# Patient Record
Sex: Male | Born: 1955 | Race: White | Hispanic: No | Marital: Married | Smoking: Never smoker
Health system: Southern US, Community
[De-identification: ages and names within clinical notes are randomized; demographics above are authoritative.]

## PROBLEM LIST (undated history)

## (undated) DIAGNOSIS — D649 Anemia, unspecified: Secondary | ICD-10-CM

## (undated) DIAGNOSIS — I2510 Atherosclerotic heart disease of native coronary artery without angina pectoris: Secondary | ICD-10-CM

## (undated) DIAGNOSIS — E785 Hyperlipidemia, unspecified: Secondary | ICD-10-CM

## (undated) DIAGNOSIS — D510 Vitamin B12 deficiency anemia due to intrinsic factor deficiency: Secondary | ICD-10-CM

## (undated) DIAGNOSIS — R011 Cardiac murmur, unspecified: Secondary | ICD-10-CM

## (undated) DIAGNOSIS — E119 Type 2 diabetes mellitus without complications: Secondary | ICD-10-CM

## (undated) DIAGNOSIS — F988 Other specified behavioral and emotional disorders with onset usually occurring in childhood and adolescence: Secondary | ICD-10-CM

## (undated) DIAGNOSIS — M1990 Unspecified osteoarthritis, unspecified site: Secondary | ICD-10-CM

## (undated) DIAGNOSIS — J45990 Exercise induced bronchospasm: Secondary | ICD-10-CM

## (undated) DIAGNOSIS — E291 Testicular hypofunction: Secondary | ICD-10-CM

## (undated) DIAGNOSIS — R112 Nausea with vomiting, unspecified: Secondary | ICD-10-CM

## (undated) DIAGNOSIS — I10 Essential (primary) hypertension: Secondary | ICD-10-CM

## (undated) DIAGNOSIS — Z9889 Other specified postprocedural states: Secondary | ICD-10-CM

## (undated) DIAGNOSIS — M545 Low back pain, unspecified: Secondary | ICD-10-CM

## (undated) HISTORY — DX: Low back pain: M54.5

## (undated) HISTORY — DX: Other specified behavioral and emotional disorders with onset usually occurring in childhood and adolescence: F98.8

## (undated) HISTORY — DX: Anemia, unspecified: D64.9

## (undated) HISTORY — DX: Unspecified osteoarthritis, unspecified site: M19.90

## (undated) HISTORY — DX: Exercise induced bronchospasm: J45.990

## (undated) HISTORY — DX: Vitamin B12 deficiency anemia due to intrinsic factor deficiency: D51.0

## (undated) HISTORY — DX: Hyperlipidemia, unspecified: E78.5

## (undated) HISTORY — DX: Cardiac murmur, unspecified: R01.1

## (undated) HISTORY — DX: Low back pain, unspecified: M54.50

## (undated) HISTORY — PX: KNEE ARTHROSCOPY: SUR90

## (undated) HISTORY — DX: Testicular hypofunction: E29.1

---

## 2004-05-04 HISTORY — PX: ULNAR NERVE TRANSPOSITION: SHX2595

## 2005-05-04 HISTORY — PX: TENOTOMY ACHILLES TENDON: SUR1337

## 2013-10-10 ENCOUNTER — Emergency Department (HOSPITAL_COMMUNITY)
Admission: EM | Admit: 2013-10-10 | Discharge: 2013-10-10 | Disposition: A | Discharge status: Home / Self Care | Payer: 59 | Attending: Emergency Medicine | Admitting: Emergency Medicine

## 2013-10-10 ENCOUNTER — Encounter (HOSPITAL_COMMUNITY): Payer: Self-pay | Admitting: Emergency Medicine

## 2013-10-10 DIAGNOSIS — M545 Low back pain, unspecified: Secondary | ICD-10-CM

## 2013-10-10 DIAGNOSIS — M5430 Sciatica, unspecified side: Secondary | ICD-10-CM | POA: Insufficient documentation

## 2013-10-10 DIAGNOSIS — Z79899 Other long term (current) drug therapy: Secondary | ICD-10-CM | POA: Insufficient documentation

## 2013-10-10 DIAGNOSIS — E119 Type 2 diabetes mellitus without complications: Secondary | ICD-10-CM | POA: Insufficient documentation

## 2013-10-10 DIAGNOSIS — Z791 Long term (current) use of non-steroidal anti-inflammatories (NSAID): Secondary | ICD-10-CM | POA: Insufficient documentation

## 2013-10-10 HISTORY — DX: Type 2 diabetes mellitus without complications: E11.9

## 2013-10-10 MED ORDER — DEXAMETHASONE SODIUM PHOSPHATE 10 MG/ML IJ SOLN
10.0000 mg | Freq: Once | INTRAMUSCULAR | Status: AC
  Administered 2013-10-10: 10 mg via INTRAMUSCULAR
  Filled 2013-10-10: qty 1

## 2013-10-10 MED ORDER — DIAZEPAM 5 MG PO TABS: 5.0000 mg | ORAL_TABLET | Freq: Two times a day (BID) | ORAL | Status: DC

## 2013-10-10 MED ORDER — OXYCODONE-ACETAMINOPHEN 5-325 MG PO TABS: 1.0000 | ORAL_TABLET | Freq: Four times a day (QID) | ORAL | Status: DC | PRN

## 2013-10-10 MED ORDER — OXYCODONE-ACETAMINOPHEN 5-325 MG PO TABS
2.0000 | ORAL_TABLET | Freq: Once | ORAL | Status: AC
  Administered 2013-10-10: 2 via ORAL
  Filled 2013-10-10: qty 2

## 2013-10-10 MED ORDER — KETOROLAC TROMETHAMINE 60 MG/2ML IM SOLN
60.0000 mg | Freq: Once | INTRAMUSCULAR | Status: AC
  Administered 2013-10-10: 60 mg via INTRAMUSCULAR
  Filled 2013-10-10: qty 2

## 2013-10-10 MED ORDER — DIAZEPAM 5 MG/ML IJ SOLN
2.5000 mg | Freq: Once | INTRAMUSCULAR | Status: AC
  Administered 2013-10-10: 2.5 mg via INTRAMUSCULAR
  Filled 2013-10-10: qty 2

## 2013-10-10 MED ORDER — DIAZEPAM 5 MG/ML IJ SOLN: 2.5000 mg | Freq: Once | INTRAMUSCULAR | Status: DC

## 2013-10-10 MED ORDER — MELOXICAM 7.5 MG PO TABS: 15.0000 mg | ORAL_TABLET | Freq: Every day | ORAL | Status: DC

## 2013-10-10 NOTE — ED Notes (Signed)
Patient is alert and oriented x3.  He was given DC instructions and follow up visit instructions.  Patient gave verbal understanding.  He was DC ambulatory under his own power to home.  V/S stable.  He was not showing any signs of distress on DC 

## 2013-10-10 NOTE — Discharge Instructions (Signed)
Back Pain, Adult Low back pain is very common. About 1 in 5 people have back pain.The cause of low back pain is rarely dangerous. The pain often gets better over time.About half of people with a sudden onset of back pain feel better in just 2 weeks. About 8 in 10 people feel better by 6 weeks.  CAUSES Some common causes of back pain include:  Strain of the muscles or ligaments supporting the spine.  Wear and tear (degeneration) of the spinal discs.  Arthritis.  Direct injury to the back. DIAGNOSIS Most of the time, the direct cause of low back pain is not known.However, back pain can be treated effectively even when the exact cause of the pain is unknown.Answering your caregiver's questions about your overall health and symptoms is one of the most accurate ways to make sure the cause of your pain is not dangerous. If your caregiver needs more information, he or she may order lab work or imaging tests (X-rays or MRIs).However, even if imaging tests show changes in your back, this usually does not require surgery. HOME CARE INSTRUCTIONS For many people, back pain returns.Since low back pain is rarely dangerous, it is often a condition that people can learn to [facility] their own.   Remain active. It is stressful on the back to sit or stand in one place. Do not sit, drive, or stand in one place for more than 30 minutes at a time. Take short walks on level surfaces as soon as pain allows.Try to increase the length of time you walk each day.  Do not stay in bed.Resting more than 1 or 2 days can delay your recovery.  Do not avoid exercise or work.Your body is made to move.It is not dangerous to be active, even though your back may hurt.Your back will likely heal faster if you return to being active before your pain is gone.  Pay attention to your body when you bend and lift. Many people have less discomfortwhen lifting if they bend their knees, keep the load close to their bodies,and  avoid twisting. Often, the most comfortable positions are those that put less stress on your recovering back.  Find a comfortable position to sleep. Use a firm mattress and lie on your side with your knees slightly bent. If you lie on your back, put a pillow under your knees.  Only take over-the-counter or prescription medicines as directed by your caregiver. Over-the-counter medicines to reduce pain and inflammation are often the most helpful.Your caregiver may prescribe muscle relaxant drugs.These medicines help dull your pain so you can more quickly return to your normal activities and healthy exercise.  Put ice on the injured area.  Put ice in a plastic bag.  Place a towel between your skin and the bag.  Leave the ice on for 15-20 minutes, 03-04 times a day for the first 2 to 3 days. After that, ice and heat may be alternated to reduce pain and spasms.  Ask your caregiver about trying back exercises and gentle massage. This may be of some benefit.  Avoid feeling anxious or stressed.Stress increases muscle tension and can worsen back pain.It is important to recognize when you are anxious or stressed and learn ways to manage it.Exercise is a great option. SEEK MEDICAL CARE IF:  You have pain that is not relieved with rest or medicine.  You have pain that does not improve in 1 week.  You have new symptoms.  You are generally not feeling well. SEEK  IMMEDIATE MEDICAL CARE IF:   You have pain that radiates from your back into your legs.  You develop new bowel or bladder control problems.  You have unusual weakness or numbness in your arms or legs.  You develop nausea or vomiting.  You develop abdominal pain.  You feel faint. Document Released: 04/20/2005 Document Revised: 10/20/2011 Document Reviewed: 09/08/2010 [facility] Patient Information 2014 [place], [place]. Sciatica Sciatica is pain, weakness, numbness, or tingling along the path of the sciatic nerve. The nerve  starts in the lower back and runs down the back of each leg. The nerve controls the muscles in the lower leg and in the back of the knee, while also providing sensation to the back of the thigh, lower leg, and the sole of your foot. Sciatica is a symptom of another medical condition. For instance, nerve damage or certain conditions, such as a herniated disk or bone spur on the spine, pinch or put pressure on the sciatic nerve. This causes the pain, weakness, or other sensations normally associated with sciatica. Generally, sciatica only affects one side of the body. CAUSES   Herniated or slipped disc.  Degenerative disk disease.  A pain disorder involving the narrow muscle in the buttocks (piriformis syndrome).  Pelvic injury or fracture.  Pregnancy.  Tumor (rare). SYMPTOMS  Symptoms can vary from mild to very severe. The symptoms usually travel from the low back to the buttocks and down the back of the leg. Symptoms can include:  Mild tingling or dull aches in the lower back, leg, or hip.  Numbness in the back of the calf or sole of the foot.  Burning sensations in the lower back, leg, or hip.  Sharp pains in the lower back, leg, or hip.  Leg weakness.  Severe back pain inhibiting movement. These symptoms may get worse with coughing, sneezing, laughing, or prolonged sitting or standing. Also, being overweight may worsen symptoms. DIAGNOSIS  Your caregiver will perform a physical exam to look for common symptoms of sciatica. He or she may ask you to do certain movements or activities that would trigger sciatic nerve pain. Other tests may be performed to find the cause of the sciatica. These may include:  Blood tests.  X-rays.  Imaging tests, such as an MRI or CT scan. TREATMENT  Treatment is directed at the cause of the sciatic pain. Sometimes, treatment is not necessary and the pain and discomfort goes away on its own. If treatment is needed, your caregiver may  suggest:  Over-the-counter medicines to relieve pain.  Prescription medicines, such as anti-inflammatory medicine, muscle relaxants, or narcotics.  Applying heat or ice to the painful area.  Steroid injections to lessen pain, irritation, and inflammation around the nerve.  Reducing activity during periods of pain.  Exercising and stretching to strengthen your abdomen and improve flexibility of your spine. Your caregiver may suggest losing weight if the extra weight makes the back pain worse.  Physical therapy.  Surgery to eliminate what is pressing or pinching the nerve, such as a bone spur or part of a herniated disk. HOME CARE INSTRUCTIONS   Only take over-the-counter or prescription medicines for pain or discomfort as directed by your caregiver.  Apply ice to the affected area for 20 minutes, 3 4 times a day for the first 48 72 hours. Then try heat in the same way.  Exercise, stretch, or perform your usual activities if these do not aggravate your pain.  Attend physical therapy sessions as directed by your caregiver.  Keep all follow-up appointments as directed by your caregiver.  Do not wear high heels or shoes that do not provide proper support.  Check your mattress to see if it is too soft. A firm mattress may lessen your pain and discomfort. SEEK IMMEDIATE MEDICAL CARE IF:   You lose control of your bowel or bladder (incontinence).  You have increasing weakness in the lower back, pelvis, buttocks, or legs.  You have redness or swelling of your back.  You have a burning sensation when you urinate.  You have pain that gets worse when you lie down or awakens you at night.  Your pain is worse than you have experienced in the past.  Your pain is lasting longer than 4 weeks.  You are suddenly losing weight without reason. MAKE SURE YOU:  Understand these instructions.  Will watch your condition.  Will get help right away if you are not doing well or get  worse. Document Released: 04/14/2001 Document Revised: 10/20/2011 Document Reviewed: 08/30/2011 [facility] Patient Information 2014 [place].

## 2013-10-10 NOTE — ED Provider Notes (Signed)
CSN: [id number]     Arrival date & time 10/10/13  2019 History   First MD Initiated Contact with Patient 10/10/13 2044    This chart was scribed for non-physician practitioner, Antonietta Breach, PA, working with No att. providers found by Terressa Koyanagi, ED Scribe. This patient was seen in room WTR8/WTR8 and the patient's care was started at 9:04 PM.  Chief Complaint  Patient presents with  . Back Pain   The history is provided by the patient. No language interpreter was used.   HPI Comments: Reginald Rios is a 57 y.o. male, with a history of DM, knee arthroscopy, and tenotomy achilles tendon, who presents to the Emergency Department complaining of intermittent lower back pain onset 3 weeks ago, however, worsening today after playing tennis. Pt reports he has been experiencing back pain for the past few weeks and it worsened after playing tennis tonight. Specifically, pt reports that every time he served the ball he experienced shooting, burning pain across his lower back radiating to the left leg. At present, pt complains of lower back pain radiating not only to the left leg, but also to the left hip and tingling in the lower extremities bilaterally. Pt further states that he is unable to walk at present. Pt rates his pain a 8 out of 10.   Pt reports that he has been active for the past three weeks but has refrained from playing tennis until today. Pt reports taking flexeril, otc meds, icing the area (which he reports helps tremendously, stretching and receiving treatment from a chiropractor, without much relief.   Pt denies fevers, loss of bladder control, loss of bowel function, numbness in genital area.    Past Medical History  Diagnosis Date  . Diabetes mellitus without complication    Past Surgical History  Procedure Laterality Date  . Knee arthroscopy    . Tenotomy achilles tendon     No family history on file. History  Substance Use Topics  . Smoking status: Never Smoker   . Smokeless  tobacco: Not on file  . Alcohol Use: Yes     Comment: rare    Review of Systems  Constitutional: Negative for fever.  Gastrointestinal:       Denies loss of control over bowel movements  Genitourinary:       Denies loss of bladder control  Musculoskeletal: Positive for back pain.       Left leg pain; tingling in lower extremities bilaterally; left hip pain   Neurological: Negative for numbness.  All other systems reviewed and are negative.    Allergies  Statins  Home Medications   Prior to Admission medications   Medication Sig Start Date End Date Taking? Authorizing Provider  diazepam (VALIUM) 5 MG tablet Take 1 tablet (5 mg total) by mouth 2 (two) times daily. 10/10/13   Antonietta Breach, PA-C  meloxicam (MOBIC) 7.5 MG tablet Take 2 tablets (15 mg total) by mouth daily. 10/10/13   Antonietta Breach, PA-C  oxyCODONE-acetaminophen (PERCOCET/ROXICET) 5-325 MG per tablet Take 1-2 tablets by mouth every 6 (six) hours as needed for severe pain. May take 2 tablets PO q 6 hours for severe pain - Do not take with Tylenol as this tablet already contains tylenol 10/10/13   Antonietta Breach, PA-C   Triage Vitals: BP 136/82  Pulse 82  Temp(Src) 98.5 F (36.9 C) (Oral)  Resp 16  SpO2 97%  Physical Exam  Nursing note and vitals reviewed. Constitutional: He is oriented to person, place, and  time. He appears well-developed and well-nourished. No distress.  Nontoxic/nonseptic appearing  HENT:  Head: Normocephalic and atraumatic.  Eyes: Conjunctivae and EOM are normal. No scleral icterus.  Neck: Normal range of motion.  Cardiovascular: Normal rate, regular rhythm and intact distal pulses.   DP and PT pulses 2+ bilaterally  Pulmonary/Chest: Effort normal. No respiratory distress.  Musculoskeletal:  Decreased range of motion of the back secondary to pain. Patient has tenderness to palpation of the right lumbar paraspinal muscles at approximately L4-L5. Negative straight leg raising and crossed straight-leg  raise. No bony deformities or step-offs palpated.  Neurological: He is alert and oriented to person, place, and time. Coordination normal.  No gross sensory deficits appreciated. Patellar and Achilles reflexes intact bilaterally. Patient ambulates with normal gait.  Skin: Skin is warm and dry. No rash noted. He is not diaphoretic. No erythema. No pallor.  Psychiatric: He has a normal mood and affect. His behavior is normal.    ED Course  Procedures (including critical care time) DIAGNOSTIC STUDIES: Oxygen Saturation is 97% on RA, normal by my interpretation.    COORDINATION OF CARE: 9:14 PM-Discussed treatment plan which includes meds, reasons for why imaging is not necessary at this time, ortho referral with pt. Patient verbalizes understanding and agrees with treatment plan.   Labs Review Labs Reviewed - No data to display  Imaging Review No results found.   EKG Interpretation None      MDM   Final diagnoses:  Low back pain  Sciatica    Patient with back pain. Back pain worsening after playing tennis today. Patient neurovascularly intact. No gross sensory deficits appreciated. No loss of bowel or bladder control. No concern for cauda equina. Patient ambulatory with normal gait, though he states that ambulation causes some back discomfort. Patient with significant improvement over ED course with Decadron, Toradol, and Valium. He is stable for discharge with instructions for RICE protocol. Pain medicine indicated and discussed with patient. Will refer to orthopedics. Return precautions discussed and provided. Patient agreeable to plan with no unaddressed concerns.  I personally performed the services described in this documentation, which was scribed in my presence. The recorded information has been reviewed and is accurate.   Filed Vitals:   10/10/13 2032 10/10/13 2220  BP: 136/82 135/66  Pulse: 82 62  Temp: 98.5 F (36.9 C)   TempSrc: Oral   Resp: 16   SpO2: 97% 97%       Antonietta Breach, PA-C 10/10/13 2238

## 2013-10-10 NOTE — ED Notes (Signed)
Pt reports L back pain, was playing tennis today.  Pt reports pain radiates down to his L leg.

## 2013-10-13 ENCOUNTER — Other Ambulatory Visit (HOSPITAL_COMMUNITY): Payer: Self-pay | Admitting: Orthopaedic Surgery

## 2013-10-13 DIAGNOSIS — M545 Low back pain, unspecified: Secondary | ICD-10-CM

## 2013-10-15 NOTE — ED Provider Notes (Signed)
Medical screening examination/treatment/procedure(s) were performed by non-physician practitioner and as supervising physician I was immediately available for consultation/collaboration.   EKG Interpretation None       Virgel Manifold, MD 10/15/13 [phone]

## 2013-10-23 ENCOUNTER — Ambulatory Visit (HOSPITAL_COMMUNITY)
Admission: RE | Admit: 2013-10-23 | Discharge: 2013-10-23 | Disposition: A | Discharge status: Home / Self Care | Payer: 59 | Source: Ambulatory Visit | Attending: Orthopaedic Surgery | Admitting: Orthopaedic Surgery

## 2013-10-23 DIAGNOSIS — M545 Low back pain, unspecified: Secondary | ICD-10-CM

## 2013-10-23 DIAGNOSIS — M48062 Spinal stenosis, lumbar region with neurogenic claudication: Secondary | ICD-10-CM | POA: Insufficient documentation

## 2013-10-23 DIAGNOSIS — M5106 Intervertebral disc disorders with myelopathy, lumbar region: Secondary | ICD-10-CM | POA: Insufficient documentation

## 2013-12-20 ENCOUNTER — Ambulatory Visit: Payer: Self-pay | Admitting: Internal Medicine

## 2013-12-22 ENCOUNTER — Ambulatory Visit (INDEPENDENT_AMBULATORY_CARE_PROVIDER_SITE_OTHER): Payer: 59 | Admitting: Internal Medicine

## 2013-12-22 ENCOUNTER — Encounter: Payer: Self-pay | Admitting: Internal Medicine

## 2013-12-22 VITALS — BP 104/60 | HR 72 | Temp 98.5°F | Ht 69.0 in | Wt 189.0 lb

## 2013-12-22 DIAGNOSIS — E1165 Type 2 diabetes mellitus with hyperglycemia: Secondary | ICD-10-CM | POA: Insufficient documentation

## 2013-12-22 DIAGNOSIS — M19042 Primary osteoarthritis, left hand: Secondary | ICD-10-CM

## 2013-12-22 DIAGNOSIS — IMO0001 Reserved for inherently not codable concepts without codable children: Secondary | ICD-10-CM | POA: Diagnosis not present

## 2013-12-22 DIAGNOSIS — M19049 Primary osteoarthritis, unspecified hand: Secondary | ICD-10-CM | POA: Diagnosis not present

## 2013-12-22 DIAGNOSIS — E119 Type 2 diabetes mellitus without complications: Secondary | ICD-10-CM | POA: Insufficient documentation

## 2013-12-22 DIAGNOSIS — E291 Testicular hypofunction: Secondary | ICD-10-CM

## 2013-12-22 DIAGNOSIS — Z23 Encounter for immunization: Secondary | ICD-10-CM

## 2013-12-22 HISTORY — DX: Type 2 diabetes mellitus with hyperglycemia: E11.65

## 2013-12-22 HISTORY — DX: Primary osteoarthritis, left hand: M19.042

## 2013-12-22 MED ORDER — SITAGLIPTIN PHOS-METFORMIN HCL 50-1000 MG PO TABS: 1.0000 | ORAL_TABLET | Freq: Two times a day (BID) | ORAL | Status: DC

## 2013-12-22 NOTE — Assessment & Plan Note (Addendum)
Patient has been diabetic for 4 years. His last A1c reported at 7.6. We discussed pros and cons of various treatments for type 2 diabetes.  Monitor A1c. Check microalbumin to Cr ratio. His diabetic foot exam is unremarkable. Obtain copy of previous diabetic eye exam. Patient updated with flu vaccine and Pneumovax.  Patient has history of hypogonadism. Obtain iron studies to screen for hemachromatosis.  Obtain screening lipid panel.

## 2013-12-22 NOTE — Patient Instructions (Signed)
Please ask your eye doctor to fax copy of your last eye exam Taper off your testosterone gel as directed.

## 2013-12-22 NOTE — Assessment & Plan Note (Signed)
Patient diagnosed with hypogonadism one to 2 years ago. He is currently taking testosterone gel replacement. He discussed risks and benefits of testosterone replacement. Patient advised to slowly taper off his testosterone replacement.  Monitor free testosterone and PSA levels

## 2013-12-22 NOTE — Progress Notes (Addendum)
Subjective:    Patient ID: Reginald Rios, male    DOB: 03-20-1956, 58 y.o.   MRN: [id number]  HPI  58 year old white male to establish. Patient recently moved to [place] area from [place]. He has history of type 2 diabetes diagnosed 4 years ago. He is taking combination of Janumet 50/1000 mg twice daily, Starlix 120 mg 3 times a day and pioglitazone 15 mg once daily. He reports it has been approximately 3-4 months since his last A1c. Last A1c 7.6. He denies any history of microvascular complications of type 2 diabetes.  He denies any cardiac history. However he has strong family history of coronary artery disease. His father died at age 17 from myocardial infarction.  He denies history of hyperlipidemia.  Type 2 diabetes-patient reports he does a fair job of following low-carb diet. He does not drink sugary beverages. He does not always take Starlix on regular basis. He exercises on a regular basis and stays fairly active.  Patient has been treated for hypogonadism for the last 1 to 2 years.  He reports his testosterone level in the mid 200's.  He currently takes testosterone gel 50 mg once daily. Patient reports missing 2 days of testosterone gel and experiencing significant fatigue.  Review of Systems  Constitutional: Negative for activity change, appetite change and unexpected weight change.   Lost 17 lbs since diabetes diagnosis 4 yrs ago Eyes: Negative for visual disturbance.  His last eye exam 8 months ago (negative for diabetic retinopathy) Respiratory: Negative for cough, chest tightness and shortness of breath.   Cardiovascular: Negative for chest pain.  Genitourinary: Negative for difficulty urinating.  Neurological: Negative for headaches.  Gastrointestinal: Negative for abdominal pain, heartburn melena or hematochezia.  No previous colonoscopy Psych: Negative for depression or anxiety Endo:  No polyuria or polydypsia    Past Medical History  Diagnosis Date  .  Diabetes mellitus without complication   . Exercise-induced asthma   . Arthritis   . Osteoarthritis   . Hypogonadism in male   . Low back pain     History   Social History  . Marital Status: Married    Spouse Name: N/A    Number of Children: N/A  . Years of Education: N/A   Occupational History  . Not on file.   Social History Main Topics  . Smoking status: Never Smoker   . Smokeless tobacco: Not on file  . Alcohol Use: Yes     Comment: rare  . Drug Use: No  . Sexual Activity: Not on file   Other Topics Concern  . Not on file   Social History Narrative   Previously worked as Geophysicist/field seismologist   Currently working at [employer]   Married 7 years   2 children ages 20, 5   Wife is psychiatrist - Dr. Margurite Auerbach   Moved from [place] up in [place]    Past Surgical History  Procedure Laterality Date  . Knee arthroscopy    . Tenotomy achilles tendon      Family History  Problem Relation Age of Onset  . Lung cancer Mother 4  . Coronary artery disease Father 76  . Hypothyroidism Brother   . Colon cancer Neg Hx   . Prostate cancer Neg Hx     Allergies  Allergen Reactions  . Statins     No current outpatient prescriptions on file prior to visit.   No current facility-administered medications on file prior to visit.  BP 104/60  Pulse 72  Temp(Src) 98.5 F (36.9 C) (Oral)  Ht 5\' 9"  (1.753 m)  Wt 189 lb (85.73 kg)  BMI 27.90 kg/m2      Objective:   Physical Exam  Constitutional: He is oriented to person, place, and time. He appears well-developed and well-nourished. No distress.  HENT:  Head: Normocephalic and atraumatic.  Right Ear: External ear normal.  Left Ear: External ear normal.  Mouth/Throat: Oropharynx is clear and moist.  Eyes: Conjunctivae and EOM are normal. Pupils are equal, round, and reactive to light.  Neck: Neck supple.  No carotid bruit  Cardiovascular: Normal rate, regular rhythm, normal heart sounds and intact  distal pulses.   No murmur heard. Pulmonary/Chest: Effort normal and breath sounds normal. He has no wheezes.  Abdominal: Soft. Bowel sounds are normal. He exhibits no mass. There is no tenderness.  Musculoskeletal: Normal range of motion. He exhibits no edema.  Lymphadenopathy:    He has no cervical adenopathy.  Neurological: He is oriented to person, place, and time. No cranial nerve deficit.  Skin: Skin is warm and dry.  Psychiatric: He has a normal mood and affect. His behavior is normal.          Assessment & Plan:

## 2013-12-23 LAB — BASIC METABOLIC PANEL
BUN: 19 mg/dL (ref 6–23)
CO2: 26 mEq/L (ref 19–32)
Calcium: 9.2 mg/dL (ref 8.4–10.5)
Chloride: 105 mEq/L (ref 96–112)
Creatinine, Ser: 0.8 mg/dL (ref 0.4–1.5)
GFR: 103.1 mL/min (ref >60.00)
Glucose, Bld: 90 mg/dL (ref 70–99)
Potassium: 4.2 mEq/L (ref 3.5–5.1)
Sodium: 138 mEq/L (ref 135–145)

## 2013-12-23 LAB — CBC WITH DIFFERENTIAL/PLATELET
Basophils Absolute: 0.1 10*3/uL (ref 0.0–0.1)
Basophils Relative: 1.1 % (ref 0.0–3.0)
Eosinophils Absolute: 0.1 10*3/uL (ref 0.0–0.7)
Eosinophils Relative: 1 % (ref 0.0–5.0)
HCT: 37.6 % — ABNORMAL LOW (ref 39.0–52.0)
Hemoglobin: 12.6 g/dL — ABNORMAL LOW (ref 13.0–17.0)
Lymphocytes Relative: 35.9 % (ref 12.0–46.0)
Lymphs Abs: 2.4 10*3/uL (ref 0.7–4.0)
MCHC: 33.4 g/dL (ref 30.0–36.0)
MCV: 97.5 fl (ref 78.0–100.0)
Monocytes Absolute: 0.4 10*3/uL (ref 0.1–1.0)
Monocytes Relative: 6.2 % (ref 3.0–12.0)
Neutro Abs: 3.7 10*3/uL (ref 1.4–7.7)
Neutrophils Relative %: 55.8 % (ref 43.0–77.0)
Platelets: 234 10*3/uL (ref 150.0–400.0)
RBC: 3.86 Mil/uL — ABNORMAL LOW (ref 4.22–5.81)
RDW: 14.4 % (ref 11.5–15.5)
WBC: 6.7 10*3/uL (ref 4.0–10.5)

## 2013-12-23 LAB — IBC PANEL
Iron: 68 ug/dL (ref 42–165)
Saturation Ratios: 18 % — ABNORMAL LOW (ref 20.0–50.0)
Transferrin: 269.7 mg/dL (ref 212.0–360.0)

## 2013-12-23 LAB — MICROALBUMIN / CREATININE URINE RATIO
Creatinine,U: 206.8 mg/dL
Microalb Creat Ratio: 1.4 mg/g (ref 0.0–30.0)
Microalb, Ur: 2.9 mg/dL — ABNORMAL HIGH (ref 0.0–1.9)

## 2013-12-23 LAB — HEPATIC FUNCTION PANEL
ALT: 20 U/L (ref 0–53)
AST: 25 U/L (ref 0–37)
Albumin: 4.1 g/dL (ref 3.5–5.2)
Alkaline Phosphatase: 66 U/L (ref 39–117)
Bilirubin, Direct: 0.1 mg/dL (ref 0.0–0.3)
Total Bilirubin: 0.7 mg/dL (ref 0.2–1.2)
Total Protein: 7 g/dL (ref 6.0–8.3)

## 2013-12-23 LAB — LIPID PANEL
Cholesterol: 159 mg/dL (ref 0–200)
HDL: 51.8 mg/dL (ref >39.00)
LDL Cholesterol: 95 mg/dL (ref 0–99)
NonHDL: 107.2
Total CHOL/HDL Ratio: 3
Triglycerides: 62 mg/dL (ref 0.0–149.0)
VLDL: 12.4 mg/dL (ref 0.0–40.0)

## 2013-12-23 LAB — FERRITIN: Ferritin: 44.3 ng/mL (ref 22.0–322.0)

## 2013-12-23 LAB — PSA: PSA: 1.26 ng/mL (ref 0.10–4.00)

## 2013-12-23 LAB — TSH: TSH: 1.44 u[IU]/mL (ref 0.35–4.50)

## 2013-12-23 LAB — HEMOGLOBIN A1C: Hgb A1c MFr Bld: 7.1 % — ABNORMAL HIGH (ref 4.6–6.5)

## 2013-12-23 LAB — HIGH SENSITIVITY CRP: CRP, High Sensitivity: 2.42 mg/L (ref 0.000–5.000)

## 2013-12-24 LAB — TESTOSTERONE, FREE, TOTAL, SHBG
Sex Hormone Binding: 40 nmol/L (ref 13–71)
Testosterone, Free: 59.9 pg/mL (ref 47.0–244.0)
Testosterone-% Free: 1.8 % (ref 1.6–2.9)
Testosterone: 339 ng/dL (ref 300–890)

## 2014-01-19 ENCOUNTER — Encounter: Payer: Self-pay | Admitting: Internal Medicine

## 2014-01-19 ENCOUNTER — Ambulatory Visit (INDEPENDENT_AMBULATORY_CARE_PROVIDER_SITE_OTHER): Payer: 59 | Admitting: Internal Medicine

## 2014-01-19 VITALS — BP 120/70 | Temp 98.6°F | Wt 188.0 lb

## 2014-01-19 DIAGNOSIS — M19049 Primary osteoarthritis, unspecified hand: Secondary | ICD-10-CM

## 2014-01-19 DIAGNOSIS — D649 Anemia, unspecified: Secondary | ICD-10-CM

## 2014-01-19 DIAGNOSIS — M79609 Pain in unspecified limb: Secondary | ICD-10-CM

## 2014-01-19 DIAGNOSIS — E785 Hyperlipidemia, unspecified: Secondary | ICD-10-CM

## 2014-01-19 DIAGNOSIS — E291 Testicular hypofunction: Secondary | ICD-10-CM

## 2014-01-19 DIAGNOSIS — M79642 Pain in left hand: Secondary | ICD-10-CM

## 2014-01-19 DIAGNOSIS — F988 Other specified behavioral and emotional disorders with onset usually occurring in childhood and adolescence: Secondary | ICD-10-CM

## 2014-01-19 DIAGNOSIS — E1165 Type 2 diabetes mellitus with hyperglycemia: Secondary | ICD-10-CM

## 2014-01-19 DIAGNOSIS — M19042 Primary osteoarthritis, left hand: Secondary | ICD-10-CM

## 2014-01-19 DIAGNOSIS — IMO0001 Reserved for inherently not codable concepts without codable children: Secondary | ICD-10-CM

## 2014-01-19 HISTORY — DX: Other specified behavioral and emotional disorders with onset usually occurring in childhood and adolescence: F98.8

## 2014-01-19 HISTORY — DX: Anemia, unspecified: D64.9

## 2014-01-19 MED ORDER — PITAVASTATIN CALCIUM 2 MG PO TABS: 2.0000 mg | ORAL_TABLET | Freq: Every day | ORAL | Status: DC

## 2014-01-19 MED ORDER — METHYLPHENIDATE HCL 20 MG PO TABS: 20.0000 mg | ORAL_TABLET | Freq: Three times a day (TID) | ORAL | Status: DC

## 2014-01-19 MED ORDER — TESTOSTERONE 50 MG/5GM (1%) TD GEL: 5.0000 g | Freq: Every day | TRANSDERMAL | Status: DC

## 2014-01-19 NOTE — Assessment & Plan Note (Signed)
Patient's A1c is slightly above 7. We discussed optimizing dietary compliance versus adding another medication. Lab Results  Component Value Date   HGBA1C 7.1* 12/22/2013   Lab Results  Component Value Date   MICROALBUR 2.9* 12/22/2013   LDLCALC 95 12/22/2013   CREATININE 0.8 12/22/2013

## 2014-01-19 NOTE — Progress Notes (Signed)
Pre visit review using our clinic review tool, if applicable. No additional management support is needed unless otherwise documented below in the visit note. 

## 2014-01-19 NOTE — Assessment & Plan Note (Signed)
Patient complains of chronic left hand pain.  No improvement with NSAID use.  Refer to hand specialist for further evaluation.

## 2014-01-19 NOTE — Progress Notes (Signed)
Subjective:    Patient ID: Reginald Rios, male    DOB: 09/25/1955, 58 y.o.   MRN: [id number]  HPI  58 year old white male for followup regarding type 2 diabetes, mild normocytic anemia and attention deficit disorder.  Previous blood work showed mild normocytic anemia. His meds are index not suggestive of hemoglobinopathy. His iron saturation was slightly low. He has never had colon cancer screening.  Patient reports history of ADD. Medication (Ritalin) started by his previous primary care physician. Patient had difficulty with multitasking and focusing at work. Patient tolerating Ritalin without difficulty.  Hypogonadism-patient try to wean off testosterone as directed to experience significant fatigue within 2 days. Patient resumed taking previous dose of testosterone.  DM II - he exercises regularly but he is not restricting carbohydrate intake.  Review of Systems Negative for chest pain or shortness of breath  Past Medical History  Diagnosis Date  . Diabetes mellitus without complication   . Exercise-induced asthma   . Arthritis   . Osteoarthritis   . Hypogonadism in male   . Low back pain   . ADD (attention deficit disorder)     History   Social History  . Marital Status: Married    Spouse Name: N/A    Number of Children: N/A  . Years of Education: N/A   Occupational History  . Not on file.   Social History Main Topics  . Smoking status: Never Smoker   . Smokeless tobacco: Not on file  . Alcohol Use: Yes     Comment: rare  . Drug Use: No  . Sexual Activity: Not on file   Other Topics Concern  . Not on file   Social History Narrative   Previously worked as Geophysicist/field seismologist   Currently working at [employer]   Married 50 years   2 children ages 69, 8   Wife is psychiatrist - Dr. Margurite Auerbach   Moved from [place] up in [place]    Past Surgical History  Procedure Laterality Date  . Knee arthroscopy    . Tenotomy achilles tendon       Family History  Problem Relation Age of Onset  . Lung cancer Mother 52  . Coronary artery disease Father 52  . Hypothyroidism Brother   . Colon cancer Neg Hx   . Prostate cancer Neg Hx     Allergies  Allergen Reactions  . Statins     Current Outpatient Prescriptions on File Prior to Visit  Medication Sig Dispense Refill  . aspirin 81 MG tablet Take 81 mg by mouth daily.      . Diclofenac Potassium (ZIPSOR) 25 MG CAPS Take 1 capsule by mouth 3 (three) times daily.      . diclofenac sodium (VOLTAREN) 1 % GEL Apply 2 g topically 4 (four) times daily.      . montelukast (SINGULAIR) 10 MG tablet Take 1 tablet by mouth daily.      . nateglinide (STARLIX) 120 MG tablet Take 120 mg by mouth 3 (three) times daily with meals.      . sitaGLIPtin-metformin (JANUMET) 50-1000 MG per tablet Take 1 tablet by mouth 2 (two) times daily with a meal.  180 tablet  1   No current facility-administered medications on file prior to visit.    BP 120/70  Temp(Src) 98.6 F (37 C) (Oral)  Wt 188 lb (85.276 kg)       Objective:   Physical Exam  Constitutional: He is oriented to person, place,  and time. He appears well-developed and well-nourished. No distress.  HENT:  Head: Normocephalic and atraumatic.  Cardiovascular: Normal rate, regular rhythm and normal heart sounds.   No murmur heard. Pulmonary/Chest: Effort normal and breath sounds normal. He has no wheezes.  Neurological: He is alert and oriented to person, place, and time. No cranial nerve deficit.  Skin: Skin is warm and dry.  Psychiatric: He has a normal mood and affect. His behavior is normal.          Assessment & Plan:

## 2014-01-19 NOTE — Assessment & Plan Note (Signed)
He reports long hx of ADD.  Stable on generic ritalin.  BP is normal. BP: 120/70 mmHg  Continue same dose.

## 2014-01-19 NOTE — Patient Instructions (Signed)
Please complete the following lab tests before your next follow up appointment: BMET, A1c - 250.02 CBCD - 285.9 FLP, LFTs - 272.4

## 2014-01-19 NOTE — Assessment & Plan Note (Addendum)
Patient try but unable to taper off his testosterone replacement. His PSA is stable. His testosterone is within normal limits. Continue same dose of AndroGel for now.   Lab Results  Component Value Date   PSA 1.26 12/22/2013    Lab Results  Component Value Date   TESTOSTERONE 339 12/22/2013

## 2014-01-19 NOTE — Assessment & Plan Note (Signed)
Patient has history of poor tolerance to statins. He experiences musculoskeletal complaints. Trial of Livalo 2 mg.

## 2014-01-19 NOTE — Assessment & Plan Note (Addendum)
58 year old with mild normocytic anemia. He has low iron saturation. Refer to GI for further evaluation. Patient may need both colonoscopy and endoscopy.  Lab Results  Component Value Date   WBC 6.7 12/22/2013   HGB 12.6* 12/22/2013   HCT 37.6* 12/22/2013   MCV 97.5 12/22/2013   PLT 234.0 12/22/2013

## 2014-02-19 ENCOUNTER — Encounter: Payer: Self-pay | Admitting: Internal Medicine

## 2014-02-22 ENCOUNTER — Encounter: Payer: Self-pay | Admitting: Gastroenterology

## 2014-03-07 ENCOUNTER — Ambulatory Visit (AMBULATORY_SURGERY_CENTER): Payer: Self-pay | Admitting: *Deleted

## 2014-03-07 VITALS — Ht 70.0 in | Wt 193.8 lb

## 2014-03-07 DIAGNOSIS — Z1211 Encounter for screening for malignant neoplasm of colon: Secondary | ICD-10-CM

## 2014-03-07 MED ORDER — MOVIPREP 100 G PO SOLR: 1.0000 | Freq: Once | ORAL | Status: DC

## 2014-03-07 NOTE — Progress Notes (Signed)
No egg or soy allergy. ewm No home 02 use. No blood thinners. ewm No issues with past sedation. ewm No diet pills. ewm emmi video to pt's e mail. ewm

## 2014-03-21 ENCOUNTER — Ambulatory Visit (AMBULATORY_SURGERY_CENTER): Payer: 59 | Admitting: Gastroenterology

## 2014-03-21 ENCOUNTER — Encounter: Payer: Self-pay | Admitting: Gastroenterology

## 2014-03-21 VITALS — BP 139/78 | HR 54 | Temp 95.4°F | Resp 11 | Ht 70.0 in | Wt 193.0 lb

## 2014-03-21 DIAGNOSIS — Z1211 Encounter for screening for malignant neoplasm of colon: Secondary | ICD-10-CM

## 2014-03-21 MED ORDER — SODIUM CHLORIDE 0.9 % IV SOLN: 500.0000 mL | INTRAVENOUS | Status: DC

## 2014-03-21 NOTE — Op Note (Signed)
[facility]  [address] [place], [id number]   COLONOSCOPY PROCEDURE REPORT  PATIENT: Reginald, Rios  MR#: [id number] BIRTHDATE: 1955-12-18 , 58  yrs. old GENDER: male ENDOSCOPIST: Milus Banister, MD REFERRED CN:[id number] [name], DO PROCEDURE DATE:  03/21/2014 PROCEDURE:   Colonoscopy, screening First Screening Colonoscopy - Avg.  risk and is 50 yrs.  old or older Yes.  Prior Negative Screening - Now for repeat screening. N/A  History of Adenoma - Now for follow-up colonoscopy & has been > or = to 3 yrs.  N/A  Polyps Removed Today? No.  Recommend repeat exam, <10 yrs? No. ASA CLASS:   Class II INDICATIONS:average risk for colon cancer. MEDICATIONS: Monitored anesthesia care and Propofol 200 mg IV  DESCRIPTION OF PROCEDURE:   After the risks benefits and alternatives of the procedure were thoroughly explained, informed consent was obtained.  The digital rectal exam revealed no abnormalities of the rectum.   The LB [id number]  endoscope was introduced through the anus and advanced to the cecum, which was identified by both the appendix and ileocecal valve. No adverse events experienced.   The quality of the prep was excellent.  The instrument was then slowly withdrawn as the colon was fully examined.   COLON FINDINGS: A normal appearing cecum, ileocecal valve, and appendiceal orifice were identified.  The ascending, transverse, descending, sigmoid colon, and rectum appeared unremarkable. Retroflexed views revealed no abnormalities. The time to cecum=3 minutes 23 seconds.  Withdrawal time=6 minutes 35 seconds.  The scope was withdrawn and the procedure completed. COMPLICATIONS: There were no immediate complications.  ENDOSCOPIC IMPRESSION: Normal colonoscopy No polyps or cancers  RECOMMENDATIONS: You should continue to follow colorectal cancer screening guidelines for "routine risk" patients with a repeat colonoscopy in 10 years. There is no need for  FOBT (stool) testing for at least 5 years.  eSigned:  Milus Banister, MD 03/21/2014 10:43 AM

## 2014-03-21 NOTE — Progress Notes (Signed)
Report to PACU, RN, vss, BBS= Clear.  

## 2014-03-21 NOTE — Patient Instructions (Signed)
Discharge instructions given. Normal exam. Resume previous medications. YOU HAD AN ENDOSCOPIC PROCEDURE TODAY AT THE [facility]: Refer to the procedure report that was given to you for any specific questions about what was found during the examination.  If the procedure report does not answer your questions, please call your gastroenterologist to clarify.  If you requested that your care partner not be given the details of your procedure findings, then the procedure report has been included in a sealed envelope for you to review at your convenience later.  YOU SHOULD EXPECT: Some feelings of bloating in the abdomen. Passage of more gas than usual.  Walking can help get rid of the air that was put into your GI tract during the procedure and reduce the bloating. If you had a lower endoscopy (such as a colonoscopy or flexible sigmoidoscopy) you may notice spotting of blood in your stool or on the toilet paper. If you underwent a bowel prep for your procedure, then you may not have a normal bowel movement for a few days.  DIET: Your first meal following the procedure should be a light meal and then it is ok to progress to your normal diet.  A half-sandwich or bowl of soup is an example of a good first meal.  Heavy or fried foods are harder to digest and may make you feel nauseous or bloated.  Likewise meals heavy in dairy and vegetables can cause extra gas to form and this can also increase the bloating.  Drink plenty of fluids but you should avoid alcoholic beverages for 24 hours.  ACTIVITY: Your care partner should take you home directly after the procedure.  You should plan to take it easy, moving slowly for the rest of the day.  You can resume normal activity the day after the procedure however you should NOT DRIVE or use heavy machinery for 24 hours (because of the sedation medicines used during the test).    SYMPTOMS TO REPORT IMMEDIATELY: A gastroenterologist can be reached at any hour.   During normal business hours, 8:30 AM to 5:00 PM Monday through Friday, call [phone].  After hours and on weekends, please call the GI answering service at [phone] who will take a message and have the physician on call contact you.   Following lower endoscopy (colonoscopy or flexible sigmoidoscopy):  Excessive amounts of blood in the stool  Significant tenderness or worsening of abdominal pains  Swelling of the abdomen that is new, acute  Fever of 100F or higher  FOLLOW UP: If any biopsies were taken you will be contacted by phone or by letter within the next 1-3 weeks.  Call your gastroenterologist if you have not heard about the biopsies in 3 weeks.  Our staff will call the home number listed on your records the next business day following your procedure to check on you and address any questions or concerns that you may have at that time regarding the information given to you following your procedure. This is a courtesy call and so if there is no answer at the home number and we have not heard from you through the emergency physician on call, we will assume that you have returned to your regular daily activities without incident.  SIGNATURES/CONFIDENTIALITY: You and/or your care partner have signed paperwork which will be entered into your electronic medical record.  These signatures attest to the fact that that the information above on your After Visit Summary has been reviewed and is understood.    Full responsibility of the confidentiality of this discharge information lies with you and/or your care-partner.

## 2014-03-22 ENCOUNTER — Telehealth: Payer: Self-pay | Admitting: *Deleted

## 2014-03-22 NOTE — Telephone Encounter (Signed)
  Follow up Call-  Call back number 03/21/2014  Post procedure Call Back phone  # [phone] cell  Permission to leave phone message Yes     Patient questions:  Do you have a fever, pain , or abdominal swelling? No. Pain Score  0 *  Have you tolerated food without any problems? Yes.    Have you been able to return to your normal activities? Yes.    Do you have any questions about your discharge instructions: Diet   No. Medications  No. Follow up visit  No.  Do you have questions or concerns about your Care? No.  Actions: * If pain score is 4 or above: No action needed, pain <4. Patient stating he still has some bloating. Encouraged walking and warm fluids. Patient verbalized understanding and agreement.

## 2014-04-11 ENCOUNTER — Other Ambulatory Visit: Payer: Self-pay | Admitting: *Deleted

## 2014-04-11 MED ORDER — TESTOSTERONE 50 MG/5GM (1%) TD GEL: 5.0000 g | Freq: Every day | TRANSDERMAL | Status: DC

## 2014-04-17 ENCOUNTER — Ambulatory Visit: Payer: 59 | Admitting: Internal Medicine

## 2014-05-07 ENCOUNTER — Telehealth: Payer: Self-pay | Admitting: Internal Medicine

## 2014-05-07 DIAGNOSIS — M19042 Primary osteoarthritis, left hand: Secondary | ICD-10-CM

## 2014-05-07 NOTE — Telephone Encounter (Signed)
Pt had appt in dec. Cancelled by provider.  Pt forgot to resc.  Now pt will be out of meds: methylphenidate (RITALIN) 20 MG tablet testosterone (ANDROGEL) 50 MG/5GM (1%) GEL Pitavastatin Calcium (LIVALO) 2 MG TABS  Do I needs to sch w/ another provider, or can he get enough to get him through until 06/01/14  (Appt sche then)  [place] outpt pharm

## 2014-05-07 NOTE — Telephone Encounter (Signed)
[place] for referral to rheumatology.  Refer to Dr. Charlestine Night

## 2014-05-07 NOTE — Telephone Encounter (Signed)
Referral order placed.

## 2014-05-07 NOTE — Telephone Encounter (Signed)
Pt would like referral to a rheumatologist.  Pt has seen ortho doc and that did not help. Pt's left hand is swollen all the time, joints ache, pt can barely close hand right now.

## 2014-05-07 NOTE — Telephone Encounter (Signed)
Please advise if ok for refill or if pt needs to be seen by another provider

## 2014-05-07 NOTE — Telephone Encounter (Signed)
Ok to RF x 1

## 2014-05-07 NOTE — Telephone Encounter (Signed)
Please advise if ok for referral 

## 2014-05-08 MED ORDER — PITAVASTATIN CALCIUM 2 MG PO TABS: 2.0000 mg | ORAL_TABLET | Freq: Every day | ORAL | Status: DC

## 2014-05-08 MED ORDER — METHYLPHENIDATE HCL 20 MG PO TABS: 20.0000 mg | ORAL_TABLET | Freq: Three times a day (TID) | ORAL | Status: DC

## 2014-05-08 MED ORDER — TESTOSTERONE 50 MG/5GM (1%) TD GEL: 5.0000 g | Freq: Every day | TRANSDERMAL | Status: DC

## 2014-05-08 NOTE — Telephone Encounter (Signed)
Pt had initial visit in August.  At that time he said he was using 2 tubes daily.  We tried to taper but he could not handle lower dose.  Please confirm with patient.  If yes, he can resume using 2 tubes daily.

## 2014-05-08 NOTE — Telephone Encounter (Signed)
[name] to clarify directions of the androgel.  We have pt taking it 5g daily which is 1 tube daily and they said there is an old rx written by Dr Traci Sermon that states apply 1 tube to each arm daily.  So the patient is using 2 tubes daily.  Reginald Rios at [facility] said the directions were changed when Dr [name] took over refills.  Could you clarify if you want the pt to use 1 or 2 tubes daily?  Thanks

## 2014-05-08 NOTE — Telephone Encounter (Signed)
Dr Sherren Mocha signed the Ritalin rx, up front for pick up, others called in to [facility].  Pt aware

## 2014-05-09 NOTE — Telephone Encounter (Signed)
Left a message for return call.  

## 2014-05-10 NOTE — Telephone Encounter (Signed)
If pt can manage with 1 tube daily, then continue same dose.  If he would like to raise dose, then call in 2 tubes.  If he chooses higher dose, I recommend he return in 2 months for repeat testosterone level (obtain level within 2-4 hrs of using androgel) and CBC

## 2014-05-10 NOTE — Telephone Encounter (Signed)
Spoke with pt and pt states he has only taken 1 tube daily and going off of this has been difficult because in the afternoon pt does not have any energy and it is effecting his job and he does not want to do anything. Pt states he as already picked up the prescription from the pharmacy- the reason the pharmacy was contacting us was because they thought the patient needed double the amount.  Pls advise.  Pt is aware Dr.Yoo is out of the office until Monday and he will in [place] until Tuesday. Ok to leave a vm on cell phone.

## 2014-05-10 NOTE — Telephone Encounter (Signed)
Left a message for return call.  

## 2014-05-14 NOTE — Telephone Encounter (Signed)
Called and spoke with pt and pt states he does not know where the 2 tubes came from but he wants to stay on the 1 tube.  Verified pt's upcoming appt on 1.29.15 at 3:15 pm.

## 2014-05-15 ENCOUNTER — Telehealth: Payer: Self-pay | Admitting: Internal Medicine

## 2014-05-15 NOTE — Telephone Encounter (Signed)
Pharm made mistake. No note needed

## 2014-05-21 ENCOUNTER — Encounter: Payer: Self-pay | Admitting: Internal Medicine

## 2014-05-21 ENCOUNTER — Telehealth: Payer: Self-pay | Admitting: Internal Medicine

## 2014-05-21 MED ORDER — PITAVASTATIN CALCIUM 2 MG PO TABS: 2.0000 mg | ORAL_TABLET | Freq: Every day | ORAL | Status: DC

## 2014-05-21 NOTE — Telephone Encounter (Signed)
rx sent in electronically 

## 2014-05-21 NOTE — Telephone Encounter (Signed)
Pt needs refill on livalo 12m g #90 w/refills  call into [facility] out[facility] pharm

## 2014-05-30 ENCOUNTER — Telehealth: Payer: Self-pay | Admitting: Internal Medicine

## 2014-05-30 NOTE — Telephone Encounter (Signed)
PA and Appeal for Livalo was denied.   Patient's plan is requiring patient to try and fail Vytorin or Crestor.  In the Appeal submitted, I noted patient has an allergy to "statins" but the plan is not accepting that as enough clinical documentation for his inability to take Crestor or Vytorin. Patient is scheduled to see Dr. Raliegh Ip on Friday, 06/01/13 for a med check appointment.

## 2014-05-30 NOTE — Telephone Encounter (Signed)
I have submitted a Level 2 Appeal on the patient's behalf.

## 2014-05-30 NOTE — Telephone Encounter (Signed)
Can you ask the pt specifically which statins he has tried and the side effect he has experienced.

## 2014-05-31 NOTE — Telephone Encounter (Signed)
Left message for pt to call back  °

## 2014-06-01 ENCOUNTER — Ambulatory Visit: Payer: 59 | Admitting: Internal Medicine

## 2014-06-01 ENCOUNTER — Ambulatory Visit (INDEPENDENT_AMBULATORY_CARE_PROVIDER_SITE_OTHER): Payer: 59 | Admitting: Internal Medicine

## 2014-06-01 ENCOUNTER — Encounter: Payer: Self-pay | Admitting: Internal Medicine

## 2014-06-01 VITALS — BP 110/68 | HR 71 | Temp 98.3°F | Resp 20 | Ht 70.0 in | Wt 200.0 lb

## 2014-06-01 DIAGNOSIS — M19042 Primary osteoarthritis, left hand: Secondary | ICD-10-CM

## 2014-06-01 DIAGNOSIS — E119 Type 2 diabetes mellitus without complications: Secondary | ICD-10-CM

## 2014-06-01 DIAGNOSIS — E785 Hyperlipidemia, unspecified: Secondary | ICD-10-CM

## 2014-06-01 DIAGNOSIS — M1990 Unspecified osteoarthritis, unspecified site: Secondary | ICD-10-CM

## 2014-06-01 MED ORDER — MONTELUKAST SODIUM 10 MG PO TABS: 10.0000 mg | ORAL_TABLET | Freq: Every day | ORAL | Status: DC

## 2014-06-01 NOTE — Progress Notes (Signed)
Pre visit review using our clinic review tool, if applicable. No additional management support is needed unless otherwise documented below in the visit note. 

## 2014-06-01 NOTE — Patient Instructions (Signed)
Limit your sodium (Salt) intake    It is important that you exercise regularly, at least 20 minutes 3 to 4 times per week.  If you develop chest pain or shortness of breath seek  medical attention.   Please check your hemoglobin A1c every 3 months   

## 2014-06-01 NOTE — Progress Notes (Signed)
Subjective:    Patient ID: Reginald Rios, male    DOB: 1956/02/07, 59 y.o.   MRN: [id number]  HPI  59 year old patient who is seen today for follow-up of type 2 diabetes.  He has done well.  He remains active with tennis.  Last hemoglobin A1c 7.1.  No new concerns or complaints.  He did have an eye examination in 2015. He has ADD, which has been stable. He has a statin intolerance.  Presently on prednisone for an inflammatory arthritis and is being evaluated by rheumatology for possible RA  Past Medical History  Diagnosis Date  . Diabetes mellitus without complication   . Exercise-induced asthma   . Arthritis   . Osteoarthritis   . Hypogonadism in male   . Low back pain   . ADD (attention deficit disorder)   . Anemia   . Heart murmur   . Hyperlipidemia     family hx of high cholesterol    History   Social History  . Marital Status: Married    Spouse Name: N/A    Number of Children: N/A  . Years of Education: N/A   Occupational History  . Not on file.   Social History Main Topics  . Smoking status: Never Smoker   . Smokeless tobacco: Never Used  . Alcohol Use: 0.0 oz/week    0 Not specified per week     Comment: rare  . Drug Use: No  . Sexual Activity: Not on file   Other Topics Concern  . Not on file   Social History Narrative   Previously worked as Geophysicist/field seismologist   Currently working at [employer]   Married 55 years   2 children ages 38, 65   Wife is psychiatrist - Dr. Margurite Auerbach   Moved from [place] up in [place]    Past Surgical History  Procedure Laterality Date  . Knee arthroscopy    . Tenotomy achilles tendon      Family History  Problem Relation Age of Onset  . Lung cancer Mother 65  . Coronary artery disease Father 48  . Hypothyroidism Brother   . Colon cancer Neg Hx   . Prostate cancer Neg Hx     Allergies  Allergen Reactions  . Statins     Current Outpatient Prescriptions on File Prior to Visit  Medication Sig  Dispense Refill  . diclofenac sodium (VOLTAREN) 1 % GEL Apply 2 g topically 4 (four) times daily.    . methylphenidate (RITALIN) 20 MG tablet Take 1 tablet (20 mg total) by mouth 3 (three) times daily with meals. 90 tablet 0  . montelukast (SINGULAIR) 10 MG tablet Take 1 tablet by mouth daily.    . nateglinide (STARLIX) 120 MG tablet Take 120 mg by mouth daily.     . sitaGLIPtin-metformin (JANUMET) 50-1000 MG per tablet Take 1 tablet by mouth 2 (two) times daily with a meal. 180 tablet 1  . testosterone (ANDROGEL) 50 MG/5GM (1%) GEL Place 5 g onto the skin daily. 30 Tube 0   No current facility-administered medications on file prior to visit.    BP 110/68 mmHg  Pulse 71  Temp(Src) 98.3 F (36.8 C) (Oral)  Resp 20  Ht 5\' 10"  (1.778 m)  Wt 200 lb (90.719 kg)  BMI 28.70 kg/m2  SpO2 98%     Review of Systems  Constitutional: Negative for fever, chills, appetite change and fatigue.  HENT: Negative for congestion, dental problem, ear pain, hearing loss,  sore throat, tinnitus, trouble swallowing and voice change.   Eyes: Negative for pain, discharge and visual disturbance.  Respiratory: Negative for cough, chest tightness, wheezing and stridor.   Cardiovascular: Negative for chest pain, palpitations and leg swelling.  Gastrointestinal: Negative for nausea, vomiting, abdominal pain, diarrhea, constipation, blood in stool and abdominal distention.  Genitourinary: Negative for urgency, hematuria, flank pain, discharge, difficulty urinating and genital sores.  Musculoskeletal: Negative for myalgias, back pain, joint swelling, arthralgias, gait problem and neck stiffness.  Skin: Negative for rash.  Neurological: Negative for dizziness, syncope, speech difficulty, weakness, numbness and headaches.  Hematological: Negative for adenopathy. Does not bruise/bleed easily.  Psychiatric/Behavioral: Negative for behavioral problems and dysphoric mood. The patient is not nervous/anxious.          Objective:   Physical Exam  Constitutional: He is oriented to person, place, and time. He appears well-developed.  HENT:  Head: Normocephalic.  Right Ear: External ear normal.  Left Ear: External ear normal.  Eyes: Conjunctivae and EOM are normal.  Neck: Normal range of motion.  Cardiovascular: Normal rate, normal heart sounds and intact distal pulses.   Pulmonary/Chest: Breath sounds normal.  Abdominal: Bowel sounds are normal.  Musculoskeletal: Normal range of motion. He exhibits no edema or tenderness.  Neurological: He is alert and oriented to person, place, and time.  Psychiatric: He has a normal mood and affect. His behavior is normal.          Assessment & Plan:  Diabetes mellitus.  Will check a hemoglobin A1c Dyslipidemia.  ADD.  Stable  Recheck 3 months

## 2014-06-02 ENCOUNTER — Encounter: Payer: Self-pay | Admitting: Internal Medicine

## 2014-06-11 ENCOUNTER — Telehealth: Payer: Self-pay | Admitting: Internal Medicine

## 2014-06-11 NOTE — Telephone Encounter (Signed)
PA, Appeal, and Level 2 Appeal for Livalo was denied.  I called the plan and the representative states she will forward the denial to her supervisor for review.  I was advised that there isn't enough clinical documentation provide as to the adverse reaction to Vytorin and why Livalo is better for the patient.

## 2014-06-11 NOTE — Telephone Encounter (Signed)
Pt stated he has had muscle aches with vytorin.  He would be okay with trying something different if needed.  Please advise

## 2014-06-12 MED ORDER — PRAVASTATIN SODIUM 40 MG PO TABS: 40.0000 mg | ORAL_TABLET | Freq: Every day | ORAL | Status: DC

## 2014-06-12 NOTE — Telephone Encounter (Signed)
Have him try generic pravastatin 40mg  # 90 one po qd RF x 1

## 2014-06-12 NOTE — Telephone Encounter (Signed)
Called and spoke with pt and pt is aware of recommendations.  Rx sent to [facility].

## 2014-07-18 ENCOUNTER — Other Ambulatory Visit: Payer: Self-pay | Admitting: Internal Medicine

## 2014-07-31 ENCOUNTER — Telehealth: Payer: Self-pay | Admitting: Internal Medicine

## 2014-07-31 MED ORDER — TESTOSTERONE 50 MG/5GM (1%) TD GEL: 5.0000 g | Freq: Every day | TRANSDERMAL | Status: DC

## 2014-07-31 NOTE — Telephone Encounter (Signed)
Pt need a refill on Methylphenidate 20 mg tab sent to [facility] at [place]. Please call back and let him know it was sent.

## 2014-08-01 MED ORDER — METHYLPHENIDATE HCL 20 MG PO TABS: 20.0000 mg | ORAL_TABLET | Freq: Three times a day (TID) | ORAL | Status: DC

## 2014-08-01 NOTE — Telephone Encounter (Signed)
Ok to RF x 2

## 2014-08-01 NOTE — Telephone Encounter (Signed)
rx's up front for p/u, pt aware

## 2014-08-14 ENCOUNTER — Ambulatory Visit (INDEPENDENT_AMBULATORY_CARE_PROVIDER_SITE_OTHER): Payer: 59 | Admitting: Family Medicine

## 2014-08-14 ENCOUNTER — Encounter: Payer: Self-pay | Admitting: Family Medicine

## 2014-08-14 VITALS — BP 130/76 | Temp 98.4°F | Ht 70.0 in | Wt 193.0 lb

## 2014-08-14 DIAGNOSIS — H8110 Benign paroxysmal vertigo, unspecified ear: Secondary | ICD-10-CM | POA: Insufficient documentation

## 2014-08-14 HISTORY — DX: Benign paroxysmal vertigo, unspecified ear: H81.10

## 2014-08-14 MED ORDER — ONDANSETRON 4 MG PO TBDP: 4.0000 mg | ORAL_TABLET | Freq: Three times a day (TID) | ORAL | Status: DC | PRN

## 2014-08-14 MED ORDER — MECLIZINE HCL 25 MG PO TABS: 12.5000 mg | ORAL_TABLET | Freq: Three times a day (TID) | ORAL | Status: DC | PRN

## 2014-08-14 NOTE — Progress Notes (Signed)
Garret Reddish, MD Phone: [phone]  Subjective:   Reginald Rios is a 59 y.o. year old very pleasant male patient who presents with the following:  Vertigo -All day Sunday worked outside, Monday morning looked right when he woke up and felt some room spinning and stopped moving and symptoms improved. If moved head while walking, Felt like he was walking across a deck on a boat. Quick movements make it worse. No falls from this. Has not been ill with URI symptoms recently. Had some meclizine at home and seems to help. Episodes last seconds and no more than a minute. Stable in course. Nauseous when room spins  Similar symptoms 2-3 weeks ago lasting about a week then resolving.   Very similar symptoms in 2011 and had old meclizine.   ROS- No tinnitus, no hearing loss. No blurry vision or more than mild headache.   Past Medical History- Patient Active Problem List   Diagnosis Date Noted  . Hyperlipidemia 01/19/2014  . Anemia, unspecified 01/19/2014  . ADD (attention deficit disorder) 01/19/2014  . Diabetes mellitus without complication 58/01/9832  . Hypogonadism in male 12/22/2013  . Arthritis of hand, left 12/22/2013   Medications- reviewed and updated Current Outpatient Prescriptions  Medication Sig Dispense Refill  . Calcium Carbonate-Vitamin D (CALCIUM + D PO) Take 1 each by mouth 2 (two) times daily.    . diclofenac sodium (VOLTAREN) 1 % GEL Apply 2 g topically 4 (four) times daily.    . methylphenidate (RITALIN) 20 MG tablet Take 1 tablet (20 mg total) by mouth 3 (three) times daily with meals. 90 tablet 0  . montelukast (SINGULAIR) 10 MG tablet Take 1 tablet (10 mg total) by mouth daily. 90 tablet 3  . nateglinide (STARLIX) 120 MG tablet Take 120 mg by mouth daily.     . pravastatin (PRAVACHOL) 40 MG tablet Take 1 tablet (40 mg total) by mouth daily. 90 tablet 1  . sitaGLIPtin-metformin (JANUMET) 50-1000 MG per tablet Take 1 tablet by mouth 2 (two) times daily with a  meal. 180 tablet 1  . testosterone (ANDROGEL) 50 MG/5GM (1%) GEL Place 5 g onto the skin daily. 30 Tube 3  . predniSONE (DELTASONE) 10 MG tablet Take 10 mg by mouth as directed.     No current facility-administered medications for this visit.    Objective: BP 130/76 mmHg  Temp(Src) 98.4 F (36.9 C) (Oral)  Ht 5\' 10"  (1.778 m)  Wt 193 lb (87.544 kg)  BMI 27.69 kg/m2 Gen: NAD, resting comfortably in bed HEENT: NCAT. PERRLA.  CV: RRR no mrg  Lungs: CTAB  Abd: soft/nontender/nondistended/normal bowel sounds  MSK: moves all extremities, no edema  Skin: warm and dry, no rash  Neuro: CN II-XII intact, sensation and reflexes normal throughout, 5/5 muscle strength in bilateral upper and lower extremities. Normal finger to nose. Normal rapid alternating movements. Normal romberg. No pronator drift. Vertigo with laying down   Assessment/Plan:  BPPV (benign paroxysmal positional vertigo) Discussed this is most likely cause and treat symptomatically with meclizine for vertigo and zofran for nausea. Normal neuro exam. He requested I did not do dix-hallpike as was done previously with similar symptoms and he did not tolerate well. Given pretty typical symptoms I agreed may not contribute much so deferred. Handout for modified epley that he can perform at home. Discussed risk factors for CVA include DM and HLD but i thought less likely. Doubt meniere's with no tinnitus or hearing loss. Doubt vestibular neuritis with time course and no illness.  He has follow up with Dr. [name] later this month and can be reassessed but gave sooner return precautions.      Meds ordered this encounter  Medications  . meclizine (ANTIVERT) 25 MG tablet    Sig: Take 0.5-1 tablets (12.5-25 mg total) by mouth 3 (three) times daily as needed for dizziness.    Dispense:  40 tablet    Refill:  0  . ondansetron (ZOFRAN-ODT) 4 MG disintegrating tablet    Sig: Take 1 tablet (4 mg total) by mouth every 8 (eight) hours as needed  for nausea or vomiting.    Dispense:  20 tablet    Refill:  0

## 2014-08-14 NOTE — Patient Instructions (Signed)
Use print out for home exercises to help improve this Meclizine should help nausea zofran can help with nausea  If your symptoms persist beyond 10 days, happy to see you back. Also see [place] if you have new or worsening symptoms  Benign Positional Vertigo Vertigo means you feel like you or your surroundings are moving when they are not. Benign positional vertigo is the most common form of vertigo. Benign means that the cause of your condition is not serious. Benign positional vertigo is more common in older adults. CAUSES  Benign positional vertigo is the result of an upset in the labyrinth system. This is an area in the middle ear that helps control your balance. This may be caused by a viral infection, head injury, or repetitive motion. However, often no specific cause is found. SYMPTOMS  Symptoms of benign positional vertigo occur when you move your head or eyes in different directions. Some of the symptoms may include:  Loss of balance and falls.  Vomiting.  Blurred vision.  Dizziness.  Nausea.  Involuntary eye movements (nystagmus). DIAGNOSIS  Benign positional vertigo is usually diagnosed by physical exam. If the specific cause of your benign positional vertigo is unknown, your caregiver may perform imaging tests, such as magnetic resonance imaging (MRI) or computed tomography (CT). TREATMENT  Your caregiver may recommend movements or procedures to correct the benign positional vertigo. Medicines such as meclizine, benzodiazepines, and medicines for nausea may be used to treat your symptoms. In rare cases, if your symptoms are caused by certain conditions that affect the inner ear, you may need surgery. HOME CARE INSTRUCTIONS   Follow your caregiver's instructions.  Move slowly. Do not make sudden body or head movements.  Avoid driving.  Avoid operating heavy machinery.  Avoid performing any tasks that would be dangerous to you or others during a vertigo episode.  Drink  enough fluids to keep your urine clear or pale yellow. SEEK IMMEDIATE MEDICAL CARE IF:   You develop problems with walking, weakness, numbness, or using your arms, hands, or legs.  You have difficulty speaking.  You develop severe headaches.  Your nausea or vomiting continues or gets worse.  You develop visual changes.  Your family or friends notice any behavioral changes.  Your condition gets worse.  You have a fever.  You develop a stiff neck or sensitivity to light. MAKE SURE YOU:   Understand these instructions.  Will watch your condition.  Will get help right away if you are not doing well or get worse. Document Released: 01/26/2006 Document Revised: 07/13/2011 Document Reviewed: 01/08/2011 [facility] Patient Information 2015 [place], [place]. This information is not intended to replace advice given to you by your health care provider. Make sure you discuss any questions you have with your health care provider.

## 2014-08-14 NOTE — Progress Notes (Signed)
Pre visit review using our clinic review tool, if applicable. No additional management support is needed unless otherwise documented below in the visit note. 

## 2014-08-14 NOTE — Assessment & Plan Note (Addendum)
Discussed this is most likely cause and treat symptomatically with meclizine for vertigo and zofran for nausea. Normal neuro exam. He requested I did not do dix-hallpike as was done previously with similar symptoms and he did not tolerate well. Given pretty typical symptoms I agreed may not contribute much so deferred. Handout for modified epley that he can perform at home. Discussed risk factors for CVA include DM and HLD but i thought less likely. Doubt meniere's with no tinnitus or hearing loss. Doubt vestibular neuritis with time course and no illness. He has follow up with Dr. [name] later this month and can be reassessed but gave sooner return precautions.

## 2014-08-31 ENCOUNTER — Ambulatory Visit: Payer: 59 | Admitting: Family Medicine

## 2014-08-31 ENCOUNTER — Ambulatory Visit: Payer: 59 | Admitting: Internal Medicine

## 2014-09-03 ENCOUNTER — Ambulatory Visit (INDEPENDENT_AMBULATORY_CARE_PROVIDER_SITE_OTHER): Payer: 59 | Admitting: Adult Health

## 2014-09-03 ENCOUNTER — Encounter: Payer: Self-pay | Admitting: Adult Health

## 2014-09-03 ENCOUNTER — Ambulatory Visit: Payer: 59 | Admitting: Internal Medicine

## 2014-09-03 VITALS — BP 98/60 | Temp 98.5°F | Ht 70.0 in | Wt 190.2 lb

## 2014-09-03 DIAGNOSIS — G8929 Other chronic pain: Secondary | ICD-10-CM

## 2014-09-03 DIAGNOSIS — E119 Type 2 diabetes mellitus without complications: Secondary | ICD-10-CM | POA: Diagnosis not present

## 2014-09-03 DIAGNOSIS — M25559 Pain in unspecified hip: Secondary | ICD-10-CM

## 2014-09-03 DIAGNOSIS — M545 Low back pain: Secondary | ICD-10-CM

## 2014-09-03 DIAGNOSIS — G729 Myopathy, unspecified: Secondary | ICD-10-CM

## 2014-09-03 LAB — URINALYSIS, ROUTINE W REFLEX MICROSCOPIC
Bilirubin Urine: NEGATIVE
Hgb urine dipstick: NEGATIVE
Ketones, ur: NEGATIVE
Leukocytes, UA: NEGATIVE
Nitrite: NEGATIVE
RBC / HPF: NONE SEEN (ref 0–?)
Specific Gravity, Urine: >=1.03 — AB (ref 1.000–1.030)
Total Protein, Urine: NEGATIVE
Urine Glucose: NEGATIVE
Urobilinogen, UA: 0.2 (ref 0.0–1.0)
pH: 6 (ref 5.0–8.0)

## 2014-09-03 LAB — BASIC METABOLIC PANEL
BUN: 14 mg/dL (ref 6–23)
CO2: 27 mEq/L (ref 19–32)
Calcium: 9.3 mg/dL (ref 8.4–10.5)
Chloride: 107 mEq/L (ref 96–112)
Creatinine, Ser: 0.95 mg/dL (ref 0.40–1.50)
GFR: 85.58 mL/min (ref >60.00)
Glucose, Bld: 164 mg/dL — ABNORMAL HIGH (ref 70–99)
Potassium: 5 mEq/L (ref 3.5–5.1)
Sodium: 140 mEq/L (ref 135–145)

## 2014-09-03 LAB — HEMOGLOBIN A1C: Hgb A1c MFr Bld: 8.6 % — ABNORMAL HIGH (ref 4.6–6.5)

## 2014-09-03 NOTE — Progress Notes (Signed)
   Subjective:    Patient ID: Reginald Rios, male    DOB: [birth year], 59 y.o.   MRN: [id number]  HPI  Patient presents to the office today for three month follow up. He continues to complain of slight dizziness which has improved without meclazine. He also has complaints of muscle aches and he is not sure if this is due to arthritis or the statin therapy. Additionally he feels as though his diabetes is not well controlled - Blood sugars at home range in 170-200's. Feels as though his medications are not working.   Continues to take testosterone replacement.    Review of Systems  Musculoskeletal: Positive for myalgias, back pain, arthralgias, gait problem and neck pain. Negative for joint swelling and neck stiffness.  Neurological: Positive for dizziness.  All other systems reviewed and are negative.      Objective:   Physical Exam  Constitutional: He is oriented to person, place, and time. He appears well-developed and well-nourished. No distress.  Cardiovascular: Normal rate, regular rhythm and normal heart sounds.  Exam reveals no gallop and no friction rub.   No murmur heard. Pulmonary/Chest: Effort normal and breath sounds normal.  Abdominal: Soft. Bowel sounds are normal. He exhibits no distension. There is no tenderness.  Musculoskeletal: Normal range of motion. He exhibits tenderness.  In right and left hip with flexion  Neurological: He is alert and oriented to person, place, and time.  Skin: Skin is warm and dry.  Psychiatric: He has a normal mood and affect. His behavior is normal. Judgment and thought content normal.  Vitals reviewed.       Assessment & Plan:  1. Diabetes mellitus without complication - Basic metabolic panel - Hemoglobin A1c - Urinalysis, Routine w reflex microscopic - Will follow up with patient once labs are back - Follow up in three months or sooner if needed.   2. Myopathy - Take statin holiday. Advised to let us know if muscle aches do not  get better.   3. Hip pain, unspecified laterality - Ambulatory referral to Sports Medicine  4. Chronic lower back pain - Ambulatory referral to Sports Medicine

## 2014-09-03 NOTE — Patient Instructions (Signed)
Take a break from the prevastatin to see if your muscle aches subside. I will let you know what your labs show and I have sent a referall to sports medicine for you, they will contact you for an appointment. Follow up in three months to have your A1c rechecked.

## 2014-09-03 NOTE — Progress Notes (Signed)
Pre visit review using our clinic review tool, if applicable. No additional management support is needed unless otherwise documented below in the visit note. 

## 2014-09-04 ENCOUNTER — Encounter: Payer: Self-pay | Admitting: Adult Health

## 2014-09-05 ENCOUNTER — Other Ambulatory Visit: Payer: Self-pay | Admitting: Adult Health

## 2014-09-05 MED ORDER — CANAGLIFLOZIN 100 MG PO TABS
300.0000 mg | ORAL_TABLET | Freq: Every day | ORAL | Status: DC
Start: 2014-09-05 — End: 2014-09-07

## 2014-09-05 NOTE — Telephone Encounter (Signed)
Notified patient of his A1c. He endorses that his diet is not well controlled. I added Invokana 300mg  Daily to his regimen on the advice of his PCP. Reginald Rios is to work on his diet, bring his glucometer to his next appointment in 3-4 weeks.

## 2014-09-07 ENCOUNTER — Other Ambulatory Visit: Payer: Self-pay

## 2014-09-07 MED ORDER — CANAGLIFLOZIN 300 MG PO TABS: 300.0000 mg | ORAL_TABLET | Freq: Every day | ORAL | Status: DC

## 2014-09-11 ENCOUNTER — Ambulatory Visit: Payer: 59 | Admitting: Family Medicine

## 2014-09-26 ENCOUNTER — Ambulatory Visit: Payer: 59 | Admitting: Family Medicine

## 2014-10-12 ENCOUNTER — Ambulatory Visit (INDEPENDENT_AMBULATORY_CARE_PROVIDER_SITE_OTHER): Payer: 59 | Admitting: Family Medicine

## 2014-10-12 ENCOUNTER — Other Ambulatory Visit (INDEPENDENT_AMBULATORY_CARE_PROVIDER_SITE_OTHER): Payer: 59

## 2014-10-12 ENCOUNTER — Encounter: Payer: Self-pay | Admitting: Family Medicine

## 2014-10-12 ENCOUNTER — Ambulatory Visit: Payer: 59 | Admitting: Family Medicine

## 2014-10-12 VITALS — BP 130/70 | HR 87 | Ht 70.0 in | Wt 189.0 lb

## 2014-10-12 DIAGNOSIS — M7551 Bursitis of right shoulder: Secondary | ICD-10-CM | POA: Diagnosis not present

## 2014-10-12 DIAGNOSIS — M48061 Spinal stenosis, lumbar region without neurogenic claudication: Secondary | ICD-10-CM

## 2014-10-12 DIAGNOSIS — M4806 Spinal stenosis, lumbar region: Secondary | ICD-10-CM | POA: Diagnosis not present

## 2014-10-12 DIAGNOSIS — M25511 Pain in right shoulder: Secondary | ICD-10-CM

## 2014-10-12 DIAGNOSIS — M7550 Bursitis of unspecified shoulder: Secondary | ICD-10-CM

## 2014-10-12 HISTORY — DX: Spinal stenosis, lumbar region without neurogenic claudication: M48.061

## 2014-10-12 HISTORY — DX: Bursitis of unspecified shoulder: M75.50

## 2014-10-12 NOTE — Patient Instructions (Signed)
Good to see you.  Ice 20 minutes 2 times daily. Usually after activity and before bed. Exercises 3 times a week.  Sacroiliac Joint Mobilization and Rehab 1. Work on pretzel stretching, shoulder back and leg draped in front. 3-5 sets, 30 sec.. 2. hip abductor rotations. standing, hip flexion and rotation outward then inward. 3 sets, 15 reps. when can do comfortably, add ankle weights starting at 2 pounds.  3. cross over stretching - shoulder back to ground, same side leg crossover. 3-5 sets for 30 min..  4. rolling up and back knees to chest and rocking. 5. sacral tilt - 5 sets, hold for 5-10 seconds Exercises on wall.  Heel and butt touching.  Raise leg 6 inches and hold 2 seconds.  Down slow for count of 4 seconds.  1 set of 30 reps daily on both sides.  pennsaid pinkie amount topically 2 times daily as needed.  .Take tylenol 650 mg three times a day is the best evidence based medicine we have for arthritis.  Glucosamine sulfate 1500mg  twice a day is a supplement that has been shown to help moderate to severe arthritis. Vitamin D 2000 IU daily Fish oil 2 grams daily.  Tumeric 500mg  twice daily.  It's important that you continue to stay active. Controlling your weight is important.  Consider physical therapy to strengthen muscles around the joint that hurts to take pressure off of the joint itself. Shoe inserts with good arch support may be helpful.  [employer] orthotics at [employer] [facility] could help.  Water aerobics and cycling with low resistance are the best two types of exercise for arthritis. Come back and see me in 3 weeks.

## 2014-10-12 NOTE — Assessment & Plan Note (Signed)
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 Patient also has some sacroiliac joint dysfunction given some exercises. We discussed over-the-counter natural supplementations a could be beneficial as well. We discussed the patient does not respond to the home exercises, icing, as well as the over-the-counter medicines we discussed an epidurals may be necessary. We will discuss this at follow-up in 3 weeks.

## 2014-10-12 NOTE — Progress Notes (Signed)
Pre visit review using our clinic review tool, if applicable. No additional management support is needed unless otherwise documented below in the visit note. 

## 2014-10-12 NOTE — Progress Notes (Signed)
[facility] [address] [place], [place] [address] Phone: [phone] Subjective:    I'm seeing this patient by the request  of:  Drema Pry, DO   CC: right shoulder pain, back pain  CHE:NIDPOEUMPN Truxton Stupka is a 59 y.o. male coming in with complaint of right shoulderto pain and back pain. 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 Location more anterior and posterior. Seems daily now. 6/10 severity.   Back pain, worsening, no injury worse a year ago and had imaging. Been dealing with it but started to affect ADLs and teaching tennis, less movement, no radiation of pain but states legs feel tired with activity.  If he does any yardwork or activity out of the ordinary he pays for it the next days. Sometimes a burning cramping sensation followed by  Dull ache. Can keep him up at night.  Minimal responds to OTC meds. 7/10 severity      patient one year ago did have an MRI of his lumbar spine that shows L4-5: Endplate osteophytes and bulging of the disc. Bilateral facet and ligamentous hypertrophy left worse than right with stenosis of the lateral recesses and foramina left worse than right. This couldbe symptomatic.  L5-S1: Advanced bilateral facet arthropathy with 5 mm of anterolisthesis. Bulging of the disc. Stenosis of the lateral recesses and foramina that could be symptomatic  Past Medical History  Diagnosis Date  . Diabetes mellitus without complication   . Exercise-induced asthma   . Arthritis   . Osteoarthritis   . Hypogonadism in male   . Low back pain   . ADD (attention deficit disorder)   . Anemia   . Heart murmur   . Hyperlipidemia     family hx of high cholesterol   Past Surgical History  Procedure Laterality Date  . Knee arthroscopy     . Tenotomy achilles tendon     History  Substance Use Topics  . Smoking status: Never Smoker   . Smokeless tobacco: Never Used  . Alcohol Use: 0.0 oz/week    0 Standard drinks or equivalent per week     Comment: rare   Allergies  Allergen Reactions  . Statins     Past medical history, social, surgical and family history all reviewed in electronic medical record.   Review of Systems: No headache, visual changes, nausea, vomiting, diarrhea, constipation, dizziness, abdominal pain, skin rash, fevers, chills, night sweats, weight loss, swollen lymph nodes, body aches, joint swelling, muscle aches, chest pain, shortness of breath, mood changes.   Objective Blood pressure 130/70, pulse 87, height 5\' 10"  (1.778 m), weight 189 lb (85.73 kg), SpO2 98 %.  General: No apparent distress alert and oriented x3 mood and affect normal, dressed appropriately.  HEENT: Pupils equal, extraocular movements intact  Respiratory: Patient's speak in full sentences and does not appear short of breath  Cardiovascular: No lower extremity edema, non tender, no erythema  Skin: Warm dry intact with no signs of infection or rash on extremities or on axial skeleton.  Abdomen: Soft nontender  Neuro: Cranial nerves II through XII are intact, neurovascularly intact in all extremities with 2+ DTRs and 2+ pulses.  Lymph: No lymphadenopathy of posterior or anterior cervical chain or axillae bilaterally.  Gait normal with good balance and coordination.  MSK:  Non tender with full range of motion and good stability and symmetric strength and tone of elbows, wrist,  knee and ankles bilaterally.  Back Exam:  Inspection: Unremarkable  Motion: Flexion 25 deg, Extension 25 deg, Side Bending to 25 deg bilaterally,  Rotation to 45 deg bilaterally  SLR laying: Negative  XSLR laying: Negative  Palpable tenderness: severe tenderness over SI joint bilaterally.  FABER: Positive bilaterally with extremely tight hip girdle.    Sensory change: Gross sensation intact to all lumbar and sacral dermatomes.  Reflexes: 2+ at both patellar tendons, 2+ at achilles tendons, Babinski's downgoing.  Strength at foot  Plantar-flexion: 5/5 Dorsi-flexion: 5/5 Eversion: 5/5 Inversion: 5/5  Leg strength  Quad: 5/5 Hamstring: 5/5 Hip flexor: 5/5 Hip abductors: 4/5  Gait unremarkable.with stiffness in lower back.    Shoulder: Right Inspection reveals no abnormalities, atrophy or asymmetry. Palpation is normal with no tenderness over AC joint or bicipital groove. ROM is full in all planes passively. Rotator cuff strength normal throughout. signs of impingement with positive Neer and Hawkin's tests, but negative empty can sign. Speeds and Yergason's tests normal. No labral pathology noted with negative Obrien's, negative clunk and good stability. Normal scapular function observed. No painful arc and no drop arm sign. No apprehension sign  MSK US performed of: Right This study was ordered, performed, and interpreted by Charlann Boxer D.O.  Shoulder:   Supraspinatus:  Appears normal on long and transverse views, Bursal bulge seen with shoulder abduction on impingement view. Infraspinatus:  Appears normal on long and transverse views. Significant increase in Doppler flow Subscapularis:  Appears normal on long and transverse views. Positive bursa Teres Minor:  Appears normal on long and transverse views. AC joint:  Capsule undistended, no geyser sign. Glenohumeral Joint:  Appears normal without effusion. Glenoid Labrum:  Intact without visualized tears. Biceps Tendon:  Appears normal on long and transverse views, no fraying of tendon, tendon located in intertubercular groove, no subluxation with shoulder internal or external rotation.  Impression: Subacromial bursitis  Procedure: Real-time Ultrasound Guided Injection of right glenohumeral joint Device: GE Logiq E  Ultrasound guided injection is preferred based studies that show  increased duration, increased effect, greater accuracy, decreased procedural pain, increased response rate with ultrasound guided versus blind injection.  Verbal informed consent obtained.  Time-out conducted.  Noted no overlying erythema, induration, or other signs of local infection.  Skin prepped in a sterile fashion.  Local anesthesia: Topical Ethyl chloride.  With sterile technique and under real time ultrasound guidance:  Joint visualized.  23g 1  inch needle inserted posterior approach. Pictures taken for needle placement. Patient did have injection of 2 cc of 1% lidocaine, 2 cc of 0.5% Marcaine, and 1.0 cc of Kenalog 40 mg/dL. Completed without difficulty  Pain immediately resolved suggesting accurate placement of the medication.  Advised to call if fevers/chills, erythema, induration, drainage, or persistent bleeding.  Images permanently stored and available for review in the ultrasound unit.  Impression: Technically successful ultrasound guided injection.  Procedure note 76160; 15 minutes spent for Therapeutic exercises as stated in above notes.  This included exercises focusing on stretching, strengthening, with significant focus on eccentric aspects. Flexion and extension exercises, hip flexor stetching and hip abductor strengthening.   Proper technique shown and discussed handout in great detail with ATC.  All questions were discussed and answered.      Impression and Recommendations:     This case required medical decision making of moderate complexity.

## 2014-10-12 NOTE — Assessment & Plan Note (Signed)
Patient was given an injection today. We discussed icing regimen and home exercises. We discussed home exercises and patient was given a handout. Discussed what activities to potentially avoid. I do see some mild arthritis on ultrasound that this is very minimal. Patient does not make any significant improvement we may need to consider advanced imaging but no think that this will be a possibility.

## 2014-10-16 ENCOUNTER — Telehealth: Payer: Self-pay | Admitting: Internal Medicine

## 2014-10-16 NOTE — Telephone Encounter (Signed)
Pt called to say that he was on canagliflozin (INVOKANA) 300 MG TABS tablet and he broke out in a rash under his arm. Pt said he is no longer taking the med and the rash went away. His is asking if he should be trying something else because he went back to janumet.

## 2014-10-16 NOTE — Telephone Encounter (Signed)
Pls advise.  

## 2014-10-17 NOTE — Telephone Encounter (Signed)
Left a message for pt to return call 

## 2014-10-17 NOTE — Telephone Encounter (Signed)
How were his blood sugars while he was taking Invokana?   Is he still taking the Starlix? I can send a prescription in to the pharmacy for [facility]

## 2014-10-17 NOTE — Telephone Encounter (Signed)
Pt returned call and states he did not check his blood sugars while on the Invokana.Pt states the rash from the medication was bad and his wife advised that he stop it immediately.   Pt is still currently taking the Starlix. Pt states his blood sugars were 126 and 63 recently.  Pt states he is having a problem with taking the medication prescribed during the day.  Pt takes the Janumet every morning and evening but has difficulty with taking the medication during the middle of the day.  Pt has concerns about numbness and tingling in hands and sexual problems.  Advised pt that he come in to the office to be seen.  Pt is aware of appt on 6.17.16.

## 2014-10-19 ENCOUNTER — Encounter: Payer: Self-pay | Admitting: Adult Health

## 2014-10-19 ENCOUNTER — Ambulatory Visit (INDEPENDENT_AMBULATORY_CARE_PROVIDER_SITE_OTHER): Payer: 59 | Admitting: Adult Health

## 2014-10-19 VITALS — BP 120/80 | Temp 98.6°F | Ht 70.0 in | Wt 185.8 lb

## 2014-10-19 DIAGNOSIS — E1165 Type 2 diabetes mellitus with hyperglycemia: Secondary | ICD-10-CM

## 2014-10-19 DIAGNOSIS — IMO0002 Reserved for concepts with insufficient information to code with codable children: Secondary | ICD-10-CM

## 2014-10-19 NOTE — Progress Notes (Signed)
Subjective:    Patient ID: Reginald Rios, male    DOB: [birth year], 59 y.o.   MRN: [id number]  HPI  Reginald Rios presents today to the office for follow up regarding his diabetes. His last A1c was 8.6 on 09/03/2014. At that time Reginald Rios was started on Invokana. On 6/14 I received a call that Reginald Rios had a rash under his left arm and left flank. Only been taking the medication for a couple of days, when Reginald Rios stopped the medication the rash went away. Currently for his diabetes Reginald Rios is taking Janumet and Starlix but is unable to take the Starlix on a daily basis. Reginald Rios does not check his blood sugars at home on a constant basis the last one Reginald Rios took was on 10/18/2014 and that was 145. 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  Additionally today Reginald Rios endorses that when Reginald Rios has sex Reginald Rios can no longer feel his penis and that Reginald Rios is having numbness and tingling in his lower extremities.   Reginald Rios was recently bitten by a spider on his right leg and left upper arm Reginald Rios noticed infection, went to the nearest urgent care was started on antibiotics. The wound on his right leg is well-healed. The wound on his left forearm is not  well-healed due to him picking the scabs.   Lab Results  Component Value Date   HGBA1C 8.6* 09/03/2014  `  Review of Systems  Constitutional: Positive for fatigue. Negative for activity change, appetite change and unexpected weight change.  Respiratory: Negative.   Cardiovascular: Negative.   Gastrointestinal: Negative.   Endocrine: Negative.   Genitourinary: Negative.   Musculoskeletal: Positive for myalgias, back pain and arthralgias. Negative for neck pain and neck stiffness.  Skin: Negative.   Allergic/Immunologic: Negative.   Neurological: Positive for numbness (in penis and lower extremities). Negative for dizziness, weakness, light-headedness and headaches.  Psychiatric/Behavioral: Negative.     All other systems reviewed and are negative.  Past Medical History  Diagnosis Date  . Diabetes mellitus without complication   . Exercise-induced asthma   . Arthritis   . Osteoarthritis   . Hypogonadism in male   . Low back pain   . ADD (attention deficit disorder)   . Anemia   . Heart murmur   . Hyperlipidemia     family hx of high cholesterol    History   Social History  . Marital Status: Married    Spouse Name: N/A  . Number of Children: N/A  . Years of Education: N/A   Occupational History  . Not on file.   Social History Main Topics  . Smoking status: Never Smoker   . Smokeless tobacco: Never Used  . Alcohol Use: 0.0 oz/week    0 Standard drinks or equivalent per week     Comment: rare  . Drug Use: No  . Sexual Activity: Not on file   Other Topics Concern  . Not on file   Social History Narrative   Previously worked as Geophysicist/field seismologist   Currently working at [employer]   Married 65 years   2 children ages 30, 69   Wife is psychiatrist - Dr. Margurite Auerbach   Moved from [place] in [place]    Past Surgical History  Procedure Laterality Date  . Knee arthroscopy    . Tenotomy achilles tendon  Family History  Problem Relation Age of Onset  . Lung cancer Mother 21  . Coronary artery disease Father 54  . Hypothyroidism Brother   . Colon cancer Neg Hx   . Prostate cancer Neg Hx     Allergies  Allergen Reactions  . Statins     Current Outpatient Prescriptions on File Prior to Visit  Medication Sig Dispense Refill  . methylphenidate (RITALIN) 20 MG tablet Take 1 tablet (20 mg total) by mouth 3 (three) times daily with meals. 90 tablet 0  . montelukast (SINGULAIR) 10 MG tablet Take 1 tablet (10 mg total) by mouth daily. 90 tablet 3  . nateglinide (STARLIX) 120 MG tablet Take 120 mg by mouth daily.     . pravastatin (PRAVACHOL) 40 MG tablet Take 1 tablet (40 mg total) by mouth daily. 90 tablet 1  . sitaGLIPtin-metformin (JANUMET)  50-1000 MG per tablet Take 1 tablet by mouth 2 (two) times daily with a meal. 180 tablet 1  . testosterone (ANDROGEL) 50 MG/5GM (1%) GEL Place 5 g onto the skin daily. 30 Tube 3  . canagliflozin (INVOKANA) 300 MG TABS tablet Take 300 mg by mouth daily before breakfast. (Patient not taking: Reported on 10/19/2014) 30 tablet 3   No current facility-administered medications on file prior to visit.    BP 120/80 mmHg  Temp(Src) 98.6 F (37 C) (Oral)  Ht 5\' 10"  (1.778 m)  Wt 185 lb 12.8 oz (84.278 kg)  BMI 26.66 kg/m2       Objective:   Physical Exam  Constitutional: Reginald Rios is oriented to person, place, and time. Reginald Rios appears well-developed and well-nourished. No distress.  Cardiovascular: Normal rate, regular rhythm, normal heart sounds and intact distal pulses.  Exam reveals no gallop and no friction rub.   No murmur heard. Pulmonary/Chest: Effort normal and breath sounds normal. No respiratory distress. Reginald Rios has no wheezes. Reginald Rios has no rales. Reginald Rios exhibits no tenderness.  Genitourinary:  Deferred  Musculoskeletal: Normal range of motion. Reginald Rios exhibits no edema or tenderness.  Neurological: Reginald Rios is alert and oriented to person, place, and time. Reginald Rios has normal reflexes. Coordination normal.  Skin: Skin is warm and dry. No rash noted. Reginald Rios is not diaphoretic. No erythema. No pallor.  Well healed wound mark on right leg.   Healing, but not healed wound on left upper arm.   Psychiatric: Reginald Rios has a normal mood and affect. His behavior is normal. Thought content normal.  Nursing note and vitals reviewed.       Assessment & Plan:  1. Diabetes type 2, uncontrolled - There appears to be some issue of compliance with his diabetes medication and unwillingness to change diet. His inability to feel his penis during sex and the numbness and tingling in his lower extremities is likely due from his control diabetes. It was reinforced to Reginald Rios today that Reginald Rios needs to make like changes involving his medication regimen and  his diet. I have educated him on  complications related to uncontrolled diabetes. Reginald Rios is to be held accountable for his medicine regimen as well as his diet. -Stop eating fast food and stop eating out. Follow a diabetic diet. -Take medications for diabetes as prescribed. -Reginald Rios will follow-up in 2 months for a repeat A1c and BMP. Reginald Rios is to come to this test fasting -I will keep him on the current medication regimen until we receive that A1c. At that time I will consider adding a GLP-1 receptor agonist and/or referring to endocrinology. -Reginald Rios is to check  his blood sugars multiple times per day, and bring internal to his next appointment. -Reginald Rios is to inform me if his fasting blood sugars are above 150 for 2 consecutive days.

## 2014-10-19 NOTE — Progress Notes (Signed)
Pre visit review using our clinic review tool, if applicable. No additional management support is needed unless otherwise documented below in the visit note. 

## 2014-10-19 NOTE — Patient Instructions (Addendum)
You need to start monitoring your blood sugars more closely, period!  I need you to start taking your medications on a daily basis. Follow up with me in two months so we can recheck your A1c. You have two months to prove that you are working on controlling your blood sugar, you can do this! Stop eating out, pack healthy lunches for when you are traveling and watch your sugar and carb intake.   Let me know if your sugars are above 200 for 2 consecutive days.   Diabetes Mellitus and Food It is important for you to manage your blood sugar (glucose) level. Your blood glucose level can be greatly affected by what you eat. Eating healthier foods in the appropriate amounts throughout the day at about the same time each day will help you control your blood glucose level. It can also help slow or prevent worsening of your diabetes mellitus. Healthy eating may even help you improve the level of your blood pressure and reach or maintain a healthy weight.  HOW CAN FOOD AFFECT ME? Carbohydrates Carbohydrates affect your blood glucose level more than any other type of food. Your dietitian will help you determine how many carbohydrates to eat at each meal and teach you how to count carbohydrates. Counting carbohydrates is important to keep your blood glucose at a healthy level, especially if you are using insulin or taking certain medicines for diabetes mellitus. Alcohol Alcohol can cause sudden decreases in blood glucose (hypoglycemia), especially if you use insulin or take certain medicines for diabetes mellitus. Hypoglycemia can be a life-threatening condition. Symptoms of hypoglycemia (sleepiness, dizziness, and disorientation) are similar to symptoms of having too much alcohol.  If your health care provider has given you approval to drink alcohol, do so in moderation and use the following guidelines:  Women should not have more than one drink per day, and men should not have more than two drinks per day. One  drink is equal to:  12 oz of beer.  5 oz of wine.  1 oz of hard liquor.  Do not drink on an empty stomach.  Keep yourself hydrated. Have water, diet soda, or unsweetened iced tea.  Regular soda, juice, and other mixers might contain a lot of carbohydrates and should be counted. WHAT FOODS ARE NOT RECOMMENDED? As you make food choices, it is important to remember that all foods are not the same. Some foods have fewer nutrients per serving than other foods, even though they might have the same number of calories or carbohydrates. It is difficult to get your body what it needs when you eat foods with fewer nutrients. Examples of foods that you should avoid that are high in calories and carbohydrates but low in nutrients include:  Trans fats (most processed foods list trans fats on the Nutrition Facts label).  Regular soda.  Juice.  Candy.  Sweets, such as cake, pie, doughnuts, and cookies.  Fried foods. WHAT FOODS CAN I EAT? Have nutrient-rich foods, which will nourish your body and keep you healthy. The food you should eat also will depend on several factors, including:  The calories you need.  The medicines you take.  Your weight.  Your blood glucose level.  Your blood pressure level.  Your cholesterol level. You also should eat a variety of foods, including:  Protein, such as meat, poultry, fish, tofu, nuts, and seeds (lean animal proteins are best).  Fruits.  Vegetables.  Dairy products, such as milk, cheese, and yogurt (low fat is  best).  Breads, grains, pasta, cereal, rice, and beans.  Fats such as olive oil, trans fat-free margarine, canola oil, avocado, and olives. DOES EVERYONE WITH DIABETES MELLITUS HAVE THE SAME MEAL PLAN? Because every person with diabetes mellitus is different, there is not one meal plan that works for everyone. It is very important that you meet with a dietitian who will help you create a meal plan that is just right for  you. Document Released: 01/15/2005 Document Revised: 04/25/2013 Document Reviewed: 03/17/2013 [facility] Patient Information 2015 [place], [place]. This information is not intended to replace advice given to you by your health care provider. Make sure you discuss any questions you have with your health care provider.

## 2014-10-23 ENCOUNTER — Other Ambulatory Visit: Payer: Self-pay | Admitting: Internal Medicine

## 2014-10-23 MED ORDER — NATEGLINIDE 120 MG PO TABS: 120.0000 mg | ORAL_TABLET | Freq: Every day | ORAL | Status: DC

## 2014-10-23 NOTE — Telephone Encounter (Signed)
Patient is requesting a refill of nateglinide (STARLIX) 120 MG tablet [facility] [phone] Fax [phone]

## 2014-10-23 NOTE — Addendum Note (Signed)
Addended by: Townsend Roger D on: 10/23/2014 04:38 PM   Modules accepted: Orders

## 2014-11-02 ENCOUNTER — Ambulatory Visit: Payer: 59 | Admitting: Family Medicine

## 2014-12-05 ENCOUNTER — Other Ambulatory Visit: Payer: Self-pay | Admitting: Internal Medicine

## 2014-12-07 ENCOUNTER — Telehealth: Payer: Self-pay | Admitting: Internal Medicine

## 2014-12-07 NOTE — Telephone Encounter (Signed)
Patient called for a RX refill on Methylphenidate 20 mg. Please give pt a call when ready for pick up.

## 2014-12-07 NOTE — Telephone Encounter (Signed)
Ok to RF x 2

## 2014-12-10 MED ORDER — METHYLPHENIDATE HCL 20 MG PO TABS: 20.0000 mg | ORAL_TABLET | Freq: Three times a day (TID) | ORAL | Status: DC

## 2014-12-10 NOTE — Telephone Encounter (Signed)
Rx ready for pick up and patient is aware 

## 2014-12-12 ENCOUNTER — Other Ambulatory Visit (INDEPENDENT_AMBULATORY_CARE_PROVIDER_SITE_OTHER): Payer: 59

## 2014-12-12 DIAGNOSIS — E119 Type 2 diabetes mellitus without complications: Secondary | ICD-10-CM

## 2014-12-12 LAB — HEMOGLOBIN A1C: Hgb A1c MFr Bld: 7.2 % — ABNORMAL HIGH (ref 4.6–6.5)

## 2014-12-19 ENCOUNTER — Encounter: Payer: Self-pay | Admitting: Adult Health

## 2014-12-19 ENCOUNTER — Ambulatory Visit (INDEPENDENT_AMBULATORY_CARE_PROVIDER_SITE_OTHER): Payer: 59 | Admitting: Adult Health

## 2014-12-19 DIAGNOSIS — S0100XA Unspecified open wound of scalp, initial encounter: Secondary | ICD-10-CM | POA: Diagnosis not present

## 2014-12-19 DIAGNOSIS — R5383 Other fatigue: Secondary | ICD-10-CM

## 2014-12-19 DIAGNOSIS — E1165 Type 2 diabetes mellitus with hyperglycemia: Secondary | ICD-10-CM

## 2014-12-19 DIAGNOSIS — IMO0002 Reserved for concepts with insufficient information to code with codable children: Secondary | ICD-10-CM

## 2014-12-19 LAB — CBC WITH DIFFERENTIAL/PLATELET
Basophils Absolute: 0 10*3/uL (ref 0.0–0.1)
Basophils Relative: 0.4 % (ref 0.0–3.0)
Eosinophils Absolute: 0.1 10*3/uL (ref 0.0–0.7)
Eosinophils Relative: 1.8 % (ref 0.0–5.0)
HCT: 40.3 % (ref 39.0–52.0)
Hemoglobin: 13.4 g/dL (ref 13.0–17.0)
Lymphocytes Relative: 29.6 % (ref 12.0–46.0)
Lymphs Abs: 2.1 10*3/uL (ref 0.7–4.0)
MCHC: 33.3 g/dL (ref 30.0–36.0)
MCV: 96.9 fl (ref 78.0–100.0)
Monocytes Absolute: 0.6 10*3/uL (ref 0.1–1.0)
Monocytes Relative: 9.1 % (ref 3.0–12.0)
Neutro Abs: 4.2 10*3/uL (ref 1.4–7.7)
Neutrophils Relative %: 59.1 % (ref 43.0–77.0)
Platelets: 248 10*3/uL (ref 150.0–400.0)
RBC: 4.15 Mil/uL — ABNORMAL LOW (ref 4.22–5.81)
RDW: 13.8 % (ref 11.5–15.5)
WBC: 7.1 10*3/uL (ref 4.0–10.5)

## 2014-12-19 LAB — BASIC METABOLIC PANEL
BUN: 16 mg/dL (ref 6–23)
CO2: 28 mEq/L (ref 19–32)
Calcium: 9.5 mg/dL (ref 8.4–10.5)
Chloride: 105 mEq/L (ref 96–112)
Creatinine, Ser: 0.95 mg/dL (ref 0.40–1.50)
GFR: 85.5 mL/min (ref >60.00)
Glucose, Bld: 129 mg/dL — ABNORMAL HIGH (ref 70–99)
Potassium: 5 mEq/L (ref 3.5–5.1)
Sodium: 141 mEq/L (ref 135–145)

## 2014-12-19 LAB — TSH: TSH: 2.34 u[IU]/mL (ref 0.35–4.50)

## 2014-12-19 NOTE — Progress Notes (Signed)
Pre visit review using our clinic review tool, if applicable. No additional management support is needed unless otherwise documented below in the visit note. 

## 2014-12-19 NOTE — Progress Notes (Signed)
Subjective:    Patient ID: Reginald Rios, male    DOB: June 29, 1955, 59 y.o.   MRN: [id number]  HPI 59 year old male who presents to the office today for follow up regarding his diabetes. His most recent A1c is 7.2. This has dropped from 8.6. He is taking Janumet in the morning and night and starlix in the afternoon or evening. He has had two low blood sugars 64 & 70 and one high blood sugar greater than 250. He has noticed that he blood sugar drops in the afternoon after eating a salad and taking his Starlix. 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 He has changed his diet to " healthier fast food" which consists of salads and chicken. He continues to eat fast food daily. Has cut back on carbs, still on occasion has large dinners. He does feel like he is "worn out and more tired lately."  His other complaint today is that of " sores" on the top of my head. This has been an issue for 3-4 months. He does wear a hat and uses sunscreen when outside. Denies any discharge but states " they feels infected". Has put on neosporin but when they scab he picks the scab off.     Review of Systems  Constitutional: Negative.   Respiratory: Negative.   Cardiovascular: Negative.   Genitourinary: Negative.   Skin: Positive for wound.  Neurological: Negative.   Hematological: Negative.   All other systems reviewed and are negative.  Past Medical History  Diagnosis Date  . Diabetes mellitus without complication   . Exercise-induced asthma   . Arthritis   . Osteoarthritis   . Hypogonadism in male   . Low back pain   . ADD (attention deficit disorder)   . Anemia   . Heart murmur   . Hyperlipidemia     family hx of high cholesterol    Social History   Social History  . Marital Status: Married    Spouse Name: N/A  . Number of Children: N/A  . Years of Education: N/A   Occupational History  . Not on file.   Social History Main Topics  . Smoking status: Never Smoker   . Smokeless tobacco:  Never Used  . Alcohol Use: 0.0 oz/week    0 Standard drinks or equivalent per week     Comment: rare  . Drug Use: No  . Sexual Activity: Not on file   Other Topics Concern  . Not on file   Social History Narrative   Previously worked as Geophysicist/field seismologist   Currently working at [employer]   Married 27 years   2 children ages 61, 53   Wife is psychiatrist - Dr. Margurite Auerbach   Moved from [place] up in [place]    Past Surgical History  Procedure Laterality Date  . Knee arthroscopy    . Tenotomy achilles tendon      Family History  Problem Relation Age of Onset  . Lung cancer Mother 89  . Coronary artery disease Father 48  . Hypothyroidism Brother   . Colon cancer Neg Hx   . Prostate cancer Neg Hx     Allergies  Allergen Reactions  . Statins     Current Outpatient Prescriptions on File Prior to Visit  Medication Sig Dispense Refill  . canagliflozin (INVOKANA) 300 MG TABS tablet Take 300 mg by mouth daily before breakfast. 30 tablet 3  . JANUMET 50-1000 MG per tablet TAKE 1  TABLET BY MOUTH TWICE DAILY WITH A MEAL 180 tablet 1  . methylphenidate (RITALIN) 20 MG tablet Take 1 tablet (20 mg total) by mouth 3 (three) times daily. 90 tablet 0  . methylphenidate (RITALIN) 20 MG tablet Take 1 tablet (20 mg total) by mouth 3 (three) times daily. 90 tablet 0  . methylphenidate (RITALIN) 20 MG tablet Take 1 tablet (20 mg total) by mouth 3 (three) times daily. 90 tablet 0  . montelukast (SINGULAIR) 10 MG tablet Take 1 tablet (10 mg total) by mouth daily. 90 tablet 3  . nateglinide (STARLIX) 120 MG tablet Take 1 tablet (120 mg total) by mouth daily. 90 tablet 1  . pravastatin (PRAVACHOL) 40 MG tablet Take 1 tablet (40 mg total) by mouth daily. 90 tablet 1  . testosterone (ANDROGEL) 50 MG/5GM (1%) GEL Place 5 g onto the skin daily. 30 Tube 3   No current facility-administered medications on file prior to visit.    BP 122/76 mmHg  Temp(Src) 98.5 F (36.9 C) (Oral)  Ht  5\' 10"  (1.778 m)  Wt 187 lb 11.2 oz (85.14 kg)  BMI 26.93 kg/m2        Objective:   Physical Exam  Constitutional: He is oriented to person, place, and time. He appears well-developed and well-nourished. No distress.  HENT:  Head: Normocephalic and atraumatic.  Right Ear: External ear normal.  Left Ear: External ear normal.  Nose: Nose normal.  Mouth/Throat: Oropharynx is clear and moist. No oropharyngeal exudate.  Cardiovascular: Normal rate, regular rhythm, normal heart sounds and intact distal pulses.  Exam reveals no gallop and no friction rub.   No murmur heard. Pulmonary/Chest: Effort normal and breath sounds normal. No respiratory distress. He has no wheezes. He has no rales. He exhibits no tenderness.  Musculoskeletal: Normal range of motion. He exhibits no edema or tenderness.  Neurological: He is alert and oriented to person, place, and time.  Skin: Skin is warm and dry. No rash noted. He is not diaphoretic. No pallor.  Several small sores on top of head. They are not draining and appear scabbed over. They do not appear infected.   Psychiatric: He has a normal mood and affect. His behavior is normal. Judgment and thought content normal.  Nursing note and vitals reviewed.     Assessment & Plan:   1. Diabetes type 2, uncontrolled - Basic metabolic panel - Although improved he still has considerable work to do on his diet  - Advised again to stop eating at fast food restaurants - Limit portions at dinner.  - Start monitoring blood sugar at lunch.  - Continue with current medications - Follow up with Dr. [name] iin 3 months.   2. Open wound of scalp, initial encounter - Due to constant sun exposure there is some concern for malignancy.  - Likely that his sores may be from pealing skin that he picked away.  - CBC with Differential/Platelet - Ambulatory referral to Dermatology  3. Other fatigue - Unknown etiology. Likely due to constant exercise and being outside in hot  weather all day.  - Stay hydrated.  - TSH

## 2015-01-11 ENCOUNTER — Other Ambulatory Visit: Payer: Self-pay | Admitting: Internal Medicine

## 2015-01-15 ENCOUNTER — Telehealth: Payer: Self-pay | Admitting: Internal Medicine

## 2015-01-15 DIAGNOSIS — E291 Testicular hypofunction: Secondary | ICD-10-CM

## 2015-01-15 NOTE — Telephone Encounter (Signed)
Would you like labs first? Please advise

## 2015-01-15 NOTE — Telephone Encounter (Signed)
Pt needs a refill on testosterone send to [facility]

## 2015-01-16 NOTE — Telephone Encounter (Signed)
He needs total and free testosterone - 8AM and PSA but as long as he agrees to complete labs, you can send RF for testosterone - 3 month supply to pharmacy

## 2015-01-17 NOTE — Telephone Encounter (Signed)
Spoke with patient and he will call back to schedule a lab appointment.  Labs ordered. Refill called in.

## 2015-01-21 ENCOUNTER — Other Ambulatory Visit (INDEPENDENT_AMBULATORY_CARE_PROVIDER_SITE_OTHER): Payer: 59

## 2015-01-21 ENCOUNTER — Ambulatory Visit (INDEPENDENT_AMBULATORY_CARE_PROVIDER_SITE_OTHER): Payer: 59 | Admitting: Family Medicine

## 2015-01-21 ENCOUNTER — Encounter: Payer: Self-pay | Admitting: Family Medicine

## 2015-01-21 ENCOUNTER — Telehealth: Payer: Self-pay | Admitting: Family Medicine

## 2015-01-21 VITALS — BP 120/84 | HR 60 | Ht 70.0 in | Wt 192.0 lb

## 2015-01-21 DIAGNOSIS — M7551 Bursitis of right shoulder: Secondary | ICD-10-CM | POA: Diagnosis not present

## 2015-01-21 DIAGNOSIS — M25511 Pain in right shoulder: Secondary | ICD-10-CM

## 2015-01-21 DIAGNOSIS — S29012A Strain of muscle and tendon of back wall of thorax, initial encounter: Secondary | ICD-10-CM | POA: Diagnosis not present

## 2015-01-21 HISTORY — DX: Strain of muscle and tendon of back wall of thorax, initial encounter: S29.012A

## 2015-01-21 NOTE — Assessment & Plan Note (Signed)
Patient given a repeat injection today. No signs of cervical radiculopathy but this is within the differential. Negative Spurling's test today. Patient given injection the shoulder with very mild to moderate benefit in the long term. I do think that a latissimus dorsi and injuring could be possible. Not seen on ultrasound significantly except for some mild hypoechoic changes and increasing Doppler flow but no true tear appreciated. Patient given new exercises that I think will be beneficial for the scapula. Patient and will come back and see me again in 2-3 weeks to make sure he is healing appropriately.

## 2015-01-21 NOTE — Progress Notes (Signed)
[name] [place] [place], [place] [address] Phone: [phone] Subjective:    CC: right shoulder pain  back pain follow up  OYD:XAJOINOMVE Reginald Rios is a 59 y.o. male coming in with complaint of right shoulderto pain and back pain. Right shoulder-patient was seen previously and was diagnosed with more subacromial bursitis. 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 Patient was given an injection 3 months ago and was doing very well but states he did do a lot of exercises Saturday and unfortunately had worsening symptoms. States that it seems to be hurting him on the posterior aspect of the shoulder. States that it seems significant worse. Denies any neck pain that seems to be associated with it. Feels that his back is doing very well.     patient one year ago did have an MRI of his lumbar spine that shows   L5-S1: Advanced bilateral facet arthropathy with 5 mm of anterolisthesis. Bulging of the disc. Stenosis of the lateral recesses and foramina that could be symptomatic  Past Medical History  Diagnosis Date  . Diabetes mellitus without complication   . Exercise-induced asthma   . Arthritis   . Osteoarthritis   . Hypogonadism in male   . Low back pain   . ADD (attention deficit disorder)   . Anemia   . Heart murmur   . Hyperlipidemia     family hx of high cholesterol   Past Surgical History  Procedure Laterality Date  . Knee arthroscopy    . Tenotomy achilles tendon     Social History  Substance Use Topics  . Smoking status: Never Smoker   . Smokeless tobacco: Never Used  . Alcohol Use: 0.0 oz/week    0 Standard drinks or equivalent per week     Comment: rare   Allergies  Allergen Reactions  . Invokana [Canagliflozin]   . Statins     Past medical history, social, surgical and family history all reviewed in electronic medical record.   Review of Systems: No headache, visual changes, nausea, vomiting, diarrhea,  constipation, dizziness, abdominal pain, skin rash, fevers, chills, night sweats, weight loss, swollen lymph nodes, body aches, joint swelling, muscle aches, chest pain, shortness of breath, mood changes.   Objective Blood pressure 120/84, pulse 60, height 5\' 10"  (1.778 m), weight 192 lb (87.091 kg), SpO2 97 %.  General: No apparent distress alert and oriented x3 mood and affect normal, dressed appropriately.  HEENT: Pupils equal, extraocular movements intact  Respiratory: Patient's speak in full sentences and does not appear short of breath  Cardiovascular: No lower extremity edema, non tender, no erythema  Skin: Warm dry intact with no signs of infection or rash on extremities or on axial skeleton.  Abdomen: Soft nontender  Neuro: Cranial nerves II through XII are intact, neurovascularly intact in all extremities with 2+ DTRs and 2+ pulses.  Lymph: No lymphadenopathy of posterior or anterior cervical chain or axillae bilaterally.  Gait normal with good balance and coordination.  MSK:  Non tender with full range of motion and good stability and symmetric strength and tone of elbows, wrist,  knee and ankles bilaterally.  Neck: Inspection unremarkable. No palpable stepoffs. Negative Spurling's maneuver. Full neck range of motion Grip strength and sensation normal in bilateral hands Strength good C4 to T1 distribution No sensory change to C4 to T1 Negative Hoffman sign bilaterally Reflexes normal   Shoulder: Right Inspection reveals no abnormalities, atrophy or asymmetry. Pain seems to be  more on the area of the latissimus lateral to the shoulder mostly the posterior aspect of the axilla ROM is full in all planes passively. Rotator cuff strength normal throughout. signs of impingement with positive Neer and Hawkin's tests, but negative empty can sign. Speeds and Yergason's tests normal. No labral pathology noted with negative Obrien's, negative clunk and good stability. Normal  scapular function observed. No painful arc and no drop arm sign. No apprehension sign  MSK US performed of: Right This study was ordered, performed, and interpreted by Charlann Boxer D.O.  Shoulder:   Supraspinatus:  Appears normal on long and transverse views, significant increase in Doppler flow with minimal hypoechoic changes Infraspinatus:  Appears normal on long and transverse views. Significant increase in Doppler flow Subscapularis:  Appears normal on long and transverse views. Positive bursa Teres Minor:  Appears normal on long and transverse views. AC joint:  Capsule undistended, no geyser sign. Glenohumeral Joint:  Appears normal without effusion. Glenoid Labrum:  Intact without visualized tears. Biceps Tendon:  Appears normal on long and transverse views, no fraying of tendon, tendon located in intertubercular groove, no subluxation with shoulder internal or external rotation. Patient though does have significant hypoechoic changes of the insertion of the latissimus  Impression: Rotator cuff  Procedure: Real-time Ultrasound Guided Injection of right glenohumeral joint Device: GE Logiq E  Ultrasound guided injection is preferred based studies that show increased duration, increased effect, greater accuracy, decreased procedural pain, increased response rate with ultrasound guided versus blind injection.  Verbal informed consent obtained.  Time-out conducted.  Noted no overlying erythema, induration, or other signs of local infection.  Skin prepped in a sterile fashion.  Local anesthesia: Topical Ethyl chloride.  With sterile technique and under real time ultrasound guidance:  Joint visualized.  23g 1  inch needle inserted posterior approach. Pictures taken for needle placement. Patient did have injection of 2 cc of 1% lidocaine, 2 cc of 0.5% Marcaine, and 1.0 cc of Kenalog 40 mg/dL. Completed without difficulty  Pain immediately resolved suggesting accurate placement of the  medication.  Advised to call if fevers/chills, erythema, induration, drainage, or persistent bleeding.  Images permanently stored and available for review in the ultrasound unit.  Impression: Technically successful ultrasound guided injection.       Impression and Recommendations:     This case required medical decision making of moderate complexity.

## 2015-01-21 NOTE — Telephone Encounter (Signed)
Spoke to pt, scheduled him for 2pm today.

## 2015-01-21 NOTE — Telephone Encounter (Signed)
Patient is experiencing a flare up in the right shoulder. Last time, we gave him a cortisone shot. You are fully booked all week, but he did clear his schedule for today. He asks to be possibly fit in for injection. Please advise.

## 2015-01-21 NOTE — Progress Notes (Signed)
Pre visit review using our clinic review tool, if applicable. No additional management support is needed unless otherwise documented below in the visit note. 

## 2015-01-21 NOTE — Patient Instructions (Addendum)
Good to see you Start the exercises again 3 times a week Take the next 48 hours off from tennis if you can.  If painful don't do exercises.  Naprosyn 2 times daily for 3 days.  New exercises to alternate.  Increase activity slowly.  See me again in 3 weeks.  Scapular Winging  with Rehab  Scapular winging syndrome is also known as serratus anterior palsy or long thoracic nerve injury. The condition is an uncommon injury to the nervous system. The condition is caused by injury to the long thoracic nerve that runs through the neck and shoulder. Injury to the shoulder, such as a fall or repetitive stress on the shoulder causes the nerve to become stretched. Occasionally the injury is the result of an infection of the nerve. Damage to the long thoracic nerve results in weakness of the serratus anterior muscle. The serratus anterior muscle is responsible for controlling the shoulder blade (scapula). Weakness in this muscle results in a instability (winging) of the scapula. SYMPTOMS   Pain and weakness in the shoulder (usually the back of the shoulder) that is often diffuse or unable to localize.  Loss of or decrease in shoulder function.  Upper back pain while sitting, due to the scapula pressing on the back of the chair.  Visible deformity in the back of the shoulder. CAUSES  Scapular winging is caused by stretching of the long thoracic nerve. Common mechanisms of injury include:  Viral illness.  Repetitive and/or stressful use of the shoulder.  Falling onto the shoulder with the head and neck stretched away from the shoulder. RISK INCREASES WITH:  Contact sports (football, rugby, lacrosse, or soccer).  Activities involving overhead arm movement (baseball, volleyball, or racquet sports).  Poor strength and flexibility. PREVENTION  Warm up and stretch properly before activity.  Allow for adequate recovery between workouts.  Maintain physical fitness:  Strength, flexibility, and  endurance.  Cardiovascular fitness.  Learn and use proper technique. When possible, have a coach correct improper technique. PROGNOSIS  Scapular winging normally resolves spontaneously within 18 months. In rare circumstances surgery is recommended.  RELATED COMPLICATIONS   Permanent nerve damage, including pain, numbness, tingle, or weakness.  Shoulder weakness.  Recurrent shoulder pain.  Inability to compete in athletics. TREATMENT Treatment initially involves resting from any activities that aggravate your symptoms. The use of ice and medication may help reduce pain and inflammation. The use of strengthening and stretching exercises may help reduce pain with activity, specifically shoulder exercises that improve range of motion. These exercises may be performed at home or with referral to a therapist. If symptoms persist for greater than 6 months despite non-surgical (conservative) treatment, then surgery may be recommended. Surgery is only used for the most serious cases and the purpose is to regain function, not to allow an athlete to return to sports. MEDICATION   If pain medication is necessary, then nonsteroidal anti-inflammatory medications, such as aspirin and ibuprofen, or other minor pain relievers, such as acetaminophen, are often recommended.  Do not take pain medication for 7 days before surgery.  Prescription pain relievers may be given if deemed necessary by your caregiver. Use only as directed and only as much as you need. HEAT AND COLD  Cold treatment (icing) relieves pain and reduces inflammation. Cold treatment should be applied for 10 to 15 minutes every 2 to 3 hours for inflammation and pain and immediately after any activity that aggravates your symptoms. Use ice packs or massage the area with a piece  of ice (ice massage).  Heat treatment may be used prior to performing the stretching and strengthening activities prescribed by your caregiver, physical therapist, or  athletic trainer. Use a heat pack or soak the injury in warm water. SEEK MEDICAL CARE IF:  Treatment seems to offer no benefit, or the condition worsens.  Any medications produce adverse side effects. EXERCISES  RANGE OF MOTION (ROM) AND STRETCHING EXERCISES - Scapular Winging (Serratus Anterior Palsy, Long Thoracic Nerve Injury)  These exercises may help you when beginning to rehabilitate your injury. Your symptoms may resolve with or without further involvement from your physician, physical therapist or athletic trainer. While completing these exercises, remember:   Restoring tissue flexibility helps normal motion to return to the joints. This allows healthier, less painful movement and activity.  An effective stretch should be held for at least 30 seconds.  A stretch should never be painful. You should only feel a gentle lengthening or release in the stretched tissue. ROM - Pendulum  Bend at the waist so that your right / left arm falls away from your body. Support yourself with your opposite hand on a solid surface, such as a table or a countertop.  Your right / left arm should be perpendicular to the ground. If it is not perpendicular, you need to lean over farther. Relax the muscles in your right / left arm and shoulder as much as possible.  Gently sway your hips and trunk so they move your right / left arm without any use of your right / left shoulder muscles.  Progress your movements so that your right / left arm moves side to side, then forward and backward, and finally, both clockwise and counterclockwise.  Complete __________ repetitions in each direction. Many people use this exercise to relieve discomfort in their shoulder as well as to gain range of motion. Repeat __________ times. Complete this exercise __________ times per day. STRETCH - Flexion, Seated   Sit in a firm chair so that your right / left forearm can rest on a table or on a table or countertop. Your right /  left elbow should rest below the height of your shoulder so that your shoulder feels supported and not tense or uncomfortable.  Keeping your right / left shoulder relaxed, lean forward at your waist, allowing your right / left hand to slide forward. Bend forward until you feel a moderate stretch in your shoulder, but before you feel an increase in your pain.  Hold __________ seconds. Slowly return to your starting position. Repeat __________ times. Complete this exercise __________ times per day.  STRETCH - Flexion, Standing  Stand with good posture. With an underhand grip on your right / left and an overhand grip on the opposite hand, grasp a broomstick or cane so that your hands are a little more than shoulder-width apart.  Keeping your right / left elbow straight and shoulder muscles relaxed, push the stick with your opposite hand to raise your right / left arm in front of your body and then overhead. Raise your arm until you feel a stretch in your right / left shoulder, but before you have increased shoulder pain.  Avoid shrugging your right / left shoulder as your arm rises by keeping your shoulder blade tucked down and toward your mid-back spine. Hold __________ seconds.  Slowly return to the starting position. Repeat __________ times. Complete this exercise __________ times per day. STRETCH - Abduction, Supine  Stand with good posture. With an underhand grip on  your right / left and an overhand grip on the opposite hand, grasp a broomstick or cane so that your hands are a little more than shoulder-width apart.  Keeping your right / left elbow straight and shoulder muscles relaxed, push the stick with your opposite hand to raise your right / left arm out to the side of your body and then overhead. Raise your arm until you feel a stretch in your right / left shoulder, but before you have increased shoulder pain.  Avoid shrugging your right / left shoulder as your arm rises by keeping your  shoulder blade tucked down and toward your mid-back spine. Hold __________ seconds.  Slowly return to the starting position. Repeat __________ times. Complete this exercise __________ times per day. ROM - Flexion, Active-Assisted  Lie on your back. You may bend your knees for comfort.  Grasp a broomstick or cane so your hands are about shoulder-width apart. Your right / left hand should grip the end of the stick/cane so that your hand is positioned "thumbs-up," as if you were about to shake hands.  Using your healthy arm to lead, raise your right / left arm overhead until you feel a gentle stretch in your shoulder. Hold __________ seconds.  Use the stick/cane to assist in returning your right / left arm to its starting position. Repeat __________ times. Complete this exercise __________ times per day.  STRENGTHENING EXERCISES - Scapular Winging (Serratus Anterior Palsy, Long Thoracic Nerve Injury) These exercises may help you when beginning to rehabilitate your injury. They may resolve your symptoms with or without further involvement from your physician, physical therapist or athletic trainer. While completing these exercises, remember:   Muscles can gain both the endurance and the strength needed for everyday activities through controlled exercises.  Complete these exercises as instructed by your physician, physical therapist or athletic trainer. Progress with the resistance and repetition exercises only as your caregiver advises.  You may experience muscle soreness or fatigue, but the pain or discomfort you are trying to eliminate should never worsen during these exercises. If this pain does worsen, stop and make certain you are following the directions exactly. If the pain is still present after adjustments, discontinue the exercise until you can discuss the trouble with your clinician.  During your recovery, avoid activity or exercises which involve actions that place your injured hand or  elbow above your head or behind your back or head. These positions stress the tissues which are trying to heal. STRENGTH - Scapular Depression and Adduction   With good posture, sit on a firm chair. Supported your arms in front of you with pillows, arm rests or a table top. Have your elbows in line with the sides of your body.  Gently draw your shoulder blades down and toward your mid-back spine. Gradually increase the tension without tensing the muscles along the top of your shoulders and the back of your neck.  Hold for __________ seconds. Slowly release the tension and relax your muscles completely before completing the next repetition.  After you have practiced this exercise, remove the arm support and complete it in standing as well as sitting. Repeat __________ times. Complete this exercise __________ times per day.  STRENGTH - Scapular Protractors, Standing   Stand arms-length away from a wall. Place your hands on the wall, keeping your elbows straight.  Begin by dropping your shoulder blades down and toward your mid-back spine.  To strengthen your protractors, keep your shoulder blades down, but slide them  forward on your rib cage. It will feel as if you are lifting the back of your rib cage away from the wall. This is a subtle motion and can be challenging to complete. Ask your clinician for further instruction if you are not sure you are doing the exercise correctly.  Hold for __________ seconds. Slowly return to the starting position, resting the muscles completely before completing the next repetition. Repeat __________ times. Complete this exercise __________ times per day. STRENGTH - Scapular Protractors, Supine  Lie on your back on a firm surface. Extend your right / left arm straight into the air while holding a __________ weight in your hand.  Keeping your head and back in place, lift your shoulder off the floor.  Hold __________ seconds. Slowly return to the starting  position and allow your muscles to relax completely before completing the next repetition. Repeat __________ times. Complete this exercise __________ times per day. STRENGTH - Scapular Protractors, Quadruped  Get onto your hands and knees with your shoulders directly over your hands (or as close as you comfortably can be).  Keeping your elbows locked, lift the back of your rib cage up into your shoulder blades so your mid-back rounds-out. Keep your neck muscles relaxed.  Hold this position for __________ seconds. Slowly return to the starting position and allow your muscles to relax completely before completing the next repetition. Repeat __________ times. Complete this exercise __________ times per day.  STRENGTH - Scapular Depressors  Keeping your feet on the floor, lift your bottom from the seat and lock your elbows.  Keeping your elbows straight, allow gravity to pull your body weight down. Your shoulders will rise toward your ears.  Raise your body against gravity by drawing your shoulder blades down your back, shortening the distance between your shoulders and ears. Although your feet should always maintain contact with the floor, your feet should progressively support less body weight as you get stronger.  Hold __________ seconds. In a controlled and slow manner, lower your body weight to begin the next repetition. Repeat __________ times. Complete this exercise __________ times per day.  STRENGTH - Shoulder Extensors, Prone  Lie on your stomach on a firm surface so that your right / left arm overhangs the edge. Rest your forehead on your opposite forearm. With your thumb facing away from your body and your elbow straight, hold a __________ weight in your hand.  Squeeze your right / left shoulder blade to your mid-back spine and then slowly raise your arm behind you to the height of the bed.  Hold for __________ seconds. Slowly reverse the directions and return to the starting  position, controlling the weight as you lower your arm. Repeat __________ times. Complete this exercise __________ times per day.  STRENGTH - Horizontal Abductors Choose one of the two oppositions to complete this exercise. Prone: lying on stomach:  Lie on your stomach on a firm surface so that your right / left arm overhangs the edge. Rest your forehead on your opposite forearm. With your palm facing the floor and your elbow straight, hold a __________ weight in your hand.  Squeeze your right / left shoulder blade to your mid-back spine and then slowly raise your arm to the height of the bed.  Hold for __________ seconds. Slowly reverse the directions and return to the starting position, controlling the weight as you lower your arm. Repeat __________ times. Complete this exercise __________ times per day. Standing:  Secure a rubber exercise band/tubing  so that it is at the height of your shoulders when you are either standing or sitting on a firm arm-less chair.  Grasp an end of the band/tubing in each hand and have your palms face each other. Straighten your elbows and lift your hands straight in front of you at shoulder height. Step back away from the secured end of band/tubing until it becomes tense.  Squeeze your shoulder blades together. Keeping your elbows locked and your hands at shoulder-height, bring your hands out to your side.  Hold __________ seconds. Slowly ease the tension on the band/tubing as you reverse the directions and return to the starting position. Repeat __________ times. Complete this exercise __________ times per day. STRENGTH - Scapular Retractors  Secure a rubber exercise band/tubing so that it is at the height of your shoulders when you are either standing or sitting on a firm arm-less chair.  With a palm-down grip, grasp an end of the band/tubing in each hand. Straighten your elbows and lift your hands straight in front of you at shoulder height. Step back  away from the secured end of band/tubing until it becomes tense.  Squeezing your shoulder blades together, draw your elbows back as you bend them. Keep your upper arm lifted away from your body throughout the exercise.  Hold __________ seconds. Slowly ease the tension on the band/tubing as you reverse the directions and return to the starting position. Repeat __________ times. Complete this exercise __________ times per day. STRENGTH - Shoulder Extensors   Secure a rubber exercise band/tubing so that it is at the height of your shoulders when you are either standing or sitting on a firm arm-less chair.  With a thumbs-up grip, grasp an end of the band/tubing in each hand. Straighten your elbows and lift your hands straight in front of you at shoulder height. Step back away from the secured end of band/tubing until it becomes tense.  Squeezing your shoulder blades together, pull your hands down to the sides of your thighs. Do not allow your hands to go behind you.  Hold for __________ seconds. Slowly ease the tension on the band/tubing as you reverse the directions and return to the starting position. Repeat __________ times. Complete this exercise __________ times per day.  STRENGTH - Scapular Retractors and External Rotators  Secure a rubber exercise band/tubing so that it is at the height of your shoulders when you are either standing or sitting on a firm arm-less chair.  With a palm-down grip, grasp an end of the band/tubing in each hand. Bend your elbows 90 degrees and lift your elbows to shoulder height at your sides. Step back away from the secured end of band/tubing until it becomes tense.  Squeezing your shoulder blades together, rotate your shoulder so that your upper arm and elbow remain stationary, but your fists travel upward to head-height.  Hold __________ for seconds. Slowly ease the tension on the band/tubing as you reverse the directions and return to the starting  position. Repeat __________ times. Complete this exercise __________ times per day.  STRENGTH - Scapular Retractors and External Rotators, Rowing  Secure a rubber exercise band/tubing so that it is at the height of your shoulders when you are either standing or sitting on a firm arm-less chair.  With a palm-down grip, grasp an end of the band/tubing in each hand. Straighten your elbows and lift your hands straight in front of you at shoulder height. Step back away from the secured end of band/tubing until  it becomes tense.  Step 1: Squeeze your shoulder blades together. Bending your elbows, draw your hands to your chest as if you are rowing a boat. At the end of this motion, your hands and elbow should be at shoulder-height and your elbows should be out to your sides.  Step 2: Rotate your shoulder to raise your hands above your head. Your forearms should be vertical and your upper-arms should be horizontal.  Hold for __________ seconds. Slowly ease the tension on the band/tubing as you reverse the directions and return to the starting position. Repeat __________ times. Complete this exercise __________ times per day.  STRENGTH - Scapular Retractors and Elevators  Secure a rubber exercise band/tubing so that it is at the height of your shoulders when you are either standing or sitting on a firm arm-less chair.  With a thumbs-up grip, grasp an end of the band/tubing in each hand. Step back away from the secured end of band/tubing until it becomes tense.  Squeezing your shoulder blades together, straighten your elbows and lift your hands straight over your head.  Hold for __________ seconds. Slowly ease the tension on the band/tubing as you reverse the directions and return to the starting position. Repeat __________ times. Complete this exercise __________ times per day.  Document Released: 04/20/2005 Document Revised: 07/13/2011 Document Reviewed: 08/02/2008 [facility] Patient Information  2015 [place], [place]. This information is not intended to replace advice given to you by your health care provider. Make sure you discuss any questions you have with your health care provider.

## 2015-02-13 ENCOUNTER — Encounter: Payer: Self-pay | Admitting: Family Medicine

## 2015-02-13 ENCOUNTER — Ambulatory Visit (INDEPENDENT_AMBULATORY_CARE_PROVIDER_SITE_OTHER): Payer: 59 | Admitting: Family Medicine

## 2015-02-13 VITALS — BP 112/80 | HR 99 | Ht 70.0 in | Wt 187.0 lb

## 2015-02-13 DIAGNOSIS — Z794 Long term (current) use of insulin: Secondary | ICD-10-CM

## 2015-02-13 DIAGNOSIS — M7551 Bursitis of right shoulder: Secondary | ICD-10-CM

## 2015-02-13 DIAGNOSIS — E1165 Type 2 diabetes mellitus with hyperglycemia: Secondary | ICD-10-CM

## 2015-02-13 DIAGNOSIS — E1142 Type 2 diabetes mellitus with diabetic polyneuropathy: Secondary | ICD-10-CM

## 2015-02-13 DIAGNOSIS — IMO0002 Reserved for concepts with insufficient information to code with codable children: Secondary | ICD-10-CM

## 2015-02-13 DIAGNOSIS — M19042 Primary osteoarthritis, left hand: Secondary | ICD-10-CM

## 2015-02-13 DIAGNOSIS — M1990 Unspecified osteoarthritis, unspecified site: Secondary | ICD-10-CM | POA: Diagnosis not present

## 2015-02-13 MED ORDER — GABAPENTIN 100 MG PO CAPS: 200.0000 mg | ORAL_CAPSULE | Freq: Every day | ORAL | Status: DC

## 2015-02-13 NOTE — Assessment & Plan Note (Signed)
Encouraged patient to continue to monitor his blood glucose closely. We discussed possibly checking 2 times a day. May need to make some different adjustments. 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

## 2015-02-13 NOTE — Assessment & Plan Note (Signed)
Improving at this time and is doing very well. Continue to monitor closely. Patient will continue to do the exercises 2-3 times a week indefinitely.

## 2015-02-13 NOTE — Progress Notes (Signed)
Pre visit review using our clinic review tool, if applicable. No additional management support is needed unless otherwise documented below in the visit note. 

## 2015-02-13 NOTE — Assessment & Plan Note (Signed)
Vision does have arthritis but it seems that his distal IP joints are not involved. Questionable autoimmune workup may be necessary. Patient states that he has been seen by rheumatologist previously and we will get [place] lab testsIncluding autoimmune diseases and possibly workup with neurology in the future. Patient try to make the easy supplementations and the gabapentin and see if this will be beneficial.

## 2015-02-13 NOTE — Progress Notes (Signed)
[facility] [place],  [address] Phone: [phone] Subjective:    CC: right shoulder pain  back pain follow up  IRS:[id number] Reginald Rios is a 59 y.o. male coming in with complaint of right shoulderto pain  Patient was seen previously and was diagnosed with more of a subacromial bursitis as well as a rotator cuff tendinopathy with some mild intersubstance tearing. Patient was given an injection at last exam. Patient was to continue to do home exercises, icing protocol, and avoid certain activities. Patient states shoulder pain that he is having is significantly better at this time. States that he is plain tennis very well. Not having any significant pain at all.  Patient was complaining of multiple other arthralgias. States that the hands bilaterally seem to be giving him discomfort. Does have some numbness in the hands as well as somewhat of his lower extremities. Seems to be bilateral. Patient is a diabetic. Trying to keep his blood glucose under control. Does not notice any association with any medications.     patient one year ago did have an MRI of his lumbar spine that shows  patient does teach tennis.  L5-S1: Advanced bilateral facet arthropathy with 5 mm of anterolisthesis. Bulging of the disc. Stenosis of the lateral recesses and foramina that could be symptomatic  Past Medical History  Diagnosis Date  . Diabetes mellitus without complication ([place])   . Exercise-induced asthma   . Arthritis   . Osteoarthritis   . Hypogonadism in male   . Low back pain   . ADD (attention deficit disorder)   . Anemia   . Heart murmur   . Hyperlipidemia     family hx of high cholesterol   Past Surgical History  Procedure Laterality Date  . Knee arthroscopy    . Tenotomy achilles tendon     Social History  Substance Use Topics  . Smoking status: Never Smoker   . Smokeless tobacco: Never Used  . Alcohol Use: 0.0 oz/week    0  Standard drinks or equivalent per week     Comment: rare   Allergies  Allergen Reactions  . Invokana [Canagliflozin]   . Statins     Past medical history, social, surgical and family history all reviewed in electronic medical record.   Review of Systems: No headache, visual changes, nausea, vomiting, diarrhea, constipation, dizziness, abdominal pain, skin rash, fevers, chills, night sweats, weight loss, swollen lymph nodes, body aches, joint swelling, muscle aches, chest pain, shortness of breath, mood changes.   Objective Blood pressure 112/80, pulse 99, height 5\' 10"  (1.778 m), weight 187 lb (84.823 kg), SpO2 97 %.  General: No apparent distress alert and oriented x3 mood and affect normal, dressed appropriately.  HEENT: Pupils equal, extraocular movements intact  Respiratory: Patient's speak in full sentences and does not appear short of breath  Cardiovascular: No lower extremity edema, non tender, no erythema  Skin: Warm dry intact with no signs of infection or rash on extremities or on axial skeleton.  Abdomen: Soft nontender  Neuro: Cranial nerves II through XII are intact, neurovascularly intact in all extremities with 2+ DTRs and 2+ pulses.  Lymph: No lymphadenopathy of posterior or anterior cervical chain or axillae bilaterally.  Gait normal with good balance and coordination.  MSK:  Non tender with full range of motion and good stability and symmetric strength and tone of elbows, wrist,  knee and ankles bilaterally. Arthritic changes of multiple joints but  it seems to not be pain distal IP joints. Neck: Inspection unremarkable. No palpable stepoffs. Negative Spurling's maneuver. Full neck range of motion Grip strength and sensation normal in bilateral hands Strength good C4 to T1 distribution No sensory change to C4 to T1 Negative Hoffman sign bilaterally Reflexes normal Looking at patient's hands though he does have some atrophy of the thenar eminence  bilaterally   Shoulder: Right Inspection reveals no abnormalities, atrophy or asymmetry. Pain seems to be more on the area of the latissimus lateral to the shoulder mostly the posterior aspect of the axilla ROM is full in all planes passively. Rotator cuff strength normal throughout. Negative impingement signs Speeds and Yergason's tests normal. No labral pathology noted with negative Obrien's, negative clunk and good stability. Normal scapular function observed. No painful arc and no drop arm sign. No apprehension sign          Impression and Recommendations:     This case required medical decision making of moderate complexity.

## 2015-02-13 NOTE — Patient Instructions (Addendum)
Good to see you Maybe get those test results from [facility].  Turmeric 500mg  twice daily Gabapentin 100mg  at night for first week then 2 pills nightly there as well.  B12 1040mcg daily Vitamin D 2000 iU daily [facility] or [facility].  Try these changes and come back in 3 weeks.

## 2015-03-06 ENCOUNTER — Ambulatory Visit (INDEPENDENT_AMBULATORY_CARE_PROVIDER_SITE_OTHER): Payer: 59 | Admitting: Family Medicine

## 2015-03-06 ENCOUNTER — Encounter: Payer: Self-pay | Admitting: Family Medicine

## 2015-03-06 VITALS — BP 122/76 | HR 88 | Ht 70.0 in | Wt 187.0 lb

## 2015-03-06 DIAGNOSIS — M4806 Spinal stenosis, lumbar region: Secondary | ICD-10-CM

## 2015-03-06 DIAGNOSIS — M48061 Spinal stenosis, lumbar region without neurogenic claudication: Secondary | ICD-10-CM

## 2015-03-06 MED ORDER — GABAPENTIN 300 MG PO CAPS: 300.0000 mg | ORAL_CAPSULE | Freq: Every day | ORAL | Status: DC

## 2015-03-06 NOTE — Assessment & Plan Note (Signed)
Patient's symptoms do correspond more with a radiculopathy. I do think that an L5-S1 nerve root impingement is likely. Differential includes piriformis syndrome but less likely. Patient is having worsening symptoms and has not responded well to over-the-counter medications. We discussed the possibility of prednisone but secondary to patient's diabetes a do not think that this will be beneficial. Instead I think an epidural steroid injection would be better. Patient will be referred for this. Patient's MRI is a little over a year and a half full but I think it is going to still be consistent with the findings. Patient also was started on gabapentin. Encourage him to continue to do the range of motion exercises. Discussed possibly decreasing some of the tennis. Patient will come back and see me again in 2 weeks after the epidural.  Spent  25 minutes with patient face-to-face and had greater than 50% of counseling including as described above in assessment and plan.

## 2015-03-06 NOTE — Progress Notes (Signed)
[facility] [address], [place] [address] Phone: [phone] Subjective:    CC:  back pain follow up  MHD:[id number] Reginald Rios is a 59 y.o. male coming in with complaint of left neck pain. Patient states that the back pain seems to be worsening. An states that it seems to be going more on the lateral aspect on the left leg. Can go down the posterior aspect as well. Seems to be worse with activity. Better with rest. States that flexion seems to make it worse sometimes. Patient did have an MRI back in 2015 and below her the findings. Patient states that the regular exercises that he was doing previously does not seem to be working. Continues to play tennis but is finding it difficult to do such things as running. Denies any weakness but states that the numbness and radicular symptoms that he was having on the leg seems to be more chronic than it has been recently. Denies any fever, chills, any loss of weight.  Patient was complaining of multiple other arthralgias.     patient one year ago did have an MRI of his lumbar spine that shows L5-S1: Advanced bilateral facet arthropathy with 5 mm of anterolisthesis. Bulging of the disc. Stenosis of the lateral recesses and foramina that could be symptomatic  Past Medical History  Diagnosis Date  . Diabetes mellitus without complication ([place])   . Exercise-induced asthma   . Arthritis   . Osteoarthritis   . Hypogonadism in male   . Low back pain   . ADD (attention deficit disorder)   . Anemia   . Heart murmur   . Hyperlipidemia     family hx of high cholesterol   Past Surgical History  Procedure Laterality Date  . Knee arthroscopy    . Tenotomy achilles tendon     Social History  Substance Use Topics  . Smoking status: Never Smoker   . Smokeless tobacco: Never Used  . Alcohol Use: 0.0 oz/week    0 Standard drinks or equivalent per week     Comment: rare   Allergies  Allergen  Reactions  . Invokana [Canagliflozin]   . Statins     Past medical history, social, surgical and family history all reviewed in electronic medical record.   Review of Systems: No headache, visual changes, nausea, vomiting, diarrhea, constipation, dizziness, abdominal pain, skin rash, fevers, chills, night sweats, weight loss, swollen lymph nodes, body aches, joint swelling, muscle aches, chest pain, shortness of breath, mood changes.   Objective Blood pressure 122/76, pulse 88, height 5\' 10"  (1.778 m), weight 187 lb (84.823 kg), SpO2 97 %.  General: No apparent distress alert and oriented x3 mood and affect normal, dressed appropriately.  HEENT: Pupils equal, extraocular movements intact  Respiratory: Patient's speak in full sentences and does not appear short of breath  Cardiovascular: No lower extremity edema, non tender, no erythema  Skin: Warm dry intact with no signs of infection or rash on extremities or on axial skeleton.  Abdomen: Soft nontender  Neuro: Cranial nerves II through XII are intact, neurovascularly intact in all extremities with 2+ DTRs and 2+ pulses.  Lymph: No lymphadenopathy of posterior or anterior cervical chain or axillae bilaterally.  Gait normal with good balance and coordination.  MSK:  Non tender with full range of motion and good stability and symmetric strength and tone of elbows, wrist,  knee and ankles bilaterally. Arthritic changes of multiple joints but it seems  to not be pain distal IP joints. Neck: Inspection unremarkable. No palpable stepoffs. Negative Spurling's maneuver. Full neck range of motion Grip strength and sensation normal in bilateral hands Strength good C4 to T1 distribution No sensory change to C4 to T1 Negative Hoffman sign bilaterally Reflexes normal Looking at patient's hands though he does have some atrophy of the thenar eminence bilaterally   Back Exam:  Inspection: Unremarkable  Motion: Flexion 45 deg, Extension 45 deg,  Side Bending to 45 deg bilaterally,  Rotation to 45 deg bilaterally  SLR laying: Positive straight leg left side XSLR laying: Negative  Palpable tenderness: Tender to palpation in the paraspinal musculature of the lumbar spine as well as over the sacroiliac joint FABER: Positive left side Sensory change: Gross sensation intact to all lumbar and sacral dermatomes.  Reflexes: 2+ at both patellar tendons, 2+ at achilles tendons, Babinski's downgoing.  Strength at foot  Plantar-flexion: 5/5 Dorsi-flexion: 5/5 Eversion: 5/5 Inversion: 5/5  Leg strength  Quad: 5/5 Hamstring: 5/5 Hip flexor: 5/5 Hip abductors: 4/5  Gait unremarkable.          Impression and Recommendations:     This case required medical decision making of moderate complexity.

## 2015-03-06 NOTE — Progress Notes (Signed)
Pre visit review using our clinic review tool, if applicable. No additional management support is needed unless otherwise documented below in the visit note. 

## 2015-03-06 NOTE — Patient Instructions (Signed)
Good to see you Increase gabapentin 300mg  at night We will get an epidural in your back.  Tylenol 650mg  3 times a day Stay active See me again 2 after the epidural

## 2015-03-15 ENCOUNTER — Encounter: Payer: Self-pay | Admitting: Cardiology

## 2015-03-15 ENCOUNTER — Ambulatory Visit (INDEPENDENT_AMBULATORY_CARE_PROVIDER_SITE_OTHER): Payer: 59 | Admitting: Cardiology

## 2015-03-15 ENCOUNTER — Encounter: Payer: Self-pay | Admitting: *Deleted

## 2015-03-15 VITALS — BP 130/70 | HR 62 | Ht 70.0 in | Wt 187.0 lb

## 2015-03-15 DIAGNOSIS — R0609 Other forms of dyspnea: Secondary | ICD-10-CM

## 2015-03-15 DIAGNOSIS — E785 Hyperlipidemia, unspecified: Secondary | ICD-10-CM

## 2015-03-15 DIAGNOSIS — R0600 Dyspnea, unspecified: Secondary | ICD-10-CM

## 2015-03-15 MED ORDER — PRAVASTATIN SODIUM 10 MG PO TABS: 10.0000 mg | ORAL_TABLET | Freq: Every day | ORAL | Status: DC

## 2015-03-15 NOTE — Progress Notes (Signed)
Patient ID: Reginald Rios, male   DOB: [birth year], 59 y.o.   MRN: [id number]      Cardiology Office Note   Date:  03/15/2015   ID:  Reginald Rios, DOB [birth year], MRN [id number]  PCP:  Reginald Harada, MD  Cardiologist:  Dorothy Spark, MD   Chief complain: DOE, fatigue  History of Present Illness: Reginald Rios is a very pleasant 59 y.o. male , whose wife is a psychiatrist and comes in for new DOE and exertional fatigue. The patient is very active playing tennis 6x/week, hiking, still working. He has no prior cardiac history, both of his parents died of CVA in 42'.  He has h/o DM x 7 years.  He has noticed that lately he gets progressively more tired with exertion and on his most recent hike he was not able to keep up with his wife. He denies and CP, palpitations or syncope. No claudications.  He is followed by PCP< no lipid management in the past.    Past Medical History  Diagnosis Date  . Diabetes mellitus without complication ([place] [facility])   . Exercise-induced asthma   . Arthritis   . Osteoarthritis   . Hypogonadism in male   . Low back pain   . ADD (attention deficit disorder)   . Anemia   . Heart murmur   . Hyperlipidemia     family hx of high cholesterol    Past Surgical History  Procedure Laterality Date  . Knee arthroscopy    . Tenotomy achilles tendon       Current Outpatient Prescriptions  Medication Sig Dispense Refill  . gabapentin (NEURONTIN) 300 MG capsule Take 1 capsule (300 mg total) by mouth at bedtime. 30 capsule 1  . JANUMET 50-1000 MG per tablet TAKE 1 TABLET BY MOUTH TWICE DAILY WITH A MEAL 180 tablet 1  . methylphenidate (RITALIN) 20 MG tablet Take 1 tablet (20 mg total) by mouth 3 (three) times daily. (Patient taking differently: Take 20 mg by mouth daily. ) 90 tablet 0  . montelukast (SINGULAIR) 10 MG tablet Take 1 tablet (10 mg total) by mouth daily. 90 tablet 3  . nateglinide (STARLIX) 120 MG tablet Take 1 tablet (120 mg total) by mouth  daily. 90 tablet 1  . testosterone (ANDROGEL) 50 MG/5GM (1%) GEL APPLY 5 GRAMS (1 TUBE) TO EACH UPPER ARM AREA EVERY MORNING 150 g 3  . VENTOLIN HFA 108 (90 BASE) MCG/ACT inhaler Inhale 1 puff into the lungs every 6 (six) hours as needed.  1   No current facility-administered medications for this visit.    Allergies:   Invokana and Statins    Social History:  The patient  reports that he has never smoked. He has never used smokeless tobacco. He reports that he drinks alcohol. He reports that he does not use illicit drugs.   Family History:  The patient's family history includes Coronary artery disease (age of onset: 18) in his father; Hypothyroidism in his brother; Lung cancer (age of onset: 67) in his mother. There is no history of Colon cancer or Prostate cancer.   ROS:  Please see the history of present illness.   Otherwise, review of systems are positive for none.   All other systems are reviewed and negative.   PHYSICAL EXAM: VS:  BP 130/70 mmHg  Pulse 62  Ht 5\' 10"  (1.778 m)  Wt 187 lb (84.823 kg)  BMI 26.83 kg/m2 , BMI Body mass index is 26.83 kg/(m^2). GEN: Well nourished, well developed,  in no acute distress HEENT: normal Neck: no JVD, carotid bruits, or masses Cardiac: RRR; no murmurs, rubs, or gallops,no edema  Respiratory:  clear to auscultation bilaterally, normal work of breathing GI: soft, nontender, nondistended, + BS MS: no deformity or atrophy Skin: warm and dry, no rash Neuro:  Strength and sensation are intact Psych: euthymic mood, full affect  EKG: SR, LVH  Recent Labs: 12/19/2014: BUN 16; Creatinine, Ser 0.95; Hemoglobin 13.4; Platelets 248.0; Potassium 5.0; Sodium 141; TSH 2.34   Lipid Panel    Component Value Date/Time   CHOL 159 12/22/2013 1509   TRIG 62.0 12/22/2013 1509   HDL 51.80 12/22/2013 1509   CHOLHDL 3 12/22/2013 1509   VLDL 12.4 12/22/2013 1509   LDLCALC 95 12/22/2013 1509    Wt Readings from Last 3 Encounters:  03/15/15 187 lb  (84.823 kg)  03/06/15 187 lb (84.823 kg)  02/13/15 187 lb (84.823 kg)     Other studies Reviewed: Records from PCP   ASSESSMENT AND PLAN:  1. DOE, exertional fatigue - we will schedule an exercise nuclear stress test to evaluate for ischemia. He is advised to start taking daily aspirin 81 mg po daily.  2, Hyperlipidemia - last year LDL 95, goal for a diabetic < 70, also based on the most recent guidelines any diabetic patient > 45 years needs to be on moderate or high dose of highly potent statin.  He is intolerant to lipitor and crestor, we will try pravastatin 10 mg po daily.  [place]cl CMP at the next visit.  3. BP - well controlled  Follow up in 2 months.   Signed, Dorothy Spark, MD  03/15/2015 8:23 AM    [place], [place],   [address] Phone: [phone]; Fax: [phone]

## 2015-03-15 NOTE — Patient Instructions (Signed)
Medication Instructions:   Start pravastatin 10mg  daily.  Labwork: None today  Testing/Procedures: Your physician has requested that you have en exercise stress myoview. For further information please visit [email or web address]. Please follow instruction sheet, as given.    Follow-Up: Your physician recommends that you schedule a follow-up appointment in: 2 months with Dr Meda Coffee.    Any Other Special Instructions Will Be Listed Below (If Applicable).     If you need a refill on your cardiac medications before your next appointment, please call your pharmacy.

## 2015-03-25 ENCOUNTER — Telehealth (HOSPITAL_COMMUNITY): Payer: Self-pay | Admitting: *Deleted

## 2015-03-25 NOTE — Telephone Encounter (Signed)
Patient given detailed instructions per Myocardial Perfusion Study Information Sheet for the test on 03/27/15 at 0745. Patient notified to arrive 15 minutes early and that it is imperative to arrive on time for appointment to keep from having the test rescheduled.  If you need to cancel or reschedule your appointment, please call the office within 24 hours of your appointment. Failure to do so may result in a cancellation of your appointment, and a $50 no show fee. Patient verbalized understanding.Reginald Rios, [place]anae [place]

## 2015-03-26 ENCOUNTER — Telehealth: Payer: Self-pay | Admitting: Family Medicine

## 2015-03-26 DIAGNOSIS — M5416 Radiculopathy, lumbar region: Secondary | ICD-10-CM

## 2015-03-26 MED ORDER — GABAPENTIN 100 MG PO CAPS: 200.0000 mg | ORAL_CAPSULE | Freq: Every day | ORAL | Status: DC

## 2015-03-26 NOTE — Telephone Encounter (Signed)
Pt made aware

## 2015-03-26 NOTE — Telephone Encounter (Signed)
Sent in 100mg  tabs at night Could do 1-2 tabs nightly.

## 2015-03-26 NOTE — Telephone Encounter (Signed)
11.22.16 Pt is requesting a lower dosage of the gabapentin. Please call with any questions. MS

## 2015-03-27 ENCOUNTER — Ambulatory Visit (HOSPITAL_COMMUNITY): Payer: 59 | Attending: Cardiology

## 2015-03-27 DIAGNOSIS — R5383 Other fatigue: Secondary | ICD-10-CM | POA: Diagnosis not present

## 2015-03-27 DIAGNOSIS — Z8249 Family history of ischemic heart disease and other diseases of the circulatory system: Secondary | ICD-10-CM | POA: Insufficient documentation

## 2015-03-27 DIAGNOSIS — R0600 Dyspnea, unspecified: Secondary | ICD-10-CM

## 2015-03-27 DIAGNOSIS — E119 Type 2 diabetes mellitus without complications: Secondary | ICD-10-CM | POA: Diagnosis not present

## 2015-03-27 DIAGNOSIS — R0609 Other forms of dyspnea: Secondary | ICD-10-CM | POA: Diagnosis present

## 2015-03-27 LAB — MYOCARDIAL PERFUSION IMAGING
Estimated workload: 10.4 METS
Exercise duration (min): 9 min
Exercise duration (sec): 0 s
LV dias vol: 90 mL
LV sys vol: 43 mL
MPHR: 159 {beats}/min
Peak HR: 151 {beats}/min
Percent HR: 94 %
RATE: 0.27
Rest HR: 60 {beats}/min
SDS: 1
SRS: 1
SSS: 2
TID: 0.95

## 2015-03-27 MED ORDER — TECHNETIUM TC 99M SESTAMIBI GENERIC - CARDIOLITE
32.8000 | Freq: Once | INTRAVENOUS | Status: AC | PRN
  Administered 2015-03-27: 32.8 via INTRAVENOUS

## 2015-03-27 MED ORDER — TECHNETIUM TC 99M SESTAMIBI GENERIC - CARDIOLITE
11.0000 | Freq: Once | INTRAVENOUS | Status: AC | PRN
  Administered 2015-03-27: 11 via INTRAVENOUS

## 2015-04-02 ENCOUNTER — Telehealth: Payer: Self-pay | Admitting: *Deleted

## 2015-04-02 DIAGNOSIS — R931 Abnormal findings on diagnostic imaging of heart and coronary circulation: Secondary | ICD-10-CM

## 2015-04-02 HISTORY — DX: Abnormal findings on diagnostic imaging of heart and coronary circulation: R93.1

## 2015-04-02 NOTE — Telephone Encounter (Signed)
-----   Message from Dorothy Spark, MD sent at 04/01/2015 10:52 AM EST ----- Normal stress test, but possibly low LVEF, please schedule an echocardiogram.

## 2015-04-02 NOTE — Telephone Encounter (Signed)
Notified the pt that per Dr Meda Coffee his stress test was normal, but possibly low LVEF, and she recommends that he have an echo scheduled to assess his this.  Informed the pt that I will place the order in the system and send our [facility] schedulers a message to call him back to have this scheduled.  Pt request that the scheduler call him back on Monday 04/08/15, for he is currently on vacation at [facility] until Sunday.  Pt verbalized understanding and agrees with this plan.

## 2015-04-16 ENCOUNTER — Ambulatory Visit
Admission: RE | Admit: 2015-04-16 | Discharge: 2015-04-16 | Disposition: A | Discharge status: Home / Self Care | Payer: 59 | Source: Ambulatory Visit | Attending: Family Medicine | Admitting: Family Medicine

## 2015-04-16 DIAGNOSIS — M5416 Radiculopathy, lumbar region: Secondary | ICD-10-CM

## 2015-04-16 MED ORDER — IOHEXOL 180 MG/ML  SOLN
1.0000 mL | Freq: Once | INTRAMUSCULAR | Status: AC | PRN
  Administered 2015-04-16: 1 mL via EPIDURAL

## 2015-04-16 MED ORDER — METHYLPREDNISOLONE ACETATE 40 MG/ML INJ SUSP (RADIOLOG
120.0000 mg | Freq: Once | INTRAMUSCULAR | Status: AC
  Administered 2015-04-16: 120 mg via EPIDURAL

## 2015-04-16 NOTE — Discharge Instructions (Signed)

## 2015-04-22 ENCOUNTER — Other Ambulatory Visit (HOSPITAL_COMMUNITY): Payer: 59

## 2015-05-02 ENCOUNTER — Ambulatory Visit (HOSPITAL_COMMUNITY): Payer: 59 | Attending: Cardiovascular Disease

## 2015-05-02 ENCOUNTER — Other Ambulatory Visit: Payer: Self-pay

## 2015-05-02 ENCOUNTER — Telehealth: Payer: Self-pay

## 2015-05-02 DIAGNOSIS — I517 Cardiomegaly: Secondary | ICD-10-CM | POA: Diagnosis not present

## 2015-05-02 DIAGNOSIS — E785 Hyperlipidemia, unspecified: Secondary | ICD-10-CM | POA: Insufficient documentation

## 2015-05-02 DIAGNOSIS — R931 Abnormal findings on diagnostic imaging of heart and coronary circulation: Secondary | ICD-10-CM | POA: Diagnosis not present

## 2015-05-02 DIAGNOSIS — E119 Type 2 diabetes mellitus without complications: Secondary | ICD-10-CM | POA: Diagnosis not present

## 2015-05-02 MED ORDER — LOSARTAN POTASSIUM 25 MG PO TABS: 25.0000 mg | ORAL_TABLET | Freq: Every day | ORAL | Status: DC

## 2015-05-02 NOTE — Telephone Encounter (Signed)
-----   Message from Dorothy Spark, MD sent at 05/02/2015  4:01 PM EST ----- He does have mildly decreased LVEF, i would start him on low dose losartan 25 mg po daily and check BMP in 1 month.

## 2015-05-02 NOTE — Telephone Encounter (Signed)
Informed patient of results and verbal understanding expressed.  Instructed patient to START LOSARTAN 25 mg daily. BMP scheduled for 1/30. Patient agrees with treatment plan.

## 2015-05-10 MED FILL — METHYLPHENIDATE 20 MG TAB: 20 | 30 days supply | Qty: 90 | Fill #0

## 2015-05-15 ENCOUNTER — Encounter: Payer: Self-pay | Admitting: Cardiology

## 2015-05-15 ENCOUNTER — Ambulatory Visit (INDEPENDENT_AMBULATORY_CARE_PROVIDER_SITE_OTHER): Payer: 59 | Admitting: Cardiology

## 2015-05-15 VITALS — BP 120/80 | HR 70 | Ht 70.0 in | Wt 193.8 lb

## 2015-05-15 DIAGNOSIS — R0600 Dyspnea, unspecified: Secondary | ICD-10-CM | POA: Insufficient documentation

## 2015-05-15 DIAGNOSIS — R5383 Other fatigue: Secondary | ICD-10-CM

## 2015-05-15 DIAGNOSIS — I428 Other cardiomyopathies: Secondary | ICD-10-CM

## 2015-05-15 DIAGNOSIS — E785 Hyperlipidemia, unspecified: Secondary | ICD-10-CM | POA: Diagnosis not present

## 2015-05-15 DIAGNOSIS — F32A Depression, unspecified: Secondary | ICD-10-CM

## 2015-05-15 DIAGNOSIS — R931 Abnormal findings on diagnostic imaging of heart and coronary circulation: Secondary | ICD-10-CM

## 2015-05-15 DIAGNOSIS — I429 Cardiomyopathy, unspecified: Secondary | ICD-10-CM

## 2015-05-15 DIAGNOSIS — F329 Major depressive disorder, single episode, unspecified: Secondary | ICD-10-CM

## 2015-05-15 DIAGNOSIS — R0609 Other forms of dyspnea: Secondary | ICD-10-CM

## 2015-05-15 HISTORY — DX: Other cardiomyopathies: I42.8

## 2015-05-15 HISTORY — DX: Other forms of dyspnea: R06.09

## 2015-05-15 HISTORY — DX: Other fatigue: R53.83

## 2015-05-15 MED ORDER — TADALAFIL 10 MG PO TABS: 10.0000 mg | ORAL_TABLET | Freq: Every day | ORAL | Status: DC | PRN

## 2015-05-15 MED ORDER — LISINOPRIL 2.5 MG PO TABS: 2.5000 mg | ORAL_TABLET | Freq: Every day | ORAL | Status: DC

## 2015-05-15 MED FILL — CIALIS 10 MG TABLET: 10 | 30 days supply | Qty: 6 | Fill #0

## 2015-05-15 MED FILL — LISINOPRIL 2.5 MG TABLET: 2.5 | 90 days supply | Qty: 90 | Fill #0 | Status: TO

## 2015-05-15 NOTE — Patient Instructions (Signed)
Medication Instructions:   STOP TAKING PRAVASTATIN NOW  STOP TAKING LOSARTAN NOW  START TAKING LISINOPRIL 2.5 MG ONCE DAILY  DR NELSON HAS PRESCRIBED YOU CIALIS 10 MG TO TAKE ONLY AS NEEDED FOR ERECTILE DYSFUNCTION---PLEASE FOLLOW THE INSTRUCTIONS ON THE BOTTLE CAREFULLY    Testing/Procedures:  Your physician has requested that you have an echocardiogram. Echocardiography is a painless test that uses sound waves to create images of your heart. It provides your doctor with information about the size and shape of your heart and how well your heart's chambers and valves are working. This procedure takes approximately one hour. There are no restrictions for this procedure.  PER DR NELSON THIS SHOULD BE SCHEDULED PRIOR TO YOUR 2 MONTH FOLLOW-UP APPOINTMENT WITH HER    Follow-Up:  2 MONTHS WITH DR NELSON---PLEASE HAVE YOUR ECHO SCHEDULED PRIOR TO THIS APPOINTMENT     If you need a refill on your cardiac medications before your next appointment, please call your pharmacy.

## 2015-05-15 NOTE — Progress Notes (Signed)
Patient ID: Reginald Rios, male   DOB: 03-13-1956, 59 y.o.   MRN: [id number]      Cardiology Office Note   Date:  05/15/2015   ID:  Reginald Rios, DOB [birth year], MRN [id number]  PCP:  Anthoney Harada, MD  Cardiologist:  Dorothy Spark, MD   Chief complain: DOE, fatigue  History of Present Illness: Reginald Rios is a very pleasant 60 y.o. male , whose wife is a psychiatrist and comes in for new DOE and exertional fatigue. The patient is very active playing tennis 6x/week, hiking, still working. He has no prior cardiac history, both of his parents died of CVA in 28'.  He has h/o DM x 7 years.  He has noticed that lately he gets progressively more tired with exertion and on his most recent hike he was not able to keep up with his wife. He denies and CP, palpitations or syncope. No claudications.  He is followed by PCP, no lipid management in the past.   05/15/2015 - this is 2 months follow-up, the patient underwent stress testing that showed no ischemia but decreased ejection fraction that was confirmed on the echocardiogram with LVEF 45-50%. He was prescribed pravastatin and losartan. Today he states that he feels like he has even less energy and some days he just doesn't seem like doing anything. His wife believes he is being depressed. He doesn't remember being depressed before. He denies any chest pain or shortness of breath. He continues to play tennis but only 3 times a week not 7 times a week. He uses Ritalin multiple times a day for energy especially before playing tennis. He denies any orthopnea, paroxysmal nocturnal dyspnea or lower extremity edema.   Past Medical History  Diagnosis Date  . Diabetes mellitus without complication ([place])   . Exercise-induced asthma   . Arthritis   . Osteoarthritis   . Hypogonadism in male   . Low back pain   . ADD (attention deficit disorder)   . Anemia   . Heart murmur   . Hyperlipidemia     family hx of high cholesterol     Past Surgical History  Procedure Laterality Date  . Knee arthroscopy    . Tenotomy achilles tendon       Current Outpatient Prescriptions  Medication Sig Dispense Refill  . gabapentin (NEURONTIN) 100 MG capsule Take 100 mg by mouth at bedtime.  1  . JANUMET 50-1000 MG per tablet TAKE 1 TABLET BY MOUTH TWICE DAILY WITH A MEAL 180 tablet 1  . losartan (COZAAR) 25 MG tablet Take 1 tablet (25 mg total) by mouth daily. 30 tablet 11  . methylphenidate (RITALIN) 20 MG tablet Take 20 mg by mouth daily.    . montelukast (SINGULAIR) 10 MG tablet Take 1 tablet (10 mg total) by mouth daily. 90 tablet 3  . nateglinide (STARLIX) 120 MG tablet Take 1 tablet (120 mg total) by mouth daily. 90 tablet 1  . pravastatin (PRAVACHOL) 10 MG tablet Take 1 tablet (10 mg total) by mouth daily. 30 tablet 3  . testosterone (ANDROGEL) 50 MG/5GM (1%) GEL APPLY 5 GRAMS (1 TUBE) TO EACH UPPER ARM AREA EVERY MORNING 150 g 3  . VENTOLIN HFA 108 (90 BASE) MCG/ACT inhaler Inhale 1 puff into the lungs every 6 (six) hours as needed (cough).   1   No current facility-administered medications for this visit.    Allergies:   Invokana and Statins    Social History:  The patient  reports that  he has never smoked. He has never used smokeless tobacco. He reports that he drinks alcohol. He reports that he does not use illicit drugs.   Family History:  The patient's family history includes Coronary artery disease (age of onset: 27) in his father; Hypothyroidism in his brother; Lung cancer (age of onset: 39) in his mother. There is no history of Colon cancer or Prostate cancer.   ROS:  Please see the history of present illness.   Otherwise, review of systems are positive for none.   All other systems are reviewed and negative.   PHYSICAL EXAM: VS:  BP 120/80 mmHg  Pulse 70  Ht 5\' 10"  (1.778 m)  Wt 193 lb 12.8 oz (87.907 kg)  BMI 27.81 kg/m2 , BMI Body mass index is 27.81 kg/(m^2). GEN: Well nourished, well developed, in  no acute distress HEENT: normal Neck: no JVD, carotid bruits, or masses Cardiac: RRR; no murmurs, rubs, or gallops,no edema  Respiratory:  clear to auscultation bilaterally, normal work of breathing GI: soft, nontender, nondistended, + BS MS: no deformity or atrophy Skin: warm and dry, no rash Neuro:  Strength and sensation are intact Psych: euthymic mood, full affect  EKG: SR, LVH  Recent Labs: 12/19/2014: BUN 16; Creatinine, Ser 0.95; Hemoglobin 13.4; Platelets 248.0; Potassium 5.0; Sodium 141; TSH 2.34   Lipid Panel    Component Value Date/Time   CHOL 159 12/22/2013 1509   TRIG 62.0 12/22/2013 1509   HDL 51.80 12/22/2013 1509   CHOLHDL 3 12/22/2013 1509   VLDL 12.4 12/22/2013 1509   LDLCALC 95 12/22/2013 1509    Wt Readings from Last 3 Encounters:  05/15/15 193 lb 12.8 oz (87.907 kg)  03/27/15 187 lb (84.823 kg)  03/15/15 187 lb (84.823 kg)    Other studies Reviewed: Records from PCP  Nuclear stress test:  Nuclear stress EF: 52%.  There was no ST segment deviation noted during stress.  The left ventricular ejection fraction is mildly decreased (45-54%).  This is a low risk study.  No ischemia Thinning of the mid and basal inferior wall with normal motion consistent with diaphragmatic attenuation EF 52%    ASSESSMENT AND PLAN:  1. DOE, exertional fatigue - normal exercise nuclear stress test with no evidence of infarct or ischemia. However decreased in LVEF. This was confirmed on echocardiogram with diffuse hypokinesis with LVEF 45-50%.  2. Nonischemic cardiomyopathy - the patient was starting on losartan however feels that he is being depressed we'll discontinue and start lisinopril 2.5 mg daily as these might be a consequence of low blood blood pressure.  3. Depression - we'll discontinue pravastatin losartan and see if there is any difference.  4. Hyperlipidemia - last year LDL 95, goal for a diabetic < 70, also based on the most recent guidelines any  diabetic patient > 45 years needs to be on moderate or high dose of highly potent statin. As he was intolerant to lipitor and crestor he was started on pravastatin 10 mg po daily, however there is concern about possible depression as a side effect we will hold and see if that makes any difference..  [place] CMP at the next visit.  Follow up in 2 months.   Signed, Dorothy Spark, MD  05/15/2015 9:42 AM    [facility] [address], [place], [place]  [address] Phone: [phone]; Fax: [phone]

## 2015-05-17 MED FILL — JANUMET 50-1,000 MG TABLET: 50-1000 | 30 days supply | Qty: 60 | Fill #0

## 2015-05-20 DIAGNOSIS — N529 Male erectile dysfunction, unspecified: Secondary | ICD-10-CM | POA: Diagnosis not present

## 2015-05-20 DIAGNOSIS — R5383 Other fatigue: Secondary | ICD-10-CM | POA: Diagnosis not present

## 2015-05-20 DIAGNOSIS — E119 Type 2 diabetes mellitus without complications: Secondary | ICD-10-CM | POA: Diagnosis not present

## 2015-05-20 DIAGNOSIS — E291 Testicular hypofunction: Secondary | ICD-10-CM | POA: Diagnosis not present

## 2015-05-20 DIAGNOSIS — R0609 Other forms of dyspnea: Secondary | ICD-10-CM | POA: Diagnosis not present

## 2015-05-20 DIAGNOSIS — E663 Overweight: Secondary | ICD-10-CM | POA: Diagnosis not present

## 2015-05-21 DIAGNOSIS — M9902 Segmental and somatic dysfunction of thoracic region: Secondary | ICD-10-CM | POA: Diagnosis not present

## 2015-05-21 DIAGNOSIS — M9901 Segmental and somatic dysfunction of cervical region: Secondary | ICD-10-CM | POA: Diagnosis not present

## 2015-05-21 DIAGNOSIS — M542 Cervicalgia: Secondary | ICD-10-CM | POA: Diagnosis not present

## 2015-05-21 DIAGNOSIS — M4712 Other spondylosis with myelopathy, cervical region: Secondary | ICD-10-CM | POA: Diagnosis not present

## 2015-05-28 DIAGNOSIS — J069 Acute upper respiratory infection, unspecified: Secondary | ICD-10-CM | POA: Diagnosis not present

## 2015-05-31 MED FILL — TESTOSTERONE 50 MG/5 GRAM G: 50 MG/5GM | 30 days supply | Qty: 150 | Fill #0

## 2015-06-03 ENCOUNTER — Other Ambulatory Visit (INDEPENDENT_AMBULATORY_CARE_PROVIDER_SITE_OTHER): Payer: 59 | Admitting: *Deleted

## 2015-06-03 DIAGNOSIS — R9439 Abnormal result of other cardiovascular function study: Secondary | ICD-10-CM | POA: Diagnosis not present

## 2015-06-03 DIAGNOSIS — R931 Abnormal findings on diagnostic imaging of heart and coronary circulation: Secondary | ICD-10-CM

## 2015-06-03 LAB — BASIC METABOLIC PANEL
BUN: 13 mg/dL (ref 7–25)
CO2: 26 mmol/L (ref 20–31)
Calcium: 9.5 mg/dL (ref 8.6–10.3)
Chloride: 102 mmol/L (ref 98–110)
Creat: 0.69 mg/dL — ABNORMAL LOW (ref 0.70–1.25)
Glucose, Bld: 123 mg/dL — ABNORMAL HIGH (ref 65–99)
Potassium: 4.2 mmol/L (ref 3.5–5.3)
Sodium: 137 mmol/L (ref 135–146)

## 2015-06-03 NOTE — Addendum Note (Signed)
Addended by: Eulis Foster on: 06/03/2015 11:13 AM   Modules accepted: Orders

## 2015-06-21 MED FILL — JANUMET 50-1,000 MG TABLET: 50-1000 | 90 days supply | Qty: 180 | Fill #0 | Status: TO

## 2015-07-03 ENCOUNTER — Other Ambulatory Visit: Payer: Self-pay

## 2015-07-03 ENCOUNTER — Ambulatory Visit (HOSPITAL_COMMUNITY): Payer: 59 | Attending: Cardiology

## 2015-07-03 DIAGNOSIS — R931 Abnormal findings on diagnostic imaging of heart and coronary circulation: Secondary | ICD-10-CM | POA: Insufficient documentation

## 2015-07-03 DIAGNOSIS — E785 Hyperlipidemia, unspecified: Secondary | ICD-10-CM | POA: Insufficient documentation

## 2015-07-03 DIAGNOSIS — D649 Anemia, unspecified: Secondary | ICD-10-CM | POA: Diagnosis not present

## 2015-07-03 DIAGNOSIS — E119 Type 2 diabetes mellitus without complications: Secondary | ICD-10-CM | POA: Insufficient documentation

## 2015-07-03 DIAGNOSIS — I340 Nonrheumatic mitral (valve) insufficiency: Secondary | ICD-10-CM | POA: Insufficient documentation

## 2015-07-03 DIAGNOSIS — M1990 Unspecified osteoarthritis, unspecified site: Secondary | ICD-10-CM | POA: Diagnosis not present

## 2015-07-03 MED FILL — TESTOSTERONE 50 MG/5 GRAM G: 50 MG/5GM | 30 days supply | Qty: 150 | Fill #1

## 2015-07-07 ENCOUNTER — Emergency Department (HOSPITAL_COMMUNITY)
Admission: EM | Admit: 2015-07-07 | Discharge: 2015-07-07 | Disposition: A | Discharge status: Home / Self Care | Payer: 59 | Source: Home / Self Care | Attending: Family Medicine | Admitting: Family Medicine

## 2015-07-07 ENCOUNTER — Encounter (HOSPITAL_COMMUNITY): Payer: Self-pay | Admitting: Emergency Medicine

## 2015-07-07 DIAGNOSIS — R05 Cough: Secondary | ICD-10-CM | POA: Diagnosis not present

## 2015-07-07 DIAGNOSIS — R059 Cough, unspecified: Secondary | ICD-10-CM

## 2015-07-07 NOTE — ED Provider Notes (Signed)
CSN: [id number]     Arrival date & time 07/07/15  1847 History   First MD Initiated Contact with Patient 07/07/15 1952     Chief Complaint  Patient presents with  . 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 Pain score: Other symptoms include:dry non productive cough  Past Medical History  Diagnosis Date  . Diabetes mellitus without complication ([place])   . Exercise-induced asthma   . Arthritis   . Osteoarthritis   . Hypogonadism in male   . Low back pain   . ADD (attention deficit disorder)   . Anemia   . Heart murmur   . Hyperlipidemia     family hx of high cholesterol   Past Surgical History  Procedure Laterality Date  . Knee arthroscopy    . Tenotomy achilles tendon     Family History  Problem Relation Age of Onset  . Lung cancer Mother 43  . Coronary artery disease Father 38  . Hypothyroidism Brother   . Colon cancer Neg Hx   . Prostate cancer Neg Hx    Social History  Substance Use Topics  . Smoking status: Never Smoker   . Smokeless tobacco: Never Used  . Alcohol Use: 0.0 oz/week    0 Standard drinks or equivalent per week     Comment: rare    Review of Systems Cough  Allergies  Invokana and Statins  Home Medications   Prior to Admission medications   Medication Sig Start Date End Date Taking? Authorizing Provider  gabapentin (NEURONTIN) 100 MG capsule Take 100 mg by mouth at bedtime. 03/26/15  Yes Historical Provider, MD  JANUMET 50-1000 MG per tablet TAKE 1 TABLET BY MOUTH TWICE DAILY WITH A MEAL 10/24/14  Yes Doe-Hyun R Yoo, DO  lisinopril (PRINIVIL,ZESTRIL) 2.5 MG tablet Take 1 tablet (2.5 mg total) by mouth daily. 05/15/15  Yes Dorothy Spark, MD  methylphenidate (RITALIN) 20 MG tablet Take 20 mg by mouth daily.   Yes Historical Provider, MD  montelukast (SINGULAIR) 10 MG tablet Take 1 tablet (10 mg total) by mouth daily. 06/01/14  Yes Marletta Lor, MD  testosterone (ANDROGEL) 50 MG/5GM (1%) GEL APPLY 5 GRAMS (1 TUBE) TO EACH UPPER ARM AREA EVERY MORNING 01/16/15  Yes Doe-Hyun R Yoo, DO  VENTOLIN HFA 108 (90 BASE) MCG/ACT inhaler Inhale 1 puff into the lungs every 6 (six) hours as needed (cough).  02/26/15  Yes Historical Provider, MD  nateglinide (STARLIX) 120 MG tablet Take 1 tablet (120 mg total) by mouth daily. 10/23/14   Doe-Hyun R [name], DO  tadalafil (CIALIS) 10 MG tablet Take 1 tablet (10 mg total) by mouth daily as needed for erectile dysfunction. 05/15/15   Dorothy Spark, MD   Meds Ordered and Administered this Visit  Medications - No data to display  BP 117/73 mmHg  Pulse 83  Temp(Src) 98.3 F (36.8 C) (Oral)  Resp 18  SpO2 97% No data found.   Physical Exam NURSES NOTES AND VITAL SIGNS REVIEWED. CONSTITUTIONAL: Well developed, well nourished, no acute distress HEENT: normocephalic, atraumatic, right and left TM's are normal EYES: Conjunctiva normal NECK:normal ROM, supple, no adenopathy PULMONARY:No respiratory distress, normal effort, Lungs: CTAb/l, no wheezes, or increased work of breathing CARDIOVASCULAR: RRR,  no murmur ABDOMEN: soft, ND, NT, +'ve BS MUSCULOSKELETAL: Normal ROM of all extremities,  SKIN: warm and dry without rash PSYCHIATRIC: Mood and affect, behavior are normal  ED Course  Procedures (including critical care time)  Labs Review Labs Reviewed - No data to display  Imaging Review No results found.   Visual Acuity Review  Right Eye Distance:   Left Eye Distance:   Bilateral Distance:    Right Eye Near:   Left Eye Near:    Bilateral Near:         MDM   1. Cough    Patient is reassured that there is no indication for more advance testing at this time.   Patient is advised to continue home symptomatic treatment.  Patient is advised that if there are new or worsening symptoms or attend the emergency department, or contact primary care provider. Instructions of care provided discharged home in stable condition. Return to work/school note provided.  THIS NOTE WAS GENERATED USING A VOICE RECOGNITION SOFTWARE PROGRAM. ALL REASONABLE EFFORTS  WERE MADE TO PROOFREAD THIS DOCUMENT FOR ACCURACY.     Konrad Felix, [place] 07/07/15 2251

## 2015-07-07 NOTE — Discharge Instructions (Signed)

## 2015-07-07 NOTE — ED Notes (Signed)
The patient presented to the [facility] with a complaint of a cough that has been ongoing for 1 month...but has gotten progressively worse.

## 2015-07-08 DIAGNOSIS — R05 Cough: Secondary | ICD-10-CM | POA: Diagnosis not present

## 2015-07-08 DIAGNOSIS — J988 Other specified respiratory disorders: Secondary | ICD-10-CM | POA: Diagnosis not present

## 2015-07-08 MED FILL — CHERATUSSIN AC SYRUP: 100-10 | 10 days supply | Qty: 200 | Fill #0

## 2015-07-08 MED FILL — OSELTAMIVIR PHOS 75 MG CAP: 75 | 5 days supply | Qty: 10 | Fill #0

## 2015-07-08 MED FILL — VENTOLIN HFA 90 MCG INHALER: 108 (90 BAS | 16 days supply | Qty: 18 | Fill #0

## 2015-07-08 MED FILL — ADVAIR 250/50 DISKUS: 250-50 | 30 days supply | Qty: 60 | Fill #0

## 2015-07-12 ENCOUNTER — Ambulatory Visit: Payer: 59 | Admitting: Cardiology

## 2015-07-12 DIAGNOSIS — J069 Acute upper respiratory infection, unspecified: Secondary | ICD-10-CM | POA: Diagnosis not present

## 2015-07-12 DIAGNOSIS — F988 Other specified behavioral and emotional disorders with onset usually occurring in childhood and adolescence: Secondary | ICD-10-CM | POA: Diagnosis not present

## 2015-07-12 DIAGNOSIS — E663 Overweight: Secondary | ICD-10-CM | POA: Diagnosis not present

## 2015-07-12 DIAGNOSIS — J45909 Unspecified asthma, uncomplicated: Secondary | ICD-10-CM | POA: Diagnosis not present

## 2015-07-29 DIAGNOSIS — H524 Presbyopia: Secondary | ICD-10-CM | POA: Diagnosis not present

## 2015-07-29 DIAGNOSIS — E113299 Type 2 diabetes mellitus with mild nonproliferative diabetic retinopathy without macular edema, unspecified eye: Secondary | ICD-10-CM | POA: Diagnosis not present

## 2015-07-31 MED FILL — METHYLPHENIDATE 20 MG TAB: 20 | 30 days supply | Qty: 60 | Fill #0

## 2015-08-08 MED FILL — TESTOSTERONE 50 MG/5 GRAM G: 50 MG/5GM | 30 days supply | Qty: 150 | Fill #2 | Status: TO

## 2015-08-20 ENCOUNTER — Ambulatory Visit: Payer: 59 | Admitting: Cardiology

## 2015-08-26 MED FILL — LISINOPRIL 2.5 MG TABLET: 2.5 | 90 days supply | Qty: 90 | Fill #0

## 2015-08-28 ENCOUNTER — Telehealth: Payer: Self-pay

## 2015-08-28 ENCOUNTER — Ambulatory Visit (INDEPENDENT_AMBULATORY_CARE_PROVIDER_SITE_OTHER): Payer: 59 | Admitting: Cardiology

## 2015-08-28 ENCOUNTER — Encounter: Payer: Self-pay | Admitting: Cardiology

## 2015-08-28 VITALS — BP 126/62 | HR 63 | Ht 70.0 in | Wt 181.0 lb

## 2015-08-28 DIAGNOSIS — F329 Major depressive disorder, single episode, unspecified: Secondary | ICD-10-CM

## 2015-08-28 DIAGNOSIS — R0609 Other forms of dyspnea: Secondary | ICD-10-CM | POA: Diagnosis not present

## 2015-08-28 DIAGNOSIS — R0600 Dyspnea, unspecified: Secondary | ICD-10-CM

## 2015-08-28 DIAGNOSIS — F32A Depression, unspecified: Secondary | ICD-10-CM

## 2015-08-28 DIAGNOSIS — E785 Hyperlipidemia, unspecified: Secondary | ICD-10-CM

## 2015-08-28 DIAGNOSIS — I429 Cardiomyopathy, unspecified: Secondary | ICD-10-CM | POA: Diagnosis not present

## 2015-08-28 DIAGNOSIS — I428 Other cardiomyopathies: Secondary | ICD-10-CM

## 2015-08-28 MED ORDER — VALSARTAN 80 MG PO TABS: 80.0000 mg | ORAL_TABLET | Freq: Every day | ORAL | Status: DC

## 2015-08-28 NOTE — Telephone Encounter (Signed)
Called Dr.Wong's office @ [facility]. They will fax over copies of the pt's recent labs, attn: Dr.Nelson

## 2015-08-28 NOTE — Patient Instructions (Addendum)
Medication Instructions:  Your physician has recommended you make the following change in your medication:  1) STOP Lisinopril 2) START Valsartan 80mg  daily. An Rx has been sent to your pharmacy   Labwork: We will request copies of your recent  Labs from Dr.Wong's office  Testing/Procedures: None ordered  Follow-Up: Your physician wants you to follow-up in: 6 months with Dr.Nelson You will receive a reminder letter in the mail two months in advance. If you don't receive a letter, please call our office to schedule the follow-up appointment.   Any Other Special Instructions Will Be Listed Below (If Applicable).     If you need a refill on your cardiac medications before your next appointment, please call your pharmacy.

## 2015-08-28 NOTE — Addendum Note (Signed)
Addended by: Lamar Laundry on: 08/28/2015 08:58 AM   Modules accepted: Orders

## 2015-08-28 NOTE — Progress Notes (Signed)
Patient ID: Reginald Rios, male   DOB: June 09, 1955, 60 y.o.   MRN: [id number]      Cardiology Office Note   Date:  08/28/2015   ID:  Reginald Rios, DOB 1955-11-20, MRN [id number]  PCP:  Reginald Harada, MD  Cardiologist:  Reginald Spark, MD   Chief complain: DOE, fatigue  History of Present Illness: Reginald Rios is a very pleasant 60 y.o. male , whose wife is a psychiatrist and comes in for new DOE and exertional fatigue. The patient is very active playing tennis 6x/week, hiking, still working. He has no prior cardiac history, both of his parents died of CVA in 81'.  He has h/o DM x 7 years.  He has noticed that lately he gets progressively more tired with exertion and on his most recent hike he was not able to keep up with his wife. He denies and CP, palpitations or syncope. No claudications.  He is followed by PCP, no lipid management in the past.   05/15/2015 - this is 2 months follow-up, the patient underwent stress testing that showed no ischemia but decreased ejection fraction that was confirmed on the echocardiogram with LVEF 45-50%. He was prescribed pravastatin and losartan. Today he states that he feels like he has even less energy and some days he just doesn't seem like doing anything. His wife believes he is being depressed. He doesn't remember being depressed before. He denies any chest pain or shortness of breath. He continues to play tennis but only 3 times a week not 7 times a week. He uses Ritalin multiple times a day for energy especially before playing tennis. He denies any orthopnea, paroxysmal nocturnal dyspnea or lower extremity edema.  08/28/2015 - 3 months follow-up, patient denies any chest pain shortness of breath lower extremity edema orthopnea or proximal nocturnal dyspnea. However he continues feeling tired. 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 set.  We have repeated his echocardiogram in March 2017 and his TTE and LVEF has improved now low-normal estimated at 50-55%. He is complaining of chronic nagging cough that is nonproductive for the last 2 months. He has stopped taking pravastatin as he believed was making him depressed.  Past Medical History  Diagnosis Date  . Diabetes mellitus without complication ([place])   . Exercise-induced asthma   . Arthritis   . Osteoarthritis   . Hypogonadism in male   . Low back pain   . ADD (attention deficit disorder)   . Anemia   . Heart murmur   . Hyperlipidemia     family hx of high cholesterol    Past Surgical History  Procedure Laterality Date  . Knee arthroscopy    . Tenotomy achilles tendon       Current Outpatient Prescriptions  Medication Sig Dispense Refill  . gabapentin (NEURONTIN) 100 MG capsule Take 100 mg by mouth at bedtime.  1  . JANUMET 50-1000 MG per tablet TAKE 1 TABLET BY MOUTH TWICE DAILY WITH A MEAL 180 tablet 1  . lisinopril (PRINIVIL,ZESTRIL) 2.5 MG tablet Take 1 tablet (2.5 mg total) by mouth daily. 90 tablet 3  . methylphenidate (RITALIN) 20 MG tablet Take 20 mg by mouth daily.    . montelukast (SINGULAIR) 10 MG tablet Take 1 tablet (10 mg total) by mouth daily. 90 tablet 3  . nateglinide (STARLIX) 120 MG tablet Take 1 tablet (120 mg total)  by mouth daily. 90 tablet 1  . tadalafil (CIALIS) 10 MG tablet Take 1 tablet (10 mg total) by mouth daily as needed for erectile dysfunction. 10 tablet 3  . testosterone (ANDROGEL) 50 MG/5GM (1%) GEL APPLY 5 GRAMS (1 TUBE) TO EACH UPPER ARM AREA EVERY MORNING 150 g 3  . VENTOLIN HFA 108 (90 BASE) MCG/ACT inhaler Inhale 1 puff into the lungs every 6 (six) hours as needed (cough).   1   No current facility-administered medications for this visit.    Allergies:   Invokana and Statins    Social History:  The patient  reports that he has never smoked. He has never used smokeless tobacco. He reports that he drinks alcohol. He  reports that he does not use illicit drugs.   Family History:  The patient's family history includes Coronary artery disease (age of onset: 81) in his father; Hypothyroidism in his brother; Lung cancer (age of onset: 37) in his mother. There is no history of Colon cancer or Prostate cancer.   ROS:  Please see the history of present illness.   Otherwise, review of systems are positive for none.   All other systems are reviewed and negative.   PHYSICAL EXAM: VS:  There were no vitals taken for this visit. , BMI There is no weight on file to calculate BMI. GEN: Well nourished, well developed, in no acute distress HEENT: normal Neck: no JVD, carotid bruits, or masses Cardiac: RRR; no murmurs, rubs, or gallops,no edema  Respiratory:  clear to auscultation bilaterally, normal work of breathing GI: soft, nontender, nondistended, + BS MS: no deformity or atrophy Skin: warm and dry, no rash Neuro:  Strength and sensation are intact Psych: euthymic mood, full affect  EKG: SR, LVH  Recent Labs: 12/19/2014: Hemoglobin 13.4; Platelets 248.0; TSH 2.34 06/03/2015: BUN 13; Creat 0.69*; Potassium 4.2; Sodium 137   Lipid Panel    Component Value Date/Time   CHOL 159 12/22/2013 1509   TRIG 62.0 12/22/2013 1509   HDL 51.80 12/22/2013 1509   CHOLHDL 3 12/22/2013 1509   VLDL 12.4 12/22/2013 1509   LDLCALC 95 12/22/2013 1509    Wt Readings from Last 3 Encounters:  05/15/15 193 lb 12.8 oz (87.907 kg)  03/27/15 187 lb (84.823 kg)  03/15/15 187 lb (84.823 kg)    Other studies Reviewed: Records from PCP  Nuclear stress test:  Nuclear stress EF: 52%.  There was no ST segment deviation noted during stress.  The left ventricular ejection fraction is mildly decreased (45-54%).  This is a low risk study.  No ischemia Thinning of the mid and basal inferior wall with normal motion consistent with diaphragmatic attenuation EF 52%   TTE: 07/03/2015 - Left ventricle: The cavity size was normal.  Systolic function was  normal. The estimated ejection fraction was in the range of 50%  to 55%. Wall motion was normal; there were no regional wall  motion abnormalities. Doppler parameters are consistent with  abnormal left ventricular relaxation (grade 1 diastolic  dysfunction). There was no evidence of elevated ventricular  filling pressure by Doppler parameters. - Aortic valve: Trileaflet; normal thickness leaflets. There was no  regurgitation. - Mitral valve: Mildly thickened leaflets . There was trivial  regurgitation. - Right ventricle: The cavity size was normal. Wall thickness was  normal. Systolic function was normal. - Right atrium: The atrium was normal in size. - Tricuspid valve: There was no regurgitation. - Pulmonary arteries: Systolic pressure was within the normal  range. -  Inferior vena cava: The vessel was normal in size. - Pericardium, extracardiac: There was no pericardial effusion.  EKG done on 08/28/2015 shows normal sinus rhythm with ventricular rate of 63 bpm normal EKG that unchanged from prior.   ASSESSMENT AND PLAN:  1. DOE, exertional fatigue - normal exercise nuclear stress test with no evidence of infarct or ischemia. However decreased in LVEF. This was confirmed on echocardiogram with diffuse hypokinesis with LVEF 45-50%, this has improved to 50-55%.  2. Nonischemic cardiomyopathy - losartan was discontinued as patient believed that it caused depression, lisinopril is possibly behind his chronic cough, we'll discontinue and start valsartan 80 mg daily.  His LVEF has improved and is now low normal.  3. Hyperlipidemia - last year LDL 95, goal for a diabetic < 70, also based on the most recent guidelines any diabetic patient > 45 years needs to be on moderate or high dose of highly potent statin. As he was intolerant to lipitor and crestor he was started on pravastatin 10 mg po daily, however there is concern about possible depression as a side  effect we will hold and see if that makes any difference. Patient is willing to try statin again, we will obtain labs from his primary care physician Dr. Jacelyn Grip and if LDL more than 70 we will start pravastatin 10 mg again.  4. Depression -we have tried changing several medication with no significant improvement. Doubt that cardiac medicines are causing his depression.  Follow up in 6 months.   Signed, Reginald Spark, MD  08/28/2015 8:22 AM    [facility] [address], [place], [place]  [address] Phone: [phone]; Fax: [phone]

## 2015-08-29 ENCOUNTER — Telehealth: Payer: Self-pay

## 2015-08-29 DIAGNOSIS — E785 Hyperlipidemia, unspecified: Secondary | ICD-10-CM

## 2015-08-29 MED ORDER — PRAVASTATIN SODIUM 10 MG PO TABS: 10.0000 mg | ORAL_TABLET | Freq: Every evening | ORAL | Status: DC

## 2015-08-29 NOTE — Telephone Encounter (Signed)
Pt aware that Dr.Nelson has reviewed his lab results ordered by his pcp. Her recommendations are as follows: Based on the provided lab results from Dr. Jacelyn Grip I would start pravastatin 10 mg daily for LDL of 99 with goal less than 70.  Pt is agreeable, Rx sent to pt pharmacy Fasting lipid and lft appt scheduled for 5/15. Pt agreeable with plan and verbalized understanding.

## 2015-09-06 DIAGNOSIS — M25611 Stiffness of right shoulder, not elsewhere classified: Secondary | ICD-10-CM | POA: Diagnosis not present

## 2015-09-09 MED FILL — VALSARTAN 80 MG TABLET: 80 | 30 days supply | Qty: 30 | Fill #0 | Status: TO

## 2015-09-09 MED FILL — PRAVASTATIN NA 10 MG TAB: 10 | 90 days supply | Qty: 90 | Fill #0 | Status: TO

## 2015-09-10 ENCOUNTER — Ambulatory Visit (INDEPENDENT_AMBULATORY_CARE_PROVIDER_SITE_OTHER): Payer: 59 | Admitting: Family Medicine

## 2015-09-10 ENCOUNTER — Encounter: Payer: Self-pay | Admitting: Family Medicine

## 2015-09-10 ENCOUNTER — Other Ambulatory Visit: Payer: Self-pay

## 2015-09-10 VITALS — BP 120/70 | HR 90 | Ht 70.0 in | Wt 178.0 lb

## 2015-09-10 DIAGNOSIS — M75110 Incomplete rotator cuff tear or rupture of unspecified shoulder, not specified as traumatic: Secondary | ICD-10-CM | POA: Insufficient documentation

## 2015-09-10 DIAGNOSIS — M25511 Pain in right shoulder: Secondary | ICD-10-CM

## 2015-09-10 DIAGNOSIS — M75111 Incomplete rotator cuff tear or rupture of right shoulder, not specified as traumatic: Secondary | ICD-10-CM | POA: Diagnosis not present

## 2015-09-10 HISTORY — DX: Incomplete rotator cuff tear or rupture of unspecified shoulder, not specified as traumatic: M75.110

## 2015-09-10 MED ORDER — MELOXICAM 15 MG PO TABS: 15.0000 mg | ORAL_TABLET | Freq: Every day | ORAL | Status: DC

## 2015-09-10 MED FILL — MELOXICAM 15 MG TABLET: 15 | 30 days supply | Qty: 30 | Fill #0

## 2015-09-10 MED FILL — TESTOSTERONE 50 MG/5 GRAM G: 50 MG/5GM | 30 days supply | Qty: 150 | Fill #0

## 2015-09-10 NOTE — Patient Instructions (Addendum)
Good to see you  You do have a new small tear of the rotator cuff.  Ice 20 minutes 2 times daily. Usually after activity and before bed. Stay active but no overhead activity .  Meloxicam daily for 10 days then as needed Physical therapy will be calling you  See me again in 3 weeks.

## 2015-09-10 NOTE — Progress Notes (Signed)
[facility] [place], [place] [address] Phone: [phone] Subjective:    CC: right shoulder pain  back pain follow up  [id number] Reginald Rios is a 60 y.o. male coming in with complaint of right shoulderto pain and back pain. Right shoulder-patient was seen previously and was diagnosed with more subacromial bursitis. 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 Patient was given an injection greater than 8 months ago. Had been doing very well. Patient was encouraged to continue to do the home exercises. Patient states Recently he started having increasing pain with playing more tennis. Patient states while playing he doesn't have the pain in the next day he has severe pain. Patient states that for 24-48 hours he is unable to actually lift his arm. Patient states that he did get better after approximate one week but then we went to play again at the same consistent problem. Reaching behind his back or reaching out can cause a severe sharp 8 out of 10 pain. Would state that there is weakness compared to contralateral side. Does not remember one instance that seemed to be the injury.   Back seems to be doing well.      Past Medical History  Diagnosis Date  . Diabetes mellitus without complication ([place])   . Exercise-induced asthma   . Arthritis   . Osteoarthritis   . Hypogonadism in male   . Low back pain   . ADD (attention deficit disorder)   . Anemia   . Heart murmur   . Hyperlipidemia     family hx of high cholesterol   Past Surgical History  Procedure Laterality Date  . Knee arthroscopy    . Tenotomy achilles tendon     Social History  Substance Use Topics  . Smoking status: Never Smoker   . Smokeless tobacco: Never Used  . Alcohol Use: 0.0 oz/week    0 Standard drinks or equivalent per week     Comment: rare   Allergies  Allergen Reactions  . Invokana [Canagliflozin] Rash  . Statins Other (See Comments)    Muscle aches      Past medical history, social, surgical and family history all reviewed in electronic medical record.   Review of Systems: No headache, visual changes, nausea, vomiting, diarrhea, constipation, dizziness, abdominal pain, skin rash, fevers, chills, night sweats, weight loss, swollen lymph nodes, body aches, joint swelling, muscle aches, chest pain, shortness of breath, mood changes.   Objective Blood pressure 120/70, pulse 90, height 5\' 10"  (1.778 m), weight 178 lb (80.74 kg), SpO2 98 %.  General: No apparent distress alert and oriented x3 mood and affect normal, dressed appropriately.  HEENT: Pupils equal, extraocular movements intact  Respiratory: Patient's speak in full sentences and does not appear short of breath  Cardiovascular: No lower extremity edema, non tender, no erythema  Skin: Warm dry intact with no signs of infection or rash on extremities or on axial skeleton.  Abdomen: Soft nontender  Neuro: Cranial nerves II through XII are intact, neurovascularly intact in all extremities with 2+ DTRs and 2+ pulses.  Lymph: No lymphadenopathy of posterior or anterior cervical chain or axillae bilaterally.  Gait normal with good balance and coordination.  MSK:  Non tender with full range of motion and good stability and symmetric strength and tone of elbows, wrist,  knee and ankles bilaterally. Patient does have atrophy of the hand musculature on the right side Neck: Inspection unremarkable. No palpable stepoffs. Negative Spurling's  maneuver. Full neck range of motion Grip strength and sensation normal in bilateral hands Strength good C4 to T1 distribution No sensory change to C4 to T1 Negative Hoffman sign bilaterally Reflexes normal   Shoulder: Right Inspection reveals no abnormalities, atrophy or asymmetry. Pain seems to be more on the area of the latissimus lateral to the shoulder mostly the posterior aspect of the axilla ROM is full in all planes passively. 4-5 strength  compared to the contralateral side signs of impingement with positive Neer and Hawkin's tests, but negative empty can sign. Speeds and Yergason's tests normal. No labral pathology noted with negative Obrien's, negative clunk and good stability. Normal scapular function observed. No painful arc and no drop arm sign. No apprehension sign [place] lateral shoulder unremarkable  MSK US performed of: Right This study was ordered, performed, and interpreted by Charlann Boxer D.O.  Shoulder:   Supraspinatus: Appears normal on long and transverse views. Positive bursa  Infraspinatus:  Appears normal on long and transverse views. Significant increase in Doppler flow Subscapularis:  Patient does have an acute tear of approximately 30% of the articular side of the subscapularis with no significant retraction  Teres Minor:  Appears normal on long and transverse views. AC joint: Moderate to severe arthritis with positive geyser. Glenohumeral Joint:  Appears normal without effusion. Glenoid Labrum:  Degenerative tears noted Biceps Tendon:  Appears normal on long and transverse views, no fraying of tendon, tendon located in intertubercular groove, no subluxation with shoulder internal or external rotation.   Impression: Rotator cuff tear which is new  Procedure: Real-time Ultrasound Guided Injection of right glenohumeral joint Device: GE Logiq E  Ultrasound guided injection is preferred based studies that show increased duration, increased effect, greater accuracy, decreased procedural pain, increased response rate with ultrasound guided versus blind injection.  Verbal informed consent obtained.  Time-out conducted.  Noted no overlying erythema, induration, or other signs of local infection.  Skin prepped in a sterile fashion.  Local anesthesia: Topical Ethyl chloride.  With sterile technique and under real time ultrasound guidance:  Joint visualized.  23g 1  inch needle inserted posterior approach.  Pictures taken for needle placement. Patient did have injection of 2 cc of 1% lidocaine, 2 cc of 0.5% Marcaine, and 1.0 cc of Kenalog 40 mg/dL. Completed without difficulty  Pain immediately resolved suggesting accurate placement of the medication.  Advised to call if fevers/chills, erythema, induration, drainage, or persistent bleeding.  Images permanently stored and available for review in the ultrasound unit.  Impression: Technically successful ultrasound guided injection.       Impression and Recommendations:     This case required medical decision making of moderate complexity.

## 2015-09-10 NOTE — Assessment & Plan Note (Addendum)
Patient given injection today and tolerated the procedure well. We discussed icing regimen and home exercises. We discussed which activities to do in which ones to potentially avoid patient was sent to formal physical therapy.. Patient will make these changes and come back and see me again in 3-4 weeks. Short course of anti-inflammatories given. Not having any significant improvement or any worsening weakness advance imaging would be warranted.

## 2015-09-10 NOTE — Progress Notes (Signed)
Pre visit review using our clinic review tool, if applicable. No additional management support is needed unless otherwise documented below in the visit note. 

## 2015-09-12 ENCOUNTER — Ambulatory Visit: Payer: 59 | Admitting: Family Medicine

## 2015-09-16 ENCOUNTER — Other Ambulatory Visit (INDEPENDENT_AMBULATORY_CARE_PROVIDER_SITE_OTHER): Payer: 59 | Admitting: *Deleted

## 2015-09-16 DIAGNOSIS — E785 Hyperlipidemia, unspecified: Secondary | ICD-10-CM | POA: Diagnosis not present

## 2015-09-16 LAB — LIPID PANEL
Cholesterol: 126 mg/dL (ref 125–200)
HDL: 47 mg/dL (ref >=40)
LDL Cholesterol: 68 mg/dL (ref <130)
Total CHOL/HDL Ratio: 2.7 Ratio (ref <=5.0)
Triglycerides: 56 mg/dL (ref <150)
VLDL: 11 mg/dL (ref <30)

## 2015-09-16 LAB — HEPATIC FUNCTION PANEL
ALT: 14 U/L (ref 9–46)
AST: 13 U/L (ref 10–35)
Albumin: 4.1 g/dL (ref 3.6–5.1)
Alkaline Phosphatase: 57 U/L (ref 40–115)
Bilirubin, Direct: 0.1 mg/dL (ref <=0.2)
Indirect Bilirubin: 0.3 mg/dL (ref 0.2–1.2)
Total Bilirubin: 0.4 mg/dL (ref 0.2–1.2)
Total Protein: 6.5 g/dL (ref 6.1–8.1)

## 2015-09-23 ENCOUNTER — Encounter: Payer: Self-pay | Admitting: Physical Therapy

## 2015-09-23 ENCOUNTER — Ambulatory Visit: Payer: 59 | Attending: Family Medicine | Admitting: Physical Therapy

## 2015-09-23 DIAGNOSIS — M25511 Pain in right shoulder: Secondary | ICD-10-CM | POA: Insufficient documentation

## 2015-09-23 DIAGNOSIS — M25611 Stiffness of right shoulder, not elsewhere classified: Secondary | ICD-10-CM | POA: Insufficient documentation

## 2015-09-23 NOTE — Therapy (Signed)
Reginald Rios [address]  [place], [place], [address] Phone: [phone]   Fax:  [phone]  Physical Therapy Evaluation  Patient Details  Name: Reginald Rios MRN: [id number] Date of Birth: 1956/01/18 Referring Provider: Hulan Saas  Encounter Date: 09/23/2015      PT End of Session - 09/23/15 0851    Visit Number 1   Number of Visits 6   Date for PT Re-Evaluation 11/04/15   PT Start Time 0849   PT Stop Time 0935   PT Time Calculation (min) 46 min      Past Medical History  Diagnosis Date  . Diabetes mellitus without complication ([place])   . Exercise-induced asthma   . Arthritis   . Osteoarthritis   . Hypogonadism in male   . Low back pain   . ADD (attention deficit disorder)   . Anemia   . Heart murmur   . Hyperlipidemia     family hx of high cholesterol    Past Surgical History  Procedure Laterality Date  . Knee arthroscopy    . Tenotomy achilles tendon      There were no vitals filed for this visit.       Subjective Assessment - 09/23/15 0852    Subjective pt with one month history of Right Shoulder pain.  States noted onset of pain in AM day following playing tennis.  States woke up in intense pain and noted swelling to R shoulder. Went to MD and received injection on 09/10/15 (he had received similar injection with benefit approx 8 months prior). States injection helped but then played tennis again approx 10 days later and noted return of pain.  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   Patient Stated Goals reduce shoulder pain.   Currently in Pain? 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   Leisure enjoys playing tennis but hasn't been able lately; yardwork; plays drums   Observation/Other Assessments   Focus on Therapeutic Outcomes (FOTO)  50% limitation   ROM / Strength   AROM / PROM / Strength AROM;PROM;Strength   AROM   AROM Assessment Site Shoulder   Right/Left Shoulder Right   Right Shoulder Flexion 140 Degrees   Right Shoulder ABduction 125 Degrees   Right Shoulder Internal Rotation 45 Degrees  Reach to T9 (L to T5)   Right Shoulder External Rotation 85 Degrees  Reach to C6 (L to T4)   PROM   PROM Assessment Site Shoulder   Right/Left Shoulder Right   Right Shoulder Flexion 145 Degrees  160 on L   Right Shoulder Internal Rotation 68 Degrees  85 on L   Right Shoulder External Rotation 105 Degrees   Strength   Strength Assessment Site Shoulder   Right/Left Shoulder Right   Right Shoulder  Flexion 4+/5   Right Shoulder Extension 4+/5   Right Shoulder ABduction 4/5   Right Shoulder Internal Rotation 4/5   Right Shoulder External Rotation 3+/5  pain         TODAY'S TREATMENT TherEx - R Shoulder ER Red TB R Shoulder IR Red TB B Shoulder extension with scap retraction Red TB Standing mid row Red TB Doorway chest stretch Also instructed in kneeling lat stretch but did not perform today (pt familiar with this stretch)          PT Education - 09/23/15 0932    Education provided Yes   Education Details initial HEP   Person(s) Educated Patient   Methods Explanation;Demonstration;Handout   Comprehension Verbalized understanding;Returned demonstration             PT Long Term Goals - 09/23/15 1124    PT LONG TERM GOAL #1   Title Right Shoulder AROM equal to Left  by 11/04/15   Status New   PT LONG TERM GOAL #2   Title R Shoulder MMT 4+/5 all planes without c/o pain by 11/04/15   Status New   PT LONG TERM GOAL #3   Title pt able to perform overhead serve in tennis at modified intensity without limitation by shoulder pain by 11/04/15   Status New               Plan - 09/23/15 1113    Clinical Impression Statement [name] with c/o R Shoulder pain.  States has had pain on/off for several months. Received injection approx 8 months ago with good benefit but pain returned early May (around same time he took on new job as tennis pro). He received another injection on 09/10/15 and this too with good benefit but pain still present.  He states pain has been 3-4/10 lately and notes pain with overhead reaching activities especially.  Assessment reveals R shoulder weakness compared to L (pain only noted with ER MMT) along with LOM vs L with AROM and PROM.  Right Shoulder Flexion PROM limited to 145 without c/o pain, "just tightness".  Pulling sensation noted into R pectorals and posterolateral scapular mms (teres vs lats) and these muscles seem reason for restricted shoulder ROM.  PT POC will focus on soft tissue pliability while also working to improve stability of [place] through RC strengthening in order to decrease shoulder impingment with overhead activities.  Pt states he can only come to PT on Mondays, so POC will be 1x/wk for 6wks with emphasis on progressing HEP as tolerated.  He was instructed in initial HEP today and advised to avoid overhead activities as much as possible for now.   Rehab Potential Good   PT Frequency 1x / week   PT Duration 6 weeks   PT Treatment/Interventions Manual techniques;Therapeutic exercise;Moist Heat;Therapeutic activities;Science writer;Patient/family education;Ultrasound;Cryotherapy   PT Next Visit Plan Stretching to R pecs and lats; RC strengthening to tolerance progressing to [facility] as able;  Scapular retraction training.   Consulted and Agree with Plan of Care Patient      Patient will benefit from skilled therapeutic intervention in order to improve the following deficits and impairments:  Pain, Decreased strength, Decreased mobility, Decreased range of motion, Postural dysfunction, Impaired flexibility  Visit Diagnosis: Pain in right shoulder  Stiffness of right shoulder, not elsewhere classified     Problem List Patient Active Problem List   Diagnosis Date Noted  . Incomplete rotator cuff tear 09/10/2015  . Nonischemic cardiomyopathy ([place]) 05/15/2015  .  Fatigue due to depression 05/15/2015  . Dyspnea on exertion 05/15/2015  . Decreased cardiac ejection fraction 04/02/2015  . Strain of latissimus dorsi muscle 01/21/2015  . Shoulder bursitis 10/12/2014  . Stenosis of lateral recess of lumbar spine 10/12/2014  . BPPV (benign paroxysmal positional vertigo) 08/14/2014  . Hyperlipidemia 01/19/2014  . Anemia, unspecified 01/19/2014  . ADD (attention deficit disorder) 01/19/2014  . Diabetes type 2, uncontrolled ([place]) 12/22/2013  . Hypogonadism in male 12/22/2013  . Arthritis of hand, left 12/22/2013    [facility] PT, [facility] 09/23/2015, 11:27 AM  [facility] [address]  [place], [place], [address] Phone: [phone]   Fax:  [phone]  Name: Reginald Rios MRN: [id number] Date of Birth: 24-Oct-1955

## 2015-10-04 MED FILL — JANUMET 50-1,000 MG TABLET: 50-1000 | 90 days supply | Qty: 180 | Fill #0

## 2015-10-07 ENCOUNTER — Ambulatory Visit: Payer: 59

## 2015-10-07 ENCOUNTER — Ambulatory Visit (INDEPENDENT_AMBULATORY_CARE_PROVIDER_SITE_OTHER)
Admission: RE | Admit: 2015-10-07 | Discharge: 2015-10-07 | Disposition: A | Discharge status: Home / Self Care | Payer: 59 | Source: Ambulatory Visit | Attending: Family Medicine | Admitting: Family Medicine

## 2015-10-07 ENCOUNTER — Ambulatory Visit (INDEPENDENT_AMBULATORY_CARE_PROVIDER_SITE_OTHER): Payer: 59 | Admitting: Family Medicine

## 2015-10-07 ENCOUNTER — Encounter: Payer: Self-pay | Admitting: Family Medicine

## 2015-10-07 VITALS — BP 112/78 | HR 67 | Wt 178.0 lb

## 2015-10-07 DIAGNOSIS — M5010 Cervical disc disorder with radiculopathy, unspecified cervical region: Secondary | ICD-10-CM | POA: Diagnosis not present

## 2015-10-07 DIAGNOSIS — M47812 Spondylosis without myelopathy or radiculopathy, cervical region: Secondary | ICD-10-CM | POA: Diagnosis not present

## 2015-10-07 DIAGNOSIS — M75111 Incomplete rotator cuff tear or rupture of right shoulder, not specified as traumatic: Secondary | ICD-10-CM

## 2015-10-07 DIAGNOSIS — M542 Cervicalgia: Secondary | ICD-10-CM

## 2015-10-07 HISTORY — DX: Cervical disc disorder with radiculopathy, unspecified cervical region: M50.10

## 2015-10-07 MED ORDER — PREDNISONE 50 MG PO TABS: 50.0000 mg | ORAL_TABLET | Freq: Every day | ORAL | Status: DC

## 2015-10-07 MED ORDER — GABAPENTIN 100 MG PO CAPS: 100.0000 mg | ORAL_CAPSULE | Freq: Every day | ORAL | Status: DC

## 2015-10-07 MED FILL — predniSONE 50 MG TABS: 50 | 5 days supply | Qty: 5 | Fill #0

## 2015-10-07 MED FILL — METHYLPHENIDATE 20 MG TAB: 20 | 30 days supply | Qty: 60 | Fill #0

## 2015-10-07 MED FILL — GABAPENTIN 100 MG CAPSULE: 100 | 90 days supply | Qty: 90 | Fill #0 | Status: TO

## 2015-10-07 NOTE — Patient Instructions (Signed)
Good to see you  Ice is your friend Prednisone daily for 5 days Continue the exercises on your shoulder 2 times a week  For the neck try to keep head in neutral position  No lifting more than 50 pounds  Try to keep hands within peripheral vision as much as possible See me again in 2-3 weeks if neck is not perfect

## 2015-10-07 NOTE — Assessment & Plan Note (Signed)
Patient shoulder seems to be doing significantly better. Continue to do the home exercises 2-3 times a week. Patient has good strength. I believe that this will heal appropriately.

## 2015-10-07 NOTE — Progress Notes (Signed)
Pre visit review using our clinic review tool, if applicable. No additional management support is needed unless otherwise documented below in the visit note. 

## 2015-10-07 NOTE — Progress Notes (Signed)
[name] [place], [place] [address] Phone: [phone] Subjective:    CC: right shoulder pain  back pain follow up  [id number] Reginald Rios is a 60 y.o. male coming in with complaint of right shoulder pain and back pain. Right shoulder-found to have a rotator cuff tear. .Patient was given an injection. States that his shoulder has been completely pain-free for multiple weeks now. States that it has not given him as much pain. States that he is able to do all activities. Back seems to be doing well. Patient feels that his neck seems to be doing worse. States that he is having limited range of motion. Patient states that it feels this is radiating towards the scapula. Next a worse when he tries to do heavy lifting. Unable to turn his head . Has had this in the past but seems to be worsening. Feels that some of his shoulder pain is more secondary to the neck pain. 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 Diabetes mellitus without complication ([place])   . Exercise-induced asthma   . Arthritis   . Osteoarthritis   . Hypogonadism in male   . Low back pain   . ADD (attention deficit disorder)   . Anemia   . Heart murmur   . Hyperlipidemia     family hx of high cholesterol   Past Surgical History  Procedure Laterality Date  . Knee arthroscopy    . Tenotomy achilles tendon     Social History  Substance Use Topics  . Smoking status: Never Smoker   . Smokeless tobacco: Never Used  . Alcohol Use: 0.0 oz/week    0 Standard drinks or equivalent per week     Comment: rare   Allergies  Allergen Reactions  . Invokana [Canagliflozin] Rash  . Statins Other (See Comments)    Muscle aches     Past medical history, social, surgical and family history all reviewed in electronic medical record.   Review of Systems: No  headache, visual changes, nausea, vomiting, diarrhea, constipation, dizziness, abdominal pain, skin rash, fevers, chills, night sweats, weight loss, swollen lymph nodes, body aches, joint swelling, muscle aches, chest pain, shortness of breath, mood changes.   Objective Blood pressure 112/78, pulse 67, weight 178 lb (80.74 kg), SpO2 99 %.  General: No apparent distress alert and oriented x3 mood and affect normal, dressed appropriately.  HEENT: Pupils equal, extraocular movements intact  Respiratory: Patient's speak in full sentences and does not appear short of breath  Cardiovascular: No lower extremity edema, non tender, no erythema  Skin: Warm dry intact with no signs of infection or rash on extremities or on axial skeleton.  Abdomen: Soft nontender  Neuro: Cranial nerves II through XII are intact, neurovascularly intact in all extremities with 2+ DTRs and 2+ pulses.  Lymph: No lymphadenopathy of posterior or anterior cervical chain or axillae bilaterally.  Gait normal with good balance and coordination.  MSK:  Non tender with full range of motion and good stability and symmetric strength and tone of elbows, wrist,  knee and ankles bilaterally. Patient does have atrophy of the hand musculature on the right side Neck: Inspection unremarkable. No palpable stepoffs. Positive Spurling's maneuver. Decreased range of motion lacking the last 15 of extension as well as only 5 of side bending bilaterally.  Patient does have some radicular symptoms down the right side Grip strength and sensation normal in bilateral hands Strength good C4 to T1 distribution No sensory change to C4 to T1 Negative Hoffman sign bilaterally Reflexes normal Significant worsening from previous exam  Shoulder: Right Inspection reveals no abnormalities, atrophy or asymmetry. Pain seems to be more on the area of the latissimus lateral to the shoulder mostly the posterior aspect of the axilla ROM is full in all planes  passively. 4+/5 strength compared to the contralateral side signs of impingement with positive Neer and Hawkin's tests, but negative empty can sign. Speeds and Yergason's tests normal. No labral pathology noted with negative Obrien's, negative clunk and good stability. Normal scapular function observed. No painful arc and no drop arm sign. No apprehension sign Contralateral shoulder unremarkable improvement from previous exam        Impression and Recommendations:     This case required medical decision making of moderate complexity.

## 2015-10-07 NOTE — Assessment & Plan Note (Signed)
With patient making significant improvement after the injection of the shoulder the patient continuing to have the subjective pain I am concern for may be a cervical radiculopathy that be playing a role. X-rays ordered today to further evaluate the amount of arthritis that is in patient's neck. Patient states that he has had workup previously many years ago when he had bulging disks. Patient continues to have radicular symptoms advance imaging would be warranted especially if there is any numbness or weakness.

## 2015-10-14 ENCOUNTER — Ambulatory Visit: Payer: 59

## 2015-10-17 ENCOUNTER — Ambulatory Visit: Payer: 59 | Attending: Family Medicine | Admitting: Physical Therapy

## 2015-10-17 DIAGNOSIS — M25511 Pain in right shoulder: Secondary | ICD-10-CM | POA: Diagnosis not present

## 2015-10-17 DIAGNOSIS — M25611 Stiffness of right shoulder, not elsewhere classified: Secondary | ICD-10-CM | POA: Insufficient documentation

## 2015-10-17 MED FILL — VALSARTAN 80 MG TABLET: 80 | 30 days supply | Qty: 30 | Fill #0

## 2015-10-17 NOTE — Therapy (Addendum)
[facility] [address]  [place], [place], [address] Phone: [phone]   Fax:  [phone]  Physical Therapy Treatment  Patient Details  Name: Reginald Rios MRN: [id number] Date of Birth: 1956/04/06 Referring Provider: Hulan Saas  Encounter Date: 10/17/2015      PT End of Session - 10/17/15 1353    Visit Number 2   Number of Visits 6   Date for PT Re-Evaluation 11/04/15   PT Start Time 1302   PT Stop Time 1344   PT Time Calculation (min) 42 min   Activity Tolerance Patient tolerated treatment well   Behavior During Therapy [facility] for tasks assessed/performed      Past Medical History  Diagnosis Date  . Diabetes mellitus without complication ([place])   . Exercise-induced asthma   . Arthritis   . Osteoarthritis   . Hypogonadism in male   . Low back pain   . ADD (attention deficit disorder)   . Anemia   . Heart murmur   . Hyperlipidemia     family hx of high cholesterol    Past Surgical History  Procedure Laterality Date  . Knee arthroscopy    . Tenotomy achilles tendon      There were no vitals filed for this visit.      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  lifted 300# golf cart yesterday and felt a little pain with lifting.  today he's sore   Patient Stated Goals reduce shoulder pain.   Currently in Pain? Yes   Pain Score 5    Pain Location Shoulder   Pain Orientation Right   Pain Descriptors / Indicators Aching   Pain Onset More than a month ago   Pain Frequency Intermittent   Aggravating Factors  reaching overhead   Pain Relieving Factors meds, ice                         [facility] Adult PT Treatment/Exercise - 10/17/15 1306    Exercises   Exercises Shoulder   Shoulder Exercises: Supine   Protraction Right;20 reps;Weights   Protraction Weight (lbs) 5    Shoulder Exercises: Prone   Flexion Right;20 reps;Weights   Flexion Weight (lbs) 1   Extension Right;20 reps;Weights   Extension Weight (lbs) 2   External Rotation Right;20 reps;Weights   External Rotation Weight (lbs) 2   External Rotation Limitations in 90 degrees abduction   Horizontal ABduction 1 Right;20 reps;Weights   Horizontal ABduction 1 Weight (lbs) 2   Shoulder Exercises: Standing   External Rotation Right;20 reps;Theraband   Theraband Level (Shoulder External Rotation) Level 3 (Green)   Internal Rotation Right;20 reps;Theraband   Theraband Level (Shoulder Internal Rotation) Level 3 (Green)   Extension Right;20 reps;Theraband   Theraband Level (Shoulder Extension) Level 3 (Green)   Shoulder Exercises: ROM/Strengthening   UBE (Upper Arm Bike) L 2.5 x 6 min (3' fwd / 3' bwd)   Shoulder Exercises: Stretch   Corner Stretch 2 reps;30 seconds   Other Shoulder Stretches childs pose 3x30 sec at midline and to L                PT Education - 10/17/15 1352    Education provided Yes   Education Details scap stab exercises   Person(s) Educated Patient   Methods Explanation;Demonstration;Handout   Comprehension  Returned demonstration;Verbalized understanding             PT Long Term Goals - 10/17/15 1356    PT LONG TERM GOAL #1   Title Right Shoulder AROM equal to Left by 11/04/15   Status On-going   PT LONG TERM GOAL #2   Title R Shoulder MMT 4+/5 all planes without c/o pain by 11/04/15   Status On-going   PT LONG TERM GOAL #3   Title pt able to perform overhead serve in tennis at modified intensity without limitation by shoulder pain by 11/04/15   Status Achieved               Plan - 10/17/15 1353    Clinical Impression Statement Pt reports shoulder has been completely pain free since last session but had a flare up after lifting a 300# golf cart yesterday.  Educated on importance of getting assistance with heavy lifting and pt verbalized understanding.   At this time pt requesting to hold PT since shoulder is improved.  Pt c/o neck pain but also reports he is used to weekly massages and he hasn't been getting this since moving to the area.  Recommended pt try to set up massages he is used to then if pain in neck persists to follow up with MD (as recommended by MD) to further direct treatment.  Pt verbalized understanding.   PT Next Visit Plan hold x 30 days; d/c if no return   Consulted and Agree with Plan of Care Patient      Patient will benefit from skilled therapeutic intervention in order to improve the following deficits and impairments:     Visit Diagnosis: Stiffness of right shoulder, not elsewhere classified  Pain in right shoulder     Problem List Patient Active Problem List   Diagnosis Date Noted  . Cervical disc disorder with radiculopathy of cervical region 10/07/2015  . Incomplete rotator cuff tear 09/10/2015  . Nonischemic cardiomyopathy ([place]) 05/15/2015  . Fatigue due to depression 05/15/2015  . Dyspnea on exertion 05/15/2015  . Decreased cardiac ejection fraction 04/02/2015  . Strain of latissimus dorsi muscle 01/21/2015  . Shoulder bursitis 10/12/2014  . Stenosis of lateral recess of lumbar spine 10/12/2014  . BPPV (benign paroxysmal positional vertigo) 08/14/2014  . Hyperlipidemia 01/19/2014  . Anemia, unspecified 01/19/2014  . ADD (attention deficit disorder) 01/19/2014  . Diabetes type 2, uncontrolled ([place]) 12/22/2013  . Hypogonadism in male 12/22/2013  . Arthritis of hand, left 12/22/2013   Laureen Abrahams, PT, DPT 10/17/2015 1:57 PM  [facility] [address]  [place], [place], [address] Phone: [phone]   Fax:  [phone]  Name: Reginald Rios MRN: [id number] Date of Birth: [birth year]         PHYSICAL THERAPY DISCHARGE SUMMARY  Visits from Start of Care: 2  Current functional level related to goals / functional  outcomes: See above   Remaining deficits: Anticipate no difficulty as pt did not return, PT held x 30 days per his request as he was pain free   Education / Equipment: HEP  Plan: Patient agrees to discharge.  Patient goals were partially met. Patient is being discharged due to being pleased with the current functional level.  ?????    Laureen Abrahams, PT, DPT 11/26/15 12:42 PM  [facility] [place], [place] [address]  [phone] (office) [phone] (fax)

## 2015-10-17 NOTE — Patient Instructions (Signed)
Extension - Prone ([place])    Lie with right arm hanging off side of bed. Lift hand back and up. Repeat __20__ times per set. Do __1__ sets per session. Do __6-7__ sessions per week. Use __2__ lb weight.   Copyright  VHI. All rights reserved.    Abduction: Horizontal - Prone ([place])    Lie with right arm hanging down. Lift arm out to side, thumb up. Repeat _20___ times per set. Do __1__ sets per session. Do __6-7__ sessions per week. Use __2__ lb weight.   Copyright  VHI. All rights reserved. Scapular: Flexion (Prone)    Holding _1-2___ pound weights, raise right arm forward. Keep elbow straight. Repeat __20__ times per set. Do __1__ sets per session. Do _1___ sessions per day.  http://orth.exer.us/860   Copyright  VHI. All rights reserved.    External Rotation (Prone)    Lie with right upper arm straight out from body, elbow bent to 90, _2___ pound weight in hand. Rotate forearm up, keeping elbow bent. Return slowly. Repeat _20___ times per set. Do __1__ sets per session. Do __1__ sessions per day.  http://orth.exer.us/940   Copyright  VHI. All rights reserved.    Scapular: Protraction - 90 of Flexion    Holding _5___ pound weights, attempt to push right arm up toward ceiling, keeping elbows straight and back against floor. Repeat __20__ times per set. Do __1__ sets per session. Do _1___ sessions per day.  http://orth.exer.us/856   Copyright  VHI. All rights reserved.

## 2015-10-21 ENCOUNTER — Ambulatory Visit: Payer: 59

## 2015-10-22 MED FILL — TESTOSTERONE 50 MG/5 GRAM G: 50 MG/5GM | 30 days supply | Qty: 150 | Fill #1

## 2015-10-28 ENCOUNTER — Ambulatory Visit: Payer: 59

## 2015-11-16 DIAGNOSIS — L089 Local infection of the skin and subcutaneous tissue, unspecified: Secondary | ICD-10-CM | POA: Diagnosis not present

## 2015-11-16 DIAGNOSIS — S80812A Abrasion, left lower leg, initial encounter: Secondary | ICD-10-CM | POA: Diagnosis not present

## 2015-11-18 MED FILL — VALSARTAN 80 MG TABLET: 80 | 30 days supply | Qty: 30 | Fill #1

## 2015-11-25 DIAGNOSIS — L089 Local infection of the skin and subcutaneous tissue, unspecified: Secondary | ICD-10-CM | POA: Diagnosis not present

## 2015-11-25 DIAGNOSIS — S3093XA Unspecified superficial injury of penis, initial encounter: Secondary | ICD-10-CM | POA: Diagnosis not present

## 2015-12-05 MED FILL — TESTOSTERONE 50 MG/5 GRAM G: 50 MG/5GM | 30 days supply | Qty: 150 | Fill #0

## 2015-12-13 MED FILL — PRAVASTATIN NA 10 MG TAB: 10 | 60 days supply | Qty: 60 | Fill #0 | Status: TO

## 2015-12-13 MED FILL — VALSARTAN 80 MG TABLET: 80 | 30 days supply | Qty: 30 | Fill #2

## 2015-12-30 MED FILL — METHYLPHENIDATE 20 MG TAB: 20 | 30 days supply | Qty: 60 | Fill #0

## 2016-01-10 MED FILL — JANUMET 50-1,000 MG TABLET: 50-1000 | 90 days supply | Qty: 180 | Fill #1

## 2016-01-10 MED FILL — GABAPENTIN 100 MG CAPSULE: 100 | 90 days supply | Qty: 90 | Fill #0

## 2016-01-20 DIAGNOSIS — E291 Testicular hypofunction: Secondary | ICD-10-CM | POA: Diagnosis not present

## 2016-01-20 DIAGNOSIS — E7800 Pure hypercholesterolemia, unspecified: Secondary | ICD-10-CM | POA: Diagnosis not present

## 2016-01-20 DIAGNOSIS — E119 Type 2 diabetes mellitus without complications: Secondary | ICD-10-CM | POA: Diagnosis not present

## 2016-01-20 DIAGNOSIS — Z5181 Encounter for therapeutic drug level monitoring: Secondary | ICD-10-CM | POA: Diagnosis not present

## 2016-01-20 DIAGNOSIS — Z23 Encounter for immunization: Secondary | ICD-10-CM | POA: Diagnosis not present

## 2016-01-20 DIAGNOSIS — E663 Overweight: Secondary | ICD-10-CM | POA: Diagnosis not present

## 2016-01-20 DIAGNOSIS — Z6826 Body mass index (BMI) 26.0-26.9, adult: Secondary | ICD-10-CM | POA: Diagnosis not present

## 2016-01-20 DIAGNOSIS — R202 Paresthesia of skin: Secondary | ICD-10-CM | POA: Diagnosis not present

## 2016-01-20 DIAGNOSIS — N529 Male erectile dysfunction, unspecified: Secondary | ICD-10-CM | POA: Diagnosis not present

## 2016-01-20 MED FILL — VALSARTAN 80 MG TABLET: 80 | 90 days supply | Qty: 90 | Fill #0

## 2016-01-20 MED FILL — TESTOSTERONE 50 MG/5 GRAM G: 50 MG/5GM | 30 days supply | Qty: 150 | Fill #0

## 2016-02-01 ENCOUNTER — Telehealth: Payer: Self-pay | Admitting: Family Medicine

## 2016-02-01 DIAGNOSIS — M25522 Pain in left elbow: Secondary | ICD-10-CM | POA: Diagnosis not present

## 2016-02-01 NOTE — Telephone Encounter (Signed)
Patient Name: Reginald Rios  DOB: 1956-03-14    Initial Comment Caller states has an elbow injury. States icing arm right now and broke out in a sweat. States he played tennis yesterday and now his elbow is in a lot of pain. Taken Advil.    Nurse Assessment  Nurse: Mallie Mussel, RN, Alveta Heimlich Date/Time Eilene Ghazi Time): 02/01/2016 1:41:15 PM  Confirm and document reason for call. If symptomatic, describe symptoms. You must click the next button to save text entered. ---Caller states has an elbow injury. States icing arm right now and broke out in a sweat. States he played tennis yesterday and now his elbow is in a lot of pain. Taken Advil. At the club where he plays, a doctor who was playing also advised him that he thought he had fluid on his elbow. He rates his pain as 9 on 0-10 scale. Goes higher with movement. The Advil did not help with his pain.  Has the patient traveled out of the country within the last 30 days? ---No  Does the patient have any new or worsening symptoms? ---Yes  Will a triage be completed? ---Yes  Related visit to physician within the last 2 weeks? ---No  Does the PT have any chronic conditions? (i.e. diabetes, asthma, etc.) ---Yes  List chronic conditions. ---Type II Diabetes  Is this a behavioral health or substance abuse call? ---No     Guidelines    Guideline Title Affirmed Question Affirmed Notes  Elbow Injury [1] Numbness (i.e., loss of sensation) in fingers AND [2] present now    Final Disposition User   Go to ED Now Mallie Mussel, RN, Alveta Heimlich    Comments  Right index finger is numb.   Referrals  [facility] - ED   Disagree/Comply: Comply

## 2016-02-03 ENCOUNTER — Ambulatory Visit (INDEPENDENT_AMBULATORY_CARE_PROVIDER_SITE_OTHER)
Admission: RE | Admit: 2016-02-03 | Discharge: 2016-02-03 | Disposition: A | Discharge status: Home / Self Care | Payer: 59 | Source: Ambulatory Visit | Attending: Family Medicine | Admitting: Family Medicine

## 2016-02-03 ENCOUNTER — Encounter: Payer: Self-pay | Admitting: Family Medicine

## 2016-02-03 ENCOUNTER — Other Ambulatory Visit: Payer: Self-pay

## 2016-02-03 ENCOUNTER — Telehealth: Payer: Self-pay | Admitting: Pediatrics

## 2016-02-03 ENCOUNTER — Ambulatory Visit (INDEPENDENT_AMBULATORY_CARE_PROVIDER_SITE_OTHER): Payer: 59 | Admitting: Family Medicine

## 2016-02-03 VITALS — BP 112/72 | HR 79 | Wt 186.0 lb

## 2016-02-03 DIAGNOSIS — M25521 Pain in right elbow: Secondary | ICD-10-CM | POA: Diagnosis not present

## 2016-02-03 DIAGNOSIS — Q740 Other congenital malformations of upper limb(s), including shoulder girdle: Secondary | ICD-10-CM | POA: Insufficient documentation

## 2016-02-03 DIAGNOSIS — M25021 Hemarthrosis, right elbow: Secondary | ICD-10-CM

## 2016-02-03 DIAGNOSIS — M25421 Effusion, right elbow: Secondary | ICD-10-CM | POA: Diagnosis not present

## 2016-02-03 DIAGNOSIS — M5010 Cervical disc disorder with radiculopathy, unspecified cervical region: Secondary | ICD-10-CM | POA: Diagnosis not present

## 2016-02-03 HISTORY — DX: Hemarthrosis, right elbow: M25.021

## 2016-02-03 HISTORY — DX: Other congenital malformations of upper limb(s), including shoulder girdle: Q74.0

## 2016-02-03 NOTE — Assessment & Plan Note (Signed)
Wasting noted. We discussed with patient at great length. Concern for possible nerve impingement and patient will bring an EMG. We'll refer to neurology to rule out other disease processes such as ALS.

## 2016-02-03 NOTE — Progress Notes (Signed)
[facility] [place], [place] [address] Phone: [phone] Subjective:     CC: Right elbow swelling  [id number]  Reginald Rios is a 60 y.o. male coming in with complaint of right elbow swelling. Patient states that he was playing tennis on Saturday and did not have any significant pain. Woke up on Sunday with significant amount of swelling as well as decreased range of motion of the right elbow. Does not remember any true injury. States though that the pain is severe. Was seen by another physician and was given prednisone with some mild improvement. Continues to have a decreased range of motion. Continues to have pain overall. Patient can't fully flex or fully extruding his arm. States that there is some radiation down the arm to the hand.     Past Medical History:  Diagnosis Date  . ADD (attention deficit disorder)   . Anemia   . Arthritis   . Diabetes mellitus without complication ([place])   . Exercise-induced asthma   . Heart murmur   . Hyperlipidemia    family hx of high cholesterol  . Hypogonadism in male   . Low back pain   . Osteoarthritis    Past Surgical History:  Procedure Laterality Date  . KNEE ARTHROSCOPY    . TENOTOMY ACHILLES TENDON     Social History   Social History  . Marital status: Married    Spouse name: N/A  . Number of children: N/A  . Years of education: N/A   Social History Main Topics  . Smoking status: Never Smoker  . Smokeless tobacco: Never Used  . Alcohol use 0.0 oz/week     Comment: rare  . Drug use: No  . Sexual activity: Not Asked   Other Topics Concern  . 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 Margurite Auerbach   Moved from [place] up in [place]   Allergies  Allergen Reactions  . Invokana [Canagliflozin] Rash  . Statins Other (See  Comments)    Muscle aches    Family History  Problem Relation Age of Onset  . Lung cancer Mother 41  . Coronary artery disease Father 59  . Hypothyroidism Brother   . Colon cancer Neg Hx   . Prostate cancer Neg Hx     Past medical history, social, surgical and family history all reviewed in electronic medical record.  No pertanent information unless stated regarding to the chief complaint.   Review of Systems: No headache, visual changes, nausea, vomiting, diarrhea, constipation, dizziness, abdominal pain, skin rash, fevers, chills, night sweats, weight loss, swollen lymph nodes, body aches, joint swelling, muscle aches, chest pain, shortness of breath, mood changes.   Objective  Blood pressure 112/72, pulse 79, weight 186 lb (84.4 kg), SpO2 98 %.  General: No apparent distress alert and oriented x3 mood and affect normal, dressed appropriately.  HEENT: Pupils equal, extraocular movements intact  Respiratory: Patient's speak in full sentences and does not appear short of breath  Cardiovascular: No lower extremity edema, non tender, no erythema  Skin: Warm dry intact with no signs of infection or rash on extremities or on axial skeleton.  Abdomen: Soft nontender  Neuro: Cranial nerves II through XII are intact, neurovascularly intact in all extremities with 2+ DTRs and 2+ pulses.  Lymph: No lymphadenopathy of posterior or anterior cervical chain or axillae bilaterally.  Gait normal with good balance and coordination.  MSK:  Non tender with full range of motion and good stability and symmetric strength and tone of shoulders, wrist, hip, knee and ankles bilaterally. Thenar eminence wasting on the right hand  Neck shows still significant decrease in range of motion and positive Spurling's maneuver on the right side.  Elbow: Right Severe swelling noted. Decreased range of motion lacking the last 15 of flexion in the last 30 of extension. Seems stable overall. Diffuse tenderness to  palpation.Marland Kitchen Ulnar nerve does not sublux. Negative cubital tunnel Tinel's. Contralateral elbow unremarkable  Musculoskeletal ultrasound was performed and interpreted by Charlann Boxer D.O.   Elbow:  Mild chronic arthritic changes of the joint noted. Patient also has an effusion of the joint anterior and posterior. Mild partial tearing of the patellar tendon that appears chronic.  IMPRESSION:  Effusion of the joint with partial tear of the tricep tendon  Procedure: Real-time Ultrasound Guided Injection of right elbow Device: GE Logiq E  Ultrasound guided injection is preferred based studies that show increased duration, increased effect, greater accuracy, decreased procedural pain, increased response rate, and decreased cost with ultrasound guided versus blind injection.  Verbal informed consent obtained.  Time-out conducted.  Noted no overlying erythema, induration, or other signs of local infection.  Skin prepped in a sterile fashion.  Local anesthesia: Topical Ethyl chloride.  With sterile technique and under real time ultrasound guidance:  With a 21-gauge 2 inch needle patient was injected with a total of 1 mL of 0.5% Marcaine and aspirated 3 mL of*blood. Completed without difficulty  Pain immediately resolved suggesting accurate placement of the medication.  Advised to call if fevers/chills, erythema, induration, drainage, or persistent bleeding.  Images permanently stored and available for review in the ultrasound unit.  Impression: Technically successful ultrasound guided injection.   Impression and Recommendations:     This case required medical decision making of moderate complexity.      Note: This dictation was prepared with Dragon dictation along with smaller phrase technology. Any transcriptional errors that result from this process are unintentional.

## 2016-02-03 NOTE — Assessment & Plan Note (Signed)
Patient had attempted aspiration today. Attempted to get the fluid out and unfortunately patient did have some hemarthrosis. I'm concern for potential fracture that was not seen on ultrasound. X-rays are pending. Patient has had thenar eminence wasting for quite some time since seems to be worsening. We have tried to work up his neck showed severe osteo-arthritic changes. Patient states that he is getting an EMG that he has the information previously. Patient will come back and see me again in 2 weeks for further evaluation. If worsening symptoms advance imaging may be warranted.

## 2016-02-03 NOTE — Telephone Encounter (Signed)
[facility] - Client [facility] Patient Name: Reginald Rios DOB: Feb 03, 1956 Initial Comment Caller states he can't bend his right elbow. He can't bend his elbow to brush his teeth. Nurse Assessment Nurse: Markus Daft, RN, Sherre Poot Date/Time (Eastern Time): 02/03/2016 8:49:26 AM Confirm and document reason for call. If symptomatic, describe symptoms. You must click the next button to save text entered. ---Caller states Saturday AM, he started to have severe right elbow pain, and sweating a lot. Given prednisone from MD, member of tennis club, on Saturday and told he needed to see a hand doctor. And now he has swelling and mild elbow pain and limited range of motion. He can't bend his right elbow. Pain increases with turning his wrist and so starts wrist and radiates to elbow this AM. He can't bend his elbow to brush his teeth. 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 Has the patient traveled out of the country within the last 30 days? ---Not Applicable Does the patient have any new or worsening symptoms? ---Yes Will a triage be completed? ---Yes Related visit to physician within the last 2 weeks? ---Yes Does the PT have any chronic conditions? (i.e. diabetes, asthma, etc.) ---Yes List chronic conditions. ---Type 2 DM, ulnar nerve moved years ago in right arm and having atrophy and numbness to right hand/fingers Is this a behavioral health or substance abuse call? ---No Guidelines Guideline Title Affirmed Question Affirmed Notes Elbow Pain Weakness (i.e., loss of strength) in hand or fingers (Exception: not truly weak; hand feels weak because of pain) Final Disposition User See Physician within 4 Hours (or PCP triage) Markus Daft, RN, Sherre Poot Comments Pt really wanted to see Dr. Charlann Boxer instead of another doctor. RN will msg MD, but advised caller that he should be seen  within 4 hours. He refuses to go to [place] office, and nothing open at [place] office. Referrals REFERRED TO PCP OFFICE Disagree/Comply: Comply

## 2016-02-03 NOTE — Assessment & Plan Note (Signed)
Severe in nature. Possibly causing the nerve compression that is causing pain in her eminence wasting. Patient will be seen by neurology. We'll refer him today. Depending on how patient does we may need MRI of the neck if patient continues to have weakness.

## 2016-02-03 NOTE — Patient Instructions (Addendum)
Good to see you  I have no idea what you did.  Finish the prednisone.  We will send material to the lab.  We will get xray.  pennsaid pinkie amount topically 2 times daily as needed.  Ice 20 minutes 2 times daily. Usually after activity and before bed. Compression sleeve would be good.  See me again in 2 weeks.

## 2016-02-04 ENCOUNTER — Ambulatory Visit: Payer: 59 | Admitting: Family Medicine

## 2016-02-16 NOTE — Progress Notes (Signed)
[facility] [place], [place] [address] Phone: [phone] Subjective:     CC: Right elbow swelling f/u  [id number]  Reginald Rios is a 60 y.o. male coming in with complaint of right elbow swelling. Found to have a hemarthrosis. This happened after playing tennis. Patient was put in a brace and did have aspiration done. Patient was also found to have mild osteophytic changes. Patient was to take medications decrease activity. Patient states 80% improved at this time. Still some mild pain. 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  Patient was also having weakness as well as wasting of the thenar eminence and was sent for EMG and neurology evaluation. Patient is awaiting those at this moment. Patient states that he is having some increasing neck pain. Having more weakness in the hand than usual. Was found to have thenar eminence wasting. Does give past medical history significant someone wanting to do surgery on his neck greater than 10 years ago.     Past Medical History:  Diagnosis Date  . ADD (attention deficit disorder)   . Anemia   . Arthritis   . Diabetes mellitus without complication ([place])   . Exercise-induced asthma   . Heart murmur   . Hyperlipidemia    family hx of high cholesterol  . Hypogonadism in male   . Low back pain   . Osteoarthritis    Past Surgical History:  Procedure Laterality Date  . KNEE ARTHROSCOPY    . TENOTOMY ACHILLES TENDON     Social History   Social History  . Marital status: Married    Spouse name: N/A  . Number of children: N/A  . Years of education: N/A   Social History Main Topics  . Smoking status: Never Smoker  . Smokeless tobacco: Never Used  . Alcohol use 0.0 oz/week     Comment: rare  . Drug use: No  . Sexual activity: Not on file   Other Topics Concern  . Not on file   Social History Narrative   Previously worked as Geophysicist/field seismologist   Currently working at [employer]   Married 30 years   2 children ages 37, 20   Wife is psychiatrist - Dr. Margurite Auerbach   Moved from [place] up in [place]   Allergies  Allergen Reactions  . Invokana [Canagliflozin] Rash  . Statins Other (See Comments)    Muscle aches    Family History  Problem Relation Age of Onset  . Lung cancer Mother 59  . Coronary artery disease Father 107  . Hypothyroidism Brother   . Colon cancer Neg Hx   . Prostate cancer Neg Hx     Past medical history, social, surgical and family history all reviewed in electronic medical record.  No pertanent information unless stated regarding to the chief complaint.   Review of Systems: No headache, visual changes, nausea, vomiting, diarrhea, constipation, dizziness, abdominal pain, skin rash, fevers, chills, night sweats, weight loss, swollen lymph nodes, body aches, joint swelling, muscle aches, chest pain, shortness of breath, mood changes.   Objective  There were no vitals taken for this visit.  General: No apparent distress alert and oriented x3 mood and affect normal, dressed appropriately.  HEENT: Pupils equal, extraocular movements intact  Respiratory: Patient's speak in full sentences and does not appear short of breath  Cardiovascular: No lower extremity edema, non tender, no erythema  Skin: Warm dry intact with no signs of infection or  rash on extremities or on axial skeleton.  Abdomen: Soft nontender  Neuro: Cranial nerves II through XII are intact, neurovascularly intact in all extremities with 2+ DTRs and 2+ pulses.  Lymph: No lymphadenopathy of posterior or anterior cervical chain or axillae bilaterally.  Gait normal with good balance and coordination.  MSK:  Non tender with full range of motion and good stability and symmetric strength and tone of shoulders, wrist, hip, knee and ankles bilaterally. Thenar eminence wasting on the right hand  Neck shows still significant decrease in range of motion and positive Spurling's  maneuver on the right side. Patient does have some weakness in the C6-C7 distribution on the hand compared to the contralateral side.  Elbow: Right No swelling noted Full range of motion Seems stable overall. Mild tenderness over the tricep tendon Ulnar nerve does not sublux. Negative cubital tunnel Tinel's. Contralateral elbow unremarkable    Impression and Recommendations:     This case required medical decision making of moderate complexity.      Note: This dictation was prepared with Dragon dictation along with smaller phrase technology. Any transcriptional errors that result from this process are unintentional.

## 2016-02-17 ENCOUNTER — Encounter: Payer: Self-pay | Admitting: Family Medicine

## 2016-02-17 ENCOUNTER — Ambulatory Visit (INDEPENDENT_AMBULATORY_CARE_PROVIDER_SITE_OTHER): Payer: 59 | Admitting: Family Medicine

## 2016-02-17 VITALS — BP 108/68 | HR 81 | Wt 185.0 lb

## 2016-02-17 DIAGNOSIS — M25021 Hemarthrosis, right elbow: Secondary | ICD-10-CM | POA: Diagnosis not present

## 2016-02-17 DIAGNOSIS — M5010 Cervical disc disorder with radiculopathy, unspecified cervical region: Secondary | ICD-10-CM | POA: Diagnosis not present

## 2016-02-17 DIAGNOSIS — Q740 Other congenital malformations of upper limb(s), including shoulder girdle: Secondary | ICD-10-CM

## 2016-02-17 MED FILL — PRAVASTATIN NA 10 MG TAB: 10 | 30 days supply | Qty: 30 | Fill #0

## 2016-02-17 NOTE — Assessment & Plan Note (Signed)
Patient is having worsening symptoms at this time. I believe the patient is also having increasing weakness of the right upper extremity. I'm concerned the patient will unfortunate have more chronic problems. The thenar eminence wasting and weakness is concerning for a nerve root impingement and likely cervical. MRI of the neck is ordered today. X-rays show multiple levels of moderate osteophytic changes. Continue the gabapentin until further imaging will discuss further evaluation.

## 2016-02-17 NOTE — Assessment & Plan Note (Signed)
Resolved after aspiration. Patient likely still have some mild tendinitis of the tricep tendon. Should do well with conservative therapy. No significant weakness noted today and significant improvement from previous exam. Follow-up again in 3-4 weeks to make sure completely resolved. Worsening symptoms consider formal physical therapy

## 2016-02-17 NOTE — Patient Instructions (Addendum)
Good to see you  I will call neurology  We need MRI of the neck.   Keep trucking along and I would consider keep doing the compression.  See me again in 4 weeks

## 2016-02-20 ENCOUNTER — Other Ambulatory Visit: Payer: 59

## 2016-02-20 DIAGNOSIS — H8111 Benign paroxysmal vertigo, right ear: Secondary | ICD-10-CM | POA: Diagnosis not present

## 2016-02-20 DIAGNOSIS — M25521 Pain in right elbow: Secondary | ICD-10-CM

## 2016-02-24 MED FILL — TESTOSTERONE 50 MG/5 GRAM G: 50 MG/5GM | 30 days supply | Qty: 150 | Fill #1

## 2016-02-24 MED FILL — MECLIZINE 25 MG TABLET: 25 | 4 days supply | Qty: 12 | Fill #0

## 2016-02-28 ENCOUNTER — Telehealth: Payer: Self-pay

## 2016-02-28 ENCOUNTER — Ambulatory Visit
Admission: RE | Admit: 2016-02-28 | Discharge: 2016-02-28 | Disposition: A | Discharge status: Home / Self Care | Payer: 59 | Source: Ambulatory Visit | Attending: Family Medicine | Admitting: Family Medicine

## 2016-02-28 DIAGNOSIS — Q740 Other congenital malformations of upper limb(s), including shoulder girdle: Secondary | ICD-10-CM

## 2016-02-28 DIAGNOSIS — M4802 Spinal stenosis, cervical region: Secondary | ICD-10-CM | POA: Diagnosis not present

## 2016-02-28 DIAGNOSIS — M5010 Cervical disc disorder with radiculopathy, unspecified cervical region: Secondary | ICD-10-CM

## 2016-02-28 NOTE — Telephone Encounter (Signed)
[facility] has called to ask dr Tamala Julian to review results of mri cervical spine test that is showing in epic---for this patient--dr Tamala Julian has already left for the day---I am creating this note to go to dr Tamala Julian, and greg will be texting dr Tamala Julian to make sure he is aware that results are showing

## 2016-03-02 ENCOUNTER — Encounter: Payer: Self-pay | Admitting: Family Medicine

## 2016-03-02 ENCOUNTER — Telehealth: Payer: Self-pay | Admitting: *Deleted

## 2016-03-02 DIAGNOSIS — M5010 Cervical disc disorder with radiculopathy, unspecified cervical region: Secondary | ICD-10-CM

## 2016-03-02 NOTE — Telephone Encounter (Signed)
Referral entered  

## 2016-03-02 NOTE — Telephone Encounter (Signed)
-----   Message from Lyndal Pulley, DO sent at 03/01/2016  5:42 AM EDT ----- Please refer patient to neurosurgery for severe cervical spinal stenosis

## 2016-03-16 ENCOUNTER — Ambulatory Visit (INDEPENDENT_AMBULATORY_CARE_PROVIDER_SITE_OTHER): Payer: 59 | Admitting: Cardiology

## 2016-03-16 ENCOUNTER — Encounter: Payer: Self-pay | Admitting: Cardiology

## 2016-03-16 VITALS — BP 132/60 | HR 75 | Ht 70.0 in | Wt 188.0 lb

## 2016-03-16 DIAGNOSIS — Z8249 Family history of ischemic heart disease and other diseases of the circulatory system: Secondary | ICD-10-CM

## 2016-03-16 DIAGNOSIS — I10 Essential (primary) hypertension: Secondary | ICD-10-CM | POA: Diagnosis not present

## 2016-03-16 DIAGNOSIS — Z6826 Body mass index (BMI) 26.0-26.9, adult: Secondary | ICD-10-CM | POA: Diagnosis not present

## 2016-03-16 DIAGNOSIS — E785 Hyperlipidemia, unspecified: Secondary | ICD-10-CM | POA: Diagnosis not present

## 2016-03-16 DIAGNOSIS — E782 Mixed hyperlipidemia: Secondary | ICD-10-CM

## 2016-03-16 DIAGNOSIS — I428 Other cardiomyopathies: Secondary | ICD-10-CM | POA: Diagnosis not present

## 2016-03-16 DIAGNOSIS — G5621 Lesion of ulnar nerve, right upper limb: Secondary | ICD-10-CM | POA: Diagnosis not present

## 2016-03-16 DIAGNOSIS — M4712 Other spondylosis with myelopathy, cervical region: Secondary | ICD-10-CM | POA: Diagnosis not present

## 2016-03-16 MED ORDER — VALSARTAN 80 MG PO TABS: 80.0000 mg | ORAL_TABLET | Freq: Every day | ORAL | 11 refills | Status: DC

## 2016-03-16 MED ORDER — PRAVASTATIN SODIUM 10 MG PO TABS: 10.0000 mg | ORAL_TABLET | Freq: Every evening | ORAL | 11 refills | Status: DC

## 2016-03-16 MED ORDER — PRAVASTATIN SODIUM 10 MG PO TABS
10.0000 mg | ORAL_TABLET | Freq: Every evening | ORAL | 11 refills | Status: DC
Start: 2016-03-16 — End: 2016-03-16

## 2016-03-16 MED FILL — PRAVASTATIN NA 10 MG TAB: 10 | 90 days supply | Qty: 90 | Fill #0

## 2016-03-16 NOTE — Patient Instructions (Signed)

## 2016-03-16 NOTE — Progress Notes (Signed)
Patient ID: Reginald Rios, male   DOB: 01/24/1956, 60 y.o.   MRN: [id number]      Cardiology Office Note   Date:  03/16/2016   ID:  Reginald Rios, DOB [birth year], MRN [id number]  PCP:  Anthoney Harada, MD  Cardiologist:  Ena Dawley, MD   Chief complain: DOE, fatigue  History of Present Illness: [name] is a very pleasant 60 y.o. male , whose wife is a psychiatrist and comes in for new DOE and exertional fatigue. The patient is very active playing tennis 6x/week, hiking, still working. He has no prior cardiac history, both of his parents died of CVA in 22'.  He has h/o DM x 7 years.  He has noticed that lately he gets progressively more tired with exertion and on his most recent hike he was not able to keep up with his wife. He denies and CP, palpitations or syncope. No claudications.  He is followed by PCP, no lipid management in the past.   05/15/2015 - this is 2 months follow-up, the patient underwent stress testing that showed no ischemia but decreased ejection fraction that was confirmed on the echocardiogram with LVEF 45-50%. He was prescribed pravastatin and losartan. Today he states that he feels like he has even less energy and some days he just doesn't seem like doing anything. His wife believes he is being depressed. He doesn't remember being depressed before. He denies any chest pain or shortness of breath. He continues to play tennis but only 3 times a week not 7 times a week. He uses Ritalin multiple times a day for energy especially before playing tennis. He denies any orthopnea, paroxysmal nocturnal dyspnea or lower extremity edema.  08/28/2015 - 3 months follow-up, patient denies any chest pain shortness of breath lower extremity edema orthopnea or proximal nocturnal dyspnea. However he continues feeling tired. 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 set. We  have repeated his echocardiogram in March 2017 and his TTE and LVEF has improved now low-normal estimated at 50-55%. He is complaining of chronic nagging cough that is nonproductive for the last 2 months. He has stopped taking pravastatin as he believed was making him depressed.  03/16/2016 - 6 months follow-up, the patient continues to play tennis 3 times a week and has no chest pain or shortness of breath, he states that he feels more tired than several years ago but no sudden change rather he attributes it to aging. He has had a normal stress test in October 2016. He has been experiencing neck pain and right arm pain related to C5-C6 radical fatigue and is seeing neurosurgeon later today. Denies any palpitations, dizziness or syncope. He is tolerating pravastatin much better than atorvastatin.  Past Medical History:  Diagnosis Date  . ADD (attention deficit disorder)   . Anemia   . Arthritis   . Diabetes mellitus without complication ([place])   . Exercise-induced asthma   . Heart murmur   . Hyperlipidemia    family hx of high cholesterol  . Hypogonadism in male   . Low back pain   . Osteoarthritis     Past Surgical History:  Procedure Laterality Date  . KNEE ARTHROSCOPY    . TENOTOMY ACHILLES TENDON       Current Outpatient Prescriptions  Medication Sig Dispense Refill  . gabapentin (NEURONTIN) 100 MG capsule Take 1 capsule (100 mg total)  by mouth at bedtime. 90 capsule 3  . JANUMET 50-1000 MG per tablet TAKE 1 TABLET BY MOUTH TWICE DAILY WITH A MEAL 180 tablet 1  . meloxicam (MOBIC) 15 MG tablet Take 1 tablet (15 mg total) by mouth daily. 30 tablet 0  . methylphenidate (RITALIN) 20 MG tablet Take 20 mg by mouth daily.    . montelukast (SINGULAIR) 10 MG tablet Take 1 tablet (10 mg total) by mouth daily. 90 tablet 3  . nateglinide (STARLIX) 120 MG tablet Take 1 tablet (120 mg total) by mouth daily. 90 tablet 1  . pravastatin (PRAVACHOL) 10 MG tablet Take 1 tablet (10 mg total) by  mouth every evening. 30 tablet 6  . tadalafil (CIALIS) 10 MG tablet Take 1 tablet (10 mg total) by mouth daily as needed for erectile dysfunction. 10 tablet 3  . testosterone (ANDROGEL) 50 MG/5GM (1%) GEL APPLY 5 GRAMS (1 TUBE) TO EACH UPPER ARM AREA EVERY MORNING 150 g 3  . valsartan (DIOVAN) 80 MG tablet Take 1 tablet (80 mg total) by mouth daily. 30 tablet 11  . VENTOLIN HFA 108 (90 BASE) MCG/ACT inhaler Inhale 1 puff into the lungs every 6 (six) hours as needed (cough).   1   No current facility-administered medications for this visit.     Allergies:   Invokana [canagliflozin] and Statins    Social History:  The patient  reports that he has never smoked. He has never used smokeless tobacco. He reports that he drinks alcohol. He reports that he does not use drugs.   Family History:  The patient's family history includes Coronary artery disease (age of onset: 54) in his father; Hypothyroidism in his brother; Lung cancer (age of onset: 65) in his mother.   ROS:  Please see the history of present illness.   Otherwise, review of systems are positive for none.   All other systems are reviewed and negative.   PHYSICAL EXAM: VS:  BP 132/60   Pulse 75   Ht 5\' 10"  (1.778 m)   Wt 188 lb (85.3 kg)   BMI 26.98 kg/m  , BMI Body mass index is 26.98 kg/m. GEN: Well nourished, well developed, in no acute distress  HEENT: normal  Neck: no JVD, carotid bruits, or masses Cardiac: RRR; no murmurs, rubs, or gallops,no edema  Respiratory:  clear to auscultation bilaterally, normal work of breathing GI: soft, nontender, nondistended, + BS MS: no deformity or atrophy  Skin: warm and dry, no rash Neuro:  Strength and sensation are intact Psych: euthymic mood, full affect  EKG: SR, LVH  Recent Labs: 06/03/2015: BUN 13; Creat 0.69; Potassium 4.2; Sodium 137 09/16/2015: ALT 14   Lipid Panel    Component Value Date/Time   CHOL 126 09/16/2015 1038   TRIG 56 09/16/2015 1038   HDL 47 09/16/2015 1038    CHOLHDL 2.7 09/16/2015 1038   VLDL 11 09/16/2015 1038   LDLCALC 68 09/16/2015 1038    Wt Readings from Last 3 Encounters:  03/16/16 188 lb (85.3 kg)  02/17/16 185 lb (83.9 kg)  02/03/16 186 lb (84.4 kg)    Other studies Reviewed: Records from PCP  Nuclear stress test:  Nuclear stress EF: 52%.  There was no ST segment deviation noted during stress.  The left ventricular ejection fraction is mildly decreased (45-54%).  This is a low risk study.  No ischemia Thinning of the mid and basal inferior wall with normal motion consistent with diaphragmatic attenuation EF 52%   TTE: 07/03/2015 -  Left ventricle: The cavity size was normal. Systolic function was  normal. The estimated ejection fraction was in the range of 50%  to 55%. Wall motion was normal; there were no regional wall  motion abnormalities. Doppler parameters are consistent with  abnormal left ventricular relaxation (grade 1 diastolic  dysfunction). There was no evidence of elevated ventricular  filling pressure by Doppler parameters. - Aortic valve: Trileaflet; normal thickness leaflets. There was no  regurgitation. - Mitral valve: Mildly thickened leaflets . There was trivial  regurgitation. - Right ventricle: The cavity size was normal. Wall thickness was  normal. Systolic function was normal. - Right atrium: The atrium was normal in size. - Tricuspid valve: There was no regurgitation. - Pulmonary arteries: Systolic pressure was within the normal  range. - Inferior vena cava: The vessel was normal in size. - Pericardium, extracardiac: There was no pericardial effusion.  EKG done on 08/28/2015 shows normal sinus rhythm with ventricular rate of 63 bpm normal EKG that unchanged from prior.   ASSESSMENT AND PLAN:  1. DOE, exertional fatigue - normal exercise nuclear stress test with no evidence of infarct or ischemia. However decreased in LVEF. This was confirmed on echocardiogram with diffuse  hypokinesis with LVEF 45-50%, this has improved to 50-55%, he was intolerant to losartan and lisinopril currently tolerating valsartan. He is shortness of breath has improved.  2. Nonischemic cardiomyopathy - LVEF now low normal, NYHA class I.  3. Hyperlipidemia - he was intolerant to atorvastatin, he was switched to pravastatin 10 mg daily and lipids were at goal in May 2017.  4. Depression -we have tried changing several medication with no significant improvement. Doubt that cardiac medicines are causing his depression.  5. Preoperative evaluation for possible cervical spine surgery, there will be no contraindication for this patient to undergo his surgery, it would be also okay to discontinue aspirin in the perioperative period.  Follow up in 1 year.  Signed, Ena Dawley, MD  03/16/2016 9:53 AM    [address], [place], [place]  [address] Phone: [phone]; Fax: [phone]

## 2016-03-20 MED FILL — VALSARTAN 80 MG TABLET: 80 | 90 days supply | Qty: 90 | Fill #0

## 2016-03-23 DIAGNOSIS — G5621 Lesion of ulnar nerve, right upper limb: Secondary | ICD-10-CM | POA: Diagnosis not present

## 2016-03-23 DIAGNOSIS — G5630 Lesion of radial nerve, unspecified upper limb: Secondary | ICD-10-CM

## 2016-03-23 DIAGNOSIS — M9981 Other biomechanical lesions of cervical region: Secondary | ICD-10-CM | POA: Diagnosis not present

## 2016-03-23 DIAGNOSIS — G5632 Lesion of radial nerve, left upper limb: Secondary | ICD-10-CM | POA: Diagnosis not present

## 2016-03-23 HISTORY — DX: Lesion of radial nerve, unspecified upper limb: G56.30

## 2016-04-01 ENCOUNTER — Ambulatory Visit: Payer: 59 | Admitting: Neurology

## 2016-04-06 MED FILL — TESTOSTERONE 50 MG/5 GRAM G: 50 MG/5GM | 30 days supply | Qty: 150 | Fill #2

## 2016-04-13 MED FILL — JANUMET 50-1,000 MG TABLET: 50-1000 | 90 days supply | Qty: 180 | Fill #0

## 2016-04-13 MED FILL — GABAPENTIN 100 MG CAPSULE: 100 | 90 days supply | Qty: 90 | Fill #1

## 2016-04-20 DIAGNOSIS — E7800 Pure hypercholesterolemia, unspecified: Secondary | ICD-10-CM | POA: Diagnosis not present

## 2016-04-20 DIAGNOSIS — E119 Type 2 diabetes mellitus without complications: Secondary | ICD-10-CM | POA: Diagnosis not present

## 2016-04-20 DIAGNOSIS — M25521 Pain in right elbow: Secondary | ICD-10-CM | POA: Diagnosis not present

## 2016-04-20 DIAGNOSIS — R202 Paresthesia of skin: Secondary | ICD-10-CM | POA: Diagnosis not present

## 2016-04-20 DIAGNOSIS — M25511 Pain in right shoulder: Secondary | ICD-10-CM | POA: Diagnosis not present

## 2016-04-20 DIAGNOSIS — E291 Testicular hypofunction: Secondary | ICD-10-CM | POA: Diagnosis not present

## 2016-04-20 DIAGNOSIS — N529 Male erectile dysfunction, unspecified: Secondary | ICD-10-CM | POA: Diagnosis not present

## 2016-04-20 DIAGNOSIS — Z5181 Encounter for therapeutic drug level monitoring: Secondary | ICD-10-CM | POA: Diagnosis not present

## 2016-04-20 DIAGNOSIS — Z23 Encounter for immunization: Secondary | ICD-10-CM | POA: Diagnosis not present

## 2016-04-21 MED FILL — METHYLPHENIDATE 20 MG TAB: 20 | 30 days supply | Qty: 60 | Fill #0

## 2016-05-06 DIAGNOSIS — M4712 Other spondylosis with myelopathy, cervical region: Secondary | ICD-10-CM | POA: Diagnosis not present

## 2016-05-08 MED FILL — TESTOSTERONE 50 MG/5 GRAM G: 50 MG/5GM | 30 days supply | Qty: 150 | Fill #3

## 2016-06-12 MED FILL — TESTOSTERONE 50 MG/5 GRAM G: 50 MG/5GM | 30 days supply | Qty: 150 | Fill #4

## 2016-06-16 MED FILL — METHYLPHENIDATE 20 MG TAB: 20 | 30 days supply | Qty: 60 | Fill #0

## 2016-06-22 DIAGNOSIS — M4722 Other spondylosis with radiculopathy, cervical region: Secondary | ICD-10-CM | POA: Diagnosis not present

## 2016-06-22 DIAGNOSIS — M50121 Cervical disc disorder at C4-C5 level with radiculopathy: Secondary | ICD-10-CM | POA: Diagnosis not present

## 2016-06-22 DIAGNOSIS — M50222 Other cervical disc displacement at C5-C6 level: Secondary | ICD-10-CM | POA: Diagnosis not present

## 2016-06-22 DIAGNOSIS — M4712 Other spondylosis with myelopathy, cervical region: Secondary | ICD-10-CM | POA: Diagnosis not present

## 2016-06-22 DIAGNOSIS — M50321 Other cervical disc degeneration at C4-C5 level: Secondary | ICD-10-CM | POA: Diagnosis not present

## 2016-06-22 MED FILL — diazePAM 5 MG TABS: 5 | 15 days supply | Qty: 60 | Fill #0

## 2016-06-22 MED FILL — OXYCODONE W/APAP 5/325 TAB: 5-325 | 5 days supply | Qty: 60 | Fill #0

## 2016-07-07 MED FILL — PRAVASTATIN NA 10 MG TAB: 10 | 90 days supply | Qty: 90 | Fill #1

## 2016-07-09 DIAGNOSIS — M4712 Other spondylosis with myelopathy, cervical region: Secondary | ICD-10-CM | POA: Diagnosis not present

## 2016-07-09 DIAGNOSIS — M4317 Spondylolisthesis, lumbosacral region: Secondary | ICD-10-CM | POA: Diagnosis not present

## 2016-07-11 DIAGNOSIS — M9902 Segmental and somatic dysfunction of thoracic region: Secondary | ICD-10-CM | POA: Diagnosis not present

## 2016-07-11 DIAGNOSIS — M4712 Other spondylosis with myelopathy, cervical region: Secondary | ICD-10-CM | POA: Diagnosis not present

## 2016-07-11 DIAGNOSIS — M9901 Segmental and somatic dysfunction of cervical region: Secondary | ICD-10-CM | POA: Diagnosis not present

## 2016-07-11 DIAGNOSIS — M542 Cervicalgia: Secondary | ICD-10-CM | POA: Diagnosis not present

## 2016-07-14 DIAGNOSIS — M542 Cervicalgia: Secondary | ICD-10-CM | POA: Diagnosis not present

## 2016-07-14 DIAGNOSIS — M9902 Segmental and somatic dysfunction of thoracic region: Secondary | ICD-10-CM | POA: Diagnosis not present

## 2016-07-14 DIAGNOSIS — M9901 Segmental and somatic dysfunction of cervical region: Secondary | ICD-10-CM | POA: Diagnosis not present

## 2016-07-14 DIAGNOSIS — M4712 Other spondylosis with myelopathy, cervical region: Secondary | ICD-10-CM | POA: Diagnosis not present

## 2016-07-16 DIAGNOSIS — M9901 Segmental and somatic dysfunction of cervical region: Secondary | ICD-10-CM | POA: Diagnosis not present

## 2016-07-16 DIAGNOSIS — M4712 Other spondylosis with myelopathy, cervical region: Secondary | ICD-10-CM | POA: Diagnosis not present

## 2016-07-16 DIAGNOSIS — M542 Cervicalgia: Secondary | ICD-10-CM | POA: Diagnosis not present

## 2016-07-16 DIAGNOSIS — M9902 Segmental and somatic dysfunction of thoracic region: Secondary | ICD-10-CM | POA: Diagnosis not present

## 2016-07-20 DIAGNOSIS — M9901 Segmental and somatic dysfunction of cervical region: Secondary | ICD-10-CM | POA: Diagnosis not present

## 2016-07-20 DIAGNOSIS — M4712 Other spondylosis with myelopathy, cervical region: Secondary | ICD-10-CM | POA: Diagnosis not present

## 2016-07-20 DIAGNOSIS — M9902 Segmental and somatic dysfunction of thoracic region: Secondary | ICD-10-CM | POA: Diagnosis not present

## 2016-07-20 DIAGNOSIS — M542 Cervicalgia: Secondary | ICD-10-CM | POA: Diagnosis not present

## 2016-07-20 MED FILL — GABAPENTIN 100 MG CAP: 100 | 90 days supply | Qty: 90 | Fill #2

## 2016-07-30 MED FILL — TESTOSTERONE 50 MG/5 GRAM G: 50 MG/5GM | 30 days supply | Qty: 150 | Fill #0

## 2016-07-31 MED FILL — JANUMET 50-1,000 MG TABLET: 50-1000 | 30 days supply | Qty: 60 | Fill #1

## 2016-08-11 DIAGNOSIS — M9902 Segmental and somatic dysfunction of thoracic region: Secondary | ICD-10-CM | POA: Diagnosis not present

## 2016-08-11 DIAGNOSIS — M542 Cervicalgia: Secondary | ICD-10-CM | POA: Diagnosis not present

## 2016-08-11 DIAGNOSIS — M9901 Segmental and somatic dysfunction of cervical region: Secondary | ICD-10-CM | POA: Diagnosis not present

## 2016-08-11 DIAGNOSIS — M4712 Other spondylosis with myelopathy, cervical region: Secondary | ICD-10-CM | POA: Diagnosis not present

## 2016-08-12 DIAGNOSIS — M4712 Other spondylosis with myelopathy, cervical region: Secondary | ICD-10-CM | POA: Diagnosis not present

## 2016-08-12 DIAGNOSIS — M4317 Spondylolisthesis, lumbosacral region: Secondary | ICD-10-CM | POA: Diagnosis not present

## 2016-08-17 DIAGNOSIS — F988 Other specified behavioral and emotional disorders with onset usually occurring in childhood and adolescence: Secondary | ICD-10-CM | POA: Diagnosis not present

## 2016-08-17 DIAGNOSIS — E119 Type 2 diabetes mellitus without complications: Secondary | ICD-10-CM | POA: Diagnosis not present

## 2016-08-17 DIAGNOSIS — Z5181 Encounter for therapeutic drug level monitoring: Secondary | ICD-10-CM | POA: Diagnosis not present

## 2016-08-17 DIAGNOSIS — J45909 Unspecified asthma, uncomplicated: Secondary | ICD-10-CM | POA: Diagnosis not present

## 2016-08-17 DIAGNOSIS — E7800 Pure hypercholesterolemia, unspecified: Secondary | ICD-10-CM | POA: Diagnosis not present

## 2016-08-17 DIAGNOSIS — E291 Testicular hypofunction: Secondary | ICD-10-CM | POA: Diagnosis not present

## 2016-08-17 DIAGNOSIS — Z23 Encounter for immunization: Secondary | ICD-10-CM | POA: Diagnosis not present

## 2016-08-17 DIAGNOSIS — R202 Paresthesia of skin: Secondary | ICD-10-CM | POA: Diagnosis not present

## 2016-08-17 DIAGNOSIS — N529 Male erectile dysfunction, unspecified: Secondary | ICD-10-CM | POA: Diagnosis not present

## 2016-08-17 MED FILL — MONTELUKAST SOD 10 MG TAB: 10 | 90 days supply | Qty: 90 | Fill #0

## 2016-08-17 MED FILL — VALSARTAN 80 MG TABLET: 80 | 30 days supply | Qty: 30 | Fill #0

## 2016-08-18 MED FILL — PRAVASTATIN NA 20 MG TAB: 20 | 90 days supply | Qty: 90 | Fill #0

## 2016-08-20 DIAGNOSIS — M542 Cervicalgia: Secondary | ICD-10-CM | POA: Diagnosis not present

## 2016-08-20 DIAGNOSIS — M9901 Segmental and somatic dysfunction of cervical region: Secondary | ICD-10-CM | POA: Diagnosis not present

## 2016-08-20 DIAGNOSIS — M9902 Segmental and somatic dysfunction of thoracic region: Secondary | ICD-10-CM | POA: Diagnosis not present

## 2016-08-20 DIAGNOSIS — M4712 Other spondylosis with myelopathy, cervical region: Secondary | ICD-10-CM | POA: Diagnosis not present

## 2016-08-21 MED FILL — METHYLPHENIDATE 20 MG TAB: 20 | 60 days supply | Qty: 60 | Fill #0

## 2016-08-25 DIAGNOSIS — M542 Cervicalgia: Secondary | ICD-10-CM | POA: Diagnosis not present

## 2016-08-25 DIAGNOSIS — M9901 Segmental and somatic dysfunction of cervical region: Secondary | ICD-10-CM | POA: Diagnosis not present

## 2016-08-25 DIAGNOSIS — M4712 Other spondylosis with myelopathy, cervical region: Secondary | ICD-10-CM | POA: Diagnosis not present

## 2016-08-25 DIAGNOSIS — M9902 Segmental and somatic dysfunction of thoracic region: Secondary | ICD-10-CM | POA: Diagnosis not present

## 2016-08-27 DIAGNOSIS — M542 Cervicalgia: Secondary | ICD-10-CM | POA: Diagnosis not present

## 2016-08-27 DIAGNOSIS — M9901 Segmental and somatic dysfunction of cervical region: Secondary | ICD-10-CM | POA: Diagnosis not present

## 2016-08-27 DIAGNOSIS — M4712 Other spondylosis with myelopathy, cervical region: Secondary | ICD-10-CM | POA: Diagnosis not present

## 2016-08-27 DIAGNOSIS — M9902 Segmental and somatic dysfunction of thoracic region: Secondary | ICD-10-CM | POA: Diagnosis not present

## 2016-08-31 MED FILL — TESTOSTERONE 50 MG/5 GRAM G: 50 MG/5GM | 15 days supply | Qty: 150 | Fill #0

## 2016-08-31 MED FILL — JANUMET 50-1,000 MG TABLET: 50-1000 | 30 days supply | Qty: 60 | Fill #2

## 2016-09-08 DIAGNOSIS — M9901 Segmental and somatic dysfunction of cervical region: Secondary | ICD-10-CM | POA: Diagnosis not present

## 2016-09-08 DIAGNOSIS — M542 Cervicalgia: Secondary | ICD-10-CM | POA: Diagnosis not present

## 2016-09-08 DIAGNOSIS — M9902 Segmental and somatic dysfunction of thoracic region: Secondary | ICD-10-CM | POA: Diagnosis not present

## 2016-09-08 DIAGNOSIS — M4712 Other spondylosis with myelopathy, cervical region: Secondary | ICD-10-CM | POA: Diagnosis not present

## 2016-09-15 DIAGNOSIS — M4712 Other spondylosis with myelopathy, cervical region: Secondary | ICD-10-CM | POA: Diagnosis not present

## 2016-09-15 DIAGNOSIS — M9901 Segmental and somatic dysfunction of cervical region: Secondary | ICD-10-CM | POA: Diagnosis not present

## 2016-09-15 DIAGNOSIS — M542 Cervicalgia: Secondary | ICD-10-CM | POA: Diagnosis not present

## 2016-09-15 DIAGNOSIS — M9902 Segmental and somatic dysfunction of thoracic region: Secondary | ICD-10-CM | POA: Diagnosis not present

## 2016-09-16 DIAGNOSIS — M4317 Spondylolisthesis, lumbosacral region: Secondary | ICD-10-CM | POA: Diagnosis not present

## 2016-09-17 ENCOUNTER — Other Ambulatory Visit (HOSPITAL_BASED_OUTPATIENT_CLINIC_OR_DEPARTMENT_OTHER): Payer: Self-pay | Admitting: Neurological Surgery

## 2016-09-17 DIAGNOSIS — M4317 Spondylolisthesis, lumbosacral region: Secondary | ICD-10-CM

## 2016-09-18 MED FILL — TESTOSTERONE 50 MG/5 GRAM G: 50 MG/5GM | 15 days supply | Qty: 150 | Fill #1

## 2016-09-19 ENCOUNTER — Ambulatory Visit (HOSPITAL_BASED_OUTPATIENT_CLINIC_OR_DEPARTMENT_OTHER)
Admission: RE | Admit: 2016-09-19 | Discharge: 2016-09-19 | Disposition: A | Discharge status: Home / Self Care | Payer: 59 | Source: Ambulatory Visit | Attending: Neurological Surgery | Admitting: Neurological Surgery

## 2016-09-19 DIAGNOSIS — M48061 Spinal stenosis, lumbar region without neurogenic claudication: Secondary | ICD-10-CM | POA: Insufficient documentation

## 2016-09-19 DIAGNOSIS — M4807 Spinal stenosis, lumbosacral region: Secondary | ICD-10-CM | POA: Diagnosis not present

## 2016-09-19 DIAGNOSIS — M4317 Spondylolisthesis, lumbosacral region: Secondary | ICD-10-CM | POA: Insufficient documentation

## 2016-09-22 DIAGNOSIS — M9902 Segmental and somatic dysfunction of thoracic region: Secondary | ICD-10-CM | POA: Diagnosis not present

## 2016-09-22 DIAGNOSIS — M542 Cervicalgia: Secondary | ICD-10-CM | POA: Diagnosis not present

## 2016-09-22 DIAGNOSIS — M4712 Other spondylosis with myelopathy, cervical region: Secondary | ICD-10-CM | POA: Diagnosis not present

## 2016-09-22 DIAGNOSIS — M9901 Segmental and somatic dysfunction of cervical region: Secondary | ICD-10-CM | POA: Diagnosis not present

## 2016-09-23 MED FILL — VALSARTAN 80 MG TABLET: 80 | 30 days supply | Qty: 30 | Fill #1

## 2016-09-24 ENCOUNTER — Ambulatory Visit: Payer: 59 | Admitting: Physical Therapy

## 2016-09-24 DIAGNOSIS — M9902 Segmental and somatic dysfunction of thoracic region: Secondary | ICD-10-CM | POA: Diagnosis not present

## 2016-09-24 DIAGNOSIS — M9901 Segmental and somatic dysfunction of cervical region: Secondary | ICD-10-CM | POA: Diagnosis not present

## 2016-09-24 DIAGNOSIS — M542 Cervicalgia: Secondary | ICD-10-CM | POA: Diagnosis not present

## 2016-09-24 DIAGNOSIS — M4712 Other spondylosis with myelopathy, cervical region: Secondary | ICD-10-CM | POA: Diagnosis not present

## 2016-09-29 DIAGNOSIS — M4712 Other spondylosis with myelopathy, cervical region: Secondary | ICD-10-CM | POA: Diagnosis not present

## 2016-09-29 DIAGNOSIS — M9902 Segmental and somatic dysfunction of thoracic region: Secondary | ICD-10-CM | POA: Diagnosis not present

## 2016-09-29 DIAGNOSIS — M542 Cervicalgia: Secondary | ICD-10-CM | POA: Diagnosis not present

## 2016-09-29 DIAGNOSIS — M9901 Segmental and somatic dysfunction of cervical region: Secondary | ICD-10-CM | POA: Diagnosis not present

## 2016-10-01 ENCOUNTER — Ambulatory Visit: Payer: 59 | Attending: Neurological Surgery | Admitting: Physical Therapy

## 2016-10-01 DIAGNOSIS — M542 Cervicalgia: Secondary | ICD-10-CM | POA: Insufficient documentation

## 2016-10-01 DIAGNOSIS — M545 Low back pain: Secondary | ICD-10-CM | POA: Diagnosis not present

## 2016-10-01 DIAGNOSIS — R293 Abnormal posture: Secondary | ICD-10-CM | POA: Diagnosis not present

## 2016-10-01 NOTE — Patient Instructions (Signed)
Flexibility: Upper Trapezius Stretch    Gently grasp right side of head while reaching behind back with other hand. Tilt head away until a gentle stretch is felt. Hold __30__ seconds. Repeat __3__ times per set. Do __3-5__ sessions per day.   Levator Stretch    Grasp seat or sit on hand on side to be stretched. Turn head toward other side and look down. Use hand on head to gently stretch neck in that position. Hold __30__ seconds. Repeat on other side. Repeat __3__ times. Do __3-5__ sessions per day.   Scapular Retraction (Standing)    With arms at sides, pinch shoulder blades together. Repeat __15__ times per set. Do __2__ sets per session.    Axial Extension (Chin Tuck)    Pull chin in and lengthen back of neck. Hold _5-10___ seconds while counting out loud. Repeat _15___ times.    Lower lumbar rotation    Gentle - 10 times each side - hold 10 seconds   Hamstring Step 2    Left foot relaxed, knee straight, other leg bent, foot flat. Raise straight leg further upward to maximal range. Hold __30_ seconds. Relax leg completely down. Repeat __3_ times.

## 2016-10-01 NOTE — Therapy (Signed)
[facility] [address]  [place], [place], [address] Phone: [phone]   Fax:  [phone]  Physical Therapy Evaluation  Patient Details  Name: Reginald Rios MRN: [id number] Date of Birth: [birth year] Referring Provider: Dr. Kristeen Miss  Encounter Date: 10/01/2016      PT End of Session - 10/01/16 1802    Visit Number 1   Number of Visits 12   Date for PT Re-Evaluation 11/12/16   PT Start Time 0909   PT Stop Time 1008   PT Time Calculation (min) 59 min   Activity Tolerance Patient tolerated treatment well   Behavior During Therapy [facility] for tasks assessed/performed      Past Medical History:  Diagnosis Date  . ADD (attention deficit disorder)   . Anemia   . Arthritis   . Diabetes mellitus without complication ([place])   . Exercise-induced asthma   . Heart murmur   . Hyperlipidemia    family hx of high cholesterol  . Hypogonadism in male   . Low back pain   . Osteoarthritis     Past Surgical History:  Procedure Laterality Date  . KNEE ARTHROSCOPY    . TENOTOMY ACHILLES TENDON      There were no vitals filed for this visit.       Subjective Assessment - 10/01/16 0909    Subjective Patient reporting a history of neck stiffness - feels like whole body is stiff. Cervical fusion - was having atrophy and numbness of hands - does report difficulty with fine motor tasks of hands. Does report low back pain and pain radiating into legs. Recent MRI with patient reporting "disc bulging". Has been seeing chiro - 1x/week - feels like this has helped - has only been doing manipulations. Report difficulty pushing gas pedal, as well as prolonged positioning. Reports buckling of LEs x2 - 2 weeks ago.    Pertinent History Cervical fusion   How long can you sit comfortably? 20-30 minutes, reclined and with head rest   How long can you stand comfortably? constant positional changes   How long can you walk comfortably?  uncomfortable   Diagnostic tests MRI: see chart   Patient Stated Goals reduce pain, improve quality of life   Currently in Pain? Yes   Pain Score 2   0-10 low back at rest   Pain Location Neck   Pain Orientation Posterior   Pain Descriptors / Indicators Aching;Sore   Pain Type Chronic pain   Pain Radiating Towards Neck pain radiates into B upper trap; back pain radiates into B LE   Pain Onset More than a month ago   Pain Frequency Intermittent   Aggravating Factors  prolonged positioning, transfers, forward bending   Pain Relieving Factors hot shower            [facility] PT Assessment - 10/01/16 0905      Assessment   Medical Diagnosis Foraminal stenosis of cervical region   Referring Provider Dr. Kristeen Miss   Next MD Visit 10/17/16   Prior Therapy yes- not for this issue     Precautions   Precautions None     Restrictions   Weight Bearing Restrictions No     Balance Screen   Has the patient fallen in the past 6 months No   Has the patient had a decrease in activity level because of a fear of falling?  No   Is the patient reluctant to leave their home because  of a fear of falling?  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[employer], walking dogs     Cognition   Overall Cognitive Status Within Functional Limits for tasks assessed     Observation/Other Assessments   Focus on Therapeutic Outcomes (FOTO)  Cervical: 53 (47% limited, predicted 37% limited)     Sensation   Light Touch --  reduced sensation B hand 5th digits, R foot great toe     Coordination   Gross Motor Movements are Fluid and  Coordinated Yes     Functional Tests   Functional tests Squat     Squat   Comments anterior weight shift, forward flexed trunk, varus/valgus throughout     Posture/Postural Control   Posture/Postural Control Postural limitations   Postural Limitations Rounded Shoulders;Forward head     ROM / Strength   AROM / PROM / Strength AROM;Strength     AROM   AROM Assessment Site Cervical;Lumbar   Cervical Flexion 50   Cervical Extension 31   Cervical - Right Side Bend 15   Cervical - Left Side Bend 25   Cervical - Right Rotation 30   Cervical - Left Rotation 47   Lumbar Flexion fingertip to anterior ankle joint line   Lumbar Extension L sided pain - 75% limited - pain "shooting up and down"   Lumbar - Right Side Bend fingertip to joint line - no pain   Lumbar - Left Side Bend fingertip to mid thigh - pain in midline and center of back - pain shooting into low back   Lumbar - Right Rotation 50% limited - "tight hips"   Lumbar - Left Rotation 50% limited - B pain that immediately resolves with return to neutral     Strength   Overall Strength Comments B UE grossly 4+/5, B LE grossly 4/5 with heavy effort   Strength Assessment Site Hand   Right/Left hand Right;Left   Right Hand Grip (lbs) 50   Left Hand Grip (lbs) 30     Flexibility   Soft Tissue Assessment /Muscle Length yes   Hamstrings slight B tightness     Palpation   Spinal mobility tightness throughout lumbar region, cervical not assessed due to reported fusion (hardware placement)   Palpation comment tightness throughout paraspinals (cervical to lumbar), B UT, B LS, B suboccipital mm     Special Tests    Special Tests Lumbar   Lumbar Tests Straight Leg Raise     Straight Leg Raise   Findings Positive   Side  Left            Objective measurements completed on examination: See above findings.          [facility] Adult PT Treatment/Exercise - 10/01/16 0905      Exercises   Exercises Neck;Lumbar     Neck  Exercises: Seated   Neck Retraction 10 reps;5 secs   Other Seated Exercise scap retraction 10 x 5 seconds     Lumbar Exercises: Stretches   Passive Hamstring Stretch 3 reps;30 seconds   Passive Hamstring Stretch Limitations B; HEP  review   Single Knee to Chest Stretch 5 reps;10 seconds   Single Knee to Chest Stretch Limitations B; HEP review   Lower Trunk Rotation 5 reps;10 seconds   Lower Trunk Rotation Limitations B; HEP review     Neck Exercises: Stretches   Upper Trapezius Stretch 3 reps;30 seconds   Levator Stretch 3 reps;30 seconds                PT Education - 10/01/16 1801    Education provided Yes   Education Details exam findings, POC, HEP   Person(s) Educated Patient   Methods Explanation;Demonstration;Handout   Comprehension Verbalized understanding;Returned demonstration             PT Long Term Goals - 10/01/16 1807      PT LONG TERM GOAL #1   Title Patient to be independent with advanced HEP (11/12/16)   Status New     PT LONG TERM GOAL #2   Title Patient to improve cervical and lumbar AROM to WNL and symmetrical without pain limiting (11/12/17)   Status New     PT LONG TERM GOAL #3   Title 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 Patient with reduced AROM at both cervical and lumbar spine, as well as moderate muscle tightness noted throughout upper neck, shoulder, thoracic and lumbar spine possibly contributing to overall pain levels. Patient to benefit from skilled PT intervention to address pain,  posture, and functional mobility to improve quality of life and allow patient to return to work/leisure activities with greater functional mobility.    History and Personal Factors relevant to plan of care: cervical fusion of unknown level.    Clinical Presentation Stable   Clinical Decision Making Low   Rehab Potential Good   PT Frequency 2x / week   PT Duration 6 weeks   PT Treatment/Interventions ADLs/Self Care Home Management;Cryotherapy;Electrical Stimulation;Moist Heat;Traction;Ultrasound;Neuromuscular re-education;Balance training;Therapeutic exercise;Therapeutic activities;Functional mobility training;Patient/family education;Manual techniques;Passive range of motion;Vasopneumatic Device;Taping;Dry needling   PT Next Visit Plan heavy manual focus   Consulted and Agree with Plan of Care Patient      Patient will benefit from skilled therapeutic intervention in order to improve the following deficits and impairments:  Decreased activity tolerance, Decreased range of motion, Decreased mobility, Decreased strength, Difficulty walking, Increased muscle spasms, Impaired UE functional use, Pain  Visit Diagnosis: Cervicalgia - Plan: PT plan of care cert/re-cert  Bilateral low back pain, unspecified chronicity, with sciatica presence unspecified - Plan: PT plan of care cert/re-cert  Abnormal posture - Plan: PT plan of care cert/re-cert     Problem List Patient Active Problem List   Diagnosis Date Noted  . Hemarthrosis of right elbow 02/03/2016  . Small thenar eminence 02/03/2016  . Cervical disc disorder with radiculopathy of cervical region 10/07/2015  . Incomplete rotator cuff tear 09/10/2015  . Nonischemic cardiomyopathy ([place]) 05/15/2015  . Fatigue due to depression 05/15/2015  . Dyspnea on exertion 05/15/2015  .  Decreased cardiac ejection fraction 04/02/2015  . Strain of latissimus dorsi muscle 01/21/2015  . Shoulder bursitis 10/12/2014  . Stenosis of lateral recess of lumbar  spine 10/12/2014  . BPPV (benign paroxysmal positional vertigo) 08/14/2014  . Hyperlipidemia 01/19/2014  . Anemia, unspecified 01/19/2014  . ADD (attention deficit disorder) 01/19/2014  . Diabetes type 2, uncontrolled ([place]) 12/22/2013  . Hypogonadism in male 12/22/2013  . Arthritis of hand, left 12/22/2013     Lanney Gins, PT, DPT 10/01/16 6:11 PM   [facility] [address]  [place], [place], [address] Phone: [phone]   Fax:  [phone]  Name: Javaris Wigington MRN: [id number] Date of Birth: 28-Aug-1955

## 2016-10-02 MED FILL — JANUMET 50-1,000 MG TABLET: 50-1000 | 30 days supply | Qty: 60 | Fill #3

## 2016-10-05 ENCOUNTER — Ambulatory Visit (INDEPENDENT_AMBULATORY_CARE_PROVIDER_SITE_OTHER): Payer: 59 | Admitting: Orthopaedic Surgery

## 2016-10-05 ENCOUNTER — Encounter: Payer: Self-pay | Admitting: Internal Medicine

## 2016-10-05 ENCOUNTER — Encounter (INDEPENDENT_AMBULATORY_CARE_PROVIDER_SITE_OTHER): Payer: Self-pay | Admitting: Orthopaedic Surgery

## 2016-10-05 ENCOUNTER — Ambulatory Visit (INDEPENDENT_AMBULATORY_CARE_PROVIDER_SITE_OTHER): Payer: 59 | Admitting: Internal Medicine

## 2016-10-05 ENCOUNTER — Ambulatory Visit (INDEPENDENT_AMBULATORY_CARE_PROVIDER_SITE_OTHER): Payer: Self-pay | Admitting: Orthopaedic Surgery

## 2016-10-05 ENCOUNTER — Ambulatory Visit (HOSPITAL_BASED_OUTPATIENT_CLINIC_OR_DEPARTMENT_OTHER)
Admission: RE | Admit: 2016-10-05 | Discharge: 2016-10-05 | Disposition: A | Discharge status: Home / Self Care | Payer: 59 | Source: Ambulatory Visit | Attending: Internal Medicine | Admitting: Internal Medicine

## 2016-10-05 VITALS — BP 118/66 | HR 83 | Temp 97.3°F | Resp 14 | Ht 70.0 in | Wt 185.0 lb

## 2016-10-05 DIAGNOSIS — M9902 Segmental and somatic dysfunction of thoracic region: Secondary | ICD-10-CM | POA: Diagnosis not present

## 2016-10-05 DIAGNOSIS — M542 Cervicalgia: Secondary | ICD-10-CM | POA: Diagnosis not present

## 2016-10-05 DIAGNOSIS — M4712 Other spondylosis with myelopathy, cervical region: Secondary | ICD-10-CM | POA: Diagnosis not present

## 2016-10-05 DIAGNOSIS — M13171 Monoarthritis, not elsewhere classified, right ankle and foot: Secondary | ICD-10-CM

## 2016-10-05 DIAGNOSIS — M7989 Other specified soft tissue disorders: Secondary | ICD-10-CM | POA: Diagnosis not present

## 2016-10-05 DIAGNOSIS — M25571 Pain in right ankle and joints of right foot: Secondary | ICD-10-CM | POA: Insufficient documentation

## 2016-10-05 DIAGNOSIS — M9901 Segmental and somatic dysfunction of cervical region: Secondary | ICD-10-CM | POA: Diagnosis not present

## 2016-10-05 HISTORY — DX: Pain in right ankle and joints of right foot: M25.571

## 2016-10-05 LAB — CBC WITH DIFFERENTIAL/PLATELET
Basophils Absolute: 0 10*3/uL (ref 0.0–0.1)
Basophils Relative: 0.4 % (ref 0.0–3.0)
Eosinophils Absolute: 0.1 10*3/uL (ref 0.0–0.7)
Eosinophils Relative: 1.8 % (ref 0.0–5.0)
HCT: 38 % — ABNORMAL LOW (ref 39.0–52.0)
Hemoglobin: 12.7 g/dL — ABNORMAL LOW (ref 13.0–17.0)
Lymphocytes Relative: 27.4 % (ref 12.0–46.0)
Lymphs Abs: 1.7 10*3/uL (ref 0.7–4.0)
MCHC: 33.5 g/dL (ref 30.0–36.0)
MCV: 97 fl (ref 78.0–100.0)
Monocytes Absolute: 0.5 10*3/uL (ref 0.1–1.0)
Monocytes Relative: 8.5 % (ref 3.0–12.0)
Neutro Abs: 3.9 10*3/uL (ref 1.4–7.7)
Neutrophils Relative %: 61.9 % (ref 43.0–77.0)
Platelets: 247 10*3/uL (ref 150.0–400.0)
RBC: 3.91 Mil/uL — ABNORMAL LOW (ref 4.22–5.81)
RDW: 15.1 % (ref 11.5–15.5)
WBC: 6.4 10*3/uL (ref 4.0–10.5)

## 2016-10-05 LAB — COMPREHENSIVE METABOLIC PANEL
ALT: 16 U/L (ref 0–53)
AST: 14 U/L (ref 0–37)
Albumin: 4.3 g/dL (ref 3.5–5.2)
Alkaline Phosphatase: 67 U/L (ref 39–117)
BUN: 15 mg/dL (ref 6–23)
CO2: 27 mEq/L (ref 19–32)
Calcium: 9.5 mg/dL (ref 8.4–10.5)
Chloride: 105 mEq/L (ref 96–112)
Creatinine, Ser: 0.79 mg/dL (ref 0.40–1.50)
GFR: 105.16 mL/min (ref >60.00)
Glucose, Bld: 194 mg/dL — ABNORMAL HIGH (ref 70–99)
Potassium: 3.8 mEq/L (ref 3.5–5.1)
Sodium: 140 mEq/L (ref 135–145)
Total Bilirubin: 0.4 mg/dL (ref 0.2–1.2)
Total Protein: 7.3 g/dL (ref 6.0–8.3)

## 2016-10-05 LAB — URIC ACID: Uric Acid, Serum: 4 mg/dL (ref 4.0–7.8)

## 2016-10-05 LAB — SEDIMENTATION RATE: Sed Rate: 27 mm/hr — ABNORMAL HIGH (ref 0–20)

## 2016-10-05 MED ORDER — PREDNISONE 10 MG (21) PO TBPK: ORAL_TABLET | ORAL | 0 refills | Status: DC

## 2016-10-05 MED ORDER — MELOXICAM 7.5 MG PO TABS: 15.0000 mg | ORAL_TABLET | Freq: Every day | ORAL | 2 refills | Status: DC | PRN

## 2016-10-05 MED FILL — TESTOSTERONE 50 MG/5 GRAM G: 50 MG/5GM | 15 days supply | Qty: 150 | Fill #2

## 2016-10-05 MED FILL — predniSONE 10 MG TABS: 10 | 6 days supply | Qty: 21 | Fill #0

## 2016-10-05 MED FILL — MELOXICAM 7.5 MG TABLET: 7.5 | 15 days supply | Qty: 30 | Fill #0

## 2016-10-05 NOTE — Patient Instructions (Addendum)
GO TO THE LAB : Get the blood work     STOP BY THE FIRST FLOOR:  get the XR   Use the crutches consistently  ICE, rest, leg elevation.    IBUPROFEN (Advil or Motrin) 200 mg 2 tablets every 8 hours as needed for pain.  Always take it with food because may cause gastritis and ulcers.  If you notice nausea, stomach pain, change in the color of stools --->  Stop the medicine and let us know   Call if pain is severe, fever or chills

## 2016-10-05 NOTE — Progress Notes (Signed)
Subjective:    Patient ID: Reginald Rios, male    DOB: 06-10-1955, 61 y.o.   MRN: [id number]  DOS:  10/05/2016 Type of visit - description : acute Interval history: Patient developed pain and swelling at the inner aspect of the right ankle about 4 days ago. Symptoms get  worse as the day go by. He is limping however he persisted working, he is a Armed forces training and education officer. Has been using ice, Advil with no much help.   Review of Systems Denies any injury. No fever chills. Did report overuse in the last few weeks, he has been more active as a tennis pro. Has a history of Achilles tendo surgery n complicated by infection but that was several years ago. No actual calf pain or swelling.  Past Medical History:  Diagnosis Date  . ADD (attention deficit disorder)   . Anemia   . Arthritis   . Diabetes mellitus without complication ([place])   . Exercise-induced asthma   . Heart murmur   . Hyperlipidemia    family hx of high cholesterol  . Hypogonadism in male   . Low back pain   . Osteoarthritis     Past Surgical History:  Procedure Laterality Date  . KNEE ARTHROSCOPY    . TENOTOMY ACHILLES TENDON      Social History   Social History  . Marital status: Married    Spouse name: N/A  . Number of children: N/A  . Years of education: N/A   Occupational History  . Not on file.   Social History Main Topics  . Smoking status: Never Smoker  . Smokeless tobacco: Never Used  . Alcohol use 0.0 oz/week     Comment: rare  . Drug use: No  . Sexual activity: Not on file   Other Topics Concern  . Not on file   Social History Narrative   Previously worked as Geophysicist/field seismologist   Currently working at [employer]   Married 30 years   2 children ages 16, 41   Wife is psychiatrist - Dr. Margurite Auerbach   Moved from [place] in [place] as of 10/05/2016      Reactions   Invokana [canagliflozin] Rash   Statins Other (See Comments), Rash   Muscle aches  Muscle aches       Medication List       Accurate as of 10/05/16  7:22 PM. Always use your most recent med list.          gabapentin 100 MG capsule Commonly known as:  NEURONTIN Take 1 capsule (100 mg total) by mouth at bedtime.   JANUMET 50-1000 MG tablet Generic drug:  sitaGLIPtin-metformin TAKE 1 TABLET BY MOUTH TWICE DAILY WITH A MEAL   meloxicam 7.5 MG tablet Commonly known as:  MOBIC Take 2 tablets (15 mg total) by mouth daily as needed for pain.   methylphenidate 20 MG tablet Commonly known as:  RITALIN Take 20 mg by mouth daily.   montelukast 10 MG tablet Commonly known as:  SINGULAIR Take 1 tablet (10 mg total) by mouth daily.   nateglinide 120 MG tablet Commonly known as:  STARLIX Take 1 tablet (120 mg total) by mouth daily.   pravastatin 10 MG tablet Commonly known as:  PRAVACHOL Take 1 tablet (10 mg total) by mouth every evening.   predniSONE 10 MG (21) Tbpk tablet Commonly known as:  STERAPRED UNI-PAK 21 TAB Take as directed   tadalafil  10 MG tablet Commonly known as:  CIALIS Take 1 tablet (10 mg total) by mouth daily as needed for erectile dysfunction.   testosterone 50 MG/5GM (1%) Gel Commonly known as:  ANDROGEL APPLY 5 GRAMS (1 TUBE) TO EACH UPPER ARM AREA EVERY MORNING   valsartan 80 MG tablet Commonly known as:  DIOVAN Take 1 tablet (80 mg total) by mouth daily.   VENTOLIN HFA 108 (90 Base) MCG/ACT inhaler Generic drug:  albuterol Inhale 1 puff into the lungs every 6 (six) hours as needed (cough).          Objective:   Physical Exam BP 118/66 (BP Location: Left Arm, Patient Position: Sitting, Cuff Size: Normal)   Pulse 83   Temp 97.3 F (36.3 C) (Oral)   Resp 14   Ht 5\' 10"  (1.778 m)   Wt 185 lb (83.9 kg)   SpO2 98%   BMI 26.54 kg/m  General:   Well developed, well nourished . NAD.  HEENT:  Normocephalic . Face symmetric, atraumatic Lungs:  CTA B Normal respiratory effort, no intercostal retractions, no accessory muscle use. Heart: RRR,   no murmur.  No pretibial edema bilaterally  Skin: Not pale. Not jaundice MSK: Left foot and ankle normal Right foot and ankle: Ankle range of motion slightly decreased, + swelling, warmness and the inner malleolar area, minimal swelling at the rest of the ankle. Not really TTP on the area. R foot is actually normal. Neurologic:  alert & oriented X3.  Speech normal, gait : Limping due to pain. Crutches provided. Used them  properly. Psych--  Cognition and judgment appear intact.  Cooperative with normal attention span and concentration.  Behavior appropriate. No anxious or depressed appearing.      Assessment & Plan:   61 year old gentleman with history of diabetes, nonischemic cardiomyopathy, ADD, hyperlipidemia, DJD, hypogonadism on HRT, presents with Monarthritis: Pain, swelling and warmness of the right ankle, acute without injury but with overuse. DDX overuse w/ DJD flare up, gout, others such as infection. Plan: CMP, CBC, uric acid, sedimentation rate, x-ray, ortho referral for today.. Also rest, ice and judicious use of Advil, GI precautions discussed. See instructions.

## 2016-10-05 NOTE — Progress Notes (Signed)
Office Visit Note   Patient: Reginald Rios           Date of Birth: [birth year]           MRN: [id number] Visit Date: 10/05/2016              Requested by: Vernie Shanks, MD [place], [place] [address] PCP: Vernie Shanks, MD   Assessment & Plan: Visit Diagnoses:  1. Pain in right ankle and joints of right foot     Plan: Overall impression is right posterior tibial tendinitis. Recommend out of tennis for 2 weeks. Cam walker was given. Prednisone and meloxicam prescribed. Follow-up in 2 weeks for recheck. Uric acid was normal and white count was normal that was drawn his PCP earlier today.  Follow-Up Instructions: Return in about 2 weeks (around 10/19/2016).   Orders:  No orders of the defined types were placed in this encounter.  Meds ordered this encounter  Medications  . predniSONE (STERAPRED UNI-PAK 21 TAB) 10 MG (21) TBPK tablet    Sig: Take as directed    Dispense:  21 tablet    Refill:  0  . meloxicam (MOBIC) 7.5 MG tablet    Sig: Take 2 tablets (15 mg total) by mouth daily as needed for pain.    Dispense:  30 tablet    Refill:  2      Procedures: No procedures performed   Clinical Data: No additional findings.   Subjective: Chief Complaint  Patient presents with  . Right Ankle - Pain    Patient is a 61 year old, who comes in today for 3-4 day history of worsening right medial ankle pain. He was seen by his PCP per day. 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 He did play singles over the weekend. He denies any constitutional symptoms. Pain radiates up into the calf.    Review of Systems  Constitutional: Negative.   All other systems reviewed and are negative.    Objective: Vital Signs: There were no vitals taken for this visit.  Physical Exam  Constitutional: He is oriented to person, place, and time. He appears well-developed and well-nourished.  HENT:  Head: Normocephalic and atraumatic.  Eyes: Pupils are  equal, round, and reactive to light.  Neck: Neck supple.  Pulmonary/Chest: Effort normal.  Abdominal: Soft.  Musculoskeletal: Normal range of motion.  Neurological: He is alert and oriented to person, place, and time.  Skin: Skin is warm.  Psychiatric: He has a normal mood and affect. His behavior is normal. Judgment and thought content normal.  Nursing note and vitals reviewed.   Ortho Exam Right ankle exam shows warmth and swelling of the posterior medial aspect of the ankle. This is very tender to palpation. There is slight erythema. There is no soft tissue crepitus. He has a postsurgical scar. Specialty Comments:  No specialty comments available.  Imaging: Dg Ankle Complete Right  Result Date: 10/05/2016 CLINICAL DATA:  Right ankle pain and swelling . EXAM: RIGHT ANKLE - COMPLETE 3+ VIEW COMPARISON:  No recent prior. FINDINGS: Soft tissue swelling is noted. No evidence of acute fracture or dislocation. Corticated bony density noted adjacent to the lateral malleolus. This is consistent with an old fracture fragment. IMPRESSION: 1.  Soft tissue swelling noted of the medial malleolus. 2. Diffuse degenerative change. Corticated bony density noted adjacent to the lateral malleolus consistent with old fracture fragment. No acute fracture identified. Electronically Signed   By: Marcello Moores  Register   On: 10/05/2016 10:38     PMFS History: Patient Active Problem List   Diagnosis Date Noted  . Pain in right ankle and joints of right foot 10/05/2016  . Hemarthrosis of right elbow 02/03/2016  . Small thenar eminence 02/03/2016  . Cervical disc disorder with radiculopathy of cervical region 10/07/2015  . Incomplete rotator cuff tear 09/10/2015  . Nonischemic cardiomyopathy ([place]) 05/15/2015  . Fatigue due to depression 05/15/2015  . Dyspnea on exertion 05/15/2015  . Decreased cardiac ejection fraction 04/02/2015  . Strain of latissimus dorsi muscle 01/21/2015  . Shoulder bursitis 10/12/2014  .  Stenosis of lateral recess of lumbar spine 10/12/2014  . BPPV (benign paroxysmal positional vertigo) 08/14/2014  . Hyperlipidemia 01/19/2014  . Anemia, unspecified 01/19/2014  . ADD (attention deficit disorder) 01/19/2014  . Diabetes type 2, uncontrolled ([place]) 12/22/2013  . Hypogonadism in male 12/22/2013  . Arthritis of hand, left 12/22/2013   Past Medical History:  Diagnosis Date  . ADD (attention deficit disorder)   . Anemia   . Arthritis   . Diabetes mellitus without complication ([place])   . Exercise-induced asthma   . Heart murmur   . Hyperlipidemia    family hx of high cholesterol  . Hypogonadism in male   . Low back pain   . Osteoarthritis     Family History  Problem Relation Age of Onset  . Lung cancer Mother 48  . Coronary artery disease Father 104  . Hypothyroidism Brother   . Colon cancer Neg Hx   . Prostate cancer Neg Hx     Past Surgical History:  Procedure Laterality Date  . KNEE ARTHROSCOPY    . TENOTOMY ACHILLES TENDON     Social History   Occupational History  . Not on file.   Social History Main Topics  . Smoking status: Never Smoker  . Smokeless tobacco: Never Used  . Alcohol use 0.0 oz/week     Comment: rare  . Drug use: No  . Sexual activity: Not on file

## 2016-10-05 NOTE — Progress Notes (Signed)
Pre visit review using our clinic review tool, if applicable. No additional management support is needed unless otherwise documented below in the visit note. 

## 2016-10-08 ENCOUNTER — Ambulatory Visit: Payer: 59 | Attending: Neurological Surgery | Admitting: Physical Therapy

## 2016-10-08 DIAGNOSIS — M545 Low back pain: Secondary | ICD-10-CM | POA: Diagnosis not present

## 2016-10-08 DIAGNOSIS — R293 Abnormal posture: Secondary | ICD-10-CM

## 2016-10-08 DIAGNOSIS — M542 Cervicalgia: Secondary | ICD-10-CM

## 2016-10-08 NOTE — Therapy (Signed)
[facility] [address]  [place], [place], [address] Phone: [phone]   Fax:  [phone]  Physical Therapy Treatment  Patient Details  Name: Reginald Rios MRN: [id number] Date of Birth: December 19, 1955 Referring Provider: Dr. Kristeen Miss  Encounter Date: 10/08/2016      PT End of Session - 10/08/16 0802    Visit Number 2   Number of Visits 12   Date for PT Re-Evaluation 11/12/16   PT Start Time 0759   PT Stop Time 0847   PT Time Calculation (min) 48 min   Activity Tolerance Patient tolerated treatment well   Behavior During Therapy [facility] for tasks assessed/performed      Past Medical History:  Diagnosis Date  . ADD (attention deficit disorder)   . Anemia   . Arthritis   . Diabetes mellitus without complication ([place])   . Exercise-induced asthma   . Heart murmur   . Hyperlipidemia    family hx of high cholesterol  . Hypogonadism in male   . Low back pain   . Osteoarthritis     Past Surgical History:  Procedure Laterality Date  . KNEE ARTHROSCOPY    . TENOTOMY ACHILLES TENDON      There were no vitals filed for this visit.      Subjective Assessment - 10/08/16 0800    Subjective Felt well after first visit - went and played tennis - feels like he injured his ankle - wearing CAM boot   Pertinent History Cervical fusion   Patient Stated Goals reduce pain, improve quality of life   Currently in Pain? Yes   Pain Score 1    Pain Location Neck   Pain Orientation Posterior   Pain Descriptors / Indicators Tightness   Pain Type Chronic pain                         [facility] Adult PT Treatment/Exercise - 10/08/16 0001      Neck Exercises: Machines for Strengthening   UBE (Upper Arm Bike) L2 x 6 minutes (3/3)     Lumbar Exercises: Stretches   Passive Hamstring Stretch 3 reps;30 seconds   Passive Hamstring Stretch Limitations B; supine with strap     Lumbar Exercises: Standing    Other Standing Lumbar Exercises paloff press - single green tband x 15 reps each side     Lumbar Exercises: Supine   Ab Set 10 reps;5 seconds   AB Set Limitations heavy VC and TC   Isometric Hip Flexion 15 reps;5 seconds   Isometric Hip Flexion Limitations some hip "cramping"     Lumbar Exercises: Sidelying   Other Sidelying Lumbar Exercises open book 5 x 10 sec each side     Manual Therapy   Manual Therapy Soft tissue mobilization;Myofascial release   Manual therapy comments patient supine   Soft tissue mobilization deep STM to B UT, B paraspinals, B LS   Myofascial Release manual trigger point release to B UT                     PT Long Term Goals - 10/08/16 0802      PT LONG TERM GOAL #1   Title Patient to be independent with advanced HEP (11/12/16)   Status On-going     PT LONG TERM GOAL #2   Title Patient to improve cervical and lumbar AROM to WNL and symmetrical without pain limiting (11/12/17)  Status On-going     PT LONG TERM GOAL #3   Title Patient to perform work and leisure related activities (tennis) without increased pain at either cervical or lumbar spine (11/12/17)   Status On-going     PT LONG TERM GOAL #4   Title Patient to report ability to jog for short distances without pain limiting (11/12/17)   Status On-going               Plan - 10/08/16 0803    Clinical Impression Statement Patient doing well today - little to no pain, however has since injured ankle since last treatment, of which he has been seen by ortho and given CAM boot. Patient with continued tightness through upper neck/back musculature - with good response to STM and manual trigger point release. PT session today focusing on low back as patient states this is most troublesome today.    PT Treatment/Interventions ADLs/Self Care Home Management;Cryotherapy;Electrical Stimulation;Moist Heat;Traction;Ultrasound;Neuromuscular re-education;Balance training;Therapeutic  exercise;Therapeutic activities;Functional mobility training;Patient/family education;Manual techniques;Passive range of motion;Vasopneumatic Device;Taping;Dry needling   Consulted and Agree with Plan of Care Patient      Patient will benefit from skilled therapeutic intervention in order to improve the following deficits and impairments:  Decreased activity tolerance, Decreased range of motion, Decreased mobility, Decreased strength, Difficulty walking, Increased muscle spasms, Impaired UE functional use, Pain  Visit Diagnosis: Cervicalgia  Bilateral low back pain, unspecified chronicity, with sciatica presence unspecified  Abnormal posture     Problem List Patient Active Problem List   Diagnosis Date Noted  . Pain in right ankle and joints of right foot 10/05/2016  . Hemarthrosis of right elbow 02/03/2016  . Small thenar eminence 02/03/2016  . Cervical disc disorder with radiculopathy of cervical region 10/07/2015  . Incomplete rotator cuff tear 09/10/2015  . Nonischemic cardiomyopathy ([place]) 05/15/2015  . Fatigue due to depression 05/15/2015  . Dyspnea on exertion 05/15/2015  . Decreased cardiac ejection fraction 04/02/2015  . Strain of latissimus dorsi muscle 01/21/2015  . Shoulder bursitis 10/12/2014  . Stenosis of lateral recess of lumbar spine 10/12/2014  . BPPV (benign paroxysmal positional vertigo) 08/14/2014  . Hyperlipidemia 01/19/2014  . Anemia, unspecified 01/19/2014  . ADD (attention deficit disorder) 01/19/2014  . Diabetes type 2, uncontrolled ([place]) 12/22/2013  . Hypogonadism in male 12/22/2013  . Arthritis of hand, left 12/22/2013    Lanney Gins, PT, DPT 10/08/16 11:57 AM   [facility] [address]  [place], [place], [address] Phone: [phone]   Fax:  [phone]  Name: HELMER DULL MRN: [id number] Date of Birth: Sep 15, 1955

## 2016-10-08 NOTE — Patient Instructions (Signed)
Pelvic Tilt: Posterior - Legs Bent (Supine)   Tighten stomach and flatten back by rolling pelvis down. Hold __5-10__ seconds. Relax. Repeat __15__ times per set. Do __2__ sets per session.    Bilateral Isometric Hip Flexion   Tighten stomach and raise both knees to outstretched arms. Push gently, keeping arms straight, trunk rigid. Hold __5__ seconds. Repeat __15__ times per set. Do __2__ sets per session.    Open Book   Lying on side, with knees bent and arms out straight, Rotate and open 10 x 10 seconds each side   Paloff press   Knees slightly bent, engage core and keep trunk upright, Push band and bring back to chest x 15 each side

## 2016-10-13 DIAGNOSIS — M542 Cervicalgia: Secondary | ICD-10-CM | POA: Diagnosis not present

## 2016-10-13 DIAGNOSIS — M4712 Other spondylosis with myelopathy, cervical region: Secondary | ICD-10-CM | POA: Diagnosis not present

## 2016-10-13 DIAGNOSIS — M9901 Segmental and somatic dysfunction of cervical region: Secondary | ICD-10-CM | POA: Diagnosis not present

## 2016-10-13 DIAGNOSIS — M9902 Segmental and somatic dysfunction of thoracic region: Secondary | ICD-10-CM | POA: Diagnosis not present

## 2016-10-15 ENCOUNTER — Ambulatory Visit: Payer: 59 | Admitting: Physical Therapy

## 2016-10-15 DIAGNOSIS — M542 Cervicalgia: Secondary | ICD-10-CM | POA: Diagnosis not present

## 2016-10-15 DIAGNOSIS — M9981 Other biomechanical lesions of cervical region: Secondary | ICD-10-CM | POA: Diagnosis not present

## 2016-10-15 DIAGNOSIS — M545 Low back pain: Secondary | ICD-10-CM

## 2016-10-15 DIAGNOSIS — M4317 Spondylolisthesis, lumbosacral region: Secondary | ICD-10-CM | POA: Diagnosis not present

## 2016-10-15 DIAGNOSIS — R293 Abnormal posture: Secondary | ICD-10-CM

## 2016-10-15 NOTE — Patient Instructions (Addendum)
Trigger Point Dry Needling  . What is Trigger Point Dry Needling (DN)? o DN is a physical therapy technique used to treat muscle pain and dysfunction. Specifically, DN helps deactivate muscle trigger points (muscle knots).  o A thin filiform needle is used to penetrate the skin and stimulate the underlying trigger point. The goal is for a local twitch response (LTR) to occur and for the trigger point to relax. No medication of any kind is injected during the procedure.   . What Does Trigger Point Dry Needling Feel Like?  o The procedure feels different for each individual patient. Some patients report that they do not actually feel the needle enter the skin and overall the process is not painful. Very mild bleeding may occur. However, many patients feel a deep cramping in the muscle in which the needle was inserted. This is the local twitch response.   Marland Kitchen How Will I feel after the treatment? o Soreness is normal, and the onset of soreness may not occur for a few hours. Typically this soreness does not last longer than two days.  o Bruising is uncommon, however; ice can be used to decrease any possible bruising.  o In rare cases feeling tired or nauseous after the treatment is normal. In addition, your symptoms may get worse before they get better, this period will typically not last longer than 24 hours.   . What Can I do After My Treatment? o Increase your hydration by drinking more water for the next 24 hours. o You may place ice or heat on the areas treated that have become sore, however, do not use heat on inflamed or bruised areas. Heat often brings more relief post needling. o You can continue your regular activities, but vigorous activity is not recommended initially after the treatment for 24 hours. o DN is best combined with other physical therapy such as strengthening, stretching, and other therapies.     Resisted Horizontal Abduction: Bilateral    Lying on foam roller - pull band  across chest with scap squeeze  External Rotation (Eccentric), (Resistance Band)    lying on foam roller - elbows by side - rotate outward and squeeze scap

## 2016-10-15 NOTE — Therapy (Signed)
[facility] [address]  [place], [place], [address] Phone: [phone]   Fax:  [phone]  Physical Therapy Treatment  Patient Details  Name: Reginald Rios MRN: [id number] Date of Birth: 1956/04/01 Referring Provider: Dr. Kristeen Miss  Encounter Date: 10/15/2016      PT End of Session - 10/15/16 0753    Visit Number 3   Number of Visits 12   Date for PT Re-Evaluation 11/12/16   PT Start Time 0750   PT Stop Time 0835   PT Time Calculation (min) 45 min   Activity Tolerance Patient tolerated treatment well   Behavior During Therapy [facility] for tasks assessed/performed      Past Medical History:  Diagnosis Date  . ADD (attention deficit disorder)   . Anemia   . Arthritis   . Diabetes mellitus without complication ([place])   . Exercise-induced asthma   . Heart murmur   . Hyperlipidemia    family hx of high cholesterol  . Hypogonadism in male   . Low back pain   . Osteoarthritis     Past Surgical History:  Procedure Laterality Date  . KNEE ARTHROSCOPY    . TENOTOMY ACHILLES TENDON      There were no vitals filed for this visit.      Subjective Assessment - 10/15/16 0751    Subjective Feels well in regards to neck and back - only a slight twinge this morning. Ankle is really bothering him - sees Dr. Erlinda Hong next Wednesday   Pertinent History Cervical fusion   Patient Stated Goals reduce pain, improve quality of life   Currently in Pain? Yes   Pain Location Neck   Pain Orientation Right   Pain Descriptors / Indicators Tightness                         [facility] Adult PT Treatment/Exercise - 10/15/16 0001      Neck Exercises: Machines for Strengthening   UBE (Upper Arm Bike) L4 x 6 minutes (3/3)     Neck Exercises: Theraband   Horizontal ABduction 15 reps;Red   Horizontal ABduction Limitations hooklying on 1/2 foam roller   Other Theraband Exercises ER - 15 reps; red tband - hooklying  on 1/2 foam roller   Other Theraband Exercises flexion diagonals - red tband x 15 reps - hooklying on 1/2 foam roller     Lumbar Exercises: Supine   Isometric Hip Flexion 15 reps;5 seconds   Isometric Hip Flexion Limitations some hip "cramping" - use of ball to reduce cramps     Neck Exercises: Stretches   Other Neck Stretches pec stretch - hooklying on 1/2 foam roller                     PT Long Term Goals - 10/08/16 0802      PT LONG TERM GOAL #1   Title Patient to be independent with advanced HEP (11/12/16)   Status On-going     PT LONG TERM GOAL #2   Title Patient to improve cervical and lumbar AROM to WNL and symmetrical without pain limiting (11/12/17)   Status On-going     PT LONG TERM GOAL #3   Title Patient to perform work and leisure related activities (tennis) without increased pain at either cervical or lumbar spine (11/12/17)   Status On-going     PT LONG TERM GOAL #4   Title Patient to  report ability to jog for short distances without pain limiting (11/12/17)   Status On-going               Plan - 10/15/16 0753    Clinical Impression Statement Patient reporting minimal pain in neck and low back today - primary issues of R ankle pain - feels like when he wear CAM boot, pain is more pronounced in back, therefore trying to walk as little as possible with CAM. Discussion today on DN with patient willing to possibly begin this treatment at next session. Patient following up with MD on 6/19 regarding ankle - may be requesting PT orders from MD regarding ankle.    PT Treatment/Interventions ADLs/Self Care Home Management;Cryotherapy;Electrical Stimulation;Moist Heat;Traction;Ultrasound;Neuromuscular re-education;Balance training;Therapeutic exercise;Therapeutic activities;Functional mobility training;Patient/family education;Manual techniques;Passive range of motion;Vasopneumatic Device;Taping;Dry needling   Consulted and Agree with Plan of Care Patient       Patient will benefit from skilled therapeutic intervention in order to improve the following deficits and impairments:  Decreased activity tolerance, Decreased range of motion, Decreased mobility, Decreased strength, Difficulty walking, Increased muscle spasms, Impaired UE functional use, Pain  Visit Diagnosis: Cervicalgia  Bilateral low back pain, unspecified chronicity, with sciatica presence unspecified  Abnormal posture     Problem List Patient Active Problem List   Diagnosis Date Noted  . Pain in right ankle and joints of right foot 10/05/2016  . Hemarthrosis of right elbow 02/03/2016  . Small thenar eminence 02/03/2016  . Cervical disc disorder with radiculopathy of cervical region 10/07/2015  . Incomplete rotator cuff tear 09/10/2015  . Nonischemic cardiomyopathy ([place]) 05/15/2015  . Fatigue due to depression 05/15/2015  . Dyspnea on exertion 05/15/2015  . Decreased cardiac ejection fraction 04/02/2015  . Strain of latissimus dorsi muscle 01/21/2015  . Shoulder bursitis 10/12/2014  . Stenosis of lateral recess of lumbar spine 10/12/2014  . BPPV (benign paroxysmal positional vertigo) 08/14/2014  . Hyperlipidemia 01/19/2014  . Anemia, unspecified 01/19/2014  . ADD (attention deficit disorder) 01/19/2014  . Diabetes type 2, uncontrolled ([place]) 12/22/2013  . Hypogonadism in male 12/22/2013  . Arthritis of hand, left 12/22/2013     Lanney Gins, PT, DPT 10/15/16 8:44 AM   [facility] [address]  [place], [place], [address] Phone: [phone]   Fax:  [phone]  Name: BOHDAN MACHO MRN: [id number] Date of Birth: [birth year]

## 2016-10-19 ENCOUNTER — Ambulatory Visit (INDEPENDENT_AMBULATORY_CARE_PROVIDER_SITE_OTHER): Payer: 59 | Admitting: Orthopaedic Surgery

## 2016-10-19 MED FILL — TESTOSTERONE 50 MG/5GM (1%): 50 MG/5GM | 15 days supply | Qty: 150 | Fill #3

## 2016-10-20 ENCOUNTER — Ambulatory Visit (INDEPENDENT_AMBULATORY_CARE_PROVIDER_SITE_OTHER): Payer: 59 | Admitting: Orthopaedic Surgery

## 2016-10-21 ENCOUNTER — Ambulatory Visit: Payer: 59 | Admitting: Physical Therapy

## 2016-10-21 DIAGNOSIS — M542 Cervicalgia: Secondary | ICD-10-CM | POA: Diagnosis not present

## 2016-10-21 DIAGNOSIS — M545 Low back pain: Secondary | ICD-10-CM | POA: Diagnosis not present

## 2016-10-21 DIAGNOSIS — R293 Abnormal posture: Secondary | ICD-10-CM

## 2016-10-21 MED FILL — VALSARTAN 80 MG TABLET: 80 | 30 days supply | Qty: 30 | Fill #2

## 2016-10-21 NOTE — Patient Instructions (Signed)

## 2016-10-21 NOTE — Therapy (Addendum)
[facility] [address]  [place], [place], [address] Phone: [phone]   Fax:  [phone]  Physical Therapy Treatment  Patient Details  Name: Reginald Rios MRN: [id number] Date of Birth: Jan 05, 1956 Referring Provider: Dr. Kristeen Miss  Encounter Date: 10/21/2016      PT End of Session - 10/21/16 1314    Visit Number 4   Number of Visits 12   Date for PT Re-Evaluation 11/12/16   PT Start Time 1310   PT Stop Time 1356   PT Time Calculation (min) 46 min   Activity Tolerance Patient tolerated treatment well   Behavior During Therapy [facility] for tasks assessed/performed      Past Medical History:  Diagnosis Date  . ADD (attention deficit disorder)   . Anemia   . Arthritis   . Diabetes mellitus without complication ([place])   . Exercise-induced asthma   . Heart murmur   . Hyperlipidemia    family hx of high cholesterol  . Hypogonadism in male   . Low back pain   . Osteoarthritis     Past Surgical History:  Procedure Laterality Date  . KNEE ARTHROSCOPY    . TENOTOMY ACHILLES TENDON      There were no vitals filed for this visit.      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 Yes   Pain Score 3    Pain Location Back   Pain Orientation Right;Lower   Pain Descriptors / Indicators Aching;Sore   Pain Type Chronic pain                         [facility] Adult PT Treatment/Exercise - 10/21/16 1342      Neck Exercises: Machines for Strengthening   UBE (Upper Arm Bike) L4 x 5 min     Lumbar Exercises: Stretches   Passive Hamstring Stretch 3 reps;30 seconds   Passive Hamstring Stretch Limitations B; supine with strap     Lumbar Exercises: Standing   Other  Standing Lumbar Exercises paloff press - single green tband x 15 reps each side     Lumbar Exercises: Supine   Bridge 15 reps;5 seconds   Bridge Limitations green tband at knees   Isometric Hip Flexion 10 reps;5 seconds     Manual Therapy   Manual Therapy Soft tissue mobilization;Joint mobilization   Manual therapy comments patient prone   Joint Mobilization Grade II-III CPAs to L-spine   Soft tissue mobilization STM to B lumbar paraspinals          Trigger Point Dry Needling - 10/21/16 1340    Consent Given? Yes   Education Handout Provided Yes   Muscles Treated Lower Body --  R L2-3, L3-4, L4-5 multifidi, R iliocostalis lumborum                   PT Long Term Goals - 10/08/16 0802      PT LONG TERM GOAL #1   Title Patient to be independent with advanced HEP (11/12/16)   Status On-going     PT LONG TERM GOAL #2   Title Patient to improve cervical and lumbar AROM to WNL and symmetrical without  pain limiting (11/12/17)   Status On-going     PT LONG TERM GOAL #3   Title Patient to perform work and leisure related activities (tennis) without increased pain at either cervical or lumbar spine (11/12/17)   Status On-going     PT LONG TERM GOAL #4   Title Patient to report ability to jog for short distances without pain limiting (11/12/17)   Status On-going               Plan - 10/21/16 1556    Clinical Impression Statement Patient today with some increased back pain today - of which he 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 PT and patient discussing DN treatment with patient wanting to try thi avenue. Educational handout provided with patient understandable to process of treatment and willing to continue with treatment. DN to R lumbar multifidi and paraspinals with patient verbalizing improved symptoms. Core stabilization work continued today with good tolerance.    PT Treatment/Interventions ADLs/Self Care Home Management;Cryotherapy;Electrical  Stimulation;Moist Heat;Traction;Ultrasound;Neuromuscular re-education;Balance training;Therapeutic exercise;Therapeutic activities;Functional mobility training;Patient/family education;Manual techniques;Passive range of motion;Vasopneumatic Device;Taping;Dry needling   Consulted and Agree with Plan of Care Patient      Patient will benefit from skilled therapeutic intervention in order to improve the following deficits and impairments:  Decreased activity tolerance, Decreased range of motion, Decreased mobility, Decreased strength, Difficulty walking, Increased muscle spasms, Impaired UE functional use, Pain  Visit Diagnosis: Cervicalgia  Bilateral low back pain, unspecified chronicity, with sciatica presence unspecified  Abnormal posture     Problem List Patient Active Problem List   Diagnosis Date Noted  . Pain in right ankle and joints of right foot 10/05/2016  . Hemarthrosis of right elbow 02/03/2016  . Small thenar eminence 02/03/2016  . Cervical disc disorder with radiculopathy of cervical region 10/07/2015  . Incomplete rotator cuff tear 09/10/2015  . Nonischemic cardiomyopathy ([place]) 05/15/2015  . Fatigue due to depression 05/15/2015  . Dyspnea on exertion 05/15/2015  . Decreased cardiac ejection fraction 04/02/2015  . Strain of latissimus dorsi muscle 01/21/2015  . Shoulder bursitis 10/12/2014  . Stenosis of lateral recess of lumbar spine 10/12/2014  . BPPV (benign paroxysmal positional vertigo) 08/14/2014  . Hyperlipidemia 01/19/2014  . Anemia, unspecified 01/19/2014  . ADD (attention deficit disorder) 01/19/2014  . Diabetes type 2, uncontrolled ([place]) 12/22/2013  . Hypogonadism in male 12/22/2013  . Arthritis of hand, left 12/22/2013   Lanney Gins, PT, DPT 10/21/16 4:04 PM   PHYSICAL THERAPY DISCHARGE SUMMARY  Visits from Start of Care: 4  Current functional level related to goals / functional outcomes: See above; recent foot injury likely contributing  to pain symptoms at low back   Remaining deficits: See above   Education / Equipment: HEP  Plan: Patient agrees to discharge.  Patient goals were not met. Patient is being discharged due to not returning since the last visit.  ?????    Lanney Gins, PT, DPT 11/25/16 8:34 AM    [facility] [address]  [place], [place], [address] Phone: [phone]   Fax:  [phone]  Name: Reginald Rios MRN: [id number] Date of Birth: March 16, 1956

## 2016-10-22 ENCOUNTER — Ambulatory Visit: Payer: 59 | Admitting: Physical Therapy

## 2016-10-23 MED FILL — GABAPENTIN 100 MG CAPSULE: 100 | 90 days supply | Qty: 90 | Fill #0

## 2016-10-23 MED FILL — METHYLPHENIDATE 20 MG TAB: 20 | 30 days supply | Qty: 60 | Fill #0

## 2016-11-05 MED FILL — JANUMET 50-1,000 MG TABLET: 50-1000 | 30 days supply | Qty: 60 | Fill #4

## 2016-11-10 MED FILL — TESTOSTERONE 50 MG/5GM (1%): 50 MG/5GM | 15 days supply | Qty: 150 | Fill #0

## 2016-11-30 MED FILL — TESTOSTERONE 50 MG/5GM (1%): 50 MG/5GM | 15 days supply | Qty: 150 | Fill #1

## 2016-11-30 MED FILL — VALSARTAN 80 MG TABLET: 80 | 30 days supply | Qty: 30 | Fill #3

## 2016-12-07 MED FILL — JANUMET 50-1,000 MG TABLET: 50-1000 | 30 days supply | Qty: 60 | Fill #5

## 2016-12-14 MED FILL — PRAVASTATIN NA 20 MG TAB: 20 | 90 days supply | Qty: 90 | Fill #1

## 2016-12-16 MED FILL — TESTOSTERONE 50 MG/5GM (1%): 50 MG/5GM | 15 days supply | Qty: 150 | Fill #2

## 2016-12-29 MED FILL — METHYLPHENIDATE 20 MG TAB: 20 | 30 days supply | Qty: 60 | Fill #0

## 2016-12-31 MED FILL — TESTOSTERONE 50 MG/5GM (1%): 50 MG/5GM | 15 days supply | Qty: 150 | Fill #3

## 2016-12-31 MED FILL — VALSARTAN 80 MG TABLET: 80 | 30 days supply | Qty: 30 | Fill #4

## 2017-01-05 MED FILL — JANUMET 50-1,000 MG TABLET: 50-1000 | 30 days supply | Qty: 60 | Fill #6

## 2017-01-12 DIAGNOSIS — M4712 Other spondylosis with myelopathy, cervical region: Secondary | ICD-10-CM | POA: Diagnosis not present

## 2017-01-12 DIAGNOSIS — M9902 Segmental and somatic dysfunction of thoracic region: Secondary | ICD-10-CM | POA: Diagnosis not present

## 2017-01-12 DIAGNOSIS — M542 Cervicalgia: Secondary | ICD-10-CM | POA: Diagnosis not present

## 2017-01-12 DIAGNOSIS — M9901 Segmental and somatic dysfunction of cervical region: Secondary | ICD-10-CM | POA: Diagnosis not present

## 2017-01-20 MED FILL — TESTOSTERONE 50 MG/5GM (1%): 50 MG/5GM | 15 days supply | Qty: 150 | Fill #0

## 2017-01-25 MED FILL — GABAPENTIN 100 MG CAPSULE: 100 | 90 days supply | Qty: 90 | Fill #1

## 2017-01-26 DIAGNOSIS — H524 Presbyopia: Secondary | ICD-10-CM | POA: Diagnosis not present

## 2017-01-26 DIAGNOSIS — E113299 Type 2 diabetes mellitus with mild nonproliferative diabetic retinopathy without macular edema, unspecified eye: Secondary | ICD-10-CM | POA: Diagnosis not present

## 2017-01-27 DIAGNOSIS — M542 Cervicalgia: Secondary | ICD-10-CM | POA: Diagnosis not present

## 2017-01-27 DIAGNOSIS — M4712 Other spondylosis with myelopathy, cervical region: Secondary | ICD-10-CM | POA: Diagnosis not present

## 2017-01-27 DIAGNOSIS — M9901 Segmental and somatic dysfunction of cervical region: Secondary | ICD-10-CM | POA: Diagnosis not present

## 2017-01-27 DIAGNOSIS — M9902 Segmental and somatic dysfunction of thoracic region: Secondary | ICD-10-CM | POA: Diagnosis not present

## 2017-01-29 DIAGNOSIS — H35033 Hypertensive retinopathy, bilateral: Secondary | ICD-10-CM | POA: Diagnosis not present

## 2017-01-29 DIAGNOSIS — H25041 Posterior subcapsular polar age-related cataract, right eye: Secondary | ICD-10-CM | POA: Diagnosis not present

## 2017-01-29 DIAGNOSIS — H5213 Myopia, bilateral: Secondary | ICD-10-CM | POA: Diagnosis not present

## 2017-01-29 DIAGNOSIS — H20041 Secondary noninfectious iridocyclitis, right eye: Secondary | ICD-10-CM | POA: Diagnosis not present

## 2017-01-29 DIAGNOSIS — H2513 Age-related nuclear cataract, bilateral: Secondary | ICD-10-CM | POA: Diagnosis not present

## 2017-01-29 DIAGNOSIS — H524 Presbyopia: Secondary | ICD-10-CM | POA: Diagnosis not present

## 2017-01-29 MED FILL — PREDNISOLONE AC 1% EYE DROP: 1 | 25 days supply | Qty: 5 | Fill #0

## 2017-02-02 MED FILL — VALSARTAN 80 MG TABLET: 80 | 30 days supply | Qty: 30 | Fill #5

## 2017-02-04 DIAGNOSIS — H25041 Posterior subcapsular polar age-related cataract, right eye: Secondary | ICD-10-CM | POA: Diagnosis not present

## 2017-02-04 DIAGNOSIS — H20041 Secondary noninfectious iridocyclitis, right eye: Secondary | ICD-10-CM | POA: Diagnosis not present

## 2017-02-04 DIAGNOSIS — E119 Type 2 diabetes mellitus without complications: Secondary | ICD-10-CM | POA: Diagnosis not present

## 2017-02-04 DIAGNOSIS — H43811 Vitreous degeneration, right eye: Secondary | ICD-10-CM | POA: Diagnosis not present

## 2017-02-04 DIAGNOSIS — H2513 Age-related nuclear cataract, bilateral: Secondary | ICD-10-CM | POA: Diagnosis not present

## 2017-02-08 MED FILL — TESTOSTERONE 50 MG/5GM (1%): 50 MG/5GM | 15 days supply | Qty: 150 | Fill #1

## 2017-02-08 MED FILL — JANUMET 50-1,000 MG TABLET: 50-1000 | 30 days supply | Qty: 60 | Fill #0

## 2017-02-16 DIAGNOSIS — Z23 Encounter for immunization: Secondary | ICD-10-CM | POA: Diagnosis not present

## 2017-02-16 DIAGNOSIS — N529 Male erectile dysfunction, unspecified: Secondary | ICD-10-CM | POA: Diagnosis not present

## 2017-02-16 DIAGNOSIS — F988 Other specified behavioral and emotional disorders with onset usually occurring in childhood and adolescence: Secondary | ICD-10-CM | POA: Diagnosis not present

## 2017-02-16 DIAGNOSIS — E291 Testicular hypofunction: Secondary | ICD-10-CM | POA: Diagnosis not present

## 2017-02-16 DIAGNOSIS — E119 Type 2 diabetes mellitus without complications: Secondary | ICD-10-CM | POA: Diagnosis not present

## 2017-02-16 DIAGNOSIS — Z5181 Encounter for therapeutic drug level monitoring: Secondary | ICD-10-CM | POA: Diagnosis not present

## 2017-02-16 DIAGNOSIS — Z7984 Long term (current) use of oral hypoglycemic drugs: Secondary | ICD-10-CM | POA: Diagnosis not present

## 2017-02-16 DIAGNOSIS — G629 Polyneuropathy, unspecified: Secondary | ICD-10-CM | POA: Diagnosis not present

## 2017-02-16 DIAGNOSIS — E7800 Pure hypercholesterolemia, unspecified: Secondary | ICD-10-CM | POA: Diagnosis not present

## 2017-02-26 MED FILL — TESTOSTERONE 50 MG/5GM (1%): 50 MG/5GM | 15 days supply | Qty: 150 | Fill #2

## 2017-03-11 MED FILL — METHYLPHENIDATE 20 MG TAB: 20 | 30 days supply | Qty: 60 | Fill #0

## 2017-03-12 MED FILL — TESTOSTERONE 50 MG/5GM (1%): 50 MG/5GM | 15 days supply | Qty: 150 | Fill #3

## 2017-03-12 MED FILL — VALSARTAN 80 MG TABLET: 80 | 30 days supply | Qty: 30 | Fill #6

## 2017-03-12 MED FILL — JANUMET 50-1,000 MG TABLET: 50-1000 | 30 days supply | Qty: 60 | Fill #1

## 2017-03-16 DIAGNOSIS — H2513 Age-related nuclear cataract, bilateral: Secondary | ICD-10-CM | POA: Diagnosis not present

## 2017-03-16 DIAGNOSIS — H25041 Posterior subcapsular polar age-related cataract, right eye: Secondary | ICD-10-CM | POA: Diagnosis not present

## 2017-03-16 DIAGNOSIS — E119 Type 2 diabetes mellitus without complications: Secondary | ICD-10-CM | POA: Diagnosis not present

## 2017-03-16 DIAGNOSIS — H5213 Myopia, bilateral: Secondary | ICD-10-CM | POA: Diagnosis not present

## 2017-03-16 DIAGNOSIS — H43811 Vitreous degeneration, right eye: Secondary | ICD-10-CM | POA: Diagnosis not present

## 2017-03-16 DIAGNOSIS — H35033 Hypertensive retinopathy, bilateral: Secondary | ICD-10-CM | POA: Diagnosis not present

## 2017-03-16 DIAGNOSIS — H524 Presbyopia: Secondary | ICD-10-CM | POA: Diagnosis not present

## 2017-03-16 DIAGNOSIS — H20041 Secondary noninfectious iridocyclitis, right eye: Secondary | ICD-10-CM | POA: Diagnosis not present

## 2017-03-29 MED FILL — TESTOSTERONE 50 MG/5GM (1%): 50 MG/5GM | 15 days supply | Qty: 150 | Fill #4

## 2017-03-29 MED FILL — PRAVASTATIN NA 20 MG TAB: 20 | 90 days supply | Qty: 90 | Fill #2

## 2017-04-13 MED FILL — JANUMET 50-1,000 MG TABLET: 50-1000 | 30 days supply | Qty: 60 | Fill #2

## 2017-04-14 MED FILL — TESTOSTERONE 50 MG/5GM (1%): 50 MG/5GM | 15 days supply | Qty: 150 | Fill #5

## 2017-04-20 DIAGNOSIS — J4520 Mild intermittent asthma, uncomplicated: Secondary | ICD-10-CM | POA: Diagnosis not present

## 2017-04-20 DIAGNOSIS — J209 Acute bronchitis, unspecified: Secondary | ICD-10-CM | POA: Diagnosis not present

## 2017-04-20 MED FILL — VALSARTAN 80 MG TABLET: 80 | 30 days supply | Qty: 30 | Fill #7

## 2017-04-20 MED FILL — AZITHROMYCIN 250 MG TABLET: 250 | 5 days supply | Qty: 6 | Fill #0

## 2017-04-21 ENCOUNTER — Ambulatory Visit: Payer: 59 | Admitting: Cardiology

## 2017-04-21 ENCOUNTER — Encounter: Payer: Self-pay | Admitting: Cardiology

## 2017-04-21 VITALS — BP 110/62 | HR 97 | Resp 16 | Ht 70.0 in | Wt 192.2 lb

## 2017-04-21 DIAGNOSIS — R0609 Other forms of dyspnea: Secondary | ICD-10-CM | POA: Diagnosis not present

## 2017-04-21 DIAGNOSIS — I428 Other cardiomyopathies: Secondary | ICD-10-CM

## 2017-04-21 DIAGNOSIS — E785 Hyperlipidemia, unspecified: Secondary | ICD-10-CM | POA: Diagnosis not present

## 2017-04-21 DIAGNOSIS — I10 Essential (primary) hypertension: Secondary | ICD-10-CM

## 2017-04-21 DIAGNOSIS — R5383 Other fatigue: Secondary | ICD-10-CM | POA: Diagnosis not present

## 2017-04-21 DIAGNOSIS — Z8249 Family history of ischemic heart disease and other diseases of the circulatory system: Secondary | ICD-10-CM | POA: Diagnosis not present

## 2017-04-21 DIAGNOSIS — R0600 Dyspnea, unspecified: Secondary | ICD-10-CM

## 2017-04-21 MED ORDER — VALSARTAN 40 MG PO TABS: 40.0000 mg | ORAL_TABLET | Freq: Every day | ORAL | 3 refills | Status: DC

## 2017-04-21 MED ORDER — METOPROLOL TARTRATE 50 MG PO TABS: 50.0000 mg | ORAL_TABLET | Freq: Once | ORAL | 0 refills | Status: DC

## 2017-04-21 MED ORDER — GABAPENTIN 100 MG PO CAPS: 100.0000 mg | ORAL_CAPSULE | Freq: Every day | ORAL | 3 refills | Status: DC

## 2017-04-21 MED ORDER — VENTOLIN HFA 108 (90 BASE) MCG/ACT IN AERS: 1.0000 | INHALATION_SPRAY | Freq: Four times a day (QID) | RESPIRATORY_TRACT | 1 refills | Status: DC | PRN

## 2017-04-21 MED FILL — GABAPENTIN 100 MG CAPSULE: 100 | 90 days supply | Qty: 90 | Fill #0

## 2017-04-21 MED FILL — METOPROLOL TARTRATE 50 MG T: 50 | 1 days supply | Qty: 1 | Fill #0

## 2017-04-21 MED FILL — VALSARTAN 40 MG TABLET: 40 | 90 days supply | Qty: 90 | Fill #0

## 2017-04-21 MED FILL — VENTOLIN HFA 90 MCG INHALER: 108 (90 BAS | 50 days supply | Qty: 18 | Fill #0

## 2017-04-21 NOTE — Patient Instructions (Addendum)
Medication Instructions:   DECREASE YOUR VALSARTAN TO 40 MG ONCE DAILY    Labwork:  TODAY--CMET, CBC W DIFF, TSH, LIPIDS, AND VITAMIN D LEVEL    Testing/Procedures:  Your physician has requested that you have an echocardiogram. Echocardiography is a painless test that uses sound waves to create images of your heart. It provides your doctor with information about the size and shape of your heart and how well your heart's chambers and valves are working. This procedure takes approximately one hour. There are no restrictions for this procedure.    CORONARY CT WITH FFR Please arrive at the [facility] main entrance of [facility] at xx:xx AM (30-45 minutes prior to test start time)  [facility] [address] [place], [place] [address] [phone]  Proceed to the [facility] Radiology Department (First Floor).  Please follow these instructions carefully (unless otherwise directed):    On the Night Before the Test: . Drink plenty of water. . Do not consume any caffeinated/decaffeinated beverages or chocolate 12 hours prior to your test. . Do not take any antihistamines 12 hours prior to your test. . If you take [place] do not take 24 hours prior to test.   On the Day of the Test: . Drink plenty of water. Do not drink any water within one hour of the test. . Do not eat any food 4 hours prior to the test. . You may take your regular medications prior to the test. . IF NOT ON A BETA BLOCKER - Take 50 mg of lopressor (metoprolol) one hour before the test.   After the Test: . Drink plenty of water. . After receiving IV contrast, you may experience a mild flushed feeling. This is normal. . On occasion, you may experience a mild rash up to 24 hours after the test. This is not dangerous. If this occurs, you can take Benadryl 25 mg and increase your fluid intake. . If you experience trouble breathing, this can be serious. If it is severe call 911  IMMEDIATELY. If it is mild, please call our office. . If you take any of these medications: [place] please do not take 48 hours after completing test.     Follow-Up:  Your physician wants you to follow-up in: [place] will receive a reminder letter in the mail two months in advance. If you don't receive a letter, please call our office to schedule the follow-up appointment.      If you need a refill on your cardiac medications before your next appointment, please call your pharmacy.

## 2017-04-21 NOTE — Progress Notes (Signed)
Patient ID: Reginald Rios, male   DOB: Sep 15, 1955, 61 y.o.   MRN: [id number]      Cardiology Office Note   Date:  04/21/2017   ID:  JOBE MUTCH, DOB [birth year], MRN [id number]  PCP:  Reginald Shanks, MD  Cardiologist:  Reginald Dawley, MD   Chief complain: DOE, fatigue  History of Present Illness: Reginald Rios is a very pleasant 61 y.o. male , whose wife is a Teacher, music and comes in for new DOE and exertional fatigue. The patient is very active playing tennis 6x/week, hiking, still working. He has no prior cardiac history, both of his parents died of CVA in 59'.  He has h/o DM x 7 years.  He has noticed that lately he gets progressively more tired with exertion and on his most recent hike he was not able to keep up with his wife. He denies and CP, palpitations or syncope. No claudications.  He is followed by PCP, no lipid management in the past.   05/15/2015 - this is 2 months follow-up, the patient underwent stress testing that showed no ischemia but decreased ejection fraction that was confirmed on the echocardiogram with LVEF 45-50%. He was prescribed pravastatin and losartan. Today he states that he feels like he has even less energy and some days he just doesn't seem like doing anything. His wife believes he is being depressed. He doesn't remember being depressed before. He denies any chest pain or shortness of breath. He continues to play tennis but only 3 times a week not 7 times a week. He uses Ritalin multiple times a day for energy especially before playing tennis. He denies any orthopnea, paroxysmal nocturnal dyspnea or lower extremity edema.  08/28/2015 - 3 months follow-up, patient denies any chest pain shortness of breath lower extremity edema orthopnea or proximal nocturnal dyspnea. However he continues feeling tired. 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 set.  We have repeated his echocardiogram in March 2017 and his TTE and LVEF has improved now low-normal estimated at 50-55%. He is complaining of chronic nagging cough that is nonproductive for the last 2 months. He has stopped taking pravastatin as he believed was making him depressed.  03/16/2016 - 6 months follow-up, the patient continues to play tennis 3 times a week and has no chest pain or shortness of breath, he states that he feels more tired than several years ago but no sudden change rather he attributes it to aging. He has had a normal stress test in October 2016. He has been experiencing neck pain and right arm pain related to C5-C6 radical fatigue and is seeing neurosurgeon later today. Denies any palpitations, dizziness or syncope. He is tolerating pravastatin much better than atorvastatin.  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 He has also noticed that when hiking and mountain he became short of breath. His father had a myocardial infarction followed by death at age of 64. He was switched from atorvastatin to pravastatin with muscle pain but when he started to take coenzyme Q 10 has resolved as well as his discopathy.  Past Medical History:  Diagnosis Date  . ADD (attention deficit disorder)   . Anemia   .  Arthritis   . Diabetes mellitus without complication ([place])   . Exercise-induced asthma   . Heart murmur   . Hyperlipidemia    family hx of high cholesterol  . Hypogonadism in male   . Low back pain   . Osteoarthritis     Past Surgical History:  Procedure Laterality Date  . KNEE ARTHROSCOPY    . TENOTOMY ACHILLES TENDON       Current Outpatient Medications  Medication Sig Dispense Refill  . aspirin 81 MG chewable tablet Chew by mouth.    Marland Kitchen azithromycin (ZITHROMAX) 250 MG tablet     . gabapentin (NEURONTIN) 100 MG  capsule Take 1 capsule (100 mg total) by mouth at bedtime. 90 capsule 3  . JANUMET 50-1000 MG per tablet TAKE 1 TABLET BY MOUTH TWICE DAILY WITH A MEAL 180 tablet 1  . meloxicam (MOBIC) 7.5 MG tablet Take 2 tablets (15 mg total) by mouth daily as needed for pain. 30 tablet 2  . methylphenidate (RITALIN) 20 MG tablet Take 20 mg by mouth daily.    . montelukast (SINGULAIR) 10 MG tablet Take 1 tablet (10 mg total) by mouth daily. 90 tablet 3  . nateglinide (STARLIX) 120 MG tablet Take 1 tablet (120 mg total) by mouth daily. 90 tablet 1  . pravastatin (PRAVACHOL) 10 MG tablet Take 1 tablet (10 mg total) by mouth every evening. (Patient taking differently: Take 20 mg by mouth every evening. ) 30 tablet 11  . predniSONE (STERAPRED UNI-PAK 21 TAB) 10 MG (21) TBPK tablet Take as directed 21 tablet 0  . tadalafil (CIALIS) 10 MG tablet Take 1 tablet (10 mg total) by mouth daily as needed for erectile dysfunction. 10 tablet 3  . testosterone (ANDROGEL) 50 MG/5GM (1%) GEL APPLY 5 GRAMS (1 TUBE) TO EACH UPPER ARM AREA EVERY MORNING (Patient taking differently: APPLY 10 GRAMS ( 2 TUBE) TO EACH UPPER ARM AREA EVERY MORNING) 150 g 3  . valsartan (DIOVAN) 80 MG tablet Take 1 tablet (80 mg total) by mouth daily. 30 tablet 11  . VENTOLIN HFA 108 (90 BASE) MCG/ACT inhaler Inhale 1 puff into the lungs every 6 (six) hours as needed (cough).   1   No current facility-administered medications for this visit.     Allergies:   Invokana [canagliflozin] and Statins    Social History:  The patient  reports that  has never smoked. he has never used smokeless tobacco. He reports that he drinks alcohol. He reports that he does not use drugs.   Family History:  The patient's family history includes Coronary artery disease (age of onset: 25) in his father; Hypothyroidism in his brother; Lung cancer (age of onset: 96) in his mother.   ROS:  Please see the history of present illness.   Otherwise, review of systems are positive  for none.   All other systems are reviewed and negative.   PHYSICAL EXAM: VS:  BP 110/62   Pulse 97   Resp 16   Ht 5\' 10"  (1.778 m)   Wt 192 lb 3.2 oz (87.2 kg)   SpO2 98%   BMI 27.58 kg/m  , BMI Body mass index is 27.58 kg/m. GEN: Well nourished, well developed, in no acute distress  HEENT: normal  Neck: no JVD, carotid bruits, or masses Cardiac: RRR; no murmurs, rubs, or gallops,no edema  Respiratory:  clear to auscultation bilaterally, normal work of breathing GI: soft, nontender, nondistended, + BS MS: no deformity or atrophy  Skin: warm and  dry, no rash Neuro:  Strength and sensation are intact Psych: euthymic mood, full affect  EKG: SR, LVH  Recent Labs: 10/05/2016: ALT 16; BUN 15; Creatinine, Ser 0.79; Hemoglobin 12.7; Platelets 247.0; Potassium 3.8; Sodium 140   Lipid Panel    Component Value Date/Time   CHOL 126 09/16/2015 1038   TRIG 56 09/16/2015 1038   HDL 47 09/16/2015 1038   CHOLHDL 2.7 09/16/2015 1038   VLDL 11 09/16/2015 1038   LDLCALC 68 09/16/2015 1038    Wt Readings from Last 3 Encounters:  04/21/17 192 lb 3.2 oz (87.2 kg)  10/05/16 185 lb (83.9 kg)  03/16/16 188 lb (85.3 kg)    Other studies Reviewed: Records from PCP  Nuclear stress test:  Nuclear stress EF: 52%.  There was no ST segment deviation noted during stress.  The left ventricular ejection fraction is mildly decreased (45-54%).  This is a low risk study.  No ischemia Thinning of the mid and basal inferior wall with normal motion consistent with diaphragmatic attenuation EF 52%   TTE: 07/03/2015 - Left ventricle: The cavity size was normal. Systolic function was  normal. The estimated ejection fraction was in the range of 50%  to 55%. Wall motion was normal; there were no regional wall  motion abnormalities. Doppler parameters are consistent with  abnormal left ventricular relaxation (grade 1 diastolic  dysfunction). There was no evidence of elevated ventricular   filling pressure by Doppler parameters. - Aortic valve: Trileaflet; normal thickness leaflets. There was no  regurgitation. - Mitral valve: Mildly thickened leaflets . There was trivial  regurgitation. - Right ventricle: The cavity size was normal. Wall thickness was  normal. Systolic function was normal. - Right atrium: The atrium was normal in size. - Tricuspid valve: There was no regurgitation. - Pulmonary arteries: Systolic pressure was within the normal  range. - Inferior vena cava: The vessel was normal in size. - Pericardium, extracardiac: There was no pericardial effusion.  EKG done on 04/21/2017 showed normal sinus rhythm with sinus arrhythmia otherwise normal EKG and unchanged prior, this was personally reviewed.    ASSESSMENT AND PLAN:  1. DOE, exertional fatigue - normal exercise nuclear stress test with no evidence of infarct or ischemia in 2016.  We will performed calcium scoring coronary CTA to further risk stratify evaluate for coronary artery disease and evaluate how much stopping patient needs.  We will also reevaluate echocardiogram as he previously had history of nonischemic cardiomyopathy and hasn't had echo in 2 years.  In 2016 LVEF 45-50%, this has improved to 50-55%, he was intolerant to losartan and lisinopril currently tolerating valsartan. He is shortness of breath has improved.  2. Nonischemic cardiomyopathy - LVEF now low normal in 2017, will repeat echocardiogram.  3. Hyperlipidemia - he was intolerant to atorvastatin, he was switched to pravastatin 10 mg daily and lipids were at goal in May 2017. He tolerates it with addition of coenzyme Q10, we will order coronary CT to further evaluate plaque burden.  4. Depression -we have tried changing several medication with no significant improvement. Doubt that cardiac medicines are causing his depression.  Follow up in 1 year.  Signed, Reginald Dawley, MD  04/21/2017 9:28 AM    [place], [place], [place]  [address] Phone: [phone]; Fax: [phone]

## 2017-04-22 ENCOUNTER — Telehealth: Payer: Self-pay | Admitting: Cardiology

## 2017-04-22 DIAGNOSIS — R7989 Other specified abnormal findings of blood chemistry: Secondary | ICD-10-CM | POA: Insufficient documentation

## 2017-04-22 HISTORY — DX: Other specified abnormal findings of blood chemistry: R79.89

## 2017-04-22 LAB — COMPREHENSIVE METABOLIC PANEL
ALT: 25 IU/L (ref 0–44)
AST: 18 IU/L (ref 0–40)
Albumin/Globulin Ratio: 2 (ref 1.2–2.2)
Albumin: 4.6 g/dL (ref 3.6–4.8)
Alkaline Phosphatase: 72 IU/L (ref 39–117)
BUN/Creatinine Ratio: 18 (ref 10–24)
BUN: 13 mg/dL (ref 8–27)
Bilirubin Total: 0.3 mg/dL (ref 0.0–1.2)
CO2: 23 mmol/L (ref 20–29)
Calcium: 9.5 mg/dL (ref 8.6–10.2)
Chloride: 101 mmol/L (ref 96–106)
Creatinine, Ser: 0.74 mg/dL — ABNORMAL LOW (ref 0.76–1.27)
GFR calc Af Amer: 113 mL/min/{1.73_m2} (ref >59)
GFR calc non Af Amer: 98 mL/min/{1.73_m2} (ref >59)
Globulin, Total: 2.3 g/dL (ref 1.5–4.5)
Glucose: 105 mg/dL — ABNORMAL HIGH (ref 65–99)
Potassium: 4.7 mmol/L (ref 3.5–5.2)
Sodium: 138 mmol/L (ref 134–144)
Total Protein: 6.9 g/dL (ref 6.0–8.5)

## 2017-04-22 LAB — CBC WITH DIFFERENTIAL/PLATELET
Basophils Absolute: 0 10*3/uL (ref 0.0–0.2)
Basos: 0 %
EOS (ABSOLUTE): 0.1 10*3/uL (ref 0.0–0.4)
Eos: 2 %
Hematocrit: 37.2 % — ABNORMAL LOW (ref 37.5–51.0)
Hemoglobin: 13.2 g/dL (ref 13.0–17.7)
Immature Grans (Abs): 0 10*3/uL (ref 0.0–0.1)
Immature Granulocytes: 0 %
Lymphocytes Absolute: 1.9 10*3/uL (ref 0.7–3.1)
Lymphs: 29 %
MCH: 32.2 pg (ref 26.6–33.0)
MCHC: 35.5 g/dL (ref 31.5–35.7)
MCV: 91 fL (ref 79–97)
Monocytes Absolute: 0.7 10*3/uL (ref 0.1–0.9)
Monocytes: 10 %
Neutrophils Absolute: 3.9 10*3/uL (ref 1.4–7.0)
Neutrophils: 59 %
Platelets: 208 10*3/uL (ref 150–379)
RBC: 4.1 x10E6/uL — ABNORMAL LOW (ref 4.14–5.80)
RDW: 13.9 % (ref 12.3–15.4)
WBC: 6.7 10*3/uL (ref 3.4–10.8)

## 2017-04-22 LAB — TSH: TSH: 2.28 u[IU]/mL (ref 0.450–4.500)

## 2017-04-22 LAB — LIPID PANEL
Chol/HDL Ratio: 2.8 ratio (ref 0.0–5.0)
Cholesterol, Total: 150 mg/dL (ref 100–199)
HDL: 53 mg/dL (ref >39)
LDL Calculated: 74 mg/dL (ref 0–99)
Triglycerides: 114 mg/dL (ref 0–149)
VLDL Cholesterol Cal: 23 mg/dL (ref 5–40)

## 2017-04-22 LAB — VITAMIN D 25 HYDROXY (VIT D DEFICIENCY, FRACTURES): Vit D, 25-Hydroxy: 24.4 ng/mL — ABNORMAL LOW (ref 30.0–100.0)

## 2017-04-22 MED ORDER — VITAMIN D (ERGOCALCIFEROL) 1.25 MG (50000 UNIT) PO CAPS: 50000.0000 [IU] | ORAL_CAPSULE | ORAL | 0 refills | Status: DC

## 2017-04-22 MED FILL — VIT D2 1.25 MG (50,000 UNIT: 1.25 MG | 56 days supply | Qty: 8 | Fill #0

## 2017-04-22 NOTE — Telephone Encounter (Signed)
F/u message ° °Pt returning RN call  °

## 2017-04-22 NOTE — Telephone Encounter (Signed)
  Notes recorded by Dorothy Spark, MD on 04/22/2017 at 12:11 PM EST He has very low vitamin D and needs to start taking supplements  Notes recorded by Dorothy Spark, MD on 04/21/2017 at 4:48 PM EST Normal thyroid function  Notes recorded by Dorothy Spark, MD on 04/21/2017 at 4:08 PM EST Normal kidney and liver function electrolytes, no anemia, all lipids at goal, pending vitamin D and thyroid function.     Spoke with the pt and informed him that per Dr Meda Coffee, his labs were all normal, but his Vitamin D level is very low and she recommends that he start taking supplements.  Clarified with our PharmD [place] how much Vit D should the pt be taking with a level of 24.4.  Per [facility] Pharmacist, the pt needs to take Vitamin D (50,000 Units total) po every 7 days for 8 weeks, and have a repeat Vit D level checked at that time, for proper dosing of maintenance Vit D, thereafter. Confirmed the pharmacy of choice with the pt.  Advised the pt to take it the same day every week.  Rescheduled the pt an 8 week lab appt to recheck his Vit D level for 06/15/17.  Pt verbalized understanding and agrees with this plan.

## 2017-04-22 NOTE — Telephone Encounter (Signed)
-----   Message from Dorothy Spark, MD sent at 04/22/2017 12:11 PM EST ----- He has very low vitamin D and needs to start taking supplements

## 2017-04-28 ENCOUNTER — Other Ambulatory Visit (HOSPITAL_COMMUNITY): Payer: 59

## 2017-05-05 ENCOUNTER — Other Ambulatory Visit: Payer: Self-pay

## 2017-05-05 ENCOUNTER — Ambulatory Visit (HOSPITAL_COMMUNITY): Payer: 59 | Attending: Cardiovascular Disease

## 2017-05-05 DIAGNOSIS — R0609 Other forms of dyspnea: Secondary | ICD-10-CM | POA: Diagnosis not present

## 2017-05-05 DIAGNOSIS — I10 Essential (primary) hypertension: Secondary | ICD-10-CM | POA: Diagnosis not present

## 2017-05-05 DIAGNOSIS — Z8249 Family history of ischemic heart disease and other diseases of the circulatory system: Secondary | ICD-10-CM | POA: Insufficient documentation

## 2017-05-05 DIAGNOSIS — R5383 Other fatigue: Secondary | ICD-10-CM | POA: Diagnosis not present

## 2017-05-05 DIAGNOSIS — I428 Other cardiomyopathies: Secondary | ICD-10-CM | POA: Diagnosis not present

## 2017-05-05 DIAGNOSIS — E785 Hyperlipidemia, unspecified: Secondary | ICD-10-CM | POA: Diagnosis not present

## 2017-05-05 DIAGNOSIS — R0600 Dyspnea, unspecified: Secondary | ICD-10-CM

## 2017-05-07 MED FILL — TESTOSTERONE 50 MG/5GM (1%): 50 MG/5GM | 90 days supply | Qty: 900 | Fill #0

## 2017-05-17 ENCOUNTER — Telehealth: Payer: Self-pay | Admitting: *Deleted

## 2017-05-17 MED ORDER — METOPROLOL TARTRATE 50 MG PO TABS: 50.0000 mg | ORAL_TABLET | Freq: Once | ORAL | 0 refills | Status: DC

## 2017-05-17 MED FILL — METOPROLOL TARTRATE 50 MG T: 50 | 1 days supply | Qty: 1 | Fill #0

## 2017-05-17 NOTE — Telephone Encounter (Signed)
Spoke with the pt about his scheduled coronary ct. Reiterated his CT instructions to him, as given to the pt at his last OV with Dr Meda Coffee.  Pt states he has misplaced his one time dose of metoprolol tartrate 50 mg po for one dose only,  take 1 hr prior to his coronary ct. Resent this prescription into the pts confirmed pharmacy, and instructed him to only take this the day his coronary ct, and  take this 1 hour prior to the ct time.  Pt verbalized understanding and agrees with this plan.

## 2017-05-21 MED FILL — METHYLPHENIDATE 20 MG TAB: 20 | 30 days supply | Qty: 60 | Fill #0

## 2017-05-21 MED FILL — JANUMET 50-1,000 MG TABLET: 50-1000 | 90 days supply | Qty: 180 | Fill #0

## 2017-06-08 ENCOUNTER — Ambulatory Visit (HOSPITAL_COMMUNITY)
Admission: RE | Admit: 2017-06-08 | Discharge: 2017-06-08 | Disposition: A | Discharge status: Home / Self Care | Payer: 59 | Source: Ambulatory Visit | Attending: Cardiology | Admitting: Cardiology

## 2017-06-08 ENCOUNTER — Encounter (HOSPITAL_COMMUNITY): Payer: Self-pay

## 2017-06-08 DIAGNOSIS — R0609 Other forms of dyspnea: Secondary | ICD-10-CM

## 2017-06-08 DIAGNOSIS — I2510 Atherosclerotic heart disease of native coronary artery without angina pectoris: Secondary | ICD-10-CM | POA: Diagnosis not present

## 2017-06-08 DIAGNOSIS — E785 Hyperlipidemia, unspecified: Secondary | ICD-10-CM

## 2017-06-08 DIAGNOSIS — R5383 Other fatigue: Secondary | ICD-10-CM

## 2017-06-08 DIAGNOSIS — I428 Other cardiomyopathies: Secondary | ICD-10-CM

## 2017-06-08 DIAGNOSIS — K760 Fatty (change of) liver, not elsewhere classified: Secondary | ICD-10-CM | POA: Diagnosis not present

## 2017-06-08 DIAGNOSIS — I10 Essential (primary) hypertension: Secondary | ICD-10-CM

## 2017-06-08 DIAGNOSIS — R0600 Dyspnea, unspecified: Secondary | ICD-10-CM

## 2017-06-08 DIAGNOSIS — Z8249 Family history of ischemic heart disease and other diseases of the circulatory system: Secondary | ICD-10-CM | POA: Diagnosis not present

## 2017-06-08 LAB — POCT I-STAT CREATININE: Creatinine, Ser: 0.8 mg/dL (ref 0.61–1.24)

## 2017-06-08 MED ORDER — METOPROLOL TARTRATE 5 MG/5ML IV SOLN
5.0000 mg | Freq: Once | INTRAVENOUS | Status: AC
  Administered 2017-06-08: 5 mg via INTRAVENOUS

## 2017-06-08 MED ORDER — NITROGLYCERIN 0.4 MG SL SUBL
0.8000 mg | SUBLINGUAL_TABLET | Freq: Once | SUBLINGUAL | Status: AC
  Administered 2017-06-08: 0.8 mg via SUBLINGUAL

## 2017-06-08 MED ORDER — METOPROLOL TARTRATE 5 MG/5ML IV SOLN
INTRAVENOUS | Status: AC
  Administered 2017-06-08: 5 mg via INTRAVENOUS
  Filled 2017-06-08: qty 5

## 2017-06-08 MED ORDER — IOPAMIDOL (ISOVUE-370) INJECTION 76%
INTRAVENOUS | Status: AC
  Administered 2017-06-08: 80 mL
  Filled 2017-06-08: qty 100

## 2017-06-08 MED ORDER — NITROGLYCERIN 0.4 MG SL SUBL
SUBLINGUAL_TABLET | SUBLINGUAL | Status: AC
  Administered 2017-06-08: 0.8 mg via SUBLINGUAL
  Filled 2017-06-08: qty 2

## 2017-06-08 NOTE — Progress Notes (Signed)
Tolerated CT without incident. Gave patient diet coke and peanut butter crackers. Ambulatory steady gait.

## 2017-06-10 NOTE — H&P (View-Only) (Signed)
Cardiology Office Note   Date:  06/11/2017   ID:  Reginald Rios, DOB April 22, 1956, MRN [id number]  PCP:  Vernie Shanks, MD  Cardiologist:  Dr. Meda Coffee    Chief Complaint  Patient presents with  . Results    abnormal cardiac CT      History of Present Illness: Reginald Rios is a 62 y.o. male who presents for results of abnormal cardiac CTA and discuss cardiac cath  He feels more tired than he used to and is unable to play as many sets as he used to. He has also noticed that when hiking and mountain he became short of breath. His father had a myocardial infarction followed by death at age of 20. He was switched from atorvastatin to pravastatin with muscle pain but when he started to take coenzyme Q 10 has resolved as well as his discopathy. With the fatigue Echo and cardiac CTA were ordered.  Echo with EF 50-55% unchanged, no pulmonary HTN  1. Left Main:  No significant stenosis.  2. LAD: No significant stenosis. 3. D1: No significant stenosis. 4. LCX: Proximal: 0.89, mid LCX: 0.85, distal LCX: 0.80. 5. OM1: Proximal: 0.83, distal: 0.77. 6. RCA: Small non-dominant.  IMPRESSION: 1. CT FFR analysis showed significant stenoses in the mid LCX and proximal OM1 arteries. A cardiac catheterization is recommended.  No acute extra cardiac abnormality. Does have Fatty infiltration of the liver.    With the blockage in the LCX Dr. Meda Coffee would like to proceed with cardiac cath to further eval and treat if needed with stent.  Discussed cath in detail with pt and wife.  Risks included.    Past Medical History:  Diagnosis Date  . ADD (attention deficit disorder)   . Anemia   . Arthritis   . Diabetes mellitus without complication ([place])   . Exercise-induced asthma   . Heart murmur   . Hyperlipidemia    family hx of high cholesterol  . Hypogonadism in male   . Low back pain   . Osteoarthritis     Past Surgical History:  Procedure Laterality Date  . KNEE ARTHROSCOPY     . TENOTOMY ACHILLES TENDON       Current Outpatient Medications  Medication Sig Dispense Refill  . aspirin 81 MG chewable tablet Chew by mouth.    Marland Kitchen azithromycin (ZITHROMAX) 250 MG tablet     . gabapentin (NEURONTIN) 100 MG capsule Take 1 capsule (100 mg total) by mouth at bedtime. 90 capsule 3  . JANUMET 50-1000 MG per tablet TAKE 1 TABLET BY MOUTH TWICE DAILY WITH A MEAL 180 tablet 1  . meloxicam (MOBIC) 7.5 MG tablet Take 2 tablets (15 mg total) by mouth daily as needed for pain. 30 tablet 2  . methylphenidate (RITALIN) 20 MG tablet Take 20 mg by mouth daily.    . montelukast (SINGULAIR) 10 MG tablet Take 1 tablet (10 mg total) by mouth daily. 90 tablet 3  . nateglinide (STARLIX) 120 MG tablet Take 1 tablet (120 mg total) by mouth daily. 90 tablet 1  . pravastatin (PRAVACHOL) 10 MG tablet Take 1 tablet (10 mg total) by mouth every evening. (Patient taking differently: Take 20 mg by mouth every evening. ) 30 tablet 11  . tadalafil (CIALIS) 10 MG tablet Take 1 tablet (10 mg total) by mouth daily as needed for erectile dysfunction. 10 tablet 3  . testosterone (ANDROGEL) 50 MG/5GM (1%) GEL APPLY 5 GRAMS (1 TUBE) TO EACH UPPER ARM AREA  EVERY MORNING (Patient taking differently: APPLY 10 GRAMS ( 2 TUBE) TO EACH UPPER ARM AREA EVERY MORNING) 150 g 3  . valsartan (DIOVAN) 40 MG tablet Take 1 tablet (40 mg total) by mouth daily. 90 tablet 3  . VENTOLIN HFA 108 (90 Base) MCG/ACT inhaler Inhale 1 puff into the lungs every 6 (six) hours as needed (cough). 2 each 1  . Vitamin D, Ergocalciferol, (DRISDOL) 50000 units CAPS capsule Take 1 capsule (50,000 Units total) by mouth every 7 (seven) days. 8 capsule 0   No current facility-administered medications for this visit.     Allergies:   Invokana [canagliflozin] and Statins    Social History:  The patient  reports that  has never smoked. he has never used smokeless tobacco. He reports that he drinks alcohol. He reports that he does not use drugs.    Family History:  The patient's family history includes Coronary artery disease (age of onset: 48) in his father; Hypothyroidism in his brother; Lung cancer (age of onset: 35) in his mother.    ROS:  General:no colds or fevers, no weight changes Skin:no rashes or ulcers HEENT:no blurred vision, no congestion CV:see HPI PUL:see HPI GI:no diarrhea constipation or melena, no indigestion GU:no hematuria, no dysuria MS:no joint pain, no claudication Neuro:no syncope, no lightheadedness Endo:+ diabetes, no thyroid disease  Wt Readings from Last 3 Encounters:  06/11/17 193 lb 12.8 oz (87.9 kg)  04/21/17 192 lb 3.2 oz (87.2 kg)  10/05/16 185 lb (83.9 kg)     PHYSICAL EXAM: VS:  BP 114/60   Pulse 83   Ht 5\' 10"  (1.778 m)   Wt 193 lb 12.8 oz (87.9 kg)   SpO2 95%   BMI 27.81 kg/m  , BMI Body mass index is 27.81 kg/m. General:Pleasant affect, NAD Skin:Warm and dry, brisk capillary refill HEENT:normocephalic, sclera clear, mucus membranes moist Neck:supple, no JVD, no bruits  Heart:S1S2 RRR without murmur, gallup, rub or click Lungs:clear without rales, rhonchi, or wheezes GMW:NUUV, non tender, + BS, do not palpate liver spleen or masses Ext:no lower ext edema, 2+ pedal pulses, 2+ radial pulses Neuro:alert and oriented X 3, MAE, follows commands, + facial symmetry    EKG:  EKG is ordered today. The ekg ordered today demonstrates SR normal EKG. No changes.   Recent Labs: 04/21/2017: ALT 25; BUN 13; Hemoglobin 13.2; Platelets 208; Potassium 4.7; Sodium 138; TSH 2.280 06/08/2017: Creatinine, Ser 0.80    Lipid Panel    Component Value Date/Time   CHOL 150 04/21/2017 1019   TRIG 114 04/21/2017 1019   HDL 53 04/21/2017 1019   CHOLHDL 2.8 04/21/2017 1019   CHOLHDL 2.7 09/16/2015 1038   VLDL 11 09/16/2015 1038   LDLCALC 74 04/21/2017 1019       Other studies Reviewed: Additional studies/ records that were reviewed today include:   cardiac CTA 06/08/17. FINDINGS: FFRct  analysis was performed on the original cardiac CT angiogram dataset. Diagrammatic representation of the FFRct analysis is provided in a separate PDF document in PACS. This dictation was created using the PDF document and an interactive 3D model of the results. 3D model is not available in the EMR/PACS. Normal FFR range is >0.80.  1. Left Main:  No significant stenosis.  2. LAD: No significant stenosis. 3. D1: No significant stenosis. 4. LCX: Proximal: 0.89, mid LCX: 0.85, distal LCX: 0.80. 5. OM1: Proximal: 0.83, distal: 0.77. 6. RCA: Small non-dominant.  IMPRESSION: 1. CT FFR analysis showed significant stenoses in the mid LCX  and proximal OM1 arteries. A cardiac catheterization is recommended.  Coronary calcium score of 1715. This was 52 percentile for age and sex matched control. Dilated pulmonary artery measuring 32 mm suspicious for pulmonary Hypertension  General read:  IMPRESSION: No acute extra cardiac abnormality.  Fatty infiltration of the liver.  ECHO: 05/05/17 Procedure narrative: Transthoracic echocardiography. Image   quality was adequate. The study was technically difficult. - Left ventricle: The cavity size was normal. Systolic function was   normal. The estimated ejection fraction was in the range of 50%   to 55%. Wall motion was normal; there were no regional wall   motion abnormalities. Left ventricular diastolic function   parameters were normal. - Aortic valve: Transvalvular velocity was within the normal range.   There was no stenosis. There was no regurgitation. - Mitral valve: Transvalvular velocity was within the normal range.   There was no evidence for stenosis. There was trivial   regurgitation. - Right ventricle: The cavity size was normal. Wall thickness was   normal. Systolic function was normal. - Tricuspid valve: There was trivial regurgitation. - Pulmonary arteries: Systolic pressure was within the normal   range. PA peak  pressure: 27 mm Hg (S).    ASSESSMENT AND PLAN:  1.  Increased fatigue in male with premature FH CAD, diabetes,  HLD, now with CAD on cardiac CT and high grade stenosis in LCX by FFR.  Discussed cardiac cath   He will follow up after cath  The patient understands that risks included but are not limited to stroke (1 in 1000), death (1 in 1000), kidney failure [usually temporary] (1 in 500), bleeding (1 in 200), allergic reaction [possibly serious] (1 in 200).   He and his wife are agreeable to proceed.  He will be out of town next week but will procceed with cath the following week.  Discussed with Dr. Meda Coffee.  If he develops extreme fatigue or chest pain he should go to ER.    2.  Hx NICM with normal EF now.    3.  HLD on statin may need to adjust depending on cath  4.  Low Vit D level.       Current medicines are reviewed with the patient today.  The patient Has no concerns regarding medicines.  The following changes have been made:  See above Labs/ tests ordered today include:see above  Disposition:   FU:  see above  Signed, Cecilie Kicks, NP  06/11/2017 5:19 PM    [facility] [place], [place], [place] [place] [place], [place] Phone: [phone]; Fax: [phone]

## 2017-06-10 NOTE — Progress Notes (Signed)
Cardiology Office Note   Date:  06/11/2017   ID:  Reginald Rios, DOB 11-23-1955, MRN [id number]  PCP:  Vernie Shanks, MD  Cardiologist:  Dr. Meda Coffee    Chief Complaint  Patient presents with  . Results    abnormal cardiac CT      History of Present Illness: Reginald Rios is a 62 y.o. male who presents for results of abnormal cardiac CTA and discuss cardiac cath  He feels more tired than he used to and is unable to play as many sets as he used to. He has also noticed that when hiking and mountain he became short of breath. His father had a myocardial infarction followed by death at age of 13. He was switched from atorvastatin to pravastatin with muscle pain but when he started to take coenzyme Q 10 has resolved as well as his discopathy. With the fatigue Echo and cardiac CTA were ordered.  Echo with EF 50-55% unchanged, no pulmonary HTN  1. Left Main:  No significant stenosis.  2. LAD: No significant stenosis. 3. D1: No significant stenosis. 4. LCX: Proximal: 0.89, mid LCX: 0.85, distal LCX: 0.80. 5. OM1: Proximal: 0.83, distal: 0.77. 6. RCA: Small non-dominant.  IMPRESSION: 1. CT FFR analysis showed significant stenoses in the mid LCX and proximal OM1 arteries. A cardiac catheterization is recommended.  No acute extra cardiac abnormality. Does have Fatty infiltration of the liver.    With the blockage in the LCX Dr. Meda Coffee would like to proceed with cardiac cath to further eval and treat if needed with stent.  Discussed cath in detail with pt and wife.  Risks included.    Past Medical History:  Diagnosis Date  . ADD (attention deficit disorder)   . Anemia   . Arthritis   . Diabetes mellitus without complication ([place])   . Exercise-induced asthma   . Heart murmur   . Hyperlipidemia    family hx of high cholesterol  . Hypogonadism in male   . Low back pain   . Osteoarthritis     Past Surgical History:  Procedure Laterality Date  . KNEE ARTHROSCOPY     . TENOTOMY ACHILLES TENDON       Current Outpatient Medications  Medication Sig Dispense Refill  . aspirin 81 MG chewable tablet Chew by mouth.    Marland Kitchen azithromycin (ZITHROMAX) 250 MG tablet     . gabapentin (NEURONTIN) 100 MG capsule Take 1 capsule (100 mg total) by mouth at bedtime. 90 capsule 3  . JANUMET 50-1000 MG per tablet TAKE 1 TABLET BY MOUTH TWICE DAILY WITH A MEAL 180 tablet 1  . meloxicam (MOBIC) 7.5 MG tablet Take 2 tablets (15 mg total) by mouth daily as needed for pain. 30 tablet 2  . methylphenidate (RITALIN) 20 MG tablet Take 20 mg by mouth daily.    . montelukast (SINGULAIR) 10 MG tablet Take 1 tablet (10 mg total) by mouth daily. 90 tablet 3  . nateglinide (STARLIX) 120 MG tablet Take 1 tablet (120 mg total) by mouth daily. 90 tablet 1  . pravastatin (PRAVACHOL) 10 MG tablet Take 1 tablet (10 mg total) by mouth every evening. (Patient taking differently: Take 20 mg by mouth every evening. ) 30 tablet 11  . tadalafil (CIALIS) 10 MG tablet Take 1 tablet (10 mg total) by mouth daily as needed for erectile dysfunction. 10 tablet 3  . testosterone (ANDROGEL) 50 MG/5GM (1%) GEL APPLY 5 GRAMS (1 TUBE) TO EACH UPPER ARM AREA  EVERY MORNING (Patient taking differently: APPLY 10 GRAMS ( 2 TUBE) TO EACH UPPER ARM AREA EVERY MORNING) 150 g 3  . valsartan (DIOVAN) 40 MG tablet Take 1 tablet (40 mg total) by mouth daily. 90 tablet 3  . VENTOLIN HFA 108 (90 Base) MCG/ACT inhaler Inhale 1 puff into the lungs every 6 (six) hours as needed (cough). 2 each 1  . Vitamin D, Ergocalciferol, (DRISDOL) 50000 units CAPS capsule Take 1 capsule (50,000 Units total) by mouth every 7 (seven) days. 8 capsule 0   No current facility-administered medications for this visit.     Allergies:   Invokana [canagliflozin] and Statins    Social History:  The patient  reports that  has never smoked. he has never used smokeless tobacco. He reports that he drinks alcohol. He reports that he does not use drugs.    Family History:  The patient's family history includes Coronary artery disease (age of onset: 29) in his father; Hypothyroidism in his brother; Lung cancer (age of onset: 30) in his mother.    ROS:  General:no colds or fevers, no weight changes Skin:no rashes or ulcers HEENT:no blurred vision, no congestion CV:see HPI PUL:see HPI GI:no diarrhea constipation or melena, no indigestion GU:no hematuria, no dysuria MS:no joint pain, no claudication Neuro:no syncope, no lightheadedness Endo:+ diabetes, no thyroid disease  Wt Readings from Last 3 Encounters:  06/11/17 193 lb 12.8 oz (87.9 kg)  04/21/17 192 lb 3.2 oz (87.2 kg)  10/05/16 185 lb (83.9 kg)     PHYSICAL EXAM: VS:  BP 114/60   Pulse 83   Ht 5\' 10"  (1.778 m)   Wt 193 lb 12.8 oz (87.9 kg)   SpO2 95%   BMI 27.81 kg/m  , BMI Body mass index is 27.81 kg/m. General:Pleasant affect, NAD Skin:Warm and dry, brisk capillary refill HEENT:normocephalic, sclera clear, mucus membranes moist Neck:supple, no JVD, no bruits  Heart:S1S2 RRR without murmur, gallup, rub or click Lungs:clear without rales, rhonchi, or wheezes YPP:JKDT, non tender, + BS, do not palpate liver spleen or masses Ext:no lower ext edema, 2+ pedal pulses, 2+ radial pulses Neuro:alert and oriented X 3, MAE, follows commands, + facial symmetry    EKG:  EKG is ordered today. The ekg ordered today demonstrates SR normal EKG. No changes.   Recent Labs: 04/21/2017: ALT 25; BUN 13; Hemoglobin 13.2; Platelets 208; Potassium 4.7; Sodium 138; TSH 2.280 06/08/2017: Creatinine, Ser 0.80    Lipid Panel    Component Value Date/Time   CHOL 150 04/21/2017 1019   TRIG 114 04/21/2017 1019   HDL 53 04/21/2017 1019   CHOLHDL 2.8 04/21/2017 1019   CHOLHDL 2.7 09/16/2015 1038   VLDL 11 09/16/2015 1038   LDLCALC 74 04/21/2017 1019       Other studies Reviewed: Additional studies/ records that were reviewed today include:   cardiac CTA 06/08/17. FINDINGS: FFRct  analysis was performed on the original cardiac CT angiogram dataset. Diagrammatic representation of the FFRct analysis is provided in a separate PDF document in PACS. This dictation was created using the PDF document and an interactive 3D model of the results. 3D model is not available in the EMR/PACS. Normal FFR range is >0.80.  1. Left Main:  No significant stenosis.  2. LAD: No significant stenosis. 3. D1: No significant stenosis. 4. LCX: Proximal: 0.89, mid LCX: 0.85, distal LCX: 0.80. 5. OM1: Proximal: 0.83, distal: 0.77. 6. RCA: Small non-dominant.  IMPRESSION: 1. CT FFR analysis showed significant stenoses in the mid LCX  and proximal OM1 arteries. A cardiac catheterization is recommended.  Coronary calcium score of 1715. This was 43 percentile for age and sex matched control. Dilated pulmonary artery measuring 32 mm suspicious for pulmonary Hypertension  General read:  IMPRESSION: No acute extra cardiac abnormality.  Fatty infiltration of the liver.  ECHO: 05/05/17 Procedure narrative: Transthoracic echocardiography. Image   quality was adequate. The study was technically difficult. - Left ventricle: The cavity size was normal. Systolic function was   normal. The estimated ejection fraction was in the range of 50%   to 55%. Wall motion was normal; there were no regional wall   motion abnormalities. Left ventricular diastolic function   parameters were normal. - Aortic valve: Transvalvular velocity was within the normal range.   There was no stenosis. There was no regurgitation. - Mitral valve: Transvalvular velocity was within the normal range.   There was no evidence for stenosis. There was trivial   regurgitation. - Right ventricle: The cavity size was normal. Wall thickness was   normal. Systolic function was normal. - Tricuspid valve: There was trivial regurgitation. - Pulmonary arteries: Systolic pressure was within the normal   range. PA peak  pressure: 27 mm Hg (S).    ASSESSMENT AND PLAN:  1.  Increased fatigue in male with premature FH CAD, diabetes,  HLD, now with CAD on cardiac CT and high grade stenosis in LCX by FFR.  Discussed cardiac cath   He will follow up after cath  The patient understands that risks included but are not limited to stroke (1 in 1000), death (1 in 1000), kidney failure [usually temporary] (1 in 500), bleeding (1 in 200), allergic reaction [possibly serious] (1 in 200).   He and his wife are agreeable to proceed.  He will be out of town next week but will procceed with cath the following week.  Discussed with Dr. Meda Coffee.  If he develops extreme fatigue or chest pain he should go to ER.    2.  Hx NICM with normal EF now.    3.  HLD on statin may need to adjust depending on cath  4.  Low Vit D level.       Current medicines are reviewed with the patient today.  The patient Has no concerns regarding medicines.  The following changes have been made:  See above Labs/ tests ordered today include:see above  Disposition:   FU:  see above  Signed, Cecilie Kicks, NP  06/11/2017 5:19 PM    [facility] [place], [place], [place], [place] Phone: [phone]; Fax: [phone]

## 2017-06-11 ENCOUNTER — Encounter: Payer: Self-pay | Admitting: Cardiology

## 2017-06-11 ENCOUNTER — Ambulatory Visit: Payer: 59 | Admitting: Cardiology

## 2017-06-11 VITALS — BP 114/60 | HR 83 | Ht 70.0 in | Wt 193.8 lb

## 2017-06-11 DIAGNOSIS — Z8249 Family history of ischemic heart disease and other diseases of the circulatory system: Secondary | ICD-10-CM | POA: Diagnosis not present

## 2017-06-11 DIAGNOSIS — R5383 Other fatigue: Secondary | ICD-10-CM

## 2017-06-11 DIAGNOSIS — E785 Hyperlipidemia, unspecified: Secondary | ICD-10-CM

## 2017-06-11 DIAGNOSIS — R931 Abnormal findings on diagnostic imaging of heart and coronary circulation: Secondary | ICD-10-CM

## 2017-06-11 NOTE — Patient Instructions (Addendum)
Medication Instructions:  Your physician recommends that you continue on your current medications as directed. Please refer to the Current Medication list given to you today.   Labwork: TODAY: CBC, BMET, PT/INR, VitD  Testing/Procedures: Your physician has requested that you have a cardiac catheterization. Cardiac catheterization is used to diagnose and/or treat various heart conditions. Doctors may recommend this procedure for a number of different reasons. The most common reason is to evaluate chest pain. Chest pain can be a symptom of coronary artery disease (CAD), and cardiac catheterization can show whether plaque is narrowing or blocking your heart's arteries. This procedure is also used to evaluate the valves, as well as measure the blood flow and oxygen levels in different parts of your heart. For further information please visit [email or web address]. Please follow instruction sheet, as given.   Follow-Up: Your physician recommends that you schedule a follow-up appointment 2 weeks after heart catheterization with APP on Dr. Francesca Oman team    Any Other Special Instructions Will Be Listed Below (If Applicable).    [facility] [address] [place] [address] Dept: [phone] Loc: [place]  06/11/2017  You are scheduled for a Cardiac Catheterization on Wednesday, February 20 with Dr. Glenetta Hew.  1. Please arrive at the [facility] (Main Entrance A) at [facility]: [address] [place], [place] [address] at 6:30 AM (two hours before your procedure to ensure your preparation). Free valet parking service is available.   Special note: Every effort is made to have your procedure done on time. Please understand that emergencies sometimes delay scheduled procedures.  2. Diet: Do not eat or drink anything after midnight prior to your procedure except  sips of water to take medications.  3. Labs: LABS TODAY  4. Medication instructions in preparation for your procedure:  DO NOT take your Janumet for 24 hours prior to procedure and for 48 hours after procedure  DO NOT take your nateglinide (starlix) the morning of your procedure   On the morning of your procedure, take your Aspirin 81 mg and any morning medicines NOT listed above.  You may use sips of water.  5. Plan for one night stay--bring personal belongings. 6. Bring a current list of your medications and current insurance cards. 7. You MUST have a responsible person to drive you home. 8. Someone MUST be with you the first 24 hours after you arrive home or your discharge will be delayed. 9. Please wear clothes that are easy to get on and off and wear slip-on shoes.  Thank you for allowing [place] to care for you!   -- [facility]     If you need a refill on your cardiac medications before your next appointment, please call your pharmacy.   Coronary Angiogram A coronary angiogram is an X-ray procedure that is used to examine the arteries in the heart. In this procedure, a dye (contrast dye) is injected through a long, thin tube (catheter). The catheter is inserted through the groin, wrist, or arm. The dye is injected into each artery, then X-rays are taken to show if there is a blockage in the arteries of the heart. This procedure can also show if you have valve disease or a disease of the aorta, and it can be used to check the overall function of your heart muscle. You may have a coronary angiogram if:  You are having  chest pain, or other symptoms of angina, and you are at risk for heart disease.  You have an abnormal electrocardiogram (ECG) or stress test.  You have chest pain and heart failure.  You are having irregular heart rhythms.  You and your health care provider determine that the benefits of the test information outweigh the risks of  the procedure.  Let your health care provider know about:  Any allergies you have, including allergies to contrast dye.  All medicines you are taking, including vitamins, herbs, eye drops, creams, and over-the-counter medicines.  Any problems you or family members have had with anesthetic medicines.  Any blood disorders you have.  Any surgeries you have had.  History of kidney problems or kidney failure.  Any medical conditions you have.  Whether you are pregnant or may be pregnant. What are the risks? Generally, this is a safe procedure. However, problems may occur, including:  Infection.  Allergic reaction to medicines or dyes that are used.  Bleeding from the access site or other locations.  Kidney injury, especially in people with impaired kidney function.  Stroke (rare).  Heart attack (rare).  Damage to other structures or organs.  What happens before the procedure? Staying hydrated Follow instructions from your health care provider about hydration, which may include:  Up to 2 hours before the procedure - you may continue to drink clear liquids, such as water, clear fruit juice, black coffee, and plain tea.  Eating and drinking restrictions Follow instructions from your health care provider about eating and drinking, which may include:  8 hours before the procedure - stop eating heavy meals or foods such as meat, fried foods, or fatty foods.  6 hours before the procedure - stop eating light meals or foods, such as toast or cereal.  2 hours before the procedure - stop drinking clear liquids.  General instructions  Ask your health care provider about: ? Changing or stopping your regular medicines. This is especially important if you are taking diabetes medicines or blood thinners. ? Taking medicines such as ibuprofen. These medicines can thin your blood. Do not take these medicines before your procedure if your health care provider instructs you not to,  though aspirin may be recommended prior to coronary angiograms.  Plan to have someone take you home from the hospital or clinic.  You may need to have blood tests or X-rays done. What happens during the procedure?  An IV tube will be inserted into one of your veins.  You will be given one or more of the following: ? A medicine to help you relax (sedative). ? A medicine to numb the area where the catheter will be inserted into an artery (local anesthetic).  To reduce your risk of infection: ? Your health care team will wash or sanitize their hands. ? Your skin will be washed with soap. ? Hair may be removed from the area where the catheter will be inserted.  You will be connected to a continuous ECG monitor.  The catheter will be inserted into an artery. The location may be in your groin, in your wrist, or in the fold of your arm (near your elbow).  A type of X-ray (fluoroscopy) will be used to help guide the catheter to the opening of the blood vessel that is being examined.  A dye will be injected into the catheter, and X-rays will be taken. The dye will help to show where any narrowing or blockages are located in the heart arteries.  Tell your health care provider if you have any chest pain or trouble breathing during the procedure.  If blockages are found, your health care provider may perform another procedure, such as inserting a coronary stent. The procedure may vary among health care providers and hospitals. What happens after the procedure?  After the procedure, you will need to keep the area still for a few hours, or for as long as told by your health care provider. If the procedure is done through the groin, you will be instructed to not bend and not cross your legs.  The insertion site will be checked frequently.  The pulse in your foot or wrist will be checked frequently.  You may have additional blood tests, X-rays, and a test that records the electrical activity of  your heart (ECG).  Do not drive for 24 hours if you were given a sedative. Summary  A coronary angiogram is an X-ray procedure that is used to look into the arteries in the heart.  During the procedure, a dye (contrast dye) is injected through a long, thin tube (catheter). The catheter is inserted through the groin, wrist, or arm.  Tell your health care provider about any allergies you have, including allergies to contrast dye.  After the procedure, you will need to keep the area still for a few hours, or for as long as told by your health care provider. This information is not intended to replace advice given to you by your health care provider. Make sure you discuss any questions you have with your health care provider. Document Released: 10/25/2002 Document Revised: 01/31/2016 Document Reviewed: 01/31/2016 Elsevier Interactive Patient Education  [employer].

## 2017-06-12 LAB — BASIC METABOLIC PANEL
BUN/Creatinine Ratio: 17 (ref 10–24)
BUN: 15 mg/dL (ref 8–27)
CO2: 21 mmol/L (ref 20–29)
Calcium: 9.3 mg/dL (ref 8.6–10.2)
Chloride: 106 mmol/L (ref 96–106)
Creatinine, Ser: 0.87 mg/dL (ref 0.76–1.27)
GFR calc Af Amer: 105 mL/min/{1.73_m2} (ref >59)
GFR calc non Af Amer: 91 mL/min/{1.73_m2} (ref >59)
Glucose: 105 mg/dL — ABNORMAL HIGH (ref 65–99)
Potassium: 4.6 mmol/L (ref 3.5–5.2)
Sodium: 144 mmol/L (ref 134–144)

## 2017-06-12 LAB — CBC
Hematocrit: 38.8 % (ref 37.5–51.0)
Hemoglobin: 13.1 g/dL (ref 13.0–17.7)
MCH: 32.5 pg (ref 26.6–33.0)
MCHC: 33.8 g/dL (ref 31.5–35.7)
MCV: 96 fL (ref 79–97)
Platelets: 235 10*3/uL (ref 150–379)
RBC: 4.03 x10E6/uL — ABNORMAL LOW (ref 4.14–5.80)
RDW: 14.7 % (ref 12.3–15.4)
WBC: 7.6 10*3/uL (ref 3.4–10.8)

## 2017-06-12 LAB — PROTIME-INR
INR: 1 (ref 0.8–1.2)
Prothrombin Time: 10.4 s (ref 9.1–12.0)

## 2017-06-15 ENCOUNTER — Other Ambulatory Visit: Payer: 59

## 2017-06-21 ENCOUNTER — Telehealth: Payer: Self-pay | Admitting: *Deleted

## 2017-06-21 NOTE — Telephone Encounter (Signed)
Catheterization scheduled at [facility] for: Wednesday June 23, 2017 at 8:30 AM Arrival time and place: [place] at: 6:30 AM Nothing to eat or drink after midnight the night before the cath.  Hold: No Janumet 06/22/17, 06/23/17, and 48 hours post cath. No Starlix am of cath.  Except hold medications AM meds can be  taken pre-cath with sip of water including: ASA 81 mg am of cath.  Patient has responsible person to drive home post procedure and observe patient for 24 hours   LMTCB for pt to discuss cath instructions

## 2017-06-22 NOTE — Telephone Encounter (Signed)
I spoke with patient, verified and discussed cath instructions with patient, he verbalized understanding.

## 2017-06-23 ENCOUNTER — Encounter (HOSPITAL_COMMUNITY)
Admission: RE | Disposition: A | Discharge status: Home / Self Care | Payer: Self-pay | Source: Ambulatory Visit | Attending: Cardiology

## 2017-06-23 ENCOUNTER — Ambulatory Visit (HOSPITAL_COMMUNITY)
Admission: RE | Admit: 2017-06-23 | Discharge: 2017-06-23 | Disposition: A | Discharge status: Home / Self Care | Payer: 59 | Source: Ambulatory Visit | Attending: Cardiology | Admitting: Cardiology

## 2017-06-23 ENCOUNTER — Encounter (HOSPITAL_COMMUNITY): Payer: Self-pay | Admitting: Cardiology

## 2017-06-23 DIAGNOSIS — E119 Type 2 diabetes mellitus without complications: Secondary | ICD-10-CM | POA: Diagnosis not present

## 2017-06-23 DIAGNOSIS — I2584 Coronary atherosclerosis due to calcified coronary lesion: Secondary | ICD-10-CM | POA: Diagnosis not present

## 2017-06-23 DIAGNOSIS — Z79899 Other long term (current) drug therapy: Secondary | ICD-10-CM | POA: Insufficient documentation

## 2017-06-23 DIAGNOSIS — M1990 Unspecified osteoarthritis, unspecified site: Secondary | ICD-10-CM | POA: Insufficient documentation

## 2017-06-23 DIAGNOSIS — Z955 Presence of coronary angioplasty implant and graft: Secondary | ICD-10-CM

## 2017-06-23 DIAGNOSIS — E785 Hyperlipidemia, unspecified: Secondary | ICD-10-CM | POA: Insufficient documentation

## 2017-06-23 DIAGNOSIS — I428 Other cardiomyopathies: Secondary | ICD-10-CM

## 2017-06-23 DIAGNOSIS — I25118 Atherosclerotic heart disease of native coronary artery with other forms of angina pectoris: Secondary | ICD-10-CM | POA: Diagnosis not present

## 2017-06-23 DIAGNOSIS — R0600 Dyspnea, unspecified: Secondary | ICD-10-CM | POA: Diagnosis present

## 2017-06-23 DIAGNOSIS — I429 Cardiomyopathy, unspecified: Secondary | ICD-10-CM | POA: Diagnosis not present

## 2017-06-23 DIAGNOSIS — Z888 Allergy status to other drugs, medicaments and biological substances status: Secondary | ICD-10-CM | POA: Diagnosis not present

## 2017-06-23 DIAGNOSIS — R9389 Abnormal findings on diagnostic imaging of other specified body structures: Secondary | ICD-10-CM | POA: Diagnosis not present

## 2017-06-23 DIAGNOSIS — Z8249 Family history of ischemic heart disease and other diseases of the circulatory system: Secondary | ICD-10-CM | POA: Insufficient documentation

## 2017-06-23 DIAGNOSIS — Z7982 Long term (current) use of aspirin: Secondary | ICD-10-CM | POA: Insufficient documentation

## 2017-06-23 DIAGNOSIS — R0609 Other forms of dyspnea: Secondary | ICD-10-CM

## 2017-06-23 HISTORY — PX: CORONARY PRESSURE/FFR STUDY: CATH118243

## 2017-06-23 HISTORY — PX: LEFT HEART CATH AND CORONARY ANGIOGRAPHY: CATH118249

## 2017-06-23 HISTORY — DX: Atherosclerotic heart disease of native coronary artery without angina pectoris: I25.10

## 2017-06-23 HISTORY — PX: CORONARY STENT INTERVENTION: CATH118234

## 2017-06-23 HISTORY — DX: Abnormal findings on diagnostic imaging of other specified body structures: R93.89

## 2017-06-23 HISTORY — PX: INTRAVASCULAR PRESSURE WIRE/FFR STUDY: CATH118243

## 2017-06-23 LAB — POCT ACTIVATED CLOTTING TIME
Activated Clotting Time: 307 seconds
Activated Clotting Time: 252 seconds

## 2017-06-23 LAB — GLUCOSE, CAPILLARY
Glucose-Capillary: 142 mg/dL — ABNORMAL HIGH (ref 65–99)
Glucose-Capillary: 135 mg/dL — ABNORMAL HIGH (ref 65–99)

## 2017-06-23 SURGERY — LEFT HEART CATH AND CORONARY ANGIOGRAPHY
Anesthesia: LOCAL

## 2017-06-23 MED ORDER — NITROGLYCERIN 1 MG/10 ML FOR IR/CATH LAB
INTRA_ARTERIAL | Status: AC
  Filled 2017-06-23: qty 10

## 2017-06-23 MED ORDER — ASPIRIN EC 81 MG PO TBEC: 81.0000 mg | DELAYED_RELEASE_TABLET | Freq: Every day | ORAL | Status: DC

## 2017-06-23 MED ORDER — SODIUM CHLORIDE 0.9 % WEIGHT BASED INFUSION: 1.0000 mL/kg/h | INTRAVENOUS | Status: DC

## 2017-06-23 MED ORDER — HEPARIN (PORCINE) IN NACL 2-0.9 UNIT/ML-% IJ SOLN
INTRAMUSCULAR | Status: AC | PRN
  Administered 2017-06-23: 500 mL via INTRA_ARTERIAL

## 2017-06-23 MED ORDER — ADENOSINE (DIAGNOSTIC) 140MCG/KG/MIN
INTRAVENOUS | Status: DC | PRN
Start: 2017-06-23 — End: 2017-06-23
  Administered 2017-06-23: 140 ug/kg/min via INTRAVENOUS

## 2017-06-23 MED ORDER — HEPARIN (PORCINE) IN NACL 2-0.9 UNIT/ML-% IJ SOLN
INTRAMUSCULAR | Status: AC | PRN
  Administered 2017-06-23: 500 mL

## 2017-06-23 MED ORDER — CLOPIDOGREL BISULFATE 75 MG PO TABS: 75.0000 mg | ORAL_TABLET | Freq: Every day | ORAL | 2 refills | Status: DC

## 2017-06-23 MED ORDER — MIDAZOLAM HCL 2 MG/2ML IJ SOLN
INTRAMUSCULAR | Status: AC
  Filled 2017-06-23: qty 2

## 2017-06-23 MED ORDER — IOPAMIDOL (ISOVUE-370) INJECTION 76%
INTRAVENOUS | Status: AC
  Filled 2017-06-23: qty 100

## 2017-06-23 MED ORDER — FENTANYL CITRATE (PF) 100 MCG/2ML IJ SOLN
INTRAMUSCULAR | Status: AC
  Filled 2017-06-23: qty 2

## 2017-06-23 MED ORDER — ALBUTEROL SULFATE HFA 108 (90 BASE) MCG/ACT IN AERS: 1.0000 | INHALATION_SPRAY | Freq: Four times a day (QID) | RESPIRATORY_TRACT | Status: DC | PRN

## 2017-06-23 MED ORDER — MIDAZOLAM HCL 2 MG/2ML IJ SOLN
INTRAMUSCULAR | Status: DC | PRN
  Administered 2017-06-23 (×2): 1 mg via INTRAVENOUS

## 2017-06-23 MED ORDER — FENTANYL CITRATE (PF) 100 MCG/2ML IJ SOLN
INTRAMUSCULAR | Status: DC | PRN
  Administered 2017-06-23 (×2): 25 ug via INTRAVENOUS

## 2017-06-23 MED ORDER — VERAPAMIL HCL 2.5 MG/ML IV SOLN
INTRAVENOUS | Status: DC | PRN
  Administered 2017-06-23: 09:00:00 via INTRA_ARTERIAL

## 2017-06-23 MED ORDER — SODIUM CHLORIDE 0.9 % IV SOLN: INTRAVENOUS | Status: AC

## 2017-06-23 MED ORDER — SODIUM CHLORIDE 0.9 % WEIGHT BASED INFUSION
3.0000 mL/kg/h | INTRAVENOUS | Status: AC
  Administered 2017-06-23: 3 mL/kg/h via INTRAVENOUS

## 2017-06-23 MED ORDER — HEPARIN (PORCINE) IN NACL 2-0.9 UNIT/ML-% IJ SOLN
INTRAMUSCULAR | Status: AC
  Filled 2017-06-23: qty 1000

## 2017-06-23 MED ORDER — LABETALOL HCL 5 MG/ML IV SOLN: 10.0000 mg | INTRAVENOUS | Status: DC | PRN

## 2017-06-23 MED ORDER — HEPARIN SODIUM (PORCINE) 1000 UNIT/ML IJ SOLN
INTRAMUSCULAR | Status: AC
  Filled 2017-06-23: qty 1

## 2017-06-23 MED ORDER — ASPIRIN 81 MG PO CHEW: 81.0000 mg | CHEWABLE_TABLET | ORAL | Status: DC

## 2017-06-23 MED ORDER — NITROGLYCERIN 0.4 MG SL SUBL: 0.4000 mg | SUBLINGUAL_TABLET | SUBLINGUAL | 2 refills | Status: DC | PRN

## 2017-06-23 MED ORDER — ONDANSETRON HCL 4 MG/2ML IJ SOLN: 4.0000 mg | Freq: Four times a day (QID) | INTRAMUSCULAR | Status: DC | PRN

## 2017-06-23 MED ORDER — SODIUM CHLORIDE 0.9 % IV SOLN: 250.0000 mL | INTRAVENOUS | Status: DC | PRN

## 2017-06-23 MED ORDER — ADENOSINE 12 MG/4ML IV SOLN
INTRAVENOUS | Status: AC
  Filled 2017-06-23: qty 16

## 2017-06-23 MED ORDER — CLOPIDOGREL BISULFATE 300 MG PO TABS
ORAL_TABLET | ORAL | Status: AC
  Filled 2017-06-23: qty 2

## 2017-06-23 MED ORDER — IOPAMIDOL (ISOVUE-370) INJECTION 76%
INTRAVENOUS | Status: DC | PRN
  Administered 2017-06-23: 103 mL via INTRA_ARTERIAL

## 2017-06-23 MED ORDER — CLOPIDOGREL BISULFATE 300 MG PO TABS
ORAL_TABLET | ORAL | Status: DC | PRN
  Administered 2017-06-23: 600 mg via ORAL

## 2017-06-23 MED ORDER — LIDOCAINE HCL (PF) 1 % IJ SOLN
INTRAMUSCULAR | Status: AC
  Filled 2017-06-23: qty 30

## 2017-06-23 MED ORDER — ACETAMINOPHEN 325 MG PO TABS: 650.0000 mg | ORAL_TABLET | ORAL | Status: DC | PRN

## 2017-06-23 MED ORDER — HEPARIN SODIUM (PORCINE) 1000 UNIT/ML IJ SOLN
INTRAMUSCULAR | Status: DC | PRN
  Administered 2017-06-23 (×2): 4500 [IU] via INTRAVENOUS

## 2017-06-23 MED ORDER — CLOPIDOGREL BISULFATE 75 MG PO TABS: 75.0000 mg | ORAL_TABLET | Freq: Every day | ORAL | 0 refills | Status: DC

## 2017-06-23 MED ORDER — SODIUM CHLORIDE 0.9% FLUSH: 3.0000 mL | INTRAVENOUS | Status: DC | PRN

## 2017-06-23 MED ORDER — VERAPAMIL HCL 2.5 MG/ML IV SOLN
INTRAVENOUS | Status: AC
  Filled 2017-06-23: qty 2

## 2017-06-23 MED ORDER — MORPHINE SULFATE (PF) 4 MG/ML IV SOLN: 2.0000 mg | INTRAVENOUS | Status: DC | PRN

## 2017-06-23 MED ORDER — HYDRALAZINE HCL 20 MG/ML IJ SOLN: 5.0000 mg | INTRAMUSCULAR | Status: DC | PRN

## 2017-06-23 MED ORDER — PRAVASTATIN SODIUM 20 MG PO TABS: 20.0000 mg | ORAL_TABLET | Freq: Every day | ORAL | Status: DC

## 2017-06-23 MED ORDER — SODIUM CHLORIDE 0.9% FLUSH: 3.0000 mL | Freq: Two times a day (BID) | INTRAVENOUS | Status: DC

## 2017-06-23 MED ORDER — CLOPIDOGREL BISULFATE 75 MG PO TABS: 75.0000 mg | ORAL_TABLET | Freq: Every day | ORAL | Status: DC

## 2017-06-23 MED ORDER — LIDOCAINE HCL (PF) 1 % IJ SOLN
INTRAMUSCULAR | Status: DC | PRN
  Administered 2017-06-23: 2 mL

## 2017-06-23 MED FILL — CLOPIDOGREL 75 MG TABLET: 75 | 30 days supply | Qty: 30 | Fill #0

## 2017-06-23 MED FILL — NITROGLYCERIN 0.4 MG TAB SL: 0.4 | 7 days supply | Qty: 25 | Fill #0

## 2017-06-23 SURGICAL SUPPLY — 23 items
BALLN SAPPHIRE 2.25X10 (BALLOONS) ×2
BALLN SAPPHIRE ~~LOC~~ 3.0X12 (BALLOONS) ×2 IMPLANT
BALLOON SAPPHIRE 2.25X10 (BALLOONS) ×1 IMPLANT
CATH MICROCATH NAVVUS (MICROCATHETER) ×1 IMPLANT
CATH OPTITORQUE TIG 4.0 5F (CATHETERS) ×2 IMPLANT
CATH VISTA GUIDE 6FR XBLAD3.5 (CATHETERS) ×2 IMPLANT
COVER PRB 48X5XTLSCP FOLD TPE (BAG) ×1 IMPLANT
COVER PROBE 5X48 (BAG) ×1
DEVICE RAD COMP TR BAND LRG (VASCULAR PRODUCTS) ×2 IMPLANT
GLIDESHEATH SLEND A-KIT 6F 22G (SHEATH) ×2 IMPLANT
GUIDEWIRE INQWIRE 1.5J.035X260 (WIRE) ×1 IMPLANT
INQWIRE 1.5J .035X260CM (WIRE) ×2
KIT HEART LEFT (KITS) ×2 IMPLANT
KIT HEMO VALVE WATCHDOG (MISCELLANEOUS) ×2 IMPLANT
KIT PREMIUM HAND CONTROLLER (KITS) ×2 IMPLANT
KIT SINGLE USE MANIFOLD (KITS) ×2 IMPLANT
KIT SYR MULTI USE (KITS) ×2 IMPLANT
MICROCATHETER NAVVUS (MICROCATHETER) ×2
PACK CARDIAC CATHETERIZATION (CUSTOM PROCEDURE TRAY) ×2 IMPLANT
STENT SYNERGY DES 2.5X16 (Permanent Stent) ×2 IMPLANT
TRANSDUCER W/STOPCOCK (MISCELLANEOUS) ×2 IMPLANT
TUBING CIL FLEX 10 FLL-RA (TUBING) ×2 IMPLANT
WIRE ASAHI PROWATER 180CM (WIRE) ×2 IMPLANT

## 2017-06-23 NOTE — Discharge Summary (Signed)
Discharge Summary/SAME DAY PCI    Patient ID: Reginald Rios,  MRN: [id number], DOB/AGE: 04-Sep-1955 62 y.o.  Admit date: 06/23/2017 Discharge date: 06/23/2017  Primary Care Provider: Vernie Shanks Primary Cardiologist: Dr. Meda Coffee   Discharge Diagnoses    Principal Problem:   Abnormal findings on diagnostic imaging of cardiovascular system Active Problems:   Nonischemic cardiomyopathy (HCC)   Dyspnea on exertion   Allergies Allergies  Allergen Reactions  . Invokana [Canagliflozin] Rash  . Statins Rash and Other (See Comments)    Muscle aches     Diagnostic Studies/Procedures    Cath: 06/23/17  Conclusion     CULPRIT LESION mid Cx to Dist Cx lesion is 80% stenosed.  A drug-eluting stent was successfully placed using a STENT SYNERGY DES 2.5X16. Postdilated and taper fashion from 3.1-2.6 mm  Post intervention, there is a 0% residual stenosis.  Prox LAD to Mid LAD lesion is 60% stenosed. --FFR 0.83. Not significant.  The ostial-proximal LAD and Circumflex have mild-moderate calcification without significant stenosis.  OM1 is a relatively small caliber vessel after gives off a branch and is not a good PCI target. No obvious lesions were noted to explain the FFR positive findings.   Successful PCI to distal circumflex likely culprit lesion reducing 80% to 0% with a Synergy DES stent postdilated in tapered fashion. FFR not significant lesion of the LAD medical management -this correlates with CTA   Plan: Same day discharge after bed rest and TR band removal DAPT for minimum of 6 months, then okay to stop aspirin but continue Plavix for at least one year. Continue aggressive risk factor modification with lipid management.  Hold Janumet x 48 hr post Cath  Glenetta Hew, M.D., M.S. Interventional Cardiologist   _____________   History of Present Illness     62 y.o. male who presented for results of abnormal cardiac CTA and discuss cardiac cath. He felt  more tired than he used to and was unable to play as many sets as he used to. He has also noticed that when hiking and mountain he became short of breath. His father had a myocardial infarction followed by death at age of 43.He was switched from atorvastatin to pravastatin with muscle pain but when he started totake coenzyme Q 10has resolved as well as his discopathy. With the fatigue Echo and cardiac CTA were ordered.  Echo with EF 50-55% unchanged, no pulmonary HTN  1. Left Main: No significant stenosis.  2. LAD: No significant stenosis. 3. D1: No significant stenosis. 4. LCX: Proximal: 0.89, mid LCX: 0.85, distal LCX: 0.80. 5. OM1: Proximal: 0.83, distal: 0.77. 6. RCA: Small non-dominant.  IMPRESSION: 1. CT FFR analysis showed significant stenoses in the mid LCX and proximal OM1 arteries. A cardiac catheterization is recommended.  No acute extra cardiac abnormality. Does have Fatty infiltration of the liver.    With the blockage in the LCX, Dr. Meda Coffee would like to proceed with cardiac cath to further eval and treat if needed with stent.  Discussed cath in detail with pt and wife. Risks included. The patient was willing to proceed.    Hospital Course     Underwent cardiac cath noted above with successful PCI to the dLCx, with FFR to the lesion in the LAD that was 0.83. Plan for DAPT with ASA/plavix. Seen by cardiac rehab while in short stay. Instructions/precautions given prior to discharge. Radial cath site stable.    Reginald Rios was seen by Dr. Ellyn Hack and  determined stable for discharge home. Follow up in the office has been arranged. Medications are listed below.   _____________  Discharge Vitals Blood pressure 116/60, pulse (!) 58, temperature 97.7 F (36.5 C), temperature source Oral, resp. rate 14, height 5\' 10"  (1.778 m), weight 192 lb (87.1 kg), SpO2 98 %.  Filed Weights   06/23/17 0650  Weight: 192 lb (87.1 kg)    Labs & Radiologic Studies    CBC No  results for input(s): WBC, NEUTROABS, HGB, HCT, MCV, PLT in the last 72 hours. Basic Metabolic Panel No results for input(s): NA, K, CL, CO2, GLUCOSE, BUN, CREATININE, CALCIUM, MG, PHOS in the last 72 hours. Liver Function Tests No results for input(s): AST, ALT, ALKPHOS, BILITOT, PROT, ALBUMIN in the last 72 hours. No results for input(s): LIPASE, AMYLASE in the last 72 hours. Cardiac Enzymes No results for input(s): CKTOTAL, CKMB, CKMBINDEX, TROPONINI in the last 72 hours. BNP Invalid input(s): POCBNP D-Dimer No results for input(s): DDIMER in the last 72 hours. Hemoglobin A1C No results for input(s): HGBA1C in the last 72 hours. Fasting Lipid Panel No results for input(s): CHOL, HDL, LDLCALC, TRIG, CHOLHDL, LDLDIRECT in the last 72 hours. Thyroid Function Tests No results for input(s): TSH, T4TOTAL, T3FREE, THYROIDAB in the last 72 hours.  Invalid input(s): FREET3 _____________  Ct Coronary Morph W/cta Cor W/score W/ca W/cm &/or Wo/cm  Addendum Date: 06/08/2017   ADDENDUM REPORT: 06/08/2017 17:54 CLINICAL DATA:  6 -year-old male with fatigue and exertional dyspnea. EXAM: Cardiac/Coronary  CT TECHNIQUE: The patient was scanned on a [employer]. FINDINGS: A 120 kV prospective scan was triggered in the descending thoracic aorta at 111 HU's. Axial non-contrast 3 mm slices were carried out through the heart. The data set was analyzed on a dedicated work station and scored using the [employer] [place]. Gantry rotation speed was 250 msecs and collimation was .6 mm. No beta blockade and 0.8 mg of sl NTG was given. The 3D data set was reconstructed in 5% intervals of the 67-82 % of the R-R cycle. Diastolic phases were analyzed on a dedicated work station using MPR, MIP and VRT modes. The patient received 80 cc of contrast. Aorta:  Normal size.  No calcifications.  No dissection. Aortic Valve:  Trileaflet.  No calcifications. Coronary Arteries:  Normal coronary origin.  Left dominance. Left  main is a large artery that gives rise to LAD and LCX arteries. LM has no plaque. LAD is a large vessel that gives rise to two diagonal arteries. Proximal LADS has moderate diffuse almost circumferential plaque with stenosis 25-50% and a focal stenosis of 50-69% at the takeoff of second diagonal artery. Mid LAD has a moderate diffuse predominantly calcified plaque that is almost circumferential with stenosis 50-69%. D1 is very small with no obvious plaque. D2 has a moderate ostial calcified lesion with focal stenosis 50-69%. LCX is a large dominant artery that gives rise to two OM branches, PDA and PLA arteries. Proximal LCX artery has mild calcified plaque with associated stenosis 25-50%. Mid LCX artery has a moderate mixed, predominantly calcified almost circumferential plaque with associated stenosis 50-69% and possibly focal > 70% stenosis. OM1 is a very small artery with mild plaque. OM2 is a medium size artery with moderate ostial proximal calcified plaque with associated stenosis 50-69%. PDA is small with limited visualization, no obvious plaque is seen. PLA is very small and poorly visualized. RCA is a small non-dominant artery that has mild calcified plaque in the proximal portion with  associated stenosis 25-50%. Other findings: Normal pulmonary vein drainage into the left atrium. Normal let atrial appendage without a thrombus. Dilated pulmonary artery measuring 32 mm. IMPRESSION: 1. Coronary calcium score of 1715. This was 75 percentile for age and sex matched control. 2. Normal coronary origin with left dominance. 3. Moderate diffuse plaque in the proximal and mid LAD, proximal second diagonal artery, mid LCX and proximal OM2 branch. There are severe diffuse calcifications that are affecting interpretation. Additional analysis with CT FFR will be performed. 4. Dilated pulmonary artery measuring 32 mm suspicious for pulmonary hypertension. Electronically Signed   By: Ena Dawley   On: 06/08/2017 17:54    Result Date: 06/08/2017 EXAM: OVER-READ INTERPRETATION  CT CHEST The following report is an over-read performed by radiologist Dr. Rolm Baptise of [facility], [place] on 06/08/2017. This over-read does not include interpretation of cardiac or coronary anatomy or pathology. The coronary CTA interpretation by the cardiologist is attached. COMPARISON:  None. FINDINGS: Vascular: Aorta is normal caliber.  Heart is normal size. Mediastinum/Nodes: No adenopathy in the lower mediastinum or hila. Lungs/Pleura: Visualized lungs clear.  No effusions. Upper Abdomen: Suspect fatty infiltration of the liver. Musculoskeletal: Chest wall soft tissues are unremarkable. No acute bony abnormality. IMPRESSION: No acute extra cardiac abnormality. Fatty infiltration of the liver. Electronically Signed: By: Rolm Baptise M.D. On: 06/08/2017 09:55   Ct Coronary Fractional Flow Reserve Data Prep  Result Date: 06/08/2017 EXAM: FF/RCT ANALYSIS FINDINGS: FFRct analysis was performed on the original cardiac CT angiogram dataset. Diagrammatic representation of the FFRct analysis is provided in a separate PDF document in PACS. This dictation was created using the PDF document and an interactive 3D model of the results. 3D model is not available in the EMR/PACS. Normal FFR range is >0.80. 1. Left Main:  No significant stenosis. 2. LAD: No significant stenosis. 3. D1: No significant stenosis. 4. LCX: Proximal: 0.89, mid LCX: 0.85, distal LCX: 0.80. 5. OM1: Proximal: 0.83, distal: 0.77. 6. RCA: Small non-dominant. IMPRESSION: 1. CT FFR analysis showed significant stenoses in the mid LCX and proximal OM1 arteries. A cardiac catheterization is recommended. Electronically Signed   By: Ena Dawley   On: 06/08/2017 17:58   Disposition   Pt is being discharged home today in good condition.  Follow-up Plans & Appointments    Follow-up Information    Imogene Burn, PA-C Follow up on 07/06/2017.   Specialty:  Cardiology Why:  at  8:30am for your follow up appt.  Contact information: [address] [place] [address] [phone]          Discharge Instructions    Amb Referral to Cardiac Rehabilitation   Complete by:  As directed    Referring to [place] CRP 2   Diagnosis:  Coronary Stents      Discharge Medications     Medication List    STOP taking these medications   meloxicam 7.5 MG tablet Commonly known as:  MOBIC   tadalafil 10 MG tablet Commonly known as:  CIALIS   testosterone 50 MG/5GM (1%) Gel Commonly known as:  ANDROGEL     TAKE these medications   aspirin EC 81 MG tablet Take 81 mg by mouth daily.   clopidogrel 75 MG tablet Commonly known as:  PLAVIX Take 1 tablet (75 mg total) by mouth daily with breakfast. Start taking on:  06/24/2017   CoQ10 200 MG Caps Take 200 mg by mouth daily.   gabapentin 100 MG capsule Commonly  known as:  NEURONTIN Take 1 capsule (100 mg total) by mouth at bedtime.   JANUMET 50-1000 MG tablet Generic drug:  sitaGLIPtin-metformin TAKE 1 TABLET BY MOUTH TWICE DAILY WITH A MEAL What changed:  See the new instructions.   methylphenidate 20 MG tablet Commonly known as:  RITALIN Take 20 mg by mouth daily.   montelukast 10 MG tablet Commonly known as:  SINGULAIR Take 1 tablet (10 mg total) by mouth daily.   nateglinide 120 MG tablet Commonly known as:  STARLIX Take 1 tablet (120 mg total) by mouth daily. What changed:    when to take this  reasons to take this   nitroGLYCERIN 0.4 MG SL tablet Commonly known as:  NITROSTAT Place 1 tablet (0.4 mg total) under the tongue every 5 (five) minutes as needed.   pravastatin 20 MG tablet Commonly known as:  PRAVACHOL Take 20 mg by mouth daily.   TURMERIC PO Take 1 capsule by mouth daily.   valsartan 40 MG tablet Commonly known as:  DIOVAN Take 1 tablet (40 mg total) by mouth daily.   VENTOLIN HFA 108 (90 Base) MCG/ACT inhaler Generic drug:  albuterol Inhale 1 puff  into the lungs every 6 (six) hours as needed (cough).   Vitamin D (Ergocalciferol) 50000 units Caps capsule Commonly known as:  DRISDOL Take 1 capsule (50,000 Units total) by mouth every 7 (seven) days.        Aspirin prescribed at discharge?  Yes High Intensity Statin Prescribed? (Lipitor 40-80mg  or Crestor 20-40mg ): No: Statin intolerance in the past. Consider Lipid clinic referral.  Beta Blocker Prescribed? No: Bradycardia For EF <40%, was ACEI/ARB Prescribed? No: EF ok ADP Receptor Inhibitor Prescribed? (i.e. Plavix etc.-Includes Medically Managed Patients): Yes For EF <40%, Aldosterone Inhibitor Prescribed? No: EF ok Was EF assessed during THIS hospitalization? No echo 05/05/17 Was Cardiac Rehab II ordered? (Included Medically managed Patients): Yes   Outstanding Labs/Studies   N/a   Duration of Discharge Encounter   Greater than 30 minutes including physician time.  Signed, Reino Bellis NP-C 06/23/2017, 2:49 PM

## 2017-06-23 NOTE — Discharge Instructions (Signed)

## 2017-06-23 NOTE — Interval H&P Note (Signed)
History and Physical Interval Note:  06/23/2017 8:31 AM  Youlanda Roys Clucas  has presented today for surgery, with the diagnosis of Abnormal CV ImagingTest  The various methods of treatment have been discussed with the patient and family. After consideration of risks, benefits and other options for treatment, the patient has consented to  Procedure(s): LEFT HEART CATH AND CORONARY ANGIOGRAPHY (N/A) with possible PERCUTANEOUS CORONARY INTERVENTION as a surgical intervention .  The patient's history has been reviewed, patient examined, no change in status, stable for surgery.  I have reviewed the patient's chart and labs.  Questions were answered to the patient's satisfaction.    Cath Lab Visit (complete for each Cath Lab visit)  Clinical Evaluation Leading to the Procedure:   ACS: No.  Non-ACS:    Anginal Classification: CCS II  Anti-ischemic medical therapy: Minimal Therapy (1 class of medications)  Non-Invasive Test Results: Intermediate-risk stress test findings: cardiac mortality 1-3%/year  Prior CABG: No previous CABG    Glenetta Hew

## 2017-06-23 NOTE — Brief Op Note (Signed)
    06/23/2017  10:40 AM   BRIEF CARDIAC CATH/PCI NOTE  PATIENT:  Reginald Rios  62 y.o. male presenting for Cardiac Cath 2/2 Abnormal Cor CTA - FFR + Cx.  PRE-OPERATIVE DIAGNOSIS:  Abnormal Stress Test - Cor CTA.  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  Mod calcified mLAD ~60% - FFR 0.83. OM lesions mild - not significant.  PROCEDURE:  Procedure(s): LEFT HEART CATH AND CORONARY ANGIOGRAPHY (N/A) INTRAVASCULAR PRESSURE WIRE/FFR STUDY (N/A) CORONARY STENT INTERVENTION (N/A)  SURGEON:  Surgeon(s) and Role:    * Leonie Man, MD - Primary  ANESTHESIA:   local and IV sedation Lidocaine. 2:50 Versed /Fentanyl  EBL:  <<90mL   MEDICATIONS USED:  Plavix 600 mg, 9000 IV Heparin.  103 mL contrast; 2 min of Adenosine infusion.  PROCEDURE: 6Fr R Radial Access - US Guided.  5 Fr TIG 4.0 Cath fo rR&LCA Angio & LV Pressures.  PCI: 6 Fr  XBLAD Guide, Prowater wire - 2.5 x 16 Synergy DES dCx (post-dilated 3.1-2.6 mm).    FFR mCx - Prowater wire, Micronavvus Catheter, Adenosine 140 mg/kg/min x 2 min  TOURNIQUET:  TR Band 0945, 52ml  DICTATION: .Note written in EPIC  PLAN OF CARE: Discharge to home after PACU  PATIENT DISPOSITION:  PACU - hemodynamically stable.   Short Stay - Same Day D/c  6 months DAPT - then OK to stop ASA Aggressive RF modification  Hold Janumet x 48 hr post Cath    Glenetta Hew, M.D., M.S. Interventional Cardiologist   Pager # [phone] Phone # [phone] [address]. [place], [place] [address]

## 2017-06-23 NOTE — Progress Notes (Signed)
1120-1215 Education completed with pt and wife who voiced understanding. Stressed importance of plavix with stent. Reviewed NTG use, risk factors, carb counting, ex ed, heart healthy food choices, and CRP 2. Will refer to [place] CRP 2. Graylon Good RN BSN 06/23/2017 12:13 PM

## 2017-06-23 NOTE — Progress Notes (Signed)
TR band removed, gauze with tegaderm placed, armboard placed, site WNL.  Will continue to monitor

## 2017-06-24 MED FILL — Nitroglycerin IV Soln 100 MCG/ML in D5W: INTRA_ARTERIAL | Qty: 10 | Status: AC

## 2017-06-24 MED FILL — Lidocaine HCl Local Inj 1%: INTRAMUSCULAR | Qty: 20 | Status: AC

## 2017-06-29 ENCOUNTER — Telehealth: Payer: Self-pay | Admitting: Cardiology

## 2017-06-29 NOTE — Telephone Encounter (Signed)
   Primary Cardiologist:Katarina Meda Coffee, MD  Chart reviewed as part of pre-operative protocol coverage.  Our office was contacted regarding recommendations for dental cleaning. Patient just underwent PCI 06/23/17 with a drug-eluting stent.  He has follow-up 07/06/17.  Pre-op covering staff: - Recommendations regarding dental cleaning can be made at follow-up appointment scheduled 07/06/17.  Dental cleaning should not be done prior to the patient's follow up appointment.   - Patient does not need antibiotic prophylaxis. - Given recent PCI with drug-eluting stent, the patient cannot come off of aspirin or Plavix for 12 months. - This note will be routed to Ermalinda Barrios, PA-C who sees the patient on 07/06/17 for post PCI follow up.   Richardson Dopp, PA-C  06/29/2017, 4:46 PM

## 2017-06-29 NOTE — Telephone Encounter (Signed)
Mr. Tennyson is calling because he had a cath and stent placed on last Wednesday and he is still feeling very tired and Fatigue . Wants to know is this Normal . Please call   Thanks

## 2017-06-29 NOTE — Telephone Encounter (Signed)
New message     1. What dental office are you calling from? Dr Theone Murdoch    2. What is your office phone and fax number? Fax [phone] ofc [phone]  3. What type of procedure is the patient having performed? Cleaning   4. What date is procedure scheduled or is the patient there now? How long should patient wait since patient had stent placed before he can have cleaning    5. What is your question (ex. Antibiotics prior to procedure, holding medication-we need to know how long dentist wants pt to hold med)?  Does patient need to hold plavix or be pre med?

## 2017-06-29 NOTE — Telephone Encounter (Signed)
Pt discharged from the hospital on 2/20 for s/p coronary artery stent placement.  Pt states he's still fatigued and tired.  Pt has no other complaints from a cardiac perspective.  He has NO SOB, DOE, CP, dizziness, N/V, pre-syncopal or syncopal issues at all.  Pt feels great with his heart, just tired.  Pt states that he could be over-doing it a bit, for he has been at the tennis court, not exercising, but helping repair an old court.  Advised the pt, just as his discharged instructions from the hospital state, that he should not be doing any heavy lifting, bending, pushing, pulling, lifting anything greater than 10 lbs after 5 days post stent.  Advised the pt to rest, relax, take his meds as instructed, avoid all activities he's doing at this time, and hydrate and eat a nutritious diet. Informed the pt that it seems he could be over-exerting himself.  Informed the pt that I will make Dr Meda Coffee aware of his fatigue, and follow-up if she has further recommendations.  Informed the pt that we will see him as scheduled for next week, post-hospital follow-up appt, on 3/5 at 0830 with Estella Husk. Pt verbalized understanding and agrees with this plan.

## 2017-06-30 NOTE — Telephone Encounter (Signed)
Please have him continue to perform light activities for the next 2 weeks, and slowly start exercising afterwards.If he continues to feel this way he should let us know

## 2017-06-30 NOTE — Telephone Encounter (Signed)
Spoke with the pt and informed him of Dr Francesca Oman recommendations.  Advised the pt to continue with scheduled follow-up next week. Advised the pt that if he continues to feel this way he should let us know.  Pt verbalized understanding and agrees with this plan.

## 2017-06-30 NOTE — Telephone Encounter (Signed)
Faxed to requesting office via EPIC

## 2017-07-05 DIAGNOSIS — I2510 Atherosclerotic heart disease of native coronary artery without angina pectoris: Secondary | ICD-10-CM | POA: Insufficient documentation

## 2017-07-05 DIAGNOSIS — Z8249 Family history of ischemic heart disease and other diseases of the circulatory system: Secondary | ICD-10-CM

## 2017-07-05 HISTORY — DX: Family history of ischemic heart disease and other diseases of the circulatory system: Z82.49

## 2017-07-05 NOTE — Progress Notes (Signed)
Cardiology Office Note    Date:  07/06/2017   ID:  Reginald Rios, DOB 1956/04/24, MRN [id number]  PCP:  Vernie Shanks, MD  Cardiologist: Ena Dawley, MD  Chief Complaint  Patient presents with  . Follow-up    History of Present Illness:  Reginald Rios is a 62 y.o. male who was complaining of dyspnea on exertion and increased fatigue when playing tennis.  Father died of an MI at age 32.  He had an abnormal cardiac CT FFR analysis showing significant stenosis in the mid circumflex and proximal OM1.  He underwent successful stenting to the mid distal circumflex 06/23/17.  There is moderate calcified LAD 60% with FFR of 0.83, not felt to be significant.  Patient feels significant fatigue. 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  He said he was totally wiped out.  He says this is unusual for him.  Denies chest pain, dyspnea dizziness or presyncope.  He does feel like his heart is beating out of his chest at times after an activity.   Past Medical History:  Diagnosis Date  . ADD (attention deficit disorder)   . Anemia   . Arthritis   . CAD (coronary artery disease)    2/19 PCI/DESx1 to Lcx  . Diabetes mellitus without complication ([place])   . Exercise-induced asthma   . Heart murmur   . Hyperlipidemia    family hx of high cholesterol  . Hypogonadism in male   . Low back pain   . Osteoarthritis     Past Surgical History:  Procedure Laterality Date  . CORONARY STENT INTERVENTION N/A 06/23/2017   Procedure: CORONARY STENT INTERVENTION;  Surgeon: Leonie Man, MD;  Location: [facility];  Service: Cardiovascular;  Laterality: N/A;  . INTRAVASCULAR PRESSURE WIRE/FFR STUDY N/A 06/23/2017   Procedure: INTRAVASCULAR PRESSURE WIRE/FFR STUDY;  Surgeon: Leonie Man, MD;  Location: [facility];  Service: Cardiovascular;  Laterality: N/A;  . KNEE ARTHROSCOPY    . LEFT HEART CATH AND CORONARY ANGIOGRAPHY  N/A 06/23/2017   Procedure: LEFT HEART CATH AND CORONARY ANGIOGRAPHY;  Surgeon: Leonie Man, MD;  Location: [facility];  Service: Cardiovascular;  Laterality: N/A;  . TENOTOMY ACHILLES TENDON      Current Medications: Current Meds  Medication Sig  . aspirin EC 81 MG tablet Take 81 mg by mouth daily.  . clopidogrel (PLAVIX) 75 MG tablet Take 1 tablet (75 mg total) by mouth daily with breakfast.  . Coenzyme Q10 (COQ10) 200 MG CAPS Take 200 mg by mouth daily.  Marland Kitchen gabapentin (NEURONTIN) 100 MG capsule Take 1 capsule (100 mg total) by mouth at bedtime.  Marland Kitchen JANUMET 50-1000 MG per tablet TAKE 1 TABLET BY MOUTH TWICE DAILY WITH A MEAL  . methylphenidate (RITALIN) 20 MG tablet Take 20 mg by mouth daily.  . nateglinide (STARLIX) 120 MG tablet Take 1 tablet (120 mg total) by mouth daily. (Patient taking differently: Take 120 mg by mouth daily as needed (for blood sugar greater than 150). )  . nitroGLYCERIN (NITROSTAT) 0.4 MG SL tablet Place 1 tablet (0.4 mg total) under the tongue every 5 (five) minutes as needed.  . pravastatin (PRAVACHOL) 20 MG tablet Take 20 mg by mouth daily.  . TURMERIC PO Take 1 capsule by mouth daily.  . VENTOLIN HFA 108 (90 Base) MCG/ACT inhaler Inhale 1 puff into the lungs every 6 (six) hours as needed (  cough).  . [DISCONTINUED] valsartan (DIOVAN) 40 MG tablet Take 1 tablet (40 mg total) by mouth daily.     Allergies:   Invokana [canagliflozin] and Statins   Social History   Socioeconomic History  . Marital status: Married    Spouse name: None  . Number of children: None  . Years of education: None  . Highest education level: None  Social Needs  . Financial resource strain: None  . Food insecurity - worry: None  . Food insecurity - inability: None  . Transportation needs - medical: None  . Transportation needs - non-medical: None  Occupational History  . None  Tobacco Use  . Smoking status: Never Smoker  . Smokeless tobacco: Never Used  Substance  and Sexual Activity  . Alcohol use: Yes    Alcohol/week: 0.0 oz    Comment: rare  . Drug use: No  . Sexual activity: None  Other Topics Concern  . None  Social History Narrative   Previously worked as Geophysicist/field seismologist   Currently working at [employer] clinic   Married 17 years   2 children ages 33, 9   Wife is psychiatrist - Dr. Margurite Auerbach   Moved from [place] in [place]     Family History:  The patient's family history includes Coronary artery disease (age of onset: 52) in his father; Hypothyroidism in his brother; Lung cancer (age of onset: 49) in his mother.   ROS:   Please see the history of present illness.    Review of Systems  Constitution: Positive for malaise/fatigue.  HENT: Negative.   Cardiovascular: Positive for palpitations.  Respiratory: Negative.   Endocrine: Negative.   Hematologic/Lymphatic: Negative.   Musculoskeletal: Negative.   Gastrointestinal: Negative.   Genitourinary: Negative.   Neurological: Negative.    All other systems reviewed and are negative.   PHYSICAL EXAM:   VS:  BP 98/64 (BP Location: Left Arm, Patient Position: Sitting, Cuff Size: Normal)   Pulse 79   Ht 5\' 10"  (1.778 m)   Wt 193 lb (87.5 kg)   SpO2 97%   BMI 27.69 kg/m   Physical Exam  GEN: Well nourished, well developed, in no acute distress  Neck: no JVD, carotid bruits, or masses Cardiac:RRR; no murmurs, rubs, or gallops  Respiratory:  clear to auscultation bilaterally, normal work of breathing GI: soft, nontender, nondistended, + BS Ext: Right arm without hematoma or hemorrhage at cath site.  Good brachial and radial pulses.  Lower extremities without cyanosis, clubbing, or edema, Good distal pulses bilaterally Neuro:  Alert and Oriented x 3,  Psych: euthymic mood, full affect  Wt Readings from Last 3 Encounters:  07/06/17 193 lb (87.5 kg)  06/23/17 192 lb (87.1 kg)  06/11/17 193 lb 12.8 oz (87.9 kg)      Studies/Labs Reviewed:   EKG:  EKG is  ordered  today.  The ekg ordered today demonstrates normal sinus rhythm with PACs  Recent Labs: 04/21/2017: ALT 25; TSH 2.280 06/11/2017: BUN 15; Creatinine, Ser 0.87; Hemoglobin 13.1; Platelets 235; Potassium 4.6; Sodium 144   Lipid Panel    Component Value Date/Time   CHOL 150 04/21/2017 1019   TRIG 114 04/21/2017 1019   HDL 53 04/21/2017 1019   CHOLHDL 2.8 04/21/2017 1019   CHOLHDL 2.7 09/16/2015 1038   VLDL 11 09/16/2015 1038   LDLCALC 74 04/21/2017 1019    Additional studies/ records that were reviewed today include:  PROCEDURE: 6Fr R Radial Access - US Guided.  5  Fr TIG 4.0 Cath fo rR&LCA Angio & LV Pressures.  PCI: 6 Fr  XBLAD Guide, Prowater wire - 2.5 x 16 Synergy DES dCx (post-dilated 3.1-2.6 mm).    FFR mCx - Prowater wire, Micronavvus Catheter, Adenosine 140 mg/kg/min x 2 min   Cath: 06/23/17   Conclusion       CULPRIT LESION mid Cx to Dist Cx lesion is 80% stenosed.  A drug-eluting stent was successfully placed using a STENT SYNERGY DES 2.5X16. Postdilated and taper fashion from 3.1-2.6 mm  Post intervention, there is a 0% residual stenosis.  Prox LAD to Mid LAD lesion is 60% stenosed. --FFR 0.83. Not significant.  The ostial-proximal LAD and Circumflex have mild-moderate calcification without significant stenosis.  OM1 is a relatively small caliber vessel after gives off a branch and is not a good PCI target. No obvious lesions were noted to explain the FFR positive findings.   Successful PCI to distal circumflex likely culprit lesion reducing 80% to 0% with a Synergy DES stent postdilated in tapered fashion. FFR not significant lesion of the LAD medical management -this correlates with CTA          IMPRESSION: 1. CT FFR analysis showed significant stenoses in the mid LCX and proximal OM1 arteries. A cardiac catheterization is recommended.   No acute extra cardiac abnormality. Does have Fatty infiltration of the liver.     With the blockage in the LCX, Dr.  Meda Coffee would like to proceed with cardiac cath to further eval and treat if needed with stent.  Discussed cath in detail with pt and wife. Risks included. The patient was willing to proceed.   PROCEDURE: 6Fr R Radial Access - US Guided.  5 Fr TIG 4.0 Cath fo rR&LCA Angio & LV Pressures.  PCI: 6 Fr  XBLAD Guide, Prowater wire - 2.5 x 16 Synergy DES dCx (post-dilated 3.1-2.6 mm).    FFR mCx - Prowater wire, Micronavvus Catheter, Adenosine 140 mg/kg/min x 2 min   2D eStudy Conclusions   - Procedure narrative: Transthoracic echocardiography. Image   quality was adequate. The study was technically difficult. - Left ventricle: The cavity size was normal. Systolic function was   normal. The estimated ejection fraction was in the range of 50%   to 55%. Wall motion was normal; there were no regional wall   motion abnormalities. Left ventricular diastolic function   parameters were normal. - Aortic valve: Transvalvular velocity was within the normal range.   There was no stenosis. There was no regurgitation. - Mitral valve: Transvalvular velocity was within the normal range.   There was no evidence for stenosis. There was trivial   regurgitation. - Right ventricle: The cavity size was normal. Wall thickness was   normal. Systolic function was normal. - Tricuspid valve: There was trivial regurgitation. - Pulmonary arteries: Systolic pressure was within the normal   range. PA peak pressure: 27 mm Hg (S).   cho 06/23/17   ASSESSMENT:    1. Coronary artery disease involving native coronary artery of native heart without angina pectoris   2. Hyperlipidemia, unspecified hyperlipidemia type   3. Family history of early CAD   71. Palpitations   5. Fatigue, unspecified type      PLAN:  In order of problems listed above:   CAD with recent abnormal CTA/FFR resulting in stenting to the circumflex with residual 60% LAD FFR not significant medical therapy recommended.  Continue Plavix and  aspirin  Hyperlipidemia continue pravastatin  Family history of early CAD  with his father dying at 67 of an MI  Palpitations usually occur after an activity when he feels his heart racing.  He is having frequent PACs.  We will place 48-hour monitor to rule out significant arrhythmia.  Follow-up with Dr. Meda Coffee in 2 weeks.  Fatigue seems to be extreme.  He is very active but has always been active.  I do not think it is coming from his recent cath.  He has normal LV function on echo but was placed on valsartan for low normal.  Blood pressure is low today.  We will decrease valsartan to 20 mg daily to see if this helps.  He will keep track of his blood pressures at home and call us.  I have also asked him to check with primary care about gabapentin and Ritalin that he takes.  TSH was normal in December. Medication Adjustments/Labs and Tests Ordered: Current medicines are reviewed at length with the patient today.  Concerns regarding medicines are outlined above.  Medication changes, Labs and Tests ordered today are listed in the Patient Instructions below. Patient Instructions  Medication Instructions:  Your physician has recommended you make the following change in your medication:  1.  DECREASE the Valsartan to 40 mg taking 1/2 tablet daily  Labwork: None ordered  Testing/Procedures: Your physician has recommended that you wear a holter monitor. Holter monitors are medical devices that record the heart's electrical activity. Doctors most often use these monitors to diagnose arrhythmias. Arrhythmias are problems with the speed or rhythm of the heartbeat. The monitor is a small, portable device. You can wear one while you do your normal daily activities. This is usually used to diagnose what is causing palpitations/syncope (passing out).   Follow-Up: Your physician recommends that you schedule a follow-up appointment in: 07/19/17 ARRIVE AT 8:25 TO SEE DR. Meda Coffee   Any Other Special  Instructions Will Be Listed Below (If Applicable).  Monitor your blood pressure and call us if it continues to stay low.   Holter Monitoring A Holter monitor is a small device that is used to detect abnormal heart rhythms. It clips to your clothing and is connected by wires to flat, sticky disks (electrodes) that attach to your chest. It is worn continuously for 24-48 hours. Follow these instructions at home:  Wear your Holter monitor at all times, even while exercising and sleeping, for as long as directed by your health care provider.  Make sure that the Holter monitor is safely clipped to your clothing or close to your body as recommended by your health care provider.  Do not get the monitor or wires wet.  Do not put body lotion or moisturizer on your chest.  Keep your skin clean.  Keep a diary of your daily activities, such as walking and doing chores. If you feel that your heartbeat is abnormal or that your heart is fluttering or skipping a beat: ? Record what you are doing when it happens. ? Record what time of day the symptoms occur.  Return your Holter monitor as directed by your health care provider.  Keep all follow-up visits as directed by your health care provider. This is important. Get help right away if:  You feel lightheaded or you faint.  You have trouble breathing.  You feel pain in your chest, upper arm, or jaw.  You feel sick to your stomach and your skin is pale, cool, or damp.  You heartbeat feels unusual or abnormal. This information is not intended to replace  advice given to you by your health care provider. Make sure you discuss any questions you have with your health care provider. Document Released: 01/17/2004 Document Revised: 09/26/2015 Document Reviewed: 11/27/2013 Elsevier Interactive Patient Education  [employer].    If you need a refill on your cardiac medications before your next appointment, please call your pharmacy.       Sumner Boast, PA-C  07/06/2017 9:00 AM    [facility] [address], [place], [place]  [address] Phone: [phone]; Fax: [phone]

## 2017-07-06 ENCOUNTER — Ambulatory Visit: Payer: 59 | Admitting: Physician Assistant

## 2017-07-06 ENCOUNTER — Encounter: Payer: Self-pay | Admitting: Physician Assistant

## 2017-07-06 VITALS — BP 98/64 | HR 79 | Ht 70.0 in | Wt 193.0 lb

## 2017-07-06 DIAGNOSIS — R5383 Other fatigue: Secondary | ICD-10-CM

## 2017-07-06 DIAGNOSIS — Z8249 Family history of ischemic heart disease and other diseases of the circulatory system: Secondary | ICD-10-CM

## 2017-07-06 DIAGNOSIS — E785 Hyperlipidemia, unspecified: Secondary | ICD-10-CM

## 2017-07-06 DIAGNOSIS — I2510 Atherosclerotic heart disease of native coronary artery without angina pectoris: Secondary | ICD-10-CM | POA: Diagnosis not present

## 2017-07-06 DIAGNOSIS — R002 Palpitations: Secondary | ICD-10-CM

## 2017-07-06 HISTORY — DX: Palpitations: R00.2

## 2017-07-06 MED ORDER — VALSARTAN 40 MG PO TABS: 20.0000 mg | ORAL_TABLET | Freq: Every day | ORAL | 1 refills | Status: DC

## 2017-07-06 NOTE — Patient Instructions (Addendum)
Medication Instructions:  Your physician has recommended you make the following change in your medication:  1.  DECREASE the Valsartan to 40 mg taking 1/2 tablet daily  Labwork: None ordered  Testing/Procedures: Your physician has recommended that you wear a holter monitor. Holter monitors are medical devices that record the heart's electrical activity. Doctors most often use these monitors to diagnose arrhythmias. Arrhythmias are problems with the speed or rhythm of the heartbeat. The monitor is a small, portable device. You can wear one while you do your normal daily activities. This is usually used to diagnose what is causing palpitations/syncope (passing out).   Follow-Up: Your physician recommends that you schedule a follow-up appointment in: 07/19/17 ARRIVE AT 8:25 TO SEE DR. Meda Coffee   Any Other Special Instructions Will Be Listed Below (If Applicable).  Monitor your blood pressure and call us if it continues to stay low.   Holter Monitoring A Holter monitor is a small device that is used to detect abnormal heart rhythms. It clips to your clothing and is connected by wires to flat, sticky disks (electrodes) that attach to your chest. It is worn continuously for 24-48 hours. Follow these instructions at home:  Wear your Holter monitor at all times, even while exercising and sleeping, for as long as directed by your health care provider.  Make sure that the Holter monitor is safely clipped to your clothing or close to your body as recommended by your health care provider.  Do not get the monitor or wires wet.  Do not put body lotion or moisturizer on your chest.  Keep your skin clean.  Keep a diary of your daily activities, such as walking and doing chores. If you feel that your heartbeat is abnormal or that your heart is fluttering or skipping a beat: ? Record what you are doing when it happens. ? Record what time of day the symptoms occur.  Return your Holter monitor as  directed by your health care provider.  Keep all follow-up visits as directed by your health care provider. This is important. Get help right away if:  You feel lightheaded or you faint.  You have trouble breathing.  You feel pain in your chest, upper arm, or jaw.  You feel sick to your stomach and your skin is pale, cool, or damp.  You heartbeat feels unusual or abnormal. This information is not intended to replace advice given to you by your health care provider. Make sure you discuss any questions you have with your health care provider. Document Released: 01/17/2004 Document Revised: 09/26/2015 Document Reviewed: 11/27/2013 Elsevier Interactive Patient Education  [employer].    If you need a refill on your cardiac medications before your next appointment, please call your pharmacy.

## 2017-07-12 MED FILL — PRAVASTATIN NA 20 MG TAB: 20 | 90 days supply | Qty: 90 | Fill #0

## 2017-07-13 ENCOUNTER — Telehealth: Payer: Self-pay | Admitting: Cardiology

## 2017-07-13 NOTE — Telephone Encounter (Signed)
New Message   Pt c/o BP issue:  1. What are your last 5 BP readings? 112/58, 116/64, 110/68, 112/68 2. Are you having any other symptoms (ex. Dizziness, headache, blurred vision, passed out)? No symptoms 3. What is your medication issue? None   Patient states that he was told to monitor his BP and here lately its been a little low. He thinks it may be the valsartan but is not sure. Please call to discuss.

## 2017-07-13 NOTE — Telephone Encounter (Signed)
Pt is calling to inform Dr Meda Coffee of improved BP readings since she cut his Valsartan in 1/2.  Pt states he is asymptomatic and his fatigue has very much improved.  Advised the pt to continue his med regimen as is, for his BP readings are within normal limits. Informed the pt that I will route these readings to Dr Meda Coffee for her review as well.  Pt verbalized understanding and agrees with this plan.  Pt gracious for all the assistance provided.

## 2017-07-19 ENCOUNTER — Ambulatory Visit: Payer: 59 | Admitting: Cardiology

## 2017-07-19 MED FILL — CLOPIDOGREL 75 MG TABLET: 75 | 90 days supply | Qty: 90 | Fill #0

## 2017-07-20 ENCOUNTER — Ambulatory Visit (INDEPENDENT_AMBULATORY_CARE_PROVIDER_SITE_OTHER): Payer: 59

## 2017-07-20 DIAGNOSIS — R002 Palpitations: Secondary | ICD-10-CM | POA: Diagnosis not present

## 2017-07-21 ENCOUNTER — Encounter: Payer: Self-pay | Admitting: Cardiology

## 2017-07-21 ENCOUNTER — Ambulatory Visit: Payer: 59 | Admitting: Cardiology

## 2017-07-21 VITALS — BP 122/64 | HR 79 | Ht 70.0 in | Wt 192.6 lb

## 2017-07-21 DIAGNOSIS — R002 Palpitations: Secondary | ICD-10-CM

## 2017-07-21 DIAGNOSIS — Z8249 Family history of ischemic heart disease and other diseases of the circulatory system: Secondary | ICD-10-CM

## 2017-07-21 DIAGNOSIS — E782 Mixed hyperlipidemia: Secondary | ICD-10-CM | POA: Diagnosis not present

## 2017-07-21 DIAGNOSIS — N529 Male erectile dysfunction, unspecified: Secondary | ICD-10-CM | POA: Diagnosis not present

## 2017-07-21 DIAGNOSIS — E785 Hyperlipidemia, unspecified: Secondary | ICD-10-CM | POA: Diagnosis not present

## 2017-07-21 DIAGNOSIS — R5383 Other fatigue: Secondary | ICD-10-CM

## 2017-07-21 DIAGNOSIS — I2510 Atherosclerotic heart disease of native coronary artery without angina pectoris: Secondary | ICD-10-CM | POA: Diagnosis not present

## 2017-07-21 DIAGNOSIS — I10 Essential (primary) hypertension: Secondary | ICD-10-CM

## 2017-07-21 MED ORDER — SILDENAFIL CITRATE 50 MG PO TABS: 50.0000 mg | ORAL_TABLET | Freq: Every day | ORAL | 10 refills | Status: DC | PRN

## 2017-07-21 MED ORDER — LISINOPRIL 5 MG PO TABS: 5.0000 mg | ORAL_TABLET | Freq: Every day | ORAL | 3 refills | Status: DC

## 2017-07-21 MED FILL — SILDENAFIL CITRATE 50 MG TA: 50 | 25 days supply | Qty: 5 | Fill #0

## 2017-07-21 MED FILL — LISINOPRIL 5 MG TAB: 5 | 90 days supply | Qty: 90 | Fill #0

## 2017-07-21 NOTE — Patient Instructions (Signed)
Your physician has recommended you make the following change in your medication:  1) stop cialis 2.) stop valsartan 3.) start lisinopril 5 mg --one tablet once a day 4.) start viagra 50 mg -- one tablet once a day as needed  Your physician recommends that you schedule a follow-up appointment in: 3 months with Dr. Meda Coffee.

## 2017-07-21 NOTE — Progress Notes (Signed)
Cardiology Office Note    Date:  07/21/2017   ID:  Reginald Rios 22-Nov-1955, MRN [id number]  PCP:  Reginald Shanks, MD  Cardiologist: Reginald Dawley, MD  History of Present Illness:  Reginald Rios is a 62 y.o. male who was complaining of dyspnea on exertion and increased fatigue when playing tennis.  Father died of an MI at age 91.  He had an abnormal cardiac CT FFR analysis showing significant stenosis in the mid circumflex and proximal OM1.  He underwent successful stenting to the mid distal circumflex 06/23/17.  There is moderate calcified LAD 60% with FFR of 0.83, not felt to be significant.  Patient feels significant fatigue. 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  He said he was totally wiped out.  He says this is unusual for him.  Denies chest pain, dyspnea dizziness or presyncope.  He does feel like his heart is beating out of his chest at times after an activity.  07/21/2017 - the patient is coming after 1 months, he continues to feel tired, play 2 hours of tennis with no shortness of breath or chest pain actually feels better. He feels profound fatigue during th and after other activities like mowing his yard.he has been using albuterol inhaler as has allergies and has been coughing a lot. He uses valsartan but hasn't been called for a recall. He has erectile dysfunction and states that Cialis is no efficient and would like to switch.  Past Medical History:  Diagnosis Date  . ADD (attention deficit disorder)   . Anemia   . Arthritis   . CAD (coronary artery disease)    2/19 PCI/DESx1 to Lcx  . Diabetes mellitus without complication ([place])   . Exercise-induced asthma   . Heart murmur   . Hyperlipidemia    family hx of high cholesterol  . Hypogonadism in male   . Low back pain   . Osteoarthritis     Past Surgical History:  Procedure Laterality Date  . CORONARY STENT INTERVENTION N/A 06/23/2017   Procedure: CORONARY STENT INTERVENTION;  Surgeon: Reginald Man, MD;  Location: [facility];  Service: Cardiovascular;  Laterality: N/A;  . INTRAVASCULAR PRESSURE WIRE/FFR STUDY N/A 06/23/2017   Procedure: INTRAVASCULAR PRESSURE WIRE/FFR STUDY;  Surgeon: Reginald Man, MD;  Location: [facility];  Service: Cardiovascular;  Laterality: N/A;  . KNEE ARTHROSCOPY    . LEFT HEART CATH AND CORONARY ANGIOGRAPHY N/A 06/23/2017   Procedure: LEFT HEART CATH AND CORONARY ANGIOGRAPHY;  Surgeon: Reginald Man, MD;  Location: [facility];  Service: Cardiovascular;  Laterality: N/A;  . TENOTOMY ACHILLES TENDON     Current Medications: Current Meds  Medication Sig  . aspirin EC 81 MG tablet Take 81 mg by mouth daily.  . clopidogrel (PLAVIX) 75 MG tablet Take 1 tablet (75 mg total) by mouth daily with breakfast.  . Coenzyme Q10 (COQ10) 200 MG CAPS Take 200 mg by mouth daily.  Marland Kitchen gabapentin (NEURONTIN) 100 MG capsule Take 1 capsule (100 mg total) by mouth at bedtime.  Marland Kitchen JANUMET 50-1000 MG per tablet TAKE 1 TABLET BY MOUTH TWICE DAILY WITH A MEAL  . methylphenidate (RITALIN) 20 MG tablet Take 20 mg by mouth daily.  . nateglinide (STARLIX) 120 MG tablet Take 1 tablet (120 mg total) by mouth daily. (Patient taking differently: Take 120 mg by mouth daily as needed (for blood sugar greater  than 150). )  . nitroGLYCERIN (NITROSTAT) 0.4 MG SL tablet Place 1 tablet (0.4 mg total) under the tongue every 5 (five) minutes as needed.  . pravastatin (PRAVACHOL) 20 MG tablet Take 20 mg by mouth daily.  . TURMERIC PO Take 1 capsule by mouth daily.  . VENTOLIN HFA 108 (90 Base) MCG/ACT inhaler Inhale 1 puff into the lungs every 6 (six) hours as needed (cough).  . [DISCONTINUED] valsartan (DIOVAN) 40 MG tablet Take 0.5 tablets (20 mg total) by mouth daily.    Allergies:   Invokana [canagliflozin] and Statins   Social History   Socioeconomic History  . Marital status: Married    Spouse name:  None  . Number of children: None  . Years of education: None  . Highest education level: None  Social Needs  . Financial resource strain: None  . Food insecurity - worry: None  . Food insecurity - inability: None  . Transportation needs - medical: None  . Transportation needs - non-medical: None  Occupational History  . None  Tobacco Use  . Smoking status: Never Smoker  . Smokeless tobacco: Never Used  Substance and Sexual Activity  . Alcohol use: Yes    Alcohol/week: 0.0 oz    Comment: rare  . Drug use: No  . Sexual activity: None  Other Topics Concern  . 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 Margurite Rios   Moved from [place] up in [place]    Family History:  The patient's family history includes Coronary artery disease (age of onset: 7) in his father; Hypothyroidism in his brother; Lung cancer (age of onset: 34) in his mother.   ROS:   Please see the history of present illness.    Review of Systems  Constitution: Positive for malaise/fatigue.  HENT: Negative.   Cardiovascular: Positive for palpitations.  Respiratory: Negative.   Endocrine: Negative.   Hematologic/Lymphatic: Negative.   Musculoskeletal: Negative.   Gastrointestinal: Negative.   Genitourinary: Negative.   Neurological: Negative.    All other systems reviewed and are negative.  PHYSICAL EXAM:   VS:  BP 122/64   Pulse 79   Ht 5\' 10"  (1.778 m)   Wt 192 lb 9.6 oz (87.4 kg)   SpO2 98%   BMI 27.64 kg/m   Physical Exam  GEN: Well nourished, well developed, in no acute distress  Neck: no JVD, carotid bruits, or masses Cardiac:RRR; no murmurs, rubs, or gallops  Respiratory:  clear to auscultation bilaterally, normal work of breathing GI: soft, nontender, nondistended, + BS Ext: Right arm without hematoma or hemorrhage at cath site.  Good brachial  and radial pulses.  Lower extremities without cyanosis, clubbing, or edema, Good distal pulses bilaterally Neuro:  Alert and Oriented x 3,  Psych: euthymic mood, full affect  Wt Readings from Last 3 Encounters:  07/21/17 192 lb 9.6 oz (87.4 kg)  07/06/17 193 lb (87.5 kg)  06/23/17 192 lb (87.1 kg)    Studies/Labs Reviewed:   EKG:  EKG is  ordered today.  The ekg ordered today demonstrates normal sinus rhythm with PACs  Recent Labs: 04/21/2017: ALT 25; TSH 2.280 06/11/2017: BUN 15; Creatinine, Ser 0.87; Hemoglobin 13.1; Platelets 235; Potassium 4.6; Sodium 144   Lipid Panel    Component Value Date/Time   CHOL 150 04/21/2017 1019  TRIG 114 04/21/2017 1019   HDL 53 04/21/2017 1019   CHOLHDL 2.8 04/21/2017 1019   CHOLHDL 2.7 09/16/2015 1038   VLDL 11 09/16/2015 1038   LDLCALC 74 04/21/2017 1019    Additional studies/ records that were reviewed today include:  PROCEDURE: 6Fr R Radial Access - US Guided.  5 Fr TIG 4.0 Cath fo rR&LCA Angio & LV Pressures.  PCI: 6 Fr  XBLAD Guide, Prowater wire - 2.5 x 16 Synergy DES dCx (post-dilated 3.1-2.6 mm).    FFR mCx - Prowater wire, Micronavvus Catheter, Adenosine 140 mg/kg/min x 2 min   Cath: 06/23/17   Conclusion       CULPRIT LESION mid Cx to Dist Cx lesion is 80% stenosed.  A drug-eluting stent was successfully placed using a STENT SYNERGY DES 2.5X16. Postdilated and taper fashion from 3.1-2.6 mm  Post intervention, there is a 0% residual stenosis.  Prox LAD to Mid LAD lesion is 60% stenosed. --FFR 0.83. Not significant.  The ostial-proximal LAD and Circumflex have mild-moderate calcification without significant stenosis.  OM1 is a relatively small caliber vessel after gives off a branch and is not a good PCI target. No obvious lesions were noted to explain the FFR positive findings.   Successful PCI to distal circumflex likely culprit lesion reducing 80% to 0% with a Synergy DES stent postdilated in tapered fashion. FFR not  significant lesion of the LAD medical management -this correlates with CTA       IMPRESSION: 1. CT FFR analysis showed significant stenoses in the mid LCX and proximal OM1 arteries. A cardiac catheterization is recommended.   No acute extra cardiac abnormality. Does have Fatty infiltration of the liver.     With the blockage in the LCX, Dr. Meda Coffee would like to proceed with cardiac cath to further eval and treat if needed with stent.  Discussed cath in detail with pt and wife. Risks included. The patient was willing to proceed.   PROCEDURE: 6Fr R Radial Access - US Guided.  5 Fr TIG 4.0 Cath fo rR&LCA Angio & LV Pressures.  PCI: 6 Fr  XBLAD Guide, Prowater wire - 2.5 x 16 Synergy DES dCx (post-dilated 3.1-2.6 mm).    FFR mCx - Prowater wire, Micronavvus Catheter, Adenosine 140 mg/kg/min x 2 min   2D eStudy Conclusions   - Procedure narrative: Transthoracic echocardiography. Image   quality was adequate. The study was technically difficult. - Left ventricle: The cavity size was normal. Systolic function was   normal. The estimated ejection fraction was in the range of 50%   to 55%. Wall motion was normal; there were no regional wall   motion abnormalities. Left ventricular diastolic function   parameters were normal. - Aortic valve: Transvalvular velocity was within the normal range.   There was no stenosis. There was no regurgitation. - Mitral valve: Transvalvular velocity was within the normal range.   There was no evidence for stenosis. There was trivial   regurgitation. - Right ventricle: The cavity size was normal. Wall thickness was   normal. Systolic function was normal. - Tricuspid valve: There was trivial regurgitation. - Pulmonary arteries: Systolic pressure was within the normal   range. PA peak pressure: 27 mm Hg (S).   cho 06/23/17   ASSESSMENT:    1. Coronary artery disease involving native coronary artery of native heart without angina pectoris   2.  Hyperlipidemia, unspecified hyperlipidemia type   3. Family history of early CAD   39. Palpitations   5. Fatigue,  unspecified type   6. Essential hypertension   7. Mixed hyperlipidemia   8. Vasculogenic erectile dysfunction, unspecified vasculogenic erectile dysfunction type      PLAN:  In order of problems listed above:  CAD with recent abnormal CTA/FFR resulting in stenting to the circumflex with residual 60% LAD FFR not significant medical therapy recommended.  Continue Plavix and aspirin  Hyperlipidemia continue pravastatin  Family history of early CAD with his father dying at 28 of an MI  Palpitations usually occur after an activity when he feels his heart racing. He is currently wearing a 48-hour monitor , we will evaluate once Available.  Fatigue - doubt this is related to see as he is asymptomatic with playing tennis for 2 hours, TSH was normal vitamin D was low he is recommended to continue taking supplements.  Hypertension - switch valsartan to lisinopril 5 mg daily.  Erectile dysfunction - discontinue Cialis, start Viagra 50 mg daily as needed, he is explained that this can never be taken in combination with sublingual nitroglycerin.   Medication Adjustments/Labs and Tests Ordered: Current medicines are reviewed at length with the patient today.  Concerns regarding medicines are outlined above.  Medication changes, Labs and Tests ordered today are listed in the Patient Instructions below. There are no Patient Instructions on file for this visit.   Signed, Reginald Dawley, MD  07/21/2017 2:09 PM    [facility] [place], [place], [place]  [address] Phone: [phone]; Fax: [phone]

## 2017-07-23 MED FILL — METHYLPHENIDATE 20 MG TAB: 20 | 30 days supply | Qty: 60 | Fill #0

## 2017-07-29 MED FILL — GABAPENTIN 100 MG CAPSULE: 100 | 90 days supply | Qty: 90 | Fill #1

## 2017-08-11 ENCOUNTER — Telehealth: Payer: Self-pay | Admitting: *Deleted

## 2017-08-11 NOTE — Telephone Encounter (Signed)
Pt phoned requesting transfer from Dr. Meda Coffee to Dr. Bettina Gavia due to location and convenience. Sent Dr. Meda Coffee staff message and included Dr. Bettina Gavia as well.

## 2017-08-13 MED FILL — TESTOSTERONE 50 MG/5GM (1%): 50 MG/5GM | 90 days supply | Qty: 900 | Fill #0

## 2017-08-17 DIAGNOSIS — E7800 Pure hypercholesterolemia, unspecified: Secondary | ICD-10-CM | POA: Diagnosis not present

## 2017-08-17 DIAGNOSIS — N529 Male erectile dysfunction, unspecified: Secondary | ICD-10-CM | POA: Diagnosis not present

## 2017-08-17 DIAGNOSIS — Z955 Presence of coronary angioplasty implant and graft: Secondary | ICD-10-CM | POA: Diagnosis not present

## 2017-08-17 DIAGNOSIS — E291 Testicular hypofunction: Secondary | ICD-10-CM | POA: Diagnosis not present

## 2017-08-17 DIAGNOSIS — F988 Other specified behavioral and emotional disorders with onset usually occurring in childhood and adolescence: Secondary | ICD-10-CM | POA: Diagnosis not present

## 2017-08-17 DIAGNOSIS — E119 Type 2 diabetes mellitus without complications: Secondary | ICD-10-CM | POA: Diagnosis not present

## 2017-08-17 DIAGNOSIS — Z5181 Encounter for therapeutic drug level monitoring: Secondary | ICD-10-CM | POA: Diagnosis not present

## 2017-08-17 DIAGNOSIS — E1165 Type 2 diabetes mellitus with hyperglycemia: Secondary | ICD-10-CM | POA: Diagnosis not present

## 2017-08-17 DIAGNOSIS — I2510 Atherosclerotic heart disease of native coronary artery without angina pectoris: Secondary | ICD-10-CM | POA: Diagnosis not present

## 2017-08-17 DIAGNOSIS — G629 Polyneuropathy, unspecified: Secondary | ICD-10-CM | POA: Diagnosis not present

## 2017-08-17 MED FILL — METHYLPHENIDATE 20 MG TAB: 20 | 30 days supply | Qty: 60 | Fill #0

## 2017-08-18 ENCOUNTER — Telehealth: Payer: Self-pay | Admitting: Cardiology

## 2017-08-18 MED ORDER — LOSARTAN POTASSIUM 25 MG PO TABS: 25.0000 mg | ORAL_TABLET | Freq: Every day | ORAL | 2 refills | Status: DC

## 2017-08-18 MED FILL — LOSARTAN POTASSIUM 25 MG TA: 25 | 90 days supply | Qty: 90 | Fill #0

## 2017-08-18 MED FILL — PRAVASTATIN NA 40 MG TAB: 40 | 90 days supply | Qty: 90 | Fill #0

## 2017-08-18 NOTE — Telephone Encounter (Signed)
Pt is calling to report that ever since he started taking lisinopril on 07/21/17, when he saw Dr Meda Coffee, he has noted a dry, continuous, and hacking cough. Pt states he would like to stop this medication and have Dr Meda Coffee advise on a different regimen.  Pt was switched from valsartan to Lisinopril, due to recall.  Advised the pt to stop taking his lisinopril, and I will take this out of his med list, and update this in his allergies.  Informed the pt that I will route this message to Dr Meda Coffee to review and advise on, and follow-up with the pt thereafter.  Pt verbalized understanding and agrees with this plan.

## 2017-08-18 NOTE — Telephone Encounter (Signed)
Endorsed to the pt that per Dr Meda Coffee, we will stop his lisinopril, as previously discussed, and start him on losartan 25 mg po daily.  Confirmed the pharmacy of choice with the pt. Pt requesting just a month supply to be sent to his pharmacy for now, to make sure he tolerates this appropriately. Endorsed to the pt that if he has any issues with getting this med filled by his pharmacy, for mention of recall, then he should call our office back.  Pt verbalized understanding and agrees with this plan.

## 2017-08-18 NOTE — Telephone Encounter (Signed)
Losartan 25 mg po daily

## 2017-08-18 NOTE — Telephone Encounter (Signed)
New message   Pt c/o medication issue:  1. Name of Medication: lisinopril (PRINIVIL,ZESTRIL) 5 MG tablet  2. How are you currently taking this medication (dosage and times per day)?Take 1 tablet (5 mg total) by mouth daily.  3. Are you having a reaction (difficulty breathing--STAT)? Coughing , wheezing  4. What is your medication issue? Patient wants to stop taking medication, due to coughing and wheezing

## 2017-08-25 ENCOUNTER — Ambulatory Visit: Payer: 59 | Admitting: Podiatry

## 2017-08-25 ENCOUNTER — Encounter: Payer: Self-pay | Admitting: Podiatry

## 2017-08-25 VITALS — BP 158/78 | HR 84 | Ht 70.0 in | Wt 189.0 lb

## 2017-08-25 DIAGNOSIS — M21969 Unspecified acquired deformity of unspecified lower leg: Secondary | ICD-10-CM

## 2017-08-25 DIAGNOSIS — M7742 Metatarsalgia, left foot: Secondary | ICD-10-CM

## 2017-08-25 DIAGNOSIS — M24571 Contracture, right ankle: Secondary | ICD-10-CM

## 2017-08-25 DIAGNOSIS — M2041 Other hammer toe(s) (acquired), right foot: Secondary | ICD-10-CM

## 2017-08-25 MED ORDER — DICLOFENAC SODIUM 1 % TD GEL: 4.0000 g | Freq: Four times a day (QID) | TRANSDERMAL | 2 refills | Status: DC

## 2017-08-25 MED FILL — DICLOFENAC SODIUM 1% GEL: 1 | 7 days supply | Qty: 100 | Fill #0

## 2017-08-25 NOTE — Patient Instructions (Signed)
Seen for pain in left foot. Noted of severely elevated first ray with lateral weight shifting on left, tight Achilles tendon on right. Reviewed available treatment options. Voltaren gel ordered for left foot pain. Both feet casted for Orthotics. May also benefit from Equinus brace. Will call for insurance benefits.

## 2017-08-25 NOTE — Progress Notes (Signed)
SUBJECTIVE: 62 y.o. year old male presents complaining of pain in left foot x 2 weeks.  Usually starts when standing in bath tub.  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 Last blood glucose was 135 on 06/23/17. A1c was 7.  Pertinent medical history includes; Surgical history: Neck surgery January 2019. Heart stent put in March 2019. Intolerance to Advil. Patient is on Plavix and Gabapentin ( for 2-3 years of night pain). Positive of Coronary arterial disease and chronic back pain. Been diabetic x 10 years. History of Achilles tendon injury and surgical repair right side in 2016. Posterior leg pain R>L for the past 10 years.  Review of Systems  Constitutional: Negative.   HENT: Negative.   Eyes: Negative.   Respiratory: Negative.   Cardiovascular:       Stent put in March 2019.  Gastrointestinal: Negative.   Musculoskeletal: Positive for back pain and neck pain.       Neck surgery January 2019. Back pain for 15 years. Low back disc is pressing on nerve.  Skin: Negative.      OBJECTIVE: DERMATOLOGIC EXAMINATION: No abnormal findings.  VASCULAR EXAMINATION OF LOWER LIMBS: All pedal pulses are palpable with normal pulsation.  No edema or erythema noted. Temperature gradient from tibial crest to dorsum of foot is within normal bilateral.  NEUROLOGIC EXAMINATION OF THE LOWER LIMBS: All epicritic and tactile sensations grossly intact. Sharp and Dull discriminatory sensations at the plantar ball of hallux is intact bilateral.   MUSCULOSKELETAL EXAMINATION: Positive for elevated first ray bilateral. Forefoot varus bilateral. Severe digital contracture 2nd right. Tight Achilles tendon right.  RADIOGRAPHIC STUDY of both feet include;.  AP View:  Severe narrowing of IPJ left hallux. Contracted lesser digits 2nd right. Lateral view:  High arched cavus type foot, severe elevated first ray bilateral.  ASSESSMENT: Ankle equinus right. Metatarsus primus elevatus  bilateral with lateral weight shifting left foot. Lesser metatarsalgia left foot. Forefoot varus bilateral.  PLAN: Reviewed findings and available treatment options, NSAIA, injection, orthortics, proper shoe gear, change in activity, possible surgical. Both feet casted for Orthotics. Voltaren gel prescribed for left foot pain. Reviewed possible use of Equinus brace to to stretch tight Achilles tendon on right. Will call for benefit into.

## 2017-08-27 ENCOUNTER — Telehealth: Payer: Self-pay | Admitting: *Deleted

## 2017-08-27 NOTE — Telephone Encounter (Signed)
Patient notified of benefits. He will take a day to think this over and call us back

## 2017-08-27 NOTE — Telephone Encounter (Signed)
For CPT codes L3020, [id number], [phone] the insurance pays 80% after the patient has met $300 deductible. Equinis brace is $400 and orthotics are 398.00 for a total of $798. The patient would be responsible for roughly $460 including $300 deductible. Called and left a message on patient's vm to call back   Ref # for call to [facility] was [id number]

## 2017-08-30 NOTE — Telephone Encounter (Signed)
Patient called back and states he does not want to proceed with the orthotics or equinis brace . He does feel the Voltaren gel is helping.

## 2017-09-01 MED FILL — JANUMET 50-1,000 MG TABLET: 50-1000 | 30 days supply | Qty: 60 | Fill #0

## 2017-09-05 DIAGNOSIS — J019 Acute sinusitis, unspecified: Secondary | ICD-10-CM | POA: Diagnosis not present

## 2017-10-11 MED FILL — JANUMET 50-1,000 MG TABLET: 50-1000 | 30 days supply | Qty: 60 | Fill #1

## 2017-10-11 MED FILL — MONTELUKAST SOD 10 MG TAB: 10 | 90 days supply | Qty: 90 | Fill #0

## 2017-10-12 MED FILL — CLOPIDOGREL 75 MG TABLET: 75 | 90 days supply | Qty: 90 | Fill #1

## 2017-11-01 MED FILL — GABAPENTIN 100 MG CAPSULE: 100 | 90 days supply | Qty: 90 | Fill #2

## 2017-11-22 ENCOUNTER — Other Ambulatory Visit: Payer: Self-pay | Admitting: Cardiology

## 2017-11-22 MED FILL — JANUMET 50-1,000 MG TABLET: 50-1000 | 30 days supply | Qty: 60 | Fill #2

## 2017-11-22 MED FILL — LOSARTAN POTASSIUM 25 MG TA: 25 | 90 days supply | Qty: 90 | Fill #0

## 2017-11-22 MED FILL — PRAVASTATIN NA 40 MG TAB: 40 | 90 days supply | Qty: 90 | Fill #1

## 2017-11-23 MED FILL — METHYLPHENIDATE 20 MG TAB: 20 | 30 days supply | Qty: 60 | Fill #0

## 2017-11-25 MED FILL — TESTOSTERONE 50 MG/5GM (1%): 50 MG/5GM | 90 days supply | Qty: 900 | Fill #0

## 2017-12-14 DIAGNOSIS — J4520 Mild intermittent asthma, uncomplicated: Secondary | ICD-10-CM | POA: Diagnosis not present

## 2017-12-14 DIAGNOSIS — E291 Testicular hypofunction: Secondary | ICD-10-CM | POA: Diagnosis not present

## 2017-12-14 DIAGNOSIS — N529 Male erectile dysfunction, unspecified: Secondary | ICD-10-CM | POA: Diagnosis not present

## 2017-12-14 DIAGNOSIS — E7800 Pure hypercholesterolemia, unspecified: Secondary | ICD-10-CM | POA: Diagnosis not present

## 2017-12-14 DIAGNOSIS — Z5181 Encounter for therapeutic drug level monitoring: Secondary | ICD-10-CM | POA: Diagnosis not present

## 2017-12-14 DIAGNOSIS — E119 Type 2 diabetes mellitus without complications: Secondary | ICD-10-CM | POA: Diagnosis not present

## 2017-12-14 DIAGNOSIS — G629 Polyneuropathy, unspecified: Secondary | ICD-10-CM | POA: Diagnosis not present

## 2017-12-14 DIAGNOSIS — F988 Other specified behavioral and emotional disorders with onset usually occurring in childhood and adolescence: Secondary | ICD-10-CM | POA: Diagnosis not present

## 2017-12-14 DIAGNOSIS — Z955 Presence of coronary angioplasty implant and graft: Secondary | ICD-10-CM | POA: Diagnosis not present

## 2017-12-14 MED FILL — FREESTYLE LITE TEST STRIP: 50 days supply | Qty: 50 | Fill #0

## 2017-12-14 MED FILL — ADVAIR HFA 115-21 MCG INH: 115-21 | 30 days supply | Qty: 12 | Fill #0

## 2017-12-14 MED FILL — FREESTYLE LITE METER: 1 days supply | Qty: 1 | Fill #0

## 2017-12-14 MED FILL — FREESTYLE LANCETS: 100 days supply | Qty: 100 | Fill #0

## 2017-12-22 DIAGNOSIS — H5213 Myopia, bilateral: Secondary | ICD-10-CM | POA: Diagnosis not present

## 2017-12-22 DIAGNOSIS — H524 Presbyopia: Secondary | ICD-10-CM | POA: Diagnosis not present

## 2017-12-22 DIAGNOSIS — H52223 Regular astigmatism, bilateral: Secondary | ICD-10-CM | POA: Diagnosis not present

## 2017-12-24 DIAGNOSIS — H33321 Round hole, right eye: Secondary | ICD-10-CM | POA: Diagnosis not present

## 2017-12-24 DIAGNOSIS — H43821 Vitreomacular adhesion, right eye: Secondary | ICD-10-CM | POA: Diagnosis not present

## 2017-12-28 DIAGNOSIS — H33321 Round hole, right eye: Secondary | ICD-10-CM | POA: Diagnosis not present

## 2017-12-29 DIAGNOSIS — Z955 Presence of coronary angioplasty implant and graft: Secondary | ICD-10-CM | POA: Diagnosis not present

## 2017-12-29 DIAGNOSIS — G629 Polyneuropathy, unspecified: Secondary | ICD-10-CM | POA: Diagnosis not present

## 2017-12-29 DIAGNOSIS — J4520 Mild intermittent asthma, uncomplicated: Secondary | ICD-10-CM | POA: Diagnosis not present

## 2017-12-29 DIAGNOSIS — E7800 Pure hypercholesterolemia, unspecified: Secondary | ICD-10-CM | POA: Diagnosis not present

## 2017-12-29 DIAGNOSIS — Z5181 Encounter for therapeutic drug level monitoring: Secondary | ICD-10-CM | POA: Diagnosis not present

## 2017-12-29 DIAGNOSIS — N529 Male erectile dysfunction, unspecified: Secondary | ICD-10-CM | POA: Diagnosis not present

## 2017-12-29 DIAGNOSIS — F988 Other specified behavioral and emotional disorders with onset usually occurring in childhood and adolescence: Secondary | ICD-10-CM | POA: Diagnosis not present

## 2017-12-29 DIAGNOSIS — E291 Testicular hypofunction: Secondary | ICD-10-CM | POA: Diagnosis not present

## 2017-12-29 DIAGNOSIS — E119 Type 2 diabetes mellitus without complications: Secondary | ICD-10-CM | POA: Diagnosis not present

## 2018-01-12 MED FILL — JANUMET 50-1,000 MG TABLET: 50-1000 | 30 days supply | Qty: 60 | Fill #0

## 2018-01-17 NOTE — Progress Notes (Signed)
Cardiology Office Note:    Date:  01/18/2018   ID:  Reginald Rios, DOB 01-Jan-1956, MRN [id number]  PCP:  Vernie Shanks, MD  Cardiologist:  Shirlee More, MD    Referring MD: Vernie Shanks, MD    ASSESSMENT:    1. Coronary artery disease involving native coronary artery of native heart without angina pectoris   2. Type 2 diabetes mellitus with diabetic polyneuropathy, with long-term current use of insulin ([place])   3. Hyperlipidemia, unspecified hyperlipidemia type   4. Nonischemic cardiomyopathy ([place])   5. Dyspnea on exertion   6. Decreased cardiac ejection fraction    PLAN:    In order of problems listed above:  1. He has chronic stable CAD device and remain on dual antiplatelet therapy for 1 year and then transition to single agent clopidogrel.  Recheck lipid profile with a goal LDL less than 55 and may require a second agent Zetia to achieve target.  He has an exemplary lifestyle walks up to 7 miles per day his diabetes is controlled and he has had no anginal discomfort.  Then with subtle exercise intolerance and will plan on doing a follow-up stress echo in March and see me 1 week afterwards with his residual moderate LAD stenosis.  Wires about using NSAIDs and I told him he can take it intermittently but if he is taking it every day he needs to be on a PPI 2. Stable managed by his PCP.  Of additional age as needed the GLP agents like Victoza would be ideal with his CAD 3. Stable continue his statin check lipid profile if LDL is greater than 70 and ideally should be less than 55 would require a second agent   Next appointment: 6 months   Medication Adjustments/Labs and Tests Ordered: Current medicines are reviewed at length with the patient today.  Concerns regarding medicines are outlined above.  Orders Placed This Encounter  Procedures  . Comprehensive Metabolic Panel (CMET)  . Lipid Profile  . EKG 12-Lead  . ECHOCARDIOGRAM STRESS TEST     History of Present  Illness:    Reginald Rios is a 62 y.o. male with a hx of CAD EF 50-55% with PCI and stenting to the mid distal circumflex 06/23/17 and a  calcified LAD 60% with FFR of 0.83. He was  last seen 07/21/17 by Dr Meda Coffee. Compliance with diet, lifestyle and medications: yes  Seen by me in the office and is just unsure of where he is at in his disease process.  He is concerned with family history of heart disease and sudden death wonders if he needs to stay on 2 antiplatelet agents discerned about his residual LAD stenosis.  His exercise intolerance had solved after PCI and he has had no chest pain shortness of breath palpitation or syncope.  He did not have typical angina for decision-making especially if there is residual moderate LAD stenosis he will undergo a stress echo in March prior to see me in 1 week.  To continue his close diabetic control exercise medications and see me in the office in 6 months   Medication Adjustments/Labs and Tests Ordered: Current medicines are reviewed at length with the patient today.  Concerns regarding medicines are outlined above.  Orders Placed This Encounter  Procedures  . Comprehensive Metabolic Panel (CMET)  . Lipid Profile  . EKG 12-Lead  . ECHOCARDIOGRAM STRESS TEST   No orders of the defined types were placed in this encounter.   Chief  Complaint  Patient presents with  . Coronary Artery Disease    do I need to take aspirin and plavix?    History of Present Illness:    Past Medical History:  Diagnosis Date  . ADD (attention deficit disorder)   . Anemia   . Arthritis   . CAD (coronary artery disease)    2/19 PCI/DESx1 to Lcx  . Diabetes mellitus without complication ([place])   . Exercise-induced asthma   . Heart murmur   . Hyperlipidemia    family hx of high cholesterol  . Hypogonadism in male   . Low back pain   . Osteoarthritis     Past Surgical History:  Procedure Laterality Date  . CORONARY STENT INTERVENTION N/A 06/23/2017    Procedure: CORONARY STENT INTERVENTION;  Surgeon: Leonie Man, MD;  Location: [facility];  Service: Cardiovascular;  Laterality: N/A;  . INTRAVASCULAR PRESSURE WIRE/FFR STUDY N/A 06/23/2017   Procedure: INTRAVASCULAR PRESSURE WIRE/FFR STUDY;  Surgeon: Leonie Man, MD;  Location: [facility];  Service: Cardiovascular;  Laterality: N/A;  . KNEE ARTHROSCOPY    . LEFT HEART CATH AND CORONARY ANGIOGRAPHY N/A 06/23/2017   Procedure: LEFT HEART CATH AND CORONARY ANGIOGRAPHY;  Surgeon: Leonie Man, MD;  Location: [facility];  Service: Cardiovascular;  Laterality: N/A;  . TENOTOMY ACHILLES TENDON      Current Medications: Current Meds  Medication Sig  . ADVAIR HFA 115-21 MCG/ACT inhaler Inhale 2 puffs into the lungs 2 (two) times daily as needed.   Marland Kitchen aspirin EC 81 MG tablet Take 81 mg by mouth daily.  . clopidogrel (PLAVIX) 75 MG tablet Take 1 tablet (75 mg total) by mouth daily with breakfast.  . diclofenac sodium (VOLTAREN) 1 % GEL Apply 4 g topically 4 (four) times daily. (Patient taking differently: Apply 4 g topically 4 (four) times daily as needed. )  . gabapentin (NEURONTIN) 100 MG capsule Take 1 capsule (100 mg total) by mouth at bedtime.  Marland Kitchen JANUMET 50-1000 MG per tablet TAKE 1 TABLET BY MOUTH TWICE DAILY WITH A MEAL  . losartan (COZAAR) 25 MG tablet Take 1 tablet (25 mg total) by mouth daily.  . methylphenidate (RITALIN) 20 MG tablet Take 20 mg by mouth daily.  . montelukast (SINGULAIR) 10 MG tablet Take 10 mg by mouth daily.  . nateglinide (STARLIX) 120 MG tablet Take 1 tablet (120 mg total) by mouth daily. (Patient taking differently: Take 120 mg by mouth daily as needed (for blood sugar greater than 150). )  . nitroGLYCERIN (NITROSTAT) 0.4 MG SL tablet Place 1 tablet (0.4 mg total) under the tongue every 5 (five) minutes as needed.  . pravastatin (PRAVACHOL) 20 MG tablet Take 20 mg by mouth daily.  . sildenafil (VIAGRA) 50 MG tablet Take 1 tablet (50 mg  total) by mouth daily as needed for erectile dysfunction.  . Testosterone 25 MG/2.5GM (1%) GEL Place onto the skin.  . VENTOLIN HFA 108 (90 Base) MCG/ACT inhaler Inhale 1 puff into the lungs every 6 (six) hours as needed (cough).     Allergies:   Invokana [canagliflozin]; Statins; and Lisinopril   Social History   Socioeconomic History  . Marital status: Married    Spouse name: Not on file  . Number of children: Not on file  . Years of education: Not on file  . Highest education level: Not on file  Occupational History  . Not on file  Social Needs  . Financial resource strain:  Not on file  . Food insecurity:    Worry: Not on file    Inability: Not on file  . Transportation needs:    Medical: Not on file    Non-medical: Not on file  Tobacco Use  . Smoking status: Never Smoker  . Smokeless tobacco: Never Used  Substance and Sexual Activity  . Alcohol use: Yes    Alcohol/week: 0.0 standard drinks    Comment: rare  . Drug use: No  . Sexual activity: Not on file  Lifestyle  . Physical activity:    Days per week: Not on file    Minutes per session: Not on file  . Stress: Not on file  Relationships  . Social connections:    Talks on phone: Not on file    Gets together: Not on file    Attends religious service: Not on file    Active member of club or organization: Not on file    Attends meetings of clubs or organizations: Not on file    Relationship status: Not on file  Other Topics Concern  . 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 Margurite Auerbach   Moved from [place] up in [place]     Family History: The patient's family history includes Coronary artery disease (age of onset: 80) in his father; Hypothyroidism in his brother; Lung cancer (age of onset: 82) in his mother. There is no history of Colon cancer or  Prostate cancer. ROS:   Please see the history of present illness.    All other systems reviewed and are negative.  EKGs/Labs/Other Studies Reviewed:    The following studies were reviewed today:  EKG:  EKG ordered today.  The ekg ordered today demonstrates [place] normal  Recent Labs: 04/21/2017: ALT 25; TSH 2.280 06/11/2017: BUN 15; Creatinine, Ser 0.87; Hemoglobin 13.1; Platelets 235; Potassium 4.6; Sodium 144  Recent Lipid Panel    Component Value Date/Time   CHOL 150 04/21/2017 1019   TRIG 114 04/21/2017 1019   HDL 53 04/21/2017 1019   CHOLHDL 2.8 04/21/2017 1019   CHOLHDL 2.7 09/16/2015 1038   VLDL 11 09/16/2015 1038   LDLCALC 74 04/21/2017 1019    Physical Exam:    VS:  BP 114/66 (BP Location: Left Arm, Patient Position: Sitting, Cuff Size: Normal)   Pulse 91   Ht 5\' 10"  (1.778 m)   Wt 185 lb 12.8 oz (84.3 kg)   SpO2 97%   BMI 26.66 kg/m     Wt Readings from Last 3 Encounters:  01/18/18 185 lb 12.8 oz (84.3 kg)  08/25/17 189 lb (85.7 kg)  07/21/17 192 lb 9.6 oz (87.4 kg)    [facility]  Past Medical History:  Diagnosis Date  . ADD (attention deficit disorder)   . Anemia   . Arthritis   . CAD (coronary artery disease)    2/19 PCI/DESx1 to Lcx  . Diabetes mellitus without complication ([place])   . Exercise-induced asthma   . Heart murmur   . Hyperlipidemia    family hx of high cholesterol  . Hypogonadism in male   . Low back pain   . Osteoarthritis     Past Surgical History:  Procedure Laterality Date  . CORONARY STENT INTERVENTION N/A 06/23/2017   Procedure: CORONARY STENT INTERVENTION;  Surgeon: Leonie Man, MD;  Location: [facility];  Service: Cardiovascular;  Laterality: N/A;  . INTRAVASCULAR PRESSURE WIRE/FFR STUDY N/A 06/23/2017   Procedure: INTRAVASCULAR PRESSURE WIRE/FFR STUDY;  Surgeon: Leonie Man, MD;  Location: [facility];  Service: Cardiovascular;  Laterality: N/A;  . KNEE ARTHROSCOPY    . LEFT  HEART CATH AND CORONARY ANGIOGRAPHY N/A 06/23/2017   Procedure: LEFT HEART CATH AND CORONARY ANGIOGRAPHY;  Surgeon: Leonie Man, MD;  Location: [facility];  Service: Cardiovascular;  Laterality: N/A;  . TENOTOMY ACHILLES TENDON      Current Medications: Current Meds  Medication Sig  . ADVAIR HFA 115-21 MCG/ACT inhaler Inhale 2 puffs into the lungs 2 (two) times daily as needed.   Marland Kitchen aspirin EC 81 MG tablet Take 81 mg by mouth daily.  . clopidogrel (PLAVIX) 75 MG tablet Take 1 tablet (75 mg total) by mouth daily with breakfast.  . diclofenac sodium (VOLTAREN) 1 % GEL Apply 4 g topically 4 (four) times daily. (Patient taking differently: Apply 4 g topically 4 (four) times daily as needed. )  . gabapentin (NEURONTIN) 100 MG capsule Take 1 capsule (100 mg total) by mouth at bedtime.  Marland Kitchen JANUMET 50-1000 MG per tablet TAKE 1 TABLET BY MOUTH TWICE DAILY WITH A MEAL  . losartan (COZAAR) 25 MG tablet Take 1 tablet (25 mg total) by mouth daily.  . methylphenidate (RITALIN) 20 MG tablet Take 20 mg by mouth daily.  . montelukast (SINGULAIR) 10 MG tablet Take 10 mg by mouth daily.  . nateglinide (STARLIX) 120 MG tablet Take 1 tablet (120 mg total) by mouth daily. (Patient taking differently: Take 120 mg by mouth daily as needed (for blood sugar greater than 150). )  . nitroGLYCERIN (NITROSTAT) 0.4 MG SL tablet Place 1 tablet (0.4 mg total) under the tongue every 5 (five) minutes as needed.  . pravastatin (PRAVACHOL) 20 MG tablet Take 20 mg by mouth daily.  . sildenafil (VIAGRA) 50 MG tablet Take 1 tablet (50 mg total) by mouth daily as needed for erectile dysfunction.  . Testosterone 25 MG/2.5GM (1%) GEL Place onto the skin.  . VENTOLIN HFA 108 (90 Base) MCG/ACT inhaler Inhale 1 puff into the lungs every 6 (six) hours as needed (cough).     Allergies:   Invokana [canagliflozin]; Statins; and Lisinopril   Social History   Socioeconomic History  . Marital status: Married    Spouse name: Not  on file  . Number of children: Not on file  . Years of education: Not on file  . Highest education level: Not on file  Occupational History  . Not on file  Social Needs  . Financial resource strain: Not on file  . Food insecurity:    Worry: Not on file    Inability: Not on file  . Transportation needs:    Medical: Not on file    Non-medical: Not on file  Tobacco Use  . Smoking status: Never Smoker  . Smokeless tobacco: Never Used  Substance and Sexual Activity  . Alcohol use: Yes    Alcohol/week: 0.0 standard drinks    Comment: rare  . Drug use: No  . Sexual activity: Not on file  Lifestyle  . Physical activity:    Days per week: Not on file    Minutes per session: Not on file  . Stress: Not on file  Relationships  . Social connections:    Talks on phone: Not on file  Gets together: Not on file    Attends religious service: Not on file    Active member of club or organization: Not on file    Attends meetings of clubs or organizations: Not on file    Relationship status: Not on file  Other Topics Concern  . 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 Margurite Auerbach   Moved from [place] up in [place]     Family History: The patient's family history includes Coronary artery disease (age of onset: 87) in his father; Hypothyroidism in his brother; Lung cancer (age of onset: 47) in his mother. There is no history of Colon cancer or Prostate cancer. ROS:   Please see the history of present illness.    All other systems reviewed and are negative.  Recent Labs: 04/21/2017: ALT 25; TSH 2.280 06/11/2017: BUN 15; Creatinine, Ser 0.87; Hemoglobin 13.1; Platelets 235; Potassium 4.6; Sodium 144  Recent Lipid Panel    Component Value Date/Time   CHOL 150 04/21/2017 1019   TRIG 114 04/21/2017 1019   HDL 53 04/21/2017 1019    CHOLHDL 2.8 04/21/2017 1019   CHOLHDL 2.7 09/16/2015 1038   VLDL 11 09/16/2015 1038   LDLCALC 74 04/21/2017 1019    Physical Exam:    VS:  BP 114/66 (BP Location: Left Arm, Patient Position: Sitting, Cuff Size: Normal)   Pulse 91   Ht 5\' 10"  (1.778 m)   Wt 185 lb 12.8 oz (84.3 kg)   SpO2 97%   BMI 26.66 kg/m     Wt Readings from Last 3 Encounters:  01/18/18 185 lb 12.8 oz (84.3 kg)  08/25/17 189 lb (85.7 kg)  07/21/17 192 lb 9.6 oz (87.4 kg)     GEN:  Well nourished, well developed in no acute distress HEENT: Normal NECK: No JVD; No carotid bruits LYMPHATICS: No lymphadenopathy CARDIAC: RRR, no murmurs, rubs, gallops RESPIRATORY:  Clear to auscultation without rales, wheezing or rhonchi  ABDOMEN: Soft, non-tender, non-distended MUSCULOSKELETAL:  No edema; No deformity  SKIN: Warm and dry NEUROLOGIC:  Alert and oriented x 3 PSYCHIATRIC:  Normal affect    Signed, Shirlee More, MD  01/18/2018 10:53 AM    [place]

## 2018-01-18 ENCOUNTER — Ambulatory Visit (INDEPENDENT_AMBULATORY_CARE_PROVIDER_SITE_OTHER): Payer: 59 | Admitting: Cardiology

## 2018-01-18 ENCOUNTER — Encounter: Payer: Self-pay | Admitting: Cardiology

## 2018-01-18 VITALS — BP 114/66 | HR 91 | Ht 70.0 in | Wt 185.8 lb

## 2018-01-18 DIAGNOSIS — I2510 Atherosclerotic heart disease of native coronary artery without angina pectoris: Secondary | ICD-10-CM

## 2018-01-18 DIAGNOSIS — E1142 Type 2 diabetes mellitus with diabetic polyneuropathy: Secondary | ICD-10-CM

## 2018-01-18 DIAGNOSIS — R0600 Dyspnea, unspecified: Secondary | ICD-10-CM

## 2018-01-18 DIAGNOSIS — Z794 Long term (current) use of insulin: Secondary | ICD-10-CM

## 2018-01-18 DIAGNOSIS — R0609 Other forms of dyspnea: Secondary | ICD-10-CM | POA: Diagnosis not present

## 2018-01-18 DIAGNOSIS — H52223 Regular astigmatism, bilateral: Secondary | ICD-10-CM | POA: Diagnosis not present

## 2018-01-18 DIAGNOSIS — I428 Other cardiomyopathies: Secondary | ICD-10-CM

## 2018-01-18 DIAGNOSIS — E785 Hyperlipidemia, unspecified: Secondary | ICD-10-CM | POA: Diagnosis not present

## 2018-01-18 DIAGNOSIS — H5213 Myopia, bilateral: Secondary | ICD-10-CM | POA: Diagnosis not present

## 2018-01-18 DIAGNOSIS — R931 Abnormal findings on diagnostic imaging of heart and coronary circulation: Secondary | ICD-10-CM

## 2018-01-18 DIAGNOSIS — H524 Presbyopia: Secondary | ICD-10-CM | POA: Diagnosis not present

## 2018-01-18 MED ORDER — EZETIMIBE 10 MG PO TABS: 10.0000 mg | ORAL_TABLET | Freq: Every day | ORAL | 3 refills | Status: DC

## 2018-01-18 MED FILL — EZETIMIBE 10 MG TABLET: 10 | 30 days supply | Qty: 30 | Fill #0

## 2018-01-18 NOTE — Addendum Note (Signed)
Addended by: Austin Miles on: 01/18/2018 11:20 AM   Modules accepted: Orders

## 2018-01-18 NOTE — Patient Instructions (Addendum)
Medication Instructions:  Your physician has recommended you make the following change in your medication:   START ezetimibe (zetia) 10 mg daily     Labwork: Your physician recommends that you return for lab work in 1 month: lipid panel. Please return to our office for lab work, no appointment needed. Be fast beforehand.  Testing/Procedures: You had an EKG today.   Your physician has requested that you have a stress echocardiogram. For further information please visit [email or web address]. Please follow instruction sheet as given.  Follow-Up: Your physician wants you to follow-up in: 6 months. You will receive a reminder letter in the mail two months in advance. If you don't receive a letter, please call our office to schedule the follow-up appointment.   If you need a refill on your cardiac medications before your next appointment, please call your pharmacy.   Thank you for choosing [facility]! Robyne Peers, RN [phone]    Exercise Stress Electrocardiogram An exercise stress electrocardiogram is a test to check how blood flows to your heart. It is done to find areas of poor blood flow. You will need to walk on a treadmill for this test. The electrocardiogram will record your heartbeat when you are at rest and when you are exercising. What happens before the procedure?  Do not have drinks with caffeine or foods with caffeine for 24 hours before the test, or as told by your doctor. This includes coffee, tea (even decaf tea), sodas, chocolate, and cocoa.  Follow your doctor's instructions about eating and drinking before the test.  Ask your doctor what medicines you should or should not take before the test. Take your medicines with water unless told by your doctor not to.  If you use an inhaler, bring it with you to the test.  Bring a snack to eat after the test.  Do not  smoke for 4 hours before the test.  Do not put lotions, powders, creams, or oils on your  chest before the test.  Wear comfortable shoes and clothing. What happens during the procedure?  You will have patches put on your chest. Small areas of your chest may need to be shaved. Wires will be connected to the patches.  Your heart rate will be watched while you are resting and while you are exercising.  You will walk on the treadmill. The treadmill will slowly get faster to raise your heart rate.  The test will take about 1-2 hours. What happens after the procedure?  Your heart rate and blood pressure will be watched after the test.  You may return to your normal diet, activities, and medicines or as told by your doctor. This information is not intended to replace advice given to you by your health care provider. Make sure you discuss any questions you have with your health care provider. Document Released: 10/07/2007 Document Revised: 12/18/2015 Document Reviewed: 12/26/2012 Elsevier Interactive Patient Education  2018 [employer].    Ezetimibe Tablets What is this medicine? EZETIMIBE (ez ET i mibe) blocks the absorption of cholesterol from the stomach. It can help lower blood cholesterol for patients who are at risk of getting heart disease or a stroke. It is only for patients whose cholesterol level is not controlled by diet. This medicine may be used for other purposes; ask your health care provider or pharmacist if you have questions. COMMON BRAND NAME(S): Zetia What should I tell my health care provider before I take this medicine? They need to know if you have any  of these conditions: -liver disease -an unusual or allergic reaction to ezetimibe, medicines, foods, dyes, or preservatives -pregnant or trying to get pregnant -breast-feeding How should I use this medicine? Take this medicine by mouth with a glass of water. Follow the directions on the prescription label. This medicine can be taken with or without food. Take your doses at regular intervals. Do not take  your medicine more often than directed. Talk to your pediatrician regarding the use of this medicine in children. Special care may be needed. Overdosage: If you think you have taken too much of this medicine contact a poison control center or emergency room at once. NOTE: This medicine is only for you. Do not share this medicine with others. What if I miss a dose? If you miss a dose, take it as soon as you can. If it is almost time for your next dose, take only that dose. Do not take double or extra doses. What may interact with this medicine? Do not take this medicine with any of the following medications: -fenofibrate -gemfibrozil This medicine may also interact with the following medications: -antacids -cyclosporine -herbal medicines like red yeast rice -other medicines to lower cholesterol or triglycerides This list may not describe all possible interactions. Give your health care provider a list of all the medicines, herbs, non-prescription drugs, or dietary supplements you use. Also tell them if you smoke, drink alcohol, or use illegal drugs. Some items may interact with your medicine. What should I watch for while using this medicine? Visit your doctor or health care professional for regular checks on your progress. You will need to have your cholesterol levels checked. If you are also taking some other cholesterol medicines, you will also need to have tests to make sure your liver is working properly. Tell your doctor or health care professional if you get any unexplained muscle pain, tenderness, or weakness, especially if you also have a fever and tiredness. You need to follow a low-cholesterol, low-fat diet while you are taking this medicine. This will decrease your risk of getting heart and blood vessel disease. Exercising and avoiding alcohol and smoking can also help. Ask your doctor or dietician for advice. What side effects may I notice from receiving this medicine? Side effects  that you should report to your doctor or health care professional as soon as possible: -allergic reactions like skin rash, itching or hives, swelling of the face, lips, or tongue -dark yellow or brown urine -unusually weak or tired -yellowing of the skin or eyes Side effects that usually do not require medical attention (report to your doctor or health care professional if they continue or are bothersome): -diarrhea -dizziness -headache -stomach upset or pain This list may not describe all possible side effects. Call your doctor for medical advice about side effects. You may report side effects to FDA at 1-800-FDA-1088. Where should I keep my medicine? Keep out of the reach of children. Store at room temperature between 15 and 30 degrees C (59 and 86 degrees F). Protect from moisture. Keep container tightly closed. Throw away any unused medicine after the expiration date. NOTE: This sheet is a summary. It may not cover all possible information. If you have questions about this medicine, talk to your doctor, pharmacist, or health care provider.  2018 Elsevier/Gold Standard (2011-10-26 15:39:09)

## 2018-01-19 DIAGNOSIS — H43391 Other vitreous opacities, right eye: Secondary | ICD-10-CM | POA: Diagnosis not present

## 2018-01-19 DIAGNOSIS — H43811 Vitreous degeneration, right eye: Secondary | ICD-10-CM | POA: Diagnosis not present

## 2018-01-19 DIAGNOSIS — H31091 Other chorioretinal scars, right eye: Secondary | ICD-10-CM | POA: Diagnosis not present

## 2018-01-24 MED FILL — METHYLPHENIDATE 20 MG TAB: 20 | 30 days supply | Qty: 60 | Fill #0

## 2018-01-24 MED FILL — CLOPIDOGREL 75 MG TABLET: 75 | 90 days supply | Qty: 90 | Fill #2

## 2018-02-14 MED FILL — GABAPENTIN 100 MG CAPS: 100 | 90 days supply | Qty: 90 | Fill #3

## 2018-02-14 MED FILL — EZETIMIBE 10 MG TABLET: 10 | 30 days supply | Qty: 30 | Fill #1

## 2018-02-25 DIAGNOSIS — E291 Testicular hypofunction: Secondary | ICD-10-CM | POA: Diagnosis not present

## 2018-02-25 DIAGNOSIS — E119 Type 2 diabetes mellitus without complications: Secondary | ICD-10-CM | POA: Diagnosis not present

## 2018-02-25 DIAGNOSIS — Z955 Presence of coronary angioplasty implant and graft: Secondary | ICD-10-CM | POA: Diagnosis not present

## 2018-02-25 DIAGNOSIS — J4520 Mild intermittent asthma, uncomplicated: Secondary | ICD-10-CM | POA: Diagnosis not present

## 2018-02-25 DIAGNOSIS — N529 Male erectile dysfunction, unspecified: Secondary | ICD-10-CM | POA: Diagnosis not present

## 2018-02-25 DIAGNOSIS — F988 Other specified behavioral and emotional disorders with onset usually occurring in childhood and adolescence: Secondary | ICD-10-CM | POA: Diagnosis not present

## 2018-02-25 DIAGNOSIS — G629 Polyneuropathy, unspecified: Secondary | ICD-10-CM | POA: Diagnosis not present

## 2018-02-25 DIAGNOSIS — Z5181 Encounter for therapeutic drug level monitoring: Secondary | ICD-10-CM | POA: Diagnosis not present

## 2018-02-25 DIAGNOSIS — E7800 Pure hypercholesterolemia, unspecified: Secondary | ICD-10-CM | POA: Diagnosis not present

## 2018-02-25 MED FILL — TESTOSTERONE 50 MG/5GM (1%): 50 MG/5GM | 45 days supply | Qty: 450 | Fill #0

## 2018-02-25 MED FILL — PRAVASTATIN NA 40 MG TAB: 40 | 30 days supply | Qty: 30 | Fill #0

## 2018-02-25 MED FILL — JANUMET 50-1,000 MG TABLET: 50-1000 | 30 days supply | Qty: 60 | Fill #1

## 2018-02-25 MED FILL — LOSARTAN POTASSIUM 25 MG TA: 25 | 90 days supply | Qty: 90 | Fill #1

## 2018-03-17 MED FILL — MONTELUKAST SOD 10 MG TAB: 10 | 90 days supply | Qty: 90 | Fill #0

## 2018-03-18 MED FILL — EZETIMIBE 10 MG TABLET: 10 | 30 days supply | Qty: 30 | Fill #2

## 2018-04-04 MED FILL — PRAVASTATIN NA 40 MG TAB: 40 | 30 days supply | Qty: 30 | Fill #0

## 2018-04-06 MED FILL — METHYLPHENIDATE 20 MG TAB: 20 | 30 days supply | Qty: 30 | Fill #0

## 2018-04-11 MED FILL — JANUMET 50-1,000 MG TABLET: 50-1000 | 30 days supply | Qty: 60 | Fill #2

## 2018-04-13 DIAGNOSIS — H3122 Choroidal dystrophy (central areolar) (generalized) (peripapillary): Secondary | ICD-10-CM | POA: Diagnosis not present

## 2018-04-13 DIAGNOSIS — H33321 Round hole, right eye: Secondary | ICD-10-CM | POA: Diagnosis not present

## 2018-04-13 DIAGNOSIS — H43391 Other vitreous opacities, right eye: Secondary | ICD-10-CM | POA: Diagnosis not present

## 2018-04-13 DIAGNOSIS — H31091 Other chorioretinal scars, right eye: Secondary | ICD-10-CM | POA: Diagnosis not present

## 2018-04-13 DIAGNOSIS — H35371 Puckering of macula, right eye: Secondary | ICD-10-CM | POA: Diagnosis not present

## 2018-04-13 DIAGNOSIS — H4423 Degenerative myopia, bilateral: Secondary | ICD-10-CM | POA: Diagnosis not present

## 2018-04-13 DIAGNOSIS — H43811 Vitreous degeneration, right eye: Secondary | ICD-10-CM | POA: Diagnosis not present

## 2018-04-18 ENCOUNTER — Ambulatory Visit: Payer: 59 | Admitting: Internal Medicine

## 2018-04-18 ENCOUNTER — Encounter: Payer: Self-pay | Admitting: Internal Medicine

## 2018-04-18 ENCOUNTER — Ambulatory Visit (HOSPITAL_BASED_OUTPATIENT_CLINIC_OR_DEPARTMENT_OTHER)
Admission: RE | Admit: 2018-04-18 | Discharge: 2018-04-18 | Disposition: A | Discharge status: Home / Self Care | Payer: 59 | Source: Ambulatory Visit | Attending: Internal Medicine | Admitting: Internal Medicine

## 2018-04-18 VITALS — BP 124/74 | HR 72 | Temp 98.0°F | Resp 16 | Ht 70.0 in | Wt 189.4 lb

## 2018-04-18 DIAGNOSIS — M25572 Pain in left ankle and joints of left foot: Secondary | ICD-10-CM | POA: Diagnosis not present

## 2018-04-18 DIAGNOSIS — M545 Low back pain, unspecified: Secondary | ICD-10-CM

## 2018-04-18 DIAGNOSIS — G8929 Other chronic pain: Secondary | ICD-10-CM

## 2018-04-18 DIAGNOSIS — M7732 Calcaneal spur, left foot: Secondary | ICD-10-CM | POA: Diagnosis not present

## 2018-04-18 MED ORDER — HYDROCODONE-ACETAMINOPHEN 5-325 MG PO TABS: 1.0000 | ORAL_TABLET | Freq: Every evening | ORAL | 0 refills | Status: DC | PRN

## 2018-04-18 MED FILL — HYDROCODON-APAP 5-325: 5-325 | 15 days supply | Qty: 30 | Fill #0

## 2018-04-18 NOTE — Progress Notes (Signed)
Pre visit review using our clinic review tool, if applicable. No additional management support is needed unless otherwise documented below in the visit note. 

## 2018-04-18 NOTE — Patient Instructions (Signed)
  STOP BY THE FIRST FLOOR:  get the XR   We are referring you to Dr. Ellene Route  We are referring you to Dr. Barbaraann Barthel  For pain: Tylenol as needed during the daytime For bedtime pain, hydrocodone (it has Tylenol on it already).

## 2018-04-18 NOTE — Progress Notes (Signed)
Subjective:    Patient ID: Reginald Rios, male    DOB: Nov 30, 1955, 62 y.o.   MRN: [id number]  DOS:  04/18/2018 Type of visit - description : Acute visit. Patient is to get established with Dr. Nani Ravens soon but needed help today.  He has 1 to 2 years history of back pain on and off, last episode happened about 4 days ago, at some point had some pain radiating to the left leg but radiation is gone, he now has R>L back pain.  Also, developed pain at the plantar aspect of the mid-L-foot. Does not recall any injury although he is very active teaching tennis.  Pain is worse when he put pressure on the foot. He does not recall if the area was swollen or red but it was very tender to touch.   Review of Systems Denies dysuria or gross hematuria No fever chills  Past Medical History:  Diagnosis Date  . ADD (attention deficit disorder)   . Anemia   . Arthritis   . CAD (coronary artery disease)    2/19 PCI/DESx1 to Lcx  . Diabetes mellitus without complication ([place])   . Exercise-induced asthma   . Heart murmur   . Hyperlipidemia    family hx of high cholesterol  . Hypogonadism in male   . Low back pain   . Osteoarthritis     Past Surgical History:  Procedure Laterality Date  . CORONARY STENT INTERVENTION N/A 06/23/2017   Procedure: CORONARY STENT INTERVENTION;  Surgeon: Leonie Man, MD;  Location: [facility];  Service: Cardiovascular;  Laterality: N/A;  . INTRAVASCULAR PRESSURE WIRE/FFR STUDY N/A 06/23/2017   Procedure: INTRAVASCULAR PRESSURE WIRE/FFR STUDY;  Surgeon: Leonie Man, MD;  Location: [facility];  Service: Cardiovascular;  Laterality: N/A;  . KNEE ARTHROSCOPY    . LEFT HEART CATH AND CORONARY ANGIOGRAPHY N/A 06/23/2017   Procedure: LEFT HEART CATH AND CORONARY ANGIOGRAPHY;  Surgeon: Leonie Man, MD;  Location: [facility];  Service: Cardiovascular;  Laterality: N/A;  . TENOTOMY ACHILLES TENDON      Social History    Socioeconomic History  . Marital status: Married    Spouse name: Not on file  . Number of children: Not on file  . Years of education: Not on file  . Highest education level: Not on file  Occupational History  . Occupation: Air cabin crew   Social Needs  . Financial resource strain: Not on file  . Food insecurity:    Worry: Not on file    Inability: Not on file  . Transportation needs:    Medical: Not on file    Non-medical: Not on file  Tobacco Use  . Smoking status: Never Smoker  . Smokeless tobacco: Never Used  Substance and Sexual Activity  . Alcohol use: Yes    Alcohol/week: 0.0 standard drinks    Comment: rare  . Drug use: No  . Sexual activity: Not on file  Lifestyle  . Physical activity:    Days per week: Not on file    Minutes per session: Not on file  . Stress: Not on file  Relationships  . Social connections:    Talks on phone: Not on file    Gets together: Not on file    Attends religious service: Not on file    Active member of club or organization: Not on file    Attends meetings of clubs or organizations: Not on file    Relationship  status: Not on file  . Intimate partner violence:    Fear of current or ex partner: Not on file    Emotionally abused: Not on file    Physically abused: Not on file    Forced sexual activity: Not on file  Other Topics Concern  . Not on file  Social History Narrative   Previously worked as Geophysicist/field seismologist   Currently working at [employer] clinic   Married 30 years   2 children ages 107, 69   Wife is psychiatrist - Dr. Margurite Auerbach   Moved from [place] up in [place] as of 04/18/2018      Reactions   Invokana [canagliflozin] Rash   Statins Rash, Other (See Comments)   Muscle aches    Lisinopril Cough      Medication List       Accurate as of April 18, 2018 11:59 PM. Always use your most recent med list.        ADVAIR HFA 115-21 MCG/ACT inhaler Generic drug:   fluticasone-salmeterol Inhale 2 puffs into the lungs 2 (two) times daily as needed.   aspirin EC 81 MG tablet Take 81 mg by mouth daily.   clopidogrel 75 MG tablet Commonly known as:  PLAVIX Take 1 tablet (75 mg total) by mouth daily with breakfast.   diclofenac sodium 1 % Gel Commonly known as:  VOLTAREN Apply 4 g topically 4 (four) times daily.   ezetimibe 10 MG tablet Commonly known as:  ZETIA Take 1 tablet (10 mg total) by mouth daily.   gabapentin 100 MG capsule Commonly known as:  NEURONTIN Take 1 capsule (100 mg total) by mouth at bedtime.   HYDROcodone-acetaminophen 5-325 MG tablet Commonly known as:  NORCO/VICODIN Take 1-2 tablets by mouth at bedtime as needed.   JANUMET 50-1000 MG tablet Generic drug:  sitaGLIPtin-metformin TAKE 1 TABLET BY MOUTH TWICE DAILY WITH A MEAL   losartan 25 MG tablet Commonly known as:  COZAAR Take 1 tablet (25 mg total) by mouth daily.   methylphenidate 20 MG tablet Commonly known as:  RITALIN Take 20 mg by mouth daily.   montelukast 10 MG tablet Commonly known as:  SINGULAIR Take 10 mg by mouth daily.   nateglinide 120 MG tablet Commonly known as:  STARLIX Take 1 tablet (120 mg total) by mouth daily.   nitroGLYCERIN 0.4 MG SL tablet Commonly known as:  NITROSTAT Place 1 tablet (0.4 mg total) under the tongue every 5 (five) minutes as needed.   pravastatin 20 MG tablet Commonly known as:  PRAVACHOL Take 20 mg by mouth daily.   sildenafil 50 MG tablet Commonly known as:  VIAGRA Take 1 tablet (50 mg total) by mouth daily as needed for erectile dysfunction.   Testosterone 25 MG/2.5GM (1%) Gel Place onto the skin.   VENTOLIN HFA 108 (90 Base) MCG/ACT inhaler Generic drug:  albuterol Inhale 1 puff into the lungs every 6 (six) hours as needed (cough).           Objective:   Physical Exam BP 124/74 (BP Location: Left Arm, Patient Position: Sitting, Cuff Size: Normal)   Pulse 72   Temp 98 F (36.7 C) (Oral)    Resp 16   Ht 5\' 10"  (1.778 m)   Wt 189 lb 6 oz (85.9 kg)   SpO2 97%   BMI 27.17 kg/m  General:   Well developed, NAD, BMI noted. HEENT:  Normocephalic . Face symmetric, atraumatic Lower extremities:  No pretibial edema bilaterally.  Good pedal pulses MSK: Right foot and ankle normal Left foot and ankle: Normal to inspection and palpation.  Forced flexion of the left ankle is a slightly painful. Skin: Not pale. Not jaundice Neurologic:  alert & oriented X3.  Speech normal, gait appropriate for age and unassisted. DTRs symmetric (I did not do the ankles because he had an Achilles tendon surgery before) Straight leg test negative. Psych--  Cognition and judgment appear intact.  Cooperative with normal attention span and concentration.  Behavior appropriate. No anxious or depressed appearing.      Assessment & Plan:   62 year old gentleman, PMH includes:  DM, CAD, DJD, neck surgery ~05/2017 He presents with:  Chronic back pain: The issue is going on for "1 or 2 years".  Exam today is nonfocal however few days ago had some symptoms consistent with radiculopathy.  The patient is well-known to Dr. Ellene Route who operated on his neck.  Request a referral.  Will do.  Left foot pain: Exam is benign, DDX include stress fracture, ankle sprain, plantar fasciitis/metatarsalgia but there is no classic symptoms for a specific condition. Plan: Pain control with Tylenol, hydrocodone at night only (has used it before), x-rays, refer to sports medicine

## 2018-04-22 MED FILL — EZETIMIBE 10 MG TABS: 10 | 30 days supply | Qty: 30 | Fill #3

## 2018-04-25 ENCOUNTER — Other Ambulatory Visit (HOSPITAL_COMMUNITY): Payer: Self-pay | Admitting: Cardiology

## 2018-04-25 MED FILL — CLOPIDOGREL 75 MG TABLET: 75 | 90 days supply | Qty: 90 | Fill #0

## 2018-04-29 MED FILL — TESTOSTERONE 50 MG/5GM (1%): 50 MG/5GM | 45 days supply | Qty: 450 | Fill #1

## 2018-05-03 ENCOUNTER — Ambulatory Visit: Payer: 59 | Admitting: Family Medicine

## 2018-05-11 ENCOUNTER — Encounter: Payer: Self-pay | Admitting: Family Medicine

## 2018-05-11 ENCOUNTER — Ambulatory Visit: Payer: 59 | Admitting: Family Medicine

## 2018-05-11 VITALS — BP 145/85 | HR 89 | Ht 70.0 in | Wt 189.0 lb

## 2018-05-11 DIAGNOSIS — M5416 Radiculopathy, lumbar region: Secondary | ICD-10-CM

## 2018-05-11 MED ORDER — PREDNISONE 10 MG PO TABS: ORAL_TABLET | ORAL | 0 refills | Status: DC

## 2018-05-11 MED FILL — predniSONE 10 MG TABS: 10 | 6 days supply | Qty: 21 | Fill #0

## 2018-05-11 NOTE — Patient Instructions (Addendum)
Your pain is due to a combination of factors but the primary issue is radiculopathy from your back (pinched nerve). This is contributing to weakness in your leg especially knee extension and hip side raises. You're getting compensatory pain from arthritis in your knee, SI joint inflammation, ankle arthritis too. Consider seeing Dr. Ellene Route for this, possible ESI on the left side. Start prednisone dose pack x 6 days. Start physical therapy and do home exercises on days you don't go to therapy. Follow up with me in 5-6 weeks.

## 2018-05-12 MED FILL — PRAVASTATIN NA 40 MG TAB: 40 | 60 days supply | Qty: 60 | Fill #1

## 2018-05-16 ENCOUNTER — Encounter: Payer: Self-pay | Admitting: Family Medicine

## 2018-05-16 NOTE — Progress Notes (Signed)
PCP and consultation requested by: [name], DO  Subjective:   HPI: Patient is a 63 y.o. male here for left leg pain.  Patient reports for several weeks (though worse past 2 weeks) he's had pain posterior left hip into left leg. Feels like a shooting pain, knife-like into his hip area. Associated pain anterior ankle, knee. Not seen any swelling but top of his foot has been red. Sees a chiropractor which has helped some. Taking norco as needed. Told he has bulging discs in his low back. Pain level 5/10. No other skin changes. No numbness, bowel/bladder dysfunction.  Past Medical History:  Diagnosis Date  . ADD (attention deficit disorder)   . Anemia   . Arthritis   . CAD (coronary artery disease)    2/19 PCI/DESx1 to Lcx  . Diabetes mellitus without complication ([place])   . Exercise-induced asthma   . Heart murmur   . Hyperlipidemia    family hx of high cholesterol  . Hypogonadism in male   . Low back pain   . Osteoarthritis     Current Outpatient Medications on File Prior to Visit  Medication Sig Dispense Refill  . ADVAIR HFA 115-21 MCG/ACT inhaler Inhale 2 puffs into the lungs 2 (two) times daily as needed.   3  . aspirin EC 81 MG tablet Take 81 mg by mouth daily.    . clopidogrel (PLAVIX) 75 MG tablet TAKE 1 TABLET (75 MG TOTAL) BY MOUTH DAILY WITH BREAKFAST. 90 tablet 2  . diclofenac sodium (VOLTAREN) 1 % GEL Apply 4 g topically 4 (four) times daily. (Patient taking differently: Apply 4 g topically 4 (four) times daily as needed. ) 100 g 2  . ezetimibe (ZETIA) 10 MG tablet Take 1 tablet (10 mg total) by mouth daily. 30 tablet 3  . gabapentin (NEURONTIN) 100 MG capsule Take 1 capsule (100 mg total) by mouth at bedtime. 90 capsule 3  . HYDROcodone-acetaminophen (NORCO/VICODIN) 5-325 MG tablet Take 1-2 tablets by mouth at bedtime as needed. 30 tablet 0  . JANUMET 50-1000 MG per tablet TAKE 1 TABLET BY MOUTH TWICE DAILY WITH A MEAL 180 tablet 1  . losartan  (COZAAR) 25 MG tablet Take 1 tablet (25 mg total) by mouth daily. 30 tablet 8  . methylphenidate (RITALIN) 20 MG tablet Take 20 mg by mouth daily.    . montelukast (SINGULAIR) 10 MG tablet Take 10 mg by mouth daily.  0  . nateglinide (STARLIX) 120 MG tablet Take 1 tablet (120 mg total) by mouth daily. (Patient taking differently: Take 120 mg by mouth daily as needed (for blood sugar greater than 150). ) 90 tablet 1  . nitroGLYCERIN (NITROSTAT) 0.4 MG SL tablet Place 1 tablet (0.4 mg total) under the tongue every 5 (five) minutes as needed. (Patient not taking: Reported on 04/18/2018) 25 tablet 2  . pravastatin (PRAVACHOL) 20 MG tablet Take 20 mg by mouth daily.    . sildenafil (VIAGRA) 50 MG tablet Take 1 tablet (50 mg total) by mouth daily as needed for erectile dysfunction. 5 tablet 10  . Testosterone 25 MG/2.5GM (1%) GEL Place onto the skin.    . VENTOLIN HFA 108 (90 Base) MCG/ACT inhaler Inhale 1 puff into the lungs every 6 (six) hours as needed (cough). 2 each 1   No current facility-administered medications on file prior to visit.     Past Surgical History:  Procedure Laterality Date  . CORONARY STENT INTERVENTION N/A 06/23/2017   Procedure: CORONARY STENT INTERVENTION;  Surgeon: Leonie Man, MD;  Location: [facility];  Service: Cardiovascular;  Laterality: N/A;  . INTRAVASCULAR PRESSURE WIRE/FFR STUDY N/A 06/23/2017   Procedure: INTRAVASCULAR PRESSURE WIRE/FFR STUDY;  Surgeon: Leonie Man, MD;  Location: [facility];  Service: Cardiovascular;  Laterality: N/A;  . KNEE ARTHROSCOPY    . LEFT HEART CATH AND CORONARY ANGIOGRAPHY N/A 06/23/2017   Procedure: LEFT HEART CATH AND CORONARY ANGIOGRAPHY;  Surgeon: Leonie Man, MD;  Location: [facility];  Service: Cardiovascular;  Laterality: N/A;  . TENOTOMY ACHILLES TENDON      Allergies  Allergen Reactions  . Invokana [Canagliflozin] Rash  . Statins Rash and Other (See Comments)    Muscle aches   .  Lisinopril Cough    Social History   Socioeconomic History  . Marital status: Married    Spouse name: Not on file  . Number of children: Not on file  . Years of education: Not on file  . Highest education level: Not on file  Occupational History  . Occupation: Air cabin crew   Social Needs  . Financial resource strain: Not on file  . Food insecurity:    Worry: Not on file    Inability: Not on file  . Transportation needs:    Medical: Not on file    Non-medical: Not on file  Tobacco Use  . Smoking status: Never Smoker  . Smokeless tobacco: Never Used  Substance and Sexual Activity  . Alcohol use: Yes    Alcohol/week: 0.0 standard drinks    Comment: rare  . Drug use: No  . Sexual activity: Not on file  Lifestyle  . Physical activity:    Days per week: Not on file    Minutes per session: Not on file  . Stress: Not on file  Relationships  . Social connections:    Talks on phone: Not on file    Gets together: Not on file    Attends religious service: Not on file    Active member of club or organization: Not on file    Attends meetings of clubs or organizations: Not on file    Relationship status: Not on file  . Intimate partner violence:    Fear of current or ex partner: Not on file    Emotionally abused: Not on file    Physically abused: Not on file    Forced sexual activity: Not on file  Other Topics Concern  . 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 Margurite Auerbach   Moved from [place] up in [place]    Family History  Problem Relation Age of Onset  . Lung cancer Mother 74  . Coronary artery disease Father 12  . Hypothyroidism Brother   . Colon cancer Neg Hx   . Prostate cancer Neg Hx     BP (!) 145/85   Pulse 89   Ht 5\' 10"  (1.778 m)   Wt 189 lb (85.7 kg)   BMI 27.12 kg/m   Review of  Systems: See HPI above.     Objective:  Physical Exam:  Gen: NAD, comfortable in exam room  Back: No gross deformity, scoliosis. TTP left SI joint, paraspinal regions.  No midline or bony TTP. ROM limited to 10 degrees extension, 70 flexion. Strength LEs  5/5 all muscle groups except 4/5 left knee extension and hip abduction.   2+ MSRs in patellar and achilles tendons, equal bilaterally. Negative SLRs. Sensation intact to light touch bilaterally.  Left hip: No deformity. FROM with 5/5 strength except 4/5 hip abduction. No tenderness to palpation. NVI distally. Negative logroll bilateral hips Positive faber, negative piriformis stretch.  Left knee: No gross deformity, ecchymoses, swelling. No TTP. FROM. Negative ant/post drawers. Negative valgus/varus testing. Negative lachmans. Negative mcmurrays, apleys. NV intact distally.  Left ankle: No gross deformity, swelling, ecchymoses FROM with 5/5 strength all directions TTP anteriorly over ankle joint. Negative ant drawer and talar tilt.   Negative syndesmotic compression. Thompsons test negative. NV intact distally.  Right hip: No deformity. FROM with 5/5 strength. No tenderness to palpation. NVI distally.   Assessment & Plan:  1. Left leg pain - due to a number of factors though believe radiculopathy is main one here.  Compensatory pain in left SI joint, knee, ankle.  Encouraged to see Dr. Ellene Route for evaluation, consider ESI on left side.  Start prednisone dose pack and physical therapy.  F/u in 5-6 weeks.

## 2018-05-16 NOTE — Addendum Note (Signed)
Addended by: Dene Gentry on: 05/16/2018 09:05 AM   Modules accepted: Level of Service

## 2018-05-18 ENCOUNTER — Other Ambulatory Visit: Payer: Self-pay

## 2018-05-18 ENCOUNTER — Other Ambulatory Visit: Payer: Self-pay | Admitting: Cardiology

## 2018-05-18 ENCOUNTER — Ambulatory Visit: Payer: 59 | Attending: Family Medicine | Admitting: Physical Therapy

## 2018-05-18 ENCOUNTER — Encounter: Payer: Self-pay | Admitting: Physical Therapy

## 2018-05-18 VITALS — BP 105/68 | HR 81

## 2018-05-18 DIAGNOSIS — M5442 Lumbago with sciatica, left side: Secondary | ICD-10-CM | POA: Insufficient documentation

## 2018-05-18 DIAGNOSIS — G8929 Other chronic pain: Secondary | ICD-10-CM | POA: Diagnosis not present

## 2018-05-18 DIAGNOSIS — R29898 Other symptoms and signs involving the musculoskeletal system: Secondary | ICD-10-CM | POA: Diagnosis not present

## 2018-05-18 DIAGNOSIS — R262 Difficulty in walking, not elsewhere classified: Secondary | ICD-10-CM | POA: Insufficient documentation

## 2018-05-18 DIAGNOSIS — M6281 Muscle weakness (generalized): Secondary | ICD-10-CM | POA: Insufficient documentation

## 2018-05-18 MED FILL — METHYLPHENIDATE 20 MG TAB: 20 | 30 days supply | Qty: 30 | Fill #0

## 2018-05-18 NOTE — Therapy (Signed)
[facility] [address]  [place], [place], [address] Phone: [phone]   Fax:  [phone]  Physical Therapy Evaluation  Patient Details  Name: Reginald Rios MRN: [id number] Date of Birth: 1956/02/06 Referring Provider (PT): Karlton Lemon, MD   Encounter Date: 05/18/2018  PT End of Session - 05/18/18 1157    Visit Number  1    Number of Visits  17    Date for PT Re-Evaluation  07/13/18    Authorization Type  [facility]    PT Start Time  1102    PT Stop Time  1153    PT Time Calculation (min)  51 min    Activity Tolerance  Patient tolerated treatment well;Patient limited by pain    Behavior During Therapy  [facility] for tasks assessed/performed       Past Medical History:  Diagnosis Date  . ADD (attention deficit disorder)   . Anemia   . Arthritis   . CAD (coronary artery disease)    2/19 PCI/DESx1 to Lcx  . Diabetes mellitus without complication ([place])   . Exercise-induced asthma   . Heart murmur   . Hyperlipidemia    family hx of high cholesterol  . Hypogonadism in male   . Low back pain   . Osteoarthritis     Past Surgical History:  Procedure Laterality Date  . CORONARY STENT INTERVENTION N/A 06/23/2017   Procedure: CORONARY STENT INTERVENTION;  Surgeon: Leonie Man, MD;  Location: [place] [facility];  Service: Cardiovascular;  Laterality: N/A;  . INTRAVASCULAR PRESSURE WIRE/FFR STUDY N/A 06/23/2017   Procedure: INTRAVASCULAR PRESSURE WIRE/FFR STUDY;  Surgeon: Leonie Man, MD;  Location: [place] [facility];  Service: Cardiovascular;  Laterality: N/A;  . KNEE ARTHROSCOPY    . LEFT HEART CATH AND CORONARY ANGIOGRAPHY N/A 06/23/2017   Procedure: LEFT HEART CATH AND CORONARY ANGIOGRAPHY;  Surgeon: Leonie Man, MD;  Location: [place] [facility];  Service: Cardiovascular;  Laterality: N/A;  . 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 LBP alternates between R and L, with intermittent pain across back and radiation down lateral L LE down to foot. Recent flare up started about 1 month ago, without inciting event. Placed on prednisone, which helped. Now feeling a little better than recent onset of flare. Worse with sitting, standing, prolonged walking- especially on rough terrain, L sidelying. Better with R sidelying, ice, and prednisone. Intermittent N/T down L LE. Patient also reports L knee and foot arthritis that may be contributing to L LE pain.     Pertinent History  OA, LBP, HLD, heart murmur, asthma, DM, CAD, anemia, ADD, achilles tenotomy, L heart cath & angio 06/2017, coronary stent 06/2017    How long can you sit comfortably?  10 min    How long can you stand comfortably?  30 min    How long can you walk comfortably?  20 min    Diagnostic tests  none    Patient Stated Goals  getting pain levels down    Currently in Pain?  Yes    Pain Score  7     Pain Location  Back    Pain Orientation  Left;Lower    Pain Descriptors /  Indicators  Aching;Sharp    Pain Type  Chronic pain    Pain Radiating Towards  lateral L hip         [facility] PT Assessment - 05/18/18 1122      Assessment   Medical Diagnosis  Lumbar radiculopathy    Referring Provider (PT)  Karlton Lemon, MD    Onset Date/Surgical Date  04/17/18    Next MD Visit  06/15/18    Prior Therapy  Yes- for neck      Precautions   Precautions  --   hx of positive stress test in 2019     Restrictions   Weight Bearing Restrictions  No      Balance Screen   Has the patient fallen in the past 6 months  Yes    How many times?  2   tripped over feet- sustained mild concusion   Has the patient had a decrease in activity level because of a fear of falling?   Yes    Is the patient reluctant to leave their home because of a fear of  falling?   No      Home Environment   Living Environment  Private residence    Type of [place] Access  Stairs to enter    Entrance Stairs-Number of Steps  3    Entrance Stairs-Rails  None    Home Layout  Two level    Alternate Level Stairs-Number of Steps  19    Alternate Level Stairs-Rails  Left    Home Equipment  None      Prior Function   Level of Independence  Independent    Vocation  Part time employment    Musician- standing    Leisure  playing tennis, hiking      Cognition   Overall Cognitive Status  Within Functional Limits for tasks assessed      Observation/Other Assessments   Focus on Therapeutic Outcomes (FOTO)   Lumbar: 52 (48% limited, 40% predicted)      Sensation   Light Touch  Appears Intact   B hand and toes N/T     Coordination   Gross Motor Movements are Fluid and Coordinated  Yes      Posture/Postural Control   Posture/Postural Control  Postural limitations    Postural Limitations  Rounded Shoulders;Forward head;Posterior pelvic tilt      ROM / Strength   AROM / PROM / Strength  AROM;Strength      AROM   AROM Assessment Site  Lumbar    Lumbar Flexion  distal shin   mild L LBP   Lumbar Extension  severely limited   mild R neck pain   Lumbar - Right Side Bend  mid thigh   R LBP   Lumbar - Left Side Bend  mid thigh   R LBP   Lumbar - Right Rotation  moderately limited   R LBP   Lumbar - Left Rotation  mildly limited      Strength   Strength Assessment Site  Hip;Knee;Ankle    Right/Left Hip  Right;Left    Right Hip Flexion  4/5    Right Hip ABduction  4/5    Right Hip ADduction  4-/5    Left Hip Flexion  4/5    Left Hip ABduction  4/5    Left Hip ADduction  4-/5    Right/Left Knee  Right;Left    Right Knee Flexion  5/5    Right Knee Extension  5/5    Left Knee Flexion  5/5    Left Knee Extension  5/5    Right/Left Ankle  Right;Left    Right Ankle Dorsiflexion  4/5    Right Ankle Plantar  Flexion  4/5    Left Ankle Dorsiflexion  4/5   delayed dorsal pain   Left Ankle Plantar Flexion  4/5   delayed dorsal pain     Flexibility   Soft Tissue Assessment /Muscle Length  yes    Hamstrings  B moderately tight    Quadriceps  B mildly tight      Palpation   Palpation comment  TTP in B thoracolumbar paraspinals, L>R; severely tight and tender in L superior and lateral glute, B HS      Ambulation/Gait   Gait Pattern  Step-through pattern;Decreased weight shift to left;Decreased hip/knee flexion - left;Decreased stance time - left;Trunk rotated posteriorly on right;Trunk flexed    Gait velocity  mildly decreased                Objective measurements completed on examination: See above findings.              PT Education - 05/18/18 1156    Education Details  prognosis, POC, HEP    Person(s) Educated  Patient    Methods  Explanation;Demonstration;Tactile cues;Verbal cues;Handout    Comprehension  Verbalized understanding;Returned demonstration       PT Short Term Goals - 05/18/18 1309      PT SHORT TERM GOAL #1   Title  Patient to be independent with initial HEP.    Time  4    Period  Weeks    Status  New    Target Date  06/15/18        PT Long Term Goals - 05/18/18 1309      PT LONG TERM GOAL #1   Title  Patient to be independent with advanced HEP.    Time  8    Period  Weeks    Status  New    Target Date  07/13/18      PT LONG TERM GOAL #2   Title  Patient to demonstrated [facility] and pain-free lumbar AROM.    Time  8    Period  Weeks    Status  New    Target Date  07/13/18      PT LONG TERM GOAL #3   Title  Patient to demonstrate >=4+/5 strength in B LEs.    Time  8    Period  Weeks    Status  New    Target Date  07/13/18      PT LONG TERM GOAL #4   Title  Patient to report tolerance for 2 hours of sitting without pain limiting.     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     Time  8    Period  Weeks    Status  New    Target Date  07/13/18             Plan - 05/18/18 1303    Clinical Impression Statement  Patient is a 63y/o M presenting to OPPT with c/o acute on chronic B  LBP with radiation into L LE. Reports initial onset 20 years ago with recent flare of 1 month duration without inciting event. Pain starts in B LB- worse on L, with intermittent radiation of pain and N/T along lateral L LE down to dorsum of foot. Worse with sitting, standing, prolonged walking- especially on rough terrain, L sidelying. Better with R sidelying, ice, and prednisone. Patient today with limited and painful lumbar AROM, decreased LE strength, decreased flexibility, gait deviations, and considerable tenderness and soft tissue restriction to palpation of B thoracolumbar paraspinals, L superior and lateral glute, B HS. Educated on gentle stretching and ROM HEP and advised to avoid activity that aggravates his back such as tennis. Patient reported understanding. Would benefit from skilled PT services 2x/week for 8 weeks to address aforementioned impairments.     Clinical Presentation  Stable    Clinical Decision Making  Low    Rehab Potential  Good    PT Frequency  2x / week    PT Duration  8 weeks    PT Treatment/Interventions  ADLs/Self Care Home Management;Cryotherapy;Electrical Stimulation;Functional mobility training;Stair training;Gait training;DME Instruction;Ultrasound;Traction;Moist Heat;Therapeutic activities;Therapeutic exercise;Balance training;Neuromuscular re-education;Patient/family education;Orthotic Fit/Training;Passive range of motion;Manual techniques;Dry needling;Energy conservation;Splinting;Taping    PT Next Visit Plan  reassess HEP    Consulted and Agree with Plan of Care  Patient       Patient will benefit from skilled therapeutic intervention in order to improve the  following deficits and impairments:  Decreased range of motion, Difficulty walking, Increased muscle spasms, Decreased activity tolerance, Pain, Improper body mechanics, Impaired flexibility, Hypomobility, Decreased balance, Decreased strength, Postural dysfunction  Visit Diagnosis: Chronic bilateral low back pain with left-sided sciatica  Other symptoms and signs involving the musculoskeletal system  Difficulty in walking, not elsewhere classified  Muscle weakness (generalized)     Problem List Patient Active Problem List   Diagnosis Date Noted  . Palpitations 07/06/2017  . CAD (coronary artery disease) 07/05/2017  . Family history of early CAD 07/05/2017  . Abnormal findings on diagnostic imaging of cardiovascular system 06/23/2017  . Low vitamin D level 04/22/2017  . Pain in right ankle and joints of right foot 10/05/2016  . Hemarthrosis of right elbow 02/03/2016  . Small thenar eminence 02/03/2016  . Cervical disc disorder with radiculopathy of cervical region 10/07/2015  . Incomplete rotator cuff tear 09/10/2015  . Nonischemic cardiomyopathy ([place]) 05/15/2015  . Fatigue 05/15/2015  . Dyspnea on exertion 05/15/2015  . Decreased cardiac ejection fraction 04/02/2015  . Strain of latissimus dorsi muscle 01/21/2015  . Shoulder bursitis 10/12/2014  . Stenosis of lateral recess of lumbar spine 10/12/2014  . BPPV (benign paroxysmal positional vertigo) 08/14/2014  . Hyperlipidemia 01/19/2014  . Anemia, unspecified 01/19/2014  . ADD (attention deficit disorder) 01/19/2014  . Type 2 diabetes mellitus ([place]) 12/22/2013  . Hypogonadism in male 12/22/2013  . Arthritis of hand, left 12/22/2013    Janene Harvey, PT, DPT 05/18/18 1:13 PM    [facility] [address]  [place], [place], [address] Phone: [phone]   Fax:  [phone]  Name: Reginald Rios MRN: [id number] Date of Birth: 08/22/1955

## 2018-05-18 NOTE — Telephone Encounter (Signed)
Dr. Meda Coffee is no longer seeing this pt, for he requested for a different Cardiologist in our group.  Pt see's Dr. Bettina Gavia in [facility].  Please refer to his office for refill request.  Thanks!

## 2018-05-19 NOTE — Telephone Encounter (Signed)
Dr. Bettina Gavia do you want to refill this patient's Gabapentin or have his PCP take over on it?

## 2018-05-19 NOTE — Telephone Encounter (Signed)
Left message for patient to return call to office. Will continue efforts.

## 2018-05-19 NOTE — Telephone Encounter (Signed)
No

## 2018-05-20 NOTE — Telephone Encounter (Signed)
Patient informed that he needs to get refills for gabapentin from his PCP. Patient verbalized understanding. No further questions.

## 2018-05-23 ENCOUNTER — Ambulatory Visit: Payer: 59

## 2018-05-23 VITALS — BP 128/78 | HR 78

## 2018-05-23 DIAGNOSIS — M6281 Muscle weakness (generalized): Secondary | ICD-10-CM

## 2018-05-23 DIAGNOSIS — R262 Difficulty in walking, not elsewhere classified: Secondary | ICD-10-CM | POA: Diagnosis not present

## 2018-05-23 DIAGNOSIS — R29898 Other symptoms and signs involving the musculoskeletal system: Secondary | ICD-10-CM

## 2018-05-23 DIAGNOSIS — M5442 Lumbago with sciatica, left side: Principal | ICD-10-CM

## 2018-05-23 DIAGNOSIS — G8929 Other chronic pain: Secondary | ICD-10-CM | POA: Diagnosis not present

## 2018-05-23 NOTE — Therapy (Signed)
[facility] [address]  [place], [place], [address] Phone: [phone]   Fax:  [phone]  Physical Therapy Treatment  Patient Details  Name: Reginald Rios MRN: [id number] Date of Birth: 05-31-1955 Referring Provider (PT): Karlton Lemon, MD   Encounter Date: 05/23/2018  PT End of Session - 05/23/18 1319    Visit Number  2    Number of Visits  17    Date for PT Re-Evaluation  07/13/18    Authorization Type  [facility]    PT Start Time  1315    PT Stop Time  1410    PT Time Calculation (min)  55 min    Activity Tolerance  Patient tolerated treatment well;Patient limited by pain    Behavior During Therapy  [facility] for tasks assessed/performed       Past Medical History:  Diagnosis Date  . ADD (attention deficit disorder)   . Anemia   . Arthritis   . CAD (coronary artery disease)    2/19 PCI/DESx1 to Lcx  . Diabetes mellitus without complication ([place])   . Exercise-induced asthma   . Heart murmur   . Hyperlipidemia    family hx of high cholesterol  . Hypogonadism in male   . Low back pain   . Osteoarthritis     Past Surgical History:  Procedure Laterality Date  . CORONARY STENT INTERVENTION N/A 06/23/2017   Procedure: CORONARY STENT INTERVENTION;  Surgeon: Leonie Man, MD;  Location: [place] [facility];  Service: Cardiovascular;  Laterality: N/A;  . INTRAVASCULAR PRESSURE WIRE/FFR STUDY N/A 06/23/2017   Procedure: INTRAVASCULAR PRESSURE WIRE/FFR STUDY;  Surgeon: Leonie Man, MD;  Location: [place] [facility];  Service: Cardiovascular;  Laterality: N/A;  . KNEE ARTHROSCOPY    . LEFT HEART CATH AND CORONARY ANGIOGRAPHY N/A 06/23/2017   Procedure: LEFT HEART CATH AND CORONARY ANGIOGRAPHY;  Surgeon: Leonie Man, MD;  Location: [place] [facility];  Service: Cardiovascular;  Laterality: N/A;  . TENOTOMY ACHILLES TENDON      Vitals:   05/23/18 1318  BP: 128/78  Pulse: 78  SpO2: 97%     Subjective Assessment - 05/23/18 1323    Subjective  Pt. reporting difficulty with LTR HEP.      Pertinent History  OA, LBP, HLD, heart murmur, asthma, DM, CAD, anemia, ADD, achilles tenotomy, L heart cath & angio 06/2017, coronary stent 06/2017    Patient Stated Goals  getting pain levels down    Currently in Pain?  Yes    Pain Score  4     Pain Location  Back    Pain Orientation  Left;Lower    Pain Descriptors / Indicators  Dull    Pain Type  Chronic pain    Pain Radiating Towards  B LE numbness     Pain Onset  More than a month ago    Pain Frequency  Constant    Aggravating Factors   Bending, putting socks on     Pain Relieving Factors  nothing     Multiple Pain Sites  No                       [facility] Adult PT Treatment/Exercise - 05/23/18 1336      Lumbar Exercises: Stretches   Passive Hamstring Stretch  Right;Left;1 rep;60 seconds    Passive Hamstring Stretch Limitations  strap     Lower Trunk Rotation  --  5" x 10 reps   Lower Trunk Rotation Limitations  Cues required for proper motion     Lumbar Stabilization Level 1  3 reps;20 seconds    Lumbar Stabilization Level 1 Limitations  green p-ball     Piriformis Stretch  Right;Left;1 rep;30 seconds    Piriformis Stretch Limitations  KTOS      Lumbar Exercises: Aerobic   Recumbent Bike  Lvl 1, 7 min       Lumbar Exercises: Supine   Bridge  10 reps;3 seconds    Bridge Limitations  good tolerance       Lumbar Exercises: Sidelying   Other Sidelying Lumbar Exercises  B "open book" stretch 5" x 10 reps       Moist Heat Therapy   Number Minutes Moist Heat  10 Minutes    Moist Heat Location  Lumbar Spine      Electrical Stimulation   Electrical Stimulation Location  lumbar spine     Electrical Stimulation Action  IFC    Electrical Stimulation Parameters  to tolerance, 10'    Electrical Stimulation Goals  Pain               PT Short Term Goals - 05/23/18 1319      PT SHORT TERM GOAL #1    Title  Patient to be independent with initial HEP.    Time  4    Period  Weeks    Status  On-going        PT Long Term Goals - 05/23/18 1319      PT LONG TERM GOAL #1   Title  Patient to be independent with advanced HEP.    Time  8    Period  Weeks    Status  On-going      PT LONG TERM GOAL #2   Title  Patient to demonstrated [facility] and pain-free lumbar AROM.    Time  8    Period  Weeks    Status  On-going      PT LONG TERM GOAL #3   Title  Patient to demonstrate >=4+/5 strength in B LEs.    Time  8    Period  Weeks    Status  On-going      PT LONG TERM GOAL #4   Title  Patient to report tolerance for 2 hours of sitting without pain limiting.     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     Time  8    Period  Weeks    Status  On-going            Plan - 05/23/18 1319    Clinical Impression Statement  Reginald Rios reporting centralization of his symptoms/overall reduction in LE pain since last visit.  Tolerated session well today focused on lumbar ROM and posterior proximal hip strengthening.  Did end visit with mild increase in LBP thus applied moist heat/E-stim with good reduction in pain.  Will plan to update HEP per pt. in coming visits.      Rehab Potential  Good    PT Frequency  2x / week    PT Duration  8 weeks    PT Treatment/Interventions  ADLs/Self Care Home Management;Cryotherapy;Electrical Stimulation;Functional mobility training;Stair training;Gait training;DME Instruction;Ultrasound;Traction;Moist Heat;Therapeutic activities;Therapeutic exercise;Balance training;Neuromuscular re-education;Patient/family education;Orthotic  Fit/Training;Passive range of motion;Manual techniques;Dry needling;Energy conservation;Splinting;Taping    PT Next Visit Plan  monitor tolerance to therex progression and E-stim/moist heat       Patient will benefit from  skilled therapeutic intervention in order to improve the following deficits and impairments:  Decreased range of motion, Difficulty walking, Increased muscle spasms, Decreased activity tolerance, Pain, Improper body mechanics, Impaired flexibility, Hypomobility, Decreased balance, Decreased strength, Postural dysfunction  Visit Diagnosis: Chronic bilateral low back pain with left-sided sciatica  Other symptoms and signs involving the musculoskeletal system  Difficulty in walking, not elsewhere classified  Muscle weakness (generalized)     Problem List Patient Active Problem List   Diagnosis Date Noted  . Palpitations 07/06/2017  . CAD (coronary artery disease) 07/05/2017  . Family history of early CAD 07/05/2017  . Abnormal findings on diagnostic imaging of cardiovascular system 06/23/2017  . Low vitamin D level 04/22/2017  . Pain in right ankle and joints of right foot 10/05/2016  . Hemarthrosis of right elbow 02/03/2016  . Small thenar eminence 02/03/2016  . Cervical disc disorder with radiculopathy of cervical region 10/07/2015  . Incomplete rotator cuff tear 09/10/2015  . Nonischemic cardiomyopathy ([place]) 05/15/2015  . Fatigue 05/15/2015  . Dyspnea on exertion 05/15/2015  . Decreased cardiac ejection fraction 04/02/2015  . Strain of latissimus dorsi muscle 01/21/2015  . Shoulder bursitis 10/12/2014  . Stenosis of lateral recess of lumbar spine 10/12/2014  . BPPV (benign paroxysmal positional vertigo) 08/14/2014  . Hyperlipidemia 01/19/2014  . Anemia, unspecified 01/19/2014  . ADD (attention deficit disorder) 01/19/2014  . Type 2 diabetes mellitus ([place]) 12/22/2013  . Hypogonadism in male 12/22/2013  . Arthritis of hand, left 12/22/2013    Bess Harvest, PTA 05/23/18 3:15 PM   [facility] [address]  [place], [place], [address] Phone: [phone]   Fax:  [phone]  Name: Reginald Rios MRN:  [id number] Date of Birth: [birth year]

## 2018-05-25 ENCOUNTER — Ambulatory Visit: Payer: 59

## 2018-05-25 ENCOUNTER — Ambulatory Visit: Payer: 59 | Admitting: Family Medicine

## 2018-05-25 ENCOUNTER — Encounter: Payer: Self-pay | Admitting: Family Medicine

## 2018-05-25 ENCOUNTER — Other Ambulatory Visit: Payer: Self-pay | Admitting: Cardiology

## 2018-05-25 VITALS — BP 104/62 | HR 56 | Temp 98.0°F | Ht 70.0 in | Wt 192.2 lb

## 2018-05-25 DIAGNOSIS — M5442 Lumbago with sciatica, left side: Secondary | ICD-10-CM | POA: Diagnosis not present

## 2018-05-25 DIAGNOSIS — B351 Tinea unguium: Secondary | ICD-10-CM | POA: Diagnosis not present

## 2018-05-25 DIAGNOSIS — M6281 Muscle weakness (generalized): Secondary | ICD-10-CM | POA: Diagnosis not present

## 2018-05-25 DIAGNOSIS — G8929 Other chronic pain: Secondary | ICD-10-CM | POA: Diagnosis not present

## 2018-05-25 DIAGNOSIS — R262 Difficulty in walking, not elsewhere classified: Secondary | ICD-10-CM | POA: Diagnosis not present

## 2018-05-25 DIAGNOSIS — E1142 Type 2 diabetes mellitus with diabetic polyneuropathy: Secondary | ICD-10-CM

## 2018-05-25 DIAGNOSIS — Z23 Encounter for immunization: Secondary | ICD-10-CM

## 2018-05-25 DIAGNOSIS — R29898 Other symptoms and signs involving the musculoskeletal system: Secondary | ICD-10-CM | POA: Diagnosis not present

## 2018-05-25 DIAGNOSIS — Z794 Long term (current) use of insulin: Secondary | ICD-10-CM

## 2018-05-25 DIAGNOSIS — F988 Other specified behavioral and emotional disorders with onset usually occurring in childhood and adolescence: Secondary | ICD-10-CM | POA: Diagnosis not present

## 2018-05-25 HISTORY — DX: Tinea unguium: B35.1

## 2018-05-25 LAB — MICROALBUMIN / CREATININE URINE RATIO
Creatinine,U: 132 mg/dL
Microalb Creat Ratio: 0.8 mg/g (ref 0.0–30.0)
Microalb, Ur: 1.1 mg/dL (ref 0.0–1.9)

## 2018-05-25 LAB — COMPREHENSIVE METABOLIC PANEL
ALT: 28 U/L (ref 0–53)
AST: 17 U/L (ref 0–37)
Albumin: 4.4 g/dL (ref 3.5–5.2)
Alkaline Phosphatase: 63 U/L (ref 39–117)
BUN: 15 mg/dL (ref 6–23)
CO2: 29 mEq/L (ref 19–32)
Calcium: 9.7 mg/dL (ref 8.4–10.5)
Chloride: 104 mEq/L (ref 96–112)
Creatinine, Ser: 0.87 mg/dL (ref 0.40–1.50)
GFR: 88.06 mL/min (ref >60.00)
Glucose, Bld: 158 mg/dL — ABNORMAL HIGH (ref 70–99)
Potassium: 5 mEq/L (ref 3.5–5.1)
Sodium: 139 mEq/L (ref 135–145)
Total Bilirubin: 0.6 mg/dL (ref 0.2–1.2)
Total Protein: 7.1 g/dL (ref 6.0–8.3)

## 2018-05-25 LAB — LIPID PANEL
Cholesterol: 135 mg/dL (ref 0–200)
HDL: 45.9 mg/dL (ref >39.00)
LDL Cholesterol: 73 mg/dL (ref 0–99)
NonHDL: 88.96
Total CHOL/HDL Ratio: 3
Triglycerides: 78 mg/dL (ref 0.0–149.0)
VLDL: 15.6 mg/dL (ref 0.0–40.0)

## 2018-05-25 LAB — CBC
HCT: 40.2 % (ref 39.0–52.0)
Hemoglobin: 13.6 g/dL (ref 13.0–17.0)
MCHC: 33.8 g/dL (ref 30.0–36.0)
MCV: 96.8 fl (ref 78.0–100.0)
Platelets: 220 10*3/uL (ref 150.0–400.0)
RBC: 4.16 Mil/uL — ABNORMAL LOW (ref 4.22–5.81)
RDW: 14.5 % (ref 11.5–15.5)
WBC: 5.5 10*3/uL (ref 4.0–10.5)

## 2018-05-25 LAB — HEMOGLOBIN A1C: Hgb A1c MFr Bld: 8.2 % — ABNORMAL HIGH (ref 4.6–6.5)

## 2018-05-25 MED ORDER — GABAPENTIN 100 MG PO CAPS: 100.0000 mg | ORAL_CAPSULE | Freq: Every day | ORAL | 3 refills | Status: DC

## 2018-05-25 MED FILL — GABAPENTIN 100 MG CAPSULE: 100 | 90 days supply | Qty: 90 | Fill #0

## 2018-05-25 MED FILL — LOSARTAN POTASSIUM 25 MG TA: 25 | 90 days supply | Qty: 90 | Fill #2

## 2018-05-25 NOTE — Progress Notes (Signed)
Pre visit review using our clinic review tool, if applicable. No additional management support is needed unless otherwise documented below in the visit note. 

## 2018-05-25 NOTE — Therapy (Signed)
[facility] [address]  [place], [place], [address] Phone: [phone]   Fax:  [phone]  Physical Therapy Treatment  Patient Details  Name: Reginald Rios MRN: [id number] Date of Birth: 1955-09-18 Referring Provider (PT): Karlton Lemon, MD   Encounter Date: 05/25/2018  PT End of Session - 05/25/18 0941    Visit Number  3    Number of Visits  17    Date for PT Re-Evaluation  07/13/18    Authorization Type  [facility]    PT Start Time  0935    PT Stop Time  1028    PT Time Calculation (min)  53 min    Activity Tolerance  Patient tolerated treatment well    Behavior During Therapy  [facility] for tasks assessed/performed       Past Medical History:  Diagnosis Date  . ADD (attention deficit disorder)   . Anemia   . Arthritis   . CAD (coronary artery disease)    2/19 PCI/DESx1 to Lcx  . Diabetes mellitus without complication ([place])   . Exercise-induced asthma   . Heart murmur   . Hyperlipidemia    family hx of high cholesterol  . Hypogonadism in male   . Low back pain   . Osteoarthritis     Past Surgical History:  Procedure Laterality Date  . CORONARY STENT INTERVENTION N/A 06/23/2017   Procedure: CORONARY STENT INTERVENTION;  Surgeon: Leonie Man, MD;  Location: [facility];  Service: Cardiovascular;  Laterality: N/A;  . INTRAVASCULAR PRESSURE WIRE/FFR STUDY N/A 06/23/2017   Procedure: INTRAVASCULAR PRESSURE WIRE/FFR STUDY;  Surgeon: Leonie Man, MD;  Location: [place] [facility];  Service: Cardiovascular;  Laterality: N/A;  . KNEE ARTHROSCOPY    . LEFT HEART CATH AND CORONARY ANGIOGRAPHY N/A 06/23/2017   Procedure: LEFT HEART CATH AND CORONARY ANGIOGRAPHY;  Surgeon: Leonie Man, MD;  Location: [place] [facility];  Service: Cardiovascular;  Laterality: N/A;  . TENOTOMY ACHILLES TENDON      There were no vitals filed for this visit.  Subjective Assessment - 05/25/18 0938    Subjective   Pt. reporting centralization of symptoms to pain in central lower back today.      Pertinent History  OA, LBP, HLD, heart murmur, asthma, DM, CAD, anemia, ADD, achilles tenotomy, L heart cath & angio 06/2017, coronary stent 06/2017    Patient Stated Goals  getting pain levels down    Currently in Pain?  Yes    Pain Score  6     Pain Location  Back    Pain Orientation  Lower;Medial    Pain Descriptors / Indicators  Sharp    Pain Type  Chronic pain    Pain Radiating Towards  Denies today    Pain Onset  More than a month ago    Pain Frequency  Constant    Aggravating Factors   Bending to cut toe nails     Multiple Pain Sites  No                       [facility] Adult PT Treatment/Exercise - 05/25/18 0949      Lumbar Exercises: Aerobic   Nustep  Lvl 3, 7 min (UE/LE)      Lumbar Exercises: Standing   Row  Both;10 reps;Theraband    Theraband Level (Row)  Level 3 (Green)    Row Limitations  Cues for full scapular retraction  required     Shoulder Extension  Both;10 reps;Theraband    Theraband Level (Shoulder Extension)  Level 2 (Red)    Shoulder Extension Limitations  scapular retraction cues required     Other Standing Lumbar Exercises  Standing lumbar extension 3" x 10 reps; cues required to limit ROM and painfree ROM      Lumbar Exercises: Supine   Clam  10 reps;3 seconds    Clam Limitations  + abdom. bracing with red looped TB around knees     Bent Knee Raise  15 reps;3 seconds    Bent Knee Raise Limitations  + abdom. bracing with red TB at knees     Bridge  15 reps;3 seconds    Bridge Limitations  good tolerance       Lumbar Exercises: Sidelying   Other Sidelying Lumbar Exercises  B "open book" stretch 5" x 10 reps       Moist Heat Therapy   Number Minutes Moist Heat  15 Minutes    Moist Heat Location  Lumbar Spine      Electrical Stimulation   Electrical Stimulation Location  lumbar spine     Electrical Stimulation Action  IFC    Electrical Stimulation  Parameters  to tolerance, 15    Electrical Stimulation Goals  Pain               PT Short Term Goals - 05/23/18 1319      PT SHORT TERM GOAL #1   Title  Patient to be independent with initial HEP.    Time  4    Period  Weeks    Status  On-going        PT Long Term Goals - 05/23/18 1319      PT LONG TERM GOAL #1   Title  Patient to be independent with advanced HEP.    Time  8    Period  Weeks    Status  On-going      PT LONG TERM GOAL #2   Title  Patient to demonstrated [facility] and pain-free lumbar AROM.    Time  8    Period  Weeks    Status  On-going      PT LONG TERM GOAL #3   Title  Patient to demonstrate >=4+/5 strength in B LEs.    Time  8    Period  Weeks    Status  On-going      PT LONG TERM GOAL #4   Title  Patient to report tolerance for 2 hours of sitting without pain limiting.     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     Time  8    Period  Weeks    Status  On-going            Plan - 05/25/18 0942    Clinical Impression Statement  Reginald Rios denies LE pain today and reports centralization of pain to central lower back.  Reports he has not been having radiating LE pain for a few days now.  Tolerated all lumbopelvic strengthening activities in session well today.  Will plan to begin progressing to more standing LE strengthening as pt. is able in coming visits.      Rehab Potential  Good    PT Frequency  2x / week    PT Duration  8 weeks    PT Treatment/Interventions  ADLs/Self Care Home Management;Cryotherapy;Electrical Stimulation;Functional mobility training;Stair training;Gait training;DME Instruction;Ultrasound;Traction;Moist Heat;Therapeutic activities;Therapeutic exercise;Balance training;Neuromuscular re-education;Patient/family education;Orthotic Fit/Training;Passive range of motion;Manual techniques;Dry needling;Energy  conservation;Splinting;Taping    PT Next Visit Plan  Progress lumbar ROM and lumbopelvic strengthening per pt.;     Consulted and Agree with Plan of Care  Patient       Patient will benefit from skilled therapeutic intervention in order to improve the following deficits and impairments:  Decreased range of motion, Difficulty walking, Increased muscle spasms, Decreased activity tolerance, Pain, Improper body mechanics, Impaired flexibility, Hypomobility, Decreased balance, Decreased strength, Postural dysfunction  Visit Diagnosis: Chronic bilateral low back pain with left-sided sciatica  Other symptoms and signs involving the musculoskeletal system  Difficulty in walking, not elsewhere classified  Muscle weakness (generalized)     Problem List Patient Active Problem List   Diagnosis Date Noted  . Onychomycosis 05/25/2018  . Palpitations 07/06/2017  . CAD (coronary artery disease) 07/05/2017  . Family history of early CAD 07/05/2017  . Abnormal findings on diagnostic imaging of cardiovascular system 06/23/2017  . Low vitamin D level 04/22/2017  . Pain in right ankle and joints of right foot 10/05/2016  . Hemarthrosis of right elbow 02/03/2016  . Small thenar eminence 02/03/2016  . Cervical disc disorder with radiculopathy of cervical region 10/07/2015  . Incomplete rotator cuff tear 09/10/2015  . Nonischemic cardiomyopathy ([place]) 05/15/2015  . Fatigue 05/15/2015  . Dyspnea on exertion 05/15/2015  . Decreased cardiac ejection fraction 04/02/2015  . Strain of latissimus dorsi muscle 01/21/2015  . Shoulder bursitis 10/12/2014  . Stenosis of lateral recess of lumbar spine 10/12/2014  . BPPV (benign paroxysmal positional vertigo) 08/14/2014  . Hyperlipidemia 01/19/2014  . Anemia, unspecified 01/19/2014  . ADD (attention deficit disorder) 01/19/2014  . Type 2 diabetes mellitus ([place]) 12/22/2013  . Hypogonadism in male 12/22/2013  . Arthritis of hand, left 12/22/2013    Bess Harvest, PTA 05/25/18 2:39 PM   [facility] [address]   [place], [place], [address] Phone: [phone]   Fax:  [phone]  Name: Reginald Rios MRN: [id number] Date of Birth: 1956/01/29

## 2018-05-25 NOTE — Patient Instructions (Addendum)
Give [place] 2-3 business days to get the results of your labs back.   Keep the diet clean and stay active.  Check your feet daily or every other day.  Let us know if you need anything.

## 2018-05-25 NOTE — Addendum Note (Signed)
Addended by: Sharon Seller B on: 05/25/2018 11:20 AM   Modules accepted: Orders

## 2018-05-25 NOTE — Progress Notes (Signed)
Chief Complaint  Patient presents with  . New Patient (Initial Visit)       New Patient Visit SUBJECTIVE: HPI: Reginald Rios is an 63 y.o.male who is being seen for establishing care.  DM 2- hx of DM. Plays tennis daily. Diet could be better. 4 mo ago eye exam.  He is currently on Janumet 50-1000 mg twice daily.  Reports compliance.  He does have neuropathy in both of his feet.  No pain but does have decreased sensation.  He does not check his feet routinely.  He does note he has thickened nails on his toes.  They started to bend as well.  He has never had a pneumonia shot, he did get a flu shot this season.   Allergies  Allergen Reactions  . Invokana [Canagliflozin] Rash  . Statins Rash and Other (See Comments)    Muscle aches   . Lisinopril Cough    Past Medical History:  Diagnosis Date  . ADD (attention deficit disorder)   . Anemia   . Arthritis   . CAD (coronary artery disease)    2/19 PCI/DESx1 to Lcx  . Diabetes mellitus without complication ([place])   . Exercise-induced asthma   . Heart murmur   . Hyperlipidemia    family hx of high cholesterol  . Hypogonadism in male   . Low back pain   . Osteoarthritis    Past Surgical History:  Procedure Laterality Date  . CORONARY STENT INTERVENTION N/A 06/23/2017   Procedure: CORONARY STENT INTERVENTION;  Surgeon: Leonie Man, MD;  Location: [place];  Service: Cardiovascular;  Laterality: N/A;  . INTRAVASCULAR PRESSURE WIRE/FFR STUDY N/A 06/23/2017   Procedure: INTRAVASCULAR PRESSURE WIRE/FFR STUDY;  Surgeon: Leonie Man, MD;  Location: [facility];  Service: Cardiovascular;  Laterality: N/A;  . KNEE ARTHROSCOPY    . LEFT HEART CATH AND CORONARY ANGIOGRAPHY N/A 06/23/2017   Procedure: LEFT HEART CATH AND CORONARY ANGIOGRAPHY;  Surgeon: Leonie Man, MD;  Location: [facility];  Service: Cardiovascular;  Laterality: N/A;  . TENOTOMY ACHILLES TENDON     Family History  Problem Relation Age  of Onset  . Lung cancer Mother 92  . Cancer Mother        lung cancer  . Coronary artery disease Father 47  . Heart disease Father   . Hypothyroidism Brother   . Colon cancer Neg Hx   . Prostate cancer Neg Hx    Allergies  Allergen Reactions  . Invokana [Canagliflozin] Rash  . Statins Rash and Other (See Comments)    Muscle aches   . Lisinopril Cough    Current Outpatient Medications:  .  ADVAIR HFA 115-21 MCG/ACT inhaler, Inhale 2 puffs into the lungs 2 (two) times daily as needed. , Disp: , Rfl: 3 .  aspirin EC 81 MG tablet, Take 81 mg by mouth daily., Disp: , Rfl:  .  clopidogrel (PLAVIX) 75 MG tablet, TAKE 1 TABLET (75 MG TOTAL) BY MOUTH DAILY WITH BREAKFAST., Disp: 90 tablet, Rfl: 2 .  diclofenac sodium (VOLTAREN) 1 % GEL, Apply 4 g topically 4 (four) times daily. (Patient taking differently: Apply 4 g topically 4 (four) times daily as needed. ), Disp: 100 g, Rfl: 2 .  gabapentin (NEURONTIN) 100 MG capsule, Take 1 capsule (100 mg total) by mouth at bedtime., Disp: 90 capsule, Rfl: 3 .  JANUMET 50-1000 MG per tablet, TAKE 1 TABLET BY MOUTH TWICE DAILY WITH A MEAL, Disp: 180  tablet, Rfl: 1 .  losartan (COZAAR) 25 MG tablet, Take 1 tablet (25 mg total) by mouth daily., Disp: 30 tablet, Rfl: 8 .  methylphenidate (RITALIN) 20 MG tablet, Take 20 mg by mouth daily., Disp: , Rfl:  .  montelukast (SINGULAIR) 10 MG tablet, Take 10 mg by mouth daily., Disp: , Rfl: 0 .  nateglinide (STARLIX) 120 MG tablet, Take 1 tablet (120 mg total) by mouth daily. (Patient taking differently: Take 120 mg by mouth daily as needed (for blood sugar greater than 150). ), Disp: 90 tablet, Rfl: 1 .  nitroGLYCERIN (NITROSTAT) 0.4 MG SL tablet, Place 1 tablet (0.4 mg total) under the tongue every 5 (five) minutes as needed., Disp: 25 tablet, Rfl: 2 .  pravastatin (PRAVACHOL) 20 MG tablet, Take 20 mg by mouth daily., Disp: , Rfl:  .  sildenafil (VIAGRA) 50 MG tablet, Take 1 tablet (50 mg total) by mouth daily as  needed for erectile dysfunction., Disp: 5 tablet, Rfl: 10 .  Testosterone 25 MG/2.5GM (1%) GEL, Place onto the skin., Disp: , Rfl:  .  VENTOLIN HFA 108 (90 Base) MCG/ACT inhaler, Inhale 1 puff into the lungs every 6 (six) hours as needed (cough)., Disp: 2 each, Rfl: 1 .  ezetimibe (ZETIA) 10 MG tablet, Take 1 tablet (10 mg total) by mouth daily., Disp: 30 tablet, Rfl: 3   ROS Cardiovascular: Denies chest pain  Respiratory: Denies dyspnea   OBJECTIVE: BP 104/62 (BP Location: Left Arm, Patient Position: Sitting, Cuff Size: Normal)   Pulse (!) 56   Temp 98 F (36.7 C) (Oral)   Ht 5\' 10"  (1.778 m)   Wt 192 lb 4 oz (87.2 kg)   SpO2 98%   BMI 27.59 kg/m   Constitutional: -  VS reviewed -  Well developed, well nourished, appears stated age -  No apparent distress  Psychiatric: -  Oriented to person, place, and time -  Memory intact -  Affect and mood normal -  Fluent conversation, good eye contact -  Judgment and insight age appropriate  Eye: -  Conjunctivae clear, no discharge -  Pupils symmetric, round, reactive to light  ENMT: -  MMM    Pharynx moist, no exudate, no erythema  Neck: -  No gross swelling, no palpable masses -  Thyroid midline, not enlarged, mobile, no palpable masses  Cardiovascular: -  RRR -  No bruits -  No LE edema  Respiratory: -  Normal respiratory effort, no accessory muscle use, no retraction -  Breath sounds equal, no wheezes, no ronchi, no crackles  Neurological:  -Decreased sensation to pinprick over toes and distal plantar surface of foot bilaterally  Musculoskeletal: -  No clubbing, no cyanosis -  Gait normal  Skin: -  No significant lesion on inspection of feet -  Warm and dry to palpation   ASSESSMENT/PLAN: Type 2 diabetes mellitus with diabetic polyneuropathy, with long-term current use of insulin (HCC) - Plan: Microalbumin / creatinine urine ratio, Lipid panel, Hemoglobin A1c, CBC, Comprehensive metabolic panel, HM DIABETES FOOT EXAM  Need  for tetanus booster - Plan: Tdap vaccine greater than or equal to 7yo IM  Need for vaccination against Streptococcus pneumoniae - Plan: Pneumococcal polysaccharide vaccine 23-valent greater than or equal to 2yo subcutaneous/IM  Onychomycosis  Attention deficit disorder (ADD) without hyperactivity  Patient instructed to sign release of records form from his previous PCP. Update immunizations.  Check labs.  May consider starting SGLT-2 inhibitor. Following with Dr. Ellene Route for neck, Dr Barbaraann Barthel for  back. Patient should return in 3 mo for DM visit. The patient voiced understanding and agreement to the plan.   [place], DO 05/25/18  10:05 AM

## 2018-05-26 ENCOUNTER — Other Ambulatory Visit: Payer: Self-pay | Admitting: Family Medicine

## 2018-05-26 MED ORDER — PIOGLITAZONE HCL 30 MG PO TABS: 30.0000 mg | ORAL_TABLET | Freq: Every day | ORAL | 3 refills | Status: DC

## 2018-05-26 MED FILL — PIOGLITAZONE HCL 30 MG TAB: 30 | 30 days supply | Qty: 30 | Fill #0

## 2018-05-30 MED FILL — JANUMET 50-1,000 MG TABLET: 50-1000 | 30 days supply | Qty: 60 | Fill #0

## 2018-05-31 ENCOUNTER — Other Ambulatory Visit: Payer: Self-pay | Admitting: Cardiology

## 2018-05-31 MED FILL — EZETIMIBE 10 MG TABS: 10 | 30 days supply | Qty: 30 | Fill #0

## 2018-06-01 ENCOUNTER — Ambulatory Visit: Payer: 59

## 2018-06-01 DIAGNOSIS — M542 Cervicalgia: Secondary | ICD-10-CM | POA: Diagnosis not present

## 2018-06-01 DIAGNOSIS — R29898 Other symptoms and signs involving the musculoskeletal system: Secondary | ICD-10-CM | POA: Diagnosis not present

## 2018-06-01 DIAGNOSIS — M4317 Spondylolisthesis, lumbosacral region: Secondary | ICD-10-CM | POA: Diagnosis not present

## 2018-06-01 DIAGNOSIS — R262 Difficulty in walking, not elsewhere classified: Secondary | ICD-10-CM | POA: Diagnosis not present

## 2018-06-01 DIAGNOSIS — M6281 Muscle weakness (generalized): Secondary | ICD-10-CM

## 2018-06-01 DIAGNOSIS — R030 Elevated blood-pressure reading, without diagnosis of hypertension: Secondary | ICD-10-CM | POA: Diagnosis not present

## 2018-06-01 DIAGNOSIS — G8929 Other chronic pain: Secondary | ICD-10-CM

## 2018-06-01 DIAGNOSIS — Z6827 Body mass index (BMI) 27.0-27.9, adult: Secondary | ICD-10-CM | POA: Diagnosis not present

## 2018-06-01 DIAGNOSIS — R51 Headache: Secondary | ICD-10-CM | POA: Diagnosis not present

## 2018-06-01 DIAGNOSIS — M5442 Lumbago with sciatica, left side: Principal | ICD-10-CM

## 2018-06-01 DIAGNOSIS — R519 Headache, unspecified: Secondary | ICD-10-CM

## 2018-06-01 HISTORY — DX: Cervicalgia: M54.2

## 2018-06-01 HISTORY — DX: Headache, unspecified: R51.9

## 2018-06-01 NOTE — Therapy (Signed)
[facility] [address]  [place], [place], [address] Phone: [phone]   Fax:  [phone]  Physical Therapy Treatment  Patient Details  Name: Reginald Rios MRN: [id number] Date of Birth: 12-Oct-1955 Referring Provider (PT): Karlton Lemon, MD   Encounter Date: 06/01/2018  PT End of Session - 06/01/18 0853    Visit Number  4    Number of Visits  17    Date for PT Re-Evaluation  07/13/18    Authorization Type  [facility]    PT Start Time  0845    PT Stop Time  0942    PT Time Calculation (min)  57 min    Activity Tolerance  Patient tolerated treatment well    Behavior During Therapy  [facility] for tasks assessed/performed       Past Medical History:  Diagnosis Date  . ADD (attention deficit disorder)   . Anemia   . Arthritis   . CAD (coronary artery disease)    2/19 PCI/DESx1 to Lcx  . Diabetes mellitus without complication ([place])   . Exercise-induced asthma   . Heart murmur   . Hyperlipidemia    family hx of high cholesterol  . Hypogonadism in male   . Low back pain   . Osteoarthritis     Past Surgical History:  Procedure Laterality Date  . CORONARY STENT INTERVENTION N/A 06/23/2017   Procedure: CORONARY STENT INTERVENTION;  Surgeon: Leonie Man, MD;  Location: [place] [facility];  Service: Cardiovascular;  Laterality: N/A;  . INTRAVASCULAR PRESSURE WIRE/FFR STUDY N/A 06/23/2017   Procedure: INTRAVASCULAR PRESSURE WIRE/FFR STUDY;  Surgeon: Leonie Man, MD;  Location: [place] [facility];  Service: Cardiovascular;  Laterality: N/A;  . KNEE ARTHROSCOPY    . LEFT HEART CATH AND CORONARY ANGIOGRAPHY N/A 06/23/2017   Procedure: LEFT HEART CATH AND CORONARY ANGIOGRAPHY;  Surgeon: Leonie Man, MD;  Location: [facility];  Service: Cardiovascular;  Laterality: N/A;  . TENOTOMY ACHILLES TENDON      There were no vitals filed for this visit.  Subjective Assessment - 06/01/18 0849    Subjective   Pt. noting "flare-up" of back pain after "trying to lift heavy cart" at work.      Pertinent History  OA, LBP, HLD, heart murmur, asthma, DM, CAD, anemia, ADD, achilles tenotomy, L heart cath & angio 06/2017, coronary stent 06/2017    Patient Stated Goals  getting pain levels down    Currently in Pain?  Yes    Pain Score  8     Pain Location  Back    Pain Orientation  Left;Medial    Pain Descriptors / Indicators  Sharp    Pain Type  Chronic pain    Pain Radiating Towards  reports radiating down into L buttocks and L posterior thigh   reports reduction in these symptoms yesterday after KTOS and LTR stretches    Pain Onset  More than a month ago    Pain Frequency  Constant    Multiple Pain Sites  No                       [facility] Adult PT Treatment/Exercise - 06/01/18 0904      Self-Care   Self-Care  Other Self-Care Comments    Other Self-Care Comments   Discussed with pt. using tennis ball on wall for L superior glute as pt. tender in this area  Lumbar Exercises: Stretches   Single Knee to Chest Stretch  Right;Left;2 reps;30 seconds    Single Knee to Chest Stretch Limitations  KTOS     Lower Trunk Rotation  --   5" x 10 reps    Piriformis Stretch  Right;Left;3 reps;30 seconds    Piriformis Stretch Limitations  KTOS      Lumbar Exercises: Aerobic   Nustep  Lvl 3, 7 min (UE/LE)      Lumbar Exercises: Supine   Clam  3 seconds   x 12 reps    Bridge  15 reps;3 seconds    Bridge Limitations  good tolerance     Bridge with clamshell  10 reps;3 seconds    Bridge with [employer] Limitations  with hip isometric abd/ER into red TB at knees       Moist Heat Therapy   Number Minutes Moist Heat  15 Minutes    Moist Heat Location  Lumbar Spine      Electrical Stimulation   Electrical Stimulation Location  lumbar spine     Electrical Stimulation Action  IFC    Electrical Stimulation Parameters  to tolerance, 15'    Electrical Stimulation Goals  Pain      Manual  Therapy   Manual Therapy  Soft tissue mobilization    Manual therapy comments  prone with pillow under belly     Soft tissue mobilization  STM to L lumbar paraspinals and superior glute             PT Education - 06/01/18 1056    Education Details  HEP update     Person(s) Educated  Patient    Methods  Explanation;Demonstration;Verbal cues;Handout    Comprehension  Verbalized understanding;Returned demonstration;Verbal cues required;Need further instruction       PT Short Term Goals - 06/01/18 9163      PT SHORT TERM GOAL #1   Title  Patient to be independent with initial HEP.    Time  4    Period  Weeks    Status  Achieved        PT Long Term Goals - 06/01/18 8466      PT LONG TERM GOAL #1   Title  Patient to be independent with advanced HEP.    Time  8    Period  Weeks    Status  Partially Met      PT LONG TERM GOAL #2   Title  Patient to demonstrated [facility] and pain-free lumbar AROM.    Time  8    Period  Weeks    Status  Partially Met      PT LONG TERM GOAL #3   Title  Patient to demonstrate >=4+/5 strength in B LEs.    Time  8    Period  Weeks    Status  On-going   not addressed today due to "flare-up" of back pain following lifting heavy cart at work     PT [place] #4   Title  Patient to report tolerance for 2 hours of sitting without pain limiting.     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     Time  8    Period  Weeks  Status  On-going   pt. noting improvement in this area since starting therapy           Plan - 06/01/18 0856    Clinical Impression Statement  Reginald Rios reporting had had been feeling well since last visit and seeing progress with pain levels until "overdoing it yesterday".  Pt. reporting he tried to lift a heavy cart at work and felt sharp pain in  L lower back, L buttocks, and radiating pain down into L posterior thigh which he relieved somewhat with LE stretching yesterday.  Pt. reporting this pain feels more muscular.  Palpation revealed increased tone and tenderness in L lumbar paraspinals and L superior glute which was addressed with manual therapy with good relief.  Pt. instructed on tennis ball release to L glute on wall and verbalized understanding of this.  Performed gentle strengthening to proximal hip musculature and ended visit with E-stim/moist heat to glutes and lumbar spine for further relief from pain levels and reduction in tone.  Pt. 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  Will continue to progress toward advancement of standing strengthening and ROM activities per pt. in coming sessions.      Rehab Potential  Good    PT Frequency  2x / week    PT Duration  8 weeks    PT Treatment/Interventions  ADLs/Self Care Home Management;Cryotherapy;Electrical Stimulation;Functional mobility training;Stair training;Gait training;DME Instruction;Ultrasound;Traction;Moist Heat;Therapeutic activities;Therapeutic exercise;Balance training;Neuromuscular re-education;Patient/family education;Orthotic Fit/Training;Passive range of motion;Manual techniques;Dry needling;Energy conservation;Splinting;Taping    PT Next Visit Plan  Progress lumbar ROM and lumbopelvic strengthening per pt.;     Consulted and Agree with Plan of Care  Patient       Patient will benefit from skilled therapeutic intervention in order to improve the following deficits and impairments:  Decreased range of motion, Difficulty walking, Increased muscle spasms, Decreased activity tolerance, Pain, Improper body mechanics, Impaired flexibility, Hypomobility, Decreased balance, Decreased strength, Postural dysfunction  Visit Diagnosis: Chronic bilateral low back pain with left-sided sciatica  Other symptoms and signs involving the  musculoskeletal system  Difficulty in walking, not elsewhere classified  Muscle weakness (generalized)     Problem List Patient Active Problem List   Diagnosis Date Noted  . Onychomycosis 05/25/2018  . Palpitations 07/06/2017  . CAD (coronary artery disease) 07/05/2017  . Family history of early CAD 07/05/2017  . Abnormal findings on diagnostic imaging of cardiovascular system 06/23/2017  . Low vitamin D level 04/22/2017  . Pain in right ankle and joints of right foot 10/05/2016  . Hemarthrosis of right elbow 02/03/2016  . Small thenar eminence 02/03/2016  . Cervical disc disorder with radiculopathy of cervical region 10/07/2015  . Incomplete rotator cuff tear 09/10/2015  . Nonischemic cardiomyopathy ([place]) 05/15/2015  . Fatigue 05/15/2015  . Dyspnea on exertion 05/15/2015  . Decreased cardiac ejection fraction 04/02/2015  . Strain of latissimus dorsi muscle 01/21/2015  . Shoulder bursitis 10/12/2014  . Stenosis of lateral recess of lumbar spine 10/12/2014  . BPPV (benign paroxysmal positional vertigo) 08/14/2014  . Hyperlipidemia 01/19/2014  . Anemia, unspecified 01/19/2014  . ADD (attention deficit disorder) 01/19/2014  . Type 2 diabetes mellitus ([place]) 12/22/2013  . Hypogonadism in male 12/22/2013  . Arthritis of hand, left 12/22/2013    Bess Harvest, PTA 06/01/18 1:48 PM   [facility] [address]  [place], [place], [address] Phone: [phone]   Fax:  [phone]  Name: Reginald  EARL Rios MRN: [id number] Date of Birth: 1955/08/15

## 2018-06-07 ENCOUNTER — Encounter: Payer: Self-pay | Admitting: Physical Therapy

## 2018-06-07 ENCOUNTER — Ambulatory Visit: Payer: 59 | Attending: Family Medicine | Admitting: Physical Therapy

## 2018-06-07 DIAGNOSIS — M6281 Muscle weakness (generalized): Secondary | ICD-10-CM | POA: Diagnosis not present

## 2018-06-07 DIAGNOSIS — M542 Cervicalgia: Secondary | ICD-10-CM | POA: Diagnosis not present

## 2018-06-07 DIAGNOSIS — R29898 Other symptoms and signs involving the musculoskeletal system: Secondary | ICD-10-CM | POA: Diagnosis not present

## 2018-06-07 DIAGNOSIS — M5442 Lumbago with sciatica, left side: Secondary | ICD-10-CM | POA: Diagnosis not present

## 2018-06-07 DIAGNOSIS — G8929 Other chronic pain: Secondary | ICD-10-CM | POA: Diagnosis not present

## 2018-06-07 DIAGNOSIS — R262 Difficulty in walking, not elsewhere classified: Secondary | ICD-10-CM | POA: Diagnosis not present

## 2018-06-07 NOTE — Therapy (Addendum)
[facility] [address]  [place], [place], [address] Phone: [phone]   Fax:  [phone]  Physical Therapy Treatment  Patient Details  Name: Reginald Rios MRN: [id number] Date of Birth: 10-17-1955 Referring Provider (PT): Karlton Lemon, MD   Encounter Date: 06/07/2018  PT End of Session - 06/07/18 0929    Visit Number  5    Number of Visits  17    Date for PT Re-Evaluation  07/13/18    Authorization Type  [facility]    PT Start Time  0845    PT Stop Time  0935    PT Time Calculation (min)  50 min    Activity Tolerance  Patient tolerated treatment well;Patient limited by pain    Behavior During Therapy  [facility] for tasks assessed/performed       Past Medical History:  Diagnosis Date  . ADD (attention deficit disorder)   . Anemia   . Arthritis   . CAD (coronary artery disease)    2/19 PCI/DESx1 to Lcx  . Diabetes mellitus without complication ([place])   . Exercise-induced asthma   . Heart murmur   . Hyperlipidemia    family hx of high cholesterol  . Hypogonadism in male   . Low back pain   . Osteoarthritis     Past Surgical History:  Procedure Laterality Date  . CORONARY STENT INTERVENTION N/A 06/23/2017   Procedure: CORONARY STENT INTERVENTION;  Surgeon: Leonie Man, MD;  Location: [place] [facility];  Service: Cardiovascular;  Laterality: N/A;  . INTRAVASCULAR PRESSURE WIRE/FFR STUDY N/A 06/23/2017   Procedure: INTRAVASCULAR PRESSURE WIRE/FFR STUDY;  Surgeon: Leonie Man, MD;  Location: [place] [facility];  Service: Cardiovascular;  Laterality: N/A;  . KNEE ARTHROSCOPY    . LEFT HEART CATH AND CORONARY ANGIOGRAPHY N/A 06/23/2017   Procedure: LEFT HEART CATH AND CORONARY ANGIOGRAPHY;  Surgeon: Leonie Man, MD;  Location: [place] [facility];  Service: Cardiovascular;  Laterality: N/A;  . TENOTOMY ACHILLES TENDON      There were no vitals filed for this visit.  Subjective Assessment - 06/07/18  0846    Subjective  Reports he is in a lot of pain today- raked yesterday. Notes good benfit from piriformis stretch.     Pertinent History  OA, LBP, HLD, heart murmur, asthma, DM, CAD, anemia, ADD, achilles tenotomy, L heart cath & angio 06/2017, coronary stent 06/2017    Diagnostic tests  none    Patient Stated Goals  getting pain levels down    Currently in Pain?  Yes    Pain Score  8     Pain Location  Back    Pain Orientation  Left    Pain Descriptors / Indicators  Dull;Aching    Pain Radiating Towards  radiating down L LE                       [facility] Adult PT Treatment/Exercise - 06/07/18 0001      Self-Care   Self-Care  Lifting    Other Self-Care Comments   practice and edu on lifting 10# box from ground   5x lift from floor to stand- cues for upright trunk     Lumbar Exercises: Aerobic   Recumbent Bike  Lvl 2, 6 min       Lumbar Exercises: Sidelying   Other Sidelying Lumbar Exercises  B "open book" stretch 5" x 10 reps  cues to maintain LEs down     Lumbar Exercises: Prone   Other Prone Lumbar Exercises  prone on elbows 10x3"      Lumbar Exercises: Quadruped   Madcat/Old Horse  10 reps    Madcat/Old Horse Limitations  cues to maintain elbows straight   limited ROM   Other Quadruped Lumbar Exercises  child pose 10x5"   cues for form     Moist Heat Therapy   Number Minutes Moist Heat  10 Minutes    Moist Heat Location  Lumbar Spine      Electrical Stimulation   Electrical Stimulation Location  lumbar spine     Electrical Stimulation Action  IFC    Electrical Stimulation Parameters  80-_0 ; output 20 to tol; 10 min    Electrical Stimulation Goals  Pain      Manual Therapy   Manual Therapy  Soft tissue mobilization    Manual therapy comments  prone with pillow under belly     Soft tissue mobilization  STM to L lumbar paraspinals, superior glute, piriformis   increased tone to L LB compared to R; tightness in L pirifor            PT  Education - 06/07/18 0929    Education Details  update to HEP; edu on lifting mechanics    Person(s) Educated  Patient    Methods  Explanation;Demonstration;Tactile cues;Verbal cues;Handout    Comprehension  Verbalized understanding;Returned demonstration       PT Short Term Goals - 06/01/18 0907      PT SHORT TERM GOAL #1   Title  Patient to be independent with initial HEP.    Time  4    Period  Weeks    Status  Achieved        PT Long Term Goals - 06/01/18 1308      PT LONG TERM GOAL #1   Title  Patient to be independent with advanced HEP.    Time  8    Period  Weeks    Status  Partially Met      PT LONG TERM GOAL #2   Title  Patient to demonstrated [facility] and pain-free lumbar AROM.    Time  8    Period  Weeks    Status  Partially Met      PT LONG TERM GOAL #3   Title  Patient to demonstrate >=4+/5 strength in B LEs.    Time  8    Period  Weeks    Status  On-going   not addressed today due to "flare-up" of back pain following lifting heavy cart at work     PT [place] #4   Title  Patient to report tolerance for 2 hours of sitting without pain limiting.     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     Time  8    Period  Weeks    Status  On-going   pt. noting improvement in this area since starting therapy           Plan - 06/07/18 0930    Clinical Impression Statement  Patient arrived to session with report of flare up in L LBP with radiation of  pain down L LE from raking yesterday. Tolerated STM to L lumbar paraspinals, QL, superior glute, and piriformis- patient reported resolution of LE pain. Introduced prone on elbows with limited ROM but good tolerance. Patient continued to report relive with cat/cow and child pose stretching. Updated HEP to include these exercises. Patient  reported understanding. Educated patient on lifting mechanics with 10# box. Cues required for glute activation and fully upright stance. Patient with slight increase in LBP after this activity. Addressed pain with e-stim and moist heat to L LB for pain relief. Normal integumentary response observed and patient without complaints at end of session.     PT Treatment/Interventions  ADLs/Self Care Home Management;Cryotherapy;Electrical Stimulation;Functional mobility training;Stair training;Gait training;DME Instruction;Ultrasound;Traction;Moist Heat;Therapeutic activities;Therapeutic exercise;Balance training;Neuromuscular re-education;Patient/family education;Orthotic Fit/Training;Passive range of motion;Manual techniques;Dry needling;Energy conservation;Splinting;Taping    PT Next Visit Plan  Progress lumbar ROM and lumbopelvic strengthening per pt.;     Consulted and Agree with Plan of Care  Patient       Patient will benefit from skilled therapeutic intervention in order to improve the following deficits and impairments:  Decreased range of motion, Difficulty walking, Increased muscle spasms, Decreased activity tolerance, Pain, Improper body mechanics, Impaired flexibility, Hypomobility, Decreased balance, Decreased strength, Postural dysfunction  Visit Diagnosis: Chronic bilateral low back pain with left-sided sciatica  Other symptoms and signs involving the musculoskeletal system  Difficulty in walking, not elsewhere classified  Muscle weakness (generalized)     Problem List Patient Active Problem List   Diagnosis Date Noted  . Onychomycosis 05/25/2018  . Palpitations 07/06/2017  . CAD (coronary artery disease) 07/05/2017  . Family history of early CAD 07/05/2017  . Abnormal findings on diagnostic imaging of cardiovascular system 06/23/2017  . Low vitamin D level 04/22/2017  . Pain in right ankle and joints of right foot 10/05/2016  . Hemarthrosis of right elbow 02/03/2016  . Small  thenar eminence 02/03/2016  . Cervical disc disorder with radiculopathy of cervical region 10/07/2015  . Incomplete rotator cuff tear 09/10/2015  . Nonischemic cardiomyopathy ([place]) 05/15/2015  . Fatigue 05/15/2015  . Dyspnea on exertion 05/15/2015  . Decreased cardiac ejection fraction 04/02/2015  . Strain of latissimus dorsi muscle 01/21/2015  . Shoulder bursitis 10/12/2014  . Stenosis of lateral recess of lumbar spine 10/12/2014  . BPPV (benign paroxysmal positional vertigo) 08/14/2014  . Hyperlipidemia 01/19/2014  . Anemia, unspecified 01/19/2014  . ADD (attention deficit disorder) 01/19/2014  . Type 2 diabetes mellitus ([place]) 12/22/2013  . Hypogonadism in male 12/22/2013  . Arthritis of hand, left 12/22/2013     Janene Harvey, PT, DPT 06/07/18 10:23 AM    [facility] [address]  [place], [place], [address] Phone: [phone]   Fax:  [phone]  Name: Reginald Rios MRN: [id number] Date of Birth: 10/17/1955

## 2018-06-08 ENCOUNTER — Ambulatory Visit: Payer: 59

## 2018-06-08 DIAGNOSIS — M5442 Lumbago with sciatica, left side: Secondary | ICD-10-CM | POA: Diagnosis not present

## 2018-06-08 DIAGNOSIS — R262 Difficulty in walking, not elsewhere classified: Secondary | ICD-10-CM | POA: Diagnosis not present

## 2018-06-08 DIAGNOSIS — G8929 Other chronic pain: Secondary | ICD-10-CM

## 2018-06-08 DIAGNOSIS — M6281 Muscle weakness (generalized): Secondary | ICD-10-CM | POA: Diagnosis not present

## 2018-06-08 DIAGNOSIS — R29898 Other symptoms and signs involving the musculoskeletal system: Secondary | ICD-10-CM | POA: Diagnosis not present

## 2018-06-08 DIAGNOSIS — M542 Cervicalgia: Secondary | ICD-10-CM

## 2018-06-08 NOTE — Therapy (Signed)
[facility] [address]  [place], [place], [address] Phone: [phone]   Fax:  [phone]  Physical Therapy Treatment  Patient Details  Name: Reginald Rios MRN: [id number] Date of Birth: 1956/05/01 Referring Provider (PT): Karlton Lemon, MD   Encounter Date: 06/08/2018  PT End of Session - 06/08/18 0851    Visit Number  6    Number of Visits  17    Date for PT Re-Evaluation  07/13/18    Authorization Type  [facility]    PT Start Time  0847    PT Stop Time  0945    PT Time Calculation (min)  58 min    Activity Tolerance  Patient tolerated treatment well;Patient limited by pain    Behavior During Therapy  [facility] for tasks assessed/performed       Past Medical History:  Diagnosis Date  . ADD (attention deficit disorder)   . Anemia   . Arthritis   . CAD (coronary artery disease)    2/19 PCI/DESx1 to Lcx  . Diabetes mellitus without complication ([place])   . Exercise-induced asthma   . Heart murmur   . Hyperlipidemia    family hx of high cholesterol  . Hypogonadism in male   . Low back pain   . Osteoarthritis     Past Surgical History:  Procedure Laterality Date  . CORONARY STENT INTERVENTION N/A 06/23/2017   Procedure: CORONARY STENT INTERVENTION;  Surgeon: Leonie Man, MD;  Location: [place] [facility];  Service: Cardiovascular;  Laterality: N/A;  . INTRAVASCULAR PRESSURE WIRE/FFR STUDY N/A 06/23/2017   Procedure: INTRAVASCULAR PRESSURE WIRE/FFR STUDY;  Surgeon: Leonie Man, MD;  Location: [place] [facility];  Service: Cardiovascular;  Laterality: N/A;  . KNEE ARTHROSCOPY    . LEFT HEART CATH AND CORONARY ANGIOGRAPHY N/A 06/23/2017   Procedure: LEFT HEART CATH AND CORONARY ANGIOGRAPHY;  Surgeon: Leonie Man, MD;  Location: [place] [facility];  Service: Cardiovascular;  Laterality: N/A;  . TENOTOMY ACHILLES TENDON      There were no vitals filed for this visit.  Subjective Assessment - 06/08/18  0850    Subjective  Pt. reporting L LE radiating pain down     Pertinent History  OA, LBP, HLD, heart murmur, asthma, DM, CAD, anemia, ADD, achilles tenotomy, L heart cath & angio 06/2017, coronary stent 06/2017    Patient Stated Goals  getting pain levels down    Currently in Pain?  Yes    Pain Score  6     Pain Location  Back    Pain Orientation  Left    Pain Descriptors / Indicators  Dull;Aching    Pain Type  Chronic pain    Pain Radiating Towards  Radiating down posterior/lateral L LE     Pain Onset  More than a month ago    Pain Frequency  Constant    Multiple Pain Sites  No                       [facility] Adult PT Treatment/Exercise - 06/08/18 0905      Lumbar Exercises: Stretches   Lower Trunk Rotation  --   5" x 10 reps    Piriformis Stretch  Left;3 reps;30 seconds    Piriformis Stretch Limitations  supine KTOS,       Lumbar Exercises: Aerobic   Recumbent Bike  Lvl 2, 6 min       Lumbar  Exercises: Standing   Functional Squats  15 reps;3 seconds    Functional Squats Limitations  counter       Lumbar Exercises: Supine   Clam  15 reps;3 seconds    Clam Limitations  + abdom. bracing with red looped TB around knees     Bridge with clamshell  15 reps;3 seconds    Bridge with [employer] Limitations  with hip isometric abd/ER into red TB at knees       Lumbar Exercises: Quadruped   Madcat/Old Horse  10 reps    Madcat/Old Horse Limitations  Cues to avoid painful ROM into extension     Straight Leg Raise  10 reps;3 seconds    Other Quadruped Lumbar Exercises  childs pose x 20 sec       Moist Heat Therapy   Number Minutes Moist Heat  15 Minutes    Moist Heat Location  Lumbar Spine      Electrical Stimulation   Electrical Stimulation Location  lumbar spine     Electrical Stimulation Action  IFC    Electrical Stimulation Parameters  to tolerance, 15'    Electrical Stimulation Goals  Pain;Tone      Manual Therapy   Manual Therapy  Myofascial release;Soft  tissue mobilization    Manual therapy comments  sidelying with L LE elevated on bolster     Soft tissue mobilization  STM to L buttocks/piri in area of tenderness     Myofascial Release  TPR to L piriformis with palpable twitch response and pt. noting improveo comfort decreased tenderness             PT Education - 06/08/18 0944    Education Details  HEP update; red TB issued to pt.     Person(s) Educated  Patient    Methods  Explanation;Demonstration;Verbal cues;Handout    Comprehension  Verbalized understanding;Returned demonstration;Verbal cues required;Need further instruction       PT Short Term Goals - 06/01/18 9371      PT SHORT TERM GOAL #1   Title  Patient to be independent with initial HEP.    Time  4    Period  Weeks    Status  Achieved        PT Long Term Goals - 06/01/18 6967      PT LONG TERM GOAL #1   Title  Patient to be independent with advanced HEP.    Time  8    Period  Weeks    Status  Partially Met      PT LONG TERM GOAL #2   Title  Patient to demonstrated [facility] and pain-free lumbar AROM.    Time  8    Period  Weeks    Status  Partially Met      PT LONG TERM GOAL #3   Title  Patient to demonstrate >=4+/5 strength in B LEs.    Time  8    Period  Weeks    Status  On-going   not addressed today due to "flare-up" of back pain following lifting heavy cart at work     PT [place] #4   Title  Patient to report tolerance for 2 hours of sitting without pain limiting.     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     Time  8    Period  Weeks    Status  On-going   pt. noting improvement in this area since starting therapy           Plan - 06/08/18 6659    Clinical Impression Statement  Pt. seen ambulating into clinic for session today with forward flexed posture.   Jarmel reporting relief after last session however increased LBP today with some L LE radicular symptoms into L posterior/lateral thigh.  Manual therapy addressing tenderness/increased tension in L glute/piriformis musculature with palpable decrease in tone and report of reduced tenderness following TPR/DTM to L piriformis.  Tolerated all gentle proximal hip strengthening and ROM activities well today.  Ended visit with moist heat/E-stim to L buttocks with good reduction in pain and pt. leaving session ambulating with improved upright posture.      Rehab Potential  Good    PT Treatment/Interventions  ADLs/Self Care Home Management;Cryotherapy;Electrical Stimulation;Functional mobility training;Stair training;Gait training;DME Instruction;Ultrasound;Traction;Moist Heat;Therapeutic activities;Therapeutic exercise;Balance training;Neuromuscular re-education;Patient/family education;Orthotic Fit/Training;Passive range of motion;Manual techniques;Dry needling;Energy conservation;Splinting;Taping    PT Next Visit Plan  Progress lumbar ROM and lumbopelvic strengthening per pt    Consulted and Agree with Plan of Care  Patient       Patient will benefit from skilled therapeutic intervention in order to improve the following deficits and impairments:  Decreased range of motion, Difficulty walking, Increased muscle spasms, Decreased activity tolerance, Pain, Improper body mechanics, Impaired flexibility, Hypomobility, Decreased balance, Decreased strength, Postural dysfunction  Visit Diagnosis: Chronic bilateral low back pain with left-sided sciatica  Other symptoms and signs involving the musculoskeletal system  Difficulty in walking, not elsewhere classified  Muscle weakness (generalized)  Cervicalgia     Problem List Patient Active Problem List   Diagnosis Date Noted  . Onychomycosis 05/25/2018  . Palpitations 07/06/2017  . CAD (coronary artery disease) 07/05/2017  . Family history of early CAD  07/05/2017  . Abnormal findings on diagnostic imaging of cardiovascular system 06/23/2017  . Low vitamin D level 04/22/2017  . Pain in right ankle and joints of right foot 10/05/2016  . Hemarthrosis of right elbow 02/03/2016  . Small thenar eminence 02/03/2016  . Cervical disc disorder with radiculopathy of cervical region 10/07/2015  . Incomplete rotator cuff tear 09/10/2015  . Nonischemic cardiomyopathy ([place]) 05/15/2015  . Fatigue 05/15/2015  . Dyspnea on exertion 05/15/2015  . Decreased cardiac ejection fraction 04/02/2015  . Strain of latissimus dorsi muscle 01/21/2015  . Shoulder bursitis 10/12/2014  . Stenosis of lateral recess of lumbar spine 10/12/2014  . BPPV (benign paroxysmal positional vertigo) 08/14/2014  . Hyperlipidemia 01/19/2014  . Anemia, unspecified 01/19/2014  . ADD (attention deficit disorder) 01/19/2014  . Type 2 diabetes mellitus ([place]) 12/22/2013  . Hypogonadism in male 12/22/2013  . Arthritis of hand, left 12/22/2013    Bess Harvest, PTA 06/08/18 12:36 PM   [facility] [address]  [place], [place], [address] Phone: [phone]   Fax:  [phone]  Name: GARRIN KIRWAN MRN: [id number] Date of Birth: Jun 30, 1955

## 2018-06-15 ENCOUNTER — Ambulatory Visit: Payer: 59 | Admitting: Family Medicine

## 2018-06-15 ENCOUNTER — Encounter: Payer: Self-pay | Admitting: Family Medicine

## 2018-06-15 VITALS — BP 123/70 | HR 80 | Ht 70.0 in | Wt 192.0 lb

## 2018-06-15 DIAGNOSIS — M5416 Radiculopathy, lumbar region: Secondary | ICD-10-CM | POA: Diagnosis not present

## 2018-06-15 NOTE — Patient Instructions (Signed)
Call Dr. Ellene Route to ask about an epidural injection for your low back too - you might be able to do this at the same time as the one for your neck on the 18th. Continue your home exercises. Consider putting physical therapy on a 30 day hold but you can call any time in that window to restart. Follow up with me in 6 weeks.

## 2018-06-16 ENCOUNTER — Encounter: Payer: Self-pay | Admitting: Family Medicine

## 2018-06-16 NOTE — Progress Notes (Signed)
PCP and consultation requested by: [name], DO  Subjective:   HPI: Patient is a 63 y.o. male here for left leg pain.  1/8: Patient reports for several weeks (though worse past 2 weeks) he's had pain posterior left hip into left leg. Feels like a shooting pain, knife-like into his hip area. Associated pain anterior ankle, knee. Not seen any swelling but top of his foot has been red. Sees a chiropractor which has helped some. Taking norco as needed. Told he has bulging discs in his low back. Pain level 5/10. No other skin changes. No numbness, bowel/bladder dysfunction.  2/12: Patient reports his left foot/ankle are improved. His low back is still a problem with radiation into left hip and leg. Prednisone and physical therapy both helped some but the therapy benefit doesn't seem to last. Doing home exercises. Has seen Dr. Ellene Route and set up for an [facility] for cervical spine for the 18th. No bowel/bladder dysfunction.  Past Medical History:  Diagnosis Date  . ADD (attention deficit disorder)   . Anemia   . Arthritis   . CAD (coronary artery disease)    2/19 PCI/DESx1 to Lcx  . Diabetes mellitus without complication ([place])   . Exercise-induced asthma   . Heart murmur   . Hyperlipidemia    family hx of high cholesterol  . Hypogonadism in male   . Low back pain   . Osteoarthritis     Current Outpatient Medications on File Prior to Visit  Medication Sig Dispense Refill  . ADVAIR HFA 115-21 MCG/ACT inhaler Inhale 2 puffs into the lungs 2 (two) times daily as needed.   3  . aspirin EC 81 MG tablet Take 81 mg by mouth daily.    . clopidogrel (PLAVIX) 75 MG tablet TAKE 1 TABLET (75 MG TOTAL) BY MOUTH DAILY WITH BREAKFAST. 90 tablet 2  . diclofenac sodium (VOLTAREN) 1 % GEL Apply 4 g topically 4 (four) times daily. (Patient taking differently: Apply 4 g topically 4 (four) times daily as needed. ) 100 g 2  . ezetimibe (ZETIA) 10 MG tablet TAKE 1 TABLET (10 MG TOTAL) BY  MOUTH DAILY. 30 tablet 3  . gabapentin (NEURONTIN) 100 MG capsule Take 1 capsule (100 mg total) by mouth at bedtime. 90 capsule 3  . JANUMET 50-1000 MG per tablet TAKE 1 TABLET BY MOUTH TWICE DAILY WITH A MEAL 180 tablet 1  . losartan (COZAAR) 25 MG tablet Take 1 tablet (25 mg total) by mouth daily. 30 tablet 8  . methylphenidate (RITALIN) 20 MG tablet Take 20 mg by mouth daily.    . montelukast (SINGULAIR) 10 MG tablet Take 10 mg by mouth daily.  0  . nitroGLYCERIN (NITROSTAT) 0.4 MG SL tablet Place 1 tablet (0.4 mg total) under the tongue every 5 (five) minutes as needed. 25 tablet 2  . pioglitazone (ACTOS) 30 MG tablet Take 1 tablet (30 mg total) by mouth daily. 30 tablet 3  . pravastatin (PRAVACHOL) 20 MG tablet Take 20 mg by mouth daily.    . sildenafil (VIAGRA) 50 MG tablet Take 1 tablet (50 mg total) by mouth daily as needed for erectile dysfunction. 5 tablet 10  . testosterone (ANDROGEL) 50 MG/5GM (1%) GEL     . VENTOLIN HFA 108 (90 Base) MCG/ACT inhaler Inhale 1 puff into the lungs every 6 (six) hours as needed (cough). 2 each 1   No current facility-administered medications on file prior to visit.     Past Surgical History:  Procedure  Laterality Date  . CORONARY STENT INTERVENTION N/A 06/23/2017   Procedure: CORONARY STENT INTERVENTION;  Surgeon: Leonie Man, MD;  Location: [facility];  Service: Cardiovascular;  Laterality: N/A;  . INTRAVASCULAR PRESSURE WIRE/FFR STUDY N/A 06/23/2017   Procedure: INTRAVASCULAR PRESSURE WIRE/FFR STUDY;  Surgeon: Leonie Man, MD;  Location: [facility];  Service: Cardiovascular;  Laterality: N/A;  . KNEE ARTHROSCOPY    . LEFT HEART CATH AND CORONARY ANGIOGRAPHY N/A 06/23/2017   Procedure: LEFT HEART CATH AND CORONARY ANGIOGRAPHY;  Surgeon: Leonie Man, MD;  Location: [facility];  Service: Cardiovascular;  Laterality: N/A;  . TENOTOMY ACHILLES TENDON      Allergies  Allergen Reactions  . Invokana [Canagliflozin]  Rash  . Statins Rash and Other (See Comments)    Muscle aches   . Lisinopril Cough    Social History   Socioeconomic History  . Marital status: Married    Spouse name: Not on file  . Number of children: Not on file  . Years of education: Not on file  . Highest education level: Not on file  Occupational History  . Occupation: Air cabin crew   Social Needs  . Financial resource strain: Not on file  . Food insecurity:    Worry: Not on file    Inability: Not on file  . Transportation needs:    Medical: Not on file    Non-medical: Not on file  Tobacco Use  . Smoking status: Never Smoker  . Smokeless tobacco: Never Used  Substance and Sexual Activity  . Alcohol use: Yes    Alcohol/week: 0.0 standard drinks    Comment: rare  . Drug use: No  . Sexual activity: Not on file  Lifestyle  . Physical activity:    Days per week: Not on file    Minutes per session: Not on file  . Stress: Not on file  Relationships  . Social connections:    Talks on phone: Not on file    Gets together: Not on file    Attends religious service: Not on file    Active member of club or organization: Not on file    Attends meetings of clubs or organizations: Not on file    Relationship status: Not on file  . Intimate partner violence:    Fear of current or ex partner: Not on file    Emotionally abused: Not on file    Physically abused: Not on file    Forced sexual activity: Not on file  Other Topics Concern  . Not on file  Social History Narrative   Previously worked as Geophysicist/field seismologist   Currently working at [employer] clinic   Married 34 years   2 children ages 48, 68   Wife is psychiatrist - Dr. Margurite Auerbach   Moved from [place] up in [place]    Family History  Problem Relation Age of Onset  . Lung cancer Mother 31  . Cancer Mother        lung cancer  . Coronary artery disease Father 52  . Heart disease Father   . Hypothyroidism Brother   . Colon cancer Neg Hx   . Prostate  cancer Neg Hx     BP 123/70   Pulse 80   Ht 5\' 10"  (1.778 m)   Wt 192 lb (87.1 kg)   BMI 27.55 kg/m   Review of Systems: See HPI above.     Objective:  Physical Exam:  Gen: NAD, comfortable in exam room  Back: No gross deformity, scoliosis. TTP left paraspinal region.  No midline or bony TTP. ROM 10 degrees extension, 70 flexion. Strength LEs 5/5 all muscle groups except 4/5 left knee extension.   2+ MSRs in patellar and achilles tendons, equal bilaterally. Negative SLRs. Sensation intact to light touch bilaterally. Negative fabers and piriformis stretches.  Left hip: No deformity. FROM with 5/5 strength. No tenderness to palpation. NVI distally. Negative logroll.   Assessment & Plan:  1. Left leg pain - primarily due to radiculopathy.  Some improvement with prednisone and physical therapy.  He will speak to Dr. Ellene Route about trial of ESI for lumbar spine as well.  Continue home exercises, will put PT on hold for now.  F/u in 6 weeks.

## 2018-06-21 DIAGNOSIS — R51 Headache: Secondary | ICD-10-CM | POA: Diagnosis not present

## 2018-06-22 MED FILL — MONTELUKAST SOD 10 MG TAB: 10 | 90 days supply | Qty: 90 | Fill #0

## 2018-06-22 MED FILL — TESTOSTERONE 50 MG/5GM (1%): 50 MG/5GM | 45 days supply | Qty: 450 | Fill #2

## 2018-06-23 ENCOUNTER — Other Ambulatory Visit: Payer: Self-pay | Admitting: Family Medicine

## 2018-06-23 MED ORDER — METHYLPHENIDATE HCL 20 MG PO TABS: 20.0000 mg | ORAL_TABLET | Freq: Every day | ORAL | 0 refills | Status: DC

## 2018-06-23 MED FILL — METHYLPHENIDATE 20 MG TAB: 20 | 30 days supply | Qty: 30 | Fill #0

## 2018-07-04 MED FILL — EZETIMIBE 10 MG TABS: 10 | 30 days supply | Qty: 30 | Fill #1

## 2018-07-04 MED FILL — PIOGLITAZONE HCL 30 MG TAB: 30 | 30 days supply | Qty: 30 | Fill #1

## 2018-07-05 ENCOUNTER — Ambulatory Visit: Payer: 59 | Attending: Family Medicine | Admitting: Physical Therapy

## 2018-07-05 ENCOUNTER — Encounter: Payer: Self-pay | Admitting: Physical Therapy

## 2018-07-05 DIAGNOSIS — G8929 Other chronic pain: Secondary | ICD-10-CM | POA: Diagnosis not present

## 2018-07-05 DIAGNOSIS — M6281 Muscle weakness (generalized): Secondary | ICD-10-CM | POA: Insufficient documentation

## 2018-07-05 DIAGNOSIS — R29898 Other symptoms and signs involving the musculoskeletal system: Secondary | ICD-10-CM | POA: Diagnosis not present

## 2018-07-05 DIAGNOSIS — R262 Difficulty in walking, not elsewhere classified: Secondary | ICD-10-CM | POA: Diagnosis present

## 2018-07-05 DIAGNOSIS — M5442 Lumbago with sciatica, left side: Secondary | ICD-10-CM | POA: Insufficient documentation

## 2018-07-05 NOTE — Therapy (Signed)
[facility] [address]  [place], [place], [address] Phone: [phone]   Fax:  [phone]  Physical Therapy Treatment  Patient Details  Name: Reginald Rios MRN: [id number] Date of Birth: 06/28/1955 Referring Provider (PT): Karlton Lemon, MD   Encounter Date: 07/05/2018  PT End of Session - 07/05/18 0914    Visit Number  7    Number of Visits  17    Date for PT Re-Evaluation  07/13/18    Authorization Type  [facility]    PT Start Time  [phone]   pt late   PT Stop Time  0930    PT Time Calculation (min)  39 min    Activity Tolerance  Patient tolerated treatment well    Behavior During Therapy  [facility] for tasks assessed/performed       Past Medical History:  Diagnosis Date  . ADD (attention deficit disorder)   . Anemia   . Arthritis   . CAD (coronary artery disease)    2/19 PCI/DESx1 to Lcx  . Diabetes mellitus without complication ([place])   . Exercise-induced asthma   . Heart murmur   . Hyperlipidemia    family hx of high cholesterol  . Hypogonadism in male   . Low back pain   . Osteoarthritis     Past Surgical History:  Procedure Laterality Date  . CORONARY STENT INTERVENTION N/A 06/23/2017   Procedure: CORONARY STENT INTERVENTION;  Surgeon: Leonie Man, MD;  Location: [facility];  Service: Cardiovascular;  Laterality: N/A;  . INTRAVASCULAR PRESSURE WIRE/FFR STUDY N/A 06/23/2017   Procedure: INTRAVASCULAR PRESSURE WIRE/FFR STUDY;  Surgeon: Leonie Man, MD;  Location: [facility];  Service: Cardiovascular;  Laterality: N/A;  . KNEE ARTHROSCOPY    . LEFT HEART CATH AND CORONARY ANGIOGRAPHY N/A 06/23/2017   Procedure: LEFT HEART CATH AND CORONARY ANGIOGRAPHY;  Surgeon: Leonie Man, MD;  Location: [facility];  Service: Cardiovascular;  Laterality: N/A;  . TENOTOMY ACHILLES TENDON      There were no vitals filed for this visit.  Subjective Assessment - 07/05/18 0852    Subjective  Reports that he has been in [place]. Had an injection to his neck. Back has been feeling good lately. Did have a flare up of back pain after walking for 2 hours last thursday but it resolved within 2 days. Believes that swimming has helped his back.     Pertinent History  OA, LBP, HLD, heart murmur, asthma, DM, CAD, anemia, ADD, achilles tenotomy, L heart cath & angio 06/2017, coronary stent 06/2017    Diagnostic tests  none    Patient Stated Goals  getting pain levels down    Currently in Pain?  Yes    Pain Score  2     Pain Location  Back    Pain Orientation  Left    Pain Descriptors / Indicators  Aching    Pain Type  Chronic pain    Pain Radiating Towards  radiating down L posterior LE                       [facility] Adult PT Treatment/Exercise - 07/05/18 0001      Exercises   Exercises  Knee/Hip      Lumbar Exercises: Stretches   Piriformis Stretch  Right;Left;2 reps;30 seconds    Piriformis Stretch Limitations  supine KTOS,     Figure 4 Stretch  2  reps;30 seconds;With overpressure    Figure 4 Stretch Limitations  to tolerance      Lumbar Exercises: Aerobic   Nustep  Lvl 3, 6 min (UE/LE)      Lumbar Exercises: Supine   Bridge  15 reps;3 seconds    Bridge Limitations  limited ROM      Lumbar Exercises: Sidelying   Clam  Right;Left;15 reps    Clam Limitations  red TB around knees   cues to avoid rotating back     Knee/Hip Exercises: Standing   Functional Squat  2 sets;10 reps    Functional Squat Limitations  at coutner top   cues to keep chest up and lead with the bottom     Knee/Hip Exercises: Seated   Other Seated Knee/Hip Exercises  sitting B hip IR with yellow TB 2x10   decreased ROM on R LE     Manual Therapy   Manual Therapy  Myofascial release;Soft tissue mobilization    Manual therapy comments  prone    Soft tissue mobilization  STM to L piriformis and glute- most tenderness and soft tissue restriction in medial glute and piriformis     Myofascial Release  TPR to L piriformis and glute             PT Education - 07/05/18 0927    Education Details  update to HEP; given yellow TB    Person(s) Educated  Patient    Methods  Explanation;Demonstration;Tactile cues;Verbal cues;Handout    Comprehension  Verbalized understanding;Returned demonstration       PT Short Term Goals - 06/01/18 0907      PT SHORT TERM GOAL #1   Title  Patient to be independent with initial HEP.    Time  4    Period  Weeks    Status  Achieved        PT Long Term Goals - 06/01/18 4097      PT LONG TERM GOAL #1   Title  Patient to be independent with advanced HEP.    Time  8    Period  Weeks    Status  Partially Met      PT LONG TERM GOAL #2   Title  Patient to demonstrated [facility] and pain-free lumbar AROM.    Time  8    Period  Weeks    Status  Partially Met      PT LONG TERM GOAL #3   Title  Patient to demonstrate >=4+/5 strength in B LEs.    Time  8    Period  Weeks    Status  On-going   not addressed today due to "flare-up" of back pain following lifting heavy cart at work     PT [place] #4   Title  Patient to report tolerance for 2 hours of sitting without pain limiting.     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     Time  8    Period  Weeks    Status  On-going   pt. noting improvement in this area since starting therapy           Plan - 07/05/18 0914    Clinical Impression Statement  Patient arrived to  session with report of being in [facility], and receiving injection to his neck. Did report that LB has been feeling good recently but has been struggling with radiation of pain down posterior L LE.  Began session with STM to L piriformis and glute- most tenderness and soft tissue restriction in medial glute and piriformis. Patient reported  relief of pain. Followed manual therapy with LE stretching with cues to adjust positioning to intensify stretch. Patient with apparent weakness in R LE compared to L with hip strengthening exercises this date. Updated HEP with sitting hip IR- patient reported understanding. No complaints at end of session.    Rehab Potential  Good    PT Treatment/Interventions  ADLs/Self Care Home Management;Cryotherapy;Electrical Stimulation;Functional mobility training;Stair training;Gait training;DME Instruction;Ultrasound;Traction;Moist Heat;Therapeutic activities;Therapeutic exercise;Balance training;Neuromuscular re-education;Patient/family education;Orthotic Fit/Training;Passive range of motion;Manual techniques;Dry needling;Energy conservation;Splinting;Taping    PT Next Visit Plan  Progress lumbar ROM and lumbopelvic strengthening per pt    Consulted and Agree with Plan of Care  Patient       Patient will benefit from skilled therapeutic intervention in order to improve the following deficits and impairments:  Decreased range of motion, Difficulty walking, Increased muscle spasms, Decreased activity tolerance, Pain, Improper body mechanics, Impaired flexibility, Hypomobility, Decreased balance, Decreased strength, Postural dysfunction  Visit Diagnosis: Chronic bilateral low back pain with left-sided sciatica  Other symptoms and signs involving the musculoskeletal system  Difficulty in walking, not elsewhere classified  Muscle weakness (generalized)     Problem List Patient Active Problem List   Diagnosis Date Noted  . Onychomycosis 05/25/2018  . Palpitations 07/06/2017  . CAD (coronary artery disease) 07/05/2017  . Family history of early CAD 07/05/2017  . Abnormal findings on diagnostic imaging of cardiovascular system 06/23/2017  . Low vitamin D level 04/22/2017  . Pain in right ankle and joints of right foot 10/05/2016  . Hemarthrosis of right elbow 02/03/2016  . Small thenar eminence  02/03/2016  . Cervical disc disorder with radiculopathy of cervical region 10/07/2015  . Incomplete rotator cuff tear 09/10/2015  . Nonischemic cardiomyopathy ([place]) 05/15/2015  . Fatigue 05/15/2015  . Dyspnea on exertion 05/15/2015  . Decreased cardiac ejection fraction 04/02/2015  . Strain of latissimus dorsi muscle 01/21/2015  . Shoulder bursitis 10/12/2014  . Stenosis of lateral recess of lumbar spine 10/12/2014  . BPPV (benign paroxysmal positional vertigo) 08/14/2014  . Hyperlipidemia 01/19/2014  . Anemia, unspecified 01/19/2014  . ADD (attention deficit disorder) 01/19/2014  . Type 2 diabetes mellitus ([place]) 12/22/2013  . Hypogonadism in male 12/22/2013  . Arthritis of hand, left 12/22/2013     Janene Harvey, PT, DPT 07/05/18 11:05 AM   [facility] [address]  [place], [place], [address] Phone: [phone]   Fax:  [phone]  Name: LIMUEL NIEBLAS MRN: [id number] Date of Birth: 1955-12-06

## 2018-07-12 MED FILL — JANUMET 50-1,000 MG TABLET: 50-1000 | 30 days supply | Qty: 60 | Fill #1

## 2018-07-13 ENCOUNTER — Encounter: Payer: Self-pay | Admitting: Family Medicine

## 2018-07-13 ENCOUNTER — Ambulatory Visit: Payer: 59 | Admitting: Physical Therapy

## 2018-07-13 ENCOUNTER — Encounter: Payer: Self-pay | Admitting: Hematology & Oncology

## 2018-07-13 DIAGNOSIS — H33311 Horseshoe tear of retina without detachment, right eye: Secondary | ICD-10-CM | POA: Diagnosis not present

## 2018-07-13 DIAGNOSIS — H2513 Age-related nuclear cataract, bilateral: Secondary | ICD-10-CM | POA: Diagnosis not present

## 2018-07-13 DIAGNOSIS — H43811 Vitreous degeneration, right eye: Secondary | ICD-10-CM | POA: Diagnosis not present

## 2018-07-13 DIAGNOSIS — H25041 Posterior subcapsular polar age-related cataract, right eye: Secondary | ICD-10-CM | POA: Diagnosis not present

## 2018-07-13 LAB — HM DIABETES EYE EXAM

## 2018-07-15 ENCOUNTER — Telehealth: Payer: Self-pay | Admitting: *Deleted

## 2018-07-15 ENCOUNTER — Other Ambulatory Visit: Payer: Self-pay | Admitting: Family Medicine

## 2018-07-15 MED ORDER — PRAVASTATIN SODIUM 20 MG PO TABS: 20.0000 mg | ORAL_TABLET | Freq: Every day | ORAL | 2 refills | Status: DC

## 2018-07-15 MED FILL — PRAVASTATIN NA 20 MG TAB: 20 | 90 days supply | Qty: 90 | Fill #0

## 2018-07-15 NOTE — Telephone Encounter (Signed)
Received Diabetic Eye Exam Report from [facility]; forwarded to provider/SLS 03/13

## 2018-07-18 ENCOUNTER — Other Ambulatory Visit: Payer: Self-pay | Admitting: Family Medicine

## 2018-07-18 DIAGNOSIS — I428 Other cardiomyopathies: Secondary | ICD-10-CM

## 2018-07-18 DIAGNOSIS — E785 Hyperlipidemia, unspecified: Secondary | ICD-10-CM

## 2018-07-18 DIAGNOSIS — R0609 Other forms of dyspnea: Secondary | ICD-10-CM

## 2018-07-18 DIAGNOSIS — I10 Essential (primary) hypertension: Secondary | ICD-10-CM

## 2018-07-18 DIAGNOSIS — R0600 Dyspnea, unspecified: Secondary | ICD-10-CM

## 2018-07-18 DIAGNOSIS — R5383 Other fatigue: Secondary | ICD-10-CM

## 2018-07-18 DIAGNOSIS — Z8249 Family history of ischemic heart disease and other diseases of the circulatory system: Secondary | ICD-10-CM

## 2018-07-18 MED ORDER — VENTOLIN HFA 108 (90 BASE) MCG/ACT IN AERS: 1.0000 | INHALATION_SPRAY | Freq: Four times a day (QID) | RESPIRATORY_TRACT | 4 refills | Status: DC | PRN

## 2018-07-19 MED FILL — VENTOLIN HFA 90 MCG INHALER: 108 (90 BAS | 50 days supply | Qty: 18 | Fill #0

## 2018-07-20 ENCOUNTER — Encounter: Payer: Self-pay | Admitting: Physical Therapy

## 2018-07-20 ENCOUNTER — Other Ambulatory Visit: Payer: Self-pay

## 2018-07-20 ENCOUNTER — Ambulatory Visit: Payer: 59 | Admitting: Physical Therapy

## 2018-07-20 DIAGNOSIS — R29898 Other symptoms and signs involving the musculoskeletal system: Secondary | ICD-10-CM

## 2018-07-20 DIAGNOSIS — R262 Difficulty in walking, not elsewhere classified: Secondary | ICD-10-CM | POA: Diagnosis not present

## 2018-07-20 DIAGNOSIS — M5442 Lumbago with sciatica, left side: Principal | ICD-10-CM

## 2018-07-20 DIAGNOSIS — G8929 Other chronic pain: Secondary | ICD-10-CM | POA: Diagnosis not present

## 2018-07-20 DIAGNOSIS — M6281 Muscle weakness (generalized): Secondary | ICD-10-CM

## 2018-07-20 NOTE — Therapy (Signed)
[facility] [address]  [place], [place], [address] Phone: [phone]   Fax:  [phone]  Physical Therapy Treatment  Patient Details  Name: Reginald Rios MRN: [id number] Date of Birth: 07/26/1955 Referring Provider (PT): Karlton Lemon, MD   Encounter Date: 07/20/2018  PT End of Session - 07/20/18 1413    Visit Number  8    Number of Visits  17    Date for PT Re-Evaluation  07/13/18    Authorization Type  [facility]    PT Start Time  0928    PT Stop Time  1012    PT Time Calculation (min)  44 min    Activity Tolerance  Patient tolerated treatment well    Behavior During Therapy  [facility] for tasks assessed/performed       Past Medical History:  Diagnosis Date  . ADD (attention deficit disorder)   . Anemia   . Arthritis   . CAD (coronary artery disease)    2/19 PCI/DESx1 to Lcx  . Diabetes mellitus without complication ([place])   . Exercise-induced asthma   . Heart murmur   . Hyperlipidemia    family hx of high cholesterol  . Hypogonadism in male   . Low back pain   . Osteoarthritis     Past Surgical History:  Procedure Laterality Date  . CORONARY STENT INTERVENTION N/A 06/23/2017   Procedure: CORONARY STENT INTERVENTION;  Surgeon: Leonie Man, MD;  Location: [facility];  Service: Cardiovascular;  Laterality: N/A;  . INTRAVASCULAR PRESSURE WIRE/FFR STUDY N/A 06/23/2017   Procedure: INTRAVASCULAR PRESSURE WIRE/FFR STUDY;  Surgeon: Leonie Man, MD;  Location: [facility];  Service: Cardiovascular;  Laterality: N/A;  . KNEE ARTHROSCOPY    . LEFT HEART CATH AND CORONARY ANGIOGRAPHY N/A 06/23/2017   Procedure: LEFT HEART CATH AND CORONARY ANGIOGRAPHY;  Surgeon: Leonie Man, MD;  Location: [facility];  Service: Cardiovascular;  Laterality: N/A;  . TENOTOMY ACHILLES TENDON      There were no vitals filed for this visit.  Subjective Assessment - 07/20/18 0929    Subjective   Reports that he has been feeling good lately. Would like to be placed on a 30 day hold today. Has been doing his lower back exercises and still playing tennis.     Pertinent History  OA, LBP, HLD, heart murmur, asthma, DM, CAD, anemia, ADD, achilles tenotomy, L heart cath & angio 06/2017, coronary stent 06/2017    Diagnostic tests  none    Patient Stated Goals  getting pain levels down    Currently in Pain?  Yes    Pain Score  3     Pain Location  Back    Pain Orientation  Right;Lower    Pain Descriptors / Indicators  Sharp    Pain Type  Chronic pain         [facility] PT Assessment - 07/20/18 0001      Observation/Other Assessments   Focus on Therapeutic Outcomes (FOTO)   Lumbar: 52 (48% limited, 40% predicted)      AROM   AROM Assessment Site  Lumbar    Lumbar Flexion  distal shin   mild R LBP   Lumbar Extension  severely limited   mild R LBP   Lumbar - Right Side Bend  mid thigh    Lumbar - Left Side Bend  mid thigh    Lumbar - Right Rotation  [facility]  L LBP   Lumbar - Left Rotation  WFL   L LBP     Strength   Right Hip Flexion  4/5    Right Hip ABduction  4/5    Right Hip ADduction  4+/5    Left Hip Flexion  4/5    Left Hip ABduction  4/5    Left Hip ADduction  4+/5    Right Knee Flexion  5/5    Right Knee Extension  5/5    Left Knee Flexion  5/5    Left Knee Extension  5/5    Right Ankle Dorsiflexion  4+/5    Right Ankle Plantar Flexion  4/5    Left Ankle Dorsiflexion  4+/5    Left Ankle Plantar Flexion  4/5                   [facility] Adult PT Treatment/Exercise - 07/20/18 0001      Lumbar Exercises: Aerobic   Nustep  Lvl 4, 6 min (UE/LE)      Lumbar Exercises: Prone   Other Prone Lumbar Exercises  prone on elbows 10x3"   cues for sequencing     Knee/Hip Exercises: Standing   Hip Flexion  Stengthening;Right;Left;1 set;20 reps;Knee bent    Hip Flexion Limitations  resisted marching at counter top with red TB   cues for upright posture     Knee/Hip  Exercises: Sidelying   Clams  with red TB x15 each side   cues to rotate hips forward            PT Education - 07/20/18 1010    Education Details  update and consolidation to HEP; discussion on objective progress with PT thus far; discussion on proper body mechanics with work and recreational activities to avoid LBP flare    Person(s) Educated  Patient    Methods  Explanation;Demonstration;Tactile cues;Verbal cues;Handout    Comprehension  Verbalized understanding;Returned demonstration       PT Short Term Goals - 07/20/18 0933      PT SHORT TERM GOAL #1   Title  Patient to be independent with initial HEP.    Time  4    Period  Weeks    Status  Achieved        PT Long Term Goals - 07/20/18 0933      PT LONG TERM GOAL #1   Title  Patient to be independent with advanced HEP.    Time  8    Period  Weeks    Status  Achieved      PT LONG TERM GOAL #2   Title  Patient to demonstrated [facility] and pain-free lumbar AROM.    Time  8    Period  Weeks    Status  Partially Met   showing improvements in B lumbar rotation     PT LONG TERM GOAL #3   Title  Patient to demonstrate >=4+/5 strength in B LEs.    Time  8    Period  Weeks    Status  Partially Met   improvements in B hip adduction and B DF strength     PT LONG TERM GOAL #4   Title  Patient to report tolerance for 2 hours of sitting without pain limiting.     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     Time  8    Period  Weeks    Status  Achieved            Plan - 07/20/18 1421    Clinical Impression Statement  Patient arrived to session with report of recent improvement in LBP. Notes that he has now returned to playing tennis. Patient has met or partially met all goals at this time. Patient playing tennis without  restrictions and has tolerated up to 6 hours of sitting without increase in pain while on a trip to [place]. Patient has shown improvements in B hip adduction and B DF strength; still limited in hip flexion and abduction. Also demonstrated improvements in B lumbar rotation AROM. Updated HEP to address remaining limitations- patient reported understanding. Tolerated all ther-ex well today, only with report of lumbar stiffness, but declined modalities. Patient placed on 30 day hold at this time per his request.     PT Treatment/Interventions  ADLs/Self Care Home Management;Cryotherapy;Electrical Stimulation;Functional mobility training;Stair training;Gait training;DME Instruction;Ultrasound;Traction;Moist Heat;Therapeutic activities;Therapeutic exercise;Balance training;Neuromuscular re-education;Patient/family education;Orthotic Fit/Training;Passive range of motion;Manual techniques;Dry needling;Energy conservation;Splinting;Taping    PT Next Visit Plan  30 day hold at this time    Consulted and Agree with Plan of Care  Patient       Patient will benefit from skilled therapeutic intervention in order to improve the following deficits and impairments:  Decreased range of motion, Difficulty walking, Increased muscle spasms, Decreased activity tolerance, Pain, Improper body mechanics, Impaired flexibility, Hypomobility, Decreased balance, Decreased strength, Postural dysfunction  Visit Diagnosis: Chronic bilateral low back pain with left-sided sciatica  Other symptoms and signs involving the musculoskeletal system  Difficulty in walking, not elsewhere classified  Muscle weakness (generalized)     Problem List Patient Active Problem List   Diagnosis Date Noted  . Onychomycosis 05/25/2018  . Palpitations 07/06/2017  . CAD (coronary artery disease) 07/05/2017  . Family history of early CAD 07/05/2017  . Abnormal findings on diagnostic imaging of cardiovascular system 06/23/2017  . Low vitamin D  level 04/22/2017  . Pain in right ankle and joints of right foot 10/05/2016  . Hemarthrosis of right elbow 02/03/2016  . Small thenar eminence 02/03/2016  . Cervical disc disorder with radiculopathy of cervical region 10/07/2015  . Incomplete rotator cuff tear 09/10/2015  . Nonischemic cardiomyopathy ([place]) 05/15/2015  . Fatigue 05/15/2015  . Dyspnea on exertion 05/15/2015  . Decreased cardiac ejection fraction 04/02/2015  . Strain of latissimus dorsi muscle 01/21/2015  . Shoulder bursitis 10/12/2014  . Stenosis of lateral recess of lumbar spine 10/12/2014  . BPPV (benign paroxysmal positional vertigo) 08/14/2014  . Hyperlipidemia 01/19/2014  . Anemia, unspecified 01/19/2014  . ADD (attention deficit disorder) 01/19/2014  . Type 2 diabetes mellitus ([place]) 12/22/2013  . Hypogonadism in male 12/22/2013  . Arthritis of hand, left 12/22/2013     Janene Harvey, PT, DPT 07/20/18 2:23 PM   [facility] [address]  [place], [place], [address] Phone: [phone]   Fax:  [phone]  Name: Reginald Rios MRN: [id number] Date of Birth: 1955/10/02

## 2018-07-26 ENCOUNTER — Other Ambulatory Visit: Payer: Self-pay | Admitting: Family Medicine

## 2018-07-26 MED FILL — PIOGLITAZONE HCL 30 MG TAB: 30 | 60 days supply | Qty: 60 | Fill #2

## 2018-07-26 MED FILL — METHYLPHENIDATE 20 MG TAB: 20 | 30 days supply | Qty: 30 | Fill #0

## 2018-07-26 MED FILL — EZETIMIBE 10 MG TABS: 10 | 60 days supply | Qty: 60 | Fill #2

## 2018-07-26 NOTE — Telephone Encounter (Signed)
Requesting: Ritalin Contract: N/A UDS: N/A Last OV: 05/25/2018 Next OV: 08/24/2018 Last Refill: 06/23/2018, #30--0 RF Database:   Please advise

## 2018-07-27 ENCOUNTER — Ambulatory Visit: Payer: 59 | Admitting: Family Medicine

## 2018-08-04 MED FILL — CLOPIDOGREL 75 MG TABLET: 75 | 90 days supply | Qty: 90 | Fill #1

## 2018-08-22 ENCOUNTER — Other Ambulatory Visit: Payer: Self-pay

## 2018-08-22 ENCOUNTER — Encounter (HOSPITAL_BASED_OUTPATIENT_CLINIC_OR_DEPARTMENT_OTHER): Payer: Self-pay | Admitting: Emergency Medicine

## 2018-08-22 ENCOUNTER — Emergency Department (HOSPITAL_BASED_OUTPATIENT_CLINIC_OR_DEPARTMENT_OTHER)
Admission: EM | Admit: 2018-08-22 | Discharge: 2018-08-22 | Disposition: A | Discharge status: Home / Self Care | Payer: 59 | Attending: Emergency Medicine | Admitting: Emergency Medicine

## 2018-08-22 DIAGNOSIS — S161XXA Strain of muscle, fascia and tendon at neck level, initial encounter: Secondary | ICD-10-CM | POA: Diagnosis not present

## 2018-08-22 DIAGNOSIS — X509XXA Other and unspecified overexertion or strenuous movements or postures, initial encounter: Secondary | ICD-10-CM | POA: Insufficient documentation

## 2018-08-22 DIAGNOSIS — Y939 Activity, unspecified: Secondary | ICD-10-CM | POA: Diagnosis not present

## 2018-08-22 DIAGNOSIS — Z7982 Long term (current) use of aspirin: Secondary | ICD-10-CM | POA: Insufficient documentation

## 2018-08-22 DIAGNOSIS — I2510 Atherosclerotic heart disease of native coronary artery without angina pectoris: Secondary | ICD-10-CM | POA: Insufficient documentation

## 2018-08-22 DIAGNOSIS — S199XXA Unspecified injury of neck, initial encounter: Secondary | ICD-10-CM | POA: Diagnosis present

## 2018-08-22 DIAGNOSIS — Z7901 Long term (current) use of anticoagulants: Secondary | ICD-10-CM | POA: Insufficient documentation

## 2018-08-22 DIAGNOSIS — Y999 Unspecified external cause status: Secondary | ICD-10-CM | POA: Insufficient documentation

## 2018-08-22 DIAGNOSIS — Z79899 Other long term (current) drug therapy: Secondary | ICD-10-CM | POA: Insufficient documentation

## 2018-08-22 DIAGNOSIS — Y92009 Unspecified place in unspecified non-institutional (private) residence as the place of occurrence of the external cause: Secondary | ICD-10-CM | POA: Diagnosis not present

## 2018-08-22 DIAGNOSIS — E119 Type 2 diabetes mellitus without complications: Secondary | ICD-10-CM | POA: Diagnosis not present

## 2018-08-22 MED ORDER — HYDROMORPHONE HCL 1 MG/ML IJ SOLN
2.0000 mg | Freq: Once | INTRAMUSCULAR | Status: AC
  Administered 2018-08-22: 2 mg via INTRAMUSCULAR
  Filled 2018-08-22: qty 2

## 2018-08-22 MED ORDER — ONDANSETRON 4 MG PO TBDP
4.0000 mg | ORAL_TABLET | Freq: Once | ORAL | Status: AC
  Administered 2018-08-22: 09:00:00 4 mg via ORAL
  Filled 2018-08-22: qty 1

## 2018-08-22 MED ORDER — LIDOCAINE 5 % EX PTCH: 1.0000 | MEDICATED_PATCH | CUTANEOUS | 0 refills | Status: DC

## 2018-08-22 MED FILL — LIDOCAINE PATCH 5%: 5 | 30 days supply | Qty: 30 | Fill #0

## 2018-08-22 NOTE — ED Provider Notes (Signed)
[facility] EMERGENCY DEPARTMENT Provider Note   CSN: [id number] Arrival date & time: 08/22/18  0757    History   Chief Complaint Chief Complaint  Patient presents with  . Neck Pain    HPI Reginald Rios is a 63 y.o. male.     HPI  63 year old male presents with neck pain and stiffness.  Started yesterday afternoon and has progressively worsened.  He has been working more at the house, especially painting the ceiling and looking straight up.  The pain is mostly in the middle of his neck and now radiates to his head.  No fevers.  He has a lot of chronic neck and back issues and has had surgery by Dr. Ellene Route.  Over the last 3 weeks he is felt some numbness to his right pinky and feels like his arm is a little weaker.  This is not new or worse since this neck pain started.  He has tried taken some hydrocodone and then tried some Valium 5 mg from his wife.  Neither of these have helped.  He tried cool compresses. He did not take the hydrocodone this AM due to him coming in to the ED.  Past Medical History:  Diagnosis Date  . ADD (attention deficit disorder)   . Anemia   . Arthritis   . CAD (coronary artery disease)    2/19 PCI/DESx1 to Lcx  . Diabetes mellitus without complication ([place])   . Exercise-induced asthma   . Heart murmur   . Hyperlipidemia    family hx of high cholesterol  . Hypogonadism in male   . Low back pain   . Osteoarthritis     Patient Active Problem List   Diagnosis Date Noted  . Onychomycosis 05/25/2018  . Palpitations 07/06/2017  . CAD (coronary artery disease) 07/05/2017  . Family history of early CAD 07/05/2017  . Abnormal findings on diagnostic imaging of cardiovascular system 06/23/2017  . Low vitamin D level 04/22/2017  . Pain in right ankle and joints of right foot 10/05/2016  . Hemarthrosis of right elbow 02/03/2016  . Small thenar eminence 02/03/2016  . Cervical disc disorder with radiculopathy of cervical region 10/07/2015  .  Incomplete rotator cuff tear 09/10/2015  . Nonischemic cardiomyopathy ([place]) 05/15/2015  . Fatigue 05/15/2015  . Dyspnea on exertion 05/15/2015  . Decreased cardiac ejection fraction 04/02/2015  . Strain of latissimus dorsi muscle 01/21/2015  . Shoulder bursitis 10/12/2014  . Stenosis of lateral recess of lumbar spine 10/12/2014  . BPPV (benign paroxysmal positional vertigo) 08/14/2014  . Hyperlipidemia 01/19/2014  . Anemia, unspecified 01/19/2014  . ADD (attention deficit disorder) 01/19/2014  . Type 2 diabetes mellitus ([place]) 12/22/2013  . Hypogonadism in male 12/22/2013  . Arthritis of hand, left 12/22/2013    Past Surgical History:  Procedure Laterality Date  . CORONARY STENT INTERVENTION N/A 06/23/2017   Procedure: CORONARY STENT INTERVENTION;  Surgeon: Leonie Man, MD;  Location: [facility];  Service: Cardiovascular;  Laterality: N/A;  . INTRAVASCULAR PRESSURE WIRE/FFR STUDY N/A 06/23/2017   Procedure: INTRAVASCULAR PRESSURE WIRE/FFR STUDY;  Surgeon: Leonie Man, MD;  Location: [facility];  Service: Cardiovascular;  Laterality: N/A;  . KNEE ARTHROSCOPY    . LEFT HEART CATH AND CORONARY ANGIOGRAPHY N/A 06/23/2017   Procedure: LEFT HEART CATH AND CORONARY ANGIOGRAPHY;  Surgeon: Leonie Man, MD;  Location: [facility];  Service: Cardiovascular;  Laterality: N/A;  . TENOTOMY ACHILLES TENDON  Home Medications    Prior to Admission medications   Medication Sig Start Date End Date Taking? Authorizing Provider  ADVAIR HFA 115-21 MCG/ACT inhaler Inhale 2 puffs into the lungs 2 (two) times daily as needed.  12/14/17   [provider]  aspirin EC 81 MG tablet Take 81 mg by mouth daily.    [provider]  clopidogrel (PLAVIX) 75 MG tablet TAKE 1 TABLET (75 MG TOTAL) BY MOUTH DAILY WITH BREAKFAST. 04/25/18   Cheryln Manly, NP  diclofenac sodium (VOLTAREN) 1 % GEL Apply 4 g topically 4 (four) times daily. Patient taking  differently: Apply 4 g topically 4 (four) times daily as needed.  08/25/17   Sheard, Myeong O, DPM  ezetimibe (ZETIA) 10 MG tablet TAKE 1 TABLET (10 MG TOTAL) BY MOUTH DAILY. 05/31/18 08/29/18  Richardo Priest, MD  gabapentin (NEURONTIN) 100 MG capsule Take 1 capsule (100 mg total) by mouth at bedtime. 05/25/18   Wendling, Crosby Oyster, DO  JANUMET 50-1000 MG per tablet TAKE 1 TABLET BY MOUTH TWICE DAILY WITH A MEAL 10/24/14   Yoo, Doe-Hyun R, DO  lidocaine (LIDODERM) 5 % Place 1 patch onto the skin daily. Remove & Discard patch within 12 hours or as directed by MD 08/22/18   Sherwood Gambler, MD  losartan (COZAAR) 25 MG tablet Take 1 tablet (25 mg total) by mouth daily. 11/22/17   Dorothy Spark, MD  methylphenidate (RITALIN) 20 MG tablet TAKE 1 TABLET (20 MG TOTAL) BY MOUTH DAILY. 07/26/18   [name], DO  montelukast (SINGULAIR) 10 MG tablet Take 10 mg by mouth daily. 10/11/17   [provider]  nitroGLYCERIN (NITROSTAT) 0.4 MG SL tablet Place 1 tablet (0.4 mg total) under the tongue every 5 (five) minutes as needed. 06/23/17   Cheryln Manly, NP  pioglitazone (ACTOS) 30 MG tablet Take 1 tablet (30 mg total) by mouth daily. 05/26/18   [name], DO  pravastatin (PRAVACHOL) 20 MG tablet Take 1 tablet (20 mg total) by mouth daily. 07/15/18   [name], DO  sildenafil (VIAGRA) 50 MG tablet Take 1 tablet (50 mg total) by mouth daily as needed for erectile dysfunction. 07/21/17   Dorothy Spark, MD  testosterone (ANDROGEL) 50 MG/5GM (1%) GEL  04/29/18   [provider]  VENTOLIN HFA 108 (90 Base) MCG/ACT inhaler Inhale 1 puff into the lungs every 6 (six) hours as needed (cough). 07/18/18   [name], DO    Family History Family History  Problem Relation Age of Onset  . Lung cancer Mother 36  . Cancer Mother        lung cancer  . Coronary artery disease Father 79  . Heart disease Father   . Hypothyroidism Brother   . Colon  cancer Neg Hx   . Prostate cancer Neg Hx     Social History Social History   Tobacco Use  . Smoking status: Never Smoker  . Smokeless tobacco: Never Used  Substance Use Topics  . Alcohol use: Yes    Alcohol/week: 0.0 standard drinks    Comment: rare  . Drug use: No     Allergies   Invokana [canagliflozin]; Statins; and Lisinopril   Review of Systems Review of Systems  Constitutional: Negative for fever.  Cardiovascular: Negative for chest pain.  Gastrointestinal: Negative for vomiting.  Musculoskeletal: Positive for neck pain and neck stiffness.  Neurological: Positive for headaches.  All other systems reviewed and are negative.  Physical Exam Updated Vital Signs BP 135/72   Pulse (!) 101   Temp 98 F (36.7 C) (Oral)   Resp 16   Ht 5\' 10"  (1.778 m)   Wt 88.5 kg   SpO2 99%   BMI 27.98 kg/m   Physical Exam Vitals signs and nursing note reviewed.  Constitutional:      General: He is not in acute distress.    Appearance: He is well-developed. He is not ill-appearing or diaphoretic.  HENT:     Head: Normocephalic and atraumatic.     Right Ear: External ear normal.     Left Ear: External ear normal.     Nose: Nose normal.  Eyes:     General:        Right eye: No discharge.        Left eye: No discharge.  Neck:     Musculoskeletal: Neck supple. Decreased range of motion. Muscular tenderness present. No neck rigidity.   Cardiovascular:     Rate and Rhythm: Normal rate and regular rhythm.     Heart sounds: Normal heart sounds.  Pulmonary:     Effort: Pulmonary effort is normal.     Breath sounds: Normal breath sounds.  Skin:    General: Skin is warm and dry.  Neurological:     Mental Status: He is alert.     Comments: 5/5 strength in all 4 extremities. Grossly normal sensation. Normal finger to nose.   Psychiatric:        Mood and Affect: Mood is not anxious.      ED Treatments / Results  Labs (all labs ordered are listed, but only abnormal  results are displayed) Labs Reviewed - No data to display  EKG None  Radiology No results found.  Procedures Procedures (including critical care time)  Medications Ordered in ED Medications  HYDROmorphone (DILAUDID) injection 2 mg (2 mg Intramuscular Given 08/22/18 0835)  ondansetron (ZOFRAN-ODT) disintegrating tablet 4 mg (4 mg Oral Given 08/22/18 0914)     Initial Impression / Assessment and Plan / ED Course  I have reviewed the triage vital signs and the nursing notes.  Pertinent labs & imaging results that were available during my care of the patient were reviewed by me and considered in my medical decision making (see chart for details).        Patient is feeling better after IM Dilaudid.  He did feel little nauseated and dizzy and was given ODT Zofran.  Otherwise, his neuro exam is benign on my testing.  Given his prior surgeries and the 3 weeks of ulnar numbness and subjective arm weakness, I discussed he needs to follow-up with his neurosurgeon Dr. Ellene Route.  Today, I highly doubt he has a deep space neck infection or other emergency condition such as a spinal cord infection.  I think this is likely from looking up at the ceiling often and has neck muscle spasm/strain.  He is already on hydrocodone and Valium however, as I do not think further prescriptions in this course would be warranted.  He is unable to take NSAIDs as he is on Plavix.  There is no radiculopathy so I do not think prednisone would be warranted and would likely mess up his sugars.  I will prescribe topical medicine and advised follow-up with PCP.  Return precautions.  Final Clinical Impressions(s) / ED Diagnoses   Final diagnoses:  Acute strain of neck muscle, initial encounter    ED Discharge Orders  Ordered    lidocaine (LIDODERM) 5 %  Every 24 hours     08/22/18 0906           Sherwood Gambler, MD 08/22/18 [phone]

## 2018-08-22 NOTE — ED Triage Notes (Addendum)
Neck pain since yesterday after painting the ceiling.  Pain radiates to shoulders, upper back. Pt states it hurts to move, difficult to swallow, unable to sleep.  No fever.

## 2018-08-22 NOTE — Discharge Instructions (Addendum)
If you develop worsening, recurrent, or continued neck pain, numbness or weakness in the arms or leg legs, incontinence of your bowels or bladders, numbness of your buttocks, fever, chest pain, or any other new/concerning symptoms then return to the ER for evaluation.

## 2018-08-23 ENCOUNTER — Telehealth: Payer: Self-pay | Admitting: Family Medicine

## 2018-08-23 ENCOUNTER — Ambulatory Visit (INDEPENDENT_AMBULATORY_CARE_PROVIDER_SITE_OTHER): Payer: 59 | Admitting: Family Medicine

## 2018-08-23 ENCOUNTER — Encounter: Payer: Self-pay | Admitting: Family Medicine

## 2018-08-23 DIAGNOSIS — M5412 Radiculopathy, cervical region: Secondary | ICD-10-CM | POA: Diagnosis not present

## 2018-08-23 DIAGNOSIS — M542 Cervicalgia: Secondary | ICD-10-CM | POA: Diagnosis not present

## 2018-08-23 MED ORDER — PREDNISONE 20 MG PO TABS: 40.0000 mg | ORAL_TABLET | Freq: Every day | ORAL | 0 refills | Status: AC

## 2018-08-23 MED FILL — predniSONE 20 MG TABS: 20 | 5 days supply | Qty: 10 | Fill #0

## 2018-08-23 MED FILL — OXYCODONE-ACETAMINOPHEN 5-3: 5-325 | 10 days supply | Qty: 40 | Fill #0

## 2018-08-23 NOTE — Progress Notes (Signed)
Musculoskeletal Exam  Patient: Reginald Rios DOB: 11-14-1955  DOS: 08/23/2018  SUBJECTIVE:  Chief Complaint:   Chief Complaint  Patient presents with  . Fall  . Neck Pain    Reginald Rios is a 63 y.o.  male for evaluation and treatment of neck pain. Due to outbreak, we are interacting via web portal for an electronic face-to-face visit. I verified patient's ID using 2 identifiers.   Onset:  9 days ago. [place] in his garage on his knees, jolted his neck, felt worse pain the following day. +hx of cervical surg w plates/hardware in his C spine anteriorly. Location: midline neck w radiation to b/l sides Character:  aching, sharp and stabbing  Progression of issue:  is unchanged Associated symptoms: Decreased ROM, feels swelling in his throat; denies sob, swelling in mouth, ST, itching Treatment: to date has been muscle relaxers and opiates.   Neurovascular symptoms: no  ROS: Musculoskeletal/Extremities: +neck pain  Past Medical History:  Diagnosis Date  . ADD (attention deficit disorder)   . Anemia   . Arthritis   . CAD (coronary artery disease)    2/19 PCI/DESx1 to Lcx  . Diabetes mellitus without complication ([place])   . Exercise-induced asthma   . Heart murmur   . Hyperlipidemia    family hx of high cholesterol  . Hypogonadism in male   . Low back pain   . Osteoarthritis     Objective: No conversational dyspnea Age appropriate judgment and insight Nml affect and mood Appears to have decreased ROM of neck   Assessment:  Neck pain - Plan: predniSONE (DELTASONE) 20 MG tablet, DG Cervical Spine Complete  Plan: Orders as above. Ck Xr given hx of hardware. He is planning to reach out to Dr. Ellene Route, the surgeon who performed his procedure, as well.  Heat, stretches/exercises, Tylenol, prednisone as he is on DAPT.  F/u prn. The patient voiced understanding and agreement to the plan.   [place], DO 08/23/18  11:30 AM

## 2018-08-23 NOTE — Telephone Encounter (Signed)
Patient was seen in the ED yesterday... He feel in garage// having neck and back pain. They did no xrays and wife states in a lot of pain.  Would you order xrays??? He is scheduled to see you Friday///or move sooner????

## 2018-08-23 NOTE — Telephone Encounter (Signed)
If he wants XR's sooner than Friday, move appointment. If he would prefer to wait, OK to leave as is. Ty.

## 2018-08-23 NOTE — Telephone Encounter (Signed)
Spoke to the patient and rescheduled to today at 32

## 2018-08-24 ENCOUNTER — Ambulatory Visit: Payer: 59 | Admitting: Family Medicine

## 2018-08-26 ENCOUNTER — Ambulatory Visit: Payer: 59 | Admitting: Family Medicine

## 2018-08-30 ENCOUNTER — Other Ambulatory Visit: Payer: Self-pay | Admitting: Neurological Surgery

## 2018-08-30 DIAGNOSIS — M5412 Radiculopathy, cervical region: Secondary | ICD-10-CM

## 2018-09-04 ENCOUNTER — Other Ambulatory Visit: Payer: Self-pay

## 2018-09-04 ENCOUNTER — Ambulatory Visit (INDEPENDENT_AMBULATORY_CARE_PROVIDER_SITE_OTHER): Payer: 59

## 2018-09-04 DIAGNOSIS — M542 Cervicalgia: Secondary | ICD-10-CM | POA: Diagnosis not present

## 2018-09-04 DIAGNOSIS — M5412 Radiculopathy, cervical region: Secondary | ICD-10-CM

## 2018-09-05 ENCOUNTER — Other Ambulatory Visit: Payer: Self-pay | Admitting: Family Medicine

## 2018-09-05 ENCOUNTER — Other Ambulatory Visit: Payer: Self-pay | Admitting: Cardiology

## 2018-09-05 MED ORDER — METHYLPHENIDATE HCL 20 MG PO TABS: 20.0000 mg | ORAL_TABLET | Freq: Every day | ORAL | 0 refills | Status: DC

## 2018-09-05 MED FILL — JANUMET 50-1,000 MG TABLET: 50-1000 | 30 days supply | Qty: 60 | Fill #2

## 2018-09-05 MED FILL — LOSARTAN POTASSIUM 25 MG TA: 25 | 90 days supply | Qty: 90 | Fill #0

## 2018-09-05 MED FILL — METHYLPHENIDATE 20 MG TAB: 20 | 30 days supply | Qty: 30 | Fill #0

## 2018-09-07 DIAGNOSIS — M542 Cervicalgia: Secondary | ICD-10-CM | POA: Diagnosis not present

## 2018-09-07 IMAGING — MR MR LUMBAR SPINE W/O CM
7 series · 43 of 48 positions shown · non-contrast
Comparison: Thoracic spine radiograph July 09, 2016 and MRI of the
lumbar spine October 23, 2013

CLINICAL DATA: Low back pain for 1 month, RIGHT greater than LEFT
leg pain with tingling. No injury.

EXAM:
MRI LUMBAR SPINE WITHOUT CONTRAST
TECHNIQUE: Multiplanar, multisequence MR imaging of the lumbar spine was
performed. No intravenous contrast was administered.

[Series 3: T1 · sagittal · 4.0mm · 0.81mm/px · 6 of 16 slices shown (1 of 3)]
[im 1/16]
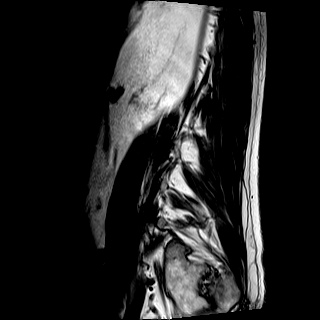
[im 4/16]
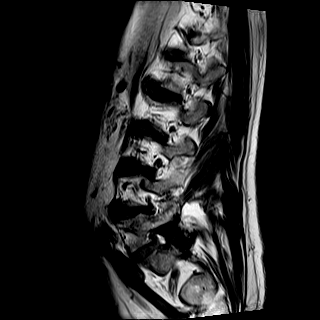
[im 7/16]
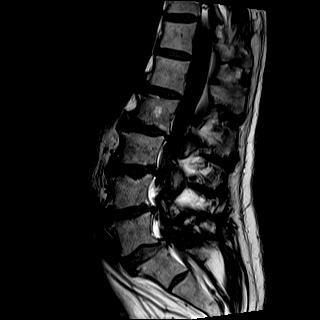
[im 10/16]
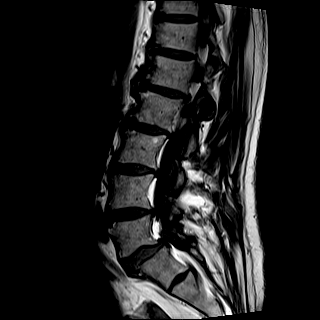
[im 13/16]
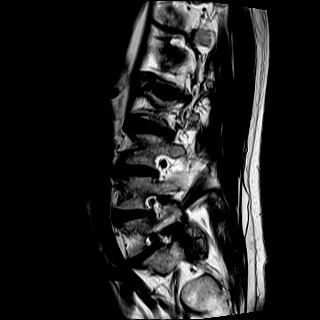
[im 16/16]
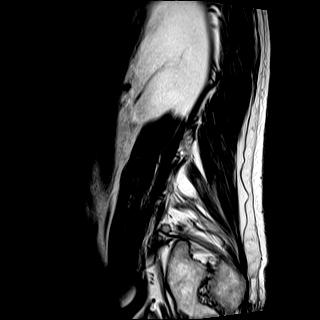

[Series 4: T2 · sagittal · 4.0mm · 0.81mm/px · 6 of 16 slices shown (1 of 3)]
[im 1/16]
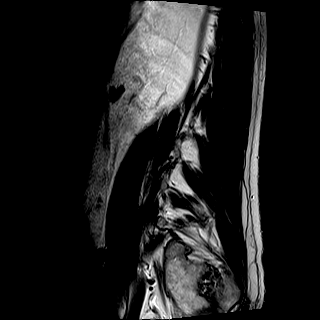
[im 4/16]
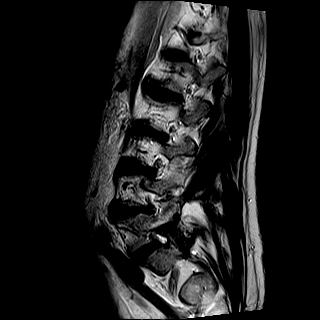
[im 7/16]
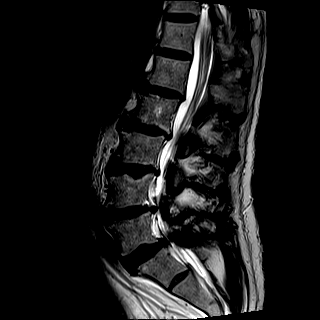
[im 10/16]
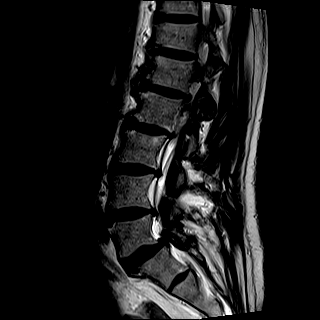
[im 13/16]
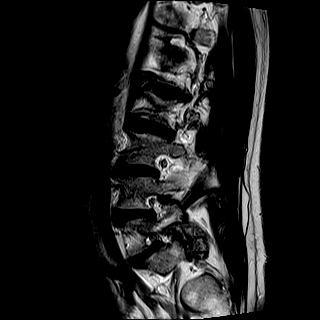
[im 16/16]
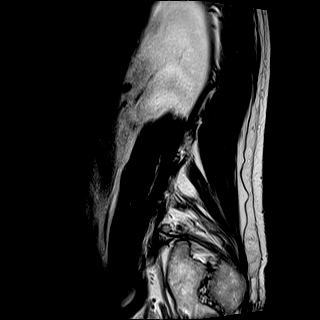

[Series 5: STIR · sagittal · 4.0mm · 0.81mm/px · 1 of 16 slices shown]
[im 1/16]
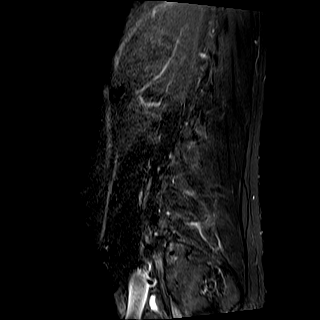

[Series 6: T2 · axial · 4.0mm · 0.62mm/px · z∈[+105,+198]mm · 7 of 20 slices shown (2 of 3)]
[im 1/20]
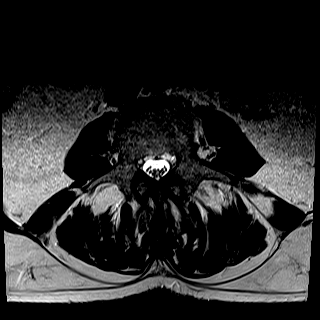
[im 4/20]
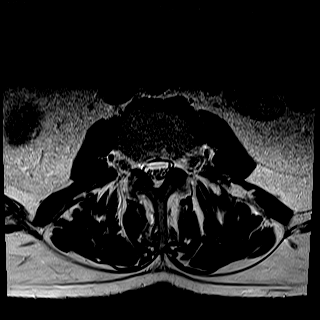
[im 7/20]
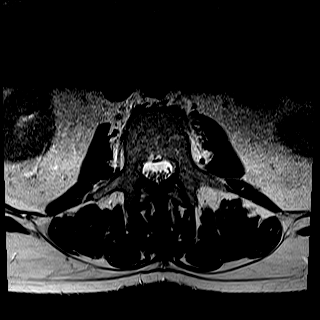
[im 10/20]
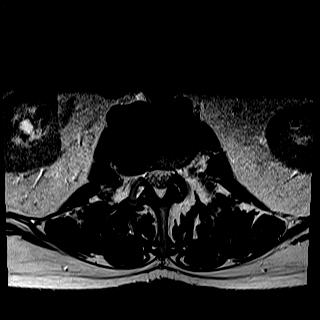
[im 13/20]
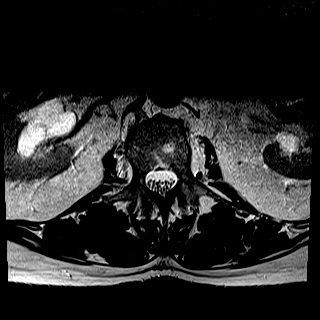
[im 16/20]
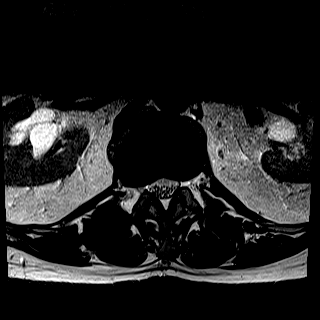
[im 20/20]
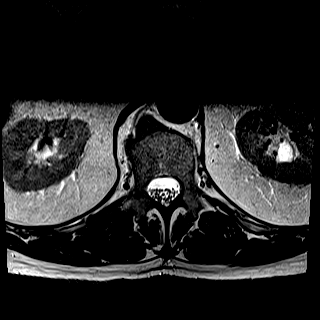

[Series 7: T2 · axial · 4.0mm · 0.62mm/px · z∈[+3,+106]mm · 8 of 22 slices shown (3 of 3)]
[im 1/22]
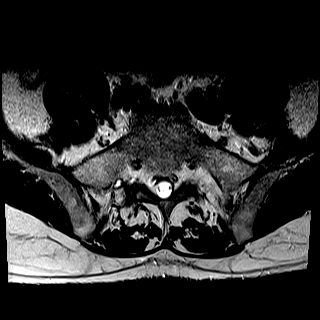
[im 4/22]
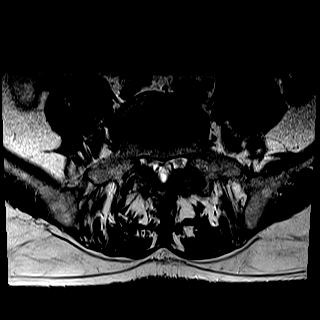
[im 7/22]
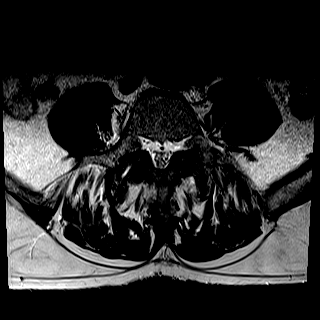
[im 10/22]
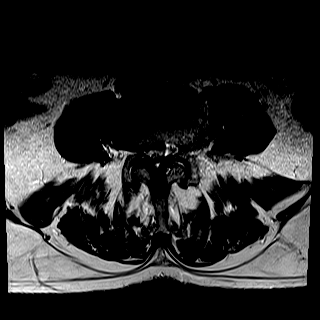
[im 13/22]
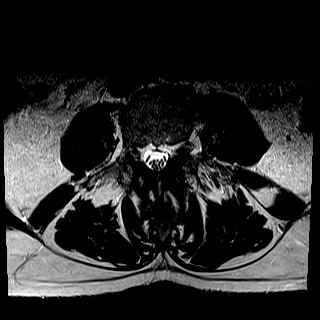
[im 16/22]
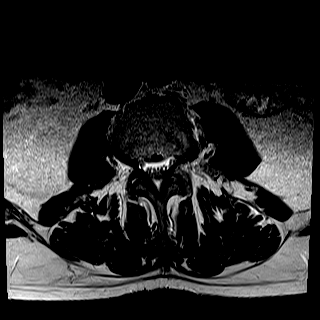
[im 19/22]
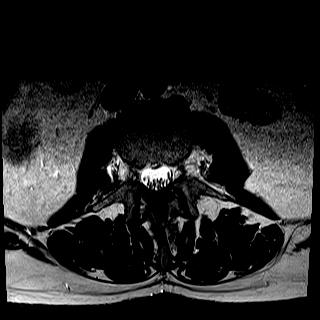
[im 22/22]
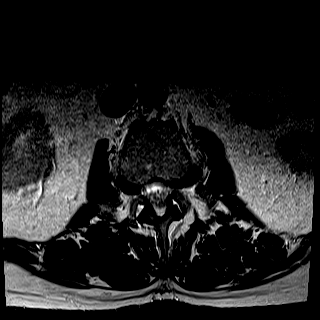

[Series 8: T1 · axial · 4.0mm · 0.62mm/px · z∈[+105,+198]mm · 7 of 20 slices shown (2 of 3)]
[im 1/20]
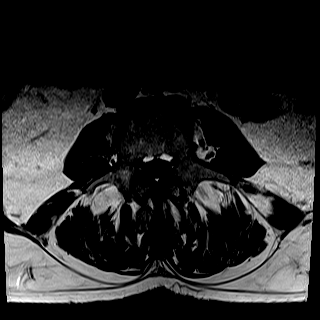
[im 4/20]
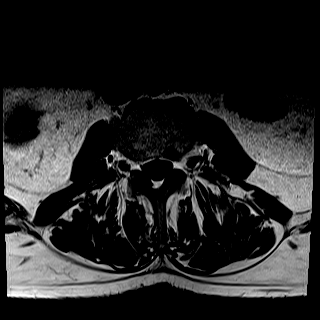
[im 7/20]
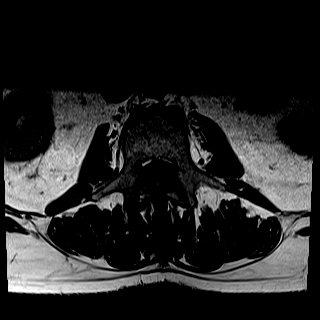
[im 10/20]
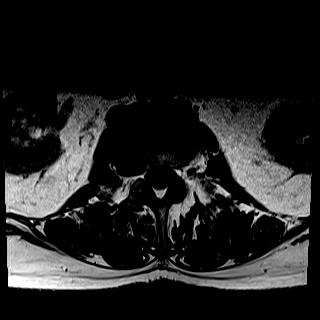
[im 13/20]
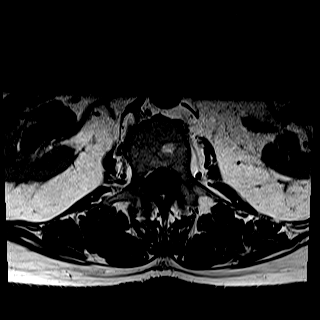
[im 16/20]
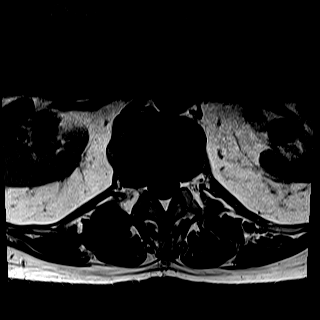
[im 20/20]
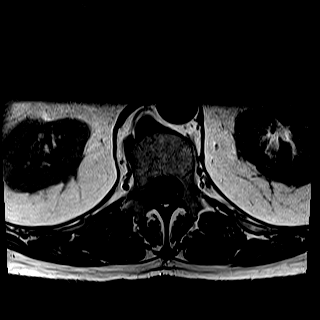

[Series 9: T1 · axial · 4.0mm · 0.62mm/px · z∈[+3,+106]mm · 8 of 22 slices shown (3 of 3)]
[im 1/22]
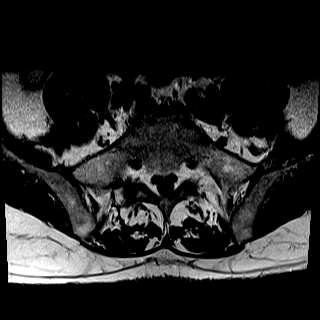
[im 4/22]
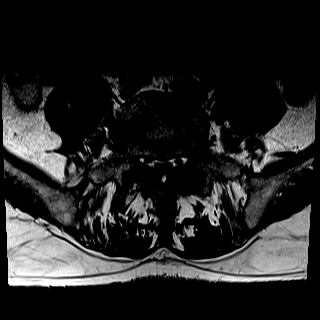
[im 7/22]
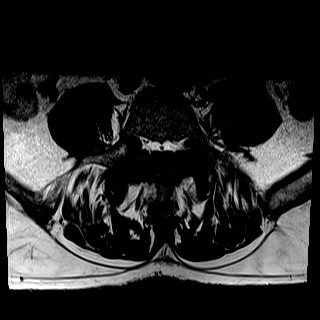
[im 10/22]
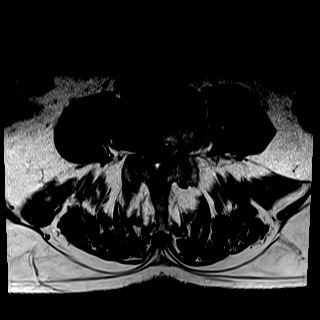
[im 13/22]
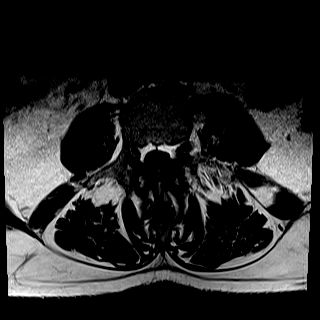
[im 16/22]
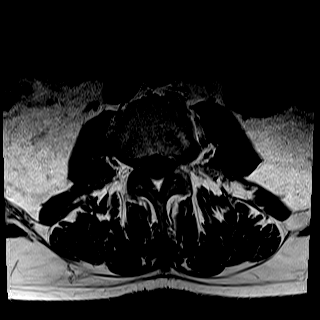
[im 19/22]
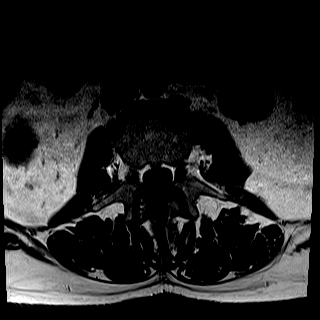
[im 22/22]
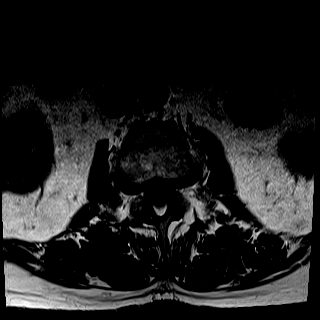

[43 of 48 positions shown; findings below may reference images not displayed]

FINDINGS: SEGMENTATION: For the purposes of this report, the last well-formed
intervertebral disc will be described as L5-S1.

ALIGNMENT: Maintenance of the lumbar lordosis. Similar grade 1 L5-S1
anterolisthesis and minimal grade 1 L2-3 retrolisthesis. No definite
pars interarticularis defects.

VERTEBRAE:Vertebral bodies are intact. Mild disc height loss all
lumbar levels with disc desiccation compatible with degenerative
discs. Mild to moderate subacute on chronic discogenic endplate
changes L1-2 through L5-S1. No suspicious or acute bone marrow
signal.

CONUS MEDULLARIS: Conus medullaris terminates at T12-L1 and
demonstrates normal morphology and signal characteristics. Cauda
equina is normal.

PARASPINAL AND SOFT TISSUES: Included prevertebral and paraspinal
soft tissues are nonacute. Bilateral extrarenal pelvises.

DISC LEVELS:

L1-2: Small broad-based disc bulge asymmetric to the RIGHT. Mild
facet arthropathy and ligamentum flavum redundancy without canal
stenosis or neural foraminal narrowing.

L2-3: Retrolisthesis. Small broad-based disc bulge. Mild facet
arthropathy and ligamentum flavum redundancy without canal stenosis
or neural foraminal narrowing.

L3-4: 4 mm broad-based disc bulge with tiny central disc extrusion,
2 mm contiguous inferior migration. Mild facet arthropathy and
ligamentum flavum redundancy. Minimal canal stenosis. No neural
foraminal narrowing.

L4-5: 4 mm broad-based disc bulge. Moderate facet arthropathy and
ligamentum flavum redundancy. Moderate canal stenosis with lateral
recess narrowing which could affect the traversing L5 nerves. Mild
RIGHT, moderate LEFT neural foraminal narrowing.

L5-S1: Anterolisthesis. 3 mm broad-based disc bulge. Severe facet
arthropathy and ligamentum flavum redundancy. Moderate canal
stenosis. Lateral recess narrowing may affect the traversing S1
nerves. Moderate to severe RIGHT, moderate LEFT neural foraminal
narrowing.
IMPRESSION: Similar grade 1 L5-S1 anterolisthesis, no definite pars
interarticularis defects. Stable minimal grade 1 L2-3
retrolisthesis.

Moderate canal stenosis L4-5 and L5-S1.

Neural foraminal narrowing L4-5 and L5-S1: Moderate to severe on the
RIGHT at L5-S1.

## 2018-09-14 ENCOUNTER — Other Ambulatory Visit: Payer: Self-pay | Admitting: Family Medicine

## 2018-09-14 ENCOUNTER — Ambulatory Visit (INDEPENDENT_AMBULATORY_CARE_PROVIDER_SITE_OTHER): Payer: 59 | Admitting: Family Medicine

## 2018-09-14 ENCOUNTER — Ambulatory Visit: Payer: 59 | Attending: Neurological Surgery | Admitting: Physical Therapy

## 2018-09-14 ENCOUNTER — Other Ambulatory Visit: Payer: Self-pay

## 2018-09-14 ENCOUNTER — Encounter: Payer: Self-pay | Admitting: Physical Therapy

## 2018-09-14 ENCOUNTER — Encounter: Payer: Self-pay | Admitting: Family Medicine

## 2018-09-14 VITALS — BP 108/63 | HR 62

## 2018-09-14 DIAGNOSIS — M542 Cervicalgia: Secondary | ICD-10-CM | POA: Diagnosis not present

## 2018-09-14 DIAGNOSIS — R29898 Other symptoms and signs involving the musculoskeletal system: Secondary | ICD-10-CM | POA: Diagnosis not present

## 2018-09-14 DIAGNOSIS — M6281 Muscle weakness (generalized): Secondary | ICD-10-CM | POA: Diagnosis not present

## 2018-09-14 DIAGNOSIS — R293 Abnormal posture: Secondary | ICD-10-CM | POA: Insufficient documentation

## 2018-09-14 DIAGNOSIS — E291 Testicular hypofunction: Secondary | ICD-10-CM | POA: Diagnosis not present

## 2018-09-14 MED ORDER — TESTOSTERONE 50 MG/5GM (1%) TD GEL: 10.0000 g | Freq: Every day | TRANSDERMAL | 5 refills | Status: DC

## 2018-09-14 MED FILL — TESTOSTERONE 50 MG/5GM (1%): 50 MG/5GM | 30 days supply | Qty: 300 | Fill #0

## 2018-09-14 NOTE — Telephone Encounter (Signed)
Schedule virtual today at 2

## 2018-09-14 NOTE — Therapy (Signed)
[facility] [address]  [place], [place], [address] Phone: [phone]   Fax:  [phone]  Physical Therapy Evaluation  Patient Details  Name: Reginald Rios MRN: [id number] Date of Birth: 08/21/1955 Referring Provider (PT): Kristeen Miss, MD   Encounter Date: 09/14/2018  PT End of Session - 09/14/18 1116    Visit Number  1    Number of Visits  17    Date for PT Re-Evaluation  11/09/18    Authorization Type  [facility]    PT Start Time  1014    PT Stop Time  1125    PT Time Calculation (min)  71 min    Activity Tolerance  Patient tolerated treatment well;Patient limited by pain    Behavior During Therapy  [facility] for tasks assessed/performed       Past Medical History:  Diagnosis Date  . ADD (attention deficit disorder)   . Anemia   . Arthritis   . CAD (coronary artery disease)    2/19 PCI/DESx1 to Lcx  . Diabetes mellitus without complication ([place])   . Exercise-induced asthma   . Heart murmur   . Hyperlipidemia    family hx of high cholesterol  . Hypogonadism in male   . Low back pain   . Osteoarthritis     Past Surgical History:  Procedure Laterality Date  . CORONARY STENT INTERVENTION N/A 06/23/2017   Procedure: CORONARY STENT INTERVENTION;  Surgeon: Leonie Man, MD;  Location: [facility];  Service: Cardiovascular;  Laterality: N/A;  . INTRAVASCULAR PRESSURE WIRE/FFR STUDY N/A 06/23/2017   Procedure: INTRAVASCULAR PRESSURE WIRE/FFR STUDY;  Surgeon: Leonie Man, MD;  Location: [facility];  Service: Cardiovascular;  Laterality: N/A;  . KNEE ARTHROSCOPY    . LEFT HEART CATH AND CORONARY ANGIOGRAPHY N/A 06/23/2017   Procedure: LEFT HEART CATH AND CORONARY ANGIOGRAPHY;  Surgeon: Leonie Man, MD;  Location: [place];  Service: Cardiovascular;  Laterality: N/A;  . TENOTOMY ACHILLES TENDON      Vitals:   09/14/18 1015  BP: 108/63  Pulse: 62  SpO2: 98%      Subjective Assessment - 09/14/18 1024    Subjective  Patient reports that about 6 weeks ago he fell over onto his knees on a concrete floor and hurt his neck. Delayed onset of neck pain occurred 4 days later. Had trouble resting head back on pillow when laying supine, then trouble looking up or turning R/L. Had MRI- which showed no broken bones but was told that his neck is "fragile." Has been seeing chiropractor since the incident and this has been helping. Pain is located is the R posterior neck with intermittent radiation of pain & N/T to R or L dorsal forearms. Feels that his neck is tight, has trouble looking up to serve during tennis, rotating head, trouble reaching overhead with R UE, lifting weight. Feels that his arm strength has decreased. Better with chiropractor treatments and oxycodone. Believes his cervical fusion was 2 years ago.    Pertinent History  OA, LBP, HLD, heart murmur, asthma, DM, CAD, anemia, ADD, achilles tenotomy, L heart cath & angio 06/2017, coronary stent 06/2017, C4-6 fusion     Diagnostic tests  09/04/18 cervical MRI: No acute osseous injury of the cervical spine. Anterior cervical fusion from C4-C6. Moderate B foraminal stenosis at C4-5. Likely mild myelomalacia C5-6. At C3-4 there is moderate left facet arthropathy and left uncovertebral degenerative changes. Severe  L foraminal stenosis.     Patient Stated Goals  "I'd like the pain to stop"    Currently in Pain?  Yes    Pain Score  7     Pain Location  Neck    Pain Orientation  Right;Posterior    Pain Descriptors / Indicators  Aching    Pain Type  Chronic pain         [facility] PT Assessment - 09/14/18 1033      Assessment   Medical Diagnosis  Cervicalgia    Referring Provider (PT)  Kristeen Miss, MD    Onset Date/Surgical Date  08/03/18    Hand Dominance  Right    Next MD Visit  --   not scheduled   Prior Therapy  Yes      Precautions   Precautions  --   asthma     Restrictions   Weight Bearing  Restrictions  No      Balance Screen   Has the patient fallen in the past 6 months  Yes    How many times?  1    Has the patient had a decrease in activity level because of a fear of falling?   No    Is the patient reluctant to leave their home because of a fear of falling?   No      Home Environment   Living Environment  Private residence    Living Arrangements  Spouse/significant other    Type of [place] to enter    Entrance Stairs-Number of Steps  3    Entrance Stairs-Rails  None    Home Layout  Two level    Alternate Level Stairs-Number of Steps  19    Alternate Level Stairs-Rails  Left    Home Equipment  None      Prior Function   Level of Independence  Independent    Vocation  --   getting ready to retire   Leisure  play tennis, ride bike, Transport planner   Overall Cognitive Status  Within Functional Limits for tasks assessed      Observation/Other Assessments   Focus on Therapeutic Outcomes (FOTO)   Cervical: 33 (67% limited, 49% predicted)      Sensation   Light Touch  Appears Intact      Coordination   Gross Motor Movements are Fluid and Coordinated  No   limited by pain and guarding     Posture/Postural Control   Posture/Postural Control  Postural limitations    Postural Limitations  Rounded Shoulders;Forward head;Posterior pelvic tilt   severely rounded posture with head tilted down     AROM   AROM Assessment Site  Cervical    Cervical Flexion  40   mild pain   Cervical Extension  5   mild pain   Cervical - Right Side Bend  5   mild pain   Cervical - Left Side Bend  13   mild pain   Cervical - Right Rotation  15   mild pain   Cervical - Left Rotation  19   mild pain     Strength   Strength Assessment Site  Shoulder;Elbow;Wrist;Hand    Right/Left Shoulder  Right;Left    Right Shoulder Flexion  3+/5   pain in forearm   Right Shoulder ABduction  3+/5   pain in arm   Right Shoulder Internal Rotation  4/5  Right Shoulder External Rotation  3+/5   pain in posterior shoulder   Left Shoulder Flexion  4+/5   pain in forearm   Left Shoulder ABduction  4-/5    Left Shoulder Internal Rotation  4/5    Left Shoulder External Rotation  4/5    Right/Left Elbow  Right;Left    Right Elbow Flexion  4+/5    Right Elbow Extension  4+/5    Left Elbow Flexion  5/5    Left Elbow Extension  4+/5    Right/Left Wrist  Right;Left    Right Wrist Flexion  5/5    Right Wrist Extension  5/5    Left Wrist Flexion  5/5    Left Wrist Extension  5/5    Right/Left hand  Right;Left    Right Hand Grip (lbs)  42    Left Hand Grip (lbs)  30      Palpation   Palpation comment  TTP and increased tone in R suboccipitals, UT, LS, infraspinatus; increased tone in L UT, suboccipitals, LS                Objective measurements completed on examination: See above findings.      [facility] Adult PT Treatment/Exercise - 09/14/18 1033      Electrical Stimulation   Electrical Stimulation Location  R UT    Electrical Stimulation Action  IFC    Electrical Stimulation Parameters  outout: 17 to tolerance; 15 min    Electrical Stimulation Goals  Pain;Tone             PT Education - 09/14/18 1116    Education Details  prognosis, POC, HEP; advised to use moist heat at home for 10-60min for pain relief    Person(s) Educated  Patient    Methods  Explanation;Demonstration;Tactile cues;Verbal cues;Handout    Comprehension  Verbalized understanding;Returned demonstration       PT Short Term Goals - 09/14/18 1132      PT SHORT TERM GOAL #1   Title  Patient to be independent with initial HEP.    Time  4    Period  Weeks    Status  New    Target Date  10/12/18        PT Long Term Goals - 09/14/18 1132      PT LONG TERM GOAL #1   Title  Patient to be independent with advanced HEP.    Time  8    Period  Weeks    Status  New    Target Date  11/09/18      PT LONG TERM GOAL #2   Title  Patient to  demonstrate [facility] and pain-free cervical AROM.    Time  8    Period  Weeks    Status  New    Target Date  11/09/18      PT LONG TERM GOAL #3   Title  Patient to demonstrate >=4+/5 strength in B shoulders.    Time  8    Period  Weeks    Status  New    Target Date  11/09/18      PT LONG TERM GOAL #4   Title  Patient to report tolerance for 4 hours of supine positioning without pain limiting.     Time  8    Period  Weeks    Status  New       Target Date  11/09/18      PT LONG TERM GOAL #5  Title  Patient to report return to playing tennis without limitations or pain.    Time  8    Period  Weeks    Status  New    Target Date  11/09/18             Plan - 09/14/18 1117    Clinical Impression Statement  Patient is a 63y/o M with hx of C4-6 fusion presenting to OPPT with c/o acute onset of cervical pain after falling onto knees on concrete surface 6 weeks ago. Pain is located is the R posterior neck with intermittent radiation of pain & N/T to R or L dorsal forearms. Aggravating factors include cervical extension when serving in tennis, neck rotation, reaching overhead with R UE, and lifting weight. Patient today with severely limited and painful cervical AROM, decreased B shoulder strength, R>L, increased tone in B suboccipitals, UT, LS, infraspinatus, and severely rounded posture. Patient educated on gentle ROM and stretching HEP and advised not to push into pain. Patient reported understanding. Ended session with e-stim to R UT for pain relief- no complaints at end of session. Would benefit from skilled PT services 2x/week for 8 weeks to address aforementioned impairments.     Personal Factors and Comorbidities  Age;Past/Current Experience;Comorbidity 3+;Time since onset of injury/illness/exacerbation;Fitness    Comorbidities  OA, LBP, HLD, heart murmur, asthma, DM, CAD, anemia, ADD, achilles tenotomy, L heart cath & angio 06/2017, coronary stent 06/2017, C4-6 fusion     Examination-Activity Limitations  Reach Overhead;Bend;Caring for Others;Sleep;Dressing;Hygiene/Grooming;Lift    Examination-Participation Restrictions  Cleaning;Church;Community Activity;Shop;Driving;Yard Work;Laundry;Meal Prep    Stability/Clinical Decision Making  Evolving/Moderate complexity    Clinical Decision Making  Moderate    Rehab Potential  Good    PT Frequency  2x / week    PT Duration  8 weeks    PT Treatment/Interventions  ADLs/Self Care Home Management;Cryotherapy;Electrical Stimulation;Ultrasound;Moist Heat;Therapeutic activities;Therapeutic exercise;Neuromuscular re-education;Patient/family education;Passive range of motion;Manual techniques;Dry needling;Energy conservation;Taping    PT Next Visit Plan  reassess HEP    Consulted and Agree with Plan of Care  Patient       Patient will benefit from skilled therapeutic intervention in order to improve the following deficits and impairments:  Hypomobility, Decreased activity tolerance, Decreased strength, Impaired UE functional use, Pain, Increased muscle spasms, Decreased range of motion, Improper body mechanics, Postural dysfunction, Impaired flexibility  Visit Diagnosis: Cervicalgia  Other symptoms and signs involving the musculoskeletal system  Abnormal posture  Muscle weakness (generalized)     Problem List Patient Active Problem List   Diagnosis Date Noted  . Onychomycosis 05/25/2018  . Palpitations 07/06/2017  . CAD (coronary artery disease) 07/05/2017  . Family history of early CAD 07/05/2017  . Abnormal findings on diagnostic imaging of cardiovascular system 06/23/2017  . Low vitamin D level 04/22/2017  . Pain in right ankle and joints of right foot 10/05/2016  . Hemarthrosis of right elbow 02/03/2016  . Small thenar eminence 02/03/2016  . Cervical disc disorder with radiculopathy of cervical region 10/07/2015  . Incomplete rotator cuff tear 09/10/2015  . Nonischemic cardiomyopathy ([place]) 05/15/2015  .  Fatigue 05/15/2015  . Dyspnea on exertion 05/15/2015  . Decreased cardiac ejection fraction 04/02/2015  . Strain of latissimus dorsi muscle 01/21/2015  . Shoulder bursitis 10/12/2014  . Stenosis of lateral recess of lumbar spine 10/12/2014  . BPPV (benign paroxysmal positional vertigo) 08/14/2014  . Hyperlipidemia 01/19/2014  . Anemia, unspecified 01/19/2014  . ADD (attention deficit disorder) 01/19/2014  . Type 2 diabetes mellitus ([place])  12/22/2013  . Hypogonadism in male 12/22/2013  . Arthritis of hand, left 12/22/2013    Janene Harvey, PT, DPT 09/14/18 11:38 AM    [facility] [address]  [place], [place], [address] Phone: [phone]   Fax:  [phone]  Name: Reginald Rios MRN: [id number] Date of Birth: March 18, 1956

## 2018-09-14 NOTE — Telephone Encounter (Signed)
Needs appt and will need labs. Ty.

## 2018-09-14 NOTE — Progress Notes (Signed)
CC: F/u  Subjective: Patient is a 63 y.o. male here for f/u for low T. Due to COVID-19 pandemic, we are interacting via web portal for an electronic face-to-face visit. I verified patient's ID using 2 identifiers. Patient agreed to proceed with visit via this method. Patient is at home, I am at office. Patient and I are present for visit.   100 mg/d T/d. Was dx'd by former pcp in [place]. Felt afternoon fatigue prior to tx. Doing well with treatment. Has not had levels checked. CBC WNL in 05/2018. PSA needs to be checked also. Unsure if he had another workup done with initial dx'ing clinician.   ROS: Endo: As noted in HPI  Past Medical History:  Diagnosis Date  . ADD (attention deficit disorder)   . Anemia   . Arthritis   . CAD (coronary artery disease)    2/19 PCI/DESx1 to Lcx  . Diabetes mellitus without complication ([place])   . Exercise-induced asthma   . Heart murmur   . Hyperlipidemia    family hx of high cholesterol  . Hypogonadism in male   . Low back pain   . Osteoarthritis     Objective: No conversational dyspnea Age appropriate judgment and insight Nml affect and mood  Assessment and Plan: Hypogonadism in male - Plan: testosterone (ANDROGEL) 50 MG/5GM (1%) GEL, PSA, Testosterone, Free, Total, SHBG  As above. I want to see records.  AM labs. F/u as originally scheduled. The patient voiced understanding and agreement to the plan.  [place], DO 09/14/18  2:17 PM

## 2018-09-14 NOTE — Telephone Encounter (Signed)
Patient/pharmacy requesting refill on testosterone gel.  Has been a  While since lab checked.Marland Kitchenand this refilled.

## 2018-09-16 ENCOUNTER — Ambulatory Visit: Payer: 59

## 2018-09-16 ENCOUNTER — Other Ambulatory Visit (INDEPENDENT_AMBULATORY_CARE_PROVIDER_SITE_OTHER): Payer: 59

## 2018-09-16 ENCOUNTER — Other Ambulatory Visit: Payer: Self-pay | Admitting: Emergency Medicine

## 2018-09-16 ENCOUNTER — Other Ambulatory Visit: Payer: Self-pay

## 2018-09-16 DIAGNOSIS — M542 Cervicalgia: Secondary | ICD-10-CM | POA: Diagnosis not present

## 2018-09-16 DIAGNOSIS — R29898 Other symptoms and signs involving the musculoskeletal system: Secondary | ICD-10-CM | POA: Diagnosis not present

## 2018-09-16 DIAGNOSIS — M6281 Muscle weakness (generalized): Secondary | ICD-10-CM

## 2018-09-16 DIAGNOSIS — R293 Abnormal posture: Secondary | ICD-10-CM

## 2018-09-16 DIAGNOSIS — E291 Testicular hypofunction: Secondary | ICD-10-CM

## 2018-09-16 NOTE — Therapy (Signed)
[facility] [address]  [place], [place], [address] Phone: [phone]   Fax:  [phone]  Physical Therapy Treatment  Patient Details  Name: Reginald Rios MRN: [id number] Date of Birth: 1955-08-22 Referring Provider (PT): Kristeen Miss, MD   Encounter Date: 09/16/2018  PT End of Session - 09/16/18 0939    Visit Number  2    Number of Visits  17    Date for PT Re-Evaluation  11/09/18    Authorization Type  [facility]    PT Start Time  0932    PT Stop Time  1030    PT Time Calculation (min)  58 min    Activity Tolerance  Patient tolerated treatment well;Patient limited by pain    Behavior During Therapy  [facility] for tasks assessed/performed       Past Medical History:  Diagnosis Date  . ADD (attention deficit disorder)   . Anemia   . Arthritis   . CAD (coronary artery disease)    2/19 PCI/DESx1 to Lcx  . Diabetes mellitus without complication ([place])   . Exercise-induced asthma   . Heart murmur   . Hyperlipidemia    family hx of high cholesterol  . Hypogonadism in male   . Low back pain   . Osteoarthritis     Past Surgical History:  Procedure Laterality Date  . CORONARY STENT INTERVENTION N/A 06/23/2017   Procedure: CORONARY STENT INTERVENTION;  Surgeon: Leonie Man, MD;  Location: [facility];  Service: Cardiovascular;  Laterality: N/A;  . INTRAVASCULAR PRESSURE WIRE/FFR STUDY N/A 06/23/2017   Procedure: INTRAVASCULAR PRESSURE WIRE/FFR STUDY;  Surgeon: Leonie Man, MD;  Location: [facility];  Service: Cardiovascular;  Laterality: N/A;  . KNEE ARTHROSCOPY    . LEFT HEART CATH AND CORONARY ANGIOGRAPHY N/A 06/23/2017   Procedure: LEFT HEART CATH AND CORONARY ANGIOGRAPHY;  Surgeon: Leonie Man, MD;  Location: [facility];  Service: Cardiovascular;  Laterality: N/A;  . TENOTOMY ACHILLES TENDON      There were no vitals filed for this visit.  Subjective Assessment - 09/16/18  0938    Subjective  Pt. noting some increased pain with HEP performance.  Does feel he "needs" to do these exercises.  having some complaint of anterior headache today.      Pertinent History  OA, LBP, HLD, heart murmur, asthma, DM, CAD, anemia, ADD, achilles tenotomy, L heart cath & angio 06/2017, coronary stent 06/2017, C4-6 fusion     Diagnostic tests  09/04/18 cervical MRI: No acute osseous injury of the cervical spine. Anterior cervical fusion from C4-C6. Moderate B foraminal stenosis at C4-5. Likely mild myelomalacia C5-6. At C3-4 there is moderate left facet arthropathy and left uncovertebral degenerative changes. Severe L foraminal stenosis.     Patient Stated Goals  "I'd like the pain to stop"    Currently in Pain?  Yes    Pain Score  6     Pain Location  Neck    Pain Orientation  Right;Posterior    Pain Descriptors / Indicators  Aching    Pain Type  Chronic pain    Pain Radiating Towards  wrapping around to front of head     Multiple Pain Sites  No                       [facility] Adult PT Treatment/Exercise - 09/16/18 0001      Neck Exercises:  Seated   Neck Retraction  10 reps;3 secs    Neck Retraction Limitations  + scapular retraction     Other Seated Exercise  B cervical rotation with towel assistance 5" x 10 reps each side     Other Seated Exercise  Seated scapular retraction 5" x 10 reps       Lumbar Exercises: Aerobic   Nustep  Lvl 2, 6 min (UE/LE) - cues use UE       Moist Heat Therapy   Number Minutes Moist Heat  15 Minutes    Moist Heat Location  Cervical      Electrical Stimulation   Electrical Stimulation Location  B UT    Electrical Stimulation Action  IFC    Electrical Stimulation Parameters  to tolerance, 15'    Electrical Stimulation Goals  Pain;Tone      Manual Therapy   Manual Therapy  Soft tissue mobilization;Passive ROM    Manual therapy comments  supine     Soft tissue mobilization  STM to B UT, cervical paraspinals, suboccipitals     Passive ROM  Gentle UT, LS stretch manually with thepast x 20 sec each      Neck Exercises: Stretches   Upper Trapezius Stretch  Right;Left;2 reps;30 seconds    Upper Trapezius Stretch Limitations  B     Levator Stretch  Right;Left;2 reps;30 seconds    Levator Stretch Limitations  B                PT Short Term Goals - 09/16/18 0939      PT SHORT TERM GOAL #1   Title  Patient to be independent with initial HEP.    Time  4    Period  Weeks    Status  On-going    Target Date  10/12/18        PT Long Term Goals - 09/16/18 0939      PT LONG TERM GOAL #1   Title  Patient to be independent with advanced HEP.    Time  8    Period  Weeks    Status  On-going      PT LONG TERM GOAL #2   Title  Patient to demonstrate [facility] and pain-free cervical AROM.    Time  8    Period  Weeks    Status  On-going      PT LONG TERM GOAL #3   Title  Patient to demonstrate >=4+/5 strength in B shoulders.    Time  8    Period  Weeks    Status  On-going      PT LONG TERM GOAL #4   Title  Patient to report tolerance for 4 hours of supine positioning without pain limiting.     Time  8    Period  Weeks    Status  On-going         PT LONG TERM GOAL #5   Title  Patient to report return to playing tennis without limitations or pain.    Time  8    Period  Weeks    Status  On-going            Plan - 09/16/18 0948    Clinical Impression Statement  Reginald Rios reporting onset of headache after performing HEP.  Reviewed HEP to check for tolerance today with mild correction to avoid pushing to painful end range of motion occasionally.  Pt. tolerated HEP well today and did report mild headache, which  was relieved after MT addressing suboccipital release, STM to B cervical paraspinals, UT.  Pt. will likely benefit from further skilled MT and gentle postural strengthening activities in future visits.  Ended session with application of moist heat/E-stim to upper shoulder/cervical spine for reduction  in tone and pain.  Pt. with good overall relief however did complain of some R UT numbness after E-stim.  Will plan to address thus and adjust modalities as needed in coming session.      Personal Factors and Comorbidities  Age;Past/Current Experience;Comorbidity 3+;Time since onset of injury/illness/exacerbation;Fitness    Comorbidities  OA, LBP, HLD, heart murmur, asthma, DM, CAD, anemia, ADD, achilles tenotomy, L heart cath & angio 06/2017, coronary stent 06/2017, C4-6 fusion    Examination-Activity Limitations  Reach Overhead;Bend;Caring for Others;Sleep;Dressing;Hygiene/Grooming;Lift    Examination-Participation Restrictions  Cleaning;Church;Community Activity;Shop;Driving;Yard Work;Laundry;Meal Prep    PT Treatment/Interventions  ADLs/Self Care Home Management;Cryotherapy;Electrical Stimulation;Ultrasound;Moist Heat;Therapeutic activities;Therapeutic exercise;Neuromuscular re-education;Patient/family education;Passive range of motion;Manual techniques;Dry needling;Energy conservation;Taping    Consulted and Agree with Plan of Care  Patient       Patient will benefit from skilled therapeutic intervention in order to improve the following deficits and impairments:  Hypomobility, Decreased activity tolerance, Decreased strength, Impaired UE functional use, Pain, Increased muscle spasms, Decreased range of motion, Improper body mechanics, Postural dysfunction, Impaired flexibility  Visit Diagnosis: Cervicalgia  Other symptoms and signs involving the musculoskeletal system  Abnormal posture  Muscle weakness (generalized)     Problem List Patient Active Problem List   Diagnosis Date Noted  . Onychomycosis 05/25/2018  . Palpitations 07/06/2017  . CAD (coronary artery disease) 07/05/2017  . Family history of early CAD 07/05/2017  . Abnormal findings on diagnostic imaging of cardiovascular system 06/23/2017  . Low vitamin D level 04/22/2017  . Pain in right ankle and joints of right  foot 10/05/2016  . Hemarthrosis of right elbow 02/03/2016  . Small thenar eminence 02/03/2016  . Cervical disc disorder with radiculopathy of cervical region 10/07/2015  . Incomplete rotator cuff tear 09/10/2015  . Nonischemic cardiomyopathy ([place]) 05/15/2015  . Fatigue 05/15/2015  . Dyspnea on exertion 05/15/2015  . Decreased cardiac ejection fraction 04/02/2015  . Strain of latissimus dorsi muscle 01/21/2015  . Shoulder bursitis 10/12/2014  . Stenosis of lateral recess of lumbar spine 10/12/2014  . BPPV (benign paroxysmal positional vertigo) 08/14/2014  . Hyperlipidemia 01/19/2014  . Anemia, unspecified 01/19/2014  . ADD (attention deficit disorder) 01/19/2014  . Type 2 diabetes mellitus ([place]) 12/22/2013  . Hypogonadism in male 12/22/2013  . Arthritis of hand, left 12/22/2013    Bess Harvest, PTA 09/16/18 12:57 PM   [facility] [address]  [place], [place], [address] Phone: [phone]   Fax:  [phone]  Name: Reginald Rios MRN: [id number] Date of Birth: Jun 29, 1955

## 2018-09-16 NOTE — Addendum Note (Signed)
Addended by: Caffie Pinto on: 09/16/2018 01:59 PM   Modules accepted: Orders

## 2018-09-19 ENCOUNTER — Ambulatory Visit: Payer: 59

## 2018-09-19 ENCOUNTER — Other Ambulatory Visit: Payer: Self-pay

## 2018-09-19 DIAGNOSIS — R29898 Other symptoms and signs involving the musculoskeletal system: Secondary | ICD-10-CM

## 2018-09-19 DIAGNOSIS — R293 Abnormal posture: Secondary | ICD-10-CM

## 2018-09-19 DIAGNOSIS — M542 Cervicalgia: Secondary | ICD-10-CM

## 2018-09-19 DIAGNOSIS — M6281 Muscle weakness (generalized): Secondary | ICD-10-CM

## 2018-09-19 LAB — PSA: PSA: 1.3 ng/mL (ref <=4.0)

## 2018-09-19 LAB — TESTOS,TOTAL,FREE AND SHBG (FEMALE)

## 2018-09-19 MED FILL — GABAPENTIN 100 MG CAPSULE: 100 | 90 days supply | Qty: 90 | Fill #1

## 2018-09-19 NOTE — Therapy (Signed)
[facility] [address]  [place], [place], [address] Phone: [phone]   Fax:  [phone]  Physical Therapy Treatment  Patient Details  Name: Reginald Rios MRN: [id number] Date of Birth: [birth year] Referring Provider (PT): Kristeen Miss, MD   Encounter Date: 09/19/2018  PT End of Session - 09/19/18 0928    Visit Number  3    Number of Visits  17    Date for PT Re-Evaluation  11/09/18    Authorization Type  [facility]    PT Start Time  0903    PT Stop Time  1008    PT Time Calculation (min)  65 min    Activity Tolerance  Patient tolerated treatment well;Patient limited by pain    Behavior During Therapy  [facility] for tasks assessed/performed       Past Medical History:  Diagnosis Date  . ADD (attention deficit disorder)   . Anemia   . Arthritis   . CAD (coronary artery disease)    2/19 PCI/DESx1 to Lcx  . Diabetes mellitus without complication ([place])   . Exercise-induced asthma   . Heart murmur   . Hyperlipidemia    family hx of high cholesterol  . Hypogonadism in male   . Low back pain   . Osteoarthritis     Past Surgical History:  Procedure Laterality Date  . CORONARY STENT INTERVENTION N/A 06/23/2017   Procedure: CORONARY STENT INTERVENTION;  Surgeon: Leonie Man, MD;  Location: [facility];  Service: Cardiovascular;  Laterality: N/A;  . INTRAVASCULAR PRESSURE WIRE/FFR STUDY N/A 06/23/2017   Procedure: INTRAVASCULAR PRESSURE WIRE/FFR STUDY;  Surgeon: Leonie Man, MD;  Location: [facility];  Service: Cardiovascular;  Laterality: N/A;  . KNEE ARTHROSCOPY    . LEFT HEART CATH AND CORONARY ANGIOGRAPHY N/A 06/23/2017   Procedure: LEFT HEART CATH AND CORONARY ANGIOGRAPHY;  Surgeon: Leonie Man, MD;  Location: [facility];  Service: Cardiovascular;  Laterality: N/A;  . TENOTOMY ACHILLES TENDON      There were no vitals filed for this visit.  Subjective Assessment - 09/19/18  0916    Subjective  Felt improved comfort after last session.    Pertinent History  OA, LBP, HLD, heart murmur, asthma, DM, CAD, anemia, ADD, achilles tenotomy, L heart cath & angio 06/2017, coronary stent 06/2017, C4-6 fusion     Diagnostic tests  09/04/18 cervical MRI: No acute osseous injury of the cervical spine. Anterior cervical fusion from C4-C6. Moderate B foraminal stenosis at C4-5. Likely mild myelomalacia C5-6. At C3-4 there is moderate left facet arthropathy and left uncovertebral degenerative changes. Severe L foraminal stenosis.     Patient Stated Goals  "I'd like the pain to stop"    Currently in Pain?  Yes    Pain Score  7     Pain Location  Neck    Pain Orientation  Right;Posterior    Pain Radiating Towards  shooting up R-sided neck and had been feeling some R arm radiating pain earlier this morning    Pain Frequency  Intermittent    Multiple Pain Sites  No                       [facility] Adult PT Treatment/Exercise - 09/19/18 0001      Neck Exercises: Machines for Strengthening   UBE (Upper Arm Bike)  Lvl 1.0, 3 min forward/3 min backwards  Neck Exercises: Seated   Neck Retraction  15 reps;5 secs    Neck Retraction Limitations  + scapular retraction     Shoulder Rolls  Backwards;15 reps    Shoulder Rolls Limitations  Focusing on "big" movemennts     Other Seated Exercise  Seated scapular retraction 5" x 10 reps       Moist Heat Therapy   Number Minutes Moist Heat  15 Minutes    Moist Heat Location  Cervical      Electrical Stimulation   Electrical Stimulation Location  mid thoracic paraspinals     Electrical Stimulation Action  IC    Electrical Stimulation Parameters  to tolerance, 15'    Electrical Stimulation Goals  Pain;Tone      Manual Therapy   Manual Therapy  Soft tissue mobilization    Manual therapy comments  supine     Soft tissue mobilization  STM/DTM to B UT, rhomboids, mid UT, Levator scap., cervical paraspinals    Passive ROM  --       Neck Exercises: Stretches   Upper Trapezius Stretch  Right;Left;2 reps;30 seconds    Upper Trapezius Stretch Limitations  B     Levator Stretch  Right;Left;2 reps;30 seconds    Levator Stretch Limitations  B              PT Education - 09/19/18 1245    Education Details  HEP update; red TB issued to pt.     Person(s) Educated  Patient    Methods  Explanation;Demonstration;Verbal cues;Handout    Comprehension  Verbalized understanding;Returned demonstration       PT Short Term Goals - 09/16/18 0939      PT SHORT TERM GOAL #1   Title  Patient to be independent with initial HEP.    Time  4    Period  Weeks    Status  On-going    Target Date  10/12/18        PT Long Term Goals - 09/16/18 0939      PT LONG TERM GOAL #1   Title  Patient to be independent with advanced HEP.    Time  8    Period  Weeks    Status  On-going      PT LONG TERM GOAL #2   Title  Patient to demonstrate [facility] and pain-free cervical AROM.    Time  8    Period  Weeks    Status  On-going      PT LONG TERM GOAL #3   Title  Patient to demonstrate >=4+/5 strength in B shoulders.    Time  8    Period  Weeks    Status  On-going      PT LONG TERM GOAL #4   Title  Patient to report tolerance for 4 hours of supine positioning without pain limiting.     Time  8    Period  Weeks    Status  On-going         PT LONG TERM GOAL #5   Title  Patient to report return to playing tennis without limitations or pain.    Time  8    Period  Weeks    Status  On-going            Plan - 09/19/18 0927    Clinical Impression Statement  Jayvion reporting improved comfort after last session with some increased R UE numbness which subsided later day of session.  Tolerated  progression of scapular strengthening activities, postural corrective activities well today.  Noted significant improvement in B upper shoulder/neck comfort following STM/DTM to cervical and upper back musculature today.  Ended visit  with E-stim/moist heat applied to upper back/neck for reduction in tone and pain.  Pt. HEP updated and handout issued to pt. along with red TB for rows.  Will monitor tolerance for this in coming sessions.      Personal Factors and Comorbidities  Age;Past/Current Experience;Comorbidity 3+;Time since onset of injury/illness/exacerbation;Fitness    Comorbidities  OA, LBP, HLD, heart murmur, asthma, DM, CAD, anemia, ADD, achilles tenotomy, L heart cath & angio 06/2017, coronary stent 06/2017, C4-6 fusion    Examination-Activity Limitations  Reach Overhead;Bend;Caring for Others;Sleep;Dressing;Hygiene/Grooming;Lift    Examination-Participation Restrictions  Cleaning;Church;Community Activity;Shop;Driving;Yard Work;Laundry;Meal Prep    PT Treatment/Interventions  ADLs/Self Care Home Management;Cryotherapy;Electrical Stimulation;Ultrasound;Moist Heat;Therapeutic activities;Therapeutic exercise;Neuromuscular re-education;Patient/family education;Passive range of motion;Manual techniques;Dry needling;Energy conservation;Taping    PT Next Visit Plan  postural/shoulder strengthening activities; MT to address cervical, upper shoulder musculature; posture and body mechanics instruction     Consulted and Agree with Plan of Care  Patient       Patient will benefit from skilled therapeutic intervention in order to improve the following deficits and impairments:  Hypomobility, Decreased activity tolerance, Decreased strength, Impaired UE functional use, Pain, Increased muscle spasms, Decreased range of motion, Improper body mechanics, Postural dysfunction, Impaired flexibility  Visit Diagnosis: Cervicalgia  Other symptoms and signs involving the musculoskeletal system  Abnormal posture  Muscle weakness (generalized)     Problem List Patient Active Problem List   Diagnosis Date Noted  . Onychomycosis 05/25/2018  . Palpitations 07/06/2017  . CAD (coronary artery disease) 07/05/2017  . Family history of  early CAD 07/05/2017  . Abnormal findings on diagnostic imaging of cardiovascular system 06/23/2017  . Low vitamin D level 04/22/2017  . Pain in right ankle and joints of right foot 10/05/2016  . Hemarthrosis of right elbow 02/03/2016  . Small thenar eminence 02/03/2016  . Cervical disc disorder with radiculopathy of cervical region 10/07/2015  . Incomplete rotator cuff tear 09/10/2015  . Nonischemic cardiomyopathy ([place]) 05/15/2015  . Fatigue 05/15/2015  . Dyspnea on exertion 05/15/2015  . Decreased cardiac ejection fraction 04/02/2015  . Strain of latissimus dorsi muscle 01/21/2015  . Shoulder bursitis 10/12/2014  . Stenosis of lateral recess of lumbar spine 10/12/2014  . BPPV (benign paroxysmal positional vertigo) 08/14/2014  . Hyperlipidemia 01/19/2014  . Anemia, unspecified 01/19/2014  . ADD (attention deficit disorder) 01/19/2014  . Type 2 diabetes mellitus ([place]) 12/22/2013  . Hypogonadism in male 12/22/2013  . Arthritis of hand, left 12/22/2013    Bess Harvest, PTA 09/19/18 2:07 PM   [facility] [address]  [place], [place], [address] Phone: [phone]   Fax:  [phone]  Name: JERON GRAHN MRN: [id number] Date of Birth: 01/31/1956

## 2018-09-21 ENCOUNTER — Other Ambulatory Visit: Payer: Self-pay | Admitting: Family Medicine

## 2018-09-21 DIAGNOSIS — E291 Testicular hypofunction: Secondary | ICD-10-CM

## 2018-09-22 ENCOUNTER — Other Ambulatory Visit: Payer: Self-pay

## 2018-09-22 ENCOUNTER — Ambulatory Visit: Payer: 59 | Admitting: Physical Therapy

## 2018-09-22 ENCOUNTER — Encounter: Payer: Self-pay | Admitting: Physical Therapy

## 2018-09-22 DIAGNOSIS — R29898 Other symptoms and signs involving the musculoskeletal system: Secondary | ICD-10-CM | POA: Diagnosis not present

## 2018-09-22 DIAGNOSIS — R293 Abnormal posture: Secondary | ICD-10-CM | POA: Diagnosis not present

## 2018-09-22 DIAGNOSIS — M542 Cervicalgia: Secondary | ICD-10-CM

## 2018-09-22 DIAGNOSIS — M6281 Muscle weakness (generalized): Secondary | ICD-10-CM | POA: Diagnosis not present

## 2018-09-22 NOTE — Therapy (Signed)
[facility] [address]  [place], [place], [address] Phone: [phone]   Fax:  [phone]  Physical Therapy Treatment  Patient Details  Name: Reginald Rios MRN: [id number] Date of Birth: May 20, 1955 Referring Provider (PT): Kristeen Miss, MD   Encounter Date: 09/22/2018  PT End of Session - 09/22/18 0958    Visit Number  4    Number of Visits  17    Date for PT Re-Evaluation  11/09/18    Authorization Type  [facility]    PT Start Time  0905    PT Stop Time  1002    PT Time Calculation (min)  57 min    Activity Tolerance  Patient tolerated treatment well;Patient limited by pain    Behavior During Therapy  [facility] for tasks assessed/performed       Past Medical History:  Diagnosis Date  . ADD (attention deficit disorder)   . Anemia   . Arthritis   . CAD (coronary artery disease)    2/19 PCI/DESx1 to Lcx  . Diabetes mellitus without complication ([place])   . Exercise-induced asthma   . Heart murmur   . Hyperlipidemia    family hx of high cholesterol  . Hypogonadism in male   . Low back pain   . Osteoarthritis     Past Surgical History:  Procedure Laterality Date  . CORONARY STENT INTERVENTION N/A 06/23/2017   Procedure: CORONARY STENT INTERVENTION;  Surgeon: Leonie Man, MD;  Location: [place] [facility];  Service: Cardiovascular;  Laterality: N/A;  . INTRAVASCULAR PRESSURE WIRE/FFR STUDY N/A 06/23/2017   Procedure: INTRAVASCULAR PRESSURE WIRE/FFR STUDY;  Surgeon: Leonie Man, MD;  Location: [place] [facility];  Service: Cardiovascular;  Laterality: N/A;  . KNEE ARTHROSCOPY    . LEFT HEART CATH AND CORONARY ANGIOGRAPHY N/A 06/23/2017   Procedure: LEFT HEART CATH AND CORONARY ANGIOGRAPHY;  Surgeon: Leonie Man, MD;  Location: [place] [facility];  Service: Cardiovascular;  Laterality: N/A;  . TENOTOMY ACHILLES TENDON      There were no vitals filed for this visit.  Subjective Assessment - 09/22/18  0904    Subjective  Reports improvement in neck pain but remaining R shoulder pain. Notes he was working in his garage and "paid the price" Notes sharp pain in R shoulder and down ulnar side of hand while sitting in pain.     Pertinent History  OA, LBP, HLD, heart murmur, asthma, DM, CAD, anemia, ADD, achilles tenotomy, L heart cath & angio 06/2017, coronary stent 06/2017, C4-6 fusion     Diagnostic tests  09/04/18 cervical MRI: No acute osseous injury of the cervical spine. Anterior cervical fusion from C4-C6. Moderate B foraminal stenosis at C4-5. Likely mild myelomalacia C5-6. At C3-4 there is moderate left facet arthropathy and left uncovertebral degenerative changes. Severe L foraminal stenosis.     Patient Stated Goals  "I'd like the pain to stop"    Currently in Pain?  Yes    Pain Score  2     Pain Location  Neck    Pain Orientation  Right    Pain Descriptors / Indicators  Aching;Dull    Pain Type  Chronic pain    Multiple Pain Sites  Yes    Pain Score  7    Pain Location  Shoulder    Pain Orientation  Right    Pain Descriptors / Indicators  Sharp    Pain Type  Acute pain  [facility] Adult PT Treatment/Exercise - 09/22/18 0001      Exercises   Exercises  Neck;Shoulder      Neck Exercises: Machines for Strengthening   UBE (Upper Arm Bike)  Lvl 1.0, 3 min forward/3 min backwards       Neck Exercises: Seated   Neck Retraction  1 rep;10 reps    Other Seated Exercise  R & L cervical rotation SNAG with pillowcase 10x3" each   to tolerance   Other Seated Exercise  cervical extension SNAG 10x to tolerance      Shoulder Exercises: Standing   Row  Strengthening;Both;10 reps;Theraband    Theraband Level (Shoulder Row)  Level 1 (Yellow)    Row Limitations  cues for scapular retraction + depression      Shoulder Exercises: IT sales professional  1 rep;30 seconds    Corner Stretch Limitations  90/90 pec doorway stretch to tolerance   delayed onset  of B hand N/T     Acupuncturist Location  R shoulder    Electrical Stimulation Action  IFC    Electrical Stimulation Parameters  output 14 to tolerance; 15 min    Electrical Stimulation Goals  Pain;Tone      Manual Therapy   Manual Therapy  Soft tissue mobilization    Manual therapy comments  sitting    Soft tissue mobilization  STM to R ant/lat/pos deltoid, pec, biceps, infraspinatus- most tenderness in pec, posterior deltoid, and infraspinatus             PT Education - 09/22/18 0949    Education Details  update to HEP; administered yellow TB    Person(s) Educated  Patient    Methods  Explanation;Demonstration;Tactile cues;Verbal cues;Handout    Comprehension  Verbalized understanding;Returned demonstration       PT Short Term Goals - 09/16/18 0939      PT SHORT TERM GOAL #1   Title  Patient to be independent with initial HEP.    Time  4    Period  Weeks    Status  On-going    Target Date  10/12/18        PT Long Term Goals - 09/16/18 0939      PT LONG TERM GOAL #1   Title  Patient to be independent with advanced HEP.    Time  8    Period  Weeks    Status  On-going      PT LONG TERM GOAL #2   Title  Patient to demonstrate [facility] and pain-free cervical AROM.    Time  8    Period  Weeks    Status  On-going      PT LONG TERM GOAL #3   Title  Patient to demonstrate >=4+/5 strength in B shoulders.    Time  8    Period  Weeks    Status  On-going      PT LONG TERM GOAL #4   Title  Patient to report tolerance for 4 hours of supine positioning without pain limiting.     Time  8    Period  Weeks    Status  On-going         PT LONG TERM GOAL #5   Title  Patient to report return to playing tennis without limitations or pain.    Time  8    Period  Weeks    Status  On-going  Plan - 09/22/18 1057    Clinical Impression Statement  Patient arrived to session with report of improvement in neck pain but  persisting R shoulder pain radiating to R ulnar side of hand. Tolerated STM to R shoulder with most tenderness in pec, posterior deltoid, and infraspinatus. Able to perform cervical rotation SNAG with cues required to correct positioning, and tolerated introduction of cervical extension SNAG today. Patient with c/o R hand numbness over ulnar nerve distribution and L hand numbness over median nerve distribution after performing standing pec stretch in doorway. Patient reporting that these sensations are not new, has previously had numbness in these areas. Good performance of rows with light banded resistance today. Ended session with e-stim to R shoulder for pain relief. No further complaints at end of session. Updated HEP with exercises that were well-tolerated today. Patient reported understanding.     Comorbidities  OA, LBP, HLD, heart murmur, asthma, DM, CAD, anemia, ADD, achilles tenotomy, L heart cath & angio 06/2017, coronary stent 06/2017, C4-6 fusion    PT Treatment/Interventions  ADLs/Self Care Home Management;Cryotherapy;Electrical Stimulation;Ultrasound;Moist Heat;Therapeutic activities;Therapeutic exercise;Neuromuscular re-education;Patient/family education;Passive range of motion;Manual techniques;Dry needling;Energy conservation;Taping    PT Next Visit Plan  postural/shoulder strengthening activities; MT to address cervical, upper shoulder musculature; posture and body mechanics instruction     Consulted and Agree with Plan of Care  Patient       Patient will benefit from skilled therapeutic intervention in order to improve the following deficits and impairments:  Hypomobility, Decreased activity tolerance, Decreased strength, Impaired UE functional use, Pain, Increased muscle spasms, Decreased range of motion, Improper body mechanics, Postural dysfunction, Impaired flexibility  Visit Diagnosis: Cervicalgia  Other symptoms and signs involving the musculoskeletal system  Abnormal  posture  Muscle weakness (generalized)     Problem List Patient Active Problem List   Diagnosis Date Noted  . Onychomycosis 05/25/2018  . Palpitations 07/06/2017  . CAD (coronary artery disease) 07/05/2017  . Family history of early CAD 07/05/2017  . Abnormal findings on diagnostic imaging of cardiovascular system 06/23/2017  . Low vitamin D level 04/22/2017  . Pain in right ankle and joints of right foot 10/05/2016  . Hemarthrosis of right elbow 02/03/2016  . Small thenar eminence 02/03/2016  . Cervical disc disorder with radiculopathy of cervical region 10/07/2015  . Incomplete rotator cuff tear 09/10/2015  . Nonischemic cardiomyopathy ([place]) 05/15/2015  . Fatigue 05/15/2015  . Dyspnea on exertion 05/15/2015  . Decreased cardiac ejection fraction 04/02/2015  . Strain of latissimus dorsi muscle 01/21/2015  . Shoulder bursitis 10/12/2014  . Stenosis of lateral recess of lumbar spine 10/12/2014  . BPPV (benign paroxysmal positional vertigo) 08/14/2014  . Hyperlipidemia 01/19/2014  . Anemia, unspecified 01/19/2014  . ADD (attention deficit disorder) 01/19/2014  . Type 2 diabetes mellitus ([place]) 12/22/2013  . Hypogonadism in male 12/22/2013  . Arthritis of hand, left 12/22/2013     Janene Harvey, PT, DPT 09/22/18 10:59 AM   [facility] [address]  [place], [place], [address] Phone: [phone]   Fax:  [phone]  Name: GATSBY CHISMAR MRN: [id number] Date of Birth: August 13, 1955

## 2018-09-23 ENCOUNTER — Other Ambulatory Visit: Payer: 59

## 2018-09-23 DIAGNOSIS — E291 Testicular hypofunction: Secondary | ICD-10-CM | POA: Diagnosis not present

## 2018-09-28 ENCOUNTER — Ambulatory Visit: Payer: 59

## 2018-09-28 ENCOUNTER — Other Ambulatory Visit: Payer: Self-pay

## 2018-09-28 DIAGNOSIS — M542 Cervicalgia: Secondary | ICD-10-CM

## 2018-09-28 DIAGNOSIS — R29898 Other symptoms and signs involving the musculoskeletal system: Secondary | ICD-10-CM

## 2018-09-28 DIAGNOSIS — R293 Abnormal posture: Secondary | ICD-10-CM | POA: Diagnosis not present

## 2018-09-28 DIAGNOSIS — M6281 Muscle weakness (generalized): Secondary | ICD-10-CM

## 2018-09-28 NOTE — Therapy (Addendum)
[facility] [address]  [place], [place], [address] Phone: [phone]   Fax:  [phone]  Physical Therapy Treatment  Patient Details  Name: Reginald Rios MRN: [id number] Date of Birth: Oct 26, 1955 Referring Provider (PT): Kristeen Miss, MD   Encounter Date: 09/28/2018  PT End of Session - 09/28/18 0914    Visit Number  5    Number of Visits  17    Date for PT Re-Evaluation  11/09/18    Authorization Type  [facility]    PT Start Time  0900    PT Stop Time  1015    PT Time Calculation (min)  75 min    Activity Tolerance  Patient tolerated treatment well;Patient limited by pain    Behavior During Therapy  [facility] for tasks assessed/performed       Past Medical History:  Diagnosis Date  . ADD (attention deficit disorder)   . Anemia   . Arthritis   . CAD (coronary artery disease)    2/19 PCI/DESx1 to Lcx  . Diabetes mellitus without complication ([place])   . Exercise-induced asthma   . Heart murmur   . Hyperlipidemia    family hx of high cholesterol  . Hypogonadism in male   . Low back pain   . Osteoarthritis     Past Surgical History:  Procedure Laterality Date  . CORONARY STENT INTERVENTION N/A 06/23/2017   Procedure: CORONARY STENT INTERVENTION;  Surgeon: Leonie Man, MD;  Location: [place] [facility];  Service: Cardiovascular;  Laterality: N/A;  . INTRAVASCULAR PRESSURE WIRE/FFR STUDY N/A 06/23/2017   Procedure: INTRAVASCULAR PRESSURE WIRE/FFR STUDY;  Surgeon: Leonie Man, MD;  Location: [place] [facility];  Service: Cardiovascular;  Laterality: N/A;  . KNEE ARTHROSCOPY    . LEFT HEART CATH AND CORONARY ANGIOGRAPHY N/A 06/23/2017   Procedure: LEFT HEART CATH AND CORONARY ANGIOGRAPHY;  Surgeon: Leonie Man, MD;  Location: [facility];  Service: Cardiovascular;  Laterality: N/A;  . TENOTOMY ACHILLES TENDON      There were no vitals filed for this visit.  Subjective Assessment - 09/28/18  0904    Subjective  Pt. reporting he dropped yard cutters due to R hand weakness a few days ago not sure why.      Pertinent History  OA, LBP, HLD, heart murmur, asthma, DM, CAD, anemia, ADD, achilles tenotomy, L heart cath & angio 06/2017, coronary stent 06/2017, C4-6 fusion     Diagnostic tests  09/04/18 cervical MRI: No acute osseous injury of the cervical spine. Anterior cervical fusion from C4-C6. Moderate B foraminal stenosis at C4-5. Likely mild myelomalacia C5-6. At C3-4 there is moderate left facet arthropathy and left uncovertebral degenerative changes. Severe L foraminal stenosis.     Patient Stated Goals  "I'd like the pain to stop"    Currently in Pain?  Yes    Pain Score  4     Pain Location  Neck    Pain Orientation  Right    Pain Descriptors / Indicators  Aching;Dull    Pain Type  Chronic pain    Multiple Pain Sites  Yes    Pain Score  7    Pain Location  Shoulder    Pain Orientation  Right    Pain Descriptors / Indicators  Sharp    Pain Type  Acute pain         [facility] PT Assessment - 09/28/18 0001  AROM   AROM Assessment Site  Cervical    Cervical Flexion  48    Cervical Extension  28    Cervical - Right Side Bend  18    Cervical - Left Side Bend  28    Cervical - Right Rotation  35    Cervical - Left Rotation  39                   [facility] Adult PT Treatment/Exercise - 09/28/18 0001      Neck Exercises: Machines for Strengthening   UBE (Upper Arm Bike)  Lvl 1.0, 3 min forward/3 min backwards       Neck Exercises: Theraband   Scapula Retraction  15 reps    Scapula Retraction Limitations  5" hold - therapist guided     Shoulder External Rotation  10 reps    Shoulder External Rotation Limitations  yellow TB seated in chair with pool noodle     Horizontal ABduction  15 reps    Horizontal ABduction Limitations  yellow TB pulling across belt line seated in chair with pool noodle       Neck Exercises: Seated   Shoulder Rolls  Backwards;15 reps     Shoulder Rolls Limitations  Focusing on "big" movemennts     Other Seated Exercise  scapular retractions 3" x 15 reps   Manually guided by therapist     Moist Heat Therapy   Number Minutes Moist Heat  15 Minutes    Moist Heat Location  Cervical   R UT     Electrical Stimulation   Electrical Stimulation Location  R UT/LS    Electrical Stimulation Action  IFC    Electrical Stimulation Parameters  to tolerance, 15'    Electrical Stimulation Goals  Pain;Tone      Manual Therapy   Manual Therapy  Soft tissue mobilization;Myofascial release;Passive ROM    Manual therapy comments  supine and seated     Soft tissue mobilization  STM/DTM to R UT/LS, R-sided cervical paraspinals     Myofascial Release  R LS pin-and-stretch, R LS, UT TPR; improved comfort noted following     Passive ROM  Gentle B UT, LS cervical extension stretch with slight cerv traction x 30 sec each way       Neck Exercises: Stretches   Upper Trapezius Stretch  Right;Left;30 seconds;3 reps   Manually guided by therapist   Upper Trapezius Stretch Limitations  B     Levator Stretch  Right;Left;30 seconds;3 reps   Manually guided by therapist   Levator Stretch Limitations  B     Corner Stretch  2 reps;30 seconds    Corner Stretch Limitations  low in doorway as limited tolerance for mid [employer]              PT Education - 09/28/18 1055    Education Details  HEP update    Person(s) Educated  Patient    Methods  Explanation;Demonstration;Verbal cues;Handout    Comprehension  Verbalized understanding;Returned demonstration;Verbal cues required;Need further instruction       PT Short Term Goals - 09/28/18 0915      PT SHORT TERM GOAL #1   Title  Patient to be independent with initial HEP.    Time  4    Period  Weeks    Status  Achieved    Target Date  10/12/18        PT Long Term Goals - 09/28/18 5366  PT LONG TERM GOAL #1   Title  Patient to be independent with advanced HEP.    Time  8     Period  Weeks    Status  On-going      PT LONG TERM GOAL #2   Title  Patient to demonstrate [facility] and pain-free cervical AROM.    Time  8    Period  Weeks    Status  Partially Met   09/28/18: demonstrating significant improvement in all cervical AROM; partially achieved for cervical flexion pain free and 48 dg AROM     PT LONG TERM GOAL #3   Title  Patient to demonstrate >=4+/5 strength in B shoulders.    Time  8    Period  Weeks    Status  On-going      PT LONG TERM GOAL #4   Title  Patient to report tolerance for 4 hours of supine positioning without pain limiting.     Time  8    Period  Weeks    Status  On-going         PT LONG TERM GOAL #5   Title  Patient to report return to playing tennis without limitations or pain.    Time  8    Period  Weeks    Status  On-going            Plan - 09/28/18 1012    Clinical Impression Statement  [place] reporting he has been doing some wood working on and off over the last three days and notes neck and R shoulder stiffness today.  Responded well to MT focused on improving tissue quality in R UT, LS areas and able to demo significant improvement in all cervical AROM today (see flowsheet).  Duration of session focused on postural strengthening activities with occasional tactile cueing required from therapist for proper positioning.  Ended session today with E-stim/moist heat to B upper shoulders/cervical spine for reduction in tone and pain with good overall relief noted following this.  May consider trial of KT taping at next session for postural correction per pt.  HEP updated today.  Will monitor tolerance at upcoming session.  Pt. progressing well toward established goals.      Personal Factors and Comorbidities  Age;Past/Current Experience;Comorbidity 3+;Time since onset of injury/illness/exacerbation;Fitness    Comorbidities  OA, LBP, HLD, heart murmur, asthma, DM, CAD, anemia, ADD, achilles tenotomy, L heart cath & angio 06/2017, coronary  stent 06/2017, C4-6 fusion    Examination-Activity Limitations  Reach Overhead;Bend;Caring for Others;Sleep;Dressing;Hygiene/Grooming;Lift    Examination-Participation Restrictions  Cleaning;Church;Community Activity;Shop;Driving;Yard Work;Laundry;Meal Prep    Rehab Potential  Good    PT Treatment/Interventions  ADLs/Self Care Home Management;Cryotherapy;Electrical Stimulation;Ultrasound;Moist Heat;Therapeutic activities;Therapeutic exercise;Neuromuscular re-education;Patient/family education;Passive range of motion;Manual techniques;Dry needling;Energy conservation;Taping    PT Next Visit Plan  postural/shoulder strengthening activities; MT to address cervical, upper shoulder musculature; posture and body mechanics instruction     Consulted and Agree with Plan of Care  Patient       Patient will benefit from skilled therapeutic intervention in order to improve the following deficits and impairments:  Hypomobility, Decreased activity tolerance, Decreased strength, Impaired UE functional use, Pain, Increased muscle spasms, Decreased range of motion, Improper body mechanics, Postural dysfunction, Impaired flexibility  Visit Diagnosis: Cervicalgia  Other symptoms and signs involving the musculoskeletal system  Abnormal posture  Muscle weakness (generalized)     Problem List Patient Active Problem List   Diagnosis Date Noted  . Onychomycosis 05/25/2018  .  Palpitations 07/06/2017  . CAD (coronary artery disease) 07/05/2017  . Family history of early CAD 07/05/2017  . Abnormal findings on diagnostic imaging of cardiovascular system 06/23/2017  . Low vitamin D level 04/22/2017  . Pain in right ankle and joints of right foot 10/05/2016  . Hemarthrosis of right elbow 02/03/2016  . Small thenar eminence 02/03/2016  . Cervical disc disorder with radiculopathy of cervical region 10/07/2015  . Incomplete rotator cuff tear 09/10/2015  . Nonischemic cardiomyopathy ([place]) 05/15/2015  . Fatigue  05/15/2015  . Dyspnea on exertion 05/15/2015  . Decreased cardiac ejection fraction 04/02/2015  . Strain of latissimus dorsi muscle 01/21/2015  . Shoulder bursitis 10/12/2014  . Stenosis of lateral recess of lumbar spine 10/12/2014  . BPPV (benign paroxysmal positional vertigo) 08/14/2014  . Hyperlipidemia 01/19/2014  . Anemia, unspecified 01/19/2014  . ADD (attention deficit disorder) 01/19/2014  . Type 2 diabetes mellitus ([place]) 12/22/2013  . Hypogonadism in male 12/22/2013  . Arthritis of hand, left 12/22/2013    Bess Harvest, PTA 09/28/18 10:56 AM    [facility] [address]  [place], [place], [address] Phone: [phone]   Fax:  [phone]  Name: HARSHAL SIRMON MRN: [id number] Date of Birth: 12-Mar-1956

## 2018-09-29 LAB — TESTOS,TOTAL,FREE AND SHBG (FEMALE)
Free Testosterone: 65.9 pg/mL (ref 35.0–155.0)
Sex Hormone Binding: 48 nmol/L (ref 22–77)
Testosterone, Total, LC-MS-MS: 511 ng/dL (ref 250–1100)

## 2018-09-30 ENCOUNTER — Ambulatory Visit: Payer: 59

## 2018-09-30 ENCOUNTER — Other Ambulatory Visit: Payer: Self-pay

## 2018-09-30 DIAGNOSIS — M6281 Muscle weakness (generalized): Secondary | ICD-10-CM

## 2018-09-30 DIAGNOSIS — R293 Abnormal posture: Secondary | ICD-10-CM | POA: Diagnosis not present

## 2018-09-30 DIAGNOSIS — M542 Cervicalgia: Secondary | ICD-10-CM

## 2018-09-30 DIAGNOSIS — R29898 Other symptoms and signs involving the musculoskeletal system: Secondary | ICD-10-CM

## 2018-09-30 NOTE — Therapy (Signed)
Reginald Rios [address]  [place], [place], [address] Phone: [phone]   Fax:  [phone]  Physical Therapy Treatment  Patient Details  Name: Reginald Rios MRN: [id number] Date of Birth: 08-24-1955 Referring Provider (PT): Kristeen Miss, MD   Encounter Date: 09/30/2018  PT End of Session - 09/30/18 0809    Visit Number  6    Number of Visits  17    Date for PT Re-Evaluation  11/09/18    Authorization Type  [facility]    PT Start Time  0801    PT Stop Time  0850    PT Time Calculation (min)  49 min    Activity Tolerance  Patient tolerated treatment well;Patient limited by pain    Behavior During Therapy  [facility] for tasks assessed/performed       Past Medical History:  Diagnosis Date  . ADD (attention deficit disorder)   . Anemia   . Arthritis   . CAD (coronary artery disease)    2/19 PCI/DESx1 to Lcx  . Diabetes mellitus without complication ([place])   . Exercise-induced asthma   . Heart murmur   . Hyperlipidemia    family hx of high cholesterol  . Hypogonadism in male   . Low back pain   . Osteoarthritis     Past Surgical History:  Procedure Laterality Date  . CORONARY STENT INTERVENTION N/A 06/23/2017   Procedure: CORONARY STENT INTERVENTION;  Surgeon: Leonie Man, MD;  Location: [place] [facility];  Service: Cardiovascular;  Laterality: N/A;  . INTRAVASCULAR PRESSURE WIRE/FFR STUDY N/A 06/23/2017   Procedure: INTRAVASCULAR PRESSURE WIRE/FFR STUDY;  Surgeon: Leonie Man, MD;  Location: [place] [facility];  Service: Cardiovascular;  Laterality: N/A;  . KNEE ARTHROSCOPY    . LEFT HEART CATH AND CORONARY ANGIOGRAPHY N/A 06/23/2017   Procedure: LEFT HEART CATH AND CORONARY ANGIOGRAPHY;  Surgeon: Leonie Man, MD;  Location: [place] [facility];  Service: Cardiovascular;  Laterality: N/A;  . TENOTOMY ACHILLES TENDON      There were no vitals filed for this visit.  Subjective Assessment - 09/30/18  0805    Subjective  Pt. noting he, "washed house with hose yesterday".  Is sore today with increased R lateral shoulder pain.      Pertinent History  OA, LBP, HLD, heart murmur, asthma, DM, CAD, anemia, ADD, achilles tenotomy, L heart cath & angio 06/2017, coronary stent 06/2017, C4-6 fusion     Diagnostic tests  09/04/18 cervical MRI: No acute osseous injury of the cervical spine. Anterior cervical fusion from C4-C6. Moderate B foraminal stenosis at C4-5. Likely mild myelomalacia C5-6. At C3-4 there is moderate left facet arthropathy and left uncovertebral degenerative changes. Severe L foraminal stenosis.     Patient Stated Goals  "I'd like the pain to stop"    Currently in Pain?  Yes    Pain Score  2     Pain Location  Neck    Pain Orientation  Right;Upper    Pain Descriptors / Indicators  Aching;Dull    Pain Radiating Towards  Aching pain into R lateral shoulder; pt. noting numbness into R posterior hand and 4th, 5th digit    Pain Onset  More than a month ago    Aggravating Factors   washing house with hose, and washing two cars yesterday    Pain Relieving Factors  stretches    Multiple Pain Sites  No  [facility] PT Treatment/Exercise - 09/30/18 0001      Neck Exercises: Machines for Strengthening   UBE (Upper Arm Bike)  Lvl 1.0, 3 min forward/3 min backwards       Neck Exercises: Standing   Other Standing Exercises  Ulnar nerve glides x 10 rpes       Neck Exercises: Seated   Money  15 reps;3 secs    Money Limitations  Cues for full scapular retraction     Shoulder Rolls  Backwards;15 reps    Shoulder Rolls Limitations  Focusing on "big" movemennts       Manual Therapy   Manual Therapy  Soft tissue mobilization;Myofascial release;Passive ROM;Taping    Manual therapy comments  supine and seated     Soft tissue mobilization  STM/DTM to R UT/LS, R-sided cervical paraspinals     Myofascial Release  R LS pin-and-stretch, R LS, UT TPR    Passive  ROM  Gentle B UT, LS (with prolonged holds as to ease muscular guarding), cervical extension stretch with slight cerv traction x 30 sec each way     Kinesiotex  Create Space      Kinesiotix   Create Space  scapular retraction taping to improved postural awareness (two horizontal I-strips from R>L scapulae (30% stretch); power strip (50% stretch along R UT (30% stretch)      Neck Exercises: Stretches   Upper Trapezius Stretch  Right;Left;30 seconds;2 reps    Upper Trapezius Stretch Limitations  B     Levator Stretch  Right;Left;30 seconds;2 reps    Levator Stretch Limitations  B     Corner Stretch  2 reps;30 seconds    Corner Stretch Limitations  low in doorway as limited tolerance for mid doorway strech                PT Short Term Goals - 09/28/18 0915      PT SHORT TERM GOAL #1   Title  Patient to be independent with initial HEP.    Time  4    Period  Weeks    Status  Achieved    Target Date  10/12/18        PT Long Term Goals - 09/28/18 0939      PT LONG TERM GOAL #1   Title  Patient to be independent with advanced HEP.    Time  8    Period  Weeks    Status  On-going      PT LONG TERM GOAL #2   Title  Patient to demonstrate [facility] and pain-free cervical AROM.    Time  8    Period  Weeks    Status  Partially Met   09/28/18: demonstrating significant improvement in all cervical AROM; partially achieved for cervical flexion pain free and 48 dg AROM     PT LONG TERM GOAL #3   Title  Patient to demonstrate >=4+/5 strength in B shoulders.    Time  8    Period  Weeks    Status  On-going      PT LONG TERM GOAL #4   Title  Patient to report tolerance for 4 hours of supine positioning without pain limiting.     Time  8    Period  Weeks    Status  On-going         PT LONG TERM GOAL #5   Title  Patient to report return to playing tennis without limitations or pain.  Time  8    Period  Weeks    Status  On-going            Plan - 09/30/18 0811     Clinical Impression Statement  Arel noting continued benefit from manual therapy in session reporting improved cervical mobility and comfort since starting therapy.  Did have increased R upper shoulder pain and R UE pain after "washing house with hose" for hours yesterday.  Therex focused on improving postural musculature and ended session with trial of K-taping to promote improved scapular positioning and promote upright posture.  Will monitor response in coming sessions.      Personal Factors and Comorbidities  Age;Past/Current Experience;Comorbidity 3+;Time since onset of injury/illness/exacerbation;Fitness    Comorbidities  OA, LBP, HLD, heart murmur, asthma, DM, CAD, anemia, ADD, achilles tenotomy, L heart cath & angio 06/2017, coronary stent 06/2017, C4-6 fusion    Examination-Activity Limitations  Reach Overhead;Bend;Caring for Others;Sleep;Dressing;Hygiene/Grooming;Lift    Examination-Participation Restrictions  Cleaning;Church;Community Activity;Shop;Driving;Yard Work;Laundry;Meal Prep    PT Treatment/Interventions  ADLs/Self Care Home Management;Cryotherapy;Electrical Stimulation;Ultrasound;Moist Heat;Therapeutic activities;Therapeutic exercise;Neuromuscular re-education;Patient/family education;Passive range of motion;Manual techniques;Dry needling;Energy conservation;Taping    PT Next Visit Plan  postural/shoulder strengthening activities; MT to address cervical, upper shoulder musculature; posture and body mechanics instruction     Consulted and Agree with Plan of Care  Patient       Patient will benefit from skilled therapeutic intervention in order to improve the following deficits and impairments:  Hypomobility, Decreased activity tolerance, Decreased strength, Impaired UE functional use, Pain, Increased muscle spasms, Decreased range of motion, Improper body mechanics, Postural dysfunction, Impaired flexibility  Visit Diagnosis: Cervicalgia  Other symptoms and signs involving the  musculoskeletal system  Abnormal posture  Muscle weakness (generalized)     Problem List Patient Active Problem List   Diagnosis Date Noted  . Onychomycosis 05/25/2018  . Palpitations 07/06/2017  . CAD (coronary artery disease) 07/05/2017  . Family history of early CAD 07/05/2017  . Abnormal findings on diagnostic imaging of cardiovascular system 06/23/2017  . Low vitamin D level 04/22/2017  . Pain in right ankle and joints of right foot 10/05/2016  . Hemarthrosis of right elbow 02/03/2016  . Small thenar eminence 02/03/2016  . Cervical disc disorder with radiculopathy of cervical region 10/07/2015  . Incomplete rotator cuff tear 09/10/2015  . Nonischemic cardiomyopathy ([place]) 05/15/2015  . Fatigue 05/15/2015  . Dyspnea on exertion 05/15/2015  . Decreased cardiac ejection fraction 04/02/2015  . Strain of latissimus dorsi muscle 01/21/2015  . Shoulder bursitis 10/12/2014  . Stenosis of lateral recess of lumbar spine 10/12/2014  . BPPV (benign paroxysmal positional vertigo) 08/14/2014  . Hyperlipidemia 01/19/2014  . Anemia, unspecified 01/19/2014  . ADD (attention deficit disorder) 01/19/2014  . Type 2 diabetes mellitus ([place]) 12/22/2013  . Hypogonadism in male 12/22/2013  . Arthritis of hand, left 12/22/2013    Bess Harvest, PTA 09/30/18 12:52 PM    [facility] [address]  [place], [place], [address] Phone: [phone]   Fax:  [phone]  Name: Reginald Rios MRN: [id number] Date of Birth: 1956/01/13

## 2018-10-04 ENCOUNTER — Other Ambulatory Visit: Payer: Self-pay | Admitting: Cardiology

## 2018-10-04 ENCOUNTER — Other Ambulatory Visit: Payer: Self-pay | Admitting: Family Medicine

## 2018-10-04 ENCOUNTER — Other Ambulatory Visit: Payer: Self-pay

## 2018-10-04 ENCOUNTER — Ambulatory Visit: Payer: 59 | Attending: Neurological Surgery | Admitting: Physical Therapy

## 2018-10-04 ENCOUNTER — Encounter: Payer: Self-pay | Admitting: Physical Therapy

## 2018-10-04 DIAGNOSIS — R29898 Other symptoms and signs involving the musculoskeletal system: Secondary | ICD-10-CM | POA: Diagnosis not present

## 2018-10-04 DIAGNOSIS — M542 Cervicalgia: Secondary | ICD-10-CM | POA: Diagnosis not present

## 2018-10-04 DIAGNOSIS — R293 Abnormal posture: Secondary | ICD-10-CM | POA: Diagnosis not present

## 2018-10-04 DIAGNOSIS — M6281 Muscle weakness (generalized): Secondary | ICD-10-CM | POA: Insufficient documentation

## 2018-10-04 MED FILL — EZETIMIBE 10 MG TABS: 10 | 90 days supply | Qty: 90 | Fill #0

## 2018-10-04 MED FILL — PIOGLITAZONE HCL 30 MG TAB: 30 | 30 days supply | Qty: 30 | Fill #0

## 2018-10-04 NOTE — Therapy (Signed)
[facility] [address]  [place], [place], [address] Phone: [phone]   Fax:  [phone]  Physical Therapy Treatment  Patient Details  Name: Reginald Rios MRN: [id number] Date of Birth: 02-01-1956 Referring Provider (PT): Kristeen Miss, MD   Encounter Date: 10/04/2018  PT End of Session - 10/04/18 0859    Visit Number  7    Number of Visits  17    Date for PT Re-Evaluation  11/09/18    Authorization Type  [facility]    PT Start Time  0805    PT Stop Time  0905    PT Time Calculation (min)  60 min    Activity Tolerance  Patient tolerated treatment well;Patient limited by pain    Behavior During Therapy  [facility] for tasks assessed/performed       Past Medical History:  Diagnosis Date  . ADD (attention deficit disorder)   . Anemia   . Arthritis   . CAD (coronary artery disease)    2/19 PCI/DESx1 to Lcx  . Diabetes mellitus without complication ([place])   . Exercise-induced asthma   . Heart murmur   . Hyperlipidemia    family hx of high cholesterol  . Hypogonadism in male   . Low back pain   . Osteoarthritis     Past Surgical History:  Procedure Laterality Date  . CORONARY STENT INTERVENTION N/A 06/23/2017   Procedure: CORONARY STENT INTERVENTION;  Surgeon: Leonie Man, MD;  Location: [place] [facility];  Service: Cardiovascular;  Laterality: N/A;  . INTRAVASCULAR PRESSURE WIRE/FFR STUDY N/A 06/23/2017   Procedure: INTRAVASCULAR PRESSURE WIRE/FFR STUDY;  Surgeon: Leonie Man, MD;  Location: [place] [facility];  Service: Cardiovascular;  Laterality: N/A;  . KNEE ARTHROSCOPY    . LEFT HEART CATH AND CORONARY ANGIOGRAPHY N/A 06/23/2017   Procedure: LEFT HEART CATH AND CORONARY ANGIOGRAPHY;  Surgeon: Leonie Man, MD;  Location: [place] [facility];  Service: Cardiovascular;  Laterality: N/A;  . TENOTOMY ACHILLES TENDON      There were no vitals filed for this visit.  Subjective Assessment - 10/04/18  0807    Subjective  Reports that he played 1.5 hours of pickle ball yesterday- neck felt great. However, having trouble with his R shoulder and arm. Feels that he is paying for it now. Was playing with his R arm.     Pertinent History  OA, LBP, HLD, heart murmur, asthma, DM, CAD, anemia, ADD, achilles tenotomy, L heart cath & angio 06/2017, coronary stent 06/2017, C4-6 fusion     Diagnostic tests  09/04/18 cervical MRI: No acute osseous injury of the cervical spine. Anterior cervical fusion from C4-C6. Moderate B foraminal stenosis at C4-5. Likely mild myelomalacia C5-6. At C3-4 there is moderate left facet arthropathy and left uncovertebral degenerative changes. Severe L foraminal stenosis.     Patient Stated Goals  "I'd like the pain to stop"    Pain Score  8    Pain Location  Shoulder    Pain Orientation  Right    Pain Descriptors / Indicators  Sharp    Pain Type  Acute pain                       [facility] Adult PT Treatment/Exercise - 10/04/18 0001      Shoulder Exercises: Supine   External Rotation  AAROM;Right;10 reps    External Rotation Limitations  with wand to tolerance  Flexion  AAROM;Right;10 reps    Flexion Limitations  with wand to tolerance   c/o delayed pain   ABduction  AAROM;Right;10 reps    ABduction Limitations  with wand to tolerance and folded pillow under R UE      Shoulder Exercises: Standing   Row  Strengthening;Both;10 reps;Theraband    Theraband Level (Shoulder Row)  Level 2 (Red)    Row Limitations  heavy cues for scapular retraction + depression      Shoulder Exercises: Pulleys   Flexion  3 minutes    Flexion Limitations  to tolerance    Scaption  3 minutes    Scaption Limitations  to tolerance      Shoulder Exercises: IT sales professional  30 seconds;2 reps    Corner Stretch Limitations  mid pec stretch in doorway to tolerance      Modalities   Modalities  Vasopneumatic      Vasopneumatic   Number Minutes Vasopneumatic   15  minutes    Vasopnuematic Location   Shoulder   R   Vasopneumatic Pressure  Low    Vasopneumatic Temperature   coldest      Manual Therapy   Manual Therapy  Soft tissue mobilization;Myofascial release    Manual therapy comments  sitting    Soft tissue mobilization  STM to R deltoid, LS, UT- TTP throughout    Myofascial Release  manual TPR to R middle delt and LS    Kinesiotex  Inhibit Muscle      Kinesiotix   Create Space  impingement patern around R deltoid + strip along pec at 50% stretch             PT Education - 10/04/18 0858    Education Details  update to HEP- instructed not to push into pain    Person(s) Educated  Patient    Methods  Explanation;Demonstration;Tactile cues;Verbal cues;Handout    Comprehension  Verbalized understanding;Returned demonstration       PT Short Term Goals - 09/28/18 0915      PT SHORT TERM GOAL #1   Title  Patient to be independent with initial HEP.    Time  4    Period  Weeks    Status  Achieved    Target Date  10/12/18        PT Long Term Goals - 09/28/18 0939      PT LONG TERM GOAL #1   Title  Patient to be independent with advanced HEP.    Time  8    Period  Weeks    Status  On-going      PT LONG TERM GOAL #2   Title  Patient to demonstrate [facility] and pain-free cervical AROM.    Time  8    Period  Weeks    Status  Partially Met   09/28/18: demonstrating significant improvement in all cervical AROM; partially achieved for cervical flexion pain free and 48 dg AROM     PT LONG TERM GOAL #3   Title  Patient to demonstrate >=4+/5 strength in B shoulders.    Time  8    Period  Weeks    Status  On-going      PT LONG TERM GOAL #4   Title  Patient to report tolerance for 4 hours of supine positioning without pain limiting.     Time  8    Period  Weeks    Status  On-going  PT LONG TERM GOAL #5   Title  Patient to report return to playing tennis without limitations or pain.    Time  8    Period  Weeks    Status   On-going            Plan - 10/04/18 0859    Clinical Impression Statement  Patient arrived to session with report of playing 1.5 hours of pickle ball with R UE yesterday. Notes that neck felt good, however having considerable pain in R shoulder today. Tolerated gentle STM and TPR to R deltoid, LS, and UT with c/o tenderness in these areas. Introduced gentle R shoulder AAROM in supine and instructed not to push into pain. Patient still with heavy cues required to correct forward head posture and rounded shoulders, likely leading to impingement in R shoulder. Tolerated rows with cues for postural correction well. Patient receiving taping to R shoulder for pain relief and ended session with [place] to R shoulder. Patient reported relief of pain at end of session. Advised patient to F/U with MD about R shoulder pain if it continues to progress. Patient reported understanding.     Comorbidities  OA, LBP, HLD, heart murmur, asthma, DM, CAD, anemia, ADD, achilles tenotomy, L heart cath & angio 06/2017, coronary stent 06/2017, C4-6 fusion    PT Treatment/Interventions  ADLs/Self Care Home Management;Cryotherapy;Electrical Stimulation;Ultrasound;Moist Heat;Therapeutic activities;Therapeutic exercise;Neuromuscular re-education;Patient/family education;Passive range of motion;Manual techniques;Dry needling;Energy conservation;Taping    PT Next Visit Plan  postural/shoulder strengthening activities; MT to address cervical, upper shoulder musculature; posture and body mechanics instruction     Consulted and Agree with Plan of Care  Patient       Patient will benefit from skilled therapeutic intervention in order to improve the following deficits and impairments:  Hypomobility, Decreased activity tolerance, Decreased strength, Impaired UE functional use, Pain, Increased muscle spasms, Decreased range of motion, Improper body mechanics, Postural dysfunction, Impaired flexibility  Visit  Diagnosis: Cervicalgia  Other symptoms and signs involving the musculoskeletal system  Abnormal posture  Muscle weakness (generalized)     Problem List Patient Active Problem List   Diagnosis Date Noted  . Onychomycosis 05/25/2018  . Palpitations 07/06/2017  . CAD (coronary artery disease) 07/05/2017  . Family history of early CAD 07/05/2017  . Abnormal findings on diagnostic imaging of cardiovascular system 06/23/2017  . Low vitamin D level 04/22/2017  . Pain in right ankle and joints of right foot 10/05/2016  . Hemarthrosis of right elbow 02/03/2016  . Small thenar eminence 02/03/2016  . Cervical disc disorder with radiculopathy of cervical region 10/07/2015  . Incomplete rotator cuff tear 09/10/2015  . Nonischemic cardiomyopathy ([place]) 05/15/2015  . Fatigue 05/15/2015  . Dyspnea on exertion 05/15/2015  . Decreased cardiac ejection fraction 04/02/2015  . Strain of latissimus dorsi muscle 01/21/2015  . Shoulder bursitis 10/12/2014  . Stenosis of lateral recess of lumbar spine 10/12/2014  . BPPV (benign paroxysmal positional vertigo) 08/14/2014  . Hyperlipidemia 01/19/2014  . Anemia, unspecified 01/19/2014  . ADD (attention deficit disorder) 01/19/2014  . Type 2 diabetes mellitus ([place]) 12/22/2013  . Hypogonadism in male 12/22/2013  . Arthritis of hand, left 12/22/2013    Janene Harvey, PT, DPT 10/04/18 9:08 AM   [facility] [address]  [place], [place], [address] Phone: [phone]   Fax:  [phone]  Name: LUKIS BUNT MRN: [id number] Date of Birth: 09-Nov-1955

## 2018-10-07 ENCOUNTER — Other Ambulatory Visit: Payer: Self-pay

## 2018-10-07 ENCOUNTER — Ambulatory Visit: Payer: 59

## 2018-10-07 DIAGNOSIS — M542 Cervicalgia: Secondary | ICD-10-CM | POA: Diagnosis not present

## 2018-10-07 DIAGNOSIS — M6281 Muscle weakness (generalized): Secondary | ICD-10-CM | POA: Diagnosis not present

## 2018-10-07 DIAGNOSIS — R293 Abnormal posture: Secondary | ICD-10-CM

## 2018-10-07 DIAGNOSIS — R29898 Other symptoms and signs involving the musculoskeletal system: Secondary | ICD-10-CM

## 2018-10-07 NOTE — Therapy (Signed)
[facility] [address]  [place], [place], [address] Phone: [phone]   Fax:  [phone]  Physical Therapy Treatment  Patient Details  Name: Reginald Rios MRN: [id number] Date of Birth: 1955-12-03 Referring Provider (PT): Kristeen Miss, MD   Encounter Date: 10/07/2018  PT End of Session - 10/07/18 0806    Visit Number  8    Number of Visits  17    Date for PT Re-Evaluation  11/09/18    Authorization Type  [facility]    PT Start Time  0800    PT Stop Time  0900    PT Time Calculation (min)  60 min    Activity Tolerance  Patient tolerated treatment well;Patient limited by pain    Behavior During Therapy  [facility] for tasks assessed/performed       Past Medical History:  Diagnosis Date  . ADD (attention deficit disorder)   . Anemia   . Arthritis   . CAD (coronary artery disease)    2/19 PCI/DESx1 to Lcx  . Diabetes mellitus without complication ([place])   . Exercise-induced asthma   . Heart murmur   . Hyperlipidemia    family hx of high cholesterol  . Hypogonadism in male   . Low back pain   . Osteoarthritis     Past Surgical History:  Procedure Laterality Date  . CORONARY STENT INTERVENTION N/A 06/23/2017   Procedure: CORONARY STENT INTERVENTION;  Surgeon: Leonie Man, MD;  Location: [place] [facility];  Service: Cardiovascular;  Laterality: N/A;  . INTRAVASCULAR PRESSURE WIRE/FFR STUDY N/A 06/23/2017   Procedure: INTRAVASCULAR PRESSURE WIRE/FFR STUDY;  Surgeon: Leonie Man, MD;  Location: [place] [facility];  Service: Cardiovascular;  Laterality: N/A;  . KNEE ARTHROSCOPY    . LEFT HEART CATH AND CORONARY ANGIOGRAPHY N/A 06/23/2017   Procedure: LEFT HEART CATH AND CORONARY ANGIOGRAPHY;  Surgeon: Leonie Man, MD;  Location: [place] [facility];  Service: Cardiovascular;  Laterality: N/A;  . TENOTOMY ACHILLES TENDON      There were no vitals filed for this visit.  Subjective Assessment - 10/07/18  0804    Subjective  Pt. reporting R shoulder pain is still primary concern today.  Unsure of benefit from taping and removed on day three due to concerns over skin irritation.      Pertinent History  OA, LBP, HLD, heart murmur, asthma, DM, CAD, anemia, ADD, achilles tenotomy, L heart cath & angio 06/2017, coronary stent 06/2017, C4-6 fusion     Diagnostic tests  09/04/18 cervical MRI: No acute osseous injury of the cervical spine. Anterior cervical fusion from C4-C6. Moderate B foraminal stenosis at C4-5. Likely mild myelomalacia C5-6. At C3-4 there is moderate left facet arthropathy and left uncovertebral degenerative changes. Severe L foraminal stenosis.     Patient Stated Goals  "I'd like the pain to stop"    Currently in Pain?  Yes    Pain Score  7     Pain Location  Neck    Pain Orientation  Right;Upper    Pain Descriptors / Indicators  Sharp    Pain Type  Chronic pain    Pain Onset  More than a month ago    Pain Frequency  Intermittent    Aggravating Factors   Moist heat    Multiple Pain Sites  No  [facility] Adult PT Treatment/Exercise - 10/07/18 0001      Neck Exercises: Machines for Strengthening   UBE (Upper Arm Bike)  Lvl 1.5, 3 min forward/3 min backwards       Shoulder Exercises: Supine   Protraction  Right;15 reps    Protraction Weight (lbs)  1    External Rotation  AAROM;Right;10 reps    External Rotation Limitations  elbow elevated in scapular plane resting on bolster; wand    Flexion  AAROM;Right;15 reps    Flexion Limitations  wand - cues to avoid painful ROM       Moist Heat Therapy   Number Minutes Moist Heat  15 Minutes    Moist Heat Location  Shoulder      Electrical Stimulation   Electrical Stimulation Location  R UE/shoulder    Electrical Stimulation Action  IFC    Electrical Stimulation Parameters   to tolerance, 15'    Electrical Stimulation Goals  Pain;Tone      Manual Therapy   Manual Therapy  Soft tissue  mobilization;Myofascial release    Manual therapy comments  supine     Soft tissue mobilization  STM to R deltoid complex, R posterior/inferior shoulder, TTP throughout with palpable TP in R anterior lateral deltoid     Myofascial Release  manual TPR to R lateral/anterior delt    Passive ROM  R shoulder gentle PROM with prolonged holds all directions                PT Short Term Goals - 09/28/18 0915      PT SHORT TERM GOAL #1   Title  Patient to be independent with initial HEP.    Time  4    Period  Weeks    Status  Achieved    Target Date  10/12/18        PT Long Term Goals - 09/28/18 0939      PT LONG TERM GOAL #1   Title  Patient to be independent with advanced HEP.    Time  8    Period  Weeks    Status  On-going      PT LONG TERM GOAL #2   Title  Patient to demonstrate [facility] and pain-free cervical AROM.    Time  8    Period  Weeks    Status  Partially Met   09/28/18: demonstrating significant improvement in all cervical AROM; partially achieved for cervical flexion pain free and 48 dg AROM     PT LONG TERM GOAL #3   Title  Patient to demonstrate >=4+/5 strength in B shoulders.    Time  8    Period  Weeks    Status  On-going      PT LONG TERM GOAL #4   Title  Patient to report tolerance for 4 hours of supine positioning without pain limiting.     Time  8    Period  Weeks    Status  On-going         PT LONG TERM GOAL #5   Title  Patient to report return to playing tennis without limitations or pain.    Time  8    Period  Weeks    Status  On-going            Plan - 10/07/18 6967    Clinical Impression Statement  Pt. unsure of benefit from R shoulder taping and removed on day three due to concerns over skin irritation.  Did not reapply taping today. Patient noting R lateral shoulder and R UE pain continues to be his primary concern today with neck pain remaining well controlled.  Session focused on gentle R shoulder AAROM to tolerance however heavy  cueing required to avoid painful arc and for proper positioning in scapular plane with wand ER today.  Pt. noting improved tolerance for AAROM wand activities after cueing today.  MT focused on shoulder complex ttp and R anterior/lateral palpable TP with only limited relief and response noted following.  Will likely require further MT/DTM to R deltoid in future visits as pt. noting benefit from this.  Ended session with moist heat/E-stim applied to R UE/shoulder with good overall pain relief noted following.  Pt. ending session reporting he plans to play "pickle-ball" with his wife for 1-hour later today.      Personal Factors and Comorbidities  Age;Past/Current Experience;Comorbidity 3+;Time since onset of injury/illness/exacerbation;Fitness    Comorbidities  OA, LBP, HLD, heart murmur, asthma, DM, CAD, anemia, ADD, achilles tenotomy, L heart cath & angio 06/2017, coronary stent 06/2017, C4-6 fusion    Examination-Activity Limitations  Reach Overhead;Bend;Caring for Others;Sleep;Dressing;Hygiene/Grooming;Lift    Examination-Participation Restrictions  Cleaning;Church;Community Activity;Shop;Driving;Yard Work;Laundry;Meal Prep    PT Treatment/Interventions  ADLs/Self Care Home Management;Cryotherapy;Electrical Stimulation;Ultrasound;Moist Heat;Therapeutic activities;Therapeutic exercise;Neuromuscular re-education;Patient/family education;Passive range of motion;Manual techniques;Dry needling;Energy conservation;Taping    PT Next Visit Plan  postural/shoulder strengthening activities; MT to address cervical, upper shoulder musculature; posture and body mechanics instruction     Consulted and Agree with Plan of Care  Patient       Patient will benefit from skilled therapeutic intervention in order to improve the following deficits and impairments:  Hypomobility, Decreased activity tolerance, Decreased strength, Impaired UE functional use, Pain, Increased muscle spasms, Decreased range of motion, Improper  body mechanics, Postural dysfunction, Impaired flexibility  Visit Diagnosis: Cervicalgia  Other symptoms and signs involving the musculoskeletal system  Abnormal posture  Muscle weakness (generalized)     Problem List Patient Active Problem List   Diagnosis Date Noted  . Onychomycosis 05/25/2018  . Palpitations 07/06/2017  . CAD (coronary artery disease) 07/05/2017  . Family history of early CAD 07/05/2017  . Abnormal findings on diagnostic imaging of cardiovascular system 06/23/2017  . Low vitamin D level 04/22/2017  . Pain in right ankle and joints of right foot 10/05/2016  . Hemarthrosis of right elbow 02/03/2016  . Small thenar eminence 02/03/2016  . Cervical disc disorder with radiculopathy of cervical region 10/07/2015  . Incomplete rotator cuff tear 09/10/2015  . Nonischemic cardiomyopathy ([place]) 05/15/2015  . Fatigue 05/15/2015  . Dyspnea on exertion 05/15/2015  . Decreased cardiac ejection fraction 04/02/2015  . Strain of latissimus dorsi muscle 01/21/2015  . Shoulder bursitis 10/12/2014  . Stenosis of lateral recess of lumbar spine 10/12/2014  . BPPV (benign paroxysmal positional vertigo) 08/14/2014  . Hyperlipidemia 01/19/2014  . Anemia, unspecified 01/19/2014  . ADD (attention deficit disorder) 01/19/2014  . Type 2 diabetes mellitus ([place]) 12/22/2013  . Hypogonadism in male 12/22/2013  . Arthritis of hand, left 12/22/2013    Bess Harvest, PTA 10/07/18 12:48 PM   [facility] [address]  [place], [place], [address] Phone: [phone]   Fax:  [phone]  Name: Reginald Rios MRN: [id number] Date of Birth: [birth year]

## 2018-10-11 ENCOUNTER — Other Ambulatory Visit: Payer: Self-pay

## 2018-10-11 ENCOUNTER — Ambulatory Visit: Payer: 59

## 2018-10-11 DIAGNOSIS — R29898 Other symptoms and signs involving the musculoskeletal system: Secondary | ICD-10-CM | POA: Diagnosis not present

## 2018-10-11 DIAGNOSIS — R293 Abnormal posture: Secondary | ICD-10-CM

## 2018-10-11 DIAGNOSIS — M6281 Muscle weakness (generalized): Secondary | ICD-10-CM

## 2018-10-11 DIAGNOSIS — M542 Cervicalgia: Secondary | ICD-10-CM | POA: Diagnosis not present

## 2018-10-11 NOTE — Therapy (Signed)
[facility] [address]  [place], [place], [address] Phone: [phone]   Fax:  [phone]  Physical Therapy Treatment  Patient Details  Name: Reginald Rios MRN: [id number] Date of Birth: 1955-12-30 Referring Provider (PT): Kristeen Miss, MD   Encounter Date: 10/11/2018  PT End of Session - 10/11/18 0813    Visit Number  9    Number of Visits  17    Date for PT Re-Evaluation  11/09/18    Authorization Type  [facility]    PT Start Time  0804    PT Stop Time  0903    PT Time Calculation (min)  59 min    Activity Tolerance  Patient tolerated treatment well;Patient limited by pain    Behavior During Therapy  [facility] for tasks assessed/performed       Past Medical History:  Diagnosis Date  . ADD (attention deficit disorder)   . Anemia   . Arthritis   . CAD (coronary artery disease)    2/19 PCI/DESx1 to Lcx  . Diabetes mellitus without complication ([place])   . Exercise-induced asthma   . Heart murmur   . Hyperlipidemia    family hx of high cholesterol  . Hypogonadism in male   . Low back pain   . Osteoarthritis     Past Surgical History:  Procedure Laterality Date  . CORONARY STENT INTERVENTION N/A 06/23/2017   Procedure: CORONARY STENT INTERVENTION;  Surgeon: Leonie Man, MD;  Location: [place] [facility];  Service: Cardiovascular;  Laterality: N/A;  . INTRAVASCULAR PRESSURE WIRE/FFR STUDY N/A 06/23/2017   Procedure: INTRAVASCULAR PRESSURE WIRE/FFR STUDY;  Surgeon: Leonie Man, MD;  Location: [place] [facility];  Service: Cardiovascular;  Laterality: N/A;  . KNEE ARTHROSCOPY    . LEFT HEART CATH AND CORONARY ANGIOGRAPHY N/A 06/23/2017   Procedure: LEFT HEART CATH AND CORONARY ANGIOGRAPHY;  Surgeon: Leonie Man, MD;  Location: [place] [facility];  Service: Cardiovascular;  Laterality: N/A;  . TENOTOMY ACHILLES TENDON      There were no vitals filed for this visit.  Subjective Assessment - 10/11/18  0808    Subjective  Pt. noting his shoulder pain continues to be primary concern.      Pertinent History  OA, LBP, HLD, heart murmur, asthma, DM, CAD, anemia, ADD, achilles tenotomy, L heart cath & angio 06/2017, coronary stent 06/2017, C4-6 fusion     Diagnostic tests  09/04/18 cervical MRI: No acute osseous injury of the cervical spine. Anterior cervical fusion from C4-C6. Moderate B foraminal stenosis at C4-5. Likely mild myelomalacia C5-6. At C3-4 there is moderate left facet arthropathy and left uncovertebral degenerative changes. Severe L foraminal stenosis.     Patient Stated Goals  "I'd like the pain to stop"    Currently in Pain?  Yes    Pain Score  6     Pain Location  Neck    Pain Orientation  Right;Upper    Pain Descriptors / Indicators  Sharp    Pain Type  Chronic pain    Pain Radiating Towards  dull ache pain into R biceps and numbness/weakness into R hand    Pain Onset  More than a month ago    Pain Frequency  Intermittent    Aggravating Factors   elevation exercises    Pain Relieving Factors  moist heat     Pain Score  8    Pain Location  Shoulder    Pain  Orientation  Right    Pain Descriptors / Indicators  Sharp    Pain Type  Acute pain    Aggravating Factors   elevation type of exercises, tennis, overhead painting         [facility] PT Assessment - 10/11/18 0001      AROM   AROM Assessment Site  Cervical    Cervical Flexion  52    Cervical Extension  39    Cervical - Right Side Bend  20    Cervical - Left Side Bend  23    Cervical - Right Rotation  45    Cervical - Left Rotation  48                   [facility] Adult PT Treatment/Exercise - 10/11/18 0001      Neck Exercises: Theraband   Rows  15 reps;Red    Rows Limitations  Tactile cueing required for scapular retraction/depression      Neck Exercises: Seated   Shoulder Rolls  Backwards;15 reps    Shoulder Rolls Limitations  Focusing on large R scapular motions all directions       Moist Heat  Therapy   Number Minutes Moist Heat  15 Minutes    Moist Heat Location  Cervical      Vasopneumatic   Number Minutes Vasopneumatic   15 minutes    Vasopnuematic Location   Shoulder   R   Vasopneumatic Pressure  Low    Vasopneumatic Temperature   coldest      Manual Therapy   Manual Therapy  Soft tissue mobilization;Myofascial release    Manual therapy comments  supine     Soft tissue mobilization  STM to R UT,LS,posterior/inferior shoulder, proximal biceps    Myofascial Release  manual TPR to R UT - palpable TP    Passive ROM  R shoulder and cervical gentle PROM with prolonged holds all directions       Neck Exercises: Stretches   Upper Trapezius Stretch  Right;Left;30 seconds;2 reps    Upper Trapezius Stretch Limitations  B     Levator Stretch  Right;Left;30 seconds;2 reps    Levator Stretch Limitations  B     Corner Stretch  2 reps;30 seconds    Corner Stretch Limitations  low in doorway                PT Short Term Goals - 09/28/18 0915      PT SHORT TERM GOAL #1   Title  Patient to be independent with initial HEP.    Time  4    Period  Weeks    Status  Achieved    Target Date  10/12/18        PT Long Term Goals - 10/11/18 0903      PT LONG TERM GOAL #1   Title  Patient to be independent with advanced HEP.    Time  8    Period  Weeks    Status  On-going      PT LONG TERM GOAL #2   Title  Patient to demonstrate [facility] and pain-free cervical AROM.    Time  8    Period  Weeks    Status  Partially Met   10/11/18: demonstrating significant improvement in all cervical AROM      PT LONG TERM GOAL #3   Title  Patient to demonstrate >=4+/5 strength in B shoulders.    Time  8    Period  Weeks    Status  On-going      PT LONG TERM GOAL #4   Title  Patient to report tolerance for 4 hours of supine positioning without pain limiting.     Time  8    Period  Weeks    Status  On-going         PT LONG TERM GOAL #5   Title  Patient to report return to playing  tennis without limitations or pain.    Time  8    Period  Weeks    Status  On-going            Plan - 10/11/18 7510    Clinical Impression Statement  Pt. reporting he played an hour of tennis and painted for three hours over weekend and now has increased R shoulder pain.  Noting secondary concern today is upper shoulder/R-sided neck pain.  Neck/upper shoulder pain responding well to MT and gentle ROM activities with reduction in pain and reduction in muscular tone.  Cervical AROM demonstrating improvement in nearly all planes today progressing toward LTG #3.  Discussed pt. ongoing complaint of R shoulder pain with radiating pain into R biceps and R hand weakness.  Pt. encouraged to contact MD if R shoulder pain continues as shoulder/R UE pain slow to respond to ROM/MT in therapy sessions.  Pt. verbalizing understanding.  Ended session with ice/compression to R shoulder to reduce possible inflammation following over-activities over weekend and moist heat applied to cervical spine for reduction in tenderness/tone.    Personal Factors and Comorbidities  Age;Past/Current Experience;Comorbidity 3+;Time since onset of injury/illness/exacerbation;Fitness    Comorbidities  OA, LBP, HLD, heart murmur, asthma, DM, CAD, anemia, ADD, achilles tenotomy, L heart cath & angio 06/2017, coronary stent 06/2017, C4-6 fusion    Examination-Activity Limitations  Reach Overhead;Bend;Caring for Others;Sleep;Dressing;Hygiene/Grooming;Lift    Examination-Participation Restrictions  Cleaning;Church;Community Activity;Shop;Driving;Yard Work;Laundry;Meal Prep    PT Treatment/Interventions  ADLs/Self Care Home Management;Cryotherapy;Electrical Stimulation;Ultrasound;Moist Heat;Therapeutic activities;Therapeutic exercise;Neuromuscular re-education;Patient/family education;Passive range of motion;Manual techniques;Dry needling;Energy conservation;Taping    PT Next Visit Plan  postural/shoulder strengthening activities; MT to  address cervical, upper shoulder musculature; posture and body mechanics instruction     Consulted and Agree with Plan of Care  Patient       Patient will benefit from skilled therapeutic intervention in order to improve the following deficits and impairments:  Hypomobility, Decreased activity tolerance, Decreased strength, Impaired UE functional use, Pain, Increased muscle spasms, Decreased range of motion, Improper body mechanics, Postural dysfunction, Impaired flexibility  Visit Diagnosis: Cervicalgia  Other symptoms and signs involving the musculoskeletal system  Abnormal posture  Muscle weakness (generalized)     Problem List Patient Active Problem List   Diagnosis Date Noted  . Onychomycosis 05/25/2018  . Palpitations 07/06/2017  . CAD (coronary artery disease) 07/05/2017  . Family history of early CAD 07/05/2017  . Abnormal findings on diagnostic imaging of cardiovascular system 06/23/2017  . Low vitamin D level 04/22/2017  . Pain in right ankle and joints of right foot 10/05/2016  . Hemarthrosis of right elbow 02/03/2016  . Small thenar eminence 02/03/2016  . Cervical disc disorder with radiculopathy of cervical region 10/07/2015  . Incomplete rotator cuff tear 09/10/2015  . Nonischemic cardiomyopathy ([place]) 05/15/2015  . Fatigue 05/15/2015  . Dyspnea on exertion 05/15/2015  . Decreased cardiac ejection fraction 04/02/2015  . Strain of latissimus dorsi muscle 01/21/2015  . Shoulder bursitis 10/12/2014  . Stenosis of lateral recess of lumbar spine 10/12/2014  . BPPV (  benign paroxysmal positional vertigo) 08/14/2014  . Hyperlipidemia 01/19/2014  . Anemia, unspecified 01/19/2014  . ADD (attention deficit disorder) 01/19/2014  . Type 2 diabetes mellitus ([place]) 12/22/2013  . Hypogonadism in male 12/22/2013  . Arthritis of hand, left 12/22/2013    Bess Harvest, PTA 10/11/18 9:12 AM   [facility] [address]  [place], [place], [address] Phone: [phone]   Fax:  [phone]  Name: Reginald Rios MRN: [id number] Date of Birth: [birth year]

## 2018-10-13 ENCOUNTER — Ambulatory Visit: Payer: 59 | Admitting: Family Medicine

## 2018-10-13 ENCOUNTER — Other Ambulatory Visit: Payer: Self-pay

## 2018-10-13 ENCOUNTER — Ambulatory Visit: Payer: Self-pay

## 2018-10-13 ENCOUNTER — Encounter: Payer: Self-pay | Admitting: Family Medicine

## 2018-10-13 VITALS — BP 132/72 | HR 84 | Ht 70.0 in | Wt 193.0 lb

## 2018-10-13 DIAGNOSIS — M5010 Cervical disc disorder with radiculopathy, unspecified cervical region: Secondary | ICD-10-CM | POA: Diagnosis not present

## 2018-10-13 DIAGNOSIS — M7551 Bursitis of right shoulder: Secondary | ICD-10-CM | POA: Diagnosis not present

## 2018-10-13 DIAGNOSIS — M25511 Pain in right shoulder: Secondary | ICD-10-CM

## 2018-10-13 MED ORDER — METHYLPREDNISOLONE ACETATE 40 MG/ML IJ SUSP
40.0000 mg | Freq: Once | INTRAMUSCULAR | Status: AC
  Administered 2018-10-13: 40 mg

## 2018-10-13 MED ORDER — BACLOFEN 10 MG PO TABS: 5.0000 mg | ORAL_TABLET | Freq: Two times a day (BID) | ORAL | 0 refills | Status: DC | PRN

## 2018-10-13 MED FILL — PIOGLITAZONE HCL 30 MG TAB: 30 | 30 days supply | Qty: 30 | Fill #0

## 2018-10-13 MED FILL — EZETIMIBE 10 MG TABS: 10 | 90 days supply | Qty: 90 | Fill #1

## 2018-10-13 MED FILL — BACLOFEN 10 MG TABS: 10 | 30 days supply | Qty: 30 | Fill #0

## 2018-10-13 NOTE — Patient Instructions (Signed)
Nice to meet you Please try the exercises for the shoulder  Please try volarten cream  Please try increasing the gabapentin to 2 or 3 times daily  Please try the baclofen.   Please send me a message in MyChart with any questions or updates.  Please see me back in 4 weeks.   --Dr. Raeford Razor

## 2018-10-13 NOTE — Progress Notes (Signed)
Reginald Rios - 63 y.o. male MRN [id number]  Date of birth: [birth year]  SUBJECTIVE:  Including CC & ROS.  Chief Complaint  Patient presents with  . Arm Pain    right arm    Reginald Rios is a 63 y.o. male that is  Presenting with acute on chronic right arm pain and altered sensation. He feels changes on the radial aspect of his distal arm. He also experiences pain on the lateral shoulder. He was using a heavier tennis racket and that may have exacerbated his pian. The pain is 7/10 around his shoulder. He experiences weakness in his right hand and drops things intermittently and unintentionally. The pain in the shoulder has been occurring since March. The altered sensation is chronic and has had a neck surgery to help with decompression.  Take ibuprofen with limited improvement.   Independent review of the cervical MRI from 5/3 shows prior fusion C4-C6.  Stenosis at C4-5.  C3-4 with facet arthropathy.   Review of Systems  Constitutional: Negative for fever.  HENT: Negative for congestion.   Respiratory: Negative for cough.   Cardiovascular: Negative for chest pain.  Gastrointestinal: Negative for abdominal pain.  Musculoskeletal: Positive for arthralgias and neck stiffness.  Skin: Negative for color change.  Neurological: Positive for weakness and numbness.  Hematological: Negative for adenopathy.    HISTORY: Past Medical, Surgical, Social, and Family History Reviewed & Updated per EMR.   Pertinent Historical Findings include:  Past Medical History:  Diagnosis Date  . ADD (attention deficit disorder)   . Anemia   . Arthritis   . CAD (coronary artery disease)    2/19 PCI/DESx1 to Lcx  . Diabetes mellitus without complication ([place])   . Exercise-induced asthma   . Heart murmur   . Hyperlipidemia    family hx of high cholesterol  . Hypogonadism in male   . Low back pain   . Osteoarthritis     Past Surgical History:  Procedure Laterality Date  . CORONARY STENT  INTERVENTION N/A 06/23/2017   Procedure: CORONARY STENT INTERVENTION;  Surgeon: Leonie Man, MD;  Location: [facility];  Service: Cardiovascular;  Laterality: N/A;  . INTRAVASCULAR PRESSURE WIRE/FFR STUDY N/A 06/23/2017   Procedure: INTRAVASCULAR PRESSURE WIRE/FFR STUDY;  Surgeon: Leonie Man, MD;  Location: [facility];  Service: Cardiovascular;  Laterality: N/A;  . KNEE ARTHROSCOPY    . LEFT HEART CATH AND CORONARY ANGIOGRAPHY N/A 06/23/2017   Procedure: LEFT HEART CATH AND CORONARY ANGIOGRAPHY;  Surgeon: Leonie Man, MD;  Location: [facility];  Service: Cardiovascular;  Laterality: N/A;  . TENOTOMY ACHILLES TENDON      Allergies  Allergen Reactions  . Invokana [Canagliflozin] Rash  . Statins Rash and Other (See Comments)    Muscle aches   . Lisinopril Cough    Family History  Problem Relation Age of Onset  . Lung cancer Mother 45  . Cancer Mother        lung cancer  . Coronary artery disease Father 52  . Heart disease Father   . Hypothyroidism Brother   . Colon cancer Neg Hx   . Prostate cancer Neg Hx      Social History   Socioeconomic History  . Marital status: Married    Spouse name: Not on file  . Number of children: Not on file  . Years of education: Not on file  . Highest education level: Not on file  Occupational History  .  Occupation: Air cabin crew   Social Needs  . Financial resource strain: Not on file  . Food insecurity    Worry: Not on file    Inability: Not on file  . Transportation needs    Medical: Not on file    Non-medical: Not on file  Tobacco Use  . Smoking status: Never Smoker  . Smokeless tobacco: Never Used  Substance and Sexual Activity  . Alcohol use: Yes    Alcohol/week: 0.0 standard drinks    Comment: rare  . Drug use: No  . Sexual activity: Not on file  Lifestyle  . Physical activity    Days per week: Not on file    Minutes per session: Not on file  . Stress: Not on file  Relationships  .  Social Herbalist on phone: Not on file    Gets together: Not on file    Attends religious service: Not on file    Active member of club or organization: Not on file    Attends meetings of clubs or organizations: Not on file    Relationship status: Not on file  . Intimate partner violence    Fear of current or ex partner: Not on file    Emotionally abused: Not on file    Physically abused: Not on file    Forced sexual activity: Not on file  Other Topics Concern  . Not on file  Social History Narrative   Previously worked as Geophysicist/field seismologist   Currently working at [employer] clinic   Married 82 years   2 children ages 61, 46   Wife is psychiatrist - Dr. Margurite Auerbach   Moved from [place] up in [place]:  VS: BP 132/72   Pulse 84   Ht 5\' 10"  (1.778 m)   Wt 193 lb (87.5 kg)   BMI 27.69 kg/m  Physical Exam Gen: NAD, alert, cooperative with exam, well-appearing ENT: normal lips, normal nasal mucosa,  Eye: normal EOM, normal conjunctiva and lids CV:  no edema, +2 pedal pulses   Resp: no accessory muscle use, non-labored,  Skin: no rashes, no areas of induration  Neuro: normal tone, normal sensation to touch Psych:  normal insight, alert and oriented MSK:  Right shoulder/arm:  Normal ROM  Normal strength to resistance with IR and ER  Normal ER ROM  Pain with empty can testing and Hawkin's testing  Atrophy present over the thenar and hypothenar imminence  Has good strength in right hand but less than left.  Normal elbow ROM    Aspiration/Injection Procedure Note Reginald Rios [birth year]  Procedure: Injection Indications: Right shoulder pain   Procedure Details Consent: Risks of procedure as well as the alternatives and risks of each were explained to the (patient/caregiver).  Consent for procedure obtained. Time Out: Verified patient identification, verified procedure, site/side was marked, verified correct patient position, special  equipment/implants available, medications/allergies/relevent history reviewed, required imaging and test results available.  Performed.  The area was cleaned with iodine and alcohol swabs.    The right shoulder subacromial space was injected using 1 cc's of 40 mg Depo-medrol and 4 cc's of 0.5% bupivacaine with a 25 1 1/2" needle.  Ultrasound was used. Images were obtained in long views showing the injection.     A sterile dressing was applied.  Patient did tolerate procedure well.       ASSESSMENT & PLAN:   Shoulder bursitis Seems to have exacerbated  with playing with a different tennis racket. Does endorse pain locally with the other pain down the arm. Bursa was observed on [place].  - subacromial injection  - counseled on HEP and supportive care - if no improvement consider imaging or other injection or PT  Cervical disc disorder with radiculopathy of cervical region Acute on chronic symptoms at this time. Atrophy appears chronic. Prior EMG done in 2017 with multi level cervical fusion. Weakness seems more pronounced. Recent MRI doesn't suggest impingement at the neck. Has had an ulnar nerve transposition.  - baclofen  - counseled on HEP and supportive care - can consider EMG or trigger point injections

## 2018-10-14 ENCOUNTER — Encounter: Payer: Self-pay | Admitting: Physical Therapy

## 2018-10-14 ENCOUNTER — Ambulatory Visit: Payer: 59 | Admitting: Physical Therapy

## 2018-10-14 DIAGNOSIS — M6281 Muscle weakness (generalized): Secondary | ICD-10-CM | POA: Diagnosis not present

## 2018-10-14 DIAGNOSIS — M542 Cervicalgia: Secondary | ICD-10-CM

## 2018-10-14 DIAGNOSIS — R293 Abnormal posture: Secondary | ICD-10-CM

## 2018-10-14 DIAGNOSIS — R29898 Other symptoms and signs involving the musculoskeletal system: Secondary | ICD-10-CM

## 2018-10-14 NOTE — Therapy (Signed)
[facility] [address]  [place], [place], [address] Phone: [phone]   Fax:  [phone]  Physical Therapy Progress Note  Patient Details  Name: Reginald Rios MRN: [id number] Date of Birth: [birth year] Referring Provider (PT): Kristeen Miss, MD   Encounter Date: 10/14/2018  PT End of Session - 10/14/18 0844    Visit Number  10    Number of Visits  17    Date for PT Re-Evaluation  11/09/18    Authorization Type  [facility]    PT Start Time  0759    PT Stop Time  0858    PT Time Calculation (min)  59 min    Activity Tolerance  Patient tolerated treatment well;Patient limited by pain    Behavior During Therapy  [facility] for tasks assessed/performed       Past Medical History:  Diagnosis Date  . ADD (attention deficit disorder)   . Anemia   . Arthritis   . CAD (coronary artery disease)    2/19 PCI/DESx1 to Lcx  . Diabetes mellitus without complication ([place])   . Exercise-induced asthma   . Heart murmur   . Hyperlipidemia    family hx of high cholesterol  . Hypogonadism in male   . Low back pain   . Osteoarthritis     Past Surgical History:  Procedure Laterality Date  . CORONARY STENT INTERVENTION N/A 06/23/2017   Procedure: CORONARY STENT INTERVENTION;  Surgeon: Leonie Man, MD;  Location: [facility];  Service: Cardiovascular;  Laterality: N/A;  . INTRAVASCULAR PRESSURE WIRE/FFR STUDY N/A 06/23/2017   Procedure: INTRAVASCULAR PRESSURE WIRE/FFR STUDY;  Surgeon: Leonie Man, MD;  Location: [facility];  Service: Cardiovascular;  Laterality: N/A;  . KNEE ARTHROSCOPY    . LEFT HEART CATH AND CORONARY ANGIOGRAPHY N/A 06/23/2017   Procedure: LEFT HEART CATH AND CORONARY ANGIOGRAPHY;  Surgeon: Leonie Man, MD;  Location: [facility];  Service: Cardiovascular;  Laterality: N/A;  . TENOTOMY ACHILLES TENDON      There were no vitals filed for this visit.  Subjective Assessment -  10/14/18 0801    Subjective  Reports that he saw sports med MD who have him a cortisone injection to R shoulder. Feeling good today from the neck up, but still having shoulder pain. Reports 80% improvement in neck- still gets HAs with certain exercises and still having trouble turning L when driving.    Pertinent History  OA, LBP, HLD, heart murmur, asthma, DM, CAD, anemia, ADD, achilles tenotomy, L heart cath & angio 06/2017, coronary stent 06/2017, C4-6 fusion     Diagnostic tests  09/04/18 cervical MRI: No acute osseous injury of the cervical spine. Anterior cervical fusion from C4-C6. Moderate B foraminal stenosis at C4-5. Likely mild myelomalacia C5-6. At C3-4 there is moderate left facet arthropathy and left uncovertebral degenerative changes. Severe L foraminal stenosis.     Patient Stated Goals  "I'd like the pain to stop"    Currently in Pain?  Yes    Pain Score  8    Pain Location  Shoulder    Pain Orientation  Right    Pain Descriptors / Indicators  Sharp    Pain Type  Acute pain         [facility] PT Assessment - 10/14/18 0001      Observation/Other Assessments   Focus on Therapeutic Outcomes (FOTO)   Cervical: 44 (56% limited, 49% predicted)  AROM   AROM Assessment Site  Cervical    Cervical Flexion  55    Cervical Extension  28   "stiffness"   Cervical - Right Side Bend  20   5/10 pain   Cervical - Left Side Bend  23   5/10 pain   Cervical - Right Rotation  36   "stiffness"   Cervical - Left Rotation  30   4/10 pain     Strength   Right/Left Shoulder  Right;Left    Right Shoulder Flexion  4+/5   6/10 pain over R lateral arm   Right Shoulder ABduction  3+/5   9/10 pain in lateral arm   Right Shoulder Internal Rotation  3+/5   4/10 pain   Right Shoulder External Rotation  3+/5   7/10 pain   Left Shoulder Flexion  5/5    Left Shoulder ABduction  5/5    Left Shoulder Internal Rotation  5/5    Left Shoulder External Rotation  4+/5                    [facility] Adult PT Treatment/Exercise - 10/14/18 0001      Neck Exercises: Machines for Strengthening   UBE (Upper Arm Bike)  Lvl 1.0, 3 min forward/3 min backwards       Neck Exercises: Seated   Neck Retraction  10 reps;3 secs    Neck Retraction Limitations  2nd set with yellow TB; cues to tilt chin down slightly    Other Seated Exercise  cervical extension SNAG to tolerance 10x      Vasopneumatic   Number Minutes Vasopneumatic   15 minutes    Vasopnuematic Location   Shoulder   R   Vasopneumatic Pressure  Low    Vasopneumatic Temperature   coldest      Neck Exercises: Stretches   Upper Trapezius Stretch  Right;Left;30 seconds;1 rep    Upper Trapezius Stretch Limitations  B; sitting on hands    Levator Stretch  Right;Left;30 seconds;1 rep    Levator Stretch Limitations  B; sitting on hands    Other Neck Stretches  R & L occipital stretch 5x3" to tolerance    Other Neck Stretches  scalene stretch R & L 30"             PT Education - 10/14/18 0844    Education Details  update to HEP; discussion on objective progress with PT thus far    Person(s) Educated  Patient    Methods  Explanation;Demonstration;Tactile cues;Verbal cues;Handout    Comprehension  Verbalized understanding;Returned demonstration       PT Short Term Goals - 10/14/18 0809      PT SHORT TERM GOAL #1   Title  Patient to be independent with initial HEP.    Time  4    Period  Weeks    Status  Achieved    Target Date  10/12/18        PT Long Term Goals - 10/14/18 0809      PT LONG TERM GOAL #1   Title  Patient to be independent with advanced HEP.    Time  8    Period  Weeks    Status  Partially Met   met for current     PT LONG TERM GOAL #2   Title  Patient to demonstrate [facility] and pain-free cervical AROM.    Time  8    Period  Weeks    Status  Partially Met   demonstrating significant improvement in all cervical AROM     PT LONG TERM GOAL #3   Title  Patient to  demonstrate >=4+/5 strength in B shoulders.    Time  8    Period  Weeks    Status  Partially Met   met for L shoulder, R shoulder still limited in all planes d/t pain     PT LONG TERM GOAL #4   Title  Patient to report tolerance for 4 hours of supine positioning without pain limiting.     Time  8    Period  Weeks    Status  Achieved   reports that he is able to lay supine for unlimited amount of time     PT LONG TERM GOAL #5   Title  Patient to report return to playing tennis without limitations or pain.    Time  8    Period  Weeks    Status  Partially Met   reports playing tennis with R shoulder pain most limiting, but difficulty looking overhead for a "high ball" with 8.5/10 pain           Plan - 10/14/18 0856    Clinical Impression Statement  Patient arrived to session with report of 80% improvement in neck symptoms- with R shoulder now most limiting to him. Still reporting intermittent HAs and difficulty driving d/t cervical limitations. Patient has demonstrated improvements in cervical AROM in all planes since initial eval. Strength goal met for L shoulder, R shoulder still limited in all planes d/t pain. Patient has achieved supine positioning goal- reporting that he is able to tolerate supine without limitations. Still reports neck and shoulder pain with tennis, which he hopes to return to soon. Patient reporting ram's horn HA over R side after objective measurements- typical for suboccipital tightness. Focused on cervical stretching and ROM within tolerance to relieve pain. Offered form correction during cervical retractions and added light banded resistance. Updated HEP with exercises that were well-tolerated today. Patient reported understanding. Ended session with [employer] to R shoulder d/t pain. No further complaints at end of session. Patient showing good improvements in cervical pain and limitations thus far, would benefit from continued skilled PT services to address  remaining impairments.    Comorbidities  OA, LBP, HLD, heart murmur, asthma, DM, CAD, anemia, ADD, achilles tenotomy, L heart cath & angio 06/2017, coronary stent 06/2017, C4-6 fusion    PT Treatment/Interventions  ADLs/Self Care Home Management;Cryotherapy;Electrical Stimulation;Ultrasound;Moist Heat;Therapeutic activities;Therapeutic exercise;Neuromuscular re-education;Patient/family education;Passive range of motion;Manual techniques;Dry needling;Energy conservation;Taping    PT Next Visit Plan  postural/shoulder strengthening activities; MT to address cervical, upper shoulder musculature; posture and body mechanics instruction     Consulted and Agree with Plan of Care  Patient       Patient will benefit from skilled therapeutic intervention in order to improve the following deficits and impairments:  Hypomobility, Decreased activity tolerance, Decreased strength, Impaired UE functional use, Pain, Increased muscle spasms, Decreased range of motion, Improper body mechanics, Postural dysfunction, Impaired flexibility  Visit Diagnosis: Cervicalgia - Plan:   Other symptoms and signs involving the musculoskeletal system - Plan:   Abnormal posture - Plan:   Muscle weakness (generalized) - Plan:      Problem List Patient Active Problem List   Diagnosis Date Noted  . Onychomycosis 05/25/2018  . Palpitations 07/06/2017  . CAD (coronary artery disease) 07/05/2017  . Family history of early CAD 07/05/2017  . Abnormal findings on diagnostic imaging  of cardiovascular system 06/23/2017  . Low vitamin D level 04/22/2017  . Pain in right ankle and joints of right foot 10/05/2016  . Hemarthrosis of right elbow 02/03/2016  . Small thenar eminence 02/03/2016  . Cervical disc disorder with radiculopathy of cervical region 10/07/2015  . Incomplete rotator cuff tear 09/10/2015  . Nonischemic cardiomyopathy ([place]) 05/15/2015  . Fatigue 05/15/2015  . Dyspnea on exertion 05/15/2015  . Decreased  cardiac ejection fraction 04/02/2015  . Strain of latissimus dorsi muscle 01/21/2015  . Shoulder bursitis 10/12/2014  . Stenosis of lateral recess of lumbar spine 10/12/2014  . BPPV (benign paroxysmal positional vertigo) 08/14/2014  . Hyperlipidemia 01/19/2014  . Anemia, unspecified 01/19/2014  . ADD (attention deficit disorder) 01/19/2014  . Type 2 diabetes mellitus ([place]) 12/22/2013  . Hypogonadism in male 12/22/2013  . Arthritis of hand, left 12/22/2013     Janene Harvey, PT, DPT 10/14/18 11:46 AM   [facility] [address]  [place], [place], [address] Phone: [phone]   Fax:  [phone]  Name: BASSAM DRESCH MRN: [id number] Date of Birth: [birth year]

## 2018-10-16 NOTE — Assessment & Plan Note (Signed)
Seems to have exacerbated with playing with a different tennis racket. Does endorse pain locally with the other pain down the arm. Bursa was observed on [place].  - subacromial injection  - counseled on HEP and supportive care - if no improvement consider imaging or other injection or PT

## 2018-10-16 NOTE — Assessment & Plan Note (Signed)
Acute on chronic symptoms at this time. Atrophy appears chronic. Prior EMG done in 2017 with multi level cervical fusion. Weakness seems more pronounced. Recent MRI doesn't suggest impingement at the neck. Has had an ulnar nerve transposition.  - baclofen  - counseled on HEP and supportive care - can consider EMG or trigger point injections

## 2018-10-18 ENCOUNTER — Ambulatory Visit: Payer: 59 | Admitting: Physical Therapy

## 2018-10-18 ENCOUNTER — Other Ambulatory Visit: Payer: Self-pay

## 2018-10-18 ENCOUNTER — Encounter: Payer: Self-pay | Admitting: Physical Therapy

## 2018-10-18 DIAGNOSIS — R293 Abnormal posture: Secondary | ICD-10-CM

## 2018-10-18 DIAGNOSIS — M542 Cervicalgia: Secondary | ICD-10-CM

## 2018-10-18 DIAGNOSIS — R29898 Other symptoms and signs involving the musculoskeletal system: Secondary | ICD-10-CM | POA: Diagnosis not present

## 2018-10-18 DIAGNOSIS — M6281 Muscle weakness (generalized): Secondary | ICD-10-CM

## 2018-10-18 NOTE — Therapy (Signed)
[facility] [address]  [place], [place], [address] Phone: [phone]   Fax:  [phone]  Physical Therapy Treatment  Patient Details  Name: Reginald Rios MRN: [id number] Date of Birth: Aug 14, 1955 Referring Provider (PT): Kristeen Miss, MD   Encounter Date: 10/18/2018  PT End of Session - 10/18/18 0947    Visit Number  11    Number of Visits  17    Date for PT Re-Evaluation  11/09/18    Authorization Type  [facility]    PT Start Time  0901    PT Stop Time  0948    PT Time Calculation (min)  47 min    Activity Tolerance  Patient tolerated treatment well;Patient limited by pain    Behavior During Therapy  [facility] for tasks assessed/performed       Past Medical History:  Diagnosis Date  . ADD (attention deficit disorder)   . Anemia   . Arthritis   . CAD (coronary artery disease)    2/19 PCI/DESx1 to Lcx  . Diabetes mellitus without complication ([place])   . Exercise-induced asthma   . Heart murmur   . Hyperlipidemia    family hx of high cholesterol  . Hypogonadism in male   . Low back pain   . Osteoarthritis     Past Surgical History:  Procedure Laterality Date  . CORONARY STENT INTERVENTION N/A 06/23/2017   Procedure: CORONARY STENT INTERVENTION;  Surgeon: Leonie Man, MD;  Location: [place] [facility];  Service: Cardiovascular;  Laterality: N/A;  . INTRAVASCULAR PRESSURE WIRE/FFR STUDY N/A 06/23/2017   Procedure: INTRAVASCULAR PRESSURE WIRE/FFR STUDY;  Surgeon: Leonie Man, MD;  Location: [place] [facility];  Service: Cardiovascular;  Laterality: N/A;  . KNEE ARTHROSCOPY    . LEFT HEART CATH AND CORONARY ANGIOGRAPHY N/A 06/23/2017   Procedure: LEFT HEART CATH AND CORONARY ANGIOGRAPHY;  Surgeon: Leonie Man, MD;  Location: [place] [facility];  Service: Cardiovascular;  Laterality: N/A;  . TENOTOMY ACHILLES TENDON      There were no vitals filed for this visit.  Subjective Assessment -  10/18/18 0902    Subjective  Reports that he is bummed because he found a leak in his shower. Reports that he has tried to do his HEP and had shoulder pain.    Pertinent History  OA, LBP, HLD, heart murmur, asthma, DM, CAD, anemia, ADD, achilles tenotomy, L heart cath & angio 06/2017, coronary stent 06/2017, C4-6 fusion     Diagnostic tests  09/04/18 cervical MRI: No acute osseous injury of the cervical spine. Anterior cervical fusion from C4-C6. Moderate B foraminal stenosis at C4-5. Likely mild myelomalacia C5-6. At C3-4 there is moderate left facet arthropathy and left uncovertebral degenerative changes. Severe L foraminal stenosis.     Patient Stated Goals  "I'd like the pain to stop"    Currently in Pain?  Yes    Pain Score  8     Pain Location  Shoulder    Pain Orientation  Right    Pain Descriptors / Indicators  Aching    Pain Type  Acute pain    Pain Radiating Towards  radiating up R side of neck         [facility] PT Assessment - 10/18/18 0001      Strength   Right Shoulder Flexion  4+/5    Right Shoulder ABduction  3+/5    Right Shoulder Internal Rotation  3+/5  Right Shoulder External Rotation  3+/5    Left Shoulder Flexion  5/5    Left Shoulder ABduction  5/5    Left Shoulder Internal Rotation  5/5    Left Shoulder External Rotation  4+/5                   [facility] Adult PT Treatment/Exercise - 10/18/18 0001      Neck Exercises: Machines for Strengthening   UBE (Upper Arm Bike)  Lvl 1.0, 3 min forward/3 min backwards       Shoulder Exercises: Seated   External Rotation  AAROM;Right;10 reps    External Rotation Limitations  wand; sitting with palms up; to tolerance    Internal Rotation  AAROM;Right;10 reps    Internal Rotation Limitations  wand; sitting with palms up; to tolerance    Abduction  AAROM;Right;10 reps    ABduction Limitations  wand; to tolerance   heavy manual and verbal cues for form and posture     Shoulder Exercises: Standing   External  Rotation  Strengthening;Right;10 reps;Theraband    Theraband Level (Shoulder External Rotation)  Level 1 (Yellow)    External Rotation Limitations  dowel under elbow; cues for alignment    Internal Rotation  Strengthening;Right;10 reps;Theraband    Theraband Level (Shoulder Internal Rotation)  Level 1 (Yellow)    Internal Rotation Limitations  dowel under elbow; cues for alignment    Row  Strengthening;Both;Theraband;15 reps    Theraband Level (Shoulder Row)  Level 2 (Red)    Row Limitations  manual cues for shoulder depression'      Shoulder Exercises: IT sales professional  2 reps;30 seconds    Corner Stretch Limitations  low pec stretch to tolerance      Manual Therapy   Manual Therapy  Soft tissue mobilization;Myofascial release    Manual therapy comments  sitting    Soft tissue mobilization  STM to R UT, LS, infraspinatus, pec- TTP and increased tone in all of these areas    Myofascial Release  manual TPR to R UT, LS, infraspinatus             PT Education - 10/18/18 [phone]    Education Details  provided info on personal TENS unit for pain relief; update to HEP; edu on importance of maintaining proper posture and avoiding repetitive overhead activities    Person(s) Educated  Patient    Methods  Explanation;Demonstration;Tactile cues;Verbal cues;Handout    Comprehension  Verbalized understanding;Returned demonstration       PT Short Term Goals - 10/14/18 0809      PT SHORT TERM GOAL #1   Title  Patient to be independent with initial HEP.    Time  4    Period  Weeks    Status  Achieved    Target Date  10/12/18        PT Long Term Goals - 10/14/18 0809      PT LONG TERM GOAL #1   Title  Patient to be independent with advanced HEP.    Time  8    Period  Weeks    Status  Partially Met   met for current     PT LONG TERM GOAL #2   Title  Patient to demonstrate [facility] and pain-free cervical AROM.    Time  8    Period  Weeks    Status  Partially Met    demonstrating significant improvement in all cervical AROM  PT LONG TERM GOAL #3   Title  Patient to demonstrate >=4+/5 strength in B shoulders.    Time  8    Period  Weeks    Status  Partially Met   met for L shoulder, R shoulder still limited in all planes d/t pain     PT LONG TERM GOAL #4   Title  Patient to report tolerance for 4 hours of supine positioning without pain limiting.     Time  8    Period  Weeks    Status  Achieved   reports that he is able to lay supine for unlimited amount of time     PT LONG TERM GOAL #5   Title  Patient to report return to playing tennis without limitations or pain.    Time  8    Period  Weeks    Status  Partially Met   reports playing tennis with R shoulder pain most limiting, but difficulty looking overhead for a "high ball" with 8.5/10 pain           Plan - 10/18/18 0948    Clinical Impression Statement  Patient arrived to session with report of continued R shoulder pain. Began session with review of R shoulder AAROM with wand with VC's/TC's to correct form and posture. Patient reporting improved tolerance after cues given. Able to tolerate progressive periscapular and RTC strengthening with banded resistance with mild c/o tightness in R pec. Patient still requiring consistent cues for shoulder depression and upright posture with ther-ex. Patient reporting benefit from use of TENS- d/t patient's decreased R shoulder strength (see measurements carried over from last session) and continued pain in R neck and shoulder, would benefit from home TENS unit. Ended session with manual therapy to R UT, LS, infraspinatus, and pec- patient reporting mild relief of pain and tightness. No further complaints at end of session.    Comorbidities  OA, LBP, HLD, heart murmur, asthma, DM, CAD, anemia, ADD, achilles tenotomy, L heart cath & angio 06/2017, coronary stent 06/2017, C4-6 fusion    PT Treatment/Interventions  ADLs/Self Care Home  Management;Cryotherapy;Electrical Stimulation;Ultrasound;Moist Heat;Therapeutic activities;Therapeutic exercise;Neuromuscular re-education;Patient/family education;Passive range of motion;Manual techniques;Dry needling;Energy conservation;Taping    PT Next Visit Plan  postural/shoulder strengthening activities; MT to address cervical, upper shoulder musculature; posture and body mechanics instruction     Consulted and Agree with Plan of Care  Patient       Patient will benefit from skilled therapeutic intervention in order to improve the following deficits and impairments:  Hypomobility, Decreased activity tolerance, Decreased strength, Impaired UE functional use, Pain, Increased muscle spasms, Decreased range of motion, Improper body mechanics, Postural dysfunction, Impaired flexibility  Visit Diagnosis: 1. Cervicalgia   2. Other symptoms and signs involving the musculoskeletal system   3. Abnormal posture   4. Muscle weakness (generalized)        Problem List Patient Active Problem List   Diagnosis Date Noted  . Onychomycosis 05/25/2018  . Palpitations 07/06/2017  . CAD (coronary artery disease) 07/05/2017  . Family history of early CAD 07/05/2017  . Abnormal findings on diagnostic imaging of cardiovascular system 06/23/2017  . Low vitamin D level 04/22/2017  . Pain in right ankle and joints of right foot 10/05/2016  . Hemarthrosis of right elbow 02/03/2016  . Small thenar eminence 02/03/2016  . Cervical disc disorder with radiculopathy of cervical region 10/07/2015  . Incomplete rotator cuff tear 09/10/2015  . Nonischemic cardiomyopathy ([place]) 05/15/2015  . Fatigue 05/15/2015  .  Dyspnea on exertion 05/15/2015  . Decreased cardiac ejection fraction 04/02/2015  . Strain of latissimus dorsi muscle 01/21/2015  . Shoulder bursitis 10/12/2014  . Stenosis of lateral recess of lumbar spine 10/12/2014  . BPPV (benign paroxysmal positional vertigo) 08/14/2014  . Hyperlipidemia  01/19/2014  . Anemia, unspecified 01/19/2014  . ADD (attention deficit disorder) 01/19/2014  . Type 2 diabetes mellitus ([place]) 12/22/2013  . Hypogonadism in male 12/22/2013  . Arthritis of hand, left 12/22/2013    Janene Harvey, PT, DPT 10/18/18 9:55 AM    [facility] [address]  [place], [place], [address] Phone: [phone]   Fax:  [phone]  Name: Reginald Rios MRN: [id number] Date of Birth: [birth year]

## 2018-10-21 ENCOUNTER — Ambulatory Visit: Payer: 59

## 2018-10-25 ENCOUNTER — Encounter: Payer: Self-pay | Admitting: Physical Therapy

## 2018-10-25 ENCOUNTER — Other Ambulatory Visit: Payer: Self-pay

## 2018-10-25 ENCOUNTER — Ambulatory Visit: Payer: 59 | Admitting: Physical Therapy

## 2018-10-25 DIAGNOSIS — R29898 Other symptoms and signs involving the musculoskeletal system: Secondary | ICD-10-CM | POA: Diagnosis not present

## 2018-10-25 DIAGNOSIS — M542 Cervicalgia: Secondary | ICD-10-CM | POA: Diagnosis not present

## 2018-10-25 DIAGNOSIS — R293 Abnormal posture: Secondary | ICD-10-CM

## 2018-10-25 DIAGNOSIS — M6281 Muscle weakness (generalized): Secondary | ICD-10-CM

## 2018-10-25 MED FILL — PRAVASTATIN NA 20 MG TAB: 20 | 90 days supply | Qty: 90 | Fill #1

## 2018-10-25 MED FILL — METHYLPHENIDATE 20 MG TAB: 20 | 30 days supply | Qty: 30 | Fill #0

## 2018-10-25 NOTE — Patient Instructions (Signed)
        Access Code: [email or web address]  URL: [email or web address]

## 2018-10-25 NOTE — Therapy (Signed)
[facility] [address]  [place], [place], [address] Phone: [phone]   Fax:  [phone]  Physical Therapy Treatment  Patient Details  Name: Reginald Rios MRN: [id number] Date of Birth: [birth year] Referring Provider (PT): Kristeen Miss, MD   Encounter Date: 10/25/2018  PT End of Session - 10/25/18 0851    Visit Number  12    Number of Visits  17    Date for PT Re-Evaluation  11/09/18    Authorization Type  [facility]    PT Start Time  0803    PT Stop Time  0856    PT Time Calculation (min)  53 min    Activity Tolerance  Patient tolerated treatment well;Patient limited by pain    Behavior During Therapy  [facility] for tasks assessed/performed       Past Medical History:  Diagnosis Date  . ADD (attention deficit disorder)   . Anemia   . Arthritis   . CAD (coronary artery disease)    2/19 PCI/DESx1 to Lcx  . Diabetes mellitus without complication ([place])   . Exercise-induced asthma   . Heart murmur   . Hyperlipidemia    family hx of high cholesterol  . Hypogonadism in male   . Low back pain   . Osteoarthritis     Past Surgical History:  Procedure Laterality Date  . CORONARY STENT INTERVENTION N/A 06/23/2017   Procedure: CORONARY STENT INTERVENTION;  Surgeon: Leonie Man, MD;  Location: [place] [facility];  Service: Cardiovascular;  Laterality: N/A;  . INTRAVASCULAR PRESSURE WIRE/FFR STUDY N/A 06/23/2017   Procedure: INTRAVASCULAR PRESSURE WIRE/FFR STUDY;  Surgeon: Leonie Man, MD;  Location: [place] [facility];  Service: Cardiovascular;  Laterality: N/A;  . KNEE ARTHROSCOPY    . LEFT HEART CATH AND CORONARY ANGIOGRAPHY N/A 06/23/2017   Procedure: LEFT HEART CATH AND CORONARY ANGIOGRAPHY;  Surgeon: Leonie Man, MD;  Location: [place] [facility];  Service: Cardiovascular;  Laterality: N/A;  . TENOTOMY ACHILLES TENDON      There were no vitals filed for this visit.  Subjective Assessment -  10/25/18 0804    Subjective  Reports that his neck is doing great overall- a little stiff this AM. R shoulder pain is still the same and with radiation down the arm.    Pertinent History  OA, LBP, HLD, heart murmur, asthma, DM, CAD, anemia, ADD, achilles tenotomy, L heart cath & angio 06/2017, coronary stent 06/2017, C4-6 fusion     Diagnostic tests  09/04/18 cervical MRI: No acute osseous injury of the cervical spine. Anterior cervical fusion from C4-C6. Moderate B foraminal stenosis at C4-5. Likely mild myelomalacia C5-6. At C3-4 there is moderate left facet arthropathy and left uncovertebral degenerative changes. Severe L foraminal stenosis.     Patient Stated Goals  "I'd like the pain to stop"    Currently in Pain?  Yes    Pain Score  7     Pain Location  Shoulder    Pain Orientation  Right    Pain Descriptors / Indicators  Aching    Pain Type  Acute pain                       [facility] Adult PT Treatment/Exercise - 10/25/18 0001      Neck Exercises: Machines for Strengthening   UBE (Upper Arm Bike)  Lvl 1.5, 3 min forward/3 min backwards  Neck Exercises: Seated   Neck Retraction  10 reps;3 secs   yellow band behind head   Neck Retraction Limitations  2x10    Other Seated Exercise  cervical rotation SNAG x10 each side to tolerance    Other Seated Exercise  cervical extension SNAG to tolerance 10x   mild correction for form     Shoulder Exercises: Standing   Row  Strengthening;Both;Theraband;15 reps    Theraband Level (Shoulder Row)  Level 3 (Green)    Row Limitations  2x10; manual cues for scap retraction and cervical retraction    Other Standing Exercises  B horizontal abduction with yellow TB at doorframe x10    Other Standing Exercises  B ER with yellow TB at door frame 2x10   moderate R shoulder pain      Shoulder Exercises: Stretch   Corner Stretch  2 reps;30 seconds    Corner Stretch Limitations  low pec stretch to tolerance      Vasopneumatic    Number Minutes Vasopneumatic   15 minutes    Vasopnuematic Location   Shoulder   R   Vasopneumatic Pressure  Low    Vasopneumatic Temperature   coldest             PT Education - 10/25/18 0851    Education Details  discussion, review, and consolidation of HEP    Person(s) Educated  Patient    Methods  Explanation;Demonstration;Tactile cues;Verbal cues;Handout    Comprehension  Verbalized understanding;Returned demonstration       PT Short Term Goals - 10/14/18 0809      PT SHORT TERM GOAL #1   Title  Patient to be independent with initial HEP.    Time  4    Period  Weeks    Status  Achieved    Target Date  10/12/18        PT Long Term Goals - 10/14/18 0809      PT LONG TERM GOAL #1   Title  Patient to be independent with advanced HEP.    Time  8    Period  Weeks    Status  Partially Met   met for current     PT LONG TERM GOAL #2   Title  Patient to demonstrate [facility] and pain-free cervical AROM.    Time  8    Period  Weeks    Status  Partially Met   demonstrating significant improvement in all cervical AROM     PT LONG TERM GOAL #3   Title  Patient to demonstrate >=4+/5 strength in B shoulders.    Time  8    Period  Weeks    Status  Partially Met   met for L shoulder, R shoulder still limited in all planes d/t pain     PT LONG TERM GOAL #4   Title  Patient to report tolerance for 4 hours of supine positioning without pain limiting.     Time  8    Period  Weeks    Status  Achieved   reports that he is able to lay supine for unlimited amount of time     PT LONG TERM GOAL #5   Title  Patient to report return to playing tennis without limitations or pain.    Time  8    Period  Weeks    Status  Partially Met   reports playing tennis with R shoulder pain most limiting, but difficulty looking overhead for a "high ball"  with 8.5/10 pain           Plan - 10/25/18 0853    Clinical Impression Statement  Patient arrived to session with report of  improvement in neck pain, with R shoulder pain remaining. Reporting that he felt achy the night after last session, but better the next day. Worked on cervical retractions with light banded resistance- patient with good form and tolerating this well. Patient reporting improvement in cervical ROM with SNAGS; still with observable limitation in extension. Increased banded resistance with rows - patient requiring cues for scapular and cervical retraction. Ended session with [place] to R shoulder for pain relief- patient still reporting mild "achiness and stiffness" after modality. Reviewed and consolidated previously administered HEP handouts for improvement carryover and consistency with exercises. Patient reported understanding.    Comorbidities  OA, LBP, HLD, heart murmur, asthma, DM, CAD, anemia, ADD, achilles tenotomy, L heart cath & angio 06/2017, coronary stent 06/2017, C4-6 fusion    PT Treatment/Interventions  ADLs/Self Care Home Management;Cryotherapy;Electrical Stimulation;Ultrasound;Moist Heat;Therapeutic activities;Therapeutic exercise;Neuromuscular re-education;Patient/family education;Passive range of motion;Manual techniques;Dry needling;Energy conservation;Taping    PT Next Visit Plan  postural/shoulder strengthening activities; MT to address cervical, upper shoulder musculature; posture and body mechanics instruction     Consulted and Agree with Plan of Care  Patient       Patient will benefit from skilled therapeutic intervention in order to improve the following deficits and impairments:  Hypomobility, Decreased activity tolerance, Decreased strength, Impaired UE functional use, Pain, Increased muscle spasms, Decreased range of motion, Improper body mechanics, Postural dysfunction, Impaired flexibility  Visit Diagnosis: 1. Cervicalgia   2. Other symptoms and signs involving the musculoskeletal system   3. Abnormal posture   4. Muscle weakness (generalized)        Problem  List Patient Active Problem List   Diagnosis Date Noted  . Onychomycosis 05/25/2018  . Palpitations 07/06/2017  . CAD (coronary artery disease) 07/05/2017  . Family history of early CAD 07/05/2017  . Abnormal findings on diagnostic imaging of cardiovascular system 06/23/2017  . Low vitamin D level 04/22/2017  . Pain in right ankle and joints of right foot 10/05/2016  . Hemarthrosis of right elbow 02/03/2016  . Small thenar eminence 02/03/2016  . Cervical disc disorder with radiculopathy of cervical region 10/07/2015  . Incomplete rotator cuff tear 09/10/2015  . Nonischemic cardiomyopathy ([place]) 05/15/2015  . Fatigue 05/15/2015  . Dyspnea on exertion 05/15/2015  . Decreased cardiac ejection fraction 04/02/2015  . Strain of latissimus dorsi muscle 01/21/2015  . Shoulder bursitis 10/12/2014  . Stenosis of lateral recess of lumbar spine 10/12/2014  . BPPV (benign paroxysmal positional vertigo) 08/14/2014  . Hyperlipidemia 01/19/2014  . Anemia, unspecified 01/19/2014  . ADD (attention deficit disorder) 01/19/2014  . Type 2 diabetes mellitus ([place]) 12/22/2013  . Hypogonadism in male 12/22/2013  . Arthritis of hand, left 12/22/2013    Janene Harvey, PT, DPT 10/25/18 8:59 AM    [facility] [address]  [place], [place], [address] Phone: [phone]   Fax:  [phone]  Name: JULEN RUBERT MRN: [id number] Date of Birth: 05-Mar-1956

## 2018-10-28 ENCOUNTER — Other Ambulatory Visit: Payer: Self-pay

## 2018-10-28 ENCOUNTER — Encounter: Payer: Self-pay | Admitting: Physical Therapy

## 2018-10-28 ENCOUNTER — Ambulatory Visit: Payer: 59 | Admitting: Physical Therapy

## 2018-10-28 DIAGNOSIS — M542 Cervicalgia: Secondary | ICD-10-CM | POA: Diagnosis not present

## 2018-10-28 DIAGNOSIS — M6281 Muscle weakness (generalized): Secondary | ICD-10-CM | POA: Diagnosis not present

## 2018-10-28 DIAGNOSIS — R293 Abnormal posture: Secondary | ICD-10-CM | POA: Diagnosis not present

## 2018-10-28 DIAGNOSIS — R29898 Other symptoms and signs involving the musculoskeletal system: Secondary | ICD-10-CM | POA: Diagnosis not present

## 2018-10-28 NOTE — Therapy (Signed)
[facility] [address]  [place], [place], [address] Phone: [phone]   Fax:  [phone]  Physical Therapy Treatment  Patient Details  Name: Reginald Rios MRN: [id number] Date of Birth: 15-Mar-1956 Referring Provider (PT): Kristeen Miss, MD   Encounter Date: 10/28/2018  PT End of Session - 10/28/18 0855    Visit Number  13    Number of Visits  17    Date for PT Re-Evaluation  11/09/18    Authorization Type  [facility]    PT Start Time  0801    PT Stop Time  0900    PT Time Calculation (min)  59 min    Activity Tolerance  Patient tolerated treatment well;Patient limited by pain    Behavior During Therapy  [facility] for tasks assessed/performed       Past Medical History:  Diagnosis Date  . ADD (attention deficit disorder)   . Anemia   . Arthritis   . CAD (coronary artery disease)    2/19 PCI/DESx1 to Lcx  . Diabetes mellitus without complication ([place])   . Exercise-induced asthma   . Heart murmur   . Hyperlipidemia    family hx of high cholesterol  . Hypogonadism in male   . Low back pain   . Osteoarthritis     Past Surgical History:  Procedure Laterality Date  . CORONARY STENT INTERVENTION N/A 06/23/2017   Procedure: CORONARY STENT INTERVENTION;  Surgeon: Leonie Man, MD;  Location: [place] [facility];  Service: Cardiovascular;  Laterality: N/A;  . INTRAVASCULAR PRESSURE WIRE/FFR STUDY N/A 06/23/2017   Procedure: INTRAVASCULAR PRESSURE WIRE/FFR STUDY;  Surgeon: Leonie Man, MD;  Location: [place] [facility];  Service: Cardiovascular;  Laterality: N/A;  . KNEE ARTHROSCOPY    . LEFT HEART CATH AND CORONARY ANGIOGRAPHY N/A 06/23/2017   Procedure: LEFT HEART CATH AND CORONARY ANGIOGRAPHY;  Surgeon: Leonie Man, MD;  Location: [place] [facility];  Service: Cardiovascular;  Laterality: N/A;  . TENOTOMY ACHILLES TENDON      There were no vitals filed for this visit.  Subjective Assessment -  10/28/18 0802    Subjective  Reports that he did some painting with his R arm and now is having R shoulder pain down to his hand. Did his exercises in 26 minutes rather than hours but having pain with supine flexion AAROM.    Pertinent History  OA, LBP, HLD, heart murmur, asthma, DM, CAD, anemia, ADD, achilles tenotomy, L heart cath & angio 06/2017, coronary stent 06/2017, C4-6 fusion     Diagnostic tests  09/04/18 cervical MRI: No acute osseous injury of the cervical spine. Anterior cervical fusion from C4-C6. Moderate B foraminal stenosis at C4-5. Likely mild myelomalacia C5-6. At C3-4 there is moderate left facet arthropathy and left uncovertebral degenerative changes. Severe L foraminal stenosis.     Patient Stated Goals  "I'd like the pain to stop"    Currently in Pain?  Yes    Pain Score  8     Pain Location  Shoulder    Pain Orientation  Right    Pain Descriptors / Indicators  Aching    Pain Type  Acute pain                       [facility] Adult PT Treatment/Exercise - 10/28/18 0001      Neck Exercises: Machines for Strengthening   UBE (Upper Arm Bike)  Lvl 1.0,  3 min forward/3 min backwards       Shoulder Exercises: Seated   Flexion  Right;10 reps;AAROM    Flexion Limitations  with wand; scaption to tolerance   cues to avoid pushing into pain and upright posture     Vasopneumatic   Number Minutes Vasopneumatic   15 minutes    Vasopnuematic Location   Shoulder   R   Vasopneumatic Pressure  Low    Vasopneumatic Temperature   coldest             PT Education - 10/28/18 [id number]    Education Details  education and demonstration of use of FLEX-IT personal TENS unit- thorough edu on precautions and contraindications, electrode placement, wear time    Person(s) Educated  Patient    Methods  Explanation;Demonstration    Comprehension  Verbalized understanding;Returned demonstration       PT Short Term Goals - 10/14/18 0809      PT SHORT TERM GOAL #1   Title   Patient to be independent with initial HEP.    Time  4    Period  Weeks    Status  Achieved    Target Date  10/12/18        PT Long Term Goals - 10/14/18 0809      PT LONG TERM GOAL #1   Title  Patient to be independent with advanced HEP.    Time  8    Period  Weeks    Status  Partially Met   met for current     PT LONG TERM GOAL #2   Title  Patient to demonstrate [facility] and pain-free cervical AROM.    Time  8    Period  Weeks    Status  Partially Met   demonstrating significant improvement in all cervical AROM     PT LONG TERM GOAL #3   Title  Patient to demonstrate >=4+/5 strength in B shoulders.    Time  8    Period  Weeks    Status  Partially Met   met for L shoulder, R shoulder still limited in all planes d/t pain     PT LONG TERM GOAL #4   Title  Patient to report tolerance for 4 hours of supine positioning without pain limiting.     Time  8    Period  Weeks    Status  Achieved   reports that he is able to lay supine for unlimited amount of time     PT LONG TERM GOAL #5   Title  Patient to report return to playing tennis without limitations or pain.    Time  8    Period  Weeks    Status  Partially Met   reports playing tennis with R shoulder pain most limiting, but difficulty looking overhead for a "high ball" with 8.5/10 pain           Plan - 10/28/18 0855    Clinical Impression Statement  Patient arrived to session with increased R shoulder pain down to hand after painting with that hand yesterday. Patient continues to perform repetitive overhead movements with R arm despite specific advice against this.  Spent considerable time educating and demonstrating proper use, electrode placement, and discussing precautions/contraindications and wear time of personal TENS unit. Patient reported understanding. Reviewed sitting R shoulder AAROM with wand as patient reporting considerable pain with this exercise at home. Advised patient to change angle of motion and  perform scaption rather  than straight abduction, which was better tolerated. Ended session with [place] to R shoulder for pain relief. Still reporting some shoulder pain at end of session, no worse than beginning of session.    Comorbidities  OA, LBP, HLD, heart murmur, asthma, DM, CAD, anemia, ADD, achilles tenotomy, L heart cath & angio 06/2017, coronary stent 06/2017, C4-6 fusion    PT Treatment/Interventions  ADLs/Self Care Home Management;Cryotherapy;Electrical Stimulation;Ultrasound;Moist Heat;Therapeutic activities;Therapeutic exercise;Neuromuscular re-education;Patient/family education;Passive range of motion;Manual techniques;Dry needling;Energy conservation;Taping    PT Next Visit Plan  postural/shoulder strengthening activities; MT to address cervical, upper shoulder musculature; posture and body mechanics instruction     Consulted and Agree with Plan of Care  Patient       Patient will benefit from skilled therapeutic intervention in order to improve the following deficits and impairments:  Hypomobility, Decreased activity tolerance, Decreased strength, Impaired UE functional use, Pain, Increased muscle spasms, Decreased range of motion, Improper body mechanics, Postural dysfunction, Impaired flexibility  Visit Diagnosis: 1. Cervicalgia   2. Other symptoms and signs involving the musculoskeletal system   3. Abnormal posture   4. Muscle weakness (generalized)        Problem List Patient Active Problem List   Diagnosis Date Noted  . Onychomycosis 05/25/2018  . Palpitations 07/06/2017  . CAD (coronary artery disease) 07/05/2017  . Family history of early CAD 07/05/2017  . Abnormal findings on diagnostic imaging of cardiovascular system 06/23/2017  . Low vitamin D level 04/22/2017  . Pain in right ankle and joints of right foot 10/05/2016  . Hemarthrosis of right elbow 02/03/2016  . Small thenar eminence 02/03/2016  . Cervical disc disorder with radiculopathy of cervical  region 10/07/2015  . Incomplete rotator cuff tear 09/10/2015  . Nonischemic cardiomyopathy ([place]) 05/15/2015  . Fatigue 05/15/2015  . Dyspnea on exertion 05/15/2015  . Decreased cardiac ejection fraction 04/02/2015  . Strain of latissimus dorsi muscle 01/21/2015  . Shoulder bursitis 10/12/2014  . Stenosis of lateral recess of lumbar spine 10/12/2014  . BPPV (benign paroxysmal positional vertigo) 08/14/2014  . Hyperlipidemia 01/19/2014  . Anemia, unspecified 01/19/2014  . ADD (attention deficit disorder) 01/19/2014  . Type 2 diabetes mellitus ([place]) 12/22/2013  . Hypogonadism in male 12/22/2013  . Arthritis of hand, left 12/22/2013    Janene Harvey, PT, DPT 10/28/18 9:05 AM   [facility] [address]  [place], [place], [address] Phone: [phone]   Fax:  [phone]  Name: Reginald Rios MRN: [id number] Date of Birth: 09/02/1955

## 2018-11-01 ENCOUNTER — Other Ambulatory Visit: Payer: Self-pay

## 2018-11-01 ENCOUNTER — Telehealth: Payer: Self-pay | Admitting: Cardiology

## 2018-11-01 ENCOUNTER — Ambulatory Visit: Payer: 59

## 2018-11-01 DIAGNOSIS — M6281 Muscle weakness (generalized): Secondary | ICD-10-CM | POA: Diagnosis not present

## 2018-11-01 DIAGNOSIS — M542 Cervicalgia: Secondary | ICD-10-CM

## 2018-11-01 DIAGNOSIS — R293 Abnormal posture: Secondary | ICD-10-CM | POA: Diagnosis not present

## 2018-11-01 DIAGNOSIS — R29898 Other symptoms and signs involving the musculoskeletal system: Secondary | ICD-10-CM | POA: Diagnosis not present

## 2018-11-01 NOTE — Telephone Encounter (Signed)
Please advise. Thanks.  

## 2018-11-01 NOTE — Telephone Encounter (Signed)
Patient informed that he can use Voltaren cream per Dr. Bettina Gavia. Patient has been scheduled for an overdue 6 month follow up on Monday, 12/19/2018, at 3:40 pm in the [facility] office. Patient is agreeable and verbalized understanding. No further questions.

## 2018-11-01 NOTE — Patient Instructions (Signed)
TENS stands for Transcutaneous Electrical Nerve Stimulation. In other words, electrical impulses are allowed to pass through the skin in order to excite a nerve.  ° °Purpose and Use of TENS:  °TENS is a method used to manage acute and chronic pain without the use of drugs. It has been effective in managing pain associated with surgery, sprains, strains, trauma, rheumatoid arthritis, and neuralgias. It is a non-addictive, low risk, and non-invasive technique used to control pain. It is not, by any means, a curative form of treatment.  ° °How TENS Works:  °Most TENS units are a small pocket-sized unit powered by one 9 volt battery. Attached to the outside of the unit are two lead wires where two pins and/or snaps connect on each wire. All units come with a set of four reusable pads or electrodes. These are placed on the skin surrounding the area involved. By inserting the leads into  the pads, the electricity can pass from the unit making the circuit complete.  °As the intensity is turned up slowly, the electrical current enters the body from the electrodes through the skin to the surrounding nerve fibers. This triggers the release of hormones from within the body. These hormones contain pain relievers. By increasing the circulation of these hormones, the person’s pain may be lessened. It is also believed that the electrical stimulation itself helps to block the pain messages being sent to the brain, thus also decreasing the body’s perception of pain.  ° °Hazards:  °TENS units are NOT to be used by patients with PACEMAKERS, DEFIBRILLATORS, DIABETIC PUMPS, PREGNANT WOMEN, and patients with SEIZURE DISORDERS.  °TENS units are NOT to be used over the heart, throat, brain, or spinal cord.  °One of the major side effects from the TENS unit may be skin irritation. Some people may develop a rash if they are sensitive to the materials used in the electrodes or the connecting wires.  ° °Wear the unit for 15 min.  ° °Avoid  overuse due the body getting used to the stem making it not as effective over time.  ° °

## 2018-11-01 NOTE — Telephone Encounter (Signed)
Voltaren creme is fine. Often very helpful! He is overdue for 6 month follow up, please schedule in office visit.

## 2018-11-01 NOTE — Telephone Encounter (Signed)
Patient called to ask if he can use Voltaren Arthritis Pain Creme.  Please call patient to discuss [phone]

## 2018-11-01 NOTE — Therapy (Signed)
[facility] [address]  [place], [place], [address] Phone: [phone]   Fax:  [phone]  Physical Therapy Treatment  Patient Details  Name: Reginald Rios MRN: [id number] Date of Birth: 18-Jan-1956 Referring Provider (PT): Kristeen Miss, MD   Encounter Date: 11/01/2018  PT End of Session - 11/01/18 0829    Visit Number  14    Number of Visits  17    Date for PT Re-Evaluation  11/09/18    Authorization Type  [facility]    PT Start Time  0802    PT Stop Time  0850    PT Time Calculation (min)  48 min    Activity Tolerance  Patient tolerated treatment well;Patient limited by pain    Behavior During Therapy  [facility] for tasks assessed/performed       Past Medical History:  Diagnosis Date  . ADD (attention deficit disorder)   . Anemia   . Arthritis   . CAD (coronary artery disease)    2/19 PCI/DESx1 to Lcx  . Diabetes mellitus without complication ([place])   . Exercise-induced asthma   . Heart murmur   . Hyperlipidemia    family hx of high cholesterol  . Hypogonadism in male   . Low back pain   . Osteoarthritis     Past Surgical History:  Procedure Laterality Date  . CORONARY STENT INTERVENTION N/A 06/23/2017   Procedure: CORONARY STENT INTERVENTION;  Surgeon: Leonie Man, MD;  Location: [place] [facility];  Service: Cardiovascular;  Laterality: N/A;  . INTRAVASCULAR PRESSURE WIRE/FFR STUDY N/A 06/23/2017   Procedure: INTRAVASCULAR PRESSURE WIRE/FFR STUDY;  Surgeon: Leonie Man, MD;  Location: [place] [facility];  Service: Cardiovascular;  Laterality: N/A;  . KNEE ARTHROSCOPY    . LEFT HEART CATH AND CORONARY ANGIOGRAPHY N/A 06/23/2017   Procedure: LEFT HEART CATH AND CORONARY ANGIOGRAPHY;  Surgeon: Leonie Man, MD;  Location: [place] [facility];  Service: Cardiovascular;  Laterality: N/A;  . TENOTOMY ACHILLES TENDON      There were no vitals filed for this visit.  Subjective Assessment -  11/01/18 0806    Subjective  Pt. reporting, "my neck and shoulder are improving, now it's just my R arm pain that bothering me".    Pertinent History  OA, LBP, HLD, heart murmur, asthma, DM, CAD, anemia, ADD, achilles tenotomy, L heart cath & angio 06/2017, coronary stent 06/2017, C4-6 fusion     Diagnostic tests  09/04/18 cervical MRI: No acute osseous injury of the cervical spine. Anterior cervical fusion from C4-C6. Moderate B foraminal stenosis at C4-5. Likely mild myelomalacia C5-6. At C3-4 there is moderate left facet arthropathy and left uncovertebral degenerative changes. Severe L foraminal stenosis.     Patient Stated Goals  "I'd like the pain to stop"    Currently in Pain?  Yes    Pain Score  8     Pain Location  Arm    Pain Orientation  Right    Pain Descriptors / Indicators  Aching    Pain Type  Acute pain    Pain Radiating Towards  radiating down posterior forearm into 4th and 5th digits on R hand    Pain Onset  More than a month ago    Pain Frequency  Intermittent    Multiple Pain Sites  No  [facility] Adult PT Treatment/Exercise - 11/01/18 0001      Self-Care   Self-Care  Other Self-Care Comments    Other Self-Care Comments   Instruction on TENS IT unit electrode placement, proper setup, rationale for TENS use and purpose; pt. verbalized understanding with electrode placement handout referenced       Neck Exercises: Machines for Strengthening   UBE (Upper Arm Bike)  Lvl 1.5, 5 min forward/1 min backwards    1 min backwards as pt. noting more pain      Neck Exercises: Seated   Money  15 reps;3 secs    Money Limitations  no resistance focusing on scapular motion / retraction       Neck Exercises: Stretches   Chest Stretch  2 reps;30 seconds    Chest Stretch Limitations  low on doorway - cues required for pt. to hold longer than 3 sec as pt. reporting he was holding three seconds into stretch at home              PT Education -  11/01/18 0810    Education Details  TENS unit electrode placement handout    Person(s) Educated  Patient    Methods  Explanation;Demonstration;Verbal cues;Handout    Comprehension  Verbalized understanding;Returned demonstration;Verbal cues required       PT Short Term Goals - 10/14/18 0809      PT SHORT TERM GOAL #1   Title  Patient to be independent with initial HEP.    Time  4    Period  Weeks    Status  Achieved    Target Date  10/12/18        PT Long Term Goals - 10/14/18 0809      PT LONG TERM GOAL #1   Title  Patient to be independent with advanced HEP.    Time  8    Period  Weeks    Status  Partially Met   met for current     PT LONG TERM GOAL #2   Title  Patient to demonstrate [facility] and pain-free cervical AROM.    Time  8    Period  Weeks    Status  Partially Met   demonstrating significant improvement in all cervical AROM     PT LONG TERM GOAL #3   Title  Patient to demonstrate >=4+/5 strength in B shoulders.    Time  8    Period  Weeks    Status  Partially Met   met for L shoulder, R shoulder still limited in all planes d/t pain     PT LONG TERM GOAL #4   Title  Patient to report tolerance for 4 hours of supine positioning without pain limiting.     Time  8    Period  Weeks    Status  Achieved   reports that he is able to lay supine for unlimited amount of time     PT LONG TERM GOAL #5   Title  Patient to report return to playing tennis without limitations or pain.    Time  8    Period  Weeks    Status  Partially Met   reports playing tennis with R shoulder pain most limiting, but difficulty looking overhead for a "high ball" with 8.5/10 pain           Plan - 11/01/18 0844    Clinical Impression Statement  Reginald Rios primary complaint today is R arm/posterior forearm/R hand (4th, 5th digits)  pain which he states is nearly constant.  Notes some relief from use of TENS IT unit recently issued to pt. however had some questions about proper setup and  electrode placement today thus session focused on instruction.  Pt. able to verbalize understanding of TENS IT use following instruction today and issued handout diagram for improved understanding of electrode placement.  Remainder of session focused on postural exercise for anterior chest stretch and to improved scapular activation.  Pt. admitting to only performing chest stretch hold for three seconds thus corrected pt. technique.  Will continue to progress toward goals.    Personal Factors and Comorbidities  Age;Past/Current Experience;Comorbidity 3+;Time since onset of injury/illness/exacerbation;Fitness    Comorbidities  OA, LBP, HLD, heart murmur, asthma, DM, CAD, anemia, ADD, achilles tenotomy, L heart cath & angio 06/2017, coronary stent 06/2017, C4-6 fusion    Rehab Potential  Good    PT Treatment/Interventions  ADLs/Self Care Home Management;Cryotherapy;Electrical Stimulation;Ultrasound;Moist Heat;Therapeutic activities;Therapeutic exercise;Neuromuscular re-education;Patient/family education;Passive range of motion;Manual techniques;Dry needling;Energy conservation;Taping    PT Next Visit Plan  postural/shoulder strengthening activities; MT to address cervical, upper shoulder musculature; posture and body mechanics instruction     Consulted and Agree with Plan of Care  Patient       Patient will benefit from skilled therapeutic intervention in order to improve the following deficits and impairments:  Hypomobility, Decreased activity tolerance, Decreased strength, Impaired UE functional use, Pain, Increased muscle spasms, Decreased range of motion, Improper body mechanics, Postural dysfunction, Impaired flexibility  Visit Diagnosis: 1. Cervicalgia   2. Other symptoms and signs involving the musculoskeletal system   3. Abnormal posture   4. Muscle weakness (generalized)        Problem List Patient Active Problem List   Diagnosis Date Noted  . Onychomycosis 05/25/2018  . Palpitations  07/06/2017  . CAD (coronary artery disease) 07/05/2017  . Family history of early CAD 07/05/2017  . Abnormal findings on diagnostic imaging of cardiovascular system 06/23/2017  . Low vitamin D level 04/22/2017  . Pain in right ankle and joints of right foot 10/05/2016  . Hemarthrosis of right elbow 02/03/2016  . Small thenar eminence 02/03/2016  . Cervical disc disorder with radiculopathy of cervical region 10/07/2015  . Incomplete rotator cuff tear 09/10/2015  . Nonischemic cardiomyopathy ([place]) 05/15/2015  . Fatigue 05/15/2015  . Dyspnea on exertion 05/15/2015  . Decreased cardiac ejection fraction 04/02/2015  . Strain of latissimus dorsi muscle 01/21/2015  . Shoulder bursitis 10/12/2014  . Stenosis of lateral recess of lumbar spine 10/12/2014  . BPPV (benign paroxysmal positional vertigo) 08/14/2014  . Hyperlipidemia 01/19/2014  . Anemia, unspecified 01/19/2014  . ADD (attention deficit disorder) 01/19/2014  . Type 2 diabetes mellitus ([place]) 12/22/2013  . Hypogonadism in male 12/22/2013  . Arthritis of hand, left 12/22/2013    Bess Harvest, PTA 11/01/18 9:29 AM    [facility] [address]  [place], [place], [address] Phone: [phone]   Fax:  [phone]  Name: Reginald Rios MRN: [id number] Date of Birth: 11-28-1955

## 2018-11-03 ENCOUNTER — Ambulatory Visit: Payer: 59 | Attending: Neurological Surgery

## 2018-11-03 ENCOUNTER — Other Ambulatory Visit: Payer: Self-pay

## 2018-11-03 DIAGNOSIS — R29898 Other symptoms and signs involving the musculoskeletal system: Secondary | ICD-10-CM | POA: Diagnosis not present

## 2018-11-03 DIAGNOSIS — R293 Abnormal posture: Secondary | ICD-10-CM | POA: Insufficient documentation

## 2018-11-03 DIAGNOSIS — M542 Cervicalgia: Secondary | ICD-10-CM | POA: Diagnosis not present

## 2018-11-03 DIAGNOSIS — M6281 Muscle weakness (generalized): Secondary | ICD-10-CM | POA: Insufficient documentation

## 2018-11-03 NOTE — Therapy (Signed)
[facility] [address]  [place], [place], [address] Phone: [phone]   Fax:  [phone]  Physical Therapy Treatment  Patient Details  Name: Reginald Rios MRN: [id number] Date of Birth: 1955/06/10 Referring Provider (PT): Kristeen Miss, MD   Encounter Date: 11/03/2018  PT End of Session - 11/03/18 0812    Visit Number  15    Number of Visits  17    Date for PT Re-Evaluation  11/09/18    Authorization Type  [facility]    PT Start Time  0806    PT Stop Time  3825   ended session with 10 min moist heat   PT Time Calculation (min)  49 min    Activity Tolerance  Patient tolerated treatment well;Patient limited by pain    Behavior During Therapy  [facility] for tasks assessed/performed       Past Medical History:  Diagnosis Date  . ADD (attention deficit disorder)   . Anemia   . Arthritis   . CAD (coronary artery disease)    2/19 PCI/DESx1 to Lcx  . Diabetes mellitus without complication ([place])   . Exercise-induced asthma   . Heart murmur   . Hyperlipidemia    family hx of high cholesterol  . Hypogonadism in male   . Low back pain   . Osteoarthritis     Past Surgical History:  Procedure Laterality Date  . CORONARY STENT INTERVENTION N/A 06/23/2017   Procedure: CORONARY STENT INTERVENTION;  Surgeon: Leonie Man, MD;  Location: [place] [facility];  Service: Cardiovascular;  Laterality: N/A;  . INTRAVASCULAR PRESSURE WIRE/FFR STUDY N/A 06/23/2017   Procedure: INTRAVASCULAR PRESSURE WIRE/FFR STUDY;  Surgeon: Leonie Man, MD;  Location: [place] [facility];  Service: Cardiovascular;  Laterality: N/A;  . KNEE ARTHROSCOPY    . LEFT HEART CATH AND CORONARY ANGIOGRAPHY N/A 06/23/2017   Procedure: LEFT HEART CATH AND CORONARY ANGIOGRAPHY;  Surgeon: Leonie Man, MD;  Location: [place] [facility];  Service: Cardiovascular;  Laterality: N/A;  . TENOTOMY ACHILLES TENDON      There were no vitals filed for this  visit.  Subjective Assessment - 11/03/18 0811    Subjective  Pt. reporting increased R arm pain down into forearm after last sesion without known trigger.  Had difficulty relieving this pain with TENS unit.  Felt better later that night of session.    Pertinent History  OA, LBP, HLD, heart murmur, asthma, DM, CAD, anemia, ADD, achilles tenotomy, L heart cath & angio 06/2017, coronary stent 06/2017, C4-6 fusion     Diagnostic tests  09/04/18 cervical MRI: No acute osseous injury of the cervical spine. Anterior cervical fusion from C4-C6. Moderate B foraminal stenosis at C4-5. Likely mild myelomalacia C5-6. At C3-4 there is moderate left facet arthropathy and left uncovertebral degenerative changes. Severe L foraminal stenosis.     Patient Stated Goals  "I'd like the pain to stop"    Currently in Pain?  Yes    Pain Score  7     Pain Location  Arm    Pain Orientation  Right    Pain Descriptors / Indicators  Aching    Pain Type  Acute pain    Pain Radiating Towards  radiating down posterior forearm into 4th and 5th digits on R hand    Pain Onset  More than a month ago    Pain Frequency  Intermittent    Aggravating Factors   unsure  Pain Relieving Factors  moist heat, TENS unit    Multiple Pain Sites  No                       [facility] Adult PT Treatment/Exercise - 11/03/18 0001      Self-Care   Self-Care  Other Self-Care Comments    Other Self-Care Comments   Instruction on self-pec ball release on wall       Neck Exercises: Theraband   Shoulder Extension  10 reps;Red    Shoulder Extension Limitations  3" scapular retractions    Rows  15 reps;Red      Neck Exercises: Seated   Shoulder Rolls  Backwards;15 reps    Other Seated Exercise  scaplar retraction 5" x 10 reps     Other Seated Exercise  R ulnar nerve glide x 5 rpes - terminated after 5 reps due to increased numbness      Moist Heat Therapy   Number Minutes Moist Heat  10 Minutes    Moist Heat Location  Shoulder    R pec     Manual Therapy   Manual Therapy  Soft tissue mobilization;Myofascial release    Manual therapy comments  supine     Soft tissue mobilization  STM to R pec major     Myofascial Release  TPR to R pec major     Passive ROM  manual R pec stretch in varying positions              PT Education - 11/03/18 0852    Education Details  pec ball on wall release    Person(s) Educated  Patient    Methods  Explanation;Demonstration;Verbal cues;Handout    Comprehension  Verbalized understanding       PT Short Term Goals - 10/14/18 0809      PT SHORT TERM GOAL #1   Title  Patient to be independent with initial HEP.    Time  4    Period  Weeks    Status  Achieved    Target Date  10/12/18        PT Long Term Goals - 10/14/18 0809      PT LONG TERM GOAL #1   Title  Patient to be independent with advanced HEP.    Time  8    Period  Weeks    Status  Partially Met   met for current     PT LONG TERM GOAL #2   Title  Patient to demonstrate [facility] and pain-free cervical AROM.    Time  8    Period  Weeks    Status  Partially Met   demonstrating significant improvement in all cervical AROM     PT LONG TERM GOAL #3   Title  Patient to demonstrate >=4+/5 strength in B shoulders.    Time  8    Period  Weeks    Status  Partially Met   met for L shoulder, R shoulder still limited in all planes d/t pain     PT LONG TERM GOAL #4   Title  Patient to report tolerance for 4 hours of supine positioning without pain limiting.     Time  8    Period  Weeks    Status  Achieved   reports that he is able to lay supine for unlimited amount of time     PT LONG TERM GOAL #5   Title  Patient to report return to  playing tennis without limitations or pain.    Time  8    Period  Weeks    Status  Partially Met   reports playing tennis with R shoulder pain most limiting, but difficulty looking overhead for a "high ball" with 8.5/10 pain           Plan - 11/03/18 0813    Clinical  Impression Statement  Maxximus noticing anterior shoulder/pec pain while pushing off surface to perform sit<>stand.  MT addressing palpable TP in R lateral pec major with pt. noticing much improved comfort with sit>stand following.  Session focusing on scapular/postural strengthening activities and anterior chest stretch for reduction in cervical strain.  Pt. issued handout for pec muscle self-ball release on wall as he noted benefit from manual release in session.  Ended session with moist heat to R anterior shoulder and pec with pt. leaving session pain free.  Will continue to progress toward goals.    Personal Factors and Comorbidities  Age;Past/Current Experience;Comorbidity 3+;Time since onset of injury/illness/exacerbation;Fitness    Comorbidities  OA, LBP, HLD, heart murmur, asthma, DM, CAD, anemia, ADD, achilles tenotomy, L heart cath & angio 06/2017, coronary stent 06/2017, C4-6 fusion    Rehab Potential  Good    PT Treatment/Interventions  ADLs/Self Care Home Management;Cryotherapy;Electrical Stimulation;Ultrasound;Moist Heat;Therapeutic activities;Therapeutic exercise;Neuromuscular re-education;Patient/family education;Passive range of motion;Manual techniques;Dry needling;Energy conservation;Taping    PT Next Visit Plan  postural/shoulder strengthening activities; MT to address cervical, upper shoulder musculature; posture and body mechanics instruction     Consulted and Agree with Plan of Care  Patient       Patient will benefit from skilled therapeutic intervention in order to improve the following deficits and impairments:  Hypomobility, Decreased activity tolerance, Decreased strength, Impaired UE functional use, Pain, Increased muscle spasms, Decreased range of motion, Improper body mechanics, Postural dysfunction, Impaired flexibility  Visit Diagnosis: 1. Cervicalgia   2. Other symptoms and signs involving the musculoskeletal system   3. Abnormal posture   4. Muscle weakness  (generalized)        Problem List Patient Active Problem List   Diagnosis Date Noted  . Onychomycosis 05/25/2018  . Palpitations 07/06/2017  . CAD (coronary artery disease) 07/05/2017  . Family history of early CAD 07/05/2017  . Abnormal findings on diagnostic imaging of cardiovascular system 06/23/2017  . Low vitamin D level 04/22/2017  . Pain in right ankle and joints of right foot 10/05/2016  . Hemarthrosis of right elbow 02/03/2016  . Small thenar eminence 02/03/2016  . Cervical disc disorder with radiculopathy of cervical region 10/07/2015  . Incomplete rotator cuff tear 09/10/2015  . Nonischemic cardiomyopathy ([place]) 05/15/2015  . Fatigue 05/15/2015  . Dyspnea on exertion 05/15/2015  . Decreased cardiac ejection fraction 04/02/2015  . Strain of latissimus dorsi muscle 01/21/2015  . Shoulder bursitis 10/12/2014  . Stenosis of lateral recess of lumbar spine 10/12/2014  . BPPV (benign paroxysmal positional vertigo) 08/14/2014  . Hyperlipidemia 01/19/2014  . Anemia, unspecified 01/19/2014  . ADD (attention deficit disorder) 01/19/2014  . Type 2 diabetes mellitus ([place]) 12/22/2013  . Hypogonadism in male 12/22/2013  . Arthritis of hand, left 12/22/2013    Bess Harvest, PTA 11/03/18 9:05 AM   [facility] [address]  [place], [place], [address] Phone: [phone]   Fax:  [phone]  Name: STPEHEN PETITJEAN MRN: [id number] Date of Birth: June 03, 1955

## 2018-11-08 ENCOUNTER — Other Ambulatory Visit: Payer: Self-pay

## 2018-11-08 ENCOUNTER — Ambulatory Visit: Payer: 59 | Admitting: Physical Therapy

## 2018-11-08 ENCOUNTER — Encounter: Payer: Self-pay | Admitting: Physical Therapy

## 2018-11-08 DIAGNOSIS — M6281 Muscle weakness (generalized): Secondary | ICD-10-CM | POA: Diagnosis not present

## 2018-11-08 DIAGNOSIS — R293 Abnormal posture: Secondary | ICD-10-CM

## 2018-11-08 DIAGNOSIS — R29898 Other symptoms and signs involving the musculoskeletal system: Secondary | ICD-10-CM

## 2018-11-08 DIAGNOSIS — M542 Cervicalgia: Secondary | ICD-10-CM

## 2018-11-08 NOTE — Therapy (Addendum)
[facility] [address]  [place], [place], [address] Phone: [phone]   Fax:  [phone]  Physical Therapy Treatment  Patient Details  Name: Reginald Rios MRN: [id number] Date of Birth: 10-10-1955 Referring Provider (PT): Kristeen Miss, MD   Encounter Date: 11/08/2018  PT End of Session - 11/08/18 0957    Visit Number  16    Number of Visits  17    Date for PT Re-Evaluation  11/09/18    Authorization Type  [facility]    PT Start Time  0805    PT Stop Time  0906    PT Time Calculation (min)  61 min    Activity Tolerance  Patient tolerated treatment well;Patient limited by pain    Behavior During Therapy  [facility] for tasks assessed/performed       Past Medical History:  Diagnosis Date  . ADD (attention deficit disorder)   . Anemia   . Arthritis   . CAD (coronary artery disease)    2/19 PCI/DESx1 to Lcx  . Diabetes mellitus without complication ([place])   . Exercise-induced asthma   . Heart murmur   . Hyperlipidemia    family hx of high cholesterol  . Hypogonadism in male   . Low back pain   . Osteoarthritis     Past Surgical History:  Procedure Laterality Date  . CORONARY STENT INTERVENTION N/A 06/23/2017   Procedure: CORONARY STENT INTERVENTION;  Surgeon: Leonie Man, MD;  Location: [place] [facility];  Service: Cardiovascular;  Laterality: N/A;  . INTRAVASCULAR PRESSURE WIRE/FFR STUDY N/A 06/23/2017   Procedure: INTRAVASCULAR PRESSURE WIRE/FFR STUDY;  Surgeon: Leonie Man, MD;  Location: [place] [facility];  Service: Cardiovascular;  Laterality: N/A;  . KNEE ARTHROSCOPY    . LEFT HEART CATH AND CORONARY ANGIOGRAPHY N/A 06/23/2017   Procedure: LEFT HEART CATH AND CORONARY ANGIOGRAPHY;  Surgeon: Leonie Man, MD;  Location: [place] [facility];  Service: Cardiovascular;  Laterality: N/A;  . TENOTOMY ACHILLES TENDON      There were no vitals filed for this visit.  Subjective Assessment - 11/08/18  0807    Subjective  Reports that he played pickleball for 3 hours yesterday and is not feeling too bad. Neck is feeling good. Reports 80% improvement in neck pain. Reports dizziness when looking up for long periods. Still has remaining R shoulder pain.    Pertinent History  OA, LBP, HLD, heart murmur, asthma, DM, CAD, anemia, ADD, achilles tenotomy, L heart cath & angio 06/2017, coronary stent 06/2017, C4-6 fusion     Diagnostic tests  09/04/18 cervical MRI: No acute osseous injury of the cervical spine. Anterior cervical fusion from C4-C6. Moderate B foraminal stenosis at C4-5. Likely mild myelomalacia C5-6. At C3-4 there is moderate left facet arthropathy and left uncovertebral degenerative changes. Severe L foraminal stenosis.     Patient Stated Goals  "I'd like the pain to stop"    Currently in Pain?  Yes    Pain Score  6     Pain Location  Shoulder    Pain Orientation  Right    Pain Descriptors / Indicators  Aching    Pain Type  Acute pain         [facility] PT Assessment - 11/08/18 0001      Observation/Other Assessments   Focus on Therapeutic Outcomes (FOTO)   Cervical: 54 (46% limited, 49% predicted)      AROM   AROM Assessment  Site  Cervical    Cervical Flexion  57    Cervical Extension  30   mild pain   Cervical - Right Side Bend  26   mild pain   Cervical - Left Side Bend  25   7/10 pain   Cervical - Right Rotation  40   mild pain   Cervical - Left Rotation  38   mild pain     Strength   Right Shoulder Flexion  4+/5    Right Shoulder ABduction  3+/5   pain   Right Shoulder Internal Rotation  4/5    Right Shoulder External Rotation  4/5    Left Shoulder Flexion  4+/5    Left Shoulder ABduction  4+/5    Left Shoulder Internal Rotation  4+/5    Left Shoulder External Rotation  4+/5                   [facility] Adult PT Treatment/Exercise - 11/08/18 0001      Neck Exercises: Machines for Strengthening   UBE (Upper Arm Bike)  Lvl 1.5, 3 min forward/3 min  backwards       Neck Exercises: Seated   Other Seated Exercise  R shoulder AAROM scaption rollouts with orange pball x10 to tolerance    Other Seated Exercise  R shoulder AAROM flexion rollouts with orange pball x10 to tolerance      Vasopneumatic   Number Minutes Vasopneumatic   15 minutes    Vasopnuematic Location   Shoulder   R   Vasopneumatic Pressure  Low    Vasopneumatic Temperature   coldest      Manual Therapy   Manual Therapy  Soft tissue mobilization;Myofascial release    Manual therapy comments  supine     Soft tissue mobilization  STM to R pec major and posterior deltoid- increased soft tissue restriction and tenderness    Myofascial Release  TPR to R pec major and posterior delt    Passive ROM  manual R pec stretch in varying positions       Neck Exercises: Stretches   Upper Trapezius Stretch  Right;Left;30 seconds;1 rep    Upper Trapezius Stretch Limitations  with strap OP    Levator Stretch  Right;Left;30 seconds;1 rep    Levator Stretch Limitations  with strap OP             PT Education - 11/08/18 0956    Education Details  update to HEP; discussion on objective progress with PT    Person(s) Educated  Patient    Methods  Explanation;Demonstration;Tactile cues;Verbal cues;Handout    Comprehension  Verbalized understanding;Returned demonstration       PT Short Term Goals - 11/08/18 9242      PT SHORT TERM GOAL #1   Title  Patient to be independent with initial HEP.    Time  4    Period  Weeks    Status  Achieved    Target Date  10/12/18        PT Long Term Goals - 11/08/18 6834      PT LONG TERM GOAL #1   Title  Patient to be independent with advanced HEP.    Time  8    Period  Weeks    Status  Partially Met   met for current     PT LONG TERM GOAL #2   Title  Patient to demonstrate [facility] and pain-free cervical AROM.    Time  8  Period  Weeks    Status  Partially Met   demonstrating significant improvement in all cervical AROM;  intermittent pain remains at end ranges     PT LONG TERM GOAL #3   Title  Patient to demonstrate >=4+/5 strength in B shoulders.    Time  8    Period  Weeks    Status  Partially Met   met for L shoulder, R shoulder still limited in all planes d/t pain     PT LONG TERM GOAL #4   Title  Patient to report tolerance for 4 hours of supine positioning without pain limiting.     Time  8    Period  Weeks    Status  Achieved   reports that he is able to lay supine for unlimited amount of time     PT LONG TERM GOAL #5   Title  Patient to report return to playing tennis without limitations or pain.    Time  8    Period  Weeks    Status  Partially Met   reports playing tennis with R shoulder pain most limiting- up to 8/10 pain, no cervical pain but some difficulty looking overhead           Plan - 11/08/18 1003    Clinical Impression Statement  Patient reporting 80% improvement in neck pain since initial eval. Still with remaining R shoulder pain without known cause. Notes that he is unable to tolerate playing tennis d/t R shoulder pain, but was able to tolerate 3 hours of pickleball yesterday without issues. Patient has demonstrated significant improvement in all cervical AROM; intermittent pain remains at end ranges. Strength goal is met for L shoulder. R shoulder strength still limited in all planes d/t pain, but IR/ER has improved since last measurement. Reviewed ther-ex with patient for max benefit- patient reporting understanding of HEP and without remaining questions. Tolerated STM and manual TPR to R pec and posterior deltoid- patient with considerable soft tissue restriction and tenderness throughout. Restriction improved after manual therapy. Ended session with [place] to R shoulder for post-exercise soreness. Patient without complaints at end of session. Patient has shown good improvements in neck pain and functional activity tolerance, now most limited by R shoulder pain. Thus,  placing patient on 30 day hold until he sees MD for shoulder.    Comorbidities  OA, LBP, HLD, heart murmur, asthma, DM, CAD, anemia, ADD, achilles tenotomy, L heart cath & angio 06/2017, coronary stent 06/2017, C4-6 fusion    PT Treatment/Interventions  ADLs/Self Care Home Management;Cryotherapy;Electrical Stimulation;Ultrasound;Moist Heat;Therapeutic activities;Therapeutic exercise;Neuromuscular re-education;Patient/family education;Passive range of motion;Manual techniques;Dry needling;Energy conservation;Taping    PT Next Visit Plan  30 day hold    Consulted and Agree with Plan of Care  Patient       Patient will benefit from skilled therapeutic intervention in order to improve the following deficits and impairments:  Hypomobility, Decreased activity tolerance, Decreased strength, Impaired UE functional use, Pain, Increased muscle spasms, Decreased range of motion, Improper body mechanics, Postural dysfunction, Impaired flexibility  Visit Diagnosis: 1. Cervicalgia   2. Other symptoms and signs involving the musculoskeletal system   3. Abnormal posture   4. Muscle weakness (generalized)        Problem List Patient Active Problem List   Diagnosis Date Noted  . Onychomycosis 05/25/2018  . Palpitations 07/06/2017  . CAD (coronary artery disease) 07/05/2017  . Family history of early CAD 07/05/2017  . Abnormal findings on diagnostic imaging of cardiovascular  system 06/23/2017  . Low vitamin D level 04/22/2017  . Pain in right ankle and joints of right foot 10/05/2016  . Hemarthrosis of right elbow 02/03/2016  . Small thenar eminence 02/03/2016  . Cervical disc disorder with radiculopathy of cervical region 10/07/2015  . Incomplete rotator cuff tear 09/10/2015  . Nonischemic cardiomyopathy ([place]) 05/15/2015  . Fatigue 05/15/2015  . Dyspnea on exertion 05/15/2015  . Decreased cardiac ejection fraction 04/02/2015  . Strain of latissimus dorsi muscle 01/21/2015  . Shoulder bursitis  10/12/2014  . Stenosis of lateral recess of lumbar spine 10/12/2014  . BPPV (benign paroxysmal positional vertigo) 08/14/2014  . Hyperlipidemia 01/19/2014  . Anemia, unspecified 01/19/2014  . ADD (attention deficit disorder) 01/19/2014  . Type 2 diabetes mellitus ([place]) 12/22/2013  . Hypogonadism in male 12/22/2013  . Arthritis of hand, left 12/22/2013     Janene Harvey, PT, DPT 11/08/18 10:04 AM   [facility] [address]  [place], [place], [address] Phone: [phone]   Fax:  [phone]  Name: NICOLO TOMKO MRN: [id number] Date of Birth: 27-Oct-1955  PHYSICAL THERAPY DISCHARGE SUMMARY  Visits from Start of Care: 16  Current functional level related to goals / functional outcomes: See above clinical impression; patient did not return after 30 day hold   Remaining deficits: Decreased strength, ROM, pain with tennis    Education / Equipment: HEP  Plan: Patient agrees to discharge.  Patient goals were partially met. Patient is being discharged due to being pleased with the current functional level.  ?????     Janene Harvey, PT, DPT 12/15/18 4:24 PM

## 2018-11-09 NOTE — Progress Notes (Signed)
Reginald Rios - 63 y.o. male MRN [id number]  Date of birth: May 21, 1955  SUBJECTIVE:  Including CC & ROS.  Chief Complaint  Patient presents with  . Follow-up    follow up for right arm    Reginald Rios is a 63 y.o. male that is presenting with ongoing right arm pain.  The pain is originating from his neck and radiates down to the radial and ulnar aspect of his hand.  He has had chronic atrophy with previous EMG.  He had an MRI performed in May which showed stenosis and facet arthropathy.  He received a subacromial injection on 6/11 and did not receive any improvement of his symptoms.  He has been ongoing to physical therapy for cervicalgia and notes improvement of his range of motion of his neck and shoulder.  His symptoms seem to be worse to where he is unable to complete a serve in tennis.  He feels weakness in his hand and has to concentrate when performing fine motor functions.  Is on gabapentin and has some improvement.  Was started on baclofen on 6/11 and notes some improvement when he takes it at night.   Review of Systems  Constitutional: Negative for fever.  HENT: Negative for congestion.   Respiratory: Negative for cough.   Cardiovascular: Negative for chest pain.  Gastrointestinal: Negative for abdominal pain.  Musculoskeletal: Positive for neck pain.  Skin: Negative for color change.  Neurological: Positive for weakness and numbness.  Hematological: Negative for adenopathy.    HISTORY: Past Medical, Surgical, Social, and Family History Reviewed & Updated per EMR.   Pertinent Historical Findings include:  Past Medical History:  Diagnosis Date  . ADD (attention deficit disorder)   . Anemia   . Arthritis   . CAD (coronary artery disease)    2/19 PCI/DESx1 to Lcx  . Diabetes mellitus without complication ([place])   . Exercise-induced asthma   . Heart murmur   . Hyperlipidemia    family hx of high cholesterol  . Hypogonadism in male   . Low back pain   .  Osteoarthritis     Past Surgical History:  Procedure Laterality Date  . CORONARY STENT INTERVENTION N/A 06/23/2017   Procedure: CORONARY STENT INTERVENTION;  Surgeon: Leonie Man, MD;  Location: [place] [facility];  Service: Cardiovascular;  Laterality: N/A;  . INTRAVASCULAR PRESSURE WIRE/FFR STUDY N/A 06/23/2017   Procedure: INTRAVASCULAR PRESSURE WIRE/FFR STUDY;  Surgeon: Leonie Man, MD;  Location: [place] [facility];  Service: Cardiovascular;  Laterality: N/A;  . KNEE ARTHROSCOPY    . LEFT HEART CATH AND CORONARY ANGIOGRAPHY N/A 06/23/2017   Procedure: LEFT HEART CATH AND CORONARY ANGIOGRAPHY;  Surgeon: Leonie Man, MD;  Location: [place];  Service: Cardiovascular;  Laterality: N/A;  . TENOTOMY ACHILLES TENDON      Allergies  Allergen Reactions  . Invokana [Canagliflozin] Rash  . Statins Rash and Other (See Comments)    Muscle aches   . Lisinopril Cough    Family History  Problem Relation Age of Onset  . Lung cancer Mother 33  . Cancer Mother        lung cancer  . Coronary artery disease Father 77  . Heart disease Father   . Hypothyroidism Brother   . Colon cancer Neg Hx   . Prostate cancer Neg Hx      Social History   Socioeconomic History  . Marital status: Married    Spouse name: Not on file  .  Number of children: Not on file  . Years of education: Not on file  . Highest education level: Not on file  Occupational History  . Occupation: Air cabin crew   Social Needs  . Financial resource strain: Not on file  . Food insecurity    Worry: Not on file    Inability: Not on file  . Transportation needs    Medical: Not on file    Non-medical: Not on file  Tobacco Use  . Smoking status: Never Smoker  . Smokeless tobacco: Never Used  Substance and Sexual Activity  . Alcohol use: Yes    Alcohol/week: 0.0 standard drinks    Comment: rare  . Drug use: No  . Sexual activity: Not on file  Lifestyle  . Physical activity    Days per week:  Not on file    Minutes per session: Not on file  . Stress: Not on file  Relationships  . Social Herbalist on phone: Not on file    Gets together: Not on file    Attends religious service: Not on file    Active member of club or organization: Not on file    Attends meetings of clubs or organizations: Not on file    Relationship status: Not on file  . Intimate partner violence    Fear of current or ex partner: Not on file    Emotionally abused: Not on file    Physically abused: Not on file    Forced sexual activity: Not on file  Other Topics Concern  . Not on file  Social History Narrative   Previously worked as Geophysicist/field seismologist   Currently working at [employer] clinic   Married 30 years   2 children ages 58, 29   Wife is psychiatrist - Dr. Margurite Rios   Moved from [place] up in [place]:  VS: BP 135/69   Ht 5\' 10"  (1.778 m)   Wt 195 lb (88.5 kg)   BMI 27.98 kg/m  Physical Exam Gen: NAD, alert, cooperative with exam, well-appearing ENT: normal lips, normal nasal mucosa,  Eye: normal EOM, normal conjunctiva and lids CV:  no edema, +2 pedal pulses   Resp: no accessory muscle use, non-labored,  Skin: no rashes, no areas of induration  Neuro: normal tone, normal sensation to touch Psych:  normal insight, alert and oriented MSK:  Right shoulder:  Normal Active flexion and abduction  Normal external rotation. Normal strength resistance with internal and external rotation. No pain with empty can testing but weaker than the left. Right hand: Chronic atrophy occurring over the thenar and hyperthenar eminence.      ASSESSMENT & PLAN:   Cervical disc disorder with radiculopathy of cervical region Symptoms seem more related to his neck with some form of nerve impingement.  MRI in May was showing stenosis.  Did not receive any improvement from the subacromial injection.  -Provided samples of Rayos.  Counseled on him monitoring his sugars  -Provided a handout on the sam support. -EMG. -May need to consider epidural injections

## 2018-11-10 ENCOUNTER — Encounter: Payer: Self-pay | Admitting: Family Medicine

## 2018-11-10 ENCOUNTER — Ambulatory Visit: Payer: 59 | Admitting: Family Medicine

## 2018-11-10 ENCOUNTER — Other Ambulatory Visit: Payer: Self-pay

## 2018-11-10 VITALS — BP 135/69 | Ht 70.0 in | Wt 195.0 lb

## 2018-11-10 DIAGNOSIS — M5010 Cervical disc disorder with radiculopathy, unspecified cervical region: Secondary | ICD-10-CM | POA: Diagnosis not present

## 2018-11-10 NOTE — Assessment & Plan Note (Signed)
Symptoms seem more related to his neck with some form of nerve impingement.  MRI in May was showing stenosis.  Did not receive any improvement from the subacromial injection.  -Provided samples of Rayos.  Counseled on him monitoring his sugars -Provided a handout on the sam support. -EMG. -May need to consider epidural injections

## 2018-11-10 NOTE — Patient Instructions (Signed)
Good to see you Please try the Rayos. THis may increase your blood sugar so let me know how much your blood sugar gets raised.  You will get call to schedule the nerve study  Please send me a message in MyChart with any questions or updates.  I will call after the EMG is performed and we'll go from there.   --Dr. Raeford Razor

## 2018-11-10 NOTE — Progress Notes (Signed)
Medication Samples have been provided to the patient.  Drug name: Rayos     Strength: 5mg         Qty: 1 Box  LOT: [id number]  Exp.Date: 09/2019  Dosing instructions: Take 1 tablet at bedtime.  The patient has been instructed regarding the correct time, dose, and frequency of taking this medication, including desired effects and most common side effects.   Sherrie George, MA 10:47 AM 11/10/2018

## 2018-11-10 NOTE — Progress Notes (Signed)
Medication Samples have been provided to the patient.  Drug name: Rayos       Strength: 2mg         Qty: 1 Box  LOT: [id number]  Exp.Date: 07/2019  Dosing instructions: Take 1 tablet at bedtime (after completion of the 5mg .)  The patient has been instructed regarding the correct time, dose, and frequency of taking this medication, including desired effects and most common side effects.   Sherrie George, MA 10:48 AM 11/10/2018

## 2018-11-14 ENCOUNTER — Other Ambulatory Visit: Payer: Self-pay | Admitting: Family Medicine

## 2018-11-14 MED ORDER — JANUMET 50-1000 MG PO TABS: ORAL_TABLET | ORAL | 1 refills | Status: DC

## 2018-11-14 MED FILL — PIOGLITAZONE HCL 30 MG TAB: 30 | 30 days supply | Qty: 30 | Fill #1

## 2018-11-14 MED FILL — CLOPIDOGREL 75 MG TABLET: 75 | 90 days supply | Qty: 90 | Fill #2

## 2018-11-14 MED FILL — JANUMET 50-1,000 MG TABLET: 50-1000 | 30 days supply | Qty: 60 | Fill #0

## 2018-11-15 MED FILL — TESTOSTERONE 50 MG/5GM (1%): 50 MG/5GM | 30 days supply | Qty: 300 | Fill #1

## 2018-11-27 DIAGNOSIS — M542 Cervicalgia: Secondary | ICD-10-CM | POA: Diagnosis not present

## 2018-12-01 MED FILL — METHYLPHENIDATE 20 MG TAB: 20 | 30 days supply | Qty: 30 | Fill #0

## 2018-12-09 MED FILL — GABAPENTIN 100 MG CAPSULE: 100 | 90 days supply | Qty: 90 | Fill #2

## 2018-12-10 ENCOUNTER — Other Ambulatory Visit: Payer: Self-pay

## 2018-12-10 ENCOUNTER — Encounter: Payer: Self-pay | Admitting: Family Medicine

## 2018-12-10 ENCOUNTER — Ambulatory Visit (INDEPENDENT_AMBULATORY_CARE_PROVIDER_SITE_OTHER): Payer: 59 | Admitting: Family Medicine

## 2018-12-10 VITALS — Wt 190.0 lb

## 2018-12-10 DIAGNOSIS — T63454A Toxic effect of venom of hornets, undetermined, initial encounter: Secondary | ICD-10-CM

## 2018-12-10 MED ORDER — PREDNISONE 10 MG (21) PO TBPK: ORAL_TABLET | ORAL | 0 refills | Status: DC

## 2018-12-10 NOTE — Progress Notes (Signed)
Chief Complaint  Patient presents with  . Insect Bite    Reginald Rios is a 63 y.o. male here for a skin complaint/bee sting. Due to COVID-19 pandemic, we are interacting via web portal for an electronic face-to-face visit. I verified patient's ID using 2 identifiers. Patient agreed to proceed with visit via this method. Patient is at home, I am at office. Patient and I are present for visit.   Duration: 3 days Location: R hand b/t thumb and index finger on dorsum Pruritic? Yes Painful? No Drainage? No Other associated symptoms: swelling that won't go down Therapies tried thus far: Benadryl that helped w itch  ROS:  Const: No fevers Skin: As noted in HPI  Past Medical History:  Diagnosis Date  . ADD (attention deficit disorder)   . Anemia   . Arthritis   . CAD (coronary artery disease)    2/19 PCI/DESx1 to Lcx  . Diabetes mellitus without complication ([place])   . Exercise-induced asthma   . Heart murmur   . Hyperlipidemia    family hx of high cholesterol  . Hypogonadism in male   . Low back pain   . Osteoarthritis     Wt 190 lb (86.2 kg)   BMI 27.26 kg/m  No conversational dyspnea Age appropriate judgment and insight Nml affect and mood No erythema, soft tiss swelling noted, 4 areas of excoriation noted  Hornet sting, undetermined intent, initial encounter - Plan: predniSONE (STERAPRED UNI-PAK 21 TAB) 10 MG (21) TBPK tablet, Zyrtec prn itch, ice. Warning signs and symptoms verbalized.  F/u prn. The patient voiced understanding and agreement to the plan.  [place], DO 12/10/18 12:42 PM

## 2018-12-14 ENCOUNTER — Ambulatory Visit: Payer: 59 | Admitting: Cardiology

## 2018-12-14 NOTE — Progress Notes (Deleted)
Cardiology Office Note:    Date:  12/14/2018   ID:  Reginald Rios, DOB 19-Oct-1955, MRN [id number]  PCP:  [name], DO  Cardiologist:  Shirlee More, MD    Referring MD: [name]*    ASSESSMENT:    No diagnosis found. PLAN:    In order of problems listed above:  1. ***   Next appointment: ***   Medication Adjustments/Labs and Tests Ordered: Current medicines are reviewed at length with the patient today.  Concerns regarding medicines are outlined above.  No orders of the defined types were placed in this encounter.  No orders of the defined types were placed in this encounter.   No chief complaint on file.   History of Present Illness:    Reginald Rios is a 63 y.o. male with a hx of CAD EF 5-55% with PCi and stent to mid distal circumflex 06/23/17 with calcified LAD 60% with FFr of 0.83, DM2, HLD last seen 01/18/18. He works IT trainer. He presents today for 6 month follow up.  Compliance with diet, lifestyle and medications: *** Past Medical History:  Diagnosis Date  . ADD (attention deficit disorder)   . Anemia   . Arthritis   . CAD (coronary artery disease)    2/19 PCI/DESx1 to Lcx  . Diabetes mellitus without complication ([place])   . Exercise-induced asthma   . Heart murmur   . Hyperlipidemia    family hx of high cholesterol  . Hypogonadism in male   . Low back pain   . Osteoarthritis     Past Surgical History:  Procedure Laterality Date  . CORONARY STENT INTERVENTION N/A 06/23/2017   Procedure: CORONARY STENT INTERVENTION;  Surgeon: Leonie Man, MD;  Location: [facility];  Service: Cardiovascular;  Laterality: N/A;  . INTRAVASCULAR PRESSURE WIRE/FFR STUDY N/A 06/23/2017   Procedure: INTRAVASCULAR PRESSURE WIRE/FFR STUDY;  Surgeon: Leonie Man, MD;  Location: [facility] [facility];  Service: Cardiovascular;  Laterality: N/A;  . KNEE ARTHROSCOPY    . LEFT HEART CATH AND CORONARY ANGIOGRAPHY N/A  06/23/2017   Procedure: LEFT HEART CATH AND CORONARY ANGIOGRAPHY;  Surgeon: Leonie Man, MD;  Location: [facility];  Service: Cardiovascular;  Laterality: N/A;  . TENOTOMY ACHILLES TENDON      Current Medications: No outpatient medications have been marked as taking for the 12/14/18 encounter (Appointment) with Richardo Priest, MD.     Allergies:   Invokana [canagliflozin], Statins, and Lisinopril   Social History   Socioeconomic History  . Marital status: Married    Spouse name: Not on file  . Number of children: Not on file  . Years of education: Not on file  . Highest education level: Not on file  Occupational History  . Occupation: Air cabin crew   Social Needs  . Financial resource strain: Not on file  . Food insecurity    Worry: Not on file    Inability: Not on file  . Transportation needs    Medical: Not on file    Non-medical: Not on file  Tobacco Use  . Smoking status: Never Smoker  . Smokeless tobacco: Never Used  Substance and Sexual Activity  . Alcohol use: Yes    Alcohol/week: 0.0 standard drinks    Comment: rare  . Drug use: No  . Sexual activity: Not on file  Lifestyle  . Physical activity    Days per week: Not on file    Minutes per session:  Not on file  . Stress: Not on file  Relationships  . Social Herbalist on phone: Not on file    Gets together: Not on file    Attends religious service: Not on file    Active member of club or organization: Not on file    Attends meetings of clubs or organizations: Not on file    Relationship status: Not on file  Other Topics Concern  . Not on file  Social History Narrative   Previously worked as Geophysicist/field seismologist   Currently working at [employer] clinic   Married 22 years   2 children ages 16, 76   Wife is psychiatrist - Dr. Margurite Auerbach   Moved from [place] up in [place] History: The patient's ***family history includes Cancer in his mother; Coronary artery disease  (age of onset: 29) in his father; Heart disease in his father; Hypothyroidism in his brother; Lung cancer (age of onset: 71) in his mother. There is no history of Colon cancer or Prostate cancer. ROS:   Please see the history of present illness.    All other systems reviewed and are negative.  EKGs/Labs/Other Studies Reviewed:    The following studies were reviewed today:  EKG:  EKG ordered today and personally reviewed.  The ekg ordered today demonstrates ***  Recent Labs: 05/25/2018: ALT 28; BUN 15; Creatinine, Ser 0.87; Hemoglobin 13.6; Platelets 220.0; Potassium 5.0; Sodium 139  Recent Lipid Panel    Component Value Date/Time   CHOL 135 05/25/2018 0920   CHOL 150 04/21/2017 1019   TRIG 78.0 05/25/2018 0920   HDL 45.90 05/25/2018 0920   HDL 53 04/21/2017 1019   CHOLHDL 3 05/25/2018 0920   VLDL 15.6 05/25/2018 0920   LDLCALC 73 05/25/2018 0920   LDLCALC 74 04/21/2017 1019    Physical Exam:    VS:  There were no vitals taken for this visit.    Wt Readings from Last 3 Encounters:  12/10/18 190 lb (86.2 kg)  11/10/18 195 lb (88.5 kg)  10/13/18 193 lb (87.5 kg)     GEN: *** Well nourished, well developed in no acute distress HEENT: Normal NECK: No JVD; No carotid bruits LYMPHATICS: No lymphadenopathy CARDIAC: ***RRR, no murmurs, rubs, gallops RESPIRATORY:  Clear to auscultation without rales, wheezing or rhonchi  ABDOMEN: Soft, non-tender, non-distended MUSCULOSKELETAL:  No edema; No deformity  SKIN: Warm and dry NEUROLOGIC:  Alert and oriented x 3 PSYCHIATRIC:  Normal affect    Signed, Shirlee More, MD  12/14/2018 8:22 AM    [facility]

## 2018-12-16 ENCOUNTER — Other Ambulatory Visit: Payer: Self-pay | Admitting: Cardiology

## 2018-12-16 MED FILL — LOSARTAN POTASSIUM 25 MG TA: 25 | 30 days supply | Qty: 30 | Fill #0

## 2018-12-16 NOTE — Telephone Encounter (Signed)
This is Dr. Joya Gaskins pt now.

## 2018-12-19 ENCOUNTER — Ambulatory Visit: Payer: 59 | Admitting: Cardiology

## 2018-12-19 MED FILL — PIOGLITAZONE HCL 30 MG TAB: 30 | 30 days supply | Qty: 30 | Fill #2

## 2018-12-20 ENCOUNTER — Ambulatory Visit (INDEPENDENT_AMBULATORY_CARE_PROVIDER_SITE_OTHER): Payer: 59 | Admitting: Family Medicine

## 2018-12-20 ENCOUNTER — Encounter: Payer: Self-pay | Admitting: Family Medicine

## 2018-12-20 ENCOUNTER — Other Ambulatory Visit: Payer: Self-pay

## 2018-12-20 VITALS — Temp 98.6°F

## 2018-12-20 DIAGNOSIS — J45909 Unspecified asthma, uncomplicated: Secondary | ICD-10-CM

## 2018-12-20 DIAGNOSIS — B351 Tinea unguium: Secondary | ICD-10-CM

## 2018-12-20 MED ORDER — FLUTICASONE-SALMETEROL 250-50 MCG/DOSE IN AEPB: 1.0000 | INHALATION_SPRAY | Freq: Two times a day (BID) | RESPIRATORY_TRACT | 0 refills | Status: DC

## 2018-12-20 MED FILL — ADVAIR 250/50 DISKUS: 250-50 | 30 days supply | Qty: 60 | Fill #0

## 2018-12-20 MED FILL — MONTELUKAST SOD 10 MG TAB: 10 | 90 days supply | Qty: 90 | Fill #0

## 2018-12-20 NOTE — Progress Notes (Signed)
Chief Complaint  Patient presents with  . Cough    one week    Reginald Rios here for URI complaints. Due to COVID-19 pandemic, we are interacting via web portal for an electronic face-to-face visit. I verified patient's ID using 2 identifiers. Patient agreed to proceed with visit via this method. Patient is at home, I am at office. Patient and I are present for visit.   Duration: 1 week  Associated symptoms: tickle in throat, dry cough (has hx of asthma- allergy induced) Denies: sinus congestion, rhinorrhea, ear pain, ear drainage, sore throat, wheezing, shortness of breath, myalgia, digestive s/s's, and fevers Treatment to date: Claritin, Benadryl, SABA Sick contacts: No  Thinks he is starting to get toenail fungus again. Has not tried anything thus far. Went to [employer] and it started to get better. No pain, does not feel that it is ingrowing.    ROS:  Const: Denies fevers HEENT: As noted in HPI Lungs: No SOB  Past Medical History:  Diagnosis Date  . ADD (attention deficit disorder)   . Anemia   . Arthritis   . CAD (coronary artery disease)    2/19 PCI/DESx1 to Lcx  . Diabetes mellitus without complication ([place])   . Exercise-induced asthma   . Heart murmur   . Hyperlipidemia    family hx of high cholesterol  . Hypogonadism in male   . Low back pain   . Osteoarthritis    Exam Temp 98.6 F (37 C) (Oral)  No conversational dyspnea Age appropriate judgment and insight Nml affect and mood  Extrinsic asthma without complication, unspecified asthma severity, unspecified whether persistent - Plan: Fluticasone-Salmeterol (ADVAIR DISKUS) 250-50 MCG/DOSE AEPB, cont SABA as needed.  Onychomycosis - Plan: offered watchful waiting, topical med and PO med. Decided for option 1, considering 2.   F/u prn. If starting to experience fevers, shaking, or shortness of breath, seek immediate care. Pt voiced understanding and agreement to the plan.  [place],  DO 12/20/18 2:42 PM

## 2018-12-21 ENCOUNTER — Ambulatory Visit: Payer: 59 | Admitting: Family Medicine

## 2018-12-21 ENCOUNTER — Other Ambulatory Visit: Payer: Self-pay

## 2018-12-21 ENCOUNTER — Telehealth: Payer: Self-pay | Admitting: Family Medicine

## 2018-12-21 DIAGNOSIS — R6889 Other general symptoms and signs: Secondary | ICD-10-CM | POA: Diagnosis not present

## 2018-12-21 DIAGNOSIS — R059 Cough, unspecified: Secondary | ICD-10-CM

## 2018-12-21 DIAGNOSIS — R05 Cough: Secondary | ICD-10-CM

## 2018-12-21 DIAGNOSIS — Z20822 Contact with and (suspected) exposure to covid-19: Secondary | ICD-10-CM

## 2018-12-21 MED ORDER — BENZONATATE 100 MG PO CAPS: 100.0000 mg | ORAL_CAPSULE | Freq: Three times a day (TID) | ORAL | 0 refills | Status: DC | PRN

## 2018-12-21 MED FILL — BENZONATATE 100 MG CAPS: 100 | 10 days supply | Qty: 30 | Fill #0

## 2018-12-21 NOTE — Telephone Encounter (Signed)
Copied from [place] [id number]. Topic: General - Other >> Dec 21, 2018 12:21 PM Rainey Pines A wrote: Patient was seen yesterday and feels that medication isnt working for his cough and would like a callback from Dr. Serita Sheller nurse in regards to medication.   Called the patient and he states the cough is wetter and thinks maybe he may have an infection. He said the advar is causing more congestion. Temp is around 98 to 99. No changes.

## 2018-12-21 NOTE — Telephone Encounter (Signed)
Let's test him for covid. If Advair making worse, can stop. Will call in med to help w cough in meanwhile while we wait on results. Ty.

## 2018-12-21 NOTE — Addendum Note (Signed)
Addended by: Sharon Seller B on: 12/21/2018 01:54 PM   Modules accepted: Orders

## 2018-12-21 NOTE — Telephone Encounter (Signed)
Ordered covid test Called the patient informed of PCP instructions. Gave the patient address to [facility] testing. Please do send in something for his cough

## 2018-12-23 LAB — NOVEL CORONAVIRUS, NAA: SARS-CoV-2, NAA: NOT DETECTED

## 2018-12-26 MED FILL — EZETIMIBE 10 MG TABS: 10 | 60 days supply | Qty: 60 | Fill #0

## 2018-12-28 DIAGNOSIS — M542 Cervicalgia: Secondary | ICD-10-CM | POA: Diagnosis not present

## 2018-12-29 ENCOUNTER — Other Ambulatory Visit: Payer: Self-pay

## 2018-12-29 ENCOUNTER — Encounter: Payer: 59 | Admitting: Diagnostic Neuroimaging

## 2018-12-29 ENCOUNTER — Ambulatory Visit (INDEPENDENT_AMBULATORY_CARE_PROVIDER_SITE_OTHER): Payer: 59 | Admitting: Diagnostic Neuroimaging

## 2018-12-29 DIAGNOSIS — M5412 Radiculopathy, cervical region: Secondary | ICD-10-CM

## 2018-12-29 DIAGNOSIS — Z0289 Encounter for other administrative examinations: Secondary | ICD-10-CM

## 2018-12-29 DIAGNOSIS — M5010 Cervical disc disorder with radiculopathy, unspecified cervical region: Secondary | ICD-10-CM

## 2019-01-02 NOTE — Procedures (Signed)
[facility]  NCS (NERVE CONDUCTION STUDY) WITH EMG (ELECTROMYOGRAPHY) REPORT   STUDY DATE: 12/29/18 PATIENT NAME: Reginald Rios DOB: 1956-04-25 MRN: [id number]  ORDERING CLINICIAN: Tia Alert  TECHNOLOGIST: Sherre Scarlet ELECTROMYOGRAPHER: Earlean Polka. Rachelann Enloe, MD  CLINICAL INFORMATION: 63 year old male here for evaluation of right neck pain, arm and hand pain.  History of right ulnar neuropathy status post surgery in ~2000.  Also history of cervical spine surgery in 2017.  FINDINGS: NERVE CONDUCTION STUDY: Bilateral median and left ulnar motor responses are normal.  Right ulnar motor responses normal distal latency, decreased amplitude, slow conduction velocity with stimulation above the elbow, normal conduction latency with stimulation below the elbow.  Bilateral radial, left median and left ulnar sensory responses normal.  Right median and right ulnar sensory responses have prolonged peak latencies and decreased amplitudes.  Right ulnar F-wave latency is prolonged.  Left ulnar F wave latency is normal.    NEEDLE ELECTROMYOGRAPHY:  Needle examination of right upper extremity and right cervical paraspinal muscles notable for decreased motor unit recruitment in right triceps, right flexor carpi ulnaris, right first dorsal interosseous muscles.  Right cervical paraspinal muscles unremarkable.    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 - Chronic denervation in right triceps may be related to underlying right C7 radiculopathy.    INTERPRETING PHYSICIAN:  Penni Bombard, MD Certified in Neurology, Neurophysiology and Neuroimaging  [facility] [address] [place], [place] [address] [phone]   [facility]    Nerve / Sites Muscle Latency Ref. Amplitude Ref. Rel Amp Segments Distance Velocity Ref. Area    ms ms mV mV %  cm m/s m/s mVms  R Median - APB     Wrist APB 3.6  ?4.4 7.6 ?4.0 100 Wrist - APB 7   24.6     Upper arm APB 8.1  6.5  84.9 Upper arm - Wrist 24 54 ?49 22.5  L Median - APB     Wrist APB 3.6 ?4.4 6.9 ?4.0 100 Wrist - APB 7   21.8     Upper arm APB 8.1  6.1  89 Upper arm - Wrist 24 54 ?49 23.8  R Ulnar - ADM     Wrist ADM 3.1 ?3.3 4.3 ?6.0 100 Wrist - ADM 7   10.4     B.Elbow ADM 7.4  2.5  58 B.Elbow - Wrist 21 49 ?49 8.4     A.Elbow ADM 10.2  2.4  94.4 A.Elbow - B.Elbow 10 36 ?49 8.6         A.Elbow - Wrist      L Ulnar - ADM     Wrist ADM 2.6 ?3.3 9.3 ?6.0 100 Wrist - ADM 7   27.7     B.Elbow ADM 6.8  8.3  89 B.Elbow - Wrist 22 52 ?49 24.9     A.Elbow ADM 8.8  8.4  101 A.Elbow - B.Elbow 10 52 ?49 25.9         A.Elbow - Wrist                 SNC    Nerve / Sites Rec. Site Peak Lat Ref.  Amp Ref. Segments Distance    ms ms V V  cm  R Radial - Anatomical snuff box (Forearm)     Forearm Wrist 2.7 ?2.9 15 ?15 Forearm - Wrist 10  L Radial - Anatomical snuff box (Forearm)  Forearm Wrist 2.2 ?2.9 16 ?15 Forearm - Wrist 10  R Median - Orthodromic (Dig II, Mid palm)     Dig II Wrist 3.5 ?3.4 9 ?10 Dig II - Wrist 13  L Median - Orthodromic (Dig II, Mid palm)     Dig II Wrist 3.3 ?3.4 12 ?10 Dig II - Wrist 13  R Ulnar - Orthodromic, (Dig V, Mid palm)     Dig V Wrist 3.9 ?3.1 2 ?5 Dig V - Wrist 11  L Ulnar - Orthodromic, (Dig V, Mid palm)     Dig V Wrist 3.1 ?3.1 6 ?5 Dig V - Wrist 36                 F  Wave    Nerve F Lat Ref.   ms ms  R Ulnar - ADM 39.1 ?32.0  L Ulnar - ADM 30.6 ?32.0         EMG full       EMG Summary Table    Spontaneous MUAP Recruitment  Muscle IA Fib PSW Fasc Other Amp Dur. Poly Pattern  R. Deltoid Normal None None None _______ Normal Normal Normal Normal  R. Biceps brachii Normal None None None _______ Normal Normal Normal Normal  R. Triceps brachii Normal None None None _______ Increased Normal Normal Reduced  R. Brachioradialis Normal None None None _______ Normal Normal Normal Normal  R. Flexor  carpi radialis Normal None None None _______ Normal Normal Normal Normal  R. Flexor carpi ulnaris Normal None None None _______ Increased Normal Normal Discrete  R. First dorsal interosseous Normal None None None _______ Decreased Normal Normal Discrete  R. Cervical paraspinals Normal None None None _______ Normal Normal Normal Normal

## 2019-01-03 ENCOUNTER — Encounter: Payer: Self-pay | Admitting: Family Medicine

## 2019-01-03 ENCOUNTER — Ambulatory Visit (INDEPENDENT_AMBULATORY_CARE_PROVIDER_SITE_OTHER): Payer: 59 | Admitting: Family Medicine

## 2019-01-03 ENCOUNTER — Other Ambulatory Visit: Payer: Self-pay

## 2019-01-03 VITALS — Ht 70.0 in | Wt 190.0 lb

## 2019-01-03 DIAGNOSIS — M545 Low back pain, unspecified: Secondary | ICD-10-CM

## 2019-01-03 DIAGNOSIS — M5010 Cervical disc disorder with radiculopathy, unspecified cervical region: Secondary | ICD-10-CM | POA: Diagnosis not present

## 2019-01-03 DIAGNOSIS — G5621 Lesion of ulnar nerve, right upper limb: Secondary | ICD-10-CM | POA: Insufficient documentation

## 2019-01-03 HISTORY — DX: Low back pain, unspecified: M54.50

## 2019-01-03 HISTORY — DX: Lesion of ulnar nerve, right upper limb: G56.21

## 2019-01-03 MED ORDER — BACLOFEN 10 MG PO TABS: 5.0000 mg | ORAL_TABLET | Freq: Two times a day (BID) | ORAL | 0 refills | Status: DC | PRN

## 2019-01-03 MED FILL — BACLOFEN 10 MG TABS: 10 | 30 days supply | Qty: 30 | Fill #0

## 2019-01-03 NOTE — Assessment & Plan Note (Signed)
Discussed EMG results. Prior decompression of the cervical spine. Results suggest ongoing C7 radiculopathy  - increase gabapentin to 300 mg TID  - counseled on HEP and supportive care - advised to discuss with neurosurgeon to see if he he's had epidural or facet injections already.

## 2019-01-03 NOTE — Assessment & Plan Note (Signed)
Acute on chronic in nature. Previous surgery with relief but now symptomatic again.  - counseled on vitamin b complex and vitamin E - increased gabapentin  - counseled on supportive care - could consider injection or discuss with surgeon.

## 2019-01-03 NOTE — Progress Notes (Signed)
Virtual Visit via Telephone Note  I connected with Reginald Rios on 01/03/19 at  2:30 PM EDT by telephone and verified that I am speaking with the correct person using two identifiers.   I discussed the limitations, risks, security and privacy concerns of performing an evaluation and management service by telephone and the availability of in person appointments. I also discussed with the patient that there may be a patient responsible charge related to this service. The patient expressed understanding and agreed to proceed.   History of Present Illness:   Reginald Rios is a 63 yo M that is following for his EMG studies. He has ongoing acute on chronic right arm pain and right hand weakness. He has a history of prior EMG in 2017. He's had two epidurals already. Has had an ulnar nerve surgery previously in [place]. Has had a prior fusion of the cervical spine.   Has new ongoing low back pain. He mowed his lawn and had been active recently. He is having lower lumbar pain. Pain worse with movement. Is having cramping in his groin.   Review of the EMG from 8/27 shows right chronic ulnar neuropathy at the elbow and chronic denervation of the right triceps that may be related to underlying right C7 radiculopathy.   Observations/Objective:  Gen: NAD, alert,   Psych:  normal insight, alert and oriented   Assessment and Plan:  Ulnar neuropathy  Acute on chronic in nature. Previous surgery with relief but now symptomatic again.  - counseled on vitamin b complex and vitamin E - increased gabapentin  - counseled on supportive care - could consider injection or discuss with surgeon.   Low back pain  Acute in nature. Started since being active this weekend  - refilled baclofen  - counseled on supportive care - if no improvement consider imaging, PT.   Cervical Radiculopathy  Discussed EMG results. Prior decompression of the cervical spine. Results suggest ongoing C7 radiculopathy  - increase  gabapentin to 300 mg TID  - counseled on HEP and supportive care - advised to discuss with neurosurgeon to see if he he's had epidural or facet injections already.   Follow Up Instructions:    I discussed the assessment and treatment plan with the patient. The patient was provided an opportunity to ask questions and all were answered. The patient agreed with the plan and demonstrated an understanding of the instructions.   The patient was advised to call back or seek an in-person evaluation if the symptoms worsen or if the condition fails to improve as anticipated.  I provided 15 minutes of non-face-to-face time during this encounter.   Clearance Coots, MD

## 2019-01-03 NOTE — Assessment & Plan Note (Signed)
Acute in nature. Started since being active this weekend  - refilled baclofen  - counseled on supportive care - if no improvement consider imaging, PT.

## 2019-01-04 MED FILL — TESTOSTERONE 50 MG/5GM (1%): 50 MG/5GM | 30 days supply | Qty: 300 | Fill #2

## 2019-01-05 ENCOUNTER — Other Ambulatory Visit: Payer: Self-pay

## 2019-01-05 ENCOUNTER — Other Ambulatory Visit: Payer: Self-pay | Admitting: Family Medicine

## 2019-01-05 ENCOUNTER — Ambulatory Visit (INDEPENDENT_AMBULATORY_CARE_PROVIDER_SITE_OTHER): Payer: 59

## 2019-01-05 DIAGNOSIS — Z23 Encounter for immunization: Secondary | ICD-10-CM

## 2019-01-05 MED ORDER — METHYLPHENIDATE HCL 20 MG PO TABS: 20.0000 mg | ORAL_TABLET | Freq: Every day | ORAL | 0 refills | Status: DC

## 2019-01-05 MED ORDER — GABAPENTIN 300 MG PO CAPS: 300.0000 mg | ORAL_CAPSULE | Freq: Three times a day (TID) | ORAL | 3 refills | Status: DC

## 2019-01-05 MED FILL — METHYLPHENIDATE 20 MG TAB: 20 | 30 days supply | Qty: 30 | Fill #0

## 2019-01-05 MED FILL — GABAPENTIN 300 MG CAPSULE: 300 | 60 days supply | Qty: 180 | Fill #0

## 2019-01-05 NOTE — Telephone Encounter (Signed)
Last Ritalin RX: 11/04/18, #30 Last OV: 12/20/18 acute visit Next OV: not scheduled UDS: not on file CSC: not on file

## 2019-01-05 NOTE — Telephone Encounter (Signed)
Resent. Will need to get UDS and CSC when in person visits with this patient resume.  Please sched visit for DM check. He is due. Can be virtual or in person at his preference. Ty.

## 2019-01-06 NOTE — Telephone Encounter (Signed)
Called left message to call back 

## 2019-01-06 NOTE — Telephone Encounter (Signed)
Called and scheduled appt on 01/10/2019 at 3 PM

## 2019-01-08 NOTE — Progress Notes (Signed)
Cardiology Office Note:    Date:  01/10/2019   ID:  Reginald Rios, DOB 12-Jul-1955, MRN [id number]  PCP:  Reginald Pal, DO  Cardiologist:  Shirlee More, MD    Referring MD: Reginald Rios*    ASSESSMENT:    1. Coronary artery disease involving native coronary artery of native heart without angina pectoris   2. Hyperlipidemia, unspecified hyperlipidemia type   3. Type 2 diabetes mellitus with diabetic polyneuropathy, with long-term current use of insulin (HCC)   4. Cervical radicular pain    PLAN:    In order of problems listed above:  1. Stable CAD now more than 6 months from PCI and stent continue dual antiplatelet pain will readdress at his next visit in 6 months likely drop aspirin and continue clopidogrel.  He is on a good medical regimen including his combined Zetia and statin therapy for hyperlipidemia discomfort New York Heart Association class I 2. We will continue combined Zetia low intensity statin 3. And discuss with his PCP alternate diabetic therapy with his concerns of weight loss and CAD 4. Proved with gabapentin I encouraged him to be active find alternate activities that are comfortable like walking or cycling rather than tennis move forward in life   Next appointment: 6 months   Medication Adjustments/Labs and Tests Ordered: Current medicines are reviewed at length with the patient today.  Concerns regarding medicines are outlined above.  Orders Placed This Encounter  Procedures  . EKG 12-Lead   No orders of the defined types were placed in this encounter.   Chief Complaint  Patient presents with  . Follow-up    for   . Coronary Artery Disease    History of Present Illness:    Reginald Rios is a 63 y.o. male with a hx of CAD EF 50-55% with PCI and stenting to the mid distal circumflex 06/23/17 and a  calcified LAD 60% with FFR of 0.83, T2DM and hyperlipidemia.  He was last seen 01/19/2019. Compliance with diet, lifestyle  and medications: Yes  Left heart cath 06/23/2017: Conclusion   CULPRIT LESION mid Cx to Dist Cx lesion is 80% stenosed.  A drug-eluting stent was successfully placed using a STENT SYNERGY DES 2.5X16. Postdilated and taper fashion from 3.1-2.6 mm  Post intervention, there is a 0% residual stenosis.  Prox LAD to Mid LAD lesion is 60% stenosed. --FFR 0.83. Not significant.  The ostial-proximal LAD and Circumflex have mild-moderate calcification without significant stenosis.  OM1 is a relatively small caliber vessel after gives off a branch and is not a good PCI target. No obvious lesions were noted to explain the FFR positive findings.   Successful PCI to distal circumflex likely culprit lesion reducing 80% to 0% with a Synergy DES stent postdilated in tapered fashion. FFR not significant lesion of the LAD medical management -this correlates with CTA   Coronary Diagrams  Diagnostic Dominance: Left  Intervention    Overall he is not doing well he is having severe radicular pain C7 on the right side and has had previous surgery.  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  He was placed on gabapentin which has helped him but has left him somewhat apathetic and just not interested in exercising at this time he has had no angina dyspnea palpitation or syncope.  He is concerned about weight gain he takes Actos  I do not think is the best medication with his diabetes and I asked him to discuss with his PCP alternate diabetic medications that are cardioprotective associated with weight loss like metformin GLP-1 agonist like Victoza or SGLT2 inhibitors like Jardiance.  EKG shows sinus rhythm and normal we will plan to discontinue dual antiplatelet therapy February at the one-year anniversary of his PCI.  He has had no bleeding complications from clopidogrel. Past Medical  History:  Diagnosis Date  . ADD (attention deficit disorder)   . Anemia   . Arthritis   . CAD (coronary artery disease)    2/19 PCI/DESx1 to Lcx  . Diabetes mellitus without complication ([place])   . Exercise-induced asthma   . Heart murmur   . Hyperlipidemia    family hx of high cholesterol  . Hypogonadism in male   . Low back pain   . Osteoarthritis     Past Surgical History:  Procedure Laterality Date  . CORONARY STENT INTERVENTION N/A 06/23/2017   Procedure: CORONARY STENT INTERVENTION;  Surgeon: Leonie Man, MD;  Location: [facility];  Service: Cardiovascular;  Laterality: N/A;  . INTRAVASCULAR PRESSURE WIRE/FFR STUDY N/A 06/23/2017   Procedure: INTRAVASCULAR PRESSURE WIRE/FFR STUDY;  Surgeon: Leonie Man, MD;  Location: [facility];  Service: Cardiovascular;  Laterality: N/A;  . KNEE ARTHROSCOPY    . LEFT HEART CATH AND CORONARY ANGIOGRAPHY N/A 06/23/2017   Procedure: LEFT HEART CATH AND CORONARY ANGIOGRAPHY;  Surgeon: Leonie Man, MD;  Location: [facility];  Service: Cardiovascular;  Laterality: N/A;  . TENOTOMY ACHILLES TENDON      Current Medications: Current Meds  Medication Sig  . aspirin EC 81 MG tablet Take 81 mg by mouth daily.  . baclofen (LIORESAL) 10 MG tablet Take 0.5 tablets (5 mg total) by mouth 2 (two) times daily as needed for muscle spasms.  . clopidogrel (PLAVIX) 75 MG tablet TAKE 1 TABLET (75 MG TOTAL) BY MOUTH DAILY WITH BREAKFAST.  Marland Kitchen ezetimibe (ZETIA) 10 MG tablet TAKE 1 TABLET (10 MG TOTAL) BY MOUTH DAILY.  Marland Kitchen Fluticasone-Salmeterol (ADVAIR DISKUS) 250-50 MCG/DOSE AEPB Inhale 1 puff into the lungs 2 (two) times daily for 14 days.  Marland Kitchen gabapentin (NEURONTIN) 300 MG capsule Take 1 capsule (300 mg total) by mouth 3 (three) times daily.  Marland Kitchen lidocaine (LIDODERM) 5 % Place 1 patch onto the skin daily. Remove & Discard patch within 12 hours or as directed by MD  . losartan (COZAAR) 25 MG tablet TAKE 1 TABLET (25 MG TOTAL) BY  MOUTH DAILY.  . methylphenidate (RITALIN) 20 MG tablet TAKE 1 TABLET BY MOUTH ONCE DAILY  . montelukast (SINGULAIR) 10 MG tablet Take 10 mg by mouth daily.  . nitroGLYCERIN (NITROSTAT) 0.4 MG SL tablet Place 1 tablet (0.4 mg total) under the tongue every 5 (five) minutes as needed.  . pravastatin (PRAVACHOL) 20 MG tablet Take 1 tablet (20 mg total) by mouth daily.  . sildenafil (VIAGRA) 50 MG tablet Take 1 tablet (50 mg total) by mouth daily as needed for erectile dysfunction.  Marland Kitchen testosterone (ANDROGEL) 50 MG/5GM (1%) GEL Place 10 g onto the skin daily.  . VENTOLIN HFA 108 (90 Base) MCG/ACT inhaler Inhale 1 puff into the lungs every 6 (six) hours as needed (cough).  . [DISCONTINUED] pioglitazone (ACTOS) 30 MG tablet TAKE 1 TABLET (30 MG TOTAL) BY MOUTH DAILY.  . [DISCONTINUED] sitaGLIPtin-metformin (JANUMET) 50-1000 MG tablet TAKE 1 TABLET BY MOUTH TWICE DAILY WITH A MEAL  Allergies:   Invokana [canagliflozin], Statins, and Lisinopril   Social History   Socioeconomic History  . Marital status: Married    Spouse name: Not on file  . Number of children: Not on file  . Years of education: Not on file  . Highest education level: Not on file  Occupational History  . Occupation: Air cabin crew   Social Needs  . Financial resource strain: Not on file  . Food insecurity    Worry: Not on file    Inability: Not on file  . Transportation needs    Medical: Not on file    Non-medical: Not on file  Tobacco Use  . Smoking status: Never Smoker  . Smokeless tobacco: Never Used  Substance and Sexual Activity  . Alcohol use: Yes    Alcohol/week: 0.0 standard drinks    Comment: rare  . Drug use: No  . Sexual activity: Not on file  Lifestyle  . Physical activity    Days per week: Not on file    Minutes per session: Not on file  . Stress: Not on file  Relationships  . Social Herbalist on phone: Not on file    Gets together: Not on file    Attends religious service: Not on  file    Active member of club or organization: Not on file    Attends meetings of clubs or organizations: Not on file    Relationship status: Not on file  Other Topics Concern  . Not on file  Social History Narrative   Previously worked as Geophysicist/field seismologist   Currently working at [employer] clinic   Married 75 years   2 children ages 72, 42   Wife is psychiatrist - Dr. Margurite Auerbach   Moved from [place] in [place] History: The patient's family history includes Cancer in his mother; Coronary artery disease (age of onset: 53) in his father; Heart disease in his father; Hypothyroidism in his brother; Lung cancer (age of onset: 28) in his mother. There is no history of Colon cancer or Prostate cancer. ROS:   Please see the history of present illness.    All other systems reviewed and are negative.  EKGs/Labs/Other Studies Reviewed:    The following studies were reviewed today:  EKG:  EKG ordered today and personally reviewed.  The ekg ordered today demonstrates sinus rhythm and is normal  Recent Labs: 05/25/2018: ALT 28; BUN 15; Creatinine, Ser 0.87; Hemoglobin 13.6; Platelets 220.0; Potassium 5.0; Sodium 139  Recent Lipid Panel    Component Value Date/Time   CHOL 135 05/25/2018 0920   CHOL 150 04/21/2017 1019   TRIG 78.0 05/25/2018 0920   HDL 45.90 05/25/2018 0920   HDL 53 04/21/2017 1019   CHOLHDL 3 05/25/2018 0920   VLDL 15.6 05/25/2018 0920   LDLCALC 73 05/25/2018 0920   LDLCALC 74 04/21/2017 1019    Physical Exam:    VS:  BP 122/80 (BP Location: Right Arm, Patient Position: Sitting, Cuff Size: Normal)   Pulse 80   Temp 98.1 F (36.7 C)   Ht 5\' 10"  (1.778 m)   Wt 204 lb 6.4 oz (92.7 kg)   SpO2 98%   BMI 29.33 kg/m     Wt Readings from Last 3 Encounters:  01/10/19 204 lb 8 oz (92.8 kg)  01/10/19 204 lb 6.4 oz (92.7 kg)  01/03/19 190 lb (86.2 kg)     GEN:  Well nourished, well developed  in no acute distress HEENT: Normal NECK: No JVD; No  carotid bruits LYMPHATICS: No lymphadenopathy CARDIAC: RRR, no murmurs, rubs, gallops RESPIRATORY:  Clear to auscultation without rales, wheezing or rhonchi  ABDOMEN: Soft, non-tender, non-distended MUSCULOSKELETAL:  No edema; No deformity  SKIN: Warm and dry NEUROLOGIC:  Alert and oriented x 3 PSYCHIATRIC:  Normal affect    Signed, Shirlee More, MD  01/10/2019 3:21 PM    [place]

## 2019-01-10 ENCOUNTER — Encounter: Payer: Self-pay | Admitting: Family Medicine

## 2019-01-10 ENCOUNTER — Encounter: Payer: Self-pay | Admitting: Cardiology

## 2019-01-10 ENCOUNTER — Ambulatory Visit (INDEPENDENT_AMBULATORY_CARE_PROVIDER_SITE_OTHER): Payer: 59 | Admitting: Cardiology

## 2019-01-10 ENCOUNTER — Ambulatory Visit: Payer: 59 | Admitting: Family Medicine

## 2019-01-10 ENCOUNTER — Other Ambulatory Visit: Payer: Self-pay

## 2019-01-10 VITALS — BP 122/80 | HR 80 | Temp 98.1°F | Ht 70.0 in | Wt 204.4 lb

## 2019-01-10 VITALS — BP 110/72 | HR 85 | Temp 97.7°F | Ht 70.0 in | Wt 204.5 lb

## 2019-01-10 DIAGNOSIS — R635 Abnormal weight gain: Secondary | ICD-10-CM

## 2019-01-10 DIAGNOSIS — I2510 Atherosclerotic heart disease of native coronary artery without angina pectoris: Secondary | ICD-10-CM | POA: Diagnosis not present

## 2019-01-10 DIAGNOSIS — Z794 Long term (current) use of insulin: Secondary | ICD-10-CM

## 2019-01-10 DIAGNOSIS — E1142 Type 2 diabetes mellitus with diabetic polyneuropathy: Secondary | ICD-10-CM | POA: Diagnosis not present

## 2019-01-10 DIAGNOSIS — E785 Hyperlipidemia, unspecified: Secondary | ICD-10-CM | POA: Diagnosis not present

## 2019-01-10 DIAGNOSIS — Z79899 Other long term (current) drug therapy: Secondary | ICD-10-CM

## 2019-01-10 DIAGNOSIS — M5412 Radiculopathy, cervical region: Secondary | ICD-10-CM

## 2019-01-10 HISTORY — DX: Radiculopathy, cervical region: M54.12

## 2019-01-10 MED ORDER — OZEMPIC (0.25 OR 0.5 MG/DOSE) 2 MG/1.5ML ~~LOC~~ SOPN: PEN_INJECTOR | SUBCUTANEOUS | 1 refills | Status: AC

## 2019-01-10 MED ORDER — DAPAGLIFLOZIN PROPANEDIOL 10 MG PO TABS: 10.0000 mg | ORAL_TABLET | Freq: Every day | ORAL | 5 refills | Status: DC

## 2019-01-10 MED ORDER — METFORMIN HCL 1000 MG PO TABS: 1000.0000 mg | ORAL_TABLET | Freq: Two times a day (BID) | ORAL | 3 refills | Status: DC

## 2019-01-10 MED FILL — FARXIGA 10 MG TABLET: 10 | 30 days supply | Qty: 30 | Fill #0

## 2019-01-10 MED FILL — metFORMIN HCL 1000 MG TABS: 1000 | 90 days supply | Qty: 180 | Fill #0

## 2019-01-10 MED FILL — OZEMPIC 0.25 OR 0.5 MG/DOSE: 2 | 28 days supply | Qty: 2 | Fill #0

## 2019-01-10 NOTE — Patient Instructions (Signed)
Medication Instructions:  Your physician recommends that you continue on your current medications as directed. Please refer to the Current Medication list given to you today.  If you need a refill on your cardiac medications before your next appointment, please call your pharmacy.   Lab work: None  If you have labs (blood work) drawn today and your tests are completely normal, you will receive your results only by: . MyChart Message (if you have MyChart) OR . A paper copy in the mail If you have any lab test that is abnormal or we need to change your treatment, we will call you to review the results.  Testing/Procedures: You had an EKG today.   Follow-Up: At [facility], you and your health needs are our priority.  As part of our continuing mission to provide you with exceptional heart care, we have created designated Provider Care Teams.  These Care Teams include your primary Cardiologist (physician) and Advanced Practice Providers (APPs -  Physician Assistants and Nurse Practitioners) who all work together to provide you with the care you need, when you need it. You will need a follow up appointment in 6 months.  Please call our office 2 months in advance to schedule this appointment.    

## 2019-01-10 NOTE — Patient Instructions (Addendum)
Keep the diet clean and stay active.  Give us 2-3 business days to get the results of your labs back.   Let us know if you need anything.  

## 2019-01-10 NOTE — Progress Notes (Signed)
Subjective:  CC: DM  Reginald Rios is a 63 y.o. male here for follow-up of diabetes.   Md's self monitored glucose range is mid 100's.  Patient denies hypoglycemic reactions. He checks his glucose levels 1 time per day. Patient does not require insulin.   Medications include: Janumet, Actos Exercise: none  Having issues with wt gain.  He is having some swelling in his lower extremities.  He would like to change his diabetic medication as his cardiologist recommended adding Victoza or Trulicity.  He was also told that Actos can contribute to weight gain/swelling.  Past Medical History:  Diagnosis Date  . ADD (attention deficit disorder)   . Anemia   . Arthritis   . CAD (coronary artery disease)    2/19 PCI/DESx1 to Lcx  . Diabetes mellitus without complication ([place])   . Exercise-induced asthma   . Heart murmur   . Hyperlipidemia    family hx of high cholesterol  . Hypogonadism in male   . Low back pain   . Osteoarthritis      Related testing: Date of retinal exam: Done Pneumovax: done  Review of Systems: Pulmonary:  No SOB Cardiovascular:  No chest pain  Objective:  BP 110/72 (BP Location: Left Arm, Patient Position: Sitting, Cuff Size: Normal)   Pulse 85   Temp 97.7 F (36.5 C) (Temporal)   Ht 5\' 10"  (1.778 m)   Wt 204 lb 8 oz (92.8 kg)   SpO2 98%   BMI 29.34 kg/m  General:  Well developed, well nourished, in no apparent distress Eyes:  Pupils equal and round, sclera anicteric without injection  Lungs:  CTAB, no access msc use Cardio:  RRR, no bruits, no LE edema Psych: Age appropriate judgment and insight  Assessment:   Type 2 diabetes mellitus with diabetic polyneuropathy, with long-term current use of insulin (HCC) - Plan: metFORMIN (GLUCOPHAGE) 1000 MG tablet, Semaglutide,0.25 or 0.5MG /DOS, (OZEMPIC, 0.25 OR 0.5 MG/DOSE,) 2 MG/1.5ML SOPN, dapagliflozin propanediol (FARXIGA) 10 MG TABS tablet, Hemoglobin A1c  Weight gain  Encounter for long-term  (current) use of high-risk medication - Plan: Pain Mgmt, Profile 8 w/Conf, U   Plan:   1- Stop Janumet. Start Ozempic and reg Metformin. Change Farxiga for Actos. Counseled on diet and exercise. 2- as above. 3- Re-sign CSC and leave UDS. F/u in 3 mo. The patient voiced understanding and agreement to the plan.  [place], DO 01/10/19 5:06 PM

## 2019-01-11 LAB — HEMOGLOBIN A1C: Hgb A1c MFr Bld: 8.1 % — ABNORMAL HIGH (ref 4.6–6.5)

## 2019-01-11 LAB — PAIN MGMT, PROFILE 8 W/CONF, U
6 Acetylmorphine: NEGATIVE ng/mL
Alcohol Metabolites: NEGATIVE ng/mL (ref <500)
Amphetamines: NEGATIVE ng/mL
Benzodiazepines: NEGATIVE ng/mL
Buprenorphine, Urine: NEGATIVE ng/mL
Cocaine Metabolite: NEGATIVE ng/mL
Creatinine: 132.2 mg/dL
MDMA: NEGATIVE ng/mL
Marijuana Metabolite: NEGATIVE ng/mL
Opiates: NEGATIVE ng/mL
Oxidant: NEGATIVE ug/mL
Oxycodone: NEGATIVE ng/mL
pH: 5.7 (ref 4.5–9.0)

## 2019-01-20 MED FILL — LOSARTAN POTASSIUM 25 MG TA: 25 | 30 days supply | Qty: 30 | Fill #1

## 2019-01-28 DIAGNOSIS — M542 Cervicalgia: Secondary | ICD-10-CM | POA: Diagnosis not present

## 2019-02-02 ENCOUNTER — Telehealth: Payer: Self-pay | Admitting: Family Medicine

## 2019-02-02 ENCOUNTER — Ambulatory Visit: Payer: Self-pay | Admitting: Family Medicine

## 2019-02-02 NOTE — Telephone Encounter (Signed)
Three attempts throughout day to reach patient regarding issue.  Please advise: [phone]     Semaglutide,0.25 or 0.5MG /DOS, (OZEMPIC, 0.25 OR 0.5 MG/DOSE,) 2 MG/1.5ML SOPN [id number] .   Pt called and stated that he would like a call back from the nurse regarding medication. Pt states that this medication is giving him cramps. Please advise

## 2019-02-03 NOTE — Telephone Encounter (Signed)
Pt called and is requesting to speak with nurse. Please advise.

## 2019-02-03 NOTE — Telephone Encounter (Signed)
Called the patient informed of PCP instructions and he is agreeable to do as PCP said. Is there anything he can take for the cramps in the meantime?

## 2019-02-03 NOTE — Telephone Encounter (Signed)
Make sure he is not full and eating more. That is the most common cause for the cramping and sometimes nausea. We can call in a different med if so. Ty.

## 2019-02-03 NOTE — Telephone Encounter (Signed)
Could try to push through, may be temporary, if unbearable, we can change. I will plan on continuing for now. Do not increase dosage until cramps managed.

## 2019-02-03 NOTE — Telephone Encounter (Signed)
Called the patient and he is eating a lot less. He thinks it is working way better because he is not eating nearly as much. Could he take something for the cramps or needs to get use to the medication??

## 2019-02-03 NOTE — Telephone Encounter (Signed)
Called the patient informed of PCP instructions. He verbalized understanding.

## 2019-02-03 NOTE — Telephone Encounter (Signed)
Could try [place], I don't have a good answer for this.

## 2019-02-06 ENCOUNTER — Encounter: Payer: Self-pay | Admitting: Family Medicine

## 2019-02-07 ENCOUNTER — Other Ambulatory Visit: Payer: Self-pay | Admitting: Family Medicine

## 2019-02-07 MED ORDER — SITAGLIPTIN PHOSPHATE 100 MG PO TABS: 100.0000 mg | ORAL_TABLET | Freq: Every day | ORAL | 3 refills | Status: DC

## 2019-02-07 MED FILL — JANUVIA 100 MG TABLET: 100 | 30 days supply | Qty: 30 | Fill #0

## 2019-02-08 MED FILL — TESTOSTERONE 50 MG/5GM (1%): 50 MG/5GM | 30 days supply | Qty: 300 | Fill #3

## 2019-02-20 ENCOUNTER — Other Ambulatory Visit: Payer: Self-pay | Admitting: Cardiology

## 2019-02-20 MED FILL — METHYLPHENIDATE HCL 20 MG T: 20 | 30 days supply | Qty: 30 | Fill #0

## 2019-02-20 MED FILL — CLOPIDOGREL 75 MG TABLET: 75 | 90 days supply | Qty: 90 | Fill #0

## 2019-02-27 ENCOUNTER — Other Ambulatory Visit: Payer: Self-pay | Admitting: Cardiology

## 2019-02-27 DIAGNOSIS — M542 Cervicalgia: Secondary | ICD-10-CM | POA: Diagnosis not present

## 2019-02-28 MED FILL — LOSARTAN POTASSIUM 25 MG TA: 25 | 90 days supply | Qty: 90 | Fill #0

## 2019-03-01 ENCOUNTER — Other Ambulatory Visit: Payer: Self-pay | Admitting: Cardiology

## 2019-03-02 MED FILL — EZETIMIBE 10 MG TABS: 10 | 90 days supply | Qty: 90 | Fill #0

## 2019-03-14 ENCOUNTER — Other Ambulatory Visit: Payer: Self-pay | Admitting: Family Medicine

## 2019-03-14 DIAGNOSIS — E291 Testicular hypofunction: Secondary | ICD-10-CM

## 2019-03-15 MED FILL — TESTOSTERONE 50 MG/5GM (1%): 50 MG/5GM | 30 days supply | Qty: 300 | Fill #0

## 2019-03-16 MED FILL — JANUVIA 100 MG TABLET: 100 | 30 days supply | Qty: 30 | Fill #1

## 2019-03-22 ENCOUNTER — Other Ambulatory Visit: Payer: Self-pay | Admitting: Cardiology

## 2019-03-22 MED FILL — METHYLPHENIDATE HCL 20 MG T: 20 | 30 days supply | Qty: 30 | Fill #0

## 2019-03-22 MED FILL — MONTELUKAST SOD 10 MG TAB: 10 | 90 days supply | Qty: 90 | Fill #1

## 2019-03-23 MED FILL — SILDENAFIL CITRATE 50 MG TA: 50 | 25 days supply | Qty: 5 | Fill #0

## 2019-03-23 NOTE — Telephone Encounter (Signed)
Dr. Bettina Gavia sees this pt now. Please address

## 2019-03-26 ENCOUNTER — Encounter: Payer: Self-pay | Admitting: Family Medicine

## 2019-03-29 ENCOUNTER — Encounter: Payer: Self-pay | Admitting: Family Medicine

## 2019-03-29 ENCOUNTER — Other Ambulatory Visit: Payer: Self-pay

## 2019-03-29 ENCOUNTER — Ambulatory Visit (INDEPENDENT_AMBULATORY_CARE_PROVIDER_SITE_OTHER): Payer: 59 | Admitting: Family Medicine

## 2019-03-29 DIAGNOSIS — M79601 Pain in right arm: Secondary | ICD-10-CM

## 2019-03-29 DIAGNOSIS — R491 Aphonia: Secondary | ICD-10-CM | POA: Diagnosis not present

## 2019-03-29 DIAGNOSIS — Z794 Long term (current) use of insulin: Secondary | ICD-10-CM

## 2019-03-29 DIAGNOSIS — E1142 Type 2 diabetes mellitus with diabetic polyneuropathy: Secondary | ICD-10-CM | POA: Diagnosis not present

## 2019-03-29 NOTE — Progress Notes (Signed)
Chief Complaint  Patient presents with  . Follow-up    arm pain and blood sugar today 235    Subjective: Patient is a 63 y.o. male here for f/u. Due to COVID-19 pandemic, we are interacting via web portal for an electronic face-to-face visit. I verified patient's ID using 2 identifiers. Patient agreed to proceed with visit via this method. Patient is at home, I am at office. Patient and I are present for visit.   Patient has a history of diabetes.  He is currently taking Metformin 1000 mg twice daily and Januvia 100 mg daily.  He was initially on Ozempic but could not tolerate it due to side effects.  He was thus changed to [place].  His sugars have been running in the mid 200s.  He is wondering what he needs to do now.  He was called in for [facility] but never ended up starting it because we focused on the Ozempic.  Diet is healthy overall, he is active through walking the dog.  He has a history of right upper extremity pain and possible nerve impingement at C7.  Dr. Ellene Route performed his neurosurgery and is not of the mindset that that is causing his issue.  He describes a sharp pain.  He is taking gabapentin as prescribed by the sports medicine team.  Patient reports hoarseness/loss of voice intermittently over the past year.  He is having some longstanding cough but does not think that this is necessarily related.  He is not having any pain.  He has never seen the ear nose and throat team.  ROS: Endo: No hypoglycemia HEENT: no pain  Past Medical History:  Diagnosis Date  . ADD (attention deficit disorder)   . Anemia   . Arthritis   . CAD (coronary artery disease)    2/19 PCI/DESx1 to Lcx  . Diabetes mellitus without complication ([place])   . Exercise-induced asthma   . Heart murmur   . Hyperlipidemia    family hx of high cholesterol  . Hypogonadism in male   . Low back pain   . Osteoarthritis     Objective: No conversational dyspnea Age appropriate judgment and insight Nml affect  and mood   Assessment and Plan: Type 2 diabetes mellitus with diabetic polyneuropathy, with long-term current use of insulin (HCC)  Loss of voice - Plan: Ambulatory referral to ENT  Right arm pain - Plan: Ambulatory referral to Physical Medicine Rehab  Reginald Rios.  Continue Metformin and Januvia for now.  Counseled on diet and exercise.  Continue to monitor sugars. 2-refer to ENT for further evaluation. 3-continue gabapentin for now, refer to PM and R. F/u in 2-3 weeks to reck sugar.  Might trial on Trulicity.  He may be experiencing pancreatic insufficiency and need to go on insulin. The patient voiced understanding and agreement to the plan.  [place], DO 03/29/19  1:58 PM

## 2019-03-30 DIAGNOSIS — M542 Cervicalgia: Secondary | ICD-10-CM | POA: Diagnosis not present

## 2019-04-03 ENCOUNTER — Encounter: Payer: Self-pay | Admitting: Family Medicine

## 2019-04-07 ENCOUNTER — Ambulatory Visit: Payer: Self-pay | Admitting: *Deleted

## 2019-04-07 MED FILL — FARXIGA 10 MG TABLET: 10 | 30 days supply | Qty: 30 | Fill #0

## 2019-04-07 MED FILL — PRAVASTATIN NA 20 MG TAB: 20 | 90 days supply | Qty: 90 | Fill #2

## 2019-04-07 NOTE — Telephone Encounter (Signed)
Message from [employer] sent at 04/07/2019 4:03 PM EST  Summary: medication question   Pt states he was started on dapagliflozin propanediol (FARXIGA) 10 MG TABS tablet and wants to make sure that he isn't supposed to stop any of his other medications.          Patient called and wanted to confirm that he was supposed to continue to take [place] along with Metformin and Januvia. Pt informed per notes of Dr. Jone Baseman on 03/29/19 during office visit that he was to continue to take Metformin and Januvia along with [place]. Pt verbalized understanding. Pt states that he has been taking all 3 medications and is blood glucose has been in the 250s. Pt has appt with Dr. Nani Ravens on Tuesday to recheck glucose level. Pt advised to continue to take medications as prescribed by Dr. Nani Ravens and further recommendations would be discussed during visit on Tuesday. Pt verbalized understanding.   Reason for Disposition . Caller has medication question only, adult not sick, and triager answers question  Protocols used: MEDICATION QUESTION CALL-A-AH

## 2019-04-10 ENCOUNTER — Encounter: Payer: Self-pay | Admitting: Physical Therapy

## 2019-04-10 ENCOUNTER — Other Ambulatory Visit: Payer: Self-pay

## 2019-04-10 ENCOUNTER — Ambulatory Visit: Payer: 59 | Admitting: Physical Therapy

## 2019-04-10 DIAGNOSIS — M6281 Muscle weakness (generalized): Secondary | ICD-10-CM | POA: Insufficient documentation

## 2019-04-10 DIAGNOSIS — M5412 Radiculopathy, cervical region: Secondary | ICD-10-CM | POA: Diagnosis not present

## 2019-04-10 DIAGNOSIS — M25611 Stiffness of right shoulder, not elsewhere classified: Secondary | ICD-10-CM

## 2019-04-10 DIAGNOSIS — M7501 Adhesive capsulitis of right shoulder: Secondary | ICD-10-CM | POA: Diagnosis not present

## 2019-04-10 DIAGNOSIS — M79601 Pain in right arm: Secondary | ICD-10-CM | POA: Insufficient documentation

## 2019-04-10 DIAGNOSIS — G5621 Lesion of ulnar nerve, right upper limb: Secondary | ICD-10-CM | POA: Diagnosis not present

## 2019-04-10 DIAGNOSIS — M24511 Contracture, right shoulder: Secondary | ICD-10-CM | POA: Diagnosis not present

## 2019-04-10 DIAGNOSIS — R293 Abnormal posture: Secondary | ICD-10-CM

## 2019-04-10 NOTE — Therapy (Signed)
[facility] [address]  [place], [place], [address] Phone: [phone]   Fax:  [phone]  Physical Therapy Evaluation  Patient Details  Name: Reginald Rios MRN: [id number] Date of Birth: 12/10/1960 Referring Provider (PT): Crosby Oyster [place], [place]   Encounter Date: 04/10/2019  PT End of Session - 04/10/19 1112    Visit Number  1    Number of Visits  9    Date for PT Re-Evaluation  05/08/19    Authorization Type  [facility]    PT Start Time  1016    PT Stop Time  1102    PT Time Calculation (min)  46 min    Activity Tolerance  Patient tolerated treatment well;Patient limited by pain    Behavior During Therapy  [facility] for tasks assessed/performed       Past Medical History:  Diagnosis Date  . ADD (attention deficit disorder)   . Anemia   . Arthritis   . CAD (coronary artery disease)    2/19 PCI/DESx1 to Lcx  . Diabetes mellitus without complication ([place])   . Exercise-induced asthma   . Heart murmur   . Hyperlipidemia    family hx of high cholesterol  . Hypogonadism in male   . Low back pain   . Osteoarthritis     Past Surgical History:  Procedure Laterality Date  . CORONARY STENT INTERVENTION N/A 06/23/2017   Procedure: CORONARY STENT INTERVENTION;  Surgeon: Leonie Man, MD;  Location: [facility];  Service: Cardiovascular;  Laterality: N/A;  . INTRAVASCULAR PRESSURE WIRE/FFR STUDY N/A 06/23/2017   Procedure: INTRAVASCULAR PRESSURE WIRE/FFR STUDY;  Surgeon: Leonie Man, MD;  Location: [facility];  Service: Cardiovascular;  Laterality: N/A;  . KNEE ARTHROSCOPY    . LEFT HEART CATH AND CORONARY ANGIOGRAPHY N/A 06/23/2017   Procedure: LEFT HEART CATH AND CORONARY ANGIOGRAPHY;  Surgeon: Leonie Man, MD;  Location: [facility];  Service: Cardiovascular;  Laterality: N/A;  . TENOTOMY ACHILLES TENDON      There were no vitals filed for this visit.   Subjective  Assessment - 04/10/19 1021    Subjective  Patient reporting R arm pain first starting in February without trauma or injury. Has tried to be less active- not playing pickleball or tennis anymore. Reports N/T in R pinkly and dorsum of hand. Reports that he had a nerve study that reported a damaged nerve in R triceps as well as his ulnar nerve. Believes that the pain is originating from his neck. Pain starts at back of R side of neck and radiates down UT and sometimes down the lateral arm and into dorsal forearm. Without gabapentin he cannot comb his hair, reach out, and reach up, or reach up and extend wrist to shave under his chin. TENS unit helps but the benefit is short-lived. Has get problems with his blood glucose lately which is being managed by PCP-  blood glucose today was 192.    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 Chronic denervation in right triceps may be related to underlying right C7 radiculopathy.    Patient Stated Goals  get rid of back    Currently in Pain?  Yes    Pain Score  8     Pain Location  Shoulder    Pain Orientation  Right    Pain Descriptors / Indicators  Dull    Pain Type  Chronic pain    Pain Radiating Towards  radiating down lateral arm         [facility] PT Assessment - 04/10/19 1026      Assessment   Medical Diagnosis  R arm pain    Referring Provider (PT)  [name], DO    Onset Date/Surgical Date  06/04/18    Hand Dominance  Right    Next MD Visit  tomorrow    Prior Therapy  yes- neck      Precautions   Precautions  --   asthma     Balance Screen   Has the patient fallen in the past 6 months  No    Has the patient had a decrease in activity level because of a fear of falling?   No     Is the patient reluctant to leave their home because of a fear of falling?   No      Home Film/video editor residence    Living Arrangements  Spouse/significant other    Available Help at Discharge  Family    Type of [place] to enter      Prior Function   Level of Independence  Independent    Vocation  Retired    Leisure  pickleball, tennis      Cognition   Overall Cognitive Status  Within Functional Limits for tasks assessed      Observation/Other Assessments   Observations  considerable R dorsal thenar and hypothenar atrophy      Sensation   Light Touch  Appears Intact      Coordination   Gross Motor Movements are Fluid and Coordinated  Yes      Posture/Postural Control   Posture/Postural Control  Postural limitations    Postural Limitations  Rounded Shoulders;Forward head;Increased thoracic kyphosis      ROM / Strength   AROM / PROM / Strength  AROM;Strength;PROM      AROM   AROM Assessment Site  Shoulder;Cervical    Right/Left Shoulder  Right;Left    Right Shoulder Flexion  90 Degrees   moderate pain   Right Shoulder ABduction  70 Degrees   moderate pain   Right Shoulder Internal Rotation  --   FIR to sacrum   Right Shoulder External Rotation  --   FER to back of head   Left Shoulder Flexion  136 Degrees    Left Shoulder ABduction  114 Degrees    Left Shoulder Internal Rotation  --   FIR T5   Left Shoulder External Rotation  --   FER T3   Cervical Flexion  58    Cervical Extension  35    Cervical - Right Side Bend  25    Cervical - Left Side Bend  30    Cervical - Right Rotation  32    Cervical - Left Rotation  39   lower cervical pain     PROM   PROM Assessment Site  Shoulder    Right/Left Shoulder  Right   firm end feel on R; L PROM WNL   Right Shoulder Flexion  138 Degrees    Right Shoulder ABduction  112 Degrees    Right  Shoulder Internal Rotation  72 Degrees    Right Shoulder External  Rotation  38 Degrees      Strength   Strength Assessment Site  Shoulder;Elbow;Forearm;Wrist;Hand    Right/Left Shoulder  Right;Left    Right Shoulder Flexion  4/5   pain   Right Shoulder ABduction  3+/5   pain   Right Shoulder Internal Rotation  2+/5   pain   Right Shoulder External Rotation  2+/5   pain   Left Shoulder Flexion  4+/5    Left Shoulder ABduction  4+/5    Left Shoulder Internal Rotation  4/5    Left Shoulder External Rotation  4+/5    Right/Left Elbow  Right;Left    Right Elbow Flexion  4/5   pain   Right Elbow Extension  4-/5   pain   Left Elbow Flexion  4+/5    Left Elbow Extension  4+/5    Right/Left Wrist  Right;Left    Right Wrist Flexion  4/5    Right Wrist Extension  4/5    Left Wrist Flexion  4+/5    Left Wrist Extension  4+/5    Right/Left hand  Right;Left    Right Hand Grip (lbs)  25.33   20, 26, 30; elbow bend at slide- unsupported   Left Hand Grip (lbs)  39.33   45, 38, 35; elbow bend at slide- unsupported     Palpation   Palpation comment  increased soft tissue restriction in R UT, proximal biceps tendon and muscle belly, and pec; TTP over R infraspinatus, and pec                Objective measurements completed on examination: See above findings.              PT Education - 04/10/19 1111    Education Details  prognosis, POC, HEP    Person(s) Educated  Patient    Methods  Explanation;Demonstration;Tactile cues;Verbal cues;Handout    Comprehension  Verbalized understanding;Returned demonstration       PT Short Term Goals - 04/10/19 1124      PT SHORT TERM GOAL #1   Title  Patient to be independent with initial HEP.    Time  2    Period  Weeks    Status  New    Target Date  04/24/19        PT Long Term Goals - 04/10/19 1124      PT LONG TERM GOAL #1   Title  Patient to be independent with advanced HEP.    Time  4    Period  Weeks    Status  New    Target Date  05/08/19      PT LONG TERM GOAL #2   Title   Patient to demonstrate [facility] and pain-free R shoulder AROM/PROM.    Time  4    Period  Weeks    Status  New    Target Date  05/08/19      PT LONG TERM GOAL #3   Title  Patient to demonstrate >=4+/5 strength in R UE and demonstrate atleast 40lbs in R grip strength.    Time  4    Period  Weeks    Status  New    Target Date  05/08/19      PT LONG TERM GOAL #4   Title  Patient to report 70% improvement in pain levels with overhead reaching.    Time  4  Period  Weeks    Status  New    Target Date  05/08/19      PT LONG TERM GOAL #5   Title  Patient to report <3/10 pain in R UE with shaving.    Time  4    Period  Weeks    Status  New    Target Date  05/08/19             Plan - 04/10/19 1113    Clinical Impression Statement  Patient is a 63y/o M presenting to OPPT with c/o R shoulder pain since February 2020 without inciting trauma.  Recent EMG revealed chronic right ulnar neuropathy at the elbow and chronic denervation in right triceps, which may be related to underlying right C7 radiculopathy. Pain occurs in R posterior neck with radiation down UT, lateral arm and into dorsal forearm. N/T occurs in R 5th digit and dorsum of hand. Aggravating factors include combing his hair, shoulder flexion or abduction, and extending the wrist back and forth to shave under his chin. Patient today presenting with limited R shoulder AROM and PROM, decreased UE and grip strength, decreased but nearly nonpainful cervical AROM, abnormal posture, and tenderness and soft tissue restriction in R shoulder musculature. Patient educated on gentle stretching and postural correction HEP and advised to avoid pushing into pain. Patient reported understanding. Would benefit from skilled PT services 2x/week for 4 weeks to address aforementioned impairments.    Personal Factors and Comorbidities  Age;Comorbidity 3+;Time since onset of injury/illness/exacerbation;Fitness;Past/Current Experience    Comorbidities  OA,  LBP, HLD, heart murmur, asthma, DM, CAD, anemia, ADD, achilles tenotomy, L heart cath & angio 06/2017, coronary stent 06/2017, C4-6 fusion    Examination-Activity Limitations  Carry;Dressing;Hygiene/Grooming;Lift;Reach Overhead    Examination-Participation Restrictions  Cleaning;Shop;Community Activity;Driving;Yard Work;Interpersonal Relationship;Laundry;Meal Prep    Stability/Clinical Decision Making  Evolving/Moderate complexity    Clinical Decision Making  Moderate    Rehab Potential  Good    PT Frequency  2x / week    PT Duration  4 weeks    PT Treatment/Interventions  ADLs/Self Care Home Management;Cryotherapy;Electrical Stimulation;Moist Heat;Therapeutic exercise;Therapeutic activities;Functional mobility training;Ultrasound;Neuromuscular re-education;Patient/family education;Manual techniques;Vasopneumatic Device;Taping;Energy conservation;Dry needling;Passive range of motion    PT Next Visit Plan  reassess HEP; FOTO    Consulted and Agree with Plan of Care  Patient       Patient will benefit from skilled therapeutic intervention in order to improve the following deficits and impairments:  Hypomobility, Impaired sensation, Decreased activity tolerance, Decreased strength, Increased fascial restricitons, Impaired UE functional use, Pain, Improper body mechanics, Decreased range of motion, Postural dysfunction, Impaired flexibility  Visit Diagnosis: Pain in right arm  Stiffness of right shoulder, not elsewhere classified  Muscle weakness (generalized)  Abnormal posture     Problem List Patient Active Problem List   Diagnosis Date Noted  . Cervical radicular pain 01/10/2019  . Ulnar neuropathy at elbow of right upper extremity 01/03/2019  . Acute bilateral low back pain without sciatica 01/03/2019  . Onychomycosis 05/25/2018  . Palpitations 07/06/2017  . CAD (coronary artery disease) 07/05/2017  . Family history of early CAD 07/05/2017  . Abnormal findings on diagnostic  imaging of cardiovascular system 06/23/2017  . Low vitamin D level 04/22/2017  . Pain in right ankle and joints of right foot 10/05/2016  . Hemarthrosis of right elbow 02/03/2016  . Small thenar eminence 02/03/2016  . Cervical disc disorder with radiculopathy of cervical region 10/07/2015  . Incomplete rotator cuff tear 09/10/2015  .  Nonischemic cardiomyopathy ([place]) 05/15/2015  . Fatigue 05/15/2015  . Dyspnea on exertion 05/15/2015  . Decreased cardiac ejection fraction 04/02/2015  . Strain of latissimus dorsi muscle 01/21/2015  . Shoulder bursitis 10/12/2014  . Stenosis of lateral recess of lumbar spine 10/12/2014  . BPPV (benign paroxysmal positional vertigo) 08/14/2014  . Hyperlipidemia 01/19/2014  . Anemia, unspecified 01/19/2014  . ADD (attention deficit disorder) 01/19/2014  . Type 2 diabetes mellitus ([place]) 12/22/2013  . Hypogonadism in male 12/22/2013  . Arthritis of hand, left 12/22/2013     Janene Harvey, PT, DPT 04/10/19 11:28 AM   [facility] [address]  [place], [place], [address] Phone: [phone]   Fax:  [phone]  Name: MUHAMMADALI WEICK MRN: [id number] Date of Birth: 02/25/1956

## 2019-04-11 ENCOUNTER — Other Ambulatory Visit: Payer: Self-pay

## 2019-04-11 ENCOUNTER — Encounter: Payer: Self-pay | Admitting: Family Medicine

## 2019-04-11 ENCOUNTER — Ambulatory Visit: Payer: 59 | Admitting: Family Medicine

## 2019-04-11 VITALS — BP 122/72 | HR 88 | Temp 97.1°F | Ht 70.0 in | Wt 195.5 lb

## 2019-04-11 DIAGNOSIS — M5412 Radiculopathy, cervical region: Secondary | ICD-10-CM | POA: Diagnosis not present

## 2019-04-11 DIAGNOSIS — M79601 Pain in right arm: Secondary | ICD-10-CM | POA: Diagnosis not present

## 2019-04-11 DIAGNOSIS — E1165 Type 2 diabetes mellitus with hyperglycemia: Secondary | ICD-10-CM

## 2019-04-11 MED ORDER — PREGABALIN 75 MG PO CAPS: 75.0000 mg | ORAL_CAPSULE | Freq: Two times a day (BID) | ORAL | 2 refills | Status: DC

## 2019-04-11 MED FILL — PREGABALIN 75 MG CAPS: 75 | 30 days supply | Qty: 60 | Fill #0

## 2019-04-11 NOTE — Progress Notes (Signed)
Chief Complaint  Patient presents with  . Follow-up    BS this am 192  . Arm Pain    right    Subjective: Patient is a 63 y.o. male here for f/u.  Patient has a history of diabetes.  His average sugars were in the mid 200s at his last visit.  He restarted Farxiga 10 mg daily in addition to Metformin 1000 mg twice daily and Januvia 100 mg daily.  His sugars are currently running in the high 100s.  He is not have any low readings.  He is clean up his diet somewhat.  Exercise is limited due to his upper extremity issues.  He is walking daily.  Of note, he notices that his sugars went up when starting gabapentin.  This medicine is helpful.  He is taking 300 mg 3 times daily.  Patient continues to have right upper extremity weakness and pain.  He was referred to physical medicine rehabilitation but for some reason he was set up with physical therapy.  He had an appointment today and was given the exact same stretches and exercises he had already been through.  He is still interested in seeing a specialist for this.   ROS: Endo: No weight gain MSK: +R arm pain  Past Medical History:  Diagnosis Date  . ADD (attention deficit disorder)   . Anemia   . Arthritis   . CAD (coronary artery disease)    2/19 PCI/DESx1 to Lcx  . Diabetes mellitus without complication ([place])   . Exercise-induced asthma   . Heart murmur   . Hyperlipidemia    family hx of high cholesterol  . Hypogonadism in male   . Low back pain   . Osteoarthritis     Objective: BP 122/72 (BP Location: Left Arm, Patient Position: Sitting, Cuff Size: Normal)   Pulse 88   Temp (!) 97.1 F (36.2 C) (Temporal)   Ht 5\' 10"  (1.778 m)   Wt 195 lb 8 oz (88.7 kg)   SpO2 97%   BMI 28.05 kg/m  General: Awake, appears stated age Heart: RRR, no murmurs Lungs: CTAB, no rales, wheezes or rhonchi. No accessory muscle use Psych: Age appropriate judgment and insight, normal affect and mood  Assessment and Plan: Cervical radicular  pain - Plan: Ambulatory referral to Physical Medicine Rehab, pregabalin (LYRICA) 75 MG capsule  Right arm pain - Plan: Ambulatory referral to Physical Medicine Rehab, pregabalin (LYRICA) 75 MG capsule  Type 2 diabetes mellitus with hyperglycemia, without long-term current use of insulin ([place]) - Plan: HgB A1c  1-I will send another referral to the physical medicine and rehabilitation team.  Emphasized not to refer to physical therapy again.  Replace gabapentin with pregabalin.  Continue home exercise program. 2-check A1c.  Continue Metformin, Farxiga, and Januvia.  He did not do well with Actos in the past.  Would consider glyburide if his sugars do not improve on pregabalin. Follow-up in 3 months pending above. The patient voiced understanding and agreement to the plan.  [place], DO 04/11/19  3:05 PM

## 2019-04-11 NOTE — Patient Instructions (Signed)
Give [place] 2-3 business days to get the results of your labs back.   If you do not hear anything about your referral in the next 1-2 weeks, call our office and ask for an update.  Keep the diet clean and stay active.  Keep checking your sugars.  Let us know if you need anything.

## 2019-04-12 ENCOUNTER — Encounter: Payer: Self-pay | Admitting: Physical Therapy

## 2019-04-12 ENCOUNTER — Ambulatory Visit: Payer: 59 | Admitting: Physical Therapy

## 2019-04-12 DIAGNOSIS — M5412 Radiculopathy, cervical region: Secondary | ICD-10-CM | POA: Diagnosis not present

## 2019-04-12 DIAGNOSIS — R293 Abnormal posture: Secondary | ICD-10-CM

## 2019-04-12 DIAGNOSIS — M6281 Muscle weakness (generalized): Secondary | ICD-10-CM

## 2019-04-12 DIAGNOSIS — M24511 Contracture, right shoulder: Secondary | ICD-10-CM | POA: Diagnosis not present

## 2019-04-12 DIAGNOSIS — M7501 Adhesive capsulitis of right shoulder: Secondary | ICD-10-CM | POA: Diagnosis not present

## 2019-04-12 DIAGNOSIS — G5621 Lesion of ulnar nerve, right upper limb: Secondary | ICD-10-CM | POA: Diagnosis not present

## 2019-04-12 DIAGNOSIS — M25611 Stiffness of right shoulder, not elsewhere classified: Secondary | ICD-10-CM

## 2019-04-12 DIAGNOSIS — M79601 Pain in right arm: Secondary | ICD-10-CM

## 2019-04-12 LAB — HEMOGLOBIN A1C: Hgb A1c MFr Bld: 8.6 % — ABNORMAL HIGH (ref 4.6–6.5)

## 2019-04-12 NOTE — Therapy (Signed)
[facility] [address]  [place], [place], [address] Phone: [phone]   Fax:  [phone]  Physical Therapy Treatment  Patient Details  Name: Reginald Rios MRN: [id number] Date of Birth: 24-Sep-1955 Referring Provider (PT): Crosby [place], [place]   Encounter Date: 04/12/2019  PT End of Session - 04/12/19 1120    Visit Number  2    Number of Visits  9    Date for PT Re-Evaluation  05/08/19    Authorization Type  [facility]    PT Start Time  0931    PT Stop Time  1020   moist heat   PT Time Calculation (min)  49 min    Activity Tolerance  Patient tolerated treatment well;Patient limited by pain    Behavior During Therapy  [facility] for tasks assessed/performed       Past Medical History:  Diagnosis Date  . ADD (attention deficit disorder)   . Anemia   . Arthritis   . CAD (coronary artery disease)    2/19 PCI/DESx1 to Lcx  . Diabetes mellitus without complication ([place])   . Exercise-induced asthma   . Heart murmur   . Hyperlipidemia    family hx of high cholesterol  . Hypogonadism in male   . Low back pain   . Osteoarthritis     Past Surgical History:  Procedure Laterality Date  . CORONARY STENT INTERVENTION N/A 06/23/2017   Procedure: CORONARY STENT INTERVENTION;  Surgeon: Leonie Man, MD;  Location: [facility];  Service: Cardiovascular;  Laterality: N/A;  . INTRAVASCULAR PRESSURE WIRE/FFR STUDY N/A 06/23/2017   Procedure: INTRAVASCULAR PRESSURE WIRE/FFR STUDY;  Surgeon: Leonie Man, MD;  Location: [facility];  Service: Cardiovascular;  Laterality: N/A;  . KNEE ARTHROSCOPY    . LEFT HEART CATH AND CORONARY ANGIOGRAPHY N/A 06/23/2017   Procedure: LEFT HEART CATH AND CORONARY ANGIOGRAPHY;  Surgeon: Leonie Man, MD;  Location: [facility];  Service: Cardiovascular;  Laterality: N/A;  . TENOTOMY ACHILLES TENDON      There were no vitals filed for this visit.  Subjective  Assessment - 04/12/19 0930    Subjective  Took at 2nd walk yesterday and hurt his L LB- thinks that it is his piriformis because after performing stretches that were previously given to him. Now pain is shooting into B sides of his groin. Had difficulty with closing his R fist after performing HEP, which eventually went away.    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 Chronic denervation in right triceps may be related to underlying right C7 radiculopathy.    Patient Stated Goals  get rid of back    Currently in Pain?  Yes    Pain Score  8     Pain Location  Shoulder    Pain Orientation  Right    Pain Descriptors / Indicators  Dull    Pain Radiating Towards  radiating into R pinky    Multiple Pain Sites  Yes    Pain Score  2    Pain Location  Groin    Pain Orientation  Right;Left    Pain Descriptors / Indicators  Sharp    Pain Type  Acute pain  [facility] [place] Adult PT Treatment/Exercise - 04/12/19 0001      Exercises   Exercises  Shoulder      Shoulder Exercises: Supine   Protraction  AAROM;Right;10 reps    Protraction Limitations  with wand; cues to ,maintain elbows straight    External Rotation  AAROM;Right;10 reps    External Rotation Limitations  with wand to tolerance    Internal Rotation  AAROM;Right;10 reps    Internal Rotation Limitations  with wand to tolerance    Flexion  Right;10 reps;AAROM    Flexion Limitations  with wand to tolerance    ABduction  AAROM;Right;10 reps    ABduction Limitations  with wand to tolerance   heavy manual and verbal cues for proper movement     Shoulder Exercises: Sidelying   External Rotation  AROM;Right;10 reps    External Rotation Weight (lbs)  1    External Rotation Limitations  stopping at neutral;  10x 0#; 10x 1#      Shoulder Exercises: Pulleys   Flexion  3 minutes    Flexion Limitations  to tolerance    Scaption  3 minutes    Scaption Limitations  to tolerance      Modalities   Modalities  Moist Heat      Moist Heat Therapy   Number Minutes Moist Heat  10 Minutes    Moist Heat Location  Shoulder   R     Manual Therapy   Manual Therapy  Passive ROM    Manual therapy comments  supine     Passive ROM  R shoulder shoulder PROM in all planes   most difficulty with IR/ER            PT Education - 04/12/19 1013    Education Details  educated patient on avoiding pushing into pain with HEP exercises to avoid flare up of pain    Person(s) Educated  Patient    Methods  Explanation;Demonstration;Tactile cues;Verbal cues    Comprehension  Verbalized understanding;Returned demonstration       PT Short Term Goals - 04/12/19 1123      PT SHORT TERM GOAL #1   Title  Patient to be independent with initial HEP.    Time  2    Period  Weeks    Status  On-going    Target Date  04/24/19        PT Long Term Goals - 04/12/19 1123      PT LONG TERM GOAL #1   Title  Patient to be independent with advanced HEP.    Time  4    Period  Weeks    Status  On-going      PT LONG TERM GOAL #2   Title  Patient to demonstrate [facility] and pain-free R shoulder AROM/PROM.    Time  4    Period  Weeks    Status  On-going      PT LONG TERM GOAL #3   Title  Patient to demonstrate >=4+/5 strength in R UE and demonstrate atleast 40lbs in R grip strength.    Time  4    Period  Weeks    Status  On-going      PT LONG TERM GOAL #4   Title  Patient to report 70% improvement in pain levels with overhead reaching.    Time  4    Period  Weeks    Status  On-going      PT LONG TERM GOAL #5  Title  Patient to report <3/10 pain in R UE with shaving.    Time  4    Period  Weeks    Status  New            Plan - 04/12/19 1120    Clinical Impression Statement  Patient reporting onset  on L LB and buttock pain after increasing his walking distance up a hill yesterday. Reports that this pain has improved, but now noticing shooting pain into the groin. Does report benefit from shoulder HEP despite difficulty with grip. Patient tolerated R shoulder PROM in all directions- most ROM restriction in IR and ER. Required heavy VC's and TC's for correction of form and hand positioning during shoulder AAROM, especially with abduction. Spoke with patient about his tendency to push into pain with exercises and advised not to push further than a 3-4/10 increase in pain. Patient reported understanding. Reported complete relief of R arm pain after ther-ex, with pec pain remaining. Thus, ended session with moist heat to R shoulder and chest for pain relief. Patient without complaints at end of session.    Comorbidities  OA, LBP, HLD, heart murmur, asthma, DM, CAD, anemia, ADD, achilles tenotomy, L heart cath & angio 06/2017, coronary stent 06/2017, C4-6 fusion    PT Treatment/Interventions  ADLs/Self Care Home Management;Cryotherapy;Electrical Stimulation;Moist Heat;Therapeutic exercise;Therapeutic activities;Functional mobility training;Ultrasound;Neuromuscular re-education;Patient/family education;Manual techniques;Vasopneumatic Device;Taping;Energy conservation;Dry needling;Passive range of motion    PT Next Visit Plan  reassess HEP; FOTO    Consulted and Agree with Plan of Care  Patient       Patient will benefit from skilled therapeutic intervention in order to improve the following deficits and impairments:  Hypomobility, Impaired sensation, Decreased activity tolerance, Decreased strength, Increased fascial restricitons, Impaired UE functional use, Pain, Improper body mechanics, Decreased range of motion, Postural dysfunction, Impaired flexibility  Visit Diagnosis: Pain in right arm  Stiffness of right shoulder, not elsewhere classified  Muscle weakness (generalized)  Abnormal  posture     Problem List Patient Active Problem List   Diagnosis Date Noted  . Cervical radicular pain 01/10/2019  . Ulnar neuropathy at elbow of right upper extremity 01/03/2019  . Acute bilateral low back pain without sciatica 01/03/2019  . Onychomycosis 05/25/2018  . Palpitations 07/06/2017  . CAD (coronary artery disease) 07/05/2017  . Family history of early CAD 07/05/2017  . Abnormal findings on diagnostic imaging of cardiovascular system 06/23/2017  . Low vitamin D level 04/22/2017  . Pain in right ankle and joints of right foot 10/05/2016  . Hemarthrosis of right elbow 02/03/2016  . Small thenar eminence 02/03/2016  . Cervical disc disorder with radiculopathy of cervical region 10/07/2015  . Incomplete rotator cuff tear 09/10/2015  . Nonischemic cardiomyopathy ([place]) 05/15/2015  . Fatigue 05/15/2015  . Dyspnea on exertion 05/15/2015  . Decreased cardiac ejection fraction 04/02/2015  . Strain of latissimus dorsi muscle 01/21/2015  . Shoulder bursitis 10/12/2014  . Stenosis of lateral recess of lumbar spine 10/12/2014  . BPPV (benign paroxysmal positional vertigo) 08/14/2014  . Hyperlipidemia 01/19/2014  . Anemia, unspecified 01/19/2014  . ADD (attention deficit disorder) 01/19/2014  . Type 2 diabetes mellitus ([place]) 12/22/2013  . Hypogonadism in male 12/22/2013  . Arthritis of hand, left 12/22/2013    Janene Harvey, PT, DPT 04/12/19 11:25 AM   [facility] [address]  [place], [place], [address] Phone: [phone]   Fax:  [phone]  Name: Reginald Rios MRN: [id number] Date  of Birth: 02/27/1956

## 2019-04-13 ENCOUNTER — Encounter: Payer: Self-pay | Admitting: Allergy and Immunology

## 2019-04-13 ENCOUNTER — Other Ambulatory Visit: Payer: Self-pay

## 2019-04-13 ENCOUNTER — Ambulatory Visit (INDEPENDENT_AMBULATORY_CARE_PROVIDER_SITE_OTHER): Payer: 59 | Admitting: Allergy and Immunology

## 2019-04-13 VITALS — BP 122/60 | HR 100 | Temp 98.2°F | Resp 16 | Ht 69.0 in | Wt 192.7 lb

## 2019-04-13 DIAGNOSIS — J3089 Other allergic rhinitis: Secondary | ICD-10-CM

## 2019-04-13 DIAGNOSIS — J4530 Mild persistent asthma, uncomplicated: Secondary | ICD-10-CM | POA: Insufficient documentation

## 2019-04-13 DIAGNOSIS — R053 Chronic cough: Secondary | ICD-10-CM | POA: Insufficient documentation

## 2019-04-13 DIAGNOSIS — R05 Cough: Secondary | ICD-10-CM

## 2019-04-13 DIAGNOSIS — K219 Gastro-esophageal reflux disease without esophagitis: Secondary | ICD-10-CM

## 2019-04-13 DIAGNOSIS — J4520 Mild intermittent asthma, uncomplicated: Secondary | ICD-10-CM | POA: Diagnosis not present

## 2019-04-13 DIAGNOSIS — R490 Dysphonia: Secondary | ICD-10-CM

## 2019-04-13 HISTORY — DX: Mild persistent asthma, uncomplicated: J45.30

## 2019-04-13 HISTORY — DX: Chronic cough: R05.3

## 2019-04-13 HISTORY — DX: Dysphonia: R49.0

## 2019-04-13 HISTORY — DX: Gastro-esophageal reflux disease without esophagitis: K21.9

## 2019-04-13 HISTORY — DX: Other allergic rhinitis: J30.89

## 2019-04-13 MED ORDER — AZELASTINE & FLUTICASONE 137 & 50 MCG/ACT NA THPK: 1.0000 | PACK | Freq: Two times a day (BID) | NASAL | 5 refills | Status: DC | PRN

## 2019-04-13 MED ORDER — ALBUTEROL SULFATE HFA 108 (90 BASE) MCG/ACT IN AERS: INHALATION_SPRAY | RESPIRATORY_TRACT | 1 refills | Status: DC

## 2019-04-13 MED ORDER — FLOVENT HFA 110 MCG/ACT IN AERO: 2.0000 | INHALATION_SPRAY | Freq: Two times a day (BID) | RESPIRATORY_TRACT | 5 refills | Status: DC

## 2019-04-13 MED ORDER — FAMOTIDINE 20 MG PO TABS: 20.0000 mg | ORAL_TABLET | Freq: Two times a day (BID) | ORAL | 5 refills | Status: DC

## 2019-04-13 MED FILL — FLOVENT HFA 110 MCG INHALER: 110 | 30 days supply | Qty: 12 | Fill #0

## 2019-04-13 MED FILL — ALBUTEROL SULFATE HFA 108 (: 108 (90 BAS | 17 days supply | Qty: 18 | Fill #0

## 2019-04-13 MED FILL — FAMOTIDINE 20 MG TABS: 20 | 30 days supply | Qty: 60 | Fill #0

## 2019-04-13 NOTE — Assessment & Plan Note (Signed)
The history suggests that there may be silent reflux contributing to the cough.  Appropriate reflux lifestyle modifications have been provided.  For now, take famotidine (Pepcid) 20 mg twice daily.  May take Tums as needed.

## 2019-04-13 NOTE — Assessment & Plan Note (Signed)
Silent reflux and/or use of inhaled steroid without a spacer device. There may also be some contribution from poorly perceived post nasal drainage.  Treatment plan as outlined above.

## 2019-04-13 NOTE — Assessment & Plan Note (Signed)
Perennial and seasonal epicutaneous tests were negative despite a positive histamine control.  Intradermal tests were mildly reactive to grass pollen, major mold mix #4, and cockroach antigen.  Aeroallergen avoidance measures have been discussed and provided in written form.  A prescription has been provided for azelastine/fluticasone nasal spray, 1 spray per nostril twice daily as needed. Proper nasal spray technique has been discussed and demonstrated.  If needed, may use Afrin sporadically, however do not use more than 2 or 3 days in a row.  Nasal saline spray (i.e., Simply Saline) or nasal saline lavage (i.e., NeilMed) is recommended as needed and prior to medicated nasal sprays.

## 2019-04-13 NOTE — Patient Instructions (Addendum)
Mild persistent asthma/cough variant asthma Todays spirometry results, assessed while asymptomatic, suggest under-perception of bronchoconstriction.  A prescription has been provided for Flovent (fluticasone) 110 g, 2 inhalations twice a day. To maximize pulmonary deposition, a spacer has been provided along with instructions for its proper administration with an HFA inhaler.  During respiratory tract infections or asthma flares, increase Flovent 110g to 3 inhalations 3 times per day until symptoms have returned to baseline.  Continue albuterol HFA, 1 to 2 inhalations every 4-6 hours if needed.  Subjective and objective measures of pulmonary function will be followed and the treatment plan will be adjusted accordingly.  Allergic rhinitis with a possible nonallergic component Perennial and seasonal epicutaneous tests were negative despite a positive histamine control.  Intradermal tests were mildly reactive to grass pollen, major mold mix #4, and cockroach antigen.  Aeroallergen avoidance measures have been discussed and provided in written form.  A prescription has been provided for azelastine/fluticasone nasal spray, 1 spray per nostril twice daily as needed. Proper nasal spray technique has been discussed and demonstrated.  If needed, may use Afrin sporadically, however do not use more than 2 or 3 days in a row.  Nasal saline spray (i.e., Simply Saline) or nasal saline lavage (i.e., NeilMed) is recommended as needed and prior to medicated nasal sprays.  Acid reflux The history suggests that there may be silent reflux contributing to the cough.  Appropriate reflux lifestyle modifications have been provided.  For now, take famotidine (Pepcid) 20 mg twice daily.  May take Tums as needed.  Cough The most common causes of chronic cough include the following: upper airway cough syndrome (UACS) which is caused by variety of rhinosinus conditions; asthma; gastroesophageal reflux disease  (GERD); chronic bronchitis from cigarette smoking or other inhaled environmental irritants; non-asthmatic eosinophilic bronchitis; and bronchiectasis. In prospective studies, these conditions have accounted for up to 94% of the causes of chronic cough in immunocompetent adults. The history and physical examination suggest that his cough is multifactorial with contribution from bronchial hyperresponsiveness, acid reflux, and possibly some postnasal drainage. We will address these issues at this time.   Treatment plan as outlined above.    We will regroup in 8 weeks to assess treatment response and adjust therapy accordingly.  Hoarseness Silent reflux and/or use of inhaled steroid without a spacer device. There may also be some contribution from poorly perceived post nasal drainage.  Treatment plan as outlined above.   Return in about 2 months (around 06/14/2019), or if symptoms worsen or fail to improve.  Lifestyle Changes for Controlling GERD  When you have GERD, stomach acid feels as if it's backing up toward your mouth. Whether or not you take medication to control your GERD, your symptoms can often be improved with lifestyle changes.   Raise Your Head  Reflux is more likely to strike when you're lying down flat, because stomach fluid can  flow backward more easily. Raising the head of your bed 4-6 inches can help. To do this:  Slide blocks or books under the legs at the head of your bed. Or, place a wedge under  the mattress. Many foam stores can make a suitable wedge for you. The wedge  should run from your waist to the top of your head.  Don't just prop your head on several pillows. This increases pressure on your  stomach. It can make GERD worse.  Watch Your Eating Habits Certain foods may increase the acid in your stomach or relax the lower esophageal  sphincter, making GERD more likely. It's best to avoid the following:  Coffee, tea, and carbonated drinks (with and  without caffeine)  Fatty, fried, or spicy food  Mint, chocolate, onions, and tomatoes  Any other foods that seem to irritate your stomach or cause you pain  Relieve the Pressure  Eat smaller meals, even if you have to eat more often.  Don't lie down right after you eat. Wait a few hours for your stomach to empty.  Avoid tight belts and tight-fitting clothes.  Lose excess weight.  Tobacco and Alcohol  Avoid smoking tobacco and drinking alcohol. They can make GERD symptoms worse.  Reducing Pollen Exposure  The American Academy of Allergy, Asthma and Immunology suggests the following steps to reduce your exposure to pollen during allergy seasons.    1. Do not hang sheets or clothing out to dry; pollen may collect on these items. 2. Do not mow lawns or spend time around freshly cut grass; mowing stirs up pollen. 3. Keep windows closed at night.  Keep car windows closed while driving. 4. Minimize morning activities outdoors, a time when pollen counts are usually at their highest. 5. Stay indoors as much as possible when pollen counts or humidity is high and on windy days when pollen tends to remain in the air longer. 6. Use air conditioning when possible.  Many air conditioners have filters that trap the pollen spores. 7. Use a HEPA room air filter to remove pollen form the indoor air you breathe.   Control of Mold Allergen  Mold and fungi can grow on a variety of surfaces provided certain temperature and moisture conditions exist.  Outdoor molds grow on plants, decaying vegetation and soil.  The major outdoor mold, Alternaria and Cladosporium, are found in very high numbers during hot and dry conditions.  Generally, a late Summer - Fall peak is seen for common outdoor fungal spores.  Rain will temporarily lower outdoor mold spore count, but counts rise rapidly when the rainy period ends.  The most important indoor molds are Aspergillus and Penicillium.  Dark, humid and poorly  ventilated basements are ideal sites for mold growth.  The next most common sites of mold growth are the bathroom and the kitchen.  Outdoor [employer] 5. Use air conditioning and keep windows closed 6. Avoid exposure to decaying vegetation. 7. Avoid leaf raking. 8. Avoid grain handling. 9. Consider wearing a face mask if working in moldy areas.  Indoor Mold Control 2. Maintain humidity below 50%. 3. Clean washable surfaces with 5% bleach solution. 4. Remove sources e.g. Contaminated carpets.  Control of Cockroach Allergen  Cockroach allergen has been identified as an important cause of acute attacks of asthma, especially in urban settings.  There are fifty-five species of cockroach that exist in the [place], however only three, the [place], [place] species produce allergen that can affect patients with Asthma.  Allergens can be obtained from fecal particles, egg casings and secretions from cockroaches.    5. Remove food sources. 6. Reduce access to water. 7. Seal access and entry points. 8. Spray runways with 0.5-1% Diazinon or Chlorpyrifos 9. Blow boric acid power under stoves and refrigerator. 10. Place bait stations (hydramethylnon) at feeding sites.

## 2019-04-13 NOTE — Progress Notes (Signed)
New Patient Note  RE: Reginald Rios MRN: [id number] DOB: [birth year] Date of Office Visit: 04/13/2019  Referring provider: Shelda Rios* Primary care provider: [name], DO  Chief Complaint: Asthma and Laryngitis   History of present illness: Reginald Rios is a 63 y.o. male seen today in consultation requested by Reginald Pal, DO. He complains of a persistent cough.  Cough is typically dry, however has been or productive over the past several days.  The cough is described as originating as a tickle in the upper, central part of the chest.  He occasionally experiences wheezing in association with the cough.  Typically, albuterol HFA provides symptom relief.  The cough is specifically exacerbated by exercise, particularly in cold, dry air, and secondhand cigarette smoke.  He reports that approximately 2 weeks ago his cough increased in frequency/severity and so he started using Advair HFA 115-21 g, 2 inhalations twice a day without a spacer device.  He felt that the Advair helped to control the cough, after 3 days he began to lose his voice, becoming "harder and harder to project" his voice.  Therefore, he discontinued the Advair.  He reports that he was diagnosed with asthma approximately 30 years ago.  He has never required treatment in the emergency department or urgent care for asthma exacerbation and does not recall ever having required systemic steroids for asthma.  He reports that he had been hiking in the mountains earlier this year and experienced dyspnea to a greater degree than his companions of similar conditioning. [name] experiences nasal congestion, rhinorrhea, and occasional postnasal drainage.  His nasal congestion is most significant at nighttime he reports that he takes Afrin nasal spray every other night on average. He reports that he experiences heartburn on rare occasions, typically when he consumes peppers and/or onions.  However, he  notes increased coughing after large meals and has coughed to the point of vomiting after a meal on 2 or 3 occasions over the past 6 months.  Assessment and plan: Mild persistent asthma/cough variant asthma Todays spirometry results, assessed while asymptomatic, suggest under-perception of bronchoconstriction.  A prescription has been provided for Flovent (fluticasone) 110 g, 2 inhalations twice a day. To maximize pulmonary deposition, a spacer has been provided along with instructions for its proper administration with an HFA inhaler.  During respiratory tract infections or asthma flares, increase Flovent 110g to 3 inhalations 3 times per day until symptoms have returned to baseline.  Continue albuterol HFA, 1 to 2 inhalations every 4-6 hours if needed.  Subjective and objective measures of pulmonary function will be followed and the treatment plan will be adjusted accordingly.  Allergic rhinitis with a possible nonallergic component Perennial and seasonal epicutaneous tests were negative despite a positive histamine control.  Intradermal tests were mildly reactive to grass pollen, major mold mix #4, and cockroach antigen.  Aeroallergen avoidance measures have been discussed and provided in written form.  A prescription has been provided for azelastine/fluticasone nasal spray, 1 spray per nostril twice daily as needed. Proper nasal spray technique has been discussed and demonstrated.  If needed, may use Afrin sporadically, however do not use more than 2 or 3 days in a row.  Nasal saline spray (i.e., Simply Saline) or nasal saline lavage (i.e., NeilMed) is recommended as needed and prior to medicated nasal sprays.  Acid reflux The history suggests that there may be silent reflux contributing to the cough.  Appropriate reflux lifestyle modifications have been provided.  For now,  take famotidine (Pepcid) 20 mg twice daily.  May take Tums as needed.  Cough The most common causes of  chronic cough include the following: upper airway cough syndrome (UACS) which is caused by variety of rhinosinus conditions; asthma; gastroesophageal reflux disease (GERD); chronic bronchitis from cigarette smoking or other inhaled environmental irritants; non-asthmatic eosinophilic bronchitis; and bronchiectasis. In prospective studies, these conditions have accounted for up to 94% of the causes of chronic cough in immunocompetent adults. The history and physical examination suggest that his cough is multifactorial with contribution from bronchial hyperresponsiveness, acid reflux, and possibly some postnasal drainage. We will address these issues at this time.   Treatment plan as outlined above.    We will regroup in 8 weeks to assess treatment response and adjust therapy accordingly.  Hoarseness Silent reflux and/or use of inhaled steroid without a spacer device. There may also be some contribution from poorly perceived post nasal drainage.  Treatment plan as outlined above.   Meds ordered this encounter  Medications  . fluticasone (FLOVENT HFA) 110 MCG/ACT inhaler    Sig: Inhale 2 puffs into the lungs 2 (two) times daily. Use with spacer. Rinse, gargle and spit out after use    Dispense:  1 Inhaler    Refill:  5  . albuterol (VENTOLIN HFA) 108 (90 Base) MCG/ACT inhaler    Sig: 2 puffs every 4-6 hours as needed for coughing or wheezing spells    Dispense:  18 g    Refill:  1  . Azelastine & Fluticasone 137 & 50 MCG/ACT THPK    Sig: Place 1 spray into the nose 2 (two) times daily as needed.    Dispense:  1 each    Refill:  5  . famotidine (PEPCID) 20 MG tablet    Sig: Take 1 tablet (20 mg total) by mouth 2 (two) times daily.    Dispense:  60 tablet    Refill:  5    Diagnostics: Spirometry: Spirometry reveals an FVC of 3.33 L and an FEV1 of 2.99 L (91% predicted) with significant (370 mL, 12%) postbronchodilator improvement.  This study was performed while the patient was  asymptomatic.  Please see scanned spirometry results for details. Epicutaneous testing: Negative despite a positive histamine control. Intradermal testing: Mild reactivity to grass pollen, major mold mix #4, and cockroach antigen.   Physical examination: Blood pressure 122/60, pulse 100, temperature 98.2 F (36.8 C), temperature source Oral, resp. rate 16, height 5\' 9"  (1.753 m), weight 192 lb 10.9 oz (87.4 kg), SpO2 98 %.  General: Alert, interactive, in no acute distress. HEENT: TMs pearly gray, turbinates mildly edematous without discharge, post-pharynx moderately erythematous. Neck: Supple without lymphadenopathy. Lungs: Clear to auscultation without wheezing, rhonchi or rales. CV: Normal S1, S2 without murmurs. Abdomen: Nondistended, nontender. Skin: Warm and dry, without lesions or rashes. Extremities:  No clubbing, cyanosis or edema. Neuro:   Grossly intact.  Review of systems:  Review of systems negative except as noted in HPI / PMHx or noted below: Review of Systems  Constitutional: Negative.   HENT: Negative.   Eyes: Negative.   Respiratory: Negative.   Cardiovascular: Negative.   Gastrointestinal: Negative.   Genitourinary: Negative.   Musculoskeletal: Negative.   Skin: Negative.   Neurological: Negative.   Endo/Heme/Allergies: Negative.   Psychiatric/Behavioral: Negative.     Past medical history:  Past Medical History:  Diagnosis Date  . ADD (attention deficit disorder)   . Anemia   . Arthritis   . CAD (coronary artery  disease)    2/19 PCI/DESx1 to Lcx  . Diabetes mellitus without complication ([place])   . Exercise-induced asthma   . Heart murmur   . Hyperlipidemia    family hx of high cholesterol  . Hypogonadism in male   . Low back pain   . Osteoarthritis     Past surgical history:  Past Surgical History:  Procedure Laterality Date  . CORONARY STENT INTERVENTION N/A 06/23/2017   Procedure: CORONARY STENT INTERVENTION;  Surgeon: Leonie Man,  MD;  Location: [facility];  Service: Cardiovascular;  Laterality: N/A;  . INTRAVASCULAR PRESSURE WIRE/FFR STUDY N/A 06/23/2017   Procedure: INTRAVASCULAR PRESSURE WIRE/FFR STUDY;  Surgeon: Leonie Man, MD;  Location: [facility];  Service: Cardiovascular;  Laterality: N/A;  . KNEE ARTHROSCOPY    . LEFT HEART CATH AND CORONARY ANGIOGRAPHY N/A 06/23/2017   Procedure: LEFT HEART CATH AND CORONARY ANGIOGRAPHY;  Surgeon: Leonie Man, MD;  Location: [facility];  Service: Cardiovascular;  Laterality: N/A;  . TENOTOMY ACHILLES TENDON      Family history: Family History  Problem Relation Age of Onset  . Lung cancer Mother 77  . Cancer Mother        lung cancer  . Coronary artery disease Father 68  . Heart disease Father   . Hypothyroidism Brother   . Food Allergy Son   . Asthma Paternal Aunt   . Colon cancer Neg Hx   . Prostate cancer Neg Hx   . Allergic rhinitis Neg Hx   . Angioedema Neg Hx   . Eczema Neg Hx   . Immunodeficiency Neg Hx   . Urticaria Neg Hx     Social history: Social History   Socioeconomic History  . Marital status: Married    Spouse name: Not on file  . Number of children: Not on file  . Years of education: Not on file  . Highest education level: Not on file  Occupational History  . Occupation: Air cabin crew   Tobacco Use  . Smoking status: Never Smoker  . Smokeless tobacco: Never Used  Substance and Sexual Activity  . Alcohol use: Yes    Alcohol/week: 0.0 standard drinks    Comment: rare  . Drug use: No  . Sexual activity: Not on file  Other Topics Concern  . Not on file  Social History Narrative   Previously worked as Geophysicist/field seismologist   Currently working at [employer] clinic   Married 30 years   2 children ages 64, 39   Wife is psychiatrist - Dr. Margurite Auerbach   Moved from [place] in [place] Determinants of Health   Financial Resource Strain:   . Difficulty of Paying Living Expenses: Not on file   Food Insecurity:   . Worried About Charity fundraiser in the Last Year: Not on file  . Ran Out of Food in the Last Year: Not on file  Transportation Needs:   . Lack of Transportation (Medical): Not on file  . Lack of Transportation (Non-Medical): Not on file  Physical Activity:   . Days of Exercise per Week: Not on file  . Minutes of Exercise per Session: Not on file  Stress:   . Feeling of Stress : Not on file  Social Connections:   . Frequency of Communication with Friends and Family: Not on file  . Frequency of Social Gatherings with Friends and Family: Not on file  . Attends Religious Services:  Not on file  . Active Member of Clubs or Organizations: Not on file  . Attends Archivist Meetings: Not on file  . Marital Status: Not on file  Intimate Partner Violence:   . Fear of Current or Ex-Partner: Not on file  . Emotionally Abused: Not on file  . Physically Abused: Not on file  . Sexually Abused: Not on file    Environmental History: The patient lives in a 65 year old house with hardwood floors throughout, gassy, and central air.  It is unclear if there is mold/water damage in the home.  There is a dog in the home which does not have access to his bedroom.  He is a non-smoker.  Current Outpatient Medications  Medication Sig Dispense Refill  . aspirin EC 81 MG tablet Take 81 mg by mouth daily.    . baclofen (LIORESAL) 10 MG tablet Take 0.5 tablets (5 mg total) by mouth 2 (two) times daily as needed for muscle spasms. 30 each 0  . clopidogrel (PLAVIX) 75 MG tablet TAKE 1 TABLET (75 MG TOTAL) BY MOUTH DAILY WITH BREAKFAST. 90 tablet 2  . dapagliflozin propanediol (FARXIGA) 10 MG TABS tablet Take 10 mg by mouth daily. 30 tablet 5  . ezetimibe (ZETIA) 10 MG tablet TAKE 1 TABLET (10 MG TOTAL) BY MOUTH DAILY. 90 tablet 0  . fluticasone-salmeterol (ADVAIR HFA) 115-21 MCG/ACT inhaler Inhale 2 puffs into the lungs as needed.    . lidocaine (LIDODERM) 5 % Place 1 patch onto  the skin daily. Remove & Discard patch within 12 hours or as directed by MD 30 patch 0  . losartan (COZAAR) 25 MG tablet TAKE 1 TABLET (25 MG TOTAL) BY MOUTH DAILY. 30 tablet 4  . metFORMIN (GLUCOPHAGE) 1000 MG tablet Take 1 tablet (1,000 mg total) by mouth 2 (two) times daily with a meal. 180 tablet 3  . methylphenidate (RITALIN) 20 MG tablet TAKE 1 TABLET BY MOUTH ONCE DAILY 30 tablet 0  . montelukast (SINGULAIR) 10 MG tablet Take 10 mg by mouth daily.  0  . nitroGLYCERIN (NITROSTAT) 0.4 MG SL tablet Place 1 tablet (0.4 mg total) under the tongue every 5 (five) minutes as needed. 25 tablet 2  . pravastatin (PRAVACHOL) 20 MG tablet Take 1 tablet (20 mg total) by mouth daily. 90 tablet 2  . pregabalin (LYRICA) 75 MG capsule Take 1 capsule (75 mg total) by mouth 2 (two) times daily. 60 capsule 2  . sildenafil (VIAGRA) 50 MG tablet TAKE 1 TABLET (50 MG TOTAL) BY MOUTH DAILY AS NEEDED FOR ERECTILE DYSFUNCTION. 5 tablet 10  . sitaGLIPtin (JANUVIA) 100 MG tablet Take 1 tablet (100 mg total) by mouth daily. 30 tablet 3  . testosterone (ANDROGEL) 50 MG/5GM (1%) GEL APPLY 2 APPLICATIONS (10 GRAMS) ONTO THE SKIN DAILY 300 g 5  . VENTOLIN HFA 108 (90 Base) MCG/ACT inhaler Inhale 1 puff into the lungs every 6 (six) hours as needed (cough). 2 each 4  . albuterol (VENTOLIN HFA) 108 (90 Base) MCG/ACT inhaler 2 puffs every 4-6 hours as needed for coughing or wheezing spells 18 g 1  . Azelastine & Fluticasone 137 & 50 MCG/ACT THPK Place 1 spray into the nose 2 (two) times daily as needed. 1 each 5  . famotidine (PEPCID) 20 MG tablet Take 1 tablet (20 mg total) by mouth 2 (two) times daily. 60 tablet 5  . fluticasone (FLOVENT HFA) 110 MCG/ACT inhaler Inhale 2 puffs into the lungs 2 (two) times daily. Use with spacer.  Rinse, gargle and spit out after use 1 Inhaler 5  . Fluticasone-Salmeterol (ADVAIR DISKUS) 250-50 MCG/DOSE AEPB Inhale 1 puff into the lungs 2 (two) times daily for 14 days. (Patient not taking: Reported  on 04/13/2019) 60 each 0   No current facility-administered medications for this visit.    Known medication allergies: Allergies  Allergen Reactions  . Invokana [Canagliflozin] Rash  . Statins Rash and Other (See Comments)    Muscle aches   . Lisinopril Cough    I appreciate the opportunity to take part in Bradrick's care. Please do not hesitate to contact me with questions.  Sincerely,   R. Edgar Frisk, MD

## 2019-04-13 NOTE — Assessment & Plan Note (Signed)
The most common causes of chronic cough include the following: upper airway cough syndrome (UACS) which is caused by variety of rhinosinus conditions; asthma; gastroesophageal reflux disease (GERD); chronic bronchitis from cigarette smoking or other inhaled environmental irritants; non-asthmatic eosinophilic bronchitis; and bronchiectasis. In prospective studies, these conditions have accounted for up to 94% of the causes of chronic cough in immunocompetent adults. The history and physical examination suggest that his cough is multifactorial with contribution from bronchial hyperresponsiveness, acid reflux, and possibly some postnasal drainage. We will address these issues at this time.   Treatment plan as outlined above.    We will regroup in 8 weeks to assess treatment response and adjust therapy accordingly.

## 2019-04-13 NOTE — Assessment & Plan Note (Signed)
Todays spirometry results, assessed while asymptomatic, suggest under-perception of bronchoconstriction.  A prescription has been provided for Flovent (fluticasone) 110 g, 2 inhalations twice a day. To maximize pulmonary deposition, a spacer has been provided along with instructions for its proper administration with an HFA inhaler.  During respiratory tract infections or asthma flares, increase Flovent 110g to 3 inhalations 3 times per day until symptoms have returned to baseline.  Continue albuterol HFA, 1 to 2 inhalations every 4-6 hours if needed.  Subjective and objective measures of pulmonary function will be followed and the treatment plan will be adjusted accordingly.

## 2019-04-17 ENCOUNTER — Ambulatory Visit: Payer: 59

## 2019-04-17 ENCOUNTER — Other Ambulatory Visit: Payer: Self-pay

## 2019-04-17 DIAGNOSIS — M79601 Pain in right arm: Secondary | ICD-10-CM

## 2019-04-17 DIAGNOSIS — M7501 Adhesive capsulitis of right shoulder: Secondary | ICD-10-CM | POA: Diagnosis not present

## 2019-04-17 DIAGNOSIS — G5621 Lesion of ulnar nerve, right upper limb: Secondary | ICD-10-CM | POA: Diagnosis not present

## 2019-04-17 DIAGNOSIS — M5412 Radiculopathy, cervical region: Secondary | ICD-10-CM | POA: Diagnosis not present

## 2019-04-17 DIAGNOSIS — M25611 Stiffness of right shoulder, not elsewhere classified: Secondary | ICD-10-CM

## 2019-04-17 DIAGNOSIS — M24511 Contracture, right shoulder: Secondary | ICD-10-CM | POA: Diagnosis not present

## 2019-04-17 DIAGNOSIS — M6281 Muscle weakness (generalized): Secondary | ICD-10-CM

## 2019-04-17 DIAGNOSIS — R293 Abnormal posture: Secondary | ICD-10-CM

## 2019-04-17 MED FILL — JANUVIA 100 MG TABLET: 100 | 30 days supply | Qty: 30 | Fill #2

## 2019-04-17 NOTE — Therapy (Signed)
[facility] [address]  [place], [place], [address] Phone: [phone]   Fax:  [phone]  Physical Therapy Treatment  Patient Details  Name: Reginald Rios MRN: [id number] Date of Birth: 04-10-1956 Referring Provider (PT): Crosby Oyster [place], [place]   Encounter Date: 04/17/2019  PT End of Session - 04/17/19 1114    Visit Number  3    Number of Visits  9    Date for PT Re-Evaluation  05/08/19    Authorization Type  [facility]    PT Start Time  1102    PT Stop Time  1155    PT Time Calculation (min)  53 min    Activity Tolerance  Patient tolerated treatment well;Patient limited by pain    Behavior During Therapy  [facility] for tasks assessed/performed       Past Medical History:  Diagnosis Date  . ADD (attention deficit disorder)   . Anemia   . Arthritis   . CAD (coronary artery disease)    2/19 PCI/DESx1 to Lcx  . Diabetes mellitus without complication ([place])   . Exercise-induced asthma   . Heart murmur   . Hyperlipidemia    family hx of high cholesterol  . Hypogonadism in male   . Low back pain   . Osteoarthritis     Past Surgical History:  Procedure Laterality Date  . CORONARY STENT INTERVENTION N/A 06/23/2017   Procedure: CORONARY STENT INTERVENTION;  Surgeon: Leonie Man, MD;  Location: [place] [facility];  Service: Cardiovascular;  Laterality: N/A;  . INTRAVASCULAR PRESSURE WIRE/FFR STUDY N/A 06/23/2017   Procedure: INTRAVASCULAR PRESSURE WIRE/FFR STUDY;  Surgeon: Leonie Man, MD;  Location: [place] [facility];  Service: Cardiovascular;  Laterality: N/A;  . KNEE ARTHROSCOPY    . LEFT HEART CATH AND CORONARY ANGIOGRAPHY N/A 06/23/2017   Procedure: LEFT HEART CATH AND CORONARY ANGIOGRAPHY;  Surgeon: Leonie Man, MD;  Location: [facility] [facility];  Service: Cardiovascular;  Laterality: N/A;  . TENOTOMY ACHILLES TENDON      There were no vitals filed for this visit.  Subjective Assessment  - 04/17/19 1108    Subjective  Feels like his R UE pain has improved somewhat.  Pt. noting he lifted 20# boxes of CDs yesterday and felt fine.    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 Chronic denervation in right triceps may be related to underlying right C7 radiculopathy.    Patient Stated Goals  get rid of back    Currently in Pain?  Yes    Pain Score  7     Pain Location  Shoulder    Pain Orientation  Right;Lateral    Pain Descriptors / Indicators  Dull    Pain Type  Chronic pain    Pain Radiating Towards  Radiating into R pinky    Pain Onset  More than a month ago    Pain Frequency  Constant    Multiple Pain Sites  No         [facility] PT Assessment - 04/17/19 0001      Observation/Other Assessments   Focus on Therapeutic Outcomes (FOTO)   47%(53% limitation)  [facility] Adult PT Treatment/Exercise - 04/17/19 0001      Shoulder Exercises: Supine   Protraction  Right;15 reps;Strengthening;Weights    Protraction Weight (lbs)  2    Protraction Limitations  R AROM    External Rotation  AAROM;Right;10 reps    External Rotation Limitations  with wand to tolerance    Flexion  Right;10 reps;AAROM    Flexion Limitations  with wand to tolerance      Shoulder Exercises: Sidelying   External Rotation  AROM;Right;10 reps    External Rotation Limitations  poor scap. retraction ROM    ABduction  Right;10 reps    ABduction Limitations  cues for scap. upward rotation     Other Sidelying Exercises  L sidelying R horiz, abduction x 10 rpes    good scap. retraction      Shoulder Exercises: Pulleys   Flexion  3 minutes    Flexion Limitations  to tolerance    Scaption  3 minutes    Scaption Limitations  to tolerance      Manual Therapy   Manual Therapy   Passive ROM;Soft tissue mobilization;Myofascial release    Manual therapy comments  supine and seated     Soft tissue mobilization  STM/DTM to R UT, LS, R cervical paraspinals, R pec     Myofascial Release  TPR to R UT, LS, R pec     Passive ROM  R shoulder shoulder PROM in all planes               PT Short Term Goals - 04/17/19 1148      PT SHORT TERM GOAL #1   Title  Patient to be independent with initial HEP.    Time  2    Period  Weeks    Status  Achieved    Target Date  04/24/19        PT Long Term Goals - 04/12/19 1123      PT LONG TERM GOAL #1   Title  Patient to be independent with advanced HEP.    Time  4    Period  Weeks    Status  On-going      PT LONG TERM GOAL #2   Title  Patient to demonstrate [facility] and pain-free R shoulder AROM/PROM.    Time  4    Period  Weeks    Status  On-going      PT LONG TERM GOAL #3   Title  Patient to demonstrate >=4+/5 strength in R UE and demonstrate atleast 40lbs in R grip strength.    Time  4    Period  Weeks    Status  On-going      PT LONG TERM GOAL #4   Title  Patient to report 70% improvement in pain levels with overhead reaching.    Time  4    Period  Weeks    Status  On-going      PT LONG TERM GOAL #5   Title  Patient to report <3/10 pain in R UE with shaving.    Time  4    Period  Weeks    Status  New            Plan - 04/17/19 1119    Clinical Impression Statement  Pt. noting his HEP is going well and he feels he is getting some relief from performance of HEP.  STG #1 achieved.  Pt. starting out session with subjective report of 7/10 lateral  shoulder/R UE pain which subsided to 0/10 report by end of ROM and gentle shoulder strengthening.  Pt. noting intermittent increase in R UE/hand numbness with sidelying therex throughout session which recovered to baseline when in resting positioning.  MT addressed palpable TPs in R mid UT, LS with pt. tolerating well.  Mang noting benefit from STM/DTM today and  requesting this further in coming sessions.    Comorbidities  OA, LBP, HLD, heart murmur, asthma, DM, CAD, anemia, ADD, achilles tenotomy, L heart cath & angio 06/2017, coronary stent 06/2017, C4-6 fusion    Rehab Potential  Good    PT Treatment/Interventions  ADLs/Self Care Home Management;Cryotherapy;Electrical Stimulation;Moist Heat;Therapeutic exercise;Therapeutic activities;Functional mobility training;Ultrasound;Neuromuscular re-education;Patient/family education;Manual techniques;Vasopneumatic Device;Taping;Energy conservation;Dry needling;Passive range of motion    PT Next Visit Plan  --    Consulted and Agree with Plan of Care  Patient       Patient will benefit from skilled therapeutic intervention in order to improve the following deficits and impairments:  Hypomobility, Impaired sensation, Decreased activity tolerance, Decreased strength, Increased fascial restricitons, Impaired UE functional use, Pain, Improper body mechanics, Decreased range of motion, Postural dysfunction, Impaired flexibility  Visit Diagnosis: Pain in right arm  Stiffness of right shoulder, not elsewhere classified  Muscle weakness (generalized)  Abnormal posture     Problem List Patient Active Problem List   Diagnosis Date Noted  . Mild persistent asthma/cough variant asthma 04/13/2019  . Allergic rhinitis with a possible nonallergic component 04/13/2019  . Acid reflux 04/13/2019  . Cough 04/13/2019  . Hoarseness 04/13/2019  . Cervical radicular pain 01/10/2019  . Ulnar neuropathy at elbow of right upper extremity 01/03/2019  . Acute bilateral low back pain without sciatica 01/03/2019  . Onychomycosis 05/25/2018  . Palpitations 07/06/2017  . CAD (coronary artery disease) 07/05/2017  . Family history of early CAD 07/05/2017  . Abnormal findings on diagnostic imaging of cardiovascular system 06/23/2017  . Low vitamin D level 04/22/2017  . Pain in right ankle and joints of right foot 10/05/2016   . Hemarthrosis of right elbow 02/03/2016  . Small thenar eminence 02/03/2016  . Cervical disc disorder with radiculopathy of cervical region 10/07/2015  . Incomplete rotator cuff tear 09/10/2015  . Nonischemic cardiomyopathy ([place]) 05/15/2015  . Fatigue 05/15/2015  . Dyspnea on exertion 05/15/2015  . Decreased cardiac ejection fraction 04/02/2015  . Strain of latissimus dorsi muscle 01/21/2015  . Shoulder bursitis 10/12/2014  . Stenosis of lateral recess of lumbar spine 10/12/2014  . BPPV (benign paroxysmal positional vertigo) 08/14/2014  . Hyperlipidemia 01/19/2014  . Anemia, unspecified 01/19/2014  . ADD (attention deficit disorder) 01/19/2014  . Type 2 diabetes mellitus ([place]) 12/22/2013  . Hypogonadism in male 12/22/2013  . Arthritis of hand, left 12/22/2013    Bess Harvest, PTA 04/17/19 12:36 PM   [facility] [address]  [place], [place], [address] Phone: [phone]   Fax:  [phone]  Name: Reginald Rios MRN: [id number] Date of Birth: [birth year]

## 2019-04-19 ENCOUNTER — Other Ambulatory Visit: Payer: Self-pay

## 2019-04-19 ENCOUNTER — Ambulatory Visit: Payer: 59

## 2019-04-19 DIAGNOSIS — M6281 Muscle weakness (generalized): Secondary | ICD-10-CM

## 2019-04-19 DIAGNOSIS — M7501 Adhesive capsulitis of right shoulder: Secondary | ICD-10-CM | POA: Diagnosis not present

## 2019-04-19 DIAGNOSIS — G5621 Lesion of ulnar nerve, right upper limb: Secondary | ICD-10-CM | POA: Diagnosis not present

## 2019-04-19 DIAGNOSIS — M25611 Stiffness of right shoulder, not elsewhere classified: Secondary | ICD-10-CM

## 2019-04-19 DIAGNOSIS — R293 Abnormal posture: Secondary | ICD-10-CM

## 2019-04-19 DIAGNOSIS — M79601 Pain in right arm: Secondary | ICD-10-CM

## 2019-04-19 DIAGNOSIS — M24511 Contracture, right shoulder: Secondary | ICD-10-CM | POA: Diagnosis not present

## 2019-04-19 DIAGNOSIS — M5412 Radiculopathy, cervical region: Secondary | ICD-10-CM | POA: Diagnosis not present

## 2019-04-19 NOTE — Therapy (Signed)
[facility] [address]  [place], [place], [address] Phone: [phone]   Fax:  [phone]  Physical Therapy Treatment  Patient Details  Name: Reginald Rios MRN: [id number] Date of Birth: 07-14-1955 Referring Provider (PT): Crosby Oyster [place], [place]   Encounter Date: 04/19/2019  PT End of Session - 04/19/19 1113    Visit Number  4    Number of Visits  9    Date for PT Re-Evaluation  05/08/19    Authorization Type  [facility]    PT Start Time  1108    PT Stop Time  1153    PT Time Calculation (min)  45 min    Activity Tolerance  Patient tolerated treatment well;Patient limited by pain    Behavior During Therapy  [facility] for tasks assessed/performed       Past Medical History:  Diagnosis Date  . ADD (attention deficit disorder)   . Anemia   . Arthritis   . CAD (coronary artery disease)    2/19 PCI/DESx1 to Lcx  . Diabetes mellitus without complication ([place]inal[place] Rios)   . Exercise-induced asthma   . Heart murmur   . Hyperlipidemia    family hx of high cholesterol  . Hypogonadism in male   . Low back pain   . Osteoarthritis     Past Surgical History:  Procedure Laterality Date  . CORONARY STENT INTERVENTION N/A 06/23/2017   Procedure: CORONARY STENT INTERVENTION;  Surgeon: Leonie Man, MD;  Location: [facility];  Service: Cardiovascular;  Laterality: N/A;  . INTRAVASCULAR PRESSURE WIRE/FFR STUDY N/A 06/23/2017   Procedure: INTRAVASCULAR PRESSURE WIRE/FFR STUDY;  Surgeon: Leonie Man, MD;  Location: [facility];  Service: Cardiovascular;  Laterality: N/A;  . KNEE ARTHROSCOPY    . LEFT HEART CATH AND CORONARY ANGIOGRAPHY N/A 06/23/2017   Procedure: LEFT HEART CATH AND CORONARY ANGIOGRAPHY;  Surgeon: Leonie Man, MD;  Location: [place];  Service: Cardiovascular;  Laterality: N/A;  . TENOTOMY ACHILLES TENDON      There were no vitals filed for this visit.  Subjective Assessment  - 04/19/19 1110    Subjective  Had to "hammer" overhead on outside of house with increased shoulder pain after.    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 Chronic denervation in right triceps may be related to underlying right C7 radiculopathy.    Patient Stated Goals  get rid of back    Currently in Pain?  Yes    Pain Score  9     Pain Location  Shoulder    Pain Orientation  Right;Lateral    Pain Descriptors / Indicators  Dull    Pain Type  Chronic pain    Pain Radiating Towards  radiating into R pinky and R lateral hand    Pain Onset  More than a month ago    Pain Frequency  Constant    Aggravating Factors   Hammering nails while up on ladder    Multiple Pain Sites  No         [facility] PT Assessment - 04/19/19 0001      Assessment   Medical Diagnosis  R arm pain    Referring Provider (PT)  [name], DO  Onset Date/Surgical Date  06/04/18    Hand Dominance  Right    Next MD Visit  07/11/18    Prior Therapy  yes- neck                   [facility] Adult PT Treatment/Exercise - 04/19/19 0001      Shoulder Exercises: Supine   Protraction  Right;15 reps;Strengthening;Weights    Protraction Weight (lbs)  3    Protraction Limitations  R AROM    External Rotation  AAROM;Right;10 reps    External Rotation Limitations  with wand to tolerance    Flexion  Right;10 reps;AAROM    Flexion Limitations  with wand to tolerance    ABduction Limitations  with wand to tolerance      Shoulder Exercises: Pulleys   Flexion  3 minutes    Flexion Limitations  to tolerance    Scaption  3 minutes    Scaption Limitations  to tolerance      Shoulder Exercises: Stretch   Cross Chest Stretch  1 rep;20 seconds    Cross Chest Stretch Limitations  gentle - increased numbness  thus terminated       Manual Therapy   Manual Therapy  Passive ROM;Soft tissue mobilization;Myofascial release    Manual therapy comments  supine and seated     Soft tissue mobilization  STM/DTM to R UT, LS, R cervical paraspinals, R pec     Myofascial Release  TPR to R UT, LS     Passive ROM  R shoulder shoulder PROM in all planes               PT Short Term Goals - 04/17/19 1148      PT SHORT TERM GOAL #1   Title  Patient to be independent with initial HEP.    Time  2    Period  Weeks    Status  Achieved    Target Date  04/24/19        PT Long Term Goals - 04/19/19 1132      PT LONG TERM GOAL #1   Title  Patient to be independent with advanced HEP.    Time  4    Period  Weeks    Status  On-going      PT LONG TERM GOAL #2   Title  Patient to demonstrate [facility] and pain-free R shoulder AROM/PROM.    Time  4    Period  Weeks    Status  On-going      PT LONG TERM GOAL #3   Title  Patient to demonstrate >=4+/5 strength in R UE and demonstrate atleast 40lbs in R grip strength.    Time  4    Period  Weeks    Status  On-going      PT LONG TERM GOAL #4   Title  Patient to report 70% improvement in pain levels with overhead reaching.    Time  4    Period  Weeks    Status  On-going      PT LONG TERM GOAL #5   Title  Patient to report <3/10 pain in R UE with shaving.    Time  4    Period  Weeks    Status  On-going            Plan - 04/19/19 1130    Clinical Impression Statement  Barclay reporting he had to "hammer some nails in on a ladder" yesterday which  he feels flared his R shoulder/UE pain levels up.  Able to get good pain relief and improved ROM with gentle AAROM and MT addressing increased R UT, posterior RTC tension/tenderness.  Pt. encouraged to avoid activities with R shoulder which cause excessive shoulder strain.  Pt. verbalized understanding.  Joao noting some relief from HEP activities and notes some of his R "triceps" pain is subsiding.  Will  continue with postural/strengthening activities per pt. tolerance in coming sessions.    Comorbidities  OA, LBP, HLD, heart murmur, asthma, DM, CAD, anemia, ADD, achilles tenotomy, L heart cath & angio 06/2017, coronary stent 06/2017, C4-6 fusion    Rehab Potential  Good    PT Treatment/Interventions  ADLs/Self Care Home Management;Cryotherapy;Electrical Stimulation;Moist Heat;Therapeutic exercise;Therapeutic activities;Functional mobility training;Ultrasound;Neuromuscular re-education;Patient/family education;Manual techniques;Vasopneumatic Device;Taping;Energy conservation;Dry needling;Passive range of motion    PT Next Visit Plan  progress postural and R shoulder strengthening activities pt pt. tolerance; progress ROM activities; modalities for pain relief prn    Consulted and Agree with Plan of Care  Patient       Patient will benefit from skilled therapeutic intervention in order to improve the following deficits and impairments:  Hypomobility, Impaired sensation, Decreased activity tolerance, Decreased strength, Increased fascial restricitons, Impaired UE functional use, Pain, Improper body mechanics, Decreased range of motion, Postural dysfunction, Impaired flexibility  Visit Diagnosis: Pain in right arm  Stiffness of right shoulder, not elsewhere classified  Muscle weakness (generalized)  Abnormal posture     Problem List Patient Active Problem List   Diagnosis Date Noted  . Mild persistent asthma/cough variant asthma 04/13/2019  . Allergic rhinitis with a possible nonallergic component 04/13/2019  . Acid reflux 04/13/2019  . Cough 04/13/2019  . Hoarseness 04/13/2019  . Cervical radicular pain 01/10/2019  . Ulnar neuropathy at elbow of right upper extremity 01/03/2019  . Acute bilateral low back pain without sciatica 01/03/2019  . Onychomycosis 05/25/2018  . Palpitations 07/06/2017  . CAD (coronary artery disease) 07/05/2017  . Family history of early CAD 07/05/2017  .  Abnormal findings on diagnostic imaging of cardiovascular system 06/23/2017  . Low vitamin D level 04/22/2017  . Pain in right ankle and joints of right foot 10/05/2016  . Hemarthrosis of right elbow 02/03/2016  . Small thenar eminence 02/03/2016  . Cervical disc disorder with radiculopathy of cervical region 10/07/2015  . Incomplete rotator cuff tear 09/10/2015  . Nonischemic cardiomyopathy ([place]) 05/15/2015  . Fatigue 05/15/2015  . Dyspnea on exertion 05/15/2015  . Decreased cardiac ejection fraction 04/02/2015  . Strain of latissimus dorsi muscle 01/21/2015  . Shoulder bursitis 10/12/2014  . Stenosis of lateral recess of lumbar spine 10/12/2014  . BPPV (benign paroxysmal positional vertigo) 08/14/2014  . Hyperlipidemia 01/19/2014  . Anemia, unspecified 01/19/2014  . ADD (attention deficit disorder) 01/19/2014  . Type 2 diabetes mellitus ([place]) 12/22/2013  . Hypogonadism in male 12/22/2013  . Arthritis of hand, left 12/22/2013    Bess Harvest, PTA 04/19/19 12:15 PM   [facility] [address]  [place], [place], [address] Phone: [phone]   Fax:  [phone]  Name: ALEXS BIFANO MRN: [id number] Date of Birth: [birth year]

## 2019-04-24 ENCOUNTER — Encounter: Payer: Self-pay | Admitting: Physical Medicine & Rehabilitation

## 2019-04-24 ENCOUNTER — Other Ambulatory Visit: Payer: Self-pay

## 2019-04-24 ENCOUNTER — Ambulatory Visit: Payer: 59

## 2019-04-24 ENCOUNTER — Encounter: Payer: 59 | Attending: Physical Medicine & Rehabilitation | Admitting: Physical Medicine & Rehabilitation

## 2019-04-24 VITALS — BP 120/71 | HR 76 | Temp 97.7°F | Ht 70.0 in | Wt 194.0 lb

## 2019-04-24 DIAGNOSIS — G5621 Lesion of ulnar nerve, right upper limb: Secondary | ICD-10-CM | POA: Diagnosis not present

## 2019-04-24 DIAGNOSIS — M24511 Contracture, right shoulder: Secondary | ICD-10-CM | POA: Diagnosis not present

## 2019-04-24 DIAGNOSIS — M25611 Stiffness of right shoulder, not elsewhere classified: Secondary | ICD-10-CM

## 2019-04-24 DIAGNOSIS — M7501 Adhesive capsulitis of right shoulder: Secondary | ICD-10-CM | POA: Diagnosis not present

## 2019-04-24 DIAGNOSIS — M5412 Radiculopathy, cervical region: Secondary | ICD-10-CM | POA: Diagnosis not present

## 2019-04-24 DIAGNOSIS — M6281 Muscle weakness (generalized): Secondary | ICD-10-CM

## 2019-04-24 DIAGNOSIS — R293 Abnormal posture: Secondary | ICD-10-CM

## 2019-04-24 DIAGNOSIS — M79601 Pain in right arm: Secondary | ICD-10-CM

## 2019-04-24 NOTE — Progress Notes (Signed)
Subjective:    Patient ID: Reginald Rios, male    DOB: 1955-06-07, 63 y.o.   MRN: [id number]  HPI   RIght sided neck and shoulder pain   Onset approximately 9 months ago with backhand low with high finish Numbness and tingling occurred in the shoulder area initially   Hx of C4-6 ACDF Dr. Ellene Route  Physical therapy this am , manipulated the shoulder and trapezius area, heat and TENs unit  Pinky has stayed numb for "5 yrs" diagnosed with chronic RIght ulnar neuropathy  EMG evidence of chronic denervation of the Right triceps as well as right ulnar nerve distribution.  This was performed at [facility] neurology on 12/29/2018  The patient has been evaluated by primary care sports medicine and started on gabapentin after the EMG.  The patient does not feel that the gabapentin was very effective for his symptoms. 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  He has not been able to pay tennis for several months.  He has tried pickle ball which he can do.  His goal is to return to tennis and continue teaching.   Pain Inventory Average Pain 7 Pain Right Now 6 My pain is constant, sharp, burning, dull, stabbing, tingling and aching  In the last 24 hours, has pain interfered with the following? General activity 8 Relation with others 5 Enjoyment of life 9 What TIME of day is your pain at its worst? morning Sleep (in general) Fair  Pain is worse with: inactivity and some activites Pain improves with: heat/ice, therapy/exercise, medication and TENS Relief from Meds: 6  Mobility walk without assistance do you drive?  no  Function not employed: date last employed .  Neuro/Psych weakness numbness tingling spasms  Prior Studies Any changes since last visit?  no CLINICAL DATA:  Neck pain for 3 weeks since falling in the garage. Pain radiates up to the posterior head and down the right  arm.  EXAM: MRI CERVICAL SPINE WITHOUT CONTRAST  TECHNIQUE: Multiplanar, multisequence MR imaging of the cervical spine was performed. No intravenous contrast was administered.  COMPARISON:  02/28/2016  FINDINGS: Alignment: Physiologic.  Vertebrae: No fracture, evidence of discitis, or bone lesion.  Cord: Mild T2 hyperintense signal at C4-5 and C5-6 likely reflecting mild myelomalacia.  Posterior Fossa, vertebral arteries, paraspinal tissues: Posterior fossa demonstrates no focal abnormality. Vertebral artery flow voids are maintained. Paraspinal soft tissues are unremarkable.  Disc levels:  Discs: Anterior cervical fusion from C4 through C6. Degenerative disc disease with disc height loss at C6-7.  C2-3: No significant disc bulge. Moderate left facet arthropathy. Mild left foraminal stenosis. No central canal stenosis.  C3-4: No significant disc bulge. Moderate left facet arthropathy and left uncovertebral degenerative changes with severe left foraminal stenosis. No central canal stenosis.  C4-5: Interbody fusion. Moderate bilateral foraminal stenosis. No central canal stenosis.  C5-6: Interbody fusion. No neural foraminal stenosis. No central canal stenosis.  C6-7: Broad-based disc bulge. Mild bilateral facet arthropathy. No neural foraminal stenosis. No central canal stenosis.  C7-T1: Broad-based disc bulge. No neural foraminal stenosis. No central canal stenosis.  IMPRESSION: 1.  No acute osseous injury of the cervical spine. 2. Anterior cervical fusion from C4 through C6. Moderate bilateral foraminal stenosis at C4-5. Mild T2 hyperintense signal at C4-5 and C5-6 likely reflecting mild myelomalacia. 3. At C3-4 there is moderate left facet arthropathy and left uncovertebral degenerative changes. Severe  left foraminal stenosis.   Electronically Signed   By: Kathreen Devoid   On: 09/04/2018 15:25  [facility] [place] Zach Smith - Mod Sev AC joint  arthritis , partial tear Right subscapularis- 2017          Physicians involved in your care Any changes since last visit?  no   Family History  Problem Relation Age of Onset  . Lung cancer Mother 55  . Cancer Mother        lung cancer  . Coronary artery disease Father 12  . Heart disease Father   . Hypothyroidism Brother   . Food Allergy Son   . Asthma Paternal Aunt   . Colon cancer Neg Hx   . Prostate cancer Neg Hx   . Allergic rhinitis Neg Hx   . Angioedema Neg Hx   . Eczema Neg Hx   . Immunodeficiency Neg Hx   . Urticaria Neg Hx    Social History   Socioeconomic History  . Marital status: Married    Spouse name: Not on file  . Number of children: Not on file  . Years of education: Not on file  . Highest education level: Not on file  Occupational History  . Occupation: Air cabin crew   Tobacco Use  . Smoking status: Never Smoker  . Smokeless tobacco: Never Used  Substance and Sexual Activity  . Alcohol use: Yes    Alcohol/week: 0.0 standard drinks    Comment: rare  . Drug use: No  . Sexual activity: Not on file  Other Topics Concern  . Not on file  Social History Narrative   Previously worked as Geophysicist/field seismologist   Currently working at [employer] clinic   Married 30 years   2 children ages 52, 71   Wife is psychiatrist - Dr. Margurite Auerbach   Moved from [place] in [place] Determinants of Health   Financial Resource Strain:   . Difficulty of Paying Living Expenses: Not on file  Food Insecurity:   . Worried About Charity fundraiser in the Last Year: Not on file  . Ran Out of Food in the Last Year: Not on file  Transportation Needs:   . Lack of Transportation (Medical): Not on file  . Lack of Transportation (Non-Medical): Not on file  Physical Activity:   . Days of Exercise per Week: Not on file  . Minutes of Exercise per Session: Not on file  Stress:   . Feeling of Stress : Not on file  Social Connections:   . Frequency of Communication  with Friends and Family: Not on file  . Frequency of Social Gatherings with Friends and Family: Not on file  . Attends Religious Services: Not on file  . Active Member of Clubs or Organizations: Not on file  . Attends Archivist Meetings: Not on file  . Marital Status: Not on file   Past Surgical History:  Procedure Laterality Date  . CORONARY STENT INTERVENTION N/A 06/23/2017   Procedure: CORONARY STENT INTERVENTION;  Surgeon: Leonie Man, MD;  Location: [place] [facility];  Service: Cardiovascular;  Laterality: N/A;  . INTRAVASCULAR PRESSURE WIRE/FFR STUDY N/A 06/23/2017   Procedure: INTRAVASCULAR PRESSURE WIRE/FFR STUDY;  Surgeon: Leonie Man, MD;  Location: [place] [facility];  Service: Cardiovascular;  Laterality: N/A;  . KNEE ARTHROSCOPY    . LEFT HEART CATH AND CORONARY ANGIOGRAPHY N/A 06/23/2017   Procedure: LEFT HEART CATH AND CORONARY ANGIOGRAPHY;  Surgeon:  Leonie Man, MD;  Location: [place] [facility];  Service: Cardiovascular;  Laterality: N/A;  . TENOTOMY ACHILLES TENDON     Past Medical History:  Diagnosis Date  . ADD (attention deficit disorder)   . Anemia   . Arthritis   . CAD (coronary artery disease)    2/19 PCI/DESx1 to Lcx  . Diabetes mellitus without complication ([place])   . Exercise-induced asthma   . Heart murmur   . Hyperlipidemia    family hx of high cholesterol  . Hypogonadism in male   . Low back pain   . Osteoarthritis    BP 120/71   Pulse 76   Temp 97.7 F (36.5 C)   Ht 5\' 10"  (1.778 m)   Wt 194 lb (88 kg)   SpO2 97%   BMI 27.84 kg/m   Opioid Risk Score:   Fall Risk Score:  `1  Depression screen PHQ 2/9  Depression screen PHQ 2/9 04/18/2018  Decreased Interest 0  Down, Depressed, Hopeless 0  PHQ - 2 Score 0     Review of Systems  Constitutional: Negative.   HENT: Negative.   Eyes: Negative.   Respiratory: Negative.   Cardiovascular: Negative.   Gastrointestinal: Negative.   Endocrine: Negative.    Genitourinary: Negative.   Musculoskeletal: Positive for arthralgias and myalgias.  Skin: Negative.   Allergic/Immunologic: Negative.   Neurological: Positive for weakness and numbness.  Hematological: Negative.   Psychiatric/Behavioral: Negative.   All other systems reviewed and are negative.      Objective:   Physical Exam Vitals and nursing note reviewed.  Constitutional:      Appearance: Normal appearance.  HENT:     Head: Normocephalic and atraumatic.  Eyes:     Extraocular Movements: Extraocular movements intact.     Conjunctiva/sclera: Conjunctivae normal.     Pupils: Pupils are equal, round, and reactive to light.  Neck:     Comments: There is tenderness of the right trapezius upper portion to palpation. Patient has reduced cervical spine range of motion approximately 50% range flexion extension lateral bending and rotation. Cardiovascular:     Rate and Rhythm: Normal rate and regular rhythm.     Heart sounds: Normal heart sounds. No murmur.  Pulmonary:     Effort: Pulmonary effort is normal. No respiratory distress.     Breath sounds: Normal breath sounds. No stridor. No wheezing.  Chest:     Chest wall: No tenderness.  Abdominal:     General: Abdomen is flat. Bowel sounds are normal. There is distension.     Palpations: Abdomen is soft.     Tenderness: There is no abdominal tenderness.  Musculoskeletal:     Right shoulder: Tenderness present. No deformity or effusion. Decreased range of motion. Decreased strength.     Left shoulder: Normal.     Cervical back: Tenderness present.  Lymphadenopathy:     Cervical: No cervical adenopathy.  Skin:    General: Skin is warm and dry.  Neurological:     General: No focal deficit present.     Mental Status: He is alert and oriented to person, place, and time.     Cranial Nerves: No dysarthria.     Motor: Weakness and atrophy present.     Coordination: Coordination is intact.     Gait: Gait is intact.     Deep  Tendon Reflexes:     Reflex Scores:      Tricep reflexes are 0 on the right side and 1+  on the left side.      Bicep reflexes are 0 on the right side and 1+ on the left side.      Brachioradialis reflexes are 0 on the right side and 1+ on the left side.      Patellar reflexes are 1+ on the right side and 1+ on the left side.      Achilles reflexes are 1+ on the right side and 1+ on the left side.    Comments: Strength is 5/5 in the left deltoid bicep tricep grip as well as bilateral hip flexor and extensor ankle dorsiflexors. 3 - right deltoid 4 at the right bicep and tricep and finger flexors as well as finger abductors  Sensation reduced in the right C7 and right C8 dermatomal distribution  Psychiatric:        Mood and Affect: Mood normal.        Behavior: Behavior normal.        Thought Content: Thought content normal.        Judgment: Judgment normal.   Cervical spine negative Spurling's maneuver Right shoulder positive impingement at 90 degrees there is also anterior impingement around 120 degrees of forward flexion.  In addition there is limited external rotation of the shoulder accompanied by pain at approximately 10 degrees of external rotation.  Crossed adduction with mild tenderness in the [facility] joint  There is no evidence of atrophy in the periscapular musculature on the right side. There is atrophy of the first dorsal interosseous space of the right hand as well as the abductor digit he minimi this of the right hand.          Assessment & Plan:  #1.  Chronic right shoulder and neck pain. This appears to be multifactorial.  Reviewed MRI of the cervical spine.  While there is moderate multifactorial stenosis C4-5 and C5-6 as well as some mild myelomalacia at that level, this appears symmetric and the patient has no symptoms on the left side.  On examination there is tenderness over the right upper trapezius and physical therapy will address this will recommend addition of dry  needling for myofascial pain  C7 and C8 denervation appear chronic and not related to his current symptomatology.  The patient does have reduced range of motion in the right shoulder particularly with external rotation suggesting adhesive capsulitis of the right shoulder.  In addition he may have impingement syndrome as well. Will do glenohumeral injection palpation guided today and if no significant benefit may need to do under ultrasound guidance.  We also discussed the need for additional PT to work on scapular stabilization.  Shoulder injection Right glenohumeral    Indication:RIght Shoulder pain not relieved by medication management and other conservative care.  Informed consent was obtained after describing risks and benefits of the procedure with the patient, this includes bleeding, bruising, infection and medication side effects. The patient wishes to proceed and has given written consent. Patient was placed in a seated position. The Right shoulder was marked and prepped with betadine in the subacromial area. A 25-gauge 1-1/2 inch needle was inserted into the subacromial area. After negative draw back for blood, a solution containing 1 mL of 6 mg per ML betamethasone and 4 mL of 1% lidocaine was injected. A band aid was applied. The patient tolerated the procedure well. Post procedure instructions were given.  Right shoulder contracture this may be adhesive capsulitis related we will check x-rays of the shoulder to see if there is  any significant glenohumeral arthritis there was an ultrasound from 2017 demonstrating some acromioclavicular joint DJD rated as moderate to severe  Return to clinic in 1 month

## 2019-04-24 NOTE — Therapy (Signed)
[facility] [address]  [place], [place], [address] Phone: [phone]   Fax:  [phone]  Physical Therapy Treatment  Patient Details  Name: Reginald Rios MRN: [id number] Date of Birth: Jan 07, 1956 Referring Provider (PT): Crosby Oyster [place], [place]   Encounter Date: 04/24/2019  PT End of Session - 04/24/19 1112    Visit Number  5    Number of Visits  9    Date for PT Re-Evaluation  05/08/19    Authorization Type  [facility]    PT Start Time  1106    PT Stop Time  1209    PT Time Calculation (min)  63 min    Activity Tolerance  Patient tolerated treatment well;Patient limited by pain    Behavior During Therapy  [facility] for tasks assessed/performed       Past Medical History:  Diagnosis Date  . ADD (attention deficit disorder)   . Anemia   . Arthritis   . CAD (coronary artery disease)    2/19 PCI/DESx1 to Lcx  . Diabetes mellitus without complication ([place])   . Exercise-induced asthma   . Heart murmur   . Hyperlipidemia    family hx of high cholesterol  . Hypogonadism in male   . Low back pain   . Osteoarthritis     Past Surgical History:  Procedure Laterality Date  . CORONARY STENT INTERVENTION N/A 06/23/2017   Procedure: CORONARY STENT INTERVENTION;  Surgeon: Leonie Man, MD;  Location: [place] [facility];  Service: Cardiovascular;  Laterality: N/A;  . INTRAVASCULAR PRESSURE WIRE/FFR STUDY N/A 06/23/2017   Procedure: INTRAVASCULAR PRESSURE WIRE/FFR STUDY;  Surgeon: Leonie Man, MD;  Location: [place] [facility];  Service: Cardiovascular;  Laterality: N/A;  . KNEE ARTHROSCOPY    . LEFT HEART CATH AND CORONARY ANGIOGRAPHY N/A 06/23/2017   Procedure: LEFT HEART CATH AND CORONARY ANGIOGRAPHY;  Surgeon: Leonie Man, MD;  Location: [place] [facility];  Service: Cardiovascular;  Laterality: N/A;  . TENOTOMY ACHILLES TENDON      There were no vitals filed for this visit.  Subjective Assessment  - 04/24/19 1117    Subjective  Had to "putty the ceiling" overhead for 10 min on Friday of last week.  Had a lot of pain on Saturday.    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 Chronic denervation in right triceps may be related to underlying right C7 radiculopathy.    Patient Stated Goals  get rid of back    Currently in Pain?  Yes    Pain Score  6     Pain Location  Shoulder    Pain Orientation  Right;Lateral    Pain Descriptors / Indicators  Sharp    Pain Type  Chronic pain    Pain Radiating Towards  numbness into R pinky and R lateral hand    Pain Onset  More than a month ago    Pain Frequency  Constant    Aggravating Factors   puttying ceiling for prolonged overhead work with B UE    Multiple Pain Sites  No                       [facility] Adult PT Treatment/Exercise - 04/24/19  0001      Neck Exercises: Standing   Neck Retraction  15 reps;5 secs    Neck Retraction Limitations  standing leaning on 1/2 foam on wall + ceri retraction into ball + scap. retraction       Neck Exercises: Seated   Shoulder Rolls  15 reps;Backwards    Shoulder Rolls Limitations  2 sets       Shoulder Exercises: Supine   External Rotation  AAROM;Right;10 reps    External Rotation Limitations  elbow under elbow     Flexion  Right;10 reps;AAROM    Flexion Limitations  with wand to tolerance    ABduction  Right;10 reps;AAROM    ABduction Limitations  scaption; wand       Shoulder Exercises: Standing   Horizontal ABduction  Both;Strengthening;Theraband   terminated after increased R shoulder pain    Theraband Level (Shoulder Horizontal ABduction)  Level 1 (Yellow)    Horizontal ABduction Limitations  leaning on 1/2 foam on wall       Shoulder Exercises: Pulleys   Flexion  3 minutes     Flexion Limitations  to tolerance    Scaption  3 minutes    Scaption Limitations  to tolerance      Modalities   Modalities  Electrical Stimulation      Moist Heat Therapy   Number Minutes Moist Heat  10 Minutes    Moist Heat Location  Shoulder   R shoulder, R UT, R pec     Electrical Stimulation   Electrical Stimulation Location  R shoulder complex     Electrical Stimulation Action  IFC    Electrical Stimulation Parameters  80-150Hz , intensity to pt. tolerance 10'    Electrical Stimulation Goals  Pain;Tone      Manual Therapy   Manual Therapy  Passive ROM;Soft tissue mobilization;Myofascial release    Manual therapy comments  supine and seated     Soft tissue mobilization  STM/DTM to R UT, LS, R cervical paraspinals, R pec     Myofascial Release  TPR to R UT, LS     Passive ROM  R manual pec stretch 2 x 30 sec; R shoulder shoulder PROM in all planes      Prosthetics   Prosthetic Care Comments          Neck Exercises: Stretches   Upper Trapezius Stretch  Right;Left;2 reps;30 seconds    Upper Trapezius Stretch Limitations  therapist overpressure to encourage scap. depression     Levator Stretch  Right;Left;2 reps;30 seconds    Levator Stretch Limitations  therapist overpressure to encouraged scap. retraction                PT Short Term Goals - 04/17/19 1148      PT SHORT TERM GOAL #1   Title  Patient to be independent with initial HEP.    Time  2    Period  Weeks    Status  Achieved    Target Date  04/24/19        PT Long Term Goals - 04/19/19 1132      PT LONG TERM GOAL #1   Title  Patient to be independent with advanced HEP.    Time  4    Period  Weeks    Status  On-going      PT LONG TERM GOAL #2   Title  Patient to demonstrate [facility] and pain-free R shoulder AROM/PROM.    Time  4  Period  Weeks    Status  On-going      PT LONG TERM GOAL #3   Title  Patient to demonstrate >=4+/5 strength in R UE and demonstrate atleast 40lbs in R grip  strength.    Time  4    Period  Weeks    Status  On-going      PT LONG TERM GOAL #4   Title  Patient to report 70% improvement in pain levels with overhead reaching.    Time  4    Period  Weeks    Status  On-going      PT LONG TERM GOAL #5   Title  Patient to report <3/10 pain in R UE with shaving.    Time  4    Period  Weeks    Status  On-going            Plan - 04/24/19 1112    Clinical Impression Statement  Shanon Brow admitting that he had to "sheet rock putty my ceiling" noting ~ 10 min of overhead work with B upper extremities on Friday.  Notes increased R arm pain since Friday when he did this home improvement project.  MT today addressing increased R UT, LS, pec, shoulder complex tightness/tenderness with good relief noted.  Manual TPR for R UT with palpable TP with good response today.  Postural therex focused on scapular/cervical muscular activation with cueing throughout therex required to avoid painful arc of motion.  Pt. still requiring frequent cueing to avoid activities at home and in therapy that increased his pain.  Ended session with trial of E-stim/moist heat to R UT, R shoulder complex with pt. noting good reduction in pain and relaxation of musculature following these modalities.  Pt. reports he will see MD for "physical medicine" lateral today.  Will continue to benefit from further skilled physical therapy to improve R shoulder ROM, activity tolerance, and functional strength.    Comorbidities  OA, LBP, HLD, heart murmur, asthma, DM, CAD, anemia, ADD, achilles tenotomy, L heart cath & angio 06/2017, coronary stent 06/2017, C4-6 fusion    Rehab Potential  Good    PT Treatment/Interventions  ADLs/Self Care Home Management;Cryotherapy;Electrical Stimulation;Moist Heat;Therapeutic exercise;Therapeutic activities;Functional mobility training;Ultrasound;Neuromuscular re-education;Patient/family education;Manual techniques;Vasopneumatic Device;Taping;Energy conservation;Dry  needling;Passive range of motion    PT Next Visit Plan  progress postural and R shoulder strengthening activities pt pt. tolerance; progress ROM activities; modalities for pain relief prn    Consulted and Agree with Plan of Care  Patient       Patient will benefit from skilled therapeutic intervention in order to improve the following deficits and impairments:  Hypomobility, Impaired sensation, Decreased activity tolerance, Decreased strength, Increased fascial restricitons, Impaired UE functional use, Pain, Improper body mechanics, Decreased range of motion, Postural dysfunction, Impaired flexibility  Visit Diagnosis: Pain in right arm  Stiffness of right shoulder, not elsewhere classified  Muscle weakness (generalized)  Abnormal posture     Problem List Patient Active Problem List   Diagnosis Date Noted  . Mild persistent asthma/cough variant asthma 04/13/2019  . Allergic rhinitis with a possible nonallergic component 04/13/2019  . Acid reflux 04/13/2019  . Cough 04/13/2019  . Hoarseness 04/13/2019  . Cervical radicular pain 01/10/2019  . Ulnar neuropathy at elbow of right upper extremity 01/03/2019  . Acute bilateral low back pain without sciatica 01/03/2019  . Onychomycosis 05/25/2018  . Palpitations 07/06/2017  . CAD (coronary artery disease) 07/05/2017  . Family history of early CAD 07/05/2017  . Abnormal  findings on diagnostic imaging of cardiovascular system 06/23/2017  . Low vitamin D level 04/22/2017  . Pain in right ankle and joints of right foot 10/05/2016  . Hemarthrosis of right elbow 02/03/2016  . Small thenar eminence 02/03/2016  . Cervical disc disorder with radiculopathy of cervical region 10/07/2015  . Incomplete rotator cuff tear 09/10/2015  . Nonischemic cardiomyopathy ([place]) 05/15/2015  . Fatigue 05/15/2015  . Dyspnea on exertion 05/15/2015  . Decreased cardiac ejection fraction 04/02/2015  . Strain of latissimus dorsi muscle 01/21/2015  . Shoulder  bursitis 10/12/2014  . Stenosis of lateral recess of lumbar spine 10/12/2014  . BPPV (benign paroxysmal positional vertigo) 08/14/2014  . Hyperlipidemia 01/19/2014  . Anemia, unspecified 01/19/2014  . ADD (attention deficit disorder) 01/19/2014  . Type 2 diabetes mellitus ([place]) 12/22/2013  . Hypogonadism in male 12/22/2013  . Arthritis of hand, left 12/22/2013    Bess Harvest, PTA 04/24/19 12:22 PM    [facility] [address]  [place], [place], [address] Phone: [phone]   Fax:  [phone]  Name: Reginald Rios MRN: [id number] Date of Birth: 1955/10/15

## 2019-04-24 NOTE — Patient Instructions (Signed)
Adhesive Capsulitis  Adhesive capsulitis, also called frozen shoulder, causes the shoulder to become stiff and painful to move. This condition happens when there is inflammation of the tendons and ligaments that surround the shoulder joint (shoulder capsule). What are the causes? This condition may be caused by:  An injury to your shoulder joint.  Straining your shoulder.  Not moving your shoulder for a period of time. This can happen if your arm was injured or in a sling.  Long-standing conditions, such as: ? Diabetes. ? Thyroid problems. ? Heart disease. ? Stroke. ? Rheumatoid arthritis. ? Lung disease. In some cases, the cause is not known. What increases the risk? You are more likely to develop this condition if you are:  A woman.  Older than 65 years of age. What are the signs or symptoms? Symptoms of this condition include:  Pain in your shoulder when you move your arm. There may also be pain when parts of your shoulder are touched. The pain may be worse at night or when you are resting.  A sore or aching shoulder.  The inability to move your shoulder normally.  Muscle spasms. How is this diagnosed? This condition is diagnosed with a physical exam and imaging tests, such as an X-ray or MRI. How is this treated? This condition may be treated with:  Treatment of the underlying cause or condition.  Medicine. Medicine may be given to relieve pain, inflammation, or muscle spasms.  Steroid injections into the shoulder joint.  Physical therapy. This involves performing exercises to get the shoulder moving again.  Acupuncture. This is a type of treatment that involves stimulating specific points on your body by inserting thin needles through your skin.  Shoulder manipulation. This is a procedure to move the shoulder into another position. It is done after you are given a medicine to make you fall asleep (general anesthetic). The joint may also be injected with salt  water at high pressure to break down scarring.  Surgery. This may be done in severe cases when other treatments have failed. Although most people recover completely from adhesive capsulitis, some may not regain full shoulder movement. Follow these instructions at home: Managing pain, stiffness, and swelling      If directed, put ice on the injured area: ? Put ice in a plastic bag. ? Place a towel between your skin and the bag. ? Leave the ice on for 20 minutes, 2-3 times per day.  If directed, apply heat to the affected area before you exercise. Use the heat source that your health care provider recommends, such as a moist heat pack or a heating pad. ? Place a towel between your skin and the heat source. ? Leave the heat on for 20-30 minutes. ? Remove the heat if your skin turns bright red. This is especially important if you are unable to feel pain, heat, or cold. You may have a greater risk of getting burned. General instructions  Take over-the-counter and prescription medicines only as told by your health care provider.  If you are being treated with physical therapy, follow instructions from your physical therapist.  Avoid exercises that put a lot of demand on your shoulder, such as throwing. These exercises can make pain worse.  Keep all follow-up visits as told by your health care provider. This is important. Contact a health care provider if:  You develop new symptoms.  Your symptoms get worse. Summary  Adhesive capsulitis, also called frozen shoulder, causes the shoulder to become   stiff and painful to move.  You are more likely to have this condition if you are a woman and over age 65.  It is treated with physical therapy, medicines, and sometimes surgery. This information is not intended to replace advice given to you by your health care provider. Make sure you discuss any questions you have with your health care provider. Document Released: 02/15/2009 Document  Revised: 09/24/2017 Document Reviewed: 09/24/2017 Elsevier Patient Education  2020 [employer]  

## 2019-04-25 ENCOUNTER — Ambulatory Visit (HOSPITAL_BASED_OUTPATIENT_CLINIC_OR_DEPARTMENT_OTHER)
Admission: RE | Admit: 2019-04-25 | Discharge: 2019-04-25 | Disposition: A | Discharge status: Home / Self Care | Payer: 59 | Source: Ambulatory Visit | Attending: Physical Medicine & Rehabilitation | Admitting: Physical Medicine & Rehabilitation

## 2019-04-25 ENCOUNTER — Telehealth: Payer: Self-pay

## 2019-04-25 ENCOUNTER — Other Ambulatory Visit: Payer: Self-pay | Admitting: Family Medicine

## 2019-04-25 DIAGNOSIS — M24511 Contracture, right shoulder: Secondary | ICD-10-CM | POA: Insufficient documentation

## 2019-04-25 DIAGNOSIS — M19011 Primary osteoarthritis, right shoulder: Secondary | ICD-10-CM | POA: Diagnosis not present

## 2019-04-25 MED ORDER — METHYLPHENIDATE HCL 20 MG PO TABS: 20.0000 mg | ORAL_TABLET | Freq: Every day | ORAL | 0 refills | Status: DC

## 2019-04-25 MED ORDER — METHYLPHENIDATE HCL 20 MG PO TABS: 20.0000 mg | ORAL_TABLET | Freq: Two times a day (BID) | ORAL | 0 refills | Status: DC

## 2019-04-25 MED ORDER — OMEPRAZOLE 20 MG PO CPDR: DELAYED_RELEASE_CAPSULE | ORAL | 5 refills | Status: DC

## 2019-04-25 MED FILL — METHYLPHENIDATE HCL 20 MG T: 20 | 30 days supply | Qty: 30 | Fill #0

## 2019-04-25 MED FILL — OMEPRAZOLE 20 MG CAP: 20 | 30 days supply | Qty: 30 | Fill #0

## 2019-04-25 NOTE — Addendum Note (Signed)
Addended by: Orpah Greek D on: 04/25/2019 11:56 AM   Modules accepted: Orders

## 2019-04-25 NOTE — Telephone Encounter (Signed)
Requesting:   Ritalin Contract:   [place] signed on 01/10/2019 UDS:    UDS done on 01/10/2019 Last Visit:    04/11/2019 Next Visit:     07/11/2019 Last Refill:    01/05/2019   #30 no refills  Please Advise

## 2019-04-25 NOTE — Telephone Encounter (Signed)
Patient informed. He is using a spacer device with his Flovent. Omeprazole 20 mg called in to [facility]

## 2019-04-25 NOTE — Telephone Encounter (Signed)
Patient called stating that he is loosing his voice, coughing and having an increase in reflux x 1 week. He stopped his famotidine x 3 days because he read on the insert that it could cause voice loss. He is now taking Tums and also taking Flovent 110 mcg and Albuterol. I asked him if he was rinsing his mouth out after using the Flovent, he said he was drinking water after. Advised him to rinse, gargle and spit out after use. Please advise.

## 2019-04-25 NOTE — Telephone Encounter (Signed)
Start omeprazole 20 mg daily, 30 min prior to breakfast.  Make sure the patient is using a spacer device. Thanks

## 2019-04-26 ENCOUNTER — Ambulatory Visit: Payer: 59

## 2019-04-26 ENCOUNTER — Other Ambulatory Visit: Payer: Self-pay

## 2019-04-26 DIAGNOSIS — M24511 Contracture, right shoulder: Secondary | ICD-10-CM | POA: Diagnosis not present

## 2019-04-26 DIAGNOSIS — M7501 Adhesive capsulitis of right shoulder: Secondary | ICD-10-CM | POA: Diagnosis not present

## 2019-04-26 DIAGNOSIS — M79601 Pain in right arm: Secondary | ICD-10-CM

## 2019-04-26 DIAGNOSIS — G5621 Lesion of ulnar nerve, right upper limb: Secondary | ICD-10-CM | POA: Diagnosis not present

## 2019-04-26 DIAGNOSIS — M6281 Muscle weakness (generalized): Secondary | ICD-10-CM

## 2019-04-26 DIAGNOSIS — M25611 Stiffness of right shoulder, not elsewhere classified: Secondary | ICD-10-CM

## 2019-04-26 DIAGNOSIS — M5412 Radiculopathy, cervical region: Secondary | ICD-10-CM | POA: Diagnosis not present

## 2019-04-26 DIAGNOSIS — R293 Abnormal posture: Secondary | ICD-10-CM

## 2019-04-26 MED FILL — TESTOSTERONE 50 MG/5GM (1%): 50 MG/5GM | 30 days supply | Qty: 300 | Fill #1

## 2019-04-26 NOTE — Patient Instructions (Signed)

## 2019-04-26 NOTE — Therapy (Signed)
[facility] [address]  [place], [place], [address] Phone: [phone]   Fax:  [phone]  Physical Therapy Treatment  Patient Details  Name: Reginald Rios MRN: [id number] Date of Birth: [birth year] Referring Provider (PT): Dr. Letta [name]   Encounter Date: 04/26/2019  PT End of Session - 04/26/19 1115    Visit Number  6    Number of Visits  9    Date for PT Re-Evaluation  05/08/19    Authorization Type  [facility]    PT Start Time  1102    PT Stop Time  1203    PT Time Calculation (min)  61 min    Activity Tolerance  Patient tolerated treatment well;Patient limited by pain    Behavior During Therapy  [facility] for tasks assessed/performed       Past Medical History:  Diagnosis Date  . ADD (attention deficit disorder)   . Anemia   . Arthritis   . CAD (coronary artery disease)    2/19 PCI/DESx1 to Lcx  . Diabetes mellitus without complication ([place])   . Exercise-induced asthma   . Heart murmur   . Hyperlipidemia    family hx of high cholesterol  . Hypogonadism in male   . Low back pain   . Osteoarthritis     Past Surgical History:  Procedure Laterality Date  . CORONARY STENT INTERVENTION N/A 06/23/2017   Procedure: CORONARY STENT INTERVENTION;  Surgeon: Leonie Man, MD;  Location: [facility];  Service: Cardiovascular;  Laterality: N/A;  . INTRAVASCULAR PRESSURE WIRE/FFR STUDY N/A 06/23/2017   Procedure: INTRAVASCULAR PRESSURE WIRE/FFR STUDY;  Surgeon: Leonie Man, MD;  Location: [facility];  Service: Cardiovascular;  Laterality: N/A;  . KNEE ARTHROSCOPY    . LEFT HEART CATH AND CORONARY ANGIOGRAPHY N/A 06/23/2017   Procedure: LEFT HEART CATH AND CORONARY ANGIOGRAPHY;  Surgeon: Leonie Man, MD;  Location: [facility];  Service: Cardiovascular;  Laterality: N/A;  . TENOTOMY ACHILLES TENDON      There were no vitals filed for this visit.  Subjective Assessment - 04/26/19  1123    Subjective  Pt. noting he got injection in shoulder from MD a few days ago and MD recommending DN in upper shoulder along with MD telling pt. he has Priscille Kluver capsullitis of R shoulder.  Pt. noting MD wanting therapy to address scapular stabilization.    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 Chronic denervation in right triceps may be related to underlying right C7 radiculopathy.    Patient Stated Goals  get rid of back    Currently in Pain?  Yes    Pain Score  6     Pain Location  Shoulder    Pain Orientation  Right;Lateral    Pain Descriptors / Indicators  Sharp    Pain Type  Chronic pain    Pain Radiating Towards  numbness into R pinky and R lateral hand    Pain Onset  More than a month ago    Pain Frequency  Constant    Multiple Pain Sites  No         [facility] PT Assessment - 04/26/19 0001      Assessment   Medical Diagnosis  R  arm pain    Referring Provider (PT)  Dr. Albertha Ghee Adult PT Treatment/Exercise - 04/26/19 0001      Self-Care   Self-Care  Other Self-Care Comments    Other Self-Care Comments   Education on frozen shoulder and education in regards to nature of impingement syndrome; rationale for proper scapulohumeral rhythm for improved mechanics with shoulder; explanation of wand AAROM activities and their use for improved ROM at home with wand in supine position      Neck Exercises: Standing   Other Standing Exercises  Standing B scapular retraction leaning on doorseal 5" x 15 rpes       Shoulder Exercises: Supine   External Rotation  AAROM;Right;15 reps    External Rotation Limitations  pillow under elbow     Flexion  Right;15 reps;AAROM    Flexion Limitations  with wand to tolerance    ABduction  Right;15 reps;AAROM     ABduction Limitations  scaption; wand       Shoulder Exercises: Standing   Extension  Both;10 reps;Strengthening;Theraband    Extension Limitations  tactile cueing for improved scap. retraction/depression     Row  Both;10 reps;Strengthening;Theraband    Theraband Level (Shoulder Row)  Level 1 (Yellow)    Row Limitations  Tactile cueing for improved scapular retraction/depression       Modalities   Modalities  Vasopneumatic      Moist Heat Therapy   Number Minutes Moist Heat  10 Minutes    Moist Heat Location  Shoulder   R UT     Vasopneumatic   Number Minutes Vasopneumatic   10 minutes    Vasopnuematic Location   Shoulder    Vasopneumatic Pressure  Low    Vasopneumatic Temperature   coldest      Manual Therapy   Manual Therapy  Soft tissue mobilization;Myofascial release    Manual therapy comments  supine and seated     Soft tissue mobilization  STM/DTM to R UT, LS     Myofascial Release  TPR to R UT, LS       Neck Exercises: Stretches   Upper Trapezius Stretch  Right;Left;2 reps;30 seconds    Upper Trapezius Stretch Limitations  therapist overpressure to encourage scap. depression     Levator Stretch  Right;Left;2 reps;30 seconds    Levator Stretch Limitations  therapist overpressure to encouraged scap. retraction                PT Short Term Goals - 04/17/19 1148      PT SHORT TERM GOAL #1   Title  Patient to be independent with initial HEP.    Time  2    Period  Weeks    Status  Achieved    Target Date  04/24/19        PT Long Term Goals - 04/19/19 1132      PT LONG TERM GOAL #1   Title  Patient to be independent with advanced HEP.    Time  4    Period  Weeks    Status  On-going      PT LONG TERM GOAL #2   Title  Patient to demonstrate [facility] and pain-free R shoulder AROM/PROM.    Time  4    Period  Weeks    Status  On-going      PT LONG TERM GOAL #3   Title  Patient  to demonstrate >=4+/5 strength in R UE and demonstrate atleast 40lbs in R  grip strength.    Time  4    Period  Weeks    Status  On-going      PT LONG TERM GOAL #4   Title  Patient to report 70% improvement in pain levels with overhead reaching.    Time  4    Period  Weeks    Status  On-going      PT LONG TERM GOAL #5   Title  Patient to report <3/10 pain in R UE with shaving.    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  Pt. issued general DN educational handout (see pt. instruction).  Session focused on aggressive STM/DTM to R UT (with relief noted in R arm pain following), scapular stabilization activities, and cervical/shoulder flexibility activities.  Encouraged pt. to continue AAROM wand activities in supine at home for improved ROM with somewhat more aggressive approach and also encouraged him to avoid excessive overhead activities at home as he still demonstrates poor scapulohumeral rhythm with increased impingement positioning ~ 90 dg elevation (excessive scapular hiking).  Trialed ice/compression to shoulder and moist heat to proximal R UT with good relief noted.  Pt. with initial pain rating at R lateral shoulder of 6/10 to start session and ended session pain free.    Comorbidities  OA, LBP, HLD, heart murmur, asthma, DM, CAD, anemia, ADD, achilles tenotomy, L heart cath & angio 06/2017, coronary stent 06/2017, C4-6 fusion    Rehab Potential  Good    PT Treatment/Interventions  ADLs/Self Care Home Management;Cryotherapy;Electrical Stimulation;Moist Heat;Therapeutic exercise;Therapeutic activities;Functional mobility training;Ultrasound;Neuromuscular re-education;Patient/family education;Manual techniques;Vasopneumatic Device;Taping;Energy conservation;Dry needling;Passive range of motion    PT Next Visit Plan   progress postural and R shoulder strengthening activities pt pt. tolerance; progress ROM activities; modalities for pain relief prn    Consulted and Agree with Plan of Care  Patient       Patient will benefit from skilled therapeutic intervention in order to improve the following deficits and impairments:  Hypomobility, Impaired sensation, Decreased activity tolerance, Decreased strength, Increased fascial restricitons, Impaired UE functional use, Pain, Improper body mechanics, Decreased range of motion, Postural dysfunction, Impaired flexibility  Visit Diagnosis: Pain in right arm  Stiffness of right shoulder, not elsewhere classified  Muscle weakness (generalized)  Abnormal posture     Problem List Patient Active Problem List   Diagnosis Date Noted  . Mild persistent asthma/cough variant asthma 04/13/2019  . Allergic rhinitis with a possible nonallergic component 04/13/2019  . Acid reflux 04/13/2019  . Cough 04/13/2019  . Hoarseness 04/13/2019  . Cervical radicular pain 01/10/2019  . Ulnar neuropathy at elbow of right upper extremity 01/03/2019  . Acute bilateral low back pain without sciatica 01/03/2019  . Onychomycosis 05/25/2018  . Palpitations 07/06/2017  . CAD (coronary artery disease) 07/05/2017  . Family history of early CAD 07/05/2017  . Abnormal findings on diagnostic imaging of cardiovascular system 06/23/2017  . Low vitamin D level 04/22/2017  . Pain in right ankle and joints of right foot 10/05/2016  . Hemarthrosis of right elbow 02/03/2016  . Small thenar eminence 02/03/2016  .  Cervical disc disorder with radiculopathy of cervical region 10/07/2015  . Incomplete rotator cuff tear 09/10/2015  . Nonischemic cardiomyopathy ([place]) 05/15/2015  . Fatigue 05/15/2015  . Dyspnea on exertion 05/15/2015  . Decreased cardiac ejection fraction 04/02/2015  . Strain of latissimus dorsi muscle 01/21/2015  . Shoulder bursitis 10/12/2014  . Stenosis of lateral recess of  lumbar spine 10/12/2014  . BPPV (benign paroxysmal positional vertigo) 08/14/2014  . Hyperlipidemia 01/19/2014  . Anemia, unspecified 01/19/2014  . ADD (attention deficit disorder) 01/19/2014  . Type 2 diabetes mellitus ([place]) 12/22/2013  . Hypogonadism in male 12/22/2013  . Arthritis of hand, left 12/22/2013    Bess Harvest, PTA 04/26/19 1:26 PM   [facility] [address]  [place], [place], [address] Phone: [phone]   Fax:  [phone]  Name: SAQIB SUNDE MRN: [id number] Date of Birth: 08-01-1955

## 2019-04-29 DIAGNOSIS — M542 Cervicalgia: Secondary | ICD-10-CM | POA: Diagnosis not present

## 2019-05-01 ENCOUNTER — Other Ambulatory Visit: Payer: Self-pay

## 2019-05-01 ENCOUNTER — Encounter: Payer: Self-pay | Admitting: Physical Therapy

## 2019-05-01 ENCOUNTER — Ambulatory Visit: Payer: 59 | Admitting: Physical Therapy

## 2019-05-01 DIAGNOSIS — G5621 Lesion of ulnar nerve, right upper limb: Secondary | ICD-10-CM | POA: Diagnosis not present

## 2019-05-01 DIAGNOSIS — M5412 Radiculopathy, cervical region: Secondary | ICD-10-CM | POA: Diagnosis not present

## 2019-05-01 DIAGNOSIS — M7501 Adhesive capsulitis of right shoulder: Secondary | ICD-10-CM | POA: Diagnosis not present

## 2019-05-01 DIAGNOSIS — R293 Abnormal posture: Secondary | ICD-10-CM

## 2019-05-01 DIAGNOSIS — M24511 Contracture, right shoulder: Secondary | ICD-10-CM | POA: Diagnosis not present

## 2019-05-01 DIAGNOSIS — M79601 Pain in right arm: Secondary | ICD-10-CM

## 2019-05-01 DIAGNOSIS — M25611 Stiffness of right shoulder, not elsewhere classified: Secondary | ICD-10-CM

## 2019-05-01 DIAGNOSIS — M6281 Muscle weakness (generalized): Secondary | ICD-10-CM

## 2019-05-01 NOTE — Therapy (Signed)
[facility] [address]  [place], [place], [address] Phone: [phone]   Fax:  [phone]  Physical Therapy Treatment  Patient Details  Name: Reginald Rios MRN: [id number] Date of Birth: 09-04-1955 Referring Provider (PT): Dr. Letta [name]   Encounter Date: 05/01/2019  PT End of Session - 05/01/19 1150    Visit Number  7    Number of Visits  9    Date for PT Re-Evaluation  05/08/19    Authorization Type  [facility]    PT Start Time  1100    PT Stop Time  1157    PT Time Calculation (min)  57 min    Activity Tolerance  Patient tolerated treatment well;Patient limited by pain    Behavior During Therapy  [facility] for tasks assessed/performed       Past Medical History:  Diagnosis Date  . ADD (attention deficit disorder)   . Anemia   . Arthritis   . CAD (coronary artery disease)    2/19 PCI/DESx1 to Lcx  . Diabetes mellitus without complication ([place])   . Exercise-induced asthma   . Heart murmur   . Hyperlipidemia    family hx of high cholesterol  . Hypogonadism in male   . Low back pain   . Osteoarthritis     Past Surgical History:  Procedure Laterality Date  . CORONARY STENT INTERVENTION N/A 06/23/2017   Procedure: CORONARY STENT INTERVENTION;  Surgeon: Leonie Man, MD;  Location: [place] [facility];  Service: Cardiovascular;  Laterality: N/A;  . INTRAVASCULAR PRESSURE WIRE/FFR STUDY N/A 06/23/2017   Procedure: INTRAVASCULAR PRESSURE WIRE/FFR STUDY;  Surgeon: Leonie Man, MD;  Location: [place] [facility];  Service: Cardiovascular;  Laterality: N/A;  . KNEE ARTHROSCOPY    . LEFT HEART CATH AND CORONARY ANGIOGRAPHY N/A 06/23/2017   Procedure: LEFT HEART CATH AND CORONARY ANGIOGRAPHY;  Surgeon: Leonie Man, MD;  Location: [place] [facility];  Service: Cardiovascular;  Laterality: N/A;  . TENOTOMY ACHILLES TENDON      There were no vitals filed for this visit.  Subjective Assessment - 05/01/19  1100    Subjective  Wanted to sit all day yesterday- scared to move. Did his exercises this AM. BG was 169 this AM.    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 Chronic denervation in right triceps may be related to underlying right C7 radiculopathy.    Patient Stated Goals  get rid of back    Currently in Pain?  Yes    Pain Score  5     Pain Location  Shoulder    Pain Orientation  Right    Pain Descriptors / Indicators  Sharp    Pain Type  Chronic pain                       [facility] Adult PT Treatment/Exercise - 05/01/19 0001      Shoulder Exercises: Seated   External Rotation  AAROM;Right;10 reps    External Rotation Limitations  with wand to tolerance    Internal Rotation  AAROM;Right;10 reps    Internal Rotation Limitations  with wand to tolerance      Shoulder Exercises: Sidelying   External Rotation  AROM;Right;10 reps  External Rotation Weight (lbs)  0, 1    External Rotation Limitations  10x 0#, 10x 1#; able to maintain scap retraction    ABduction  Right;10 reps    ABduction Limitations  thumb up; to tolerance      Shoulder Exercises: Pulleys   Flexion  3 minutes    Flexion Limitations  to tolerance    Scaption  3 minutes    Scaption Limitations  to tolerance      Moist Heat Therapy   Number Minutes Moist Heat  15 Minutes    Moist Heat Location  Shoulder   R      Vasopneumatic   Number Minutes Vasopneumatic   15 minutes    Vasopnuematic Location   Shoulder   R   Vasopneumatic Pressure  Low    Vasopneumatic Temperature   coldest      Manual Therapy   Manual Therapy  Passive ROM;Joint mobilization;Soft tissue mobilization;Myofascial release    Manual therapy comments  supine    Joint Mobilization  R shoulder anterior, posterior, inferior, and  distraction joint mobilizations grade III to tolerance    Soft tissue mobilization  STM to R biceps muscle belly and proximal tendon, pec, posterior delt, UT, LS, scalenes, SCM- tenderness and soft tissue resitriction throughout    Myofascial Release  manual TPR to R UT, LS, biceps- c/o tenderness/tightness    Passive ROM  R shoulder PROM in all directions with focus on IR/ER             PT Education - 05/01/19 1147    Education Details  edu on importance of continuing activity and movement with R shoulder as long as prolonged and repetitive overhead movement in avoided; edu on shoulder impingement causes and symptoms    Person(s) Educated  Patient    Methods  Explanation;Demonstration;Tactile cues;Verbal cues    Comprehension  Verbalized understanding;Returned demonstration       PT Short Term Goals - 04/17/19 1148      PT SHORT TERM GOAL #1   Title  Patient to be independent with initial HEP.    Time  2    Period  Weeks    Status  Achieved    Target Date  04/24/19        PT Long Term Goals - 04/19/19 1132      PT LONG TERM GOAL #1   Title  Patient to be independent with advanced HEP.    Time  4    Period  Weeks    Status  On-going      PT LONG TERM GOAL #2   Title  Patient to demonstrate [place] [facility] and pain-free R shoulder AROM/PROM.    Time  4    Period  Weeks    Status  On-going      PT LONG TERM GOAL #3   Title  Patient to demonstrate >=4+/5 strength in R UE and demonstrate atleast 40lbs in R grip strength.    Time  4    Period  Weeks    Status  On-going      PT LONG TERM GOAL #4   Title  Patient to report 70% improvement in pain levels with overhead reaching.    Time  4    Period  Weeks    Status  On-going      PT LONG TERM GOAL #5   Title  Patient to report <3/10 pain in R UE with shaving.    Time  4  Period  Weeks    Status  On-going            Plan - 05/01/19 1151    Clinical Impression Statement  Patient reporting developing fear of  movement d/t not wanting to injure/flare himself up. Educated patient on importance of continuing activity with modifications as to avoid exacerbation of shoulder pain. Patient reported understanding. Patient tolerated R shoulder PROM, joint mobilizations, and STM to improve joint mobility, ROM, and soft tissue restriction. Patient still quite painful with palpation over shoulder and cervical musculature. Would probably benefit from DN to R UT, LS, pec, biceps for improvement in muscle restriction and pain. Continued with gentle AAROM and AROM to patient's tolerance. Ended session with [place] and moist heat to R shoulder per patient's request. Reported relief of shoulder pain at end of session, with forearm pain remaining.    Comorbidities  OA, LBP, HLD, heart murmur, asthma, DM, CAD, anemia, ADD, achilles tenotomy, L heart cath & angio 06/2017, coronary stent 06/2017, C4-6 fusion    Rehab Potential  Good    PT Treatment/Interventions  ADLs/Self Care Home Management;Cryotherapy;Electrical Stimulation;Moist Heat;Therapeutic exercise;Therapeutic activities;Functional mobility training;Ultrasound;Neuromuscular re-education;Patient/family education;Manual techniques;Vasopneumatic Device;Taping;Energy conservation;Dry needling;Passive range of motion    PT Next Visit Plan  progress postural and R shoulder strengthening activities pt pt. tolerance; progress ROM activities; modalities for pain relief prn    Consulted and Agree with Plan of Care  Patient       Patient will benefit from skilled therapeutic intervention in order to improve the following deficits and impairments:  Hypomobility, Impaired sensation, Decreased activity tolerance, Decreased strength, Increased fascial restricitons, Impaired UE functional use, Pain, Improper body mechanics, Decreased range of motion, Postural dysfunction, Impaired flexibility  Visit Diagnosis: Pain in right arm  Stiffness of right shoulder, not elsewhere  classified  Muscle weakness (generalized)  Abnormal posture     Problem List Patient Active Problem List   Diagnosis Date Noted  . Mild persistent asthma/cough variant asthma 04/13/2019  . Allergic rhinitis with a possible nonallergic component 04/13/2019  . Acid reflux 04/13/2019  . Cough 04/13/2019  . Hoarseness 04/13/2019  . Cervical radicular pain 01/10/2019  . Ulnar neuropathy at elbow of right upper extremity 01/03/2019  . Acute bilateral low back pain without sciatica 01/03/2019  . Onychomycosis 05/25/2018  . Palpitations 07/06/2017  . CAD (coronary artery disease) 07/05/2017  . Family history of early CAD 07/05/2017  . Abnormal findings on diagnostic imaging of cardiovascular system 06/23/2017  . Low vitamin D level 04/22/2017  . Pain in right ankle and joints of right foot 10/05/2016  . Hemarthrosis of right elbow 02/03/2016  . Small thenar eminence 02/03/2016  . Cervical disc disorder with radiculopathy of cervical region 10/07/2015  . Incomplete rotator cuff tear 09/10/2015  . Nonischemic cardiomyopathy ([place]) 05/15/2015  . Fatigue 05/15/2015  . Dyspnea on exertion 05/15/2015  . Decreased cardiac ejection fraction 04/02/2015  . Strain of latissimus dorsi muscle 01/21/2015  . Shoulder bursitis 10/12/2014  . Stenosis of lateral recess of lumbar spine 10/12/2014  . BPPV (benign paroxysmal positional vertigo) 08/14/2014  . Hyperlipidemia 01/19/2014  . Anemia, unspecified 01/19/2014  . ADD (attention deficit disorder) 01/19/2014  . Type 2 diabetes mellitus ([place]) 12/22/2013  . Hypogonadism in male 12/22/2013  . Arthritis of hand, left 12/22/2013     Janene Harvey, PT, DPT 05/01/19 12:04 PM   [facility] [address]  [place], [place], [address] Phone: [phone]  Fax:  [phone]  Name: Reginald Rios MRN: [id number] Date of Birth: 01-06-1956

## 2019-05-03 ENCOUNTER — Other Ambulatory Visit: Payer: Self-pay

## 2019-05-03 ENCOUNTER — Encounter: Payer: Self-pay | Admitting: Physical Therapy

## 2019-05-03 ENCOUNTER — Ambulatory Visit: Payer: 59 | Admitting: Physical Therapy

## 2019-05-03 DIAGNOSIS — M79601 Pain in right arm: Secondary | ICD-10-CM

## 2019-05-03 DIAGNOSIS — G5621 Lesion of ulnar nerve, right upper limb: Secondary | ICD-10-CM | POA: Diagnosis not present

## 2019-05-03 DIAGNOSIS — M6281 Muscle weakness (generalized): Secondary | ICD-10-CM

## 2019-05-03 DIAGNOSIS — M24511 Contracture, right shoulder: Secondary | ICD-10-CM | POA: Diagnosis not present

## 2019-05-03 DIAGNOSIS — M25611 Stiffness of right shoulder, not elsewhere classified: Secondary | ICD-10-CM

## 2019-05-03 DIAGNOSIS — M7501 Adhesive capsulitis of right shoulder: Secondary | ICD-10-CM | POA: Diagnosis not present

## 2019-05-03 DIAGNOSIS — M5412 Radiculopathy, cervical region: Secondary | ICD-10-CM | POA: Diagnosis not present

## 2019-05-03 DIAGNOSIS — R293 Abnormal posture: Secondary | ICD-10-CM

## 2019-05-03 NOTE — Therapy (Signed)
[facility] [address]  [place], [place], [address] Phone: [phone]   Fax:  [phone]  Physical Therapy Progress Note  Patient Details  Name: Reginald Rios MRN: [id number] Date of Birth: [birth year] Referring Provider (PT): [place], [place]   Encounter Date: 05/03/2019  PT End of Session - 05/03/19 1149    Visit Number  8    Number of Visits  20    Date for PT Re-Evaluation  06/14/19    Authorization Type  [facility]    PT Start Time  1101    PT Stop Time  1145    PT Time Calculation (min)  44 min    Activity Tolerance  Patient tolerated treatment well;Patient limited by pain    Behavior During Therapy  [facility] for tasks assessed/performed       Past Medical History:  Diagnosis Date  . ADD (attention deficit disorder)   . Anemia   . Arthritis   . CAD (coronary artery disease)    2/19 PCI/DESx1 to Lcx  . Diabetes mellitus without complication ([place])   . Exercise-induced asthma   . Heart murmur   . Hyperlipidemia    family hx of high cholesterol  . Hypogonadism in male   . Low back pain   . Osteoarthritis     Past Surgical History:  Procedure Laterality Date  . CORONARY STENT INTERVENTION N/A 06/23/2017   Procedure: CORONARY STENT INTERVENTION;  Surgeon: Leonie Man, MD;  Location: [place] [facility];  Service: Cardiovascular;  Laterality: N/A;  . INTRAVASCULAR PRESSURE WIRE/FFR STUDY N/A 06/23/2017   Procedure: INTRAVASCULAR PRESSURE WIRE/FFR STUDY;  Surgeon: Leonie Man, MD;  Location: [place] [facility];  Service: Cardiovascular;  Laterality: N/A;  . KNEE ARTHROSCOPY    . LEFT HEART CATH AND CORONARY ANGIOGRAPHY N/A 06/23/2017   Procedure: LEFT HEART CATH AND CORONARY ANGIOGRAPHY;  Surgeon: Leonie Man, MD;  Location: [place] [facility];  Service: Cardiovascular;  Laterality: N/A;  . TENOTOMY ACHILLES TENDON      There were no vitals filed for this visit.  Subjective  Assessment - 05/03/19 1103    Subjective  Feeling better. Triceps pain is improved but shoulder and forearm are his biggest concerns. Notices popping in dorsal forearm when moving wrist.    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 Chronic denervation in right triceps may be related to underlying right C7 radiculopathy.    Patient Stated Goals  get rid of back    Currently in Pain?  Yes    Pain Score  4     Pain Location  Shoulder    Pain Orientation  Right    Pain Descriptors / Indicators  Sharp    Pain Type  Chronic pain         [facility] PT Assessment - 05/03/19 0001      Assessment   Medical Diagnosis  R arm pain    Referring Provider (PT)  [name], DO    Onset Date/Surgical Date  06/04/18      Observation/Other Assessments   Focus on Therapeutic Outcomes (FOTO)   42%(58% limitation)      AROM   Right Shoulder Flexion  120 Degrees    Right Shoulder  ABduction  117 Degrees    Right Shoulder Internal Rotation  --   FIR to proximal glute   Right Shoulder External Rotation  --   FER to back of head     PROM   Right/Left Shoulder  Right    Right Shoulder Flexion  135 Degrees    Right Shoulder ABduction  120 Degrees    Right Shoulder Internal Rotation  89 Degrees    Right Shoulder External Rotation  55 Degrees      Strength   Right Shoulder Flexion  4+/5    Right Shoulder ABduction  4/5    Right Shoulder Internal Rotation  4+/5    Right Shoulder External Rotation  4-/5    Right Elbow Flexion  4/5    Right Elbow Extension  4+/5    Right Wrist Flexion  4/5    Right Wrist Extension  4+/5    Right Hand Grip (lbs)  40.33   38, 40, 43                  [facility] Adult PT Treatment/Exercise - 05/03/19 0001      Shoulder Exercises: Pulleys   Flexion   3 minutes    Flexion Limitations  to tolerance    Scaption  3 minutes    Scaption Limitations  to tolerance      Shoulder Exercises: Stretch   Other Shoulder Stretches  R shoulder IR/ER stretch with strap 5x5" to tolerance   cues for gentle stretch     Manual Therapy   Manual Therapy  Passive ROM    Manual therapy comments  supine    Passive ROM  R shoulder PROM in all directions with focus on IR/ER   c/o pain at end ranges of IR/ER            PT Education - 05/03/19 1149    Education Details  update to HEP; discussion on objective progress and remaining limitations    Person(s) Educated  Patient    Methods  Explanation;Demonstration;Tactile cues;Verbal cues;Handout    Comprehension  Verbalized understanding;Returned demonstration       PT Short Term Goals - 05/03/19 1106      PT SHORT TERM GOAL #1   Title  Patient to be independent with initial HEP.    Time  2    Period  Weeks    Status  Achieved    Target Date  04/24/19        PT Long Term Goals - 05/03/19 1106      PT LONG TERM GOAL #1   Title  Patient to be independent with advanced HEP.    Time  6    Period  Weeks    Status  Partially Met   met for current   Target Date  06/14/19      PT LONG TERM GOAL #2   Title  Patient to demonstrate [facility] and pain-free R shoulder AROM/PROM.    Time  6    Period  Weeks    Status  Partially Met   PROM improved in shoulder abduction, IR, ER and AROM in flexion and abduction   Target Date  06/14/19      PT LONG TERM GOAL #3   Title  Patient to demonstrate >=4+/5 strength in R UE and demonstrate atleast 40lbs in R grip strength.    Time  6    Period  Weeks    Status  Partially Met   met for  grip strength; shoulder strength improved in all planes as well as elbow and wrist extension strength   Target Date  06/14/19      PT LONG TERM GOAL #4   Title  Patient to report 70% improvement in pain levels with overhead reaching.    Time  4    Period  Weeks    Status   Achieved   reporting 70% improvement     PT LONG TERM GOAL #5   Title  Patient to report <3/10 pain in R UE with shaving.    Time  6    Period  Weeks    Status  On-going   reporting 8/10 pain with shaving   Target Date  06/14/19            Plan - 05/03/19 1153    Clinical Impression Statement  Patient arrived to session with report of improvement in R posterior arm pain, with R shoulder and forearm pain still remaining. Patient at this time noting 70% improvement in pain levels with overhead reaching. However, still noting 8/10 pain with shaving. Today patient demonstrating improvement in R shoulder PROM in abduction, IR, ER and AROM in flexion and abduction. Shoulder IR and ER seem to still be most limiting. Strength testing revealed improvement shoulder strength in all planes as well as elbow and wrist extension strength. Grip strength goal met today. Introduced gentle IR and ER stretch with strap with cues to perform to tolerance. Updated this exercise into HEP as patient tolerated this well. Ended session without complaints. Patient progressing well towards goals. Still demonstrating room for improvement in ROM, strength, and functional activity tolerance. Would benefit from additional skilled PT services 2x/week for 6 weeks to address remaining goals.    Comorbidities  OA, LBP, HLD, heart murmur, asthma, DM, CAD, anemia, ADD, achilles tenotomy, L heart cath & angio 06/2017, coronary stent 06/2017, C4-6 fusion    Rehab Potential  Good    PT Frequency  2x / week    PT Duration  6 weeks    PT Treatment/Interventions  ADLs/Self Care Home Management;Cryotherapy;Electrical Stimulation;Moist Heat;Therapeutic exercise;Therapeutic activities;Functional mobility training;Ultrasound;Neuromuscular re-education;Patient/family education;Manual techniques;Vasopneumatic Device;Taping;Energy conservation;Dry needling;Passive range of motion;Iontophoresis 15m/ml Dexamethasone    PT Next Visit Plan   progress postural and R shoulder strengthening activities pt pt. tolerance; progress ROM activities; modalities for pain relief prn    Consulted and Agree with Plan of Care  Patient       Patient will benefit from skilled therapeutic intervention in order to improve the following deficits and impairments:  Hypomobility, Impaired sensation, Decreased activity tolerance, Decreased strength, Increased fascial restricitons, Impaired UE functional use, Pain, Improper body mechanics, Decreased range of motion, Postural dysfunction, Impaired flexibility  Visit Diagnosis: Pain in right arm  Stiffness of right shoulder, not elsewhere classified  Muscle weakness (generalized)  Abnormal posture     Problem List Patient Active Problem List   Diagnosis Date Noted  . Mild persistent asthma/cough variant asthma 04/13/2019  . Allergic rhinitis with a possible nonallergic component 04/13/2019  . Acid reflux 04/13/2019  . Cough 04/13/2019  . Hoarseness 04/13/2019  . Cervical radicular pain 01/10/2019  . Ulnar neuropathy at elbow of right upper extremity 01/03/2019  . Acute bilateral low back pain without sciatica 01/03/2019  . Onychomycosis 05/25/2018  . Palpitations 07/06/2017  . CAD (coronary artery disease) 07/05/2017  . Family history of early CAD 07/05/2017  . Abnormal findings on diagnostic imaging of cardiovascular system 06/23/2017  . Low vitamin D  level 04/22/2017  . Pain in right ankle and joints of right foot 10/05/2016  . Hemarthrosis of right elbow 02/03/2016  . Small thenar eminence 02/03/2016  . Cervical disc disorder with radiculopathy of cervical region 10/07/2015  . Incomplete rotator cuff tear 09/10/2015  . Nonischemic cardiomyopathy ([place]) 05/15/2015  . Fatigue 05/15/2015  . Dyspnea on exertion 05/15/2015  . Decreased cardiac ejection fraction 04/02/2015  . Strain of latissimus dorsi muscle 01/21/2015  . Shoulder bursitis 10/12/2014  . Stenosis of lateral recess of  lumbar spine 10/12/2014  . BPPV (benign paroxysmal positional vertigo) 08/14/2014  . Hyperlipidemia 01/19/2014  . Anemia, unspecified 01/19/2014  . ADD (attention deficit disorder) 01/19/2014  . Type 2 diabetes mellitus ([place]) 12/22/2013  . Hypogonadism in male 12/22/2013  . Arthritis of hand, left 12/22/2013     Janene Harvey, PT, DPT 05/03/19 12:03 PM   [facility] [address]  [place], [place], [address] Phone: [phone]   Fax:  [phone]  Name: KEYNAN HEFFERN MRN: [id number] Date of Birth: 1955/07/11

## 2019-05-08 ENCOUNTER — Ambulatory Visit: Payer: 59 | Attending: Family Medicine

## 2019-05-08 ENCOUNTER — Other Ambulatory Visit: Payer: Self-pay

## 2019-05-08 DIAGNOSIS — M25611 Stiffness of right shoulder, not elsewhere classified: Secondary | ICD-10-CM | POA: Diagnosis not present

## 2019-05-08 DIAGNOSIS — M79601 Pain in right arm: Secondary | ICD-10-CM | POA: Diagnosis not present

## 2019-05-08 DIAGNOSIS — R293 Abnormal posture: Secondary | ICD-10-CM | POA: Insufficient documentation

## 2019-05-08 DIAGNOSIS — M6281 Muscle weakness (generalized): Secondary | ICD-10-CM | POA: Insufficient documentation

## 2019-05-08 MED FILL — FARXIGA 10 MG TABLET: 10 | 30 days supply | Qty: 30 | Fill #1

## 2019-05-08 NOTE — Therapy (Signed)
[facility] [address]  [place], [place], [address] Phone: [phone]   Fax:  [phone]  Physical Therapy Treatment  Patient Details  Name: Reginald Rios MRN: [id number] Date of Birth: 1956-03-18 Referring Provider (PT): Crosby Oyster [place], [place]   Encounter Date: 05/08/2019  PT End of Session - 05/08/19 1453    Visit Number  9    Number of Visits  20    Date for PT Re-Evaluation  06/14/19    Authorization Type  [facility]    PT Start Time  1449    PT Stop Time  1558    PT Time Calculation (min)  69 min    Activity Tolerance  Patient tolerated treatment well    Behavior During Therapy  [facility] for tasks assessed/performed       Past Medical History:  Diagnosis Date  . ADD (attention deficit disorder)   . Anemia   . Arthritis   . CAD (coronary artery disease)    2/19 PCI/DESx1 to Lcx  . Diabetes mellitus without complication ([place])   . Exercise-induced asthma   . Heart murmur   . Hyperlipidemia    family hx of high cholesterol  . Hypogonadism in male   . Low back pain   . Osteoarthritis     Past Surgical History:  Procedure Laterality Date  . CORONARY STENT INTERVENTION N/A 06/23/2017   Procedure: CORONARY STENT INTERVENTION;  Surgeon: Leonie Man, MD;  Location: [facility];  Service: Cardiovascular;  Laterality: N/A;  . INTRAVASCULAR PRESSURE WIRE/FFR STUDY N/A 06/23/2017   Procedure: INTRAVASCULAR PRESSURE WIRE/FFR STUDY;  Surgeon: Leonie Man, MD;  Location: [facility];  Service: Cardiovascular;  Laterality: N/A;  . KNEE ARTHROSCOPY    . LEFT HEART CATH AND CORONARY ANGIOGRAPHY N/A 06/23/2017   Procedure: LEFT HEART CATH AND CORONARY ANGIOGRAPHY;  Surgeon: Leonie Man, MD;  Location: [facility];  Service: Cardiovascular;  Laterality: N/A;  . TENOTOMY ACHILLES TENDON      There were no vitals filed for this visit.  Subjective Assessment - 05/08/19 1456    Subjective  Pt. noting he is able to avoid pain at times with rest which is new for him.    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 Chronic denervation in right triceps may be related to underlying right C7 radiculopathy.    Patient Stated Goals  get rid of back    Currently in Pain?  Yes    Pain Score  3    up to 6/10 at times   Pain Location  Shoulder    Pain Orientation  Right    Pain Descriptors / Indicators  Sharp    Pain Type  Chronic pain    Pain Radiating Towards  sharp pain into R elbo and at times into R wrist and R lateral hand    Pain Onset  More than a month ago    Pain Frequency  Intermittent    Aggravating Factors   overhead work while remodelling home    Multiple Pain Sites  No         [facility] PT Assessment - 05/08/19 0001      PROM   PROM Assessment Site  Shoulder  Right/Left Shoulder  Right    Right Shoulder External Rotation  65 Degrees                   [facility] Adult PT Treatment/Exercise - 05/08/19 0001      Shoulder Exercises: Supine   External Rotation  AAROM;Right;15 reps   5" hold   External Rotation Limitations  pillow under elbow     Flexion  Right;15 reps;AAROM    Flexion Limitations  wand; 5" hold     Other Supine Exercises  R shoulder flexion AROM 2# x 10 reps       Shoulder Exercises: Prone   Retraction  Right;10 reps;Strengthening    Retraction Weight (lbs)  1    Retraction Limitations  prone on mat table    tactile cueing required for scapular motion   Extension  Right;10 reps;Strengthening    Extension Weight (lbs)  1    Extension Limitations  prone on mat table    tactile cueing required for scapular motion     Shoulder Exercises: Sidelying   External Rotation  Right;12 reps;AROM    External Rotation Weight (lbs)  1     External Rotation Limitations  1# x 12 reps     ABduction  Right;15 reps;Strengthening;AROM    ABduction Limitations  improved tolerance with thumb in neutral;  improved scapular upward rotation with tactile cueing       Shoulder Exercises: Pulleys   Flexion  3 minutes    Flexion Limitations  to tolerance    Scaption  3 minutes    Scaption Limitations  to tolerance      Electrical Stimulation   Electrical Stimulation Location  R shoulder complex     Electrical Stimulation Action  IFC    Electrical Stimulation Parameters  80-'150Hz' , intensity to pt. tolerance, 15'    Electrical Stimulation Goals  Pain;Tone      Manual Therapy   Manual Therapy  Passive ROM    Manual therapy comments  supine    Passive ROM  Prolonged holds with PROM in all directions for R shoulder                PT Short Term Goals - 05/03/19 1106      PT SHORT TERM GOAL #1   Title  Patient to be independent with initial HEP.    Time  2    Period  Weeks    Status  Achieved    Target Date  04/24/19        PT Long Term Goals - 05/03/19 1106      PT LONG TERM GOAL #1   Title  Patient to be independent with advanced HEP.    Time  6    Period  Weeks    Status  Partially Met   met for current   Target Date  06/14/19      PT LONG TERM GOAL #2   Title  Patient to demonstrate [facility] and pain-free R shoulder AROM/PROM.    Time  6    Period  Weeks    Status  Partially Met   PROM improved in shoulder abduction, IR, ER and AROM in flexion and abduction   Target Date  06/14/19      PT LONG TERM GOAL #3   Title  Patient to demonstrate >=4+/5 strength in R UE and demonstrate atleast 40lbs in R grip strength.    Time  6    Period  Weeks  Status  Partially Met   met for grip strength; shoulder strength improved in all planes as well as elbow and wrist extension strength   Target Date  06/14/19      PT LONG TERM GOAL #4   Title  Patient to report 70% improvement in pain levels with overhead reaching.     Time  4    Period  Weeks    Status  Achieved   reporting 70% improvement     PT LONG TERM GOAL #5   Title  Patient to report <3/10 pain in R UE with shaving.    Time  6    Period  Weeks    Status  On-going   reporting 8/10 pain with shaving   Target Date  06/14/19            Plan - 05/08/19 1453    Clinical Impression Statement  Pt. noting he is now able to brush his hair with improved comfort level.  Notes he has been able to avoid R shoulder over-exertion over weekend however notes he has to perform overhead work on remodeling in his bathroom this week.  Tolerated progression of periscapular strengthening and ROM activities well today.  R shoulder PROM ER progressed to 65 dg today with pt. noting improved ability to brush hair.  Ended visit with 3/10 "dull ache" in R shoulder thus applied E-stim for pain relief with good overall relief noted.    Comorbidities  OA, LBP, HLD, heart murmur, asthma, DM, CAD, anemia, ADD, achilles tenotomy, L heart cath & angio 06/2017, coronary stent 06/2017, C4-6 fusion    Rehab Potential  Good    PT Treatment/Interventions  ADLs/Self Care Home Management;Cryotherapy;Electrical Stimulation;Moist Heat;Therapeutic exercise;Therapeutic activities;Functional mobility training;Ultrasound;Neuromuscular re-education;Patient/family education;Manual techniques;Vasopneumatic Device;Taping;Energy conservation;Dry needling;Passive range of motion;Iontophoresis 91m/ml Dexamethasone    PT Next Visit Plan  progress postural and R shoulder strengthening activities pt pt. tolerance; progress ROM activities; modalities for pain relief prn    Consulted and Agree with Plan of Care  Patient       Patient will benefit from skilled therapeutic intervention in order to improve the following deficits and impairments:  Hypomobility, Impaired sensation, Decreased activity tolerance, Decreased strength, Increased fascial restricitons, Impaired UE functional use, Pain, Improper  body mechanics, Decreased range of motion, Postural dysfunction, Impaired flexibility  Visit Diagnosis: Pain in right arm  Stiffness of right shoulder, not elsewhere classified  Muscle weakness (generalized)  Abnormal posture     Problem List Patient Active Problem List   Diagnosis Date Noted  . Mild persistent asthma/cough variant asthma 04/13/2019  . Allergic rhinitis with a possible nonallergic component 04/13/2019  . Acid reflux 04/13/2019  . Cough 04/13/2019  . Hoarseness 04/13/2019  . Cervical radicular pain 01/10/2019  . Ulnar neuropathy at elbow of right upper extremity 01/03/2019  . Acute bilateral low back pain without sciatica 01/03/2019  . Onychomycosis 05/25/2018  . Palpitations 07/06/2017  . CAD (coronary artery disease) 07/05/2017  . Family history of early CAD 07/05/2017  . Abnormal findings on diagnostic imaging of cardiovascular system 06/23/2017  . Low vitamin D level 04/22/2017  . Pain in right ankle and joints of right foot 10/05/2016  . Hemarthrosis of right elbow 02/03/2016  . Small thenar eminence 02/03/2016  . Cervical disc disorder with radiculopathy of cervical region 10/07/2015  . Incomplete rotator cuff tear 09/10/2015  . [place]ischemic cardiomyopathy ([place] 05/15/2015  . Fatigue 05/15/2015  . Dyspnea on exertion 05/15/2015  . Decreased cardiac ejection fraction 04/02/2015  .  Strain of latissimus dorsi muscle 01/21/2015  . Shoulder bursitis 10/12/2014  . Stenosis of lateral recess of lumbar spine 10/12/2014  . BPPV (benign paroxysmal positional vertigo) 08/14/2014  . Hyperlipidemia 01/19/2014  . Anemia, unspecified 01/19/2014  . ADD (attention deficit disorder) 01/19/2014  . Type 2 diabetes mellitus ([place]) 12/22/2013  . Hypogonadism in male 12/22/2013  . Arthritis of hand, left 12/22/2013    Bess Harvest, PTA 05/08/19 4:00 PM'   [facility] [address]  [place],  [place], [address] Phone: [phone]   Fax:  [phone]  Name: Reginald Rios MRN: [id number] Date of Birth: January 12, 1956

## 2019-05-10 ENCOUNTER — Encounter: Payer: Self-pay | Admitting: Physical Therapy

## 2019-05-10 ENCOUNTER — Other Ambulatory Visit: Payer: Self-pay

## 2019-05-10 ENCOUNTER — Ambulatory Visit: Payer: 59 | Admitting: Physical Therapy

## 2019-05-10 DIAGNOSIS — M6281 Muscle weakness (generalized): Secondary | ICD-10-CM | POA: Diagnosis not present

## 2019-05-10 DIAGNOSIS — R293 Abnormal posture: Secondary | ICD-10-CM | POA: Diagnosis not present

## 2019-05-10 DIAGNOSIS — M79601 Pain in right arm: Secondary | ICD-10-CM | POA: Diagnosis not present

## 2019-05-10 DIAGNOSIS — M25611 Stiffness of right shoulder, not elsewhere classified: Secondary | ICD-10-CM | POA: Diagnosis not present

## 2019-05-10 NOTE — Therapy (Signed)
[facility] [address]  [place], [place], [address] Phone: [phone]   Fax:  [phone]  Physical Therapy Treatment  Patient Details  Name: Reginald Rios MRN: [id number] Date of Birth: 02-Jun-1955 Referring Provider (PT): Crosby Oyster [place], [place]   Encounter Date: 05/10/2019  PT End of Session - 05/10/19 1148    Visit Number  10    Number of Visits  20    Date for PT Re-Evaluation  06/14/19    Authorization Type  [facility]    PT Start Time  1101    PT Stop Time  1144    PT Time Calculation (min)  43 min    Activity Tolerance  Patient tolerated treatment well;Patient limited by pain    Behavior During Therapy  [facility] for tasks assessed/performed       Past Medical History:  Diagnosis Date  . ADD (attention deficit disorder)   . Anemia   . Arthritis   . CAD (coronary artery disease)    2/19 PCI/DESx1 to Lcx  . Diabetes mellitus without complication ([place])   . Exercise-induced asthma   . Heart murmur   . Hyperlipidemia    family hx of high cholesterol  . Hypogonadism in male   . Low back pain   . Osteoarthritis     Past Surgical History:  Procedure Laterality Date  . CORONARY STENT INTERVENTION N/A 06/23/2017   Procedure: CORONARY STENT INTERVENTION;  Surgeon: Leonie Man, MD;  Location: [facility];  Service: Cardiovascular;  Laterality: N/A;  . INTRAVASCULAR PRESSURE WIRE/FFR STUDY N/A 06/23/2017   Procedure: INTRAVASCULAR PRESSURE WIRE/FFR STUDY;  Surgeon: Leonie Man, MD;  Location: [facility];  Service: Cardiovascular;  Laterality: N/A;  . KNEE ARTHROSCOPY    . LEFT HEART CATH AND CORONARY ANGIOGRAPHY N/A 06/23/2017   Procedure: LEFT HEART CATH AND CORONARY ANGIOGRAPHY;  Surgeon: Leonie Man, MD;  Location: [facility];  Service: Cardiovascular;  Laterality: N/A;  . TENOTOMY ACHILLES TENDON      There were no vitals filed for this visit.  Subjective Assessment  - 05/10/19 1102    Subjective  Right after last session, his R forearm and digits 2-3 swelled up and pain levels increased.  Now having difficulty bending his elbow. Took some tylenol. Shoulder is feeling better. Denies redness, warmth, rash.    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 Chronic denervation in right triceps may be related to underlying right C7 radiculopathy.    Patient Stated Goals  get rid of back    Currently in Pain?  Yes    Pain Score  8    Pain Location  Elbow    Pain Orientation  Right;Posterior    Pain Descriptors / Indicators  Sharp    Pain Type  Acute pain    Pain Radiating Towards  dorsal forearm         [facility] PT Assessment - 05/10/19 0001      Palpation   Palpation comment  TTP over R brachioradialis and wrist flexors; no edema                   [facility] Adult PT Treatment/Exercise - 05/10/19 0001      Shoulder  Exercises: Supine   Protraction  Strengthening;Right;10 reps;Weights    Protraction Weight (lbs)  2    Protraction Limitations  serratus punch    Flexion  Strengthening;Right;10 reps    Shoulder Flexion Weight (lbs)  1    Flexion Limitations  to tolerance      Shoulder Exercises: Sidelying   External Rotation  Strengthening;Right;10 reps;Weights    External Rotation Weight (lbs)  1    External Rotation Limitations  cues to maintain wrist in neutral to avoid forearm pain    ABduction  Strengthening;Right;10 reps;Weights    ABduction Weight (lbs)  1    ABduction Limitations  unable to perform d/t forearm pain with thumb up position      Shoulder Exercises: Pulleys   Flexion  3 minutes    Flexion Limitations  to tolerance    Scaption  3 minutes    Scaption Limitations  to tolerance      Manual Therapy   Manual Therapy  Soft  tissue mobilization    Manual therapy comments  sitting    Soft tissue mobilization  STM to R UT, LS, scalenes, cervical paraspinals- increased soft tissue restriction and c/o tenderness    Myofascial Release  manual TPR to R UT- soft tissue restriction and tenderness evident             PT Education - 05/10/19 1147    Education Details  verbal review of HEP for understanding of maintaining wrist in neutral and avoidance of pain; advised patient to f/u with MD for flare up in elbow/forearm pain    Person(s) Educated  Patient    Methods  Explanation;Demonstration;Verbal cues;Tactile cues    Comprehension  Verbalized understanding       PT Short Term Goals - 05/03/19 1106      PT SHORT TERM GOAL #1   Title  Patient to be independent with initial HEP.    Time  2    Period  Weeks    Status  Achieved    Target Date  04/24/19        PT Long Term Goals - 05/03/19 1106      PT LONG TERM GOAL #1   Title  Patient to be independent with advanced HEP.    Time  6    Period  Weeks    Status  Partially Met   met for current   Target Date  06/14/19      PT LONG TERM GOAL #2   Title  Patient to demonstrate [facility] and pain-free R shoulder AROM/PROM.    Time  6    Period  Weeks    Status  Partially Met   PROM improved in shoulder abduction, IR, ER and AROM in flexion and abduction   Target Date  06/14/19      PT LONG TERM GOAL #3   Title  Patient to demonstrate >=4+/5 strength in R UE and demonstrate atleast 40lbs in R grip strength.    Time  6    Period  Weeks    Status  Partially Met   met for grip strength; shoulder strength improved in all planes as well as elbow and wrist extension strength   Target Date  06/14/19      PT LONG TERM GOAL #4   Title  Patient to report 70% improvement in pain levels with overhead reaching.    Time  4    Period  Weeks    Status  Achieved   reporting  70% improvement     PT LONG TERM GOAL #5   Title  Patient to report <3/10 pain in R UE  with shaving.    Time  6    Period  Weeks    Status  On-going   reporting 8/10 pain with shaving   Target Date  06/14/19            Plan - 05/10/19 1149    Clinical Impression Statement  Patient reporting increase in edema in R dorsal forearm in digits 2-3 after last session, accompanied by increased pain. Denies warmth, redness, or rash. Now having trouble bending his elbow, but shoulder pain seems improved. Upon examination, patient tender over R brachioradialis and wrist flexors but without remaining edema. Also having considerable difficulty and pain with flexion, extension, and supination of R elbow. Worked on R shoulder ther-ex to patient's tolerance today. Patient required cues to maintain wrist neutral during exercises today as repetitive wrist motion worsened his pain, with report of relief with wrist in neutral.  Patient reported onset of R UT pain, thus addressed this with STM and TPR to R UT, LS, scalenes, cervical paraspinals. Patient reported relief after manual therapy. Advised patient to maintain neutral wrist with HEP and to f/u with MD d/t this recent worsening of elbow pain. Patient reported understanding and with report of slightly decreased R elbow/forearm pain at end of session.    Comorbidities  OA, LBP, HLD, heart murmur, asthma, DM, CAD, anemia, ADD, achilles tenotomy, L heart cath & angio 06/2017, coronary stent 06/2017, C4-6 fusion    Rehab Potential  Good    PT Treatment/Interventions  ADLs/Self Care Home Management;Cryotherapy;Electrical Stimulation;Moist Heat;Therapeutic exercise;Therapeutic activities;Functional mobility training;Ultrasound;Neuromuscular re-education;Patient/family education;Manual techniques;Vasopneumatic Device;Taping;Energy conservation;Dry needling;Passive range of motion;Iontophoresis 76m/ml Dexamethasone    PT Next Visit Plan  progress postural and R shoulder strengthening activities pt pt. tolerance; progress ROM activities; modalities for  pain relief prn    Consulted and Agree with Plan of Care  Patient       Patient will benefit from skilled therapeutic intervention in order to improve the following deficits and impairments:  Hypomobility, Impaired sensation, Decreased activity tolerance, Decreased strength, Increased fascial restricitons, Impaired UE functional use, Pain, Improper body mechanics, Decreased range of motion, Postural dysfunction, Impaired flexibility  Visit Diagnosis: Pain in right arm  Stiffness of right shoulder, not elsewhere classified  Muscle weakness (generalized)  Abnormal posture     Problem List Patient Active Problem List   Diagnosis Date Noted  . Mild persistent asthma/cough variant asthma 04/13/2019  . Allergic rhinitis with a possible nonallergic component 04/13/2019  . Acid reflux 04/13/2019  . Cough 04/13/2019  . Hoarseness 04/13/2019  . Cervical radicular pain 01/10/2019  . Ulnar neuropathy at elbow of right upper extremity 01/03/2019  . Acute bilateral low back pain without sciatica 01/03/2019  . Onychomycosis 05/25/2018  . Palpitations 07/06/2017  . CAD (coronary artery disease) 07/05/2017  . Family history of early CAD 07/05/2017  . Abnormal findings on diagnostic imaging of cardiovascular system 06/23/2017  . Low vitamin D level 04/22/2017  . Pain in right ankle and joints of right foot 10/05/2016  . Hemarthrosis of right elbow 02/03/2016  . Small thenar eminence 02/03/2016  . Cervical disc disorder with radiculopathy of cervical region 10/07/2015  . Incomplete rotator cuff tear 09/10/2015  . Nonischemic cardiomyopathy ([place] 05/15/2015  . Fatigue 05/15/2015  . Dyspnea on exertion 05/15/2015  . Decreased cardiac ejection fraction 04/02/2015  . Strain of latissimus dorsi muscle  01/21/2015  . Shoulder bursitis 10/12/2014  . Stenosis of lateral recess of lumbar spine 10/12/2014  . BPPV (benign paroxysmal positional vertigo) 08/14/2014  . Hyperlipidemia 01/19/2014  .  Anemia, unspecified 01/19/2014  . ADD (attention deficit disorder) 01/19/2014  . Type 2 diabetes mellitus ([place]) 12/22/2013  . Hypogonadism in male 12/22/2013  . Arthritis of hand, left 12/22/2013     Janene Harvey, PT, DPT 05/10/19 11:55 AM   [facility] [address]  [place], [place], [address] Phone: [phone]   Fax:  [phone]  Name: Reginald Rios MRN: [id number] Date of Birth: 06-25-1955

## 2019-05-11 DIAGNOSIS — H35371 Puckering of macula, right eye: Secondary | ICD-10-CM | POA: Diagnosis not present

## 2019-05-11 DIAGNOSIS — H21341 Primary cyst of pars plana, right eye: Secondary | ICD-10-CM | POA: Diagnosis not present

## 2019-05-11 DIAGNOSIS — E119 Type 2 diabetes mellitus without complications: Secondary | ICD-10-CM | POA: Diagnosis not present

## 2019-05-11 DIAGNOSIS — H4423 Degenerative myopia, bilateral: Secondary | ICD-10-CM | POA: Diagnosis not present

## 2019-05-11 DIAGNOSIS — H43821 Vitreomacular adhesion, right eye: Secondary | ICD-10-CM | POA: Diagnosis not present

## 2019-05-15 ENCOUNTER — Ambulatory Visit: Payer: 59

## 2019-05-15 MED FILL — PREGABALIN 75 MG CAPS: 75 | 30 days supply | Qty: 60 | Fill #1

## 2019-05-16 MED FILL — JANUVIA 100 MG TABLET: 100 | 30 days supply | Qty: 30 | Fill #3

## 2019-05-17 ENCOUNTER — Encounter: Payer: 59 | Admitting: Physical Therapy

## 2019-05-19 ENCOUNTER — Other Ambulatory Visit: Payer: Self-pay

## 2019-05-19 ENCOUNTER — Encounter: Payer: 59 | Attending: Physical Medicine & Rehabilitation | Admitting: Physical Medicine & Rehabilitation

## 2019-05-19 ENCOUNTER — Encounter: Payer: Self-pay | Admitting: Physical Medicine & Rehabilitation

## 2019-05-19 VITALS — BP 115/76 | HR 85 | Temp 97.5°F | Ht 70.0 in | Wt 198.0 lb

## 2019-05-19 DIAGNOSIS — M7501 Adhesive capsulitis of right shoulder: Secondary | ICD-10-CM | POA: Insufficient documentation

## 2019-05-19 DIAGNOSIS — M25521 Pain in right elbow: Secondary | ICD-10-CM | POA: Diagnosis not present

## 2019-05-19 DIAGNOSIS — M24511 Contracture, right shoulder: Secondary | ICD-10-CM | POA: Insufficient documentation

## 2019-05-19 DIAGNOSIS — G5621 Lesion of ulnar nerve, right upper limb: Secondary | ICD-10-CM | POA: Insufficient documentation

## 2019-05-19 DIAGNOSIS — G8929 Other chronic pain: Secondary | ICD-10-CM | POA: Diagnosis not present

## 2019-05-19 DIAGNOSIS — M5412 Radiculopathy, cervical region: Secondary | ICD-10-CM | POA: Insufficient documentation

## 2019-05-19 MED ORDER — LIDOCAINE 5 % EX PTCH: 1.0000 | MEDICATED_PATCH | CUTANEOUS | 0 refills | Status: DC

## 2019-05-19 MED FILL — LIDOCAINE PATCH 5%: 5 | 30 days supply | Qty: 30 | Fill #0

## 2019-05-19 NOTE — Progress Notes (Signed)
LEFT Acromioclavicular joint injection under ultrasound guidance  Indication is for anterior shoulder pain with imaging studies demonstrating acromioclavicular joint arthropathy Pain is only partially responsive to medication management and other conservative care. Pain interferes with ADLs and sleep  The patient was placed in a seated position the acromioclavicular joint was scanned in the long axis view, areas marked prepped with Betadine, sterile technique was utilized. Under direct long axis view 1% lidocaine was infiltrated to the skin and subcutaneous tissues. A 25-gauge 1.5 inch needle reached the acromioclavicular joint space. Then a solution containing 1 mL of lidocaine 1% and 0.5 ML of Celestone 6 mg per mL was injected. Patient tolerated procedure well. Images were saved. Postprocedure instructions given Cont PT

## 2019-05-19 NOTE — Patient Instructions (Signed)
Last visit Right glenohumeral injection   THis visit R AC joint injection   Next visit review Right elbow xray and neck symptoms

## 2019-05-22 ENCOUNTER — Encounter: Payer: Self-pay | Admitting: Physical Therapy

## 2019-05-22 ENCOUNTER — Ambulatory Visit (HOSPITAL_BASED_OUTPATIENT_CLINIC_OR_DEPARTMENT_OTHER)
Admission: RE | Admit: 2019-05-22 | Discharge: 2019-05-22 | Disposition: A | Discharge status: Home / Self Care | Payer: 59 | Source: Ambulatory Visit | Attending: Physical Medicine & Rehabilitation | Admitting: Physical Medicine & Rehabilitation

## 2019-05-22 ENCOUNTER — Other Ambulatory Visit: Payer: Self-pay

## 2019-05-22 ENCOUNTER — Encounter: Payer: Self-pay | Admitting: Family Medicine

## 2019-05-22 ENCOUNTER — Ambulatory Visit: Payer: 59 | Admitting: Physical Therapy

## 2019-05-22 DIAGNOSIS — G8929 Other chronic pain: Secondary | ICD-10-CM | POA: Insufficient documentation

## 2019-05-22 DIAGNOSIS — M79601 Pain in right arm: Secondary | ICD-10-CM

## 2019-05-22 DIAGNOSIS — M25521 Pain in right elbow: Secondary | ICD-10-CM | POA: Diagnosis not present

## 2019-05-22 DIAGNOSIS — M19021 Primary osteoarthritis, right elbow: Secondary | ICD-10-CM | POA: Diagnosis not present

## 2019-05-22 DIAGNOSIS — R293 Abnormal posture: Secondary | ICD-10-CM

## 2019-05-22 DIAGNOSIS — M25611 Stiffness of right shoulder, not elsewhere classified: Secondary | ICD-10-CM

## 2019-05-22 DIAGNOSIS — M6281 Muscle weakness (generalized): Secondary | ICD-10-CM

## 2019-05-22 NOTE — Therapy (Signed)
[facility] [address]  [place], [place], [address] Phone: [phone]   Fax:  [phone]  Physical Therapy Treatment  Patient Details  Name: Reginald Rios MRN: [id number] Date of Birth: [birth year] Referring Provider (PT): Crosby Oyster [place], [place]   Encounter Date: 05/22/2019  PT End of Session - 05/22/19 1152    Visit Number  11    Number of Visits  20    Date for PT Re-Evaluation  06/14/19    Authorization Type  [facility]    PT Start Time  1104    PT Stop Time  1148    PT Time Calculation (min)  44 min    Activity Tolerance  Patient tolerated treatment well;Patient limited by pain    Behavior During Therapy  [facility] for tasks assessed/performed       Past Medical History:  Diagnosis Date  . ADD (attention deficit disorder)   . Anemia   . Arthritis   . CAD (coronary artery disease)    2/19 PCI/DESx1 to Lcx  . Diabetes mellitus without complication ([place])   . Exercise-induced asthma   . Heart murmur   . Hyperlipidemia    family hx of high cholesterol  . Hypogonadism in male   . Low back pain   . Osteoarthritis     Past Surgical History:  Procedure Laterality Date  . CORONARY STENT INTERVENTION N/A 06/23/2017   Procedure: CORONARY STENT INTERVENTION;  Surgeon: Leonie Man, MD;  Location: [place] [facility];  Service: Cardiovascular;  Laterality: N/A;  . INTRAVASCULAR PRESSURE WIRE/FFR STUDY N/A 06/23/2017   Procedure: INTRAVASCULAR PRESSURE WIRE/FFR STUDY;  Surgeon: Leonie Man, MD;  Location: [place] [facility];  Service: Cardiovascular;  Laterality: N/A;  . KNEE ARTHROSCOPY    . LEFT HEART CATH AND CORONARY ANGIOGRAPHY N/A 06/23/2017   Procedure: LEFT HEART CATH AND CORONARY ANGIOGRAPHY;  Surgeon: Leonie Man, MD;  Location: [place] [facility];  Service: Cardiovascular;  Laterality: N/A;  . TENOTOMY ACHILLES TENDON      There were no vitals filed for this visit.  Subjective Assessment  - 05/22/19 1105    Subjective  Saw MD who told him his R shoulder has arthritis in 3 places and gave him an injection. Notices that he is now able to reach with his R shoulder.    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 Chronic denervation in right triceps may be related to underlying right C7 radiculopathy.    Patient Stated Goals  get rid of pain    Currently in Pain?  Yes    Pain Score  7     Pain Location  Shoulder    Pain Orientation  Right    Pain Descriptors / Indicators  Sharp    Pain Type  Chronic pain    Pain Score  6    Pain Location  Elbow    Pain Orientation  Right;Posterior    Pain Descriptors / Indicators  Sharp    Pain Type  Acute pain                       [facility] Adult PT Treatment/Exercise - 05/22/19 0001      Shoulder Exercises: Supine  Protraction  Strengthening;Right;Weights;15 reps    Protraction Weight (lbs)  2    Protraction Limitations  serratus punch    External Rotation  AAROM;Right;10 reps    External Rotation Limitations  with wand; cues to maintain elbows in    Flexion  Strengthening;Right;10 reps    Shoulder Flexion Weight (lbs)  1    Flexion Limitations  cues to maintain thumb up      Shoulder Exercises: Sidelying   External Rotation  Strengthening;Right;10 reps;Weights    External Rotation Weight (lbs)  1,2    External Rotation Limitations  reminder to maintain wrist in neutral and scap squeeze    ABduction  Strengthening;Right;10 reps;Weights    ABduction Weight (lbs)  1    ABduction Limitations  discontonue d/t pain in forearm      Shoulder Exercises: Pulleys   Flexion  3 minutes    Flexion Limitations  to tolerance    Scaption  3 minutes    Scaption Limitations  to tolerance      Shoulder Exercises: Stretch   Other  Shoulder Stretches  R ER stretch in doorway 2x30" to tolerance   better tolerance than AAROM in supine   Other Shoulder Stretches  R IR stretch in doorway 2x20" to tolerance       Manual Therapy   Manual Therapy  Soft tissue mobilization    Manual therapy comments  supine    Soft tissue mobilization  STM to R UT, biceps, lateral deltoid, proximal biceps tendon, and pec- TTP and large soft tissue restriction R biceps and pec    Myofascial Release  manual TPR to R biceps, pec- TTP             PT Education - 05/22/19 1152    Education Details  update to HEP    Person(s) Educated  Patient    Methods  Explanation;Demonstration;Tactile cues;Verbal cues;Handout    Comprehension  Verbalized understanding;Returned demonstration       PT Short Term Goals - 05/03/19 1106      PT SHORT TERM GOAL #1   Title  Patient to be independent with initial HEP.    Time  2    Period  Weeks    Status  Achieved    Target Date  04/24/19        PT Long Term Goals - 05/03/19 1106      PT LONG TERM GOAL #1   Title  Patient to be independent with advanced HEP.    Time  6    Period  Weeks    Status  Partially Met   met for current   Target Date  06/14/19      PT LONG TERM GOAL #2   Title  Patient to demonstrate [facility] and pain-free R shoulder AROM/PROM.    Time  6    Period  Weeks    Status  Partially Met   PROM improved in shoulder abduction, IR, ER and AROM in flexion and abduction   Target Date  06/14/19      PT LONG TERM GOAL #3   Title  Patient to demonstrate >=4+/5 strength in R UE and demonstrate atleast 40lbs in R grip strength.    Time  6    Period  Weeks    Status  Partially Met   met for grip strength; shoulder strength improved in all planes as well as elbow and wrist extension strength   Target Date  06/14/19      PT  LONG TERM GOAL #4   Title  Patient to report 70% improvement in pain levels with overhead reaching.    Time  4    Period  Weeks    Status  Achieved    reporting 70% improvement     PT LONG TERM GOAL #5   Title  Patient to report <3/10 pain in R UE with shaving.    Time  6    Period  Weeks    Status  On-going   reporting 8/10 pain with shaving   Target Date  06/14/19            Plan - 05/22/19 1153    Clinical Impression Statement  Patient arrived to session with report that he received a R shoulder injection on Friday and is noting some benefit from this. MD still advising to avoid racket sports. Patient reporting improvement in ROM with overhead lifting and reaching across body while bathing. Noting some continued difficulty with supine ER ROM. Reviewed this exercise with cues to maintain elbows into torso. Patient reporting pain, and with better tolerance for ER stretch in doorway. Initiated IR stretch in doorway as well as patient reporting difficulty with IR stretch with strap. Patient reported understanding of HEP update with these new stretches. Tolerated manual therapy to R biceps, lateral delt, UT, proximal biceps tendon, and pec- patient with significant soft tissue restriction and tenderness in biceps muscle belly and pec which improved slightly with STM and TPR. No complaints at end of session. Patient progressing well towards goals. Progress slightly limited by concomitant R elbow pain. Patient also requiring frequent reminders to avoid activities that will likely cause a shoulder flare-up.    Comorbidities  OA, LBP, HLD, heart murmur, asthma, DM, CAD, anemia, ADD, achilles tenotomy, L heart cath & angio 06/2017, coronary stent 06/2017, C4-6 fusion    Rehab Potential  Good    PT Treatment/Interventions  ADLs/Self Care Home Management;Cryotherapy;Electrical Stimulation;Moist Heat;Therapeutic exercise;Therapeutic activities;Functional mobility training;Ultrasound;Neuromuscular re-education;Patient/family education;Manual techniques;Vasopneumatic Device;Taping;Energy conservation;Dry needling;Passive range of motion;Iontophoresis  32m/ml Dexamethasone    PT Next Visit Plan  progress postural and R shoulder strengthening activities pt pt. tolerance; progress ROM activities; modalities for pain relief prn    Consulted and Agree with Plan of Care  Patient       Patient will benefit from skilled therapeutic intervention in order to improve the following deficits and impairments:  Hypomobility, Impaired sensation, Decreased activity tolerance, Decreased strength, Increased fascial restricitons, Impaired UE functional use, Pain, Improper body mechanics, Decreased range of motion, Postural dysfunction, Impaired flexibility  Visit Diagnosis: Pain in right arm  Stiffness of right shoulder, not elsewhere classified  Muscle weakness (generalized)  Abnormal posture     Problem List Patient Active Problem List   Diagnosis Date Noted  . Mild persistent asthma/cough variant asthma 04/13/2019  . Allergic rhinitis with a possible nonallergic component 04/13/2019  . Acid reflux 04/13/2019  . Cough 04/13/2019  . Hoarseness 04/13/2019  . Cervical radicular pain 01/10/2019  . Ulnar neuropathy at elbow of right upper extremity 01/03/2019  . Acute bilateral low back pain without sciatica 01/03/2019  . Onychomycosis 05/25/2018  . Palpitations 07/06/2017  . CAD (coronary artery disease) 07/05/2017  . Family history of early CAD 07/05/2017  . Abnormal findings on diagnostic imaging of cardiovascular system 06/23/2017  . Low vitamin D level 04/22/2017  . Pain in right ankle and joints of right foot 10/05/2016  . Hemarthrosis of right elbow 02/03/2016  . Small thenar eminence 02/03/2016  .  Cervical disc disorder with radiculopathy of cervical region 10/07/2015  . Incomplete rotator cuff tear 09/10/2015  . Nonischemic cardiomyopathy ([place]) 05/15/2015  . Fatigue 05/15/2015  . Dyspnea on exertion 05/15/2015  . Decreased cardiac ejection fraction 04/02/2015  . Strain of latissimus dorsi muscle 01/21/2015  . Shoulder bursitis  10/12/2014  . Stenosis of lateral recess of lumbar spine 10/12/2014  . BPPV (benign paroxysmal positional vertigo) 08/14/2014  . Hyperlipidemia 01/19/2014  . Anemia, unspecified 01/19/2014  . ADD (attention deficit disorder) 01/19/2014  . Type 2 diabetes mellitus ([place]) 12/22/2013  . Hypogonadism in male 12/22/2013  . Arthritis of hand, left 12/22/2013     Janene Harvey, PT, DPT 05/22/19 11:59 AM   [facility] [address]  [place], [place], [address] Phone: [phone]   Fax:  [phone]  Name: BRYLON BRENNING MRN: [id number] Date of Birth: 25-Oct-1955

## 2019-05-24 ENCOUNTER — Encounter: Payer: Self-pay | Admitting: Physical Therapy

## 2019-05-24 ENCOUNTER — Ambulatory Visit: Payer: 59 | Admitting: Physical Therapy

## 2019-05-24 ENCOUNTER — Other Ambulatory Visit: Payer: Self-pay

## 2019-05-24 DIAGNOSIS — M6281 Muscle weakness (generalized): Secondary | ICD-10-CM | POA: Diagnosis not present

## 2019-05-24 DIAGNOSIS — M25611 Stiffness of right shoulder, not elsewhere classified: Secondary | ICD-10-CM | POA: Diagnosis not present

## 2019-05-24 DIAGNOSIS — R293 Abnormal posture: Secondary | ICD-10-CM

## 2019-05-24 DIAGNOSIS — M79601 Pain in right arm: Secondary | ICD-10-CM

## 2019-05-24 NOTE — Therapy (Signed)
[facility] [address]  [place], [place], [address] Phone: [phone]   Fax:  [phone]  Physical Therapy Treatment  Patient Details  Name: Reginald Rios MRN: [id number] Date of Birth: [birth year] Referring Provider (PT): Crosby Oyster [place], [place]   Encounter Date: 05/24/2019  PT End of Session - 05/24/19 1148    Visit Number  12    Number of Visits  20    Date for PT Re-Evaluation  06/14/19    Authorization Type  [facility]    PT Start Time  1100    PT Stop Time  1153   moist heat   PT Time Calculation (min)  53 min    Activity Tolerance  Patient tolerated treatment well    Behavior During Therapy  [facility] for tasks assessed/performed       Past Medical History:  Diagnosis Date  . ADD (attention deficit disorder)   . Anemia   . Arthritis   . CAD (coronary artery disease)    2/19 PCI/DESx1 to Lcx  . Diabetes mellitus without complication ([place])   . Exercise-induced asthma   . Heart murmur   . Hyperlipidemia    family hx of high cholesterol  . Hypogonadism in male   . Low back pain   . Osteoarthritis     Past Surgical History:  Procedure Laterality Date  . CORONARY STENT INTERVENTION N/A 06/23/2017   Procedure: CORONARY STENT INTERVENTION;  Surgeon: Leonie Man, MD;  Location: [place] [facility];  Service: Cardiovascular;  Laterality: N/A;  . INTRAVASCULAR PRESSURE WIRE/FFR STUDY N/A 06/23/2017   Procedure: INTRAVASCULAR PRESSURE WIRE/FFR STUDY;  Surgeon: Leonie Man, MD;  Location: [place] [facility];  Service: Cardiovascular;  Laterality: N/A;  . KNEE ARTHROSCOPY    . LEFT HEART CATH AND CORONARY ANGIOGRAPHY N/A 06/23/2017   Procedure: LEFT HEART CATH AND CORONARY ANGIOGRAPHY;  Surgeon: Leonie Man, MD;  Location: [place] [facility];  Service: Cardiovascular;  Laterality: N/A;  . TENOTOMY ACHILLES TENDON      There were no vitals filed for this visit.  Subjective Assessment - 05/24/19  1102    Subjective  Having some work done on his house coming up. Shoulder feels pretty good, but having a lot of pain going down into his hand today. Was unable to open a drawer.    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 Chronic denervation in right triceps may be related to underlying right C7 radiculopathy.    Patient Stated Goals  get rid of pain    Currently in Pain?  Yes    Pain Score  5     Pain Location  Shoulder    Pain Orientation  Right;Anterior    Pain Descriptors / Indicators  Dull;Aching    Pain Score  9    Pain Location  Elbow    Pain Orientation  Posterior;Right    Pain Descriptors / Indicators  Sharp    Pain Type  Acute pain    Pain Radiating Towards  down dorsal forearm                       [facility] Adult PT Treatment/Exercise - 05/24/19 0001      Shoulder Exercises:  Seated   Flexion  Strengthening;Right;10 reps;Weights    Flexion Weight (lbs)  1    Flexion Limitations  thumb up; raising to tolerance   report of nonpainful pop in R anterior shoulder   Other Seated Exercises  R shoulder scaption with 1# x10   to tolerable range     Shoulder Exercises: Standing   External Rotation  Strengthening;Right;10 reps;Theraband    Theraband Level (Shoulder External Rotation)  Level 1 (Yellow)    External Rotation Limitations  cues to stop at neutral    Internal Rotation  Strengthening;Right;10 reps;Theraband    Internal Rotation Limitations  cues to stop at neutral   pain in R elbow but able to continue   Extension  Strengthening;Both;10 reps;Theraband    Theraband Level (Shoulder Extension)  Level 2 (Red)    Extension Limitations  VC's/TC's for shoulder blade squeeze    Row  Strengthening;Both;10 reps;Theraband    Theraband Level (Shoulder Row)   Level 2 (Red)    Row Limitations  VC's/TC's for shoulder blade squeeze    Other Standing Exercises  R shoulder extension AAROM with cane 10x3" to tolerance   cues to stand tall and relax R shoulder down to avoid pain     Shoulder Exercises: Stretch   Other Shoulder Stretches  R ER stretch in doorway 2x30" to tolerance   corrective cues for form   Other Shoulder Stretches  R IR stretch in doorway 2x20" to tolerance    corrective cues for form     Moist Heat Therapy   Number Minutes Moist Heat  10 Minutes    Moist Heat Location  Cervical      Neck Exercises: Stretches   Upper Trapezius Stretch  Right;2 reps;30 seconds    Upper Trapezius Stretch Limitations  with strap assistance    Levator Stretch  Right;2 reps;30 seconds    Levator Stretch Limitations  with strap assistance             PT Education - 05/24/19 1148    Education Details  update to HEP    Person(s) Educated  Patient    Methods  Explanation;Demonstration;Tactile cues;Verbal cues;Handout    Comprehension  Verbalized understanding;Returned demonstration       PT Short Term Goals - 05/03/19 1106      PT SHORT TERM GOAL #1   Title  Patient to be independent with initial HEP.    Time  2    Period  Weeks    Status  Achieved    Target Date  04/24/19        PT Long Term Goals - 05/03/19 1106      PT LONG TERM GOAL #1   Title  Patient to be independent with advanced HEP.    Time  6    Period  Weeks    Status  Partially Met   met for current   Target Date  06/14/19      PT LONG TERM GOAL #2   Title  Patient to demonstrate [facility] and pain-free R shoulder AROM/PROM.    Time  6    Period  Weeks    Status  Partially Met   PROM improved in shoulder abduction, IR, ER and AROM in flexion and abduction   Target Date  06/14/19      PT LONG TERM GOAL #3   Title  Patient to demonstrate >=4+/5 strength in R UE and demonstrate atleast 40lbs in R grip strength.    Time  6  Period  Weeks    Status  Partially  Met   met for grip strength; shoulder strength improved in all planes as well as elbow and wrist extension strength   Target Date  06/14/19      PT LONG TERM GOAL #4   Title  Patient to report 70% improvement in pain levels with overhead reaching.    Time  4    Period  Weeks    Status  Achieved   reporting 70% improvement     PT LONG TERM GOAL #5   Title  Patient to report <3/10 pain in R UE with shaving.    Time  6    Period  Weeks    Status  On-going   reporting 8/10 pain with shaving   Target Date  06/14/19            Plan - 05/24/19 1149    Clinical Impression Statement  Patient reporting difficulty opening a drawer today d/t R elbow pain. Noting less pain in the shoulder. Initiated sitting R shoulder flexion and scaption with light weighted resistance. Patient with report of nonpainful popping in R anterior shoulder but able to continue. Initiated extension AAROM with patient requiring cues to depress right shoulder, with report of relief after correction according to cues. Reviewed cervical stretches for relief of neck pain, with patient reporting full resolution of pain after stretches. Updated these exercises into HEP as they were well-tolerated today. Ended session with periscapular and RTC strengthening with light banded resistance with intermittent cues to correct posture. Patient reporting slight increase in R shoulder pain after these exercises, thus ended session with moist heat to neck for pain relief. Patient without further complaints at end of session and is demonstrating improvement in exercise tolerance.    Comorbidities  OA, LBP, HLD, heart murmur, asthma, DM, CAD, anemia, ADD, achilles tenotomy, L heart cath & angio 06/2017, coronary stent 06/2017, C4-6 fusion    Rehab Potential  Good    PT Treatment/Interventions  ADLs/Self Care Home Management;Cryotherapy;Electrical Stimulation;Moist Heat;Therapeutic exercise;Therapeutic activities;Functional mobility  training;Ultrasound;Neuromuscular re-education;Patient/family education;Manual techniques;Vasopneumatic Device;Taping;Energy conservation;Dry needling;Passive range of motion;Iontophoresis 70m/ml Dexamethasone    PT Next Visit Plan  progress postural and R shoulder strengthening activities pt pt. tolerance; progress ROM activities; modalities for pain relief prn    Consulted and Agree with Plan of Care  Patient       Patient will benefit from skilled therapeutic intervention in order to improve the following deficits and impairments:  Hypomobility, Impaired sensation, Decreased activity tolerance, Decreased strength, Increased fascial restricitons, Impaired UE functional use, Pain, Improper body mechanics, Decreased range of motion, Postural dysfunction, Impaired flexibility  Visit Diagnosis: Pain in right arm  Stiffness of right shoulder, not elsewhere classified  Muscle weakness (generalized)  Abnormal posture     Problem List Patient Active Problem List   Diagnosis Date Noted  . Mild persistent asthma/cough variant asthma 04/13/2019  . Allergic rhinitis with a possible nonallergic component 04/13/2019  . Acid reflux 04/13/2019  . Cough 04/13/2019  . Hoarseness 04/13/2019  . Cervical radicular pain 01/10/2019  . Ulnar neuropathy at elbow of right upper extremity 01/03/2019  . Acute bilateral low back pain without sciatica 01/03/2019  . Onychomycosis 05/25/2018  . Palpitations 07/06/2017  . CAD (coronary artery disease) 07/05/2017  . Family history of early CAD 07/05/2017  . Abnormal findings on diagnostic imaging of cardiovascular system 06/23/2017  . Low vitamin D level 04/22/2017  . Pain in right ankle and joints of  right foot 10/05/2016  . Hemarthrosis of right elbow 02/03/2016  . Small thenar eminence 02/03/2016  . Cervical disc disorder with radiculopathy of cervical region 10/07/2015  . Incomplete rotator cuff tear 09/10/2015  . Nonischemic cardiomyopathy ([place])  05/15/2015  . Fatigue 05/15/2015  . Dyspnea on exertion 05/15/2015  . Decreased cardiac ejection fraction 04/02/2015  . Strain of latissimus dorsi muscle 01/21/2015  . Shoulder bursitis 10/12/2014  . Stenosis of lateral recess of lumbar spine 10/12/2014  . BPPV (benign paroxysmal positional vertigo) 08/14/2014  . Hyperlipidemia 01/19/2014  . Anemia, unspecified 01/19/2014  . ADD (attention deficit disorder) 01/19/2014  . Type 2 diabetes mellitus ([place]) 12/22/2013  . Hypogonadism in male 12/22/2013  . Arthritis of hand, left 12/22/2013     Janene Harvey, PT, DPT 05/24/19 11:53 AM   [facility] [address]  [place], [place], [address] Phone: [phone]   Fax:  [phone]  Name: Reginald Rios MRN: [id number] Date of Birth: [birth year]

## 2019-05-25 ENCOUNTER — Ambulatory Visit: Payer: 59 | Admitting: Physical Medicine & Rehabilitation

## 2019-05-25 MED FILL — CLOPIDOGREL 75 MG TABLET: 75 | 90 days supply | Qty: 90 | Fill #1

## 2019-05-26 ENCOUNTER — Other Ambulatory Visit: Payer: Self-pay | Admitting: Family Medicine

## 2019-05-26 MED ORDER — FREESTYLE LANCETS MISC: 3 refills | Status: DC

## 2019-05-26 MED ORDER — FREESTYLE FREEDOM LITE W/DEVICE KIT: PACK | 0 refills | Status: AC

## 2019-05-26 MED ORDER — FREESTYLE LITE TEST VI STRP: ORAL_STRIP | 3 refills | Status: DC

## 2019-05-26 MED FILL — FREESTYLE LANCETS: 90 days supply | Qty: 100 | Fill #0

## 2019-05-26 MED FILL — FREESTYLE LITE TEST STRIP: 90 days supply | Qty: 100 | Fill #0

## 2019-05-26 MED FILL — FREESTYLE LITE METER: 1 days supply | Qty: 1 | Fill #0

## 2019-05-26 MED FILL — METHYLPHENIDATE HCL 20 MG T: 20 | 30 days supply | Qty: 30 | Fill #0

## 2019-05-29 ENCOUNTER — Other Ambulatory Visit: Payer: Self-pay

## 2019-05-29 ENCOUNTER — Ambulatory Visit: Payer: 59 | Admitting: Physical Therapy

## 2019-05-29 ENCOUNTER — Encounter: Payer: Self-pay | Admitting: Physical Therapy

## 2019-05-29 DIAGNOSIS — R293 Abnormal posture: Secondary | ICD-10-CM

## 2019-05-29 DIAGNOSIS — M25611 Stiffness of right shoulder, not elsewhere classified: Secondary | ICD-10-CM | POA: Diagnosis not present

## 2019-05-29 DIAGNOSIS — M79601 Pain in right arm: Secondary | ICD-10-CM

## 2019-05-29 DIAGNOSIS — M6281 Muscle weakness (generalized): Secondary | ICD-10-CM

## 2019-05-29 NOTE — Therapy (Signed)
[facility] [address]  [place], [place], [address] Phone: [phone]   Fax:  [phone]  Physical Therapy Treatment  Patient Details  Name: Reginald Rios MRN: [id number] Date of Birth: 04/03/1956 Referring Provider (PT): Crosby Oyster [place], [place]   Encounter Date: 05/29/2019  PT End of Session - 05/29/19 1144    Visit Number  13    Number of Visits  20    Date for PT Re-Evaluation  06/14/19    Authorization Type  [facility]    PT Start Time  1104    PT Stop Time  1152   moist heat   PT Time Calculation (min)  48 min    Activity Tolerance  Patient tolerated treatment well;Patient limited by pain    Behavior During Therapy  [facility] for tasks assessed/performed       Past Medical History:  Diagnosis Date  . ADD (attention deficit disorder)   . Anemia   . Arthritis   . CAD (coronary artery disease)    2/19 PCI/DESx1 to Lcx  . Diabetes mellitus without complication ([place])   . Exercise-induced asthma   . Heart murmur   . Hyperlipidemia    family hx of high cholesterol  . Hypogonadism in male   . Low back pain   . Osteoarthritis     Past Surgical History:  Procedure Laterality Date  . CORONARY STENT INTERVENTION N/A 06/23/2017   Procedure: CORONARY STENT INTERVENTION;  Surgeon: Leonie Man, MD;  Location: [facility];  Service: Cardiovascular;  Laterality: N/A;  . INTRAVASCULAR PRESSURE WIRE/FFR STUDY N/A 06/23/2017   Procedure: INTRAVASCULAR PRESSURE WIRE/FFR STUDY;  Surgeon: Leonie Man, MD;  Location: [facility];  Service: Cardiovascular;  Laterality: N/A;  . KNEE ARTHROSCOPY    . LEFT HEART CATH AND CORONARY ANGIOGRAPHY N/A 06/23/2017   Procedure: LEFT HEART CATH AND CORONARY ANGIOGRAPHY;  Surgeon: Leonie Man, MD;  Location: [facility];  Service: Cardiovascular;  Laterality: N/A;  . TENOTOMY ACHILLES TENDON      There were no vitals filed for this  visit.  Subjective Assessment - 05/29/19 1104    Subjective  Build a kayak rack yesterday and felt a little achey. Performed HEP which increased his pain. MD told him that he has tennis elbow.    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 Chronic denervation in right triceps may be related to underlying right C7 radiculopathy.    Patient Stated Goals  get rid of pain    Currently in Pain?  Yes    Pain Score  6     Pain Location  Shoulder    Pain Orientation  Right    Pain Descriptors / Indicators  Dull;Aching    Pain Type  Chronic pain    Pain Score  8    Pain Location  Elbow    Pain Orientation  Right    Pain Descriptors / Indicators  Sharp    Pain Type  Acute pain    Pain Radiating Towards  into forearm                       [facility] Adult PT Treatment/Exercise - 05/29/19 0001  Shoulder Exercises: Supine   Horizontal ABduction  Strengthening;Both;10 reps;Theraband    Theraband Level (Shoulder Horizontal ABduction)  Level 1 (Yellow)    Horizontal ABduction Limitations  cues for scap retraction    External Rotation  Strengthening;Both;10 reps;Theraband    Theraband Level (Shoulder External Rotation)  Level 1 (Yellow)    External Rotation Limitations  cues for scap retraction      Shoulder Exercises: Standing   Row  Strengthening;Both;10 reps;Theraband    Theraband Level (Shoulder Row)  Level 2 (Red)    Row Limitations  2x10; cues to stop at neutral to avoid shoulder pain    Other Standing Exercises  R shoulder extension AAROM with cane 10x3" to tolerance   c/o R elbow and shoulder pain     Shoulder Exercises: Pulleys   Flexion  3 minutes    Flexion Limitations  to tolerance    Scaption  3 minutes    Scaption Limitations  to tolerance      Shoulder  Exercises: Stretch   Other Shoulder Stretches  R ER stretch in doorway 2x30" to tolerance      Moist Heat Therapy   Number Minutes Moist Heat  10 Minutes    Moist Heat Location  Cervical      Manual Therapy   Manual Therapy  Muscle Energy Technique;Passive ROM    Manual therapy comments  supine    Passive ROM  R shoulder PROM in all directions to tolerance   most pain with end range IR and ER   Muscle Energy Technique  R shoulder IR/ER MET 5x5" followed by ER/IR stretch 5x10"   report of decreased pain              PT Short Term Goals - 05/03/19 1106      PT SHORT TERM GOAL #1   Title  Patient to be independent with initial HEP.    Time  2    Period  Weeks    Status  Achieved    Target Date  04/24/19        PT Long Term Goals - 05/03/19 1106      PT LONG TERM GOAL #1   Title  Patient to be independent with advanced HEP.    Time  6    Period  Weeks    Status  Partially Met   met for current   Target Date  06/14/19      PT LONG TERM GOAL #2   Title  Patient to demonstrate [facility] and pain-free R shoulder AROM/PROM.    Time  6    Period  Weeks    Status  Partially Met   PROM improved in shoulder abduction, IR, ER and AROM in flexion and abduction   Target Date  06/14/19      PT LONG TERM GOAL #3   Title  Patient to demonstrate >=4+/5 strength in R UE and demonstrate atleast 40lbs in R grip strength.    Time  6    Period  Weeks    Status  Partially Met   met for grip strength; shoulder strength improved in all planes as well as elbow and wrist extension strength   Target Date  06/14/19      PT LONG TERM GOAL #4   Title  Patient to report 70% improvement in pain levels with overhead reaching.    Time  4    Period  Weeks    Status  Achieved   reporting 70% improvement  PT LONG TERM GOAL #5   Title  Patient to report <3/10 pain in R UE with shaving.    Time  6    Period  Weeks    Status  On-going   reporting 8/10 pain with shaving   Target Date   06/14/19            Plan - 05/29/19 1145    Clinical Impression Statement  Patient reporting that he built a kayak rack yesterday, "which required a lot of wrist motion." Felt that he tolerated this okay but had an increase in pain after performing his HEP. Reports that his MD advised him that he has tennis elbow. Patient tolerated R shoulder PROM and MET today for improvement in ROM with focus on IR and ER. Patient reported improvement in pain levels after manual therapy. Continued with gentle supine periscapular strengthening with intermittent cues for form. Most difficulty demonstrated with banded ER. Reviewed shoulder ER stretch for better understanding- patient reported understanding. Reported mild "ache" in R shoulder and R side of neck at end of session which was addressed with moist heat. No further complaints at end of session. D/t patient's improvement in pain levels with manual therapy, advised patient to continue with HEP but avoid activities requiring heavy lifting or repetitive wrist/elbow/shoulder motion as patient's recent building project likely contributed to increased pain. Patient reported understanding.    Comorbidities  OA, LBP, HLD, heart murmur, asthma, DM, CAD, anemia, ADD, achilles tenotomy, L heart cath & angio 06/2017, coronary stent 06/2017, C4-6 fusion    Rehab Potential  Good    PT Treatment/Interventions  ADLs/Self Care Home Management;Cryotherapy;Electrical Stimulation;Moist Heat;Therapeutic exercise;Therapeutic activities;Functional mobility training;Ultrasound;Neuromuscular re-education;Patient/family education;Manual techniques;Vasopneumatic Device;Taping;Energy conservation;Dry needling;Passive range of motion;Iontophoresis 56m/ml Dexamethasone    PT Next Visit Plan  progress postural and R shoulder strengthening activities pt pt. tolerance; progress ROM activities; modalities for pain relief prn    Consulted and Agree with Plan of Care  Patient       Patient  will benefit from skilled therapeutic intervention in order to improve the following deficits and impairments:  Hypomobility, Impaired sensation, Decreased activity tolerance, Decreased strength, Increased fascial restricitons, Impaired UE functional use, Pain, Improper body mechanics, Decreased range of motion, Postural dysfunction, Impaired flexibility  Visit Diagnosis: Pain in right arm  Stiffness of right shoulder, not elsewhere classified  Muscle weakness (generalized)  Abnormal posture     Problem List Patient Active Problem List   Diagnosis Date Noted  . Mild persistent asthma/cough variant asthma 04/13/2019  . Allergic rhinitis with a possible nonallergic component 04/13/2019  . Acid reflux 04/13/2019  . Cough 04/13/2019  . Hoarseness 04/13/2019  . Cervical radicular pain 01/10/2019  . Ulnar neuropathy at elbow of right upper extremity 01/03/2019  . Acute bilateral low back pain without sciatica 01/03/2019  . Onychomycosis 05/25/2018  . Palpitations 07/06/2017  . CAD (coronary artery disease) 07/05/2017  . Family history of early CAD 07/05/2017  . Abnormal findings on diagnostic imaging of cardiovascular system 06/23/2017  . Low vitamin D level 04/22/2017  . Pain in right ankle and joints of right foot 10/05/2016  . Hemarthrosis of right elbow 02/03/2016  . Small thenar eminence 02/03/2016  . Cervical disc disorder with radiculopathy of cervical region 10/07/2015  . Incomplete rotator cuff tear 09/10/2015  . [place]ischemic cardiomyopathy ([place] 05/15/2015  . Fatigue 05/15/2015  . Dyspnea on exertion 05/15/2015  . Decreased cardiac ejection fraction 04/02/2015  . Strain of latissimus dorsi muscle 01/21/2015  . Shoulder  bursitis 10/12/2014  . Stenosis of lateral recess of lumbar spine 10/12/2014  . BPPV (benign paroxysmal positional vertigo) 08/14/2014  . Hyperlipidemia 01/19/2014  . Anemia, unspecified 01/19/2014  . ADD (attention deficit disorder) 01/19/2014  . Type  2 diabetes mellitus ([place]) 12/22/2013  . Hypogonadism in male 12/22/2013  . Arthritis of hand, left 12/22/2013     Janene Harvey, PT, DPT 05/29/19 11:59 AM   [facility] [address]  [place], [place], [address] Phone: [phone]   Fax:  [phone]  Name: Reginald Rios MRN: [id number] Date of Birth: August 08, 1955

## 2019-05-30 DIAGNOSIS — M542 Cervicalgia: Secondary | ICD-10-CM | POA: Diagnosis not present

## 2019-05-31 ENCOUNTER — Encounter: Payer: Self-pay | Admitting: Physical Therapy

## 2019-05-31 ENCOUNTER — Other Ambulatory Visit: Payer: Self-pay

## 2019-05-31 ENCOUNTER — Other Ambulatory Visit: Payer: Self-pay | Admitting: Cardiology

## 2019-05-31 ENCOUNTER — Ambulatory Visit: Payer: 59 | Admitting: Physical Therapy

## 2019-05-31 DIAGNOSIS — R293 Abnormal posture: Secondary | ICD-10-CM

## 2019-05-31 DIAGNOSIS — M25611 Stiffness of right shoulder, not elsewhere classified: Secondary | ICD-10-CM | POA: Diagnosis not present

## 2019-05-31 DIAGNOSIS — M6281 Muscle weakness (generalized): Secondary | ICD-10-CM | POA: Diagnosis not present

## 2019-05-31 DIAGNOSIS — M79601 Pain in right arm: Secondary | ICD-10-CM | POA: Diagnosis not present

## 2019-05-31 MED FILL — EZETIMIBE 10 MG TABS: 10 | 90 days supply | Qty: 90 | Fill #0

## 2019-05-31 MED FILL — LOSARTAN POTASSIUM 25 MG TA: 25 | 60 days supply | Qty: 60 | Fill #1

## 2019-05-31 NOTE — Therapy (Signed)
[facility] [address]  [place], [place], [address] Phone: [phone]   Fax:  [phone]  Physical Therapy Treatment  Patient Details  Name: Reginald Rios MRN: [id number] Date of Birth: [birth year] Referring Provider (PT): Crosby Oyster [place], [place]   Encounter Date: 05/31/2019  PT End of Session - 05/31/19 1145    Visit Number  14    Number of Visits  20    Date for PT Re-Evaluation  06/14/19    Authorization Type  [facility]    PT Start Time  1105    PT Stop Time  1158    PT Time Calculation (min)  53 min    Activity Tolerance  Patient tolerated treatment well;Patient limited by pain    Behavior During Therapy  [facility] for tasks assessed/performed       Past Medical History:  Diagnosis Date  . ADD (attention deficit disorder)   . Anemia   . Arthritis   . CAD (coronary artery disease)    2/19 PCI/DESx1 to Lcx  . Diabetes mellitus without complication ([place])   . Exercise-induced asthma   . Heart murmur   . Hyperlipidemia    family hx of high cholesterol  . Hypogonadism in male   . Low back pain   . Osteoarthritis     Past Surgical History:  Procedure Laterality Date  . CORONARY STENT INTERVENTION N/A 06/23/2017   Procedure: CORONARY STENT INTERVENTION;  Surgeon: Leonie Man, MD;  Location: [facility];  Service: Cardiovascular;  Laterality: N/A;  . INTRAVASCULAR PRESSURE WIRE/FFR STUDY N/A 06/23/2017   Procedure: INTRAVASCULAR PRESSURE WIRE/FFR STUDY;  Surgeon: Leonie Man, MD;  Location: [facility];  Service: Cardiovascular;  Laterality: N/A;  . KNEE ARTHROSCOPY    . LEFT HEART CATH AND CORONARY ANGIOGRAPHY N/A 06/23/2017   Procedure: LEFT HEART CATH AND CORONARY ANGIOGRAPHY;  Surgeon: Leonie Man, MD;  Location: [facility];  Service: Cardiovascular;  Laterality: N/A;  . TENOTOMY ACHILLES TENDON      There were no vitals filed for this visit.  Subjective Assessment  - 05/31/19 1106    Subjective  Performed HEP last night and noticed an increase in pain in R lateral arm and elbow. Possibly d/t supine shoulder abduction AAROM and IR stretch in doorway.    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 Chronic denervation in right triceps may be related to underlying right C7 radiculopathy.    Patient Stated Goals  get rid of pain    Currently in Pain?  Yes    Pain Score  8     Pain Location  Shoulder    Pain Orientation  Right    Pain Descriptors / Indicators  Aching;Dull    Pain Type  Chronic pain    Pain Score  9    Pain Location  Elbow    Pain Orientation  Right    Pain Descriptors / Indicators  Sharp    Pain Type  Acute pain                       [facility] Adult PT Treatment/Exercise - 05/31/19 0001      Shoulder Exercises: Supine   ABduction  AAROM;Right;10  reps    ABduction Limitations  with wand; to tolerance; adjusted grip onto SPC handle to improve elbow pain      Shoulder Exercises: Pulleys   Flexion  3 minutes    Flexion Limitations  to tolerance    Scaption  3 minutes    Scaption Limitations  to tolerance      Shoulder Exercises: Stretch   Other Shoulder Stretches  R ER stretch in doorway 30" to tolerance   no pain   Other Shoulder Stretches  R IR stretch in doorway 30" to tolerance    no pain     Moist Heat Therapy   Number Minutes Moist Heat  15 Minutes    Moist Heat Location  Shoulder      Electrical Stimulation   Electrical Stimulation Location  R shoulder complex     Electrical Stimulation Action  IFC    Electrical Stimulation Parameters  80-150hx; intensity to tolerance; 15 min    Electrical Stimulation Goals  Pain;Tone      Manual Therapy   Manual Therapy  Passive ROM    Manual therapy comments  supine     Soft tissue mobilization  STM to R proximal biceps and triceps muscle bellies, pec- considerable TTP and large palpable soft tissue restriction in biceps and triceps    Myofascial Release  manual TPR to R biceps, triceps, pec- TTP and palpable trigger point evident in R biceps/triceps    Passive ROM  R shoulder PROM in all directions to tolerance               PT Short Term Goals - 05/03/19 1106      PT SHORT TERM GOAL #1   Title  Patient to be independent with initial HEP.    Time  2    Period  Weeks    Status  Achieved    Target Date  04/24/19        PT Long Term Goals - 05/03/19 1106      PT LONG TERM GOAL #1   Title  Patient to be independent with advanced HEP.    Time  6    Period  Weeks    Status  Partially Met   met for current   Target Date  06/14/19      PT LONG TERM GOAL #2   Title  Patient to demonstrate [facility] and pain-free R shoulder AROM/PROM.    Time  6    Period  Weeks    Status  Partially Met   PROM improved in shoulder abduction, IR, ER and AROM in flexion and abduction   Target Date  06/14/19      PT LONG TERM GOAL #3   Title  Patient to demonstrate >=4+/5 strength in R UE and demonstrate atleast 40lbs in R grip strength.    Time  6    Period  Weeks    Status  Partially Met   met for grip strength; shoulder strength improved in all planes as well as elbow and wrist extension strength   Target Date  06/14/19      PT LONG TERM GOAL #4   Title  Patient to report 70% improvement in pain levels with overhead reaching.    Time  4    Period  Weeks    Status  Achieved   reporting 70% improvement     PT LONG TERM GOAL #5   Title  Patient to report <3/10 pain in R UE with  shaving.    Time  6    Period  Weeks    Status  On-going   reporting 8/10 pain with shaving   Target Date  06/14/19            Plan - 05/31/19 1145    Clinical Impression Statement  Patient reporting flare up of R upper arm and elbow pain from performing HEP last  session. Noting that shoulder abduction AAROM and IR stretch in doorway may have been causative factors. D/t patient's intensity of pain today, focused session on manual therapy and review of HEP for improvement in pain levels. Patient tolerated R shoulder PROM with report of "soreness" during stretching, but with report of relief of shoulder pain afterwards. Demonstrated soft tissue restriction and tender trigger points in R biceps and triceps muscle bellies, with good improvement in pain levels and soft tissue restriction after manual therapy. Reviewed shoulder abduction AAROM with patient reporting R elbow pain which resolved after adjustment in grip. Reported no pain with IR or ER stretch in doorway. Ended session with ice pack and e-stim to R shoulder for pain relief. Patient reported continued improvement in pain end of session. No complaints at end of session.    Comorbidities  OA, LBP, HLD, heart murmur, asthma, DM, CAD, anemia, ADD, achilles tenotomy, L heart cath & angio 06/2017, coronary stent 06/2017, C4-6 fusion    Rehab Potential  Good    PT Treatment/Interventions  ADLs/Self Care Home Management;Cryotherapy;Electrical Stimulation;Moist Heat;Therapeutic exercise;Therapeutic activities;Functional mobility training;Ultrasound;Neuromuscular re-education;Patient/family education;Manual techniques;Vasopneumatic Device;Taping;Energy conservation;Dry needling;Passive range of motion;Iontophoresis '4mg'$ /ml Dexamethasone    PT Next Visit Plan  progress postural and R shoulder strengthening activities pt pt. tolerance; progress ROM activities; modalities for pain relief prn    Consulted and Agree with Plan of Care  Patient       Patient will benefit from skilled therapeutic intervention in order to improve the following deficits and impairments:  Hypomobility, Impaired sensation, Decreased activity tolerance, Decreased strength, Increased fascial restricitons, Impaired UE functional use, Pain, Improper  body mechanics, Decreased range of motion, Postural dysfunction, Impaired flexibility  Visit Diagnosis: Pain in right arm  Stiffness of right shoulder, not elsewhere classified  Muscle weakness (generalized)  Abnormal posture     Problem List Patient Active Problem List   Diagnosis Date Noted  . Mild persistent asthma/cough variant asthma 04/13/2019  . Allergic rhinitis with a possible nonallergic component 04/13/2019  . Acid reflux 04/13/2019  . Cough 04/13/2019  . Hoarseness 04/13/2019  . Cervical radicular pain 01/10/2019  . Ulnar neuropathy at elbow of right upper extremity 01/03/2019  . Acute bilateral low back pain without sciatica 01/03/2019  . Onychomycosis 05/25/2018  . Palpitations 07/06/2017  . CAD (coronary artery disease) 07/05/2017  . Family history of early CAD 07/05/2017  . Abnormal findings on diagnostic imaging of cardiovascular system 06/23/2017  . Low vitamin D level 04/22/2017  . Pain in right ankle and joints of right foot 10/05/2016  . Hemarthrosis of right elbow 02/03/2016  . Small thenar eminence 02/03/2016  . Cervical disc disorder with radiculopathy of cervical region 10/07/2015  . Incomplete rotator cuff tear 09/10/2015  . Nonischemic cardiomyopathy ([place]) 05/15/2015  . Fatigue 05/15/2015  . Dyspnea on exertion 05/15/2015  . Decreased cardiac ejection fraction 04/02/2015  . Strain of latissimus dorsi muscle 01/21/2015  . Shoulder bursitis 10/12/2014  . Stenosis of lateral recess of lumbar spine 10/12/2014  . BPPV (benign paroxysmal positional vertigo) 08/14/2014  . Hyperlipidemia 01/19/2014  . Anemia, unspecified  01/19/2014  . ADD (attention deficit disorder) 01/19/2014  . Type 2 diabetes mellitus ([place]) 12/22/2013  . Hypogonadism in male 12/22/2013  . Arthritis of hand, left 12/22/2013    Janene Harvey, PT, DPT 05/31/19 12:14 PM   [facility] [address]  [place], [place], [address] Phone: [phone]   Fax:  [phone]  Name: Reginald Rios MRN: [id number] Date of Birth: 1955/09/26

## 2019-06-01 ENCOUNTER — Ambulatory Visit: Payer: 59

## 2019-06-05 ENCOUNTER — Other Ambulatory Visit: Payer: Self-pay

## 2019-06-05 ENCOUNTER — Encounter: Payer: Self-pay | Admitting: Physical Therapy

## 2019-06-05 ENCOUNTER — Ambulatory Visit: Payer: 59 | Attending: Family Medicine | Admitting: Physical Therapy

## 2019-06-05 DIAGNOSIS — M25611 Stiffness of right shoulder, not elsewhere classified: Secondary | ICD-10-CM | POA: Insufficient documentation

## 2019-06-05 DIAGNOSIS — R293 Abnormal posture: Secondary | ICD-10-CM | POA: Diagnosis not present

## 2019-06-05 DIAGNOSIS — M79601 Pain in right arm: Secondary | ICD-10-CM | POA: Insufficient documentation

## 2019-06-05 DIAGNOSIS — M6281 Muscle weakness (generalized): Secondary | ICD-10-CM | POA: Diagnosis not present

## 2019-06-05 NOTE — Therapy (Signed)
[facility] [address]  [place], [place], [address] Phone: [phone]   Fax:  [phone]  Physical Therapy Treatment  Patient Details  Name: Reginald Rios MRN: [id number] Date of Birth: [birth year] Referring Provider (PT): Crosby Oyster [place], [place]   Encounter Date: 06/05/2019  PT End of Session - 06/05/19 1620    Visit Number  15    Number of Visits  20    Date for PT Re-Evaluation  06/14/19    Authorization Type  [facility]    PT Start Time  1530    PT Stop Time  1631    PT Time Calculation (min)  61 min    Activity Tolerance  Patient tolerated treatment well;Patient limited by pain    Behavior During Therapy  [facility] for tasks assessed/performed       Past Medical History:  Diagnosis Date  . ADD (attention deficit disorder)   . Anemia   . Arthritis   . CAD (coronary artery disease)    2/19 PCI/DESx1 to Lcx  . Diabetes mellitus without complication ([place])   . Exercise-induced asthma   . Heart murmur   . Hyperlipidemia    family hx of high cholesterol  . Hypogonadism in male   . Low back pain   . Osteoarthritis     Past Surgical History:  Procedure Laterality Date  . CORONARY STENT INTERVENTION N/A 06/23/2017   Procedure: CORONARY STENT INTERVENTION;  Surgeon: Leonie Man, MD;  Location: [facility];  Service: Cardiovascular;  Laterality: N/A;  . INTRAVASCULAR PRESSURE WIRE/FFR STUDY N/A 06/23/2017   Procedure: INTRAVASCULAR PRESSURE WIRE/FFR STUDY;  Surgeon: Leonie Man, MD;  Location: [facility];  Service: Cardiovascular;  Laterality: N/A;  . KNEE ARTHROSCOPY    . LEFT HEART CATH AND CORONARY ANGIOGRAPHY N/A 06/23/2017   Procedure: LEFT HEART CATH AND CORONARY ANGIOGRAPHY;  Surgeon: Leonie Man, MD;  Location: [facility];  Service: Cardiovascular;  Laterality: N/A;  . TENOTOMY ACHILLES TENDON      There were no vitals filed for this visit.  Subjective Assessment  - 06/05/19 1532    Subjective  Has just done his exercises- so he is feeling good. Worked all day yesterday replacing the screens in his attic which required a lot of turning a screwdriver. Feels like he is able to tolerate reaching overhead with less pain.    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 Chronic denervation in right triceps may be related to underlying right C7 radiculopathy.    Patient Stated Goals  get rid of pain    Currently in Pain?  Yes    Pain Score  4     Pain Location  Shoulder    Pain Orientation  Right;Anterior    Pain Descriptors / Indicators  Sore    Pain Type  Chronic pain    Pain Score  7    Pain Location  Elbow    Pain Orientation  Right    Pain Descriptors / Indicators  Tingling    Pain Type  Acute pain    Pain Radiating Towards  dorsal forearm  [facility] Adult PT Treatment/Exercise - 06/05/19 0001      Shoulder Exercises: Prone   Flexion  Strengthening;Right;10 reps    Flexion Limitations  prone "I" over orange pball   TC's for R scapular depression   Horizontal ABduction 1  Strengthening;Right;10 reps    Horizontal ABduction 1 Limitations  prone "T" over orange pball    Other Prone Exercises  R prone "Y" orange pball x10   TC's required to promote scapular depression      Shoulder Exercises: Standing   Horizontal ABduction  Both;Strengthening;Theraband    Theraband Level (Shoulder Horizontal ABduction)  Level 1 (Yellow)    Horizontal ABduction Limitations  standing with back along doorframe    External Rotation  Strengthening;Right;10 reps;Theraband    Theraband Level (Shoulder External Rotation)  Level 1 (Yellow)    External Rotation Limitations  standing with back along doorframe      Shoulder Exercises:  Pulleys   Flexion  3 minutes    Flexion Limitations  to tolerance    Scaption  3 minutes    Scaption Limitations  to tolerance      Modalities   Modalities  Cryotherapy      Vasopneumatic   Number Minutes Vasopneumatic   15 minutes    Vasopnuematic Location   Shoulder   R   Vasopneumatic Pressure  Low    Vasopneumatic Temperature   coldest      Manual Therapy   Manual Therapy  Passive ROM;Soft tissue mobilization;Myofascial release    Manual therapy comments  supine    Soft tissue mobilization  STM and IASTM to R biceps muscle belly, pec- TTP and soft tissue restriction    Myofascial Release  manual TPR to R biceps- painful trigger pts evident    Passive ROM  R shoulder PROM in all directions to tolerance               PT Short Term Goals - 05/03/19 1106      PT SHORT TERM GOAL #1   Title  Patient to be independent with initial HEP.    Time  2    Period  Weeks    Status  Achieved    Target Date  04/24/19        PT Long Term Goals - 05/03/19 1106      PT LONG TERM GOAL #1   Title  Patient to be independent with advanced HEP.    Time  6    Period  Weeks    Status  Partially Met   met for current   Target Date  06/14/19      PT LONG TERM GOAL #2   Title  Patient to demonstrate [facility] and pain-free R shoulder AROM/PROM.    Time  6    Period  Weeks    Status  Partially Met   PROM improved in shoulder abduction, IR, ER and AROM in flexion and abduction   Target Date  06/14/19      PT LONG TERM GOAL #3   Title  Patient to demonstrate >=4+/5 strength in R UE and demonstrate atleast 40lbs in R grip strength.    Time  6    Period  Weeks    Status  Partially Met   met for grip strength; shoulder strength improved in all planes as well as elbow and wrist extension strength   Target Date  06/14/19      PT LONG TERM GOAL #4   Title  Patient  to report 70% improvement in pain levels with overhead reaching.    Time  4    Period  Weeks    Status  Achieved    reporting 70% improvement     PT LONG TERM GOAL #5   Title  Patient to report <3/10 pain in R UE with shaving.    Time  6    Period  Weeks    Status  On-going   reporting 8/10 pain with shaving   Target Date  06/14/19            Plan - 06/05/19 1621    Clinical Impression Statement  Patient reporting that he was able to complete a project yesterday which required using a screwdriver without an increase in pain. Patient also noting that he feels like he is able to tolerate reaching overhead with less pain recently. Reporting relief of pain with STM and PROM, thus began with this today. Patient with less soft tissue restriction in R biceps compared to last session, but tender trigger points still remaining. Also tolerated R shoulder PROM to tolerable limits. Patient reported improvement in pain after manual therapy. Worked on periscapular strengthening with patient demonstrating considerable difficulty with prone I, T, Y. Ended session with [place] to R shoulder. No complaints at end of session.    Comorbidities  OA, LBP, HLD, heart murmur, asthma, DM, CAD, anemia, ADD, achilles tenotomy, L heart cath & angio 06/2017, coronary stent 06/2017, C4-6 fusion    Rehab Potential  Good    PT Treatment/Interventions  ADLs/Self Care Home Management;Cryotherapy;Electrical Stimulation;Moist Heat;Therapeutic exercise;Therapeutic activities;Functional mobility training;Ultrasound;Neuromuscular re-education;Patient/family education;Manual techniques;Vasopneumatic Device;Taping;Energy conservation;Dry needling;Passive range of motion;Iontophoresis 35m/ml Dexamethasone    PT Next Visit Plan  progress postural and R shoulder strengthening activities pt pt. tolerance; progress ROM activities; modalities for pain relief prn    Consulted and Agree with Plan of Care  Patient       Patient will benefit from skilled therapeutic intervention in order to improve the following deficits and impairments:   Hypomobility, Impaired sensation, Decreased activity tolerance, Decreased strength, Increased fascial restricitons, Impaired UE functional use, Pain, Improper body mechanics, Decreased range of motion, Postural dysfunction, Impaired flexibility  Visit Diagnosis: Pain in right arm  Stiffness of right shoulder, not elsewhere classified  Muscle weakness (generalized)  Abnormal posture     Problem List Patient Active Problem List   Diagnosis Date Noted  . Mild persistent asthma/cough variant asthma 04/13/2019  . Allergic rhinitis with a possible nonallergic component 04/13/2019  . Acid reflux 04/13/2019  . Cough 04/13/2019  . Hoarseness 04/13/2019  . Cervical radicular pain 01/10/2019  . Ulnar neuropathy at elbow of right upper extremity 01/03/2019  . Acute bilateral low back pain without sciatica 01/03/2019  . Onychomycosis 05/25/2018  . Palpitations 07/06/2017  . CAD (coronary artery disease) 07/05/2017  . Family history of early CAD 07/05/2017  . Abnormal findings on diagnostic imaging of cardiovascular system 06/23/2017  . Low vitamin D level 04/22/2017  . Pain in right ankle and joints of right foot 10/05/2016  . Hemarthrosis of right elbow 02/03/2016  . Small thenar eminence 02/03/2016  . Cervical disc disorder with radiculopathy of cervical region 10/07/2015  . Incomplete rotator cuff tear 09/10/2015  . Nonischemic cardiomyopathy ([place] 05/15/2015  . Fatigue 05/15/2015  . Dyspnea on exertion 05/15/2015  . Decreased cardiac ejection fraction 04/02/2015  . Strain of latissimus dorsi muscle 01/21/2015  . Shoulder bursitis 10/12/2014  . Stenosis of lateral recess of lumbar spine 10/12/2014  .  BPPV (benign paroxysmal positional vertigo) 08/14/2014  . Hyperlipidemia 01/19/2014  . Anemia, unspecified 01/19/2014  . ADD (attention deficit disorder) 01/19/2014  . Type 2 diabetes mellitus ([place]) 12/22/2013  . Hypogonadism in male 12/22/2013  . Arthritis of hand, left 12/22/2013      Janene Harvey, PT, DPT 06/05/19 4:34 PM   [facility] [address]  [place], [place], [address] Phone: [phone]   Fax:  [phone]  Name: HAARIS METALLO MRN: [id number] Date of Birth: July 20, 1955

## 2019-06-06 MED FILL — TESTOSTERONE 50 MG/5GM (1%): 50 MG/5GM | 30 days supply | Qty: 300 | Fill #2

## 2019-06-06 MED FILL — FARXIGA 10 MG TABLET: 10 | 30 days supply | Qty: 30 | Fill #2

## 2019-06-07 ENCOUNTER — Ambulatory Visit: Payer: 59 | Admitting: Physical Therapy

## 2019-06-07 ENCOUNTER — Other Ambulatory Visit: Payer: Self-pay

## 2019-06-07 ENCOUNTER — Encounter: Payer: Self-pay | Admitting: Physical Therapy

## 2019-06-07 DIAGNOSIS — M79601 Pain in right arm: Secondary | ICD-10-CM | POA: Diagnosis not present

## 2019-06-07 DIAGNOSIS — M6281 Muscle weakness (generalized): Secondary | ICD-10-CM

## 2019-06-07 DIAGNOSIS — M25611 Stiffness of right shoulder, not elsewhere classified: Secondary | ICD-10-CM | POA: Diagnosis not present

## 2019-06-07 DIAGNOSIS — R293 Abnormal posture: Secondary | ICD-10-CM | POA: Diagnosis not present

## 2019-06-07 NOTE — Therapy (Signed)
[facility] [address]  [address] [place], [place], [address] Phone: [phone]   Fax:  [phone]  Physical Therapy Treatment  Patient Details  Name: Reginald Rios MRN: [id number] Date of Birth: 08/21/1955 Referring Provider (PT): Nicholas Paul Wendling, DO   Encounter Date: 06/07/2019  PT End of Session - 06/07/19 1412    Visit Number  16    Number of Visits  20    Date for PT Re-Evaluation  06/14/19    Authorization Type  [facility]    PT Start Time  1316    PT Stop Time  1406    PT Time Calculation (min)  50 min    Activity Tolerance  Patient tolerated treatment well    Behavior During Therapy  WFL for tasks assessed/performed       Past Medical History:  Diagnosis Date  . ADD (attention deficit disorder)   . Anemia   . Arthritis   . CAD (coronary artery disease)    2/19 PCI/DESx1 to Lcx  . Diabetes mellitus without complication ([facility])   . Exercise-induced asthma   . Heart murmur   . Hyperlipidemia    family hx of high cholesterol  . Hypogonadism in male   . Low back pain   . Osteoarthritis     Past Surgical History:  Procedure Laterality Date  . CORONARY STENT INTERVENTION N/A 06/23/2017   Procedure: CORONARY STENT INTERVENTION;  Surgeon: Harding, Siddiq W, MD;  Location: [facility];  Service: Cardiovascular;  Laterality: N/A;  . INTRAVASCULAR PRESSURE WIRE/FFR STUDY N/A 06/23/2017   Procedure: INTRAVASCULAR PRESSURE WIRE/FFR STUDY;  Surgeon: Harding, Saba W, MD;  Location: [facility];  Service: Cardiovascular;  Laterality: N/A;  . KNEE ARTHROSCOPY    . LEFT HEART CATH AND CORONARY ANGIOGRAPHY N/A 06/23/2017   Procedure: LEFT HEART CATH AND CORONARY ANGIOGRAPHY;  Surgeon: Harding, Joson W, MD;  Location: [facility];  Service: Cardiovascular;  Laterality: N/A;  . TENOTOMY ACHILLES TENDON      There were no vitals filed for this visit.  Subjective Assessment - 06/07/19 1316    Subjective  Doing pretty good. Asked his MD about trying PT for his R elbow and was instructed to use Voltaren.    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 Chronic denervation in right triceps may be related to underlying right C7 radiculopathy.    Patient Stated Goals  get rid of pain    Currently in Pain?  Yes    Pain Score  6     Pain Location  Shoulder    Pain Orientation  Right;Anterior    Pain Descriptors / Indicators  Sore    Pain Type  Chronic pain    Pain Score  8    Pain Location  Elbow    Pain Orientation  Right    Pain Descriptors / Indicators  Tingling    Pain Type  Acute pain                       [facility] Adult PT Treatment/Exercise - 06/07/19 0001      Shoulder Exercises: Supine   Flexion  Strengthening;Right;10 reps    Shoulder Flexion Weight (lbs)  2      Flexion Limitations  good form   pt reporting good challenge     Shoulder Exercises: Seated   Flexion  Strengthening;Right;10 reps;Weights    Flexion Weight (lbs)  1    Flexion Limitations  thumb up; trying to maintain shoulder down       Shoulder Exercises: Sidelying   External Rotation  Strengthening;Right;10 reps;Weights    External Rotation Weight (lbs)  2    External Rotation Limitations  good ROM and tolerance    ABduction  Strengthening;Right;10 reps;Weights    ABduction Weight (lbs)  1    ABduction Limitations  thumb up; to tolerance      Shoulder Exercises: Pulleys   Flexion  3 minutes    Flexion Limitations  to tolerance    Scaption  3 minutes    Scaption Limitations  to tolerance      Shoulder Exercises: Stretch   Corner Stretch  2 reps;30 seconds    Corner Stretch Limitations  low pec stretch in doorway   cues to back off to avoid pain     Manual Therapy   Manual Therapy   Passive ROM    Manual therapy comments  supine    Joint Mobilization  R shoulder jt mobs in anterior, posterior, inferior directions grade III/IV to tolerance    Passive ROM  R shoulder PROM in all directions to tolerance   most difficulty with IR and ER     Neck Exercises: Stretches   Upper Trapezius Stretch  Right;2 reps;30 seconds    Upper Trapezius Stretch Limitations  with strap assistance    Levator Stretch  Right;2 reps;30 seconds    Levator Stretch Limitations  with strap assistance             PT Education - 06/07/19 1412    Education Details  update and consolidation of HEP    Person(s) Educated  Patient    Methods  Explanation;Demonstration;Tactile cues;Verbal cues;Handout    Comprehension  Verbalized understanding;Returned demonstration       PT Short Term Goals - 05/03/19 1106      PT SHORT TERM GOAL #1   Title  Patient to be independent with initial HEP.    Time  2    Period  Weeks    Status  Achieved    Target Date  04/24/19        PT Long Term Goals - 05/03/19 1106      PT LONG TERM GOAL #1   Title  Patient to be independent with advanced HEP.    Time  6    Period  Weeks    Status  Partially Met   met for current   Target Date  06/14/19      PT LONG TERM GOAL #2   Title  Patient to demonstrate WFL and pain-free R shoulder AROM/PROM.    Time  6    Period  Weeks    Status  Partially Met   PROM improved in shoulder abduction, IR, ER and AROM in flexion and abduction   Target Date  06/14/19      PT LONG TERM GOAL #3   Title  Patient to demonstrate >=4+/5 strength in R UE and demonstrate atleast 40lbs in R grip strength.    Time  6    Period  Weeks    Status  Partially Met   met for grip strength; shoulder strength improved in all planes as well as elbow and wrist extension strength   Target Date    06/14/19      PT LONG TERM GOAL #4   Title  Patient to report 70% improvement in pain levels with overhead reaching.    Time  4    Period   Weeks    Status  Achieved   reporting 70% improvement     PT LONG TERM GOAL #5   Title  Patient to report <3/10 pain in R UE with shaving.    Time  6    Period  Weeks    Status  On-going   reporting 8/10 pain with shaving   Target Date  06/14/19            Plan - 06/07/19 1413    Clinical Impression Statement  Patient without new complaints today. Patient tolerated R shoulder PROM in all directions and joint mobilizations to tolerance for improvement in ROM. Patient reported improvement in both shoulder and elbow ROM after manual therapy. Patient was able to perform supine flexion and sidelying abduction with increased weighted resistance and good tolerance and effort to stretch overhead. Also demonstrating improvement in ROM with sidelying ER today. Patient reported R UT tightness after performing sidelying ther-ex which was relieved by cervical stretching. Updated HEP with exercises that were well-tolerated today. Patient reported understanding and with no complaints at end of session. Patient progressing towards goals.    Comorbidities  OA, LBP, HLD, heart murmur, asthma, DM, CAD, anemia, ADD, achilles tenotomy, L heart cath & angio 06/2017, coronary stent 06/2017, C4-6 fusion    Rehab Potential  Good    PT Treatment/Interventions  ADLs/Self Care Home Management;Cryotherapy;Electrical Stimulation;Moist Heat;Therapeutic exercise;Therapeutic activities;Functional mobility training;Ultrasound;Neuromuscular re-education;Patient/family education;Manual techniques;Vasopneumatic Device;Taping;Energy conservation;Dry needling;Passive range of motion;Iontophoresis 67m/ml Dexamethasone    PT Next Visit Plan  progress postural and R shoulder strengthening activities pt pt. tolerance; progress ROM activities; modalities for pain relief prn    Consulted and Agree with Plan of Care  Patient       Patient will benefit from skilled therapeutic intervention in order to improve the following deficits  and impairments:  Hypomobility, Impaired sensation, Decreased activity tolerance, Decreased strength, Increased fascial restricitons, Impaired UE functional use, Pain, Improper body mechanics, Decreased range of motion, Postural dysfunction, Impaired flexibility  Visit Diagnosis: Pain in right arm  Stiffness of right shoulder, not elsewhere classified  Muscle weakness (generalized)  Abnormal posture     Problem List Patient Active Problem List   Diagnosis Date Noted  . Mild persistent asthma/cough variant asthma 04/13/2019  . Allergic rhinitis with a possible nonallergic component 04/13/2019  . Acid reflux 04/13/2019  . Cough 04/13/2019  . Hoarseness 04/13/2019  . Cervical radicular pain 01/10/2019  . Ulnar neuropathy at elbow of right upper extremity 01/03/2019  . Acute bilateral low back pain without sciatica 01/03/2019  . Onychomycosis 05/25/2018  . Palpitations 07/06/2017  . CAD (coronary artery disease) 07/05/2017  . Family history of early CAD 07/05/2017  . Abnormal findings on diagnostic imaging of cardiovascular system 06/23/2017  . Low vitamin D level 04/22/2017  . Pain in right ankle and joints of right foot 10/05/2016  . Hemarthrosis of right elbow 02/03/2016  . Small thenar eminence 02/03/2016  . Cervical disc disorder with radiculopathy of cervical region 10/07/2015  . Incomplete rotator cuff tear 09/10/2015  . Nonischemic cardiomyopathy ([place] 05/15/2015  . Fatigue 05/15/2015  . Dyspnea on exertion 05/15/2015  . Decreased cardiac ejection fraction 04/02/2015  . Strain of latissimus dorsi muscle 01/21/2015  . Shoulder bursitis 10/12/2014  . Stenosis of lateral  recess of lumbar spine 10/12/2014  . BPPV (benign paroxysmal positional vertigo) 08/14/2014  . Hyperlipidemia 01/19/2014  . Anemia, unspecified 01/19/2014  . ADD (attention deficit disorder) 01/19/2014  . Type 2 diabetes mellitus ([place]) 12/22/2013  . Hypogonadism in male 12/22/2013  . Arthritis of  hand, left 12/22/2013     Janene Harvey, PT, DPT 06/07/19 2:16 PM   [facility] [address]  [place], [place], [address] Phone: [phone]   Fax:  [phone]  Name: Reginald Rios MRN: [id number] Date of Birth: May 28, 1955

## 2019-06-09 ENCOUNTER — Ambulatory Visit: Payer: 59 | Attending: Internal Medicine

## 2019-06-09 DIAGNOSIS — Z23 Encounter for immunization: Secondary | ICD-10-CM | POA: Insufficient documentation

## 2019-06-09 NOTE — Progress Notes (Signed)
° °  Covid-19 Vaccination Clinic  Name:  Reginald Rios    MRN: [id number] DOB: 1955-12-21  06/09/2019  [name] was observed post Covid-19 immunization for 30 minutes based on pre-vaccination screening without incidence. He was provided with Vaccine Information Sheet and instruction to access the V-Safe system.   [name] was instructed to call 911 with any severe reactions post vaccine:  Difficulty breathing   Swelling of your face and throat   A fast heartbeat   A bad rash all over your body   Dizziness and weakness    Immunizations Administered    Name Date Dose VIS Date Route   Pfizer COVID-19 Vaccine 06/09/2019  9:50 AM 0.3 mL 04/14/2019 Intramuscular   Manufacturer: [place]   Lot: [id number]   [place]: [id number]

## 2019-06-12 ENCOUNTER — Other Ambulatory Visit: Payer: Self-pay

## 2019-06-12 ENCOUNTER — Encounter: Payer: Self-pay | Admitting: Physical Therapy

## 2019-06-12 ENCOUNTER — Ambulatory Visit: Payer: 59 | Admitting: Physical Therapy

## 2019-06-12 DIAGNOSIS — M79601 Pain in right arm: Secondary | ICD-10-CM | POA: Diagnosis not present

## 2019-06-12 DIAGNOSIS — R293 Abnormal posture: Secondary | ICD-10-CM

## 2019-06-12 DIAGNOSIS — M25611 Stiffness of right shoulder, not elsewhere classified: Secondary | ICD-10-CM | POA: Diagnosis not present

## 2019-06-12 DIAGNOSIS — M6281 Muscle weakness (generalized): Secondary | ICD-10-CM

## 2019-06-12 NOTE — Therapy (Signed)
[facility] [address]  [place], [place], [address] Phone: [phone]   Fax:  [phone]  Physical Therapy Treatment  Patient Details  Name: Reginald Rios MRN: [id number] Date of Birth: 04-10-1956 Referring Provider (PT): Crosby Oyster [place], [place]   Encounter Date: 06/12/2019  PT End of Session - 06/12/19 1828    Visit Number  17    Number of Visits  20    Date for PT Re-Evaluation  06/14/19    Authorization Type  [facility]    PT Start Time  1316    PT Stop Time  1411    PT Time Calculation (min)  55 min    Activity Tolerance  Patient tolerated treatment well;Patient limited by pain    Behavior During Therapy  [facility] for tasks assessed/performed       Past Medical History:  Diagnosis Date  . ADD (attention deficit disorder)   . Anemia   . Arthritis   . CAD (coronary artery disease)    2/19 PCI/DESx1 to Lcx  . Diabetes mellitus without complication ([place])   . Exercise-induced asthma   . Heart murmur   . Hyperlipidemia    family hx of high cholesterol  . Hypogonadism in male   . Low back pain   . Osteoarthritis     Past Surgical History:  Procedure Laterality Date  . CORONARY STENT INTERVENTION N/A 06/23/2017   Procedure: CORONARY STENT INTERVENTION;  Surgeon: Leonie Man, MD;  Location: [place] [facility];  Service: Cardiovascular;  Laterality: N/A;  . INTRAVASCULAR PRESSURE WIRE/FFR STUDY N/A 06/23/2017   Procedure: INTRAVASCULAR PRESSURE WIRE/FFR STUDY;  Surgeon: Leonie Man, MD;  Location: [place] [facility];  Service: Cardiovascular;  Laterality: N/A;  . KNEE ARTHROSCOPY    . LEFT HEART CATH AND CORONARY ANGIOGRAPHY N/A 06/23/2017   Procedure: LEFT HEART CATH AND CORONARY ANGIOGRAPHY;  Surgeon: Leonie Man, MD;  Location: [place] [facility];  Service: Cardiovascular;  Laterality: N/A;  . TENOTOMY ACHILLES TENDON      There were no vitals filed for this visit.  Subjective Assessment  - 06/12/19 1317    Subjective  Did some work up in the attic with a staple gun which was pretty tough, but the pain wasn't bad until he went to bed. Noting new onset of N/T in L digits 2&3 of about 1 week duration.    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 Chronic denervation in right triceps may be related to underlying right C7 radiculopathy.    Patient Stated Goals  get rid of pain    Currently in Pain?  Yes    Pain Score  5     Pain Location  Shoulder    Pain Orientation  Right    Pain Descriptors / Indicators  Sore    Pain Type  Chronic pain    Pain Score  8    Pain Location  Elbow    Pain Orientation  Right    Pain Descriptors / Indicators  Tingling    Pain Type  Acute pain                       [facility] Adult PT Treatment/Exercise - 06/12/19 0001  Shoulder Exercises: Supine   Protraction  Strengthening;Right;15 reps;Theraband    Theraband Level (Shoulder Protraction)  Level 3 (Green)    Protraction Limitations  2x10; cues to maintain elbows extended      Shoulder Exercises: Standing   Protraction  Strengthening;Both;10 reps;Theraband    Theraband Level (Shoulder Protraction)  Level 3 (Green)    Protraction Limitations  serratus punch against wall   very limited ROM and difficulty performing   Horizontal ABduction  Both;Strengthening;Theraband    Theraband Level (Shoulder Horizontal ABduction)  Level 1 (Yellow)    Horizontal ABduction Limitations  cues to maintain symmetrical ROM    External Rotation  Strengthening;10 reps;Theraband;Both    Theraband Level (Shoulder External Rotation)  Level 1 (Yellow)    External Rotation Limitations  cues to maintain shoulders down and back    Flexion  AROM;Both;10 reps    Flexion Limitations  serratus roll ups  with foam roll   good ROM   Other Standing Exercises  shallow wall pushup 2x10   unable to extend R elbow     Shoulder Exercises: ROM/Strengthening   UBE (Upper Arm Bike)  L1.5 x 3 min forward/3 min back      Moist Heat Therapy   Number Minutes Moist Heat  10 Minutes    Moist Heat Location  Shoulder   R biceps     Manual Therapy   Manual Therapy  Passive ROM    Manual therapy comments  supine    Soft tissue mobilization  STM to R proximal biceps- large palpable trigger point and tenderness    Myofascial Release  manual TPR to R biceps-     Passive ROM  R shoulder PROM in all directions to tolerance   most limitation in IR              PT Short Term Goals - 05/03/19 1106      PT SHORT TERM GOAL #1   Title  Patient to be independent with initial HEP.    Time  2    Period  Weeks    Status  Achieved    Target Date  04/24/19        PT Long Term Goals - 05/03/19 1106      PT LONG TERM GOAL #1   Title  Patient to be independent with advanced HEP.    Time  6    Period  Weeks    Status  Partially Met   met for current   Target Date  06/14/19      PT LONG TERM GOAL #2   Title  Patient to demonstrate [facility] and pain-free R shoulder AROM/PROM.    Time  6    Period  Weeks    Status  Partially Met   PROM improved in shoulder abduction, IR, ER and AROM in flexion and abduction   Target Date  06/14/19      PT LONG TERM GOAL #3   Title  Patient to demonstrate >=4+/5 strength in R UE and demonstrate atleast 40lbs in R grip strength.    Time  6    Period  Weeks    Status  Partially Met   met for grip strength; shoulder strength improved in all planes as well as elbow and wrist extension strength   Target Date  06/14/19      PT LONG TERM GOAL #4   Title  Patient to report 70% improvement in pain levels with overhead reaching.    Time  4    Period  Weeks    Status  Achieved   reporting 70% improvement     PT LONG TERM GOAL #5   Title  Patient to report <3/10 pain  in R UE with shaving.    Time  6    Period  Weeks    Status  On-going   reporting 8/10 pain with shaving   Target Date  06/14/19            Plan - 06/12/19 1829    Clinical Impression Statement  Patient reporting ability to complete a project using a staple gun with increase in pain later that night. Patient tolerated manual therapy for improvement in R shoulder ROM- today demonstrating most limitation in IR and with relative improvement in ER. Initiated light WBing activities with shallow wall push ups, with good tolerance. More difficulty with serratus punches against wall. Thus, performed this exercise in supine with more success. Patient performed periscapular strengthening with intermittent c/o burning in neck, but with relief of this sensation when shoulders were depressed. Provided manual therapy to R proximal biceps to relieve pain and soft tissue restriction- patient with palpable decrease in size of trigger point in this area. Ended session with moist heat to R biceps. Patient with report of return of pain after modality, but reporting relief of muscle tightness.    Comorbidities  OA, LBP, HLD, heart murmur, asthma, DM, CAD, anemia, ADD, achilles tenotomy, L heart cath & angio 06/2017, coronary stent 06/2017, C4-6 fusion    Rehab Potential  Good    PT Treatment/Interventions  ADLs/Self Care Home Management;Cryotherapy;Electrical Stimulation;Moist Heat;Therapeutic exercise;Therapeutic activities;Functional mobility training;Ultrasound;Neuromuscular re-education;Patient/family education;Manual techniques;Vasopneumatic Device;Taping;Energy conservation;Dry needling;Passive range of motion;Iontophoresis 69m/ml Dexamethasone    PT Next Visit Plan  progress postural and R shoulder strengthening activities pt pt. tolerance; progress ROM activities; modalities for pain relief prn    Consulted and Agree with Plan of Care  Patient       Patient will benefit from skilled therapeutic  intervention in order to improve the following deficits and impairments:  Hypomobility, Impaired sensation, Decreased activity tolerance, Decreased strength, Increased fascial restricitons, Impaired UE functional use, Pain, Improper body mechanics, Decreased range of motion, Postural dysfunction, Impaired flexibility  Visit Diagnosis: Pain in right arm  Stiffness of right shoulder, not elsewhere classified  Muscle weakness (generalized)  Abnormal posture     Problem List Patient Active Problem List   Diagnosis Date Noted  . Mild persistent asthma/cough variant asthma 04/13/2019  . Allergic rhinitis with a possible nonallergic component 04/13/2019  . Acid reflux 04/13/2019  . Cough 04/13/2019  . Hoarseness 04/13/2019  . Cervical radicular pain 01/10/2019  . Ulnar neuropathy at elbow of right upper extremity 01/03/2019  . Acute bilateral low back pain without sciatica 01/03/2019  . Onychomycosis 05/25/2018  . Palpitations 07/06/2017  . CAD (coronary artery disease) 07/05/2017  . Family history of early CAD 07/05/2017  . Abnormal findings on diagnostic imaging of cardiovascular system 06/23/2017  . Low vitamin D level 04/22/2017  . Pain in right ankle and joints of right foot 10/05/2016  . Hemarthrosis of right elbow 02/03/2016  . Small thenar eminence 02/03/2016  . Cervical disc disorder with radiculopathy of cervical region 10/07/2015  . Incomplete rotator cuff tear 09/10/2015  . [place]ischemic cardiomyopathy ([place] 05/15/2015  . Fatigue 05/15/2015  . Dyspnea on exertion 05/15/2015  . Decreased cardiac ejection fraction 04/02/2015  . Strain of latissimus dorsi muscle 01/21/2015  . Shoulder bursitis 10/12/2014  .  Stenosis of lateral recess of lumbar spine 10/12/2014  . BPPV (benign paroxysmal positional vertigo) 08/14/2014  . Hyperlipidemia 01/19/2014  . Anemia, unspecified 01/19/2014  . ADD (attention deficit disorder) 01/19/2014  . Type 2 diabetes mellitus ([place]) 12/22/2013   . Hypogonadism in male 12/22/2013  . Arthritis of hand, left 12/22/2013     Janene Harvey, PT, DPT 06/12/19 6:33 PM   [facility] [address]  [place], [place], [address] Phone: [phone]   Fax:  [phone]  Name: EMAAD NANNA MRN: [id number] Date of Birth: [birth year]

## 2019-06-14 ENCOUNTER — Other Ambulatory Visit: Payer: Self-pay

## 2019-06-14 ENCOUNTER — Ambulatory Visit: Payer: 59 | Admitting: Physical Therapy

## 2019-06-14 ENCOUNTER — Encounter: Payer: Self-pay | Admitting: Physical Therapy

## 2019-06-14 DIAGNOSIS — M79601 Pain in right arm: Secondary | ICD-10-CM

## 2019-06-14 DIAGNOSIS — M25611 Stiffness of right shoulder, not elsewhere classified: Secondary | ICD-10-CM

## 2019-06-14 DIAGNOSIS — M6281 Muscle weakness (generalized): Secondary | ICD-10-CM

## 2019-06-14 DIAGNOSIS — R293 Abnormal posture: Secondary | ICD-10-CM

## 2019-06-14 MED FILL — PREGABALIN 75 MG CAPS: 75 | 30 days supply | Qty: 60 | Fill #2

## 2019-06-14 NOTE — Therapy (Addendum)
[facility] [address]  [place], [place], [address] Phone: [phone]   Fax:  [phone]  Physical Therapy Treatment  Patient Details  Name: Reginald Rios MRN: [id number] Date of Birth: [birth year] Referring Provider (PT): Crosby Oyster [place], [place]   Encounter Date: 06/14/2019  PT End of Session - 06/14/19 1449    Visit Number  18    Number of Visits  20    Date for PT Re-Evaluation  06/14/19    Authorization Type  [facility]    PT Start Time  6812    PT Stop Time  1446    PT Time Calculation (min)  48 min    Activity Tolerance  Patient tolerated treatment well;Patient limited by pain    Behavior During Therapy  [facility] for tasks assessed/performed       Past Medical History:  Diagnosis Date  . ADD (attention deficit disorder)   . Anemia   . Arthritis   . CAD (coronary artery disease)    2/19 PCI/DESx1 to Lcx  . Diabetes mellitus without complication ([place])   . Exercise-induced asthma   . Heart murmur   . Hyperlipidemia    family hx of high cholesterol  . Hypogonadism in male   . Low back pain   . Osteoarthritis     Past Surgical History:  Procedure Laterality Date  . CORONARY STENT INTERVENTION N/A 06/23/2017   Procedure: CORONARY STENT INTERVENTION;  Surgeon: Leonie Man, MD;  Location: [place] [facility];  Service: Cardiovascular;  Laterality: N/A;  . INTRAVASCULAR PRESSURE WIRE/FFR STUDY N/A 06/23/2017   Procedure: INTRAVASCULAR PRESSURE WIRE/FFR STUDY;  Surgeon: Leonie Man, MD;  Location: [place] [facility];  Service: Cardiovascular;  Laterality: N/A;  . KNEE ARTHROSCOPY    . LEFT HEART CATH AND CORONARY ANGIOGRAPHY N/A 06/23/2017   Procedure: LEFT HEART CATH AND CORONARY ANGIOGRAPHY;  Surgeon: Leonie Man, MD;  Location: [place] [facility];  Service: Cardiovascular;  Laterality: N/A;  . TENOTOMY ACHILLES TENDON      There were no vitals filed for this visit.  Subjective Assessment  - 06/14/19 1359    Subjective  Worked on his HEP and only had pain with ER stretch in doorway. Notes 65-70% improvement in R shoulder since initial eval. Would like to continue working on ROM.    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 Chronic denervation in right triceps may be related to underlying right C7 radiculopathy.    Patient Stated Goals  get rid of pain    Currently in Pain?  Yes    Pain Score  6     Pain Location  Shoulder    Pain Orientation  Right    Pain Descriptors / Indicators  Aching    Pain Type  Chronic pain         [facility] PT Assessment - 06/14/19 0001      Assessment   Medical Diagnosis  R arm pain    Referring Provider (PT)  [name], DO    Onset Date/Surgical Date  06/04/18      AROM   Right Shoulder Flexion  127 Degrees    Right Shoulder ABduction  109 Degrees    Right Shoulder Internal Rotation  --  FIR T11   Right Shoulder External Rotation  --   FER C5     PROM   Right Shoulder Flexion  159 Degrees    Right Shoulder ABduction  148 Degrees    Right Shoulder Internal Rotation  97 Degrees    Right Shoulder External Rotation  68 Degrees      Strength   Right Shoulder Flexion  4+/5    Right Shoulder ABduction  4+/5    Right Shoulder Internal Rotation  4+/5    Right Shoulder External Rotation  4/5    Right Elbow Flexion  4+/5    Right Elbow Extension  4+/5    Right Wrist Flexion  4+/5    Right Wrist Extension  4+/5    Right Hand Grip (lbs)  44.67   45,44,45                  [facility] Adult PT Treatment/Exercise - 06/14/19 0001      Shoulder Exercises: Seated   Abduction  Strengthening;Right;10 reps;Weights    ABduction Weight (lbs)  1    ABduction Limitations  cues to avoid pushing into pain      Shoulder  Exercises: Pulleys   Flexion  3 minutes    Flexion Limitations  to tolerance    Scaption  3 minutes    Scaption Limitations  to tolerance      Manual Therapy   Manual Therapy  Passive ROM    Manual therapy comments  supine    Passive ROM  R shoulder PROM in all directions to tolerance   pain at end ranges of IR/ER            PT Education - 06/14/19 1449    Education Details  update & review of HEP; edu on use of over the door pulley for ROM, discussion on objective progress and remaining impairments    Person(s) Educated  Patient    Methods  Explanation;Demonstration;Tactile cues;Verbal cues;Handout    Comprehension  Verbalized understanding;Returned demonstration       PT Short Term Goals - 06/14/19 1406      PT SHORT TERM GOAL #1   Title  Patient to be independent with initial HEP.    Time  2    Period  Weeks    Status  Achieved    Target Date  04/24/19        PT Long Term Goals - 06/14/19 1406      PT LONG TERM GOAL #1   Title  Patient to be independent with advanced HEP.    Time  6    Period  Weeks    Status  Partially Met   met for current     PT LONG TERM GOAL #2   Title  Patient to demonstrate [facility] and pain-free R shoulder AROM/PROM.    Time  6    Period  Weeks    Status  Partially Met   improvement PROM in all planes, AROM improved in flexion, IR, ER     PT LONG TERM GOAL #3   Title  Patient to demonstrate >=4+/5 strength in R UE and demonstrate atleast 40lbs in R grip strength.    Time  6    Period  Weeks    Status  Partially Met   met for grip strength; shoulder strength improved in shoulder abduction, ER, elbow flexion, wrist flexion     PT LONG TERM GOAL #4   Title  Patient to report 70%  improvement in pain levels with overhead reaching.    Time  4    Period  Weeks    Status  Achieved   reporting 70% improvement     PT LONG TERM GOAL #5   Title  Patient to report <3/10 pain in R UE with shaving.    Time  6    Period  Weeks    Status   Not Met   reporting 8/10 pain with shaving           Plan - 06/14/19 1451    Clinical Impression Statement  Patient reporting better tolerance with HEP, yesterday only with pain with ER stretch in doorway. Notes 65-70% improvement in R shoulder since initial eval. Would like to continue working on ROM. Patient has demonstrated improvement in all planes of R shoulder PROM; AROM has improved in flexion, IR, ER. Patient has met goal for grip strength; strength improved in shoulder abduction, ER, elbow flexion, and wrist flexion. Patient still reports continued severe pain with shaving, however this pain be more d/t ongoing R elbow pain. Reviewed and updated HEP to address remaining impairments. Also educated patient on use of over the door pulley at home for continued progress in ROM. Patient reported understanding of all edu provided today. Notes that he feels comfortable with a transition to home program at this time, thus placing patient on 30 day hold in case problems arise.    Comorbidities  OA, LBP, HLD, heart murmur, asthma, DM, CAD, anemia, ADD, achilles tenotomy, L heart cath & angio 06/2017, coronary stent 06/2017, C4-6 fusion    Rehab Potential  Good    PT Treatment/Interventions  ADLs/Self Care Home Management;Cryotherapy;Electrical Stimulation;Moist Heat;Therapeutic exercise;Therapeutic activities;Functional mobility training;Ultrasound;Neuromuscular re-education;Patient/family education;Manual techniques;Vasopneumatic Device;Taping;Energy conservation;Dry needling;Passive range of motion;Iontophoresis 69m/ml Dexamethasone    PT Next Visit Plan  30 day hold at this time    Consulted and Agree with Plan of Care  Patient       Patient will benefit from skilled therapeutic intervention in order to improve the following deficits and impairments:  Hypomobility, Impaired sensation, Decreased activity tolerance, Decreased strength, Increased fascial restricitons, Impaired UE functional use,  Pain, Improper body mechanics, Decreased range of motion, Postural dysfunction, Impaired flexibility  Visit Diagnosis: Pain in right arm  Stiffness of right shoulder, not elsewhere classified  Muscle weakness (generalized)  Abnormal posture     Problem List Patient Active Problem List   Diagnosis Date Noted  . Mild persistent asthma/cough variant asthma 04/13/2019  . Allergic rhinitis with a possible nonallergic component 04/13/2019  . Acid reflux 04/13/2019  . Cough 04/13/2019  . Hoarseness 04/13/2019  . Cervical radicular pain 01/10/2019  . Ulnar neuropathy at elbow of right upper extremity 01/03/2019  . Acute bilateral low back pain without sciatica 01/03/2019  . Onychomycosis 05/25/2018  . Palpitations 07/06/2017  . CAD (coronary artery disease) 07/05/2017  . Family history of early CAD 07/05/2017  . Abnormal findings on diagnostic imaging of cardiovascular system 06/23/2017  . Low vitamin D level 04/22/2017  . Pain in right ankle and joints of right foot 10/05/2016  . Hemarthrosis of right elbow 02/03/2016  . Small thenar eminence 02/03/2016  . Cervical disc disorder with radiculopathy of cervical region 10/07/2015  . Incomplete rotator cuff tear 09/10/2015  . [place]ischemic cardiomyopathy ([place] 05/15/2015  . Fatigue 05/15/2015  . Dyspnea on exertion 05/15/2015  . Decreased cardiac ejection fraction 04/02/2015  . Strain of latissimus dorsi muscle 01/21/2015  . Shoulder bursitis 10/12/2014  .  Stenosis of lateral recess of lumbar spine 10/12/2014  . BPPV (benign paroxysmal positional vertigo) 08/14/2014  . Hyperlipidemia 01/19/2014  . Anemia, unspecified 01/19/2014  . ADD (attention deficit disorder) 01/19/2014  . Type 2 diabetes mellitus ([place]) 12/22/2013  . Hypogonadism in male 12/22/2013  . Arthritis of hand, left 12/22/2013    Janene Harvey, PT, DPT 06/14/19 2:57 PM   [facility] [address]   [place], [place], [address] Phone: [phone]   Fax:  [phone]  Name: Reginald Rios MRN: [id number] Date of Birth: 12-12-1955   PHYSICAL THERAPY DISCHARGE SUMMARY  Visits from Start of Care: 18  Current functional level related to goals / functional outcomes: See above clinical impression; patient did not return after 30 day hold   Remaining deficits: Decreased shoulder AROM and strength; pain with ADLs   Education / Equipment: HEP  Plan: Patient agrees to discharge.  Patient goals were partially met. Patient is being discharged due to being pleased with the current functional level.  ?????     Janene Harvey, PT, DPT 08/01/19 1:02 PM

## 2019-06-15 ENCOUNTER — Other Ambulatory Visit: Payer: Self-pay | Admitting: Family Medicine

## 2019-06-15 MED FILL — JANUVIA 100 MG TABLET: 100 | 30 days supply | Qty: 30 | Fill #0

## 2019-06-20 ENCOUNTER — Other Ambulatory Visit: Payer: Self-pay

## 2019-06-20 ENCOUNTER — Encounter: Payer: Self-pay | Admitting: Physical Medicine & Rehabilitation

## 2019-06-20 ENCOUNTER — Encounter: Payer: 59 | Attending: Physical Medicine & Rehabilitation | Admitting: Physical Medicine & Rehabilitation

## 2019-06-20 VITALS — BP 142/80 | HR 84 | Temp 97.7°F | Ht 70.0 in | Wt 197.2 lb

## 2019-06-20 DIAGNOSIS — M7501 Adhesive capsulitis of right shoulder: Secondary | ICD-10-CM | POA: Insufficient documentation

## 2019-06-20 DIAGNOSIS — G5621 Lesion of ulnar nerve, right upper limb: Secondary | ICD-10-CM | POA: Insufficient documentation

## 2019-06-20 DIAGNOSIS — M25521 Pain in right elbow: Secondary | ICD-10-CM | POA: Insufficient documentation

## 2019-06-20 DIAGNOSIS — M5412 Radiculopathy, cervical region: Secondary | ICD-10-CM | POA: Insufficient documentation

## 2019-06-20 DIAGNOSIS — G8929 Other chronic pain: Secondary | ICD-10-CM | POA: Diagnosis not present

## 2019-06-20 DIAGNOSIS — M24511 Contracture, right shoulder: Secondary | ICD-10-CM | POA: Diagnosis not present

## 2019-06-20 MED ORDER — TRAMADOL HCL 50 MG PO TABS: 50.0000 mg | ORAL_TABLET | Freq: Two times a day (BID) | ORAL | 0 refills | Status: DC | PRN

## 2019-06-20 MED FILL — traMADol HCL 50 MG TABS: 50 | 7 days supply | Qty: 15 | Fill #0

## 2019-06-20 NOTE — Patient Instructions (Signed)
Will check if insurance covers acupuncture

## 2019-06-20 NOTE — Progress Notes (Signed)
Subjective:    Patient ID: Reginald Rios, male    DOB: May 31, 1955, 64 y.o.   MRN: [id number]  HPI 64 year old male, who has been a Biochemist, clinical for many years but has not worked since March 2020 because of neck and shoulder pain.  He had a fall around that time he complained of neck pain radiating down the right posterior arm.  He has a very complex history and is not a very good historian.  He thinks he has been in [place] for about 3 years but he has notes going back to 2015 in epic from his care in [place]. C4-6 fusion performed by Dr. Kristeen Miss, the patient does not recall where this was done.  May have been in day surgery given that it does not show up on the epic chart. The patient had severe spinal stenosis at C4-5 and C5-6 both central and neuroforaminal.  This was seen on an MRI dated 02/28/2016.  Cervical MRI 09/05/2018 showed no cervical spinal stenosis.  His operative sites were adequately decompressed.  There was no new cervical disc since the prior MRI.  In the past patient has been treated for triceps tendinitis by family practice sports medicine as well as lumbar radiculopathy and has undergone epidural per radiology in the lumbar area  Right ulnar neuropathy with chronic right hand intrinsic atrophy S/p transposition while he lived in [place].  He estimates that surgery was about 20 years ago  The patient has been through several rounds of physical therapy concentrating more so recently on right shoulder external rotation.  This has improved although his pain still continues.  He has pain even when he brushes his teeth using his right hand.   Other past medical history includes coronary artery disease status post coronary stent Pain Inventory Average Pain 5 Pain Right Now 6 My pain is sharp, burning, stabbing and tingling  In the last 24 hours, has pain interfered with the following? General activity 5 Relation with others 5 Enjoyment of life 4 What  TIME of day is your pain at its worst? evening and night Sleep (in general) Fair  Pain is worse with: some activites Pain improves with: heat/ice, therapy/exercise, medication and TENS Relief from Meds: 6  Mobility how many minutes can you walk? unlimited ability to climb steps?  yes do you drive?  yes  Function not employed: date last employed .  Neuro/Psych weakness numbness tingling spasms  Prior Studies x-rays  Physicians involved in your care Primary care .   Family History  Problem Relation Age of Onset  . Lung cancer Mother 83  . Cancer Mother        lung cancer  . Coronary artery disease Father 57  . Heart disease Father   . Hypothyroidism Brother   . Food Allergy Son   . Asthma Paternal Aunt   . Colon cancer Neg Hx   . Prostate cancer Neg Hx   . Allergic rhinitis Neg Hx   . Angioedema Neg Hx   . Eczema Neg Hx   . Immunodeficiency Neg Hx   . Urticaria Neg Hx    Social History   Socioeconomic History  . Marital status: Married    Spouse name: Not on file  . Number of children: Not on file  . Years of education: Not on file  . Highest education level: Not on file  Occupational History  . Occupation: Air cabin crew   Tobacco Use  . Smoking status: Never Smoker  .  Smokeless tobacco: Never Used  Substance and Sexual Activity  . Alcohol use: Yes    Alcohol/week: 0.0 standard drinks    Comment: rare  . Drug use: No  . Sexual activity: Not on file  Other Topics Concern  . Not on file  Social History Narrative   Previously worked as Geophysicist/field seismologist   Currently working at [employer] clinic   Married 30 years   2 children ages 24, 26   Wife is psychiatrist - Dr. Margurite Auerbach   Moved from [place] up in [place] Determinants of Health   Financial Resource Strain:   . Difficulty of Paying Living Expenses: Not on file  Food Insecurity:   . Worried About Charity fundraiser in the Last Year: Not on file  . Ran Out of Food in the  Last Year: Not on file  Transportation Needs:   . Lack of Transportation (Medical): Not on file  . Lack of Transportation (Non-Medical): Not on file  Physical Activity:   . Days of Exercise per Week: Not on file  . Minutes of Exercise per Session: Not on file  Stress:   . Feeling of Stress : Not on file  Social Connections:   . Frequency of Communication with Friends and Family: Not on file  . Frequency of Social Gatherings with Friends and Family: Not on file  . Attends Religious Services: Not on file  . Active Member of Clubs or Organizations: Not on file  . Attends Archivist Meetings: Not on file  . Marital Status: Not on file   Past Surgical History:  Procedure Laterality Date  . CORONARY STENT INTERVENTION N/A 06/23/2017   Procedure: CORONARY STENT INTERVENTION;  Surgeon: Leonie Man, MD;  Location: [facility];  Service: Cardiovascular;  Laterality: N/A;  . INTRAVASCULAR PRESSURE WIRE/FFR STUDY N/A 06/23/2017   Procedure: INTRAVASCULAR PRESSURE WIRE/FFR STUDY;  Surgeon: Leonie Man, MD;  Location: [facility];  Service: Cardiovascular;  Laterality: N/A;  . KNEE ARTHROSCOPY    . LEFT HEART CATH AND CORONARY ANGIOGRAPHY N/A 06/23/2017   Procedure: LEFT HEART CATH AND CORONARY ANGIOGRAPHY;  Surgeon: Leonie Man, MD;  Location: [facility];  Service: Cardiovascular;  Laterality: N/A;  . TENOTOMY ACHILLES TENDON     Past Medical History:  Diagnosis Date  . ADD (attention deficit disorder)   . Anemia   . Arthritis   . CAD (coronary artery disease)    2/19 PCI/DESx1 to Lcx  . Diabetes mellitus without complication ([place])   . Exercise-induced asthma   . Heart murmur   . Hyperlipidemia    family hx of high cholesterol  . Hypogonadism in male   . Low back pain   . Osteoarthritis    BP (!) 142/80   Pulse 84   Temp 97.7 F (36.5 C)   Ht 5\' 10"  (1.778 m)   Wt 197 lb 3.2 oz (89.4 kg)   SpO2 96%   BMI 28.30 kg/m   Opioid Risk  Score:   Fall Risk Score:  `1  Depression screen PHQ 2/9  Depression screen PHQ 2/9 04/18/2018  Decreased Interest 0  Down, Depressed, Hopeless 0  PHQ - 2 Score 0    Review of Systems     Objective:   Physical Exam Neurological:     General: No focal deficit present.     Mental Status: He is oriented to person, place, and time.  Gait: Gait is intact.     Comments: Motor strength is 4/5 right grip 5/5 left grip 4 - right deltoid 5 left deltoid 5 bilateral bicep tricep  Psychiatric:        Mood and Affect: Mood normal.        Behavior: Behavior normal.    Patient has some pain with external rotation of his right shoulder. Positive speeds test Pain with shoulder abduction at 90 degrees. Sensation intact to pinprick bilateral C5, 6, 7, 8 dermatomal distribution Cervical spine range of motion is approximately 50% rotation to the right and to the left as well as lateral bending.  Cervical flexion and extension approximately 75% of normal range        Assessment & Plan:  #1.  Chronic neck and right shoulder pain.  I do not think that this represents a radiculopathy given his MRI findings as well as his physical exam findings. He has had recent x-ray demonstrating evidence of glenohumeral arthritis as well as AC joint arthritis as well as a high riding humeral head suggestive of some rotator cuff arthropathy. He has not responded well to conservative care including physical therapy. He is asking for some pain medications but nothing too strong.  We will trial some tramadol 50 mg twice daily I will ask orthopedics to evaluate him he has seen Dr. Erlinda Hong in the past will ask him to reevaluate the shoulder.  Likely will need an MRI of the shoulder.  He states that he has had acupuncture which was helpful in the past but could not continue this because insurance would not cover it.  We will see if his plan covers medical acupuncture.  I will see him back in 4 weeks to follow-up.  We  can start acupuncture if this is approved by [facility]  #2.  Right elbow pain x-rays show some mild degenerative changes.  It does appear to be lateral epicondylitis but he has not been playing tennis for almost a year.  He has surgical scars in that area and he does not remember whether or not he has had surgery for tennis elbow in the past.  He does recall his ulnar transposition but this does not appear to be the usual scar location for  that procedure.  We can ask orthopedics to evaluate this as well

## 2019-06-21 ENCOUNTER — Ambulatory Visit: Payer: 59 | Admitting: Orthopaedic Surgery

## 2019-06-21 ENCOUNTER — Encounter: Payer: Self-pay | Admitting: Orthopaedic Surgery

## 2019-06-21 ENCOUNTER — Ambulatory Visit (INDEPENDENT_AMBULATORY_CARE_PROVIDER_SITE_OTHER): Payer: 59

## 2019-06-21 VITALS — Ht 70.0 in | Wt 197.0 lb

## 2019-06-21 DIAGNOSIS — M25521 Pain in right elbow: Secondary | ICD-10-CM

## 2019-06-21 DIAGNOSIS — G5602 Carpal tunnel syndrome, left upper limb: Secondary | ICD-10-CM | POA: Diagnosis not present

## 2019-06-21 DIAGNOSIS — M25511 Pain in right shoulder: Secondary | ICD-10-CM

## 2019-06-21 DIAGNOSIS — G8929 Other chronic pain: Secondary | ICD-10-CM | POA: Diagnosis not present

## 2019-06-21 MED ORDER — PENNSAID 2 % EX SOLN: 2.0000 g | Freq: Two times a day (BID) | CUTANEOUS | 3 refills | Status: DC | PRN

## 2019-06-21 NOTE — Progress Notes (Signed)
Office Visit Note   Patient: Reginald Rios           Date of Birth: 1955/05/30           MRN: [id number] Visit Date: 06/21/2019              Requested by: Charlett Blake, MD [address] [place],  [place] [address] PCP: [name], DO   Assessment & Plan: Visit Diagnoses:  1. Pain in right elbow   2. Chronic right shoulder pain   3. Carpal tunnel syndrome, left upper limb     Plan: Impression is right shoulder pain likely rotator cuff syndrome will obtain MRI at this point to evaluate for structural abnormality given lack of relief from 2 previous cortisone injections.  He also has DJD of the right elbow.  Cortisone injection performed into the elbow joint today.  Patient tolerated well.  In terms of the left hand he declined nerve conduction studies to evaluate for nerve conduction studies.  We will see him back in about 10 to 14 days to review the right shoulder MRI.  Prescription for Pennsaid was sent in today.   Follow-Up Instructions: Return in about 2 weeks (around 07/05/2019).   Orders:  Orders Placed This Encounter  Procedures  . XR Elbow 2 Views Right  . MR SHOULDER RIGHT WO CONTRAST   Meds ordered this encounter  Medications  . Diclofenac Sodium (PENNSAID) 2 % SOLN    Sig: Apply 2 g topically 2 (two) times daily as needed (to affected area).    Dispense:  112 g    Refill:  3      Procedures: No procedures performed   Clinical Data: No additional findings.   Subjective: Chief Complaint  Patient presents with  . Right Shoulder - Pain  . Right Elbow - Pain    Reginald Rios is a 64 year old gentleman who I saw several years ago who comes in for evaluation of new problems related to his right shoulder, right elbow, left hand.  In terms of the right shoulder he has had difficulty raising his arm above the level of the shoulder.  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  He has not had any specific injuries  to the right shoulder.  He has had 2 subacromial injections which each have given him 1 week of relief.  He also has deep-seated right elbow pain without radiation of pain.  He had an ulnar nerve decompression and transposition over 20 years ago in [place].  He is also complaining of some numbness and tingling in his left index and long finger.  He cannot take NSAIDs due to being on anticoagulation.   Review of Systems  Constitutional: Negative.   All other systems reviewed and are negative.    Objective: Vital Signs: Ht 5\' 10"  (1.778 m)   Wt 197 lb (89.4 kg)   BMI 28.27 kg/m   Physical Exam Vitals and nursing note reviewed.  Constitutional:      Appearance: He is well-developed.  HENT:     Head: Normocephalic and atraumatic.  Eyes:     Pupils: Pupils are equal, round, and reactive to light.  Pulmonary:     Effort: Pulmonary effort is normal.  Abdominal:     Palpations: Abdomen is soft.  Musculoskeletal:        General: Normal range of motion.     Cervical back: Neck supple.  Skin:  General: Skin is warm.  Neurological:     Mental Status: He is alert and oriented to person, place, and time.  Psychiatric:        Behavior: Behavior normal.        Thought Content: Thought content normal.        Judgment: Judgment normal.     Ortho Exam Right shoulder exam shows moderate limitation in range of motion with moderate pain.  He has weakness with forward elevation and with empty can testing with moderate pain.  Pain with bearhug testing.  4 out of 5 rotator cuff strength throughout.  Positive impingement sign.  Right elbow exam shows flexion contracture of 15 degrees.  Lateral epicondyles nontender.  Some slight posterior tenderness in the olecranon fossa.  Right hand exam shows intrinsic atrophy.  Left hand exam shows negative carpal tunnel compressive signs.  Subjective paresthesias in the index and long finger. Specialty Comments:  No specialty comments  available.  Imaging: XR Elbow 2 Views Right  Result Date: 06/21/2019 Degenerative joint disease right elbow    PMFS History: Patient Active Problem List   Diagnosis Date Noted  . Mild persistent asthma/cough variant asthma 04/13/2019  . Allergic rhinitis with a possible nonallergic component 04/13/2019  . Acid reflux 04/13/2019  . Cough 04/13/2019  . Hoarseness 04/13/2019  . Cervical radicular pain 01/10/2019  . Ulnar neuropathy at elbow of right upper extremity 01/03/2019  . Acute bilateral low back pain without sciatica 01/03/2019  . Onychomycosis 05/25/2018  . Palpitations 07/06/2017  . CAD (coronary artery disease) 07/05/2017  . Family history of early CAD 07/05/2017  . Abnormal findings on diagnostic imaging of cardiovascular system 06/23/2017  . Low vitamin D level 04/22/2017  . Pain in right ankle and joints of right foot 10/05/2016  . Hemarthrosis of right elbow 02/03/2016  . Small thenar eminence 02/03/2016  . Cervical disc disorder with radiculopathy of cervical region 10/07/2015  . Incomplete rotator cuff tear 09/10/2015  . Nonischemic cardiomyopathy ([place]) 05/15/2015  . Fatigue 05/15/2015  . Dyspnea on exertion 05/15/2015  . Decreased cardiac ejection fraction 04/02/2015  . Strain of latissimus dorsi muscle 01/21/2015  . Shoulder bursitis 10/12/2014  . Stenosis of lateral recess of lumbar spine 10/12/2014  . BPPV (benign paroxysmal positional vertigo) 08/14/2014  . Hyperlipidemia 01/19/2014  . Anemia, unspecified 01/19/2014  . ADD (attention deficit disorder) 01/19/2014  . Type 2 diabetes mellitus ([place]) 12/22/2013  . Hypogonadism in male 12/22/2013  . Arthritis of hand, left 12/22/2013   Past Medical History:  Diagnosis Date  . ADD (attention deficit disorder)   . Anemia   . Arthritis   . CAD (coronary artery disease)    2/19 PCI/DESx1 to Lcx  . Diabetes mellitus without complication ([place])   . Exercise-induced asthma   . Heart murmur   .  Hyperlipidemia    family hx of high cholesterol  . Hypogonadism in male   . Low back pain   . Osteoarthritis     Family History  Problem Relation Age of Onset  . Lung cancer Mother 2  . Cancer Mother        lung cancer  . Coronary artery disease Father 65  . Heart disease Father   . Hypothyroidism Brother   . Food Allergy Son   . Asthma Paternal Aunt   . Colon cancer Neg Hx   . Prostate cancer Neg Hx   . Allergic rhinitis Neg Hx   . Angioedema Neg Hx   . Eczema  Neg Hx   . Immunodeficiency Neg Hx   . Urticaria Neg Hx     Past Surgical History:  Procedure Laterality Date  . CORONARY STENT INTERVENTION N/A 06/23/2017   Procedure: CORONARY STENT INTERVENTION;  Surgeon: Leonie Man, MD;  Location: [facility];  Service: Cardiovascular;  Laterality: N/A;  . INTRAVASCULAR PRESSURE WIRE/FFR STUDY N/A 06/23/2017   Procedure: INTRAVASCULAR PRESSURE WIRE/FFR STUDY;  Surgeon: Leonie Man, MD;  Location: [facility];  Service: Cardiovascular;  Laterality: N/A;  . KNEE ARTHROSCOPY    . LEFT HEART CATH AND CORONARY ANGIOGRAPHY N/A 06/23/2017   Procedure: LEFT HEART CATH AND CORONARY ANGIOGRAPHY;  Surgeon: Leonie Man, MD;  Location: [facility];  Service: Cardiovascular;  Laterality: N/A;  . TENOTOMY ACHILLES TENDON     Social History   Occupational History  . Occupation: Air cabin crew   Tobacco Use  . Smoking status: Never Smoker  . Smokeless tobacco: Never Used  Substance and Sexual Activity  . Alcohol use: Yes    Alcohol/week: 0.0 standard drinks    Comment: rare  . Drug use: No  . Sexual activity: Not on file

## 2019-06-22 ENCOUNTER — Ambulatory Visit: Payer: 59

## 2019-06-26 ENCOUNTER — Other Ambulatory Visit: Payer: Self-pay | Admitting: Family Medicine

## 2019-06-26 MED ORDER — MONTELUKAST SODIUM 10 MG PO TABS: 10.0000 mg | ORAL_TABLET | Freq: Every day | ORAL | 3 refills | Status: DC

## 2019-06-26 MED FILL — MONTELUKAST SOD 10 MG TAB: 10 | 90 days supply | Qty: 90 | Fill #0

## 2019-06-26 MED FILL — METHYLPHENIDATE 20 MG TAB: 20 | 30 days supply | Qty: 30 | Fill #0

## 2019-06-29 MED FILL — metFORMIN HCL 1000 MG TABS: 1000 | 90 days supply | Qty: 180 | Fill #1

## 2019-06-30 DIAGNOSIS — M542 Cervicalgia: Secondary | ICD-10-CM | POA: Diagnosis not present

## 2019-07-04 ENCOUNTER — Ambulatory Visit: Payer: 59 | Attending: Internal Medicine

## 2019-07-04 DIAGNOSIS — Z23 Encounter for immunization: Secondary | ICD-10-CM | POA: Insufficient documentation

## 2019-07-04 NOTE — Progress Notes (Signed)
   Covid-19 Vaccination Clinic  Name:  Reginald Rios    MRN: [id number] DOB: 12-31-1955  07/04/2019  [name] was observed post Covid-19 immunization for 15 minutes without incident. He was provided with Vaccine Information Sheet and instruction to access the V-Safe system.   [name] was instructed to call 911 with any severe reactions post vaccine: Marland Kitchen Difficulty breathing  . Swelling of face and throat  . A fast heartbeat  . A bad rash all over body  . Dizziness and weakness   Immunizations Administered    Name Date Dose VIS Date Route   Pfizer COVID-19 Vaccine 07/04/2019 10:42 AM 0.3 mL 04/14/2019 Intramuscular   Manufacturer: [place]   Lot: [id number]   [place]: [id number]

## 2019-07-08 NOTE — Progress Notes (Signed)
Cardiology Office Note:    Date:  07/10/2019   ID:  Reginald Rios, DOB [birth year], MRN [id number]  PCP:  [name], DO  Cardiologist:  Shirlee More, MD    Referring MD: [name]*    ASSESSMENT:    1. Coronary artery disease involving native coronary artery of native heart without angina pectoris   2. Hyperlipidemia, unspecified hyperlipidemia type   3. Type 2 diabetes mellitus with diabetic polyneuropathy, with long-term current use of insulin (HCC)    PLAN:    In order of problems listed above:  1. Stable CAD New York Heart Association class I, he had residual LAD stenosis looks like he is going to have orthopedic surgery and I will asked him undergo myocardial perfusion study for risk assessment and also guide whether we can stop aspirin and continue single antiplatelet agent clopidogrel. 2. Continue combined therapy statin Zetia as he is PCP tomorrow and I asked him to be sure to check a CMP and lipid profile 3. Stable managed by his primary care physician 4. Gastroesophageal reflux initiate Protonix as his Prilosec was stopped due to concerns of drug interaction   Next appointment: 6 months   Medication Adjustments/Labs and Tests Ordered: Current medicines are reviewed at length with the patient today.  Concerns regarding medicines are outlined above.  No orders of the defined types were placed in this encounter.  No orders of the defined types were placed in this encounter.   Chief Complaint  Patient presents with  . Follow-up  . Coronary Artery Disease  . Diabetes Mellitus  . Hyperlipidemia  . Palpitations    With frequent APCs on Holter monitor 2019    History of Present Illness:    Reginald Rios is a 64 y.o. male with a hx of CAD EF 50-55% with PCI and stenting to the mid distal circumflex 06/23/17 and a  calcified LAD 60% with FFR of 0.83, T2DM and hyperlipidemia. last seen 01/10/2019.  EKG performed 01/10/2019 showed sinus  bradycardia otherwise normal. Compliance with diet, lifestyle and medications: Yes  He was prescribed Prilosec by allergy markedly helped his reflux symptoms and chronic cough but he stopped it because he was told by the pharmacy that there is a detrimental interaction with clopidogrel.  I will switch him to Protonix today.  He has some symptoms of heartburn indigestion nonanginal remains active and no exertional chest pain shortness of breath palpitation or syncope.  He has severe right shoulder pain anticipates surgery for rotator cuff injury undergo myocardial perfusion study with residual CAD 2 years out from PCI and stent.  He tolerates his lipid-lowering therapy without muscle pain or weakness.  If the study is low risk I will have him drop out his aspirin and continue clopidogrel Past Medical History:  Diagnosis Date  . ADD (attention deficit disorder)   . Anemia   . Arthritis   . CAD (coronary artery disease)    2/19 PCI/DESx1 to Lcx  . Diabetes mellitus without complication ([place])   . Exercise-induced asthma   . Heart murmur   . Hyperlipidemia    family hx of high cholesterol  . Hypogonadism in male   . Low back pain   . Osteoarthritis     Past Surgical History:  Procedure Laterality Date  . CORONARY STENT INTERVENTION N/A 06/23/2017   Procedure: CORONARY STENT INTERVENTION;  Surgeon: [facility]ie Man, MD;  Location: [facility];  Service: Cardiovascular;  Laterality: N/A;  . INTRAVASCULAR PRESSURE  WIRE/FFR STUDY N/A 06/23/2017   Procedure: INTRAVASCULAR PRESSURE WIRE/FFR STUDY;  Surgeon: [facility]ie Man, MD;  Location: [facility];  Service: Cardiovascular;  Laterality: N/A;  . KNEE ARTHROSCOPY    . LEFT HEART CATH AND CORONARY ANGIOGRAPHY N/A 06/23/2017   Procedure: LEFT HEART CATH AND CORONARY ANGIOGRAPHY;  Surgeon: [facility]ie Man, MD;  Location: [facility];  Service: Cardiovascular;  Laterality: N/A;  . TENOTOMY ACHILLES TENDON      Current  Medications: Current Meds  Medication Sig  . albuterol (VENTOLIN HFA) 108 (90 Base) MCG/ACT inhaler 2 puffs every 4-6 hours as needed for coughing or wheezing spells  . aspirin EC 81 MG tablet Take 81 mg by mouth daily.  . Blood Glucose Monitoring Suppl (FREESTYLE FREEDOM LITE) w/Device KIT Use to check blood sugar daily.  . clopidogrel (PLAVIX) 75 MG tablet TAKE 1 TABLET (75 MG TOTAL) BY MOUTH DAILY WITH BREAKFAST.  Marland Kitchen Coenzyme Q10 200 MG capsule Take 200 mg by mouth daily.  . dapagliflozin propanediol (FARXIGA) 10 MG TABS tablet Take 10 mg by mouth daily.  Marland Kitchen ezetimibe (ZETIA) 10 MG tablet TAKE 1 TABLET (10 MG TOTAL) BY MOUTH DAILY.  . fluticasone (FLOVENT HFA) 110 MCG/ACT inhaler Inhale 2 puffs into the lungs 2 (two) times daily. Use with spacer. Rinse, gargle and spit out after use  . Fluticasone-Salmeterol (ADVAIR DISKUS) 250-50 MCG/DOSE AEPB Inhale 1 puff into the lungs 2 (two) times daily for 14 days.  . fluticasone-salmeterol (ADVAIR HFA) 115-21 MCG/ACT inhaler Inhale 2 puffs into the lungs as needed.  Marland Kitchen glucose blood (FREESTYLE LITE) test strip Use daily to check blood sugar.  Marland Kitchen JANUVIA 100 MG tablet TAKE 1 TABLET (100 MG TOTAL) BY MOUTH DAILY.  Marland Kitchen Lancets (FREESTYLE) lancets Use daily to check blood sugar  . lidocaine (LIDODERM) 5 % Place 1 patch onto the skin daily. Remove & Discard patch within 12 hours or as directed by MD  . losartan (COZAAR) 25 MG tablet TAKE 1 TABLET (25 MG TOTAL) BY MOUTH DAILY.  . metFORMIN (GLUCOPHAGE) 1000 MG tablet Take 1 tablet (1,000 mg total) by mouth 2 (two) times daily with a meal.  . methylphenidate (RITALIN) 20 MG tablet TAKE 1 TABLET (20 MG TOTAL) BY MOUTH DAILY.  . montelukast (SINGULAIR) 10 MG tablet Take 1 tablet (10 mg total) by mouth daily.  . nitroGLYCERIN (NITROSTAT) 0.4 MG SL tablet Place 1 tablet (0.4 mg total) under the tongue every 5 (five) minutes as needed.  . pravastatin (PRAVACHOL) 20 MG tablet TAKE 1 TABLET (20 MG TOTAL) BY MOUTH DAILY.   Marland Kitchen pregabalin (LYRICA) 75 MG capsule Take 1 capsule (75 mg total) by mouth 2 (two) times daily.  . sildenafil (VIAGRA) 50 MG tablet TAKE 1 TABLET (50 MG TOTAL) BY MOUTH DAILY AS NEEDED FOR ERECTILE DYSFUNCTION.  Marland Kitchen testosterone (ANDROGEL) 50 MG/5GM (1%) GEL APPLY 2 APPLICATIONS (10 GRAMS) ONTO THE SKIN DAILY  . traMADol (ULTRAM) 50 MG tablet Take 1 tablet (50 mg total) by mouth every 12 (twelve) hours as needed.  . [DISCONTINUED] Diclofenac Sodium (PENNSAID) 2 % SOLN Apply 2 g topically 2 (two) times daily as needed (to affected area).     Allergies:   Invokana [canagliflozin], Statins, and Lisinopril   Social History   Socioeconomic History  . Marital status: Married    Spouse name: Not on file  . Number of children: Not on file  . Years of education: Not on file  . Highest education level: Not on  file  Occupational History  . Occupation: Air cabin crew   Tobacco Use  . Smoking status: Never Smoker  . Smokeless tobacco: Never Used  Substance and Sexual Activity  . Alcohol use: Yes    Alcohol/week: 0.0 standard drinks    Comment: rare  . Drug use: No  . Sexual activity: Not on file  Other Topics Concern  . Not on file  Social History Narrative   Previously worked as Geophysicist/field seismologist   Currently working at [employer] clinic   Married 30 years   2 children ages 63, 9   Wife is psychiatrist - Dr. Margurite Auerbach   Moved from [place] up in [place] Determinants of Health   Financial Resource Strain:   . Difficulty of Paying Living Expenses: Not on file  Food Insecurity:   . Worried About Charity fundraiser in the Last Year: Not on file  . Ran Out of Food in the Last Year: Not on file  Transportation Needs:   . Lack of Transportation (Medical): Not on file  . Lack of Transportation (Non-Medical): Not on file  Physical Activity:   . Days of Exercise per Week: Not on file  . Minutes of Exercise per Session: Not on file  Stress:   . Feeling of Stress : Not on  file  Social Connections:   . Frequency of Communication with Friends and Family: Not on file  . Frequency of Social Gatherings with Friends and Family: Not on file  . Attends Religious Services: Not on file  . Active Member of Clubs or Organizations: Not on file  . Attends Archivist Meetings: Not on file  . Marital Status: Not on file     Family History: The patient's family history includes Asthma in his paternal aunt; Cancer in his mother; Coronary artery disease (age of onset: 7) in his father; Food Allergy in his son; Heart disease in his father; Hypothyroidism in his brother; Lung cancer (age of onset: 25) in his mother. There is no history of Colon cancer, Prostate cancer, Allergic rhinitis, Angioedema, Eczema, Immunodeficiency, or Urticaria. ROS:   Please see the history of present illness.    All other systems reviewed and are negative.  EKGs/Labs/Other Studies Reviewed:    The following studies were reviewed today:    Recent Labs: He has a primary care visit tomorrow and I will send a note along to be sure the check CMP and lipid profile Recent Lipid Panel    Component Value Date/Time   CHOL 135 05/25/2018 0920   CHOL 150 04/21/2017 1019   TRIG 78.0 05/25/2018 0920   HDL 45.90 05/25/2018 0920   HDL 53 04/21/2017 1019   CHOLHDL 3 05/25/2018 0920   VLDL 15.6 05/25/2018 0920   LDLCALC 73 05/25/2018 0920   LDLCALC 74 04/21/2017 1019    Physical Exam:    VS:  BP 114/72   Pulse 71   Temp (!) 97.2 F (36.2 C)   Ht '5\' 10"'  (1.778 m)   Wt 198 lb (89.8 kg)   SpO2 99%   BMI 28.41 kg/m     Wt Readings from Last 3 Encounters:  07/10/19 198 lb (89.8 kg)  06/21/19 197 lb (89.4 kg)  06/20/19 197 lb 3.2 oz (89.4 kg)     GEN:  Well nourished, well developed in no acute distress HEENT: Normal NECK: No JVD; No carotid bruits LYMPHATICS: No lymphadenopathy CARDIAC: RRR, no murmurs, rubs, gallops RESPIRATORY:  Clear to  auscultation without rales, wheezing  or rhonchi  ABDOMEN: Soft, non-tender, non-distended MUSCULOSKELETAL:  No edema; No deformity  SKIN: Warm and dry NEUROLOGIC:  Alert and oriented x 3 PSYCHIATRIC:  Normal affect    Signed, Shirlee More, MD  07/10/2019 10:00 AM    [facility]

## 2019-07-10 ENCOUNTER — Ambulatory Visit (INDEPENDENT_AMBULATORY_CARE_PROVIDER_SITE_OTHER): Payer: 59 | Admitting: Cardiology

## 2019-07-10 ENCOUNTER — Encounter: Payer: Self-pay | Admitting: Cardiology

## 2019-07-10 ENCOUNTER — Other Ambulatory Visit: Payer: Self-pay

## 2019-07-10 ENCOUNTER — Other Ambulatory Visit: Payer: Self-pay | Admitting: Family Medicine

## 2019-07-10 VITALS — BP 114/72 | HR 71 | Temp 97.2°F | Ht 70.0 in | Wt 198.0 lb

## 2019-07-10 DIAGNOSIS — E1142 Type 2 diabetes mellitus with diabetic polyneuropathy: Secondary | ICD-10-CM | POA: Diagnosis not present

## 2019-07-10 DIAGNOSIS — I2510 Atherosclerotic heart disease of native coronary artery without angina pectoris: Secondary | ICD-10-CM | POA: Diagnosis not present

## 2019-07-10 DIAGNOSIS — E785 Hyperlipidemia, unspecified: Secondary | ICD-10-CM | POA: Diagnosis not present

## 2019-07-10 DIAGNOSIS — Z794 Long term (current) use of insulin: Secondary | ICD-10-CM | POA: Diagnosis not present

## 2019-07-10 MED ORDER — PANTOPRAZOLE SODIUM 40 MG PO TBEC: 40.0000 mg | DELAYED_RELEASE_TABLET | Freq: Every day | ORAL | 3 refills | Status: DC

## 2019-07-10 MED FILL — PANTOPRAZOLE SOD DR 40 MG T: 40 | 90 days supply | Qty: 90 | Fill #0

## 2019-07-10 MED FILL — FARXIGA 10 MG TABLET: 10 | 30 days supply | Qty: 30 | Fill #3

## 2019-07-10 MED FILL — PRAVASTATIN NA 20 MG TAB: 20 | 90 days supply | Qty: 90 | Fill #0

## 2019-07-10 NOTE — Patient Instructions (Addendum)
Medication Instructions:  Your physician has recommended you make the following change in your medication:  1-START Protonix 40 mg by mouth daily.  *If you need a refill on your cardiac medications before your next appointment, please call your pharmacy*  Lab Work: If you have labs (blood work) drawn today and your tests are completely normal, you will receive your results only by: Marland Kitchen MyChart Message (if you have MyChart) OR . A paper copy in the mail If you have any lab test that is abnormal or we need to change your treatment, we will call you to review the results.  Testing/Procedures: Your physician has requested that you have a lexiscan myoview. For further information please visit [email or web address]. Please follow instruction sheet, as given.  Follow-Up: At [facility], you and your health needs are our priority.  As part of our continuing mission to provide you with exceptional heart care, we have created designated Provider Care Teams.  These Care Teams include your primary Cardiologist (physician) and Advanced Practice Providers (APPs -  Physician Assistants and Nurse Practitioners) who all work together to provide you with the care you need, when you need it.  We recommend signing up for the patient portal called "MyChart".  Sign up information is provided on this After Visit Summary.  MyChart is used to connect with patients for Virtual Visits (Telemedicine).  Patients are able to view lab/test results, encounter notes, upcoming appointments, etc.  Non-urgent messages can be sent to your provider as well.   To learn more about what you can do with MyChart, go to [email or web address].    Your next appointment:   6 month(s)  The format for your next appointment:   In Person  Provider:   You may see Dr. Bettina Gavia or the following Advanced Practice Provider on your designated Care Team:    Laurann Montana, FNP

## 2019-07-11 ENCOUNTER — Ambulatory Visit: Payer: 59 | Admitting: Family Medicine

## 2019-07-11 ENCOUNTER — Encounter: Payer: Self-pay | Admitting: Family Medicine

## 2019-07-11 ENCOUNTER — Other Ambulatory Visit: Payer: Self-pay | Admitting: Family Medicine

## 2019-07-11 ENCOUNTER — Ambulatory Visit: Payer: 59 | Admitting: Orthopaedic Surgery

## 2019-07-11 ENCOUNTER — Other Ambulatory Visit: Payer: Self-pay

## 2019-07-11 VITALS — BP 120/76 | HR 74 | Temp 97.3°F | Ht 70.0 in | Wt 196.2 lb

## 2019-07-11 DIAGNOSIS — F988 Other specified behavioral and emotional disorders with onset usually occurring in childhood and adolescence: Secondary | ICD-10-CM

## 2019-07-11 DIAGNOSIS — N529 Male erectile dysfunction, unspecified: Secondary | ICD-10-CM

## 2019-07-11 DIAGNOSIS — E1165 Type 2 diabetes mellitus with hyperglycemia: Secondary | ICD-10-CM

## 2019-07-11 HISTORY — DX: Male erectile dysfunction, unspecified: N52.9

## 2019-07-11 LAB — LIPID PANEL
Cholesterol: 135 mg/dL (ref 0–200)
HDL: 44.4 mg/dL (ref >39.00)
LDL Cholesterol: 68 mg/dL (ref 0–99)
NonHDL: 90.73
Total CHOL/HDL Ratio: 3
Triglycerides: 116 mg/dL (ref 0.0–149.0)
VLDL: 23.2 mg/dL (ref 0.0–40.0)

## 2019-07-11 LAB — COMPREHENSIVE METABOLIC PANEL
ALT: 21 U/L (ref 0–53)
AST: 14 U/L (ref 0–37)
Albumin: 4.2 g/dL (ref 3.5–5.2)
Alkaline Phosphatase: 80 U/L (ref 39–117)
BUN: 14 mg/dL (ref 6–23)
CO2: 29 mEq/L (ref 19–32)
Calcium: 9.3 mg/dL (ref 8.4–10.5)
Chloride: 103 mEq/L (ref 96–112)
Creatinine, Ser: 0.77 mg/dL (ref 0.40–1.50)
GFR: 101.03 mL/min (ref >60.00)
Glucose, Bld: 168 mg/dL — ABNORMAL HIGH (ref 70–99)
Potassium: 4.1 mEq/L (ref 3.5–5.1)
Sodium: 138 mEq/L (ref 135–145)
Total Bilirubin: 0.4 mg/dL (ref 0.2–1.2)
Total Protein: 7.1 g/dL (ref 6.0–8.3)

## 2019-07-11 LAB — HEMOGLOBIN A1C: Hgb A1c MFr Bld: 8.8 % — ABNORMAL HIGH (ref 4.6–6.5)

## 2019-07-11 MED ORDER — PIOGLITAZONE HCL 30 MG PO TABS: 30.0000 mg | ORAL_TABLET | Freq: Every day | ORAL | 3 refills | Status: DC

## 2019-07-11 MED ORDER — TADALAFIL 20 MG PO TABS: 10.0000 mg | ORAL_TABLET | ORAL | 5 refills | Status: DC | PRN

## 2019-07-11 MED ORDER — METHYLPHENIDATE HCL 20 MG PO TABS: 20.0000 mg | ORAL_TABLET | Freq: Two times a day (BID) | ORAL | 0 refills | Status: DC

## 2019-07-11 MED FILL — TADALAFIL 20 MG TABS: 20 | 30 days supply | Qty: 6 | Fill #0

## 2019-07-11 MED FILL — METHYLPHENIDATE 20 MG TAB: 20 | 30 days supply | Qty: 60 | Fill #0

## 2019-07-11 NOTE — Patient Instructions (Signed)
Give [place] 2-3 business days to get the results of your labs back.   Let me know if the new medicine is not helpful for erectile dysfunction and we can set you up with the urology team.  Keep the diet clean and stay active.  Let us know if you need anything.

## 2019-07-11 NOTE — Progress Notes (Signed)
Subjective:   Chief Complaint  Patient presents with  . Follow-up    labs/blood sugar and change ritalin    Reginald Rios is a 64 y.o. male here for follow-up of diabetes.   Reginald Rios's self monitored glucose range is 150-170's.  Patient denies hypoglycemic reactions. He checks his glucose levels 1 times per day. Patient does not require insulin.   Medications include: metformin 1000 mg bid, Januvia 100 mg/d, Farxiga 10 mg/d Diet is healthy.  Exercise: Walking, not as much tennis 2/2 shoulder/neck pain  Hx of ADD Takes Ritalin daily. Thinks he needs to go back on PM dose. Unable to get anything done.   ED Hx of ED. Loss of sensation and issues with erections. Sildenafil was not helpful. Has not tried anything else.   Past Medical History:  Diagnosis Date  . ADD (attention deficit disorder)   . Anemia   . Arthritis   . CAD (coronary artery disease)    2/19 PCI/DESx1 to Lcx  . Diabetes mellitus without complication ([place])   . Exercise-induced asthma   . Heart murmur   . Hyperlipidemia    family hx of high cholesterol  . Hypogonadism in male   . Low back pain   . Osteoarthritis      Related testing: Date of retinal exam: Don Pneumovax: done Flu Shot: done  Review of Systems: Pulmonary:  No SOB Cardiovascular:  No chest pain  Objective:  BP 120/76 (BP Location: Left Arm, Patient Position: Sitting, Cuff Size: Normal)   Pulse 74   Temp (!) 97.3 F (36.3 C) (Temporal)   Ht 5\' 10"  (1.778 m)   Wt 196 lb 4 oz (89 kg)   SpO2 98%   BMI 28.16 kg/m  General:  Well developed, well nourished, in no apparent distress Skin:  Warm, no pallor or diaphoresis Head:  Normocephalic, atraumatic Eyes:  Pupils equal and round, sclera anicteric without injection  Lungs:  CTAB, no access msc use Cardio:  RRR, no bruits, no LE edema Musculoskeletal:  Symmetrical muscle groups noted without atrophy or deformity Neuro:  Sensation intact to pinprick on feet Psych: Age appropriate  judgment and insight  Assessment:   Type 2 diabetes mellitus with hyperglycemia, without long-term current use of insulin (HCC) - Plan: Comprehensive metabolic panel, Lipid panel, Hemoglobin A1c  Erectile dysfunction, unspecified erectile dysfunction type - Plan: tadalafil (CIALIS) 20 MG tablet  Attention deficit disorder (ADD) without hyperactivity - Plan: methylphenidate (RITALIN) 20 MG tablet   Plan:   1- Cont meds. Counseled on diet and exercise. Will add Actos if no better. <7.5 is goal.  2- Change to Cialis. If no better, will refer to urology. 3- Change to BID.  F/u in 3 mo. The patient voiced understanding and agreement to the plan.  [place], DO 07/11/19 1:32 PM

## 2019-07-12 ENCOUNTER — Telehealth (HOSPITAL_COMMUNITY): Payer: Self-pay

## 2019-07-12 MED FILL — PIOGLITAZONE HCL 30 MG TAB: 30 | 30 days supply | Qty: 30 | Fill #0

## 2019-07-12 NOTE — Telephone Encounter (Signed)
Spoke with the patient, detailed instructions were given. He stated that he would be here for his test. Asked to call back with any questions. ReginaldRios Dani EMTP 

## 2019-07-13 ENCOUNTER — Ambulatory Visit: Payer: 59 | Admitting: Orthopaedic Surgery

## 2019-07-17 MED FILL — JANUVIA 100 MG TABLET: 100 | 30 days supply | Qty: 30 | Fill #1

## 2019-07-18 ENCOUNTER — Other Ambulatory Visit: Payer: Self-pay

## 2019-07-18 ENCOUNTER — Ambulatory Visit: Payer: 59 | Admitting: Physical Medicine & Rehabilitation

## 2019-07-18 ENCOUNTER — Ambulatory Visit (HOSPITAL_COMMUNITY): Payer: 59 | Attending: Cardiology

## 2019-07-18 DIAGNOSIS — E785 Hyperlipidemia, unspecified: Secondary | ICD-10-CM | POA: Diagnosis not present

## 2019-07-18 DIAGNOSIS — Z794 Long term (current) use of insulin: Secondary | ICD-10-CM | POA: Insufficient documentation

## 2019-07-18 DIAGNOSIS — I2510 Atherosclerotic heart disease of native coronary artery without angina pectoris: Secondary | ICD-10-CM | POA: Diagnosis not present

## 2019-07-18 DIAGNOSIS — E1142 Type 2 diabetes mellitus with diabetic polyneuropathy: Secondary | ICD-10-CM | POA: Insufficient documentation

## 2019-07-18 LAB — MYOCARDIAL PERFUSION IMAGING
LV dias vol: 68 mL (ref 62–150)
LV sys vol: 29 mL
Peak HR: 108 {beats}/min
Rest HR: 75 {beats}/min
SDS: 3
SRS: 0
SSS: 3
TID: 1.06

## 2019-07-18 MED ORDER — TECHNETIUM TC 99M TETROFOSMIN IV KIT
10.8000 | PACK | Freq: Once | INTRAVENOUS | Status: AC | PRN
  Administered 2019-07-18: 10.8 via INTRAVENOUS
  Filled 2019-07-18: qty 11

## 2019-07-18 MED ORDER — REGADENOSON 0.4 MG/5ML IV SOLN
0.4000 mg | Freq: Once | INTRAVENOUS | Status: AC
  Administered 2019-07-18: 0.4 mg via INTRAVENOUS

## 2019-07-18 MED ORDER — TECHNETIUM TC 99M TETROFOSMIN IV KIT
30.9000 | PACK | Freq: Once | INTRAVENOUS | Status: AC | PRN
  Administered 2019-07-18: 30.9 via INTRAVENOUS
  Filled 2019-07-18: qty 31

## 2019-07-21 ENCOUNTER — Encounter: Payer: 59 | Admitting: Physical Medicine & Rehabilitation

## 2019-07-21 MED FILL — TESTOSTERONE 50 MG/5GM (1%): 50 MG/5GM | 30 days supply | Qty: 300 | Fill #3

## 2019-07-22 ENCOUNTER — Ambulatory Visit
Admission: RE | Admit: 2019-07-22 | Discharge: 2019-07-22 | Disposition: A | Discharge status: Home / Self Care | Payer: 59 | Source: Ambulatory Visit | Attending: Orthopaedic Surgery | Admitting: Orthopaedic Surgery

## 2019-07-22 ENCOUNTER — Other Ambulatory Visit: Payer: Self-pay

## 2019-07-22 DIAGNOSIS — M25511 Pain in right shoulder: Secondary | ICD-10-CM

## 2019-07-22 DIAGNOSIS — M75111 Incomplete rotator cuff tear or rupture of right shoulder, not specified as traumatic: Secondary | ICD-10-CM | POA: Diagnosis not present

## 2019-07-22 DIAGNOSIS — G8929 Other chronic pain: Secondary | ICD-10-CM

## 2019-07-24 ENCOUNTER — Encounter: Payer: Self-pay | Admitting: Orthopaedic Surgery

## 2019-07-24 ENCOUNTER — Other Ambulatory Visit: Payer: Self-pay | Admitting: Family Medicine

## 2019-07-24 DIAGNOSIS — M5412 Radiculopathy, cervical region: Secondary | ICD-10-CM

## 2019-07-24 DIAGNOSIS — M79601 Pain in right arm: Secondary | ICD-10-CM

## 2019-07-24 MED FILL — PREGABALIN 75 MG CAPS: 75 | 30 days supply | Qty: 60 | Fill #0

## 2019-07-25 ENCOUNTER — Encounter: Payer: Self-pay | Admitting: Orthopaedic Surgery

## 2019-07-25 ENCOUNTER — Other Ambulatory Visit: Payer: Self-pay

## 2019-07-25 ENCOUNTER — Ambulatory Visit (INDEPENDENT_AMBULATORY_CARE_PROVIDER_SITE_OTHER): Payer: 59 | Admitting: Orthopaedic Surgery

## 2019-07-25 DIAGNOSIS — M67921 Unspecified disorder of synovium and tendon, right upper arm: Secondary | ICD-10-CM

## 2019-07-25 DIAGNOSIS — M75111 Incomplete rotator cuff tear or rupture of right shoulder, not specified as traumatic: Secondary | ICD-10-CM

## 2019-07-25 HISTORY — DX: Unspecified disorder of synovium and tendon, right upper arm: M67.921

## 2019-07-25 HISTORY — DX: Incomplete rotator cuff tear or rupture of right shoulder, not specified as traumatic: M75.111

## 2019-07-25 NOTE — Progress Notes (Signed)
Office Visit Note   Patient: Reginald Rios           Date of Birth: May 31, 1955           MRN: [id number] Visit Date: 07/25/2019              Requested by: [name], DO [address] [place],  [place] [address] PCP: [name], DO   Assessment & Plan: Visit Diagnoses:  1. Nontraumatic incomplete tear of right rotator cuff   2. Tendinopathy of right biceps tendon     Plan: MRI of the right shoulder shows moderate tendinopathy of the supraspinatus with a focal area of high-grade partial-thickness tear on the articular surface.  He does have severe tendinopathy of the biceps tendon as well.  The [facility] joint is uninvolved in my opinion.  Based on these MRI findings and recommendation has been made for arthroscopic shoulder surgery with extensive debridement, biceps tenotomy, rotator cuff debridement, subacromial decompression.  I do not feel that he needs a distal clavicle excision.  Reginald Rios is very active and unfortunately the right shoulder has significantly limited his ability to do so.  Based on our discussion risk benefits alternatives Reginald Rios has elected to proceed with the surgery.  We will obtain preoperative cardiac clearance from [place].  He will need to stop Plavix 7 days in advance of the surgery.  Questions encouraged and answered. Total face to face encounter time was greater than 25 minutes and over half of this time was spent in counseling and/or coordination of care.  Follow-Up Instructions: Return if symptoms worsen or fail to improve.   Orders:  No orders of the defined types were placed in this encounter.  No orders of the defined types were placed in this encounter.     Procedures: No procedures performed   Clinical Data: No additional findings.   Subjective: Chief Complaint  Patient presents with  . Right Shoulder - Follow-up    Reginald Rios returns today for MRI review.   Review of Systems   Objective: Vital  Signs: There were no vitals taken for this visit.  Physical Exam  Ortho Exam Right shoulder shows pain with elevation and internal rotation.  Positive Hawkins and Neer impingement.  Severe pain with manual muscle testing of supraspinatus.  Positive Speed.  Negative cross body adduction Specialty Comments:  No specialty comments available.  Imaging: No results found.   PMFS History: Patient Active Problem List   Diagnosis Date Noted  . Nontraumatic incomplete tear of right rotator cuff 07/25/2019  . Tendinopathy of right biceps tendon 07/25/2019  . Erectile dysfunction 07/11/2019  . Mild persistent asthma/cough variant asthma 04/13/2019  . Allergic rhinitis with a possible nonallergic component 04/13/2019  . Acid reflux 04/13/2019  . Cough 04/13/2019  . Hoarseness 04/13/2019  . Cervical radicular pain 01/10/2019  . Ulnar neuropathy at elbow of right upper extremity 01/03/2019  . Acute bilateral low back pain without sciatica 01/03/2019  . Onychomycosis 05/25/2018  . Palpitations 07/06/2017  . CAD (coronary artery disease) 07/05/2017  . Family history of early CAD 07/05/2017  . Abnormal findings on diagnostic imaging of cardiovascular system 06/23/2017  . Low vitamin D level 04/22/2017  . Pain in right ankle and joints of right foot 10/05/2016  . Hemarthrosis of right elbow 02/03/2016  . Small thenar eminence 02/03/2016  . Cervical disc disorder with radiculopathy of cervical region 10/07/2015  . Incomplete rotator cuff tear 09/10/2015  . Nonischemic cardiomyopathy ([place])  05/15/2015  . Fatigue 05/15/2015  . Dyspnea on exertion 05/15/2015  . Decreased cardiac ejection fraction 04/02/2015  . Strain of latissimus dorsi muscle 01/21/2015  . Shoulder bursitis 10/12/2014  . Stenosis of lateral recess of lumbar spine 10/12/2014  . BPPV (benign paroxysmal positional vertigo) 08/14/2014  . Hyperlipidemia 01/19/2014  . Anemia, unspecified 01/19/2014  . ADD (attention deficit  disorder) 01/19/2014  . Type 2 diabetes mellitus with hyperglycemia, without long-term current use of insulin ([place]) 12/22/2013  . Hypogonadism in male 12/22/2013  . Arthritis of hand, left 12/22/2013   Past Medical History:  Diagnosis Date  . ADD (attention deficit disorder)   . Anemia   . Arthritis   . CAD (coronary artery disease)    2/19 PCI/DESx1 to Lcx  . Diabetes mellitus without complication ([place])   . Exercise-induced asthma   . Heart murmur   . Hyperlipidemia    family hx of high cholesterol  . Hypogonadism in male   . Low back pain   . Osteoarthritis     Family History  Problem Relation Age of Onset  . Lung cancer Mother 63  . Cancer Mother        lung cancer  . Coronary artery disease Father 14  . Heart disease Father   . Hypothyroidism Brother   . Food Allergy Son   . Asthma Paternal Aunt   . Colon cancer Neg Hx   . Prostate cancer Neg Hx   . Allergic rhinitis Neg Hx   . Angioedema Neg Hx   . Eczema Neg Hx   . Immunodeficiency Neg Hx   . Urticaria Neg Hx     Past Surgical History:  Procedure Laterality Date  . CORONARY STENT INTERVENTION N/A 06/23/2017   Procedure: CORONARY STENT INTERVENTION;  Surgeon: Leonie Man, MD;  Location: [facility] [facility];  Service: Cardiovascular;  Laterality: N/A;  . INTRAVASCULAR PRESSURE WIRE/FFR STUDY N/A 06/23/2017   Procedure: INTRAVASCULAR PRESSURE WIRE/FFR STUDY;  Surgeon: Leonie Man, MD;  Location: [facility];  Service: Cardiovascular;  Laterality: N/A;  . KNEE ARTHROSCOPY    . LEFT HEART CATH AND CORONARY ANGIOGRAPHY N/A 06/23/2017   Procedure: LEFT HEART CATH AND CORONARY ANGIOGRAPHY;  Surgeon: Leonie Man, MD;  Location: [facility];  Service: Cardiovascular;  Laterality: N/A;  . TENOTOMY ACHILLES TENDON     Social History   Occupational History  . Occupation: Air cabin crew   Tobacco Use  . Smoking status: Never Smoker  . Smokeless tobacco: Never Used  Substance and Sexual  Activity  . Alcohol use: Yes    Alcohol/week: 0.0 standard drinks    Comment: rare  . Drug use: No  . Sexual activity: Not on file

## 2019-07-27 ENCOUNTER — Telehealth: Payer: Self-pay

## 2019-07-27 NOTE — Telephone Encounter (Signed)
   Primary Cardiologist: Shirlee More, MD  Chart reviewed as part of pre-operative protocol coverage. Reginald Rios was seen by Dr. Bettina Gavia on 07/10/2019. He has stable CAD and had a normal myocardial perfusion study on 07/18/2019.  Therefore, based on ACC/AHA guidelines, the patient would be at acceptable risk for the planned procedure without further cardiovascular testing.   Aspirin and Plavix can be held if needed for the surgery.   I will route this recommendation to the requesting party via Epic fax function and remove from pre-op pool.  Please call with questions.  Daune Perch, NP 07/27/2019, 4:45 PM

## 2019-07-27 NOTE — Telephone Encounter (Signed)
   [facility] Pre-operative Risk Assessment    Request for surgical clearance:  1. What type of surgery is being performed? Right Shoulder Arthroscopy   2. When is this surgery scheduled? TBD   3. What type of clearance is required (medical clearance vs. Pharmacy clearance to hold med vs. Both)? Medical/Pharmacy clearance  4. Are there any medications that need to be held prior to surgery and how long? Possibly Plavix?   5. Practice name and name of physician performing surgery? [facility]   6. What is your office phone number [phone]    7.   What is your office fax number [phone]  8.   Anesthesia type (None, local, MAC, general) ? General and Block   Gita Kudo 07/27/2019, 12:00 PM  _________________________________________________________________   (provider comments below)

## 2019-07-28 ENCOUNTER — Other Ambulatory Visit: Payer: Self-pay

## 2019-07-28 ENCOUNTER — Encounter: Payer: 59 | Attending: Physical Medicine & Rehabilitation | Admitting: Physical Medicine & Rehabilitation

## 2019-07-28 ENCOUNTER — Encounter: Payer: Self-pay | Admitting: Physical Medicine & Rehabilitation

## 2019-07-28 VITALS — BP 133/77 | HR 87 | Temp 97.5°F | Ht 70.0 in | Wt 197.0 lb

## 2019-07-28 DIAGNOSIS — M7711 Lateral epicondylitis, right elbow: Secondary | ICD-10-CM

## 2019-07-28 DIAGNOSIS — M7501 Adhesive capsulitis of right shoulder: Secondary | ICD-10-CM | POA: Diagnosis not present

## 2019-07-28 DIAGNOSIS — M24511 Contracture, right shoulder: Secondary | ICD-10-CM | POA: Insufficient documentation

## 2019-07-28 DIAGNOSIS — M542 Cervicalgia: Secondary | ICD-10-CM | POA: Diagnosis not present

## 2019-07-28 DIAGNOSIS — M25521 Pain in right elbow: Secondary | ICD-10-CM | POA: Insufficient documentation

## 2019-07-28 DIAGNOSIS — G5621 Lesion of ulnar nerve, right upper limb: Secondary | ICD-10-CM | POA: Insufficient documentation

## 2019-07-28 DIAGNOSIS — M5412 Radiculopathy, cervical region: Secondary | ICD-10-CM | POA: Diagnosis not present

## 2019-07-28 DIAGNOSIS — M7918 Myalgia, other site: Secondary | ICD-10-CM

## 2019-07-28 DIAGNOSIS — G8929 Other chronic pain: Secondary | ICD-10-CM | POA: Insufficient documentation

## 2019-07-28 HISTORY — DX: Myalgia, other site: M79.18

## 2019-07-28 HISTORY — DX: Lateral epicondylitis, right elbow: M77.11

## 2019-07-28 NOTE — Patient Instructions (Signed)
Acupuncture Acupuncture is a type of treatment that involves stimulating specific points on your body by inserting thin needles through your skin. Acupuncture is often used to treat pain, but it may also be used to help relieve other types of symptoms. Your health care provider may recommend acupuncture to help treat various conditions, such as:  Migraine headaches.  Tension headaches.  Arthritis pain.  Addiction.  Chronic pain.  Nausea and vomiting after a surgery.  High blood pressure (hypertension).  Chronic obstructive pulmonary disease (COPD).  Nausea caused by cancer treatment.  Sudden or severe (acute) pain. Acupuncture is based on traditional [place] medicine, which recognizes more than 2,000 points on the body that connect energy pathways (meridians) through the body. The goal in stimulating these points is to balance the physical, emotional, and mental energy in your body. Acupuncture is done by a health care provider who has specialized training (licensed acupuncture practitioner). Treatment often requires several acupuncture sessions. You may have acupuncture along with other medical treatments. Tell a health care provider about:  Any allergies you have.  All medicines you are taking, including vitamins, herbs, eye drops, creams, and over-the-counter medicines.  Any blood disorders you have.  Any surgeries you have had.  Any medical conditions you have.  Whether you are pregnant or may be pregnant. What are the risks? Generally, this is a safe treatment. However, problems may occur, including:  Skin infection.  Damage to organs or structures that are under the skin where a needle is placed. What happens before the treatment?  Your acupuncture practitioner will ask about your medical history and your symptoms.  You may have a physical exam. What happens during the treatment? The exact procedure will depend on your condition and how your acupuncture provider  treats it. In general:  Your skin will be cleaned with a germ-killing (antiseptic) solution.  Your acupuncture practitioner will open a new set of germ-free (sterile) needles.  The needles will be gently inserted into your skin. They will be left in place for a certain amount of time. You may feel slight pain or a tingling sensation.  Your acupuncture practitioner may: ? Apply electrical energy to the needles. ? Adjust the needles in certain ways.  After your procedure, the acupuncture practitioner will remove the needles, throw them away, and clean your skin. The procedure may vary among health care providers. What can I expect after the treatment? People react differently to acupuncture. Make sure you ask your acupuncture provider what to expect after your treatment. It is common to have:  Minor bruising.  Mild pain.  A small amount of bleeding. Follow these instructions at home:  Follow any instructions given by your provider after the treatment.  Keep all follow-up visits as told by your health care provider. This is important. Contact a health care provider if:  You have questions about your reaction to the treatment.  You have soreness.  You have skin irritation or redness.  You have a fever. Summary  Acupuncture is a type of treatment that involves stimulating specific points on your body by inserting thin needles through your skin.  This treatment is often used to treat pain, but it may also be used to help relieve other types of symptoms.  The exact procedure will depend on your condition and how your acupuncture provider treats it. This information is not intended to replace advice given to you by your health care provider. Make sure you discuss any questions you have with your health  care provider. Document Revised: 04/02/2017 Document Reviewed: 01/15/2017 Elsevier Patient Education  2020 Elsevier Inc.  

## 2019-07-28 NOTE — Progress Notes (Signed)
Acupuncture visit #1 Indication myofascial pain bilateral upper parascapular, lateral epicondylitis right  Patient placed in a prone position.  Acupuncture needles, 40 mm placed at the following points  Left GB 20, left GB 21, electrical stimulation 2 Hz x 30 minutes  Right GB 20, right LI 14, electrical stimulation 2 Hz x 30 minutes  Right LI 12, right LI 10, electrical stimulation 2 Hz x 30 minutes  Right TH- 8  Repeat tx in 1 wk

## 2019-08-01 ENCOUNTER — Other Ambulatory Visit: Payer: Self-pay | Admitting: Cardiology

## 2019-08-01 MED FILL — LOSARTAN POTASSIUM 25 MG TA: 25 | 90 days supply | Qty: 90 | Fill #0

## 2019-08-07 ENCOUNTER — Encounter: Payer: 59 | Admitting: Physical Medicine & Rehabilitation

## 2019-08-11 MED FILL — FARXIGA 10 MG TABLET: 10 | 30 days supply | Qty: 30 | Fill #4

## 2019-08-14 MED FILL — PIOGLITAZONE HCL 30 MG TAB: 30 | 30 days supply | Qty: 30 | Fill #1

## 2019-08-14 MED FILL — JANUVIA 100 MG TABLET: 100 | 30 days supply | Qty: 30 | Fill #2

## 2019-08-17 MED FILL — CLOPIDOGREL 75 MG TABLET: 75 | 90 days supply | Qty: 90 | Fill #2

## 2019-08-23 ENCOUNTER — Other Ambulatory Visit: Payer: Self-pay

## 2019-08-28 MED FILL — PREGABALIN 75 MG CAPS: 75 | 30 days supply | Qty: 60 | Fill #1

## 2019-08-29 MED FILL — TESTOSTERONE 50 MG/5GM (1%): 50 MG/5GM | 30 days supply | Qty: 300 | Fill #4

## 2019-08-30 ENCOUNTER — Encounter (HOSPITAL_BASED_OUTPATIENT_CLINIC_OR_DEPARTMENT_OTHER): Payer: Self-pay | Admitting: Orthopaedic Surgery

## 2019-08-30 ENCOUNTER — Other Ambulatory Visit: Payer: Self-pay

## 2019-08-30 ENCOUNTER — Encounter (HOSPITAL_BASED_OUTPATIENT_CLINIC_OR_DEPARTMENT_OTHER)
Admission: RE | Admit: 2019-08-30 | Discharge: 2019-08-30 | Disposition: A | Discharge status: Home / Self Care | Payer: 59 | Source: Ambulatory Visit | Attending: Orthopaedic Surgery | Admitting: Orthopaedic Surgery

## 2019-08-30 DIAGNOSIS — Z01812 Encounter for preprocedural laboratory examination: Secondary | ICD-10-CM | POA: Insufficient documentation

## 2019-08-30 LAB — BASIC METABOLIC PANEL
Anion gap: 10 (ref 5–15)
BUN: 14 mg/dL (ref 8–23)
CO2: 23 mmol/L (ref 22–32)
Calcium: 9.4 mg/dL (ref 8.9–10.3)
Chloride: 107 mmol/L (ref 98–111)
Creatinine, Ser: 0.8 mg/dL (ref 0.61–1.24)
GFR calc Af Amer: >60 mL/min (ref >60)
GFR calc non Af Amer: >60 mL/min (ref >60)
Glucose, Bld: 165 mg/dL — ABNORMAL HIGH (ref 70–99)
Potassium: 4.6 mmol/L (ref 3.5–5.1)
Sodium: 140 mmol/L (ref 135–145)

## 2019-08-30 NOTE — Progress Notes (Signed)

## 2019-09-01 ENCOUNTER — Other Ambulatory Visit: Payer: Self-pay | Admitting: Cardiology

## 2019-09-01 MED FILL — EZETIMIBE 10 MG TABS: 10 | 90 days supply | Qty: 90 | Fill #0

## 2019-09-02 ENCOUNTER — Other Ambulatory Visit (HOSPITAL_COMMUNITY)
Admission: RE | Admit: 2019-09-02 | Discharge: 2019-09-02 | Disposition: A | Discharge status: Home / Self Care | Payer: 59 | Source: Ambulatory Visit | Attending: Orthopaedic Surgery | Admitting: Orthopaedic Surgery

## 2019-09-02 DIAGNOSIS — Z01812 Encounter for preprocedural laboratory examination: Secondary | ICD-10-CM | POA: Insufficient documentation

## 2019-09-02 DIAGNOSIS — Z20822 Contact with and (suspected) exposure to covid-19: Secondary | ICD-10-CM | POA: Insufficient documentation

## 2019-09-02 LAB — SARS CORONAVIRUS 2 (TAT 6-24 HRS): SARS Coronavirus 2: NEGATIVE

## 2019-09-05 NOTE — Anesthesia Preprocedure Evaluation (Addendum)
Anesthesia Evaluation  Patient identified by MRN, date of birth, ID band Patient awake    Reviewed: Allergy & Precautions, NPO status , Patient's Chart, lab work & pertinent test results  History of Anesthesia Complications Negative for: history of anesthetic complications  Airway Mallampati: II  TM Distance: >3 FB Neck ROM: Full    Dental no notable dental hx. (+) Dental Advisory Given   Pulmonary asthma ,    Pulmonary exam normal        Cardiovascular hypertension, Pt. on medications + CAD and + Cardiac Stents  Normal cardiovascular exam  Study Highlights   Nuclear stress EF: 57%.  There was no ST segment deviation noted during stress.  The study is normal.  This is a low risk study.  The left ventricular ejection fraction is normal (55-65%).       Neuro/Psych negative neurological ROS     GI/Hepatic Neg liver ROS, GERD  ,  Endo/Other  diabetes  Renal/GU negative Renal ROS     Musculoskeletal negative musculoskeletal ROS (+)   Abdominal   Peds  Hematology negative hematology ROS (+)   Anesthesia Other Findings Day of surgery medications reviewed with the patient.  Reproductive/Obstetrics                            Anesthesia Physical Anesthesia Plan  ASA: III  Anesthesia Plan: General   Post-op Pain Management: GA combined w/ Regional for post-op pain   Induction: Intravenous  PONV Risk Score and Plan: 2 and Ondansetron and Dexamethasone  Airway Management Planned: Oral ETT  Additional Equipment:   Intra-op Plan:   Post-operative Plan: Extubation in OR  Informed Consent: I have reviewed the patients History and Physical, chart, labs and discussed the procedure including the risks, benefits and alternatives for the proposed anesthesia with the patient or authorized representative who has indicated his/her understanding and acceptance.     Dental advisory  given  Plan Discussed with: Anesthesiologist and CRNA  Anesthesia Plan Comments:        Anesthesia Quick Evaluation

## 2019-09-06 ENCOUNTER — Encounter: Payer: Self-pay | Admitting: Orthopaedic Surgery

## 2019-09-06 ENCOUNTER — Encounter (HOSPITAL_BASED_OUTPATIENT_CLINIC_OR_DEPARTMENT_OTHER)
Admission: RE | Disposition: A | Discharge status: Home / Self Care | Payer: Self-pay | Source: Home / Self Care | Attending: Orthopaedic Surgery

## 2019-09-06 ENCOUNTER — Ambulatory Visit (HOSPITAL_BASED_OUTPATIENT_CLINIC_OR_DEPARTMENT_OTHER): Payer: 59 | Admitting: Anesthesiology

## 2019-09-06 ENCOUNTER — Other Ambulatory Visit: Payer: Self-pay

## 2019-09-06 ENCOUNTER — Encounter (HOSPITAL_BASED_OUTPATIENT_CLINIC_OR_DEPARTMENT_OTHER): Payer: Self-pay | Admitting: Orthopaedic Surgery

## 2019-09-06 ENCOUNTER — Ambulatory Visit (HOSPITAL_BASED_OUTPATIENT_CLINIC_OR_DEPARTMENT_OTHER)
Admission: RE | Admit: 2019-09-06 | Discharge: 2019-09-06 | Disposition: A | Discharge status: Home / Self Care | Payer: 59 | Attending: Orthopaedic Surgery | Admitting: Orthopaedic Surgery

## 2019-09-06 DIAGNOSIS — Z955 Presence of coronary angioplasty implant and graft: Secondary | ICD-10-CM | POA: Diagnosis not present

## 2019-09-06 DIAGNOSIS — G8918 Other acute postprocedural pain: Secondary | ICD-10-CM | POA: Diagnosis not present

## 2019-09-06 DIAGNOSIS — Z79899 Other long term (current) drug therapy: Secondary | ICD-10-CM | POA: Insufficient documentation

## 2019-09-06 DIAGNOSIS — M75111 Incomplete rotator cuff tear or rupture of right shoulder, not specified as traumatic: Secondary | ICD-10-CM | POA: Insufficient documentation

## 2019-09-06 DIAGNOSIS — Z7951 Long term (current) use of inhaled steroids: Secondary | ICD-10-CM | POA: Diagnosis not present

## 2019-09-06 DIAGNOSIS — Z7984 Long term (current) use of oral hypoglycemic drugs: Secondary | ICD-10-CM | POA: Diagnosis not present

## 2019-09-06 DIAGNOSIS — N529 Male erectile dysfunction, unspecified: Secondary | ICD-10-CM | POA: Insufficient documentation

## 2019-09-06 DIAGNOSIS — M24111 Other articular cartilage disorders, right shoulder: Secondary | ICD-10-CM | POA: Diagnosis not present

## 2019-09-06 DIAGNOSIS — I2510 Atherosclerotic heart disease of native coronary artery without angina pectoris: Secondary | ICD-10-CM | POA: Diagnosis not present

## 2019-09-06 DIAGNOSIS — M1990 Unspecified osteoarthritis, unspecified site: Secondary | ICD-10-CM | POA: Insufficient documentation

## 2019-09-06 DIAGNOSIS — E785 Hyperlipidemia, unspecified: Secondary | ICD-10-CM | POA: Diagnosis not present

## 2019-09-06 DIAGNOSIS — E119 Type 2 diabetes mellitus without complications: Secondary | ICD-10-CM | POA: Diagnosis not present

## 2019-09-06 DIAGNOSIS — Z7902 Long term (current) use of antithrombotics/antiplatelets: Secondary | ICD-10-CM | POA: Insufficient documentation

## 2019-09-06 DIAGNOSIS — M7541 Impingement syndrome of right shoulder: Secondary | ICD-10-CM | POA: Diagnosis not present

## 2019-09-06 DIAGNOSIS — M67921 Unspecified disorder of synovium and tendon, right upper arm: Secondary | ICD-10-CM | POA: Diagnosis present

## 2019-09-06 DIAGNOSIS — Z8349 Family history of other endocrine, nutritional and metabolic diseases: Secondary | ICD-10-CM | POA: Diagnosis not present

## 2019-09-06 DIAGNOSIS — J45990 Exercise induced bronchospasm: Secondary | ICD-10-CM | POA: Diagnosis not present

## 2019-09-06 DIAGNOSIS — Z888 Allergy status to other drugs, medicaments and biological substances status: Secondary | ICD-10-CM | POA: Diagnosis not present

## 2019-09-06 DIAGNOSIS — M7521 Bicipital tendinitis, right shoulder: Secondary | ICD-10-CM | POA: Diagnosis not present

## 2019-09-06 DIAGNOSIS — M7551 Bursitis of right shoulder: Secondary | ICD-10-CM | POA: Diagnosis not present

## 2019-09-06 DIAGNOSIS — Z7982 Long term (current) use of aspirin: Secondary | ICD-10-CM | POA: Insufficient documentation

## 2019-09-06 HISTORY — PX: SHOULDER ARTHROSCOPY WITH SUBACROMIAL DECOMPRESSION: SHX5684

## 2019-09-06 HISTORY — PX: SHOULDER ARTHROSCOPY WITH BICEPSTENOTOMY: SHX6204

## 2019-09-06 LAB — GLUCOSE, CAPILLARY
Glucose-Capillary: 106 mg/dL — ABNORMAL HIGH (ref 70–99)
Glucose-Capillary: 125 mg/dL — ABNORMAL HIGH (ref 70–99)

## 2019-09-06 SURGERY — SHOULDER ARTHROSCOPY WITH SUBACROMIAL DECOMPRESSION
Anesthesia: General | Site: Shoulder | Laterality: Right

## 2019-09-06 MED ORDER — PROMETHAZINE HCL 25 MG/ML IJ SOLN
INTRAMUSCULAR | Status: AC
  Filled 2019-09-06: qty 1

## 2019-09-06 MED ORDER — EPHEDRINE SULFATE-NACL 50-0.9 MG/10ML-% IV SOSY
PREFILLED_SYRINGE | INTRAVENOUS | Status: DC | PRN
  Administered 2019-09-06: 5 mg via INTRAVENOUS
  Administered 2019-09-06: 2.5 mg via INTRAVENOUS

## 2019-09-06 MED ORDER — FENTANYL CITRATE (PF) 100 MCG/2ML IJ SOLN
INTRAMUSCULAR | Status: AC
  Filled 2019-09-06: qty 2

## 2019-09-06 MED ORDER — PROPOFOL 10 MG/ML IV BOLUS
INTRAVENOUS | Status: AC
  Filled 2019-09-06: qty 40

## 2019-09-06 MED ORDER — FENTANYL CITRATE (PF) 100 MCG/2ML IJ SOLN
25.0000 ug | INTRAMUSCULAR | Status: DC | PRN
  Administered 2019-09-06: 50 ug via INTRAVENOUS

## 2019-09-06 MED ORDER — ONDANSETRON HCL 4 MG/2ML IJ SOLN
INTRAMUSCULAR | Status: AC
  Filled 2019-09-06: qty 2

## 2019-09-06 MED ORDER — PHENYLEPHRINE HCL (PRESSORS) 10 MG/ML IV SOLN
INTRAVENOUS | Status: AC
  Filled 2019-09-06: qty 2

## 2019-09-06 MED ORDER — CEFAZOLIN SODIUM-DEXTROSE 2-4 GM/100ML-% IV SOLN
2.0000 g | INTRAVENOUS | Status: AC
  Administered 2019-09-06: 2 g via INTRAVENOUS

## 2019-09-06 MED ORDER — DEXAMETHASONE SODIUM PHOSPHATE 10 MG/ML IJ SOLN
INTRAMUSCULAR | Status: AC
  Filled 2019-09-06: qty 1

## 2019-09-06 MED ORDER — FENTANYL CITRATE (PF) 100 MCG/2ML IJ SOLN
50.0000 ug | INTRAMUSCULAR | Status: DC | PRN
  Administered 2019-09-06: 100 ug via INTRAVENOUS

## 2019-09-06 MED ORDER — SODIUM CHLORIDE 0.9 % IR SOLN
Status: DC | PRN
  Administered 2019-09-06: 9000 mL

## 2019-09-06 MED ORDER — EPINEPHRINE PF 1 MG/ML IJ SOLN
INTRAMUSCULAR | Status: DC | PRN
  Administered 2019-09-06: 1 mg

## 2019-09-06 MED ORDER — LACTATED RINGERS IV SOLN: INTRAVENOUS | Status: DC

## 2019-09-06 MED ORDER — ROCURONIUM BROMIDE 10 MG/ML (PF) SYRINGE
PREFILLED_SYRINGE | INTRAVENOUS | Status: DC | PRN
  Administered 2019-09-06: 80 mg via INTRAVENOUS

## 2019-09-06 MED ORDER — ROCURONIUM BROMIDE 10 MG/ML (PF) SYRINGE
PREFILLED_SYRINGE | INTRAVENOUS | Status: AC
  Filled 2019-09-06: qty 10

## 2019-09-06 MED ORDER — MIDAZOLAM HCL 2 MG/2ML IJ SOLN
1.0000 mg | INTRAMUSCULAR | Status: DC | PRN
  Administered 2019-09-06: 2 mg via INTRAVENOUS

## 2019-09-06 MED ORDER — PHENYLEPHRINE HCL-NACL 10-0.9 MG/250ML-% IV SOLN
INTRAVENOUS | Status: DC | PRN
  Administered 2019-09-06: 50 ug/min via INTRAVENOUS

## 2019-09-06 MED ORDER — EPINEPHRINE PF 1 MG/ML IJ SOLN
INTRAMUSCULAR | Status: AC
  Filled 2019-09-06: qty 1

## 2019-09-06 MED ORDER — PROMETHAZINE HCL 25 MG/ML IJ SOLN
6.2500 mg | INTRAMUSCULAR | Status: DC | PRN
  Administered 2019-09-06: 6.25 mg via INTRAVENOUS

## 2019-09-06 MED ORDER — BUPIVACAINE HCL (PF) 0.5 % IJ SOLN
INTRAMUSCULAR | Status: DC | PRN
  Administered 2019-09-06: 15 mL

## 2019-09-06 MED ORDER — ACETAMINOPHEN 500 MG PO TABS
ORAL_TABLET | ORAL | Status: AC
  Filled 2019-09-06: qty 2

## 2019-09-06 MED ORDER — CELECOXIB 200 MG PO CAPS
ORAL_CAPSULE | ORAL | Status: AC
  Filled 2019-09-06: qty 1

## 2019-09-06 MED ORDER — MIDAZOLAM HCL 2 MG/2ML IJ SOLN
INTRAMUSCULAR | Status: AC
  Filled 2019-09-06: qty 2

## 2019-09-06 MED ORDER — BUPIVACAINE LIPOSOME 1.3 % IJ SUSP
INTRAMUSCULAR | Status: DC | PRN
  Administered 2019-09-06: 10 mL

## 2019-09-06 MED ORDER — OXYCODONE-ACETAMINOPHEN 5-325 MG PO TABS: 1.0000 | ORAL_TABLET | Freq: Three times a day (TID) | ORAL | 0 refills | Status: DC | PRN

## 2019-09-06 MED ORDER — ONDANSETRON HCL 4 MG PO TABS: 4.0000 mg | ORAL_TABLET | Freq: Three times a day (TID) | ORAL | 0 refills | Status: DC | PRN

## 2019-09-06 MED ORDER — LIDOCAINE 2% (20 MG/ML) 5 ML SYRINGE
INTRAMUSCULAR | Status: AC
  Filled 2019-09-06: qty 5

## 2019-09-06 MED ORDER — SUGAMMADEX SODIUM 200 MG/2ML IV SOLN
INTRAVENOUS | Status: DC | PRN
  Administered 2019-09-06: 180 mg via INTRAVENOUS

## 2019-09-06 MED ORDER — ONDANSETRON HCL 4 MG/2ML IJ SOLN
INTRAMUSCULAR | Status: DC | PRN
  Administered 2019-09-06: 4 mg via INTRAVENOUS

## 2019-09-06 MED ORDER — CEFAZOLIN SODIUM-DEXTROSE 2-4 GM/100ML-% IV SOLN
INTRAVENOUS | Status: AC
  Filled 2019-09-06: qty 100

## 2019-09-06 MED ORDER — PROPOFOL 10 MG/ML IV BOLUS
INTRAVENOUS | Status: DC | PRN
  Administered 2019-09-06: 200 mg via INTRAVENOUS

## 2019-09-06 MED ORDER — EPHEDRINE 5 MG/ML INJ
INTRAVENOUS | Status: AC
  Filled 2019-09-06: qty 10

## 2019-09-06 MED ORDER — ACETAMINOPHEN 500 MG PO TABS
1000.0000 mg | ORAL_TABLET | Freq: Once | ORAL | Status: AC
  Administered 2019-09-06: 1000 mg via ORAL

## 2019-09-06 MED ORDER — CELECOXIB 200 MG PO CAPS: 200.0000 mg | ORAL_CAPSULE | Freq: Once | ORAL | Status: DC

## 2019-09-06 MED ORDER — DEXAMETHASONE SODIUM PHOSPHATE 10 MG/ML IJ SOLN
INTRAMUSCULAR | Status: DC | PRN
  Administered 2019-09-06: 7 mg via INTRAVENOUS

## 2019-09-06 MED FILL — OXYCODONE-ACETAMINOPHEN 5-3: 5-325 | 5 days supply | Qty: 30 | Fill #0

## 2019-09-06 MED FILL — ONDANSETRON HCL 4 MG TABLET: 4 | 3 days supply | Qty: 20 | Fill #0

## 2019-09-06 SURGICAL SUPPLY — 68 items
BENZOIN TINCTURE PRP APPL 2/3 (GAUZE/BANDAGES/DRESSINGS) IMPLANT
BLADE EXCALIBUR 4.0X13 (MISCELLANEOUS) ×2 IMPLANT
BLADE SURG 15 STRL LF DISP TIS (BLADE) IMPLANT
BLADE SURG 15 STRL SS (BLADE)
BURR OVAL 8 FLU 4.0X13 (MISCELLANEOUS) ×1 IMPLANT
CANNULA 5.75X71 LONG (CANNULA) ×2 IMPLANT
CANNULA SHOULDER 7CM (CANNULA) ×2 IMPLANT
CANNULA TWIST IN 8.25X7CM (CANNULA) IMPLANT
CANNULA TWIST IN 8.25X9CM (CANNULA) IMPLANT
CLSR STERI-STRIP ANTIMIC 1/2X4 (GAUZE/BANDAGES/DRESSINGS) IMPLANT
COVER WAND RF STERILE (DRAPES) IMPLANT
DECANTER SPIKE VIAL GLASS SM (MISCELLANEOUS) IMPLANT
DISSECTOR  3.8MM X 13CM (MISCELLANEOUS) ×1
DISSECTOR 3.8MM X 13CM (MISCELLANEOUS) ×1 IMPLANT
DRAPE IMP U-DRAPE 54X76 (DRAPES) ×2 IMPLANT
DRAPE INCISE IOBAN 66X45 STRL (DRAPES) ×2 IMPLANT
DRAPE STERI 35X30 U-POUCH (DRAPES) ×2 IMPLANT
DRAPE SURG 17X23 STRL (DRAPES) ×2 IMPLANT
DRAPE U-SHAPE 47X51 STRL (DRAPES) ×2 IMPLANT
DRAPE U-SHAPE 76X120 STRL (DRAPES) ×4 IMPLANT
DRSG PAD ABDOMINAL 8X10 ST (GAUZE/BANDAGES/DRESSINGS) ×4 IMPLANT
DURAPREP 26ML APPLICATOR (WOUND CARE) ×4 IMPLANT
DW OUTFLOW CASSETTE/TUBE SET (MISCELLANEOUS) IMPLANT
ELECT REM PT RETURN 9FT ADLT (ELECTROSURGICAL)
ELECTRODE REM PT RTRN 9FT ADLT (ELECTROSURGICAL) IMPLANT
GAUZE SPONGE 4X4 12PLY STRL (GAUZE/BANDAGES/DRESSINGS) ×4 IMPLANT
GAUZE XEROFORM 1X8 LF (GAUZE/BANDAGES/DRESSINGS) ×2 IMPLANT
GLOVE BIOGEL PI IND STRL 7.0 (GLOVE) ×1 IMPLANT
GLOVE BIOGEL PI INDICATOR 7.0 (GLOVE) ×3
GLOVE ECLIPSE 6.5 STRL STRAW (GLOVE) ×1 IMPLANT
GLOVE ECLIPSE 7.0 STRL STRAW (GLOVE) ×1 IMPLANT
GLOVE ECLIPSE 7.5 STRL STRAW (GLOVE) ×2 IMPLANT
GLOVE SKINSENSE NS SZ7.5 (GLOVE) ×2
GLOVE SKINSENSE STRL SZ7.5 (GLOVE) ×1 IMPLANT
GLOVE SURG SYN 7.5  E (GLOVE) ×2
GLOVE SURG SYN 7.5 E (GLOVE) ×2 IMPLANT
GLOVE SURG SYN 7.5 PF PI (GLOVE) ×1 IMPLANT
GOWN STRL REIN XL XLG (GOWN DISPOSABLE) ×2 IMPLANT
GOWN STRL REUS W/ TWL LRG LVL3 (GOWN DISPOSABLE) ×1 IMPLANT
GOWN STRL REUS W/ TWL XL LVL3 (GOWN DISPOSABLE) ×1 IMPLANT
GOWN STRL REUS W/TWL LRG LVL3 (GOWN DISPOSABLE) ×1
GOWN STRL REUS W/TWL XL LVL3 (GOWN DISPOSABLE) ×1
IV NS IRRIG 3000ML ARTHROMATIC (IV SOLUTION) ×4 IMPLANT
KIT SHOULDER TRACTION (DRAPES) ×2 IMPLANT
MANIFOLD NEPTUNE II (INSTRUMENTS) ×2 IMPLANT
NDL SCORPION MULTI FIRE (NEEDLE) IMPLANT
NEEDLE SCORPION MULTI FIRE (NEEDLE) IMPLANT
NS IRRIG 1000ML POUR BTL (IV SOLUTION) ×1 IMPLANT
PACK DSU ARTHROSCOPY (CUSTOM PROCEDURE TRAY) ×2 IMPLANT
PORT APPOLLO RF 90DEGREE MULTI (SURGICAL WAND) ×1 IMPLANT
SET BASIN DAY SURGERY F.S. (CUSTOM PROCEDURE TRAY) ×2 IMPLANT
SHEET MEDIUM DRAPE 40X70 STRL (DRAPES) ×2 IMPLANT
SLEEVE SCD COMPRESS KNEE MED (MISCELLANEOUS) ×2 IMPLANT
SLING ARM FOAM STRAP LRG (SOFTGOODS) IMPLANT
SUT ETHILON 3 0 PS 1 (SUTURE) ×4 IMPLANT
SUT FIBERWIRE #2 38 T-5 BLUE (SUTURE)
SUT TIGER TAPE 7 IN WHITE (SUTURE) IMPLANT
SUTURE FIBERWR #2 38 T-5 BLUE (SUTURE) IMPLANT
SUTURE TAPE 1.3 40 TPR END (SUTURE) IMPLANT
SUTURE TAPE TIGERLINK 1.3MM BL (SUTURE) IMPLANT
SUTURETAPE 1.3 40 TPR END (SUTURE)
SUTURETAPE TIGERLINK 1.3MM BL (SUTURE)
SYR 50ML LL SCALE MARK (SYRINGE) ×1 IMPLANT
TAPE FIBER 2MM 7IN #2 BLUE (SUTURE) IMPLANT
TOWEL GREEN STERILE FF (TOWEL DISPOSABLE) ×2 IMPLANT
TUBE CONNECTING 20X1/4 (TUBING) ×1 IMPLANT
TUBING ARTHROSCOPY IRRIG 16FT (MISCELLANEOUS) ×1 IMPLANT
WATER STERILE IRR 1000ML POUR (IV SOLUTION) ×1 IMPLANT

## 2019-09-06 NOTE — Anesthesia Procedure Notes (Signed)
Procedure Name: Intubation Date/Time: 09/06/2019 8:27 AM Performed by: Raenette Rover, CRNA Pre-anesthesia Checklist: Patient identified, Emergency Drugs available, Suction available and Patient being monitored Patient Re-evaluated:Patient Re-evaluated prior to induction Oxygen Delivery Method: Circle system utilized Preoxygenation: Pre-oxygenation with 100% oxygen Induction Type: IV induction Ventilation: Mask ventilation without difficulty Laryngoscope Size: Miller and 3 Grade View: Grade I Tube type: Oral Tube size: 7.5 mm Number of attempts: 1 Airway Equipment and Method: Stylet Placement Confirmation: ETT inserted through vocal cords under direct vision,  positive ETCO2 and breath sounds checked- equal and bilateral Secured at: 22 cm Tube secured with: Tape Dental Injury: Teeth and Oropharynx as per pre-operative assessment

## 2019-09-06 NOTE — Anesthesia Postprocedure Evaluation (Signed)
Anesthesia Post Note  Patient: Reginald Rios  Procedure(s) Performed: RIGHT SHOULDER ARTHROSCOPY WITH EXTENSIVE DEBRIDEMENT, SUBACROMIAL DECOMPRESSION, BICEPS TENOTOMY (Right Shoulder) SHOULDER ARTHROSCOPY WITH BICEPSTENOTOMY (Right Shoulder)     Patient location during evaluation: PACU Anesthesia Type: General Level of consciousness: sedated Pain management: pain level controlled Vital Signs Assessment: post-procedure vital signs reviewed and stable Respiratory status: spontaneous breathing and respiratory function stable Cardiovascular status: stable Postop Assessment: no apparent nausea or vomiting Anesthetic complications: yes Anesthetic complication details: PONV   Last Vitals:  Vitals:   09/06/19 1115 09/06/19 1152  BP: 135/73 125/69  Pulse: 66 63  Resp: 13 14  Temp:  (!) 36.4 C  SpO2: 98% 100%    Last Pain:  Vitals:   09/06/19 1152  TempSrc:   PainSc: 3                  [place] DANIEL

## 2019-09-06 NOTE — Progress Notes (Signed)
Assisted Dr. Singer with right, ultrasound guided, interscalene  block. Side rails up, monitors on throughout procedure. See vital signs in flow sheet. Tolerated Procedure well. 

## 2019-09-06 NOTE — Discharge Instructions (Signed)
Post-operative patient instructions  Shoulder Arthroscopy   . Ice:  Place intermittent ice or cooler pack over your shoulder, 30 minutes on and 30 minutes off.  Continue this for the first 72 hours after surgery, then save ice for use after therapy sessions or on more active days.   . Weight:  You may bear weight on your arm as your symptoms allow. . Motion:  Perform gentle shoulder motion as tolerated . Dressing:  Perform 1st dressing change at 2 days postoperative. A moderate amount of blood tinged drainage is to be expected.  So if you bleed through the dressing on the first or second day or if you have fevers, it is fine to change the dressing/check the wounds early and redress wound.  If it bleeds through again, or if the incisions are leaking frank blood, please call the office. May change dressing every 1-2 days thereafter to help watch wounds. Can purchase Tegaderm (or 72M Nexcare) water resistant dressings at local pharmacy / [facility]. . Shower:  Light shower is ok after 2 days.  Please take shower, NO bath. Recover with gauze and ace wrap to help keep wounds protected.   . Pain medication:  A narcotic pain medication has been prescribed.  Take as directed.  Typically you need narcotic pain medication more regularly during the first 3 to 5 days after surgery.  Decrease your use of the medication as the pain improves.  Narcotics can sometimes cause constipation, even after a few doses.  If you have problems with constipation, you can take an over the counter stool softener or light laxative.  If you have persistent problems, please notify your physician's office. Marland Kitchen Physical therapy: Additional activity guidelines to be provided by your physician or physical therapist at follow-up visits.  . Driving: Do not recommend driving x 2 weeks post surgical, especially if surgery performed on right side. Should not drive while taking narcotic pain medications. It typically takes at least 2 weeks to  restore sufficient neuromuscular function for normal reaction times for driving safety.  . Call [phone] for questions or problems. Evenings you will be forwarded to the hospital operator.  Ask for the orthopaedic physician on call. Please call if you experience:    o Redness, foul smelling, or persistent drainage from the surgical site  o worsening shoulder pain and swelling not responsive to medication  o any calf pain and or swelling of the lower leg  o temperatures greater than 101.5 F o other questions or concerns   Thank you for allowing [place] to be a part of your care.   Post Anesthesia Home Care Instructions  Activity: Get plenty of rest for the remainder of the day. A responsible individual must stay with you for 24 hours following the procedure.  For the next 24 hours, DO NOT: -Drive a car -Paediatric nurse -Drink alcoholic beverages -Take any medication unless instructed by your physician -Make any legal decisions or sign important papers.  Meals: Start with liquid foods such as gelatin or soup. Progress to regular foods as tolerated. Avoid greasy, spicy, heavy foods. If nausea and/or vomiting occur, drink only clear liquids until the nausea and/or vomiting subsides. Call your physician if vomiting continues.  Special Instructions/Symptoms: Your throat may feel dry or sore from the anesthesia or the breathing tube placed in your throat during surgery. If this causes discomfort, gargle with warm salt water. The discomfort should disappear within 24 hours.  Call your surgeon if you experience:  1.  Fever over 101.0. 2.  Inability to urinate. 3.  Nausea and/or vomiting. 4.  Extreme swelling or bruising at the surgical site. 5.  Continued bleeding from the incision. 6.  Increased pain, redness or drainage from the incision. 7.  Problems related to your pain medication. 8.  Any problems and/or concerns  Regional Anesthesia Blocks  1. Numbness or the inability to  move the "blocked" extremity may last from 3-48 hours after placement. The length of time depends on the medication injected and your individual response to the medication. If the numbness is not going away after 48 hours, call your surgeon.  2. The extremity that is blocked will need to be protected until the numbness is gone and the  Strength has returned. Because you cannot feel it, you will need to take extra care to avoid injury. Because it may be weak, you may have difficulty moving it or using it. You may not know what position it is in without looking at it while the block is in effect.  3. For blocks in the legs and feet, returning to weight bearing and walking needs to be done carefully. You will need to wait until the numbness is entirely gone and the strength has returned. You should be able to move your leg and foot normally before you try and bear weight or walk. You will need someone to be with you when you first try to ensure you do not fall and possibly risk injury.  4. Bruising and tenderness at the needle site are common side effects and will resolve in a few days.  5. Persistent numbness or new problems with movement should be communicated to the surgeon or the [place] [phone] Tobias [phone]).  Information for Discharge Teaching: EXPAREL (bupivacaine liposome injectable suspension)   Your surgeon or anesthesiologist gave you EXPAREL(bupivacaine) to help control your pain after surgery.   EXPAREL is a local anesthetic that provides pain relief by numbing the tissue around the surgical site.  EXPAREL is designed to release pain medication over time and can control pain for up to 72 hours.  Depending on how you respond to EXPAREL, you may require less pain medication during your recovery.  Possible side effects:  Temporary loss of sensation or ability to move in the area where bupivacaine was injected.  Nausea, vomiting,  constipation  Rarely, numbness and tingling in your mouth or lips, lightheadedness, or anxiety may occur.  Call your doctor right away if you think you may be experiencing any of these sensations, or if you have other questions regarding possible side effects.  Follow all other discharge instructions given to you by your surgeon or nurse. Eat a healthy diet and drink plenty of water or other fluids.  If you return to the hospital for any reason within 96 hours following the administration of EXPAREL, it is important for health care providers to know that you have received this anesthetic. A teal colored band has been placed on your arm with the date, time and amount of EXPAREL you have received in order to alert and inform your health care providers. Please leave this armband in place for the full 96 hours following administration, and then you may remove the band.  **May take Tylenol at 1pm

## 2019-09-06 NOTE — H&P (Signed)
PREOPERATIVE H&P  Chief Complaint: Right shoulder rotator cuff tendinopathy, biceps tendinopathy  HPI: Reginald Rios is a 64 y.o. male who presents for surgical treatment of Right shoulder rotator cuff tendinopathy, biceps tendinopathy.  He denies any changes in medical history.  Past Medical History:  Diagnosis Date  . ADD (attention deficit disorder)   . Anemia   . Arthritis   . CAD (coronary artery disease)    2/19 PCI/DESx1 to Lcx  . Diabetes mellitus without complication ([place])   . Exercise-induced asthma   . Heart murmur   . Hyperlipidemia    family hx of high cholesterol  . Hypogonadism in male   . Low back pain   . Osteoarthritis    Past Surgical History:  Procedure Laterality Date  . CORONARY STENT INTERVENTION N/A 06/23/2017   Procedure: CORONARY STENT INTERVENTION;  Surgeon: Leonie Man, MD;  Location: [facility];  Service: Cardiovascular;  Laterality: N/A;  . INTRAVASCULAR PRESSURE WIRE/FFR STUDY N/A 06/23/2017   Procedure: INTRAVASCULAR PRESSURE WIRE/FFR STUDY;  Surgeon: Leonie Man, MD;  Location: [facility];  Service: Cardiovascular;  Laterality: N/A;  . KNEE ARTHROSCOPY    . LEFT HEART CATH AND CORONARY ANGIOGRAPHY N/A 06/23/2017   Procedure: LEFT HEART CATH AND CORONARY ANGIOGRAPHY;  Surgeon: Leonie Man, MD;  Location: [facility];  Service: Cardiovascular;  Laterality: N/A;  . TENOTOMY ACHILLES TENDON     Social History   Socioeconomic History  . Marital status: Married    Spouse name: Not on file  . Number of children: Not on file  . Years of education: Not on file  . Highest education level: Not on file  Occupational History  . Occupation: Air cabin crew   Tobacco Use  . Smoking status: Never Smoker  . Smokeless tobacco: Never Used  Substance and Sexual Activity  . Alcohol use: Yes    Alcohol/week: 0.0 standard drinks    Comment: rare  . Drug use: No  . Sexual activity: Not on file  Other Topics  Concern  . Not on file  Social History Narrative   Previously worked as Geophysicist/field seismologist   Currently working at [employer] clinic   Married 30 years   2 children ages 73, 47   Wife is psychiatrist - Dr. Margurite Auerbach   Moved from [place] up in [place] Strain:   . Difficulty of Paying Living Expenses:   Food Insecurity:   . Worried About Charity fundraiser in the Last Year:   . Arboriculturist in the Last Year:   Transportation Needs:   . Film/video editor (Medical):   Marland Kitchen Lack of Transportation (Non-Medical):   Physical Activity:   . Days of Exercise per Week:   . Minutes of Exercise per Session:   Stress:   . Feeling of Stress :   Social Connections:   . Frequency of Communication with Friends and Family:   . Frequency of Social Gatherings with Friends and Family:   . Attends Religious Services:   . Active Member of Clubs or Organizations:   . Attends Archivist Meetings:   Marland Kitchen Marital Status:    Family History  Problem Relation Age of Onset  . Lung cancer Mother 66  . Cancer Mother        lung cancer  . Coronary artery disease Father 30  . Heart disease Father   .  Hypothyroidism Brother   . Food Allergy Son   . Asthma Paternal Aunt   . Colon cancer Neg Hx   . Prostate cancer Neg Hx   . Allergic rhinitis Neg Hx   . Angioedema Neg Hx   . Eczema Neg Hx   . Immunodeficiency Neg Hx   . Urticaria Neg Hx    Allergies  Allergen Reactions  . Invokana [Canagliflozin] Rash  . Grass Pollen(K-O-R-T-Swt Vern)   . Lisinopril Cough   Prior to Admission medications   Medication Sig Start Date End Date Taking? Authorizing Provider  albuterol (VENTOLIN HFA) 108 (90 Base) MCG/ACT inhaler 2 puffs every 4-6 hours as needed for coughing or wheezing spells 04/13/19  Yes Bobbitt, Sedalia Muta, MD  aspirin EC 81 MG tablet Take 81 mg by mouth daily.   Yes [provider]  clopidogrel (PLAVIX) 75 MG tablet  TAKE 1 TABLET (75 MG TOTAL) BY MOUTH DAILY WITH BREAKFAST. 02/20/19  Yes Richardo Priest, MD  Coenzyme Q10 200 MG capsule Take 200 mg by mouth daily.   Yes [provider]  dapagliflozin propanediol (FARXIGA) 10 MG TABS tablet Take 10 mg by mouth daily. 01/10/19  Yes [name], DO  ezetimibe (ZETIA) 10 MG tablet TAKE 1 TABLET (10 MG TOTAL) BY MOUTH DAILY. 09/01/19 11/30/19 Yes Richardo Priest, MD  fluticasone (FLOVENT HFA) 110 MCG/ACT inhaler Inhale 2 puffs into the lungs 2 (two) times daily. Use with spacer. Rinse, gargle and spit out after use 04/13/19  Yes Bobbitt, Sedalia Muta, MD  JANUVIA 100 MG tablet TAKE 1 TABLET (100 MG TOTAL) BY MOUTH DAILY. 06/15/19  Yes [name], DO  lidocaine (LIDODERM) 5 % Place 1 patch onto the skin daily. Remove & Discard patch within 12 hours or as directed by MD 05/19/19  Yes Kirsteins, Luanna Salk, MD  losartan (COZAAR) 25 MG tablet TAKE 1 TABLET (25 MG TOTAL) BY MOUTH DAILY. 08/01/19  Yes Richardo Priest, MD  metFORMIN (GLUCOPHAGE) 1000 MG tablet Take 1 tablet (1,000 mg total) by mouth 2 (two) times daily with a meal. 01/10/19  Yes Wendling, Crosby Oyster, DO  methylphenidate (RITALIN) 20 MG tablet Take 1 tablet (20 mg total) by mouth 2 (two) times daily with breakfast and lunch. 07/11/19  Yes [name], DO  montelukast (SINGULAIR) 10 MG tablet Take 1 tablet (10 mg total) by mouth daily. 06/26/19  Yes [name], DO  pioglitazone (ACTOS) 30 MG tablet Take 1 tablet (30 mg total) by mouth daily. 07/11/19  Yes [name], DO  pravastatin (PRAVACHOL) 20 MG tablet TAKE 1 TABLET (20 MG TOTAL) BY MOUTH DAILY. 07/10/19  Yes Wendling, Crosby Oyster, DO  pregabalin (LYRICA) 75 MG capsule TAKE 1 CAPSULE (75 MG TOTAL) BY MOUTH 2 (TWO) TIMES DAILY. 07/24/19  Yes [name], DO  Blood Glucose Monitoring Suppl (FREESTYLE FREEDOM LITE) w/Device KIT Use to check blood sugar daily. 05/26/19   [name], DO  glucose blood (FREESTYLE LITE) test strip Use daily to check blood sugar. 05/26/19   [name], DO  Lancets (FREESTYLE) lancets Use daily to check blood sugar 05/26/19   Wendling, Crosby Oyster, DO  nitroGLYCERIN (NITROSTAT) 0.4 MG SL tablet Place 1 tablet (0.4 mg total) under the tongue every 5 (five) minutes as needed. 06/23/17   Cheryln Manly, NP  ondansetron (ZOFRAN) 4 MG tablet Take 1-2 tablets (4-8 mg total) by mouth every 8 (eight) hours as needed for nausea or vomiting.  09/06/19   Leandrew Koyanagi, MD  oxyCODONE-acetaminophen (PERCOCET) 5-325 MG tablet Take 1-2 tablets by mouth every 8 (eight) hours as needed for severe pain. 09/06/19   Leandrew Koyanagi, MD  tadalafil (CIALIS) 20 MG tablet Take 0.5-1 tablets (10-20 mg total) by mouth every other day as needed for erectile dysfunction. 07/11/19   [name], DO  testosterone (ANDROGEL) 50 MG/5GM (1%) GEL APPLY 2 APPLICATIONS (10 GRAMS) ONTO THE SKIN DAILY 03/15/19   Nani Ravens, Crosby Oyster, DO  traMADol (ULTRAM) 50 MG tablet Take 1 tablet (50 mg total) by mouth every 12 (twelve) hours as needed. 06/20/19   Kirsteins, Luanna Salk, MD     Positive ROS: All other systems have been reviewed and were otherwise negative with the exception of those mentioned in the HPI and as above.  Physical Exam: General: Alert, no acute distress Cardiovascular: No pedal edema Respiratory: No cyanosis, no use of accessory musculature GI: abdomen soft Skin: No lesions in the area of chief complaint Neurologic: Sensation intact distally Psychiatric: Patient is competent for consent with normal mood and affect Lymphatic: no lymphedema  MUSCULOSKELETAL: exam stable  Assessment: Right shoulder rotator cuff tendinopathy, biceps tendinopathy  Plan: Plan for Procedure(s): RIGHT SHOULDER ARTHROSCOPY WITH EXTENSIVE DEBRIDEMENT, SUBACROMIAL DECOMPRESSION, BICEPS TENOTOMY AND POSSIBLE ROTATOR CUFF REPAIR  The risks benefits and  alternatives were discussed with the patient including but not limited to the risks of nonoperative treatment, versus surgical intervention including infection, bleeding, nerve injury,  blood clots, cardiopulmonary complications, morbidity, mortality, among others, and they were willing to proceed.   Eduard Roux, MD   09/06/2019 8:23 AM

## 2019-09-06 NOTE — Anesthesia Procedure Notes (Signed)
Anesthesia Regional Block: Interscalene brachial plexus block   Pre-Anesthetic Checklist: ,, timeout performed, Correct Patient, Correct Site, Correct Laterality, Correct Procedure, Correct Position, site marked, Risks and benefits discussed,  Surgical consent,  Pre-op evaluation,  At surgeon's request and post-op pain management  Laterality: Right  Prep: chloraprep       Needles:  Injection technique: Single-shot  Needle Type: Echogenic Stimulator Needle     Needle Length: 5cm  Needle Gauge: 22     Additional Needles:   Narrative:  Start time: 09/06/2019 7:56 AM End time: 09/06/2019 8:06 AM Injection made incrementally with aspirations every 5 mL.  Performed by: Personally  Anesthesiologist: Duane Boston, MD  Additional Notes: Functioning IV was confirmed and monitors applied.  A 48mm 22ga echogenic arrow stimulator was used. Sterile prep and drape,hand hygiene and sterile gloves were used.Ultrasound guidance: relevant anatomy identified, needle position confirmed, local anesthetic spread visualized around nerve(s)., vascular puncture avoided.  Image printed for medical record.  Negative aspiration and negative test dose prior to incremental administration of local anesthetic. The patient tolerated the procedure well.

## 2019-09-06 NOTE — Op Note (Signed)
   Date of Surgery: 09/06/2019  INDICATIONS: The patient is with right shoulder pain that has failed conservative treatment;  The patient did consent to the procedure after discussion of the risks and benefits.  PREOPERATIVE DIAGNOSIS:  1.  Right proximal biceps tendinopathy 2.  Right degenerative superior labrum tear 3.  Right supraspinatus tendinopathy with partial articular surface tear 4.  Right subacromial and subdeltoid bursitis 5.  Right shoulder impingement syndrome  POSTOPERATIVE DIAGNOSIS: Same.  PROCEDURE:  1.  Right shoulder arthroscopic extensive debridement of biceps tendon, labrum, rotator interval, superior rotator cuff 2.  Right shoulder arthroscopic subacromial decompression with acromioplasty and CA ligament release  SURGEON: N. Eduard Roux, M.D.  ASSIST: Ciro Backer [place], [place]; necessary for the timely completion of procedure and due to complexity of procedure..  ANESTHESIA:  general, regional  IV FLUIDS AND URINE: See anesthesia.  ESTIMATED BLOOD LOSS: minimal mL.  IMPLANTS: None  COMPLICATIONS: None.  DESCRIPTION OF PROCEDURE: The patient was brought to the operating room and placed supine on the operating table.  The patient had been signed prior to the procedure and this was documented. The patient had the anesthesia placed by the anesthesiologist.  A time-out was performed to confirm that this was the correct patient, site, side and location. The patient did receive antibiotics prior to the incision and was re-dosed during the procedure as needed at indicated intervals.  The patient was then positioned into the beach chair position with all bony prominences well padded and neutral C spine. The patient had the operative extremity prepped and draped in the standard surgical fashion.    Incisions were made for her shoulder arthroscopy portals.  Diagnostic shoulder arthroscopy was first performed.  We encountered moderately degenerative labral tearing mainly in  the superior labral complex.  He had mild synovitis in the rotator interval.  The subscapularis tendon was grossly.  He did have severe tendinopathy of the biceps tendon just distal to the bicipital anchor.  He also had partial articular surface tearing of the superior rotator cuff.  Also a shaver was used to treat all these conditions.  An ArthroCare wand was used to perform the biceps tenotomy and the stump was then debrided with an oscillating shaver back to the smooth surface.  There were no full-thickness tears in his rotator cuff.  The synovitis was debrided.  We then reposition the arthroscope into the subacromial space.  He had a very thickened and inflamed subacromial bursa.  This was adherent to the bursal surface of the superior rotator cuff.  This was fully excised using oscillating shaver.  Subacromial decompression with acromioplasty and CA ligament release was performed.  The bursal surface of the rotator cuff was then gently debrided.  No tears could be identified.  Excess fluid was then removed from the shoulder joint.  Incisions were closed with interrupted nylon sutures.  Sterile dressings were applied.  Regular shoulder sling was placed.  Patient tolerated the procedure well had no immediate complications.  POSTOPERATIVE PLAN: Discharge home follow-up in 1 week for suture removal.  Azucena Cecil, MD [facility] [phone] 9:35 AM

## 2019-09-06 NOTE — Transfer of Care (Addendum)
Immediate Anesthesia Transfer of Care Note  Patient: Reginald Rios  Procedure(s) Performed: RIGHT SHOULDER ARTHROSCOPY WITH EXTENSIVE DEBRIDEMENT, SUBACROMIAL DECOMPRESSION, BICEPS TENOTOMY (Right Shoulder) SHOULDER ARTHROSCOPY WITH BICEPSTENOTOMY (Right Shoulder)  Patient Location: PACU  Anesthesia Type:GA combined with regional for post-op pain  Level of Consciousness: drowsy and patient cooperative  Airway & Oxygen Therapy: Patient Spontanous Breathing and Patient connected to nasal cannula oxygen  Post-op Assessment: Report given to RN and Post -op Vital signs reviewed and stable  Post vital signs: Reviewed and stable  Last Vitals:  Vitals Value Taken Time  BP 139/73 09/06/19 0949  Temp    Pulse 74 09/06/19 0951  Resp 0--pt is breathing 09/06/19 0951  SpO2 100 % 09/06/19 0951  Vitals shown include unvalidated device data.  Last Pain:  Vitals:   09/06/19 0657  TempSrc: Tympanic  PainSc: 6       Patients Stated Pain Goal: 6 ([phone] [phone])  Complications: No apparent anesthesia complications

## 2019-09-08 ENCOUNTER — Encounter: Payer: Self-pay | Admitting: *Deleted

## 2019-09-11 ENCOUNTER — Telehealth: Payer: Self-pay

## 2019-09-11 NOTE — Telephone Encounter (Signed)
Yes it's normal to have bruising after cutting the biceps.

## 2019-09-11 NOTE — Telephone Encounter (Signed)
Called patient to advise  °

## 2019-09-11 NOTE — Telephone Encounter (Signed)
Patient called stating that he has a red blotch on his bicep, that looks like a bruise, but may be healing.  Would like to know if this is normal?  Patient had right shoulder surgery on 5/5/20201.  Patient has an appointment on Wednesday, 09/13/2019.  CB# [phone].  Please advise.  Thank you.

## 2019-09-13 ENCOUNTER — Ambulatory Visit (INDEPENDENT_AMBULATORY_CARE_PROVIDER_SITE_OTHER): Payer: 59 | Admitting: Orthopaedic Surgery

## 2019-09-13 ENCOUNTER — Encounter: Payer: Self-pay | Admitting: Orthopaedic Surgery

## 2019-09-13 ENCOUNTER — Other Ambulatory Visit: Payer: Self-pay

## 2019-09-13 DIAGNOSIS — M75111 Incomplete rotator cuff tear or rupture of right shoulder, not specified as traumatic: Secondary | ICD-10-CM

## 2019-09-13 DIAGNOSIS — M67921 Unspecified disorder of synovium and tendon, right upper arm: Secondary | ICD-10-CM

## 2019-09-13 NOTE — Progress Notes (Signed)
Post-Op Visit Note   Patient: Reginald Rios           Date of Birth: 02/19/1956           MRN: [id number] Visit Date: 09/13/2019 PCP: [name], DO   Assessment & Plan:  Chief Complaint:  Chief Complaint  Patient presents with  . Right Shoulder - Routine Post Op   Visit Diagnoses:  1. Nontraumatic incomplete tear of right rotator cuff   2. Tendinopathy of right biceps tendon     Plan: Patient is 1 week status post right shoulder scope and debridement.  He is doing well overall.  Not taking any pain medicines.  Surgical incisions are healed.  Sutures removed today.  Range of motion is moderately limited as expected for 1 week postop.  Neurovascular intact distally.  At this point we will begin outpatient physical therapy at [facility].  He may wean shoulder sling as tolerated.  Recheck in 4 weeks.  Follow-Up Instructions: Return in about 4 weeks (around 10/11/2019).   Orders:  Orders Placed This Encounter  Procedures  . Ambulatory referral to Physical Therapy   No orders of the defined types were placed in this encounter.   Imaging: No results found.  PMFS History: Patient Active Problem List   Diagnosis Date Noted  . Myofascial pain syndrome, cervical 07/28/2019  . Lateral epicondylitis of right elbow 07/28/2019  . Nontraumatic incomplete tear of right rotator cuff 07/25/2019  . Tendinopathy of right biceps tendon 07/25/2019  . Erectile dysfunction 07/11/2019  . Mild persistent asthma/cough variant asthma 04/13/2019  . Allergic rhinitis with a possible nonallergic component 04/13/2019  . Acid reflux 04/13/2019  . Cough 04/13/2019  . Hoarseness 04/13/2019  . Cervical radicular pain 01/10/2019  . Ulnar neuropathy at elbow of right upper extremity 01/03/2019  . Acute bilateral low back pain without sciatica 01/03/2019  . Onychomycosis 05/25/2018  . Palpitations 07/06/2017  . CAD (coronary artery disease) 07/05/2017  . Family history of  early CAD 07/05/2017  . Abnormal findings on diagnostic imaging of cardiovascular system 06/23/2017  . Low vitamin D level 04/22/2017  . Pain in right ankle and joints of right foot 10/05/2016  . Hemarthrosis of right elbow 02/03/2016  . Small thenar eminence 02/03/2016  . Cervical disc disorder with radiculopathy of cervical region 10/07/2015  . Incomplete rotator cuff tear 09/10/2015  . Nonischemic cardiomyopathy ([place]) 05/15/2015  . Fatigue 05/15/2015  . Dyspnea on exertion 05/15/2015  . Decreased cardiac ejection fraction 04/02/2015  . Strain of latissimus dorsi muscle 01/21/2015  . Shoulder bursitis 10/12/2014  . Stenosis of lateral recess of lumbar spine 10/12/2014  . BPPV (benign paroxysmal positional vertigo) 08/14/2014  . Hyperlipidemia 01/19/2014  . Anemia, unspecified 01/19/2014  . ADD (attention deficit disorder) 01/19/2014  . Type 2 diabetes mellitus with hyperglycemia, without long-term current use of insulin ([place]) 12/22/2013  . Hypogonadism in male 12/22/2013  . Arthritis of hand, left 12/22/2013   Past Medical History:  Diagnosis Date  . ADD (attention deficit disorder)   . Anemia   . Arthritis   . CAD (coronary artery disease)    2/19 PCI/DESx1 to Lcx  . Diabetes mellitus without complication ([place])   . Exercise-induced asthma   . Heart murmur   . Hyperlipidemia    family hx of high cholesterol  . Hypogonadism in male   . Low back pain   . Osteoarthritis     Family History  Problem Relation Age of Onset  .  Lung cancer Mother 22  . Cancer Mother        lung cancer  . Coronary artery disease Father 75  . Heart disease Father   . Hypothyroidism Brother   . Food Allergy Son   . Asthma Paternal Aunt   . Colon cancer Neg Hx   . Prostate cancer Neg Hx   . Allergic rhinitis Neg Hx   . Angioedema Neg Hx   . Eczema Neg Hx   . Immunodeficiency Neg Hx   . Urticaria Neg Hx     Past Surgical History:  Procedure Laterality Date  . CORONARY STENT INTERVENTION  N/A 06/23/2017   Procedure: CORONARY STENT INTERVENTION;  Surgeon: Leonie Man, MD;  Location: [place] [facility];  Service: Cardiovascular;  Laterality: N/A;  . INTRAVASCULAR PRESSURE WIRE/FFR STUDY N/A 06/23/2017   Procedure: INTRAVASCULAR PRESSURE WIRE/FFR STUDY;  Surgeon: Leonie Man, MD;  Location: [place] [facility];  Service: Cardiovascular;  Laterality: N/A;  . KNEE ARTHROSCOPY    . LEFT HEART CATH AND CORONARY ANGIOGRAPHY N/A 06/23/2017   Procedure: LEFT HEART CATH AND CORONARY ANGIOGRAPHY;  Surgeon: Leonie Man, MD;  Location: [place] [facility];  Service: Cardiovascular;  Laterality: N/A;  . SHOULDER ARTHROSCOPY WITH BICEPSTENOTOMY Right 09/06/2019   Procedure: SHOULDER ARTHROSCOPY WITH BICEPSTENOTOMY;  Surgeon: Leandrew Koyanagi, MD;  Location: [place];  Service: Orthopedics;  Laterality: Right;  . SHOULDER ARTHROSCOPY WITH SUBACROMIAL DECOMPRESSION Right 09/06/2019   Procedure: RIGHT SHOULDER ARTHROSCOPY WITH EXTENSIVE DEBRIDEMENT, SUBACROMIAL DECOMPRESSION, BICEPS TENOTOMY;  Surgeon: Leandrew Koyanagi, MD;  Location: [place];  Service: Orthopedics;  Laterality: Right;  . TENOTOMY ACHILLES TENDON     Social History   Occupational History  . Occupation: Air cabin crew   Tobacco Use  . Smoking status: Never Smoker  . Smokeless tobacco: Never Used  Substance and Sexual Activity  . Alcohol use: Yes    Alcohol/week: 0.0 standard drinks    Comment: rare  . Drug use: No  . Sexual activity: Not on file

## 2019-09-15 ENCOUNTER — Other Ambulatory Visit: Payer: Self-pay | Admitting: Family Medicine

## 2019-09-15 ENCOUNTER — Telehealth: Payer: Self-pay

## 2019-09-15 DIAGNOSIS — F988 Other specified behavioral and emotional disorders with onset usually occurring in childhood and adolescence: Secondary | ICD-10-CM

## 2019-09-15 NOTE — Telephone Encounter (Signed)
No protocol.  Just strengthening and ROM as tolerated

## 2019-09-15 NOTE — Telephone Encounter (Signed)
Faxed them this. Per Dr Erlinda Hong

## 2019-09-15 NOTE — Telephone Encounter (Signed)
Requesting: ritilan Contract:01/20/2019 UDS:01/10/2019 Last Visit:07/11/19 Next Visit:10/13/19 Last Refill:  Please Advise

## 2019-09-15 NOTE — Telephone Encounter (Signed)
Reginald Rios with Outpatient Rehab.would like protocol for Bicepstenotomy to be faxed to [phone].  CB# [phone].  Please advise.  Thank you.

## 2019-09-16 MED ORDER — METHYLPHENIDATE HCL 20 MG PO TABS: 20.0000 mg | ORAL_TABLET | Freq: Two times a day (BID) | ORAL | 0 refills | Status: DC

## 2019-09-16 MED ORDER — METHYLPHENIDATE HCL 20 MG PO TABS
20.0000 mg | ORAL_TABLET | Freq: Two times a day (BID) | ORAL | 0 refills | Status: DC
Start: 2019-10-16 — End: 2020-01-15

## 2019-09-18 ENCOUNTER — Encounter: Payer: Self-pay | Admitting: Physical Therapy

## 2019-09-18 ENCOUNTER — Other Ambulatory Visit: Payer: Self-pay

## 2019-09-18 ENCOUNTER — Ambulatory Visit: Payer: 59 | Attending: Orthopaedic Surgery | Admitting: Physical Therapy

## 2019-09-18 DIAGNOSIS — M25621 Stiffness of right elbow, not elsewhere classified: Secondary | ICD-10-CM | POA: Insufficient documentation

## 2019-09-18 DIAGNOSIS — R293 Abnormal posture: Secondary | ICD-10-CM | POA: Diagnosis not present

## 2019-09-18 DIAGNOSIS — M25611 Stiffness of right shoulder, not elsewhere classified: Secondary | ICD-10-CM | POA: Insufficient documentation

## 2019-09-18 DIAGNOSIS — M25511 Pain in right shoulder: Secondary | ICD-10-CM | POA: Insufficient documentation

## 2019-09-18 MED FILL — METHYLPHENIDATE 20 MG TAB: 20 | 30 days supply | Qty: 60 | Fill #0

## 2019-09-18 NOTE — Therapy (Signed)
[facility] [address]  [place], [place], [address] Phone: [phone]   Fax:  [phone]  Physical Therapy Evaluation  Patient Details  Name: Reginald Rios MRN: [id number] Date of Birth: 12/24/1955 Referring Provider (PT): Frankey Shown, MD   Encounter Date: 09/18/2019  PT End of Session - 09/18/19 1550    Visit Number  1    Number of Visits  13    Date for PT Re-Evaluation  10/30/19    Authorization Type  [facility]    PT Start Time  [id number]    PT Stop Time  1540    PT Time Calculation (min)  47 min    Activity Tolerance  Patient tolerated treatment well;Patient limited by pain    Behavior During Therapy  [facility] for tasks assessed/performed       Past Medical History:  Diagnosis Date  . ADD (attention deficit disorder)   . Anemia   . Arthritis   . CAD (coronary artery disease)    2/19 PCI/DESx1 to Lcx  . Diabetes mellitus without complication ([place])   . Exercise-induced asthma   . Heart murmur   . Hyperlipidemia    family hx of high cholesterol  . Hypogonadism in male   . Low back pain   . Osteoarthritis     Past Surgical History:  Procedure Laterality Date  . CORONARY STENT INTERVENTION N/A 06/23/2017   Procedure: CORONARY STENT INTERVENTION;  Surgeon: Leonie Man, MD;  Location: [facility];  Service: Cardiovascular;  Laterality: N/A;  . INTRAVASCULAR PRESSURE WIRE/FFR STUDY N/A 06/23/2017   Procedure: INTRAVASCULAR PRESSURE WIRE/FFR STUDY;  Surgeon: Leonie Man, MD;  Location: [facility];  Service: Cardiovascular;  Laterality: N/A;  . KNEE ARTHROSCOPY    . LEFT HEART CATH AND CORONARY ANGIOGRAPHY N/A 06/23/2017   Procedure: LEFT HEART CATH AND CORONARY ANGIOGRAPHY;  Surgeon: Leonie Man, MD;  Location: [facility];  Service: Cardiovascular;  Laterality: N/A;  . SHOULDER ARTHROSCOPY WITH BICEPSTENOTOMY Right 09/06/2019   Procedure: SHOULDER ARTHROSCOPY WITH BICEPSTENOTOMY;   Surgeon: Leandrew Koyanagi, MD;  Location: [facility];  Service: Orthopedics;  Laterality: Right;  . SHOULDER ARTHROSCOPY WITH SUBACROMIAL DECOMPRESSION Right 09/06/2019   Procedure: RIGHT SHOULDER ARTHROSCOPY WITH EXTENSIVE DEBRIDEMENT, SUBACROMIAL DECOMPRESSION, BICEPS TENOTOMY;  Surgeon: Leandrew Koyanagi, MD;  Location: [facility];  Service: Orthopedics;  Laterality: Right;  . TENOTOMY ACHILLES TENDON      There were no vitals filed for this visit.   Subjective Assessment - 09/18/19 1455    Subjective  Patient reports undergoing R shoulder arthroscopy with biceps tenotomy on 09/06/19. Was in a sling for about a week, now not wearing it at all. Pain levels have ranged from 2-7/10 which is well-managed by Tylenol. Not using ice. Pain is located over the R posterolateral shoulder and worse with overhead lifting or pushing out into ER. Better with meds.    Pertinent History  OA, LBP, HLD, heart murmur, asthma, DM, CAD, anemia, ADD, achilles tenotomy, L heart cath & angio 06/2017, coronary stent 06/2017, C4-6 fusion    Limitations  Lifting;Writing;House hold activities    Diagnostic tests  none recent    Patient Stated Goals  "Being able to lift something, comb my hair"    Currently in Pain?  Yes    Pain Score  5     Pain Location  Shoulder    Pain Orientation  Right;Lateral  Pain Descriptors / Indicators  Sharp    Pain Type  Acute pain;Surgical pain         [facility] PT Assessment - 09/18/19 1500      Assessment   Medical Diagnosis  Nontraumatic tear of R RTC, tendinopathy of R biceps tendon    Referring Provider (PT)  Frankey Shown, MD    Onset Date/Surgical Date  09/06/19    Hand Dominance  Right    Next MD Visit  10/11/19    Prior Therapy  yes- R shoulder      Precautions   Precautions  None      Balance Screen   Has the patient fallen in the past 6 months  No    Has the patient had a decrease in activity level because of a fear of falling?   No    Is the  patient reluctant to leave their home because of a fear of falling?   No      Home Environment   Living Environment  Private residence    Living Arrangements  Spouse/significant other    Available Help at Discharge  Family    Type of [place] to enter      Prior Function   Level of Independence  Independent    Vocation  Retired    Leisure  pickleball, tennis      Cognition   Overall Cognitive Status  Within Functional Limits for tasks assessed      Sensation   Light Touch  Appears Intact   c/o chronic N/T in R 5th digit     Coordination   Gross Motor Movements are Fluid and Coordinated  Yes      Posture/Postural Control   Posture/Postural Control  Postural limitations    Postural Limitations  Rounded Shoulders;Forward head    Posture Comments  heavily rounded shoulders      AROM   AROM Assessment Site  Elbow;Forearm    Left Shoulder Flexion  131 Degrees    Left Shoulder ABduction  129 Degrees    Left Shoulder Internal Rotation  --   FIR T5   Left Shoulder External Rotation  --   FER T1   Right/Left Elbow  Right;Left    Right Elbow Flexion  129   shoulder pain   Right Elbow Extension  22   pain   Left Elbow Flexion  140    Left Elbow Extension  2    Right/Left Forearm  Right;Left    Right Forearm Pronation  80 Degrees    Right Forearm Supination  60 Degrees    Left Forearm Pronation  79 Degrees    Left Forearm Supination  80 Degrees      PROM   PROM Assessment Site  Elbow;Shoulder    Right/Left Shoulder  Right    Right Shoulder Flexion  125 Degrees    Right Shoulder ABduction  127 Degrees   scaption   Right Shoulder Internal Rotation  77 Degrees    Right Shoulder External Rotation  47 Degrees    Right/Left Elbow  --      Strength   Left Shoulder Flexion  4+/5    Left Shoulder ABduction  4+/5    Left Shoulder Internal Rotation  4/5    Left Shoulder External Rotation  4+/5    Left Elbow Flexion  4+/5    Left Elbow Extension  4/5     Right Wrist Flexion  4+/5    Right Wrist Extension  4+/5    Left Wrist Flexion  5/5    Left Wrist Extension  5/5    Right Hand Grip (lbs)  33.33   30, 35, 35   Left Hand Grip (lbs)  39.33   42, 40, 36     Palpation   Palpation comment  TTP over R lateral triceps and proximal biceps tendon; incisions well-healing and covered by steri strips                  Objective measurements completed on examination: See above findings.              PT Education - 09/18/19 1549    Education Details  prognosis, POC, HEP; edu on performing exercises and ADLs to patient's tolerance but to avoid racket sports for now    Person(s) Educated  Patient    Methods  Explanation;Demonstration;Tactile cues;Verbal cues;Handout    Comprehension  Verbalized understanding;Returned demonstration       PT Short Term Goals - 09/18/19 1552      PT SHORT TERM GOAL #1   Title  Patient to be independent with initial HEP.    Time  3    Period  Weeks    Status  New    Target Date  10/09/19        PT Long Term Goals - 09/18/19 1553      PT LONG TERM GOAL #1   Title  Patient to be independent with advanced HEP.    Time  6    Period  Weeks    Status  New    Target Date  10/30/19      PT LONG TERM GOAL #2   Title  Patient to demonstrate [facility] and pain-free R shoulder AROM/PROM.    Time  6    Period  Weeks    Status  New    Target Date  10/30/19      PT LONG TERM GOAL #3   Title  Patient to demonstrate >=4+/5 strength in R UE and demonstrate symmetrical grip strength in B hands.    Time  6    Period  Weeks    Status  New    Target Date  10/30/19      PT LONG TERM GOAL #4   Title  Patient to report 70% improvement in pain levels with overhead reaching.    Time  6    Period  Weeks    Status  New    Target Date  10/30/19      PT LONG TERM GOAL #5   Title  Patient to return to pain-free pickleball with modifications as needed.    Time  6    Period  Weeks    Status  New     Target Date  10/30/19             Plan - 09/18/19 1550    Clinical Impression Statement  Patient is a 64y/o M presenting to OPPT with c/o R shoulder pain s/p R shoulder arthroscopy with extensive debridement, subacromial decompression with acromioplasty, CA ligament release, and biceps tenotomy on 09/06/19. Patient now out of sling and tolerating ADLs with mild-moderate pain. Pain is located over R posterolateral shoulder and worse with overhead lifting or moving into ER. Patient would like to be able to work towards returning to tennis and pickleball. Patient today presenting with heavily rounded shoulders and forward head posture, limited R  shoulder and elbow ROM, decreased R grip strength, and TTP over R lateral triceps and proximal biceps tendon. Patient educated on gentle ROM and postural correction HEP- patient reported understanding. Would benefit from skilled PT services 2x/week for 6 weeks to address aforementioned impairments.    Personal Factors and Comorbidities  Age;Comorbidity 3+;Fitness;Past/Current Experience;Profession;Time since onset of injury/illness/exacerbation    Comorbidities  OA, LBP, HLD, heart murmur, asthma, DM, CAD, anemia, ADD, achilles tenotomy, L heart cath & angio 06/2017, coronary stent 06/2017, C4-6 fusion    Examination-Activity Limitations  Bathing;Sleep;Caring for Others;Carry;Toileting;Dressing;Hygiene/Grooming;Lift;Reach Overhead;Self Feeding    Examination-Participation Restrictions  Cleaning;Shop;Community Activity;Driving;Yard Work;Laundry;Meal Prep    Stability/Clinical Decision Making  Stable/Uncomplicated    Clinical Decision Making  Low    Rehab Potential  Good    PT Frequency  2x / week    PT Duration  6 weeks    PT Treatment/Interventions  ADLs/Self Care Home Management;Cryotherapy;Electrical Stimulation;Moist Heat;Therapeutic exercise;Therapeutic activities;Functional mobility training;Ultrasound;Neuromuscular re-education;Patient/family  education;Manual techniques;Vasopneumatic Device;Taping;Energy conservation;Dry needling;Passive range of motion    PT Next Visit Plan  shoulder FOTO; reassess HEP; work on R elbow and shoulder PROM/AAROM to tolerance    Consulted and Agree with Plan of Care  Patient       Patient will benefit from skilled therapeutic intervention in order to improve the following deficits and impairments:  Hypomobility, Impaired sensation, Decreased activity tolerance, Decreased strength, Increased fascial restricitons, Impaired UE functional use, Pain, Improper body mechanics, Decreased range of motion, Postural dysfunction, Impaired flexibility, Decreased scar mobility  Visit Diagnosis: Acute pain of right shoulder  Stiffness of right shoulder, not elsewhere classified  Stiffness of right elbow, not elsewhere classified  Abnormal posture     Problem List Patient Active Problem List   Diagnosis Date Noted  . Myofascial pain syndrome, cervical 07/28/2019  . Lateral epicondylitis of right elbow 07/28/2019  . Nontraumatic incomplete tear of right rotator cuff 07/25/2019  . Tendinopathy of right biceps tendon 07/25/2019  . Erectile dysfunction 07/11/2019  . Mild persistent asthma/cough variant asthma 04/13/2019  . Allergic rhinitis with a possible nonallergic component 04/13/2019  . Acid reflux 04/13/2019  . Cough 04/13/2019  . Hoarseness 04/13/2019  . Cervical radicular pain 01/10/2019  . Ulnar neuropathy at elbow of right upper extremity 01/03/2019  . Acute bilateral low back pain without sciatica 01/03/2019  . Onychomycosis 05/25/2018  . Palpitations 07/06/2017  . CAD (coronary artery disease) 07/05/2017  . Family history of early CAD 07/05/2017  . Abnormal findings on diagnostic imaging of cardiovascular system 06/23/2017  . Low vitamin D level 04/22/2017  . Pain in right ankle and joints of right foot 10/05/2016  . Hemarthrosis of right elbow 02/03/2016  . Small thenar eminence  02/03/2016  . Cervical disc disorder with radiculopathy of cervical region 10/07/2015  . Incomplete rotator cuff tear 09/10/2015  . Nonischemic cardiomyopathy ([place]) 05/15/2015  . Fatigue 05/15/2015  . Dyspnea on exertion 05/15/2015  . Decreased cardiac ejection fraction 04/02/2015  . Strain of latissimus dorsi muscle 01/21/2015  . Shoulder bursitis 10/12/2014  . Stenosis of lateral recess of lumbar spine 10/12/2014  . BPPV (benign paroxysmal positional vertigo) 08/14/2014  . Hyperlipidemia 01/19/2014  . Anemia, unspecified 01/19/2014  . ADD (attention deficit disorder) 01/19/2014  . Type 2 diabetes mellitus with hyperglycemia, without long-term current use of insulin ([name]) 12/22/2013  . Hypogonadism in male 12/22/2013  . Arthritis of hand, left 12/22/2013     Janene Harvey, PT, DPT 09/18/19 3:57 PM   [facility] [address]  [place], [place], [address] Phone: [phone]   Fax:  [phone]  Name: Reginald Rios MRN: [id number] Date of Birth: 12/23/1955

## 2019-09-20 ENCOUNTER — Ambulatory Visit: Payer: 59

## 2019-09-26 ENCOUNTER — Encounter: Payer: Self-pay | Admitting: Physical Therapy

## 2019-09-26 ENCOUNTER — Ambulatory Visit: Payer: 59 | Attending: Family Medicine | Admitting: Physical Therapy

## 2019-09-26 ENCOUNTER — Other Ambulatory Visit: Payer: Self-pay

## 2019-09-26 DIAGNOSIS — M25621 Stiffness of right elbow, not elsewhere classified: Secondary | ICD-10-CM | POA: Diagnosis not present

## 2019-09-26 DIAGNOSIS — M25611 Stiffness of right shoulder, not elsewhere classified: Secondary | ICD-10-CM | POA: Diagnosis not present

## 2019-09-26 DIAGNOSIS — R293 Abnormal posture: Secondary | ICD-10-CM

## 2019-09-26 DIAGNOSIS — M25511 Pain in right shoulder: Secondary | ICD-10-CM | POA: Diagnosis not present

## 2019-09-26 NOTE — Therapy (Signed)
[facility] [address]  [place], [place], [address] Phone: [phone]   Fax:  [phone]  Physical Therapy Treatment  Patient Details  Name: Reginald Rios MRN: [id number] Date of Birth: April 09, 1956 Referring Provider (PT): Frankey Shown, MD   Encounter Date: 09/26/2019  PT End of Session - 09/26/19 1353    Visit Number  2    Number of Visits  13    Date for PT Re-Evaluation  10/30/19    Authorization Type  [facility]    PT Start Time  1306    PT Stop Time  1351    PT Time Calculation (min)  45 min    Activity Tolerance  Patient tolerated treatment well    Behavior During Therapy  [facility] for tasks assessed/performed       Past Medical History:  Diagnosis Date  . ADD (attention deficit disorder)   . Anemia   . Arthritis   . CAD (coronary artery disease)    2/19 PCI/DESx1 to Lcx  . Diabetes mellitus without complication ([place])   . Exercise-induced asthma   . Heart murmur   . Hyperlipidemia    family hx of high cholesterol  . Hypogonadism in male   . Low back pain   . Osteoarthritis     Past Surgical History:  Procedure Laterality Date  . CORONARY STENT INTERVENTION N/A 06/23/2017   Procedure: CORONARY STENT INTERVENTION;  Surgeon: Leonie Man, MD;  Location: [facility];  Service: Cardiovascular;  Laterality: N/A;  . INTRAVASCULAR PRESSURE WIRE/FFR STUDY N/A 06/23/2017   Procedure: INTRAVASCULAR PRESSURE WIRE/FFR STUDY;  Surgeon: Leonie Man, MD;  Location: [facility];  Service: Cardiovascular;  Laterality: N/A;  . KNEE ARTHROSCOPY    . LEFT HEART CATH AND CORONARY ANGIOGRAPHY N/A 06/23/2017   Procedure: LEFT HEART CATH AND CORONARY ANGIOGRAPHY;  Surgeon: Leonie Man, MD;  Location: [facility];  Service: Cardiovascular;  Laterality: N/A;  . SHOULDER ARTHROSCOPY WITH BICEPSTENOTOMY Right 09/06/2019   Procedure: SHOULDER ARTHROSCOPY WITH BICEPSTENOTOMY;  Surgeon: Leandrew Koyanagi,  MD;  Location: [facility];  Service: Orthopedics;  Laterality: Right;  . SHOULDER ARTHROSCOPY WITH SUBACROMIAL DECOMPRESSION Right 09/06/2019   Procedure: RIGHT SHOULDER ARTHROSCOPY WITH EXTENSIVE DEBRIDEMENT, SUBACROMIAL DECOMPRESSION, BICEPS TENOTOMY;  Surgeon: Leandrew Koyanagi, MD;  Location: [facility];  Service: Orthopedics;  Laterality: Right;  . TENOTOMY ACHILLES TENDON      There were no vitals filed for this visit.  Subjective Assessment - 09/26/19 1307    Subjective  Did his HEP this AM and having some residual shoulder pain. Saw a chiropractor yesterday for LBP.    Pertinent History  OA, LBP, HLD, heart murmur, asthma, DM, CAD, anemia, ADD, achilles tenotomy, L heart cath & angio 06/2017, coronary stent 06/2017, C4-6 fusion    Diagnostic tests  none recent    Patient Stated Goals  "Being able to lift something, comb my hair"    Currently in Pain?  Yes    Pain Score  5     Pain Location  Shoulder    Pain Orientation  Right;Lateral    Pain Descriptors / Indicators  Sharp    Pain Type  Acute pain;Surgical pain         [facility] PT Assessment - 09/26/19 0001      Observation/Other Assessments   Focus on Therapeutic Outcomes (FOTO)   Shoulder: 41 (59% limited, 30% predicted)  [facility] Adult PT Treatment/Exercise - 09/26/19 0001      Exercises   Exercises  Elbow;Shoulder      Elbow Exercises   Elbow Flexion  PROM;AROM;Right;5 reps;Seated    Elbow Flexion Limitations  R elbow flexion/extension PROM x5, AROM x5    Forearm Supination  PROM;AROM;Right;5 reps;Seated    Forearm Supination Limitations  R elbow supination/pronation PROM x5, AROM x5      Shoulder Exercises: Supine   Protraction  Strengthening;Both;10 reps    Protraction Weight (lbs)  2    Protraction Limitations  supine serratus punches    External Rotation  AAROM;Right;10 reps    External Rotation Limitations  cues to maintain shoulder in neutral    Flexion   AAROM;Right;10 reps    Flexion Limitations  supine with wand; to tolerance    ABduction  AAROM;Right;10 reps    ABduction Limitations  supine with wand; slight scaption for improved tolerance      Shoulder Exercises: Sidelying   External Rotation  AROM;Right;10 reps    External Rotation Limitations  shoulder in neutral'; ROM to tolerance   c/o R UT tightness    ABduction  AROM;Right;10 reps    ABduction Limitations  thumb up; to tolerance      Shoulder Exercises: Pulleys   Flexion  3 minutes    Flexion Limitations  to tolerance    Scaption  3 minutes    Scaption Limitations  to tolerance      Manual Therapy   Manual Therapy  Myofascial release    Manual therapy comments  supine    Myofascial Release  manual TPR to R posterior deltoid- TTP over this region with good improvement in pain             PT Education - 09/26/19 1352    Education Details  update to HEP; discussion on avoiding heavy lifting, overhead reaching, or repeated overhead activities.    Person(s) Educated  Patient    Methods  Explanation;Demonstration;Tactile cues;Verbal cues;Handout    Comprehension  Verbalized understanding;Returned demonstration       PT Short Term Goals - 09/26/19 1356      PT SHORT TERM GOAL #1   Title  Patient to be independent with initial HEP.    Time  3    Period  Weeks    Status  On-going    Target Date  10/09/19        PT Long Term Goals - 09/26/19 1356      PT LONG TERM GOAL #1   Title  Patient to be independent with advanced HEP.    Time  6    Period  Weeks    Status  On-going      PT LONG TERM GOAL #2   Title  Patient to demonstrate [facility] and pain-free R shoulder AROM/PROM.    Time  6    Period  Weeks    Status  On-going      PT LONG TERM GOAL #3   Title  Patient to demonstrate >=4+/5 strength in R UE and demonstrate symmetrical grip strength in B hands.    Time  6    Period  Weeks    Status  On-going      PT LONG TERM GOAL #4   Title  Patient to  report 70% improvement in pain levels with overhead reaching.    Time  6    Period  Weeks    Status  On-going  PT LONG TERM GOAL #5   Title  Patient to return to pain-free pickleball with modifications as needed.    Time  6    Period  Weeks    Status  On-going            Plan - 09/26/19 1355    Clinical Impression Statement  Patient without new complaints today. Noting that he has the tendency to "over-do" things when he is feeling better, thus advised patient on avoiding heavy lifting, overhead reaching, or repeated overhead activities. Reviewed R elbow PROM/AROM with patient demonstrating good tolerance- still with limitation in extension and supination. Worked on shoulder AAROM with patient demonstrating good ROM. Cues for proper form and decreased speed required throughout ther-ex, as patient's tendency is to perform exercises too quickly. Provided manual TPR to R posterior deltoid- patient TTP over this region but with good improvement in pain after MT. Initiated sidelying shoulder AROM with patient demonstrating improvement in shoulder abduction tolerance compared to previous pre-op sessions. Updated HEP with exercises that were well-tolerated today. Patient reported understanding and without complaints at end of session.    Comorbidities  OA, LBP, HLD, heart murmur, asthma, DM, CAD, anemia, ADD, achilles tenotomy, L heart cath & angio 06/2017, coronary stent 06/2017, C4-6 fusion    PT Treatment/Interventions  ADLs/Self Care Home Management;Cryotherapy;Electrical Stimulation;Moist Heat;Therapeutic exercise;Therapeutic activities;Functional mobility training;Ultrasound;Neuromuscular re-education;Patient/family education;Manual techniques;Vasopneumatic Device;Taping;Energy conservation;Dry needling;Passive range of motion    PT Next Visit Plan  work on R elbow and shoulder PROM/AAROM to tolerance    Consulted and Agree with Plan of Care  Patient       Patient will benefit from  skilled therapeutic intervention in order to improve the following deficits and impairments:  Hypomobility, Impaired sensation, Decreased activity tolerance, Decreased strength, Increased fascial restricitons, Impaired UE functional use, Pain, Improper body mechanics, Decreased range of motion, Postural dysfunction, Impaired flexibility, Decreased scar mobility  Visit Diagnosis: Acute pain of right shoulder  Stiffness of right shoulder, not elsewhere classified  Stiffness of right elbow, not elsewhere classified  Abnormal posture     Problem List Patient Active Problem List   Diagnosis Date Noted  . Myofascial pain syndrome, cervical 07/28/2019  . Lateral epicondylitis of right elbow 07/28/2019  . Nontraumatic incomplete tear of right rotator cuff 07/25/2019  . Tendinopathy of right biceps tendon 07/25/2019  . Erectile dysfunction 07/11/2019  . Mild persistent asthma/cough variant asthma 04/13/2019  . Allergic rhinitis with a possible nonallergic component 04/13/2019  . Acid reflux 04/13/2019  . Cough 04/13/2019  . Hoarseness 04/13/2019  . Cervical radicular pain 01/10/2019  . Ulnar neuropathy at elbow of right upper extremity 01/03/2019  . Acute bilateral low back pain without sciatica 01/03/2019  . Onychomycosis 05/25/2018  . Palpitations 07/06/2017  . CAD (coronary artery disease) 07/05/2017  . Family history of early CAD 07/05/2017  . Abnormal findings on diagnostic imaging of cardiovascular system 06/23/2017  . Low vitamin D level 04/22/2017  . Pain in right ankle and joints of right foot 10/05/2016  . Hemarthrosis of right elbow 02/03/2016  . Small thenar eminence 02/03/2016  . Cervical disc disorder with radiculopathy of cervical region 10/07/2015  . Incomplete rotator cuff tear 09/10/2015  . Nonischemic cardiomyopathy ([place]) 05/15/2015  . Fatigue 05/15/2015  . Dyspnea on exertion 05/15/2015  . Decreased cardiac ejection fraction 04/02/2015  . Strain of latissimus  dorsi muscle 01/21/2015  . Shoulder bursitis 10/12/2014  . Stenosis of lateral recess of lumbar spine 10/12/2014  . BPPV (benign paroxysmal  positional vertigo) 08/14/2014  . Hyperlipidemia 01/19/2014  . Anemia, unspecified 01/19/2014  . ADD (attention deficit disorder) 01/19/2014  . Type 2 diabetes mellitus with hyperglycemia, without long-term current use of insulin ([place]) 12/22/2013  . Hypogonadism in male 12/22/2013  . Arthritis of hand, left 12/22/2013     Janene Harvey, PT, DPT 09/26/19 1:57 PM   [facility] [address]  [place], [place], [address] Phone: [phone]   Fax:  [phone]  Name: Reginald Rios MRN: [id number] Date of Birth: [birth year]

## 2019-09-28 DIAGNOSIS — H52223 Regular astigmatism, bilateral: Secondary | ICD-10-CM | POA: Diagnosis not present

## 2019-09-28 DIAGNOSIS — H5213 Myopia, bilateral: Secondary | ICD-10-CM | POA: Diagnosis not present

## 2019-09-28 DIAGNOSIS — H524 Presbyopia: Secondary | ICD-10-CM | POA: Diagnosis not present

## 2019-09-28 DIAGNOSIS — H43811 Vitreous degeneration, right eye: Secondary | ICD-10-CM | POA: Diagnosis not present

## 2019-10-03 MED FILL — PREGABALIN 75 MG CAPS: 75 | 30 days supply | Qty: 60 | Fill #2

## 2019-10-04 ENCOUNTER — Other Ambulatory Visit: Payer: Self-pay

## 2019-10-04 ENCOUNTER — Ambulatory Visit: Payer: 59 | Attending: Family Medicine

## 2019-10-04 DIAGNOSIS — R293 Abnormal posture: Secondary | ICD-10-CM | POA: Insufficient documentation

## 2019-10-04 DIAGNOSIS — M25621 Stiffness of right elbow, not elsewhere classified: Secondary | ICD-10-CM | POA: Diagnosis not present

## 2019-10-04 DIAGNOSIS — M25511 Pain in right shoulder: Secondary | ICD-10-CM | POA: Diagnosis not present

## 2019-10-04 DIAGNOSIS — M25611 Stiffness of right shoulder, not elsewhere classified: Secondary | ICD-10-CM | POA: Insufficient documentation

## 2019-10-04 NOTE — Therapy (Signed)
[facility] [address]  [place], [place], [address] Phone: [phone]   Fax:  [phone]  Physical Therapy Treatment  Patient Details  Name: Reginald Rios MRN: [id number] Date of Birth: 04/04/1956 Referring Provider (PT): Reginald Shown, MD   Encounter Date: 10/04/2019  PT End of Session - 10/04/19 1323    Visit Number  3    Number of Visits  13    Date for PT Re-Evaluation  10/30/19    Authorization Type  [facility]    PT Start Time  [id number]    PT Stop Time  [id number]    PT Time Calculation (min)  46 min    Activity Tolerance  Patient tolerated treatment well    Behavior During Therapy  [facility] for tasks assessed/performed       Past Medical History:  Diagnosis Date   ADD (attention deficit disorder)    Anemia    Arthritis    CAD (coronary artery disease)    2/19 PCI/DESx1 to Lcx   Diabetes mellitus without complication ([place])    Exercise-induced asthma    Heart murmur    Hyperlipidemia    family hx of high cholesterol   Hypogonadism in male    Low back pain    Osteoarthritis     Past Surgical History:  Procedure Laterality Date   CORONARY STENT INTERVENTION N/A 06/23/2017   Procedure: CORONARY STENT INTERVENTION;  Surgeon: Leonie Man, MD;  Location: [place] [facility];  Service: Cardiovascular;  Laterality: N/A;   INTRAVASCULAR PRESSURE WIRE/FFR STUDY N/A 06/23/2017   Procedure: INTRAVASCULAR PRESSURE WIRE/FFR STUDY;  Surgeon: Leonie Man, MD;  Location: [place] [facility];  Service: Cardiovascular;  Laterality: N/A;   KNEE ARTHROSCOPY     LEFT HEART CATH AND CORONARY ANGIOGRAPHY N/A 06/23/2017   Procedure: LEFT HEART CATH AND CORONARY ANGIOGRAPHY;  Surgeon: Leonie Man, MD;  Location: [place] [facility];  Service: Cardiovascular;  Laterality: N/A;   SHOULDER ARTHROSCOPY WITH BICEPSTENOTOMY Right 09/06/2019   Procedure: SHOULDER ARTHROSCOPY WITH BICEPSTENOTOMY;  Surgeon: Leandrew Koyanagi,  MD;  Location: [place];  Service: Orthopedics;  Laterality: Right;   SHOULDER ARTHROSCOPY WITH SUBACROMIAL DECOMPRESSION Right 09/06/2019   Procedure: RIGHT SHOULDER ARTHROSCOPY WITH EXTENSIVE DEBRIDEMENT, SUBACROMIAL DECOMPRESSION, BICEPS TENOTOMY;  Surgeon: Leandrew Koyanagi, MD;  Location: [place];  Service: Orthopedics;  Laterality: Right;   TENOTOMY ACHILLES TENDON      There were no vitals filed for this visit.  Subjective Assessment - 10/04/19 1332    Subjective  Pt. having some questions regarding his updated HEP.    Pertinent History  OA, LBP, HLD, heart murmur, asthma, DM, CAD, anemia, ADD, achilles tenotomy, L heart cath & angio 06/2017, coronary stent 06/2017, C4-6 fusion    Diagnostic tests  none recent    Patient Stated Goals  "Being able to lift something, comb my hair"    Currently in Pain?  Yes    Pain Score  2     Pain Location  Shoulder    Pain Orientation  Right    Pain Descriptors / Indicators  --   " stiff "   Pain Type  Acute pain;Surgical pain                        [facility] Adult PT Treatment/Exercise - 10/04/19 0001      Shoulder Exercises: Supine   External Rotation  AAROM;Right;10 reps    External Rotation Limitations  cues for proper motion with wand     Flexion  AAROM;Right;10 reps    Flexion Limitations  supine with wand; to tolerance    ABduction  AAROM;Right;10 reps    ABduction Limitations  supine with wand; slight scaption for improved tolerance      Shoulder Exercises: Prone   Retraction  Right;10 reps;AROM    Retraction Weight (lbs)  2    Retraction Limitations  improved scapular retraction/depression with tc     Extension  Right;10 reps    Extension Limitations  improved scapular retraction/depression after cueing       Shoulder Exercises: Sidelying   External Rotation  AROM;Right;10 reps    External Rotation Limitations  shoulder in neutral'; ROM to tolerance    ABduction  AROM;Right;10 reps     ABduction Limitations  thumb up; to tolerance    Other Sidelying Exercises  L sidelying R horizontal abduction x 10 rpes    heavy tc for scap retraction/depression      Shoulder Exercises: Pulleys   Flexion  3 minutes    Flexion Limitations  to tolerance    Scaption  3 minutes    Scaption Limitations  to tolerance               PT Short Term Goals - 09/26/19 1356      PT SHORT TERM GOAL #1   Title  Patient to be independent with initial HEP.    Time  3    Period  Weeks    Status  On-going    Target Date  10/09/19        PT Long Term Goals - 09/26/19 1356      PT LONG TERM GOAL #1   Title  Patient to be independent with advanced HEP.    Time  6    Period  Weeks    Status  On-going      PT LONG TERM GOAL #2   Title  Patient to demonstrate [facility] and pain-free R shoulder AROM/PROM.    Time  6    Period  Weeks    Status  On-going      PT LONG TERM GOAL #3   Title  Patient to demonstrate >=4+/5 strength in R UE and demonstrate symmetrical grip strength in B hands.    Time  6    Period  Weeks    Status  On-going      PT LONG TERM GOAL #4   Title  Patient to report 70% improvement in pain levels with overhead reaching.    Time  6    Period  Weeks    Status  On-going      PT LONG TERM GOAL #5   Title  Patient to return to pain-free pickleball with modifications as needed.    Time  6    Period  Weeks    Status  On-going            Plan - 10/04/19 1323    Clinical Impression Statement  [name] reporting much improved R shoulder comfort over last few days.  Was able to demo improved R scapular retraction/depression activation with tactile cueing today with sidelying ER, sidelying abduction, sidelying horizontal abduction - all exercises performed to pt. tolerance pain free.  Ended visit pain free.  Deferred further HEP review as pt. with some difficulty performing sidelying activities with proper scapular mechanics with tactile guidance today.  Comorbidities  OA, LBP, HLD, heart murmur, asthma, DM, CAD, anemia, ADD, achilles tenotomy, L heart cath & angio 06/2017, coronary stent 06/2017, C4-6 fusion    Rehab Potential  Good    PT Frequency  2x / week    PT Treatment/Interventions  ADLs/Self Care Home Management;Cryotherapy;Electrical Stimulation;Moist Heat;Therapeutic exercise;Therapeutic activities;Functional mobility training;Ultrasound;Neuromuscular re-education;Patient/family education;Manual techniques;Vasopneumatic Device;Taping;Energy conservation;Dry needling;Passive range of motion    PT Next Visit Plan  work on R elbow and shoulder PROM/AAROM to tolerance    Consulted and Agree with Plan of Care  Patient       Patient will benefit from skilled therapeutic intervention in order to improve the following deficits and impairments:  Hypomobility, Impaired sensation, Decreased activity tolerance, Decreased strength, Increased fascial restricitons, Impaired UE functional use, Pain, Improper body mechanics, Decreased range of motion, Postural dysfunction, Impaired flexibility, Decreased scar mobility  Visit Diagnosis: Acute pain of right shoulder  Stiffness of right shoulder, not elsewhere classified  Stiffness of right elbow, not elsewhere classified  Abnormal posture     Problem List Patient Active Problem List   Diagnosis Date Noted   Myofascial pain syndrome, cervical 07/28/2019   Lateral epicondylitis of right elbow 07/28/2019   Nontraumatic incomplete tear of right rotator cuff 07/25/2019   Tendinopathy of right biceps tendon 07/25/2019   Erectile dysfunction 07/11/2019   Mild persistent asthma/cough variant asthma 04/13/2019   Allergic rhinitis with a possible nonallergic component 04/13/2019   Acid reflux 04/13/2019   Cough 04/13/2019   Hoarseness 04/13/2019   Cervical radicular pain 01/10/2019   Ulnar neuropathy at elbow of right upper extremity 01/03/2019   Acute bilateral low back pain without  sciatica 01/03/2019   Onychomycosis 05/25/2018   Palpitations 07/06/2017   CAD (coronary artery disease) 07/05/2017   Family history of early CAD 07/05/2017   Abnormal findings on diagnostic imaging of cardiovascular system 06/23/2017   Low vitamin D level 04/22/2017   Pain in right ankle and joints of right foot 10/05/2016   Hemarthrosis of right elbow 02/03/2016   Small thenar eminence 02/03/2016   Cervical disc disorder with radiculopathy of cervical region 10/07/2015   Incomplete rotator cuff tear 09/10/2015   Nonischemic cardiomyopathy ([place]) 05/15/2015   Fatigue 05/15/2015   Dyspnea on exertion 05/15/2015   Decreased cardiac ejection fraction 04/02/2015   Strain of latissimus dorsi muscle 01/21/2015   Shoulder bursitis 10/12/2014   Stenosis of lateral recess of lumbar spine 10/12/2014   BPPV (benign paroxysmal positional vertigo) 08/14/2014   Hyperlipidemia 01/19/2014   Anemia, unspecified 01/19/2014   ADD (attention deficit disorder) 01/19/2014   Type 2 diabetes mellitus with hyperglycemia, without long-term current use of insulin ([place]) 12/22/2013   Hypogonadism in male 12/22/2013   Arthritis of hand, left 12/22/2013    Bess Harvest, PTA 10/04/19 3:08 PM   [facility] [address]  [place], [place], [address] Phone: [phone]   Fax:  [phone]  Name: Reginald Rios MRN: [id number] Date of Birth: [birth year]

## 2019-10-09 ENCOUNTER — Other Ambulatory Visit: Payer: Self-pay

## 2019-10-09 ENCOUNTER — Ambulatory Visit: Payer: 59

## 2019-10-09 DIAGNOSIS — M25511 Pain in right shoulder: Secondary | ICD-10-CM

## 2019-10-09 DIAGNOSIS — R293 Abnormal posture: Secondary | ICD-10-CM

## 2019-10-09 DIAGNOSIS — M25621 Stiffness of right elbow, not elsewhere classified: Secondary | ICD-10-CM | POA: Diagnosis not present

## 2019-10-09 DIAGNOSIS — M25611 Stiffness of right shoulder, not elsewhere classified: Secondary | ICD-10-CM

## 2019-10-09 NOTE — Therapy (Signed)
[facility] [address]  [place], [place], [address] Phone: [phone]   Fax:  [phone]  Physical Therapy Treatment  Patient Details  Name: Reginald Rios MRN: [id number] Date of Birth: 02/05/1956 Referring Provider (PT): Frankey Shown, MD   Encounter Date: 10/09/2019  PT End of Session - 10/09/19 1508    Visit Number  4    Number of Visits  13    Date for PT Re-Evaluation  10/30/19    Authorization Type  [facility]    PT Start Time  1446    PT Stop Time  1600    PT Time Calculation (min)  74 min    Activity Tolerance  Patient tolerated treatment well    Behavior During Therapy  [facility] for tasks assessed/performed       Past Medical History:  Diagnosis Date  . ADD (attention deficit disorder)   . Anemia   . Arthritis   . CAD (coronary artery disease)    2/19 PCI/DESx1 to Lcx  . Diabetes mellitus without complication ([place])   . Exercise-induced asthma   . Heart murmur   . Hyperlipidemia    family hx of high cholesterol  . Hypogonadism in male   . Low back pain   . Osteoarthritis     Past Surgical History:  Procedure Laterality Date  . CORONARY STENT INTERVENTION N/A 06/23/2017   Procedure: CORONARY STENT INTERVENTION;  Surgeon: Leonie Man, MD;  Location: [place] [facility];  Service: Cardiovascular;  Laterality: N/A;  . INTRAVASCULAR PRESSURE WIRE/FFR STUDY N/A 06/23/2017   Procedure: INTRAVASCULAR PRESSURE WIRE/FFR STUDY;  Surgeon: Leonie Man, MD;  Location: [place] [facility];  Service: Cardiovascular;  Laterality: N/A;  . KNEE ARTHROSCOPY    . LEFT HEART CATH AND CORONARY ANGIOGRAPHY N/A 06/23/2017   Procedure: LEFT HEART CATH AND CORONARY ANGIOGRAPHY;  Surgeon: Leonie Man, MD;  Location: [place] [facility];  Service: Cardiovascular;  Laterality: N/A;  . SHOULDER ARTHROSCOPY WITH BICEPSTENOTOMY Right 09/06/2019   Procedure: SHOULDER ARTHROSCOPY WITH BICEPSTENOTOMY;  Surgeon: Leandrew Koyanagi,  MD;  Location: [place];  Service: Orthopedics;  Laterality: Right;  . SHOULDER ARTHROSCOPY WITH SUBACROMIAL DECOMPRESSION Right 09/06/2019   Procedure: RIGHT SHOULDER ARTHROSCOPY WITH EXTENSIVE DEBRIDEMENT, SUBACROMIAL DECOMPRESSION, BICEPS TENOTOMY;  Surgeon: Leandrew Koyanagi, MD;  Location: [place];  Service: Orthopedics;  Laterality: Right;  . TENOTOMY ACHILLES TENDON      There were no vitals filed for this visit.  Subjective Assessment - 10/09/19 1505    Subjective  Doing ok.  Had some R shoulder soreness after last visit.  Went to swimming pool and swam for a bit and had increased soreness.    Pertinent History  OA, LBP, HLD, heart murmur, asthma, DM, CAD, anemia, ADD, achilles tenotomy, L heart cath & angio 06/2017, coronary stent 06/2017, C4-6 fusion    Currently in Pain?  Yes    Pain Score  3     Pain Location  Shoulder    Pain Orientation  Right    Pain Descriptors / Indicators  Sore    Pain Type  Acute pain;Surgical pain         [facility] PT Assessment - 10/09/19 0001      AROM   Right Shoulder Flexion  117 Degrees    Right Shoulder ABduction  125 Degrees    Right Shoulder Internal Rotation  --   FIR to T11  Right Shoulder External Rotation  --   FER to C6     PROM   Right/Left Shoulder  Right    Right Shoulder Flexion  140 Degrees    Right Shoulder ABduction  135 Degrees    Right Shoulder Internal Rotation  77 Degrees    Right Shoulder External Rotation  60 Degrees      Strength   Right/Left Shoulder  Right    Right Shoulder Flexion  4-/5    Right Shoulder ABduction  4/5    Right Shoulder Internal Rotation  4/5    Right Shoulder External Rotation  4-/5    Left Shoulder Flexion  4+/5    Left Shoulder ABduction  4+/5    Left Shoulder Internal Rotation  4/5    Left Shoulder External Rotation  4+/5    Right/Left Elbow  Right    Right Elbow Flexion  4-/5    Right/Left Wrist  Right;Left    Right Wrist Flexion  4+/5    Right Wrist  Extension  4+/5    Left Wrist Flexion  5/5    Left Wrist Extension  5/5    Right/Left hand  Right;Left    Right Hand Grip (lbs)  37.33   # 39, #38, #35   Left Hand Grip (lbs)  47.33   42#, 48#, 52#                   [facility] Adult PT Treatment/Exercise - 10/09/19 0001      Self-Care   Self-Care  Other Self-Care Comments    Other Self-Care Comments   Discussion of appropriate activities for ROM with HEP and to avoid swimming (dog paddle, free-style) as pt. admitting to increased R shoulder pain after swimming      Elbow Exercises   Elbow Flexion  PROM;AROM;Right;Seated;10 reps    Elbow Flexion Limitations  R elbow PROM, AROM x 10      Shoulder Exercises: Supine   Protraction  Right;15 reps;Strengthening    Protraction Weight (lbs)  --   with red med ball (1000Gr)   Protraction Limitations  supine     External Rotation  AAROM;Right;10 reps    External Rotation Limitations  cues for proper motion with wand     Flexion  AAROM;Right;10 reps    Flexion Limitations  supine with wand; to tolerance    ABduction  AAROM;Right;10 reps    ABduction Limitations  supine with wand; slight scaption for improved tolerance      Shoulder Exercises: Prone   Extension  Right;15 reps;Strengthening    Extension Limitations  leaning over counter       Shoulder Exercises: Pulleys   Flexion  3 minutes    Flexion Limitations  to tolerance    Scaption  3 minutes    Scaption Limitations  to tolerance      Shoulder Exercises: ROM/Strengthening   Other ROM/Strengthening Exercises  Standing R shoulder pendulums with red med ball (1000Gr) x 30 sec CW, CCW, forward/backwards x 30 sec each way       Manual Therapy   Manual Therapy  Myofascial release    Myofascial Release  TPR to R UT               PT Short Term Goals - 10/09/19 1509      PT SHORT TERM GOAL #1   Title  Patient to be independent with initial HEP.    Time  3    Period  Weeks  Status  Achieved    Target Date   10/09/19        PT Long Term Goals - 10/09/19 1558      PT LONG TERM GOAL #1   Title  Patient to be independent with advanced HEP.    Time  6    Period  Weeks    Status  On-going      PT LONG TERM GOAL #2   Title  Patient to demonstrate [facility] and pain-free R shoulder AROM/PROM.    Time  6    Period  Weeks    Status  On-going      PT LONG TERM GOAL #3   Title  Patient to demonstrate >=4+/5 strength in R UE and demonstrate symmetrical grip strength in B hands.    Time  6    Period  Weeks    Status  On-going      PT LONG TERM GOAL #4   Title  Patient to report 70% improvement in pain levels with overhead reaching.    Time  6    Period  Weeks    Status  On-going   10/09/19: notes 15% improvement     PT LONG TERM GOAL #5   Title  Patient to return to pain-free pickleball with modifications as needed.    Time  6    Period  Weeks    Status  On-going            Plan - 10/09/19 1509    Clinical Impression Statement  Pt. able to demo good progress with physical therapy.  Able to demo improved R shoulder PROM/AROM in nearly all motions most notable in R PROM abduction, ER.  Notes he is performing HEP supine wand ROM activities along with updated HEP with sidelying scapular re-training activities without issue.  Does note ~ three days of soreness over the weekend after attempting "extra reps" of HEP activities and swimming (dog paddling).  Notes improved shoulder soreness yesterday and today.  Tolerated all gentle ROM and periscapular strengthening activities well today.  Improved scapular activation during therex with cueing for scapular retraction/depression from therapist.  Pt. progressing toward LTG #2.  Progressing toward LTG #3 as pt. demonstrating grossly 4-/5-4/5 R UE strength and notes decreased pain.  Notes 15% improvement in overhead reaching since starting therapy.  Progressing toward LTG #4.  Pt. encouraged to avoid R shoulder exerting activities such as swimming and  focus on gentle ROM HEP to avoid R UT irritation.  Pt. to see MD for f/u on Thursday.  Will continue to benefit from further skilled therapy to maximize UE functional ROM and strength.    Comorbidities  OA, LBP, HLD, heart murmur, asthma, DM, CAD, anemia, ADD, achilles tenotomy, L heart cath & angio 06/2017, coronary stent 06/2017, C4-6 fusion    Rehab Potential  Good    PT Frequency  2x / week    PT Treatment/Interventions  ADLs/Self Care Home Management;Cryotherapy;Electrical Stimulation;Moist Heat;Therapeutic exercise;Therapeutic activities;Functional mobility training;Ultrasound;Neuromuscular re-education;Patient/family education;Manual techniques;Vasopneumatic Device;Taping;Energy conservation;Dry needling;Passive range of motion    PT Next Visit Plan  work on R elbow and shoulder PROM/AAROM to tolerance    Consulted and Agree with Plan of Care  Patient       Patient will benefit from skilled therapeutic intervention in order to improve the following deficits and impairments:  Hypomobility, Impaired sensation, Decreased activity tolerance, Decreased strength, Increased fascial restricitons, Impaired UE functional use, Pain, Improper body mechanics, Decreased range of motion, Postural  dysfunction, Impaired flexibility, Decreased scar mobility  Visit Diagnosis: Acute pain of right shoulder  Stiffness of right shoulder, not elsewhere classified  Stiffness of right elbow, not elsewhere classified  Abnormal posture     Problem List Patient Active Problem List   Diagnosis Date Noted  . Myofascial pain syndrome, cervical 07/28/2019  . Lateral epicondylitis of right elbow 07/28/2019  . Nontraumatic incomplete tear of right rotator cuff 07/25/2019  . Tendinopathy of right biceps tendon 07/25/2019  . Erectile dysfunction 07/11/2019  . Mild persistent asthma/cough variant asthma 04/13/2019  . Allergic rhinitis with a possible nonallergic component 04/13/2019  . Acid reflux 04/13/2019  .  Cough 04/13/2019  . Hoarseness 04/13/2019  . Cervical radicular pain 01/10/2019  . Ulnar neuropathy at elbow of right upper extremity 01/03/2019  . Acute bilateral low back pain without sciatica 01/03/2019  . Onychomycosis 05/25/2018  . Palpitations 07/06/2017  . CAD (coronary artery disease) 07/05/2017  . Family history of early CAD 07/05/2017  . Abnormal findings on diagnostic imaging of cardiovascular system 06/23/2017  . Low vitamin D level 04/22/2017  . Pain in right ankle and joints of right foot 10/05/2016  . Hemarthrosis of right elbow 02/03/2016  . Small thenar eminence 02/03/2016  . Cervical disc disorder with radiculopathy of cervical region 10/07/2015  . Incomplete rotator cuff tear 09/10/2015  . Nonischemic cardiomyopathy ([place]) 05/15/2015  . Fatigue 05/15/2015  . Dyspnea on exertion 05/15/2015  . Decreased cardiac ejection fraction 04/02/2015  . Strain of latissimus dorsi muscle 01/21/2015  . Shoulder bursitis 10/12/2014  . Stenosis of lateral recess of lumbar spine 10/12/2014  . BPPV (benign paroxysmal positional vertigo) 08/14/2014  . Hyperlipidemia 01/19/2014  . Anemia, unspecified 01/19/2014  . ADD (attention deficit disorder) 01/19/2014  . Type 2 diabetes mellitus with hyperglycemia, without long-term current use of insulin ([place]) 12/22/2013  . Hypogonadism in male 12/22/2013  . Arthritis of hand, left 12/22/2013    Bess Harvest, PTA 10/09/19 4:16 PM   [facility] [address]  [place], [place], [address] Phone: [phone]   Fax:  [phone]  Name: MESSIYAH WATERSON MRN: [id number] Date of Birth: 03/04/1956

## 2019-10-11 ENCOUNTER — Ambulatory Visit: Payer: 59 | Admitting: Orthopaedic Surgery

## 2019-10-11 ENCOUNTER — Encounter: Payer: Self-pay | Admitting: Orthopaedic Surgery

## 2019-10-11 VITALS — Ht 70.0 in | Wt 198.0 lb

## 2019-10-11 DIAGNOSIS — G8929 Other chronic pain: Secondary | ICD-10-CM

## 2019-10-11 DIAGNOSIS — M25511 Pain in right shoulder: Secondary | ICD-10-CM

## 2019-10-11 DIAGNOSIS — M67921 Unspecified disorder of synovium and tendon, right upper arm: Secondary | ICD-10-CM

## 2019-10-11 DIAGNOSIS — M75111 Incomplete rotator cuff tear or rupture of right shoulder, not specified as traumatic: Secondary | ICD-10-CM

## 2019-10-11 NOTE — Progress Notes (Signed)
Post-Op Visit Note   Patient: Reginald Rios           Date of Birth: [birth year]           MRN: [id number] Visit Date: 10/11/2019 PCP: [name], DO   Assessment & Plan:  Chief Complaint:  Chief Complaint  Patient presents with  . Right Shoulder - Follow-up    Right shoulder scope DOS 09-13-2019   Visit Diagnoses:  1. Nontraumatic incomplete tear of right rotator cuff   2. Chronic right shoulder pain   3. Tendinopathy of right biceps tendon     Plan: Reginald Rios is 5 weeks status post right shoulder arthroscopy with debridement and decompression.  He is currently doing physical therapy twice a week at [facility].  He states that the pain is significantly improved.  He has good days and bad days.  He did try to do some swimming which aggravated the shoulder pain.  He has no real complaints otherwise.  Surgical scars are fully healed.  Range of motion is progressing although he is slightly limited with forward flexion.  Manual muscle testing is normal.  At this point he can continue with PT.  He can begin strengthening with PT if he has not already.  Would like to recheck him in 6 weeks.  He can gradually increase activity and try pickleball as tolerated.  Follow-Up Instructions: Return in about 6 weeks (around 11/22/2019).   Orders:  No orders of the defined types were placed in this encounter.  No orders of the defined types were placed in this encounter.   Imaging: No results found.  PMFS History: Patient Active Problem List   Diagnosis Date Noted  . Myofascial pain syndrome, cervical 07/28/2019  . Lateral epicondylitis of right elbow 07/28/2019  . Nontraumatic incomplete tear of right rotator cuff 07/25/2019  . Tendinopathy of right biceps tendon 07/25/2019  . Erectile dysfunction 07/11/2019  . Mild persistent asthma/cough variant asthma 04/13/2019  . Allergic rhinitis with a possible nonallergic component 04/13/2019  . Acid reflux 04/13/2019  .  Cough 04/13/2019  . Hoarseness 04/13/2019  . Cervical radicular pain 01/10/2019  . Ulnar neuropathy at elbow of right upper extremity 01/03/2019  . Acute bilateral low back pain without sciatica 01/03/2019  . Onychomycosis 05/25/2018  . Palpitations 07/06/2017  . CAD (coronary artery disease) 07/05/2017  . Family history of early CAD 07/05/2017  . Abnormal findings on diagnostic imaging of cardiovascular system 06/23/2017  . Low vitamin D level 04/22/2017  . Pain in right ankle and joints of right foot 10/05/2016  . Hemarthrosis of right elbow 02/03/2016  . Small thenar eminence 02/03/2016  . Cervical disc disorder with radiculopathy of cervical region 10/07/2015  . Incomplete rotator cuff tear 09/10/2015  . Nonischemic cardiomyopathy ([place]) 05/15/2015  . Fatigue 05/15/2015  . Dyspnea on exertion 05/15/2015  . Decreased cardiac ejection fraction 04/02/2015  . Strain of latissimus dorsi muscle 01/21/2015  . Shoulder bursitis 10/12/2014  . Stenosis of lateral recess of lumbar spine 10/12/2014  . BPPV (benign paroxysmal positional vertigo) 08/14/2014  . Hyperlipidemia 01/19/2014  . Anemia, unspecified 01/19/2014  . ADD (attention deficit disorder) 01/19/2014  . Type 2 diabetes mellitus with hyperglycemia, without long-term current use of insulin ([place]) 12/22/2013  . Hypogonadism in male 12/22/2013  . Arthritis of hand, left 12/22/2013   Past Medical History:  Diagnosis Date  . ADD (attention deficit disorder)   . Anemia   . Arthritis   . CAD (coronary  artery disease)    2/19 PCI/DESx1 to Lcx  . Diabetes mellitus without complication ([place])   . Exercise-induced asthma   . Heart murmur   . Hyperlipidemia    family hx of high cholesterol  . Hypogonadism in male   . Low back pain   . Osteoarthritis     Family History  Problem Relation Age of Onset  . Lung cancer Mother 29  . Cancer Mother        lung cancer  . Coronary artery disease Father 40  . Heart disease Father   .  Hypothyroidism Brother   . Food Allergy Son   . Asthma Paternal Aunt   . Colon cancer Neg Hx   . Prostate cancer Neg Hx   . Allergic rhinitis Neg Hx   . Angioedema Neg Hx   . Eczema Neg Hx   . Immunodeficiency Neg Hx   . Urticaria Neg Hx     Past Surgical History:  Procedure Laterality Date  . CORONARY STENT INTERVENTION N/A 06/23/2017   Procedure: CORONARY STENT INTERVENTION;  Surgeon: Leonie Man, MD;  Location: [place] [facility];  Service: Cardiovascular;  Laterality: N/A;  . INTRAVASCULAR PRESSURE WIRE/FFR STUDY N/A 06/23/2017   Procedure: INTRAVASCULAR PRESSURE WIRE/FFR STUDY;  Surgeon: Leonie Man, MD;  Location: [place] [facility];  Service: Cardiovascular;  Laterality: N/A;  . KNEE ARTHROSCOPY    . LEFT HEART CATH AND CORONARY ANGIOGRAPHY N/A 06/23/2017   Procedure: LEFT HEART CATH AND CORONARY ANGIOGRAPHY;  Surgeon: Leonie Man, MD;  Location: [place] [facility];  Service: Cardiovascular;  Laterality: N/A;  . SHOULDER ARTHROSCOPY WITH BICEPSTENOTOMY Right 09/06/2019   Procedure: SHOULDER ARTHROSCOPY WITH BICEPSTENOTOMY;  Surgeon: Leandrew Koyanagi, MD;  Location: [place];  Service: Orthopedics;  Laterality: Right;  . SHOULDER ARTHROSCOPY WITH SUBACROMIAL DECOMPRESSION Right 09/06/2019   Procedure: RIGHT SHOULDER ARTHROSCOPY WITH EXTENSIVE DEBRIDEMENT, SUBACROMIAL DECOMPRESSION, BICEPS TENOTOMY;  Surgeon: Leandrew Koyanagi, MD;  Location: [place];  Service: Orthopedics;  Laterality: Right;  . TENOTOMY ACHILLES TENDON     Social History   Occupational History  . Occupation: Air cabin crew   Tobacco Use  . Smoking status: Never Smoker  . Smokeless tobacco: Never Used  Substance and Sexual Activity  . Alcohol use: Yes    Alcohol/week: 0.0 standard drinks    Comment: rare  . Drug use: No  . Sexual activity: Not on file

## 2019-10-12 ENCOUNTER — Other Ambulatory Visit: Payer: Self-pay

## 2019-10-12 ENCOUNTER — Ambulatory Visit (INDEPENDENT_AMBULATORY_CARE_PROVIDER_SITE_OTHER): Payer: 59

## 2019-10-12 ENCOUNTER — Ambulatory Visit: Payer: 59 | Admitting: Podiatry

## 2019-10-12 DIAGNOSIS — M7752 Other enthesopathy of left foot: Secondary | ICD-10-CM

## 2019-10-12 DIAGNOSIS — B351 Tinea unguium: Secondary | ICD-10-CM

## 2019-10-12 DIAGNOSIS — B353 Tinea pedis: Secondary | ICD-10-CM

## 2019-10-12 DIAGNOSIS — M2041 Other hammer toe(s) (acquired), right foot: Secondary | ICD-10-CM

## 2019-10-12 MED ORDER — KETOCONAZOLE 2 % EX CREA: TOPICAL_CREAM | CUTANEOUS | 0 refills | Status: DC

## 2019-10-12 NOTE — Progress Notes (Signed)
  Subjective:  Patient ID: Reginald Rios, male    DOB: 01-Jun-1955,  MRN: [id number]  Chief Complaint  Patient presents with  . Foot Pain    Pt states left plantar forefoot sub 2/3 pain "Feels like walking on rocks".  . Nail Problem    Left 1st toenail "It is disintigrating"   . Hammer Toe    Right 2nd hammertoe  . Nail Problem    Left 1st lateral border occasional pain  . Foot Problem    Pt states bilateral dry skin on feet.    64 y.o. male presents with the above complaint. History confirmed with patient.   Objective:  Physical Exam: warm, good capillary refill, no trophic changes or ulcerative lesions, normal DP and PT pulses and normal sensory exam. Left Foot: tenderness of the 2nd metatarsal head xerosis with scaling plantar foot. Left hallux nail with thickening white powder dystrophy Right Foot: normal exam, no swelling, tenderness, instability; ligaments intact, full range of motion of all ankle/foot joints hammmertoe 2nd toe  No images are attached to the encounter.  Radiographs: X-ray of both feet: no fracture, dislocation, swelling or degenerative changes noted Assessment:   1. Capsulitis of metatarsophalangeal (MTP) joint of left foot   2. Hammertoe of right foot   3. Onychomycosis   4. Tinea pedis of both feet    Plan:  Patient was evaluated and treated and all questions answered.  Capsulitis -Educated on etiology -XR reviewed with patient -Discussed padding and proper shoegear -Offloading pad applied -Injection delivered to the painful joint  Hammertoe  -Educated on etiology  Procedure: Joint Injection Location: Left 2nd MPJ joint Skin Prep: Alcohol. Injectate: 0.5 cc 1% lidocaine plain, 0.5 cc dexamethasone phosphate. Disposition: Patient tolerated procedure well. Injection site dressed with a band-aid.   Onychomycosis, Tinea Pedis -Rx ketoconazole  Return in about 3 weeks (around 11/02/2019) for Capsulitis, Left.

## 2019-10-13 ENCOUNTER — Ambulatory Visit: Payer: 59 | Admitting: Family Medicine

## 2019-10-13 ENCOUNTER — Encounter: Payer: Self-pay | Admitting: Family Medicine

## 2019-10-13 VITALS — BP 112/64 | HR 103 | Temp 97.0°F | Ht 70.0 in | Wt 192.5 lb

## 2019-10-13 DIAGNOSIS — E1142 Type 2 diabetes mellitus with diabetic polyneuropathy: Secondary | ICD-10-CM | POA: Diagnosis not present

## 2019-10-13 DIAGNOSIS — Z794 Long term (current) use of insulin: Secondary | ICD-10-CM | POA: Diagnosis not present

## 2019-10-13 MED ORDER — FREESTYLE LITE TEST VI STRP: ORAL_STRIP | 3 refills | Status: DC

## 2019-10-13 NOTE — Patient Instructions (Addendum)
Give [place] 2-3 business days to get the results of your labs back.   I am glad to hear about your improved health!  Let us know if you need anything.

## 2019-10-13 NOTE — Progress Notes (Signed)
Subjective:   Chief Complaint  Patient presents with  . Follow-up    Reginald Rios is a 64 y.o. male here for follow-up of diabetes.   Reginald Rios's self monitored glucose range is low 100's Patient denies hypoglycemic reactions. He checks his glucose levels intermittently.  Patient does not require insulin.   Medications include: Januvia 100 mg/d, Actos 30 mg/d, Farxiga 10 mg/d, metformin 1000 mg bid Exercise: walking, cycling again Last a1c 8.8.   Past Medical History:  Diagnosis Date  . ADD (attention deficit disorder)   . Anemia   . Arthritis   . CAD (coronary artery disease)    2/19 PCI/DESx1 to Lcx  . Diabetes mellitus without complication ([place])   . Exercise-induced asthma   . Heart murmur   . Hyperlipidemia    family hx of high cholesterol  . Hypogonadism in male   . Low back pain   . Osteoarthritis      Related testing: Date of retinal exam: Done Pneumovax: done  Objective:  BP 112/64 (BP Location: Left Arm, Patient Position: Sitting, Cuff Size: Normal)   Pulse (!) 103   Temp (!) 97 F (36.1 C) (Temporal)   Ht 5\' 10"  (1.778 m)   Wt 192 lb 8 oz (87.3 kg)   SpO2 99%   BMI 27.62 kg/m  General:  Well developed, well nourished, in no apparent distress Skin:  Warm, no pallor or diaphoresis Lungs:  CTAB, no access msc use Cardio:  RRR, no bruits Psych: Age appropriate judgment and insight  Assessment:   Type 2 diabetes mellitus with diabetic polyneuropathy, with long-term current use of insulin (HCC) - Plan: Hemoglobin A1c   Plan:   Orders as above. Counseled on diet and exercise. F/u in 3-6 mo. The patient voiced understanding and agreement to the plan.  [place], DO 10/13/19 11:16 AM

## 2019-10-16 ENCOUNTER — Other Ambulatory Visit: Payer: Self-pay | Admitting: Family Medicine

## 2019-10-16 ENCOUNTER — Encounter: Payer: Self-pay | Admitting: Podiatry

## 2019-10-16 DIAGNOSIS — Z794 Long term (current) use of insulin: Secondary | ICD-10-CM

## 2019-10-16 LAB — HEMOGLOBIN A1C: Hgb A1c MFr Bld: 7.2 % — ABNORMAL HIGH (ref 4.6–6.5)

## 2019-10-16 MED FILL — PRAVASTATIN NA 20 MG TAB: 20 | 90 days supply | Qty: 90 | Fill #1

## 2019-10-16 MED FILL — FARXIGA 10 MG TABLET: 10 | 30 days supply | Qty: 30 | Fill #0

## 2019-10-16 MED FILL — PIOGLITAZONE HCL 30 MG TAB: 30 | 30 days supply | Qty: 30 | Fill #3

## 2019-10-16 MED FILL — JANUVIA 100 MG TABLET: 100 | 30 days supply | Qty: 30 | Fill #0

## 2019-10-17 ENCOUNTER — Other Ambulatory Visit: Payer: Self-pay

## 2019-10-17 ENCOUNTER — Ambulatory Visit: Payer: 59 | Admitting: Physical Therapy

## 2019-10-17 ENCOUNTER — Encounter: Payer: Self-pay | Admitting: Physical Therapy

## 2019-10-17 DIAGNOSIS — M25621 Stiffness of right elbow, not elsewhere classified: Secondary | ICD-10-CM | POA: Diagnosis not present

## 2019-10-17 DIAGNOSIS — M25611 Stiffness of right shoulder, not elsewhere classified: Secondary | ICD-10-CM

## 2019-10-17 DIAGNOSIS — R293 Abnormal posture: Secondary | ICD-10-CM | POA: Diagnosis not present

## 2019-10-17 DIAGNOSIS — M25511 Pain in right shoulder: Secondary | ICD-10-CM

## 2019-10-17 NOTE — Therapy (Signed)
[facility] [address]  [place], [place], [address] Phone: [phone]   Fax:  [phone]  Physical Therapy Treatment  Patient Details  Name: Reginald Rios MRN: [id number] Date of Birth: [birth year] Referring Provider (PT): Frankey Shown, MD   Encounter Date: 10/17/2019   PT End of Session - 10/17/19 1404    Visit Number 5    Number of Visits 13    Date for PT Re-Evaluation 10/30/19    Authorization Type [facility]    PT Start Time 1316    PT Stop Time 1401    PT Time Calculation (min) 45 min    Activity Tolerance Patient tolerated treatment well    Behavior During Therapy [facility] for tasks assessed/performed           Past Medical History:  Diagnosis Date  . ADD (attention deficit disorder)   . Anemia   . Arthritis   . CAD (coronary artery disease)    2/19 PCI/DESx1 to Lcx  . Diabetes mellitus without complication ([place])   . Exercise-induced asthma   . Heart murmur   . Hyperlipidemia    family hx of high cholesterol  . Hypogonadism in male   . Low back pain   . Osteoarthritis     Past Surgical History:  Procedure Laterality Date  . CORONARY STENT INTERVENTION N/A 06/23/2017   Procedure: CORONARY STENT INTERVENTION;  Surgeon: Leonie Man, MD;  Location: [place] [facility];  Service: Cardiovascular;  Laterality: N/A;  . INTRAVASCULAR PRESSURE WIRE/FFR STUDY N/A 06/23/2017   Procedure: INTRAVASCULAR PRESSURE WIRE/FFR STUDY;  Surgeon: Leonie Man, MD;  Location: [place] [facility];  Service: Cardiovascular;  Laterality: N/A;  . KNEE ARTHROSCOPY    . LEFT HEART CATH AND CORONARY ANGIOGRAPHY N/A 06/23/2017   Procedure: LEFT HEART CATH AND CORONARY ANGIOGRAPHY;  Surgeon: Leonie Man, MD;  Location: [place] [facility];  Service: Cardiovascular;  Laterality: N/A;  . SHOULDER ARTHROSCOPY WITH BICEPSTENOTOMY Right 09/06/2019   Procedure: SHOULDER ARTHROSCOPY WITH BICEPSTENOTOMY;  Surgeon: Leandrew Koyanagi,  MD;  Location: [place];  Service: Orthopedics;  Laterality: Right;  . SHOULDER ARTHROSCOPY WITH SUBACROMIAL DECOMPRESSION Right 09/06/2019   Procedure: RIGHT SHOULDER ARTHROSCOPY WITH EXTENSIVE DEBRIDEMENT, SUBACROMIAL DECOMPRESSION, BICEPS TENOTOMY;  Surgeon: Leandrew Koyanagi, MD;  Location: [place];  Service: Orthopedics;  Laterality: Right;  . TENOTOMY ACHILLES TENDON      There were no vitals filed for this visit.   Subjective Assessment - 10/17/19 1317    Subjective Used an overhead hedge clipper 2 days ago without any increase in pain. Was told that his surgeon cleared him for pickleball. Starting to have pain in his L foot- feels like there is a marble in it.    Pertinent History OA, LBP, HLD, heart murmur, asthma, DM, CAD, anemia, ADD, achilles tenotomy, L heart cath & angio 06/2017, coronary stent 06/2017, C4-6 fusion    Diagnostic tests none recent    Patient Stated Goals "Being able to lift something, comb my hair"    Currently in Pain? No/denies    Pain Score 3     Pain Location Shoulder    Pain Orientation Right;Anterior    Pain Descriptors / Indicators Tightness    Pain Type Acute pain                             [facility] Adult PT  Treatment/Exercise - 10/17/19 0001      Elbow Exercises   Elbow Flexion Strengthening;Right;10 reps;Seated    Bar Weights/Barbell (Elbow Flexion) 1 lb    Elbow Flexion Limitations biceps curl    Forearm Supination Strengthening;Right;10 reps;Seated    Bar Weights/Barbell (Forearm Supination) 1 lb;2 lbs    Forearm Supination Limitations 2nd set with 2#; cues to hold 3" at end range for max benefit      Shoulder Exercises: Seated   Flexion AAROM;Right;5 reps    Flexion Limitations with wand; to tolerance    Abduction AAROM;Right;5 reps    ABduction Limitations with wand; to tolerance   cues to avoid shoulder hike     Shoulder Exercises: Sidelying   External Rotation Strengthening;Right;10  reps;Weights    External Rotation Weight (lbs) 1    External Rotation Limitations to comfortable end-range    ABduction Strengthening;Right;10 reps    ABduction Weight (lbs) 1    ABduction Limitations thumb up; to tolerance   cues to increase ROM and control eccentric phase     Shoulder Exercises: Standing   Extension Strengthening;Both;10 reps;Theraband    Theraband Level (Shoulder Extension) Level 2 (Red)    Extension Limitations cues to maintain straight elbows    Row Strengthening;Both;10 reps;Theraband    Theraband Level (Shoulder Row) Level 2 (Red)    Row Weight (lbs) cues for upright posture, scap retraction, shoulder depression      Shoulder Exercises: Pulleys   Scaption 3 minutes    Scaption Limitations to tolerance      Shoulder Exercises: ROM/Strengthening   UBE (Upper Arm Bike) L1.0 x 3 min forward      Shoulder Exercises: Stretch   Other Shoulder Stretches R shoulder ER stretch in doorway to tolerance 3x20"                  PT Education - 10/17/19 1403    Education Details review and update to HEP for max benefit; counseled patient on avoiding long periods of playing racket sports and instead to start with 1 game and reflect on resulting effects    Person(s) Educated Patient    Methods Explanation;Demonstration;Tactile cues;Verbal cues;Handout    Comprehension Verbalized understanding;Returned demonstration            PT Short Term Goals - 10/09/19 1509      PT SHORT TERM GOAL #1   Title Patient to be independent with initial HEP.    Time 3    Period Weeks    Status Achieved    Target Date 10/09/19             PT Long Term Goals - 10/09/19 1558      PT LONG TERM GOAL #1   Title Patient to be independent with advanced HEP.    Time 6    Period Weeks    Status On-going      PT LONG TERM GOAL #2   Title Patient to demonstrate [facility] and pain-free R shoulder AROM/PROM.    Time 6    Period Weeks    Status On-going      PT LONG TERM GOAL #3     Title Patient to demonstrate >=4+/5 strength in R UE and demonstrate symmetrical grip strength in B hands.    Time 6    Period Weeks    Status On-going      PT LONG TERM GOAL #4   Title Patient to report 70% improvement in pain levels with overhead reaching.  Time 6    Period Weeks    Status On-going   10/09/19: notes 15% improvement     PT LONG TERM GOAL #5   Title Patient to return to pain-free pickleball with modifications as needed.    Time 6    Period Weeks    Status On-going                 Plan - 10/17/19 1404    Clinical Impression Statement Patient reporting that his surgeon cleared him for pickleball, but has not yet played. Initiated periscapular and RTC strengthening with patient requiring intermittent cues for upright posture, scapular retraction, and shoulder depression. Patient remarking on remaining limitation in ER, thus worked on stretching into this motion at doorway to patient's tolerance. Reviewed HEP and increased challenge with the exercises that were performed today- patient reported understanding. Patient able to tolerate sidelying shoulder strengthening with addition of light weighted resistance. Able to perform with good muscle control after cueing. Shoulder AAROM exercises were performed in sitting for increased challenge, as patient still demonstrating some limitation at end ranges. Counseled patient on avoiding long periods of playing racket sports and instead to start with 1 game and reflect on resultant soreness if it occurs. Patient reported understanding and without c/o pain at end of session.    Comorbidities OA, LBP, HLD, heart murmur, asthma, DM, CAD, anemia, ADD, achilles tenotomy, L heart cath & angio 06/2017, coronary stent 06/2017, C4-6 fusion    Rehab Potential Good    PT Frequency 2x / week    PT Treatment/Interventions ADLs/Self Care Home Management;Cryotherapy;Electrical Stimulation;Moist Heat;Therapeutic exercise;Therapeutic  activities;Functional mobility training;Ultrasound;Neuromuscular re-education;Patient/family education;Manual techniques;Vasopneumatic Device;Taping;Energy conservation;Dry needling;Passive range of motion    PT Next Visit Plan gentle elbow and shoulder strengthening, shoulder AAROM and stretching to address remaining limitations    Consulted and Agree with Plan of Care Patient           Patient will benefit from skilled therapeutic intervention in order to improve the following deficits and impairments:  Hypomobility, Impaired sensation, Decreased activity tolerance, Decreased strength, Increased fascial restricitons, Impaired UE functional use, Pain, Improper body mechanics, Decreased range of motion, Postural dysfunction, Impaired flexibility, Decreased scar mobility  Visit Diagnosis: Acute pain of right shoulder  Stiffness of right shoulder, not elsewhere classified  Stiffness of right elbow, not elsewhere classified  Abnormal posture     Problem List Patient Active Problem List   Diagnosis Date Noted  . Myofascial pain syndrome, cervical 07/28/2019  . Lateral epicondylitis of right elbow 07/28/2019  . Nontraumatic incomplete tear of right rotator cuff 07/25/2019  . Tendinopathy of right biceps tendon 07/25/2019  . Erectile dysfunction 07/11/2019  . Mild persistent asthma/cough variant asthma 04/13/2019  . Allergic rhinitis with a possible nonallergic component 04/13/2019  . Acid reflux 04/13/2019  . Cough 04/13/2019  . Hoarseness 04/13/2019  . Cervical radicular pain 01/10/2019  . Ulnar neuropathy at elbow of right upper extremity 01/03/2019  . Acute bilateral low back pain without sciatica 01/03/2019  . Onychomycosis 05/25/2018  . Palpitations 07/06/2017  . CAD (coronary artery disease) 07/05/2017  . Family history of early CAD 07/05/2017  . Abnormal findings on diagnostic imaging of cardiovascular system 06/23/2017  . Low vitamin D level 04/22/2017  . Pain in right  ankle and joints of right foot 10/05/2016  . Hemarthrosis of right elbow 02/03/2016  . Small thenar eminence 02/03/2016  . Cervical disc disorder with radiculopathy of cervical region 10/07/2015  . Incomplete rotator cuff tear  09/10/2015  . Nonischemic cardiomyopathy ([place]) 05/15/2015  . Fatigue 05/15/2015  . Dyspnea on exertion 05/15/2015  . Decreased cardiac ejection fraction 04/02/2015  . Strain of latissimus dorsi muscle 01/21/2015  . Shoulder bursitis 10/12/2014  . Stenosis of lateral recess of lumbar spine 10/12/2014  . BPPV (benign paroxysmal positional vertigo) 08/14/2014  . Hyperlipidemia 01/19/2014  . Anemia, unspecified 01/19/2014  . ADD (attention deficit disorder) 01/19/2014  . Type 2 diabetes mellitus with hyperglycemia, without long-term current use of insulin ([place]) 12/22/2013  . Hypogonadism in male 12/22/2013  . Arthritis of hand, left 12/22/2013     Janene [place], PT, DPT 10/17/19 2:09 PM   [facility] [address]  [place], [place], [address] Phone: [phone]   Fax:  [phone]  Name: Reginald Rios MRN: [id number] Date of Birth: 03-Nov-1955

## 2019-10-19 ENCOUNTER — Ambulatory Visit: Payer: 59 | Admitting: Podiatry

## 2019-10-19 ENCOUNTER — Other Ambulatory Visit: Payer: Self-pay

## 2019-10-19 DIAGNOSIS — M7752 Other enthesopathy of left foot: Secondary | ICD-10-CM | POA: Diagnosis not present

## 2019-10-19 NOTE — Progress Notes (Signed)
  Subjective:  Patient ID: Reginald Rios, male    DOB: 07-21-1955,  MRN: [id number]  Chief Complaint  Patient presents with  . Foot Pain    Pt states pain in left plantar forefoot is still present and is worse. Pt states previous injection was initially helpful but did not last.  . Tinea Pedis    Pt states cream helped with skin but did not help nail.    64 y.o. male presents with the above complaint. History confirmed with patient.   Objective:  Physical Exam: warm, good capillary refill, no trophic changes or ulcerative lesions, normal DP and PT pulses and normal sensory exam. Left Foot: tenderness of the 2nd metatarsal head xerosis with scaling plantar foot. Left hallux nail with thickening white powder dystrophy Right Foot: normal exam, no swelling, tenderness, instability; ligaments intact, full range of motion of all ankle/foot joints hammmertoe 2nd toe  Assessment:   1. Capsulitis of metatarsophalangeal (MTP) joint of left foot    Plan:  Patient was evaluated and treated and all questions answered.  Capsulitis -Repeat injection as below -Padding applied  Procedure: Joint Injection Location: Left 2nd MP joint Skin Prep: Alcohol. Injectate: 0.25 cc 1% lidocaine plain, 0.25 cc dexamethasone phosphate, 0.25 cc kenalog 10. Disposition: Patient tolerated procedure well. Injection site dressed with a band-aid.   Onychomycosis, Tinea Pedis -Continue  No follow-ups on file.

## 2019-10-20 ENCOUNTER — Ambulatory Visit: Payer: 59 | Admitting: Physical Therapy

## 2019-10-20 ENCOUNTER — Encounter: Payer: Self-pay | Admitting: Physical Therapy

## 2019-10-20 DIAGNOSIS — R293 Abnormal posture: Secondary | ICD-10-CM

## 2019-10-20 DIAGNOSIS — M25621 Stiffness of right elbow, not elsewhere classified: Secondary | ICD-10-CM

## 2019-10-20 DIAGNOSIS — M25611 Stiffness of right shoulder, not elsewhere classified: Secondary | ICD-10-CM | POA: Diagnosis not present

## 2019-10-20 DIAGNOSIS — M25511 Pain in right shoulder: Secondary | ICD-10-CM | POA: Diagnosis not present

## 2019-10-20 NOTE — Therapy (Signed)
[facility] [address]  [place], [place], [address] Phone: [phone]   Fax:  [phone]  Physical Therapy Treatment  Patient Details  Name: Reginald Rios MRN: [id number] Date of Birth: [birth year] Referring Provider (PT): Frankey Shown, MD   Encounter Date: 10/20/2019   PT End of Session - 10/20/19 1148    Visit Number 6    Number of Visits 13    Date for PT Re-Evaluation 10/30/19    Authorization Type [facility]    PT Start Time 1103    PT Stop Time 1159    PT Time Calculation (min) 56 min    Activity Tolerance Patient tolerated treatment well;Patient limited by pain    Behavior During Therapy [facility] for tasks assessed/performed           Past Medical History:  Diagnosis Date  . ADD (attention deficit disorder)   . Anemia   . Arthritis   . CAD (coronary artery disease)    2/19 PCI/DESx1 to Lcx  . Diabetes mellitus without complication ([place])   . Exercise-induced asthma   . Heart murmur   . Hyperlipidemia    family hx of high cholesterol  . Hypogonadism in male   . Low back pain   . Osteoarthritis     Past Surgical History:  Procedure Laterality Date  . CORONARY STENT INTERVENTION N/A 06/23/2017   Procedure: CORONARY STENT INTERVENTION;  Surgeon: Leonie Man, MD;  Location: [place] [facility];  Service: Cardiovascular;  Laterality: N/A;  . INTRAVASCULAR PRESSURE WIRE/FFR STUDY N/A 06/23/2017   Procedure: INTRAVASCULAR PRESSURE WIRE/FFR STUDY;  Surgeon: Leonie Man, MD;  Location: [place] [facility];  Service: Cardiovascular;  Laterality: N/A;  . KNEE ARTHROSCOPY    . LEFT HEART CATH AND CORONARY ANGIOGRAPHY N/A 06/23/2017   Procedure: LEFT HEART CATH AND CORONARY ANGIOGRAPHY;  Surgeon: Leonie Man, MD;  Location: [place] [facility];  Service: Cardiovascular;  Laterality: N/A;  . SHOULDER ARTHROSCOPY WITH BICEPSTENOTOMY Right 09/06/2019   Procedure: SHOULDER ARTHROSCOPY WITH BICEPSTENOTOMY;   Surgeon: Leandrew Koyanagi, MD;  Location: [place];  Service: Orthopedics;  Laterality: Right;  . SHOULDER ARTHROSCOPY WITH SUBACROMIAL DECOMPRESSION Right 09/06/2019   Procedure: RIGHT SHOULDER ARTHROSCOPY WITH EXTENSIVE DEBRIDEMENT, SUBACROMIAL DECOMPRESSION, BICEPS TENOTOMY;  Surgeon: Leandrew Koyanagi, MD;  Location: [place];  Service: Orthopedics;  Laterality: Right;  . TENOTOMY ACHILLES TENDON      There were no vitals filed for this visit.   Subjective Assessment - 10/20/19 1104    Subjective Having some muscle fatigue with strengthening exercises. Feels like his shoulder stability is improving when swimming. Able to play pickleball on Wednesday without pain. Noticing some stiffness in his neck.    Pertinent History OA, LBP, HLD, heart murmur, asthma, DM, CAD, anemia, ADD, achilles tenotomy, L heart cath & angio 06/2017, coronary stent 06/2017, C4-6 fusion    Diagnostic tests none recent    Patient Stated Goals "Being able to lift something, comb my hair"    Currently in Pain? Yes    Pain Score 6     Pain Location Neck    Pain Orientation Right    Pain Descriptors / Indicators Tightness    Pain Type Acute pain                             [facility] Adult PT Treatment/Exercise - 10/20/19 0001  Shoulder Exercises: Standing   External Rotation Strengthening;10 reps;Theraband;Both    Theraband Level (Shoulder External Rotation) Level 1 (Yellow)    External Rotation Limitations 2x10; good control    Internal Rotation Strengthening;Right;10 reps;Theraband    Theraband Level (Shoulder Internal Rotation) Level 1 (Yellow)    Internal Rotation Limitations 2x10; good control      Shoulder Exercises: ROM/Strengthening   UBE (Upper Arm Bike) L1.0 x 3 min forward/3 min back      Moist Heat Therapy   Number Minutes Moist Heat 15 Minutes    Moist Heat Location Cervical      Electrical Stimulation   Electrical Stimulation Location R UT     Electrical Stimulation Action IFC    Electrical Stimulation Parameters 80-150hz ; output to tolerance; 15 min    Electrical Stimulation Goals Tone;Pain      Manual Therapy   Manual Therapy Soft tissue mobilization;Myofascial release;Passive ROM    Manual therapy comments L sidelying    Soft tissue mobilization STM to R UT and LS- palpable tender muscle knot evident    Myofascial Release manual TPR to R UT- good relief of pain    Passive ROM R shoulder PROM in all directions- good ROM and pain-free      Prosthetics   Prosthetic Care Comments         Neck Exercises: Stretches   Upper Trapezius Stretch Right;30 seconds;1 rep    Upper Trapezius Stretch Limitations with strap assistance    Levator Stretch Right;30 seconds;1 rep    Levator Stretch Limitations with strap assistance                  PT Education - 10/20/19 1148    Education Details update to HEP    Person(s) Educated Patient    Methods Explanation;Demonstration;Tactile cues;Verbal cues;Handout    Comprehension Verbalized understanding;Returned demonstration            PT Short Term Goals - 10/09/19 1509      PT SHORT TERM GOAL #1   Title Patient to be independent with initial HEP.    Time 3    Period Weeks    Status Achieved    Target Date 10/09/19             PT Long Term Goals - 10/09/19 1558      PT LONG TERM GOAL #1   Title Patient to be independent with advanced HEP.    Time 6    Period Weeks    Status On-going      PT LONG TERM GOAL #2   Title Patient to demonstrate [facility] and pain-free R shoulder AROM/PROM.    Time 6    Period Weeks    Status On-going      PT LONG TERM GOAL #3   Title Patient to demonstrate >=4+/5 strength in R UE and demonstrate symmetrical grip strength in B hands.    Time 6    Period Weeks    Status On-going      PT LONG TERM GOAL #4   Title Patient to report 70% improvement in pain levels with overhead reaching.    Time 6    Period Weeks    Status On-going    10/09/19: notes 15% improvement     PT LONG TERM GOAL #5   Title Patient to return to pain-free pickleball with modifications as needed.    Time 6    Period Weeks    Status On-going  Plan - 10/20/19 1149    Clinical Impression Statement Patient reported ability to play pickleball on Wednesday without pain. Notes that he feels like his shoulder stability is improving when swimming. Patient noting some stiffness in his neck today, thus worked on gentle cervical stretching and manual therapy for relief. Patient demonstrated considerable soft tissue restriction and painful trigger points over R LS and UT and reported good relief after manual therapy. Initiated shoulder rotation strengthening with patient demonstrating good muscle control. Patient did intermittently require cues to lift the head and stand upright. Ended session with moist heat and e-stim to R UT for pain relief. No complaints at end of session.    Comorbidities OA, LBP, HLD, heart murmur, asthma, DM, CAD, anemia, ADD, achilles tenotomy, L heart cath & angio 06/2017, coronary stent 06/2017, C4-6 fusion    Rehab Potential Good    PT Frequency 2x / week    PT Treatment/Interventions ADLs/Self Care Home Management;Cryotherapy;Electrical Stimulation;Moist Heat;Therapeutic exercise;Therapeutic activities;Functional mobility training;Ultrasound;Neuromuscular re-education;Patient/family education;Manual techniques;Vasopneumatic Device;Taping;Energy conservation;Dry needling;Passive range of motion    PT Next Visit Plan gentle elbow and shoulder strengthening, shoulder AAROM and stretching to address remaining limitations    Consulted and Agree with Plan of Care Patient           Patient will benefit from skilled therapeutic intervention in order to improve the following deficits and impairments:  Hypomobility, Impaired sensation, Decreased activity tolerance, Decreased strength, Increased fascial restricitons,  Impaired UE functional use, Pain, Improper body mechanics, Decreased range of motion, Postural dysfunction, Impaired flexibility, Decreased scar mobility  Visit Diagnosis: Acute pain of right shoulder  Stiffness of right shoulder, not elsewhere classified  Stiffness of right elbow, not elsewhere classified  Abnormal posture     Problem List Patient Active Problem List   Diagnosis Date Noted  . Myofascial pain syndrome, cervical 07/28/2019  . Lateral epicondylitis of right elbow 07/28/2019  . Nontraumatic incomplete tear of right rotator cuff 07/25/2019  . Tendinopathy of right biceps tendon 07/25/2019  . Erectile dysfunction 07/11/2019  . Mild persistent asthma/cough variant asthma 04/13/2019  . Allergic rhinitis with a possible nonallergic component 04/13/2019  . Acid reflux 04/13/2019  . Cough 04/13/2019  . Hoarseness 04/13/2019  . Cervical radicular pain 01/10/2019  . Ulnar neuropathy at elbow of right upper extremity 01/03/2019  . Acute bilateral low back pain without sciatica 01/03/2019  . Onychomycosis 05/25/2018  . Palpitations 07/06/2017  . CAD (coronary artery disease) 07/05/2017  . Family history of early CAD 07/05/2017  . Abnormal findings on diagnostic imaging of cardiovascular system 06/23/2017  . Low vitamin D level 04/22/2017  . Pain in right ankle and joints of right foot 10/05/2016  . Hemarthrosis of right elbow 02/03/2016  . Small thenar eminence 02/03/2016  . Cervical disc disorder with radiculopathy of cervical region 10/07/2015  . Incomplete rotator cuff tear 09/10/2015  . Nonischemic cardiomyopathy ([place]) 05/15/2015  . Fatigue 05/15/2015  . Dyspnea on exertion 05/15/2015  . Decreased cardiac ejection fraction 04/02/2015  . Strain of latissimus dorsi muscle 01/21/2015  . Shoulder bursitis 10/12/2014  . Stenosis of lateral recess of lumbar spine 10/12/2014  . BPPV (benign paroxysmal positional vertigo) 08/14/2014  . Hyperlipidemia 01/19/2014  .  Anemia, unspecified 01/19/2014  . ADD (attention deficit disorder) 01/19/2014  . Type 2 diabetes mellitus with hyperglycemia, without long-term current use of insulin ([place]) 12/22/2013  . Hypogonadism in male 12/22/2013  . Arthritis of hand, left 12/22/2013    Janene Harvey, PT, DPT 10/20/19 12:07 PM  [facility] [address]  [place], [place], [address] Phone: [phone]   Fax:  [phone]  Name: Reginald Rios MRN: [id number] Date of Birth: 08-29-1955

## 2019-10-23 ENCOUNTER — Ambulatory Visit: Payer: 59 | Admitting: Physical Therapy

## 2019-10-23 ENCOUNTER — Other Ambulatory Visit: Payer: Self-pay

## 2019-10-23 ENCOUNTER — Encounter: Payer: Self-pay | Admitting: Physical Therapy

## 2019-10-23 DIAGNOSIS — M25611 Stiffness of right shoulder, not elsewhere classified: Secondary | ICD-10-CM

## 2019-10-23 DIAGNOSIS — M25511 Pain in right shoulder: Secondary | ICD-10-CM | POA: Diagnosis not present

## 2019-10-23 DIAGNOSIS — M25621 Stiffness of right elbow, not elsewhere classified: Secondary | ICD-10-CM | POA: Diagnosis not present

## 2019-10-23 DIAGNOSIS — R293 Abnormal posture: Secondary | ICD-10-CM

## 2019-10-23 NOTE — Therapy (Signed)
[facility] [address]  [place], [place], [address] Phone: [phone]   Fax:  [phone]  Physical Therapy Treatment  Patient Details  Name: Reginald Rios MRN: [id number] Date of Birth: [birth year] Referring Provider (PT): Frankey Shown, MD   Encounter Date: 10/23/2019   PT End of Session - 10/23/19 1359    Visit Number 7    Number of Visits 13    Date for PT Re-Evaluation 10/30/19    Authorization Type [facility]    PT Start Time 1310    PT Stop Time 1411    PT Time Calculation (min) 61 min    Activity Tolerance Patient tolerated treatment well    Behavior During Therapy [facility] for tasks assessed/performed           Past Medical History:  Diagnosis Date  . ADD (attention deficit disorder)   . Anemia   . Arthritis   . CAD (coronary artery disease)    2/19 PCI/DESx1 to Lcx  . Diabetes mellitus without complication ([place])   . Exercise-induced asthma   . Heart murmur   . Hyperlipidemia    family hx of high cholesterol  . Hypogonadism in male   . Low back pain   . Osteoarthritis     Past Surgical History:  Procedure Laterality Date  . CORONARY STENT INTERVENTION N/A 06/23/2017   Procedure: CORONARY STENT INTERVENTION;  Surgeon: Leonie Man, MD;  Location: [place] [facility];  Service: Cardiovascular;  Laterality: N/A;  . INTRAVASCULAR PRESSURE WIRE/FFR STUDY N/A 06/23/2017   Procedure: INTRAVASCULAR PRESSURE WIRE/FFR STUDY;  Surgeon: Leonie Man, MD;  Location: [place] [facility];  Service: Cardiovascular;  Laterality: N/A;  . KNEE ARTHROSCOPY    . LEFT HEART CATH AND CORONARY ANGIOGRAPHY N/A 06/23/2017   Procedure: LEFT HEART CATH AND CORONARY ANGIOGRAPHY;  Surgeon: Leonie Man, MD;  Location: [place] [facility];  Service: Cardiovascular;  Laterality: N/A;  . SHOULDER ARTHROSCOPY WITH BICEPSTENOTOMY Right 09/06/2019   Procedure: SHOULDER ARTHROSCOPY WITH BICEPSTENOTOMY;  Surgeon: Leandrew Koyanagi,  MD;  Location: [place];  Service: Orthopedics;  Laterality: Right;  . SHOULDER ARTHROSCOPY WITH SUBACROMIAL DECOMPRESSION Right 09/06/2019   Procedure: RIGHT SHOULDER ARTHROSCOPY WITH EXTENSIVE DEBRIDEMENT, SUBACROMIAL DECOMPRESSION, BICEPS TENOTOMY;  Surgeon: Leandrew Koyanagi, MD;  Location: [place];  Service: Orthopedics;  Laterality: Right;  . TENOTOMY ACHILLES TENDON      There were no vitals filed for this visit.   Subjective Assessment - 10/23/19 1311    Subjective Patient without new complaints today. Shoulder has been feeling good, just tight. Did still notice some shoulder weakness when using tennis racket.    Pertinent History OA, LBP, HLD, heart murmur, asthma, DM, CAD, anemia, ADD, achilles tenotomy, L heart cath & angio 06/2017, coronary stent 06/2017, C4-6 fusion    Diagnostic tests none recent    Patient Stated Goals "Being able to lift something, comb my hair"    Currently in Pain? Yes    Pain Score 2     Pain Location Neck    Pain Orientation Right    Pain Descriptors / Indicators Tightness                             [facility] Adult PT Treatment/Exercise - 10/23/19 0001      Elbow Exercises   Elbow Flexion Strengthening;Right;10 reps;Seated    Bar Weights/Barbell (Elbow  Flexion) 2 lbs    Elbow Flexion Limitations biceps curl    Forearm Supination Strengthening;Right;10 reps;Seated    Bar Weights/Barbell (Forearm Supination) 3 lbs    Forearm Supination Limitations 2nd set with mid-grip on hammer; cues to perform with unsupported forearm for increased difficulty      Shoulder Exercises: Seated   Flexion Strengthening;AROM;Right;10 reps    Flexion Limitations thumb up; ROM to tolerance    Abduction Strengthening;AROM;Right;15 reps    ABduction Limitations thumb up; ROM to tolerance   ROM to ~110 deg, ~120 deg after MT     Shoulder Exercises: Standing   External Rotation Strengthening;10 reps;Theraband;Both    Theraband  Level (Shoulder External Rotation) Level 1 (Yellow);Level 2 (Red)    External Rotation Limitations 2x10; good control    Internal Rotation Strengthening;Right;10 reps;Theraband    Theraband Level (Shoulder Internal Rotation) Level 1 (Yellow);Level 2 (Red)    Internal Rotation Limitations 2x10; good control   cues to stop at neutral     Shoulder Exercises: ROM/Strengthening   UBE (Upper Arm Bike) L1.0 x 3 min forward/3 min back      Moist Heat Therapy   Number Minutes Moist Heat 15 Minutes    Moist Heat Location Cervical      Electrical Stimulation   Electrical Stimulation Location R UT    Electrical Stimulation Action IFC    Electrical Stimulation Parameters 80-150hz ; output to tolerance; 15 min    Electrical Stimulation Goals Tone;Pain      Manual Therapy   Manual Therapy Passive ROM;Joint mobilization    Manual therapy comments supine    Joint Mobilization R shoulder inferior, posterior, anterior joint mobs grade III/IV, distraction grade III to tolerance     Passive ROM R shoulder PROM in all directions with prolonged holds at end ranges                    PT Short Term Goals - 10/09/19 1509      PT SHORT TERM GOAL #1   Title Patient to be independent with initial HEP.    Time 3    Period Weeks    Status Achieved    Target Date 10/09/19             PT Long Term Goals - 10/09/19 1558      PT LONG TERM GOAL #1   Title Patient to be independent with advanced HEP.    Time 6    Period Weeks    Status On-going      PT LONG TERM GOAL #2   Title Patient to demonstrate [facility] and pain-free R shoulder AROM/PROM.    Time 6    Period Weeks    Status On-going      PT LONG TERM GOAL #3   Title Patient to demonstrate >=4+/5 strength in R UE and demonstrate symmetrical grip strength in B hands.    Time 6    Period Weeks    Status On-going      PT LONG TERM GOAL #4   Title Patient to report 70% improvement in pain levels with overhead reaching.    Time 6     Period Weeks    Status On-going   10/09/19: notes 15% improvement     PT LONG TERM GOAL #5   Title Patient to return to pain-free pickleball with modifications as needed.    Time 6    Period Weeks    Status On-going  Plan - 10/23/19 1400    Clinical Impression Statement Patient without new complaints today, but does notice some continued weakness while using a tennis racket. Patient able to increase biceps and forearm strengthening without complaints. Worked on shoulder AROM to tolerance end ranges with patient demonstrating more limited ROM into abduction vs. flexion. Continued to demonstrate good form and control with resisted IR and ER and was able to tolerate an increase in resistance today. Patient remarking on continued difficulty reaching behind the back and behind the head, thus worked on manual therapy. Patient tolerated R shoulder joint mobs and PROM to end ranges without complaints. Able to raise R shoulder into abduction to ~120 degrees after MT. Ended session with moist heat and e-stim to R UT. No complaints at end of session. Patient progressing well towards goals.    Comorbidities OA, LBP, HLD, heart murmur, asthma, DM, CAD, anemia, ADD, achilles tenotomy, L heart cath & angio 06/2017, coronary stent 06/2017, C4-6 fusion    Rehab Potential Good    PT Frequency 2x / week    PT Treatment/Interventions ADLs/Self Care Home Management;Cryotherapy;Electrical Stimulation;Moist Heat;Therapeutic exercise;Therapeutic activities;Functional mobility training;Ultrasound;Neuromuscular re-education;Patient/family education;Manual techniques;Vasopneumatic Device;Taping;Energy conservation;Dry needling;Passive range of motion    PT Next Visit Plan gentle elbow and shoulder strengthening, shoulder AAROM and stretching to address remaining limitations    Consulted and Agree with Plan of Care Patient           Patient will benefit from skilled therapeutic intervention in  order to improve the following deficits and impairments:  Hypomobility, Impaired sensation, Decreased activity tolerance, Decreased strength, Increased fascial restricitons, Impaired UE functional use, Pain, Improper body mechanics, Decreased range of motion, Postural dysfunction, Impaired flexibility, Decreased scar mobility  Visit Diagnosis: Acute pain of right shoulder  Stiffness of right shoulder, not elsewhere classified  Stiffness of right elbow, not elsewhere classified  Abnormal posture     Problem List Patient Active Problem List   Diagnosis Date Noted  . Myofascial pain syndrome, cervical 07/28/2019  . Lateral epicondylitis of right elbow 07/28/2019  . Nontraumatic incomplete tear of right rotator cuff 07/25/2019  . Tendinopathy of right biceps tendon 07/25/2019  . Erectile dysfunction 07/11/2019  . Mild persistent asthma/cough variant asthma 04/13/2019  . Allergic rhinitis with a possible nonallergic component 04/13/2019  . Acid reflux 04/13/2019  . Cough 04/13/2019  . Hoarseness 04/13/2019  . Cervical radicular pain 01/10/2019  . Ulnar neuropathy at elbow of right upper extremity 01/03/2019  . Acute bilateral low back pain without sciatica 01/03/2019  . Onychomycosis 05/25/2018  . Palpitations 07/06/2017  . CAD (coronary artery disease) 07/05/2017  . Family history of early CAD 07/05/2017  . Abnormal findings on diagnostic imaging of cardiovascular system 06/23/2017  . Low vitamin D level 04/22/2017  . Pain in right ankle and joints of right foot 10/05/2016  . Hemarthrosis of right elbow 02/03/2016  . Small thenar eminence 02/03/2016  . Cervical disc disorder with radiculopathy of cervical region 10/07/2015  . Incomplete rotator cuff tear 09/10/2015  . Nonischemic cardiomyopathy ([place]) 05/15/2015  . Fatigue 05/15/2015  . Dyspnea on exertion 05/15/2015  . Decreased cardiac ejection fraction 04/02/2015  . Strain of latissimus dorsi muscle 01/21/2015  . Shoulder  bursitis 10/12/2014  . Stenosis of lateral recess of lumbar spine 10/12/2014  . BPPV (benign paroxysmal positional vertigo) 08/14/2014  . Hyperlipidemia 01/19/2014  . Anemia, unspecified 01/19/2014  . ADD (attention deficit disorder) 01/19/2014  . Type 2 diabetes mellitus with hyperglycemia, without long-term current use of  insulin ([place]) 12/22/2013  . Hypogonadism in male 12/22/2013  . Arthritis of hand, left 12/22/2013     Janene Harvey, PT, DPT 10/23/19 4:30 PM   [facility] [address]  [place], [place], [address] Phone: [phone]   Fax:  [phone]  Name: Reginald Rios MRN: [id number] Date of Birth: 04-13-1956

## 2019-10-25 ENCOUNTER — Other Ambulatory Visit: Payer: Self-pay | Admitting: Family Medicine

## 2019-10-25 DIAGNOSIS — E291 Testicular hypofunction: Secondary | ICD-10-CM

## 2019-10-25 MED FILL — TESTOSTERONE 50 MG/5GM (1%): 50 MG/5GM | 30 days supply | Qty: 300 | Fill #0

## 2019-10-27 ENCOUNTER — Other Ambulatory Visit: Payer: Self-pay

## 2019-10-27 ENCOUNTER — Encounter: Payer: Self-pay | Admitting: Physical Therapy

## 2019-10-27 ENCOUNTER — Ambulatory Visit: Payer: 59 | Admitting: Physical Therapy

## 2019-10-27 DIAGNOSIS — R293 Abnormal posture: Secondary | ICD-10-CM

## 2019-10-27 DIAGNOSIS — M25611 Stiffness of right shoulder, not elsewhere classified: Secondary | ICD-10-CM | POA: Diagnosis not present

## 2019-10-27 DIAGNOSIS — M25511 Pain in right shoulder: Secondary | ICD-10-CM | POA: Diagnosis not present

## 2019-10-27 DIAGNOSIS — M25621 Stiffness of right elbow, not elsewhere classified: Secondary | ICD-10-CM | POA: Diagnosis not present

## 2019-10-27 NOTE — Therapy (Signed)
[facility] [address]  [place], [place], [address] Phone: [phone]   Fax:  [phone]  Physical Therapy Treatment  Patient Details  Name: Reginald Rios MRN: [id number] Date of Birth: 03-17-1956 Referring Provider (PT): Frankey Shown, MD   Encounter Date: 10/27/2019   PT End of Session - 10/27/19 1207    Visit Number 8    Number of Visits 13    Date for PT Re-Evaluation 10/30/19    Authorization Type [facility]    PT Start Time 1016    PT Stop Time 1100    PT Time Calculation (min) 44 min    Activity Tolerance Patient tolerated treatment well    Behavior During Therapy [facility] for tasks assessed/performed           Past Medical History:  Diagnosis Date  . ADD (attention deficit disorder)   . Anemia   . Arthritis   . CAD (coronary artery disease)    2/19 PCI/DESx1 to Lcx  . Diabetes mellitus without complication ([place])   . Exercise-induced asthma   . Heart murmur   . Hyperlipidemia    family hx of high cholesterol  . Hypogonadism in male   . Low back pain   . Osteoarthritis     Past Surgical History:  Procedure Laterality Date  . CORONARY STENT INTERVENTION N/A 06/23/2017   Procedure: CORONARY STENT INTERVENTION;  Surgeon: Leonie Man, MD;  Location: [place] [facility];  Service: Cardiovascular;  Laterality: N/A;  . INTRAVASCULAR PRESSURE WIRE/FFR STUDY N/A 06/23/2017   Procedure: INTRAVASCULAR PRESSURE WIRE/FFR STUDY;  Surgeon: Leonie Man, MD;  Location: [place] [facility];  Service: Cardiovascular;  Laterality: N/A;  . KNEE ARTHROSCOPY    . LEFT HEART CATH AND CORONARY ANGIOGRAPHY N/A 06/23/2017   Procedure: LEFT HEART CATH AND CORONARY ANGIOGRAPHY;  Surgeon: Leonie Man, MD;  Location: [place] [facility];  Service: Cardiovascular;  Laterality: N/A;  . SHOULDER ARTHROSCOPY WITH BICEPSTENOTOMY Right 09/06/2019   Procedure: SHOULDER ARTHROSCOPY WITH BICEPSTENOTOMY;  Surgeon: Leandrew Koyanagi,  MD;  Location: [place];  Service: Orthopedics;  Laterality: Right;  . SHOULDER ARTHROSCOPY WITH SUBACROMIAL DECOMPRESSION Right 09/06/2019   Procedure: RIGHT SHOULDER ARTHROSCOPY WITH EXTENSIVE DEBRIDEMENT, SUBACROMIAL DECOMPRESSION, BICEPS TENOTOMY;  Surgeon: Leandrew Koyanagi, MD;  Location: [place];  Service: Orthopedics;  Laterality: Right;  . TENOTOMY ACHILLES TENDON      There were no vitals filed for this visit.   Subjective Assessment - 10/27/19 1017    Subjective Has been playing pickleball for 2 hours for the past couple of days and the shoulder has been feeling good. WOuld like to focus on stretching today.    Pertinent History OA, LBP, HLD, heart murmur, asthma, DM, CAD, anemia, ADD, achilles tenotomy, L heart cath & angio 06/2017, coronary stent 06/2017, C4-6 fusion    Diagnostic tests none recent    Patient Stated Goals "Being able to lift something, comb my hair"    Currently in Pain? No/denies                             [facility] Adult PT Treatment/Exercise - 10/27/19 0001      Shoulder Exercises: Supine   External Rotation Strengthening;Right;10 reps;Weights    External Rotation Weight (lbs) 1    External Rotation Limitations 90 deg abduction; 3" hold at end range    Flexion Strengthening;Right;10  reps    Shoulder Flexion Weight (lbs) 2    Flexion Limitations thumb up; 3" hold at end range for stretch      Shoulder Exercises: Seated   Other Seated Exercises B lat stretch in prayer position with red pball 5x10" forward, to L 5x10"      Shoulder Exercises: Sidelying   External Rotation Strengthening;Right;10 reps;Weights    External Rotation Weight (lbs) 1    External Rotation Limitations thumb up' 3" hold at end range for max stretch      Shoulder Exercises: ROM/Strengthening   UBE (Upper Arm Bike) L1.5 x 3 min forward/3 min back      Manual Therapy   Manual Therapy Passive ROM;Joint mobilization    Manual therapy  comments supine    Joint Mobilization R shoulder inferior, posterior, anterior joint mobs grade III/IV, distraction grade III to tolerance     Myofascial Release R shoulder IR isometric 2x5" followed by prolonged ER PROM    Passive ROM R shoulder PROM in flexion, abduction, ER with prolonged holds at end ranges with stabilization of scapula                  PT Education - 10/27/19 1207    Education Details edu on several yoga poses for optimal lat stretch    Person(s) Educated Patient    Methods Explanation;Demonstration    Comprehension Verbalized understanding            PT Short Term Goals - 10/09/19 1509      PT SHORT TERM GOAL #1   Title Patient to be independent with initial HEP.    Time 3    Period Weeks    Status Achieved    Target Date 10/09/19             PT Long Term Goals - 10/09/19 1558      PT LONG TERM GOAL #1   Title Patient to be independent with advanced HEP.    Time 6    Period Weeks    Status On-going      PT LONG TERM GOAL #2   Title Patient to demonstrate [facility] and pain-free R shoulder AROM/PROM.    Time 6    Period Weeks    Status On-going      PT LONG TERM GOAL #3   Title Patient to demonstrate >=4+/5 strength in R UE and demonstrate symmetrical grip strength in B hands.    Time 6    Period Weeks    Status On-going      PT LONG TERM GOAL #4   Title Patient to report 70% improvement in pain levels with overhead reaching.    Time 6    Period Weeks    Status On-going   10/09/19: notes 15% improvement     PT LONG TERM GOAL #5   Title Patient to return to pain-free pickleball with modifications as needed.    Time 6    Period Weeks    Status On-going                 Plan - 10/27/19 1208    Clinical Impression Statement Patient reporting that he has been playing pickleball for 2 hours for the past couple of days without shoulder pain. Requesting to focus on stretching today. Spent majority of session on manual therapy to  address PROM and joint mobility in flexion, abduction, and ER motions as these were most limited. Patient tolerated MT well and reported  no pain throughout. Able to demonstrate improved ROM in all planes of motion that were addressed today. Proceeded with shoulder stretching with assistance of weighted resistance to achieve max stretch. Patient reported feeling better at end of session. Patient is progressing well and demonstrating good progress towards goals.    Comorbidities OA, LBP, HLD, heart murmur, asthma, DM, CAD, anemia, ADD, achilles tenotomy, L heart cath & angio 06/2017, coronary stent 06/2017, C4-6 fusion    Rehab Potential Good    PT Frequency 2x / week    PT Treatment/Interventions ADLs/Self Care Home Management;Cryotherapy;Electrical Stimulation;Moist Heat;Therapeutic exercise;Therapeutic activities;Functional mobility training;Ultrasound;Neuromuscular re-education;Patient/family education;Manual techniques;Vasopneumatic Device;Taping;Energy conservation;Dry needling;Passive range of motion    PT Next Visit Plan gentle elbow and shoulder strengthening, shoulder AAROM and stretching to address remaining limitations    Consulted and Agree with Plan of Care Patient           Patient will benefit from skilled therapeutic intervention in order to improve the following deficits and impairments:  Hypomobility, Impaired sensation, Decreased activity tolerance, Decreased strength, Increased fascial restricitons, Impaired UE functional use, Pain, Improper body mechanics, Decreased range of motion, Postural dysfunction, Impaired flexibility, Decreased scar mobility  Visit Diagnosis: Acute pain of right shoulder  Stiffness of right shoulder, not elsewhere classified  Stiffness of right elbow, not elsewhere classified  Abnormal posture     Problem List Patient Active Problem List   Diagnosis Date Noted  . Myofascial pain syndrome, cervical 07/28/2019  . Lateral epicondylitis of right  elbow 07/28/2019  . Nontraumatic incomplete tear of right rotator cuff 07/25/2019  . Tendinopathy of right biceps tendon 07/25/2019  . Erectile dysfunction 07/11/2019  . Mild persistent asthma/cough variant asthma 04/13/2019  . Allergic rhinitis with a possible nonallergic component 04/13/2019  . Acid reflux 04/13/2019  . Cough 04/13/2019  . Hoarseness 04/13/2019  . Cervical radicular pain 01/10/2019  . Ulnar neuropathy at elbow of right upper extremity 01/03/2019  . Acute bilateral low back pain without sciatica 01/03/2019  . Onychomycosis 05/25/2018  . Palpitations 07/06/2017  . CAD (coronary artery disease) 07/05/2017  . Family history of early CAD 07/05/2017  . Abnormal findings on diagnostic imaging of cardiovascular system 06/23/2017  . Low vitamin D level 04/22/2017  . Pain in right ankle and joints of right foot 10/05/2016  . Hemarthrosis of right elbow 02/03/2016  . Small thenar eminence 02/03/2016  . Cervical disc disorder with radiculopathy of cervical region 10/07/2015  . Incomplete rotator cuff tear 09/10/2015  . Nonischemic cardiomyopathy ([place]) 05/15/2015  . Fatigue 05/15/2015  . Dyspnea on exertion 05/15/2015  . Decreased cardiac ejection fraction 04/02/2015  . Strain of latissimus dorsi muscle 01/21/2015  . Shoulder bursitis 10/12/2014  . Stenosis of lateral recess of lumbar spine 10/12/2014  . BPPV (benign paroxysmal positional vertigo) 08/14/2014  . Hyperlipidemia 01/19/2014  . Anemia, unspecified 01/19/2014  . ADD (attention deficit disorder) 01/19/2014  . Type 2 diabetes mellitus with hyperglycemia, without long-term current use of insulin ([place]) 12/22/2013  . Hypogonadism in male 12/22/2013  . Arthritis of hand, left 12/22/2013     Janene Harvey, PT, DPT 10/27/19 12:10 PM   [facility] [address]  [place], [place], [address] Phone: [phone]   Fax:  [phone]  Name: DOV DILL MRN: [id number] Date of Birth: Jun 24, 1955

## 2019-10-30 ENCOUNTER — Other Ambulatory Visit: Payer: Self-pay

## 2019-10-30 ENCOUNTER — Encounter: Payer: Self-pay | Admitting: Physical Therapy

## 2019-10-30 ENCOUNTER — Ambulatory Visit: Payer: 59 | Admitting: Physical Therapy

## 2019-10-30 DIAGNOSIS — M25621 Stiffness of right elbow, not elsewhere classified: Secondary | ICD-10-CM

## 2019-10-30 DIAGNOSIS — M25611 Stiffness of right shoulder, not elsewhere classified: Secondary | ICD-10-CM

## 2019-10-30 DIAGNOSIS — M25511 Pain in right shoulder: Secondary | ICD-10-CM

## 2019-10-30 DIAGNOSIS — R293 Abnormal posture: Secondary | ICD-10-CM

## 2019-10-30 NOTE — Therapy (Signed)
[facility] [address]  [place], [place], [address] Phone: [phone]   Fax:  [phone]  Physical Therapy Treatment  Patient Details  Name: Reginald Rios MRN: [id number] Date of Birth: 12-25-1955 Referring Provider (PT): Frankey Shown, MD   Encounter Date: 10/30/2019   PT End of Session - 10/30/19 1402    Visit Number 9    Number of Visits 13    Date for PT Re-Evaluation 10/30/19    Authorization Type [facility]    PT Start Time 1312    PT Stop Time 1359    PT Time Calculation (min) 47 min    Activity Tolerance Patient tolerated treatment well    Behavior During Therapy [facility] for tasks assessed/performed           Past Medical History:  Diagnosis Date  . ADD (attention deficit disorder)   . Anemia   . Arthritis   . CAD (coronary artery disease)    2/19 PCI/DESx1 to Lcx  . Diabetes mellitus without complication ([place])   . Exercise-induced asthma   . Heart murmur   . Hyperlipidemia    family hx of high cholesterol  . Hypogonadism in male   . Low back pain   . Osteoarthritis     Past Surgical History:  Procedure Laterality Date  . CORONARY STENT INTERVENTION N/A 06/23/2017   Procedure: CORONARY STENT INTERVENTION;  Surgeon: Leonie Man, MD;  Location: [place] [facility];  Service: Cardiovascular;  Laterality: N/A;  . INTRAVASCULAR PRESSURE WIRE/FFR STUDY N/A 06/23/2017   Procedure: INTRAVASCULAR PRESSURE WIRE/FFR STUDY;  Surgeon: Leonie Man, MD;  Location: [place] [facility];  Service: Cardiovascular;  Laterality: N/A;  . KNEE ARTHROSCOPY    . LEFT HEART CATH AND CORONARY ANGIOGRAPHY N/A 06/23/2017   Procedure: LEFT HEART CATH AND CORONARY ANGIOGRAPHY;  Surgeon: Leonie Man, MD;  Location: [place] [facility];  Service: Cardiovascular;  Laterality: N/A;  . SHOULDER ARTHROSCOPY WITH BICEPSTENOTOMY Right 09/06/2019   Procedure: SHOULDER ARTHROSCOPY WITH BICEPSTENOTOMY;  Surgeon: Leandrew Koyanagi,  MD;  Location: [place];  Service: Orthopedics;  Laterality: Right;  . SHOULDER ARTHROSCOPY WITH SUBACROMIAL DECOMPRESSION Right 09/06/2019   Procedure: RIGHT SHOULDER ARTHROSCOPY WITH EXTENSIVE DEBRIDEMENT, SUBACROMIAL DECOMPRESSION, BICEPS TENOTOMY;  Surgeon: Leandrew Koyanagi, MD;  Location: [place];  Service: Orthopedics;  Laterality: Right;  . TENOTOMY ACHILLES TENDON      There were no vitals filed for this visit.   Subjective Assessment - 10/30/19 1313    Subjective Played pickle ball for 3 hours over the weekend without issue. Feels that the stretching last session helped, requesting to work on this again.    Pertinent History OA, LBP, HLD, heart murmur, asthma, DM, CAD, anemia, ADD, achilles tenotomy, L heart cath & angio 06/2017, coronary stent 06/2017, C4-6 fusion    Diagnostic tests none recent    Patient Stated Goals "Being able to lift something, comb my hair"    Currently in Pain? No/denies                             [facility] Adult PT Treatment/Exercise - 10/30/19 0001      Elbow Exercises   Elbow Flexion Strengthening;Right;10 reps;Seated    Bar Weights/Barbell (Elbow Flexion) 3 lbs    Elbow Flexion Limitations biceps curl      Shoulder Exercises: Seated   Flexion Strengthening;Right;10 reps;Theraband  Flexion Weight (lbs) 2    Flexion Limitations thumb up; cues to avoid forceful pushing into end-range    Abduction Strengthening;AROM;Right;5 reps    ABduction Weight (lbs) 2    ABduction Limitations 2x5; thumb up; limited ROM d/t weakness      Shoulder Exercises: ROM/Strengthening   UBE (Upper Arm Bike) L2.0 x 3 min forward/3 min back      Shoulder Exercises: Stretch   Other Shoulder Stretches R shoulder IR and ER stretch with strap 5x5" to tolerance      Manual Therapy   Manual Therapy Passive ROM;Joint mobilization    Manual therapy comments supine, L sidelying    Joint Mobilization R shoulder inferior,  posterior, anterior joint mobs grade III/IV, distraction grade III to tolerance     Passive ROM R shoulder PROM in flexion, abduction, ER with prolonged holds at end ranges with stabilization of scapula                  PT Education - 10/30/19 1401    Education Details update to HEP and discussion on progression of HEP exercises and ADLs    Person(s) Educated Patient    Methods Explanation;Demonstration;Tactile cues;Verbal cues;Handout    Comprehension Verbalized understanding;Returned demonstration            PT Short Term Goals - 10/09/19 1509      PT SHORT TERM GOAL #1   Title Patient to be independent with initial HEP.    Time 3    Period Weeks    Status Achieved    Target Date 10/09/19             PT Long Term Goals - 10/09/19 1558      PT LONG TERM GOAL #1   Title Patient to be independent with advanced HEP.    Time 6    Period Weeks    Status On-going      PT LONG TERM GOAL #2   Title Patient to demonstrate [facility] and pain-free R shoulder AROM/PROM.    Time 6    Period Weeks    Status On-going      PT LONG TERM GOAL #3   Title Patient to demonstrate >=4+/5 strength in R UE and demonstrate symmetrical grip strength in B hands.    Time 6    Period Weeks    Status On-going      PT LONG TERM GOAL #4   Title Patient to report 70% improvement in pain levels with overhead reaching.    Time 6    Period Weeks    Status On-going   10/09/19: notes 15% improvement     PT LONG TERM GOAL #5   Title Patient to return to pain-free pickleball with modifications as needed.    Time 6    Period Weeks    Status On-going                 Plan - 10/30/19 1403    Clinical Impression Statement Patient seemingly more upbeat this session d/t the fact that he was able to play pickle ball for 3 hours over the weekend without issue. Does note perceived improvement in shoulder tightness after MT last session, and requesting this again today. Patient tolerated R  shoulder join mobilizations and PROM with prolonged holds without complaints. Patient remarking on remaining difficulty reaching behind the back, thus worked in [employer] and ER stretching with strap to tolerable limit. Patient was able to increase weighted resistance with biceps  curls and upright shoulder flexion and abduction. Patient with more difficulty with abduction vs. flexion and required cues to avoid forceful pushing into end-range motion. Patient reported understanding to HEP update and without complaints at end of session.    Comorbidities OA, LBP, HLD, heart murmur, asthma, DM, CAD, anemia, ADD, achilles tenotomy, L heart cath & angio 06/2017, coronary stent 06/2017, C4-6 fusion    Rehab Potential Good    PT Frequency 2x / week    PT Treatment/Interventions ADLs/Self Care Home Management;Cryotherapy;Electrical Stimulation;Moist Heat;Therapeutic exercise;Therapeutic activities;Functional mobility training;Ultrasound;Neuromuscular re-education;Patient/family education;Manual techniques;Vasopneumatic Device;Taping;Energy conservation;Dry needling;Passive range of motion    PT Next Visit Plan gentle elbow and shoulder strengthening, shoulder AAROM and stretching to address remaining limitations    Consulted and Agree with Plan of Care Patient           Patient will benefit from skilled therapeutic intervention in order to improve the following deficits and impairments:  Hypomobility, Impaired sensation, Decreased activity tolerance, Decreased strength, Increased fascial restricitons, Impaired UE functional use, Pain, Improper body mechanics, Decreased range of motion, Postural dysfunction, Impaired flexibility, Decreased scar mobility  Visit Diagnosis: Acute pain of right shoulder  Stiffness of right shoulder, not elsewhere classified  Stiffness of right elbow, not elsewhere classified  Abnormal posture     Problem List Patient Active Problem List   Diagnosis Date Noted  . Myofascial  pain syndrome, cervical 07/28/2019  . Lateral epicondylitis of right elbow 07/28/2019  . Nontraumatic incomplete tear of right rotator cuff 07/25/2019  . Tendinopathy of right biceps tendon 07/25/2019  . Erectile dysfunction 07/11/2019  . Mild persistent asthma/cough variant asthma 04/13/2019  . Allergic rhinitis with a possible nonallergic component 04/13/2019  . Acid reflux 04/13/2019  . Cough 04/13/2019  . Hoarseness 04/13/2019  . Cervical radicular pain 01/10/2019  . Ulnar neuropathy at elbow of right upper extremity 01/03/2019  . Acute bilateral low back pain without sciatica 01/03/2019  . Onychomycosis 05/25/2018  . Palpitations 07/06/2017  . CAD (coronary artery disease) 07/05/2017  . Family history of early CAD 07/05/2017  . Abnormal findings on diagnostic imaging of cardiovascular system 06/23/2017  . Low vitamin D level 04/22/2017  . Pain in right ankle and joints of right foot 10/05/2016  . Hemarthrosis of right elbow 02/03/2016  . Small thenar eminence 02/03/2016  . Cervical disc disorder with radiculopathy of cervical region 10/07/2015  . Incomplete rotator cuff tear 09/10/2015  . Nonischemic cardiomyopathy ([place]) 05/15/2015  . Fatigue 05/15/2015  . Dyspnea on exertion 05/15/2015  . Decreased cardiac ejection fraction 04/02/2015  . Strain of latissimus dorsi muscle 01/21/2015  . Shoulder bursitis 10/12/2014  . Stenosis of lateral recess of lumbar spine 10/12/2014  . BPPV (benign paroxysmal positional vertigo) 08/14/2014  . Hyperlipidemia 01/19/2014  . Anemia, unspecified 01/19/2014  . ADD (attention deficit disorder) 01/19/2014  . Type 2 diabetes mellitus with hyperglycemia, without long-term current use of insulin ([place]) 12/22/2013  . Hypogonadism in male 12/22/2013  . Arthritis of hand, left 12/22/2013     Janene Harvey, PT, DPT 10/30/19 2:16 PM   [facility] [address]  [place], [place], [address] Phone: [phone]   Fax:  [phone]  Name: NYCHOLAS RAYNER MRN: [id number] Date of Birth: 23-Sep-1955

## 2019-11-02 MED FILL — LOSARTAN POTASSIUM 25 MG TA: 25 | 90 days supply | Qty: 90 | Fill #1

## 2019-11-09 ENCOUNTER — Other Ambulatory Visit: Payer: Self-pay | Admitting: Family Medicine

## 2019-11-09 ENCOUNTER — Ambulatory Visit: Payer: 59 | Attending: Family Medicine | Admitting: Physical Therapy

## 2019-11-09 ENCOUNTER — Other Ambulatory Visit: Payer: Self-pay

## 2019-11-09 ENCOUNTER — Encounter: Payer: Self-pay | Admitting: Physical Therapy

## 2019-11-09 DIAGNOSIS — M25511 Pain in right shoulder: Secondary | ICD-10-CM | POA: Insufficient documentation

## 2019-11-09 DIAGNOSIS — M5412 Radiculopathy, cervical region: Secondary | ICD-10-CM

## 2019-11-09 DIAGNOSIS — M25621 Stiffness of right elbow, not elsewhere classified: Secondary | ICD-10-CM | POA: Diagnosis not present

## 2019-11-09 DIAGNOSIS — M25611 Stiffness of right shoulder, not elsewhere classified: Secondary | ICD-10-CM | POA: Insufficient documentation

## 2019-11-09 DIAGNOSIS — M79601 Pain in right arm: Secondary | ICD-10-CM

## 2019-11-09 DIAGNOSIS — R293 Abnormal posture: Secondary | ICD-10-CM | POA: Insufficient documentation

## 2019-11-09 MED FILL — JANUVIA 100 MG TABLET: 100 | 30 days supply | Qty: 30 | Fill #1

## 2019-11-09 MED FILL — FARXIGA 10 MG TABLET: 10 | 30 days supply | Qty: 30 | Fill #1

## 2019-11-09 MED FILL — PREGABALIN 75 MG CAPS: 75 | 30 days supply | Qty: 60 | Fill #0

## 2019-11-09 NOTE — Therapy (Addendum)
[facility] [address]  [place], [place], [address] Phone: [phone]   Fax:  [phone]  Physical Therapy Progress Note  Patient Details  Name: Reginald Rios MRN: [id number] Date of Birth: 1955/05/19 Referring Provider (PT): Frankey Shown, MD   Encounter Date: 11/09/2019   PT End of Session - 11/09/19 1755    Visit Number 10    Number of Visits 13    Date for PT Re-Evaluation 10/30/19    Authorization Type [facility]    PT Start Time 9379    PT Stop Time 1751    PT Time Calculation (min) 46 min    Activity Tolerance Patient tolerated treatment well    Behavior During Therapy [facility] for tasks assessed/performed           Past Medical History:  Diagnosis Date  . ADD (attention deficit disorder)   . Anemia   . Arthritis   . CAD (coronary artery disease)    2/19 PCI/DESx1 to Lcx  . Diabetes mellitus without complication ([place])   . Exercise-induced asthma   . Heart murmur   . Hyperlipidemia    family hx of high cholesterol  . Hypogonadism in male   . Low back pain   . Osteoarthritis     Past Surgical History:  Procedure Laterality Date  . CORONARY STENT INTERVENTION N/A 06/23/2017   Procedure: CORONARY STENT INTERVENTION;  Surgeon: Leonie Man, MD;  Location: [place] [facility];  Service: Cardiovascular;  Laterality: N/A;  . INTRAVASCULAR PRESSURE WIRE/FFR STUDY N/A 06/23/2017   Procedure: INTRAVASCULAR PRESSURE WIRE/FFR STUDY;  Surgeon: Leonie Man, MD;  Location: [place] [facility];  Service: Cardiovascular;  Laterality: N/A;  . KNEE ARTHROSCOPY    . LEFT HEART CATH AND CORONARY ANGIOGRAPHY N/A 06/23/2017   Procedure: LEFT HEART CATH AND CORONARY ANGIOGRAPHY;  Surgeon: Leonie Man, MD;  Location: [place] [facility];  Service: Cardiovascular;  Laterality: N/A;  . SHOULDER ARTHROSCOPY WITH BICEPSTENOTOMY Right 09/06/2019   Procedure: SHOULDER ARTHROSCOPY WITH BICEPSTENOTOMY;  Surgeon: Leandrew Koyanagi, MD;  Location: [place];  Service: Orthopedics;  Laterality: Right;  . SHOULDER ARTHROSCOPY WITH SUBACROMIAL DECOMPRESSION Right 09/06/2019   Procedure: RIGHT SHOULDER ARTHROSCOPY WITH EXTENSIVE DEBRIDEMENT, SUBACROMIAL DECOMPRESSION, BICEPS TENOTOMY;  Surgeon: Leandrew Koyanagi, MD;  Location: [place];  Service: Orthopedics;  Laterality: Right;  . TENOTOMY ACHILLES TENDON      There were no vitals filed for this visit.   Subjective Assessment - 11/09/19 1707    Subjective Able to play 2-3 hours of pickle ball a day and has also tried some tennis without pain. Working on HEP with some remaining stiffness, but no pain. Feels like he is ready to wrap up with PT today.    Pertinent History OA, LBP, HLD, heart murmur, asthma, DM, CAD, anemia, ADD, achilles tenotomy, L heart cath & angio 06/2017, coronary stent 06/2017, C4-6 fusion    Diagnostic tests none recent    Patient Stated Goals "Being able to lift something, comb my hair"    Currently in Pain? No/denies              [facility] PT Assessment - 11/09/19 0001      AROM   Right Shoulder Flexion 146 Degrees    Right Shoulder ABduction 127 Degrees    Right Shoulder Internal Rotation --   FIR to L5, to L2 after MT   Right Shoulder External Rotation --  FER to C6     PROM   Right Shoulder Flexion 156 Degrees    Right Shoulder ABduction 146 Degrees    Right Shoulder Internal Rotation 95 Degrees    Right Shoulder External Rotation 65 Degrees      Strength   Right Shoulder Flexion 4+/5    Right Shoulder ABduction 4+/5    Right Shoulder Internal Rotation 4+/5    Right Shoulder External Rotation 4+/5    Right Elbow Flexion 4+/5    Right Elbow Extension 4+/5    Right Hand Grip (lbs) 45   45,45,45                        [facility] Adult PT Treatment/Exercise - 11/09/19 0001      Shoulder Exercises: Standing   Flexion Strengthening;Right;5 reps    Shoulder Flexion Weight (lbs) 3    ABduction  Strengthening;Right;5 reps    Shoulder ABduction Weight (lbs) 3    ABduction Limitations difficulty d/t weakness      Shoulder Exercises: ROM/Strengthening   UBE (Upper Arm Bike) L2.5 x 3 min forward/3 min back      Manual Therapy   Manual Therapy Passive ROM    Manual therapy comments supine    Joint Mobilization R shoulder posterior mobs grade III/IV    Passive ROM R shoulder PROM in all planes to tolerance                  PT Education - 11/09/19 1754    Education Details discussion on objective progress and remaining impairments, review and consolidation of HEP    Person(s) Educated Patient    Methods Explanation;Demonstration;Verbal cues;Tactile cues;Handout    Comprehension Verbalized understanding;Returned demonstration            PT Short Term Goals - 10/09/19 1509      PT SHORT TERM GOAL #1   Title Patient to be independent with initial HEP.    Time 3    Period Weeks    Status Achieved    Target Date 10/09/19             PT Long Term Goals - 11/09/19 1712      PT LONG TERM GOAL #1   Title Patient to be independent with advanced HEP.    Time 6    Period Weeks    Status Achieved      PT LONG TERM GOAL #2   Title Patient to demonstrate [facility] and pain-free R shoulder AROM/PROM.    Time 6    Period Weeks    Status Partially Met   AROM improved in flexion and abduction and PROM in all planes     PT LONG TERM GOAL #3   Title Patient to demonstrate >=4+/5 strength in R UE and demonstrate symmetrical grip strength in B hands.    Time 6    Period Weeks    Status Achieved      PT LONG TERM GOAL #4   Title Patient to report 70% improvement in pain levels with overhead reaching.    Time 6    Period Weeks    Status Achieved   11/08/19: notes 80% improvement     PT LONG TERM GOAL #5   Title Patient to return to pain-free pickleball with modifications as needed.    Time 6    Period Weeks    Status Achieved  Plan - 11/09/19  1755    Clinical Impression Statement Patient reporting that he was able to play 2-3 hours of pickle ball a day and has also tried some tennis without pain. Still notes some remaining stiffness, but no pain. R shoulder AROM has improved in flexion and abduction as well as PROM in all planes. AROM still considerably limited in IR and ER. Strength goal met for shoulder and elbows, with R grip strength very slightly decreased on R vs L. Overhead reaching goal also met today. Patient was thoroughly educated on progressive stretching and strengthening HEP to address remaining deficits. Patient reported understanding and without complaints at end of session. Patient has met or partially met all goals at this time and is ready for transition to HEP on 30 day hold.    Comorbidities OA, LBP, HLD, heart murmur, asthma, DM, CAD, anemia, ADD, achilles tenotomy, L heart cath & angio 06/2017, coronary stent 06/2017, C4-6 fusion    Rehab Potential Good    PT Frequency 2x / week    PT Treatment/Interventions ADLs/Self Care Home Management;Cryotherapy;Electrical Stimulation;Moist Heat;Therapeutic exercise;Therapeutic activities;Functional mobility training;Ultrasound;Neuromuscular re-education;Patient/family education;Manual techniques;Vasopneumatic Device;Taping;Energy conservation;Dry needling;Passive range of motion    PT Next Visit Plan 30 day hold at this time    Consulted and Agree with Plan of Care Patient           Patient will benefit from skilled therapeutic intervention in order to improve the following deficits and impairments:  Hypomobility, Impaired sensation, Decreased activity tolerance, Decreased strength, Increased fascial restricitons, Impaired UE functional use, Pain, Improper body mechanics, Decreased range of motion, Postural dysfunction, Impaired flexibility, Decreased scar mobility  Visit Diagnosis: Acute pain of right shoulder  Stiffness of right shoulder, not elsewhere  classified  Stiffness of right elbow, not elsewhere classified  Abnormal posture     Problem List Patient Active Problem List   Diagnosis Date Noted  . Myofascial pain syndrome, cervical 07/28/2019  . Lateral epicondylitis of right elbow 07/28/2019  . Nontraumatic incomplete tear of right rotator cuff 07/25/2019  . Tendinopathy of right biceps tendon 07/25/2019  . Erectile dysfunction 07/11/2019  . Mild persistent asthma/cough variant asthma 04/13/2019  . Allergic rhinitis with a possible nonallergic component 04/13/2019  . Acid reflux 04/13/2019  . Cough 04/13/2019  . Hoarseness 04/13/2019  . Cervical radicular pain 01/10/2019  . Ulnar neuropathy at elbow of right upper extremity 01/03/2019  . Acute bilateral low back pain without sciatica 01/03/2019  . Onychomycosis 05/25/2018  . Palpitations 07/06/2017  . CAD (coronary artery disease) 07/05/2017  . Family history of early CAD 07/05/2017  . Abnormal findings on diagnostic imaging of cardiovascular system 06/23/2017  . Low vitamin D level 04/22/2017  . Pain in right ankle and joints of right foot 10/05/2016  . Hemarthrosis of right elbow 02/03/2016  . Small thenar eminence 02/03/2016  . Cervical disc disorder with radiculopathy of cervical region 10/07/2015  . Incomplete rotator cuff tear 09/10/2015  . Nonischemic cardiomyopathy ([place]) 05/15/2015  . Fatigue 05/15/2015  . Dyspnea on exertion 05/15/2015  . Decreased cardiac ejection fraction 04/02/2015  . Strain of latissimus dorsi muscle 01/21/2015  . Shoulder bursitis 10/12/2014  . Stenosis of lateral recess of lumbar spine 10/12/2014  . BPPV (benign paroxysmal positional vertigo) 08/14/2014  . Hyperlipidemia 01/19/2014  . Anemia, unspecified 01/19/2014  . ADD (attention deficit disorder) 01/19/2014  . Type 2 diabetes mellitus with hyperglycemia, without long-term current use of insulin ([place]) 12/22/2013  . Hypogonadism in male 12/22/2013  . Arthritis  of hand, left  12/22/2013     Janene Harvey, PT, DPT 11/09/19 6:01 PM   [facility] [address]  [place], [place], [address] Phone: [phone]   Fax:  [phone]  Name: Reginald Rios MRN: [id number] Date of Birth: [birth year]    PHYSICAL THERAPY DISCHARGE SUMMARY  Visits from Start of Care: 10  Current functional level related to goals / functional outcomes: See above clinical impression; patient did not return during 30 day hold   Remaining deficits: Decreased shoulder ROM   Education / Equipment: HEP  Plan: Patient agrees to discharge.  Patient goals were partially met. Patient is being discharged due to meeting the stated rehab goals.  ?????     Janene Harvey, PT, DPT 01/02/20 10:46 AM

## 2019-11-10 ENCOUNTER — Ambulatory Visit: Payer: 59 | Admitting: Podiatry

## 2019-11-17 ENCOUNTER — Telehealth: Payer: Self-pay | Admitting: Podiatry

## 2019-11-17 ENCOUNTER — Other Ambulatory Visit: Payer: Self-pay

## 2019-11-17 ENCOUNTER — Ambulatory Visit: Payer: 59 | Admitting: Podiatry

## 2019-11-17 DIAGNOSIS — B351 Tinea unguium: Secondary | ICD-10-CM | POA: Diagnosis not present

## 2019-11-17 DIAGNOSIS — M7752 Other enthesopathy of left foot: Secondary | ICD-10-CM

## 2019-11-17 DIAGNOSIS — M2041 Other hammer toe(s) (acquired), right foot: Secondary | ICD-10-CM | POA: Diagnosis not present

## 2019-11-17 NOTE — Telephone Encounter (Signed)
Will do a basic fungal culture through [facility] should not be an insurance issue

## 2019-11-17 NOTE — Progress Notes (Signed)
  Subjective:  Patient ID: Reginald Rios, male    DOB: 1955-08-28,  MRN: [id number]  Chief Complaint  Patient presents with  . Follow-up    F/u Left foot capsulitis     64 y.o. male presents with the above complaint. History confirmed with patient.   Objective:  Physical Exam: warm, good capillary refill, no trophic changes or ulcerative lesions, normal DP and PT pulses and normal sensory exam. Left Foot: tenderness of the 2nd metatarsal head  left hallux nail with thickening white powder dystrophy Right Foot: no POP hammmertoe 2nd toe  Assessment:   1. Capsulitis of metatarsophalangeal (MTP) joint of left foot   2. Onychomycosis    Plan:  Patient was evaluated and treated and all questions answered.  Capsulitis -Final injection as below -Cast for CMOs today  Procedure: Joint Injection Location: Left 2nd joint Skin Prep: Alcohol. Injectate: 0.5 cc 1% lidocaine plain, 0.5 cc dexamethasone phosphate. Disposition: Patient tolerated procedure well. Injection site dressed with a band-aid.  Onychomycosis -Nail sample for histology and pathology  Return in about 1 month (around 12/18/2019) for Capsulitis.

## 2019-11-17 NOTE — Telephone Encounter (Signed)
Pt called and stated that he did not want the sample sent off for testing if insurance does not cover

## 2019-11-20 ENCOUNTER — Other Ambulatory Visit: Payer: Self-pay | Admitting: Family Medicine

## 2019-11-20 MED FILL — METFORMIN HCL 1000 MG TABS: 1000 | 90 days supply | Qty: 180 | Fill #2

## 2019-11-20 MED FILL — PIOGLITAZONE HCL 30 MG TAB: 30 | 30 days supply | Qty: 30 | Fill #0

## 2019-11-21 NOTE — Addendum Note (Signed)
Addended by: Dorris Singh on: 11/21/2019 08:46 AM   Modules accepted: Orders

## 2019-11-22 ENCOUNTER — Ambulatory Visit (INDEPENDENT_AMBULATORY_CARE_PROVIDER_SITE_OTHER): Payer: 59 | Admitting: Physician Assistant

## 2019-11-22 ENCOUNTER — Encounter: Payer: Self-pay | Admitting: Orthopaedic Surgery

## 2019-11-22 VITALS — Ht 70.0 in | Wt 192.0 lb

## 2019-11-22 DIAGNOSIS — Z9889 Other specified postprocedural states: Secondary | ICD-10-CM

## 2019-11-22 NOTE — Progress Notes (Signed)
Post-Op Visit Note   Patient: Reginald Rios           Date of Birth: 09/17/1955           MRN: [id number] Visit Date: 11/22/2019 PCP: [name], DO   Assessment & Plan:  Chief Complaint:  Chief Complaint  Patient presents with  . Right Shoulder - Follow-up    09/13/2019 Right Shoulder Arthroscopy   Visit Diagnoses:  1. S/P arthroscopy of right shoulder     Plan: Patient is a very pleasant 64 year old gentleman who comes in today little over 2 months out right shoulder arthroscopic distal clavicle excision and subacromial decompression 09/13/2019.  He has been doing well.  He has been playing pickle ball without any issues.  He did have to help lift his 70 pound dog into the car yesterday and he has had increased achiness to the shoulder, but nothing more.  He has completed physical therapy.  Examination of his right shoulder reveals near full forward flexion and external rotation.  He can internally rotate to L5.  4-1/2 out of 5 strength throughout.  He is neurovascular intact distally.  At this point, he will continue working on his home exercise program.  He will continue to advance with activity as tolerated.  Follow-up with [place] as needed.  Follow-Up Instructions: Return if symptoms worsen or fail to improve.   Orders:  No orders of the defined types were placed in this encounter.  No orders of the defined types were placed in this encounter.   Imaging: No new imaging  PMFS History: Patient Active Problem List   Diagnosis Date Noted  . Myofascial pain syndrome, cervical 07/28/2019  . Lateral epicondylitis of right elbow 07/28/2019  . Nontraumatic incomplete tear of right rotator cuff 07/25/2019  . Tendinopathy of right biceps tendon 07/25/2019  . Erectile dysfunction 07/11/2019  . Mild persistent asthma/cough variant asthma 04/13/2019  . Allergic rhinitis with a possible nonallergic component 04/13/2019  . Acid reflux 04/13/2019  . Cough 04/13/2019  .  Hoarseness 04/13/2019  . Cervical radicular pain 01/10/2019  . Ulnar neuropathy at elbow of right upper extremity 01/03/2019  . Acute bilateral low back pain without sciatica 01/03/2019  . Onychomycosis 05/25/2018  . Palpitations 07/06/2017  . CAD (coronary artery disease) 07/05/2017  . Family history of early CAD 07/05/2017  . Abnormal findings on diagnostic imaging of cardiovascular system 06/23/2017  . Low vitamin D level 04/22/2017  . Pain in right ankle and joints of right foot 10/05/2016  . Hemarthrosis of right elbow 02/03/2016  . Small thenar eminence 02/03/2016  . Cervical disc disorder with radiculopathy of cervical region 10/07/2015  . Incomplete rotator cuff tear 09/10/2015  . Nonischemic cardiomyopathy ([place]) 05/15/2015  . Fatigue 05/15/2015  . Dyspnea on exertion 05/15/2015  . Decreased cardiac ejection fraction 04/02/2015  . Strain of latissimus dorsi muscle 01/21/2015  . Shoulder bursitis 10/12/2014  . Stenosis of lateral recess of lumbar spine 10/12/2014  . BPPV (benign paroxysmal positional vertigo) 08/14/2014  . Hyperlipidemia 01/19/2014  . Anemia, unspecified 01/19/2014  . ADD (attention deficit disorder) 01/19/2014  . Type 2 diabetes mellitus with hyperglycemia, without long-term current use of insulin ([place]) 12/22/2013  . Hypogonadism in male 12/22/2013  . Arthritis of hand, left 12/22/2013   Past Medical History:  Diagnosis Date  . ADD (attention deficit disorder)   . Anemia   . Arthritis   . CAD (coronary artery disease)    2/19 PCI/DESx1 to Lcx  .  Diabetes mellitus without complication ([place])   . Exercise-induced asthma   . Heart murmur   . Hyperlipidemia    family hx of high cholesterol  . Hypogonadism in male   . Low back pain   . Osteoarthritis     Family History  Problem Relation Age of Onset  . Lung cancer Mother 4  . Cancer Mother        lung cancer  . Coronary artery disease Father 69  . Heart disease Father   . Hypothyroidism Brother    . Food Allergy Son   . Asthma Paternal Aunt   . Colon cancer Neg Hx   . Prostate cancer Neg Hx   . Allergic rhinitis Neg Hx   . Angioedema Neg Hx   . Eczema Neg Hx   . Immunodeficiency Neg Hx   . Urticaria Neg Hx     Past Surgical History:  Procedure Laterality Date  . CORONARY STENT INTERVENTION N/A 06/23/2017   Procedure: CORONARY STENT INTERVENTION;  Surgeon: Leonie Man, MD;  Location: [place] [facility];  Service: Cardiovascular;  Laterality: N/A;  . INTRAVASCULAR PRESSURE WIRE/FFR STUDY N/A 06/23/2017   Procedure: INTRAVASCULAR PRESSURE WIRE/FFR STUDY;  Surgeon: Leonie Man, MD;  Location: [place] [facility];  Service: Cardiovascular;  Laterality: N/A;  . KNEE ARTHROSCOPY    . LEFT HEART CATH AND CORONARY ANGIOGRAPHY N/A 06/23/2017   Procedure: LEFT HEART CATH AND CORONARY ANGIOGRAPHY;  Surgeon: Leonie Man, MD;  Location: [place] [facility];  Service: Cardiovascular;  Laterality: N/A;  . SHOULDER ARTHROSCOPY WITH BICEPSTENOTOMY Right 09/06/2019   Procedure: SHOULDER ARTHROSCOPY WITH BICEPSTENOTOMY;  Surgeon: Leandrew Koyanagi, MD;  Location: [place];  Service: Orthopedics;  Laterality: Right;  . SHOULDER ARTHROSCOPY WITH SUBACROMIAL DECOMPRESSION Right 09/06/2019   Procedure: RIGHT SHOULDER ARTHROSCOPY WITH EXTENSIVE DEBRIDEMENT, SUBACROMIAL DECOMPRESSION, BICEPS TENOTOMY;  Surgeon: Leandrew Koyanagi, MD;  Location: [place];  Service: Orthopedics;  Laterality: Right;  . TENOTOMY ACHILLES TENDON     Social History   Occupational History  . Occupation: Air cabin crew   Tobacco Use  . Smoking status: Never Smoker  . Smokeless tobacco: Never Used  Vaping Use  . Vaping Use: Never used  Substance and Sexual Activity  . Alcohol use: Yes    Alcohol/week: 0.0 standard drinks    Comment: rare  . Drug use: No  . Sexual activity: Not on file

## 2019-11-28 MED FILL — TESTOSTERONE 50 MG/5GM (1%): 50 MG/5GM | 30 days supply | Qty: 300 | Fill #1

## 2019-12-05 LAB — CULTURE, FUNGUS WITHOUT SMEAR
MICRO NUMBER:: 10727392
SPECIMEN QUALITY:: ADEQUATE

## 2019-12-06 ENCOUNTER — Other Ambulatory Visit: Payer: Self-pay | Admitting: Cardiology

## 2019-12-06 MED FILL — EZETIMIBE 10 MG TABS: 10 | 90 days supply | Qty: 90 | Fill #0

## 2019-12-06 MED FILL — CLOPIDOGREL 75 MG TABLET: 75 | 90 days supply | Qty: 90 | Fill #0

## 2019-12-06 MED FILL — JANUVIA 100 MG TABLET: 100 | 30 days supply | Qty: 30 | Fill #2

## 2019-12-06 MED FILL — FARXIGA 10 MG TABLET: 10 | 30 days supply | Qty: 30 | Fill #2

## 2019-12-07 DIAGNOSIS — M5432 Sciatica, left side: Secondary | ICD-10-CM | POA: Diagnosis not present

## 2019-12-07 DIAGNOSIS — M9903 Segmental and somatic dysfunction of lumbar region: Secondary | ICD-10-CM | POA: Diagnosis not present

## 2019-12-08 DIAGNOSIS — M5432 Sciatica, left side: Secondary | ICD-10-CM | POA: Diagnosis not present

## 2019-12-08 DIAGNOSIS — M9903 Segmental and somatic dysfunction of lumbar region: Secondary | ICD-10-CM | POA: Diagnosis not present

## 2019-12-18 DIAGNOSIS — M5432 Sciatica, left side: Secondary | ICD-10-CM | POA: Diagnosis not present

## 2019-12-18 DIAGNOSIS — M9903 Segmental and somatic dysfunction of lumbar region: Secondary | ICD-10-CM | POA: Diagnosis not present

## 2019-12-18 MED FILL — MONTELUKAST SOD 10 MG TAB: 10 | 90 days supply | Qty: 90 | Fill #1

## 2019-12-18 MED FILL — PREGABALIN 75 MG CAPS: 75 | 30 days supply | Qty: 60 | Fill #1

## 2019-12-18 MED FILL — TADALAFIL 20 MG TABS: 20 | 30 days supply | Qty: 6 | Fill #1

## 2019-12-18 MED FILL — METHYLPHENIDATE HCL 20 MG T: 20 | 30 days supply | Qty: 60 | Fill #0

## 2019-12-21 ENCOUNTER — Other Ambulatory Visit: Payer: Self-pay

## 2019-12-21 ENCOUNTER — Ambulatory Visit: Payer: 59 | Admitting: Orthotics

## 2019-12-21 DIAGNOSIS — M2041 Other hammer toe(s) (acquired), right foot: Secondary | ICD-10-CM

## 2019-12-21 DIAGNOSIS — M7752 Other enthesopathy of left foot: Secondary | ICD-10-CM

## 2019-12-21 DIAGNOSIS — M9903 Segmental and somatic dysfunction of lumbar region: Secondary | ICD-10-CM | POA: Diagnosis not present

## 2019-12-21 DIAGNOSIS — M5432 Sciatica, left side: Secondary | ICD-10-CM | POA: Diagnosis not present

## 2019-12-21 NOTE — Progress Notes (Signed)
Patient came in today to p/up functional foot orthotics.   The orthotics were assessed to both fit and function.  The F/O addressed the biomechanical issues/pathologies as intended, offering good longitudinal arch support, proper offloading, and foot support. There weren't any signs of discomfort or irritation.  The F/O fit properly in footwear with minimal trimming/adjustments. 

## 2019-12-25 ENCOUNTER — Other Ambulatory Visit: Payer: Self-pay | Admitting: Family Medicine

## 2019-12-25 DIAGNOSIS — N529 Male erectile dysfunction, unspecified: Secondary | ICD-10-CM

## 2019-12-29 ENCOUNTER — Other Ambulatory Visit: Payer: Self-pay

## 2019-12-29 ENCOUNTER — Ambulatory Visit: Payer: 59 | Admitting: Podiatry

## 2019-12-29 ENCOUNTER — Encounter: Payer: Self-pay | Admitting: Podiatry

## 2019-12-29 DIAGNOSIS — M79676 Pain in unspecified toe(s): Secondary | ICD-10-CM | POA: Diagnosis not present

## 2019-12-29 DIAGNOSIS — L600 Ingrowing nail: Secondary | ICD-10-CM

## 2019-12-29 DIAGNOSIS — M7752 Other enthesopathy of left foot: Secondary | ICD-10-CM

## 2019-12-31 NOTE — Progress Notes (Signed)
  Subjective:  Patient ID: Reginald Rios, male    DOB: 02-13-1956,  MRN: [id number]  Chief Complaint  Patient presents with  . Foot Orthotics    Pt states satisfaction with orthotics, great reduction in pain.  . Nail Problem    Pain in left 1st nail lateral border.    64 y.o. male presents with the above complaint. History confirmed with patient.   Objective:  Physical Exam: warm, good capillary refill, no trophic changes or ulcerative lesions, normal DP and PT pulses and normal sensory exam. Left Foot: tenderness of the 2nd metatarsal head  left hallux nail with thickening white powder dystrophy, medial small area of ingrown nail Right Foot: no POP hammmertoe 2nd toe  Assessment:   1. Capsulitis of metatarsophalangeal (MTP) joint of left foot   2. Ingrown nail   3. Pain around toenail    Plan:  Patient was evaluated and treated and all questions answered.  Capsulitis -CMOs working well  Ingrown nail -Gently debrided in slant back fashion  No follow-ups on file.

## 2020-01-11 MED FILL — TESTOSTERONE 50 MG/5GM (1%): 50 MG/5GM | 30 days supply | Qty: 300 | Fill #2

## 2020-01-11 MED FILL — PIOGLITAZONE HCL 30 MG TAB: 30 | 30 days supply | Qty: 30 | Fill #1

## 2020-01-11 MED FILL — JANUVIA 100 MG TABLET: 100 | 30 days supply | Qty: 30 | Fill #3

## 2020-01-11 MED FILL — FARXIGA 10 MG TABLET: 10 | 30 days supply | Qty: 30 | Fill #3

## 2020-01-14 NOTE — Progress Notes (Signed)
Cardiology Office Note:    Date:  01/15/2020   ID:  Reginald Rios, DOB 11-17-1955, MRN [id number]  PCP:  [name], DO  Cardiologist:  Shirlee More, MD    Referring MD: [name]*    ASSESSMENT:    1. Coronary artery disease involving native coronary artery of native heart without angina pectoris   2. Nonischemic cardiomyopathy ([place])   3. Hyperlipidemia, unspecified hyperlipidemia type   4. Type 2 diabetes mellitus with diabetic polyneuropathy, with long-term current use of insulin (HCC)    PLAN:    In order of problems listed above:  1. Stable having no angina current medical therapy continue current treatment including long-term dual antiplatelet therapy with residual moderate LAD stenosis along with his lipid-lowering combined statin and Zetia.  At this time I do not think he requires a repeat coronary angiogram or stress testing. 2. Improved EF normalized 3. Stable lipids at target continue combined treatment 4. Improved marked improvement in his A1c.  I will ask his primary care physician to consider an alternative to Actos with his cardiovascular disease   Next appointment: 6 months   Medication Adjustments/Labs and Tests Ordered: Current medicines are reviewed at length with the patient today.  Concerns regarding medicines are outlined above.  Orders Placed This Encounter  Procedures  . EKG 12-Lead   No orders of the defined types were placed in this encounter.   Chief Complaint  Patient presents with  . Follow-up  . Coronary Artery Disease    History of Present Illness:    Reginald Rios is a 64 y.o. male with a hx of CAD PCI and stenting of left circumflex coronary artery 06/23/2017 type 2 diabetes mellitus hyperlipidemia sinus bradycardia and previous mild decrease in ejection fraction 50 to 55%.  He was last seen 07/30/2019.  Following that visit he underwent a myocardial perfusion test 07/18/2019 showed ejection fraction  57% normal left ventricular function and normal perfusion.  I independently reviewed the images and I agree. Compliance with diet, lifestyle and medications: Yes  He remains very vigorous active no angina dyspnea palpitation or syncope.  We reviewed his myocardial perfusion study which is very reassuring and normal and his ejection fraction is normalized.  He has done good things with his diabetes and his A1c is markedly improved and tolerates lipid-lowering therapy without muscle pain or weakness. Past Medical History:  Diagnosis Date  . ADD (attention deficit disorder)   . Anemia   . Arthritis   . CAD (coronary artery disease)    2/19 PCI/DESx1 to Lcx  . Diabetes mellitus without complication ([place])   . Exercise-induced asthma   . Heart murmur   . Hyperlipidemia    family hx of high cholesterol  . Hypogonadism in male   . Low back pain   . Osteoarthritis     Past Surgical History:  Procedure Laterality Date  . CORONARY STENT INTERVENTION N/A 06/23/2017   Procedure: CORONARY STENT INTERVENTION;  Surgeon: Leonie Man, MD;  Location: [facility];  Service: Cardiovascular;  Laterality: N/A;  . INTRAVASCULAR PRESSURE WIRE/FFR STUDY N/A 06/23/2017   Procedure: INTRAVASCULAR PRESSURE WIRE/FFR STUDY;  Surgeon: Leonie Man, MD;  Location: [facility];  Service: Cardiovascular;  Laterality: N/A;  . KNEE ARTHROSCOPY    . LEFT HEART CATH AND CORONARY ANGIOGRAPHY N/A 06/23/2017   Procedure: LEFT HEART CATH AND CORONARY ANGIOGRAPHY;  Surgeon: Leonie Man, MD;  Location: [facility];  Service:  Cardiovascular;  Laterality: N/A;  . SHOULDER ARTHROSCOPY WITH BICEPSTENOTOMY Right 09/06/2019   Procedure: SHOULDER ARTHROSCOPY WITH BICEPSTENOTOMY;  Surgeon: Leandrew Koyanagi, MD;  Location: [facility];  Service: Orthopedics;  Laterality: Right;  . SHOULDER ARTHROSCOPY WITH SUBACROMIAL DECOMPRESSION Right 09/06/2019   Procedure: RIGHT SHOULDER ARTHROSCOPY WITH  EXTENSIVE DEBRIDEMENT, SUBACROMIAL DECOMPRESSION, BICEPS TENOTOMY;  Surgeon: Leandrew Koyanagi, MD;  Location: [facility];  Service: Orthopedics;  Laterality: Right;  . TENOTOMY ACHILLES TENDON      Current Medications: Current Meds  Medication Sig  . albuterol (VENTOLIN HFA) 108 (90 Base) MCG/ACT inhaler 2 puffs every 4-6 hours as needed for coughing or wheezing spells  . aspirin EC 81 MG tablet Take 81 mg by mouth daily.  . Blood Glucose Monitoring Suppl (FREESTYLE FREEDOM LITE) w/Device KIT Use to check blood sugar daily.  . clopidogrel (PLAVIX) 75 MG tablet TAKE 1 TABLET (75 MG TOTAL) BY MOUTH DAILY WITH BREAKFAST.  Marland Kitchen Coenzyme Q10 200 MG capsule Take 200 mg by mouth daily.  Marland Kitchen ezetimibe (ZETIA) 10 MG tablet TAKE 1 TABLET BY MOUTH ONCE DAILY  . FARXIGA 10 MG TABS tablet TAKE 1 TABLET BY MOUTH ONCE DAILY  . glucose blood (FREESTYLE LITE) test strip Use daily to check blood sugar.  Marland Kitchen JANUVIA 100 MG tablet TAKE 1 TABLET BY MOUTH ONCE DAILY  . Lancets (FREESTYLE) lancets Use daily to check blood sugar  . lidocaine (LIDODERM) 5 % Place 1 patch onto the skin daily. Remove & Discard patch within 12 hours or as directed by MD  . losartan (COZAAR) 25 MG tablet TAKE 1 TABLET (25 MG TOTAL) BY MOUTH DAILY.  . metFORMIN (GLUCOPHAGE) 1000 MG tablet Take 1 tablet (1,000 mg total) by mouth 2 (two) times daily with a meal.  . methylphenidate (RITALIN) 20 MG tablet TAKE 1 TABLET (20 MG TOTAL) BY MOUTH 2 (TWO) TIMES DAILY WITH BREAKFAST AND LUNCH.  . montelukast (SINGULAIR) 10 MG tablet Take 1 tablet (10 mg total) by mouth daily.  . nitroGLYCERIN (NITROSTAT) 0.4 MG SL tablet Place 1 tablet (0.4 mg total) under the tongue every 5 (five) minutes as needed.  Marland Kitchen omeprazole (PRILOSEC) 20 MG capsule Take 20 mg by mouth every morning.  Marland Kitchen oxyCODONE-acetaminophen (PERCOCET) 5-325 MG tablet Take 1-2 tablets by mouth every 8 (eight) hours as needed for severe pain.  . pioglitazone (ACTOS) 30 MG tablet TAKE 1  TABLET BY MOUTH ONCE DAILY  . pravastatin (PRAVACHOL) 20 MG tablet TAKE 1 TABLET (20 MG TOTAL) BY MOUTH DAILY.  Marland Kitchen pregabalin (LYRICA) 75 MG capsule TAKE 1 CAPSULE (75 MG TOTAL) BY MOUTH 2 (TWO) TIMES DAILY.  . tadalafil (CIALIS) 20 MG tablet Take 0.5-1 tablets (10-20 mg total) by mouth every other day as needed for erectile dysfunction.  Marland Kitchen testosterone (ANDROGEL) 50 MG/5GM (1%) GEL APPLY 2 APPLICATIONS (10 GRAMS) ONTO THE SKIN DAILY     Allergies:   Invokana [canagliflozin], Grass pollen([id number]-o-r-t-swt vern), and Lisinopril   Social History   Socioeconomic History  . Marital status: Married    Spouse name: Not on file  . Number of children: Not on file  . Years of education: Not on file  . Highest education level: Not on file  Occupational History  . Occupation: Air cabin crew   Tobacco Use  . Smoking status: Never Smoker  . Smokeless tobacco: Never Used  Vaping Use  . Vaping Use: Never used  Substance and Sexual Activity  . Alcohol use: Yes    Alcohol/week:  0.0 standard drinks    Comment: rare  . Drug use: No  . Sexual activity: Not on file  Other Topics Concern  . Not on file  Social History Narrative   Previously worked as Geophysicist/field seismologist   Currently working at [employer] clinic   Married 30 years   2 children ages 85, 76   Wife is psychiatrist - Dr. Margurite Auerbach   Moved from [place] in [place] Determinants of Health   Financial Resource Strain:   . Difficulty of Paying Living Expenses: Not on file  Food Insecurity:   . Worried About Charity fundraiser in the Last Year: Not on file  . Ran Out of Food in the Last Year: Not on file  Transportation Needs:   . Lack of Transportation (Medical): Not on file  . Lack of Transportation (Non-Medical): Not on file  Physical Activity:   . Days of Exercise per Week: Not on file  . Minutes of Exercise per Session: Not on file  Stress:   . Feeling of Stress : Not on file  Social Connections:   . Frequency of  Communication with Friends and Family: Not on file  . Frequency of Social Gatherings with Friends and Family: Not on file  . Attends Religious Services: Not on file  . Active Member of Clubs or Organizations: Not on file  . Attends Archivist Meetings: Not on file  . Marital Status: Not on file     Family History: The patient's family history includes Asthma in his paternal aunt; Cancer in his mother; Coronary artery disease (age of onset: 17) in his father; Food Allergy in his son; Heart disease in his father; Hypothyroidism in his brother; Lung cancer (age of onset: 19) in his mother. There is no history of Colon cancer, Prostate cancer, Allergic rhinitis, Angioedema, Eczema, Immunodeficiency, or Urticaria. ROS:   Please see the history of present illness.    All other systems reviewed and are negative.  EKGs/Labs/Other Studies Reviewed:    The following studies were reviewed today:  EKG:  EKG ordered today and personally reviewed.  The ekg ordered today demonstrates sinus rhythm and is normal  Recent Labs: 07/11/2019: ALT 21 08/30/2019: BUN 14; Creatinine, Ser 0.80; Potassium 4.6; Sodium 140  Recent Lipid Panel    Component Value Date/Time   CHOL 135 07/11/2019 1328   CHOL 150 04/21/2017 1019   TRIG 116.0 07/11/2019 1328   HDL 44.40 07/11/2019 1328   HDL 53 04/21/2017 1019   CHOLHDL 3 07/11/2019 1328   VLDL 23.2 07/11/2019 1328   LDLCALC 68 07/11/2019 1328   LDLCALC 74 04/21/2017 1019    Physical Exam:    VS:  BP 118/75   Pulse 62   Ht '5\' 10"'  (1.778 m)   Wt 191 lb 1.3 oz (86.7 kg)   SpO2 96%   BMI 27.42 kg/m     Wt Readings from Last 3 Encounters:  01/15/20 191 lb 1.3 oz (86.7 kg)  11/22/19 192 lb (87.1 kg)  10/13/19 192 lb 8 oz (87.3 kg)     GEN:  Well nourished, well developed in no acute distress HEENT: Normal NECK: No JVD; No carotid bruits LYMPHATICS: No lymphadenopathy CARDIAC: RRR, no murmurs, rubs, gallops RESPIRATORY:  Clear to auscultation  without rales, wheezing or rhonchi  ABDOMEN: Soft, non-tender, non-distended MUSCULOSKELETAL:  No edema; No deformity  SKIN: Warm and dry NEUROLOGIC:  Alert and oriented x 3 PSYCHIATRIC:  Normal affect  Signed, Shirlee More, MD  01/15/2020 12:03 PM    [place]

## 2020-01-15 ENCOUNTER — Ambulatory Visit: Payer: 59 | Admitting: Cardiology

## 2020-01-15 ENCOUNTER — Other Ambulatory Visit: Payer: Self-pay

## 2020-01-15 ENCOUNTER — Encounter: Payer: Self-pay | Admitting: Cardiology

## 2020-01-15 VITALS — BP 118/75 | HR 62 | Ht 70.0 in | Wt 191.1 lb

## 2020-01-15 DIAGNOSIS — E1142 Type 2 diabetes mellitus with diabetic polyneuropathy: Secondary | ICD-10-CM

## 2020-01-15 DIAGNOSIS — E785 Hyperlipidemia, unspecified: Secondary | ICD-10-CM | POA: Diagnosis not present

## 2020-01-15 DIAGNOSIS — Z794 Long term (current) use of insulin: Secondary | ICD-10-CM

## 2020-01-15 DIAGNOSIS — I428 Other cardiomyopathies: Secondary | ICD-10-CM

## 2020-01-15 DIAGNOSIS — I2510 Atherosclerotic heart disease of native coronary artery without angina pectoris: Secondary | ICD-10-CM | POA: Diagnosis not present

## 2020-01-15 MED FILL — PRAVASTATIN NA 20 MG TAB: 20 | 90 days supply | Qty: 90 | Fill #2

## 2020-01-15 NOTE — Patient Instructions (Signed)

## 2020-01-17 ENCOUNTER — Ambulatory Visit: Payer: 59

## 2020-01-19 ENCOUNTER — Ambulatory Visit (INDEPENDENT_AMBULATORY_CARE_PROVIDER_SITE_OTHER): Payer: 59

## 2020-01-19 ENCOUNTER — Other Ambulatory Visit: Payer: Self-pay

## 2020-01-19 DIAGNOSIS — Z23 Encounter for immunization: Secondary | ICD-10-CM

## 2020-01-19 NOTE — Progress Notes (Signed)
Patient here today for flu shot. 0.68mL given in left deltoid IM. Patient tolerated well.

## 2020-01-28 DIAGNOSIS — M542 Cervicalgia: Secondary | ICD-10-CM | POA: Diagnosis not present

## 2020-01-29 DIAGNOSIS — Z23 Encounter for immunization: Secondary | ICD-10-CM | POA: Diagnosis not present

## 2020-01-29 MED FILL — PREGABALIN 75 MG CAPS: 75 | 30 days supply | Qty: 60 | Fill #2

## 2020-01-29 MED FILL — LOSARTAN POTASSIUM 25 MG TA: 25 | 90 days supply | Qty: 90 | Fill #2

## 2020-01-30 ENCOUNTER — Ambulatory Visit: Payer: Self-pay

## 2020-01-30 ENCOUNTER — Other Ambulatory Visit: Payer: Self-pay

## 2020-01-30 ENCOUNTER — Encounter: Payer: Self-pay | Admitting: Family Medicine

## 2020-01-30 ENCOUNTER — Ambulatory Visit: Payer: 59 | Admitting: Family Medicine

## 2020-01-30 VITALS — BP 104/64 | HR 97 | Ht 70.0 in | Wt 192.0 lb

## 2020-01-30 DIAGNOSIS — M79604 Pain in right leg: Secondary | ICD-10-CM | POA: Diagnosis not present

## 2020-01-30 DIAGNOSIS — S76311A Strain of muscle, fascia and tendon of the posterior muscle group at thigh level, right thigh, initial encounter: Secondary | ICD-10-CM

## 2020-01-30 NOTE — Patient Instructions (Signed)
Thank you for coming in today.  Plan for physical therapy.  The [place] L Protocol for Hamstring Strains  Lyman Bishop PT  I recommend you obtained a compression sleeve to help with your joint problems. There are many options on the market however I recommend obtaining a full thigh Body Helix compression sleeve.  You can find information (including how to appropriate measure yourself for sizing) can be found at www.Body http://www.lambert.com/.  Many of these products are health savings account (HSA) eligible.   You can use the compression sleeve at any time throughout the day but is most important to use while being active as well as for 2 hours post-activity.   It is appropriate to ice following activity with the compression sleeve in place.    Recheck in 4 weeks.  Return sooner if needed.    Hamstring Strain  A hamstring strain happens when the muscles in the back of the thighs (hamstring muscles) are overstretched or torn. The hamstring muscles are used in straightening the hips, bending the knees, and pulling back the legs. This injury is often called a pulled hamstring muscle. The tissue that connects the muscle to a bone (tendon) may also be affected. The severity of a hamstring strain may be rated in degrees or grades. First-degree (or grade 1) strains have the least amount of muscle tearing and pain. Second-degree and third-degree (grade 2 and 3) strains have increasingly more tearing and pain. What are the causes? This condition is caused by a sudden, violent force being placed on the hamstring muscles, stretching them too far. This often happens during activities that involve running, jumping, kicking, or weight lifting. What increases the risk? Hamstring strains are especially common in athletes. The following factors may also make you more likely to develop this condition:  Having low strength, endurance, or flexibility of the hamstring muscles.  Doing high-impact physical activity or  sports.  Having poor physical fitness.  Having a previous leg injury.  Having tired (fatigued) muscles. What are the signs or symptoms? Symptoms of this condition include:  Pain in the back of the thigh.  Swelling.  Bruising.  Muscle spasms.  Trouble moving the affected muscle because of pain. For severe strains, you may feel popping or snapping in the back of your thigh when the injury occurs. How is this diagnosed? This condition is diagnosed based on your symptoms, your medical history, and a physical exam. How is this treated? Treatment for this condition usually involves:  Protecting, resting, icing, applying compression, and elevating the injured area (PRICE therapy).  Medicines. Your health care provider may recommend medicines to help reduce pain or inflammation.  Doing exercises to regain strength and flexibility in the muscles. Your health care provider will tell you when it is okay to begin exercising. Follow these instructions at home: PRICE therapy Use PRICE therapy to promote muscle healing during the first 2-3 days after your injury, or as told by your health care provider.  Protect the muscle from being injured again.  Rest your injury. This usually involves limiting your normal activities and not using the injured hamstring muscle. Talk with your health care provider about how you should limit your activities.  Apply ice to the injured area: ? Put ice in a plastic bag. ? Place a towel between your skin and the bag. ? Leave the ice on for 20 minutes, 2-3 times a day. After the third day, switch to applying heat as told.  Put pressure (compression) on your  injured hamstring by wrapping it with an elastic bandage. Be careful not to wrap it too tightly. That may interfere with blood circulation or may increase swelling.  Raise (elevate) your injured hamstring above the level of your heart as often as possible. When you are lying down, you can do this by  putting a pillow under your thigh.  Activity  Begin exercising or stretching only as told by your health care provider.  Do not return to full activity level until your health care provider approves.  To help prevent muscle strains in the future, always warm up before exercising and stretch afterward. General instructions  Take over-the-counter and prescription medicines only as told by your health care provider.  If directed, apply heat to the affected area as often as told by your health care provider. Use the heat source that your health care provider recommends, such as a moist heat pack or a heating pad. ? Place a towel between your skin and the heat source. ? Leave the heat on for 20-30 minutes. ? Remove the heat if your skin turns bright red. This is especially important if you are unable to feel pain, heat, or cold. You may have a greater risk of getting burned.  Keep all follow-up visits as told by your health care provider. This is important. Contact a health care provider if you have:  Increasing pain or swelling in the injured area.  Numbness, tingling, or a significant loss of strength in the injured area. Get help right away if:  Your foot or your toes become cold or turn blue. Summary  A hamstring strain happens when the muscles in the back of the thighs (hamstring muscles) are overstretched or torn.  This injury can be caused by a sudden, violent force being placed on the hamstring muscles, causing them to stretch too far.  Symptoms include pain, swelling, and muscle spasms in the injured area.  Treatment includes what is called PRICE therapy: protecting, resting, icing, applying compression, and elevating the injured area. This information is not intended to replace advice given to you by your health care provider. Make sure you discuss any questions you have with your health care provider. Document Revised: 04/02/2017 Document Reviewed: 03/18/2017 Elsevier  Patient Education  2020 [employer].

## 2020-01-30 NOTE — Progress Notes (Signed)
   Rito Ehrlich, am serving as a Education administrator for Dr. Lynne Leader.  Reginald Rios is a 64 y.o. male who presents to [place] at [facility] today for post thigh / hamstring pain.  He was last seen by Dr. Tamala Julian on 02/17/16 for his neck.  Since then, pt reports jumped and hit an overhead in pickleball when he landed he was turned. when he landed he felt a hot stabbing pain in left groin area. States it is all black and blue. States that the Left knee is now hurting from overusing it and is making his chronic low back problem worse.   Radiating pain: no Aggravating factors: bending down, bending leg Treatments tried: Oxycodone; muscle relaxer; tylenol; ice    Pertinent review of systems: No fevers or chills  Relevant historical information: CAD diabetes.  On Plavix   Exam:  BP 104/64 (BP Location: Left Arm, Patient Position: Sitting, Cuff Size: Normal)   Pulse 97   Ht 5\' 10"  (1.778 m)   Wt 192 lb (87.1 kg)   SpO2 98%   BMI 27.55 kg/m  General: Well Developed, well nourished, and in no acute distress.   MSK: Right posterior medial thigh ecchymosis and bruising.  Not particularly tender medial thigh. Posterior thigh normal-appearing tender palpation approximately 3 cm distal to the ischial tuberosity.  No significant muscle defect palpated above. Pain with resisted hip extension and knee flexion. Mild antalgic gait.  Strength is reasonably intact as is range of motion.    Assessment and Plan: 64 y.o. male with hamstring strain/tear with some bruising.  Functionally doing pretty well with reasonable strength.  Plan to proceed with trial of physical therapy and compression sleeve.  If not improving return to clinic in a few weeks.  Return sooner if needed.   PDMP not reviewed this encounter. Orders Placed This Encounter  Procedures  . Ambulatory referral to Physical Therapy    Referral Priority:   Routine    Referral Type:   Physical Medicine    Referral  Reason:   Specialty Services Required    Requested Specialty:   Physical Therapy   No orders of the defined types were placed in this encounter.    Discussed warning signs or symptoms. Please see discharge instructions. Patient expresses understanding.   The above documentation has been reviewed and is accurate and complete Lynne Leader, M.D.

## 2020-01-31 ENCOUNTER — Telehealth: Payer: Self-pay | Admitting: Family Medicine

## 2020-01-31 DIAGNOSIS — S76311A Strain of muscle, fascia and tendon of the posterior muscle group at thigh level, right thigh, initial encounter: Secondary | ICD-10-CM

## 2020-01-31 DIAGNOSIS — M79604 Pain in right leg: Secondary | ICD-10-CM

## 2020-01-31 NOTE — Telephone Encounter (Signed)
Referral sent to [facility].  About [address] further [place] the [address] than the [facility] is.  They do a good job with physical therapy there.  Please let me know if they cannot accommodate you soon.

## 2020-01-31 NOTE — Telephone Encounter (Signed)
Patient called stating that he is not able to get in to see [facility] until the end of October. Is there anywhere else we could send him so that he can be seen sooner?

## 2020-02-01 NOTE — Telephone Encounter (Signed)
Called pt and informed him that a new PT referral was placed to [facility].  Pt verbalizes understanding.

## 2020-02-05 ENCOUNTER — Other Ambulatory Visit: Payer: Self-pay

## 2020-02-05 ENCOUNTER — Ambulatory Visit (INDEPENDENT_AMBULATORY_CARE_PROVIDER_SITE_OTHER): Payer: 59 | Admitting: Rehabilitative and Restorative Service Providers"

## 2020-02-05 ENCOUNTER — Other Ambulatory Visit: Payer: Self-pay | Admitting: Family Medicine

## 2020-02-05 DIAGNOSIS — S76312A Strain of muscle, fascia and tendon of the posterior muscle group at thigh level, left thigh, initial encounter: Secondary | ICD-10-CM

## 2020-02-05 DIAGNOSIS — M25652 Stiffness of left hip, not elsewhere classified: Secondary | ICD-10-CM | POA: Diagnosis not present

## 2020-02-05 DIAGNOSIS — R29898 Other symptoms and signs involving the musculoskeletal system: Secondary | ICD-10-CM

## 2020-02-05 DIAGNOSIS — R531 Weakness: Secondary | ICD-10-CM

## 2020-02-05 DIAGNOSIS — M25651 Stiffness of right hip, not elsewhere classified: Secondary | ICD-10-CM

## 2020-02-05 MED FILL — FARXIGA 10 MG TABLET: 10 | 30 days supply | Qty: 30 | Fill #4

## 2020-02-05 MED FILL — PIOGLITAZONE HCL 30 MG TAB: 30 | 30 days supply | Qty: 30 | Fill #2

## 2020-02-05 NOTE — Therapy (Signed)
[place], [place], [address] Phone: [phone]   Fax:  [phone]  Physical Therapy Treatment  Patient Details  Name: Reginald Rios MRN: [id number] Date of Birth: 1956-01-28 Referring Provider (PT): Dr Lynne Leader    Encounter Date: 02/05/2020   PT End of Session - 02/05/20 1513    Visit Number 1    Number of Visits 12    Date for PT Re-Evaluation 03/18/20    PT Start Time 3734    PT Stop Time 1430    PT Time Calculation (min) 45 min    Activity Tolerance Patient tolerated treatment well           Past Medical History:  Diagnosis Date  . ADD (attention deficit disorder)   . Anemia   . Arthritis   . CAD (coronary artery disease)    2/19 PCI/DESx1 to Lcx  . Diabetes mellitus without complication ([place])   . Exercise-induced asthma   . Heart murmur   . Hyperlipidemia    family hx of high cholesterol  . Hypogonadism in male   . Low back pain   . Osteoarthritis     Past Surgical History:  Procedure Laterality Date  . CORONARY STENT INTERVENTION N/A 06/23/2017   Procedure: CORONARY STENT INTERVENTION;  Surgeon: Leonie Man, MD;  Location: [facility];  Service: Cardiovascular;  Laterality: N/A;  . INTRAVASCULAR PRESSURE WIRE/FFR STUDY N/A 06/23/2017   Procedure: INTRAVASCULAR PRESSURE WIRE/FFR STUDY;  Surgeon: Leonie Man, MD;  Location: [facility];  Service: Cardiovascular;  Laterality: N/A;  . KNEE ARTHROSCOPY    . LEFT HEART CATH AND CORONARY ANGIOGRAPHY N/A 06/23/2017   Procedure: LEFT HEART CATH AND CORONARY ANGIOGRAPHY;  Surgeon: Leonie Man, MD;  Location: [facility];  Service: Cardiovascular;  Laterality: N/A;  . SHOULDER ARTHROSCOPY WITH BICEPSTENOTOMY Right 09/06/2019   Procedure: SHOULDER ARTHROSCOPY WITH BICEPSTENOTOMY;  Surgeon: Leandrew Koyanagi, MD;  Location: [facility];  Service: Orthopedics;  Laterality: Right;  . SHOULDER  ARTHROSCOPY WITH SUBACROMIAL DECOMPRESSION Right 09/06/2019   Procedure: RIGHT SHOULDER ARTHROSCOPY WITH EXTENSIVE DEBRIDEMENT, SUBACROMIAL DECOMPRESSION, BICEPS TENOTOMY;  Surgeon: Leandrew Koyanagi, MD;  Location: [facility];  Service: Orthopedics;  Laterality: Right;  . TENOTOMY ACHILLES TENDON      There were no vitals filed for this visit.   Subjective Assessment - 02/05/20 1349    Subjective Patient reports injury to Rt LE while playing pickleball. He felt a hot pain in the Rt groin and pain in the Rt thigh. He has had significant pain and bruising as well as some swelling in the Rt medial knee. He is continuing to play pickelball but is modifying his play.    Pertinent History Rt Achilles tendon rupture and repair 2007; Rt RCR 09/06/18; cervical fusion 2015; AODM; chronic LBP; "disc problems"    Patient Stated Goals get leg working again    Currently in Pain? Yes    Pain Score 3     Pain Location Leg    Pain Orientation Right    Pain Descriptors / Indicators Aching    Pain Type Acute pain    Pain Radiating Towards thigh posterior    Pain Onset 1 to 4 weeks ago    Pain Frequency Constant    Aggravating Factors  bending knee; bending over to pick something up off the floor; tie shoes; anything that requires him to bend forward  Pain Relieving Factors avoid the activities that irritate              [facility] PT Assessment - 02/05/20 0001      Assessment   Medical Diagnosis Rt hamstring strain     Referring Provider (PT) Dr Lynne Leader     Onset Date/Surgical Date 01/25/20    Hand Dominance Right    Next MD Visit 02/27/20    Prior Therapy yes for neck; shoulder; LB; anle       Precautions   Precautions None      Restrictions   Weight Bearing Restrictions No      Balance Screen   Has the patient fallen in the past 6 months No    Has the patient had a decrease in activity level because of a fear of falling?  No    Is the patient reluctant to leave their home  because of a fear of falling?  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, materials pro - retired ~ 3/20    [place]; yard work; household activities  - now Merchandiser, retail; walking daog ~ 3-4 times/wk x 30 min       Observation/Other Assessments   Observations significant ecchymosis posterior lateral Lt thigh and leg    thigh sleeve elastic impression remained>30 min post removal   Skin Integrity in tact     Focus on Therapeutic Outcomes (FOTO)  51% limitation       Observation/Other Assessments-Edema    Edema --   posterior thigh to knee Rt      Sensation   Additional Comments numbness posterior Rt calf on an intermittent basis       Posture/Postural Control   Posture Comments head forward; sits with wt shifted to the Lt; Rt LE in extension; stands with wt shifted to the Clinch, Rt LE in ER       AROM   Right/Left Hip --   tight bilat end ranges throughout limited hip ext    Right/Left Knee --   0 ext to 120-121 flexion bilat    Lumbar Flexion 70% LBP     Lumbar Extension 20%     Lumbar - Right Side Bend 65% LBP    Lumbar - Left Side Bend 65% LBP     Lumbar - Right Rotation 30%     Lumbar - Left Rotation 30%       Strength   Right Hip Extension 3/5    Left Hip Extension 3+/5    Right Knee Flexion 4+/5    Right Knee Extension 4+/5    Left Knee Flexion 5/5    Left Knee Extension 5/5      Flexibility   Soft Tissue Assessment /Muscle Length --   tight Rt > Lt hip adductors    Hamstrings tight Rt ~ 65 deg; Lt ~ 60 deg     Quadriceps tight Rt 90 deg; Lt 100 deg     ITB tight Rt > Lt     Piriformis tight bilat       Palpation   Palpation comment muscular tightness through the  posterior thigh to knee to calf       Ambulation/Gait   Gait Comments Rt LE externally rotated; limp Rt LE in wt bearing Rt                          [facility] Adult PT Treatment/Exercise - 02/05/20 0001      Knee/Hip Exercises: Stretches   Passive Hamstring Stretch Right;2 reps;30 seconds   supine with strap    Quad Stretch Right;3 reps;30 seconds   prone with strap    Hip Flexor Stretch Limitations needs to add next visit     ITB Stretch Right;2 reps;30 seconds   supine with strap    Piriformis Stretch Right;2 reps;30 seconds   supine travell    Other Knee/Hip Stretches hip adductor stretch supine with strap 30 sec x 2 reps                  PT Education - 02/05/20 1427    Education Details HEP POC    Person(s) Educated Patient    Methods Explanation;Demonstration;Tactile cues;Verbal cues;Handout    Comprehension Verbalized understanding;Returned demonstration;Verbal cues required;Tactile cues required               PT Long Term Goals - 02/05/20 1718      PT LONG TERM GOAL #1   Title Patient to be independent with advanced HEP.    Time 6    Period Weeks    Status New    Target Date 03/18/20      PT LONG TERM GOAL #2   Title Improve LE tissue extensibility demonstrated by increased hamstring flexibility to 70 deg with no pain    Time 6    Period Weeks    Status New    Target Date 03/18/20      PT LONG TERM GOAL #3   Title LE hip extension strength 4/5 to 4+/5    Time 6    Period Weeks    Status New    Target Date 03/18/20      PT LONG TERM GOAL #4   Title Patient reports return to pickleball at level prior to injury 01/25/20    Time 6    Period Weeks    Status New    Target Date 03/18/20      PT LONG TERM GOAL #5   Title Improve FOTO to </= 31% limitation    Time 6    Period Weeks    Status New    Target Date 03/18/20                 Plan - 02/05/20 1514    Clinical Impression Statement Patient presents with Rt hamstring  and adductor strain from twisting jump during pickleball 01/25/20. He has decreased trunk and hip mobiilty and ROM; decreased LE strength; muscular tightness to palpation; abnormal gait pattern; limited functional activities and pain on a daily basis.Patient will beneift from PT to address problems identified.    Stability/Clinical Decision Making Stable/Uncomplicated    Clinical Decision Making Low    Rehab Potential Good    PT Frequency 2x / week    PT Duration 6 weeks    PT Treatment/Interventions ADLs/Self Care Home Management;Aquatic Therapy;Cryotherapy;Electrical Stimulation;Iontophoresis 4mg /ml Dexamethasone;Moist Heat;Ultrasound;Gait training;Stair training;Functional mobility training;Therapeutic activities;Therapeutic exercise;Balance training;Neuromuscular re-education;Patient/family education;Manual techniques;Passive range of motion;Dry needling;Taping;Vasopneumatic Device    PT Next Visit Plan review HEP; add hip flexor stretch in multiple positions; add butterfly stretch; add  HS stretch sitting with hinged hip to protect LBP; trial of taping for edemal and ecchymosis; progress functional strengthening and sports specific exercise as pain, edema decrease and flexibility increases    PT Home Exercise Plan [id number]    Consulted and Agree with Plan of Care Patient           Patient will benefit from skilled therapeutic intervention in order to improve the following deficits and impairments:  Abnormal gait, Decreased range of motion, Increased fascial restricitons, Increased muscle spasms, Pain, Impaired flexibility, Improper body mechanics, Decreased mobility, Decreased strength, Postural dysfunction, Increased edema  Visit Diagnosis: Hamstring strain, left, initial encounter - Plan: PT plan of care cert/re-cert  Other symptoms and signs involving the musculoskeletal system - Plan: PT plan of care cert/re-cert  Weakness generalized - Plan: PT plan of care cert/re-cert  Stiffness of  both hip joints - Plan: PT plan of care cert/re-cert     Problem List Patient Active Problem List   Diagnosis Date Noted  . Myofascial pain syndrome, cervical 07/28/2019  . Lateral epicondylitis of right elbow 07/28/2019  . Nontraumatic incomplete tear of right rotator cuff 07/25/2019  . Tendinopathy of right biceps tendon 07/25/2019  . Erectile dysfunction 07/11/2019  . Mild persistent asthma/cough variant asthma 04/13/2019  . Allergic rhinitis with a possible nonallergic component 04/13/2019  . Acid reflux 04/13/2019  . Cough 04/13/2019  . Hoarseness 04/13/2019  . Cervical radicular pain 01/10/2019  . Ulnar neuropathy at elbow of right upper extremity 01/03/2019  . Acute bilateral low back pain without sciatica 01/03/2019  . Onychomycosis 05/25/2018  . Palpitations 07/06/2017  . CAD (coronary artery disease) 07/05/2017  . Family history of early CAD 07/05/2017  . Abnormal findings on diagnostic imaging of cardiovascular system 06/23/2017  . Low vitamin D level 04/22/2017  . Pain in right ankle and joints of right foot 10/05/2016  . Hemarthrosis of right elbow 02/03/2016  . Small thenar eminence 02/03/2016  . Cervical disc disorder with radiculopathy of cervical region 10/07/2015  . Incomplete rotator cuff tear 09/10/2015  . Nonischemic cardiomyopathy ([place]) 05/15/2015  . Fatigue 05/15/2015  . Dyspnea on exertion 05/15/2015  . Decreased cardiac ejection fraction 04/02/2015  . Strain of latissimus dorsi muscle 01/21/2015  . Shoulder bursitis 10/12/2014  . Stenosis of lateral recess of lumbar spine 10/12/2014  . BPPV (benign paroxysmal positional vertigo) 08/14/2014  . Hyperlipidemia 01/19/2014  . Anemia, unspecified 01/19/2014  . ADD (attention deficit disorder) 01/19/2014  . Type 2 diabetes mellitus with hyperglycemia, without long-term current use of insulin ([place]) 12/22/2013  . Hypogonadism in male 12/22/2013  . Arthritis of hand, left 12/22/2013    Josede Cicero Nilda Simmer PT,  MPH  02/05/2020, 5:30 PM  [facility] [place], [place], [address] Phone: [phone]   Fax:  [phone]  Name: ARSHAN JABS MRN: [id number] Date of Birth: 1956-03-27

## 2020-02-05 NOTE — Patient Instructions (Signed)
Access Code: [email or web address]: [email or web address]: 10/04/2021Prepared by: Myrian Botello HoltExercises  Prone Quadriceps Stretch with Strap - 2 x daily - 7 x weekly - 1 sets - 3 reps - 30 sec hold  Hooklying Hamstring Stretch with Strap - 2 x daily - 7 x weekly - 1 sets - 3 reps - 30 sec hold  Hip Adductors and Hamstring Stretch with Strap - 2 x daily - 7 x weekly - 1 sets - 3 reps - 30 sec hold  Supine Piriformis Stretch with Leg Straight - 2 x daily - 7 x weekly - 1 sets - 3 reps - 30 sec hold  Supine ITB Stretch with Strap - 2 x daily - 7 x weekly - 1 sets - 3 reps - 30 sec hold

## 2020-02-06 ENCOUNTER — Other Ambulatory Visit: Payer: Self-pay | Admitting: Family Medicine

## 2020-02-06 MED FILL — JANUVIA 100 MG TABLET: 100 | 30 days supply | Qty: 30 | Fill #0

## 2020-02-14 ENCOUNTER — Ambulatory Visit: Payer: 59 | Admitting: Physical Therapy

## 2020-02-19 ENCOUNTER — Other Ambulatory Visit: Payer: Self-pay

## 2020-02-19 ENCOUNTER — Encounter: Payer: Self-pay | Admitting: Rehabilitative and Restorative Service Providers"

## 2020-02-19 ENCOUNTER — Ambulatory Visit (INDEPENDENT_AMBULATORY_CARE_PROVIDER_SITE_OTHER): Payer: 59 | Admitting: Rehabilitative and Restorative Service Providers"

## 2020-02-19 DIAGNOSIS — R29898 Other symptoms and signs involving the musculoskeletal system: Secondary | ICD-10-CM

## 2020-02-19 DIAGNOSIS — M25651 Stiffness of right hip, not elsewhere classified: Secondary | ICD-10-CM | POA: Diagnosis not present

## 2020-02-19 DIAGNOSIS — R531 Weakness: Secondary | ICD-10-CM

## 2020-02-19 DIAGNOSIS — M25652 Stiffness of left hip, not elsewhere classified: Secondary | ICD-10-CM | POA: Diagnosis not present

## 2020-02-19 DIAGNOSIS — S76312A Strain of muscle, fascia and tendon of the posterior muscle group at thigh level, left thigh, initial encounter: Secondary | ICD-10-CM

## 2020-02-19 NOTE — Therapy (Signed)
[place], [place], [address] Phone: [phone]   Fax:  [phone]  Physical Therapy Treatment  Patient Details  Name: Reginald Rios MRN: [id number] Date of Birth: 06/30/1955 Referring Provider (PT): Dr Lynne Leader    Encounter Date: 02/19/2020   PT End of Session - 02/19/20 1432    Visit Number 2    Number of Visits 12    Date for PT Re-Evaluation 03/18/20    PT Start Time 2979    PT Stop Time 1513    PT Time Calculation (min) 41 min    Activity Tolerance Patient tolerated treatment well           Past Medical History:  Diagnosis Date  . ADD (attention deficit disorder)   . Anemia   . Arthritis   . CAD (coronary artery disease)    2/19 PCI/DESx1 to Lcx  . Diabetes mellitus without complication ([place])   . Exercise-induced asthma   . Heart murmur   . Hyperlipidemia    family hx of high cholesterol  . Hypogonadism in male   . Low back pain   . Osteoarthritis     Past Surgical History:  Procedure Laterality Date  . CORONARY STENT INTERVENTION N/A 06/23/2017   Procedure: CORONARY STENT INTERVENTION;  Surgeon: Leonie Man, MD;  Location: [facility];  Service: Cardiovascular;  Laterality: N/A;  . INTRAVASCULAR PRESSURE WIRE/FFR STUDY N/A 06/23/2017   Procedure: INTRAVASCULAR PRESSURE WIRE/FFR STUDY;  Surgeon: Leonie Man, MD;  Location: [facility];  Service: Cardiovascular;  Laterality: N/A;  . KNEE ARTHROSCOPY    . LEFT HEART CATH AND CORONARY ANGIOGRAPHY N/A 06/23/2017   Procedure: LEFT HEART CATH AND CORONARY ANGIOGRAPHY;  Surgeon: Leonie Man, MD;  Location: [facility];  Service: Cardiovascular;  Laterality: N/A;  . SHOULDER ARTHROSCOPY WITH BICEPSTENOTOMY Right 09/06/2019   Procedure: SHOULDER ARTHROSCOPY WITH BICEPSTENOTOMY;  Surgeon: Leandrew Koyanagi, MD;  Location: [facility];  Service: Orthopedics;  Laterality: Right;  . SHOULDER  ARTHROSCOPY WITH SUBACROMIAL DECOMPRESSION Right 09/06/2019   Procedure: RIGHT SHOULDER ARTHROSCOPY WITH EXTENSIVE DEBRIDEMENT, SUBACROMIAL DECOMPRESSION, BICEPS TENOTOMY;  Surgeon: Leandrew Koyanagi, MD;  Location: [facility];  Service: Orthopedics;  Laterality: Right;  . TENOTOMY ACHILLES TENDON      There were no vitals filed for this visit.   Subjective Assessment - 02/19/20 1433    Subjective [place] and he was doing well with less HS tightness and came back and things are worse. He stretched some and tried to play pickle ball and had severe pain. Still tight Rt > Lt but Lt is hurting some    Currently in Pain? Yes    Pain Score 7     Pain Location Leg    Pain Orientation Right    Pain Descriptors / Indicators Aching    Pain Type Acute pain    Pain Onset More than a month ago    Pain Frequency Constant                             [facility] Adult PT Treatment/Exercise - 02/19/20 0001      Knee/Hip Exercises: Stretches   Passive Hamstring Stretch Right;2 reps;30 seconds   supine with strap    Passive Hamstring Stretch Limitations seated hip hinge - difficulty with posture 30 sec  x 2 reps each side     Quad Stretch Right;3 reps;30 seconds   prone with strap    Hip Flexor Stretch Right;Left;2 reps;30 seconds    Hip Flexor Stretch Limitations seated     ITB Stretch Right;2 reps;30 seconds   supine with strap    Piriformis Stretch Right;2 reps;30 seconds   supine travell    Other Knee/Hip Stretches hip adductor stretch supine with strap 30 sec x 2 reps      Knee/Hip Exercises: Aerobic   Nustep L5 x 6 min UE 10       Moist Heat Therapy   Number Minutes Moist Heat 10 Minutes    Moist Heat Location --   bilat posterior thighs - HS      Manual Therapy   Manual therapy comments skilled palpation to assess response to DN/manual work     Soft tissue mobilization deep tissue work through Radiographer, therapeutic     Other  Manual Therapy DN            Trigger Point Dry Needling - 02/19/20 0001    Consent Given? Yes    Education Handout Provided Yes    Other Dry Needling bilat     Hamstring Response Palpable increased muscle length;Twitch response elicited                PT Education - 02/19/20 1443    Education Details HEP DN    Person(s) Educated Patient    Methods Explanation;Demonstration;Tactile cues;Verbal cues;Handout    Comprehension Verbalized understanding;Returned demonstration;Verbal cues required;Tactile cues required               PT Long Term Goals - 02/05/20 1718      PT LONG TERM GOAL #1   Title Patient to be independent with advanced HEP.    Time 6    Period Weeks    Status New    Target Date 03/18/20      PT LONG TERM GOAL #2   Title Improve LE tissue extensibility demonstrated by increased hamstring flexibility to 70 deg with no pain    Time 6    Period Weeks    Status New    Target Date 03/18/20      PT LONG TERM GOAL #3   Title LE hip extension strength 4/5 to 4+/5    Time 6    Period Weeks    Status New    Target Date 03/18/20      PT LONG TERM GOAL #4   Title Patient reports return to pickleball at level prior to injury 01/25/20    Time 6    Period Weeks    Status New    Target Date 03/18/20      PT LONG TERM GOAL #5   Title Improve FOTO to </= 31% limitation    Time 6    Period Weeks    Status New    Target Date 03/18/20                 Plan - 02/19/20 1438    Clinical Impression Statement Patient had progressed with improved tissue extensibility and decreased pain but has flared up today with return to pickle ball. He is not consistent with stretching. Patient tolerated DN and manual work well. Encouraged to be consistent with HEP.    Rehab Potential Good    PT Frequency 2x / week    PT Duration 6 weeks    PT Treatment/Interventions ADLs/Self Care  Home Management;Aquatic Therapy;Cryotherapy;Electrical Stimulation;Iontophoresis  4mg /ml Dexamethasone;Moist Heat;Ultrasound;Gait training;Stair training;Functional mobility training;Therapeutic activities;Therapeutic exercise;Balance training;Neuromuscular re-education;Patient/family education;Manual techniques;Passive range of motion;Dry needling;Taping;Vasopneumatic Device    PT Next Visit Plan review HEP; add hip flexor stretch in multiple positions; add butterfly stretch; add HS stretch sitting with hinged hip to protect LBP; progress functional strengthening and sports specific exercise as pain, edema decrease and flexibility increases    PT Home Exercise Plan [email or web address]    Consulted and Agree with Plan of Care Patient           Patient will benefit from skilled therapeutic intervention in order to improve the following deficits and impairments:     Visit Diagnosis: Hamstring strain, left, initial encounter  Other symptoms and signs involving the musculoskeletal system  Weakness generalized  Stiffness of both hip joints     Problem List Patient Active Problem List   Diagnosis Date Noted  . Myofascial pain syndrome, cervical 07/28/2019  . Lateral epicondylitis of right elbow 07/28/2019  . Nontraumatic incomplete tear of right rotator cuff 07/25/2019  . Tendinopathy of right biceps tendon 07/25/2019  . Erectile dysfunction 07/11/2019  . Mild persistent asthma/cough variant asthma 04/13/2019  . Allergic rhinitis with a possible nonallergic component 04/13/2019  . Acid reflux 04/13/2019  . Cough 04/13/2019  . Hoarseness 04/13/2019  . Cervical radicular pain 01/10/2019  . Ulnar neuropathy at elbow of right upper extremity 01/03/2019  . Acute bilateral low back pain without sciatica 01/03/2019  . Onychomycosis 05/25/2018  . Palpitations 07/06/2017  . CAD (coronary artery disease) 07/05/2017  . Family history of early CAD 07/05/2017  . Abnormal findings on diagnostic imaging of cardiovascular system 06/23/2017  . Low vitamin D level 04/22/2017  . Pain in  right ankle and joints of right foot 10/05/2016  . Hemarthrosis of right elbow 02/03/2016  . Small thenar eminence 02/03/2016  . Cervical disc disorder with radiculopathy of cervical region 10/07/2015  . Incomplete rotator cuff tear 09/10/2015  . Nonischemic cardiomyopathy ([place]) 05/15/2015  . Fatigue 05/15/2015  . Dyspnea on exertion 05/15/2015  . Decreased cardiac ejection fraction 04/02/2015  . Strain of latissimus dorsi muscle 01/21/2015  . Shoulder bursitis 10/12/2014  . Stenosis of lateral recess of lumbar spine 10/12/2014  . BPPV (benign paroxysmal positional vertigo) 08/14/2014  . Hyperlipidemia 01/19/2014  . Anemia, unspecified 01/19/2014  . ADD (attention deficit disorder) 01/19/2014  . Type 2 diabetes mellitus with hyperglycemia, without long-term current use of insulin ([place]) 12/22/2013  . Hypogonadism in male 12/22/2013  . Arthritis of hand, left 12/22/2013    Allure Greaser Nilda Simmer PT, MPH  02/19/2020, 3:20 PM  [facility] [place], [place], [address] Phone: [phone]   Fax:  [phone]  Name: Reginald Rios MRN: [id number] Date of Birth: [birth year]

## 2020-02-19 NOTE — Patient Instructions (Addendum)
  Trigger Point Dry Needling  . What is Trigger Point Dry Needling (DN)? o DN is a physical therapy technique used to treat muscle pain and dysfunction. Specifically, DN helps deactivate muscle trigger points (muscle knots).  o A thin filiform needle is used to penetrate the skin and stimulate the underlying trigger point. The goal is for a local twitch response (LTR) to occur and for the trigger point to relax. No medication of any kind is injected during the procedure.   . What Does Trigger Point Dry Needling Feel Like?  o The procedure feels different for each individual patient. Some patients report that they do not actually feel the needle enter the skin and overall the process is not painful. Very mild bleeding may occur. However, many patients feel a deep cramping in the muscle in which the needle was inserted. This is the local twitch response.   Marland Kitchen How Will I feel after the treatment? o Soreness is normal, and the onset of soreness may not occur for a few hours. Typically this soreness does not last longer than two days.  o Bruising is uncommon, however; ice can be used to decrease any possible bruising.  o In rare cases feeling tired or nauseous after the treatment is normal. In addition, your symptoms may get worse before they get better, this period will typically not last longer than 24 hours.   . What Can I do After My Treatment? o Increase your hydration by drinking more water for the next 24 hours. o You may place ice or heat on the areas treated that have become sore, however, do not use heat on inflamed or bruised areas. Heat often brings more relief post needling. o You can continue your regular activities, but vigorous activity is not recommended initially after the treatment for 24 hours. o DN is best combined with other physical therapy such as strengthening, stretching, and other therapies.   Access Code: [email or web address]: [email or web address]:  10/18/2021Prepared by: Janille Draughon HoltExercises  Prone Quadriceps Stretch with Strap - 2 x daily - 7 x weekly - 1 sets - 3 reps - 30 sec hold  Hooklying Hamstring Stretch with Strap - 2 x daily - 7 x weekly - 1 sets - 3 reps - 30 sec hold  Hip Adductors and Hamstring Stretch with Strap - 2 x daily - 7 x weekly - 1 sets - 3 reps - 30 sec hold  Supine Piriformis Stretch with Leg Straight - 2 x daily - 7 x weekly - 1 sets - 3 reps - 30 sec hold  Supine ITB Stretch with Strap - 2 x daily - 7 x weekly - 1 sets - 3 reps - 30 sec hold  Seated Hamstring Stretch - 2 x daily - 7 x weekly - 1 sets - 3 reps - 30 sec hold  Gastroc Stretch on Wall - 2 x daily - 7 x weekly - 1 sets - 3 reps - 30 sec hold

## 2020-02-20 MED FILL — TESTOSTERONE 50 MG/5GM (1%): 50 MG/5GM | 30 days supply | Qty: 300 | Fill #3

## 2020-02-20 MED FILL — METHYLPHENIDATE HCL 20 MG T: 20 | 30 days supply | Qty: 60 | Fill #0

## 2020-02-22 ENCOUNTER — Other Ambulatory Visit: Payer: Self-pay

## 2020-02-22 ENCOUNTER — Ambulatory Visit (INDEPENDENT_AMBULATORY_CARE_PROVIDER_SITE_OTHER): Payer: 59 | Admitting: Physical Therapy

## 2020-02-22 DIAGNOSIS — S76312A Strain of muscle, fascia and tendon of the posterior muscle group at thigh level, left thigh, initial encounter: Secondary | ICD-10-CM

## 2020-02-22 DIAGNOSIS — R531 Weakness: Secondary | ICD-10-CM | POA: Diagnosis not present

## 2020-02-22 DIAGNOSIS — R29898 Other symptoms and signs involving the musculoskeletal system: Secondary | ICD-10-CM

## 2020-02-22 NOTE — Therapy (Signed)
[place], [place], [address] Phone: [phone]   Fax:  [phone]  Physical Therapy Treatment  Patient Details  Name: Reginald Rios MRN: [id number] Date of Birth: 24-May-1955 Referring Provider (PT): Dr Lynne Leader    Encounter Date: 02/22/2020   PT End of Session - 02/22/20 1451    Visit Number 3    Number of Visits 12    Date for PT Re-Evaluation 03/18/20    PT Start Time 3382    PT Stop Time 1535    PT Time Calculation (min) 48 min    Activity Tolerance Patient tolerated treatment well    Behavior During Therapy [facility] for tasks assessed/performed           Past Medical History:  Diagnosis Date  . ADD (attention deficit disorder)   . Anemia   . Arthritis   . CAD (coronary artery disease)    2/19 PCI/DESx1 to Lcx  . Diabetes mellitus without complication ([place])   . Exercise-induced asthma   . Heart murmur   . Hyperlipidemia    family hx of high cholesterol  . Hypogonadism in male   . Low back pain   . Osteoarthritis     Past Surgical History:  Procedure Laterality Date  . CORONARY STENT INTERVENTION N/A 06/23/2017   Procedure: CORONARY STENT INTERVENTION;  Surgeon: Leonie Man, MD;  Location: [place] [facility];  Service: Cardiovascular;  Laterality: N/A;  . INTRAVASCULAR PRESSURE WIRE/FFR STUDY N/A 06/23/2017   Procedure: INTRAVASCULAR PRESSURE WIRE/FFR STUDY;  Surgeon: Leonie Man, MD;  Location: [place] [facility];  Service: Cardiovascular;  Laterality: N/A;  . KNEE ARTHROSCOPY    . LEFT HEART CATH AND CORONARY ANGIOGRAPHY N/A 06/23/2017   Procedure: LEFT HEART CATH AND CORONARY ANGIOGRAPHY;  Surgeon: Leonie Man, MD;  Location: [place] [facility];  Service: Cardiovascular;  Laterality: N/A;  . SHOULDER ARTHROSCOPY WITH BICEPSTENOTOMY Right 09/06/2019   Procedure: SHOULDER ARTHROSCOPY WITH BICEPSTENOTOMY;  Surgeon: Leandrew Koyanagi, MD;  Location: [place];  Service: Orthopedics;  Laterality: Right;  . SHOULDER ARTHROSCOPY WITH SUBACROMIAL DECOMPRESSION Right 09/06/2019   Procedure: RIGHT SHOULDER ARTHROSCOPY WITH EXTENSIVE DEBRIDEMENT, SUBACROMIAL DECOMPRESSION, BICEPS TENOTOMY;  Surgeon: Leandrew Koyanagi, MD;  Location: [place];  Service: Orthopedics;  Laterality: Right;  . TENOTOMY ACHILLES TENDON      There were no vitals filed for this visit.   Subjective Assessment - 02/22/20 1451    Subjective Pt reports he is feeling some better, however his Rt lowback/hip has been hurting now.  He is wearing sleeve on high hamstring when playing pickleball and plans to play tonight at 5:30.    Pertinent History Rt Achilles tendon rupture and repair 2007; Rt RCR 09/06/18; cervical fusion 2015; AODM; chronic LBP; "disc problems"    Patient Stated Goals get leg working again    Currently in Pain? Yes    Pain Score 6     Pain Location Hip    Pain Orientation Right    Pain Descriptors / Indicators Sore    Pain Radiating Towards posterior knee    Aggravating Factors  bending over    Pain Relieving Factors avoid activities that irritate              [facility] PT Assessment - 02/22/20 0001      Assessment   Medical Diagnosis Rt hamstring strain     Referring Provider (PT) Dr Lynne Leader  Onset Date/Surgical Date 01/25/20    Hand Dominance Right    Next MD Visit 02/27/20    Prior Therapy yes for neck; shoulder; LB; anle       AROM   Lumbar Flexion 85%    with Rt LBP   Lumbar Extension 50%     Lumbar - Right Side Bend 65% LBP    Lumbar - Left Side Bend 65% LBP     Lumbar - Right Rotation 80%    Lumbar - Left Rotation 80%      Strength   Right Hip Extension 3+/5    Left Hip Extension 3+/5           [facility] Adult PT Treatment/Exercise - 02/22/20 0001      Self-Care   Self-Care Other Self-Care Comments    Other Self-Care Comments  pt educated on self massage with roller stick in seated position for reduction of fascial  tightness; pt returned demo with cues.       Knee/Hip Exercises: Stretches   Passive Hamstring Stretch Right;Left;2 reps;30 seconds    Passive Hamstring Stretch Limitations seated with straight back and hip hinge     Hip Flexor Stretch Right;Left;2 reps;30 seconds    Hip Flexor Stretch Limitations seated; cues for form    Piriformis Stretch Right;Left;2 reps;20 seconds    Gastroc Stretch Right;Left;2 reps;30 seconds   with hand on wall overhead   Other Knee/Hip Stretches LTR with arms in Y x 20 sec rocking back and forth in pain free range    Other Knee/Hip Stretches standing trunk ext x 3 reps of 5 sec       Knee/Hip Exercises: Aerobic   Recumbent Bike L2-3: 5.5 min for warm up.       Knee/Hip Exercises: Standing   Other Standing Knee Exercises TA engagement x 5 sec x 3 reps, with tactile cues.       Knee/Hip Exercises: Supine   Bridges Strengthening;Both;1 set;10 reps   with arm press, core engaged     Manual Therapy   Soft tissue mobilization STM through Rt glute med, Rt hamstrings including pin and stretch.                        PT Long Term Goals - 02/05/20 1718      PT LONG TERM GOAL #1   Title Patient to be independent with advanced HEP.    Time 6    Period Weeks    Status New    Target Date 03/18/20      PT LONG TERM GOAL #2   Title Improve LE tissue extensibility demonstrated by increased hamstring flexibility to 70 deg with no pain    Time 6    Period Weeks    Status New    Target Date 03/18/20      PT LONG TERM GOAL #3   Title LE hip extension strength 4/5 to 4+/5    Time 6    Period Weeks    Status New    Target Date 03/18/20      PT LONG TERM GOAL #4   Title Patient reports return to pickleball at level prior to injury 01/25/20    Time 6    Period Weeks    Status New    Target Date 03/18/20      PT LONG TERM GOAL #5   Title Improve FOTO to </= 31% limitation    Time 6    Period  Weeks    Status New    Target Date 03/18/20                  Plan - 02/22/20 1540    Clinical Impression Statement Time spent educating pt on importance of easing back into sport, consistent stretching, and engaging core with bending activities. Lumbar ROM has improved, but is still limited in sidebending and ext. Pt tolerated exercises well, without increase in pain.  Pt declined modalities; will ice at home.  Issued sample of biofreeze/rock sauce.  Progressing towards goals.    Rehab Potential Good    PT Frequency 2x / week    PT Duration 6 weeks    PT Treatment/Interventions ADLs/Self Care Home Management;Aquatic Therapy;Cryotherapy;Electrical Stimulation;Iontophoresis 4mg /ml Dexamethasone;Moist Heat;Ultrasound;Gait training;Stair training;Functional mobility training;Therapeutic activities;Therapeutic exercise;Balance training;Neuromuscular re-education;Patient/family education;Manual techniques;Passive range of motion;Dry needling;Taping;Vasopneumatic Device    PT Next Visit Plan Add core engagement with functional acitivities/ exercises; progress functional strengthening and sports specific exercise as pain, edema decrease and flexibility increases.FOTO/ MD NOTE - MD appt 10/26   PT Home Exercise Plan [email or web address]    Consulted and Agree with Plan of Care Patient           Patient will benefit from skilled therapeutic intervention in order to improve the following deficits and impairments:  Abnormal gait, Decreased range of motion, Increased fascial restricitons, Increased muscle spasms, Pain, Impaired flexibility, Improper body mechanics, Decreased mobility, Decreased strength, Postural dysfunction, Increased edema  Visit Diagnosis: Hamstring strain, left, initial encounter  Other symptoms and signs involving the musculoskeletal system  Weakness generalized     Problem List Patient Active Problem List   Diagnosis Date Noted  . Myofascial pain syndrome, cervical 07/28/2019  . Lateral epicondylitis of right elbow 07/28/2019  .  Nontraumatic incomplete tear of right rotator cuff 07/25/2019  . Tendinopathy of right biceps tendon 07/25/2019  . Erectile dysfunction 07/11/2019  . Mild persistent asthma/cough variant asthma 04/13/2019  . Allergic rhinitis with a possible nonallergic component 04/13/2019  . Acid reflux 04/13/2019  . Cough 04/13/2019  . Hoarseness 04/13/2019  . Cervical radicular pain 01/10/2019  . Ulnar neuropathy at elbow of right upper extremity 01/03/2019  . Acute bilateral low back pain without sciatica 01/03/2019  . Onychomycosis 05/25/2018  . Palpitations 07/06/2017  . CAD (coronary artery disease) 07/05/2017  . Family history of early CAD 07/05/2017  . Abnormal findings on diagnostic imaging of cardiovascular system 06/23/2017  . Low vitamin D level 04/22/2017  . Pain in right ankle and joints of right foot 10/05/2016  . Hemarthrosis of right elbow 02/03/2016  . Small thenar eminence 02/03/2016  . Cervical disc disorder with radiculopathy of cervical region 10/07/2015  . Incomplete rotator cuff tear 09/10/2015  . Nonischemic cardiomyopathy ([place]) 05/15/2015  . Fatigue 05/15/2015  . Dyspnea on exertion 05/15/2015  . Decreased cardiac ejection fraction 04/02/2015  . Strain of latissimus dorsi muscle 01/21/2015  . Shoulder bursitis 10/12/2014  . Stenosis of lateral recess of lumbar spine 10/12/2014  . BPPV (benign paroxysmal positional vertigo) 08/14/2014  . Hyperlipidemia 01/19/2014  . Anemia, unspecified 01/19/2014  . ADD (attention deficit disorder) 01/19/2014  . Type 2 diabetes mellitus with hyperglycemia, without long-term current use of insulin ([place]) 12/22/2013  . Hypogonadism in male 12/22/2013  . Arthritis of hand, left 12/22/2013   Reginald Rios, Reginald Rios 02/22/20 3:55 PM  [facility] [place], [place], [address] Phone: [phone]   Fax:  [phone]  Name: Reginald Zappone  Rios MRN: [id number] Date of  Birth: [birth year]

## 2020-02-26 ENCOUNTER — Other Ambulatory Visit: Payer: Self-pay

## 2020-02-26 ENCOUNTER — Ambulatory Visit (INDEPENDENT_AMBULATORY_CARE_PROVIDER_SITE_OTHER): Payer: 59 | Admitting: Rehabilitative and Restorative Service Providers"

## 2020-02-26 ENCOUNTER — Encounter: Payer: Self-pay | Admitting: Rehabilitative and Restorative Service Providers"

## 2020-02-26 DIAGNOSIS — R531 Weakness: Secondary | ICD-10-CM

## 2020-02-26 DIAGNOSIS — S76312A Strain of muscle, fascia and tendon of the posterior muscle group at thigh level, left thigh, initial encounter: Secondary | ICD-10-CM | POA: Diagnosis not present

## 2020-02-26 DIAGNOSIS — M25652 Stiffness of left hip, not elsewhere classified: Secondary | ICD-10-CM

## 2020-02-26 DIAGNOSIS — M25651 Stiffness of right hip, not elsewhere classified: Secondary | ICD-10-CM

## 2020-02-26 DIAGNOSIS — R29898 Other symptoms and signs involving the musculoskeletal system: Secondary | ICD-10-CM | POA: Diagnosis not present

## 2020-02-26 NOTE — Therapy (Signed)
[place], [place], [address] Phone: [phone]   Fax:  [phone]  Physical Therapy Treatment  Patient Details  Name: Reginald Rios MRN: [id number] Date of Birth: 12-15-1955 Referring Provider (PT): Dr Lynne Leader    Encounter Date: 02/26/2020   PT End of Session - 02/26/20 1442    Visit Number 4    Number of Visits 12    Date for PT Re-Evaluation 03/18/20    PT Start Time 7619    PT Stop Time 1524    PT Time Calculation (min) 48 min    Activity Tolerance Patient tolerated treatment well           Past Medical History:  Diagnosis Date  . ADD (attention deficit disorder)   . Anemia   . Arthritis   . CAD (coronary artery disease)    2/19 PCI/DESx1 to Lcx  . Diabetes mellitus without complication ([place])   . Exercise-induced asthma   . Heart murmur   . Hyperlipidemia    family hx of high cholesterol  . Hypogonadism in male   . Low back pain   . Osteoarthritis     Past Surgical History:  Procedure Laterality Date  . CORONARY STENT INTERVENTION N/A 06/23/2017   Procedure: CORONARY STENT INTERVENTION;  Surgeon: Leonie Man, MD;  Location: [place] [facility];  Service: Cardiovascular;  Laterality: N/A;  . INTRAVASCULAR PRESSURE WIRE/FFR STUDY N/A 06/23/2017   Procedure: INTRAVASCULAR PRESSURE WIRE/FFR STUDY;  Surgeon: Leonie Man, MD;  Location: [place] [facility];  Service: Cardiovascular;  Laterality: N/A;  . KNEE ARTHROSCOPY    . LEFT HEART CATH AND CORONARY ANGIOGRAPHY N/A 06/23/2017   Procedure: LEFT HEART CATH AND CORONARY ANGIOGRAPHY;  Surgeon: Leonie Man, MD;  Location: [place] [facility];  Service: Cardiovascular;  Laterality: N/A;  . SHOULDER ARTHROSCOPY WITH BICEPSTENOTOMY Right 09/06/2019   Procedure: SHOULDER ARTHROSCOPY WITH BICEPSTENOTOMY;  Surgeon: Leandrew Koyanagi, MD;  Location: [place];  Service: Orthopedics;  Laterality: Right;  . SHOULDER  ARTHROSCOPY WITH SUBACROMIAL DECOMPRESSION Right 09/06/2019   Procedure: RIGHT SHOULDER ARTHROSCOPY WITH EXTENSIVE DEBRIDEMENT, SUBACROMIAL DECOMPRESSION, BICEPS TENOTOMY;  Surgeon: Leandrew Koyanagi, MD;  Location: [place];  Service: Orthopedics;  Laterality: Right;  . TENOTOMY ACHILLES TENDON      There were no vitals filed for this visit.   Subjective Assessment - 02/26/20 1446    Subjective Rt hamstrings are feeling better. Still having some pain in the Rt LB. Having some tightness in the Lt hamstring. Continues to play pickleball. Has some difficulty stepping up high step into pickle ball court. Has an appt to try [place] Thursday    Currently in Pain? Yes    Pain Score 4     Pain Location Hip    Pain Orientation Right    Pain Descriptors / Indicators Sore    Pain Type Acute pain    Pain Radiating Towards posterior knee    Pain Onset More than a month ago    Pain Frequency Constant   low back pain constant - leg pain intermittent                            [facility] Adult PT Treatment/Exercise - 02/26/20 0001      Self-Care   Self-Care Other Self-Care Comments    Other Self-Care Comments  continued education re- back care; proper transitional movements  Knee/Hip Exercises: Stretches   Passive Hamstring Stretch Right;Left;2 reps;30 seconds   supine opposite knee bent    Passive Hamstring Stretch Limitations seated with straight back and hip hinge -     ITB Stretch Right;Left;2 reps;30 seconds   supine with strap    Piriformis Stretch Right;Left;2 reps;20 seconds   supine travell    Other Knee/Hip Stretches double knee to chest knees apart 30 sec x 3 reps     Other Knee/Hip Stretches sitting lateral trunk flexion 30 sec x 3 reps reaching to the Lt over head       Knee/Hip Exercises: Aerobic   Nustep L5 x 6 min UE 10       Moist Heat Therapy   Number Minutes Moist Heat 10 Minutes    Moist Heat Location Lumbar Spine      Manual Therapy    Manual Therapy --   pt prone    Joint Mobilization lumbar PA mobs through lumbar spine Grade II/III     Soft tissue mobilization working through the lumbar paraspinals and into gluts                   PT Education - 02/26/20 1502    Education Details HEP    Person(s) Educated Patient    Methods Explanation;Demonstration;Tactile cues;Verbal cues    Comprehension Verbalized understanding;Returned demonstration;Verbal cues required;Tactile cues required               PT Long Term Goals - 02/05/20 1718      PT LONG TERM GOAL #1   Title Patient to be independent with advanced HEP.    Time 6    Period Weeks    Status New    Target Date 03/18/20      PT LONG TERM GOAL #2   Title Improve LE tissue extensibility demonstrated by increased hamstring flexibility to 70 deg with no pain    Time 6    Period Weeks    Status New    Target Date 03/18/20      PT LONG TERM GOAL #3   Title LE hip extension strength 4/5 to 4+/5    Time 6    Period Weeks    Status New    Target Date 03/18/20      PT LONG TERM GOAL #4   Title Patient reports return to pickleball at level prior to injury 01/25/20    Time 6    Period Weeks    Status New    Target Date 03/18/20      PT LONG TERM GOAL #5   Title Improve FOTO to </= 31% limitation    Time 6    Period Weeks    Status New    Target Date 03/18/20                 Plan - 02/26/20 1443    Clinical Impression Statement Rt leg is doing better. Now having some discomfort in the Rt LB and tightness in the Lt HS. Added stretching for LE's and lumbar spine.Corrected technique for stretches. Positive response in LBP to stretching and manual work today. May benefit from trial of DN to Rt lumbar spine musculature.    Rehab Potential Good    PT Frequency 2x / week    PT Duration 6 weeks    PT Treatment/Interventions ADLs/Self Care Home Management;Aquatic Therapy;Cryotherapy;Electrical Stimulation;Iontophoresis 4mg /ml  Dexamethasone;Moist Heat;Ultrasound;Gait training;Stair training;Functional mobility training;Therapeutic activities;Therapeutic exercise;Balance training;Neuromuscular re-education;Patient/family education;Manual techniques;Passive range of motion;Dry  needling;Taping;Vasopneumatic Device    PT Next Visit Plan Add core engagement with functional acitivities/ exercises; progress functional strengthening and sports specific exercise as pain, edema decrease and flexibility increases; consider trial of DN to Rt lumbar/Rt hip    PT Home Exercise Plan [email or web address]    Consulted and Agree with Plan of Care Patient           Patient will benefit from skilled therapeutic intervention in order to improve the following deficits and impairments:     Visit Diagnosis: Hamstring strain, left, initial encounter  Other symptoms and signs involving the musculoskeletal system  Weakness generalized  Stiffness of both hip joints     Problem List Patient Active Problem List   Diagnosis Date Noted  . Myofascial pain syndrome, cervical 07/28/2019  . Lateral epicondylitis of right elbow 07/28/2019  . Nontraumatic incomplete tear of right rotator cuff 07/25/2019  . Tendinopathy of right biceps tendon 07/25/2019  . Erectile dysfunction 07/11/2019  . Mild persistent asthma/cough variant asthma 04/13/2019  . Allergic rhinitis with a possible nonallergic component 04/13/2019  . Acid reflux 04/13/2019  . Cough 04/13/2019  . Hoarseness 04/13/2019  . Cervical radicular pain 01/10/2019  . Ulnar neuropathy at elbow of right upper extremity 01/03/2019  . Acute bilateral low back pain without sciatica 01/03/2019  . Onychomycosis 05/25/2018  . Palpitations 07/06/2017  . CAD (coronary artery disease) 07/05/2017  . Family history of early CAD 07/05/2017  . Abnormal findings on diagnostic imaging of cardiovascular system 06/23/2017  . Low vitamin D level 04/22/2017  . Pain in right ankle and joints of right foot  10/05/2016  . Hemarthrosis of right elbow 02/03/2016  . Small thenar eminence 02/03/2016  . Cervical disc disorder with radiculopathy of cervical region 10/07/2015  . Incomplete rotator cuff tear 09/10/2015  . Nonischemic cardiomyopathy ([place]) 05/15/2015  . Fatigue 05/15/2015  . Dyspnea on exertion 05/15/2015  . Decreased cardiac ejection fraction 04/02/2015  . Strain of latissimus dorsi muscle 01/21/2015  . Shoulder bursitis 10/12/2014  . Stenosis of lateral recess of lumbar spine 10/12/2014  . BPPV (benign paroxysmal positional vertigo) 08/14/2014  . Hyperlipidemia 01/19/2014  . Anemia, unspecified 01/19/2014  . ADD (attention deficit disorder) 01/19/2014  . Type 2 diabetes mellitus with hyperglycemia, without long-term current use of insulin ([place]) 12/22/2013  . Hypogonadism in male 12/22/2013  . Arthritis of hand, left 12/22/2013    Nial Hawe Nilda Simmer PT, MPH  02/26/2020, 3:23 PM  [facility] [place], [place], [address] Phone: [phone]   Fax:  [phone]  Name: JERIAN MORAIS MRN: [id number] Date of Birth: [birth year]

## 2020-02-26 NOTE — Patient Instructions (Addendum)
Piriformis Stretch    Lying on back, pull right knee toward opposite shoulder. Hold __30 __ seconds. Repeat _3___ times. Do __2-3__ sessions per day.   Access Code: [email or web address]: [email or web address]: 10/25/2021Prepared by: Toddy Boyd HoltExercises  Prone Quadriceps Stretch with Strap - 2 x daily - 7 x weekly - 1 sets - 3 reps - 30 sec hold  Hooklying Hamstring Stretch with Strap - 2 x daily - 7 x weekly - 1 sets - 3 reps - 30 sec hold  Hip Adductors and Hamstring Stretch with Strap - 2 x daily - 7 x weekly - 1 sets - 3 reps - 30 sec hold  Supine Piriformis Stretch with Leg Straight - 2 x daily - 7 x weekly - 1 sets - 3 reps - 30 sec hold  Supine ITB Stretch with Strap - 2 x daily - 7 x weekly - 1 sets - 3 reps - 30 sec hold  Seated Hamstring Stretch - 2 x daily - 7 x weekly - 1 sets - 3 reps - 30 sec hold  Gastroc Stretch on Wall - 2 x daily - 7 x weekly - 1 sets - 3 reps - 30 sec hold  Cat-Camel - 2 x daily - 7 x weekly - 1 sets - 5-10 reps - 3-5 sec hold  Supine Double Knee to Chest - 2 x daily - 7 x weekly - 1 sets - 3 reps - 30 sec hold  Seated Sidebending - 2 x daily - 7 x weekly - 1 sets - 3 reps - 20-30 sec hold

## 2020-02-27 ENCOUNTER — Ambulatory Visit: Payer: Self-pay

## 2020-02-27 ENCOUNTER — Encounter: Payer: Self-pay | Admitting: Family Medicine

## 2020-02-27 ENCOUNTER — Other Ambulatory Visit: Payer: Self-pay | Admitting: Family Medicine

## 2020-02-27 ENCOUNTER — Ambulatory Visit (INDEPENDENT_AMBULATORY_CARE_PROVIDER_SITE_OTHER): Payer: 59

## 2020-02-27 ENCOUNTER — Ambulatory Visit: Payer: 59 | Admitting: Family Medicine

## 2020-02-27 VITALS — BP 122/76 | HR 82 | Ht 70.0 in | Wt 191.6 lb

## 2020-02-27 DIAGNOSIS — M542 Cervicalgia: Secondary | ICD-10-CM | POA: Diagnosis not present

## 2020-02-27 DIAGNOSIS — S76311A Strain of muscle, fascia and tendon of the posterior muscle group at thigh level, right thigh, initial encounter: Secondary | ICD-10-CM

## 2020-02-27 DIAGNOSIS — M791 Myalgia, unspecified site: Secondary | ICD-10-CM

## 2020-02-27 DIAGNOSIS — M79601 Pain in right arm: Secondary | ICD-10-CM

## 2020-02-27 DIAGNOSIS — R202 Paresthesia of skin: Secondary | ICD-10-CM

## 2020-02-27 DIAGNOSIS — R5383 Other fatigue: Secondary | ICD-10-CM

## 2020-02-27 DIAGNOSIS — M79652 Pain in left thigh: Secondary | ICD-10-CM | POA: Diagnosis not present

## 2020-02-27 DIAGNOSIS — M5412 Radiculopathy, cervical region: Secondary | ICD-10-CM | POA: Diagnosis not present

## 2020-02-27 DIAGNOSIS — I2510 Atherosclerotic heart disease of native coronary artery without angina pectoris: Secondary | ICD-10-CM

## 2020-02-27 MED FILL — EZETIMIBE 10 MG TABS: 10 | 90 days supply | Qty: 90 | Fill #1

## 2020-02-27 MED FILL — CLOPIDOGREL 75 MG TABLET: 75 | 90 days supply | Qty: 90 | Fill #1

## 2020-02-27 MED FILL — PREGABALIN 75 MG CAPS: 75 | 30 days supply | Qty: 60 | Fill #0

## 2020-02-27 NOTE — Patient Instructions (Addendum)
Thank you for coming in today.  Plan to continue PT for the back and legs.  Please get an Xray today before you leave  OK to continue playing if feeling ok.   OK to try stopping the pravastatin for a few weeks and restring it. Pay attention to how you are feeling.   Recheck in 4 weeks.   Get labs

## 2020-02-27 NOTE — Progress Notes (Signed)
I, Reginald Rios, LAT, ATC, am serving as scribe for Dr. Lynne Leader.  Reginald Rios is a 64 y.o. male who presents to [place] [facility] today for f/u of R hamstring / knee pain.  He was last seen by Dr. Georgina Snell on 01/30/20 after injuring himself while playing pickleball.  He was referred to PT of which he has completed 4 visits and was advised to use a thigh compression sleeve.  Since his last visit w/ Dr. Georgina Snell, pt reports that he is now having pain in his B h/s.  His R h/s is feeling better but now his L h/s is tight and cramping.  He is also having increased R-sided LBP.  He is also having some new L forearm pain and numbness that he feels is associated w/ having to use his L UE so much more to push himself up out of a chair.   Pertinent review of systems: No fevers or chills  Relevant historical information: CAD, diabetes, history cervical fusion   Exam:  BP 122/76 (BP Location: Right Arm, Patient Position: Sitting, Cuff Size: Normal)   Pulse 82   Ht 5\' 10"  (1.778 m)   Wt 191 lb 9.6 oz (86.9 kg)   SpO2 97%   BMI 27.49 kg/m  General: Well Developed, well nourished, and in no acute distress.   MSK: C-spine normal-appearing Nontender midline. Decreased cervical motion. Negative Spurling's test. Upper extremity strength reflexes and sensation equal and intact bilateral upper extremities. Negative Tinel's cubital tunnel and wrist left side.  L-spine nontender midline.  Tender palpation lumbar paraspinal musculature bilaterally. Normal lumbar motion. Hips bilaterally nontender normal motion. Strength intact. Negative H test bilaterally.    Lab and Radiology Results X-ray images cervical-spine obtained today personally and independently interpreted. Anterior fusion hardware at C4-C5 and C5-6 .  Significant DDD at C6-C7.  No fracture visible. Await formal radiology interpretation.  Assessment and Plan: 64 y.o. male with bilateral low back pain and  bilateral posterior thigh pain.  Patient having secondary muscle dysfunction arising from initial hamstring strain.  This should continue to improve with physical therapy.  Plan to continue PT.  We will proceed with limited rheumatologic work-up to make sure that there is note some systemic underlying issue.  Labs listed below. Additionally some of this could be statin myopathy.  He is on low-dose pravastatin which makes this less likely.  Plan to discontinue statin for 2 weeks see how he feels and restart it and see how he feels.  Recheck in a month.  Left arm paresthesia.  Etiology is a bit unclear.  Symptoms mostly located to the first 3 digits dorsal and palmar side of the hand.  This does not correspond to any one dermatomal pattern.  Most likely to be C6 and C7 cervical radiculopathy.  Abnormal presentation of carpal tunnel syndrome is a possibility.  X-ray obtained today does show significant abnormalities in area that would make sense for this issue.  Plan for physical therapy and reassess in a month.   PDMP not reviewed this encounter. Orders Placed This Encounter  Procedures  . [place] LIMITED JOINT SPACE STRUCTURES LOW LEFT(NO LINKED CHARGES)    Order Specific Question:   Reason for Exam (SYMPTOM  OR DIAGNOSIS REQUIRED)    Answer:   L hamstring pain    Order Specific Question:   Preferred imaging location?    Answer:   [facility]  . DG Cervical Spine Complete  Standing Status:   Future    Number of Occurrences:   1    Standing Expiration Date:   02/26/2021    Order Specific Question:   Reason for Exam (SYMPTOM  OR DIAGNOSIS REQUIRED)    Answer:   eval left cervical rad    Order Specific Question:   Preferred imaging location?    Answer:   [facility]  . CBC with Differential/Platelet    Standing Status:   Future    Number of Occurrences:   1    Standing Expiration Date:   02/26/2021  . TSH    Standing Status:   Future    Number of Occurrences:    1    Standing Expiration Date:   02/26/2021  . CK    Standing Status:   Future    Number of Occurrences:   1    Standing Expiration Date:   02/26/2021  . C-reactive protein    Standing Status:   Future    Number of Occurrences:   1    Standing Expiration Date:   02/26/2021  . Sedimentation rate    Standing Status:   Future    Number of Occurrences:   1    Standing Expiration Date:   02/26/2021  . ANA    Standing Status:   Future    Number of Occurrences:   1    Standing Expiration Date:   02/26/2021  . Rheumatoid factor    Standing Status:   Future    Number of Occurrences:   1    Standing Expiration Date:   02/26/2021  . Comprehensive metabolic panel    Standing Status:   Future    Number of Occurrences:   1    Standing Expiration Date:   02/26/2021    Order Specific Question:   Release to patient    Answer:   Immediate   No orders of the defined types were placed in this encounter.    Discussed warning signs or symptoms. Please see discharge instructions. Patient expresses understanding.   The above documentation has been reviewed and is accurate and complete Lynne Leader, M.D.

## 2020-02-28 LAB — CBC WITH DIFFERENTIAL/PLATELET
Basophils Absolute: 0.1 10*3/uL (ref 0.0–0.1)
Basophils Relative: 1.2 % (ref 0.0–3.0)
Eosinophils Absolute: 0.1 10*3/uL (ref 0.0–0.7)
Eosinophils Relative: 1.5 % (ref 0.0–5.0)
HCT: 39.7 % (ref 39.0–52.0)
Hemoglobin: 13.3 g/dL (ref 13.0–17.0)
Lymphocytes Relative: 22.6 % (ref 12.0–46.0)
Lymphs Abs: 1.3 10*3/uL (ref 0.7–4.0)
MCHC: 33.6 g/dL (ref 30.0–36.0)
MCV: 99.6 fl (ref 78.0–100.0)
Monocytes Absolute: 0.5 10*3/uL (ref 0.1–1.0)
Monocytes Relative: 8.8 % (ref 3.0–12.0)
Neutro Abs: 3.9 10*3/uL (ref 1.4–7.7)
Neutrophils Relative %: 65.9 % (ref 43.0–77.0)
Platelets: 245 10*3/uL (ref 150.0–400.0)
RBC: 3.98 Mil/uL — ABNORMAL LOW (ref 4.22–5.81)
RDW: 14.7 % (ref 11.5–15.5)
WBC: 5.9 10*3/uL (ref 4.0–10.5)

## 2020-02-28 LAB — C-REACTIVE PROTEIN: CRP: <1 mg/dL (ref 0.5–20.0)

## 2020-02-28 LAB — SEDIMENTATION RATE: Sed Rate: 13 mm/hr (ref 0–20)

## 2020-02-28 LAB — CK: Total CK: 130 U/L (ref 7–232)

## 2020-02-28 LAB — RHEUMATOID FACTOR: Rheumatoid fact SerPl-aCnc: <14 IU/mL (ref <14)

## 2020-02-28 LAB — TSH: TSH: 1.4 u[IU]/mL (ref 0.35–4.50)

## 2020-02-28 LAB — COMPREHENSIVE METABOLIC PANEL
ALT: 25 U/L (ref 0–53)
AST: 24 U/L (ref 0–37)
Albumin: 4.5 g/dL (ref 3.5–5.2)
Alkaline Phosphatase: 71 U/L (ref 39–117)
BUN: 14 mg/dL (ref 6–23)
CO2: 24 mEq/L (ref 19–32)
Calcium: 9.5 mg/dL (ref 8.4–10.5)
Chloride: 105 mEq/L (ref 96–112)
Creatinine, Ser: 0.78 mg/dL (ref 0.40–1.50)
GFR: 92.75 mL/min (ref >60.00)
Glucose, Bld: 88 mg/dL (ref 70–99)
Potassium: 4.8 mEq/L (ref 3.5–5.1)
Sodium: 138 mEq/L (ref 135–145)
Total Bilirubin: 0.3 mg/dL (ref 0.2–1.2)
Total Protein: 7.3 g/dL (ref 6.0–8.3)

## 2020-02-28 LAB — ANA: Anti Nuclear Antibody (ANA): NEGATIVE

## 2020-02-28 NOTE — Progress Notes (Signed)
Labs so far are effectively normal. Few more labs are still pending.

## 2020-02-29 ENCOUNTER — Encounter: Payer: Self-pay | Admitting: Rehabilitative and Restorative Service Providers"

## 2020-02-29 ENCOUNTER — Other Ambulatory Visit: Payer: Self-pay

## 2020-02-29 ENCOUNTER — Ambulatory Visit (INDEPENDENT_AMBULATORY_CARE_PROVIDER_SITE_OTHER): Payer: 59 | Admitting: Rehabilitative and Restorative Service Providers"

## 2020-02-29 DIAGNOSIS — R29898 Other symptoms and signs involving the musculoskeletal system: Secondary | ICD-10-CM | POA: Diagnosis not present

## 2020-02-29 DIAGNOSIS — M25652 Stiffness of left hip, not elsewhere classified: Secondary | ICD-10-CM

## 2020-02-29 DIAGNOSIS — M25651 Stiffness of right hip, not elsewhere classified: Secondary | ICD-10-CM

## 2020-02-29 DIAGNOSIS — S76312A Strain of muscle, fascia and tendon of the posterior muscle group at thigh level, left thigh, initial encounter: Secondary | ICD-10-CM

## 2020-02-29 DIAGNOSIS — R531 Weakness: Secondary | ICD-10-CM | POA: Diagnosis not present

## 2020-02-29 NOTE — Therapy (Signed)
[place], [place], [address] Phone: [phone]   Fax:  [phone]  Physical Therapy Treatment  Patient Details  Name: MARCIO Rios MRN: [id number] Date of Birth: 1956/01/18 Referring Provider (PT): Dr Lynne Leader    Encounter Date: 02/29/2020   PT End of Session - 02/29/20 1454    Visit Number 5    Number of Visits 12    Date for PT Re-Evaluation 03/18/20    PT Start Time 9211    PT Stop Time 1543    PT Time Calculation (min) 50 min    Activity Tolerance Patient tolerated treatment well           Past Medical History:  Diagnosis Date  . ADD (attention deficit disorder)   . Anemia   . Arthritis   . CAD (coronary artery disease)    2/19 PCI/DESx1 to Lcx  . Diabetes mellitus without complication ([place])   . Exercise-induced asthma   . Heart murmur   . Hyperlipidemia    family hx of high cholesterol  . Hypogonadism in male   . Low back pain   . Osteoarthritis     Past Surgical History:  Procedure Laterality Date  . CORONARY STENT INTERVENTION N/A 06/23/2017   Procedure: CORONARY STENT INTERVENTION;  Surgeon: Leonie Man, MD;  Location: [facility];  Service: Cardiovascular;  Laterality: N/A;  . INTRAVASCULAR PRESSURE WIRE/FFR STUDY N/A 06/23/2017   Procedure: INTRAVASCULAR PRESSURE WIRE/FFR STUDY;  Surgeon: Leonie Man, MD;  Location: [place] [facility];  Service: Cardiovascular;  Laterality: N/A;  . KNEE ARTHROSCOPY    . LEFT HEART CATH AND CORONARY ANGIOGRAPHY N/A 06/23/2017   Procedure: LEFT HEART CATH AND CORONARY ANGIOGRAPHY;  Surgeon: Leonie Man, MD;  Location: [facility];  Service: Cardiovascular;  Laterality: N/A;  . SHOULDER ARTHROSCOPY WITH BICEPSTENOTOMY Right 09/06/2019   Procedure: SHOULDER ARTHROSCOPY WITH BICEPSTENOTOMY;  Surgeon: Leandrew Koyanagi, MD;  Location: [place];  Service: Orthopedics;  Laterality: Right;  . SHOULDER  ARTHROSCOPY WITH SUBACROMIAL DECOMPRESSION Right 09/06/2019   Procedure: RIGHT SHOULDER ARTHROSCOPY WITH EXTENSIVE DEBRIDEMENT, SUBACROMIAL DECOMPRESSION, BICEPS TENOTOMY;  Surgeon: Leandrew Koyanagi, MD;  Location: [facility];  Service: Orthopedics;  Laterality: Right;  . TENOTOMY ACHILLES TENDON      There were no vitals filed for this visit.   Subjective Assessment - 02/29/20 1455    Subjective Patient reports that he saw Dr Georgina Snell yesterday. He will get some blood work done and an xray of the neck. Patient wonders why he is so stiff and why he tightens up after pickleball. Thinks the exercises help    Currently in Pain? Yes    Pain Score 4     Pain Location Back    Pain Orientation Right    Pain Descriptors / Indicators Sore;Tightness    Pain Type Acute pain    Pain Radiating Towards along the Rt side of the back                             [facility] Adult PT Treatment/Exercise - 02/29/20 0001      Knee/Hip Exercises: Stretches   Piriformis Stretch Right;Left;2 reps;20 seconds   supine travell    Other Knee/Hip Stretches double knee to chest knees apart 30 sec x 3 reps     Other Knee/Hip Stretches sitting lateral trunk flexion 30 sec x  3 reps reaching to the Lt over head       Knee/Hip Exercises: Aerobic   Nustep L6 x 5 min UE 10       Moist Heat Therapy   Number Minutes Moist Heat 10 Minutes    Moist Heat Location Lumbar Spine      Manual Therapy   Manual Therapy --   pt prone    Manual therapy comments skilled palpation to assess response to DN/manual work     Joint Mobilization lumbar PA mobs through lumbar spine Grade II/III     Soft tissue mobilization working through the lumbar paraspinals and into gluts             Trigger Point Dry Needling - 02/29/20 0001    Consent Given? Yes    Education Handout Provided Previously provided    Printmaker Performed with Dry Needling Yes    Other Dry Needling bilat     Gluteus Medius  Response Palpable increased muscle length    Gluteus Maximus Response Palpable increased muscle length    Piriformis Response Palpable increased muscle length    Erector spinae Response Palpable increased muscle length    Lumbar multifidi Response Palpable increased muscle length    Quadratus Lumborum Response Palpable increased muscle length                     PT Long Term Goals - 02/05/20 1718      PT LONG TERM GOAL #1   Title Patient to be independent with advanced HEP.    Time 6    Period Weeks    Status New    Target Date 03/18/20      PT LONG TERM GOAL #2   Title Improve LE tissue extensibility demonstrated by increased hamstring flexibility to 70 deg with no pain    Time 6    Period Weeks    Status New    Target Date 03/18/20      PT LONG TERM GOAL #3   Title LE hip extension strength 4/5 to 4+/5    Time 6    Period Weeks    Status New    Target Date 03/18/20      PT LONG TERM GOAL #4   Title Patient reports return to pickleball at level prior to injury 01/25/20    Time 6    Period Weeks    Status New    Target Date 03/18/20      PT LONG TERM GOAL #5   Title Improve FOTO to </= 31% limitation    Time 6    Period Weeks    Status New    Target Date 03/18/20                 Plan - 02/29/20 1658    Clinical Impression Statement Continued muscular tightness and shortening bilat LE's and bilat LB. Positive response to stretching - needs consistency over time to produce tissue changes. Positive response to DN and manual work. Address LBP and tightness with DN today with good release of tissue tightness noted.    Rehab Potential Good    PT Frequency 2x / week    PT Duration 6 weeks    PT Treatment/Interventions ADLs/Self Care Home Management;Aquatic Therapy;Cryotherapy;Electrical Stimulation;Iontophoresis 4mg /ml Dexamethasone;Moist Heat;Ultrasound;Gait training;Stair training;Functional mobility training;Therapeutic activities;Therapeutic  exercise;Balance training;Neuromuscular re-education;Patient/family education;Manual techniques;Passive range of motion;Dry needling;Taping;Vasopneumatic Device    PT Next Visit Plan Add core engagement with functional acitivities/ exercises; progress  functional strengthening and sports specific exercise as pain, edema decrease and flexibility increases; assess response to DN to Rt lumbar/Rt hip; needs work on transfers and transitional movements    PT Home Exercise Plan [id number]    Consulted and Agree with Plan of Care Patient           Patient will benefit from skilled therapeutic intervention in order to improve the following deficits and impairments:     Visit Diagnosis: Hamstring strain, left, initial encounter  Other symptoms and signs involving the musculoskeletal system  Weakness generalized  Stiffness of both hip joints     Problem List Patient Active Problem List   Diagnosis Date Noted  . Myofascial pain syndrome, cervical 07/28/2019  . Lateral epicondylitis of right elbow 07/28/2019  . Nontraumatic incomplete tear of right rotator cuff 07/25/2019  . Tendinopathy of right biceps tendon 07/25/2019  . Erectile dysfunction 07/11/2019  . Mild persistent asthma/cough variant asthma 04/13/2019  . Allergic rhinitis with a possible nonallergic component 04/13/2019  . Acid reflux 04/13/2019  . Cough 04/13/2019  . Hoarseness 04/13/2019  . Cervical radicular pain 01/10/2019  . Ulnar neuropathy at elbow of right upper extremity 01/03/2019  . Acute bilateral low back pain without sciatica 01/03/2019  . Onychomycosis 05/25/2018  . Palpitations 07/06/2017  . CAD (coronary artery disease) 07/05/2017  . Family history of early CAD 07/05/2017  . Abnormal findings on diagnostic imaging of cardiovascular system 06/23/2017  . Low vitamin D level 04/22/2017  . Pain in right ankle and joints of right foot 10/05/2016  . Hemarthrosis of right elbow 02/03/2016  . Small thenar eminence  02/03/2016  . Cervical disc disorder with radiculopathy of cervical region 10/07/2015  . Incomplete rotator cuff tear 09/10/2015  . Nonischemic cardiomyopathy ([place]) 05/15/2015  . Fatigue 05/15/2015  . Dyspnea on exertion 05/15/2015  . Decreased cardiac ejection fraction 04/02/2015  . Strain of latissimus dorsi muscle 01/21/2015  . Shoulder bursitis 10/12/2014  . Stenosis of lateral recess of lumbar spine 10/12/2014  . BPPV (benign paroxysmal positional vertigo) 08/14/2014  . Hyperlipidemia 01/19/2014  . Anemia, unspecified 01/19/2014  . ADD (attention deficit disorder) 01/19/2014  . Type 2 diabetes mellitus with hyperglycemia, without long-term current use of insulin ([place]) 12/22/2013  . Hypogonadism in male 12/22/2013  . Arthritis of hand, left 12/22/2013    Kanye Depree Nilda Simmer PT, MPH 02/29/2020, 5:00 PM  [facility] [place], [place], [address] Phone: [phone]   Fax:  [phone]  Name: HYDEN SOLEY MRN: [id number] Date of Birth: 1955-07-26

## 2020-02-29 NOTE — Progress Notes (Signed)
Further labs are negative.

## 2020-03-01 NOTE — Progress Notes (Signed)
X-ray cervical spine shows intact appearing prior fusion at C4-C5 and C5-C6.  Arthritis seen at C6-7 where you are probably experiencing a pinched nerve which we would see better with MRI in the future if needed.

## 2020-03-06 ENCOUNTER — Other Ambulatory Visit: Payer: Self-pay

## 2020-03-06 ENCOUNTER — Ambulatory Visit (INDEPENDENT_AMBULATORY_CARE_PROVIDER_SITE_OTHER): Payer: 59 | Admitting: Physical Therapy

## 2020-03-06 DIAGNOSIS — S76312A Strain of muscle, fascia and tendon of the posterior muscle group at thigh level, left thigh, initial encounter: Secondary | ICD-10-CM | POA: Diagnosis not present

## 2020-03-06 DIAGNOSIS — R531 Weakness: Secondary | ICD-10-CM | POA: Diagnosis not present

## 2020-03-06 DIAGNOSIS — R29898 Other symptoms and signs involving the musculoskeletal system: Secondary | ICD-10-CM | POA: Diagnosis not present

## 2020-03-06 NOTE — Therapy (Signed)
[place], [place], [address] Phone: [phone]   Fax:  [phone]  Physical Therapy Treatment  Patient Details  Name: Reginald Rios MRN: [id number] Date of Birth: 1955/05/22 Referring Provider (PT): Dr Lynne Leader    Encounter Date: 03/06/2020   PT End of Session - 03/06/20 1651    Visit Number 6    Number of Visits 12    Date for PT Re-Evaluation 03/18/20    PT Start Time 9675    PT Stop Time 1652    PT Time Calculation (min) 47 min    Activity Tolerance Patient tolerated treatment well    Behavior During Therapy [facility] for tasks assessed/performed           Past Medical History:  Diagnosis Date  . ADD (attention deficit disorder)   . Anemia   . Arthritis   . CAD (coronary artery disease)    2/19 PCI/DESx1 to Lcx  . Diabetes mellitus without complication ([place])   . Exercise-induced asthma   . Heart murmur   . Hyperlipidemia    family hx of high cholesterol  . Hypogonadism in male   . Low back pain   . Osteoarthritis     Past Surgical History:  Procedure Laterality Date  . CORONARY STENT INTERVENTION N/A 06/23/2017   Procedure: CORONARY STENT INTERVENTION;  Surgeon: Leonie Man, MD;  Location: [place] [facility];  Service: Cardiovascular;  Laterality: N/A;  . INTRAVASCULAR PRESSURE WIRE/FFR STUDY N/A 06/23/2017   Procedure: INTRAVASCULAR PRESSURE WIRE/FFR STUDY;  Surgeon: Leonie Man, MD;  Location: [place] [facility];  Service: Cardiovascular;  Laterality: N/A;  . KNEE ARTHROSCOPY    . LEFT HEART CATH AND CORONARY ANGIOGRAPHY N/A 06/23/2017   Procedure: LEFT HEART CATH AND CORONARY ANGIOGRAPHY;  Surgeon: Leonie Man, MD;  Location: [place] [facility];  Service: Cardiovascular;  Laterality: N/A;  . SHOULDER ARTHROSCOPY WITH BICEPSTENOTOMY Right 09/06/2019   Procedure: SHOULDER ARTHROSCOPY WITH BICEPSTENOTOMY;  Surgeon: Leandrew Koyanagi, MD;  Location: [place];  Service: Orthopedics;  Laterality: Right;  . SHOULDER ARTHROSCOPY WITH SUBACROMIAL DECOMPRESSION Right 09/06/2019   Procedure: RIGHT SHOULDER ARTHROSCOPY WITH EXTENSIVE DEBRIDEMENT, SUBACROMIAL DECOMPRESSION, BICEPS TENOTOMY;  Surgeon: Leandrew Koyanagi, MD;  Location: [place];  Service: Orthopedics;  Laterality: Right;  . TENOTOMY ACHILLES TENDON      There were no vitals filed for this visit.   Subjective Assessment - 03/06/20 1611    Subjective Pt reports he continues to play pickle ball 1x/day.  He reports the DN helped, but the tightness came back as soon as he got out of his car. LLE continues to be painful.    Currently in Pain? Yes    Pain Score 6     Pain Location Back    Pain Orientation Left   Only 2/10 in Rt buttocks   Pain Descriptors / Indicators Aching    Pain Radiating Towards to mid hamstring    Aggravating Factors  bending over    Pain Relieving Factors ice              [facility] PT Assessment - 03/06/20 0001      Flexibility   Hamstrings Lt 63 deg; Rt 60   Quadriceps Lt 105, Rt 115.             [facility] Adult PT Treatment/Exercise - 03/06/20 0001      Knee/Hip Exercises: Stretches   Passive  Hamstring Stretch Right;Left;2 reps;30 seconds   supine with strap   Quad Stretch Right;Left;2 reps;30 seconds   prone with strap   Piriformis Stretch Right;Left;2 reps;20 seconds   supine travell    Other Knee/Hip Stretches standing trunk ext x 5 sec x 5 reps     Other Knee/Hip Stretches midlevel doorway stretch x 20 sec x 3 reps cues on foot/back position.  prone on elbows x 8 sec x 3 reps      Knee/Hip Exercises: Aerobic   Recumbent Bike L2: 5 min     Other Aerobic single laps around gym to assess response to exercises.       Knee/Hip Exercises: Seated   Other Seated Knee/Hip Exercises sit to/from stand to elevated table (to simulate getting in/out of car) with core engaged x 5 reps     Sit to Sand 10 reps;without UE support   black mat, arms  forward, core engaged. back straight     Knee/Hip Exercises: Supine   Other Supine Knee/Hip Exercises core engaged with log roll to Lt/Rt x 2 reps          functional squat (to simulate reaching for pickleball with good mechanics) with core engaged and hip hinge x 5 reps    PT Long Term Goals - 02/05/20 1718      PT LONG TERM GOAL #1   Title Patient to be independent with advanced HEP.    Time 6    Period Weeks    Status New    Target Date 03/18/20      PT LONG TERM GOAL #2   Title Improve LE tissue extensibility demonstrated by increased hamstring flexibility to 70 deg with no pain    Time 6    Period Weeks    Status New    Target Date 03/18/20      PT LONG TERM GOAL #3   Title LE hip extension strength 4/5 to 4+/5    Time 6    Period Weeks    Status New    Target Date 03/18/20      PT LONG TERM GOAL #4   Title Patient reports return to pickleball at level prior to injury 01/25/20    Time 6    Period Weeks    Status New    Target Date 03/18/20      PT LONG TERM GOAL #5   Title Improve FOTO to </= 31% limitation    Time 6    Period Weeks    Status New    Target Date 03/18/20                 Plan - 03/06/20 1625    Clinical Impression Statement Pt reported significant reduction in LBP and LLE pain while performing neutral spine exercises.  Encouraged pt to take a break from Pickle ball for a few days to allow back to calm down, and consider making changes to form when playing game (ie: less lumbar flexion).  Pt's pain level at 1/10 at end of session.    Rehab Potential Good    PT Frequency 2x / week    PT Duration 6 weeks    PT Treatment/Interventions ADLs/Self Care Home Management;Aquatic Therapy;Cryotherapy;Electrical Stimulation;Iontophoresis 4mg /ml Dexamethasone;Moist Heat;Ultrasound;Gait training;Stair training;Functional mobility training;Therapeutic activities;Therapeutic exercise;Balance training;Neuromuscular re-education;Patient/family  education;Manual techniques;Passive range of motion;Dry needling;Taping;Vasopneumatic Device    PT Next Visit Plan Add core engagement with functional acitivities/ exercises; progress functional strengthening and sports specific exercise as pain, edema decrease and  flexibility increases; assess response to DN to Rt lumbar/Rt hip; needs continued work on transfers and transitional movements    PT Home Exercise Plan [id number]    Consulted and Agree with Plan of Care Patient           Patient will benefit from skilled therapeutic intervention in order to improve the following deficits and impairments:     Visit Diagnosis: Hamstring strain, left, initial encounter  Other symptoms and signs involving the musculoskeletal system  Weakness generalized     Problem List Patient Active Problem List   Diagnosis Date Noted  . Myofascial pain syndrome, cervical 07/28/2019  . Lateral epicondylitis of right elbow 07/28/2019  . Nontraumatic incomplete tear of right rotator cuff 07/25/2019  . Tendinopathy of right biceps tendon 07/25/2019  . Erectile dysfunction 07/11/2019  . Mild persistent asthma/cough variant asthma 04/13/2019  . Allergic rhinitis with a possible nonallergic component 04/13/2019  . Acid reflux 04/13/2019  . Cough 04/13/2019  . Hoarseness 04/13/2019  . Cervical radicular pain 01/10/2019  . Ulnar neuropathy at elbow of right upper extremity 01/03/2019  . Acute bilateral low back pain without sciatica 01/03/2019  . Onychomycosis 05/25/2018  . Palpitations 07/06/2017  . CAD (coronary artery disease) 07/05/2017  . Family history of early CAD 07/05/2017  . Abnormal findings on diagnostic imaging of cardiovascular system 06/23/2017  . Low vitamin D level 04/22/2017  . Pain in right ankle and joints of right foot 10/05/2016  . Hemarthrosis of right elbow 02/03/2016  . Small thenar eminence 02/03/2016  . Cervical disc disorder with radiculopathy of cervical region 10/07/2015  .  Incomplete rotator cuff tear 09/10/2015  . Nonischemic cardiomyopathy ([place]) 05/15/2015  . Fatigue 05/15/2015  . Dyspnea on exertion 05/15/2015  . Decreased cardiac ejection fraction 04/02/2015  . Strain of latissimus dorsi muscle 01/21/2015  . Shoulder bursitis 10/12/2014  . Stenosis of lateral recess of lumbar spine 10/12/2014  . BPPV (benign paroxysmal positional vertigo) 08/14/2014  . Hyperlipidemia 01/19/2014  . Anemia, unspecified 01/19/2014  . ADD (attention deficit disorder) 01/19/2014  . Type 2 diabetes mellitus with hyperglycemia, without long-term current use of insulin ([place]) 12/22/2013  . Hypogonadism in male 12/22/2013  . Arthritis of hand, left 12/22/2013   Kerin Perna, PTA 03/06/20 5:01 PM  [facility] [place], [place], [address] Phone: [phone]   Fax:  [phone]  Name: MARKEVION LATTIN MRN: [id number] Date of Birth: 03/24/1956

## 2020-03-07 ENCOUNTER — Encounter: Payer: 59 | Admitting: Physical Therapy

## 2020-03-07 MED FILL — JANUVIA 100 MG TABLET: 100 | 30 days supply | Qty: 30 | Fill #1

## 2020-03-07 MED FILL — FARXIGA 10 MG TABLET: 10 | 30 days supply | Qty: 30 | Fill #5

## 2020-03-07 MED FILL — PIOGLITAZONE HCL 30 MG TAB: 30 | 30 days supply | Qty: 30 | Fill #3

## 2020-03-12 ENCOUNTER — Ambulatory Visit (INDEPENDENT_AMBULATORY_CARE_PROVIDER_SITE_OTHER): Payer: 59 | Admitting: Rehabilitative and Restorative Service Providers"

## 2020-03-12 ENCOUNTER — Other Ambulatory Visit: Payer: Self-pay

## 2020-03-12 DIAGNOSIS — M25651 Stiffness of right hip, not elsewhere classified: Secondary | ICD-10-CM

## 2020-03-12 DIAGNOSIS — R29898 Other symptoms and signs involving the musculoskeletal system: Secondary | ICD-10-CM | POA: Diagnosis not present

## 2020-03-12 DIAGNOSIS — S76312A Strain of muscle, fascia and tendon of the posterior muscle group at thigh level, left thigh, initial encounter: Secondary | ICD-10-CM

## 2020-03-12 DIAGNOSIS — M25652 Stiffness of left hip, not elsewhere classified: Secondary | ICD-10-CM

## 2020-03-12 DIAGNOSIS — R531 Weakness: Secondary | ICD-10-CM

## 2020-03-12 NOTE — Patient Instructions (Signed)
Access Code: [email or web address]: [email or web address]: 11/09/2021Prepared by: Haset Oaxaca HoltExercises  Prone Quadriceps Stretch with Strap - 2 x daily - 7 x weekly - 1 sets - 3 reps - 30 sec hold  Hooklying Hamstring Stretch with Strap - 2 x daily - 7 x weekly - 1 sets - 3 reps - 30 sec hold  Hip Adductors and Hamstring Stretch with Strap - 2 x daily - 7 x weekly - 1 sets - 3 reps - 30 sec hold  Supine Piriformis Stretch with Leg Straight - 2 x daily - 7 x weekly - 1 sets - 3 reps - 30 sec hold  Supine ITB Stretch with Strap - 2 x daily - 7 x weekly - 1 sets - 3 reps - 30 sec hold  Seated Hamstring Stretch - 2 x daily - 7 x weekly - 1 sets - 3 reps - 30 sec hold  Gastroc Stretch on Wall - 2 x daily - 7 x weekly - 1 sets - 3 reps - 30 sec hold  Cat-Camel - 2 x daily - 7 x weekly - 1 sets - 5-10 reps - 3-5 sec hold  Supine Double Knee to Chest - 2 x daily - 7 x weekly - 1 sets - 3 reps - 30 sec hold  Seated Sidebending - 2 x daily - 7 x weekly - 1 sets - 3 reps - 20-30 sec hold  Standing Plank on Wall - 2 x daily - 7 x weekly - 1 sets - 3 reps - 30 sec hold

## 2020-03-12 NOTE — Therapy (Signed)
[place], [place], [address] Phone: [phone]   Fax:  [phone]  Physical Therapy Treatment  Patient Details  Name: Reginald Rios MRN: [id number] Date of Birth: 1956-01-28 Referring Provider (PT): Dr Lynne Leader    Encounter Date: 03/12/2020   PT End of Session - 03/12/20 1155    Visit Number 7    Number of Visits 12    Date for PT Re-Evaluation 03/18/20    PT Start Time 1150    PT Stop Time 1238    PT Time Calculation (min) 48 min    Activity Tolerance Patient tolerated treatment well           Past Medical History:  Diagnosis Date   ADD (attention deficit disorder)    Anemia    Arthritis    CAD (coronary artery disease)    2/19 PCI/DESx1 to Lcx   Diabetes mellitus without complication ([place])    Exercise-induced asthma    Heart murmur    Hyperlipidemia    family hx of high cholesterol   Hypogonadism in male    Low back pain    Osteoarthritis     Past Surgical History:  Procedure Laterality Date   CORONARY STENT INTERVENTION N/A 06/23/2017   Procedure: CORONARY STENT INTERVENTION;  Surgeon: Leonie Man, MD;  Location: [facility];  Service: Cardiovascular;  Laterality: N/A;   INTRAVASCULAR PRESSURE WIRE/FFR STUDY N/A 06/23/2017   Procedure: INTRAVASCULAR PRESSURE WIRE/FFR STUDY;  Surgeon: Leonie Man, MD;  Location: [facility];  Service: Cardiovascular;  Laterality: N/A;   KNEE ARTHROSCOPY     LEFT HEART CATH AND CORONARY ANGIOGRAPHY N/A 06/23/2017   Procedure: LEFT HEART CATH AND CORONARY ANGIOGRAPHY;  Surgeon: Leonie Man, MD;  Location: [facility];  Service: Cardiovascular;  Laterality: N/A;   SHOULDER ARTHROSCOPY WITH BICEPSTENOTOMY Right 09/06/2019   Procedure: SHOULDER ARTHROSCOPY WITH BICEPSTENOTOMY;  Surgeon: Leandrew Koyanagi, MD;  Location: [facility];  Service: Orthopedics;  Laterality: Right;   SHOULDER  ARTHROSCOPY WITH SUBACROMIAL DECOMPRESSION Right 09/06/2019   Procedure: RIGHT SHOULDER ARTHROSCOPY WITH EXTENSIVE DEBRIDEMENT, SUBACROMIAL DECOMPRESSION, BICEPS TENOTOMY;  Surgeon: Leandrew Koyanagi, MD;  Location: [facility];  Service: Orthopedics;  Laterality: Right;   TENOTOMY ACHILLES TENDON      There were no vitals filed for this visit.   Subjective Assessment - 03/12/20 1155    Subjective Stopped playing pickle ball for 1 week. Rt LE is good. Still having tightness in the Lt hamstring and his back is "killing me" Pain every time he picks up something from the floor. Stretches do help but the back pain never goes away completely.    Currently in Pain? Yes    Pain Score 7     Pain Location Back    Pain Orientation Left    Pain Descriptors / Indicators Aching;Tightness    Pain Type Acute pain    Pain Frequency Constant    Aggravating Factors  bensing over    Pain Relieving Factors stretching; ice              [facility] PT Assessment - 03/12/20 0001      Assessment   Medical Diagnosis Rt hamstring strain     Referring Provider (PT) Dr Lynne Leader     Onset Date/Surgical Date 01/25/20    Hand Dominance Right    Next MD Visit 02/27/20    Prior Therapy yes for  neck; shoulder; LB; anle       Posture/Postural Control   Posture Comments head forward; shoulders rounded; flexed forward at hips       AROM   Right/Left Hip --   tight end ranges - some improvement    Right/Left Knee --   WFL's bilat    Lumbar Flexion 85% LBP     Lumbar Extension 50%     Lumbar - Right Side Bend 65% LBP    Lumbar - Left Side Bend 65% LBP     Lumbar - Right Rotation 80%    Lumbar - Left Rotation 80%      Strength   Right Hip Extension 3+/5    Left Hip Extension 3+/5    Right Knee Flexion 4+/5    Right Knee Extension 4+/5    Left Knee Flexion 5/5    Left Knee Extension 5/5      Flexibility   Hamstrings Lt 63 deg; Rt 65     Quadriceps Lt 105, Rt 115.       Palpation   Spinal  mobility hypomobility lumbar spine with PA mobs     Palpation comment muscular tightness through the posterior thigh to knee to calf                          [facility] Adult PT Treatment/Exercise - 03/12/20 0001      Knee/Hip Exercises: Stretches   Passive Hamstring Stretch Right;Left;2 reps;30 seconds   supine with strap   Quad Stretch Right;Left;2 reps;30 seconds   prone with strap   Piriformis Stretch Right;Left;2 reps;20 seconds   supine travell      Knee/Hip Exercises: Aerobic   Recumbent Bike L2: 5 min       Knee/Hip Exercises: Standing   Other Standing Knee Exercises wall plank 60 sec x 2 reps       Knee/Hip Exercises: Seated   Sit to Sand 10 reps;without UE support   black mat, arms forward, core engaged. back straight     Moist Heat Therapy   Number Minutes Moist Heat 10 Minutes    Moist Heat Location Lumbar Spine      Manual Therapy   Manual therapy comments skilled palpation to assess response to DN/manual work     Joint Mobilization lumbar PA mobs through lumbar spine Grade II/III     Soft tissue mobilization working through the lumbar paraspinals and into gluts             Trigger Point Dry Needling - 03/12/20 0001    Consent Given? Yes    Education Handout Provided Previously provided    Printmaker Performed with Dry Needling Yes    Other Dry Needling bilat     Gluteus Medius Response Palpable increased muscle length    Gluteus Maximus Response Palpable increased muscle length    Piriformis Response Palpable increased muscle length    Erector spinae Response Palpable increased muscle length   Rt only    Lumbar multifidi Response Palpable increased muscle length   Rt only    Quadratus Lumborum Response Palpable increased muscle length   Rt only                PT Education - 03/12/20 1245    Education Details HEP    Person(s) Educated Patient    Methods Explanation;Demonstration;Tactile cues;Verbal cues;Handout     Comprehension Verbalized understanding;Returned demonstration;Verbal cues required;Tactile cues required  PT Long Term Goals - 02/05/20 1718      PT LONG TERM GOAL #1   Title Patient to be independent with advanced HEP.    Time 6    Period Weeks    Status New    Target Date 03/18/20      PT LONG TERM GOAL #2   Title Improve LE tissue extensibility demonstrated by increased hamstring flexibility to 70 deg with no pain    Time 6    Period Weeks    Status New    Target Date 03/18/20      PT LONG TERM GOAL #3   Title LE hip extension strength 4/5 to 4+/5    Time 6    Period Weeks    Status New    Target Date 03/18/20      PT LONG TERM GOAL #4   Title Patient reports return to pickleball at level prior to injury 01/25/20    Time 6    Period Weeks    Status New    Target Date 03/18/20      PT LONG TERM GOAL #5   Title Improve FOTO to </= 31% limitation    Time 6    Period Weeks    Status New    Target Date 03/18/20                 Plan - 03/12/20 1240    Clinical Impression Statement Continued improvement. Patient has good resolution of Rt LE pain; has persistent discomfort in the Lt hamstrings and Rt LB. Can achieve resolution of pain with DN and manual work as well as exercise. Discussed holding on pickle ball another week. Encouraged consistent HEP and continued work on Economist and proper movement.    Rehab Potential Good    PT Frequency 2x / week    PT Duration 6 weeks    PT Treatment/Interventions ADLs/Self Care Home Management;Aquatic Therapy;Cryotherapy;Electrical Stimulation;Iontophoresis 4mg /ml Dexamethasone;Moist Heat;Ultrasound;Gait training;Stair training;Functional mobility training;Therapeutic activities;Therapeutic exercise;Balance training;Neuromuscular re-education;Patient/family education;Manual techniques;Passive range of motion;Dry needling;Taping;Vasopneumatic Device    PT Next Visit Plan Add core engagement with  functional acitivities/ exercises; progress functional strengthening and sports specific exercise as pain, edema decrease and flexibility increases; assess response to DN to Rt lumbar/Rt hip; needs work on transfers and transitional movements    PT Home Exercise Plan [id number]    Consulted and Agree with Plan of Care Patient           Patient will benefit from skilled therapeutic intervention in order to improve the following deficits and impairments:     Visit Diagnosis: Hamstring strain, left, initial encounter  Other symptoms and signs involving the musculoskeletal system  Weakness generalized  Stiffness of both hip joints     Problem List Patient Active Problem List   Diagnosis Date Noted   Myofascial pain syndrome, cervical 07/28/2019   Lateral epicondylitis of right elbow 07/28/2019   Nontraumatic incomplete tear of right rotator cuff 07/25/2019   Tendinopathy of right biceps tendon 07/25/2019   Erectile dysfunction 07/11/2019   Mild persistent asthma/cough variant asthma 04/13/2019   Allergic rhinitis with a possible nonallergic component 04/13/2019   Acid reflux 04/13/2019   Cough 04/13/2019   Hoarseness 04/13/2019   Cervical radicular pain 01/10/2019   Ulnar neuropathy at elbow of right upper extremity 01/03/2019   Acute bilateral low back pain without sciatica 01/03/2019   Onychomycosis 05/25/2018   Palpitations 07/06/2017   CAD (coronary artery disease) 07/05/2017   Family history  of early CAD 07/05/2017   Abnormal findings on diagnostic imaging of cardiovascular system 06/23/2017   Low vitamin D level 04/22/2017   Pain in right ankle and joints of right foot 10/05/2016   Hemarthrosis of right elbow 02/03/2016   Small thenar eminence 02/03/2016   Cervical disc disorder with radiculopathy of cervical region 10/07/2015   Incomplete rotator cuff tear 09/10/2015   Nonischemic cardiomyopathy ([place]) 05/15/2015   Fatigue 05/15/2015    Dyspnea on exertion 05/15/2015   Decreased cardiac ejection fraction 04/02/2015   Strain of latissimus dorsi muscle 01/21/2015   Shoulder bursitis 10/12/2014   Stenosis of lateral recess of lumbar spine 10/12/2014   BPPV (benign paroxysmal positional vertigo) 08/14/2014   Hyperlipidemia 01/19/2014   Anemia, unspecified 01/19/2014   ADD (attention deficit disorder) 01/19/2014   Type 2 diabetes mellitus with hyperglycemia, without long-term current use of insulin ([place]) 12/22/2013   Hypogonadism in male 12/22/2013   Arthritis of hand, left 12/22/2013    Jaxxen Voong Nilda Simmer PT, MPH  03/12/2020, 12:46 PM  [facility] [place], [place], [address] Phone: [phone]   Fax:  [phone]  Name: Reginald Rios MRN: [id number] Date of Birth: 1955-12-05

## 2020-03-15 ENCOUNTER — Ambulatory Visit (INDEPENDENT_AMBULATORY_CARE_PROVIDER_SITE_OTHER): Payer: 59 | Admitting: Physical Therapy

## 2020-03-15 ENCOUNTER — Encounter: Payer: Self-pay | Admitting: Physical Therapy

## 2020-03-15 ENCOUNTER — Other Ambulatory Visit: Payer: Self-pay

## 2020-03-15 DIAGNOSIS — R531 Weakness: Secondary | ICD-10-CM

## 2020-03-15 DIAGNOSIS — R29898 Other symptoms and signs involving the musculoskeletal system: Secondary | ICD-10-CM

## 2020-03-15 DIAGNOSIS — M25651 Stiffness of right hip, not elsewhere classified: Secondary | ICD-10-CM | POA: Diagnosis not present

## 2020-03-15 DIAGNOSIS — S76312A Strain of muscle, fascia and tendon of the posterior muscle group at thigh level, left thigh, initial encounter: Secondary | ICD-10-CM | POA: Diagnosis not present

## 2020-03-15 DIAGNOSIS — M25652 Stiffness of left hip, not elsewhere classified: Secondary | ICD-10-CM

## 2020-03-15 NOTE — Therapy (Signed)
[place], [place], [address] Phone: [phone]   Fax:  [phone]  Physical Therapy Treatment  Patient Details  Name: Reginald Rios MRN: [id number] Date of Birth: 1956/04/08 Referring Provider (PT): Dr Lynne Leader    Encounter Date: 03/15/2020   PT End of Session - 03/15/20 1029    Visit Number 8    Number of Visits 12    Date for PT Re-Evaluation 03/18/20    PT Start Time 1021    PT Stop Time 1103    PT Time Calculation (min) 42 min    Activity Tolerance Patient tolerated treatment well;No increased pain    Behavior During Therapy WFL for tasks assessed/performed           Past Medical History:  Diagnosis Date  . ADD (attention deficit disorder)   . Anemia   . Arthritis   . CAD (coronary artery disease)    2/19 PCI/DESx1 to Lcx  . Diabetes mellitus without complication ([place])   . Exercise-induced asthma   . Heart murmur   . Hyperlipidemia    family hx of high cholesterol  . Hypogonadism in male   . Low back pain   . Osteoarthritis     Past Surgical History:  Procedure Laterality Date  . CORONARY STENT INTERVENTION N/A 06/23/2017   Procedure: CORONARY STENT INTERVENTION;  Surgeon: Leonie Man, MD;  Location: [facility];  Service: Cardiovascular;  Laterality: N/A;  . INTRAVASCULAR PRESSURE WIRE/FFR STUDY N/A 06/23/2017   Procedure: INTRAVASCULAR PRESSURE WIRE/FFR STUDY;  Surgeon: Leonie Man, MD;  Location: [facility];  Service: Cardiovascular;  Laterality: N/A;  . KNEE ARTHROSCOPY    . LEFT HEART CATH AND CORONARY ANGIOGRAPHY N/A 06/23/2017   Procedure: LEFT HEART CATH AND CORONARY ANGIOGRAPHY;  Surgeon: Leonie Man, MD;  Location: [facility];  Service: Cardiovascular;  Laterality: N/A;  . SHOULDER ARTHROSCOPY WITH BICEPSTENOTOMY Right 09/06/2019   Procedure: SHOULDER ARTHROSCOPY WITH BICEPSTENOTOMY;  Surgeon: Leandrew Koyanagi, MD;  Location:  [facility];  Service: Orthopedics;  Laterality: Right;  . SHOULDER ARTHROSCOPY WITH SUBACROMIAL DECOMPRESSION Right 09/06/2019   Procedure: RIGHT SHOULDER ARTHROSCOPY WITH EXTENSIVE DEBRIDEMENT, SUBACROMIAL DECOMPRESSION, BICEPS TENOTOMY;  Surgeon: Leandrew Koyanagi, MD;  Location: [facility];  Service: Orthopedics;  Laterality: Right;  . TENOTOMY ACHILLES TENDON      There were no vitals filed for this visit.   Subjective Assessment - 03/15/20 1025    Subjective Pt complains of pain in the mornings.  "Everything is helping my legs, but my back is taking it".  He reports reduction of pain in low back after DN, but pain returns.  He hasn't played pickle ball in 2 wks.  He plans to have Rolfing session this weekend.    Pertinent History Rt Achilles tendon rupture and repair 2007; Rt RCR 09/06/18; cervical fusion 2015; AODM; chronic LBP; "disc problems"    Patient Stated Goals get leg working again    Currently in Pain? Yes    Pain Score 8     Pain Location Back    Pain Orientation Right    Pain Descriptors / Indicators Sharp    Pain Radiating Towards into low back    Aggravating Factors  sitting; bending over, picking stuff up off ground.    Pain Relieving Factors stretching, hot shower.              [facility] PT Assessment -  03/15/20 0001      Assessment   Medical Diagnosis Rt hamstring strain     Referring Provider (PT) Dr Lynne Leader     Onset Date/Surgical Date 01/25/20    Hand Dominance Right    Next MD Visit 03/26/20    Prior Therapy yes for neck; shoulder; LB; anle       Observation/Other Assessments   Focus on Therapeutic Outcomes (FOTO)  29% limitation      Strength   Right Hip Extension 4+/5    Left Hip Extension 4/5            [facility] Adult PT Treatment/Exercise - 03/15/20 0001      Knee/Hip Exercises: Stretches   Passive Hamstring Stretch Right;Left;2 reps;30 seconds   supine with strap   Quad Stretch Right;Left;2 reps;30 seconds   prone with  strap   Other Knee/Hip Stretches standing trunk ext x 5 sec x 5 reps     Other Knee/Hip Stretches prone on elbows x 5 reps, 10 sec hold;  Cat/cow x 5 reps       Knee/Hip Exercises: Aerobic   Recumbent Bike L2: 5 min       Knee/Hip Exercises: Standing   Forward Lunges Right;Left;1 set   8 reps each leg   Side Lunges Right;Left;1 set;10 reps   hip hinge, back straight, cues to bend knees, core engaged.      Knee/Hip Exercises: Seated   Other Seated Knee/Hip Exercises reviewed body mechanics of sit to/from stand for getting into/out of car (simulating on elevated table) x 5 reps       Knee/Hip Exercises: Supine   Bridges Strengthening;Both;1 set;10 reps   with arm press, core engaged     Knee/Hip Exercises: Prone   Other Prone Exercises opp arm/leg with core engaged x 10 each side.              PT Long Term Goals - 03/15/20 1047      PT LONG TERM GOAL #1   Title Patient to be independent with advanced HEP.    Time 6    Period Weeks    Status On-going      PT LONG TERM GOAL #2   Title Improve LE tissue extensibility demonstrated by increased hamstring flexibility to 70 deg with no pain    Time 6    Period Weeks    Status On-going      PT LONG TERM GOAL #3   Title LE hip extension strength 4/5 to 4+/5    Time 6    Period Weeks    Status Achieved      PT LONG TERM GOAL #4   Title Patient reports return to pickleball at level prior to injury 01/25/20    Time 6    Period Weeks    Status New      PT LONG TERM GOAL #5   Title Improve FOTO to </= 31% limitation    Time 6    Period Weeks    Status Achieved                 Plan - 03/15/20 1049    Clinical Impression Statement FOTO score improved; has met goal.  Hip ext strength has improved; has met strength goal in LE.  Pt reported significant lower back pain reduction between 0-2/10 after performing prone extension exercises.  He tolerated exercises well.  Began working on side and forward lunges with heavy cues  on form to begin improving  his form with pickle ball.  Pt will benefit from continued PT intervention to improved functional mobility with less pain.    Rehab Potential Good    PT Frequency 2x / week    PT Duration 6 weeks    PT Treatment/Interventions ADLs/Self Care Home Management;Aquatic Therapy;Cryotherapy;Electrical Stimulation;Iontophoresis 47m/ml Dexamethasone;Moist Heat;Ultrasound;Gait training;Stair training;Functional mobility training;Therapeutic activities;Therapeutic exercise;Balance training;Neuromuscular re-education;Patient/family education;Manual techniques;Passive range of motion;Dry needling;Taping;Vasopneumatic Device    PT Next Visit Plan progress functional strengthening and sports specific as tolerated.    PT Home Exercise Plan [id number]   Consulted and Agree with Plan of Care Patient           Patient will benefit from skilled therapeutic intervention in order to improve the following deficits and impairments:  Abnormal gait, Decreased range of motion, Increased fascial restricitons, Increased muscle spasms, Pain, Impaired flexibility, Improper body mechanics, Decreased mobility, Decreased strength, Postural dysfunction, Increased edema  Visit Diagnosis: Hamstring strain, left, initial encounter  Other symptoms and signs involving the musculoskeletal system  Weakness generalized  Stiffness of both hip joints     Problem List Patient Active Problem List   Diagnosis Date Noted  . Myofascial pain syndrome, cervical 07/28/2019  . Lateral epicondylitis of right elbow 07/28/2019  . Nontraumatic incomplete tear of right rotator cuff 07/25/2019  . Tendinopathy of right biceps tendon 07/25/2019  . Erectile dysfunction 07/11/2019  . Mild persistent asthma/cough variant asthma 04/13/2019  . Allergic rhinitis with a possible nonallergic component 04/13/2019  . Acid reflux 04/13/2019  . Cough 04/13/2019  . Hoarseness 04/13/2019  . Cervical radicular pain 01/10/2019    . Ulnar neuropathy at elbow of right upper extremity 01/03/2019  . Acute bilateral low back pain without sciatica 01/03/2019  . Onychomycosis 05/25/2018  . Palpitations 07/06/2017  . CAD (coronary artery disease) 07/05/2017  . Family history of early CAD 07/05/2017  . Abnormal findings on diagnostic imaging of cardiovascular system 06/23/2017  . Low vitamin D level 04/22/2017  . Pain in right ankle and joints of right foot 10/05/2016  . Hemarthrosis of right elbow 02/03/2016  . Small thenar eminence 02/03/2016  . Cervical disc disorder with radiculopathy of cervical region 10/07/2015  . Incomplete rotator cuff tear 09/10/2015  . Nonischemic cardiomyopathy ([place] 05/15/2015  . Fatigue 05/15/2015  . Dyspnea on exertion 05/15/2015  . Decreased cardiac ejection fraction 04/02/2015  . Strain of latissimus dorsi muscle 01/21/2015  . Shoulder bursitis 10/12/2014  . Stenosis of lateral recess of lumbar spine 10/12/2014  . BPPV (benign paroxysmal positional vertigo) 08/14/2014  . Hyperlipidemia 01/19/2014  . Anemia, unspecified 01/19/2014  . ADD (attention deficit disorder) 01/19/2014  . Type 2 diabetes mellitus with hyperglycemia, without long-term current use of insulin ([place] 12/22/2013  . Hypogonadism in male 12/22/2013  . Arthritis of hand, left 12/22/2013   JKerin Perna PTA 03/15/20 5:20 PM  [facility] [address] [address]Phone: [phone]  Fax:  [phone] Name: DHELMER DULLMRN: [id number] of Birth: 104-Jun-1957

## 2020-03-20 ENCOUNTER — Other Ambulatory Visit: Payer: Self-pay

## 2020-03-20 ENCOUNTER — Ambulatory Visit (INDEPENDENT_AMBULATORY_CARE_PROVIDER_SITE_OTHER): Payer: 59 | Admitting: Physical Therapy

## 2020-03-20 ENCOUNTER — Encounter: Payer: Self-pay | Admitting: Physical Therapy

## 2020-03-20 DIAGNOSIS — S76312A Strain of muscle, fascia and tendon of the posterior muscle group at thigh level, left thigh, initial encounter: Secondary | ICD-10-CM

## 2020-03-20 DIAGNOSIS — M25652 Stiffness of left hip, not elsewhere classified: Secondary | ICD-10-CM | POA: Diagnosis not present

## 2020-03-20 DIAGNOSIS — M25651 Stiffness of right hip, not elsewhere classified: Secondary | ICD-10-CM

## 2020-03-20 DIAGNOSIS — R531 Weakness: Secondary | ICD-10-CM | POA: Diagnosis not present

## 2020-03-20 DIAGNOSIS — R29898 Other symptoms and signs involving the musculoskeletal system: Secondary | ICD-10-CM | POA: Diagnosis not present

## 2020-03-20 NOTE — Addendum Note (Signed)
Addended by: Everardo All on: 03/20/2020 05:47 PM   Modules accepted: Orders

## 2020-03-20 NOTE — Therapy (Addendum)
[facility] [place], [place], [address] Phone: [phone]   Fax:  [phone]  Physical Therapy Treatment  Patient Details  Name: Reginald Rios MRN: [id number] Date of Birth: 05-Jul-1955 Referring Provider (PT): Dr Lynne Leader    Encounter Date: 03/20/2020   PT End of Session - 03/20/20 1443    Visit Number 9    Number of Visits 20    Date for PT Re-Evaluation 05/08/20    PT Start Time 1435    PT Stop Time 1516    PT Time Calculation (min) 41 min    Activity Tolerance Patient tolerated treatment well    Behavior During Therapy [facility] for tasks assessed/performed           Past Medical History:  Diagnosis Date  . ADD (attention deficit disorder)   . Anemia   . Arthritis   . CAD (coronary artery disease)    2/19 PCI/DESx1 to Lcx  . Diabetes mellitus without complication ([place])   . Exercise-induced asthma   . Heart murmur   . Hyperlipidemia    family hx of high cholesterol  . Hypogonadism in male   . Low back pain   . Osteoarthritis     Past Surgical History:  Procedure Laterality Date  . CORONARY STENT INTERVENTION N/A 06/23/2017   Procedure: CORONARY STENT INTERVENTION;  Surgeon: Leonie Man, MD;  Location: [place] [facility];  Service: Cardiovascular;  Laterality: N/A;  . INTRAVASCULAR PRESSURE WIRE/FFR STUDY N/A 06/23/2017   Procedure: INTRAVASCULAR PRESSURE WIRE/FFR STUDY;  Surgeon: Leonie Man, MD;  Location: [place] [facility];  Service: Cardiovascular;  Laterality: N/A;  . KNEE ARTHROSCOPY    . LEFT HEART CATH AND CORONARY ANGIOGRAPHY N/A 06/23/2017   Procedure: LEFT HEART CATH AND CORONARY ANGIOGRAPHY;  Surgeon: Leonie Man, MD;  Location: [place] [facility];  Service: Cardiovascular;  Laterality: N/A;  . SHOULDER ARTHROSCOPY WITH BICEPSTENOTOMY Right 09/06/2019   Procedure: SHOULDER ARTHROSCOPY WITH BICEPSTENOTOMY;  Surgeon: Leandrew Koyanagi, MD;  Location: [place];  Service: Orthopedics;  Laterality: Right;  . SHOULDER ARTHROSCOPY WITH SUBACROMIAL DECOMPRESSION Right 09/06/2019   Procedure: RIGHT SHOULDER ARTHROSCOPY WITH EXTENSIVE DEBRIDEMENT, SUBACROMIAL DECOMPRESSION, BICEPS TENOTOMY;  Surgeon: Leandrew Koyanagi, MD;  Location: [place];  Service: Orthopedics;  Laterality: Right;  . TENOTOMY ACHILLES TENDON      There were no vitals filed for this visit.   Subjective Assessment - 03/20/20 1501    Subjective Pt reports he was walking the dog yesterday and his Lt leg gave out multiple times.  Since then his Lt hamstring has bothered him.  He has continued to hold off on playing pickle ball, but is anxious to return.    Currently in Pain? Yes    Pain Score 6     Pain Location Buttocks    Pain Orientation Left    Pain Descriptors / Indicators Patsi Sears PT Assessment - 03/20/20 0001      Assessment   Medical Diagnosis Rt hamstring strain     Referring Provider (PT) Dr Lynne Leader     Onset Date/Surgical Date 01/25/20    Hand Dominance Right    Next MD Visit 03/26/20    Prior Therapy yes for neck; shoulder; LB; anle       Observation/Other Assessments   Focus on Therapeutic Outcomes (FOTO)  29% limitation  Strength   Right Hip Extension 4+/5    Left Hip Extension 4/5            [facility] Adult PT Treatment/Exercise - 03/20/20 0001      Self-Care   Other Self-Care Comments  pt educated on self massage with ball to high hamstring with active knee flex/ext; pt returned demo with cues.   Reviewed roller stick to hamstrings.       Knee/Hip Exercises: Stretches   Passive Hamstring Stretch Left;3 reps;Right;2 reps;30 seconds   hip hinge, straight back   Quad Stretch Right;Left;2 reps;30 seconds   prone with strap, rolled towel above knee   Gastroc Stretch Right;Left;1 rep;30 seconds    Other Knee/Hip Stretches standing trunk ext x 5 sec x 2 reps     Other Knee/Hip Stretches prone on elbows x 5 reps, 10  sec hold;  Cat/cow x 5 reps       Knee/Hip Exercises: Aerobic   Recumbent Bike L3: 5 min     Other Aerobic single laps around gym to assess response to exercises.       Knee/Hip Exercises: Standing   Other Standing Knee Exercises split squats with limited depth and UE support on counter x 5 each, cues for upright posture.   Simulated pickle ball with forward lunge and reach with Rt hand to hit light ball x 10 reps with cues for hip hinge and knee flexion (minimal to no pain reported).        Knee/Hip Exercises: Prone   Other Prone Exercises opp arm/leg with core engaged x 10 each side.- difficulty lifting RUE; modified to no RUE lift.                        PT Long Term Goals - 03/20/20 1623      PT LONG TERM GOAL #1   Title Patient to be independent with advanced HEP.    Time 6    Period Weeks    Status On-going    Target Date 05/09/19      PT LONG TERM GOAL #2   Title Improve LE tissue extensibility demonstrated by increased hamstring flexibility to 70 deg with no pain    Time 6    Period Weeks    Status On-going    Target Date 05/08/20      PT LONG TERM GOAL #3   Title LE hip extension strength 4+/5 to 5/5    Time 6    Period Weeks    Status Achieved    Target Date 05/08/20      PT LONG TERM GOAL #4   Title Patient reports return to pickleball at level prior to injury 01/25/20    Time 6    Period Weeks    Status On-going    Target Date 05/08/20      PT LONG TERM GOAL #5   Title Improve FOTO to </= 31% limitation    Time 6    Period Weeks    Status Achieved                 Plan - 03/20/20 1502    Clinical Impression Statement Positive response thus far with extension based exercises and cues for improved body mechanics as well as stretching program for lumbar spine and LE's.  Pt reported 4 point reduction of pain in high Lt hamstring after instruction of self massage (pin+ stretch).  Pt has partially met his goals.  He  will benefit from  continued PT intervention to maximize functional mobility and return to recreational sport.    Rehab Potential Good    PT Frequency 2x / week    PT Duration 6 weeks    PT Treatment/Interventions ADLs/Self Care Home Management;Aquatic Therapy;Cryotherapy;Electrical Stimulation;Iontophoresis 78m/ml Dexamethasone;Moist Heat;Ultrasound;Gait training;Stair training;Functional mobility training;Therapeutic activities;Therapeutic exercise;Balance training;Neuromuscular re-education;Patient/family education;Manual techniques;Passive range of motion;Dry needling;Taping;Vasopneumatic Device    PT Next Visit Plan progress functional strengthening and sports specific as tolerated.    PT Home Exercise Plan [id number]   Consulted and Agree with Plan of Care Patient           Patient will benefit from skilled therapeutic intervention in order to improve the following deficits and impairments:  Abnormal gait, Decreased range of motion, Increased fascial restricitons, Increased muscle spasms, Pain, Impaired flexibility, Improper body mechanics, Decreased mobility, Decreased strength, Postural dysfunction, Increased edema  Visit Diagnosis: Hamstring strain, left, initial encounter - Plan: PT plan of care cert/re-cert  Other symptoms and signs involving the musculoskeletal system - Plan: PT plan of care cert/re-cert  Weakness generalized - Plan: PT plan of care cert/re-cert  Stiffness of both hip joints - Plan: PT plan of care cert/re-cert     Problem List Patient Active Problem List   Diagnosis Date Noted  . Myofascial pain syndrome, cervical 07/28/2019  . Lateral epicondylitis of right elbow 07/28/2019  . Nontraumatic incomplete tear of right rotator cuff 07/25/2019  . Tendinopathy of right biceps tendon 07/25/2019  . Erectile dysfunction 07/11/2019  . Mild persistent asthma/cough variant asthma 04/13/2019  . Allergic rhinitis with a possible nonallergic component 04/13/2019  . Acid reflux  04/13/2019  . Cough 04/13/2019  . Hoarseness 04/13/2019  . Cervical radicular pain 01/10/2019  . Ulnar neuropathy at elbow of right upper extremity 01/03/2019  . Acute bilateral low back pain without sciatica 01/03/2019  . Onychomycosis 05/25/2018  . Palpitations 07/06/2017  . CAD (coronary artery disease) 07/05/2017  . Family history of early CAD 07/05/2017  . Abnormal findings on diagnostic imaging of cardiovascular system 06/23/2017  . Low vitamin D level 04/22/2017  . Pain in right ankle and joints of right foot 10/05/2016  . Hemarthrosis of right elbow 02/03/2016  . Small thenar eminence 02/03/2016  . Cervical disc disorder with radiculopathy of cervical region 10/07/2015  . Incomplete rotator cuff tear 09/10/2015  . Nonischemic cardiomyopathy ([place] 05/15/2015  . Fatigue 05/15/2015  . Dyspnea on exertion 05/15/2015  . Decreased cardiac ejection fraction 04/02/2015  . Strain of latissimus dorsi muscle 01/21/2015  . Shoulder bursitis 10/12/2014  . Stenosis of lateral recess of lumbar spine 10/12/2014  . BPPV (benign paroxysmal positional vertigo) 08/14/2014  . Hyperlipidemia 01/19/2014  . Anemia, unspecified 01/19/2014  . ADD (attention deficit disorder) 01/19/2014  . Type 2 diabetes mellitus with hyperglycemia, without long-term current use of insulin [place] 12/22/2013  . Hypogonadism in male 12/22/2013  . Arthritis of hand, left 12/22/2013   JKerin Perna PTA 03/20/20 5:47 PM  Celyn P. HHelene KelpPT, MPH 03/20/20 5:47 PM   [address] [address]Phone: [phone]  Fax:  [phone] Name: Reginald BIRDWELLMRN: [id number] of Birth: 1May 10, 1957

## 2020-03-21 ENCOUNTER — Encounter: Payer: Self-pay | Admitting: Family Medicine

## 2020-03-22 MED FILL — MONTELUKAST SOD 10 MG TAB: 10 | 90 days supply | Qty: 90 | Fill #2

## 2020-03-25 NOTE — Progress Notes (Signed)
I, [name], LAT, ATC, am serving as scribe for Dr. Lynne Leader.  Reginald Rios is a 64 y.o. male who presents to [place] [facility] today for f/u of B h/s pain.  He was last seen by Dr. Georgina Snell on 02/27/20 and noted improvement in his R h/s w/ new L h/s pain, tightness and cramping and LBP.  He was advised to con't PT for his LBP and B legs and has completed 9 visits.  Since his last visit w/ Dr. Georgina Snell, he states that he re-injured his L hamstring on 03/20/20 and reported feeling a "pop."  Pt states that he injured his L h/s while playing pickleball last Thursday when he planted and turned to the L getting ready to hit the ball.  He felt a pop at the time of injury.  He denies any swelling or bruising but notes tightness in his L post thigh that radiates up to his buttocks.  He has been doing his HEP from PT to include h/s, ITB and quad stretching.and has been alternating between ice and heat.   Additionally he notes his left arm is worsening.  At the last visit about a month ago he was having some left hand and arm pain and numbness associated with some weakness thought to be C6 or C7 cervical radiculopathy.  His symptoms have worsened a bit since the last visit with worsening weakness in the left arm associated with more numbness and more pain.  Symptoms seem to be focused more on the second and third digits of the left hand and dorsal forearm.    He had a trial off of his statin and back on it again did not feel any different off of statin and on statin.   Pertinent review of systems: No fevers or chills  Relevant historical information: History cervical nerve compression and radiculopathy in the past..   Exam:  BP 118/68 (BP Location: Left Arm, Patient Position: Sitting, Cuff Size: Normal)   Pulse 87   Ht 5\' 10"  (1.778 m)   Wt 194 lb 6.4 oz (88.2 kg)   SpO2 97%   BMI 27.89 kg/m  General: Well Developed, well nourished, and in no acute distress.    MSK: C-spine normal.  Nontender normal motion.  Upper extremity strength decreased left triceps extension strength 3+/5 otherwise normal strength left arm. Reflexes intact. Sensation intact.  L-spine decreased lumbar motion. Lower extremity strength intact. Positive left-sided straight leg raise test. Hamstrings bilaterally mildly tender palpation ischial tuberosity.  Abnormal left-sided H test normal right. No defects palpated hamstrings bilaterally.   Lab and Radiology Results  DG Cervical Spine Complete  Result Date: 03/01/2020 CLINICAL DATA:  Chronic neck pain. EXAM: CERVICAL SPINE - COMPLETE 4+ VIEW COMPARISON:  August 23, 2018 FINDINGS: There is no evidence of cervical spine fracture or prevertebral soft tissue swelling. A radiopaque fusion plate and screws are seen along the anterior aspect of the C4, C5 and C6 vertebral bodies. Approximately 2 mm to 3 mm anterolisthesis of the C6 vertebral body is seen on C7. Moderate severity endplate sclerosis is noted at the level of C6-C7. No other significant bone abnormalities are identified. IMPRESSION: 1. Prior fusion of C4, C5 and C6 vertebral bodies. 2. 2 mm anterolisthesis of C6 on C7. 3. Moderate severity degenerative disc disease at C6-C7. Electronically Signed   By: Virgina Norfolk M.D.   On: 03/01/2020 00:44   [place] LIMITED JOINT SPACE STRUCTURES LOW LEFT(NO LINKED CHARGES)  Result  Date: 03/14/2020 Did not complete US    EXAM: MRI CERVICAL SPINE WITHOUT CONTRAST  TECHNIQUE: Multiplanar, multisequence MR imaging of the cervical spine was performed. No intravenous contrast was administered.  COMPARISON:  02/28/2016  FINDINGS: Alignment: Physiologic.  Vertebrae: No fracture, evidence of discitis, or bone lesion.  Cord: Mild T2 hyperintense signal at C4-5 and C5-6 likely reflecting mild myelomalacia.  Posterior Fossa, vertebral arteries, paraspinal tissues: Posterior fossa demonstrates no focal abnormality.  Vertebral artery flow voids are maintained. Paraspinal soft tissues are unremarkable.  Disc levels:  Discs: Anterior cervical fusion from C4 through C6. Degenerative disc disease with disc height loss at C6-7.  C2-3: No significant disc bulge. Moderate left facet arthropathy. Mild left foraminal stenosis. No central canal stenosis.  C3-4: No significant disc bulge. Moderate left facet arthropathy and left uncovertebral degenerative changes with severe left foraminal stenosis. No central canal stenosis.  C4-5: Interbody fusion. Moderate bilateral foraminal stenosis. No central canal stenosis.  C5-6: Interbody fusion. No neural foraminal stenosis. No central canal stenosis.  C6-7: Broad-based disc bulge. Mild bilateral facet arthropathy. No neural foraminal stenosis. No central canal stenosis.  C7-T1: Broad-based disc bulge. No neural foraminal stenosis. No central canal stenosis.  IMPRESSION: 1.  No acute osseous injury of the cervical spine. 2. Anterior cervical fusion from C4 through C6. Moderate bilateral foraminal stenosis at C4-5. Mild T2 hyperintense signal at C4-5 and C5-6 likely reflecting mild myelomalacia. 3. At C3-4 there is moderate left facet arthropathy and left uncovertebral degenerative changes. Severe left foraminal stenosis.   Electronically Signed   By: Kathreen Devoid   On: 09/04/2018 15:25  I, Lynne Leader, personally (independently) visualized and performed the interpretation of the images attached in this note.   Assessment and Plan: 64 y.o. male with hamstring strain new left hip.  This visit occurs in the setting of chronic right hamstring pain and previous history left hamstring strain.  Currently receiving physical therapy.  Doubtful for severe hamstring tear, current pain most likely due to strain.  Plan to continue PT and modification of activity.  New PT order today.  Worsening left arm pain and numbness thought to be worsening C7  radiculopathy.  History of prior bulging disc at this level.  Cervical spine MRI last year.  Plan for repeat MRI given worsening neurologic symptoms.  Recheck following MRI.    Orders Placed This Encounter  Procedures  . MR CERVICAL SPINE WO CONTRAST    Standing Status:   Future    Standing Expiration Date:   03/26/2021    Order Specific Question:   What is the patient's sedation requirement?    Answer:   No Sedation    Order Specific Question:   Does the patient have a pacemaker or implanted devices?    Answer:   No    Order Specific Question:   Preferred imaging location?    Answer:   Product/process development scientist (table limit-350lbs)  . Ambulatory referral to Physical Therapy    Referral Priority:   Routine    Referral Type:   Physical Medicine    Referral Reason:   Specialty Services Required    Requested Specialty:   Physical Therapy   No orders of the defined types were placed in this encounter.    Discussed warning signs or symptoms. Please see discharge instructions. Patient expresses understanding.   The above documentation has been reviewed and is accurate and complete Lynne Leader, M.D.

## 2020-03-26 ENCOUNTER — Ambulatory Visit (INDEPENDENT_AMBULATORY_CARE_PROVIDER_SITE_OTHER): Payer: 59 | Admitting: Family Medicine

## 2020-03-26 ENCOUNTER — Encounter: Payer: Self-pay | Admitting: Family Medicine

## 2020-03-26 ENCOUNTER — Ambulatory Visit: Payer: Self-pay

## 2020-03-26 ENCOUNTER — Other Ambulatory Visit: Payer: Self-pay

## 2020-03-26 VITALS — BP 118/68 | HR 87 | Ht 70.0 in | Wt 194.4 lb

## 2020-03-26 DIAGNOSIS — S76309D Unspecified injury of muscle, fascia and tendon of the posterior muscle group at thigh level, unspecified thigh, subsequent encounter: Secondary | ICD-10-CM

## 2020-03-26 DIAGNOSIS — M5412 Radiculopathy, cervical region: Secondary | ICD-10-CM | POA: Diagnosis not present

## 2020-03-26 DIAGNOSIS — M79652 Pain in left thigh: Secondary | ICD-10-CM

## 2020-03-26 NOTE — Patient Instructions (Addendum)
Thank you for coming in today.  Keep up the exercises and PT.   You should hear from MRI scheduling within 1 week. If you do not hear please let me know.   Recheck after MRI.

## 2020-03-27 ENCOUNTER — Ambulatory Visit (INDEPENDENT_AMBULATORY_CARE_PROVIDER_SITE_OTHER): Payer: 59 | Admitting: Physical Therapy

## 2020-03-27 ENCOUNTER — Encounter: Payer: Self-pay | Admitting: Physical Therapy

## 2020-03-27 ENCOUNTER — Other Ambulatory Visit: Payer: Self-pay

## 2020-03-27 DIAGNOSIS — M25651 Stiffness of right hip, not elsewhere classified: Secondary | ICD-10-CM

## 2020-03-27 DIAGNOSIS — R531 Weakness: Secondary | ICD-10-CM | POA: Diagnosis not present

## 2020-03-27 DIAGNOSIS — R29898 Other symptoms and signs involving the musculoskeletal system: Secondary | ICD-10-CM | POA: Diagnosis not present

## 2020-03-27 DIAGNOSIS — S76312A Strain of muscle, fascia and tendon of the posterior muscle group at thigh level, left thigh, initial encounter: Secondary | ICD-10-CM | POA: Diagnosis not present

## 2020-03-27 DIAGNOSIS — M25652 Stiffness of left hip, not elsewhere classified: Secondary | ICD-10-CM

## 2020-03-27 NOTE — Therapy (Signed)
[place], [place], [address] Phone: [phone]   Fax:  [phone]  Physical Therapy Treatment  Patient Details  Name: Reginald Rios MRN: [id number] Date of Birth: [birth year] Referring Provider (PT): Dr Lynne Leader    Encounter Date: 03/27/2020   PT End of Session - 03/27/20 1359    Visit Number 10    Number of Visits 20    Date for PT Re-Evaluation 05/08/20    PT Start Time 1350    PT Stop Time 1436    PT Time Calculation (min) 46 min    Activity Tolerance Patient tolerated treatment well    Behavior During Therapy [facility] for tasks assessed/performed           Past Medical History:  Diagnosis Date  . ADD (attention deficit disorder)   . Anemia   . Arthritis   . CAD (coronary artery disease)    2/19 PCI/DESx1 to Lcx  . Diabetes mellitus without complication ([place])   . Exercise-induced asthma   . Heart murmur   . Hyperlipidemia    family hx of high cholesterol  . Hypogonadism in male   . Low back pain   . Osteoarthritis     Past Surgical History:  Procedure Laterality Date  . CORONARY STENT INTERVENTION N/A 06/23/2017   Procedure: CORONARY STENT INTERVENTION;  Surgeon: Leonie Man, MD;  Location: [place] [facility];  Service: Cardiovascular;  Laterality: N/A;  . INTRAVASCULAR PRESSURE WIRE/FFR STUDY N/A 06/23/2017   Procedure: INTRAVASCULAR PRESSURE WIRE/FFR STUDY;  Surgeon: Leonie Man, MD;  Location: [place] [facility];  Service: Cardiovascular;  Laterality: N/A;  . KNEE ARTHROSCOPY    . LEFT HEART CATH AND CORONARY ANGIOGRAPHY N/A 06/23/2017   Procedure: LEFT HEART CATH AND CORONARY ANGIOGRAPHY;  Surgeon: Leonie Man, MD;  Location: [facility];  Service: Cardiovascular;  Laterality: N/A;  . SHOULDER ARTHROSCOPY WITH BICEPSTENOTOMY Right 09/06/2019   Procedure: SHOULDER ARTHROSCOPY WITH BICEPSTENOTOMY;  Surgeon: Leandrew Koyanagi, MD;  Location: [place];  Service: Orthopedics;  Laterality: Right;  . SHOULDER ARTHROSCOPY WITH SUBACROMIAL DECOMPRESSION Right 09/06/2019   Procedure: RIGHT SHOULDER ARTHROSCOPY WITH EXTENSIVE DEBRIDEMENT, SUBACROMIAL DECOMPRESSION, BICEPS TENOTOMY;  Surgeon: Leandrew Koyanagi, MD;  Location: [place];  Service: Orthopedics;  Laterality: Right;  . TENOTOMY ACHILLES TENDON      There were no vitals filed for this visit.   Subjective Assessment - 03/27/20 1400    Subjective Pt reports he hurt himself playing pickle ball last week.  He complains of continued stiffness in LE and hips. He followed up with MD; is to continue PT for LE and will have MRI of neck.    Currently in Pain? Yes    Pain Score 7     Pain Location Leg    Pain Orientation Left    Pain Descriptors / Indicators Sore    Pain Radiating Towards to knee    Aggravating Factors  bending over    Pain Relieving Factors stretching, hot shower              [facility] PT Assessment - 03/27/20 0001      Assessment   Medical Diagnosis Rt hamstring strain     Referring Provider (PT) Dr Lynne Leader     Onset Date/Surgical Date 01/25/20    Hand Dominance Right    Next MD Visit after neck MRI    Prior Therapy yes for  neck; shoulder; LB; ankle              [facility] Adult PT Treatment/Exercise - 03/27/20 0001      Self-Care   Other Self-Care Comments  reviewed self massage with ball to high hamstring with active knee flex/ext; pt returned demo with cues (pin and stretch)      Knee/Hip Exercises: Stretches   Active Hamstring Stretch Right;Left   dynamic leg swings x 5 each side with UE support on counter.   Passive Hamstring Stretch Right;Left;2 reps;3 reps;30 seconds   supine with strap; knee flexed and straight.    Piriformis Stretch Left;3 reps;Right;2 reps;20 seconds    Gastroc Stretch Right;Left;1 rep;20 seconds    Other Knee/Hip Stretches standing adductor stretch with forearms on counter x 10 sec x 5 reps each direction     Other  Knee/Hip Stretches prone on elbows x  3reps, 20 sec hold;  Cat/cow x 5 reps       Knee/Hip Exercises: Aerobic   Recumbent Bike L1-2 x 5.5 min for warm up.       Knee/Hip Exercises: Standing   Heel Raises Both;1 set;10 reps    Forward Lunges Limitations forward step up onto 12" step x 10 reps each leg with UE support on rails (no pain).       Knee/Hip Exercises: Supine   Bridges Strengthening;Both;1 set;10 reps   with arm press, core engaged. 5 sec hold in ext     Knee/Hip Exercises: Prone   Straight Leg Raises Strengthening;Right;Left;1 set;10 reps    Straight Leg Raises Limitations some twinging in Lt lowback              PT Long Term Goals - 03/20/20 1623      PT LONG TERM GOAL #1   Title Patient to be independent with advanced HEP.    Time 6    Period Weeks    Status On-going    Target Date 05/09/19      PT LONG TERM GOAL #2   Title Improve LE tissue extensibility demonstrated by increased hamstring flexibility to 70 deg with no pain    Time 6    Period Weeks    Status On-going    Target Date 05/08/20      PT LONG TERM GOAL #3   Title LE hip extension strength 4+/5 to 5/5    Time 6    Period Weeks    Status Achieved    Target Date 05/08/20      PT LONG TERM GOAL #4   Title Patient reports return to pickleball at level prior to injury 01/25/20    Time 6    Period Weeks    Status On-going    Target Date 05/08/20      PT LONG TERM GOAL #5   Title Improve FOTO to </= 31% limitation    Time 6    Period Weeks    Status Achieved                 Plan - 03/27/20 1409    Clinical Impression Statement Pt reported reduction of Lt hamstring pain from 7/10 to 2/10 after warm up on bicycle.  Further reduction with other stretches in supine and self release with ball.  Trial of loading hamstring within tolerance (step ups, bridges, hip ext), but pt reported high Lt hamstring tightening back up; repeated stretch with good improvement. Encouraged consistent  self-care and stretch (as previously taught over many sessions) as  well as things to avoid.  Pt making gradual progress towards remaining goals.    Rehab Potential Good    PT Frequency 2x / week    PT Duration 6 weeks    PT Treatment/Interventions ADLs/Self Care Home Management;Aquatic Therapy;Cryotherapy;Electrical Stimulation;Iontophoresis 4mg /ml Dexamethasone;Moist Heat;Ultrasound;Gait training;Stair training;Functional mobility training;Therapeutic activities;Therapeutic exercise;Balance training;Neuromuscular re-education;Patient/family education;Manual techniques;Passive range of motion;Dry needling;Taping;Vasopneumatic Device    PT Next Visit Plan progress functional strengthening and sports specific as tolerated.    PT Home Exercise Plan [id number]    Consulted and Agree with Plan of Care Patient           Patient will benefit from skilled therapeutic intervention in order to improve the following deficits and impairments:  Abnormal gait, Decreased range of motion, Increased fascial restricitons, Increased muscle spasms, Pain, Impaired flexibility, Improper body mechanics, Decreased mobility, Decreased strength, Postural dysfunction, Increased edema  Visit Diagnosis: Hamstring strain, left, initial encounter  Other symptoms and signs involving the musculoskeletal system  Weakness generalized  Stiffness of both hip joints     Problem List Patient Active Problem List   Diagnosis Date Noted  . Myofascial pain syndrome, cervical 07/28/2019  . Lateral epicondylitis of right elbow 07/28/2019  . Nontraumatic incomplete tear of right rotator cuff 07/25/2019  . Tendinopathy of right biceps tendon 07/25/2019  . Erectile dysfunction 07/11/2019  . Mild persistent asthma/cough variant asthma 04/13/2019  . Allergic rhinitis with a possible nonallergic component 04/13/2019  . Acid reflux 04/13/2019  . Cough 04/13/2019  . Hoarseness 04/13/2019  . Cervical radicular pain 01/10/2019  .  Ulnar neuropathy at elbow of right upper extremity 01/03/2019  . Acute bilateral low back pain without sciatica 01/03/2019  . Onychomycosis 05/25/2018  . Palpitations 07/06/2017  . CAD (coronary artery disease) 07/05/2017  . Family history of early CAD 07/05/2017  . Abnormal findings on diagnostic imaging of cardiovascular system 06/23/2017  . Low vitamin D level 04/22/2017  . Pain in right ankle and joints of right foot 10/05/2016  . Hemarthrosis of right elbow 02/03/2016  . Small thenar eminence 02/03/2016  . Cervical disc disorder with radiculopathy of cervical region 10/07/2015  . Incomplete rotator cuff tear 09/10/2015  . Nonischemic cardiomyopathy ([place]) 05/15/2015  . Fatigue 05/15/2015  . Dyspnea on exertion 05/15/2015  . Decreased cardiac ejection fraction 04/02/2015  . Strain of latissimus dorsi muscle 01/21/2015  . Shoulder bursitis 10/12/2014  . Stenosis of lateral recess of lumbar spine 10/12/2014  . BPPV (benign paroxysmal positional vertigo) 08/14/2014  . Hyperlipidemia 01/19/2014  . Anemia, unspecified 01/19/2014  . ADD (attention deficit disorder) 01/19/2014  . Type 2 diabetes mellitus with hyperglycemia, without long-term current use of insulin ([place]) 12/22/2013  . Hypogonadism in male 12/22/2013  . Arthritis of hand, left 12/22/2013   Kerin Perna, PTA 03/27/20 3:02 PM  [place], [place], [address] Phone: [phone]   Fax:  [phone]  Name: Reginald Rios MRN: [id number] Date of Birth: 06-23-1955

## 2020-04-01 ENCOUNTER — Other Ambulatory Visit: Payer: Self-pay

## 2020-04-01 ENCOUNTER — Ambulatory Visit (INDEPENDENT_AMBULATORY_CARE_PROVIDER_SITE_OTHER): Payer: 59 | Admitting: Physical Therapy

## 2020-04-01 ENCOUNTER — Encounter: Payer: Self-pay | Admitting: Physical Therapy

## 2020-04-01 DIAGNOSIS — S76312A Strain of muscle, fascia and tendon of the posterior muscle group at thigh level, left thigh, initial encounter: Secondary | ICD-10-CM

## 2020-04-01 DIAGNOSIS — R531 Weakness: Secondary | ICD-10-CM | POA: Diagnosis not present

## 2020-04-01 DIAGNOSIS — R29898 Other symptoms and signs involving the musculoskeletal system: Secondary | ICD-10-CM

## 2020-04-01 NOTE — Therapy (Signed)
[place], [place], [address] Phone: [phone]   Fax:  [phone]  Physical Therapy Treatment  Patient Details  Name: Reginald Rios MRN: [id number] Date of Birth: October 04, 1955 Referring Provider (PT): Dr Lynne Leader    Encounter Date: 04/01/2020   PT End of Session - 04/01/20 1106    Visit Number 11    Number of Visits 20    Date for PT Re-Evaluation 05/08/20    PT Start Time 1104    PT Stop Time 1149    PT Time Calculation (min) 45 min    Activity Tolerance Patient tolerated treatment well    Behavior During Therapy [facility] for tasks assessed/performed           Past Medical History:  Diagnosis Date  . ADD (attention deficit disorder)   . Anemia   . Arthritis   . CAD (coronary artery disease)    2/19 PCI/DESx1 to Lcx  . Diabetes mellitus without complication ([place])   . Exercise-induced asthma   . Heart murmur   . Hyperlipidemia    family hx of high cholesterol  . Hypogonadism in male   . Low back pain   . Osteoarthritis     Past Surgical History:  Procedure Laterality Date  . CORONARY STENT INTERVENTION N/A 06/23/2017   Procedure: CORONARY STENT INTERVENTION;  Surgeon: Leonie Man, MD;  Location: [place] [facility];  Service: Cardiovascular;  Laterality: N/A;  . INTRAVASCULAR PRESSURE WIRE/FFR STUDY N/A 06/23/2017   Procedure: INTRAVASCULAR PRESSURE WIRE/FFR STUDY;  Surgeon: Leonie Man, MD;  Location: [place] [facility];  Service: Cardiovascular;  Laterality: N/A;  . KNEE ARTHROSCOPY    . LEFT HEART CATH AND CORONARY ANGIOGRAPHY N/A 06/23/2017   Procedure: LEFT HEART CATH AND CORONARY ANGIOGRAPHY;  Surgeon: Leonie Man, MD;  Location: [place] [facility];  Service: Cardiovascular;  Laterality: N/A;  . SHOULDER ARTHROSCOPY WITH BICEPSTENOTOMY Right 09/06/2019   Procedure: SHOULDER ARTHROSCOPY WITH BICEPSTENOTOMY;  Surgeon: Leandrew Koyanagi, MD;  Location: [place];  Service: Orthopedics;  Laterality: Right;  . SHOULDER ARTHROSCOPY WITH SUBACROMIAL DECOMPRESSION Right 09/06/2019   Procedure: RIGHT SHOULDER ARTHROSCOPY WITH EXTENSIVE DEBRIDEMENT, SUBACROMIAL DECOMPRESSION, BICEPS TENOTOMY;  Surgeon: Leandrew Koyanagi, MD;  Location: [place];  Service: Orthopedics;  Laterality: Right;  . TENOTOMY ACHILLES TENDON      There were no vitals filed for this visit.   Subjective Assessment - 04/01/20 1106    Subjective Pt reports he played pickle ball yesterday.  He did his exercises prior to game, so his pain in Lt hamstring was 1/10. When he finished 3 games, with pain up to 5/10.  He used heat and massaged his hamstring and this reduced pain.  He has been working on bending knees more during game instead of bending back only.    Currently in Pain? Yes    Pain Score 4     Pain Location Leg    Pain Orientation Left;Upper    Pain Descriptors / Indicators Sore    Aggravating Factors  bending over    Pain Relieving Factors stretching, heat              [facility] PT Assessment - 04/01/20 0001      Assessment   Medical Diagnosis Rt hamstring strain     Referring Provider (PT) Dr Lynne Leader     Onset Date/Surgical Date 01/25/20    Hand Dominance Right  Next MD Visit after neck MRI    Prior Therapy yes for neck; shoulder; LB; anle             [facility] Adult PT Treatment/Exercise - 04/01/20 0001      Knee/Hip Exercises: Stretches   Passive Hamstring Stretch Right;Left;20 seconds;3 reps   foot on 12" step.  knee straight, knee bent.    Gastroc Stretch Right;Left;2 reps;20 seconds    Other Knee/Hip Stretches standing adductor stretch with forearms on counter x 30 sec x 2 reps each direction     Other Knee/Hip Stretches trunk ext x 5 sec x 3 reps      Knee/Hip Exercises: Aerobic   Recumbent Bike L1: 5.5 min for warm up.     Other Aerobic single laps around gym to assess response to exercises.       Knee/Hip Exercises: Standing    Forward Step Up Left;Right;1 set;10 reps;Hand Hold: 1;Step Height: 8"    SLS SLS on blue pad with opp leg toe taps front, side, back.  2 sets - 2nd with trial of mini squat and UE support    Other Standing Knee Exercises SLS forward leans to touch chair arms x 5 each leg (challenging)       Knee/Hip Exercises: Seated   Sit to Sand 10 reps;without UE support   black mat, arms forward, core engaged. back straight     Manual Therapy   Soft tissue mobilization deep STM through Lt hamstring; cross fiber friction to high hamstring                        PT Long Term Goals - 03/20/20 1623      PT LONG TERM GOAL #1   Title Patient to be independent with advanced HEP.    Time 6    Period Weeks    Status On-going    Target Date 05/09/19      PT LONG TERM GOAL #2   Title Improve LE tissue extensibility demonstrated by increased hamstring flexibility to 70 deg with no pain    Time 6    Period Weeks    Status On-going    Target Date 05/08/20      PT LONG TERM GOAL #3   Title LE hip extension strength 4+/5 to 5/5    Time 6    Period Weeks    Status Achieved    Target Date 05/08/20      PT LONG TERM GOAL #4   Title Patient reports return to pickleball at level prior to injury 01/25/20    Time 6    Period Weeks    Status On-going    Target Date 05/08/20      PT LONG TERM GOAL #5   Title Improve FOTO to </= 31% limitation    Time 6    Period Weeks    Status Achieved                 Plan - 04/01/20 1139    Clinical Impression Statement Pt reported reduction of pain in low back with exercises, and a shift of sharp to dull pain in high Lt hamstring. Pt challenged in balance and motion with SLS exercises; may want to add to HEP in future.  Cross fiber friction to high Lt hamstring reduced pain to 1/10 at end of session (palpable banding noted in this area).  Pt making gradual progress towards remaining goals.    Rehab Potential  Good    PT Frequency 2x / week    PT  Duration 6 weeks    PT Treatment/Interventions ADLs/Self Care Home Management;Aquatic Therapy;Cryotherapy;Electrical Stimulation;Iontophoresis 4mg /ml Dexamethasone;Moist Heat;Ultrasound;Gait training;Stair training;Functional mobility training;Therapeutic activities;Therapeutic exercise;Balance training;Neuromuscular re-education;Patient/family education;Manual techniques;Passive range of motion;Dry needling;Taping;Vasopneumatic Device    PT Next Visit Plan progress functional strengthening and sports specific as tolerated.    PT Home Exercise Plan [id number]    Consulted and Agree with Plan of Care Patient           Patient will benefit from skilled therapeutic intervention in order to improve the following deficits and impairments:  Abnormal gait, Decreased range of motion, Increased fascial restricitons, Increased muscle spasms, Pain, Impaired flexibility, Improper body mechanics, Decreased mobility, Decreased strength, Postural dysfunction, Increased edema  Visit Diagnosis: Hamstring strain, left, initial encounter  Other symptoms and signs involving the musculoskeletal system  Weakness generalized     Problem List Patient Active Problem List   Diagnosis Date Noted  . Myofascial pain syndrome, cervical 07/28/2019  . Lateral epicondylitis of right elbow 07/28/2019  . Nontraumatic incomplete tear of right rotator cuff 07/25/2019  . Tendinopathy of right biceps tendon 07/25/2019  . Erectile dysfunction 07/11/2019  . Mild persistent asthma/cough variant asthma 04/13/2019  . Allergic rhinitis with a possible nonallergic component 04/13/2019  . Acid reflux 04/13/2019  . Cough 04/13/2019  . Hoarseness 04/13/2019  . Cervical radicular pain 01/10/2019  . Ulnar neuropathy at elbow of right upper extremity 01/03/2019  . Acute bilateral low back pain without sciatica 01/03/2019  . Onychomycosis 05/25/2018  . Palpitations 07/06/2017  . CAD (coronary artery disease) 07/05/2017  . Family  history of early CAD 07/05/2017  . Abnormal findings on diagnostic imaging of cardiovascular system 06/23/2017  . Low vitamin D level 04/22/2017  . Pain in right ankle and joints of right foot 10/05/2016  . Hemarthrosis of right elbow 02/03/2016  . Small thenar eminence 02/03/2016  . Cervical disc disorder with radiculopathy of cervical region 10/07/2015  . Incomplete rotator cuff tear 09/10/2015  . Nonischemic cardiomyopathy ([place]) 05/15/2015  . Fatigue 05/15/2015  . Dyspnea on exertion 05/15/2015  . Decreased cardiac ejection fraction 04/02/2015  . Strain of latissimus dorsi muscle 01/21/2015  . Shoulder bursitis 10/12/2014  . Stenosis of lateral recess of lumbar spine 10/12/2014  . BPPV (benign paroxysmal positional vertigo) 08/14/2014  . Hyperlipidemia 01/19/2014  . Anemia, unspecified 01/19/2014  . ADD (attention deficit disorder) 01/19/2014  . Type 2 diabetes mellitus with hyperglycemia, without long-term current use of insulin ([place]) 12/22/2013  . Hypogonadism in male 12/22/2013  . Arthritis of hand, left 12/22/2013   Kerin Perna, PTA 04/01/20 12:45 PM  [place], [place], [address] Phone: [phone]   Fax:  [phone]  Name: Reginald Rios MRN: [id number] Date of Birth: 12/11/1955

## 2020-04-04 ENCOUNTER — Encounter: Payer: 59 | Admitting: Physical Therapy

## 2020-04-06 ENCOUNTER — Ambulatory Visit (INDEPENDENT_AMBULATORY_CARE_PROVIDER_SITE_OTHER): Payer: 59

## 2020-04-06 ENCOUNTER — Other Ambulatory Visit: Payer: Self-pay

## 2020-04-06 DIAGNOSIS — M4802 Spinal stenosis, cervical region: Secondary | ICD-10-CM | POA: Diagnosis not present

## 2020-04-06 DIAGNOSIS — M5023 Other cervical disc displacement, cervicothoracic region: Secondary | ICD-10-CM

## 2020-04-06 DIAGNOSIS — R200 Anesthesia of skin: Secondary | ICD-10-CM | POA: Diagnosis not present

## 2020-04-06 DIAGNOSIS — M50123 Cervical disc disorder at C6-C7 level with radiculopathy: Secondary | ICD-10-CM | POA: Diagnosis not present

## 2020-04-06 DIAGNOSIS — G9589 Other specified diseases of spinal cord: Secondary | ICD-10-CM | POA: Diagnosis not present

## 2020-04-06 DIAGNOSIS — M5412 Radiculopathy, cervical region: Secondary | ICD-10-CM

## 2020-04-08 DIAGNOSIS — N528 Other male erectile dysfunction: Secondary | ICD-10-CM | POA: Diagnosis not present

## 2020-04-08 MED FILL — TESTOSTERONE 50 MG/5GM (1%): 50 MG/5GM | 30 days supply | Qty: 300 | Fill #4

## 2020-04-08 NOTE — Progress Notes (Signed)
MRI cervical spine shows stable appearing fusion at C4-C6.  There is a bulging disc at C7-T1 but no left-sided stenosis.  However the images are a bit blurry because of the hardware.  Recommend return to clinic to discussed options.  Options would include trial of epidural steroid injection versus CT myelogram to look more accurately.

## 2020-04-09 NOTE — Progress Notes (Unsigned)
I, [name], LAT, ATC, am serving as scribe for Dr. Lynne Leader.  Reginald Rios is a 64 y.o. male who presents to [place] [facility] today for f/u of worsening L UE pain and c-spine MRI.  He was last seen by Dr. Georgina Snell on 03/26/20 for his B hamstrings and worsening L UE pain and paresthesias and a c-spine MRI was ordered.  He was advised to con't PT for his h/s symptoms.  Since his last visit, pt reports that his L UE symptoms remain about the same.  He states that he is getting the majority of his L UE pain from his L upper arm to his elbow.  Of note, he con't to do PT for his B hamstrings.  Diagnostic testing: C-spine MRI- 04/06/20; c-spine XR- 02/27/20   Pertinent review of systems: No fever or chills.    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  History cervical fusion C4-C6   Exam:  BP 120/70 (BP Location: Right Arm, Patient Position: Sitting, Cuff Size: Normal)   Pulse 92   Ht 5\' 10"  (1.778 m)   Wt 192 lb 3.2 oz (87.2 kg)   SpO2 97%   BMI 27.58 kg/m  General: Well Developed, well nourished, and in no acute distress.   MSK: C-spine normal-appearing nontender midline decreased cervical motion. Left hand normal-appearing nontender normal motion and strength.  Mildly positive Tinel's left wrist.    Lab and Radiology Results No results found for this or any previous visit (from the past 72 hour(s)). MR CERVICAL SPINE WO CONTRAST  Result Date: 04/07/2020 CLINICAL DATA:  Cervical radiculopathy, left arm radiculopathy and numbness of the hand. Prior surgery in 2019. EXAM: MRI CERVICAL SPINE WITHOUT CONTRAST TECHNIQUE: Multiplanar, multisequence MR imaging of the cervical spine was performed. No intravenous contrast was administered. COMPARISON:  09/04/2018 FINDINGS: Alignment: 2 mm anterolisthesis of C7 on T1. Vertebrae: No fracture, evidence of discitis, or bone lesion. Cord: Mild T2  hyperintense signal in the cervical spinal cord at the level of C4-5 and C5-6 consistent with stable myelomalacia. Posterior Fossa, vertebral arteries, paraspinal tissues: Posterior fossa demonstrates no focal abnormality. Vertebral artery flow voids are maintained. Paraspinal soft tissues are unremarkable. Disc levels: Discs: Degenerative disease with disc height loss at C6-7 and C7-T1. Anterior cervical fusion from C4 through C6. C2-3: No significant disc bulge. Severe left facet arthropathy. Severe left foraminal stenosis. No right foraminal stenosis. No central canal stenosis. C3-4: Broad-based disc bulge. Severe left facet arthropathy and left uncovertebral degenerative changes. Severe left foraminal stenosis. No significant right foraminal stenosis. No central canal stenosis. C4-5: Interbody fusion. Moderate right and mild left foraminal stenosis. No central canal stenosis. C5-6: Interbody fusion. No neural foraminal stenosis. No central canal stenosis. C6-7: Broad-based disc bulge. Moderate bilateral facet arthropathy. Mild bilateral foraminal stenosis. No central canal stenosis. C7-T1: Broad-based disc bulge. Moderate bilateral facet arthropathy. Mild right foraminal stenosis. No left foraminal stenosis. No central canal stenosis. IMPRESSION: 1. Diffuse cervical spine spondylosis as described above. 2. Anterior cervical fusion from C4 through C6. Moderate right and mild left foraminal narrowing at C4-5. No foraminal or central canal stenosis at C5-6. Stable myelomalacia at C4-5 and C5-6. 3. No acute osseous injury of the cervical spine. Electronically Signed   By: Kathreen Devoid   On: 04/07/2020 10:32  I, Lynne Leader, personally (independently) visualized and performed the interpretation of the images attached in this note.      Assessment and  Plan: 64 y.o. male with left arm paresthesias and pain thought originally to be C7 radiculopathy.  Based on MRI findings showing only mild C7 neuroforaminal  narrowing and now symptoms are more consistent with carpal tunnel syndrome I'm less certain of the diagnosis..  It is very likely that he has a bit of both cervical radiculopathy or even myelopathy and carpal tunnel syndrome.  After discussion with patient plan for epidural steroid injection, and evaluation for carpal tunnel syndrome with nerve conduction study.  Additionally will use night wrist splint.  Check back following nerve conduction study.   PDMP not reviewed this encounter. Orders Placed This Encounter  Procedures  . DG INJECT DIAG/THERA/INC NEEDLE/CATH/PLC EPI/CERV/THOR W/IMG    CERV EPI #1  LEFT C7 UMR ([facility]) PACS (04/06/20) 192 LBS *PLAVIX-HOLD FOR 5 DAYS PRIOR/OTC 12/7/21FAX [place] LETTER TO DR.B.MUNLEY/CLC    Standing Status:   Future    Standing Expiration Date:   04/10/2021    Order Specific Question:   Reason for Exam (SYMPTOM  OR DIAGNOSIS REQUIRED)    Answer:   Left C7  Level and technique per radiology    Order Specific Question:   Preferred Imaging Location?    Answer:   [id number]  . Ambulatory referral to Neurology    Referral Priority:   Routine    Referral Type:   Consultation    Referral Reason:   Specialty Services Required    Requested Specialty:   Neurology    Number of Visits Requested:   1  . NCV with EMG(electromyography)    Standing Status:   Future    Standing Expiration Date:   04/10/2021    Order Specific Question:   Where should this test be performed?    Answer:   [facility]   No orders of the defined types were placed in this encounter.    Discussed warning signs or symptoms. Please see discharge instructions. Patient expresses understanding.   The above documentation has been reviewed and is accurate and complete Lynne Leader, M.D. Total encounter time 20 minutes including face-to-face time with the patient and, reviewing past medical record, and charting on the date of service.   Discussed MRI results and plan

## 2020-04-10 ENCOUNTER — Other Ambulatory Visit: Payer: Self-pay | Admitting: Cardiology

## 2020-04-10 ENCOUNTER — Encounter: Payer: Self-pay | Admitting: Family Medicine

## 2020-04-10 ENCOUNTER — Ambulatory Visit: Payer: 59 | Admitting: Family Medicine

## 2020-04-10 ENCOUNTER — Encounter: Payer: Self-pay | Admitting: Neurology

## 2020-04-10 ENCOUNTER — Other Ambulatory Visit: Payer: Self-pay

## 2020-04-10 VITALS — BP 120/70 | HR 92 | Ht 70.0 in | Wt 192.2 lb

## 2020-04-10 DIAGNOSIS — R202 Paresthesia of skin: Secondary | ICD-10-CM

## 2020-04-10 DIAGNOSIS — M79602 Pain in left arm: Secondary | ICD-10-CM | POA: Diagnosis not present

## 2020-04-10 DIAGNOSIS — M5412 Radiculopathy, cervical region: Secondary | ICD-10-CM | POA: Diagnosis not present

## 2020-04-10 NOTE — Patient Instructions (Addendum)
Thank you for coming in today.  Plan for epidural steroid injection and nerve conduction study.   Please call [facility] at [phone] to schedule your spine injection.   You should hear from neurology.   Use the wrist brace at night to keep the wrist from bending.   Recheck following nerve conduction study.

## 2020-04-11 ENCOUNTER — Encounter: Payer: Self-pay | Admitting: Physical Therapy

## 2020-04-11 ENCOUNTER — Ambulatory Visit (INDEPENDENT_AMBULATORY_CARE_PROVIDER_SITE_OTHER): Payer: 59 | Admitting: Physical Therapy

## 2020-04-11 ENCOUNTER — Telehealth: Payer: Self-pay

## 2020-04-11 DIAGNOSIS — R29898 Other symptoms and signs involving the musculoskeletal system: Secondary | ICD-10-CM

## 2020-04-11 DIAGNOSIS — M25651 Stiffness of right hip, not elsewhere classified: Secondary | ICD-10-CM

## 2020-04-11 DIAGNOSIS — M25652 Stiffness of left hip, not elsewhere classified: Secondary | ICD-10-CM | POA: Diagnosis not present

## 2020-04-11 DIAGNOSIS — R531 Weakness: Secondary | ICD-10-CM | POA: Diagnosis not present

## 2020-04-11 DIAGNOSIS — S76312A Strain of muscle, fascia and tendon of the posterior muscle group at thigh level, left thigh, initial encounter: Secondary | ICD-10-CM

## 2020-04-11 NOTE — Telephone Encounter (Signed)
Yes he can withdraw clopidogrel 7 days prior and typically resume 2 days afterwards but is up to the discretion of the surgeon.

## 2020-04-11 NOTE — Telephone Encounter (Signed)
Hi Dr. Bettina Gavia,  Patient has upcoming spinal injection planned and will need to hold Plavix. He has history of CAD with DES to LCX in 06/2017. Recent Myoview in 07/2019 was low risk. Patient was last seen by you in 01/2020 at which time he was doing well from a cardiac standpoint. Can he hold Plavix for 5 days for spinal injection?  Please route response back to P CV DIV PREOP.  Thank you! Brooklynne Pereida

## 2020-04-11 NOTE — Therapy (Signed)
[place], [place], [address] Phone: [phone]   Fax:  [phone]  Physical Therapy Treatment  Patient Details  Name: Reginald Rios MRN: [id number] Date of Birth: May 28, 1955 Referring Provider (PT): Dr Lynne Leader    Encounter Date: 04/11/2020   PT End of Session - 04/11/20 1404    Visit Number 12    Number of Visits 20    Date for PT Re-Evaluation 05/08/20    PT Start Time 3614    PT Stop Time 1456    PT Time Calculation (min) 52 min    Activity Tolerance Patient tolerated treatment well    Behavior During Therapy [facility] for tasks assessed/performed           Past Medical History:  Diagnosis Date  . ADD (attention deficit disorder)   . Anemia   . Arthritis   . CAD (coronary artery disease)    2/19 PCI/DESx1 to Lcx  . Diabetes mellitus without complication ([place])   . Exercise-induced asthma   . Heart murmur   . Hyperlipidemia    family hx of high cholesterol  . Hypogonadism in male   . Low back pain   . Osteoarthritis     Past Surgical History:  Procedure Laterality Date  . CORONARY STENT INTERVENTION N/A 06/23/2017   Procedure: CORONARY STENT INTERVENTION;  Surgeon: Leonie Man, MD;  Location: [place] [facility];  Service: Cardiovascular;  Laterality: N/A;  . INTRAVASCULAR PRESSURE WIRE/FFR STUDY N/A 06/23/2017   Procedure: INTRAVASCULAR PRESSURE WIRE/FFR STUDY;  Surgeon: Leonie Man, MD;  Location: [place] [facility];  Service: Cardiovascular;  Laterality: N/A;  . KNEE ARTHROSCOPY    . LEFT HEART CATH AND CORONARY ANGIOGRAPHY N/A 06/23/2017   Procedure: LEFT HEART CATH AND CORONARY ANGIOGRAPHY;  Surgeon: Leonie Man, MD;  Location: [place] [facility];  Service: Cardiovascular;  Laterality: N/A;  . SHOULDER ARTHROSCOPY WITH BICEPSTENOTOMY Right 09/06/2019   Procedure: SHOULDER ARTHROSCOPY WITH BICEPSTENOTOMY;  Surgeon: Leandrew Koyanagi, MD;  Location: [place];  Service: Orthopedics;  Laterality: Right;  . SHOULDER ARTHROSCOPY WITH SUBACROMIAL DECOMPRESSION Right 09/06/2019   Procedure: RIGHT SHOULDER ARTHROSCOPY WITH EXTENSIVE DEBRIDEMENT, SUBACROMIAL DECOMPRESSION, BICEPS TENOTOMY;  Surgeon: Leandrew Koyanagi, MD;  Location: [place];  Service: Orthopedics;  Laterality: Right;  . TENOTOMY ACHILLES TENDON      There were no vitals filed for this visit.   Subjective Assessment - 04/11/20 1408    Subjective Patient reports the left proximal HS is still hurting 3/10. He's been carrying things up to the attic for the past 3 days. Driving agonizes    Pertinent History Rt Achilles tendon rupture and repair 2007; Rt RCR 09/06/18; cervical fusion 2015; AODM; chronic LBP; "disc problems"    Patient Stated Goals get leg working again    Currently in Pain? Yes    Pain Score 3     Pain Location Leg    Pain Orientation Left;Upper    Pain Descriptors / Indicators Sore    Pain Type Acute pain                             [facility] Adult PT Treatment/Exercise - 04/11/20 0001      Knee/Hip Exercises: Stretches   Active Hamstring Stretch Left;Right    Active Hamstring Stretch Limitations multiple reps bil; hip hinge x 10      Knee/Hip  Exercises: Aerobic   Recumbent Bike L2: 5.5 min for warm up.      Knee/Hip Exercises: Seated   Knee/Hip Flexion --    Hamstring Curl Left;10 reps    Hamstring Limitations red band      Knee/Hip Exercises: Supine   Bridges Limitations hooklying isometric heel dig 10 sec x 5; straight leg heel dig into 1/30foam roller 10 sec x 5      Manual Therapy   Manual Therapy Soft tissue mobilization    Manual therapy comments Skilled palpation and monitoring of soft tissues during DN    Soft tissue mobilization to left HS            Trigger Point Dry Needling - 04/11/20 0001    Consent Given? Yes    Education Handout Provided Previously provided    Muscles Treated Lower Quadrant Hamstring     Muscles Treated Back/Hip Lumbar multifidi    Other Dry Needling left HS, bil lumbar    Hamstring Response Twitch response elicited;Palpable increased muscle length    Erector spinae Response Palpable increased muscle length                PT Education - 04/11/20 1639    Education Details advised stretching post exercise with dynamic HS/painfree stretches    Person(s) Educated Patient    Methods Explanation;Demonstration    Comprehension Verbalized understanding;Returned demonstration               PT Long Term Goals - 03/20/20 1623      PT LONG TERM GOAL #1   Title Patient to be independent with advanced HEP.    Time 6    Period Weeks    Status On-going    Target Date 05/09/19      PT LONG TERM GOAL #2   Title Improve LE tissue extensibility demonstrated by increased hamstring flexibility to 70 deg with no pain    Time 6    Period Weeks    Status On-going    Target Date 05/08/20      PT LONG TERM GOAL #3   Title LE hip extension strength 4+/5 to 5/5    Time 6    Period Weeks    Status Achieved    Target Date 05/08/20      PT LONG TERM GOAL #4   Title Patient reports return to pickleball at level prior to injury 01/25/20    Time 6    Period Weeks    Status On-going    Target Date 05/08/20      PT LONG TERM GOAL #5   Title Improve FOTO to </= 31% limitation    Time 6    Period Weeks    Status Achieved                 Plan - 04/11/20 1640    Clinical Impression Statement Patient presenting with continued c/o left proximal HS pain. We worked on dynamic and painfree HS stretches to avoid irritating the injured HS. PT advised decreasing pickleball activity and focus on painfree strenghening. He responded very well to DN today in the left HS and lumbar. Making gradual progress toward LTGs.    PT Frequency 2x / week    PT Duration 6 weeks    PT Treatment/Interventions ADLs/Self Care Home Management;Aquatic Therapy;Cryotherapy;Electrical  Stimulation;Iontophoresis 4mg /ml Dexamethasone;Moist Heat;Ultrasound;Gait training;Stair training;Functional mobility training;Therapeutic activities;Therapeutic exercise;Balance training;Neuromuscular re-education;Patient/family education;Manual techniques;Passive range of motion;Dry needling;Taping;Vasopneumatic Device    PT Next Visit Plan Continue  with hip hinge, HS strength in extended knee position, Assess DN; progress functional strengthening and sports specific as tolerated.    PT Home Exercise Plan [id number]           Patient will benefit from skilled therapeutic intervention in order to improve the following deficits and impairments:  Abnormal gait,Decreased range of motion,Increased fascial restricitons,Increased muscle spasms,Pain,Impaired flexibility,Improper body mechanics,Decreased mobility,Decreased strength,Postural dysfunction,Increased edema  Visit Diagnosis: Hamstring strain, left, initial encounter  Other symptoms and signs involving the musculoskeletal system  Weakness generalized  Stiffness of both hip joints     Problem List Patient Active Problem List   Diagnosis Date Noted  . Myofascial pain syndrome, cervical 07/28/2019  . Lateral epicondylitis of right elbow 07/28/2019  . Nontraumatic incomplete tear of right rotator cuff 07/25/2019  . Tendinopathy of right biceps tendon 07/25/2019  . Erectile dysfunction 07/11/2019  . Mild persistent asthma/cough variant asthma 04/13/2019  . Allergic rhinitis with a possible nonallergic component 04/13/2019  . Acid reflux 04/13/2019  . Cough 04/13/2019  . Hoarseness 04/13/2019  . Cervical radicular pain 01/10/2019  . Ulnar neuropathy at elbow of right upper extremity 01/03/2019  . Acute bilateral low back pain without sciatica 01/03/2019  . Onychomycosis 05/25/2018  . Palpitations 07/06/2017  . CAD (coronary artery disease) 07/05/2017  . Family history of early CAD 07/05/2017  . Abnormal findings on diagnostic  imaging of cardiovascular system 06/23/2017  . Low vitamin D level 04/22/2017  . Pain in right ankle and joints of right foot 10/05/2016  . Hemarthrosis of right elbow 02/03/2016  . Small thenar eminence 02/03/2016  . Cervical disc disorder with radiculopathy of cervical region 10/07/2015  . Incomplete rotator cuff tear 09/10/2015  . Nonischemic cardiomyopathy ([place]) 05/15/2015  . Fatigue 05/15/2015  . Dyspnea on exertion 05/15/2015  . Decreased cardiac ejection fraction 04/02/2015  . Strain of latissimus dorsi muscle 01/21/2015  . Shoulder bursitis 10/12/2014  . Stenosis of lateral recess of lumbar spine 10/12/2014  . BPPV (benign paroxysmal positional vertigo) 08/14/2014  . Hyperlipidemia 01/19/2014  . Anemia, unspecified 01/19/2014  . ADD (attention deficit disorder) 01/19/2014  . Type 2 diabetes mellitus with hyperglycemia, without long-term current use of insulin ([place]) 12/22/2013  . Hypogonadism in male 12/22/2013  . Arthritis of hand, left 12/22/2013    Madelyn Flavors PT 04/11/2020, 4:48 PM  [facility] [place], [place], [address] Phone: [phone]   Fax:  [phone]  Name: DAMEN WINDSOR MRN: [id number] Date of Birth: 03/29/1956

## 2020-04-11 NOTE — Telephone Encounter (Signed)
   [facility] Pre-operative Risk Assessment    HEARTCARE STAFF: - Please ensure there is not already an duplicate clearance open for this procedure. - Under Visit Info/Reason for Call, type in Other and utilize the format Clearance MM/DD/YY or Clearance TBD. Do not use dashes or single digits. - If request is for dental extraction, please clarify the # of teeth to be extracted.  Request for surgical clearance:  1. What type of surgery is being performed? Cervical Epidural   2. When is this surgery scheduled? TBD   3. What type of clearance is required (medical clearance vs. Pharmacy clearance to hold med vs. Both)? Both  4. Are there any medications that need to be held prior to surgery and how long? Plavix for 5 days   5. Practice name and name of physician performing surgery? [facility]   6. What is the office phone number? [phone]   7.   What is the office fax number? [phone]  8.   Anesthesia type (None, local, MAC, general) ? Local   Gita Kudo 04/11/2020, 9:01 AM  _________________________________________________________________   (provider comments below)

## 2020-04-12 ENCOUNTER — Telehealth: Payer: Self-pay | Admitting: *Deleted

## 2020-04-12 NOTE — Telephone Encounter (Signed)
Caller Name [facility] Phone Number [phone] Call Type Message Only Information Provided Reason for Call Returning a Call from the Office Initial Comment Caller missed a call from the office. Additional Comment Caller states please leave a detailed message if he misses the call from the office again. If it was about his appt on 04-16-2020 he says he will be at the appt at 1245am

## 2020-04-12 NOTE — Telephone Encounter (Signed)
   Primary Cardiologist: Shirlee More, MD  Chart reviewed as part of pre-operative protocol coverage. Patient was last seen by Dr. Bettina Gavia in 01/2020 at which time he was doing well from a cardiac standpoint. Patient was contacted today for further pre-op evaluation and reported doing well since last visit. He denied any chest pain, shortness of breath, orthopnea, PND, palpitations, dizziness, syncope. He is able to complete >4.0 METS without any problems.  Given past medical history and time since last visit, based on ACC/AHA guidelines, [place] would be at acceptable risk for the planned procedure without further cardiovascular testing.   Per Dr. Bettina Gavia, [place] to hold Plavix 7 days prior to spinal injection. This should be restarted as soon as possible following procedure.  I will route this recommendation to the requesting party via Epic fax function and remove from pre-op pool.  Please call with questions.  Darreld Mclean, PA-C 04/12/2020, 10:21 AM

## 2020-04-12 NOTE — Telephone Encounter (Signed)
Patient was scheduled for 2 slots on same day.  I cancelled other appt and kept the one for 1245 on 12/14.

## 2020-04-16 ENCOUNTER — Other Ambulatory Visit: Payer: Self-pay | Admitting: Family Medicine

## 2020-04-16 ENCOUNTER — Encounter: Payer: Self-pay | Admitting: Family Medicine

## 2020-04-16 ENCOUNTER — Other Ambulatory Visit: Payer: Self-pay

## 2020-04-16 ENCOUNTER — Ambulatory Visit: Payer: 59 | Admitting: Family Medicine

## 2020-04-16 VITALS — BP 110/68 | HR 81 | Temp 99.0°F | Ht 70.0 in | Wt 196.0 lb

## 2020-04-16 DIAGNOSIS — E1165 Type 2 diabetes mellitus with hyperglycemia: Secondary | ICD-10-CM

## 2020-04-16 DIAGNOSIS — E785 Hyperlipidemia, unspecified: Secondary | ICD-10-CM

## 2020-04-16 DIAGNOSIS — Z794 Long term (current) use of insulin: Secondary | ICD-10-CM

## 2020-04-16 DIAGNOSIS — F988 Other specified behavioral and emotional disorders with onset usually occurring in childhood and adolescence: Secondary | ICD-10-CM | POA: Diagnosis not present

## 2020-04-16 LAB — COMPREHENSIVE METABOLIC PANEL WITH GFR
ALT: 19 U/L (ref 0–53)
AST: 15 U/L (ref 0–37)
Albumin: 4.3 g/dL (ref 3.5–5.2)
Alkaline Phosphatase: 71 U/L (ref 39–117)
BUN: 15 mg/dL (ref 6–23)
CO2: 26 meq/L (ref 19–32)
Calcium: 9.3 mg/dL (ref 8.4–10.5)
Chloride: 105 meq/L (ref 96–112)
Creatinine, Ser: 0.77 mg/dL (ref 0.40–1.50)
GFR: 93.03 mL/min
Glucose, Bld: 115 mg/dL — ABNORMAL HIGH (ref 70–99)
Potassium: 4.2 meq/L (ref 3.5–5.1)
Sodium: 140 meq/L (ref 135–145)
Total Bilirubin: 0.4 mg/dL (ref 0.2–1.2)
Total Protein: 7 g/dL (ref 6.0–8.3)

## 2020-04-16 LAB — LIPID PANEL
Cholesterol: 131 mg/dL (ref 0–200)
HDL: 53.6 mg/dL
LDL Cholesterol: 55 mg/dL (ref 0–99)
NonHDL: 77.2
Total CHOL/HDL Ratio: 2
Triglycerides: 110 mg/dL (ref 0.0–149.0)
VLDL: 22 mg/dL (ref 0.0–40.0)

## 2020-04-16 LAB — HEMOGLOBIN A1C: Hgb A1c MFr Bld: 6.6 % — ABNORMAL HIGH (ref 4.6–6.5)

## 2020-04-16 MED ORDER — METHYLPHENIDATE HCL 20 MG PO TABS: 20.0000 mg | ORAL_TABLET | Freq: Two times a day (BID) | ORAL | 0 refills | Status: DC

## 2020-04-16 MED FILL — PREGABALIN 75 MG CAPS: 75 | 30 days supply | Qty: 60 | Fill #1

## 2020-04-16 MED FILL — METHYLPHENIDATE HCL 20 MG T: 20 | 30 days supply | Qty: 60 | Fill #0

## 2020-04-16 MED FILL — JANUVIA 100 MG TABLET: 100 | 30 days supply | Qty: 30 | Fill #2

## 2020-04-16 MED FILL — FARXIGA 10 MG TABLET: 10 | 30 days supply | Qty: 30 | Fill #0

## 2020-04-16 MED FILL — METFORMIN HCL 1000 MG TABS: 1000 | 90 days supply | Qty: 180 | Fill #0

## 2020-04-16 NOTE — Progress Notes (Signed)
Subjective:   Chief Complaint  Patient presents with   Follow-up    Reginald Rios is a 64 y.o. male here for follow-up of diabetes.   Reginald Rios's self monitored glucose range is low 100's.  Patient denies hypoglycemic reactions. He checks his glucose levels 1 time(s) per week. Patient does require insulin.   Medications include: Januvia 100 mg/d, metformin 1000 mg bid, Farxiga 10 mg/d, Actos 30 mg/d Diet is healthy.  Exercise: Pickleball  Hyperlipidemia Patient presents for dyslipidemia follow up. Currently being treated with Pravastatin and compliance with treatment thus far has been good. He denies myalgias. Diet/exercise as above. The patient is not known to have coexisting coronary artery disease.  ADD Hx of, on Ritalin 20 mg bid. Takes on days he needs to get things done. No AE's.   Past Medical History:  Diagnosis Date   ADD (attention deficit disorder)    Anemia    Arthritis    CAD (coronary artery disease)    2/19 PCI/DESx1 to Lcx   Diabetes mellitus without complication (HCC)    Exercise-induced asthma    Heart murmur    Hyperlipidemia    family hx of high cholesterol   Hypogonadism in male    Low back pain    Osteoarthritis      Related testing: Retinal exam: Done Pneumovax: done  Objective:  BP 110/68 (BP Location: Left Arm, Patient Position: Sitting, Cuff Size: Normal)    Pulse 81    Temp 99 F (37.2 C) (Oral)    Ht 5\' 10"  (1.778 m)    Wt 196 lb (88.9 kg)    SpO2 98%    BMI 28.12 kg/m  General:  Well developed, well nourished, in no apparent distress Skin:  Warm, no pallor or diaphoresis Lungs:  CTAB, no access msc use Cardio:  RRR, no bruits, no LE edema Psych: Age appropriate judgment and insight  Assessment:   Type 2 diabetes mellitus with hyperglycemia, without long-term current use of insulin (HCC) - Plan: Lipid panel, Hemoglobin A1c, Comprehensive metabolic panel, Microalbumin / creatinine urine ratio  Hyperlipidemia,  unspecified hyperlipidemia type  Attention deficit disorder (ADD) without hyperactivity - Plan: POCT Urine Drug Screen   Plan:   1. Ck a1c. Counseled on diet and exercise. 2. Cont statin. Ck lipids. 3. Ck UDS. Re-sign contract. F/u in 6 mo. The patient voiced understanding and agreement to the plan.  [place], DO 04/16/20 1:19 PM

## 2020-04-16 NOTE — Patient Instructions (Signed)
Give us 2-3 business days to get the results of your labs back.   Keep the diet clean and stay active.  Let us know if you need anything. 

## 2020-04-17 DIAGNOSIS — N528 Other male erectile dysfunction: Secondary | ICD-10-CM | POA: Diagnosis not present

## 2020-04-17 LAB — MICROALBUMIN / CREATININE URINE RATIO
Creatinine,U: 61 mg/dL
Microalb Creat Ratio: 1.1 mg/g (ref 0.0–30.0)
Microalb, Ur: <0.7 mg/dL (ref 0.0–1.9)

## 2020-04-18 ENCOUNTER — Ambulatory Visit (INDEPENDENT_AMBULATORY_CARE_PROVIDER_SITE_OTHER): Payer: 59 | Admitting: Physical Therapy

## 2020-04-18 ENCOUNTER — Other Ambulatory Visit: Payer: Self-pay

## 2020-04-18 DIAGNOSIS — R29898 Other symptoms and signs involving the musculoskeletal system: Secondary | ICD-10-CM | POA: Diagnosis not present

## 2020-04-18 DIAGNOSIS — R531 Weakness: Secondary | ICD-10-CM | POA: Diagnosis not present

## 2020-04-18 DIAGNOSIS — S76312A Strain of muscle, fascia and tendon of the posterior muscle group at thigh level, left thigh, initial encounter: Secondary | ICD-10-CM | POA: Diagnosis not present

## 2020-04-18 NOTE — Therapy (Signed)
[place], [place], [address] Phone: [phone]   Fax:  [phone]  Physical Therapy Treatment  Patient Details  Name: Reginald Rios MRN: [id number] Date of Birth: 1955-12-14 Referring Provider (PT): Dr Lynne Leader    Encounter Date: 04/18/2020   PT End of Session - 04/18/20 1156    Visit Number 13    Number of Visits 20    Date for PT Re-Evaluation 05/08/20    PT Start Time 1149    PT Stop Time 1230    PT Time Calculation (min) 41 min    Activity Tolerance Patient tolerated treatment well    Behavior During Therapy [facility] for tasks assessed/performed           Past Medical History:  Diagnosis Date  . ADD (attention deficit disorder)   . Anemia   . Arthritis   . CAD (coronary artery disease)    2/19 PCI/DESx1 to Lcx  . Diabetes mellitus without complication ([place])   . Exercise-induced asthma   . Heart murmur   . Hyperlipidemia    family hx of high cholesterol  . Hypogonadism in male   . Low back pain   . Osteoarthritis     Past Surgical History:  Procedure Laterality Date  . CORONARY STENT INTERVENTION N/A 06/23/2017   Procedure: CORONARY STENT INTERVENTION;  Surgeon: Leonie Man, MD;  Location: [place] [facility];  Service: Cardiovascular;  Laterality: N/A;  . INTRAVASCULAR PRESSURE WIRE/FFR STUDY N/A 06/23/2017   Procedure: INTRAVASCULAR PRESSURE WIRE/FFR STUDY;  Surgeon: Leonie Man, MD;  Location: [place] [facility];  Service: Cardiovascular;  Laterality: N/A;  . KNEE ARTHROSCOPY    . LEFT HEART CATH AND CORONARY ANGIOGRAPHY N/A 06/23/2017   Procedure: LEFT HEART CATH AND CORONARY ANGIOGRAPHY;  Surgeon: Leonie Man, MD;  Location: [place] [facility];  Service: Cardiovascular;  Laterality: N/A;  . SHOULDER ARTHROSCOPY WITH BICEPSTENOTOMY Right 09/06/2019   Procedure: SHOULDER ARTHROSCOPY WITH BICEPSTENOTOMY;  Surgeon: Leandrew Koyanagi, MD;  Location: [place];  Service: Orthopedics;  Laterality: Right;  . SHOULDER ARTHROSCOPY WITH SUBACROMIAL DECOMPRESSION Right 09/06/2019   Procedure: RIGHT SHOULDER ARTHROSCOPY WITH EXTENSIVE DEBRIDEMENT, SUBACROMIAL DECOMPRESSION, BICEPS TENOTOMY;  Surgeon: Leandrew Koyanagi, MD;  Location: [place];  Service: Orthopedics;  Laterality: Right;  . TENOTOMY ACHILLES TENDON      There were no vitals filed for this visit.   Subjective Assessment - 04/18/20 1159    Subjective "It popped again".  Pt reports he was playing Pickleball for 30 min prior to experiencing a popping sensation in Lt high hamstring area.  He didn't do any exercises for 2 days afterwards due to fear "of pulling it out again".    Pertinent History Rt Achilles tendon rupture and repair 2007; Rt RCR 09/06/18; cervical fusion 2015; AODM; chronic LBP; "disc problems"    Patient Stated Goals get leg working again    Currently in Pain? Yes    Pain Score 2     Pain Location Leg    Pain Orientation Left;Upper    Pain Descriptors / Indicators Sore;Tightness    Aggravating Factors  forward bending and Rt twisting for pickle ball    Pain Relieving Factors stretching, heat, DN.              [facility] PT Assessment - 04/18/20 0001      Assessment   Medical Diagnosis Rt hamstring strain  Referring Provider (PT) Dr Lynne Leader     Onset Date/Surgical Date 01/25/20    Hand Dominance Right    Prior Therapy yes for neck; shoulder; LB; anle       Flexibility   Hamstrings L 55 deg in hooklying hamstring stretch            [facility] Adult PT Treatment/Exercise - 04/18/20 0001      Knee/Hip Exercises: Stretches   Active Hamstring Stretch Left;3 reps;30 seconds   hooklying with strap   ITB Stretch Left;2 reps;20 seconds    Other Knee/Hip Stretches supine adductor stretch x 30 sec x 2      Knee/Hip Exercises: Aerobic   Recumbent Bike L1: 4.5 min    Other Aerobic single laps around gym to assess response to exercises.       Knee/Hip  Exercises: Standing   Forward Step Up Left;1 set;10 reps;Hand Hold: 1;Step Height: 8"    Other Standing Knee Exercises Lt SLS forward leans to touch chair (too difficult) - switched to leans to touch sink; continued difficulty. 6 reps in total with tactile/ VC.    Other Standing Knee Exercises Hip abdct isometric pushing LE into door frame x 3 sec x 2 reps each leg (very challenging).      Knee/Hip Exercises: Seated   Hamstring Curl Left;10 reps    Hamstring Limitations red band      Manual Therapy   Manual therapy comments Skilled palpation and monitoring of soft tissues during DN            Trigger Point Dry Needling - 04/18/20 0001    Consent Given? Yes    Education Handout Provided Previously provided    Muscles Treated Lower Quadrant Adductor longus/brevis/magnus;Hamstring    Dry Needling Comments left    Adductor Response Palpable increased muscle length    Hamstring Response Twitch response elicited;Palpable increased muscle length                     PT Long Term Goals - 03/20/20 1623      PT LONG TERM GOAL #1   Title Patient to be independent with advanced HEP.    Time 6    Period Weeks    Status On-going    Target Date 05/09/19      PT LONG TERM GOAL #2   Title Improve LE tissue extensibility demonstrated by increased hamstring flexibility to 70 deg with no pain    Time 6    Period Weeks    Status On-going    Target Date 05/08/20      PT LONG TERM GOAL #3   Title LE hip extension strength 4+/5 to 5/5    Time 6    Period Weeks    Status Achieved    Target Date 05/08/20      PT LONG TERM GOAL #4   Title Patient reports return to pickleball at level prior to injury 01/25/20    Time 6    Period Weeks    Status On-going    Target Date 05/08/20      PT LONG TERM GOAL #5   Title Improve FOTO to </= 31% limitation    Time 6    Period Weeks    Status Achieved                 Plan - 04/18/20 1229    Clinical Impression Statement Pt  tolerated Lt hamstring loading better than previous sessions; decreased  cramping and tightness with each exercise.  Pt demonstrates decreased balance with Lt SLS and was very challenged with these exercises.  Pt may benefit from additional hip strengthening exercises and use of recumbent bike at gym.  He responded well to DN of left proximal/mid HS and hip ADDuctors today. Progressing gradually towards remaining goals.    PT Frequency 2x / week    PT Duration 6 weeks    PT Treatment/Interventions ADLs/Self Care Home Management;Aquatic Therapy;Cryotherapy;Electrical Stimulation;Iontophoresis 4mg /ml Dexamethasone;Moist Heat;Ultrasound;Gait training;Stair training;Functional mobility training;Therapeutic activities;Therapeutic exercise;Balance training;Neuromuscular re-education;Patient/family education;Manual techniques;Passive range of motion;Dry needling;Taping;Vasopneumatic Device    PT Next Visit Plan Continue with hip hinge, HS strength in extended knee position, Assess DN; progress functional strengthening and sports specific as tolerated.    PT Home Exercise Plan [id number]           Patient will benefit from skilled therapeutic intervention in order to improve the following deficits and impairments:  Abnormal gait,Decreased range of motion,Increased fascial restricitons,Increased muscle spasms,Pain,Impaired flexibility,Improper body mechanics,Decreased mobility,Decreased strength,Postural dysfunction,Increased edema  Visit Diagnosis: Hamstring strain, left, initial encounter  Other symptoms and signs involving the musculoskeletal system  Weakness generalized     Problem List Patient Active Problem List   Diagnosis Date Noted  . Myofascial pain syndrome, cervical 07/28/2019  . Lateral epicondylitis of right elbow 07/28/2019  . Nontraumatic incomplete tear of right rotator cuff 07/25/2019  . Tendinopathy of right biceps tendon 07/25/2019  . Erectile dysfunction 07/11/2019  . Mild  persistent asthma/cough variant asthma 04/13/2019  . Allergic rhinitis with a possible nonallergic component 04/13/2019  . Acid reflux 04/13/2019  . Cough 04/13/2019  . Hoarseness 04/13/2019  . Cervical radicular pain 01/10/2019  . Ulnar neuropathy at elbow of right upper extremity 01/03/2019  . Acute bilateral low back pain without sciatica 01/03/2019  . Onychomycosis 05/25/2018  . Palpitations 07/06/2017  . CAD (coronary artery disease) 07/05/2017  . Family history of early CAD 07/05/2017  . Abnormal findings on diagnostic imaging of cardiovascular system 06/23/2017  . Low vitamin D level 04/22/2017  . Pain in right ankle and joints of right foot 10/05/2016  . Hemarthrosis of right elbow 02/03/2016  . Small thenar eminence 02/03/2016  . Cervical disc disorder with radiculopathy of cervical region 10/07/2015  . Incomplete rotator cuff tear 09/10/2015  . Nonischemic cardiomyopathy ([place]) 05/15/2015  . Fatigue 05/15/2015  . Dyspnea on exertion 05/15/2015  . Decreased cardiac ejection fraction 04/02/2015  . Strain of latissimus dorsi muscle 01/21/2015  . Shoulder bursitis 10/12/2014  . Stenosis of lateral recess of lumbar spine 10/12/2014  . BPPV (benign paroxysmal positional vertigo) 08/14/2014  . Hyperlipidemia 01/19/2014  . Anemia, unspecified 01/19/2014  . ADD (attention deficit disorder) 01/19/2014  . Type 2 diabetes mellitus with hyperglycemia, without long-term current use of insulin ([place]) 12/22/2013  . Hypogonadism in male 12/22/2013  . Arthritis of hand, left 12/22/2013   Kerin Perna, PTA 04/18/20 3:12 PM  [place], [place], [address] Phone: [phone]   Fax:  [phone]  Name: Reginald Rios MRN: [id number] Date of Birth: Oct 07, 1955  Madelyn Flavors, PT 04/18/20 3:12 PM  [facility] [place],   [address]  [phone] (office) [phone] (fax)

## 2020-04-22 ENCOUNTER — Other Ambulatory Visit: Payer: 59

## 2020-04-30 DIAGNOSIS — M5432 Sciatica, left side: Secondary | ICD-10-CM | POA: Diagnosis not present

## 2020-04-30 DIAGNOSIS — M9903 Segmental and somatic dysfunction of lumbar region: Secondary | ICD-10-CM | POA: Diagnosis not present

## 2020-04-30 DIAGNOSIS — M5126 Other intervertebral disc displacement, lumbar region: Secondary | ICD-10-CM | POA: Diagnosis not present

## 2020-05-01 ENCOUNTER — Other Ambulatory Visit: Payer: Self-pay

## 2020-05-01 ENCOUNTER — Encounter: Payer: Self-pay | Admitting: Physical Therapy

## 2020-05-01 ENCOUNTER — Ambulatory Visit (INDEPENDENT_AMBULATORY_CARE_PROVIDER_SITE_OTHER): Payer: 59 | Admitting: Physical Therapy

## 2020-05-01 DIAGNOSIS — M25651 Stiffness of right hip, not elsewhere classified: Secondary | ICD-10-CM

## 2020-05-01 DIAGNOSIS — R531 Weakness: Secondary | ICD-10-CM | POA: Diagnosis not present

## 2020-05-01 DIAGNOSIS — M25652 Stiffness of left hip, not elsewhere classified: Secondary | ICD-10-CM

## 2020-05-01 DIAGNOSIS — S76312A Strain of muscle, fascia and tendon of the posterior muscle group at thigh level, left thigh, initial encounter: Secondary | ICD-10-CM

## 2020-05-01 DIAGNOSIS — R29898 Other symptoms and signs involving the musculoskeletal system: Secondary | ICD-10-CM | POA: Diagnosis not present

## 2020-05-01 NOTE — Patient Instructions (Signed)
Access Code: [email or web address] URL: [email or web address] Date: 05/01/2020 Prepared by: Roderic Scarce  Exercises Supine Bridge with Resistance Band - 3-4 x weekly - 3 sets - 10 reps Marching Bridge - 3 sets - 10 reps Dynamic Straight Leg Kicks - 10 reps Walking Hamstring Stretch - 10 reps Side Lunge Adductor Stretch - 10 reps Kneeling Adductor Stretch with Hip External Rotation - 10 reps Forward T - 3-4 x weekly - 2-3 sets - 10 reps

## 2020-05-01 NOTE — Therapy (Addendum)
[place], [place], [address] Phone: [phone]   Fax:  [phone]  Physical Therapy Treatment/Discharge  Patient Details  Name: Reginald Rios MRN: [id number] Date of Birth: 1955/10/21 Referring Provider (PT): Dr Lynne Leader    Encounter Date: 05/01/2020   PT End of Session - 05/01/20 0927    Visit Number 14    Number of Visits 20    Date for PT Re-Evaluation 05/08/20    PT Start Time 0927    PT Stop Time 1019    PT Time Calculation (min) 52 min    Activity Tolerance Patient tolerated treatment well    Behavior During Therapy [facility] for tasks assessed/performed           Past Medical History:  Diagnosis Date  . ADD (attention deficit disorder)   . Anemia   . Arthritis   . CAD (coronary artery disease)    2/19 PCI/DESx1 to Lcx  . Diabetes mellitus without complication ([place])   . Exercise-induced asthma   . Heart murmur   . Hyperlipidemia    family hx of high cholesterol  . Hypogonadism in male   . Low back pain   . Osteoarthritis     Past Surgical History:  Procedure Laterality Date  . CORONARY STENT INTERVENTION N/A 06/23/2017   Procedure: CORONARY STENT INTERVENTION;  Surgeon: Leonie Man, MD;  Location: [place] [facility];  Service: Cardiovascular;  Laterality: N/A;  . INTRAVASCULAR PRESSURE WIRE/FFR STUDY N/A 06/23/2017   Procedure: INTRAVASCULAR PRESSURE WIRE/FFR STUDY;  Surgeon: Leonie Man, MD;  Location: [place] [facility];  Service: Cardiovascular;  Laterality: N/A;  . KNEE ARTHROSCOPY    . LEFT HEART CATH AND CORONARY ANGIOGRAPHY N/A 06/23/2017   Procedure: LEFT HEART CATH AND CORONARY ANGIOGRAPHY;  Surgeon: Leonie Man, MD;  Location: [place] [facility];  Service: Cardiovascular;  Laterality: N/A;  . SHOULDER ARTHROSCOPY WITH BICEPSTENOTOMY Right 09/06/2019   Procedure: SHOULDER ARTHROSCOPY WITH BICEPSTENOTOMY;  Surgeon: Leandrew Koyanagi, MD;  Location: [place];  Service: Orthopedics;  Laterality: Right;  . SHOULDER ARTHROSCOPY WITH SUBACROMIAL DECOMPRESSION Right 09/06/2019   Procedure: RIGHT SHOULDER ARTHROSCOPY WITH EXTENSIVE DEBRIDEMENT, SUBACROMIAL DECOMPRESSION, BICEPS TENOTOMY;  Surgeon: Leandrew Koyanagi, MD;  Location: [place];  Service: Orthopedics;  Laterality: Right;  . TENOTOMY ACHILLES TENDON      There were no vitals filed for this visit.   Subjective Assessment - 05/01/20 0927    Subjective Pt reports he feels like he is getting better in the hamstrings, today the low back is sore right at the belt line.  He saw the chiropractor yesterday and he was told to eat anti-inflammatory food.  He is also going to have an injection in his neck.    Patient Stated Goals get leg working again    Currently in Pain? Yes    Pain Score 7     Pain Location Back    Pain Orientation Lower    Pain Descriptors / Indicators Dull;Sore              [facility] PT Assessment - 05/01/20 0001      Assessment   Medical Diagnosis Rt hamstring strain                          [facility] Adult PT Treatment/Exercise - 05/01/20 0001      Knee/Hip Exercises: Stretches   Active  Hamstring Stretch Both   2x10   Other Knee/Hip Stretches dynamic stretching hamstrings and hip adductors    Other Knee/Hip Stretches knee to chest      Knee/Hip Exercises: Aerobic   Recumbent Bike L3x5'      Knee/Hip Exercises: Supine   Bridges Strengthening;Both;3 sets;10 reps   with green band around knees, VC to press through heels   Bridges Limitations one set with marching      Modalities   Modalities Cryotherapy      Cryotherapy   Number Minutes Cryotherapy 10 Minutes    Cryotherapy Location --   Lt hamstring origin   Type of Cryotherapy Ice pack                  PT Education - 05/01/20 1002    Education Details advacnced HEP    Person(s) Educated Patient    Methods Explanation;Demonstration;Handout    Comprehension  Returned demonstration;Verbalized understanding               PT Long Term Goals - 03/20/20 1623      PT LONG TERM GOAL #1   Title Patient to be independent with advanced HEP.    Time 6    Period Weeks    Status On-going    Target Date 05/09/19      PT LONG TERM GOAL #2   Title Improve LE tissue extensibility demonstrated by increased hamstring flexibility to 70 deg with no pain    Time 6    Period Weeks    Status On-going    Target Date 05/08/20      PT LONG TERM GOAL #3   Title LE hip extension strength 4+/5 to 5/5    Time 6    Period Weeks    Status Achieved    Target Date 05/08/20      PT LONG TERM GOAL #4   Title Patient reports return to pickleball at level prior to injury 01/25/20    Time 6    Period Weeks    Status On-going    Target Date 05/08/20      PT LONG TERM GOAL #5   Title Improve FOTO to </= 31% limitation    Time 6    Period Weeks    Status Achieved                 Plan - 05/01/20 1003    Clinical Impression Statement Pt reports he was able to play pickleball for 3 hrs the last couple of days and only had a little bit of HS discomfort on the Lt side.  His low back is more sore today than the hip.  He responded really well to dynamic stretching to loosen his hips and low back.  Left with 50% reduction in pain . Making progress to goals    Rehab Potential Good    PT Frequency 2x / week    PT Duration 6 weeks    PT Treatment/Interventions ADLs/Self Care Home Management;Aquatic Therapy;Cryotherapy;Electrical Stimulation;Iontophoresis 78m/ml Dexamethasone;Moist Heat;Ultrasound;Gait training;Stair training;Functional mobility training;Therapeutic activities;Therapeutic exercise;Balance training;Neuromuscular re-education;Patient/family education;Manual techniques;Passive range of motion;Dry needling;Taping;Vasopneumatic Device    PT Next Visit Plan Continue with hip hinge, HS strength in extended knee position, Assess DN; progress functional  strengthening and sports specific as tolerated.    PT Home Exercise Plan [id number] [email or web address]    Consulted and Agree with Plan of Care Patient           Patient will benefit from skilled  therapeutic intervention in order to improve the following deficits and impairments:  Abnormal gait,Decreased range of motion,Increased fascial restricitons,Increased muscle spasms,Pain,Impaired flexibility,Improper body mechanics,Decreased mobility,Decreased strength,Postural dysfunction,Increased edema  Visit Diagnosis: Hamstring strain, left, initial encounter  Other symptoms and signs involving the musculoskeletal system  Weakness generalized  Stiffness of both hip joints     Problem List Patient Active Problem List   Diagnosis Date Noted  . Myofascial pain syndrome, cervical 07/28/2019  . Lateral epicondylitis of right elbow 07/28/2019  . Nontraumatic incomplete tear of right rotator cuff 07/25/2019  . Tendinopathy of right biceps tendon 07/25/2019  . Erectile dysfunction 07/11/2019  . Mild persistent asthma/cough variant asthma 04/13/2019  . Allergic rhinitis with a possible nonallergic component 04/13/2019  . Acid reflux 04/13/2019  . Cough 04/13/2019  . Hoarseness 04/13/2019  . Cervical radicular pain 01/10/2019  . Ulnar neuropathy at elbow of right upper extremity 01/03/2019  . Acute bilateral low back pain without sciatica 01/03/2019  . Onychomycosis 05/25/2018  . Palpitations 07/06/2017  . CAD (coronary artery disease) 07/05/2017  . Family history of early CAD 07/05/2017  . Abnormal findings on diagnostic imaging of cardiovascular system 06/23/2017  . Low vitamin D level 04/22/2017  . Pain in right ankle and joints of right foot 10/05/2016  . Hemarthrosis of right elbow 02/03/2016  . Small thenar eminence 02/03/2016  . Cervical disc disorder with radiculopathy of cervical region 10/07/2015  . Incomplete rotator cuff tear 09/10/2015  . Nonischemic cardiomyopathy ([place])  05/15/2015  . Fatigue 05/15/2015  . Dyspnea on exertion 05/15/2015  . Decreased cardiac ejection fraction 04/02/2015  . Strain of latissimus dorsi muscle 01/21/2015  . Shoulder bursitis 10/12/2014  . Stenosis of lateral recess of lumbar spine 10/12/2014  . BPPV (benign paroxysmal positional vertigo) 08/14/2014  . Hyperlipidemia 01/19/2014  . Anemia, unspecified 01/19/2014  . ADD (attention deficit disorder) 01/19/2014  . Type 2 diabetes mellitus with hyperglycemia, without long-term current use of insulin ([place]) 12/22/2013  . Hypogonadism in male 12/22/2013  . Arthritis of hand, left 12/22/2013    Jeral Pinch PT  05/01/2020, 10:10 AM  [facility] [place], [place], [address] Phone: [phone]   Fax:  [phone]  Name: Reginald Rios MRN: [id number] Date of Birth: 07/25/1955   PHYSICAL THERAPY DISCHARGE SUMMARY  Visits from Start of Care: 14  Current functional level related to goals / functional outcomes: See above for function at last visit, he has been able to return to some pickle ball   Remaining deficits: unknown  Education / Equipment: HEP and DN Plan:                                                    Patient goals were partially met. Patient is being discharged due to not returning since the last visit.  ?????    Jeral Pinch, PT 05/30/20 12:54 PM

## 2020-05-06 MED FILL — LOSARTAN POTASSIUM 25 MG TA: 25 | 30 days supply | Qty: 30 | Fill #3

## 2020-05-09 ENCOUNTER — Encounter: Payer: 59 | Admitting: Physical Therapy

## 2020-05-13 ENCOUNTER — Ambulatory Visit
Admission: RE | Admit: 2020-05-13 | Discharge: 2020-05-13 | Disposition: A | Discharge status: Home / Self Care | Payer: 59 | Source: Ambulatory Visit | Attending: Family Medicine | Admitting: Family Medicine

## 2020-05-13 DIAGNOSIS — M4322 Fusion of spine, cervical region: Secondary | ICD-10-CM | POA: Diagnosis not present

## 2020-05-13 DIAGNOSIS — M79602 Pain in left arm: Secondary | ICD-10-CM

## 2020-05-13 DIAGNOSIS — M5412 Radiculopathy, cervical region: Secondary | ICD-10-CM

## 2020-05-13 MED ORDER — TRIAMCINOLONE ACETONIDE 40 MG/ML IJ SUSP (RADIOLOGY)
60.0000 mg | Freq: Once | INTRAMUSCULAR | Status: AC
  Administered 2020-05-13: 60 mg via EPIDURAL

## 2020-05-13 MED ORDER — IOPAMIDOL (ISOVUE-M 300) INJECTION 61%
1.0000 mL | Freq: Once | INTRAMUSCULAR | Status: AC | PRN
  Administered 2020-05-13: 1 mL via EPIDURAL

## 2020-05-13 NOTE — Discharge Instructions (Signed)

## 2020-05-14 ENCOUNTER — Other Ambulatory Visit: Payer: Self-pay | Admitting: Family Medicine

## 2020-05-14 MED FILL — PREGABALIN 75 MG CAPS: 75 | 30 days supply | Qty: 60 | Fill #2

## 2020-05-14 MED FILL — FARXIGA 10 MG TABLET: 10 | 30 days supply | Qty: 30 | Fill #1

## 2020-05-14 MED FILL — JANUVIA 100 MG TABLET: 100 | 30 days supply | Qty: 30 | Fill #3

## 2020-05-15 ENCOUNTER — Other Ambulatory Visit: Payer: Self-pay | Admitting: Family Medicine

## 2020-05-15 MED FILL — PRAVASTATIN NA 20 MG TAB: 20 | 90 days supply | Qty: 90 | Fill #0

## 2020-05-17 DIAGNOSIS — H35363 Drusen (degenerative) of macula, bilateral: Secondary | ICD-10-CM | POA: Diagnosis not present

## 2020-05-17 DIAGNOSIS — H43822 Vitreomacular adhesion, left eye: Secondary | ICD-10-CM | POA: Diagnosis not present

## 2020-05-17 DIAGNOSIS — E119 Type 2 diabetes mellitus without complications: Secondary | ICD-10-CM | POA: Diagnosis not present

## 2020-05-17 DIAGNOSIS — H21341 Primary cyst of pars plana, right eye: Secondary | ICD-10-CM | POA: Diagnosis not present

## 2020-05-17 DIAGNOSIS — H35371 Puckering of macula, right eye: Secondary | ICD-10-CM | POA: Diagnosis not present

## 2020-05-22 ENCOUNTER — Encounter: Payer: 59 | Admitting: Neurology

## 2020-06-03 ENCOUNTER — Other Ambulatory Visit: Payer: Self-pay | Admitting: Family Medicine

## 2020-06-03 DIAGNOSIS — E291 Testicular hypofunction: Secondary | ICD-10-CM

## 2020-06-03 MED FILL — TESTOSTERONE 50 MG/5GM (1%): 50 MG/5GM | 30 days supply | Qty: 300 | Fill #0

## 2020-06-03 NOTE — Telephone Encounter (Signed)
Requesting: testosterone 50mg /5gm 1% gel Contract: n/a UDS: n/a  Last Visit: 04/16/2020 Next Visit: 10/15/2020 Last Refill: 10/25/2019 #300g and 5RF  Please Advise

## 2020-06-07 ENCOUNTER — Other Ambulatory Visit: Payer: Self-pay | Admitting: Cardiology

## 2020-06-07 ENCOUNTER — Other Ambulatory Visit: Payer: Self-pay | Admitting: Family Medicine

## 2020-06-07 MED FILL — EZETIMIBE 10 MG TABS: 10 | 90 days supply | Qty: 90 | Fill #0

## 2020-06-07 MED FILL — LOSARTAN POTASSIUM 25 MG TA: 25 | 30 days supply | Qty: 30 | Fill #4

## 2020-06-07 MED FILL — CLOPIDOGREL 75 MG TABLET: 75 | 90 days supply | Qty: 90 | Fill #0

## 2020-06-07 MED FILL — JANUVIA 100 MG TABLET: 100 | 30 days supply | Qty: 30 | Fill #0

## 2020-06-07 NOTE — Telephone Encounter (Signed)
Refill sent to pharmacy.   

## 2020-06-24 ENCOUNTER — Other Ambulatory Visit: Payer: Self-pay | Admitting: Family Medicine

## 2020-06-24 DIAGNOSIS — M79601 Pain in right arm: Secondary | ICD-10-CM

## 2020-06-24 DIAGNOSIS — M5412 Radiculopathy, cervical region: Secondary | ICD-10-CM

## 2020-06-24 MED FILL — FARXIGA 10 MG TABLET: 10 | 30 days supply | Qty: 30 | Fill #2

## 2020-06-24 MED FILL — PREGABALIN 75 MG CAPS: 75 | 30 days supply | Qty: 60 | Fill #0

## 2020-06-27 DIAGNOSIS — E119 Type 2 diabetes mellitus without complications: Secondary | ICD-10-CM | POA: Insufficient documentation

## 2020-06-27 DIAGNOSIS — R011 Cardiac murmur, unspecified: Secondary | ICD-10-CM | POA: Insufficient documentation

## 2020-06-27 DIAGNOSIS — J45990 Exercise induced bronchospasm: Secondary | ICD-10-CM | POA: Insufficient documentation

## 2020-06-27 DIAGNOSIS — M1990 Unspecified osteoarthritis, unspecified site: Secondary | ICD-10-CM | POA: Insufficient documentation

## 2020-06-27 DIAGNOSIS — M545 Low back pain, unspecified: Secondary | ICD-10-CM | POA: Insufficient documentation

## 2020-06-27 DIAGNOSIS — D649 Anemia, unspecified: Secondary | ICD-10-CM | POA: Insufficient documentation

## 2020-06-29 NOTE — Progress Notes (Signed)
Cardiology Office Note:    Date:  07/01/2020   ID:  Reginald Rios, DOB [birth year], MRN [id number]  PCP:  [name], DO  Cardiologist:  Shirlee More, MD    Referring MD: [name]*    ASSESSMENT:    1. Coronary artery disease involving native coronary artery of native heart without angina pectoris   2. Hyperlipidemia, unspecified hyperlipidemia type   3. Type 2 diabetes mellitus with diabetic polyneuropathy, with long-term current use of insulin (HCC)    PLAN:    In order of problems listed above:  1. CAD is stable having no angina after PCI on current medical therapy and 1 year ago he had a myocardial perfusion study performed with no findings of ischemia.  He is having no angina we will continue treatment including long-term dual antiplatelet therapy antihypertensive and lipid-lowering with a low intensity statin and Zetia.  Is not a beta-blocker with bradycardia 2. Stable continue current treatment lipids are at target 3. Well-controlled diabetes A1c at target   Next appointment: 6 months   Medication Adjustments/Labs and Tests Ordered: Current medicines are reviewed at length with the patient today.  Concerns regarding medicines are outlined above.  No orders of the defined types were placed in this encounter.  No orders of the defined types were placed in this encounter.   Chief Complaint  Patient presents with  . Follow-up  . Coronary Artery Disease    History of Present Illness:    Reginald Rios is a 65 y.o. male with a hx of CAD with PCI and stenting left circumflex coronary artery 06/23/2017, type 2 diabetes mellitus, hyperlipidemia, sinus bradycardia and previous mild decrease in ejection fraction 50 to 55% most recently March 2021 ejection fraction 57%.  He was last seen 01/15/2020 with stable CAD and normalization of ejection fraction.  His EKG last visit 01/15/2020 showed sinus rhythm 62 bpm otherwise normal.  Compliance  with diet, lifestyle and medications: Yes  He is frustrated he has had bilateral hamstring injuries and is in physical therapy.  Is also having pain and swelling of the joints of his hands and is seeing orthopedics. He is not having symptoms of statin toxicity no muscle pain or weakness. No chest pain shortness of breath palpitation or syncope. Most recent labs shows lipids are ideal with an LDL of 55 cholesterol 131 HDL of 54 A1c is 6.6%. Past Medical History:  Diagnosis Date  . ADD (attention deficit disorder)   . Anemia   . Arthritis   . CAD (coronary artery disease)    2/19 PCI/DESx1 to Lcx  . Diabetes mellitus without complication ([place])   . Exercise-induced asthma   . Heart murmur   . Hyperlipidemia    family hx of high cholesterol  . Hypogonadism in male   . Low back pain   . Osteoarthritis     Past Surgical History:  Procedure Laterality Date  . CORONARY STENT INTERVENTION N/A 06/23/2017   Procedure: CORONARY STENT INTERVENTION;  Surgeon: Leonie Man, MD;  Location: [facility];  Service: Cardiovascular;  Laterality: N/A;  . INTRAVASCULAR PRESSURE WIRE/FFR STUDY N/A 06/23/2017   Procedure: INTRAVASCULAR PRESSURE WIRE/FFR STUDY;  Surgeon: Leonie Man, MD;  Location: [facility] [facility];  Service: Cardiovascular;  Laterality: N/A;  . KNEE ARTHROSCOPY    . LEFT HEART CATH AND CORONARY ANGIOGRAPHY N/A 06/23/2017   Procedure: LEFT HEART CATH AND CORONARY ANGIOGRAPHY;  Surgeon: Leonie Man, MD;  Location: [facility]  [facility];  Service: Cardiovascular;  Laterality: N/A;  . SHOULDER ARTHROSCOPY WITH BICEPSTENOTOMY Right 09/06/2019   Procedure: SHOULDER ARTHROSCOPY WITH BICEPSTENOTOMY;  Surgeon: Leandrew Koyanagi, MD;  Location: [facility];  Service: Orthopedics;  Laterality: Right;  . SHOULDER ARTHROSCOPY WITH SUBACROMIAL DECOMPRESSION Right 09/06/2019   Procedure: RIGHT SHOULDER ARTHROSCOPY WITH EXTENSIVE DEBRIDEMENT, SUBACROMIAL DECOMPRESSION, BICEPS  TENOTOMY;  Surgeon: Leandrew Koyanagi, MD;  Location: [facility];  Service: Orthopedics;  Laterality: Right;  . TENOTOMY ACHILLES TENDON      Current Medications: Current Meds  Medication Sig  . albuterol (VENTOLIN HFA) 108 (90 Base) MCG/ACT inhaler 2 puffs every 4-6 hours as needed for coughing or wheezing spells  . aspirin EC 81 MG tablet Take 81 mg by mouth daily.  . Blood Glucose Monitoring Suppl (FREESTYLE FREEDOM LITE) w/Device KIT Use to check blood sugar daily.  . clopidogrel (PLAVIX) 75 MG tablet TAKE 1 TABLET BY MOUTH DAILY WITH BREAKFAST  . Coenzyme Q10 200 MG capsule Take 200 mg by mouth daily.  Marland Kitchen ezetimibe (ZETIA) 10 MG tablet TAKE 1 TABLET BY MOUTH ONCE DAILY  . FARXIGA 10 MG TABS tablet TAKE 1 TABLET BY MOUTH ONCE DAILY  . glucose blood (FREESTYLE LITE) test strip Use daily to check blood sugar.  Marland Kitchen JANUVIA 100 MG tablet TAKE 1 TABLET BY MOUTH ONCE DAILY  . Lancets (FREESTYLE) lancets Use daily to check blood sugar  . lidocaine (LIDODERM) 5 % Place 1 patch onto the skin daily. Remove & Discard patch within 12 hours or as directed by MD  . losartan (COZAAR) 25 MG tablet TAKE 1 TABLET (25 MG TOTAL) BY MOUTH DAILY.  . metFORMIN (GLUCOPHAGE) 1000 MG tablet TAKE 1 TABLET (1,000 MG TOTAL) BY MOUTH 2 (TWO) TIMES DAILY WITH A MEAL.  . methylphenidate (RITALIN) 20 MG tablet Take 1 tablet (20 mg total) by mouth 2 (two) times daily.  . montelukast (SINGULAIR) 10 MG tablet Take 1 tablet (10 mg total) by mouth daily.  . nitroGLYCERIN (NITROSTAT) 0.4 MG SL tablet Place 1 tablet (0.4 mg total) under the tongue every 5 (five) minutes as needed.  Marland Kitchen omeprazole (PRILOSEC) 20 MG capsule Take 20 mg by mouth every morning.  . pioglitazone (ACTOS) 30 MG tablet TAKE 1 TABLET BY MOUTH ONCE DAILY  . pravastatin (PRAVACHOL) 20 MG tablet TAKE 1 TABLET (20 MG TOTAL) BY MOUTH DAILY.  Marland Kitchen predniSONE (DELTASONE) 20 MG tablet Take 2 tablets (40 mg total) by mouth daily with breakfast for 5 days.  .  pregabalin (LYRICA) 75 MG capsule TAKE 1 CAPSULE (75 MG TOTAL) BY MOUTH 2 (TWO) TIMES DAILY.  Marland Kitchen testosterone (ANDROGEL) 50 MG/5GM (1%) GEL APPLY 2 APPLICATIONS (10 GRAMS) ONTO THE SKIN DAILY     Allergies:   Invokana [canagliflozin], Grass pollen(k-o-r-t-swt vern), and Lisinopril   Social History   Socioeconomic History  . Marital status: Married    Spouse name: Not on file  . Number of children: Not on file  . Years of education: Not on file  . Highest education level: Not on file  Occupational History  . Occupation: Air cabin crew   Tobacco Use  . Smoking status: Never Smoker  . Smokeless tobacco: Never Used  Vaping Use  . Vaping Use: Never used  Substance and Sexual Activity  . Alcohol use: Yes    Alcohol/week: 0.0 standard drinks    Comment: rare  . Drug use: No  . Sexual activity: Not on file  Other Topics Concern  .  Not on file  Social History Narrative   Previously worked as Geophysicist/field seismologist   Currently working at [employer] clinic   Married 30 years   2 children ages 71, 30   Wife is psychiatrist - Dr. Margurite Auerbach   Moved from [place] in [place]   Social Determinants of Health   Financial Resource Strain: Not on file  Food Insecurity: Not on file  Transportation Needs: Not on file  Physical Activity: Not on file  Stress: Not on file  Social Connections: Not on file     Family History: The patient's family history includes Asthma in his paternal aunt; Cancer in his mother; Coronary artery disease (age of onset: 69) in his father; Food Allergy in his son; Heart disease in his father; Hypothyroidism in his brother; Lung cancer (age of onset: 60) in his mother. There is no history of Colon cancer, Prostate cancer, Allergic rhinitis, Angioedema, Eczema, Immunodeficiency, or Urticaria. ROS:   Please see the history of present illness.    All other systems reviewed and are negative.  EKGs/Labs/Other Studies Reviewed:    The following studies were  reviewed today:   Recent Labs: 02/27/2020: Hemoglobin 13.3; Platelets 245.0; TSH 1.40 04/16/2020: ALT 19; BUN 15; Creatinine, Ser 0.77; Potassium 4.2; Sodium 140  Recent Lipid Panel    Component Value Date/Time   CHOL 131 04/16/2020 1311   CHOL 150 04/21/2017 1019   TRIG 110.0 04/16/2020 1311   HDL 53.60 04/16/2020 1311   HDL 53 04/21/2017 1019   CHOLHDL 2 04/16/2020 1311   VLDL 22.0 04/16/2020 1311   LDLCALC 55 04/16/2020 1311   LDLCALC 74 04/21/2017 1019    Physical Exam:    VS:  BP (!) 104/58   Pulse 82   Ht '5\' 10"'  (1.778 m)   Wt 194 lb (88 kg)   SpO2 97%   BMI 27.84 kg/m     Wt Readings from Last 3 Encounters:  07/01/20 194 lb (88 kg)  07/01/20 195 lb 8 oz (88.7 kg)  04/16/20 196 lb (88.9 kg)     GEN:  Well nourished, well developed in no acute distress HEENT: Normal NECK: No JVD; No carotid bruits LYMPHATICS: No lymphadenopathy CARDIAC: RRR, no murmurs, rubs, gallops RESPIRATORY:  Clear to auscultation without rales, wheezing or rhonchi  ABDOMEN: Soft, non-tender, non-distended MUSCULOSKELETAL:  No edema; No deformity  SKIN: Warm and dry NEUROLOGIC:  Alert and oriented x 3 PSYCHIATRIC:  Normal affect    Signed, Shirlee More, MD  07/01/2020 1:32 PM    [facility]

## 2020-07-01 ENCOUNTER — Other Ambulatory Visit: Payer: Self-pay

## 2020-07-01 ENCOUNTER — Ambulatory Visit: Payer: 59 | Admitting: Family Medicine

## 2020-07-01 ENCOUNTER — Other Ambulatory Visit: Payer: Self-pay | Admitting: Family Medicine

## 2020-07-01 ENCOUNTER — Encounter: Payer: Self-pay | Admitting: Family Medicine

## 2020-07-01 ENCOUNTER — Ambulatory Visit: Payer: 59 | Admitting: Cardiology

## 2020-07-01 ENCOUNTER — Encounter: Payer: Self-pay | Admitting: Cardiology

## 2020-07-01 VITALS — BP 120/68 | HR 68 | Temp 98.2°F | Ht 70.0 in | Wt 195.5 lb

## 2020-07-01 VITALS — BP 104/58 | HR 82 | Ht 70.0 in | Wt 194.0 lb

## 2020-07-01 DIAGNOSIS — E1142 Type 2 diabetes mellitus with diabetic polyneuropathy: Secondary | ICD-10-CM

## 2020-07-01 DIAGNOSIS — E785 Hyperlipidemia, unspecified: Secondary | ICD-10-CM

## 2020-07-01 DIAGNOSIS — Z794 Long term (current) use of insulin: Secondary | ICD-10-CM

## 2020-07-01 DIAGNOSIS — I2510 Atherosclerotic heart disease of native coronary artery without angina pectoris: Secondary | ICD-10-CM

## 2020-07-01 DIAGNOSIS — R413 Other amnesia: Secondary | ICD-10-CM

## 2020-07-01 DIAGNOSIS — M79645 Pain in left finger(s): Secondary | ICD-10-CM

## 2020-07-01 MED ORDER — PREDNISONE 20 MG PO TABS: 40.0000 mg | ORAL_TABLET | Freq: Every day | ORAL | 0 refills | Status: DC

## 2020-07-01 MED FILL — predniSONE 20 MG TABS: 20 | 5 days supply | Qty: 10 | Fill #0

## 2020-07-01 NOTE — Patient Instructions (Addendum)
Stop the Lyrica and see what happens. Let me know if you are able to stay off of it. If we need to look further for causes, please return to the clinic to discuss the next steps.   If you do not hear anything about your referral in the next 1-2 weeks, call our office and ask for an update.  Ice/cold pack over area for 10-15 min twice daily.  OK to take Tylenol 1000 mg (2 extra strength tabs) or 975 mg (3 regular strength tabs) every 6 hours as needed.  Let us know if you need anything.

## 2020-07-01 NOTE — Progress Notes (Signed)
Musculoskeletal Exam  Patient: Reginald Rios DOB: 06/17/1955  DOS: 07/01/2020  SUBJECTIVE:  Chief Complaint:   Chief Complaint  Patient presents with  . left thumb nodule    Reginald Rios is a 65 y.o.  male for evaluation and treatment of L thumb pain.   Onset:  10 days ago. No inj or change in activity.  Location: L thumb Character:  aching  Progression of issue:  is unchanged Associated symptoms: decreased ROM, stiffness, swelling; no bruising, swelling Treatment: to date has been ice.   Neurovascular symptoms: no  Over the last several weeks the patient has noticed he is becoming more forgetful in relation to short-term memory.  He still remembers birthdates, names, and does not forget what he is doing.  He takes Lyrica for upper extremity issues.  He is wondering what would happen if he stopped this medication.  Past Medical History:  Diagnosis Date  . ADD (attention deficit disorder)   . Anemia   . Arthritis   . CAD (coronary artery disease)    2/19 PCI/DESx1 to Lcx  . Diabetes mellitus without complication ([place])   . Exercise-induced asthma   . Heart murmur   . Hyperlipidemia    family hx of high cholesterol  . Hypogonadism in male   . Low back pain   . Osteoarthritis     Objective: VITAL SIGNS: BP 120/68 (BP Location: Left Arm, Patient Position: Sitting, Cuff Size: Normal)   Pulse 68   Temp 98.2 F (36.8 C) (Oral)   Ht 5\' 10"  (1.778 m)   Wt 195 lb 8 oz (88.7 kg)   SpO2 98%   BMI 28.05 kg/m  Constitutional: Well formed, well developed. No acute distress. Thorax & Lungs: No accessory muscle use Musculoskeletal: Left arm.   Normal active range of motion: no.   Normal passive range of motion: no Tenderness to palpation: Yes, diffusely around the interphalanx joint Deformity: There is a nodule along the proximal portion of the distal phalanx laterally Ecchymosis: no Neurologic: Normal sensory function.  Psychiatric: Normal mood. Age appropriate  judgment and insight. Alert & oriented x 3.    Assessment:  Pain of left thumb - Plan: predniSONE (DELTASONE) 20 MG tablet, Ambulatory referral to Hand Surgery  Memory changes  Plan: 1.  Refer to hand surgery team, 5-day prednisone burst 40 mg daily. 2.  Okay to stop taking Lyrica for several weeks.  If this does not improve his short-term memory, he will let me know and we will follow-up in the clinic. F/u as originally scheduled. The patient voiced understanding and agreement to the plan.   Blair, DO 07/01/20  11:48 AM

## 2020-07-01 NOTE — Patient Instructions (Signed)

## 2020-07-08 ENCOUNTER — Other Ambulatory Visit: Payer: Self-pay | Admitting: Family Medicine

## 2020-07-08 MED FILL — MONTELUKAST SOD 10 MG TAB: 10 | 90 days supply | Qty: 90 | Fill #0

## 2020-07-08 MED FILL — METHYLPHENIDATE HCL 20 MG T: 20 | 30 days supply | Qty: 60 | Fill #0

## 2020-07-12 ENCOUNTER — Other Ambulatory Visit: Payer: Self-pay

## 2020-07-12 ENCOUNTER — Ambulatory Visit: Payer: 59 | Admitting: Orthopaedic Surgery

## 2020-07-12 ENCOUNTER — Encounter: Payer: Self-pay | Admitting: Orthopaedic Surgery

## 2020-07-12 ENCOUNTER — Telehealth: Payer: Self-pay

## 2020-07-12 ENCOUNTER — Ambulatory Visit (INDEPENDENT_AMBULATORY_CARE_PROVIDER_SITE_OTHER): Payer: 59

## 2020-07-12 DIAGNOSIS — G8929 Other chronic pain: Secondary | ICD-10-CM

## 2020-07-12 DIAGNOSIS — M79645 Pain in left finger(s): Secondary | ICD-10-CM | POA: Diagnosis not present

## 2020-07-12 DIAGNOSIS — I2510 Atherosclerotic heart disease of native coronary artery without angina pectoris: Secondary | ICD-10-CM

## 2020-07-12 MED ORDER — PENNSAID 2 % EX SOLN: 2.0000 g | Freq: Two times a day (BID) | CUTANEOUS | 3 refills | Status: DC | PRN

## 2020-07-12 MED ORDER — DICLOFENAC SODIUM 1.5 % EX SOLN: 2.0000 g | Freq: Two times a day (BID) | CUTANEOUS | 3 refills | Status: DC | PRN

## 2020-07-12 MED FILL — DICLOFENAC 1.5% TOPICAL SOL: 1.5 | 38 days supply | Qty: 150 | Fill #0

## 2020-07-12 NOTE — Progress Notes (Signed)
Office Visit Note   Patient: Reginald Rios           Date of Birth: March 23, 1956           MRN: [id number] Visit Date: 07/12/2020              Requested by: [name], [address] [place],  [place] [address] PCP: [name], DO   Assessment & Plan: Visit Diagnoses:  1. Chronic pain of left thumb     Plan: Impression is left thumb IP joint osteoarthritis.  X-rays reviewed with Reginald Rios today.  Based on findings and his symptoms and discussion on treatment options he will just live with this as it is not overly symptomatic.  I did send in prescription for Pennsaid for him to try.  We will see him back as needed.  Follow-Up Instructions: Return if symptoms worsen or fail to improve.   Orders:  Orders Placed This Encounter  Procedures  . XR Finger Thumb Left   Meds ordered this encounter  Medications  . Diclofenac Sodium (PENNSAID) 2 % SOLN    Sig: Apply 2 g topically 2 (two) times daily as needed (to affected area).    Dispense:  112 g    Refill:  3      Procedures: No procedures performed   Clinical Data: No additional findings.   Subjective: Chief Complaint  Patient presents with  . Left Thumb - Pain    Reginald Rios is a 65 year old gentleman who is well-known to me.  He is a longtime patient of mine and I performed shoulder surgery on him.  He comes in today for evaluation of a separate issue regarding his left thumb.  He noticed a nodule over the IP joint about 2 weeks ago and he has noticed decreased mobility.  He has some pain with bending and some slight swelling.  He has never noticed any cyst formation.   Review of Systems  Constitutional: Negative.   All other systems reviewed and are negative.    Objective: Vital Signs: There were no vitals taken for this visit.  Physical Exam Vitals and nursing note reviewed.  Constitutional:      Appearance: He is well-developed.  Pulmonary:     Effort: Pulmonary effort  is normal.  Abdominal:     Palpations: Abdomen is soft.  Skin:    General: Skin is warm.  Neurological:     Mental Status: He is alert and oriented to person, place, and time.  Psychiatric:        Behavior: Behavior normal.        Thought Content: Thought content normal.        Judgment: Judgment normal.     Ortho Exam Left thumb shows slight radial deviation.  There are no signs of a mucous cyst.  He does have a fairly prominent and palpable osteophyte on the radial dorsal aspect of the IP joint.  He does have mild to moderate restriction in range of motion.  There is no triggering.  Slight tenderness to palpation over the osteophyte.  There are no nail changes. Specialty Comments:  No specialty comments available.  Imaging: XR Finger Thumb Left  Result Date: 07/12/2020 Degenerative changes of the IP joint with slight radial deviation.  Large dorsal osteophytes.    PMFS History: Patient Active Problem List   Diagnosis Date Noted  . Osteoarthritis   . Low back pain   . Heart murmur   .  Exercise-induced asthma   . Diabetes mellitus without complication ([place])   . Arthritis   . Anemia   . Myofascial pain syndrome, cervical 07/28/2019  . Lateral epicondylitis of right elbow 07/28/2019  . Nontraumatic incomplete tear of right rotator cuff 07/25/2019  . Tendinopathy of right biceps tendon 07/25/2019  . Erectile dysfunction 07/11/2019  . Mild persistent asthma/cough variant asthma 04/13/2019  . Allergic rhinitis with a possible nonallergic component 04/13/2019  . Acid reflux 04/13/2019  . Cough 04/13/2019  . Hoarseness 04/13/2019  . Cervical radicular pain 01/10/2019  . Ulnar neuropathy at elbow of right upper extremity 01/03/2019  . Acute bilateral low back pain without sciatica 01/03/2019  . Onychomycosis 05/25/2018  . Palpitations 07/06/2017  . CAD (coronary artery disease) 07/05/2017  . Family history of early CAD 07/05/2017  . Abnormal findings on diagnostic imaging  of cardiovascular system 06/23/2017  . Low vitamin D level 04/22/2017  . Pain in right ankle and joints of right foot 10/05/2016  . Hemarthrosis of right elbow 02/03/2016  . Small thenar eminence 02/03/2016  . Cervical disc disorder with radiculopathy of cervical region 10/07/2015  . Incomplete rotator cuff tear 09/10/2015  . Nonischemic cardiomyopathy ([place]) 05/15/2015  . Fatigue 05/15/2015  . Dyspnea on exertion 05/15/2015  . Decreased cardiac ejection fraction 04/02/2015  . Strain of latissimus dorsi muscle 01/21/2015  . Shoulder bursitis 10/12/2014  . Stenosis of lateral recess of lumbar spine 10/12/2014  . BPPV (benign paroxysmal positional vertigo) 08/14/2014  . Hyperlipidemia 01/19/2014  . Anemia, unspecified 01/19/2014  . ADD (attention deficit disorder) 01/19/2014  . Type 2 diabetes mellitus with hyperglycemia, without long-term current use of insulin ([place]) 12/22/2013  . Hypogonadism in male 12/22/2013  . Arthritis of hand, left 12/22/2013   Past Medical History:  Diagnosis Date  . ADD (attention deficit disorder)   . Anemia   . Arthritis   . CAD (coronary artery disease)    2/19 PCI/DESx1 to Lcx  . Diabetes mellitus without complication ([place])   . Exercise-induced asthma   . Heart murmur   . Hyperlipidemia    family hx of high cholesterol  . Hypogonadism in male   . Low back pain   . Osteoarthritis     Family History  Problem Relation Age of Onset  . Lung cancer Mother 32  . Cancer Mother        lung cancer  . Coronary artery disease Father 26  . Heart disease Father   . Hypothyroidism Brother   . Food Allergy Son   . Asthma Paternal Aunt   . Colon cancer Neg Hx   . Prostate cancer Neg Hx   . Allergic rhinitis Neg Hx   . Angioedema Neg Hx   . Eczema Neg Hx   . Immunodeficiency Neg Hx   . Urticaria Neg Hx     Past Surgical History:  Procedure Laterality Date  . CORONARY STENT INTERVENTION N/A 06/23/2017   Procedure: CORONARY STENT INTERVENTION;   Surgeon: Leonie Man, MD;  Location: [place] [facility];  Service: Cardiovascular;  Laterality: N/A;  . INTRAVASCULAR PRESSURE WIRE/FFR STUDY N/A 06/23/2017   Procedure: INTRAVASCULAR PRESSURE WIRE/FFR STUDY;  Surgeon: Leonie Man, MD;  Location: [facility];  Service: Cardiovascular;  Laterality: N/A;  . KNEE ARTHROSCOPY    . LEFT HEART CATH AND CORONARY ANGIOGRAPHY N/A 06/23/2017   Procedure: LEFT HEART CATH AND CORONARY ANGIOGRAPHY;  Surgeon: Leonie Man, MD;  Location: [facility];  Service: Cardiovascular;  Laterality: N/A;  . SHOULDER ARTHROSCOPY WITH BICEPSTENOTOMY Right 09/06/2019   Procedure: SHOULDER ARTHROSCOPY WITH BICEPSTENOTOMY;  Surgeon: Leandrew Koyanagi, MD;  Location: [facility];  Service: Orthopedics;  Laterality: Right;  . SHOULDER ARTHROSCOPY WITH SUBACROMIAL DECOMPRESSION Right 09/06/2019   Procedure: RIGHT SHOULDER ARTHROSCOPY WITH EXTENSIVE DEBRIDEMENT, SUBACROMIAL DECOMPRESSION, BICEPS TENOTOMY;  Surgeon: Leandrew Koyanagi, MD;  Location: [facility];  Service: Orthopedics;  Laterality: Right;  . TENOTOMY ACHILLES TENDON     Social History   Occupational History  . Occupation: Air cabin crew   Tobacco Use  . Smoking status: Never Smoker  . Smokeless tobacco: Never Used  Vaping Use  . Vaping Use: Never used  Substance and Sexual Activity  . Alcohol use: Yes    Alcohol/week: 0.0 standard drinks    Comment: rare  . Drug use: No  . Sexual activity: Not on file

## 2020-07-12 NOTE — Telephone Encounter (Signed)
Patient called he stated rx for diclofenac was sent to the wrong pharmacy patient stated rx needs to be resent to [facility] call back:[phone]

## 2020-07-15 ENCOUNTER — Telehealth: Payer: Self-pay | Admitting: Orthopaedic Surgery

## 2020-07-15 NOTE — Telephone Encounter (Signed)
Pt would like a refill on diclofenac gel!!

## 2020-07-15 NOTE — Telephone Encounter (Signed)
FYI Patient called advised the Rx was received a [place] and all is ok.

## 2020-07-15 NOTE — Telephone Encounter (Signed)
Wrong chart- Correct Chart MRN: [id number]

## 2020-07-15 NOTE — Telephone Encounter (Signed)
Ileana called from [employer] and was calling for a prior auth for pennsaid.CB [phone]

## 2020-07-17 ENCOUNTER — Telehealth: Payer: Self-pay

## 2020-07-17 NOTE — Telephone Encounter (Signed)
Patients insurance calling stating that Pennsaid needs prior auth on Cover My Meds KEY: [id number]

## 2020-07-18 ENCOUNTER — Telehealth: Payer: Self-pay

## 2020-07-18 NOTE — Telephone Encounter (Signed)
Per patient he picked up and paid $10 they never mentioned PA was needed.

## 2020-07-18 NOTE — Telephone Encounter (Signed)
Patient called regarding last message patient stated rx for penssaid allows 3 refills until 3/33/2023 call [phone]

## 2020-07-18 NOTE — Telephone Encounter (Signed)
Patients insurance calling stating that Pennsaid needs prior auth on Cover My Meds KEY: [id number]

## 2020-07-18 NOTE — Telephone Encounter (Signed)
WRONG CHART  CORRECT CHART [id number]

## 2020-07-24 ENCOUNTER — Other Ambulatory Visit: Payer: Self-pay

## 2020-07-24 MED ORDER — DICLOFENAC SODIUM 1 % EX GEL: 2.0000 g | Freq: Four times a day (QID) | CUTANEOUS | 0 refills | Status: AC

## 2020-07-27 DIAGNOSIS — M542 Cervicalgia: Secondary | ICD-10-CM | POA: Diagnosis not present

## 2020-07-30 MED FILL — LOSARTAN POTASSIUM 25 MG TA: 25 | 30 days supply | Qty: 30 | Fill #5

## 2020-07-30 MED FILL — FARXIGA 10 MG TABLET: 10 | 30 days supply | Qty: 30 | Fill #3

## 2020-08-04 ENCOUNTER — Other Ambulatory Visit (HOSPITAL_BASED_OUTPATIENT_CLINIC_OR_DEPARTMENT_OTHER): Payer: Self-pay

## 2020-08-06 ENCOUNTER — Other Ambulatory Visit (HOSPITAL_BASED_OUTPATIENT_CLINIC_OR_DEPARTMENT_OTHER): Payer: Self-pay

## 2020-08-06 MED FILL — Testosterone TD Gel 50 MG/5GM (1%): TRANSDERMAL | 30 days supply | Qty: 300 | Fill #0 | Status: AC

## 2020-08-18 MED FILL — Pravastatin Sodium Tab 20 MG: ORAL | 90 days supply | Qty: 90 | Fill #0 | Status: AC

## 2020-08-18 MED FILL — Sitagliptin Phosphate Tab 100 MG (Base Equiv): ORAL | 30 days supply | Qty: 30 | Fill #0 | Status: AC

## 2020-08-18 MED FILL — Metformin HCl Tab 1000 MG: ORAL | 90 days supply | Qty: 180 | Fill #0 | Status: AC

## 2020-08-19 ENCOUNTER — Other Ambulatory Visit (HOSPITAL_BASED_OUTPATIENT_CLINIC_OR_DEPARTMENT_OTHER): Payer: Self-pay

## 2020-08-19 ENCOUNTER — Ambulatory Visit: Payer: 59 | Attending: Internal Medicine

## 2020-08-19 DIAGNOSIS — Z23 Encounter for immunization: Secondary | ICD-10-CM

## 2020-08-19 NOTE — Progress Notes (Signed)
   Covid-19 Vaccination Clinic  Name:  Reginald Rios    MRN: [id number] DOB: 10-29-1955  08/19/2020  [name] was observed post Covid-19 immunization for 15 minutes without incident. He was provided with Vaccine Information Sheet and instruction to access the V-Safe system.   [name] was instructed to call 911 with any severe reactions post vaccine: Marland Kitchen Difficulty breathing  . Swelling of face and throat  . A fast heartbeat  . A bad rash all over body  . Dizziness and weakness   Immunizations Administered    Name Date Dose VIS Date Route   PFIZER Comrnaty(Gray TOP) Covid-19 Vaccine 08/19/2020 12:02 PM 0.3 mL 04/11/2020 Intramuscular   Manufacturer: [employer], [employer]   Lot: [id number]   [place]: [phone]

## 2020-08-21 ENCOUNTER — Encounter: Payer: Self-pay | Admitting: Podiatry

## 2020-08-22 ENCOUNTER — Other Ambulatory Visit (HOSPITAL_BASED_OUTPATIENT_CLINIC_OR_DEPARTMENT_OTHER): Payer: Self-pay

## 2020-08-22 MED ORDER — PFIZER-BIONT COVID-19 VAC-TRIS 30 MCG/0.3ML IM SUSP
INTRAMUSCULAR | 0 refills | Status: DC
  Filled 2020-08-22: qty 0.3, 1d supply, fill #0

## 2020-08-23 ENCOUNTER — Other Ambulatory Visit (HOSPITAL_BASED_OUTPATIENT_CLINIC_OR_DEPARTMENT_OTHER): Payer: Self-pay

## 2020-09-01 ENCOUNTER — Other Ambulatory Visit: Payer: Self-pay | Admitting: Cardiology

## 2020-09-01 MED FILL — Dapagliflozin Propanediol Tab 10 MG (Base Equivalent): ORAL | 30 days supply | Qty: 30 | Fill #0 | Status: AC

## 2020-09-02 ENCOUNTER — Other Ambulatory Visit (HOSPITAL_BASED_OUTPATIENT_CLINIC_OR_DEPARTMENT_OTHER): Payer: Self-pay

## 2020-09-02 MED ORDER — LOSARTAN POTASSIUM 25 MG PO TABS
25.0000 mg | ORAL_TABLET | Freq: Every day | ORAL | 2 refills | Status: DC
  Filled 2020-09-02: qty 90, 90d supply, fill #0
  Filled 2020-12-09: qty 90, 90d supply, fill #1
  Filled 2021-03-13: qty 90, 90d supply, fill #2

## 2020-09-16 ENCOUNTER — Other Ambulatory Visit: Payer: Self-pay | Admitting: Family Medicine

## 2020-09-16 ENCOUNTER — Other Ambulatory Visit (HOSPITAL_BASED_OUTPATIENT_CLINIC_OR_DEPARTMENT_OTHER): Payer: Self-pay

## 2020-09-16 MED ORDER — METHYLPHENIDATE HCL 20 MG PO TABS: 20.0000 mg | ORAL_TABLET | Freq: Two times a day (BID) | ORAL | 0 refills | Status: DC

## 2020-09-16 MED ORDER — METHYLPHENIDATE HCL 20 MG PO TABS
ORAL_TABLET | Freq: Two times a day (BID) | ORAL | 0 refills | Status: DC
  Filled 2020-09-16: qty 60, 30d supply, fill #0

## 2020-09-16 MED ORDER — METHYLPHENIDATE HCL 20 MG PO TABS
20.0000 mg | ORAL_TABLET | Freq: Two times a day (BID) | ORAL | 0 refills | Status: DC
  Filled 2020-11-19: qty 60, 30d supply, fill #0

## 2020-09-16 MED FILL — Ezetimibe Tab 10 MG: ORAL | 90 days supply | Qty: 90 | Fill #0 | Status: AC

## 2020-09-16 MED FILL — Clopidogrel Bisulfate Tab 75 MG (Base Equiv): ORAL | 90 days supply | Qty: 90 | Fill #0 | Status: AC

## 2020-09-16 MED FILL — Sitagliptin Phosphate Tab 100 MG (Base Equiv): ORAL | 30 days supply | Qty: 30 | Fill #1 | Status: AC

## 2020-09-16 NOTE — Telephone Encounter (Signed)
Requesting: Ritalin 20mg   Contract: None UDS: 01/10/2019 Last Visit: 07/01/20 Next Visit: 10/15/20  Last Refill: 04/16/2020 #60 and 0RF Pt sig: 1 tab bid  Please Advise

## 2020-09-24 ENCOUNTER — Other Ambulatory Visit (HOSPITAL_BASED_OUTPATIENT_CLINIC_OR_DEPARTMENT_OTHER): Payer: Self-pay

## 2020-09-24 MED FILL — Testosterone TD Gel 50 MG/5GM (1%): TRANSDERMAL | 30 days supply | Qty: 300 | Fill #1 | Status: AC

## 2020-10-03 ENCOUNTER — Other Ambulatory Visit (HOSPITAL_BASED_OUTPATIENT_CLINIC_OR_DEPARTMENT_OTHER): Payer: Self-pay

## 2020-10-03 MED FILL — Dapagliflozin Propanediol Tab 10 MG (Base Equivalent): ORAL | 30 days supply | Qty: 30 | Fill #1 | Status: CN

## 2020-10-11 ENCOUNTER — Other Ambulatory Visit (HOSPITAL_BASED_OUTPATIENT_CLINIC_OR_DEPARTMENT_OTHER): Payer: Self-pay

## 2020-10-14 ENCOUNTER — Other Ambulatory Visit (HOSPITAL_BASED_OUTPATIENT_CLINIC_OR_DEPARTMENT_OTHER): Payer: Self-pay

## 2020-10-14 MED FILL — Montelukast Sodium Tab 10 MG (Base Equiv): ORAL | 90 days supply | Qty: 90 | Fill #0 | Status: AC

## 2020-10-15 ENCOUNTER — Ambulatory Visit (INDEPENDENT_AMBULATORY_CARE_PROVIDER_SITE_OTHER): Payer: 59 | Admitting: Family Medicine

## 2020-10-15 ENCOUNTER — Other Ambulatory Visit: Payer: Self-pay

## 2020-10-15 ENCOUNTER — Encounter: Payer: Self-pay | Admitting: Family Medicine

## 2020-10-15 VITALS — BP 118/72 | HR 88 | Temp 98.0°F | Ht 70.0 in | Wt 194.0 lb

## 2020-10-15 DIAGNOSIS — I428 Other cardiomyopathies: Secondary | ICD-10-CM | POA: Diagnosis not present

## 2020-10-15 DIAGNOSIS — Z125 Encounter for screening for malignant neoplasm of prostate: Secondary | ICD-10-CM

## 2020-10-15 DIAGNOSIS — E1165 Type 2 diabetes mellitus with hyperglycemia: Secondary | ICD-10-CM

## 2020-10-15 DIAGNOSIS — Z0000 Encounter for general adult medical examination without abnormal findings: Secondary | ICD-10-CM | POA: Diagnosis not present

## 2020-10-15 DIAGNOSIS — Z1159 Encounter for screening for other viral diseases: Secondary | ICD-10-CM | POA: Diagnosis not present

## 2020-10-15 LAB — LIPID PANEL
Cholesterol: 145 mg/dL (ref 0–200)
HDL: 48.5 mg/dL (ref >39.00)
LDL Cholesterol: 74 mg/dL (ref 0–99)
NonHDL: 96.26
Total CHOL/HDL Ratio: 3
Triglycerides: 109 mg/dL (ref 0.0–149.0)
VLDL: 21.8 mg/dL (ref 0.0–40.0)

## 2020-10-15 LAB — COMPREHENSIVE METABOLIC PANEL
ALT: 22 U/L (ref 0–53)
AST: 13 U/L (ref 0–37)
Albumin: 4.4 g/dL (ref 3.5–5.2)
Alkaline Phosphatase: 80 U/L (ref 39–117)
BUN: 12 mg/dL (ref 6–23)
CO2: 26 mEq/L (ref 19–32)
Calcium: 9.5 mg/dL (ref 8.4–10.5)
Chloride: 102 mEq/L (ref 96–112)
Creatinine, Ser: 0.76 mg/dL (ref 0.40–1.50)
GFR: 93.07 mL/min (ref >60.00)
Glucose, Bld: 168 mg/dL — ABNORMAL HIGH (ref 70–99)
Potassium: 4.1 mEq/L (ref 3.5–5.1)
Sodium: 139 mEq/L (ref 135–145)
Total Bilirubin: 0.4 mg/dL (ref 0.2–1.2)
Total Protein: 7 g/dL (ref 6.0–8.3)

## 2020-10-15 LAB — HEMOGLOBIN A1C: Hgb A1c MFr Bld: 8.4 % — ABNORMAL HIGH (ref 4.6–6.5)

## 2020-10-15 LAB — PSA: PSA: 1.47 ng/mL (ref 0.10–4.00)

## 2020-10-15 NOTE — Progress Notes (Signed)
Chief Complaint  Patient presents with   Annual Exam    Well Male Reginald Rios is here for a complete physical.   His last physical was >1 year ago.  Current diet: in general, diet is fair.   Current exercise: limited due to heat, some walking Weight trend: stable Fatigue out of ordinary? No. Seat belt? Yes.   Loss of interested in doing things or depression in past 2 weeks? No  Health maintenance Shingrix- No Colonoscopy- Yes Tetanus- Yes Hep C- No Pneumonia vaccine- Yes  Past Medical History:  Diagnosis Date   ADD (attention deficit disorder)    Anemia    Arthritis    CAD (coronary artery disease)    2/19 PCI/DESx1 to Lcx   Diabetes mellitus without complication (HCC)    Exercise-induced asthma    Heart murmur    Hyperlipidemia    family hx of high cholesterol   Hypogonadism in male    Low back pain    Osteoarthritis      Past Surgical History:  Procedure Laterality Date   CORONARY STENT INTERVENTION N/A 06/23/2017   Procedure: CORONARY STENT INTERVENTION;  Surgeon: Leonie Man, MD;  Location: [facility];  Service: Cardiovascular;  Laterality: N/A;   INTRAVASCULAR PRESSURE WIRE/FFR STUDY N/A 06/23/2017   Procedure: INTRAVASCULAR PRESSURE WIRE/FFR STUDY;  Surgeon: Leonie Man, MD;  Location: [facility];  Service: Cardiovascular;  Laterality: N/A;   KNEE ARTHROSCOPY     LEFT HEART CATH AND CORONARY ANGIOGRAPHY N/A 06/23/2017   Procedure: LEFT HEART CATH AND CORONARY ANGIOGRAPHY;  Surgeon: Leonie Man, MD;  Location: [facility];  Service: Cardiovascular;  Laterality: N/A;   SHOULDER ARTHROSCOPY WITH BICEPSTENOTOMY Right 09/06/2019   Procedure: SHOULDER ARTHROSCOPY WITH BICEPSTENOTOMY;  Surgeon: Leandrew Koyanagi, MD;  Location: [facility];  Service: Orthopedics;  Laterality: Right;   SHOULDER ARTHROSCOPY WITH SUBACROMIAL DECOMPRESSION Right 09/06/2019   Procedure: RIGHT SHOULDER ARTHROSCOPY WITH EXTENSIVE DEBRIDEMENT,  SUBACROMIAL DECOMPRESSION, BICEPS TENOTOMY;  Surgeon: Leandrew Koyanagi, MD;  Location: [facility];  Service: Orthopedics;  Laterality: Right;   TENOTOMY ACHILLES TENDON      Medications  Current Outpatient Medications on File Prior to Visit  Medication Sig Dispense Refill   albuterol (VENTOLIN HFA) 108 (90 Base) MCG/ACT inhaler 2 puffs every 4-6 hours as needed for coughing or wheezing spells 18 g 1   aspirin EC 81 MG tablet Take 81 mg by mouth daily.     Blood Glucose Monitoring Suppl (FREESTYLE FREEDOM LITE) w/Device KIT Use to check blood sugar daily. 1 kit 0   clopidogrel (PLAVIX) 75 MG tablet TAKE 1 TABLET BY MOUTH DAILY WITH BREAKFAST 90 tablet 1   Coenzyme Q10 200 MG capsule Take 200 mg by mouth daily.     COVID-19 mRNA Vac-TriS, Pfizer, (PFIZER-BIONT COVID-19 VAC-TRIS) SUSP injection Inject into the muscle. 0.3 mL 0   dapagliflozin propanediol (FARXIGA) 10 MG TABS tablet TAKE 1 TABLET BY MOUTH ONCE DAILY 30 tablet 5   diclofenac Sodium (VOLTAREN) 1 % GEL Apply 2 g topically 4 (four) times daily. 100 g 0   Diclofenac Sodium 1.5 % SOLN APPLY 2 GRAMS ON TO THE AFFECTED AREA 2 TIMES DAILY 150 mL 3   ezetimibe (ZETIA) 10 MG tablet TAKE 1 TABLET BY MOUTH ONCE DAILY 90 tablet 1   fluticasone (FLOVENT HFA) 110 MCG/ACT inhaler Inhale 2 puffs into the lungs 2 (two) times daily. Use with spacer. Rinse, gargle and  spit out after use 1 Inhaler 5   glucose blood (FREESTYLE LITE) test strip Use daily to check blood sugar. 100 each 3   Lancets (FREESTYLE) lancets Use daily to check blood sugar 100 each 3   lidocaine (LIDODERM) 5 % Place 1 patch onto the skin daily. Remove & Discard patch within 12 hours or as directed by MD 30 patch 0   losartan (COZAAR) 25 MG tablet Take 1 tablet (25 mg total) by mouth daily. 90 tablet 2   metFORMIN (GLUCOPHAGE) 1000 MG tablet TAKE 1 TABLET BY MOUTH 2 TIMES DAILY WITH A MEAL 180 tablet 3   methylphenidate (RITALIN) 20 MG tablet TAKE 1 TABLET BY MOUTH  TWICE DAILY 60 tablet 0   [START ON 10/16/2020] methylphenidate (RITALIN) 20 MG tablet Take 1 tablet (20 mg total) by mouth 2 (two) times daily. 60 tablet 0   [START ON 11/15/2020] methylphenidate (RITALIN) 20 MG tablet Take 1 tablet (20 mg total) by mouth 2 (two) times daily. 60 tablet 0   montelukast (SINGULAIR) 10 MG tablet TAKE 1 TABLET (10 MG TOTAL) BY MOUTH DAILY. 90 tablet 3   nitroGLYCERIN (NITROSTAT) 0.4 MG SL tablet Place 1 tablet (0.4 mg total) under the tongue every 5 (five) minutes as needed. 25 tablet 2   omeprazole (PRILOSEC) 20 MG capsule Take 20 mg by mouth every morning.     pioglitazone (ACTOS) 30 MG tablet TAKE 1 TABLET BY MOUTH ONCE DAILY 30 tablet 3   pravastatin (PRAVACHOL) 20 MG tablet TAKE 1 TABLET (20 MG TOTAL) BY MOUTH DAILY. 90 tablet 2   predniSONE (DELTASONE) 20 MG tablet TAKE 2 TABLETS (40 MG TOTAL) BY MOUTH DAILY WITH BREAKFAST FOR 5 DAYS. 10 tablet 0   pregabalin (LYRICA) 75 MG capsule TAKE 1 CAPSULE (75 MG TOTAL) BY MOUTH 2 (TWO) TIMES DAILY. 60 capsule 2   sitaGLIPtin (JANUVIA) 100 MG tablet TAKE 1 TABLET BY MOUTH ONCE DAILY 30 tablet 3   tadalafil (CIALIS) 20 MG tablet Take 0.5-1 tablets (10-20 mg total) by mouth every other day as needed for erectile dysfunction. 10 tablet 5   testosterone (ANDROGEL) 50 MG/5GM (1%) GEL APPLY 2 APPLICATIONS ON TO THE SKIN DAILY 300 g 5    Allergies Allergies  Allergen Reactions   Invokana [Canagliflozin] Rash   Grass Pollen(K-O-R-T-Swt Vern)    Lisinopril Cough    Family History Family History  Problem Relation Age of Onset   Lung cancer Mother 10   Cancer Mother        lung cancer   Coronary artery disease Father 61   Heart disease Father    Hypothyroidism Brother    Food Allergy Son    Asthma Paternal Aunt    Colon cancer Neg Hx    Prostate cancer Neg Hx    Allergic rhinitis Neg Hx    Angioedema Neg Hx    Eczema Neg Hx    Immunodeficiency Neg Hx    Urticaria Neg Hx     Review of Systems: Constitutional:   no fevers Eye:  no recent significant change in vision Ears:  No changes in hearing Nose/Mouth/Throat:  no complaints of nasal congestion, no sore throat Cardiovascular: no chest pain Respiratory:  No shortness of breath Gastrointestinal:  No change in bowel habits GU:  No frequency Integumentary:  no abnormal skin lesions reported Neurologic:  no headaches Endocrine:  denies unexplained weight changes  Exam BP 118/72   Pulse 88   Temp 98 F (36.7 C) (Oral)  Ht _0  (1.778 m)   Wt 194 lb (88 kg)   SpO2 99%   BMI 27.84 kg/m  General:  well developed, well nourished, in no apparent distress Skin:  no significant moles, warts, or growths Head:  no masses, lesions, or tenderness Eyes:  pupils equal and round, sclera anicteric without injection Ears:  canals without lesions, TMs shiny without retraction, no obvious effusion, no erythema Nose:  nares patent, septum midline, mucosa normal Throat/Pharynx:  lips and gingiva without lesion; tongue and uvula midline; non-inflamed pharynx; no exudates or postnasal drainage Lungs:  clear to auscultation, breath sounds equal bilaterally, no respiratory distress Cardio:  regular rate and rhythm, no LE edema or bruits Rectal: Deferred GI: BS+, S, NT, ND, no masses or organomegaly Musculoskeletal:  symmetrical muscle groups noted without atrophy or deformity Neuro:  gait normal; deep tendon reflexes normal and symmetric Psych: well oriented with normal range of affect and appropriate judgment/insight  Assessment and Plan  Well adult exam  Nonischemic cardiomyopathy ([place]), Chronic  Encounter for hepatitis C screening test for low risk patient - Plan: Hepatitis C antibody  Type 2 diabetes mellitus with hyperglycemia, without long-term current use of insulin (HCC) - Plan: Comprehensive metabolic panel, Hemoglobin A1c, Lipid panel  Screening for prostate cancer - Plan: PSA   Well 65 y.o. male. Counseled on diet and exercise. Pt  agrees to undergo screening PSA.  Other orders as above. Follow up in 6 mo.  The patient voiced understanding and agreement to the plan.  [place], DO 10/15/20 12:49 PM

## 2020-10-15 NOTE — Patient Instructions (Addendum)
Give [place] 2-3 business days to get the results of your labs back.   Keep the diet clean and stay active.  The new Shingrix vaccine (for shingles) is a 2 shot series. It can make people feel low energy, achy and almost like they have the flu for 48 hours after injection. Please plan accordingly when deciding on when to get this shot. Call our office for a nurse visit appointment to get this. The second shot of the series is less severe regarding the side effects, but it still lasts 48 hours.   Get back on your flonase.  Let us know if you need anything.

## 2020-10-16 ENCOUNTER — Other Ambulatory Visit: Payer: Self-pay | Admitting: Family Medicine

## 2020-10-16 ENCOUNTER — Other Ambulatory Visit (HOSPITAL_BASED_OUTPATIENT_CLINIC_OR_DEPARTMENT_OTHER): Payer: Self-pay

## 2020-10-16 LAB — HEPATITIS C ANTIBODY
Hepatitis C Ab: NONREACTIVE
SIGNAL TO CUT-OFF: 0.02 (ref <1.00)

## 2020-10-16 MED ORDER — FLUTICASONE PROPIONATE HFA 110 MCG/ACT IN AERO
2.0000 | INHALATION_SPRAY | Freq: Two times a day (BID) | RESPIRATORY_TRACT | 5 refills | Status: DC
  Filled 2020-10-16: qty 12, 30d supply, fill #0

## 2020-10-16 MED ORDER — PIOGLITAZONE HCL 45 MG PO TABS
45.0000 mg | ORAL_TABLET | Freq: Every day | ORAL | 5 refills | Status: DC
  Filled 2020-10-16: qty 30, 30d supply, fill #0
  Filled 2020-11-19: qty 30, 30d supply, fill #1
  Filled 2020-12-23: qty 30, 30d supply, fill #2
  Filled 2021-01-16: qty 30, 30d supply, fill #3
  Filled 2021-02-21: qty 30, 30d supply, fill #4
  Filled 2021-03-24: qty 30, 30d supply, fill #5

## 2020-10-17 ENCOUNTER — Other Ambulatory Visit (HOSPITAL_BASED_OUTPATIENT_CLINIC_OR_DEPARTMENT_OTHER): Payer: Self-pay

## 2020-10-18 ENCOUNTER — Other Ambulatory Visit (HOSPITAL_BASED_OUTPATIENT_CLINIC_OR_DEPARTMENT_OTHER): Payer: Self-pay

## 2020-10-28 ENCOUNTER — Other Ambulatory Visit (HOSPITAL_BASED_OUTPATIENT_CLINIC_OR_DEPARTMENT_OTHER): Payer: Self-pay

## 2020-10-28 ENCOUNTER — Other Ambulatory Visit: Payer: Self-pay | Admitting: Family Medicine

## 2020-10-28 MED ORDER — SITAGLIPTIN PHOSPHATE 100 MG PO TABS
ORAL_TABLET | Freq: Every day | ORAL | 3 refills | Status: DC
Start: 2020-10-28 — End: 2021-03-10
  Filled 2020-10-28: qty 30, 30d supply, fill #0
  Filled 2020-11-19 – 2020-11-20 (×2): qty 30, 30d supply, fill #1
  Filled 2020-12-23: qty 30, 30d supply, fill #2
  Filled 2021-02-03 – 2021-02-17 (×2): qty 30, 30d supply, fill #3

## 2020-11-19 ENCOUNTER — Other Ambulatory Visit (HOSPITAL_BASED_OUTPATIENT_CLINIC_OR_DEPARTMENT_OTHER): Payer: Self-pay

## 2020-11-19 MED FILL — Pravastatin Sodium Tab 20 MG: ORAL | 90 days supply | Qty: 90 | Fill #1 | Status: AC

## 2020-11-19 MED FILL — Testosterone TD Gel 50 MG/5GM (1%): TRANSDERMAL | 30 days supply | Qty: 300 | Fill #2 | Status: AC

## 2020-11-20 ENCOUNTER — Other Ambulatory Visit (HOSPITAL_BASED_OUTPATIENT_CLINIC_OR_DEPARTMENT_OTHER): Payer: Self-pay

## 2020-11-21 ENCOUNTER — Other Ambulatory Visit (HOSPITAL_BASED_OUTPATIENT_CLINIC_OR_DEPARTMENT_OTHER): Payer: Self-pay

## 2020-12-02 DIAGNOSIS — M9903 Segmental and somatic dysfunction of lumbar region: Secondary | ICD-10-CM | POA: Diagnosis not present

## 2020-12-02 DIAGNOSIS — M542 Cervicalgia: Secondary | ICD-10-CM | POA: Diagnosis not present

## 2020-12-02 DIAGNOSIS — M545 Low back pain, unspecified: Secondary | ICD-10-CM | POA: Diagnosis not present

## 2020-12-02 DIAGNOSIS — M9901 Segmental and somatic dysfunction of cervical region: Secondary | ICD-10-CM | POA: Diagnosis not present

## 2020-12-09 ENCOUNTER — Other Ambulatory Visit (HOSPITAL_BASED_OUTPATIENT_CLINIC_OR_DEPARTMENT_OTHER): Payer: Self-pay

## 2020-12-09 MED FILL — Metformin HCl Tab 1000 MG: ORAL | 90 days supply | Qty: 180 | Fill #1 | Status: AC

## 2020-12-16 ENCOUNTER — Other Ambulatory Visit (HOSPITAL_BASED_OUTPATIENT_CLINIC_OR_DEPARTMENT_OTHER): Payer: Self-pay

## 2020-12-23 ENCOUNTER — Other Ambulatory Visit (HOSPITAL_BASED_OUTPATIENT_CLINIC_OR_DEPARTMENT_OTHER): Payer: Self-pay

## 2020-12-23 ENCOUNTER — Other Ambulatory Visit: Payer: Self-pay | Admitting: Cardiology

## 2020-12-23 MED ORDER — CLOPIDOGREL BISULFATE 75 MG PO TABS
ORAL_TABLET | Freq: Every day | ORAL | 1 refills | Status: DC
  Filled 2020-12-23: qty 90, 90d supply, fill #0
  Filled 2021-03-24: qty 90, 90d supply, fill #1

## 2020-12-23 MED ORDER — EZETIMIBE 10 MG PO TABS
ORAL_TABLET | Freq: Every day | ORAL | 1 refills | Status: DC
  Filled 2020-12-23: qty 90, 90d supply, fill #0
  Filled 2021-03-24: qty 90, 90d supply, fill #1

## 2020-12-24 DIAGNOSIS — H5213 Myopia, bilateral: Secondary | ICD-10-CM | POA: Diagnosis not present

## 2021-01-16 ENCOUNTER — Other Ambulatory Visit: Payer: Self-pay | Admitting: Family Medicine

## 2021-01-16 ENCOUNTER — Other Ambulatory Visit (HOSPITAL_BASED_OUTPATIENT_CLINIC_OR_DEPARTMENT_OTHER): Payer: Self-pay

## 2021-01-16 MED ORDER — METHYLPHENIDATE HCL 20 MG PO TABS: 20.0000 mg | ORAL_TABLET | Freq: Two times a day (BID) | ORAL | 0 refills | Status: DC

## 2021-01-16 MED ORDER — METHYLPHENIDATE HCL 20 MG PO TABS
20.0000 mg | ORAL_TABLET | Freq: Two times a day (BID) | ORAL | 0 refills | Status: DC
  Filled 2021-03-18: qty 60, 30d supply, fill #0

## 2021-01-16 MED ORDER — METHYLPHENIDATE HCL 20 MG PO TABS
ORAL_TABLET | Freq: Two times a day (BID) | ORAL | 0 refills | Status: DC
  Filled 2021-01-16: qty 60, 30d supply, fill #0

## 2021-01-16 MED FILL — Montelukast Sodium Tab 10 MG (Base Equiv): ORAL | 90 days supply | Qty: 90 | Fill #1 | Status: AC

## 2021-01-16 NOTE — Telephone Encounter (Signed)
Requesting: Ritalin Contract: 04/16/20 UDS: 04/16/20 Last Visit: 10/15/20 Next Visit: 01/17/21 Last Refill: 11/15/20  Please Advise

## 2021-01-17 ENCOUNTER — Encounter: Payer: Self-pay | Admitting: Family Medicine

## 2021-01-17 ENCOUNTER — Ambulatory Visit: Payer: 59 | Admitting: Family Medicine

## 2021-01-17 ENCOUNTER — Other Ambulatory Visit: Payer: Self-pay

## 2021-01-17 VITALS — BP 108/76 | HR 59 | Temp 98.4°F | Ht 70.0 in | Wt 203.4 lb

## 2021-01-17 DIAGNOSIS — E1165 Type 2 diabetes mellitus with hyperglycemia: Secondary | ICD-10-CM

## 2021-01-17 DIAGNOSIS — D489 Neoplasm of uncertain behavior, unspecified: Secondary | ICD-10-CM | POA: Diagnosis not present

## 2021-01-17 DIAGNOSIS — B079 Viral wart, unspecified: Secondary | ICD-10-CM | POA: Diagnosis not present

## 2021-01-17 NOTE — Progress Notes (Signed)
FOLLOW UP Date of Service/Encounter:  01/20/21   Subjective:   Chief Complaint  Patient presents with   Cough    Reginald Rios (DOB: 16-Dec-1955) is a 65 y.o. male who returns to the [facility] on 01/20/2021 in re-evaluation of the following: chronic cough History obtained from: chart review and patient.  Chronic cough:  Feels cough has increased.  Previously cough was dry.  He would have a tickle and cough, used the inhaler (albuterol), and he was fine.  He was using it 3 or 4 times per week.  He has a tickle in his throat.  Strong smells and dry air cause him to cough.  He doesn't feel like he has a lot of drainage. It is slightly more productive than last visit.  He has seen his PCP recently, who did not have concern. He did use the Flovent, and used a spacer, but didn't want to use it because he has had thrush in the past. He has diabetes and a history of heart disease. He does have a history of reflux.  Cough has happened during dinner on a few occasions.  He does occasionally get heartburn.  Drinking hot black tea with milk 3-4 times per day as well diet coke.  He is trying to go to plant based diet. He does have fluticasone nasal spray that he uses.  Does not use azelastine.  Stays congested. He reports globus sensation.  For Review, LV was on 04/13/19  with Dr. Verlin Fester seen for cough variant asthma started on Flovent 110 mcg, 2 puffs BID.  FEV1/FVC 120% pre and post, and FEV1 91% pre and 102% post with 12% reversibility following bronchodilator.  SPT at that visit with borderline ID to grass pollens, major mold mix #4 and cockroach.  Allergies as of 01/20/2021       Reactions   Invokana [canagliflozin] Rash   Grass Pollen(k-o-r-t-swt Vern)    Lisinopril Cough        Medication List        Accurate as of January 20, 2021 12:54 PM. If you have any questions, ask your nurse or doctor.          STOP taking these medications    omeprazole 20 MG  capsule Commonly known as: PRILOSEC Stopped by: Sigurd Sos, MD   Pfizer-BioNT COVID-19 Vac-TriS Susp injection Generic drug: COVID-19 mRNA Vac-TriS Therapist, music) Stopped by: Sigurd Sos, MD       TAKE these medications    albuterol 108 (90 Base) MCG/ACT inhaler Commonly known as: VENTOLIN HFA Inhale 2 puffs by mouth every 4-6 hours as needed for coughing or wheezing spells What changed: additional instructions Changed by: Sigurd Sos, MD   aspirin EC 81 MG tablet Take 81 mg by mouth daily.   azelastine 0.1 % nasal spray Commonly known as: ASTELIN Place 2 sprays into both nostrils 2 (two) times daily as needed for rhinitis (nasal congestion). Use in each nostril as directed Started by: Sigurd Sos, MD   clopidogrel 75 MG tablet Commonly known as: PLAVIX TAKE 1 TABLET BY MOUTH DAILY WITH BREAKFAST   Coenzyme Q10 200 MG capsule Take 200 mg by mouth daily.   Diclofenac Sodium 1.5 % Soln APPLY 2 GRAMS ON TO THE AFFECTED AREA 2 TIMES DAILY   diclofenac Sodium 1 % Gel Commonly known as: VOLTAREN Apply 2 g topically 4 (four) times daily.   ezetimibe 10 MG tablet Commonly known as: ZETIA TAKE 1 TABLET BY MOUTH ONCE DAILY  Farxiga 10 MG Tabs tablet Generic drug: dapagliflozin propanediol TAKE 1 TABLET BY MOUTH ONCE DAILY   fluticasone 110 MCG/ACT inhaler Commonly known as: FLOVENT HFA Inhale 2 puffs into the lungs 2 (two) times daily. Use with spacer. Rinse, gargle, and spit after use.   FreeStyle Freedom Lite w/Device Kit Use to check blood sugar daily.   freestyle lancets Use daily to check blood sugar   FREESTYLE LITE test strip Generic drug: glucose blood Use daily to check blood sugar.   glimepiride 2 MG tablet Commonly known as: AMARYL Take 1 tablet (2 mg total) by mouth daily before breakfast.   Januvia 100 MG tablet Generic drug: sitaGLIPtin Take 1 tablet by mouth daily.   lidocaine 5 % Commonly known as: Lidoderm Place 1 patch onto the skin daily.  Remove & Discard patch within 12 hours or as directed by MD   losartan 25 MG tablet Commonly known as: COZAAR Take 1 tablet (25 mg total) by mouth daily.   metFORMIN 1000 MG tablet Commonly known as: GLUCOPHAGE TAKE 1 TABLET BY MOUTH 2 TIMES DAILY WITH A MEAL   methylphenidate 20 MG tablet Commonly known as: Ritalin Take 1 tablet (20 mg total) by mouth 2 (two) times daily. Start taking on: February 15, 2021 What changed: Another medication with the same name was removed. Continue taking this medication, and follow the directions you see here. Changed by: Sigurd Sos, MD   montelukast 10 MG tablet Commonly known as: SINGULAIR TAKE 1 TABLET (10 MG TOTAL) BY MOUTH DAILY.   nitroGLYCERIN 0.4 MG SL tablet Commonly known as: Nitrostat Place 1 tablet (0.4 mg total) under the tongue every 5 (five) minutes as needed.   pantoprazole 40 MG tablet Commonly known as: PROTONIX Take 1 tablet (40 mg total) by mouth daily. Started by: Sigurd Sos, MD   pioglitazone 45 MG tablet Commonly known as: Actos Take 1 tablet (45 mg total) by mouth daily.   pravastatin 20 MG tablet Commonly known as: PRAVACHOL TAKE 1 TABLET (20 MG TOTAL) BY MOUTH DAILY.   pregabalin 75 MG capsule Commonly known as: LYRICA TAKE 1 CAPSULE (75 MG TOTAL) BY MOUTH 2 (TWO) TIMES DAILY.   tadalafil 20 MG tablet Commonly known as: CIALIS Take 0.5-1 tablets (10-20 mg total) by mouth every other day as needed for erectile dysfunction.   testosterone 50 MG/5GM (1%) Gel Commonly known as: ANDROGEL APPLY 2 APPLICATIONS ON TO THE SKIN DAILY       Past Medical History:  Diagnosis Date   ADD (attention deficit disorder)    Anemia    Arthritis    CAD (coronary artery disease)    2/19 PCI/DESx1 to Lcx   Diabetes mellitus without complication (HCC)    Exercise-induced asthma    Heart murmur    Hyperlipidemia    family hx of high cholesterol   Hypogonadism in male    Low back pain    Osteoarthritis    Past  Surgical History:  Procedure Laterality Date   CORONARY STENT INTERVENTION N/A 06/23/2017   Procedure: CORONARY STENT INTERVENTION;  Surgeon: Leonie Man, MD;  Location: [facility];  Service: Cardiovascular;  Laterality: N/A;   INTRAVASCULAR PRESSURE WIRE/FFR STUDY N/A 06/23/2017   Procedure: INTRAVASCULAR PRESSURE WIRE/FFR STUDY;  Surgeon: Leonie Man, MD;  Location: [facility];  Service: Cardiovascular;  Laterality: N/A;   KNEE ARTHROSCOPY     LEFT HEART CATH AND CORONARY ANGIOGRAPHY N/A 06/23/2017   Procedure: LEFT HEART CATH AND  CORONARY ANGIOGRAPHY;  Surgeon: Leonie Man, MD;  Location: [facility];  Service: Cardiovascular;  Laterality: N/A;   SHOULDER ARTHROSCOPY WITH BICEPSTENOTOMY Right 09/06/2019   Procedure: SHOULDER ARTHROSCOPY WITH BICEPSTENOTOMY;  Surgeon: Leandrew Koyanagi, MD;  Location: [facility];  Service: Orthopedics;  Laterality: Right;   SHOULDER ARTHROSCOPY WITH SUBACROMIAL DECOMPRESSION Right 09/06/2019   Procedure: RIGHT SHOULDER ARTHROSCOPY WITH EXTENSIVE DEBRIDEMENT, SUBACROMIAL DECOMPRESSION, BICEPS TENOTOMY;  Surgeon: Leandrew Koyanagi, MD;  Location: [facility];  Service: Orthopedics;  Laterality: Right;   TENOTOMY ACHILLES TENDON     Otherwise, there have been no changes to his past medical history, surgical history, family history, or social history.  ROS: All others negative except as noted per HPI.   Objective:  BP 104/62   Pulse 74   Temp (!) 97.4 F (36.3 C) (Temporal)   Resp 16   Ht '5\' 10"'  (1.778 m)   Wt 206 lb (93.4 kg)   SpO2 99%   BMI 29.56 kg/m  Body mass index is 29.56 kg/m. Physical Exam: General Appearance:  Alert, cooperative, no distress, appears stated age  Head:  Normocephalic, without obvious abnormality, atraumatic  Eyes:  Conjunctiva clear, EOM's intact  Nose: Nares normal, normal turbinates, no edema, mucosa pink  Throat: Lips, tongue normal; teeth and gums normal  Neck:  Supple, symmetrical  Lungs:   Clear to auscultation bilaterally, no wheezing, respirations unlabored, no coughing  Heart:  Appears well perfused  Extremities: No edema  Skin: Skin color, texture, turgor normal, no rashes or lesions on visualized portions of skin  Neurologic: No gross deficits    Spirometry:  Tracings reviewed. His effort: Good reproducible efforts. FVC: 3.44 L (pre), 3.45 L  (post) FEV1: 2.98 L, 88% predicted (pre), 2.98 L, 88% predicted (post) FEV1/FVC ratio: 118% (pre), 116% (post) Interpretation: Spirometry consistent with possible restrictive disease without significant bronchodilator response Please see scanned spirometry results for details.  Assessment:  Etiology of chronic cough is broad. Based on history suspect an upper airway component with irritant rhinitis +/- allergic rhinitis and reflux being the main two contributors.  He does have a history of exercise-induced asthma and reports improvement with albuterol despite normal spirometry today.  He has had significant bronchodilator response in the past.  We will hold on ICS therapy at this time.  Plan/Recommendations:   Patient Instructions  Chronic Cough:  - Etiology of chronic cough is broad. Common considerations include asthma, allergic rhinitis, nonallergic rhinitis, acid reflux, neurogenic and/or habitual cough among others.  The history and physical examination suggest this cough is multifactorial and potentially attributed to GERD, and perfumes/irritants with possible uncontrolled asthma.  We will address perfumes/irritants and reflux at this time. We will continue with current asthma medications.  Todays spirometry results, assessed while asymptomatic, do not show bronchodilator response. Continue albuterol HFA, 1 to 2 inhalations every 4-6 hours if needed. Subjective and objective measures of pulmonary function will be followed and the treatment plan will be adjusted accordingly. May consider ICS  at follow-up as well as CXR and full PFTs   Allergic rhinitis with a nonallergic (irritant) component At your previous visit, Perennial and seasonal epicutaneous tests were negative despite a positive histamine control.  Intradermal tests were mildly reactive to grass pollen, major mold mix #4, and cockroach antigen. Continue allergen avoidance to the above as well as to irritants (perfumes, strong smells, chemical cleaners, etc) A prescription has been provided for azelastine nasal spray, 1 spray per nostril  twice daily as needed for congestion.  Start over the counter Nasacort (triamcinolone) in place of Flonase as it does not have alcohol base or floral scent (can also get Flonase sensimist) to be used 2 sprays once daily.  Must be used daily for full effect. Stay away from [place] as it can cause rebound nasal congestion (if must use, do not use more than 3 days in a row) Nasal saline spray (i.e., Simply Saline, Ocean Mist) or nasal saline lavage (i.e., NeilMed-with distilled water only) is recommended as needed and prior to medicated nasal sprays.   Acid reflux The history suggests that there may be silent reflux contributing to the cough. Appropriate reflux lifestyle modifications have been provided. For now, start Pantoprazole (protonix) 40 mg nightly. Can not take omeprazole due to contraindication while on Plavix. May take Tums as needed. If you are not controlled with this, may consider referral to gastroenterology.  Follow-up in 6 to 8 weeks.  Sigurd Sos, MD  [facility]

## 2021-01-17 NOTE — Patient Instructions (Addendum)
Give [place] 2-3 business days to get the results of your labs back.   Keep the diet clean and stay active.  I recommend getting the flu shot in mid October. This suggestion would change if the CDC comes out with a different recommendation.   Do not shower for the rest of the day. When you do wash it, use only soap and water. Do not vigorously scrub. Apply triple antibiotic ointment (like Neosporin) twice daily. Keep the area clean and dry.   Things to look out for: increasing pain not relieved by ibuprofen/acetaminophen, fevers, spreading redness, drainage of pus, or foul odor.  Give [place] 1 week to get the results of your biopsy back.  Let us know if you need anything.

## 2021-01-17 NOTE — Addendum Note (Signed)
Addended by: Sharon Seller B on: 01/17/2021 02:53 PM   Modules accepted: Orders

## 2021-01-17 NOTE — Progress Notes (Addendum)
Subjective:   Chief Complaint  Patient presents with   Follow-up    diabetes    Reginald Rios is a 65 y.o. male here for follow-up of diabetes.   [name]'s self monitored glucose range is mid 100's.  Patient denies hypoglycemic reactions. He checks his glucose levels 2 time(s) per week. Patient does not require insulin.   Medications include: Actos 45 mg daily, metformin 1000 mg twice daily, Januvia 100 mg daily, Farxiga 10 mg daily. Diet is getting better.  Exercise: pickle ball, walking No Cp or SOB.   Skin lesion The last several weeks, the patient has noticed a sore on his right ear.  It was scale and scab.  He will pick at it and will bleed.  It then recurs.  He has history of sun exposure for many years over this area.  Past Medical History:  Diagnosis Date   ADD (attention deficit disorder)    Anemia    Arthritis    CAD (coronary artery disease)    2/19 PCI/DESx1 to Lcx   Diabetes mellitus without complication (HCC)    Exercise-induced asthma    Heart murmur    Hyperlipidemia    family hx of high cholesterol   Hypogonadism in male    Low back pain    Osteoarthritis      Related testing: Retinal exam: Done Pneumovax: done  Objective:  BP 108/76   Pulse (!) 59   Temp 98.4 F (36.9 C) (Oral)   Ht '5\' 10"'$  (1.778 m)   Wt 203 lb 6 oz (92.3 kg)   SpO2 98%   BMI 29.18 kg/m  General:  Well developed, well nourished, in no apparent distress Skin:  See below. 0.4 cm.  Head:  Normocephalic, atraumatic Eyes:  Pupils equal and round, sclera anicteric without injection  Lungs:  CTAB, no access msc use Cardio:  RRR, no bruits, no LE edema Psych: Age appropriate judgment and insight   R ear  Procedure note; shave biopsy Informed consent was obtained. The area was cleaned with alcohol and injected with 0.3 mL of 2% lidocaine without epinephrine. A Dermablade was slightly bent and used to cut under the area of interest. The specimen was placed in a sterile  specimen cup and sent to the lab. The area was then cauterized ensuring adequate hemostasis. The area was dressed with triple antibiotic ointment and a bandage. There were no complications noted. The patient tolerated the procedure well.  Assessment:   Type 2 diabetes mellitus with hyperglycemia, without long-term current use of insulin ([place]) - Plan: Hemoglobin A1c  Neoplasm of uncertain behavior   Plan:   Chronic, uncontrolled. Cont Farxiga 10 mg daily, metformin 1000 mg twice daily, Actos 45 mg daily, Januvia 100 mg daily.  Check A1c today.  Flu shot recommended counseled on diet and exercise. New problem, uncertain prognosis, concern for SCC.  Shave biopsy today.  Aftercare instructions verbalized and written down. F/u in 3-6 mo pending results. The patient voiced understanding and agreement to the plan.  [place], DO 01/17/21 2:36 PM

## 2021-01-18 ENCOUNTER — Other Ambulatory Visit: Payer: Self-pay | Admitting: Family Medicine

## 2021-01-18 LAB — HEMOGLOBIN A1C
Hgb A1c MFr Bld: 8.1 % of total Hgb — ABNORMAL HIGH (ref <5.7)
Mean Plasma Glucose: 186 mg/dL
eAG (mmol/L): 10.3 mmol/L

## 2021-01-18 MED ORDER — GLIMEPIRIDE 2 MG PO TABS
2.0000 mg | ORAL_TABLET | Freq: Every day | ORAL | 3 refills | Status: DC
  Filled 2021-01-18: qty 30, 30d supply, fill #0
  Filled 2021-03-10: qty 30, 30d supply, fill #1

## 2021-01-20 ENCOUNTER — Encounter: Payer: Self-pay | Admitting: Internal Medicine

## 2021-01-20 ENCOUNTER — Other Ambulatory Visit (HOSPITAL_BASED_OUTPATIENT_CLINIC_OR_DEPARTMENT_OTHER): Payer: Self-pay

## 2021-01-20 ENCOUNTER — Ambulatory Visit (INDEPENDENT_AMBULATORY_CARE_PROVIDER_SITE_OTHER): Payer: 59 | Admitting: Internal Medicine

## 2021-01-20 ENCOUNTER — Other Ambulatory Visit: Payer: Self-pay | Admitting: Family Medicine

## 2021-01-20 ENCOUNTER — Other Ambulatory Visit: Payer: Self-pay

## 2021-01-20 VITALS — BP 104/62 | HR 74 | Temp 97.4°F | Resp 16 | Ht 70.0 in | Wt 206.0 lb

## 2021-01-20 DIAGNOSIS — R053 Chronic cough: Secondary | ICD-10-CM

## 2021-01-20 DIAGNOSIS — K219 Gastro-esophageal reflux disease without esophagitis: Secondary | ICD-10-CM | POA: Diagnosis not present

## 2021-01-20 DIAGNOSIS — J45990 Exercise induced bronchospasm: Secondary | ICD-10-CM | POA: Diagnosis not present

## 2021-01-20 DIAGNOSIS — J3089 Other allergic rhinitis: Secondary | ICD-10-CM | POA: Diagnosis not present

## 2021-01-20 DIAGNOSIS — E291 Testicular hypofunction: Secondary | ICD-10-CM

## 2021-01-20 MED ORDER — ALBUTEROL SULFATE HFA 108 (90 BASE) MCG/ACT IN AERS
INHALATION_SPRAY | RESPIRATORY_TRACT | 3 refills | Status: DC
  Filled 2021-01-20: qty 18, 17d supply, fill #0
  Filled 2021-06-24: qty 18, 17d supply, fill #1
  Filled 2021-10-21: qty 18, 17d supply, fill #0
  Filled 2021-10-21: qty 18, 17d supply, fill #1

## 2021-01-20 MED ORDER — PANTOPRAZOLE SODIUM 40 MG PO TBEC
40.0000 mg | DELAYED_RELEASE_TABLET | Freq: Every day | ORAL | 3 refills | Status: DC
  Filled 2021-01-20: qty 90, 90d supply, fill #0

## 2021-01-20 MED ORDER — AZELASTINE HCL 0.1 % NA SOLN
2.0000 | Freq: Two times a day (BID) | NASAL | 3 refills | Status: DC | PRN
  Filled 2021-01-20: qty 30, 25d supply, fill #0
  Filled 2021-04-26: qty 30, 25d supply, fill #1
  Filled 2021-06-24: qty 30, 25d supply, fill #2
  Filled 2021-08-19: qty 30, 25d supply, fill #3

## 2021-01-20 MED ORDER — TESTOSTERONE 50 MG/5GM (1%) TD GEL
TRANSDERMAL | 5 refills | Status: DC
Start: 2021-01-20 — End: 2021-08-19
  Filled 2021-01-20: qty 300, 30d supply, fill #0
  Filled 2021-03-10: qty 300, 30d supply, fill #1
  Filled 2021-04-30: qty 300, 30d supply, fill #2
  Filled 2021-07-08: qty 300, 30d supply, fill #3

## 2021-01-20 NOTE — Patient Instructions (Addendum)
Chronic Cough:  - Etiology of chronic cough is broad. Common considerations include asthma, allergic rhinitis, nonallergic rhinitis, acid reflux, neurogenic and/or habitual cough among others.  The history and physical examination suggest this cough is multifactorial and potentially attributed to GERD, and perfumes/irritants with possible uncontrolled asthma.  We will address perfumes/irritants and reflux at this time. We will continue with current asthma medications.  Todays spirometry results, assessed while asymptomatic, do not show bronchodilator response. Continue albuterol HFA, 1 to 2 inhalations every 4-6 hours if needed. Subjective and objective measures of pulmonary function will be followed and the treatment plan will be adjusted accordingly.   Allergic rhinitis with a nonallergic (irritant) component At your previous visit, Perennial and seasonal epicutaneous tests were negative despite a positive histamine control.  Intradermal tests were mildly reactive to grass pollen, major mold mix #4, and cockroach antigen. Continue allergen avoidance to the above as well as to irritants (perfumes, strong smells, chemical cleaners, etc) A prescription has been provided for azelastine nasal spray, 1 spray per nostril twice daily as needed for congestion.  Start over the counter Nasacort (triamcinolone) in place of Flonase as it does not have alcohol base or floral scent (can also get Flonase sensimist) to be used 2 sprays once daily.  Must be used daily for full effect. Stay away from [place] as it can cause rebound nasal congestion (if must use, do not use more than 3 days in a row) Nasal saline spray (i.e., Simply Saline, Ocean Mist) or nasal saline lavage (i.e., NeilMed-with distilled water only) is recommended as needed and prior to medicated nasal sprays.   Acid reflux The history suggests that there may be silent reflux contributing to the cough. Appropriate reflux lifestyle modifications have  been provided. For now, start Pantoprazole (protonix) 40 mg nightly.  May take Tums as needed. If you are not controlled with this, may consider referral to gastroenterology.  Follow-up in 6 to 8 weeks.  ---------------------------------------------------------------------------  Lifestyle Changes for Controlling GERD  When you have GERD, stomach acid feels as if it's backing up toward your mouth. Whether or not you take medication to control your GERD, your symptoms can often be improved with lifestyle changes.   Raise Your Head Reflux is more likely to strike when you're lying down flat, because stomach fluid can flow backward more easily. Raising the head of your bed 4-6 inches can help. To do this: Slide blocks or books under the legs at the head of your bed. Or, place a wedge under the mattress. Many foam stores can make a suitable wedge for you. The wedge should run from your waist to the top of your head. Don't just prop your head on several pillows. This increases pressure on your stomach. It can make GERD worse.  Watch Your Eating Habits Certain foods may increase the acid in your stomach or relax the lower esophageal sphincter, making GERD more likely. It's best to avoid the following: Coffee, tea, and carbonated drinks (with and without caffeine) Fatty, fried, or spicy food Mint, chocolate, onions, and tomatoes Any other foods that seem to irritate your stomach or cause you pain  Relieve the Pressure Eat smaller meals, even if you have to eat more often. Don't lie down right after you eat. Wait a few hours for your stomach to empty. Avoid tight belts and tight-fitting clothes. Lose excess weight.  Tobacco and Alcohol Avoid smoking tobacco and drinking alcohol. They can make GERD symptoms worse.  Reducing Pollen Exposure  The  American Academy of Allergy, Asthma and Immunology suggests the following steps to reduce your exposure to pollen during allergy seasons.     Do not hang sheets or clothing out to dry; pollen may collect on these items. Do not mow lawns or spend time around freshly cut grass; mowing stirs up pollen. Keep windows closed at night.  Keep car windows closed while driving. Minimize morning activities outdoors, a time when pollen counts are usually at their highest. Stay indoors as much as possible when pollen counts or humidity is high and on windy days when pollen tends to remain in the air longer. Use air conditioning when possible.  Many air conditioners have filters that trap the pollen spores. Use a HEPA room air filter to remove pollen form the indoor air you breathe.   Control of Mold Allergen  Mold and fungi can grow on a variety of surfaces provided certain temperature and moisture conditions exist.  Outdoor molds grow on plants, decaying vegetation and soil.  The major outdoor mold, Alternaria and Cladosporium, are found in very high numbers during hot and dry conditions.  Generally, a late Summer - Fall peak is seen for common outdoor fungal spores.  Rain will temporarily lower outdoor mold spore count, but counts rise rapidly when the rainy period ends.  The most important indoor molds are Aspergillus and Penicillium.  Dark, humid and poorly ventilated basements are ideal sites for mold growth.  The next most common sites of mold growth are the bathroom and the kitchen.  Outdoor [employer] Use air conditioning and keep windows closed Avoid exposure to decaying vegetation. Avoid leaf raking. Avoid grain handling. Consider wearing a face mask if working in moldy areas.  Indoor Mold Control Maintain humidity below 50%. Clean washable surfaces with 5% bleach solution. Remove sources e.g. Contaminated carpets.  Control of Cockroach Allergen  Cockroach allergen has been identified as an important cause of acute attacks of asthma, especially in urban settings.  There are fifty-five species of cockroach that exist in the  [place], however only three, the [place], [place] species produce allergen that can affect patients with Asthma.  Allergens can be obtained from fecal particles, egg casings and secretions from cockroaches.    Remove food sources. Reduce access to water. Seal access and entry points. Spray runways with 0.5-1% Diazinon or Chlorpyrifos Blow boric acid power under stoves and refrigerator. Place bait stations (hydramethylnon) at feeding sites.

## 2021-01-23 ENCOUNTER — Ambulatory Visit: Payer: 59 | Attending: Internal Medicine

## 2021-01-23 DIAGNOSIS — Z23 Encounter for immunization: Secondary | ICD-10-CM

## 2021-01-23 NOTE — Progress Notes (Signed)
   Covid-19 Vaccination Clinic  Name:  Reginald Rios    MRN: [id number] DOB: Sep 03, 1955  01/23/2021  [name] was observed post Covid-19 immunization for 15 minutes without incident. He was provided with Vaccine Information Sheet and instruction to access the V-Safe system.   [name] was instructed to call 911 with any severe reactions post vaccine: Difficulty breathing  Swelling of face and throat  A fast heartbeat  A bad rash all over body  Dizziness and weakness

## 2021-01-27 DIAGNOSIS — M542 Cervicalgia: Secondary | ICD-10-CM | POA: Diagnosis not present

## 2021-01-29 ENCOUNTER — Other Ambulatory Visit (HOSPITAL_BASED_OUTPATIENT_CLINIC_OR_DEPARTMENT_OTHER): Payer: Self-pay

## 2021-01-29 MED ORDER — COVID-19MRNA BIVAL VACC PFIZER 30 MCG/0.3ML IM SUSP
INTRAMUSCULAR | 0 refills | Status: DC
  Filled 2021-01-29: qty 0.3, 1d supply, fill #0

## 2021-02-03 ENCOUNTER — Other Ambulatory Visit (HOSPITAL_BASED_OUTPATIENT_CLINIC_OR_DEPARTMENT_OTHER): Payer: Self-pay

## 2021-02-05 DIAGNOSIS — M545 Low back pain, unspecified: Secondary | ICD-10-CM | POA: Insufficient documentation

## 2021-02-05 HISTORY — DX: Low back pain, unspecified: M54.50

## 2021-02-11 ENCOUNTER — Other Ambulatory Visit (HOSPITAL_BASED_OUTPATIENT_CLINIC_OR_DEPARTMENT_OTHER): Payer: Self-pay

## 2021-02-11 ENCOUNTER — Other Ambulatory Visit: Payer: Self-pay | Admitting: Family Medicine

## 2021-02-13 ENCOUNTER — Other Ambulatory Visit (HOSPITAL_BASED_OUTPATIENT_CLINIC_OR_DEPARTMENT_OTHER): Payer: Self-pay

## 2021-02-17 ENCOUNTER — Other Ambulatory Visit (HOSPITAL_BASED_OUTPATIENT_CLINIC_OR_DEPARTMENT_OTHER): Payer: Self-pay

## 2021-02-17 MED ORDER — INFLUENZA VAC A&B SA ADJ QUAD 0.5 ML IM PRSY
PREFILLED_SYRINGE | INTRAMUSCULAR | 0 refills | Status: DC
  Filled 2021-02-17: qty 0.5, 1d supply, fill #0

## 2021-02-18 NOTE — Progress Notes (Signed)
I, [name], LAT, ATC, am serving as scribe for Dr. Lynne Leader.  Reginald Rios is a 65 y.o. male who presents to [place] at [facility] today for low back pain.  He was last seen by Dr. Shelly Bombard on 04/10/20 for neck pain/cervical radiculopathy.  Today, he reports low back pain x 2 weeks after painting a door.  He then experienced sharp pain about a week later when he got up out of a chair.  He locates his pain to the L side of his lower back and reports electrical-type sensations radiating into his B post thighs to his knees.  Radiating pain: yes into his B post legs to his knees LE numbness/tingling: yes in B LEs Aggravating factors: rotational activities; reaching overhead; transitioning from supine to sit and sit to stand Treatments tried: ice, heat, massage, chiropractor; baby Aspirin   Pertinent review of systems: No fevers or chills  Relevant historical information: Diabetes.  History of back pain.   Exam:  BP 130/66 (BP Location: Right Arm, Patient Position: Sitting, Cuff Size: Normal)   Pulse 78   Ht 5\' 10"  (1.778 m)   Wt 207 lb 6.4 oz (94.1 kg)   SpO2 98%   BMI 29.76 kg/m  General: Well Developed, well nourished, and in no acute distress.   MSK: L-spine normal-appearing Nontender midline. Range of motion limited extension. Lower extremity strength is intact. Negative slump test.     Lab and Radiology Results  X-ray images L-spine obtained today personally and independently interpreted.  X-ray images compared to lumbar spine MRI from 2018. Multilevel DDD and spondylosis.  No acute fractures. Await formal radiology review   EXAM: MRI LUMBAR SPINE WITHOUT CONTRAST   TECHNIQUE: Multiplanar, multisequence MR imaging of the lumbar spine was performed. No intravenous contrast was administered.   COMPARISON:  Thoracic spine radiograph July 09, 2016 and MRI of the lumbar spine October 23, 2013   FINDINGS: SEGMENTATION: For the purposes of  this report, the last well-formed intervertebral disc will be described as L5-S1.   ALIGNMENT: Maintenance of the lumbar lordosis. Similar grade 1 L5-S1 anterolisthesis and minimal grade 1 L2-3 retrolisthesis. No definite pars interarticularis defects.   VERTEBRAE:Vertebral bodies are intact. Mild disc height loss all lumbar levels with disc desiccation compatible with degenerative discs. Mild to moderate subacute on chronic discogenic endplate changes [name] through L5-S1. No suspicious or acute bone marrow signal.   CONUS MEDULLARIS: Conus medullaris terminates at T12-L1 and demonstrates normal morphology and signal characteristics. Cauda equina is normal.   PARASPINAL AND SOFT TISSUES: Included prevertebral and paraspinal soft tissues are nonacute. Bilateral extrarenal pelvises.   DISC LEVELS:   L1-2: Small broad-based disc bulge asymmetric to the RIGHT. Mild facet arthropathy and ligamentum flavum redundancy without canal stenosis or neural foraminal narrowing.   L2-3: Retrolisthesis. Small broad-based disc bulge. Mild facet arthropathy and ligamentum flavum redundancy without canal stenosis or neural foraminal narrowing.   L3-4: 4 mm broad-based disc bulge with tiny central disc extrusion, 2 mm contiguous inferior migration. Mild facet arthropathy and ligamentum flavum redundancy. Minimal canal stenosis. No neural foraminal narrowing.   L4-5: 4 mm broad-based disc bulge. Moderate facet arthropathy and ligamentum flavum redundancy. Moderate canal stenosis with lateral recess narrowing which could affect the traversing L5 nerves. Mild RIGHT, moderate LEFT neural foraminal narrowing.   L5-S1: Anterolisthesis. 3 mm broad-based disc bulge. Severe facet arthropathy and ligamentum flavum redundancy. Moderate canal stenosis. Lateral recess narrowing may affect the traversing S1 nerves. Moderate  to severe RIGHT, moderate LEFT neural foraminal narrowing.   IMPRESSION: Similar  grade 1 L5-S1 anterolisthesis, no definite pars interarticularis defects. Stable minimal grade 1 L2-3 retrolisthesis.   Moderate canal stenosis L4-5 and L5-S1.   Neural foraminal narrowing L4-5 and L5-S1: Moderate to severe on the RIGHT at L5-S1.     Electronically Signed   By: Elon Alas M.D.   On: 09/19/2016 20:27 I, Lynne Leader, personally (independently) visualized and performed the interpretation of the images attached in this note.   Assessment and Plan: 65 y.o. male with acute exacerbation of chronic low back pain with some bilateral lumbar sacral radiculopathy.  Predominant pain today is muscle spasm and dysfunction but he does have some radicular pain as well.  Primary treatment will be physical therapy.  However we will also use baclofen as he has had some benefit with this in the past.  Additionally short course of prednisone.  He does not tolerate gabapentin or Lyrica also will avoid these medications.  Follow-up in 6 weeks or sooner if needed.  Obtain x-ray today lumbar spine.   PDMP not reviewed this encounter. Orders Placed This Encounter  Procedures   DG Lumbar Spine 2-3 Views    Standing Status:   Future    Number of Occurrences:   1    Standing Expiration Date:   03/22/2021    Order Specific Question:   Reason for Exam (SYMPTOM  OR DIAGNOSIS REQUIRED)    Answer:   Low back pain    Order Specific Question:   Preferred imaging location?    Answer:   [facility]   Ambulatory referral to Physical Therapy    Referral Priority:   Routine    Referral Type:   Physical Medicine    Referral Reason:   Specialty Services Required    Requested Specialty:   Physical Therapy    Number of Visits Requested:   1   Meds ordered this encounter  Medications   baclofen (LIORESAL) 10 MG tablet    Sig: Take 1 tablet (10 mg total) by mouth 3 (three) times daily as needed for muscle spasms.    Dispense:  30 each    Refill:  1   predniSONE (DELTASONE) 50 MG tablet     Sig: Take 1 tablet (50 mg total) by mouth daily with breakfast for 5 days.    Dispense:  5 tablet    Refill:  0     Discussed warning signs or symptoms. Please see discharge instructions. Patient expresses understanding.   The above documentation has been reviewed and is accurate and complete Lynne Leader, M.D.

## 2021-02-19 ENCOUNTER — Encounter: Payer: Self-pay | Admitting: Family Medicine

## 2021-02-19 ENCOUNTER — Ambulatory Visit (INDEPENDENT_AMBULATORY_CARE_PROVIDER_SITE_OTHER): Payer: 59

## 2021-02-19 ENCOUNTER — Other Ambulatory Visit (HOSPITAL_BASED_OUTPATIENT_CLINIC_OR_DEPARTMENT_OTHER): Payer: Self-pay

## 2021-02-19 ENCOUNTER — Other Ambulatory Visit: Payer: Self-pay

## 2021-02-19 ENCOUNTER — Ambulatory Visit: Payer: 59 | Admitting: Family Medicine

## 2021-02-19 VITALS — BP 130/66 | HR 78 | Ht 70.0 in | Wt 207.4 lb

## 2021-02-19 DIAGNOSIS — M5441 Lumbago with sciatica, right side: Secondary | ICD-10-CM

## 2021-02-19 DIAGNOSIS — M5137 Other intervertebral disc degeneration, lumbosacral region: Secondary | ICD-10-CM | POA: Diagnosis not present

## 2021-02-19 DIAGNOSIS — M5442 Lumbago with sciatica, left side: Secondary | ICD-10-CM

## 2021-02-19 DIAGNOSIS — M545 Low back pain, unspecified: Secondary | ICD-10-CM | POA: Diagnosis not present

## 2021-02-19 MED ORDER — BACLOFEN 10 MG PO TABS
10.0000 mg | ORAL_TABLET | Freq: Three times a day (TID) | ORAL | 1 refills | Status: DC | PRN
Start: 2021-02-19 — End: 2022-08-29
  Filled 2021-02-19: qty 30, 10d supply, fill #0

## 2021-02-19 MED ORDER — PREDNISONE 50 MG PO TABS
50.0000 mg | ORAL_TABLET | Freq: Every day | ORAL | 0 refills | Status: AC
  Filled 2021-02-19: qty 5, 5d supply, fill #0

## 2021-02-19 NOTE — Patient Instructions (Addendum)
Nice to see you today.  Please get an Xray today before you leave.  Prednisone and baclofen have been prescribed to your pharmacy.  I've referred you to Physical Therapy in [place].  Please let us know if you don't hear from them in one week regarding scheduling.  Follow-up in 6 weeks.

## 2021-02-20 NOTE — Progress Notes (Signed)
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

## 2021-02-21 ENCOUNTER — Other Ambulatory Visit: Payer: Self-pay | Admitting: Family Medicine

## 2021-02-21 ENCOUNTER — Other Ambulatory Visit (HOSPITAL_BASED_OUTPATIENT_CLINIC_OR_DEPARTMENT_OTHER): Payer: Self-pay

## 2021-02-21 MED ORDER — PRAVASTATIN SODIUM 20 MG PO TABS
ORAL_TABLET | Freq: Every day | ORAL | 2 refills | Status: DC
  Filled 2021-02-21: qty 90, 90d supply, fill #0
  Filled 2021-06-01: qty 90, 90d supply, fill #1

## 2021-02-24 ENCOUNTER — Other Ambulatory Visit (HOSPITAL_BASED_OUTPATIENT_CLINIC_OR_DEPARTMENT_OTHER): Payer: Self-pay

## 2021-02-26 ENCOUNTER — Other Ambulatory Visit: Payer: Self-pay

## 2021-02-26 ENCOUNTER — Encounter: Payer: Self-pay | Admitting: Rehabilitative and Restorative Service Providers"

## 2021-02-26 ENCOUNTER — Ambulatory Visit (INDEPENDENT_AMBULATORY_CARE_PROVIDER_SITE_OTHER): Payer: 59 | Admitting: Rehabilitative and Restorative Service Providers"

## 2021-02-26 DIAGNOSIS — M25652 Stiffness of left hip, not elsewhere classified: Secondary | ICD-10-CM | POA: Diagnosis not present

## 2021-02-26 DIAGNOSIS — M25651 Stiffness of right hip, not elsewhere classified: Secondary | ICD-10-CM

## 2021-02-26 DIAGNOSIS — R29898 Other symptoms and signs involving the musculoskeletal system: Secondary | ICD-10-CM

## 2021-02-26 DIAGNOSIS — R531 Weakness: Secondary | ICD-10-CM

## 2021-02-26 DIAGNOSIS — M5441 Lumbago with sciatica, right side: Secondary | ICD-10-CM

## 2021-02-26 DIAGNOSIS — M5442 Lumbago with sciatica, left side: Secondary | ICD-10-CM | POA: Diagnosis not present

## 2021-02-26 NOTE — Therapy (Signed)
[place], [place], [address] Phone: [phone]   Fax:  [phone]  Physical Therapy Evaluation  Patient Details  Name: Reginald Rios MRN: [id number] Date of Birth: Mar 14, 1956 Referring Provider (PT): Dr Lynne Leader   Encounter Date: 02/26/2021   PT End of Session - 02/26/21 1018     Visit Number 1    Number of Visits 12    Date for PT Re-Evaluation 04/09/21    PT Start Time 5462    PT Stop Time 1100    PT Time Calculation (min) 45 min             Past Medical History:  Diagnosis Date   ADD (attention deficit disorder)    Anemia    Arthritis    CAD (coronary artery disease)    2/19 PCI/DESx1 to Lcx   Diabetes mellitus without complication ([place])    Exercise-induced asthma    Heart murmur    Hyperlipidemia    family hx of high cholesterol   Hypogonadism in male    Low back pain    Osteoarthritis     Past Surgical History:  Procedure Laterality Date   CORONARY STENT INTERVENTION N/A 06/23/2017   Procedure: CORONARY STENT INTERVENTION;  Surgeon: Leonie Man, MD;  Location: [facility];  Service: Cardiovascular;  Laterality: N/A;   INTRAVASCULAR PRESSURE WIRE/FFR STUDY N/A 06/23/2017   Procedure: INTRAVASCULAR PRESSURE WIRE/FFR STUDY;  Surgeon: Leonie Man, MD;  Location: [facility];  Service: Cardiovascular;  Laterality: N/A;   KNEE ARTHROSCOPY     LEFT HEART CATH AND CORONARY ANGIOGRAPHY N/A 06/23/2017   Procedure: LEFT HEART CATH AND CORONARY ANGIOGRAPHY;  Surgeon: Leonie Man, MD;  Location: [facility];  Service: Cardiovascular;  Laterality: N/A;   SHOULDER ARTHROSCOPY WITH BICEPSTENOTOMY Right 09/06/2019   Procedure: SHOULDER ARTHROSCOPY WITH BICEPSTENOTOMY;  Surgeon: Leandrew Koyanagi, MD;  Location: [facility];  Service: Orthopedics;  Laterality: Right;   SHOULDER ARTHROSCOPY WITH SUBACROMIAL DECOMPRESSION Right 09/06/2019    Procedure: RIGHT SHOULDER ARTHROSCOPY WITH EXTENSIVE DEBRIDEMENT, SUBACROMIAL DECOMPRESSION, BICEPS TENOTOMY;  Surgeon: Leandrew Koyanagi, MD;  Location: [facility];  Service: Orthopedics;  Laterality: Right;   TENOTOMY ACHILLES TENDON      There were no vitals filed for this visit.    Subjective Assessment - 02/26/21 1019     Subjective Patient reports that has had back and LE pain over the past month. He has a history of LBP over the course of many years and has been treated with chiropractic care. He painted a door which was a lot of bent forward position over the course of 4 days. He had good improvement with course of steroids which he finished meds yesterday. Much worse today. Taugh pickle ball yesterday and walked the dog. Used back massager after pickle ball and everythig improved. Has used the TENS unitwith some improvement as well.    Pertinent History cervical disc sx ~ 5 yrs ago; shoulder surgery 2 yrs ago; LBP for 10 or more yrs treated with chiropractic care ~ 1x/month for the past 10 yrs; AODM; heart stent    Diagnostic tests x rays - FINDINGS:  Mild dextrocurvature. Vertebral body heights are maintained.  Moderate severe degenerative changes at L1-L2 and L2-L3 with  moderate degenerative changes L3 through S1. Facet degenerative  changes of the lower lumbar spine. Aortic atherosclerosis.     IMPRESSION:  Multilevel degenerative changes.  No acute osseous abnormality    Patient Stated Goals get active again to prevent weight gain; play pickle ball    Currently in Pain? Yes    Pain Score 7     Pain Location Back    Pain Orientation Left;Right;Posterior;Lower    Pain Descriptors / Indicators Sharp;Dull;Radiating    Pain Type Acute pain;Chronic pain    Pain Radiating Towards Lt posterior thigh; bilat knee pain    Pain Onset More than a month ago    Pain Frequency Constant    Aggravating Factors  sitting; walking; bending; lifting;    Pain Relieving Factors meds; ice;  massager                [facility] PT Assessment - 02/26/21 0001       Assessment   Medical Diagnosis LBP with Lt > Rt LE radicular pain    Referring Provider (PT) Dr Lynne Leader    Onset Date/Surgical Date 02/05/21    Hand Dominance Right    Next MD Visit 04/02/21    Prior Therapy here for back; neck; LE's      Precautions   Precautions None      Restrictions   Weight Bearing Restrictions No      Balance Screen   Has the patient fallen in the past 6 months No    Has the patient had a decrease in activity level because of a fear of falling?  No    Is the patient reluctant to leave their home because of a fear of falling?  No      Home Ecologist residence    Living Arrangements Spouse/significant other      Prior Function   Level of [place] Retired    Biomedical scientist retired Electronics engineer; Psychologist, occupational    Leisure pickle ball 3x/wk ~ 2.5 hours; walking daily x 20 min 1-2 times/day; yard work; Forensic psychologist; painting      Observation/Other Assessments   Focus on Therapeutic Outcomes (FOTO)  24      Sensation   Additional Comments tingling bilar LE's to knees posterior thighs      Posture/Postural Control   Posture Comments flexed forward in sitting and standing      AROM   Overall AROM Comments significant tightness through bilat hips Rt > Lt    Lumbar Flexion 60% stiffness    Lumbar Extension neutral pain Lt hip area    Lumbar - Right Side Bend 50% pain in the Lt LB    Lumbar - Left Side Bend 50%    Lumbar - Right Rotation 75%    Lumbar - Left Rotation 75% pain in the Lt LB      Strength   Overall Strength Comments functioinal strength bilat LE's not tested resistively      Flexibility   Hamstrings ~ 75 deg Lt; 70 deg Rt    Quadriceps tight Rt > Lt    ITB WFL's bilat    Piriformis tight Rt > Lt      Palpation   Spinal mobility hypomobile lumbar spine greatest at  L3/4/5    Palpation comment muscular tightness bilat lumbar paraspinals      Ambulation/Gait   Gait Comments ambulates with forward flexed trunk and hips; antalgic gait      Balance   Balance Assessed --   SLS <3-5 sec bilat  Objective measurements completed on examination: See above findings.       [facility] Adult PT Treatment/Exercise - 02/26/21 0001       Self-Care   Self-Care Other Self-Care Comments    Other Self-Care Comments  education re posture and alignment; avoiding flexed forward posture      Therapeutic Activites    Therapeutic Activities Other Therapeutic Activities    Other Therapeutic Activities tightening core sucking belly button to back bone with tightening pelvic floor      Neuro Re-ed    Neuro Re-ed Details  working on upright posture and alignment back to wall engaging core      Lumbar Exercises: Stretches   Prone on Elbows Stretch 2 reps;60 seconds    Prone on Elbows Stretch Limitations pillow under chest propped on forearms      Lumbar Exercises: Standing   Other Standing Lumbar Exercises standing back at wall working on upright posture; 4-5 min focus on tightening core and maintaining upright posture      Manual Therapy   Manual therapy comments pt prone    Joint Mobilization Grade III PA mobs lumbar spine - downward/caudal pressure through the sacrum holding 1-2 min                     PT Education - 02/26/21 1054     Education Details HEP POC posture    Person(s) Educated Patient    Methods Explanation;Demonstration;Tactile cues;Verbal cues;Handout    Comprehension Verbalized understanding;Returned demonstration;Verbal cues required;Tactile cues required                 PT Long Term Goals - 02/26/21 1201       PT LONG TERM GOAL #1   Title Patient to be independent with advanced HEP.    Time 6    Period Weeks    Status New    Target Date 04/09/21      PT LONG TERM GOAL #2   Title  Improve trunk and LE mobility/ROM/tissue extensibility demonstrated by increased lumbar extension to 10% with no pain    Time 6    Period Weeks    Status New    Target Date 04/09/21      PT LONG TERM GOAL #3   Title Patient reports return to normal functional and recreational activities with minimal to no increase in pain > 1-2/10 pain    Time 6    Period Weeks    Status New    Target Date 04/09/21      PT LONG TERM GOAL #4   Title Patient reports return to pickleball at level prior to injury    Time 6    Period Weeks    Status New    Target Date 04/09/21      PT LONG TERM GOAL #5   Title Improve functional limitation score to 49    Time 6    Period Weeks    Status New    Target Date 04/09/21                    Plan - 02/26/21 1152     Clinical Impression Statement Patient presents with ~ 1 month history of increased LBP with radicular symptoms into the Lt > Rt posterior thigh to knees. He responded well to steroid dose pack but finished this yesterday and has noted increased pain today. Patient has forward flexed posture; limited trunk and LE mobility/ROM; poor core strength; pain with  functional activities. Symptoms appear discogenic in nature based on presentation and history. He responded well to intervention today with good decrease in LBP and radicular Lt LE pain. Patient will benefit from gentle extension program and core stabilization. He will benefit from trial of PT to address problems identified.    Stability/Clinical Decision Making Stable/Uncomplicated    Clinical Decision Making Low    Rehab Potential Good    PT Frequency 2x / week    PT Duration 6 weeks    PT Treatment/Interventions ADLs/Self Care Home Management;Aquatic Therapy;Cryotherapy;Electrical Stimulation;Iontophoresis 4mg /ml Dexamethasone;Moist Heat;Traction;Ultrasound;Functional mobility training;Therapeutic activities;Therapeutic exercise;Balance training;Neuromuscular  re-education;Patient/family education;Manual techniques;Passive range of motion;Dry needling;Taping    PT Next Visit Plan review HEP; progress with gentle extension program as tolerated; core stabilization; manual work including lumbar mobs; modalities as indicated (has TENS unit at home)             Patient will benefit from skilled therapeutic intervention in order to improve the following deficits and impairments:  Abnormal gait, Difficulty walking, Decreased activity tolerance, Pain, Hypomobility, Impaired flexibility, Improper body mechanics, Decreased mobility, Decreased strength, Postural dysfunction, Impaired sensation  Visit Diagnosis: Acute bilateral low back pain with bilateral sciatica  Other symptoms and signs involving the musculoskeletal system  Weakness generalized  Stiffness of both hip joints     Problem List Patient Active Problem List   Diagnosis Date Noted   Osteoarthritis    Low back pain    Heart murmur    Exercise-induced asthma    Diabetes mellitus without complication (HCC)    Arthritis    Anemia    Myofascial pain syndrome, cervical 07/28/2019   Lateral epicondylitis of right elbow 07/28/2019   Nontraumatic incomplete tear of right rotator cuff 07/25/2019   Tendinopathy of right biceps tendon 07/25/2019   Erectile dysfunction 07/11/2019   Mild persistent asthma/cough variant asthma 04/13/2019   Allergic rhinitis with a possible nonallergic component 04/13/2019   Acid reflux 04/13/2019   Chronic cough 04/13/2019   Hoarseness 04/13/2019   Cervical radicular pain 01/10/2019   Ulnar neuropathy at elbow of right upper extremity 01/03/2019   Acute bilateral low back pain without sciatica 01/03/2019   Onychomycosis 05/25/2018   Palpitations 07/06/2017   CAD (coronary artery disease) 07/05/2017   Family history of early CAD 07/05/2017   Abnormal findings on diagnostic imaging of cardiovascular system 06/23/2017   Low vitamin D level 04/22/2017    Pain in right ankle and joints of right foot 10/05/2016   Hemarthrosis of right elbow 02/03/2016   Small thenar eminence 02/03/2016   Cervical disc disorder with radiculopathy of cervical region 10/07/2015   Incomplete rotator cuff tear 09/10/2015   Nonischemic cardiomyopathy ([place]) 05/15/2015   Fatigue 05/15/2015   Dyspnea on exertion 05/15/2015   Decreased cardiac ejection fraction 04/02/2015   Strain of latissimus dorsi muscle 01/21/2015   Shoulder bursitis 10/12/2014   Stenosis of lateral recess of lumbar spine 10/12/2014   BPPV (benign paroxysmal positional vertigo) 08/14/2014   Hyperlipidemia 01/19/2014   Anemia, unspecified 01/19/2014   ADD (attention deficit disorder) 01/19/2014   Type 2 diabetes mellitus with hyperglycemia, without long-term current use of insulin ([place]) 12/22/2013   Hypogonadism in male 12/22/2013   Arthritis of hand, left 12/22/2013    Everardo All, PT MPH  02/26/2021, 12:06 PM  [facility] [place] [address] [place], [place], [address] Phone: [phone]   Fax:  [phone]  Name: CYNCERE SONTAG MRN: [id number] Date of Birth: 1956/03/18

## 2021-02-26 NOTE — Patient Instructions (Signed)
Access Code: [email or web address] URL: [email or web address] Date: 02/26/2021 Prepared by: Gillermo Murdoch  Exercises Prone Press Up On Elbows - 2 x daily - 7 x weekly - 1 sets - 3 reps - 30 sec hold Standing Back Extension - 5-8 x daily - 7 x weekly Supine Transversus Abdominis Bracing with Pelvic Floor Contraction - 2 x daily - 7 x weekly - 1 sets - 10 reps - 10sec hold  Patient Education Biomedical scientist

## 2021-02-28 ENCOUNTER — Encounter: Payer: Self-pay | Admitting: Physical Therapy

## 2021-02-28 ENCOUNTER — Ambulatory Visit (INDEPENDENT_AMBULATORY_CARE_PROVIDER_SITE_OTHER): Payer: 59 | Admitting: Physical Therapy

## 2021-02-28 ENCOUNTER — Other Ambulatory Visit: Payer: Self-pay

## 2021-02-28 DIAGNOSIS — R29898 Other symptoms and signs involving the musculoskeletal system: Secondary | ICD-10-CM

## 2021-02-28 DIAGNOSIS — R531 Weakness: Secondary | ICD-10-CM

## 2021-02-28 DIAGNOSIS — M25652 Stiffness of left hip, not elsewhere classified: Secondary | ICD-10-CM

## 2021-02-28 DIAGNOSIS — M5442 Lumbago with sciatica, left side: Secondary | ICD-10-CM | POA: Diagnosis not present

## 2021-02-28 DIAGNOSIS — M5441 Lumbago with sciatica, right side: Secondary | ICD-10-CM

## 2021-02-28 DIAGNOSIS — M25651 Stiffness of right hip, not elsewhere classified: Secondary | ICD-10-CM

## 2021-02-28 NOTE — Therapy (Signed)
[place], [place], [address] Phone: [phone]   Fax:  [phone]  Physical Therapy Treatment  Patient Details  Name: LAWERENCE DERY MRN: [id number] Date of Birth: [birth year] Referring Provider (PT): Dr Lynne Leader   Encounter Date: 02/28/2021   PT End of Session - 02/28/21 1024     Visit Number 2    Number of Visits 12    Date for PT Re-Evaluation 04/09/21    PT Start Time 29    PT Stop Time 1100    PT Time Calculation (min) 41 min             Past Medical History:  Diagnosis Date   ADD (attention deficit disorder)    Anemia    Arthritis    CAD (coronary artery disease)    2/19 PCI/DESx1 to Lcx   Diabetes mellitus without complication ([place])    Exercise-induced asthma    Heart murmur    Hyperlipidemia    family hx of high cholesterol   Hypogonadism in male    Low back pain    Osteoarthritis     Past Surgical History:  Procedure Laterality Date   CORONARY STENT INTERVENTION N/A 06/23/2017   Procedure: CORONARY STENT INTERVENTION;  Surgeon: Leonie Man, MD;  Location: [facility];  Service: Cardiovascular;  Laterality: N/A;   INTRAVASCULAR PRESSURE WIRE/FFR STUDY N/A 06/23/2017   Procedure: INTRAVASCULAR PRESSURE WIRE/FFR STUDY;  Surgeon: Leonie Man, MD;  Location: [facility];  Service: Cardiovascular;  Laterality: N/A;   KNEE ARTHROSCOPY     LEFT HEART CATH AND CORONARY ANGIOGRAPHY N/A 06/23/2017   Procedure: LEFT HEART CATH AND CORONARY ANGIOGRAPHY;  Surgeon: Leonie Man, MD;  Location: [facility];  Service: Cardiovascular;  Laterality: N/A;   SHOULDER ARTHROSCOPY WITH BICEPSTENOTOMY Right 09/06/2019   Procedure: SHOULDER ARTHROSCOPY WITH BICEPSTENOTOMY;  Surgeon: Leandrew Koyanagi, MD;  Location: [facility];  Service: Orthopedics;  Laterality: Right;   SHOULDER ARTHROSCOPY WITH SUBACROMIAL DECOMPRESSION Right 09/06/2019    Procedure: RIGHT SHOULDER ARTHROSCOPY WITH EXTENSIVE DEBRIDEMENT, SUBACROMIAL DECOMPRESSION, BICEPS TENOTOMY;  Surgeon: Leandrew Koyanagi, MD;  Location: [facility];  Service: Orthopedics;  Laterality: Right;   TENOTOMY ACHILLES TENDON      There were no vitals filed for this visit.   Subjective Assessment - 02/28/21 1028     Subjective "It was aweful yesterday. I had sharp pain across my low back when I got up.  I just sit and don't move".    Currently in Pain? Yes    Pain Score 8     Pain Location Back    Pain Orientation Lower   L>R   Pain Descriptors / Indicators Sharp    Pain Radiating Towards to bilat knees    Aggravating Factors  sitting, walking, bending, lifting    Pain Relieving Factors meds, ice, massager.                [facility] PT Assessment - 02/28/21 0001       Assessment   Medical Diagnosis LBP with Lt > Rt LE radicular pain    Referring Provider (PT) Dr Lynne Leader    Onset Date/Surgical Date 02/05/21    Hand Dominance Right    Next MD Visit 04/02/21    Prior Therapy here for back; neck; LE's               [facility] Adult PT Treatment/Exercise -  02/28/21 0001       Self-Care   Self-Care Posture    Posture educated on lumbar support for improved posture      Lumbar Exercises: Stretches   Passive Hamstring Stretch Right;Left;1 rep;20 seconds    Standing Extension 5 reps;5 seconds   hands on elevated table and on hips.   Prone on Elbows Stretch 30 seconds;5 reps      Lumbar Exercises: Aerobic   Nustep L5: legs only x 5.5    Other Aerobic Exercise single laps around gym      Lumbar Exercises: Seated   Sit to Stand 10 reps   core engaged, arms forward.  pain free.   Other Seated Lumbar Exercises repeated sit to stand simulating getting in/out of car x 4 reps      Lumbar Exercises: Supine   Ab Set 10 reps   10 sec   Bent Knee Raise 10 reps;2 seconds   with ab set     Modalities   Modalities --   deferred; discussed pad placement at  home.               PT Long Term Goals - 02/26/21 1201       PT LONG TERM GOAL #1   Title Patient to be independent with advanced HEP.    Time 6    Period Weeks    Status New    Target Date 04/09/21      PT LONG TERM GOAL #2   Title Improve trunk and LE mobility/ROM/tissue extensibility demonstrated by increased lumbar extension to 10% with no pain    Time 6    Period Weeks    Status New    Target Date 04/09/21      PT LONG TERM GOAL #3   Title Patient reports return to normal functional and recreational activities with minimal to no increase in pain > 1-2/10 pain    Time 6    Period Weeks    Status New    Target Date 04/09/21      PT LONG TERM GOAL #4   Title Patient reports return to pickleball at level prior to injury    Time 6    Period Weeks    Status New    Target Date 04/09/21      PT LONG TERM GOAL #5   Title Improve functional limitation score to 49    Time 6    Period Weeks    Status New    Target Date 04/09/21                   Plan - 02/28/21 1035     Clinical Impression Statement Pt reported resolution of low back pain after 3 rep of prone on elbows exercise and remained very low throughout session. Focus of exercises was abdominal bracing with transitional movements and importance of extension based exercises. Goals are ongoing.    Stability/Clinical Decision Making Stable/Uncomplicated    Rehab Potential Good    PT Frequency 2x / week    PT Duration 6 weeks    PT Treatment/Interventions ADLs/Self Care Home Management;Aquatic Therapy;Cryotherapy;Electrical Stimulation;Iontophoresis 4mg /ml Dexamethasone;Moist Heat;Traction;Ultrasound;Functional mobility training;Therapeutic activities;Therapeutic exercise;Balance training;Neuromuscular re-education;Patient/family education;Manual techniques;Passive range of motion;Dry needling;Taping    PT Next Visit Plan progress with gentle extension program as tolerated; core stabilization; manual  work including lumbar mobs; modalities as indicated (has TENS unit at home)    Consulted and Agree with Plan of Care Patient  Patient will benefit from skilled therapeutic intervention in order to improve the following deficits and impairments:  Abnormal gait, Difficulty walking, Decreased activity tolerance, Pain, Hypomobility, Impaired flexibility, Improper body mechanics, Decreased mobility, Decreased strength, Postural dysfunction, Impaired sensation  Visit Diagnosis: Acute bilateral low back pain with bilateral sciatica  Other symptoms and signs involving the musculoskeletal system  Weakness generalized  Stiffness of both hip joints     Problem List Patient Active Problem List   Diagnosis Date Noted   Osteoarthritis    Low back pain    Heart murmur    Exercise-induced asthma    Diabetes mellitus without complication (HCC)    Arthritis    Anemia    Myofascial pain syndrome, cervical 07/28/2019   Lateral epicondylitis of right elbow 07/28/2019   Nontraumatic incomplete tear of right rotator cuff 07/25/2019   Tendinopathy of right biceps tendon 07/25/2019   Erectile dysfunction 07/11/2019   Mild persistent asthma/cough variant asthma 04/13/2019   Allergic rhinitis with a possible nonallergic component 04/13/2019   Acid reflux 04/13/2019   Chronic cough 04/13/2019   Hoarseness 04/13/2019   Cervical radicular pain 01/10/2019   Ulnar neuropathy at elbow of right upper extremity 01/03/2019   Acute bilateral low back pain without sciatica 01/03/2019   Onychomycosis 05/25/2018   Palpitations 07/06/2017   CAD (coronary artery disease) 07/05/2017   Family history of early CAD 07/05/2017   Abnormal findings on diagnostic imaging of cardiovascular system 06/23/2017   Low vitamin D level 04/22/2017   Pain in right ankle and joints of right foot 10/05/2016   Hemarthrosis of right elbow 02/03/2016   Small thenar eminence 02/03/2016   Cervical disc disorder with  radiculopathy of cervical region 10/07/2015   Incomplete rotator cuff tear 09/10/2015   Nonischemic cardiomyopathy ([place]) 05/15/2015   Fatigue 05/15/2015   Dyspnea on exertion 05/15/2015   Decreased cardiac ejection fraction 04/02/2015   Strain of latissimus dorsi muscle 01/21/2015   Shoulder bursitis 10/12/2014   Stenosis of lateral recess of lumbar spine 10/12/2014   BPPV (benign paroxysmal positional vertigo) 08/14/2014   Hyperlipidemia 01/19/2014   Anemia, unspecified 01/19/2014   ADD (attention deficit disorder) 01/19/2014   Type 2 diabetes mellitus with hyperglycemia, without long-term current use of insulin ([place]) 12/22/2013   Hypogonadism in male 12/22/2013   Arthritis of hand, left 12/22/2013   Kerin Perna, PTA 02/28/21 11:09 AM  [place], [place], [address] Phone: [phone]   Fax:  [phone]  Name: HAKAN NUDELMAN MRN: [id number] Date of Birth: 11-09-1955

## 2021-03-04 ENCOUNTER — Ambulatory Visit (INDEPENDENT_AMBULATORY_CARE_PROVIDER_SITE_OTHER): Payer: 59 | Admitting: Physical Therapy

## 2021-03-04 ENCOUNTER — Other Ambulatory Visit: Payer: Self-pay

## 2021-03-04 ENCOUNTER — Encounter: Payer: Self-pay | Admitting: Physical Therapy

## 2021-03-04 DIAGNOSIS — R29898 Other symptoms and signs involving the musculoskeletal system: Secondary | ICD-10-CM | POA: Diagnosis not present

## 2021-03-04 DIAGNOSIS — R531 Weakness: Secondary | ICD-10-CM | POA: Diagnosis not present

## 2021-03-04 DIAGNOSIS — M5442 Lumbago with sciatica, left side: Secondary | ICD-10-CM

## 2021-03-04 DIAGNOSIS — M5441 Lumbago with sciatica, right side: Secondary | ICD-10-CM

## 2021-03-04 NOTE — Therapy (Signed)
[facility] [place], [place], [address] Phone: [phone]   Fax:  [phone]  Physical Therapy Treatment  Patient Details  Name: Reginald Rios MRN: [id number] Date of Birth: 1956-04-26 Referring Provider (PT): Dr Lynne Leader   Encounter Date: 03/04/2021   PT End of Session - 03/04/21 1026     Visit Number 3    Number of Visits 12    Date for PT Re-Evaluation 04/09/21    PT Start Time 1020    PT Stop Time 1100    PT Time Calculation (min) 40 min             Past Medical History:  Diagnosis Date   ADD (attention deficit disorder)    Anemia    Arthritis    CAD (coronary artery disease)    2/19 PCI/DESx1 to Lcx   Diabetes mellitus without complication ([facility])    Exercise-induced asthma    Heart murmur    Hyperlipidemia    family hx of high cholesterol   Hypogonadism in male    Low back pain    Osteoarthritis     Past Surgical History:  Procedure Laterality Date   CORONARY STENT INTERVENTION N/A 06/23/2017   Procedure: CORONARY STENT INTERVENTION;  Surgeon: Leonie Man, MD;  Location: [facility];  Service: Cardiovascular;  Laterality: N/A;   INTRAVASCULAR PRESSURE WIRE/FFR STUDY N/A 06/23/2017   Procedure: INTRAVASCULAR PRESSURE WIRE/FFR STUDY;  Surgeon: Leonie Man, MD;  Location: [facility];  Service: Cardiovascular;  Laterality: N/A;   KNEE ARTHROSCOPY     LEFT HEART CATH AND CORONARY ANGIOGRAPHY N/A 06/23/2017   Procedure: LEFT HEART CATH AND CORONARY ANGIOGRAPHY;  Surgeon: Leonie Man, MD;  Location: [facility];  Service: Cardiovascular;  Laterality: N/A;   SHOULDER ARTHROSCOPY WITH BICEPSTENOTOMY Right 09/06/2019   Procedure: SHOULDER ARTHROSCOPY WITH BICEPSTENOTOMY;  Surgeon: Leandrew Koyanagi, MD;  Location: [facility];  Service: Orthopedics;  Laterality: Right;   SHOULDER ARTHROSCOPY WITH SUBACROMIAL DECOMPRESSION Right 09/06/2019    Procedure: RIGHT SHOULDER ARTHROSCOPY WITH EXTENSIVE DEBRIDEMENT, SUBACROMIAL DECOMPRESSION, BICEPS TENOTOMY;  Surgeon: Leandrew Koyanagi, MD;  Location: [facility];  Service: Orthopedics;  Laterality: Right;   TENOTOMY ACHILLES TENDON      There were no vitals filed for this visit.   Subjective Assessment - 03/04/21 1045     Subjective Pt reports some improvement. He has been working on core activation and back extension daily.    Currently in Pain? Yes    Pain Score 4     Pain Location Back    Pain Orientation Lower    Pain Descriptors / Indicators Aching;Dull    Aggravating Factors  bending, lifting    Pain Relieving Factors extension                [facility] PT Assessment - 03/04/21 0001       Assessment   Medical Diagnosis LBP with Lt > Rt LE radicular pain    Referring Provider (PT) Dr Lynne Leader    Onset Date/Surgical Date 02/05/21    Hand Dominance Right    Next MD Visit 04/02/21    Prior Therapy here for back; neck; LE's               [facility] Adult PT Treatment/Exercise - 03/04/21 0001       Lumbar Exercises: Stretches   Passive Hamstring Stretch Right;Left;2 reps;30 seconds  Lower Trunk Rotation 3 reps;10 seconds   arms i nT   Standing Extension 1 rep;10 seconds    Prone on Elbows Stretch 3 reps;20 seconds    Quad Stretch Left;3 reps;Right;2 reps;20 seconds   prone with strap   Piriformis Stretch Right;Left;2 reps;30 seconds    Other Lumbar Stretch Exercise bilat shoulder flexion on wall for lat/back stretch x 2 reps x 10 sec      Lumbar Exercises: Aerobic   Stationary Bike L2: 5 min for warm up    Other Aerobic Exercise single laps around gym      Lumbar Exercises: Standing   Other Standing Lumbar Exercises opp arm leg with arms above head x 5 reps each side.    Other Standing Lumbar Exercises tactile cues for upright posture  (shoulders directly over hips) - core engaged.      Lumbar Exercises: Seated   Sit to Stand 10 reps   core  engaged, arms forward.  pain free.   Sit to Stand Limitations eccentric lowering               PT Long Term Goals - 02/26/21 1201       PT LONG TERM GOAL #1   Title Patient to be independent with advanced HEP.    Time 6    Period Weeks    Status New    Target Date 04/09/21      PT LONG TERM GOAL #2   Title Improve trunk and LE mobility/ROM/tissue extensibility demonstrated by increased lumbar extension to 10% with no pain    Time 6    Period Weeks    Status New    Target Date 04/09/21      PT LONG TERM GOAL #3   Title Patient reports return to normal functional and recreational activities with minimal to no increase in pain > 1-2/10 pain    Time 6    Period Weeks    Status New    Target Date 04/09/21      PT LONG TERM GOAL #4   Title Patient reports return to pickleball at level prior to injury    Time 6    Period Weeks    Status New    Target Date 04/09/21      PT LONG TERM GOAL #5   Title Improve functional limitation score to 49    Time 6    Period Weeks    Status New    Target Date 04/09/21                   Plan - 03/04/21 1101     Clinical Impression Statement Continued improvement in low back pain with focus on extension based exercises and posture re-education.  Pt reported pain decreased by 2 points at end of session.  Progressing well towards LTGs.    Stability/Clinical Decision Making Stable/Uncomplicated    Rehab Potential Good    PT Frequency 2x / week    PT Duration 6 weeks    PT Treatment/Interventions ADLs/Self Care Home Management;Aquatic Therapy;Cryotherapy;Electrical Stimulation;Iontophoresis 4mg /ml Dexamethasone;Moist Heat;Traction;Ultrasound;Functional mobility training;Therapeutic activities;Therapeutic exercise;Balance training;Neuromuscular re-education;Patient/family education;Manual techniques;Passive range of motion;Dry needling;Taping    PT Next Visit Plan progress with gentle extension program as tolerated; core  stabilization; manual work including lumbar mobs; modalities as indicated (has TENS unit at home)- Progress HEP.    Consulted and Agree with Plan of Care Patient             Patient will benefit  from skilled therapeutic intervention in order to improve the following deficits and impairments:  Abnormal gait, Difficulty walking, Decreased activity tolerance, Pain, Hypomobility, Impaired flexibility, Improper body mechanics, Decreased mobility, Decreased strength, Postural dysfunction, Impaired sensation  Visit Diagnosis: Acute bilateral low back pain with bilateral sciatica  Other symptoms and signs involving the musculoskeletal system  Weakness generalized     Problem List Patient Active Problem List   Diagnosis Date Noted   Osteoarthritis    Low back pain    Heart murmur    Exercise-induced asthma    Diabetes mellitus without complication ([facility])    Arthritis    Anemia    Myofascial pain syndrome, cervical 07/28/2019   Lateral epicondylitis of right elbow 07/28/2019   Nontraumatic incomplete tear of right rotator cuff 07/25/2019   Tendinopathy of right biceps tendon 07/25/2019   Erectile dysfunction 07/11/2019   Mild persistent asthma/cough variant asthma 04/13/2019   Allergic rhinitis with a possible nonallergic component 04/13/2019   Acid reflux 04/13/2019   Chronic cough 04/13/2019   Hoarseness 04/13/2019   Cervical radicular pain 01/10/2019   Ulnar neuropathy at elbow of right upper extremity 01/03/2019   Acute bilateral low back pain without sciatica 01/03/2019   Onychomycosis 05/25/2018   Palpitations 07/06/2017   CAD (coronary artery disease) 07/05/2017   Family history of early CAD 07/05/2017   Abnormal findings on diagnostic imaging of cardiovascular system 06/23/2017   Low vitamin D level 04/22/2017   Pain in right ankle and joints of right foot 10/05/2016   Hemarthrosis of right elbow 02/03/2016   Small thenar eminence 02/03/2016   Cervical disc disorder  with radiculopathy of cervical region 10/07/2015   Incomplete rotator cuff tear 09/10/2015   Nonischemic cardiomyopathy ([place]) 05/15/2015   Fatigue 05/15/2015   Dyspnea on exertion 05/15/2015   Decreased cardiac ejection fraction 04/02/2015   Strain of latissimus dorsi muscle 01/21/2015   Shoulder bursitis 10/12/2014   Stenosis of lateral recess of lumbar spine 10/12/2014   BPPV (benign paroxysmal positional vertigo) 08/14/2014   Hyperlipidemia 01/19/2014   Anemia, unspecified 01/19/2014   ADD (attention deficit disorder) 01/19/2014   Type 2 diabetes mellitus with hyperglycemia, without long-term current use of insulin ([place]) 12/22/2013   Hypogonadism in male 12/22/2013   Arthritis of hand, left 12/22/2013   Kerin Perna, PTA 03/04/21 1:09 PM  [facility] [place] [address] [place], [place], [address] Phone: [phone]   Fax:  [phone]  Name: Reginald Rios MRN: [id number] Date of Birth: 1955-09-27

## 2021-03-07 ENCOUNTER — Ambulatory Visit (INDEPENDENT_AMBULATORY_CARE_PROVIDER_SITE_OTHER): Payer: 59 | Admitting: Physical Therapy

## 2021-03-07 ENCOUNTER — Other Ambulatory Visit: Payer: Self-pay

## 2021-03-07 DIAGNOSIS — R531 Weakness: Secondary | ICD-10-CM | POA: Diagnosis not present

## 2021-03-07 DIAGNOSIS — M5441 Lumbago with sciatica, right side: Secondary | ICD-10-CM | POA: Diagnosis not present

## 2021-03-07 DIAGNOSIS — M5442 Lumbago with sciatica, left side: Secondary | ICD-10-CM | POA: Diagnosis not present

## 2021-03-07 DIAGNOSIS — R29898 Other symptoms and signs involving the musculoskeletal system: Secondary | ICD-10-CM

## 2021-03-07 NOTE — Therapy (Signed)
[place], [place], [address] Phone: [phone]   Fax:  [phone]  Physical Therapy Treatment  Patient Details  Name: Reginald Rios MRN: [id number] Date of Birth: 1955/12/21 Referring Provider (PT): Dr Lynne Leader   Encounter Date: 03/07/2021   PT End of Session - 03/07/21 1022     Visit Number 4    Number of Visits 12    Date for PT Re-Evaluation 04/09/21    PT Start Time 1017    PT Stop Time 1055    PT Time Calculation (min) 38 min    Activity Tolerance Patient tolerated treatment well    Behavior During Therapy [facility] for tasks assessed/performed             Past Medical History:  Diagnosis Date   ADD (attention deficit disorder)    Anemia    Arthritis    CAD (coronary artery disease)    2/19 PCI/DESx1 to Lcx   Diabetes mellitus without complication ([place])    Exercise-induced asthma    Heart murmur    Hyperlipidemia    family hx of high cholesterol   Hypogonadism in male    Low back pain    Osteoarthritis     Past Surgical History:  Procedure Laterality Date   CORONARY STENT INTERVENTION N/A 06/23/2017   Procedure: CORONARY STENT INTERVENTION;  Surgeon: Leonie Man, MD;  Location: [facility];  Service: Cardiovascular;  Laterality: N/A;   INTRAVASCULAR PRESSURE WIRE/FFR STUDY N/A 06/23/2017   Procedure: INTRAVASCULAR PRESSURE WIRE/FFR STUDY;  Surgeon: Leonie Man, MD;  Location: [facility];  Service: Cardiovascular;  Laterality: N/A;   KNEE ARTHROSCOPY     LEFT HEART CATH AND CORONARY ANGIOGRAPHY N/A 06/23/2017   Procedure: LEFT HEART CATH AND CORONARY ANGIOGRAPHY;  Surgeon: Leonie Man, MD;  Location: [facility];  Service: Cardiovascular;  Laterality: N/A;   SHOULDER ARTHROSCOPY WITH BICEPSTENOTOMY Right 09/06/2019   Procedure: SHOULDER ARTHROSCOPY WITH BICEPSTENOTOMY;  Surgeon: Leandrew Koyanagi, MD;  Location: [facility];   Service: Orthopedics;  Laterality: Right;   SHOULDER ARTHROSCOPY WITH SUBACROMIAL DECOMPRESSION Right 09/06/2019   Procedure: RIGHT SHOULDER ARTHROSCOPY WITH EXTENSIVE DEBRIDEMENT, SUBACROMIAL DECOMPRESSION, BICEPS TENOTOMY;  Surgeon: Leandrew Koyanagi, MD;  Location: [facility];  Service: Orthopedics;  Laterality: Right;   TENOTOMY ACHILLES TENDON      There were no vitals filed for this visit.   Subjective Assessment - 03/07/21 1020     Subjective Pt reports that he did 4 hrs of sanding yesterday and his back is sore today.  He also took dog for a walk.  He tried to engage core muscles during sanding.    Currently in Pain? Yes    Pain Score 7     Pain Location Back    Pain Orientation Lower    Pain Descriptors / Indicators Aching;Sore    Aggravating Factors  bending, lifting    Pain Relieving Factors extension                [facility] PT Assessment - 03/07/21 0001       Assessment   Medical Diagnosis LBP with Lt > Rt LE radicular pain    Referring Provider (PT) Dr Lynne Leader    Onset Date/Surgical Date 02/05/21    Hand Dominance Right    Next MD Visit 04/02/21    Prior Therapy here for back; neck; LE's  [facility] Adult PT Treatment/Exercise - 03/07/21 0001       Lumbar Exercises: Stretches   Passive Hamstring Stretch Right;Left;2 reps;30 seconds    Lower Trunk Rotation 3 reps;10 seconds   arms i nT   Lower Trunk Rotation Limitations and wide feet x 3 reps    Standing Extension 1 rep;10 seconds    Prone on Elbows Stretch 3 reps;20 seconds    Quad Stretch Left;Right;2 reps;20 seconds   prone with strap   Piriformis Stretch Right;Left;2 reps;30 seconds    Gastroc Stretch Right;Left;2 reps;20 seconds   runners stretch     Lumbar Exercises: Aerobic   Stationary Bike L2: 5 min for warm up    Other Aerobic Exercise single laps around gym to assess response to exercises.      Lumbar Exercises: Standing   Other Standing Lumbar Exercises opp arm  leg with arms above head x 5 reps each side.    Other Standing Lumbar Exercises mid and high level doorway stretch x 20 sec x 2 reps each position.      Lumbar Exercises: Seated   Sit to Stand Limitations 2 reps with eccentric lowering  improved control from previous session      Knee/Hip Exercises: Standing   Forward Step Up Right;Left;1 set;10 reps;Hand Hold: 1;Step Height: 8"   with core engagement   Wall Squat 1 set;15 reps;3 seconds   5# in/out from core.              PT Long Term Goals - 02/26/21 1201       PT LONG TERM GOAL #1   Title Patient to be independent with advanced HEP.    Time 6    Period Weeks    Status New    Target Date 04/09/21      PT LONG TERM GOAL #2   Title Improve trunk and LE mobility/ROM/tissue extensibility demonstrated by increased lumbar extension to 10% with no pain    Time 6    Period Weeks    Status New    Target Date 04/09/21      PT LONG TERM GOAL #3   Title Patient reports return to normal functional and recreational activities with minimal to no increase in pain > 1-2/10 pain    Time 6    Period Weeks    Status New    Target Date 04/09/21      PT LONG TERM GOAL #4   Title Patient reports return to pickleball at level prior to injury    Time 6    Period Weeks    Status New    Target Date 04/09/21      PT LONG TERM GOAL #5   Title Improve functional limitation score to 49    Time 6    Period Weeks    Status New    Target Date 04/09/21                   Plan - 03/07/21 1034     Clinical Impression Statement Pt's back irritated from sanding project yesterday.  He reported 5 point reduction in pain with standing exercises. Pt verbalizing more understanding of effects on core support and upright posture on functional mobility and pain level.  Pt progressing well towards LTGs.    Stability/Clinical Decision Making Stable/Uncomplicated    Rehab Potential Good    PT Frequency 2x / week    PT Duration 6 weeks    PT  Treatment/Interventions ADLs/Self Care  Home Management;Aquatic Therapy;Cryotherapy;Electrical Stimulation;Iontophoresis 4mg /ml Dexamethasone;Moist Heat;Traction;Ultrasound;Functional mobility training;Therapeutic activities;Therapeutic exercise;Balance training;Neuromuscular re-education;Patient/family education;Manual techniques;Passive range of motion;Dry needling;Taping    PT Next Visit Plan progress with gentle extension program as tolerated; core stabilization; manual work including lumbar mobs if indicated; modalities as indicated (has TENS unit at home)- Progress HEP.    PT Home Exercise Plan [email or web address]    Consulted and Agree with Plan of Care Patient             Patient will benefit from skilled therapeutic intervention in order to improve the following deficits and impairments:  Abnormal gait, Difficulty walking, Decreased activity tolerance, Pain, Hypomobility, Impaired flexibility, Improper body mechanics, Decreased mobility, Decreased strength, Postural dysfunction, Impaired sensation  Visit Diagnosis: Acute bilateral low back pain with bilateral sciatica  Other symptoms and signs involving the musculoskeletal system  Weakness generalized     Problem List Patient Active Problem List   Diagnosis Date Noted   Osteoarthritis    Low back pain    Heart murmur    Exercise-induced asthma    Diabetes mellitus without complication (HCC)    Arthritis    Anemia    Myofascial pain syndrome, cervical 07/28/2019   Lateral epicondylitis of right elbow 07/28/2019   Nontraumatic incomplete tear of right rotator cuff 07/25/2019   Tendinopathy of right biceps tendon 07/25/2019   Erectile dysfunction 07/11/2019   Mild persistent asthma/cough variant asthma 04/13/2019   Allergic rhinitis with a possible nonallergic component 04/13/2019   Acid reflux 04/13/2019   Chronic cough 04/13/2019   Hoarseness 04/13/2019   Cervical radicular pain 01/10/2019   Ulnar neuropathy at elbow of right  upper extremity 01/03/2019   Acute bilateral low back pain without sciatica 01/03/2019   Onychomycosis 05/25/2018   Palpitations 07/06/2017   CAD (coronary artery disease) 07/05/2017   Family history of early CAD 07/05/2017   Abnormal findings on diagnostic imaging of cardiovascular system 06/23/2017   Low vitamin D level 04/22/2017   Pain in right ankle and joints of right foot 10/05/2016   Hemarthrosis of right elbow 02/03/2016   Small thenar eminence 02/03/2016   Cervical disc disorder with radiculopathy of cervical region 10/07/2015   Incomplete rotator cuff tear 09/10/2015   Nonischemic cardiomyopathy ([place]) 05/15/2015   Fatigue 05/15/2015   Dyspnea on exertion 05/15/2015   Decreased cardiac ejection fraction 04/02/2015   Strain of latissimus dorsi muscle 01/21/2015   Shoulder bursitis 10/12/2014   Stenosis of lateral recess of lumbar spine 10/12/2014   BPPV (benign paroxysmal positional vertigo) 08/14/2014   Hyperlipidemia 01/19/2014   Anemia, unspecified 01/19/2014   ADD (attention deficit disorder) 01/19/2014   Type 2 diabetes mellitus with hyperglycemia, without long-term current use of insulin ([place]) 12/22/2013   Hypogonadism in male 12/22/2013   Arthritis of hand, left 12/22/2013   Kerin Perna, PTA 03/07/21 11:02 AM  [place] [address] [place], [place], [address] Phone: [phone]   Fax:  [phone]  Name: Reginald Rios MRN: [id number] Date of Birth: [birth year]

## 2021-03-10 ENCOUNTER — Other Ambulatory Visit: Payer: Self-pay | Admitting: Family Medicine

## 2021-03-10 ENCOUNTER — Other Ambulatory Visit (HOSPITAL_BASED_OUTPATIENT_CLINIC_OR_DEPARTMENT_OTHER): Payer: Self-pay

## 2021-03-10 MED ORDER — SITAGLIPTIN PHOSPHATE 100 MG PO TABS
ORAL_TABLET | Freq: Every day | ORAL | 3 refills | Status: DC
  Filled 2021-03-10: qty 30, fill #0
  Filled 2021-03-18: qty 30, 30d supply, fill #0
  Filled 2021-04-26 – 2021-04-29 (×2): qty 30, 30d supply, fill #1
  Filled 2021-06-01: qty 30, 30d supply, fill #2
  Filled 2021-07-01: qty 30, 30d supply, fill #3

## 2021-03-11 ENCOUNTER — Other Ambulatory Visit: Payer: Self-pay

## 2021-03-11 ENCOUNTER — Ambulatory Visit (INDEPENDENT_AMBULATORY_CARE_PROVIDER_SITE_OTHER): Payer: 59 | Admitting: Physical Therapy

## 2021-03-11 DIAGNOSIS — M25652 Stiffness of left hip, not elsewhere classified: Secondary | ICD-10-CM

## 2021-03-11 DIAGNOSIS — M5441 Lumbago with sciatica, right side: Secondary | ICD-10-CM | POA: Diagnosis not present

## 2021-03-11 DIAGNOSIS — M25651 Stiffness of right hip, not elsewhere classified: Secondary | ICD-10-CM | POA: Diagnosis not present

## 2021-03-11 DIAGNOSIS — M5442 Lumbago with sciatica, left side: Secondary | ICD-10-CM | POA: Diagnosis not present

## 2021-03-11 DIAGNOSIS — R531 Weakness: Secondary | ICD-10-CM | POA: Diagnosis not present

## 2021-03-11 DIAGNOSIS — R29898 Other symptoms and signs involving the musculoskeletal system: Secondary | ICD-10-CM

## 2021-03-11 NOTE — Therapy (Signed)
[facility] [place], [place], [address] Phone: [phone]   Fax:  [phone]  Physical Therapy Treatment  Patient Details  Name: Reginald Rios MRN: [id number] Date of Birth: [birth year] Referring Provider (PT): Dr Lynne Leader   Encounter Date: 03/11/2021   PT End of Session - 03/11/21 1031     Visit Number 5    Number of Visits 12    Date for PT Re-Evaluation 04/09/21    PT Start Time 1018    PT Stop Time 109   MHP last 8 min   PT Time Calculation (min) 44 min    Activity Tolerance Patient tolerated treatment well    Behavior During Therapy [facility] for tasks assessed/performed             Past Medical History:  Diagnosis Date   ADD (attention deficit disorder)    Anemia    Arthritis    CAD (coronary artery disease)    2/19 PCI/DESx1 to Lcx   Diabetes mellitus without complication ([place])    Exercise-induced asthma    Heart murmur    Hyperlipidemia    family hx of high cholesterol   Hypogonadism in male    Low back pain    Osteoarthritis     Past Surgical History:  Procedure Laterality Date   CORONARY STENT INTERVENTION N/A 06/23/2017   Procedure: CORONARY STENT INTERVENTION;  Surgeon: Leonie Man, MD;  Location: [facility];  Service: Cardiovascular;  Laterality: N/A;   INTRAVASCULAR PRESSURE WIRE/FFR STUDY N/A 06/23/2017   Procedure: INTRAVASCULAR PRESSURE WIRE/FFR STUDY;  Surgeon: Leonie Man, MD;  Location: [facility];  Service: Cardiovascular;  Laterality: N/A;   KNEE ARTHROSCOPY     LEFT HEART CATH AND CORONARY ANGIOGRAPHY N/A 06/23/2017   Procedure: LEFT HEART CATH AND CORONARY ANGIOGRAPHY;  Surgeon: Leonie Man, MD;  Location: [facility];  Service: Cardiovascular;  Laterality: N/A;   SHOULDER ARTHROSCOPY WITH BICEPSTENOTOMY Right 09/06/2019   Procedure: SHOULDER ARTHROSCOPY WITH BICEPSTENOTOMY;  Surgeon: Leandrew Koyanagi, MD;  Location: [facility];  Service: Orthopedics;  Laterality: Right;   SHOULDER ARTHROSCOPY WITH SUBACROMIAL DECOMPRESSION Right 09/06/2019   Procedure: RIGHT SHOULDER ARTHROSCOPY WITH EXTENSIVE DEBRIDEMENT, SUBACROMIAL DECOMPRESSION, BICEPS TENOTOMY;  Surgeon: Leandrew Koyanagi, MD;  Location: [facility];  Service: Orthopedics;  Laterality: Right;   TENOTOMY ACHILLES TENDON      There were no vitals filed for this visit.   Subjective Assessment - 03/11/21 1022     Subjective Pt reports that he has been in more pain since last night. "Any time I swivel at the waist, I have sharp pains in back, with tingling down the legs".    He has tried to do some of the stretches from HEP    Currently in Pain? Yes    Pain Score 6     Pain Location Back    Pain Orientation Left;Lower    Aggravating Factors  twisting at waist    Pain Relieving Factors TENS, ice, extension                [facility] PT Assessment - 03/11/21 0001       Assessment   Medical Diagnosis LBP with Lt > Rt LE radicular pain    Referring Provider (PT) Dr Lynne Leader    Onset Date/Surgical Date 02/05/21    Hand Dominance Right    Next MD Visit 04/02/21  Prior Therapy here for back; neck; LE's                [facility] Adult PT Treatment/Exercise - 03/11/21 0001       Lumbar Exercises: Stretches   Passive Hamstring Stretch Left;Right;2 reps;20 seconds   seated with hip hinge   Standing Extension 5 seconds;5 reps    Prone on Elbows Stretch 3 reps;20 seconds    Quad Stretch Left;3 reps;Right;2 reps;30 seconds    Piriformis Stretch Left;30 seconds;2 reps;Right;1 rep   seated pulling knee to opp shoulder     Lumbar Exercises: Aerobic   Nustep L5: arms/legs x 6 min for warm up ( pt moving around 38 spm)      Modalities   Modalities Moist Heat      Moist Heat Therapy   Number Minutes Moist Heat 8 Minutes    Moist Heat Location Lumbar Spine;Hip      Manual Therapy   Manual therapy comments skilled palpation to  assess response to DN              Trigger Point Dry Needling - 03/11/21 0001     Consent Given? Yes    Education Handout Provided Previously provided    Muscles Treated Back/Hip Gluteus medius;Quadratus lumborum;Gluteus maximus   provided by Isabelle Course, PT   Gluteus Medius Response Twitch response elicited;Palpable increased muscle length    Gluteus Maximus Response Twitch response elicited;Palpable increased muscle length    Quadratus Lumborum Response Twitch response elicited;Palpable increased muscle length                        PT Long Term Goals - 02/26/21 1201       PT LONG TERM GOAL #1   Title Patient to be independent with advanced HEP.    Time 6    Period Weeks    Status New    Target Date 04/09/21      PT LONG TERM GOAL #2   Title Improve trunk and LE mobility/ROM/tissue extensibility demonstrated by increased lumbar extension to 10% with no pain    Time 6    Period Weeks    Status New    Target Date 04/09/21      PT LONG TERM GOAL #3   Title Patient reports return to normal functional and recreational activities with minimal to no increase in pain > 1-2/10 pain    Time 6    Period Weeks    Status New    Target Date 04/09/21      PT LONG TERM GOAL #4   Title Patient reports return to pickleball at level prior to injury    Time 6    Period Weeks    Status New    Target Date 04/09/21      PT LONG TERM GOAL #5   Title Improve functional limitation score to 49    Time 6    Period Weeks    Status New    Target Date 04/09/21                   Plan - 03/11/21 1045     Clinical Impression Statement Flare up in symptoms after twisting back during exercises.   Session focused on reducing symptoms and back care education.  Encouraged pt to avoiding trunk rotation (for now) and bending his knees when reaching for low objects (GM:WNUUVOZ up dog poop).  Pt responded well to DN to LB and  Lt glute; provided by supervising PT, Isabelle Course.  Tingling in LEs resolved with POE position.  No new goals met this session.    Stability/Clinical Decision Making Stable/Uncomplicated    Rehab Potential Good    PT Frequency 2x / week    PT Duration 6 weeks    PT Treatment/Interventions ADLs/Self Care Home Management;Aquatic Therapy;Cryotherapy;Electrical Stimulation;Iontophoresis 35m/ml Dexamethasone;Moist Heat;Traction;Ultrasound;Functional mobility training;Therapeutic activities;Therapeutic exercise;Balance training;Neuromuscular re-education;Patient/family education;Manual techniques;Passive range of motion;Dry needling;Taping    PT Next Visit Plan progress with gentle extension program as tolerated; core stabilization; manual work including lumbar mobs if indicated; modalities as indicated (has TENS unit at home)- Progress HEP.    PT Home Exercise Plan [email or web address]    Consulted and Agree with Plan of Care Patient             Patient will benefit from skilled therapeutic intervention in order to improve the following deficits and impairments:  Abnormal gait, Difficulty walking, Decreased activity tolerance, Pain, Hypomobility, Impaired flexibility, Improper body mechanics, Decreased mobility, Decreased strength, Postural dysfunction, Impaired sensation  Visit Diagnosis: Acute bilateral low back pain with bilateral sciatica  Other symptoms and signs involving the musculoskeletal system  Weakness generalized  Stiffness of both hip joints     Problem List Patient Active Problem List   Diagnosis Date Noted   Osteoarthritis    Low back pain    Heart murmur    Exercise-induced asthma    Diabetes mellitus without complication (HCC)    Arthritis    Anemia    Myofascial pain syndrome, cervical 07/28/2019   Lateral epicondylitis of right elbow 07/28/2019   Nontraumatic incomplete tear of right rotator cuff 07/25/2019   Tendinopathy of right biceps tendon 07/25/2019   Erectile dysfunction 07/11/2019   Mild persistent  asthma/cough variant asthma 04/13/2019   Allergic rhinitis with a possible nonallergic component 04/13/2019   Acid reflux 04/13/2019   Chronic cough 04/13/2019   Hoarseness 04/13/2019   Cervical radicular pain 01/10/2019   Ulnar neuropathy at elbow of right upper extremity 01/03/2019   Acute bilateral low back pain without sciatica 01/03/2019   Onychomycosis 05/25/2018   Palpitations 07/06/2017   CAD (coronary artery disease) 07/05/2017   Family history of early CAD 07/05/2017   Abnormal findings on diagnostic imaging of cardiovascular system 06/23/2017   Low vitamin D level 04/22/2017   Pain in right ankle and joints of right foot 10/05/2016   Hemarthrosis of right elbow 02/03/2016   Small thenar eminence 02/03/2016   Cervical disc disorder with radiculopathy of cervical region 10/07/2015   Incomplete rotator cuff tear 09/10/2015   Nonischemic cardiomyopathy ([place] 05/15/2015   Fatigue 05/15/2015   Dyspnea on exertion 05/15/2015   Decreased cardiac ejection fraction 04/02/2015   Strain of latissimus dorsi muscle 01/21/2015   Shoulder bursitis 10/12/2014   Stenosis of lateral recess of lumbar spine 10/12/2014   BPPV (benign paroxysmal positional vertigo) 08/14/2014   Hyperlipidemia 01/19/2014   Anemia, unspecified 01/19/2014   ADD (attention deficit disorder) 01/19/2014   Type 2 diabetes mellitus with hyperglycemia, without long-term current use of insulin ([place] 12/22/2013   Hypogonadism in male 12/22/2013   Arthritis of hand, left 12/22/2013  DIsabelle Course PT  JKerin Perna PTA 03/11/21 11:11 AM  [place] [address] [address] [address]Phone: [phone]  Fax:  [phone] Name: Reginald SHEHADEHMRN: [id number] of Birth: 1Aug 28, 1957

## 2021-03-12 ENCOUNTER — Other Ambulatory Visit (HOSPITAL_BASED_OUTPATIENT_CLINIC_OR_DEPARTMENT_OTHER): Payer: Self-pay

## 2021-03-13 ENCOUNTER — Other Ambulatory Visit (HOSPITAL_BASED_OUTPATIENT_CLINIC_OR_DEPARTMENT_OTHER): Payer: Self-pay

## 2021-03-14 ENCOUNTER — Encounter: Payer: Self-pay | Admitting: Physical Therapy

## 2021-03-14 ENCOUNTER — Ambulatory Visit (INDEPENDENT_AMBULATORY_CARE_PROVIDER_SITE_OTHER): Payer: 59 | Admitting: Physical Therapy

## 2021-03-14 ENCOUNTER — Other Ambulatory Visit: Payer: Self-pay

## 2021-03-14 DIAGNOSIS — M25652 Stiffness of left hip, not elsewhere classified: Secondary | ICD-10-CM

## 2021-03-14 DIAGNOSIS — M5441 Lumbago with sciatica, right side: Secondary | ICD-10-CM

## 2021-03-14 DIAGNOSIS — M5442 Lumbago with sciatica, left side: Secondary | ICD-10-CM | POA: Diagnosis not present

## 2021-03-14 DIAGNOSIS — R29898 Other symptoms and signs involving the musculoskeletal system: Secondary | ICD-10-CM

## 2021-03-14 DIAGNOSIS — R531 Weakness: Secondary | ICD-10-CM

## 2021-03-14 DIAGNOSIS — M25651 Stiffness of right hip, not elsewhere classified: Secondary | ICD-10-CM

## 2021-03-14 NOTE — Therapy (Signed)
[facility] [place] [place], [place], [address] Phone: [phone]   Fax:  [phone]  Physical Therapy Treatment  Patient Details  Name: Reginald Rios MRN: [id number] Date of Birth: Jun 04, 1955 Referring Provider (PT): Dr Lynne Leader   Encounter Date: 03/14/2021   PT End of Session - 03/14/21 1353     Visit Number 6    Number of Visits 12    Date for PT Re-Evaluation 04/09/21    PT Start Time 9476    PT Stop Time 1435    PT Time Calculation (min) 46 min    Activity Tolerance Patient tolerated treatment well    Behavior During Therapy [facility] for tasks assessed/performed             Past Medical History:  Diagnosis Date   ADD (attention deficit disorder)    Anemia    Arthritis    CAD (coronary artery disease)    2/19 PCI/DESx1 to Lcx   Diabetes mellitus without complication ([place])    Exercise-induced asthma    Heart murmur    Hyperlipidemia    family hx of high cholesterol   Hypogonadism in male    Low back pain    Osteoarthritis     Past Surgical History:  Procedure Laterality Date   CORONARY STENT INTERVENTION N/A 06/23/2017   Procedure: CORONARY STENT INTERVENTION;  Surgeon: Leonie Man, MD;  Location: [facility];  Service: Cardiovascular;  Laterality: N/A;   INTRAVASCULAR PRESSURE WIRE/FFR STUDY N/A 06/23/2017   Procedure: INTRAVASCULAR PRESSURE WIRE/FFR STUDY;  Surgeon: Leonie Man, MD;  Location: [facility];  Service: Cardiovascular;  Laterality: N/A;   KNEE ARTHROSCOPY     LEFT HEART CATH AND CORONARY ANGIOGRAPHY N/A 06/23/2017   Procedure: LEFT HEART CATH AND CORONARY ANGIOGRAPHY;  Surgeon: Leonie Man, MD;  Location: [facility];  Service: Cardiovascular;  Laterality: N/A;   SHOULDER ARTHROSCOPY WITH BICEPSTENOTOMY Right 09/06/2019   Procedure: SHOULDER ARTHROSCOPY WITH BICEPSTENOTOMY;  Surgeon: Leandrew Koyanagi, MD;  Location: [facility];   Service: Orthopedics;  Laterality: Right;   SHOULDER ARTHROSCOPY WITH SUBACROMIAL DECOMPRESSION Right 09/06/2019   Procedure: RIGHT SHOULDER ARTHROSCOPY WITH EXTENSIVE DEBRIDEMENT, SUBACROMIAL DECOMPRESSION, BICEPS TENOTOMY;  Surgeon: Leandrew Koyanagi, MD;  Location: [facility];  Service: Orthopedics;  Laterality: Right;   TENOTOMY ACHILLES TENDON      There were no vitals filed for this visit.   Subjective Assessment - 03/14/21 1351     Subjective Pt reports he a lot of relief with DN last session.  He used heat and did some stretches and "It made a world of difference".  He has avoided twisting motions.    Diagnostic tests x rays - FINDINGS:  Mild dextrocurvature. Vertebral body heights are maintained.  Moderate severe degenerative changes at L1-L2 and L2-L3 with  moderate degenerative changes L3 through S1. Facet degenerative  changes of the lower lumbar spine. Aortic atherosclerosis.     IMPRESSION:  Multilevel degenerative changes.  No acute osseous abnormality    Currently in Pain? Yes    Pain Score 5     Pain Location Back    Pain Orientation Left    Pain Descriptors / Indicators Tightness;Aching    Aggravating Factors  forward bending    Pain Relieving Factors TENS, ice/ heat, extension exercises.             Pt seen for aquatic therapy today.  Treatment took  place in water 3.25-4 ft in depth at the [employer] [facility] pool. Temp of water was 94.  Pt entered/exited the pool via stairs independently with bilat rail.  Treatment:   Forward and backward gait with cues for upright posture.  Side stepping with straight knees and bent knees (tolerates bents knees more).  Hip circles (like hula hoop) CW/CCW.   Trunk ext holding onto wall x 5 sec x 10 reps.   Holding onto wall (at 33ft depth): hip abdct x 15, hip ext x 12.   Hip flexion/ knee flexion circles x 5 reps, 2 sets  Pushing floatation dumbbell under water with squat x 5, then squats with no dumbbell x 10  (improved posture and tolerance).   Transverse abd set with hands on yellow noodle, slightly submerging under water x 5 sec x 10, repeated with row with noodle - cues for engaging lower traps, too x 10.  Front float with noodle at chest under armpits x 1 min with lumbar ext, head out of water.  Back supported float with noodle under arms, knees and nekdoodle on neck for decompression of spine x 5 min at end of session.    Pt requires buoyancy for support and to offload joints with strengthening exercises. Viscosity of the water is needed for resistance of strengthening; water current perturbations provides challenge to standing balance unsupported, requiring increased core activation.     PT Long Term Goals - 02/26/21 1201       PT LONG TERM GOAL #1   Title Patient to be independent with advanced HEP.    Time 6    Period Weeks    Status New    Target Date 04/09/21      PT LONG TERM GOAL #2   Title Improve trunk and LE mobility/ROM/tissue extensibility demonstrated by increased lumbar extension to 10% with no pain    Time 6    Period Weeks    Status New    Target Date 04/09/21      PT LONG TERM GOAL #3   Title Patient reports return to normal functional and recreational activities with minimal to no increase in pain > 1-2/10 pain    Time 6    Period Weeks    Status New    Target Date 04/09/21      PT LONG TERM GOAL #4   Title Patient reports return to pickleball at level prior to injury    Time 6    Period Weeks    Status New    Target Date 04/09/21      PT LONG TERM GOAL #5   Title Improve functional limitation score to 49    Time 6    Period Weeks    Status New    Target Date 04/09/21                   Plan - 03/14/21 1440     Clinical Impression Statement Positive response to DN last session, with reported reduction of pain and tightness of Lt low back.  Pt reported reduction of LB pain to 2/10 during session in pool.  Frequent cues for upright posture  and neutral neck position.  Progressing well towards LTGs.    Stability/Clinical Decision Making Stable/Uncomplicated    Rehab Potential Good    PT Frequency 2x / week    PT Duration 6 weeks    PT Treatment/Interventions ADLs/Self Care Home Management;Aquatic Therapy;Cryotherapy;Electrical Stimulation;Iontophoresis 4mg /ml Dexamethasone;Moist Heat;Traction;Ultrasound;Functional mobility training;Therapeutic activities;Therapeutic exercise;Balance training;Neuromuscular  re-education;Patient/family education;Manual techniques;Passive range of motion;Dry needling;Taping    PT Next Visit Plan progress with extension program as tolerated; core stabilization; manual work including lumbar mobs if indicated; modalities as indicated (has TENS unit at home)- Progress HEP.    PT Home Exercise Plan [email or web address]    Consulted and Agree with Plan of Care Patient             Patient will benefit from skilled therapeutic intervention in order to improve the following deficits and impairments:  Abnormal gait, Difficulty walking, Decreased activity tolerance, Pain, Hypomobility, Impaired flexibility, Improper body mechanics, Decreased mobility, Decreased strength, Postural dysfunction, Impaired sensation  Visit Diagnosis: Acute bilateral low back pain with bilateral sciatica  Other symptoms and signs involving the musculoskeletal system  Weakness generalized  Stiffness of both hip joints     Problem List Patient Active Problem List   Diagnosis Date Noted   Osteoarthritis    Low back pain    Heart murmur    Exercise-induced asthma    Diabetes mellitus without complication (HCC)    Arthritis    Anemia    Myofascial pain syndrome, cervical 07/28/2019   Lateral epicondylitis of right elbow 07/28/2019   Nontraumatic incomplete tear of right rotator cuff 07/25/2019   Tendinopathy of right biceps tendon 07/25/2019   Erectile dysfunction 07/11/2019   Mild persistent asthma/cough variant asthma  04/13/2019   Allergic rhinitis with a possible nonallergic component 04/13/2019   Acid reflux 04/13/2019   Chronic cough 04/13/2019   Hoarseness 04/13/2019   Cervical radicular pain 01/10/2019   Ulnar neuropathy at elbow of right upper extremity 01/03/2019   Acute bilateral low back pain without sciatica 01/03/2019   Onychomycosis 05/25/2018   Palpitations 07/06/2017   CAD (coronary artery disease) 07/05/2017   Family history of early CAD 07/05/2017   Abnormal findings on diagnostic imaging of cardiovascular system 06/23/2017   Low vitamin D level 04/22/2017   Pain in right ankle and joints of right foot 10/05/2016   Hemarthrosis of right elbow 02/03/2016   Small thenar eminence 02/03/2016   Cervical disc disorder with radiculopathy of cervical region 10/07/2015   Incomplete rotator cuff tear 09/10/2015   Nonischemic cardiomyopathy ([place]) 05/15/2015   Fatigue 05/15/2015   Dyspnea on exertion 05/15/2015   Decreased cardiac ejection fraction 04/02/2015   Strain of latissimus dorsi muscle 01/21/2015   Shoulder bursitis 10/12/2014   Stenosis of lateral recess of lumbar spine 10/12/2014   BPPV (benign paroxysmal positional vertigo) 08/14/2014   Hyperlipidemia 01/19/2014   Anemia, unspecified 01/19/2014   ADD (attention deficit disorder) 01/19/2014   Type 2 diabetes mellitus with hyperglycemia, without long-term current use of insulin ([place]) 12/22/2013   Hypogonadism in male 12/22/2013   Arthritis of hand, left 12/22/2013   Kerin Perna, PTA 03/14/21 2:42 PM  [facility] [place] [address] [place], [place], [address] Phone: [phone]   Fax:  [phone]  Name: Reginald Rios MRN: [id number] Date of Birth: 1956/01/24

## 2021-03-18 ENCOUNTER — Encounter: Payer: Self-pay | Admitting: Rehabilitative and Restorative Service Providers"

## 2021-03-18 ENCOUNTER — Ambulatory Visit (INDEPENDENT_AMBULATORY_CARE_PROVIDER_SITE_OTHER): Payer: 59 | Admitting: Rehabilitative and Restorative Service Providers"

## 2021-03-18 ENCOUNTER — Other Ambulatory Visit: Payer: Self-pay

## 2021-03-18 ENCOUNTER — Other Ambulatory Visit (HOSPITAL_BASED_OUTPATIENT_CLINIC_OR_DEPARTMENT_OTHER): Payer: Self-pay

## 2021-03-18 DIAGNOSIS — M5442 Lumbago with sciatica, left side: Secondary | ICD-10-CM | POA: Diagnosis not present

## 2021-03-18 DIAGNOSIS — M25651 Stiffness of right hip, not elsewhere classified: Secondary | ICD-10-CM

## 2021-03-18 DIAGNOSIS — M25652 Stiffness of left hip, not elsewhere classified: Secondary | ICD-10-CM | POA: Diagnosis not present

## 2021-03-18 DIAGNOSIS — R29898 Other symptoms and signs involving the musculoskeletal system: Secondary | ICD-10-CM | POA: Diagnosis not present

## 2021-03-18 DIAGNOSIS — R531 Weakness: Secondary | ICD-10-CM | POA: Diagnosis not present

## 2021-03-18 DIAGNOSIS — M5441 Lumbago with sciatica, right side: Secondary | ICD-10-CM | POA: Diagnosis not present

## 2021-03-18 NOTE — Patient Instructions (Signed)
[facility] Exercise Resources   Community Programs Location and contact info description of services cost  [employer] [facility] [address] [place], [place] [phone]  Aquatics, silver sneakers classes, yoga, exercise equipment $18/month for 55+ $25/month individual $40-$60/month family  [facility] [address] [place], [place] [phone] Transportation to appointments. Activities (stretching, dancing, Tai Chi)  Mostly free  [facility] [address] [place], [place] [phone]  [employer] [facility] [employer] Fee based  [employer] exercise program Various locations [phone]  [place] exercise free  [place] [facility] Various locations [phone] Yoga, Tai Chi, Step Aerobics, Bocce, Table tennis, chair volleyball, [place]  free  [facility] [address] [place], [place]  [phone] Various activities ([place] exercise, yoga, line dancing, etc)  free    

## 2021-03-18 NOTE — Therapy (Signed)
[facility] [place], [place], [address] Phone: [phone]   Fax:  [phone]  Physical Therapy Treatment  Patient Details  Name: Reginald Rios MRN: [id number] Date of Birth: Nov 04, 1955 Referring Provider (PT): Dr Lynne Leader   Encounter Date: 03/18/2021   PT End of Session - 03/18/21 0933     Visit Number 7    Number of Visits 12    Date for PT Re-Evaluation 04/09/21    PT Start Time 0932    PT Stop Time 1020    PT Time Calculation (min) 48 min    Activity Tolerance Patient tolerated treatment well             Past Medical History:  Diagnosis Date   ADD (attention deficit disorder)    Anemia    Arthritis    CAD (coronary artery disease)    2/19 PCI/DESx1 to Lcx   Diabetes mellitus without complication ([place])    Exercise-induced asthma    Heart murmur    Hyperlipidemia    family hx of high cholesterol   Hypogonadism in male    Low back pain    Osteoarthritis     Past Surgical History:  Procedure Laterality Date   CORONARY STENT INTERVENTION N/A 06/23/2017   Procedure: CORONARY STENT INTERVENTION;  Surgeon: Leonie Man, MD;  Location: [facility];  Service: Cardiovascular;  Laterality: N/A;   INTRAVASCULAR PRESSURE WIRE/FFR STUDY N/A 06/23/2017   Procedure: INTRAVASCULAR PRESSURE WIRE/FFR STUDY;  Surgeon: Leonie Man, MD;  Location: [facility];  Service: Cardiovascular;  Laterality: N/A;   KNEE ARTHROSCOPY     LEFT HEART CATH AND CORONARY ANGIOGRAPHY N/A 06/23/2017   Procedure: LEFT HEART CATH AND CORONARY ANGIOGRAPHY;  Surgeon: Leonie Man, MD;  Location: [facility];  Service: Cardiovascular;  Laterality: N/A;   SHOULDER ARTHROSCOPY WITH BICEPSTENOTOMY Right 09/06/2019   Procedure: SHOULDER ARTHROSCOPY WITH BICEPSTENOTOMY;  Surgeon: Leandrew Koyanagi, MD;  Location: [facility];  Service: Orthopedics;  Laterality: Right;   SHOULDER ARTHROSCOPY  WITH SUBACROMIAL DECOMPRESSION Right 09/06/2019   Procedure: RIGHT SHOULDER ARTHROSCOPY WITH EXTENSIVE DEBRIDEMENT, SUBACROMIAL DECOMPRESSION, BICEPS TENOTOMY;  Surgeon: Leandrew Koyanagi, MD;  Location: [facility];  Service: Orthopedics;  Laterality: Right;   TENOTOMY ACHILLES TENDON      There were no vitals filed for this visit.   Subjective Assessment - 03/18/21 0933     Subjective Water was great. Really relaxed the back. Just didn't last. Considering joining somewhere to have access to a pool. DN has made a difference. Pain is now more in the center. Felt better this morning with hot shower but LBP has increased some now.    Currently in Pain? Yes    Pain Score 7     Pain Location Back    Pain Orientation Left;Mid    Pain Descriptors / Indicators Burning;Tightness    Pain Type Acute pain;Chronic pain    Pain Radiating Towards no longer going to knees    Pain Onset More than a month ago    Pain Frequency Constant                [facility] PT Assessment - 03/18/21 0001       Assessment   Medical Diagnosis LBP with Lt > Rt LE radicular pain    Referring Provider (PT) Dr Lynne Leader    Onset Date/Surgical Date 02/05/21    Hand Dominance Right  Next MD Visit 04/02/21    Prior Therapy here for back; neck; LE's      AROM   Lumbar Flexion 65% stiffness    Lumbar Extension 5% no increase in pain    Lumbar - Right Side Bend 60% mild pain in the center LB    Lumbar - Left Side Bend 60% no pain    Lumbar - Right Rotation 75%    Lumbar - Left Rotation 75% pain in the Lt LB      Flexibility   Piriformis tight Rt > Lt      Palpation   Palpation comment muscular tightness bilat lumbar paraspinals; Rt > Lt posterior hip - pirifromis and gluts                           [facility] Adult PT Treatment/Exercise - 03/18/21 0001       Lumbar Exercises: Stretches   Standing Extension 5 seconds;5 reps    Quad Stretch Right;Left;1 rep;30 seconds    Piriformis  Stretch Left;30 seconds;2 reps;Right   seated pulling knee to opp shoulder/modified travell     Lumbar Exercises: Aerobic   Nustep L5: arms/legs x 6 min for warm up ( pt moving around 45-58 spm)      Moist Heat Therapy   Number Minutes Moist Heat 10 Minutes    Moist Heat Location Lumbar Spine;Hip      Manual Therapy   Manual therapy comments skilled palpation to assess response to DN and manual work    Soft tissue mobilization deep tissue work through the lumbar paraspinals and bilat posterior hips    Myofascial Release lumbar paraspinals    Other Manual Therapy lumbar PA mobs Grade II/III              Trigger Point Dry Needling - 03/18/21 0001     Consent Given? Yes    Education Handout Provided Previously provided    Printmaker Performed with Dry Needling Yes    Gluteus Medius Response Palpable increased muscle length    Gluteus Maximus Response Palpable increased muscle length    Piriformis Response Palpable increased muscle length    Erector spinae Response Palpable increased muscle length    Lumbar multifidi Response Palpable increased muscle length                   PT Education - 03/18/21 0941     Education Details community resources    [employer]) Educated Patient    Methods Explanation;Handout    Comprehension Verbalized understanding                 PT Long Term Goals - 02/26/21 1201       PT LONG TERM GOAL #1   Title Patient to be independent with advanced HEP.    Time 6    Period Weeks    Status New    Target Date 04/09/21      PT LONG TERM GOAL #2   Title Improve trunk and LE mobility/ROM/tissue extensibility demonstrated by increased lumbar extension to 10% with no pain    Time 6    Period Weeks    Status New    Target Date 04/09/21      PT LONG TERM GOAL #3   Title Patient reports return to normal functional and recreational activities with minimal to no increase in pain > 1-2/10 pain    Time 6    Period Weeks  Status New    Target Date 04/09/21      PT LONG TERM GOAL #4   Title Patient reports return to pickleball at level prior to injury    Time 6    Period Weeks    Status New    Target Date 04/09/21      PT LONG TERM GOAL #5   Title Improve functional limitation score to 49    Time 6    Period Weeks    Status New    Target Date 04/09/21                   Plan - 03/18/21 0940     Clinical Impression Statement Good response to aquatic therapy and DN. Pain no longer radicular in nature, now central. Patient has some increased mobility with decreased pain with lumbar ROM. He has persistent pain and tightness with palpation in the lumbar paraspinals and posterior hips Rt > Lt. Good response to DN and manual work.    Rehab Potential Good    PT Frequency 2x / week    PT Duration 6 weeks    PT Treatment/Interventions ADLs/Self Care Home Management;Aquatic Therapy;Cryotherapy;Electrical Stimulation;Iontophoresis 4mg /ml Dexamethasone;Moist Heat;Traction;Ultrasound;Functional mobility training;Therapeutic activities;Therapeutic exercise;Balance training;Neuromuscular re-education;Patient/family education;Manual techniques;Passive range of motion;Dry needling;Taping    PT Next Visit Plan progress with extension program as tolerated; core stabilization; manual work including lumbar mobs if indicated; modalities as indicated (has TENS unit at home)- Progress HEP.    PT Home Exercise Plan [email or web address]    Consulted and Agree with Plan of Care Patient             Patient will benefit from skilled therapeutic intervention in order to improve the following deficits and impairments:     Visit Diagnosis: Acute bilateral low back pain with bilateral sciatica  Other symptoms and signs involving the musculoskeletal system  Weakness generalized  Stiffness of both hip joints     Problem List Patient Active Problem List   Diagnosis Date Noted   Osteoarthritis    Low back pain    Heart  murmur    Exercise-induced asthma    Diabetes mellitus without complication (HCC)    Arthritis    Anemia    Myofascial pain syndrome, cervical 07/28/2019   Lateral epicondylitis of right elbow 07/28/2019   Nontraumatic incomplete tear of right rotator cuff 07/25/2019   Tendinopathy of right biceps tendon 07/25/2019   Erectile dysfunction 07/11/2019   Mild persistent asthma/cough variant asthma 04/13/2019   Allergic rhinitis with a possible nonallergic component 04/13/2019   Acid reflux 04/13/2019   Chronic cough 04/13/2019   Hoarseness 04/13/2019   Cervical radicular pain 01/10/2019   Ulnar neuropathy at elbow of right upper extremity 01/03/2019   Acute bilateral low back pain without sciatica 01/03/2019   Onychomycosis 05/25/2018   Palpitations 07/06/2017   CAD (coronary artery disease) 07/05/2017   Family history of early CAD 07/05/2017   Abnormal findings on diagnostic imaging of cardiovascular system 06/23/2017   Low vitamin D level 04/22/2017   Pain in right ankle and joints of right foot 10/05/2016   Hemarthrosis of right elbow 02/03/2016   Small thenar eminence 02/03/2016   Cervical disc disorder with radiculopathy of cervical region 10/07/2015   Incomplete rotator cuff tear 09/10/2015   Nonischemic cardiomyopathy ([place]) 05/15/2015   Fatigue 05/15/2015   Dyspnea on exertion 05/15/2015   Decreased cardiac ejection fraction 04/02/2015   Strain of latissimus dorsi muscle 01/21/2015   Shoulder bursitis 10/12/2014  Stenosis of lateral recess of lumbar spine 10/12/2014   BPPV (benign paroxysmal positional vertigo) 08/14/2014   Hyperlipidemia 01/19/2014   Anemia, unspecified 01/19/2014   ADD (attention deficit disorder) 01/19/2014   Type 2 diabetes mellitus with hyperglycemia, without long-term current use of insulin ([place]) 12/22/2013   Hypogonadism in male 12/22/2013   Arthritis of hand, left 12/22/2013    Porsha Skilton Nilda Simmer, PT, MPH  03/18/2021, 10:13 AM  [facility] [place], [place], [address] Phone: [phone]   Fax:  [phone]  Name: DARIAN CANSLER MRN: [id number] Date of Birth: 12-Jan-1956

## 2021-03-19 ENCOUNTER — Other Ambulatory Visit (HOSPITAL_BASED_OUTPATIENT_CLINIC_OR_DEPARTMENT_OTHER): Payer: Self-pay

## 2021-03-21 ENCOUNTER — Other Ambulatory Visit: Payer: Self-pay

## 2021-03-21 ENCOUNTER — Ambulatory Visit (INDEPENDENT_AMBULATORY_CARE_PROVIDER_SITE_OTHER): Payer: 59 | Admitting: Physical Therapy

## 2021-03-21 DIAGNOSIS — M5441 Lumbago with sciatica, right side: Secondary | ICD-10-CM

## 2021-03-21 DIAGNOSIS — M25651 Stiffness of right hip, not elsewhere classified: Secondary | ICD-10-CM | POA: Diagnosis not present

## 2021-03-21 DIAGNOSIS — R531 Weakness: Secondary | ICD-10-CM

## 2021-03-21 DIAGNOSIS — M5442 Lumbago with sciatica, left side: Secondary | ICD-10-CM

## 2021-03-21 DIAGNOSIS — R29898 Other symptoms and signs involving the musculoskeletal system: Secondary | ICD-10-CM

## 2021-03-21 DIAGNOSIS — M25652 Stiffness of left hip, not elsewhere classified: Secondary | ICD-10-CM

## 2021-03-21 NOTE — Therapy (Signed)
[facility] [place] [place], [place], [address] Phone: [phone]   Fax:  [phone]  Physical Therapy Treatment  Patient Details  Name: Reginald Rios MRN: [id number] Date of Birth: 02/13/1956 Referring Provider (PT): Dr Lynne Leader   Encounter Date: 03/21/2021   PT End of Session - 03/21/21 1326     Visit Number 8    Number of Visits 12    Date for PT Re-Evaluation 04/09/21    PT Start Time 1319    PT Stop Time 1400    PT Time Calculation (min) 41 min    Activity Tolerance Patient tolerated treatment well    Behavior During Therapy [facility] for tasks assessed/performed             Past Medical History:  Diagnosis Date   ADD (attention deficit disorder)    Anemia    Arthritis    CAD (coronary artery disease)    2/19 PCI/DESx1 to Lcx   Diabetes mellitus without complication ([place])    Exercise-induced asthma    Heart murmur    Hyperlipidemia    family hx of high cholesterol   Hypogonadism in male    Low back pain    Osteoarthritis     Past Surgical History:  Procedure Laterality Date   CORONARY STENT INTERVENTION N/A 06/23/2017   Procedure: CORONARY STENT INTERVENTION;  Surgeon: Leonie Man, MD;  Location: [facility];  Service: Cardiovascular;  Laterality: N/A;   INTRAVASCULAR PRESSURE WIRE/FFR STUDY N/A 06/23/2017   Procedure: INTRAVASCULAR PRESSURE WIRE/FFR STUDY;  Surgeon: Leonie Man, MD;  Location: [facility];  Service: Cardiovascular;  Laterality: N/A;   KNEE ARTHROSCOPY     LEFT HEART CATH AND CORONARY ANGIOGRAPHY N/A 06/23/2017   Procedure: LEFT HEART CATH AND CORONARY ANGIOGRAPHY;  Surgeon: Leonie Man, MD;  Location: [facility];  Service: Cardiovascular;  Laterality: N/A;   SHOULDER ARTHROSCOPY WITH BICEPSTENOTOMY Right 09/06/2019   Procedure: SHOULDER ARTHROSCOPY WITH BICEPSTENOTOMY;  Surgeon: Leandrew Koyanagi, MD;  Location: [facility];   Service: Orthopedics;  Laterality: Right;   SHOULDER ARTHROSCOPY WITH SUBACROMIAL DECOMPRESSION Right 09/06/2019   Procedure: RIGHT SHOULDER ARTHROSCOPY WITH EXTENSIVE DEBRIDEMENT, SUBACROMIAL DECOMPRESSION, BICEPS TENOTOMY;  Surgeon: Leandrew Koyanagi, MD;  Location: [facility];  Service: Orthopedics;  Laterality: Right;   TENOTOMY ACHILLES TENDON      There were no vitals filed for this visit.   Subjective Assessment - 03/21/21 1326     Subjective Overall back pain is improving.  He had a 20 min drive to session and back pain increased.  DN has helped.  He'd like to know when he can begin twisting activities again.    Pain Score 5     Pain Location Back    Pain Orientation Lower    Pain Descriptors / Indicators Aching;Tightness    Aggravating Factors  forward bending, twisting    Pain Relieving Factors TENS, ice/heat, extension exercises.            Treatment:    Forward and backward gait with cues for upright posture.    Back supported float with noodle under arms, knees and nekdoodle on neck for decompression of spine x 5 min at end of session.  Trunk ext holding onto wall x 5 sec x 10 reps.   Holding onto wall (at 83ft depth): hip abdct x 10, hip ext x 10.   Hip flexion/ knee flexion circles  x 5 reps, 2 sets  Side stepping with bent knees. Hip circles (like hula hoop) CW/CCW.    squats with arms forward x 10.    Transverse abd set with hands on yellow noodle, slightly submerging under water x 5 sec x 10, repeated with row with noodle - cues for engaging lower traps, too x 10.    Holding yellow noodle:  small curtsy x 10 each leg.  Knee to noodle (hip abdct/knee flexion) x 10 each leg, then knee to noodle (hip add/ knee flexion, cues to avoid twisting spine) x 10.  Back lunge to knee to noodle x 10 each leg  - pt reported relief with these exercises.    Repeated forward/ back gait with cues for upward posture.       Pt requires buoyancy for support and to  offload joints with strengthening exercises. Viscosity of the water is needed for resistance of strengthening; water current perturbations provides challenge to standing balance unsupported, requiring increased core activation.       PT Long Term Goals - 03/21/21 1351       PT LONG TERM GOAL #1   Title Patient to be independent with advanced HEP.    Time 6    Period Weeks    Status On-going      PT LONG TERM GOAL #2   Title Improve trunk and LE mobility/ROM/tissue extensibility demonstrated by increased lumbar extension to 10% with no pain    Time 6    Period Weeks    Status On-going      PT LONG TERM GOAL #3   Title Patient reports return to normal functional and recreational activities with minimal to no increase in pain > 1-2/10 pain    Time 6    Period Weeks    Status On-going      PT LONG TERM GOAL #4   Title Patient reports return to pickleball at level prior to injury    Time 6    Period Weeks    Status On-going      PT LONG TERM GOAL #5   Title Improve functional limitation score to 49    Time 6    Period Weeks    Status On-going                   Plan - 03/21/21 1345     Clinical Impression Statement Pt reported reduction of LBP to 3/10 during aquatic exercises.  He reported reduction of tightness in hips.  cues to avoid twisting spine and mobilize hips/ knees instead.  Progressing well towards goals.    Rehab Potential Good    PT Frequency 2x / week    PT Duration 6 weeks    PT Treatment/Interventions ADLs/Self Care Home Management;Aquatic Therapy;Cryotherapy;Electrical Stimulation;Iontophoresis 4mg /ml Dexamethasone;Moist Heat;Traction;Ultrasound;Functional mobility training;Therapeutic activities;Therapeutic exercise;Balance training;Neuromuscular re-education;Patient/family education;Manual techniques;Passive range of motion;Dry needling;Taping    PT Next Visit Plan progress with extension program as tolerated; core stabilization; manual work  including lumbar mobs if indicated; modalities as indicated (has TENS unit at home)- Progress HEP.    PT Home Exercise Plan [email or web address]    Consulted and Agree with Plan of Care Patient             Patient will benefit from skilled therapeutic intervention in order to improve the following deficits and impairments:  Abnormal gait, Difficulty walking, Decreased activity tolerance, Pain, Hypomobility, Impaired flexibility, Improper body mechanics, Decreased mobility, Decreased strength, Postural dysfunction, Impaired sensation  Visit Diagnosis:  Acute bilateral low back pain with bilateral sciatica  Other symptoms and signs involving the musculoskeletal system  Weakness generalized  Stiffness of both hip joints     Problem List Patient Active Problem List   Diagnosis Date Noted   Osteoarthritis    Low back pain    Heart murmur    Exercise-induced asthma    Diabetes mellitus without complication (HCC)    Arthritis    Anemia    Myofascial pain syndrome, cervical 07/28/2019   Lateral epicondylitis of right elbow 07/28/2019   Nontraumatic incomplete tear of right rotator cuff 07/25/2019   Tendinopathy of right biceps tendon 07/25/2019   Erectile dysfunction 07/11/2019   Mild persistent asthma/cough variant asthma 04/13/2019   Allergic rhinitis with a possible nonallergic component 04/13/2019   Acid reflux 04/13/2019   Chronic cough 04/13/2019   Hoarseness 04/13/2019   Cervical radicular pain 01/10/2019   Ulnar neuropathy at elbow of right upper extremity 01/03/2019   Acute bilateral low back pain without sciatica 01/03/2019   Onychomycosis 05/25/2018   Palpitations 07/06/2017   CAD (coronary artery disease) 07/05/2017   Family history of early CAD 07/05/2017   Abnormal findings on diagnostic imaging of cardiovascular system 06/23/2017   Low vitamin D level 04/22/2017   Pain in right ankle and joints of right foot 10/05/2016   Hemarthrosis of right elbow 02/03/2016   Small  thenar eminence 02/03/2016   Cervical disc disorder with radiculopathy of cervical region 10/07/2015   Incomplete rotator cuff tear 09/10/2015   Nonischemic cardiomyopathy ([place]) 05/15/2015   Fatigue 05/15/2015   Dyspnea on exertion 05/15/2015   Decreased cardiac ejection fraction 04/02/2015   Strain of latissimus dorsi muscle 01/21/2015   Shoulder bursitis 10/12/2014   Stenosis of lateral recess of lumbar spine 10/12/2014   BPPV (benign paroxysmal positional vertigo) 08/14/2014   Hyperlipidemia 01/19/2014   Anemia, unspecified 01/19/2014   ADD (attention deficit disorder) 01/19/2014   Type 2 diabetes mellitus with hyperglycemia, without long-term current use of insulin ([place]) 12/22/2013   Hypogonadism in male 12/22/2013   Arthritis of hand, left 12/22/2013   Kerin Perna, PTA 03/21/21 1:54 PM   [facility] [place], [place], [address] Phone: [phone]   Fax:  [phone]  Name: CULLAN LAUNER MRN: [id number] Date of Birth: 09/20/1955

## 2021-03-24 ENCOUNTER — Other Ambulatory Visit (HOSPITAL_BASED_OUTPATIENT_CLINIC_OR_DEPARTMENT_OTHER): Payer: Self-pay

## 2021-03-24 ENCOUNTER — Encounter: Payer: Self-pay | Admitting: Rehabilitative and Restorative Service Providers"

## 2021-03-24 ENCOUNTER — Ambulatory Visit (INDEPENDENT_AMBULATORY_CARE_PROVIDER_SITE_OTHER): Payer: 59 | Admitting: Rehabilitative and Restorative Service Providers"

## 2021-03-24 ENCOUNTER — Other Ambulatory Visit: Payer: Self-pay

## 2021-03-24 DIAGNOSIS — M5442 Lumbago with sciatica, left side: Secondary | ICD-10-CM | POA: Diagnosis not present

## 2021-03-24 DIAGNOSIS — M5441 Lumbago with sciatica, right side: Secondary | ICD-10-CM

## 2021-03-24 DIAGNOSIS — R531 Weakness: Secondary | ICD-10-CM

## 2021-03-24 DIAGNOSIS — M25651 Stiffness of right hip, not elsewhere classified: Secondary | ICD-10-CM

## 2021-03-24 DIAGNOSIS — R29898 Other symptoms and signs involving the musculoskeletal system: Secondary | ICD-10-CM | POA: Diagnosis not present

## 2021-03-24 DIAGNOSIS — M25652 Stiffness of left hip, not elsewhere classified: Secondary | ICD-10-CM | POA: Diagnosis not present

## 2021-03-24 NOTE — Therapy (Signed)
[facility] [place], [place], [address] Phone: [phone]   Fax:  [phone]  Physical Therapy Treatment  Patient Details  Name: Reginald Rios MRN: [id number] Date of Birth: 10/25/1955 Referring Provider (PT): Dr Lynne Leader   Encounter Date: 03/24/2021   PT End of Session - 03/24/21 1521     Visit Number 9    Number of Visits 12    Date for PT Re-Evaluation 04/09/21    PT Start Time 1418    PT Stop Time 1500    PT Time Calculation (min) 42 min    Activity Tolerance Patient tolerated treatment well             Past Medical History:  Diagnosis Date   ADD (attention deficit disorder)    Anemia    Arthritis    CAD (coronary artery disease)    2/19 PCI/DESx1 to Lcx   Diabetes mellitus without complication ([place])    Exercise-induced asthma    Heart murmur    Hyperlipidemia    family hx of high cholesterol   Hypogonadism in male    Low back pain    Osteoarthritis     Past Surgical History:  Procedure Laterality Date   CORONARY STENT INTERVENTION N/A 06/23/2017   Procedure: CORONARY STENT INTERVENTION;  Surgeon: Leonie Man, MD;  Location: [facility];  Service: Cardiovascular;  Laterality: N/A;   INTRAVASCULAR PRESSURE WIRE/FFR STUDY N/A 06/23/2017   Procedure: INTRAVASCULAR PRESSURE WIRE/FFR STUDY;  Surgeon: Leonie Man, MD;  Location: [facility];  Service: Cardiovascular;  Laterality: N/A;   KNEE ARTHROSCOPY     LEFT HEART CATH AND CORONARY ANGIOGRAPHY N/A 06/23/2017   Procedure: LEFT HEART CATH AND CORONARY ANGIOGRAPHY;  Surgeon: Leonie Man, MD;  Location: [facility];  Service: Cardiovascular;  Laterality: N/A;   SHOULDER ARTHROSCOPY WITH BICEPSTENOTOMY Right 09/06/2019   Procedure: SHOULDER ARTHROSCOPY WITH BICEPSTENOTOMY;  Surgeon: Leandrew Koyanagi, MD;  Location: [facility];  Service: Orthopedics;  Laterality: Right;   SHOULDER ARTHROSCOPY  WITH SUBACROMIAL DECOMPRESSION Right 09/06/2019   Procedure: RIGHT SHOULDER ARTHROSCOPY WITH EXTENSIVE DEBRIDEMENT, SUBACROMIAL DECOMPRESSION, BICEPS TENOTOMY;  Surgeon: Leandrew Koyanagi, MD;  Location: [facility];  Service: Orthopedics;  Laterality: Right;   TENOTOMY ACHILLES TENDON      There were no vitals filed for this visit.   Subjective Assessment - 03/24/21 1521     Subjective Back is feeling a lot better overall. Even worked in the yard some yesterday and did not have any increase in pain, just some soreness. Awoke with pain at 1 or 2/10. Did some exercises and the pain went up. Put the noodle behind his back while driving - across the small of the back and it started hurting. Can get rid of pain for a period of time now. Encouraged by progress. Pool feels good. DN helps. He has started on a new medicine and is having trouble with his blood sugar level.    Currently in Pain? Yes    Pain Score 6     Pain Location Back    Pain Orientation Lower    Pain Descriptors / Indicators Aching;Tightness    Pain Type Acute pain;Chronic pain    Pain Onset More than a month ago    Pain Frequency Intermittent                [facility] PT Assessment - 03/24/21 0001  Assessment   Medical Diagnosis LBP with Lt > Rt LE radicular pain    Referring Provider (PT) Dr Lynne Leader    Onset Date/Surgical Date 02/05/21    Hand Dominance Right    Next MD Visit 04/02/21    Prior Therapy here for back; neck; LE's      Flexibility   Piriformis tight Rt > Lt      Palpation   Palpation comment muscular tightness bilat lumbar paraspinals; Rt > Lt: Lt posterior hip - pirifromis and gluts                           [facility] Adult PT Treatment/Exercise - 03/24/21 0001       Lumbar Exercises: Stretches   Standing Extension 5 seconds;5 reps    Quad Stretch Right;Left;1 rep;30 seconds      Lumbar Exercises: Aerobic   Nustep L6 x 6.5 minutes      Knee/Hip Exercises:  Standing   Other Standing Knee Exercises antirotation green TB x 10 each side requiring continued VC(not issued for HEP)      Moist Heat Therapy   Number Minutes Moist Heat 10 Minutes    Moist Heat Location Lumbar Spine;Hip      Electrical Stimulation   Electrical Stimulation Location Lt LB    Electrical Stimulation Action mAmp x 5 min; microcurrent x 5 min    Electrical Stimulation Parameters to tolerance    Electrical Stimulation Goals Pain;Tone      Manual Therapy   Manual therapy comments skilled palpation to assess response to DN and manual work    Soft tissue mobilization deep tissue work through the lumbar paraspinals and bilat posterior hips    Myofascial Release lumbar paraspinals    Other Manual Therapy lumbar PA; posterior Greater Trochanter PA  mobs Grade II/III              Trigger Point Dry Needling - 03/24/21 0001     Consent Given? Yes    Education Handout Provided Previously provided    Printmaker Performed with Dry Needling Yes    Gluteus Medius Response Palpable increased muscle length    Gluteus Maximus Response Palpable increased muscle length    Piriformis Response Palpable increased muscle length    Erector spinae Response Palpable increased muscle length    Lumbar multifidi Response Palpable increased muscle length                        PT Long Term Goals - 03/21/21 1351       PT LONG TERM GOAL #1   Title Patient to be independent with advanced HEP.    Time 6    Period Weeks    Status On-going      PT LONG TERM GOAL #2   Title Improve trunk and LE mobility/ROM/tissue extensibility demonstrated by increased lumbar extension to 10% with no pain    Time 6    Period Weeks    Status On-going      PT LONG TERM GOAL #3   Title Patient reports return to normal functional and recreational activities with minimal to no increase in pain > 1-2/10 pain    Time 6    Period Weeks    Status On-going      PT LONG TERM GOAL  #4   Title Patient reports return to pickleball at level prior to injury    Time 6  Period Weeks    Status On-going      PT LONG TERM GOAL #5   Title Improve functional limitation score to 49    Time 6    Period Weeks    Status On-going                   Plan - 03/24/21 1544     Clinical Impression Statement Continued gradual improvement with intermittent pain in the LB now (was constant pain). Patient has increased pain upon entering the clinic likely related to driving with the swim noodle across lumbar spine. Suggested he try the noodle vertically along the spine. Patient is gradually progressing toward goals of therapy.    Rehab Potential Good    PT Frequency 2x / week    PT Duration 6 weeks    PT Treatment/Interventions ADLs/Self Care Home Management;Aquatic Therapy;Cryotherapy;Electrical Stimulation;Iontophoresis 4mg /ml Dexamethasone;Moist Heat;Traction;Ultrasound;Functional mobility training;Therapeutic activities;Therapeutic exercise;Balance training;Neuromuscular re-education;Patient/family education;Manual techniques;Passive range of motion;Dry needling;Taping    PT Next Visit Plan progress with extension program as tolerated; core stabilization; manual work including lumbar mobs if indicated; modalities as indicated (has TENS unit at home)- Progress HEP trial of antirotation exercise needs continued practice before progressing to [place] and Agree with Plan of Care Patient             Patient will benefit from skilled therapeutic intervention in order to improve the following deficits and impairments:     Visit Diagnosis: Acute bilateral low back pain with bilateral sciatica  Other symptoms and signs involving the musculoskeletal system  Weakness generalized  Stiffness of both hip joints     Problem List Patient Active Problem List   Diagnosis Date Noted   Osteoarthritis    Low back pain    Heart murmur     Exercise-induced asthma    Diabetes mellitus without complication (HCC)    Arthritis    Anemia    Myofascial pain syndrome, cervical 07/28/2019   Lateral epicondylitis of right elbow 07/28/2019   Nontraumatic incomplete tear of right rotator cuff 07/25/2019   Tendinopathy of right biceps tendon 07/25/2019   Erectile dysfunction 07/11/2019   Mild persistent asthma/cough variant asthma 04/13/2019   Allergic rhinitis with a possible nonallergic component 04/13/2019   Acid reflux 04/13/2019   Chronic cough 04/13/2019   Hoarseness 04/13/2019   Cervical radicular pain 01/10/2019   Ulnar neuropathy at elbow of right upper extremity 01/03/2019   Acute bilateral low back pain without sciatica 01/03/2019   Onychomycosis 05/25/2018   Palpitations 07/06/2017   CAD (coronary artery disease) 07/05/2017   Family history of early CAD 07/05/2017   Abnormal findings on diagnostic imaging of cardiovascular system 06/23/2017   Low vitamin D level 04/22/2017   Pain in right ankle and joints of right foot 10/05/2016   Hemarthrosis of right elbow 02/03/2016   Small thenar eminence 02/03/2016   Cervical disc disorder with radiculopathy of cervical region 10/07/2015   Incomplete rotator cuff tear 09/10/2015   Nonischemic cardiomyopathy ([place]) 05/15/2015   Fatigue 05/15/2015   Dyspnea on exertion 05/15/2015   Decreased cardiac ejection fraction 04/02/2015   Strain of latissimus dorsi muscle 01/21/2015   Shoulder bursitis 10/12/2014   Stenosis of lateral recess of lumbar spine 10/12/2014   BPPV (benign paroxysmal positional vertigo) 08/14/2014   Hyperlipidemia 01/19/2014   Anemia, unspecified 01/19/2014   ADD (attention deficit disorder) 01/19/2014   Type 2 diabetes mellitus with hyperglycemia, without  long-term current use of insulin ([place]) 12/22/2013   Hypogonadism in male 12/22/2013   Arthritis of hand, left 12/22/2013    Everardo All, PT, MPH  03/24/2021, 4:02 PM  [facility] [place], [place], [address] Phone: [phone]   Fax:  [phone]  Name: TERRILL ALPERIN MRN: [id number] Date of Birth: 1955/11/08

## 2021-03-31 ENCOUNTER — Ambulatory Visit (INDEPENDENT_AMBULATORY_CARE_PROVIDER_SITE_OTHER): Payer: 59 | Admitting: Rehabilitative and Restorative Service Providers"

## 2021-03-31 ENCOUNTER — Encounter: Payer: Self-pay | Admitting: Rehabilitative and Restorative Service Providers"

## 2021-03-31 ENCOUNTER — Other Ambulatory Visit: Payer: Self-pay

## 2021-03-31 DIAGNOSIS — M5441 Lumbago with sciatica, right side: Secondary | ICD-10-CM

## 2021-03-31 DIAGNOSIS — R531 Weakness: Secondary | ICD-10-CM

## 2021-03-31 DIAGNOSIS — R29898 Other symptoms and signs involving the musculoskeletal system: Secondary | ICD-10-CM | POA: Diagnosis not present

## 2021-03-31 DIAGNOSIS — S76312A Strain of muscle, fascia and tendon of the posterior muscle group at thigh level, left thigh, initial encounter: Secondary | ICD-10-CM | POA: Diagnosis not present

## 2021-03-31 DIAGNOSIS — M5442 Lumbago with sciatica, left side: Secondary | ICD-10-CM | POA: Diagnosis not present

## 2021-03-31 DIAGNOSIS — M25651 Stiffness of right hip, not elsewhere classified: Secondary | ICD-10-CM

## 2021-03-31 DIAGNOSIS — M25652 Stiffness of left hip, not elsewhere classified: Secondary | ICD-10-CM

## 2021-03-31 NOTE — Patient Instructions (Addendum)
   Access Code: [email or web address] URL: [email or web address] Date: 03/31/2021 Prepared by: Gillermo Murdoch  Exercises Prone Press Up On Elbows - 2 x daily - 7 x weekly - 1 sets - 3 reps - 30 sec hold Standing Back Extension - 5-8 x daily - 7 x weekly Supine Transversus Abdominis Bracing with Pelvic Floor Contraction - 2 x daily - 7 x weekly - 1 sets - 10 reps - 10sec hold Wall Quarter Squat - 1 x daily - 7 x weekly - 1 sets - 10 reps - 5 seconds hold Supine Hip Internal and External Rotation - 1 x daily - 7 x weekly - 1 sets - 2-3 reps - 5-10 seconds hold Doorway Pec Stretch at 120 Degrees Abduction - 1 x daily - 7 x weekly - 1 sets - 2 reps - 20 seconds hold Anti-Rotation Lateral Stepping with Press - 2 x daily - 7 x weekly - 1-2 sets - 10 reps - 2-3 sec hold Seated Hip Flexor Stretch - 2 x daily - 7 x weekly - 1 sets - 3 reps - 30 sec hold Standing Plank on Wall - 2 x daily - 7 x weekly - 1 sets - 3 reps - 30 sec hold

## 2021-03-31 NOTE — Therapy (Signed)
[facility] [address] [place], [place], [address] Phone: [phone]   Fax:  [phone]  Physical Therapy Treatment and Medicare 10th Visit Note   Progress Note Reporting Period 02/26/21 to 03/31/21  See note below for Objective Data and Assessment of Progress/Goals.         Patient Details  Name: Reginald Rios MRN: [id number] Date of Birth: March 19, 1956 Referring Provider (PT): Dr Lynne Leader   Encounter Date: 03/31/2021   PT End of Session - 03/31/21 1520     Visit Number 10    Number of Visits 12    Date for PT Re-Evaluation 04/09/21    PT Start Time 9528    PT Stop Time 1603    PT Time Calculation (min) 48 min    Activity Tolerance Patient tolerated treatment well             Past Medical History:  Diagnosis Date   ADD (attention deficit disorder)    Anemia    Arthritis    CAD (coronary artery disease)    2/19 PCI/DESx1 to Lcx   Diabetes mellitus without complication ([place])    Exercise-induced asthma    Heart murmur    Hyperlipidemia    family hx of high cholesterol   Hypogonadism in male    Low back pain    Osteoarthritis     Past Surgical History:  Procedure Laterality Date   CORONARY STENT INTERVENTION N/A 06/23/2017   Procedure: CORONARY STENT INTERVENTION;  Surgeon: Leonie Man, MD;  Location: [facility];  Service: Cardiovascular;  Laterality: N/A;   INTRAVASCULAR PRESSURE WIRE/FFR STUDY N/A 06/23/2017   Procedure: INTRAVASCULAR PRESSURE WIRE/FFR STUDY;  Surgeon: Leonie Man, MD;  Location: [facility];  Service: Cardiovascular;  Laterality: N/A;   KNEE ARTHROSCOPY     LEFT HEART CATH AND CORONARY ANGIOGRAPHY N/A 06/23/2017   Procedure: LEFT HEART CATH AND CORONARY ANGIOGRAPHY;  Surgeon: Leonie Man, MD;  Location: [facility];  Service: Cardiovascular;  Laterality: N/A;   SHOULDER ARTHROSCOPY WITH BICEPSTENOTOMY Right 09/06/2019   Procedure: SHOULDER  ARTHROSCOPY WITH BICEPSTENOTOMY;  Surgeon: Leandrew Koyanagi, MD;  Location: [facility];  Service: Orthopedics;  Laterality: Right;   SHOULDER ARTHROSCOPY WITH SUBACROMIAL DECOMPRESSION Right 09/06/2019   Procedure: RIGHT SHOULDER ARTHROSCOPY WITH EXTENSIVE DEBRIDEMENT, SUBACROMIAL DECOMPRESSION, BICEPS TENOTOMY;  Surgeon: Leandrew Koyanagi, MD;  Location: [facility];  Service: Orthopedics;  Laterality: Right;   TENOTOMY ACHILLES TENDON      There were no vitals filed for this visit.   Subjective Assessment - 03/31/21 1521     Subjective Back is feeling a lot better overall. Even worked in the yard some without any increase in pain, just some soreness. Some pain with trunk rotation exercise. Can get rid of pain for a period of time now. Encouraged by progress. Pool feels good. DN helps. He has started on a new medicine and is having trouble with his blood sugar level. Thinks he and his wife will join [employer].    Currently in Pain? Yes    Pain Score 2     Pain Location Back    Pain Orientation Lower    Pain Descriptors / Indicators Aching;Tightness    Pain Type Acute pain;Chronic pain                [facility] PT Assessment - 03/31/21 0001       Assessment   Medical  Diagnosis LBP with Lt > Rt LE radicular pain    Referring Provider (PT) Dr Lynne Leader    Onset Date/Surgical Date 02/05/21    Hand Dominance Right    Next MD Visit 04/02/21    Prior Therapy here for back; neck; LE's      Observation/Other Assessments   Focus on Therapeutic Outcomes (FOTO)  50      Posture/Postural Control   Posture Comments flexed forward in sitting and standing      AROM   Lumbar Flexion 70% tightness in LB    Lumbar Extension 10% feels good    Lumbar - Right Side Bend 60%    Lumbar - Left Side Bend 60%    Lumbar - Right Rotation 75%    Lumbar - Left Rotation 75%      Flexibility   Piriformis tight Rt > Lt      Palpation   Palpation comment muscular tightness bilat  lumbar paraspinals; Rt > Lt: Lt posterior hip - pirifromis and gluts                           [facility] Adult PT Treatment/Exercise - 03/31/21 0001       Lumbar Exercises: Stretches   Hip Flexor Stretch Right;Left;2 reps;30 seconds    Hip Flexor Stretch Limitations sitting    Standing Extension 5 seconds;5 reps    Prone on Elbows Stretch 3 reps;20 seconds    Quad Stretch Right;Left;1 rep;30 seconds      Lumbar Exercises: Aerobic   Nustep L6 x 6 minutes      Lumbar Exercises: Prone   Other Prone Lumbar Exercises wall plank 30 sec x5 reps      Knee/Hip Exercises: Standing   Gait Training walking fwd/backward in gym between exercises    Other Standing Knee Exercises antirotation green TB x 10 each side      Moist Heat Therapy   Number Minutes Moist Heat 10 Minutes    Moist Heat Location Lumbar Spine;Hip      Electrical Stimulation   Electrical Stimulation Location Lt LB    Electrical Stimulation Action mAmp x 5 min; microcurrent x 5 min    Electrical Stimulation Parameters to tolerance    Electrical Stimulation Goals Pain;Tone      Manual Therapy   Manual therapy comments skilled palpation to assess response to DN and manual work    Soft tissue mobilization deep tissue work through the lumbar paraspinals and bilat posterior hips    Myofascial Release lumbar paraspinals    Other Manual Therapy lumbar PA; posterior Greater Trochanter PA  mobs Grade II/III              Trigger Point Dry Needling - 03/31/21 0001     Consent Given? Yes    Education Handout Provided Previously provided    Printmaker Performed with Dry Needling Yes    Gluteus Medius Response Palpable increased muscle length    Gluteus Maximus Response Palpable increased muscle length    Piriformis Response Palpable increased muscle length    Erector spinae Response Palpable increased muscle length    Lumbar multifidi Response Palpable increased muscle length                    PT Education - 03/31/21 1540     Education Details HEP    Person(s) Educated Patient    Methods Explanation;Demonstration;Tactile cues;Verbal cues;Handout    Comprehension Verbalized understanding;Returned  demonstration;Verbal cues required;Tactile cues required                 PT Long Term Goals - 03/31/21 1526       PT LONG TERM GOAL #1   Title Patient to be independent with advanced HEP.    Time 6    Period Weeks    Status On-going    Target Date 04/09/21      PT LONG TERM GOAL #2   Title Improve trunk and LE mobility/ROM/tissue extensibility demonstrated by increased lumbar extension to 10% with no pain    Time 6    Period Weeks    Status Achieved      PT LONG TERM GOAL #3   Title Patient reports return to normal functional and recreational activities with minimal to no increase in pain > 1-2/10 pain    Time 6    Period Weeks    Status On-going    Target Date 04/09/21      PT LONG TERM GOAL #4   Title Patient reports return to pickleball at level prior to injury    Time 6    Period Weeks    Status On-going    Target Date 04/09/21      PT LONG TERM GOAL #5   Title Improve functional limitation score to 49    Time 6    Period Weeks    Status On-going    Target Date 04/09/21                   Plan - 03/31/21 1534     Clinical Impression Statement Progressing gradually with decreasing pain in the LB and LE's; improving mobilty; decreasing muscular tightness; increasing activity level. Patient is progressing well toward stated goals of therapy. He will benefit from continued therapy to achieve maximum rehab potential.    Rehab Potential Good    PT Frequency 2x / week    PT Duration 6 weeks    PT Treatment/Interventions ADLs/Self Care Home Management;Aquatic Therapy;Cryotherapy;Electrical Stimulation;Iontophoresis 4mg /ml Dexamethasone;Moist Heat;Traction;Ultrasound;Functional mobility training;Therapeutic activities;Therapeutic  exercise;Balance training;Neuromuscular re-education;Patient/family education;Manual techniques;Passive range of motion;Dry needling;Taping    PT Next Visit Plan progress with extension program as tolerated; core stabilization; manual work including lumbar mobs if indicated; modalities as indicated (has TENS unit at home)- hold trunk rotation in supine for HEP. Add antirotation exercise for home    PT [place] and Agree with Plan of Care Patient             Patient will benefit from skilled therapeutic intervention in order to improve the following deficits and impairments:     Visit Diagnosis: Acute bilateral low back pain with bilateral sciatica  Other symptoms and signs involving the musculoskeletal system  Weakness generalized  Stiffness of both hip joints  Hamstring strain, left, initial encounter     Problem List Patient Active Problem List   Diagnosis Date Noted   Osteoarthritis    Low back pain    Heart murmur    Exercise-induced asthma    Diabetes mellitus without complication (HCC)    Arthritis    Anemia    Myofascial pain syndrome, cervical 07/28/2019   Lateral epicondylitis of right elbow 07/28/2019   Nontraumatic incomplete tear of right rotator cuff 07/25/2019   Tendinopathy of right biceps tendon 07/25/2019   Erectile dysfunction 07/11/2019   Mild persistent asthma/cough variant asthma 04/13/2019   Allergic rhinitis with a possible nonallergic component 04/13/2019  Acid reflux 04/13/2019   Chronic cough 04/13/2019   Hoarseness 04/13/2019   Cervical radicular pain 01/10/2019   Ulnar neuropathy at elbow of right upper extremity 01/03/2019   Acute bilateral low back pain without sciatica 01/03/2019   Onychomycosis 05/25/2018   Palpitations 07/06/2017   CAD (coronary artery disease) 07/05/2017   Family history of early CAD 07/05/2017   Abnormal findings on diagnostic imaging of cardiovascular system 06/23/2017   Low  vitamin D level 04/22/2017   Pain in right ankle and joints of right foot 10/05/2016   Hemarthrosis of right elbow 02/03/2016   Small thenar eminence 02/03/2016   Cervical disc disorder with radiculopathy of cervical region 10/07/2015   Incomplete rotator cuff tear 09/10/2015   Nonischemic cardiomyopathy ([place]) 05/15/2015   Fatigue 05/15/2015   Dyspnea on exertion 05/15/2015   Decreased cardiac ejection fraction 04/02/2015   Strain of latissimus dorsi muscle 01/21/2015   Shoulder bursitis 10/12/2014   Stenosis of lateral recess of lumbar spine 10/12/2014   BPPV (benign paroxysmal positional vertigo) 08/14/2014   Hyperlipidemia 01/19/2014   Anemia, unspecified 01/19/2014   ADD (attention deficit disorder) 01/19/2014   Type 2 diabetes mellitus with hyperglycemia, without long-term current use of insulin ([place]) 12/22/2013   Hypogonadism in male 12/22/2013   Arthritis of hand, left 12/22/2013    Danasia Baker Nilda Simmer, PT, MPH  03/31/2021, 4:02 PM  [facility] [place], [place], [address] Phone: [phone]   Fax:  [phone]  Name: Reginald Rios MRN: [id number] Date of Birth: Dec 14, 1955

## 2021-04-01 NOTE — Progress Notes (Signed)
   I, [name], LAT, ATC, am serving as scribe for Dr. Lynne Leader.  Reginald Rios is a 65 y.o. male who presents to [place] at [facility] today for f/u of low back pain.  He was last seen by Dr. Georgina Snell on 02/19/21 w/ acute-onset LBP and was referred to PT of which he has completed 10 sessions.  He was also prescribed baclofen and a short course of prednisone.  Today, pt reports LBP has improved somewhat, but the pain is still there, esp w/ trunk rotation. Pt rates is pain at a 4/10. Pt notes his activity level as significantly decreased. Pt reports the the pain in the low back has moved from the midline to more the L side and into the L hip. Pt feels like the dry needling and being in the warm pool is helpful.  Diagnostic testing: L-spine XR- 02/19/21  Pertinent review of systems: No fevers or chills  Relevant historical information: Diabetes   Exam:  BP (!) 144/82   Pulse 76   Ht 5\' 10"  (1.778 m)   Wt 213 lb (96.6 kg)   SpO2 98%   BMI 30.56 kg/m  General: Well Developed, well nourished, and in no acute distress.   MSK: Spine: Nontender midline.  Decreased lumbar motion to extension.  Lower extremity strength intact.    Lab and Radiology Results EXAM: LUMBAR SPINE - 2-3 VIEW   COMPARISON:  06/01/2018   FINDINGS: Mild dextrocurvature. Vertebral body heights are maintained. Moderate severe degenerative changes at L1-L2 and L2-L3 with moderate degenerative changes L3 through S1. Facet degenerative changes of the lower lumbar spine. Aortic atherosclerosis.   IMPRESSION: Multilevel degenerative changes.  No acute osseous abnormality     Electronically Signed   By: Donavan Foil M.D.   On: 02/19/2021 20:45 I, Lynne Leader, personally (independently) visualized and performed the interpretation of the images attached in this note.     Assessment and Plan: 65 y.o. male with Chronic low back pain with some bilateral lumbosacral radiculopathy.   X-ray shows significant degenerative changes throughout his lumbar spine concerning for spinal stenosis.  He has had extensive trials of conservative management including PT and had some improvement but is still not sufficiently improved enough.  Plan to proceed to MRI for potential injection planning.  Patient is a candidate for both epidural steroid injection and facet joint injections. Recheck after MRI.  PDMP not reviewed this encounter. Orders Placed This Encounter  Procedures   MR Lumbar Spine Wo Contrast    Standing Status:   Future    Standing Expiration Date:   04/02/2022    Order Specific Question:   What is the patient's sedation requirement?    Answer:   No Sedation    Order Specific Question:   Does the patient have a pacemaker or implanted devices?    Answer:   No    Order Specific Question:   Preferred imaging location?    Answer:   Product/process development scientist (table limit-350lbs)   No orders of the defined types were placed in this encounter.    Discussed warning signs or symptoms. Please see discharge instructions. Patient expresses understanding.   The above documentation has been reviewed and is accurate and complete Lynne Leader, M.D.

## 2021-04-02 ENCOUNTER — Other Ambulatory Visit: Payer: Self-pay

## 2021-04-02 ENCOUNTER — Ambulatory Visit: Payer: 59 | Admitting: Family Medicine

## 2021-04-02 VITALS — BP 144/82 | HR 76 | Ht 70.0 in | Wt 213.0 lb

## 2021-04-02 DIAGNOSIS — M545 Low back pain, unspecified: Secondary | ICD-10-CM

## 2021-04-02 DIAGNOSIS — G8929 Other chronic pain: Secondary | ICD-10-CM

## 2021-04-02 NOTE — Patient Instructions (Addendum)
Thank you for coming in today.    Please call [facility] to schedule your MRI, [phone]   Recheck back after MRI

## 2021-04-04 ENCOUNTER — Ambulatory Visit: Payer: 59 | Admitting: Physical Therapy

## 2021-04-08 ENCOUNTER — Encounter: Payer: Self-pay | Admitting: Rehabilitative and Restorative Service Providers"

## 2021-04-08 ENCOUNTER — Ambulatory Visit (INDEPENDENT_AMBULATORY_CARE_PROVIDER_SITE_OTHER): Payer: 59 | Admitting: Rehabilitative and Restorative Service Providers"

## 2021-04-08 ENCOUNTER — Other Ambulatory Visit: Payer: Self-pay

## 2021-04-08 DIAGNOSIS — M25651 Stiffness of right hip, not elsewhere classified: Secondary | ICD-10-CM | POA: Diagnosis not present

## 2021-04-08 DIAGNOSIS — R531 Weakness: Secondary | ICD-10-CM | POA: Diagnosis not present

## 2021-04-08 DIAGNOSIS — M25652 Stiffness of left hip, not elsewhere classified: Secondary | ICD-10-CM | POA: Diagnosis not present

## 2021-04-08 DIAGNOSIS — M5441 Lumbago with sciatica, right side: Secondary | ICD-10-CM | POA: Diagnosis not present

## 2021-04-08 DIAGNOSIS — R29898 Other symptoms and signs involving the musculoskeletal system: Secondary | ICD-10-CM | POA: Diagnosis not present

## 2021-04-08 DIAGNOSIS — M5442 Lumbago with sciatica, left side: Secondary | ICD-10-CM | POA: Diagnosis not present

## 2021-04-08 NOTE — Patient Instructions (Signed)
Access Code: [email or web address] URL: [email or web address] Date: 04/08/2021 Prepared by: Gillermo Murdoch  Exercises Prone Press Up On Elbows - 2 x daily - 7 x weekly - 1 sets - 3 reps - 30 sec hold Standing Back Extension - 5-8 x daily - 7 x weekly Supine Transversus Abdominis Bracing with Pelvic Floor Contraction - 2 x daily - 7 x weekly - 1 sets - 10 reps - 10sec hold Wall Quarter Squat - 1 x daily - 7 x weekly - 1 sets - 10 reps - 5 seconds hold Supine Hip Internal and External Rotation - 1 x daily - 7 x weekly - 1 sets - 2-3 reps - 5-10 seconds hold Doorway Pec Stretch at 120 Degrees Abduction - 1 x daily - 7 x weekly - 1 sets - 2 reps - 20 seconds hold Anti-Rotation Lateral Stepping with Press - 2 x daily - 7 x weekly - 1-2 sets - 10 reps - 2-3 sec hold Seated Hip Flexor Stretch - 2 x daily - 7 x weekly - 1 sets - 3 reps - 30 sec hold Standing Plank on Wall - 2 x daily - 7 x weekly - 1 sets - 3 reps - 30 sec hold Seated Toe Flexion Extension PROM - 2 x daily - 7 x weekly - 1 sets - 10 reps - 4-5 sec hold Standing Toe Dorsiflexion Stretch - 2 x daily - 7 x weekly - 1 sets - 3 reps - 20-30 sec hold Gastroc Stretch on Wall - 2 x daily - 7 x weekly - 1 sets - 3 reps - 30 sec hold Soleus Stretch on Wall - 2 x daily - 7 x weekly - 1 sets - 3 reps - 30 sec hold

## 2021-04-08 NOTE — Therapy (Signed)
[place], [place], [address] Phone: [phone]   Fax:  [phone]  Physical Therapy Treatment  Patient Details  Name: Reginald Rios MRN: [id number] Date of Birth: 08-Oct-1955 Referring Provider (PT): Dr Lynne Leader   Encounter Date: 04/08/2021   PT End of Session - 04/08/21 0932     Visit Number 11    Number of Visits 20    Date for PT Re-Evaluation 05/20/21    Progress Note Due on Visit 20    PT Start Time 0930    PT Stop Time 1018    PT Time Calculation (min) 48 min    Activity Tolerance Patient tolerated treatment well             Past Medical History:  Diagnosis Date   ADD (attention deficit disorder)    Anemia    Arthritis    CAD (coronary artery disease)    2/19 PCI/DESx1 to Lcx   Diabetes mellitus without complication (HCC)    Exercise-induced asthma    Heart murmur    Hyperlipidemia    family hx of high cholesterol   Hypogonadism in male    Low back pain    Osteoarthritis     Past Surgical History:  Procedure Laterality Date   CORONARY STENT INTERVENTION N/A 06/23/2017   Procedure: CORONARY STENT INTERVENTION;  Surgeon: Leonie Man, MD;  Location: [place] [facility];  Service: Cardiovascular;  Laterality: N/A;   INTRAVASCULAR PRESSURE WIRE/FFR STUDY N/A 06/23/2017   Procedure: INTRAVASCULAR PRESSURE WIRE/FFR STUDY;  Surgeon: Leonie Man, MD;  Location: [place] [facility];  Service: Cardiovascular;  Laterality: N/A;   KNEE ARTHROSCOPY     LEFT HEART CATH AND CORONARY ANGIOGRAPHY N/A 06/23/2017   Procedure: LEFT HEART CATH AND CORONARY ANGIOGRAPHY;  Surgeon: Leonie Man, MD;  Location: [place] [facility];  Service: Cardiovascular;  Laterality: N/A;   SHOULDER ARTHROSCOPY WITH BICEPSTENOTOMY Right 09/06/2019   Procedure: SHOULDER ARTHROSCOPY WITH BICEPSTENOTOMY;  Surgeon: Leandrew Koyanagi, MD;  Location: [place];  Service: Orthopedics;   Laterality: Right;   SHOULDER ARTHROSCOPY WITH SUBACROMIAL DECOMPRESSION Right 09/06/2019   Procedure: RIGHT SHOULDER ARTHROSCOPY WITH EXTENSIVE DEBRIDEMENT, SUBACROMIAL DECOMPRESSION, BICEPS TENOTOMY;  Surgeon: Leandrew Koyanagi, MD;  Location: [place];  Service: Orthopedics;  Laterality: Right;   TENOTOMY ACHILLES TENDON      There were no vitals filed for this visit.   Subjective Assessment - 04/08/21 0932     Subjective Back pain is improving. Patient still has pain when walking the dog - the dog pulls on the leash. Has pain in the hips. Notices stiffness in his toes with walking and movement of toes.    Currently in Pain? Yes    Pain Score 6     Pain Location Back    Pain Orientation Right;Lower    Pain Descriptors / Indicators Aching;Tightness    Pain Type Acute pain;Chronic pain    Pain Onset More than a month ago    Pain Frequency Intermittent                [facility] PT Assessment - 04/08/21 0001       Assessment   Medical Diagnosis LBP with Lt > Rt LE radicular pain    Referring Provider (PT) Dr Lynne Leader    Onset Date/Surgical Date 02/05/21    Hand Dominance Right    Next MD Visit to schedule aafter MRI  Prior Therapy here for back; neck; LE's      Sensation   Additional Comments tingling bilat LE's to knees posterior thighs and to foot on the Lt      AROM   Overall AROM Comments significant tightness through bilat hips Rt > Lt    Right Ankle Dorsiflexion 2    Left Ankle Dorsiflexion 5    Lumbar Flexion 70% tightness in LB    Lumbar Extension 10% feels good    Lumbar - Right Side Bend 60%    Lumbar - Left Side Bend 60%    Lumbar - Right Rotation 75%    Lumbar - Left Rotation 75%      Flexibility   Hamstrings ~ 75 deg Lt; 70 deg Rt    Quadriceps tight Rt > Lt    ITB WFL's bilat    Piriformis tight Rt > Lt      Palpation   Palpation comment muscular tightness bilat lumbar paraspinals; Rt > Lt: Lt posterior hip - pirifromis and gluts       Special Tests   Other special tests AROM great toe extension - Rt 9 deg; Lt 12 deg                           [facility] Adult PT Treatment/Exercise - 04/08/21 0001       Lumbar Exercises: Stretches   Gastroc Stretch Right;Left;2 reps;30 seconds    Gastroc Stretch Limitations soleus stretch Rt/Lt x 1 reps x 30 sec hold    Other Lumbar Stretch Exercise toe extension sitting 30 sec x 1 reps; toe flexion/ext with pt performing AAROM x 5-10 reps each foot      Lumbar Exercises: Aerobic   Nustep L6 x 7 minutes      Lumbar Exercises: Prone   Other Prone Lumbar Exercises wall plank 30 sec x5 reps      Knee/Hip Exercises: Standing   Other Standing Knee Exercises antirotation green TB x 10 each side      Moist Heat Therapy   Number Minutes Moist Heat 10 Minutes    Moist Heat Location Lumbar Spine;Hip      Manual Therapy   Joint Mobilization Grade III PA mobs lumbar spine PA and lateral bilat    Soft tissue mobilization deep tissue work through the lumbar paraspinals and bilat posterior hips                     PT Education - 04/08/21 1003     Education Details HEP    Person(s) Educated Patient    Methods Explanation;Demonstration;Tactile cues;Verbal cues;Handout    Comprehension Verbalized understanding;Returned demonstration;Verbal cues required;Tactile cues required                 PT Long Term Goals - 04/08/21 0939       PT LONG TERM GOAL #1   Title Patient to be independent with advanced HEP.    Time 6    Period Weeks    Status On-going    Target Date 05/20/21      PT LONG TERM GOAL #2   Title Improve trunk and LE mobility/ROM/tissue extensibility demonstrated by increased lumbar extension to 15-20% with no pain    Time 6    Period Weeks    Status New    Target Date 05/20/21      PT LONG TERM GOAL #3   Title Patient reports return to normal functional  and recreational activities with minimal to no increase in pain > 1-2/10 pain     Time 6    Period Weeks    Status On-going    Target Date 05/20/21      PT LONG TERM GOAL #4   Title Patient reports return to pickleball at level prior to injury    Time 6    Period Weeks    Status On-going    Target Date 05/20/21      PT LONG TERM GOAL #5   Title Improve functional limitation score to 49    Time 6    Period Weeks    Status On-going    Target Date 05/20/21                   Plan - 04/08/21 0941     Clinical Impression Statement Gradually progressing with decreasing pain in the LB and LE's; improving mobility and ROM; decreasing muscular tightness; increasing activity. Reginald Rios has tightness through bilat ankles and great toes. Added stretches to address tightness. Patient continues to have increased back and LE radicular pain on an intermittent basis. He continues to have difficulty with some functional activities. Patient is progressing toward goals of therapy and will benefit from continued treatment to achieve maximum rehab potential.    Rehab Potential Good    PT Frequency 2x / week    PT Duration 6 weeks    PT Treatment/Interventions ADLs/Self Care Home Management;Aquatic Therapy;Cryotherapy;Electrical Stimulation;Iontophoresis 4mg /ml Dexamethasone;Moist Heat;Traction;Ultrasound;Functional mobility training;Therapeutic activities;Therapeutic exercise;Balance training;Neuromuscular re-education;Patient/family education;Manual techniques;Passive range of motion;Dry needling;Taping    PT Next Visit Plan progress with extension program as tolerated; core stabilization; manual work including lumbar mobs if indicated; modalities as indicated (has TENS unit at home)- hold trunk rotation in supine for HEP. Assess response to ankle and toe stretches.    PT Home Exercise Plan [email or web address]    Consulted and Agree with Plan of Care Patient             Patient will benefit from skilled therapeutic intervention in order to improve the following deficits and impairments:      Visit Diagnosis: Acute bilateral low back pain with bilateral sciatica  Other symptoms and signs involving the musculoskeletal system  Weakness generalized  Stiffness of both hip joints     Problem List Patient Active Problem List   Diagnosis Date Noted   Osteoarthritis    Low back pain    Heart murmur    Exercise-induced asthma    Diabetes mellitus without complication (HCC)    Arthritis    Anemia    Myofascial pain syndrome, cervical 07/28/2019   Lateral epicondylitis of right elbow 07/28/2019   Nontraumatic incomplete tear of right rotator cuff 07/25/2019   Tendinopathy of right biceps tendon 07/25/2019   Erectile dysfunction 07/11/2019   Mild persistent asthma/cough variant asthma 04/13/2019   Allergic rhinitis with a possible nonallergic component 04/13/2019   Acid reflux 04/13/2019   Chronic cough 04/13/2019   Hoarseness 04/13/2019   Cervical radicular pain 01/10/2019   Ulnar neuropathy at elbow of right upper extremity 01/03/2019   Acute bilateral low back pain without sciatica 01/03/2019   Onychomycosis 05/25/2018   Palpitations 07/06/2017   CAD (coronary artery disease) 07/05/2017   Family history of early CAD 07/05/2017   Abnormal findings on diagnostic imaging of cardiovascular system 06/23/2017   Low vitamin D level 04/22/2017   Pain in right ankle and joints of right foot 10/05/2016   Hemarthrosis of right elbow  02/03/2016   Small thenar eminence 02/03/2016   Cervical disc disorder with radiculopathy of cervical region 10/07/2015   Incomplete rotator cuff tear 09/10/2015   Nonischemic cardiomyopathy ([place]) 05/15/2015   Fatigue 05/15/2015   Dyspnea on exertion 05/15/2015   Decreased cardiac ejection fraction 04/02/2015   Strain of latissimus dorsi muscle 01/21/2015   Shoulder bursitis 10/12/2014   Stenosis of lateral recess of lumbar spine 10/12/2014   BPPV (benign paroxysmal positional vertigo) 08/14/2014   Hyperlipidemia 01/19/2014   Anemia,  unspecified 01/19/2014   ADD (attention deficit disorder) 01/19/2014   Type 2 diabetes mellitus with hyperglycemia, without long-term current use of insulin ([place]) 12/22/2013   Hypogonadism in male 12/22/2013   Arthritis of hand, left 12/22/2013    Marijo Quizon Nilda Simmer, PT, MPH  04/08/2021, 10:17 AM  [facility] [place], [place], [address] Phone: [phone]   Fax:  [phone]  Name: Reginald Rios MRN: [id number] Date of Birth: 12-Jan-1956

## 2021-04-11 ENCOUNTER — Other Ambulatory Visit: Payer: Self-pay

## 2021-04-11 ENCOUNTER — Ambulatory Visit (INDEPENDENT_AMBULATORY_CARE_PROVIDER_SITE_OTHER): Payer: 59 | Admitting: Physical Therapy

## 2021-04-11 DIAGNOSIS — R29898 Other symptoms and signs involving the musculoskeletal system: Secondary | ICD-10-CM

## 2021-04-11 DIAGNOSIS — M5441 Lumbago with sciatica, right side: Secondary | ICD-10-CM

## 2021-04-11 DIAGNOSIS — M5442 Lumbago with sciatica, left side: Secondary | ICD-10-CM | POA: Diagnosis not present

## 2021-04-11 DIAGNOSIS — R531 Weakness: Secondary | ICD-10-CM

## 2021-04-11 NOTE — Therapy (Signed)
[place], [place], [address] Phone: [phone]   Fax:  [phone]  Physical Therapy Treatment  Patient Details  Name: Reginald Rios MRN: [id number] Date of Birth: 03-12-1956 Referring Provider (PT): Dr Lynne Leader   Encounter Date: 04/11/2021   PT End of Session - 04/11/21 1443     Visit Number 12    Number of Visits 20    Date for PT Re-Evaluation 05/20/21    Progress Note Due on Visit 96    PT Start Time 1400    PT Stop Time 5809    PT Time Calculation (min) 45 min    Activity Tolerance Patient tolerated treatment well    Behavior During Therapy [facility] for tasks assessed/performed             Past Medical History:  Diagnosis Date   ADD (attention deficit disorder)    Anemia    Arthritis    CAD (coronary artery disease)    2/19 PCI/DESx1 to Lcx   Diabetes mellitus without complication (HCC)    Exercise-induced asthma    Heart murmur    Hyperlipidemia    family hx of high cholesterol   Hypogonadism in male    Low back pain    Osteoarthritis     Past Surgical History:  Procedure Laterality Date   CORONARY STENT INTERVENTION N/A 06/23/2017   Procedure: CORONARY STENT INTERVENTION;  Surgeon: Leonie Man, MD;  Location: [place] [facility];  Service: Cardiovascular;  Laterality: N/A;   INTRAVASCULAR PRESSURE WIRE/FFR STUDY N/A 06/23/2017   Procedure: INTRAVASCULAR PRESSURE WIRE/FFR STUDY;  Surgeon: Leonie Man, MD;  Location: [place] [facility];  Service: Cardiovascular;  Laterality: N/A;   KNEE ARTHROSCOPY     LEFT HEART CATH AND CORONARY ANGIOGRAPHY N/A 06/23/2017   Procedure: LEFT HEART CATH AND CORONARY ANGIOGRAPHY;  Surgeon: Leonie Man, MD;  Location: [place] [facility];  Service: Cardiovascular;  Laterality: N/A;   SHOULDER ARTHROSCOPY WITH BICEPSTENOTOMY Right 09/06/2019   Procedure: SHOULDER ARTHROSCOPY WITH BICEPSTENOTOMY;  Surgeon: Leandrew Koyanagi, MD;   Location: [place];  Service: Orthopedics;  Laterality: Right;   SHOULDER ARTHROSCOPY WITH SUBACROMIAL DECOMPRESSION Right 09/06/2019   Procedure: RIGHT SHOULDER ARTHROSCOPY WITH EXTENSIVE DEBRIDEMENT, SUBACROMIAL DECOMPRESSION, BICEPS TENOTOMY;  Surgeon: Leandrew Koyanagi, MD;  Location: [place];  Service: Orthopedics;  Laterality: Right;   TENOTOMY ACHILLES TENDON      There were no vitals filed for this visit.   Subjective Assessment - 04/11/21 1409     Subjective Pt reports that his Lt hip has been bothering him when he walks the dog; he starts limping and his back pain increases.  He likes the [employer] press - "It's really working"    Currently in Pain? Yes    Pain Score 4     Pain Location Back    Pain Orientation Lower    Pain Descriptors / Indicators Aching;Tightness    Aggravating Factors  forward bending, twisting    Pain Relieving Factors TENS, ice/heat, extension exercises.             Treatment:    Forward and backward gait with cues for upright posture.     Holding onto wall (at 18ft depth): hip abdct x 10, hip ext x 10.   Hip flexion/ knee flexion circles x 5 reps, 2 sets.  Heel raises x 10.  Calf stretch each leg.    Side stepping  with squat.  Light forward jog.   Squats x 10. Forward lunge x 5 each leg forward.    Transverse abd set with hands on yellow noodle, slightly submerging under water x 5 sec x 10, repeated with row with noodle - cues for engaging lower traps, too x 15.     Holding yellow noodle:  small curtsy with Knee to noodle (hip abdct/knee flexion) x 5 each leg. Back lunge to knee to noodle x 5 each leg  - pt demo decreased balance.    Wall push ups with / without push off x 10   Back supported float with noodle under arms, knees and nekdoodle on neck for decompression of spine x 4 min at end of session.       Pt requires buoyancy for support and to offload joints with strengthening exercises. Viscosity of the water  is needed for resistance of strengthening; water current perturbations provides challenge to standing balance unsupported, requiring increased core activation.       PT Long Term Goals - 04/08/21 0939       PT LONG TERM GOAL #1   Title Patient to be independent with advanced HEP.    Time 6    Period Weeks    Status On-going    Target Date 05/20/21      PT LONG TERM GOAL #2   Title Improve trunk and LE mobility/ROM/tissue extensibility demonstrated by increased lumbar extension to 15-20% with no pain    Time 6    Period Weeks    Status New    Target Date 05/20/21      PT LONG TERM GOAL #3   Title Patient reports return to normal functional and recreational activities with minimal to no increase in pain > 1-2/10 pain    Time 6    Period Weeks    Status On-going    Target Date 05/20/21      PT LONG TERM GOAL #4   Title Patient reports return to pickleball at level prior to injury    Time 6    Period Weeks    Status On-going    Target Date 05/20/21      PT LONG TERM GOAL #5   Title Improve functional limitation score to 49    Time 6    Period Weeks    Status On-going    Target Date 05/20/21                   Plan - 04/11/21 1614     Clinical Impression Statement Pt tolerating aquatic exercises in water with depth of 4 ft, 4" ,reporting reduction of pain during session.  He continues to require occasional cues for posture.  He demonstrates decreased balance with exercises on single leg without UE support.  Overall back pain decreasing and body mechanics and self awareness of posture is improving. Progressing towards LTGs.    Rehab Potential Good    PT Frequency 2x / week    PT Duration 6 weeks    PT Treatment/Interventions ADLs/Self Care Home Management;Aquatic Therapy;Cryotherapy;Electrical Stimulation;Iontophoresis 4mg /ml Dexamethasone;Moist Heat;Traction;Ultrasound;Functional mobility training;Therapeutic activities;Therapeutic exercise;Balance  training;Neuromuscular re-education;Patient/family education;Manual techniques;Passive range of motion;Dry needling;Taping    PT Next Visit Plan progress with extension program as tolerated; core stabilization; manual work including lumbar mobs if indicated; modalities as indicated (has TENS unit at home)- hold trunk rotation in supine for HEP. Assess response to ankle and toe stretches.    [place]  Consulted and Agree with Plan of Care Patient             Patient will benefit from skilled therapeutic intervention in order to improve the following deficits and impairments:  Abnormal gait, Difficulty walking, Decreased activity tolerance, Pain, Hypomobility, Impaired flexibility, Improper body mechanics, Decreased mobility, Decreased strength, Postural dysfunction, Impaired sensation  Visit Diagnosis: Acute bilateral low back pain with bilateral sciatica  Other symptoms and signs involving the musculoskeletal system  Weakness generalized     Problem List Patient Active Problem List   Diagnosis Date Noted   Osteoarthritis    Low back pain    Heart murmur    Exercise-induced asthma    Diabetes mellitus without complication (HCC)    Arthritis    Anemia    Myofascial pain syndrome, cervical 07/28/2019   Lateral epicondylitis of right elbow 07/28/2019   Nontraumatic incomplete tear of right rotator cuff 07/25/2019   Tendinopathy of right biceps tendon 07/25/2019   Erectile dysfunction 07/11/2019   Mild persistent asthma/cough variant asthma 04/13/2019   Allergic rhinitis with a possible nonallergic component 04/13/2019   Acid reflux 04/13/2019   Chronic cough 04/13/2019   Hoarseness 04/13/2019   Cervical radicular pain 01/10/2019   Ulnar neuropathy at elbow of right upper extremity 01/03/2019   Acute bilateral low back pain without sciatica 01/03/2019   Onychomycosis 05/25/2018   Palpitations 07/06/2017   CAD (coronary artery disease) 07/05/2017    Family history of early CAD 07/05/2017   Abnormal findings on diagnostic imaging of cardiovascular system 06/23/2017   Low vitamin D level 04/22/2017   Pain in right ankle and joints of right foot 10/05/2016   Hemarthrosis of right elbow 02/03/2016   Small thenar eminence 02/03/2016   Cervical disc disorder with radiculopathy of cervical region 10/07/2015   Incomplete rotator cuff tear 09/10/2015   Nonischemic cardiomyopathy ([place]) 05/15/2015   Fatigue 05/15/2015   Dyspnea on exertion 05/15/2015   Decreased cardiac ejection fraction 04/02/2015   Strain of latissimus dorsi muscle 01/21/2015   Shoulder bursitis 10/12/2014   Stenosis of lateral recess of lumbar spine 10/12/2014   BPPV (benign paroxysmal positional vertigo) 08/14/2014   Hyperlipidemia 01/19/2014   Anemia, unspecified 01/19/2014   ADD (attention deficit disorder) 01/19/2014   Type 2 diabetes mellitus with hyperglycemia, without long-term current use of insulin ([place]) 12/22/2013   Hypogonadism in male 12/22/2013   Arthritis of hand, left 12/22/2013   Kerin Perna, PTA 04/11/21 4:22 PM   [facility] [place], [place], [address] Phone: [phone]   Fax:  [phone]  Name: REHAN HOLNESS MRN: [id number] Date of Birth: Nov 21, 1955

## 2021-04-17 ENCOUNTER — Other Ambulatory Visit (HOSPITAL_BASED_OUTPATIENT_CLINIC_OR_DEPARTMENT_OTHER): Payer: Self-pay

## 2021-04-17 ENCOUNTER — Other Ambulatory Visit: Payer: Self-pay | Admitting: Internal Medicine

## 2021-04-17 ENCOUNTER — Other Ambulatory Visit: Payer: Self-pay | Admitting: Family Medicine

## 2021-04-17 DIAGNOSIS — E1142 Type 2 diabetes mellitus with diabetic polyneuropathy: Secondary | ICD-10-CM

## 2021-04-17 DIAGNOSIS — K219 Gastro-esophageal reflux disease without esophagitis: Secondary | ICD-10-CM

## 2021-04-17 MED ORDER — METFORMIN HCL 1000 MG PO TABS
ORAL_TABLET | Freq: Two times a day (BID) | ORAL | 3 refills | Status: DC
  Filled 2021-04-17: qty 180, 90d supply, fill #0

## 2021-04-17 MED ORDER — PANTOPRAZOLE SODIUM 40 MG PO TBEC
40.0000 mg | DELAYED_RELEASE_TABLET | Freq: Every day | ORAL | 3 refills | Status: DC
  Filled 2021-04-17: qty 90, 90d supply, fill #0
  Filled 2021-08-04: qty 90, 90d supply, fill #1

## 2021-04-17 MED FILL — Montelukast Sodium Tab 10 MG (Base Equiv): ORAL | 90 days supply | Qty: 90 | Fill #2 | Status: AC

## 2021-04-18 ENCOUNTER — Ambulatory Visit: Payer: 59 | Admitting: Family Medicine

## 2021-04-21 ENCOUNTER — Encounter: Payer: Self-pay | Admitting: Family Medicine

## 2021-04-21 ENCOUNTER — Ambulatory Visit: Payer: 59 | Admitting: Family Medicine

## 2021-04-21 ENCOUNTER — Other Ambulatory Visit (HOSPITAL_BASED_OUTPATIENT_CLINIC_OR_DEPARTMENT_OTHER): Payer: Self-pay

## 2021-04-21 VITALS — BP 124/78 | HR 87 | Temp 97.8°F | Ht 70.0 in | Wt 211.0 lb

## 2021-04-21 DIAGNOSIS — M7989 Other specified soft tissue disorders: Secondary | ICD-10-CM

## 2021-04-21 DIAGNOSIS — Z23 Encounter for immunization: Secondary | ICD-10-CM | POA: Diagnosis not present

## 2021-04-21 DIAGNOSIS — E1165 Type 2 diabetes mellitus with hyperglycemia: Secondary | ICD-10-CM | POA: Diagnosis not present

## 2021-04-21 LAB — COMPREHENSIVE METABOLIC PANEL
ALT: 20 U/L (ref 0–53)
AST: 19 U/L (ref 0–37)
Albumin: 4.4 g/dL (ref 3.5–5.2)
Alkaline Phosphatase: 59 U/L (ref 39–117)
BUN: 22 mg/dL (ref 6–23)
CO2: 29 mEq/L (ref 19–32)
Calcium: 9.8 mg/dL (ref 8.4–10.5)
Chloride: 102 mEq/L (ref 96–112)
Creatinine, Ser: 0.87 mg/dL (ref 0.40–1.50)
GFR: 89.02 mL/min (ref >60.00)
Glucose, Bld: 143 mg/dL — ABNORMAL HIGH (ref 70–99)
Potassium: 4.5 mEq/L (ref 3.5–5.1)
Sodium: 140 mEq/L (ref 135–145)
Total Bilirubin: 0.4 mg/dL (ref 0.2–1.2)
Total Protein: 7.1 g/dL (ref 6.0–8.3)

## 2021-04-21 LAB — LIPID PANEL
Cholesterol: 135 mg/dL (ref 0–200)
HDL: 51.7 mg/dL (ref >39.00)
LDL Cholesterol: 66 mg/dL (ref 0–99)
NonHDL: 83.01
Total CHOL/HDL Ratio: 3
Triglycerides: 83 mg/dL (ref 0.0–149.0)
VLDL: 16.6 mg/dL (ref 0.0–40.0)

## 2021-04-21 LAB — HEMOGLOBIN A1C: Hgb A1c MFr Bld: 7.4 % — ABNORMAL HIGH (ref 4.6–6.5)

## 2021-04-21 LAB — TSH: TSH: 3.09 u[IU]/mL (ref 0.35–5.50)

## 2021-04-21 MED ORDER — METFORMIN HCL 1000 MG PO TABS
ORAL_TABLET | Freq: Two times a day (BID) | ORAL | 3 refills | Status: DC
  Filled 2021-08-19 – 2021-10-21 (×3): qty 180, 90d supply, fill #0
  Filled 2021-10-21: qty 180, 90d supply, fill #1
  Filled 2021-10-30: qty 180, 90d supply, fill #0
  Filled 2022-02-24: qty 180, 90d supply, fill #1

## 2021-04-21 MED ORDER — GLIMEPIRIDE 1 MG PO TABS
1.0000 mg | ORAL_TABLET | Freq: Every day | ORAL | 5 refills | Status: DC
  Filled 2021-04-21: qty 30, 30d supply, fill #0
  Filled 2021-06-01: qty 30, 30d supply, fill #1
  Filled 2021-07-01: qty 30, 30d supply, fill #2

## 2021-04-21 NOTE — Addendum Note (Signed)
Addended by: Sharon Seller B on: 04/21/2021 10:10 AM   Modules accepted: Orders

## 2021-04-21 NOTE — Progress Notes (Signed)
Subjective:   Chief Complaint  Patient presents with   Follow-up    3 month Low Back pain    Reginald Rios is a 65 y.o. male here for follow-up of diabetes.   Reginald Rios's self monitored glucose range is 70-mid 100's.  Patient confirms hypoglycemic reactions. He checks his glucose levels 1-2 time(s) per day. Patient does not require insulin.   Medications include: Amaryl 1 mg/d, Metformin 1000 mg bid, Actos 45 mg/d, Januvia 100 mg/d Diet is fair.  Exercise: minimal No Cp or SOB.   He is having some swelling over the past few weeks. No calf pain. No SOB or hx of renal/hepatic failure. Has not been as active lately due to back issues.   Past Medical History:  Diagnosis Date   ADD (attention deficit disorder)    Anemia    Arthritis    CAD (coronary artery disease)    2/19 PCI/DESx1 to Lcx   Diabetes mellitus without complication (HCC)    Exercise-induced asthma    Heart murmur    Hyperlipidemia    family hx of high cholesterol   Hypogonadism in male    Low back pain    Osteoarthritis      Related testing: Retinal exam: Done Pneumovax: Due for PCV20  Objective:  BP 124/78    Pulse 87    Temp 97.8 F (36.6 C) (Oral)    Ht 5\' 10"  (1.778 m)    Wt 211 lb (95.7 kg)    SpO2 99%    BMI 30.28 kg/m  General:  Well developed, well nourished, in no apparent distress Skin:  Warm, no pallor or diaphoresis Lungs:  CTAB, no access msc use Cardio:  RRR, no bruits, no LE edema today Psych: Age appropriate judgment and insight  Assessment:   Type 2 diabetes mellitus with hyperglycemia, without long-term current use of insulin (HCC) - Plan: glimepiride (AMARYL) 1 MG tablet, metFORMIN (GLUCOPHAGE) 1000 MG tablet, Lipid panel, Hemoglobin A1c  Localized swelling of both lower extremities - Plan: Comprehensive metabolic panel, TSH   Plan:   Chronic, unstable. Cont Metformin 1000 mg bid, Amaryl 1 mg/d, Actos 45 mg/d, Januvia 100 mg/d. PCV20 today. Counseled on diet and exercise. New,  uncertain prog. Ck labs. Elevate legs, mind salt intake, compression stockings, activity recommended.  F/u in 3-6 mo. The patient voiced understanding and agreement to the plan.  [place], DO 04/21/21 9:47 AM

## 2021-04-21 NOTE — Patient Instructions (Addendum)
Give [place] 2-3 business days to get the results of your labs back.   Keep the diet clean and stay active.  Continue to monitor your blood sugars at home.   For the swelling in your lower extremities, be sure to elevate your legs when able, mind the salt intake, stay physically active and consider wearing compression stockings.  Let us know if you need anything.

## 2021-04-22 ENCOUNTER — Ambulatory Visit (INDEPENDENT_AMBULATORY_CARE_PROVIDER_SITE_OTHER): Payer: 59 | Admitting: Physical Therapy

## 2021-04-22 ENCOUNTER — Other Ambulatory Visit: Payer: Self-pay

## 2021-04-22 ENCOUNTER — Encounter: Payer: Self-pay | Admitting: Physical Therapy

## 2021-04-22 DIAGNOSIS — M5441 Lumbago with sciatica, right side: Secondary | ICD-10-CM | POA: Diagnosis not present

## 2021-04-22 DIAGNOSIS — R531 Weakness: Secondary | ICD-10-CM

## 2021-04-22 DIAGNOSIS — R29898 Other symptoms and signs involving the musculoskeletal system: Secondary | ICD-10-CM

## 2021-04-22 DIAGNOSIS — M25651 Stiffness of right hip, not elsewhere classified: Secondary | ICD-10-CM | POA: Diagnosis not present

## 2021-04-22 DIAGNOSIS — M25652 Stiffness of left hip, not elsewhere classified: Secondary | ICD-10-CM | POA: Diagnosis not present

## 2021-04-22 DIAGNOSIS — M5442 Lumbago with sciatica, left side: Secondary | ICD-10-CM

## 2021-04-22 NOTE — Therapy (Signed)
[facility] [place], [place], [address] Phone: [phone]   Fax:  [phone]  Physical Therapy Treatment  Patient Details  Name: Reginald Rios MRN: [id number] Date of Birth: 02-01-1956 Referring Provider (PT): Dr Lynne Leader   Encounter Date: 04/22/2021   PT End of Session - 04/22/21 1109     Visit Number 13    Number of Visits 20    Date for PT Re-Evaluation 05/20/21    Progress Note Due on Visit 52    PT Start Time 1104    PT Stop Time 3845    PT Time Calculation (min) 41 min    Activity Tolerance Patient tolerated treatment well    Behavior During Therapy [facility] for tasks assessed/performed             Past Medical History:  Diagnosis Date   ADD (attention deficit disorder)    Anemia    Arthritis    CAD (coronary artery disease)    2/19 PCI/DESx1 to Lcx   Diabetes mellitus without complication ([place])    Exercise-induced asthma    Heart murmur    Hyperlipidemia    family hx of high cholesterol   Hypogonadism in male    Low back pain    Osteoarthritis     Past Surgical History:  Procedure Laterality Date   CORONARY STENT INTERVENTION N/A 06/23/2017   Procedure: CORONARY STENT INTERVENTION;  Surgeon: Leonie Man, MD;  Location: [place] [facility];  Service: Cardiovascular;  Laterality: N/A;   INTRAVASCULAR PRESSURE WIRE/FFR STUDY N/A 06/23/2017   Procedure: INTRAVASCULAR PRESSURE WIRE/FFR STUDY;  Surgeon: Leonie Man, MD;  Location: [place] [facility];  Service: Cardiovascular;  Laterality: N/A;   KNEE ARTHROSCOPY     LEFT HEART CATH AND CORONARY ANGIOGRAPHY N/A 06/23/2017   Procedure: LEFT HEART CATH AND CORONARY ANGIOGRAPHY;  Surgeon: Leonie Man, MD;  Location: [place] [facility];  Service: Cardiovascular;  Laterality: N/A;   SHOULDER ARTHROSCOPY WITH BICEPSTENOTOMY Right 09/06/2019   Procedure: SHOULDER ARTHROSCOPY WITH BICEPSTENOTOMY;  Surgeon: Leandrew Koyanagi, MD;   Location: [place];  Service: Orthopedics;  Laterality: Right;   SHOULDER ARTHROSCOPY WITH SUBACROMIAL DECOMPRESSION Right 09/06/2019   Procedure: RIGHT SHOULDER ARTHROSCOPY WITH EXTENSIVE DEBRIDEMENT, SUBACROMIAL DECOMPRESSION, BICEPS TENOTOMY;  Surgeon: Leandrew Koyanagi, MD;  Location: [place];  Service: Orthopedics;  Laterality: Right;   TENOTOMY ACHILLES TENDON      There were no vitals filed for this visit.   Subjective Assessment - 04/22/21 1106     Subjective Pt reports he had rolfing done on Saturday. "I think it really helped with my hip".  "Dummy me, sat around Sunday and stiffened up, and now I'm hurting".    Patient Stated Goals get active again to prevent weight gain; play pickle ball    Currently in Pain? Yes    Pain Score 3     Pain Location Back    Pain Orientation Lower    Pain Descriptors / Indicators Aching;Tightness    Aggravating Factors  forward bending, twisting    Pain Relieving Factors TENS, extension exercises.                [facility] PT Assessment - 04/22/21 0001       Assessment   Medical Diagnosis LBP with Lt > Rt LE radicular pain    Referring Provider (PT) Dr Lynne Leader    Onset Date/Surgical Date 02/05/21  Hand Dominance Right    Next MD Visit to schedule after MRI    Prior Therapy here for back; neck; LE's               [facility] Adult PT Treatment/Exercise - 04/22/21 0001       Lumbar Exercises: Stretches   Hip Flexor Stretch Right;Left;2 reps;20 seconds   seated, leg back, arm overhead.   Standing Extension 3 reps;5 seconds    Figure 4 Stretch 1 rep;20 seconds   seated   Gastroc Stretch Right;Left;2 reps;20 seconds    Other Lumbar Stretch Exercise L stretch x 10 sec x 2 reps (after counter plank); midlevel doorway stretch x 2 reps of 20 sec.      Lumbar Exercises: Standing   Wall Slides 10 reps;2 seconds   2# out/in from core.  cues for coordination   Shoulder Extension Strengthening;Both;10 reps     Theraband Level (Shoulder Extension) Level 3 (Green)    Other Standing Lumbar Exercises opp arm leg with arms above head x 5 reps each side x 2 sets    Other Standing Lumbar Exercises Pallof press with green band x 6 reps each side, tactile cues to get trunk upright.  Mini split squat to single overhead press with 2# x 10 each side.      Lumbar Exercises: Prone   Other Prone Lumbar Exercises counter plank x 30 sec x 2 reps              Aerobic - NuStep, L5 x 6 min with arms/legs for warm up.  Single laps around gym in between exercises to assess response.       PT Long Term Goals - 04/08/21 0939       PT LONG TERM GOAL #1   Title Patient to be independent with advanced HEP.    Time 6    Period Weeks    Status On-going    Target Date 05/20/21      PT LONG TERM GOAL #2   Title Improve trunk and LE mobility/ROM/tissue extensibility demonstrated by increased lumbar extension to 15-20% with no pain    Time 6    Period Weeks    Status New    Target Date 05/20/21      PT LONG TERM GOAL #3   Title Patient reports return to normal functional and recreational activities with minimal to no increase in pain > 1-2/10 pain    Time 6    Period Weeks    Status On-going    Target Date 05/20/21      PT LONG TERM GOAL #4   Title Patient reports return to pickleball at level prior to injury    Time 6    Period Weeks    Status On-going    Target Date 05/20/21      PT LONG TERM GOAL #5   Title Improve functional limitation score to 49    Time 6    Period Weeks    Status On-going    Target Date 05/20/21                   Plan - 04/22/21 1141     Clinical Impression Statement Pt requires occasional cues for upright posture and .  Pt reported improved back pain, and less stiffness during session.Encouraged pt to incorporate stretches into daily life to assist with improved mobility/ less pain. Progressing well towards remaining goals.    Rehab Potential Good    PT  Frequency 2x / week    PT Duration 6 weeks    PT Treatment/Interventions ADLs/Self Care Home Management;Aquatic Therapy;Cryotherapy;Electrical Stimulation;Iontophoresis 4mg /ml Dexamethasone;Moist Heat;Traction;Ultrasound;Functional mobility training;Therapeutic activities;Therapeutic exercise;Balance training;Neuromuscular re-education;Patient/family education;Manual techniques;Passive range of motion;Dry needling;Taping    PT Next Visit Plan progress with extension program as tolerated; core stabilization; manual work including lumbar mobs if indicated; modalities as indicated (has TENS unit at home)- hold trunk rotation in supine for HEP.    PT Home Exercise Plan [email or web address]    Consulted and Agree with Plan of Care Patient             Patient will benefit from skilled therapeutic intervention in order to improve the following deficits and impairments:  Abnormal gait, Difficulty walking, Decreased activity tolerance, Pain, Hypomobility, Impaired flexibility, Improper body mechanics, Decreased mobility, Decreased strength, Postural dysfunction, Impaired sensation  Visit Diagnosis: Acute bilateral low back pain with bilateral sciatica  Other symptoms and signs involving the musculoskeletal system  Weakness generalized  Stiffness of both hip joints     Problem List Patient Active Problem List   Diagnosis Date Noted   Osteoarthritis    Low back pain    Heart murmur    Exercise-induced asthma    Diabetes mellitus without complication (HCC)    Arthritis    Anemia    Myofascial pain syndrome, cervical 07/28/2019   Lateral epicondylitis of right elbow 07/28/2019   Nontraumatic incomplete tear of right rotator cuff 07/25/2019   Tendinopathy of right biceps tendon 07/25/2019   Erectile dysfunction 07/11/2019   Mild persistent asthma/cough variant asthma 04/13/2019   Allergic rhinitis with a possible nonallergic component 04/13/2019   Acid reflux 04/13/2019   Chronic cough 04/13/2019    Hoarseness 04/13/2019   Cervical radicular pain 01/10/2019   Ulnar neuropathy at elbow of right upper extremity 01/03/2019   Acute bilateral low back pain without sciatica 01/03/2019   Onychomycosis 05/25/2018   Palpitations 07/06/2017   CAD (coronary artery disease) 07/05/2017   Family history of early CAD 07/05/2017   Abnormal findings on diagnostic imaging of cardiovascular system 06/23/2017   Low vitamin D level 04/22/2017   Pain in right ankle and joints of right foot 10/05/2016   Hemarthrosis of right elbow 02/03/2016   Small thenar eminence 02/03/2016   Cervical disc disorder with radiculopathy of cervical region 10/07/2015   Incomplete rotator cuff tear 09/10/2015   Nonischemic cardiomyopathy ([place]) 05/15/2015   Fatigue 05/15/2015   Dyspnea on exertion 05/15/2015   Decreased cardiac ejection fraction 04/02/2015   Strain of latissimus dorsi muscle 01/21/2015   Shoulder bursitis 10/12/2014   Stenosis of lateral recess of lumbar spine 10/12/2014   BPPV (benign paroxysmal positional vertigo) 08/14/2014   Hyperlipidemia 01/19/2014   Anemia, unspecified 01/19/2014   ADD (attention deficit disorder) 01/19/2014   Type 2 diabetes mellitus with hyperglycemia, without long-term current use of insulin ([place]) 12/22/2013   Hypogonadism in male 12/22/2013   Arthritis of hand, left 12/22/2013   Kerin Perna, PTA 04/22/21 1:03 PM   [facility] [place] [address] [place], [place], [address] Phone: [phone]   Fax:  [phone]  Name: Reginald Rios MRN: [id number] Date of Birth: [birth year]

## 2021-04-25 ENCOUNTER — Ambulatory Visit: Payer: 59 | Admitting: Physical Therapy

## 2021-04-26 ENCOUNTER — Other Ambulatory Visit: Payer: Self-pay | Admitting: Family Medicine

## 2021-04-29 ENCOUNTER — Other Ambulatory Visit (HOSPITAL_BASED_OUTPATIENT_CLINIC_OR_DEPARTMENT_OTHER): Payer: Self-pay

## 2021-04-29 DIAGNOSIS — M9903 Segmental and somatic dysfunction of lumbar region: Secondary | ICD-10-CM | POA: Diagnosis not present

## 2021-04-29 DIAGNOSIS — M9904 Segmental and somatic dysfunction of sacral region: Secondary | ICD-10-CM | POA: Diagnosis not present

## 2021-04-29 DIAGNOSIS — M545 Low back pain, unspecified: Secondary | ICD-10-CM | POA: Diagnosis not present

## 2021-04-29 MED ORDER — PIOGLITAZONE HCL 45 MG PO TABS
45.0000 mg | ORAL_TABLET | Freq: Every day | ORAL | 5 refills | Status: DC
  Filled 2021-04-29: qty 30, 30d supply, fill #0
  Filled 2021-06-01: qty 30, 30d supply, fill #1
  Filled 2021-07-01: qty 30, 30d supply, fill #2
  Filled 2021-09-04: qty 30, 30d supply, fill #3
  Filled 2021-09-05: qty 30, 30d supply, fill #0
  Filled 2021-11-12: qty 30, 30d supply, fill #1
  Filled 2022-01-04: qty 30, 30d supply, fill #2
  Filled 2022-01-06: qty 30, 30d supply, fill #0

## 2021-04-30 ENCOUNTER — Other Ambulatory Visit (HOSPITAL_BASED_OUTPATIENT_CLINIC_OR_DEPARTMENT_OTHER): Payer: Self-pay

## 2021-05-02 DIAGNOSIS — H2513 Age-related nuclear cataract, bilateral: Secondary | ICD-10-CM | POA: Diagnosis not present

## 2021-05-02 DIAGNOSIS — H43811 Vitreous degeneration, right eye: Secondary | ICD-10-CM | POA: Diagnosis not present

## 2021-05-02 DIAGNOSIS — H31091 Other chorioretinal scars, right eye: Secondary | ICD-10-CM | POA: Diagnosis not present

## 2021-05-02 DIAGNOSIS — H26111 Localized traumatic opacities, right eye: Secondary | ICD-10-CM | POA: Diagnosis not present

## 2021-05-03 ENCOUNTER — Ambulatory Visit (INDEPENDENT_AMBULATORY_CARE_PROVIDER_SITE_OTHER): Payer: 59

## 2021-05-03 ENCOUNTER — Other Ambulatory Visit: Payer: Self-pay

## 2021-05-03 DIAGNOSIS — M545 Low back pain, unspecified: Secondary | ICD-10-CM

## 2021-05-03 DIAGNOSIS — G8929 Other chronic pain: Secondary | ICD-10-CM

## 2021-05-03 DIAGNOSIS — M2578 Osteophyte, vertebrae: Secondary | ICD-10-CM | POA: Diagnosis not present

## 2021-05-03 DIAGNOSIS — M48061 Spinal stenosis, lumbar region without neurogenic claudication: Secondary | ICD-10-CM | POA: Diagnosis not present

## 2021-05-04 HISTORY — PX: CATARACT EXTRACTION W/ INTRAOCULAR LENS IMPLANT: SHX1309

## 2021-05-07 NOTE — Progress Notes (Signed)
MRI shows spinal stenosis severe at L4-5 and medial at L3-4 and L5-S1.  Additionally you have pretty bad facet arthritis at L5-S1.  This can cause back pain. Additionally you have what looks to be potential for significant pinched nerves that left L5 and bilateral S1 nerves.  This can cause pain to go down your legs. You have several potential targets for injections based on the MRI.  We need to match this to where you are hurting today.  We will talk about this in more detail at your follow-up appointment on January 6 and proceed with injection planning with at that visit.

## 2021-05-09 ENCOUNTER — Ambulatory Visit: Payer: 59 | Admitting: Family Medicine

## 2021-05-09 ENCOUNTER — Other Ambulatory Visit: Payer: Self-pay

## 2021-05-09 VITALS — BP 118/78 | HR 84 | Ht 70.0 in | Wt 216.0 lb

## 2021-05-09 DIAGNOSIS — G8929 Other chronic pain: Secondary | ICD-10-CM | POA: Diagnosis not present

## 2021-05-09 DIAGNOSIS — M545 Low back pain, unspecified: Secondary | ICD-10-CM | POA: Diagnosis not present

## 2021-05-09 DIAGNOSIS — M4317 Spondylolisthesis, lumbosacral region: Secondary | ICD-10-CM | POA: Insufficient documentation

## 2021-05-09 DIAGNOSIS — M48061 Spinal stenosis, lumbar region without neurogenic claudication: Secondary | ICD-10-CM

## 2021-05-09 HISTORY — DX: Spondylolisthesis, lumbosacral region: M43.17

## 2021-05-09 NOTE — Progress Notes (Signed)
I, [name], LAT, ATC, am serving as scribe for Dr. Lynne Leader.  Reginald Rios is a 66 y.o. male who presents to [place] [facility] today for f/u of low back pain and L-spine MRI review.  He was last seen by Dr. Georgina Snell on 04/02/21 and noted improvement in his symptoms but con't to have LBP that moved more to the L side of his lower back and hip.  He has completed 13 PT sessions.  Today, pt reports he has been resting and putting on weight. Pt LBP has improved and has really enjoyed the aquatherapy. Pt has been doing "rolfing" w/ a provider in [place], which he feels is helping. No radiating pain weakness distally.  Mild numbness distally.  Diagnostic testing: L-spine MRI- 05/03/21; L-spine XR- 02/19/21  Pertinent review of systems: No fevers or chills  Relevant historical information: CAD.  Diabetes.  History of cervical fusion.   Exam:  BP 118/78    Pulse 84    Ht 5\' 10"  (1.778 m)    Wt 216 lb (98 kg)    SpO2 98%    BMI 30.99 kg/m  General: Well Developed, well nourished, and in no acute distress.   MSK: L-spine nontender midline.  Decreased lumbar motion especially to extension.  Lower extremity strength is intact.    Lab and Radiology Results   EXAM: MRI LUMBAR SPINE WITHOUT CONTRAST   TECHNIQUE: Multiplanar, multisequence MR imaging of the lumbar spine was performed. No intravenous contrast was administered.   COMPARISON:  Lumbar radiographs 02/19/2021.  Lumbar MRI 09/19/2016.   FINDINGS: Segmentation: Normal, the same numbering system used on the 2018 MRI.   Alignment: Grade 1 anterolisthesis of L5 on S1 is stable since 2018, 4-5 mm. Mild dextroconvex scoliosis.   Vertebrae: No marrow edema or evidence of acute osseous abnormality. Chronic degenerative endplate marrow signal changes but normal background bone marrow signal. Small benign L2 vertebral body hemangioma. Intact visible sacrum.   Conus medullaris and cauda equina:  Conus extends to the T12 level. No lower spinal cord or conus signal abnormality.   Paraspinal and other soft tissues: Chronic renal parapelvic cysts appear stable. Other visualized abdominal viscera and paraspinal soft tissues are within normal limits.   Disc levels:   Negative visible lower thoracic levels through T12-L1.   L1-L2: Disc desiccation, disc space loss and circumferential disc bulge with endplate spurring. Mild new epidural lipomatosis, but no significant stenosis.   L2-L3: Chronic disc space loss with circumferential disc osteophyte complex. Increased and moderate epidural lipomatosis. Subsequent mild new spinal stenosis (series 5, image 11). Mild facet hypertrophy and trace degenerative facet joint fluid on the left. Mild if any right L2 foraminal stenosis is stable.   L3-L4: Chronic disc space loss with circumferential disc osteophyte complex. Increased and moderate epidural lipomatosis. Mild to moderate facet and ligament flavum hypertrophy. Subsequent moderate spinal stenosis is new. No convincing foraminal stenosis.   L4-L5: Chronic disc space loss and bulky circumferential disc osteophyte complex eccentric to the left. Moderate facet and ligament flavum hypertrophy. New epidural lipomatosis. Subsequent severe spinal stenosis. Moderate to severe left lateral recess stenosis is chronic (left L5 nerve level). And moderate left, mild right L4 neural foraminal stenosis appears chronic and stable.   L5-S1: Chronic anterolisthesis with disc desiccation and circumferential disc/pseudo disc. New or increased midline annular fissure (series 5, image 30). Chronic moderate to severe facet and ligament flavum hypertrophy. Stable mild to moderate spinal stenosis at this level.  Chronic moderate to severe lateral recess stenosis (S1 nerve levels) not significantly changed. And moderate right greater than left L5 foraminal stenosis appears stable.   IMPRESSION: 1.  Increased epidural lipomatosis since a 2018 MRI superimposed on widespread chronic lumbar degeneration results in new or increased spinal stenosis throughout the lumbar spine, severe at L4-L5, and moderate at L3-L4 and L5-S1.   2. Progressed L5-S1 disc degeneration in the form of midline annular fissure. Chronic grade 1 spondylolisthesis and severe facet arthropathy at that level.   3. Chronic moderate to severe lateral recess stenosis at the left L5 and bilateral S1 nerve levels. And stable moderate L4 and L5 neural foraminal stenosis.     Electronically Signed   By: Genevie Ann M.D.   On: 05/06/2021 07:18 I, Lynne Leader, personally (independently) visualized and performed the interpretation of the images attached in this note.    Assessment and Plan: 66 y.o. male with axial low back pain worse with standing and walking.  This fits with spinal stenosis and spondylolisthesis seen on MRI as extension would make these issues worse. Plan to continue PT and home exercise program while avoiding severe extension based activity.  Spent a fair amount of time today discussing his MRI results and showing him his pictures and discussing with a mean.  Additionally reviewed potential next steps including likely facet injections L4-5 and L5-S1 and potential epidural steroid injections going forward.  Would start with facet injections if needed.  He will contact me and let me know what he needs in the future.  Total encounter time 30 minutes including face-to-face time with the patient and, reviewing past medical record, and charting on the date of service.   Planning as above     Discussed warning signs or symptoms. Please see discharge instructions. Patient expresses understanding.   The above documentation has been reviewed and is accurate and complete Lynne Leader, M.D.

## 2021-05-09 NOTE — Patient Instructions (Addendum)
Thank you for coming in today.   Continue PT and your home exercises.  Follow-up: as needed.   Let me know if you need back injections

## 2021-05-15 ENCOUNTER — Encounter: Payer: Self-pay | Admitting: Cardiology

## 2021-05-16 ENCOUNTER — Ambulatory Visit: Payer: 59 | Attending: Family Medicine | Admitting: Physical Therapy

## 2021-05-16 ENCOUNTER — Other Ambulatory Visit: Payer: Self-pay

## 2021-05-16 DIAGNOSIS — M6281 Muscle weakness (generalized): Secondary | ICD-10-CM | POA: Diagnosis not present

## 2021-05-16 DIAGNOSIS — M5441 Lumbago with sciatica, right side: Secondary | ICD-10-CM | POA: Diagnosis not present

## 2021-05-16 DIAGNOSIS — R29898 Other symptoms and signs involving the musculoskeletal system: Secondary | ICD-10-CM

## 2021-05-16 DIAGNOSIS — M5442 Lumbago with sciatica, left side: Secondary | ICD-10-CM | POA: Diagnosis not present

## 2021-05-16 DIAGNOSIS — M25651 Stiffness of right hip, not elsewhere classified: Secondary | ICD-10-CM

## 2021-05-16 DIAGNOSIS — M25652 Stiffness of left hip, not elsewhere classified: Secondary | ICD-10-CM

## 2021-05-16 DIAGNOSIS — M5432 Sciatica, left side: Secondary | ICD-10-CM | POA: Diagnosis present

## 2021-05-16 DIAGNOSIS — R531 Weakness: Secondary | ICD-10-CM

## 2021-05-16 NOTE — Therapy (Signed)
[place], [place], [address] Phone: [phone]   Fax:  [phone]  Physical Therapy Treatment  Patient Details  Name: Reginald Rios MRN: [id number] Date of Birth: [birth year] Referring Provider (PT): Dr Lynne Leader   Encounter Date: 05/16/2021   PT End of Session - 05/16/21 1322     Visit Number 14    Number of Visits 20    Date for PT Re-Evaluation 05/20/21    Authorization - Visit Number 14    Progress Note Due on Visit 20    PT Start Time 1315    PT Stop Time 5176    PT Time Calculation (min) 40 min    Activity Tolerance Patient tolerated treatment well    Behavior During Therapy [facility] for tasks assessed/performed             Past Medical History:  Diagnosis Date   ADD (attention deficit disorder)    Anemia    Arthritis    CAD (coronary artery disease)    2/19 PCI/DESx1 to Lcx   Diabetes mellitus without complication ([place])    Exercise-induced asthma    Heart murmur    Hyperlipidemia    family hx of high cholesterol   Hypogonadism in male    Low back pain    Osteoarthritis     Past Surgical History:  Procedure Laterality Date   CORONARY STENT INTERVENTION N/A 06/23/2017   Procedure: CORONARY STENT INTERVENTION;  Surgeon: Leonie Man, MD;  Location: [place] [facility];  Service: Cardiovascular;  Laterality: N/A;   INTRAVASCULAR PRESSURE WIRE/FFR STUDY N/A 06/23/2017   Procedure: INTRAVASCULAR PRESSURE WIRE/FFR STUDY;  Surgeon: Leonie Man, MD;  Location: [place] [facility];  Service: Cardiovascular;  Laterality: N/A;   KNEE ARTHROSCOPY     LEFT HEART CATH AND CORONARY ANGIOGRAPHY N/A 06/23/2017   Procedure: LEFT HEART CATH AND CORONARY ANGIOGRAPHY;  Surgeon: Leonie Man, MD;  Location: [place] [facility];  Service: Cardiovascular;  Laterality: N/A;   SHOULDER ARTHROSCOPY WITH BICEPSTENOTOMY Right 09/06/2019   Procedure: SHOULDER ARTHROSCOPY WITH BICEPSTENOTOMY;   Surgeon: Leandrew Koyanagi, MD;  Location: [place];  Service: Orthopedics;  Laterality: Right;   SHOULDER ARTHROSCOPY WITH SUBACROMIAL DECOMPRESSION Right 09/06/2019   Procedure: RIGHT SHOULDER ARTHROSCOPY WITH EXTENSIVE DEBRIDEMENT, SUBACROMIAL DECOMPRESSION, BICEPS TENOTOMY;  Surgeon: Leandrew Koyanagi, MD;  Location: [place];  Service: Orthopedics;  Laterality: Right;   TENOTOMY ACHILLES TENDON      There were no vitals filed for this visit.   Subjective Assessment - 05/16/21 1322     Subjective Pt reports that he had rolfing done yesterday; gave him some relief and he feels looser afterwards.  He had MRI of lumbar and follow up with Dr. Georgina Snell.  He would like copies of exercises to complete outside therapy session.  he voices concern over continued swelling in bilat ankles.    Patient Stated Goals get active again to prevent weight gain; play pickle ball    Currently in Pain? Yes    Pain Score 2     Pain Location Back    Pain Orientation Lower    Pain Descriptors / Indicators Aching;Tightness;Dull    Aggravating Factors  forward bending    Pain Relieving Factors TENS, rest               Pt seen for aquatic therapy today.  Treatment took place in water 3.25-4 ft in depth at  the [employer] pool. Temp of water was 93.  Pt entered/exited the pool via independently via stairs with single rail.  Treatment:   Holding onto edge of pool:  hip abdct x 12 each leg, hip ext x 15 each leg.  Heel raises x 10.  wall plank x 20 sec.  Wall push ups with / without push off x 10  Without UE support:  forward/ backward walking, with/without UE swinging.  Side stepping.  Walking lunge.  High knee marching.    Holding onto pool noodle:  curtsey lunges with knee to noodle (abdct) x 10 each   Transverse abd set with hands on yellow noodle, slightly submerging under water x 5 sec x 10, repeated with row with noodle - cues for engaging lower traps, too x 15.    Back supported float with noodle under arms, knees and nekdoodle on neck for decompression of spine x 5 min at end of session.     Pt requires buoyancy for support and to offload joints with strengthening exercises. Viscosity of the water is needed for resistance of strengthening; water current perturbations provides challenge to standing balance unsupported, requiring increased core activation.       PT Long Term Goals - 04/08/21 0939       PT LONG TERM GOAL #1   Title Patient to be independent with advanced HEP.    Time 6    Period Weeks    Status On-going    Target Date 05/20/21      PT LONG TERM GOAL #2   Title Improve trunk and LE mobility/ROM/tissue extensibility demonstrated by increased lumbar extension to 15-20% with no pain    Time 6    Period Weeks    Status New    Target Date 05/20/21      PT LONG TERM GOAL #3   Title Patient reports return to normal functional and recreational activities with minimal to no increase in pain > 1-2/10 pain    Time 6    Period Weeks    Status On-going    Target Date 05/20/21      PT LONG TERM GOAL #4   Title Patient reports return to pickleball at level prior to injury    Time 6    Period Weeks    Status On-going    Target Date 05/20/21      PT LONG TERM GOAL #5   Title Improve functional limitation score to 49    Time 6    Period Weeks    Status On-going    Target Date 05/20/21                   Plan - 05/16/21 1334     Clinical Impression Statement Pt requires continued cues for upright posture during session (often forward flexed in trunk at hips).  He tolerated aquatic exercises well without increase in pain. Most relief reported with high knee marching and supported floating.  Reviewed HEP from previous water session. Pt interested in another session for progression of exercises on land.     Rehab Potential Good    PT Frequency 2x / week    PT Duration 6 weeks    PT Treatment/Interventions ADLs/Self  Care Home Management;Aquatic Therapy;Cryotherapy;Electrical Stimulation;Iontophoresis 4mg /ml Dexamethasone;Moist Heat;Traction;Ultrasound;Functional mobility training;Therapeutic activities;Therapeutic exercise;Balance training;Neuromuscular re-education;Patient/family education;Manual techniques;Passive range of motion;Dry needling;Taping    PT Next Visit Plan core stabilization; LE/back stretches; progress HEP; manual work including lumbar mobs if indicated;  Assess goals for  Recert.     PT Home Exercise Plan [email or web address]    Consulted and Agree with Plan of Care Patient             Patient will benefit from skilled therapeutic intervention in order to improve the following deficits and impairments:  Abnormal gait, Difficulty walking, Decreased activity tolerance, Pain, Hypomobility, Impaired flexibility, Improper body mechanics, Decreased mobility, Decreased strength, Postural dysfunction, Impaired sensation  Visit Diagnosis: Acute bilateral low back pain with bilateral sciatica  Other symptoms and signs involving the musculoskeletal system  Weakness generalized  Stiffness of both hip joints     Problem List Patient Active Problem List   Diagnosis Date Noted   Spondylolisthesis at L5-S1 level 05/09/2021   Osteoarthritis    Low back pain    Heart murmur    Exercise-induced asthma    Diabetes mellitus without complication (HCC)    Arthritis    Anemia    Myofascial pain syndrome, cervical 07/28/2019   Lateral epicondylitis of right elbow 07/28/2019   Nontraumatic incomplete tear of right rotator cuff 07/25/2019   Tendinopathy of right biceps tendon 07/25/2019   Erectile dysfunction 07/11/2019   Mild persistent asthma/cough variant asthma 04/13/2019   Allergic rhinitis with a possible nonallergic component 04/13/2019   Acid reflux 04/13/2019   Chronic cough 04/13/2019   Hoarseness 04/13/2019   Cervical radicular pain 01/10/2019   Ulnar neuropathy at elbow of right upper  extremity 01/03/2019   Acute bilateral low back pain without sciatica 01/03/2019   Onychomycosis 05/25/2018   Palpitations 07/06/2017   CAD (coronary artery disease) 07/05/2017   Family history of early CAD 07/05/2017   Abnormal findings on diagnostic imaging of cardiovascular system 06/23/2017   Low vitamin D level 04/22/2017   Pain in right ankle and joints of right foot 10/05/2016   Hemarthrosis of right elbow 02/03/2016   Small thenar eminence 02/03/2016   Cervical disc disorder with radiculopathy of cervical region 10/07/2015   Incomplete rotator cuff tear 09/10/2015   Nonischemic cardiomyopathy ([place]) 05/15/2015   Fatigue 05/15/2015   Dyspnea on exertion 05/15/2015   Decreased cardiac ejection fraction 04/02/2015   Strain of latissimus dorsi muscle 01/21/2015   Shoulder bursitis 10/12/2014   Spinal stenosis, lumbar region, without neurogenic claudication 10/12/2014   BPPV (benign paroxysmal positional vertigo) 08/14/2014   Hyperlipidemia 01/19/2014   Anemia, unspecified 01/19/2014   ADD (attention deficit disorder) 01/19/2014   Type 2 diabetes mellitus with hyperglycemia, without long-term current use of insulin ([place]) 12/22/2013   Hypogonadism in male 12/22/2013   Arthritis of hand, left 12/22/2013   Kerin Perna, PTA 05/16/21 1:51 PM  [facility] [place], [place], [address] Phone: [phone]   Fax:  [phone]  Name: Reginald Rios MRN: [id number] Date of Birth: 1955/12/18

## 2021-05-23 ENCOUNTER — Other Ambulatory Visit: Payer: Self-pay

## 2021-05-23 ENCOUNTER — Ambulatory Visit: Payer: 59 | Admitting: Physical Therapy

## 2021-05-23 DIAGNOSIS — R531 Weakness: Secondary | ICD-10-CM

## 2021-05-23 DIAGNOSIS — M5441 Lumbago with sciatica, right side: Secondary | ICD-10-CM | POA: Diagnosis not present

## 2021-05-23 DIAGNOSIS — M5442 Lumbago with sciatica, left side: Secondary | ICD-10-CM | POA: Diagnosis not present

## 2021-05-23 DIAGNOSIS — R29898 Other symptoms and signs involving the musculoskeletal system: Secondary | ICD-10-CM

## 2021-05-23 DIAGNOSIS — M6281 Muscle weakness (generalized): Secondary | ICD-10-CM | POA: Diagnosis not present

## 2021-05-23 NOTE — Patient Instructions (Signed)
Access Code: [email or web address] URL: [email or web address] Date: 05/23/2021 Prepared by: Isabelle Course  Exercises Standing Back Extension - 5-8 x daily - 3 x weekly Wall Quarter Squat - 1 x daily - 3 x weekly - 1 sets - 10 reps - 5 seconds hold Supine Hip Internal and External Rotation - 1 x daily - 3 x weekly - 1 sets - 2-3 reps - 5-10 seconds hold Doorway Pec Stretch at 120 Degrees Abduction - 1 x daily - 3 x weekly - 1 sets - 2 reps - 20 seconds hold Anti-Rotation Lateral Stepping with Press - 2 x daily - 3 x weekly - 1-2 sets - 10 reps - 2-3 sec hold Seated Hip Flexor Stretch - 2 x daily - 3 x weekly - 1 sets - 3 reps - 30 sec hold Standing Toe Dorsiflexion Stretch - 2 x daily - 3 x weekly - 1 sets - 3 reps - 20-30 sec hold Gastroc Stretch on Wall - 2 x daily - 3 x weekly - 1 sets - 3 reps - 30 sec hold Soleus Stretch on Wall - 2 x daily - 3 x weekly - 1 sets - 3 reps - 30 sec hold Drawing Bow - 1 x daily - 3 x weekly - 2 sets - 10 reps

## 2021-05-23 NOTE — Therapy (Signed)
[place], [place], [address] Phone: [phone]   Fax:  [phone]  Physical Therapy Treatment and Recertification  Patient Details  Name: Reginald Rios MRN: [id number] Date of Birth: 1956/04/09 Referring Provider (PT): Dr Lynne Leader   Encounter Date: 05/23/2021   PT End of Session - 05/23/21 1529     Visit Number 15    Number of Visits 21    Date for PT Re-Evaluation 07/04/21    Authorization - Visit Number 15    Progress Note Due on Visit 20    PT Start Time 5643    PT Stop Time 3295    PT Time Calculation (min) 45 min    Activity Tolerance Patient tolerated treatment well    Behavior During Therapy [facility] for tasks assessed/performed             Past Medical History:  Diagnosis Date   ADD (attention deficit disorder)    Anemia    Arthritis    CAD (coronary artery disease)    2/19 PCI/DESx1 to Lcx   Diabetes mellitus without complication ([place])    Exercise-induced asthma    Heart murmur    Hyperlipidemia    family hx of high cholesterol   Hypogonadism in male    Low back pain    Osteoarthritis     Past Surgical History:  Procedure Laterality Date   CORONARY STENT INTERVENTION N/A 06/23/2017   Procedure: CORONARY STENT INTERVENTION;  Surgeon: Leonie Man, MD;  Location: [place] [facility];  Service: Cardiovascular;  Laterality: N/A;   INTRAVASCULAR PRESSURE WIRE/FFR STUDY N/A 06/23/2017   Procedure: INTRAVASCULAR PRESSURE WIRE/FFR STUDY;  Surgeon: Leonie Man, MD;  Location: [place] [facility];  Service: Cardiovascular;  Laterality: N/A;   KNEE ARTHROSCOPY     LEFT HEART CATH AND CORONARY ANGIOGRAPHY N/A 06/23/2017   Procedure: LEFT HEART CATH AND CORONARY ANGIOGRAPHY;  Surgeon: Leonie Man, MD;  Location: [place] [facility];  Service: Cardiovascular;  Laterality: N/A;   SHOULDER ARTHROSCOPY WITH BICEPSTENOTOMY Right 09/06/2019   Procedure: SHOULDER ARTHROSCOPY  WITH BICEPSTENOTOMY;  Surgeon: Leandrew Koyanagi, MD;  Location: [place];  Service: Orthopedics;  Laterality: Right;   SHOULDER ARTHROSCOPY WITH SUBACROMIAL DECOMPRESSION Right 09/06/2019   Procedure: RIGHT SHOULDER ARTHROSCOPY WITH EXTENSIVE DEBRIDEMENT, SUBACROMIAL DECOMPRESSION, BICEPS TENOTOMY;  Surgeon: Leandrew Koyanagi, MD;  Location: [place];  Service: Orthopedics;  Laterality: Right;   TENOTOMY ACHILLES TENDON      There were no vitals filed for this visit.   Subjective Assessment - 05/23/21 1450     Subjective Pt reports he played pickleball last night and "it was great". He did not have pain while playing but had pain 6/10 this morning. He was able to take a hot shower and use a self massager and pain is now 3/10    Patient Stated Goals get active again to prevent weight gain; play pickle ball    Currently in Pain? Yes    Pain Score 3     Pain Location Back    Pain Orientation Lower    Pain Descriptors / Indicators Aching;Sore                [facility] PT Assessment - 05/23/21 0001       Assessment   Medical Diagnosis LBP with Lt > Rt LE radicular pain    Referring Provider (PT) Dr Lynne Leader    Onset Date/Surgical  Date 02/05/21    Hand Dominance Right    Prior Therapy here for back; neck; LE's      AROM   Lumbar Flexion 75%    Lumbar Extension 10%    Lumbar - Right Side Bend 50%    Lumbar - Left Side Bend 60%    Lumbar - Right Rotation WFL    Lumbar - Left Rotation WFL                           [facility] Adult PT Treatment/Exercise - 05/23/21 0001       Lumbar Exercises: Stretches   Hip Flexor Stretch Right;Left;2 reps;20 seconds   seated, leg back, arm overhead.   Figure 4 Stretch Limitations 20 sec bilat seated    Gastroc Stretch Right;Left;2 reps;20 seconds    Other Lumbar Stretch Exercise lower trunk rotation x 10 bilat      Lumbar Exercises: Aerobic   Nustep L5 x 5 min for warm up      Lumbar Exercises:  Standing   Other Standing Lumbar Exercises resisted walking green TB 5 x 5 backward and laterally both sides    Other Standing Lumbar Exercises pallof press x 10 bilat red TB, bow and arrow green TB x 10 bilat                     PT Education - 05/23/21 1525     Education Details updated HEP    Person(s) Educated Patient    Methods Explanation;Demonstration;Handout    Comprehension Verbalized understanding;Returned demonstration                 PT Long Term Goals - 05/23/21 1534       PT LONG TERM GOAL #1   Title Patient to be independent with advanced HEP.    Status On-going    Target Date 07/04/21      PT LONG TERM GOAL #2   Title Improve trunk and LE mobility/ROM/tissue extensibility demonstrated by increased lumbar extension to 15-20% with no pain    Time 6    Period Weeks    Status On-going    Target Date 07/04/21      PT LONG TERM GOAL #3   Title Patient reports return to normal functional and recreational activities with minimal to no increase in pain > 1-2/10 pain    Baseline 6/10    Time 6    Period Weeks    Status On-going    Target Date 07/04/21      PT LONG TERM GOAL #4   Title Patient reports return to pickleball at level prior to injury    Time 6    Period Weeks    Status On-going    Target Date 07/04/21      PT LONG TERM GOAL #5   Title Improve functional limitation score to 49    Status On-going    Target Date 07/04/21                   Plan - 05/23/21 1530     Clinical Impression Statement Pt is improving ROM and mm strength and endurance. He continues with decreased flexibility and increased pain with recreation activities. Pt will benefit from continued skilled PT to address deficits and progress towards remaining goals    Rehab Potential Good    PT Frequency 1x / week    PT Duration 6 weeks  PT Treatment/Interventions ADLs/Self Care Home Management;Aquatic Therapy;Cryotherapy;Electrical  Stimulation;Iontophoresis 4mg /ml Dexamethasone;Moist Heat;Traction;Ultrasound;Functional mobility training;Therapeutic activities;Therapeutic exercise;Balance training;Neuromuscular re-education;Patient/family education;Manual techniques;Passive range of motion;Dry needling;Taping    PT Next Visit Plan core strength and endurance, assess HEP    PT Home Exercise Plan [email or web address]    Consulted and Agree with Plan of Care Patient             Patient will benefit from skilled therapeutic intervention in order to improve the following deficits and impairments:  Abnormal gait, Difficulty walking, Decreased activity tolerance, Pain, Hypomobility, Impaired flexibility, Improper body mechanics, Decreased mobility, Decreased strength, Postural dysfunction, Impaired sensation  Visit Diagnosis: Acute bilateral low back pain with bilateral sciatica - Plan: PT plan of care cert/re-cert  Other symptoms and signs involving the musculoskeletal system - Plan: PT plan of care cert/re-cert  Weakness generalized - Plan: PT plan of care cert/re-cert     Problem List Patient Active Problem List   Diagnosis Date Noted   Spondylolisthesis at L5-S1 level 05/09/2021   Osteoarthritis    Low back pain    Heart murmur    Exercise-induced asthma    Diabetes mellitus without complication (HCC)    Arthritis    Anemia    Myofascial pain syndrome, cervical 07/28/2019   Lateral epicondylitis of right elbow 07/28/2019   Nontraumatic incomplete tear of right rotator cuff 07/25/2019   Tendinopathy of right biceps tendon 07/25/2019   Erectile dysfunction 07/11/2019   Mild persistent asthma/cough variant asthma 04/13/2019   Allergic rhinitis with a possible nonallergic component 04/13/2019   Acid reflux 04/13/2019   Chronic cough 04/13/2019   Hoarseness 04/13/2019   Cervical radicular pain 01/10/2019   Ulnar neuropathy at elbow of right upper extremity 01/03/2019   Acute bilateral low back pain without sciatica  01/03/2019   Onychomycosis 05/25/2018   Palpitations 07/06/2017   CAD (coronary artery disease) 07/05/2017   Family history of early CAD 07/05/2017   Abnormal findings on diagnostic imaging of cardiovascular system 06/23/2017   Low vitamin D level 04/22/2017   Pain in right ankle and joints of right foot 10/05/2016   Hemarthrosis of right elbow 02/03/2016   Small thenar eminence 02/03/2016   Cervical disc disorder with radiculopathy of cervical region 10/07/2015   Incomplete rotator cuff tear 09/10/2015   Nonischemic cardiomyopathy ([place]) 05/15/2015   Fatigue 05/15/2015   Dyspnea on exertion 05/15/2015   Decreased cardiac ejection fraction 04/02/2015   Strain of latissimus dorsi muscle 01/21/2015   Shoulder bursitis 10/12/2014   Spinal stenosis, lumbar region, without neurogenic claudication 10/12/2014   BPPV (benign paroxysmal positional vertigo) 08/14/2014   Hyperlipidemia 01/19/2014   Anemia, unspecified 01/19/2014   ADD (attention deficit disorder) 01/19/2014   Type 2 diabetes mellitus with hyperglycemia, without long-term current use of insulin ([place]) 12/22/2013   Hypogonadism in male 12/22/2013   Arthritis of hand, left 12/22/2013    Sabastian Raimondi, PT 05/23/2021, 3:38 PM  [place], [place], [address] Phone: [phone]   Fax:  [phone]  Name: Reginald Rios MRN: [id number] Date of Birth: April 18, 1956

## 2021-06-01 ENCOUNTER — Other Ambulatory Visit: Payer: Self-pay | Admitting: Family Medicine

## 2021-06-01 ENCOUNTER — Other Ambulatory Visit: Payer: Self-pay | Admitting: Cardiology

## 2021-06-02 ENCOUNTER — Other Ambulatory Visit (HOSPITAL_BASED_OUTPATIENT_CLINIC_OR_DEPARTMENT_OTHER): Payer: Self-pay

## 2021-06-02 MED ORDER — METHYLPHENIDATE HCL 20 MG PO TABS
20.0000 mg | ORAL_TABLET | Freq: Two times a day (BID) | ORAL | 0 refills | Status: DC
  Filled 2021-06-02: qty 60, 30d supply, fill #0

## 2021-06-02 NOTE — Telephone Encounter (Signed)
Last OV--04/21/2021 Last RF--#60 no refills on 02/15/2021 [facility] last updated on 04/16/2020

## 2021-06-03 ENCOUNTER — Other Ambulatory Visit (HOSPITAL_BASED_OUTPATIENT_CLINIC_OR_DEPARTMENT_OTHER): Payer: Self-pay

## 2021-06-03 DIAGNOSIS — H35363 Drusen (degenerative) of macula, bilateral: Secondary | ICD-10-CM | POA: Diagnosis not present

## 2021-06-03 DIAGNOSIS — H53141 Visual discomfort, right eye: Secondary | ICD-10-CM | POA: Diagnosis not present

## 2021-06-03 DIAGNOSIS — E119 Type 2 diabetes mellitus without complications: Secondary | ICD-10-CM | POA: Diagnosis not present

## 2021-06-03 DIAGNOSIS — H35371 Puckering of macula, right eye: Secondary | ICD-10-CM | POA: Diagnosis not present

## 2021-06-03 DIAGNOSIS — H25813 Combined forms of age-related cataract, bilateral: Secondary | ICD-10-CM | POA: Diagnosis not present

## 2021-06-03 MED ORDER — LOSARTAN POTASSIUM 25 MG PO TABS
25.0000 mg | ORAL_TABLET | Freq: Every day | ORAL | 0 refills | Status: DC
  Filled 2021-06-03: qty 30, 30d supply, fill #0

## 2021-06-05 NOTE — Progress Notes (Deleted)
° °  I, [name], LAT, ATC, am serving as scribe for Dr. Lynne Leader.  Reginald Rios is a 66 y.o. male who presents to [place] [facility] today for L knee pain.  He was last seen by Dr. Georgina Snell on 05/09/21 for f/u of low back pain and L-spine MRI review.  He was advised to con't PT and has now completed 15 sessions.  Today, pt reports L knee pain since .  He locates his pain to .  L knee swelling: L knee mechanical symptoms: Aggravating factors: Treatments tried:    Pertinent review of systems: ***  Relevant historical information: ***   Exam:  There were no vitals taken for this visit. General: Well Developed, well nourished, and in no acute distress.   MSK: ***    Lab and Radiology Results No results found for this or any previous visit (from the past 72 hour(s)). No results found.     Assessment and Plan: 66 y.o. male with ***   PDMP not reviewed this encounter. No orders of the defined types were placed in this encounter.  No orders of the defined types were placed in this encounter.    Discussed warning signs or symptoms. Please see discharge instructions. Patient expresses understanding.   ***

## 2021-06-06 ENCOUNTER — Encounter: Payer: Self-pay | Admitting: Physical Therapy

## 2021-06-06 ENCOUNTER — Other Ambulatory Visit: Payer: Self-pay

## 2021-06-06 ENCOUNTER — Ambulatory Visit: Payer: 59 | Attending: Family Medicine | Admitting: Physical Therapy

## 2021-06-06 DIAGNOSIS — M25652 Stiffness of left hip, not elsewhere classified: Secondary | ICD-10-CM | POA: Insufficient documentation

## 2021-06-06 DIAGNOSIS — R531 Weakness: Secondary | ICD-10-CM | POA: Diagnosis not present

## 2021-06-06 DIAGNOSIS — R29898 Other symptoms and signs involving the musculoskeletal system: Secondary | ICD-10-CM | POA: Diagnosis not present

## 2021-06-06 DIAGNOSIS — M5441 Lumbago with sciatica, right side: Secondary | ICD-10-CM | POA: Diagnosis not present

## 2021-06-06 DIAGNOSIS — M5442 Lumbago with sciatica, left side: Secondary | ICD-10-CM | POA: Insufficient documentation

## 2021-06-06 DIAGNOSIS — M25651 Stiffness of right hip, not elsewhere classified: Secondary | ICD-10-CM | POA: Insufficient documentation

## 2021-06-06 NOTE — Therapy (Signed)
[name], [place], [address] Phone: [phone]   Fax:  [phone]  Physical Therapy Treatment  Patient Details  Name: Reginald Rios MRN: [id number] Date of Birth: 23-Aug-1955 Referring Provider (PT): Dr Lynne Leader   Encounter Date: 06/06/2021   PT End of Session - 06/06/21 1401     Visit Number 16    Number of Visits 21    Date for PT Re-Evaluation 07/04/21    Progress Note Due on Visit 64    PT Start Time 8938    PT Stop Time 1017    PT Time Calculation (min) 45 min    Activity Tolerance Patient tolerated treatment well    Behavior During Therapy [facility] for tasks assessed/performed             Past Medical History:  Diagnosis Date   ADD (attention deficit disorder)    Anemia    Arthritis    CAD (coronary artery disease)    2/19 PCI/DESx1 to Lcx   Diabetes mellitus without complication ([place])    Exercise-induced asthma    Heart murmur    Hyperlipidemia    family hx of high cholesterol   Hypogonadism in male    Low back pain    Osteoarthritis     Past Surgical History:  Procedure Laterality Date   CORONARY STENT INTERVENTION N/A 06/23/2017   Procedure: CORONARY STENT INTERVENTION;  Surgeon: Leonie Man, MD;  Location: [place] [facility];  Service: Cardiovascular;  Laterality: N/A;   INTRAVASCULAR PRESSURE WIRE/FFR STUDY N/A 06/23/2017   Procedure: INTRAVASCULAR PRESSURE WIRE/FFR STUDY;  Surgeon: Leonie Man, MD;  Location: [place] [facility];  Service: Cardiovascular;  Laterality: N/A;   KNEE ARTHROSCOPY     LEFT HEART CATH AND CORONARY ANGIOGRAPHY N/A 06/23/2017   Procedure: LEFT HEART CATH AND CORONARY ANGIOGRAPHY;  Surgeon: Leonie Man, MD;  Location: [place] [facility];  Service: Cardiovascular;  Laterality: N/A;   SHOULDER ARTHROSCOPY WITH BICEPSTENOTOMY Right 09/06/2019   Procedure: SHOULDER ARTHROSCOPY WITH BICEPSTENOTOMY;  Surgeon: Leandrew Koyanagi, MD;  Location:  [place];  Service: Orthopedics;  Laterality: Right;   SHOULDER ARTHROSCOPY WITH SUBACROMIAL DECOMPRESSION Right 09/06/2019   Procedure: RIGHT SHOULDER ARTHROSCOPY WITH EXTENSIVE DEBRIDEMENT, SUBACROMIAL DECOMPRESSION, BICEPS TENOTOMY;  Surgeon: Leandrew Koyanagi, MD;  Location: [place];  Service: Orthopedics;  Laterality: Right;   TENOTOMY ACHILLES TENDON      There were no vitals filed for this visit.   Subjective Assessment - 06/06/21 1403     Subjective Pt reports he "blew his knee out" on a leg press machine, the day before pickle ball games.  "I had a twinge of pain and it made a funny noise".  He is scheduled to see Dr for this on Monday.  He brought his HEP folder to get help creating a routine.  Pt reports his back is "feeling really great". He is getting into pool 1x/wk.    Patient Stated Goals get active again to prevent weight gain; play pickle ball    Currently in Pain? Yes    Pain Score 2     Pain Location Back    Pain Orientation Lower;Right    Pain Descriptors / Indicators Dull;Aching    Aggravating Factors  forward bending    Pain Relieving Factors TENS, rest                [facility] PT Assessment - 06/06/21 0001  Assessment   Medical Diagnosis LBP with Lt > Rt LE radicular pain    Referring Provider (PT) Dr Lynne Leader    Onset Date/Surgical Date 02/05/21    Hand Dominance Right    Prior Therapy here for back; neck; LE's      Observation/Other Assessments   Focus on Therapeutic Outcomes (FOTO)  66                [facility] Adult PT Treatment/Exercise - 06/06/21 0001       Lumbar Exercises: Stretches   Lower Trunk Rotation 3 reps;10 seconds   arms i nT   Lower Trunk Rotation Limitations and wide feet x 3 reps    Hip Flexor Stretch Right;Left;2 reps;20 seconds   seated, leg back, arm overhead.   Piriformis Stretch Left;Right;2 reps;20 seconds   travell   Gastroc Stretch Right;Left;2 reps;20 seconds    Other Lumbar Stretch  Exercise rolling through toes for increased toe ext    Other Lumbar Stretch Exercise -- doorway stretch (middle) x 20 sec x 2     Lumbar Exercises: Aerobic   Nustep L5 x 5 min for warm up    Other Aerobic Exercise single laps around gym to assess response to exercises and decrease stiffness.      Lumbar Exercises: Standing   Wall Slides 10 reps;5 seconds   quarter squat   Row Strengthening;Both;20 reps    Theraband Level (Row) Level 3 (Green)    Other Standing Lumbar Exercises pallof press x 10 bilat red TB              PT Long Term Goals - 05/23/21 1534       PT LONG TERM GOAL #1   Title Patient to be independent with advanced HEP.    Status On-going    Target Date 07/04/21      PT LONG TERM GOAL #2   Title Improve trunk and LE mobility/ROM/tissue extensibility demonstrated by increased lumbar extension to 15-20% with no pain    Time 6    Period Weeks    Status On-going    Target Date 07/04/21      PT LONG TERM GOAL #3   Title Patient reports return to normal functional and recreational activities with minimal to no increase in pain > 1-2/10 pain    Baseline 6/10    Time 6    Period Weeks    Status On-going    Target Date 07/04/21      PT LONG TERM GOAL #4   Title Patient reports return to pickleball at level prior to injury    Time 6    Period Weeks    Status Partially Met    Target Date 07/04/21      PT LONG TERM GOAL #5   Title Improve functional limitation score to 49    Status Achieved    Target Date 07/04/21                   Plan - 06/06/21 1601     Clinical Impression Statement Consolidated HEP folder for pt and split exercises into 2 programs; floor exercises (stretches) and standing exercises.  He required minor cues on form of exercises.  Pt reported decrease in LBP with Travell stretch in supine.  Pt making progress towards remaining LTGs. Has met LTG#5   Examination-Activity Limitations Carry    Rehab Potential Good    PT Frequency  1x / week    PT Duration 6  weeks    PT Treatment/Interventions ADLs/Self Care Home Management;Aquatic Therapy;Cryotherapy;Electrical Stimulation;Iontophoresis 25m/ml Dexamethasone;Moist Heat;Traction;Ultrasound;Functional mobility training;Therapeutic activities;Therapeutic exercise;Balance training;Neuromuscular re-education;Patient/family education;Manual techniques;Passive range of motion;Dry needling;Taping    PT Next Visit Plan core strength and endurance, assess HEP    PT Home Exercise Plan [email or web address]    Consulted and Agree with Plan of Care Patient             Patient will benefit from skilled therapeutic intervention in order to improve the following deficits and impairments:  Abnormal gait, Difficulty walking, Decreased activity tolerance, Pain, Hypomobility, Impaired flexibility, Improper body mechanics, Decreased mobility, Decreased strength, Postural dysfunction, Impaired sensation  Visit Diagnosis: Acute bilateral low back pain with bilateral sciatica  Other symptoms and signs involving the musculoskeletal system  Weakness generalized  Stiffness of both hip joints     Problem List Patient Active Problem List   Diagnosis Date Noted   Spondylolisthesis at L5-S1 level 05/09/2021   Osteoarthritis    Low back pain    Heart murmur    Exercise-induced asthma    Diabetes mellitus without complication (HCC)    Arthritis    Anemia    Myofascial pain syndrome, cervical 07/28/2019   Lateral epicondylitis of right elbow 07/28/2019   Nontraumatic incomplete tear of right rotator cuff 07/25/2019   Tendinopathy of right biceps tendon 07/25/2019   Erectile dysfunction 07/11/2019   Mild persistent asthma/cough variant asthma 04/13/2019   Allergic rhinitis with a possible nonallergic component 04/13/2019   Acid reflux 04/13/2019   Chronic cough 04/13/2019   Hoarseness 04/13/2019   Cervical radicular pain 01/10/2019   Ulnar neuropathy at elbow of right upper extremity  01/03/2019   Acute bilateral low back pain without sciatica 01/03/2019   Onychomycosis 05/25/2018   Palpitations 07/06/2017   CAD (coronary artery disease) 07/05/2017   Family history of early CAD 07/05/2017   Abnormal findings on diagnostic imaging of cardiovascular system 06/23/2017   Low vitamin D level 04/22/2017   Pain in right ankle and joints of right foot 10/05/2016   Hemarthrosis of right elbow 02/03/2016   Small thenar eminence 02/03/2016   Cervical disc disorder with radiculopathy of cervical region 10/07/2015   Incomplete rotator cuff tear 09/10/2015   Nonischemic cardiomyopathy ([place] 05/15/2015   Fatigue 05/15/2015   Dyspnea on exertion 05/15/2015   Decreased cardiac ejection fraction 04/02/2015   Strain of latissimus dorsi muscle 01/21/2015   Shoulder bursitis 10/12/2014   Spinal stenosis, lumbar region, without neurogenic claudication 10/12/2014   BPPV (benign paroxysmal positional vertigo) 08/14/2014   Hyperlipidemia 01/19/2014   Anemia, unspecified 01/19/2014   ADD (attention deficit disorder) 01/19/2014   Type 2 diabetes mellitus with hyperglycemia, without long-term current use of insulin ([place] 12/22/2013   Hypogonadism in male 12/22/2013   Arthritis of hand, left 12/22/2013   .JKerin Perna PTA 06/06/21 4:49 PM  [address] [address]Phone: [phone]  Fax:  [phone] Name: Reginald JOHANSONMRN: [id number] of Birth: 104/17/1957

## 2021-06-06 NOTE — Patient Instructions (Signed)
Access Code: [email or web address] URL: [email or web address] Date: 06/06/2021 Prepared by: [place]  Exercises  Standing Plank on Wall with Reaches - 1 x daily - 3 x weekly - 1 sets - 5-10 reps Standing Row with Anchored Resistance - 1 x daily - 3 x weekly - 2 sets - 10 reps Wall Quarter Squat - 1 x daily - 3 x weekly - 1 sets - 10 reps - 5 seconds hold Anti-Rotation Lateral Stepping with Press - 2 x daily - 3 x weekly - 1-2 sets - 10 reps - 2-3 sec hold Gastroc Stretch on Wall - 2 x daily - 7 x weekly - 1 sets - 3 reps - 30 sec hold   Doorway Pec Stretch at 120 Degrees Abduction - 1 x daily - 7 x weekly - 1 sets - 2 reps - 20 seconds hold Seated Hip Flexor Stretch - 2 x daily - 7 x weekly - 1 sets - 3 reps - 30 sec hold Prone Quadriceps Stretch with Strap - 1 x daily - 3 x weekly - 1 sets - 2 reps - 20 hold Hooklying Hamstring Stretch with Strap - 1 x daily - 3 x weekly - 3 sets - 10 reps Hip Adductors and Hamstring Stretch with Strap - 1 x daily - 3 x weekly - 1 sets - 3 reps - 15-30 hold Supine Piriformis Stretch with Leg Straight - 1 x daily - 3 x weekly - 1 sets - 2-3 reps - 15-30 hold Supine Hip Internal and External Rotation - 1 x daily - 3 x weekly - 1 sets - 2-3 reps - 5-10 seconds hold

## 2021-06-09 ENCOUNTER — Ambulatory Visit: Payer: 59 | Admitting: Family Medicine

## 2021-06-09 NOTE — Progress Notes (Deleted)
° °  I, [name], LAT, ATC, am serving as scribe for Dr. Lynne Leader.  Reginald Rios is a 66 y.o. male who presents to [place] at [facility] today for L knee pain.  He was last seen by Dr. Georgina Snell on 05/09/21 for f/u of low back pain and he was encouraged to con't PT of which he's completed 16 sessions.  Today, he reports L knee pain x .  He locates his pain to .  L knee swelling: yes L knee mechanical symptoms: yes Aggravating factors: Treatments tried:   Pertinent review of systems: ***  Relevant historical information: ***   Exam:  There were no vitals taken for this visit. General: Well Developed, well nourished, and in no acute distress.   MSK: ***    Lab and Radiology Results No results found for this or any previous visit (from the past 72 hour(s)). No results found.     Assessment and Plan: 66 y.o. male with ***   PDMP not reviewed this encounter. No orders of the defined types were placed in this encounter.  No orders of the defined types were placed in this encounter.    Discussed warning signs or symptoms. Please see discharge instructions. Patient expresses understanding.   ***

## 2021-06-10 ENCOUNTER — Ambulatory Visit: Payer: 59 | Admitting: Family Medicine

## 2021-06-10 NOTE — Progress Notes (Signed)
I, [name], LAT, ATC, am serving as scribe for Dr. Lynne Leader.  Reginald Rios is a 66 y.o. male who presents to [place] at [facility] today for new onset L knee pain and swelling.  He was last seen by Dr. Georgina Snell on 05/09/21 for f/u of chronic L-sided LBP and was advised to con't PT of which he's completed 16 sessions.  Today, pt reports L knee pain and swelling x 2 weeks.  He has been getting back into the gym and was on the leg press and noticed pain and clicking in his L knee while doing the leg press.  He then noticed swelling in his knee.  He locates his pain to his L medial knee w/ some radiating pain into his lower leg.  L knee swelling: yes L knee mechanical symptoms: yes Aggravating factors: L knee flexion AROM; transitioning from sitting-to-standing; driving; rotational activities Treatments tried: ice; Voltaren gel   Pertinent review of systems: No fevers or chills  Relevant historical information: Diabetes.  Spondylolisthesis.   Exam:  BP 110/70 (BP Location: Right Arm, Patient Position: Sitting, Cuff Size: Normal)    Pulse 79    Ht 5\' 10"  (1.778 m)    Wt 220 lb 9.6 oz (100.1 kg)    SpO2 97%    BMI 31.65 kg/m  General: Well Developed, well nourished, and in no acute distress.   MSK: Left knee: Normal-appearing Tender palpation at VMO. Normal knee motion with crepitation. Stable ligamentous exam. Intact strength some pain with resisted knee extension. Positive medial McMurray's test. Gait is toed out.    Lab and Radiology Results  Procedure: Real-time Ultrasound Guided Injection of left knee superior lateral patellar space Device: Philips Affiniti 50G Images permanently stored and available for review in PACS Ultrasound evaluation prior to injection reveals intact quad tendon without significant joint effusion. Area of maximal pain near VMO reveals relatively normal-appearing soft tissue and gliding over a bony prominence at the distal  medial femur. Patellar tendon normal-appearing Medial joint line narrowed degenerative appearing or absent at places Medial meniscus Lateral joint line degenerative appearing Verbal informed consent obtained.  Discussed risks and benefits of procedure. Warned about infection bleeding damage to structures skin hypopigmentation and fat atrophy among others. Patient expresses understanding and agreement Time-out conducted.   Noted no overlying erythema, induration, or other signs of local infection.   Skin prepped in a sterile fashion.   Local anesthesia: Topical Ethyl chloride.   With sterile technique and under real time ultrasound guidance: 40 mg of Kenalog and 2 mL of Marcaine injected into knee joint. Fluid seen entering the joint capsule.   Completed without difficulty   Pain immediately resolved suggesting accurate placement of the medication.   Advised to call if fevers/chills, erythema, induration, drainage, or persistent bleeding.   Images permanently stored and available for review in the ultrasound unit.  Impression: Technically successful ultrasound guided injection.   X-ray images left knee obtained today personally and independently interpreted Moderate medial compartment DJD.  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 male with right knee pain.  Pain is located predominantly at the anterior medial knee occurring after doing some leg presses.  Pain thought to predominately due to patellofemoral chondromalacia or patellofemoral pain.  Plan to treat with steroid injection and Voltaren gel and modification of his quad based activities.  Recheck in about 6 weeks.  Continue exercise under the direction of the  personal trainer. Recommend avoiding more than 70 degrees of knee flexion with squats lunges or leg presses for now.   PDMP not reviewed this encounter. Orders Placed This Encounter  Procedures   [place] LIMITED JOINT SPACE  STRUCTURES LOW LEFT(NO LINKED CHARGES)    Order Specific Question:   Reason for Exam (SYMPTOM  OR DIAGNOSIS REQUIRED)    Answer:   L knee pain    Order Specific Question:   Preferred imaging location?    Answer:   [place]   DG Knee AP/LAT W/[place] Left    Standing Status:   Future    Number of Occurrences:   1    Standing Expiration Date:   07/09/2021    Order Specific Question:   Reason for Exam (SYMPTOM  OR DIAGNOSIS REQUIRED)    Answer:   L knee pain    Order Specific Question:   Preferred imaging location?    Answer:   [facility]   No orders of the defined types were placed in this encounter.    Discussed warning signs or symptoms. Please see discharge instructions. Patient expresses understanding.   The above documentation has been reviewed and is accurate and complete Lynne Leader, M.D.

## 2021-06-11 ENCOUNTER — Other Ambulatory Visit: Payer: Self-pay

## 2021-06-11 ENCOUNTER — Encounter: Payer: Self-pay | Admitting: Family Medicine

## 2021-06-11 ENCOUNTER — Ambulatory Visit: Payer: Self-pay

## 2021-06-11 ENCOUNTER — Ambulatory Visit (INDEPENDENT_AMBULATORY_CARE_PROVIDER_SITE_OTHER): Payer: 59 | Admitting: Family Medicine

## 2021-06-11 ENCOUNTER — Ambulatory Visit (INDEPENDENT_AMBULATORY_CARE_PROVIDER_SITE_OTHER): Payer: 59

## 2021-06-11 VITALS — BP 110/70 | HR 79 | Ht 70.0 in | Wt 220.6 lb

## 2021-06-11 DIAGNOSIS — M25562 Pain in left knee: Secondary | ICD-10-CM

## 2021-06-11 NOTE — Patient Instructions (Addendum)
Good to see you today.  You had a L knee injection.  Call or go to the ER if you develop a large red swollen joint with extreme pain or oozing puss.   Please use Voltaren gel (Generic Diclofenac Gel) up to 4x daily for pain as needed.  This is available over-the-counter as both the name brand Voltaren gel and the generic diclofenac gel.   Ok to resume exercise.  Avoid L knee flexion (bending) over 70 deg.  Please get an Xray today before you leave.  Follow-up: one month

## 2021-06-13 NOTE — Progress Notes (Signed)
Left knee x-ray shows medium arthritis.

## 2021-06-19 ENCOUNTER — Encounter: Payer: Self-pay | Admitting: Internal Medicine

## 2021-06-19 ENCOUNTER — Other Ambulatory Visit: Payer: Self-pay

## 2021-06-19 ENCOUNTER — Ambulatory Visit (INDEPENDENT_AMBULATORY_CARE_PROVIDER_SITE_OTHER): Payer: 59 | Admitting: Internal Medicine

## 2021-06-19 VITALS — BP 128/78 | HR 76 | Temp 98.2°F | Resp 16 | Ht 70.0 in | Wt 216.6 lb

## 2021-06-19 DIAGNOSIS — J3089 Other allergic rhinitis: Secondary | ICD-10-CM | POA: Diagnosis not present

## 2021-06-19 DIAGNOSIS — J45990 Exercise induced bronchospasm: Secondary | ICD-10-CM | POA: Diagnosis not present

## 2021-06-19 DIAGNOSIS — R053 Chronic cough: Secondary | ICD-10-CM

## 2021-06-19 DIAGNOSIS — K219 Gastro-esophageal reflux disease without esophagitis: Secondary | ICD-10-CM

## 2021-06-19 NOTE — Progress Notes (Signed)
/  FOLLOW UP Date of Service/Encounter:  06/19/21   Subjective:  Reginald Rios (DOB: 1955/12/09) is a 66 y.o. male who returns to the [facility] on 06/19/2021 in re-evaluation of the following: GERD, chronic cough, mixed rhinitis. History obtained from: chart review and patient.  For Review, LV was on 01/20/21  with Dr.Chrisann Melaragno.  We started protonix 40 mg nightly.  He was coninued on astelin, instructed not to use Afrin and asked to start an INCS.  He was continued on albuterol for cough, but no response to bronchodilator seen on his spirometry.     Pertinent history/diagnostics:  04/13/19: spirometry FEV1/FVC 120% pre and post, and FEV1 91% pre and 102% post with 12% reversibility following bronchodilator.   01/20/21: FEV1: 2.98 L, 88% predicted (pre), 2.98 L, 88% predicted (post) FEV1/FVC ratio: 118% (pre), 116% (post) SPT 04/13/19 borderline ID to grass pollens, major mold mix #4 and cockroach.  Today he presents for follow-up. Has a cough that has been ongoing for a few months (3 months).   Cough initially productive but over the past few weeks is turned dry. He has increased his exercise a lot more.  He does have stenosis in his back and arthritis.  He has had a lot of trouble with pain. He is doing water exercises.  Doing PT. this is helped with the back issues, but he is struggling with chest pain.  States that his chest feels rale. When he was biking a few weeks ago, his breathing got really heavy.  He felt fatigued for the next day.  He does have a heart stent, and has an appointment with cardiology in the next month or so. When he uses the albuterol, it does seem to help some with the cough if he uses 4 puffs at a time.   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  He has not used the nasal saline spray  Allergies as of 06/19/2021        Reactions   Invokana [canagliflozin] Rash   Grass Pollen(k-o-[id number]-t-swt Vern)    Lisinopril Cough        Medication List        Accurate as of June 19, 2021  5:37 PM. If you have any questions, ask your nurse or doctor.          albuterol 108 (90 Base) MCG/ACT inhaler Commonly known as: VENTOLIN HFA Inhale 2 puffs by mouth every 4-6 hours as needed for coughing or wheezing spells   aspirin EC 81 MG tablet Take 81 mg by mouth daily.   Azelastine HCl 137 MCG/SPRAY Soln Place 2 sprays into both nostrils 2 (two) times daily as needed for rhinitis (nasal congestion). Use in each nostril as directed   baclofen 10 MG tablet Commonly known as: LIORESAL Take 1 tablet (10 mg total) by mouth 3 (three) times daily as needed for muscle spasms.   clopidogrel 75 MG tablet Commonly known as: PLAVIX TAKE 1 TABLET BY MOUTH DAILY WITH BREAKFAST   Coenzyme Q10 200 MG capsule Take 200 mg by mouth daily.   Diclofenac Sodium 1.5 % Soln APPLY 2 GRAMS ON TO THE AFFECTED AREA 2 TIMES DAILY   diclofenac Sodium 1 % Gel Commonly known as: VOLTAREN Apply 2 g topically 4 (four) times daily.   ezetimibe 10 MG tablet Commonly known as:  ZETIA TAKE 1 TABLET BY MOUTH ONCE DAILY   Fluad Quadrivalent 0.5 ML injection Generic drug: influenza vaccine adjuvanted Inject into the muscle.   fluticasone 110 MCG/ACT inhaler Commonly known as: FLOVENT HFA Inhale 2 puffs into the lungs 2 (two) times daily. Use with spacer. Rinse, gargle, and spit after use.   FreeStyle Freedom Lite w/Device Kit Use to check blood sugar daily.   freestyle lancets Use daily to check blood sugar   FREESTYLE LITE test strip Generic drug: glucose blood Use daily to check blood sugar.   glimepiride 1 MG tablet Commonly known as: AMARYL Take 1 tablet (1 mg total) by mouth daily with breakfast.   Januvia 100 MG tablet Generic drug: sitaGLIPtin Take 1 tablet by mouth daily.   lidocaine 5 % Commonly known  as: Lidoderm Place 1 patch onto the skin daily. Remove & Discard patch within 12 hours or as directed by MD   losartan 25 MG tablet Commonly known as: COZAAR Take 1 tablet (25 mg total) by mouth daily.   metFORMIN 1000 MG tablet Commonly known as: GLUCOPHAGE TAKE 1 TABLET BY MOUTH 2 TIMES DAILY WITH A MEAL   methylphenidate 20 MG tablet Commonly known as: Ritalin Take 1 tablet (20 mg total) by mouth 2 (two) times daily.   montelukast 10 MG tablet Commonly known as: SINGULAIR TAKE 1 TABLET (10 MG TOTAL) BY MOUTH DAILY.   nitroGLYCERIN 0.4 MG SL tablet Commonly known as: Nitrostat Place 1 tablet (0.4 mg total) under the tongue every 5 (five) minutes as needed.   pantoprazole 40 MG tablet Commonly known as: PROTONIX Take 1 tablet (40 mg total) by mouth daily.   Pfizer COVID-19 Vac Bivalent injection Generic drug: COVID-19 mRNA bivalent vaccine Therapist, music) Inject into the muscle.   pioglitazone 45 MG tablet Commonly known as: Actos Take 1 tablet (45 mg total) by mouth daily.   pravastatin 20 MG tablet Commonly known as: PRAVACHOL TAKE 1 TABLET (20 MG TOTAL) BY MOUTH DAILY.   tadalafil 20 MG tablet Commonly known as: CIALIS Take 0.5-1 tablets (10-20 mg total) by mouth every other day as needed for erectile dysfunction.   testosterone 50 MG/5GM (1%) Gel Commonly known as: ANDROGEL APPLY 2 APPLICATIONS ON TO THE SKIN DAILY       Past Medical History:  Diagnosis Date   ADD (attention deficit disorder)    Anemia    Arthritis    CAD (coronary artery disease)    2/19 PCI/DESx1 to Lcx   Diabetes mellitus without complication (HCC)    Exercise-induced asthma    Heart murmur    Hyperlipidemia    family hx of high cholesterol   Hypogonadism in male    Low back pain    Osteoarthritis    Past Surgical History:  Procedure Laterality Date   CORONARY STENT INTERVENTION N/A 06/23/2017   Procedure: CORONARY STENT INTERVENTION;  Surgeon: Leonie Man, MD;  Location: [place] [facility];  Service: Cardiovascular;  Laterality: N/A;   INTRAVASCULAR PRESSURE WIRE/FFR STUDY N/A 06/23/2017   Procedure: INTRAVASCULAR PRESSURE WIRE/FFR STUDY;  Surgeon: Leonie Man, MD;  Location: [place] [facility];  Service: Cardiovascular;  Laterality: N/A;   KNEE ARTHROSCOPY     LEFT HEART CATH AND CORONARY ANGIOGRAPHY N/A 06/23/2017   Procedure: LEFT HEART CATH AND CORONARY ANGIOGRAPHY;  Surgeon: Leonie Man, MD;  Location: [place] [facility];  Service: Cardiovascular;  Laterality: N/A;   SHOULDER ARTHROSCOPY WITH BICEPSTENOTOMY Right 09/06/2019   Procedure: SHOULDER ARTHROSCOPY  WITH BICEPSTENOTOMY;  Surgeon: Leandrew Koyanagi, MD;  Location: [place];  Service: Orthopedics;  Laterality: Right;   SHOULDER ARTHROSCOPY WITH SUBACROMIAL DECOMPRESSION Right 09/06/2019   Procedure: RIGHT SHOULDER ARTHROSCOPY WITH EXTENSIVE DEBRIDEMENT, SUBACROMIAL DECOMPRESSION, BICEPS TENOTOMY;  Surgeon: Leandrew Koyanagi, MD;  Location: [place];  Service: Orthopedics;  Laterality: Right;   TENOTOMY ACHILLES TENDON     Otherwise, there have been no changes to his past medical history, surgical history, family history, or social history.  ROS: All others negative except as noted per HPI.   Objective:  BP 128/78    Pulse 76    Temp 98.2 F (36.8 C)    Resp 16    Ht _0  (1.778 m)    Wt 216 lb 9.6 oz (98.2 kg)    SpO2 99%    BMI 31.08 kg/m  Body mass index is 31.08 kg/m. Physical Exam: General Appearance:  Alert, cooperative, no distress, appears stated age  Head:  Normocephalic, without obvious abnormality, atraumatic  Eyes:  Conjunctiva clear, EOM's intact  Nose: Nares normal, normal mucosa, no visible anterior polyps, and septum midline  Throat: Lips, tongue normal; teeth and gums normal, normal posterior oropharynx  Neck: Supple, symmetrical  Lungs:   clear to auscultation bilaterally, Respirations unlabored, no coughing  Heart:  regular rate and rhythm and no  murmur, Appears well perfused  Extremities: No edema  Skin: Skin color, texture, turgor normal, no rashes or lesions on visualized portions of skin  Neurologic: No gross deficits  Spirometry:  Tracings reviewed. His effort: Good reproducible efforts. FVC: 2.89L FEV1: 2.45L, 72% predicted FEV1/FVC ratio: 112% Interpretation:  mild restriction present, coughing during exam .  Please see scanned spirometry results for details.   Assessment/Plan   Patient Instructions  Chronic Cough:  - Etiology of chronic cough is broad. Common considerations include asthma, allergic rhinitis, nonallergic rhinitis, acid reflux, neurogenic and/or habitual cough among others.  The history and physical examination suggest this cough is multifactorial and potentially attributed to GERD, and perfumes/irritants with possible uncontrolled asthma.  We will address perfumes/irritants and reflux at this time. We will continue with current asthma medications.  Todays spirometry results, assessed while asymptomatic, look good Continue albuterol HFA, 2 to 4 inhalations every 4-6 hours if needed. Prednisone burst CXR today-[facility] Also advised follow-up with cardiology given history and dyspnea with exertion   Allergic rhinitis with a nonallergic (irritant) component At your previous visit, Perennial and seasonal epicutaneous tests were negative despite a positive histamine control.  Intradermal tests were mildly reactive to grass pollen, major mold mix #4, and cockroach antigen. Continue allergen avoidance to the above as well as to irritants (perfumes, strong smells, chemical cleaners, etc) Continue azelastine nasal spray, 1 spray per nostril twice daily as needed for congestion.  Stay away from [place] as it can cause rebound nasal congestion (if must use, do not use more than 3 days in a row) Nasal saline spray (i.e., Simply Saline, Ocean Mist) or nasal saline lavage (i.e., NeilMed-with distilled water  only) is recommended as needed and prior to medicated nasal sprays.   Acid reflux Appropriate reflux lifestyle modifications have been provided. For now, start Pantoprazole (protonix) 40 mg nightly.  May take Tums as needed.  He will follow-up with [place] in the next few weeks if no improvement.  Sigurd Sos, MD  [facility]

## 2021-06-19 NOTE — Patient Instructions (Addendum)
Chronic Cough:  - Etiology of chronic cough is broad. Common considerations include asthma, allergic rhinitis, nonallergic rhinitis, acid reflux, neurogenic and/or habitual cough among others.  The history and physical examination suggest this cough is multifactorial and potentially attributed to GERD, and perfumes/irritants with possible uncontrolled asthma.  We will address perfumes/irritants and reflux at this time. We will continue with current asthma medications.  Todays spirometry results, assessed while asymptomatic, look good Continue albuterol HFA, 2 to 4 inhalations every 4-6 hours if needed. Prednisone burst CXR today-[facility]   Allergic rhinitis with a nonallergic (irritant) component At your previous visit, Perennial and seasonal epicutaneous tests were negative despite a positive histamine control.  Intradermal tests were mildly reactive to grass pollen, major mold mix #4, and cockroach antigen. Continue allergen avoidance to the above as well as to irritants (perfumes, strong smells, chemical cleaners, etc) Continue azelastine nasal spray, 1 spray per nostril twice daily as needed for congestion.  Stay away from [place] as it can cause rebound nasal congestion (if must use, do not use more than 3 days in a row) Nasal saline spray (i.e., Simply Saline, Ocean Mist) or nasal saline lavage (i.e., NeilMed-with distilled water only) is recommended as needed and prior to medicated nasal sprays.   Acid reflux Appropriate reflux lifestyle modifications have been provided. For now, start Pantoprazole (protonix) 40 mg nightly.  May take Tums as needed.  ---------------------------------------------------------------------------  Lifestyle Changes for Controlling GERD  When you have GERD, stomach acid feels as if its backing up toward your mouth. Whether or not you take medication to control your GERD, your symptoms can often be improved with lifestyle changes.   Raise  Your Head Reflux is more likely to strike when youre lying down flat, because stomach fluid can flow backward more easily. Raising the head of your bed 4-6 inches can help. To do this: Slide blocks or books under the legs at the head of your bed. Or, place a wedge under the mattress. Many foam stores can make a suitable wedge for you. The wedge should run from your waist to the top of your head. Dont just prop your head on several pillows. This increases pressure on your stomach. It can make GERD worse.  Watch Your Eating Habits Certain foods may increase the acid in your stomach or relax the lower esophageal sphincter, making GERD more likely. Its best to avoid the following: Coffee, tea, and carbonated drinks (with and without caffeine) Fatty, fried, or spicy food Mint, chocolate, onions, and tomatoes Any other foods that seem to irritate your stomach or cause you pain  Relieve the Pressure Eat smaller meals, even if you have to eat more often. Dont lie down right after you eat. Wait a few hours for your stomach to empty. Avoid tight belts and tight-fitting clothes. Lose excess weight.  Tobacco and Alcohol Avoid smoking tobacco and drinking alcohol. They can make GERD symptoms worse.  Reducing Pollen Exposure  The American Academy of Allergy, Asthma and Immunology suggests the following steps to reduce your exposure to pollen during allergy seasons.    Do not hang sheets or clothing out to dry; pollen may collect on these items. Do not mow lawns or spend time around freshly cut grass; mowing stirs up pollen. Keep windows closed at night.  Keep car windows closed while driving. Minimize morning activities outdoors, a time when pollen counts are usually at their highest. Stay indoors as much as possible when pollen counts or humidity is high  and on windy days when pollen tends to remain in the air longer. Use air conditioning when possible.  Many air conditioners have filters  that trap the pollen spores. Use a HEPA room air filter to remove pollen form the indoor air you breathe.   Control of Mold Allergen  Mold and fungi can grow on a variety of surfaces provided certain temperature and moisture conditions exist.  Outdoor molds grow on plants, decaying vegetation and soil.  The major outdoor mold, Alternaria and Cladosporium, are found in very high numbers during hot and dry conditions.  Generally, a late Summer - Fall peak is seen for common outdoor fungal spores.  Rain will temporarily lower outdoor mold spore count, but counts rise rapidly when the rainy period ends.  The most important indoor molds are Aspergillus and Penicillium.  Dark, humid and poorly ventilated basements are ideal sites for mold growth.  The next most common sites of mold growth are the bathroom and the kitchen.  Outdoor [employer] Use air conditioning and keep windows closed Avoid exposure to decaying vegetation. Avoid leaf raking. Avoid grain handling. Consider wearing a face mask if working in moldy areas.  Indoor Mold Control Maintain humidity below 50%. Clean washable surfaces with 5% bleach solution. Remove sources e.g. Contaminated carpets.  Control of Cockroach Allergen  Cockroach allergen has been identified as an important cause of acute attacks of asthma, especially in urban settings.  There are fifty-five species of cockroach that exist in the [place], however only three, the [place], [place] species produce allergen that can affect patients with Asthma.  Allergens can be obtained from fecal particles, egg casings and secretions from cockroaches.    Remove food sources. Reduce access to water. Seal access and entry points. Spray runways with 0.5-1% Diazinon or Chlorpyrifos Blow boric acid power under stoves and refrigerator. Place bait stations (hydramethylnon) at feeding sites.

## 2021-06-20 ENCOUNTER — Other Ambulatory Visit: Payer: Self-pay | Admitting: Internal Medicine

## 2021-06-20 ENCOUNTER — Ambulatory Visit: Payer: 59 | Admitting: Physical Therapy

## 2021-06-20 ENCOUNTER — Ambulatory Visit (HOSPITAL_BASED_OUTPATIENT_CLINIC_OR_DEPARTMENT_OTHER)
Admission: RE | Admit: 2021-06-20 | Discharge: 2021-06-20 | Disposition: A | Discharge status: Home / Self Care | Payer: 59 | Source: Ambulatory Visit | Attending: Internal Medicine | Admitting: Internal Medicine

## 2021-06-20 DIAGNOSIS — M25651 Stiffness of right hip, not elsewhere classified: Secondary | ICD-10-CM | POA: Diagnosis not present

## 2021-06-20 DIAGNOSIS — R531 Weakness: Secondary | ICD-10-CM

## 2021-06-20 DIAGNOSIS — M5441 Lumbago with sciatica, right side: Secondary | ICD-10-CM | POA: Diagnosis not present

## 2021-06-20 DIAGNOSIS — M5442 Lumbago with sciatica, left side: Secondary | ICD-10-CM

## 2021-06-20 DIAGNOSIS — R29898 Other symptoms and signs involving the musculoskeletal system: Secondary | ICD-10-CM

## 2021-06-20 DIAGNOSIS — R053 Chronic cough: Secondary | ICD-10-CM | POA: Diagnosis not present

## 2021-06-20 DIAGNOSIS — M25652 Stiffness of left hip, not elsewhere classified: Secondary | ICD-10-CM | POA: Diagnosis not present

## 2021-06-20 LAB — HEPATIC FUNCTION PANEL
ALT: 19 (ref 10–40)
AST: 20 (ref 14–40)
Alkaline Phosphatase: 81 (ref 25–125)
Bilirubin, Total: 0.5

## 2021-06-20 LAB — COMPREHENSIVE METABOLIC PANEL
Albumin: 4.6 (ref 3.5–5.0)
Calcium: 9.4 (ref 8.7–10.7)
Globulin: 2.6
eGFR: 91

## 2021-06-20 LAB — CBC AND DIFFERENTIAL
HCT: 38 — AB (ref 41–53)
Hemoglobin: 12.8 — AB (ref 13.5–17.5)
Platelets: 229 (ref 150–399)
WBC: 6.4

## 2021-06-20 LAB — POCT ERYTHROCYTE SEDIMENTATION RATE, NON-AUTOMATED: Sed Rate: 27

## 2021-06-20 LAB — BASIC METABOLIC PANEL
BUN: 15 (ref 4–21)
CO2: 24 — AB (ref 13–22)
Chloride: 103 (ref 99–108)
Creatinine: 0.9 (ref <=1.3)
Glucose: 127
Potassium: 4.5 (ref 3.4–5.3)
Sodium: 141 (ref 137–147)

## 2021-06-20 LAB — TSH: TSH: 3.75 (ref <=5.90)

## 2021-06-20 LAB — HEMOGLOBIN A1C: Hemoglobin A1C: 7.2

## 2021-06-20 LAB — CBC: RBC: 3.86 — AB (ref 3.87–5.11)

## 2021-06-20 NOTE — Therapy (Signed)
[facility] [address] [place], [place], [address] Phone: [phone]   Fax:  [phone]  Physical Therapy Treatment and Discharge  Patient Details  Name: Reginald Rios MRN: [id number] Date of Birth: 12-28-1955 Referring Provider (PT): Dr Lynne Leader   Encounter Date: 06/20/2021   PT End of Session - 06/20/21 1441     Visit Number 17    Number of Visits 21    Date for PT Re-Evaluation 07/04/21    Authorization - Visit Number 17    Progress Note Due on Visit 41    PT Start Time 1400    PT Stop Time 6606    PT Time Calculation (min) 42 min    Activity Tolerance Patient tolerated treatment well    Behavior During Therapy [facility] for tasks assessed/performed             Past Medical History:  Diagnosis Date   ADD (attention deficit disorder)    Anemia    Arthritis    CAD (coronary artery disease)    2/19 PCI/DESx1 to Lcx   Diabetes mellitus without complication ([place])    Exercise-induced asthma    Heart murmur    Hyperlipidemia    family hx of high cholesterol   Hypogonadism in male    Low back pain    Osteoarthritis     Past Surgical History:  Procedure Laterality Date   CORONARY STENT INTERVENTION N/A 06/23/2017   Procedure: CORONARY STENT INTERVENTION;  Surgeon: Leonie Man, MD;  Location: [place] [facility];  Service: Cardiovascular;  Laterality: N/A;   INTRAVASCULAR PRESSURE WIRE/FFR STUDY N/A 06/23/2017   Procedure: INTRAVASCULAR PRESSURE WIRE/FFR STUDY;  Surgeon: Leonie Man, MD;  Location: [place] [facility];  Service: Cardiovascular;  Laterality: N/A;   KNEE ARTHROSCOPY     LEFT HEART CATH AND CORONARY ANGIOGRAPHY N/A 06/23/2017   Procedure: LEFT HEART CATH AND CORONARY ANGIOGRAPHY;  Surgeon: Leonie Man, MD;  Location: [place] [facility];  Service: Cardiovascular;  Laterality: N/A;   SHOULDER ARTHROSCOPY WITH BICEPSTENOTOMY Right 09/06/2019   Procedure: SHOULDER ARTHROSCOPY WITH  BICEPSTENOTOMY;  Surgeon: Leandrew Koyanagi, MD;  Location: [place];  Service: Orthopedics;  Laterality: Right;   SHOULDER ARTHROSCOPY WITH SUBACROMIAL DECOMPRESSION Right 09/06/2019   Procedure: RIGHT SHOULDER ARTHROSCOPY WITH EXTENSIVE DEBRIDEMENT, SUBACROMIAL DECOMPRESSION, BICEPS TENOTOMY;  Surgeon: Leandrew Koyanagi, MD;  Location: [place];  Service: Orthopedics;  Laterality: Right;   TENOTOMY ACHILLES TENDON      There were no vitals filed for this visit.   Subjective Assessment - 06/20/21 1404     Subjective Pt states he has been performing updated HEP and says "it is going great!". He has continued with pool exercises and pickleball    Patient Stated Goals get active again to prevent weight gain; play pickle ball    Currently in Pain? No/denies                [facility] PT Assessment - 06/20/21 0001       Assessment   Medical Diagnosis LBP with Lt > Rt LE radicular pain    Referring Provider (PT) Dr Lynne Leader    Onset Date/Surgical Date 02/05/21    Hand Dominance Right    Prior Therapy here for back; neck; LE's      AROM   Lumbar Flexion 75%    Lumbar Extension 10%    Lumbar - Right Side Bend 60%  Lumbar - Left Side Bend 75%      Flexibility   Hamstrings Rt: 82, Lt 89                           [facility] Adult PT Treatment/Exercise - 06/20/21 0001       Lumbar Exercises: Stretches   Passive Hamstring Stretch Right;Left;2 reps;20 seconds    Passive Hamstring Stretch Limitations with strap    Hip Flexor Stretch Right;Left;2 reps;20 seconds   seated, leg back, arm overhead.   Gastroc Stretch Right;Left;2 reps;20 seconds      Lumbar Exercises: Aerobic   Recumbent Bike L3 x 5 min for warm up      Lumbar Exercises: Standing   Wall Slides 10 reps;5 seconds   quarter squat   Row Strengthening;20 reps    Theraband Level (Row) Level 3 (Green)    Other Standing Lumbar Exercises pallof press green x 10 bilat                      PT Education - 06/20/21 1441     Education Details updated HEP, plan for d/c    Person(s) Educated Patient    Methods Explanation;Demonstration;Handout    Comprehension Returned demonstration;Verbalized understanding                 PT Long Term Goals - 06/20/21 1408       PT LONG TERM GOAL #1   Title Patient to be independent with advanced HEP.    Status Achieved      PT LONG TERM GOAL #2   Title Improve trunk and LE mobility/ROM/tissue extensibility demonstrated by increased lumbar extension to 15-20% with no pain    Baseline 5 degrees past neutral    Status Not Met      PT LONG TERM GOAL #3   Title Patient reports return to normal functional and recreational activities with minimal to no increase in pain > 1-2/10 pain    Status Achieved      PT LONG TERM GOAL #4   Title Patient reports return to pickleball at level prior to injury    Status Achieved      PT LONG TERM GOAL #5   Title Improve functional limitation score to 49    Baseline 50 on 06/06/21    Status Achieved                   Plan - 06/20/21 1441     Clinical Impression Statement Session focused on updating and reviewing HEP. 3 programs: aquatics, floor and standing. Pt with good understanding of all PT exercises and ready for d/c to HEP    PT Next Visit Plan d/c    PT [place] and Agree with Plan of Care Patient             Patient will benefit from skilled therapeutic intervention in order to improve the following deficits and impairments:     Visit Diagnosis: Acute bilateral low back pain with bilateral sciatica  Other symptoms and signs involving the musculoskeletal system  Weakness generalized     Problem List Patient Active Problem List   Diagnosis Date Noted   Spondylolisthesis at L5-S1 level 05/09/2021   Osteoarthritis    Low back pain    Heart murmur    Exercise-induced asthma    Diabetes mellitus without  complication (HCC)    Arthritis  Anemia    Myofascial pain syndrome, cervical 07/28/2019   Lateral epicondylitis of right elbow 07/28/2019   Nontraumatic incomplete tear of right rotator cuff 07/25/2019   Tendinopathy of right biceps tendon 07/25/2019   Erectile dysfunction 07/11/2019   Mild persistent asthma/cough variant asthma 04/13/2019   Allergic rhinitis with a possible nonallergic component 04/13/2019   Acid reflux 04/13/2019   Chronic cough 04/13/2019   Hoarseness 04/13/2019   Cervical radicular pain 01/10/2019   Ulnar neuropathy at elbow of right upper extremity 01/03/2019   Acute bilateral low back pain without sciatica 01/03/2019   Onychomycosis 05/25/2018   Palpitations 07/06/2017   CAD (coronary artery disease) 07/05/2017   Family history of early CAD 07/05/2017   Abnormal findings on diagnostic imaging of cardiovascular system 06/23/2017   Low vitamin D level 04/22/2017   Pain in right ankle and joints of right foot 10/05/2016   Hemarthrosis of right elbow 02/03/2016   Small thenar eminence 02/03/2016   Cervical disc disorder with radiculopathy of cervical region 10/07/2015   Incomplete rotator cuff tear 09/10/2015   Nonischemic cardiomyopathy ([place]) 05/15/2015   Fatigue 05/15/2015   Dyspnea on exertion 05/15/2015   Decreased cardiac ejection fraction 04/02/2015   Strain of latissimus dorsi muscle 01/21/2015   Shoulder bursitis 10/12/2014   Spinal stenosis, lumbar region, without neurogenic claudication 10/12/2014   BPPV (benign paroxysmal positional vertigo) 08/14/2014   Hyperlipidemia 01/19/2014   Anemia, unspecified 01/19/2014   ADD (attention deficit disorder) 01/19/2014   Type 2 diabetes mellitus with hyperglycemia, without long-term current use of insulin ([place]) 12/22/2013   Hypogonadism in male 12/22/2013   Arthritis of hand, left 12/22/2013   PHYSICAL THERAPY DISCHARGE SUMMARY  Visits from Start of Care: 17  Current functional level related to goals  / functional outcomes: Improved mobility, strength and ROM   Remaining deficits: See above   Education / Equipment: HEP   Patient agrees to discharge. Patient goals were met. Patient is being discharged due to being pleased with the current functional level.  Liann Spaeth, PT 06/20/2021, 2:47 PM  [facility] [address] [place], [place], [address] Phone: [phone]   Fax:  [phone]  Name: Reginald Rios MRN: [id number] Date of Birth: 1955-05-27

## 2021-06-20 NOTE — Patient Instructions (Signed)
Access Code: [email or web address] URL: [email or web address] Date: 06/20/2021 Prepared by: Isabelle Course  Exercises Standing [place] on Wall with Reaches - 1 x daily - 3 x weekly - 1 sets - 5-10 reps Standing Row with Anchored Resistance - 1 x daily - 3 x weekly - 2 sets - 10 reps Wall Quarter Squat - 1 x daily - 3 x weekly - 1 sets - 10 reps - 5 seconds hold Anti-Rotation Lateral Stepping with Press - 2 x daily - 3 x weekly - 1-2 sets - 10 reps - 2-3 sec hold Gastroc Stretch on Wall - 2 x daily - 7 x weekly - 1 sets - 3 reps - 30 sec hold Doorway Pec Stretch at 120 Degrees Abduction - 1 x daily - 7 x weekly - 1 sets - 2 reps - 20 seconds hold Seated Hip Flexor Stretch - 2 x daily - 7 x weekly - 1 sets - 3 reps - 30 sec hold Prone Quadriceps Stretch with Strap - 1 x daily - 3 x weekly - 1 sets - 2 reps - 20 hold Hooklying Hamstring Stretch with Strap - 1 x daily - 3 x weekly - 3 sets - 10 reps Hip Adductors and Hamstring Stretch with Strap - 1 x daily - 3 x weekly - 1 sets - 3 reps - 15-30 hold Supine Piriformis Stretch with Leg Straight - 1 x daily - 3 x weekly - 1 sets - 2-3 reps - 15-30 hold Supine Hip Internal and External Rotation - 1 x daily - 3 x weekly - 1 sets - 2-3 reps - 5-10 seconds hold Seated Hamstring Stretch - 1 x daily - 7 x weekly - 1 sets - 2-3 reps - 20-30 seconds hold Side Stepping - 1 x daily - 7 x weekly - 3 sets - 10 reps Backward Walking - 1 x daily - 7 x weekly - 3 sets - 10 reps Forward Walking - 1 x daily - 7 x weekly - 3 sets - 10 reps Forward March - 1 x daily - 7 x weekly - 3 sets - 10 reps

## 2021-06-21 LAB — GAMMA GT: GGT: 12

## 2021-06-21 LAB — LIPID PANEL
Cholesterol: 124 (ref 0–200)
HDL: 51 (ref 35–70)
LDL Cholesterol: 61
Triglycerides: 50 (ref 40–160)

## 2021-06-21 LAB — BUN/CREATININE RATIO: BUN/Creatinine Ratio: 16

## 2021-06-21 LAB — PROTEIN, TOTAL: Protein: 7.2

## 2021-06-21 LAB — APOLIPOPROTEIN B: Apolipoprotein B: 58

## 2021-06-21 LAB — URIC ACID: Uric Acid: 5

## 2021-06-21 LAB — C REACTIVE PROTEIN, FLUID: CRP: 0.94

## 2021-06-21 LAB — LIPASE: Lipase: 25

## 2021-06-21 LAB — MAGNESIUM: Magnesium: 2

## 2021-06-21 LAB — CHG LIPOPROTEIN DIRECT MEASUREMENT VLDL CHOLESTEROL: VLDL Cholesterol Cal: 12

## 2021-06-23 NOTE — Progress Notes (Signed)
Please let [name] know that his chest xray was normal of his lungs.  It does show degenerative bone changes in his spine which is already aware of.

## 2021-06-23 NOTE — Progress Notes (Signed)
He should start the Flovent 110, 2 puffs twice a day.  This will help with airway inflammation without giving him the systemic exposure from oral steroids.

## 2021-06-24 ENCOUNTER — Other Ambulatory Visit (HOSPITAL_BASED_OUTPATIENT_CLINIC_OR_DEPARTMENT_OTHER): Payer: Self-pay

## 2021-07-01 ENCOUNTER — Other Ambulatory Visit: Payer: Self-pay | Admitting: Cardiology

## 2021-07-02 ENCOUNTER — Encounter: Payer: Self-pay | Admitting: Family Medicine

## 2021-07-02 ENCOUNTER — Other Ambulatory Visit (HOSPITAL_BASED_OUTPATIENT_CLINIC_OR_DEPARTMENT_OTHER): Payer: Self-pay

## 2021-07-02 MED ORDER — EZETIMIBE 10 MG PO TABS
ORAL_TABLET | Freq: Every day | ORAL | 1 refills | Status: DC
  Filled 2021-07-02: qty 90, 90d supply, fill #0
  Filled 2021-10-08: qty 90, 90d supply, fill #1

## 2021-07-02 MED ORDER — CLOPIDOGREL BISULFATE 75 MG PO TABS
ORAL_TABLET | Freq: Every day | ORAL | 1 refills | Status: DC
  Filled 2021-07-02: qty 90, 90d supply, fill #0
  Filled 2021-10-08: qty 90, 90d supply, fill #1

## 2021-07-02 NOTE — Progress Notes (Signed)
Results of TWIN study received and abstracted.  ?

## 2021-07-08 ENCOUNTER — Other Ambulatory Visit (HOSPITAL_BASED_OUTPATIENT_CLINIC_OR_DEPARTMENT_OTHER): Payer: Self-pay

## 2021-07-08 NOTE — Progress Notes (Signed)
? ?I, [name], LAT, ATC, am serving as scribe for Dr. Lynne Leader. ? ?ARVIS ZWAHLEN is a 66 y.o. male who presents to [place] at [facility] today for f/u of L medial knee pain and swelling and for f/u of chronic L-sided LBP.  He was last seen by Dr. Georgina Snell on 07/01/21 w/ new-onset L medial knee pain and swelling x 2 weeks and had a L knee steroid injection.  His L-sided LBP was improving and he was advised to con't PT of which he has now completed 17 visits and been d/c.  He was advised to use Voltaren gel for his knee and modify his lower body strengthening exercises.  Today, pt reports 5-6 days ago he was bending forward working on something and his L-sided low back pain returned, close to midline. Pt got a deep tissue massage and pain mostly resolved. Pt is afraid to do any activities now because he doesn't want to cause the issue to return. ? ?Pt reports L knee is still painful.  ? ?Diagnostic testing: L knee XR- 06/11/21; L-spine MRI- 05/03/21; L-spine XR- 02/19/21 ? ?Pertinent review of systems: Fevers or chills. ?Benuel mentions that he is having some episodes of lightheaded and dizziness associated with measured blood sugars into the low 60s with exercise.  This started with his current dose of Amaryl. ? ?Relevant historical information: Diabetes ? ? ?Exam:  ?BP 126/74   Pulse 80   Ht '5\' 10"'$  (1.778 m)   Wt 216 lb 6.4 oz (98.2 kg)   SpO2 99%   BMI 31.05 kg/m?  ?General: Well Developed, well nourished, and in no acute distress.  ? ?MSK: L-spine: Nontender midline.  Tender palpation lumbar paraspinal musculature. ?Decreased lumbar motion. ? ? ?Left knee: Normal motion with crepitation. ?Tender palpation medial joint line. ? ?Right knee: Normal motion with crepitation.  Tender palpation medial joint line. ? ? ? ?Lab and Radiology Results ? ?X-ray images right knee obtained today personally and independently interpreted ?Mild medial compartment and patellofemoral DJD. ?Await formal  radiology review ? ?EXAM: ?LEFT KNEE 3 VIEWS ?  ?COMPARISON:  None. ?  ?FINDINGS: ?There is no acute fracture or dislocation. The bones are osteopenic. ?Moderate arthritic changes with tricompartmental narrowing and ?spurring. No significant joint effusion. The soft tissues are ?unremarkable. ?  ?IMPRESSION: ?1. No acute fracture or dislocation. ?2. Moderate arthritic changes. ?  ?  ?Electronically Signed ?  By: Anner Crete M.D. ?  On: 06/12/2021 12:53 ?  ?I, Lynne Leader, personally (independently) visualized and performed the interpretation of the images attached in this note. ? ? ?Assessment and Plan: ?66 y.o. male with bilateral knee pain left worse than right thought to be due to DJD.  Patient had trial of steroid injection for his left knee about a month ago that helped a little but not sufficiently.  We will work on authorization now for hyaluronic acid injection series and proceed with that likely bilaterally. ? ?His back pain episode that brought him in today is now improving with a little bit of time and massage therapy.  Recommend continue home exercise program that he was taught previously in physical therapy. ?We could proceed to spine interventional injections if needed such as facet injections or epidural steroid injections in the future.  However his pain is improving now and that likely will not be necessary. ? ?He brought up these episodes of hypoglycemia with exercise.  Seems to be related to his new Amaryl dose.  We  will send a message to his PCP.  He may need a medication change with his diabetes medications. ? ? ?PDMP not reviewed this encounter. ?Orders Placed This Encounter  ?Procedures  ? DG Knee AP/LAT W/[place] Right  ?  Standing Status:   Future  ?  Number of Occurrences:   1  ?  Standing Expiration Date:   08/09/2021  ?  Order Specific Question:   Reason for Exam (SYMPTOM  OR DIAGNOSIS REQUIRED)  ?  Answer:   right knee pain  ?  Order Specific Question:   Preferred imaging location?  ?   Answer:   [facility]  ? ?No orders of the defined types were placed in this encounter. ? ? ? ?Discussed warning signs or symptoms. Please see discharge instructions. Patient expresses understanding. ? ? ?The above documentation has been reviewed and is accurate and complete Lynne Leader, M.D. ? ? ?

## 2021-07-09 ENCOUNTER — Ambulatory Visit: Payer: 59 | Admitting: Family Medicine

## 2021-07-09 ENCOUNTER — Ambulatory Visit (INDEPENDENT_AMBULATORY_CARE_PROVIDER_SITE_OTHER): Payer: 59

## 2021-07-09 ENCOUNTER — Other Ambulatory Visit: Payer: Self-pay

## 2021-07-09 VITALS — BP 126/74 | HR 80 | Ht 70.0 in | Wt 216.4 lb

## 2021-07-09 DIAGNOSIS — M25561 Pain in right knee: Secondary | ICD-10-CM

## 2021-07-09 DIAGNOSIS — G8929 Other chronic pain: Secondary | ICD-10-CM | POA: Diagnosis not present

## 2021-07-09 DIAGNOSIS — M170 Bilateral primary osteoarthritis of knee: Secondary | ICD-10-CM

## 2021-07-09 DIAGNOSIS — M545 Low back pain, unspecified: Secondary | ICD-10-CM

## 2021-07-09 DIAGNOSIS — M25562 Pain in left knee: Secondary | ICD-10-CM

## 2021-07-09 NOTE — Patient Instructions (Addendum)
Thank you for coming in today.  ? ?Please get an Xray today before you leave  ? ?I will send your primary care doctor a message about your low blood sugar episodes ? ?We will work on authorizing you for gel shots. You will hear from [employer] once we get approval. ? ? ?

## 2021-07-10 NOTE — Progress Notes (Signed)
Right knee x-ray shows mild to medium arthritis.

## 2021-07-22 ENCOUNTER — Encounter: Payer: Self-pay | Admitting: Family Medicine

## 2021-07-22 ENCOUNTER — Ambulatory Visit: Payer: 59 | Admitting: Family Medicine

## 2021-07-22 VITALS — BP 130/74 | HR 72 | Temp 97.3°F | Resp 16 | Ht 70.0 in | Wt 208.2 lb

## 2021-07-22 DIAGNOSIS — F988 Other specified behavioral and emotional disorders with onset usually occurring in childhood and adolescence: Secondary | ICD-10-CM | POA: Diagnosis not present

## 2021-07-22 DIAGNOSIS — E1165 Type 2 diabetes mellitus with hyperglycemia: Secondary | ICD-10-CM | POA: Diagnosis not present

## 2021-07-22 DIAGNOSIS — I428 Other cardiomyopathies: Secondary | ICD-10-CM

## 2021-07-22 LAB — MICROALBUMIN / CREATININE URINE RATIO
Creatinine,U: 129.4 mg/dL
Microalb Creat Ratio: 1.2 mg/g (ref 0.0–30.0)
Microalb, Ur: 1.6 mg/dL (ref 0.0–1.9)

## 2021-07-22 NOTE — Progress Notes (Signed)
Subjective:  ? ?Chief Complaint  ?Patient presents with  ? Diabetes  ?  Here for Diabetes check up   ? ? ?Reginald Rios is a 66 y.o. male here for follow-up of diabetes.   ?Isiac's self monitored glucose range is low 100's.  ?Patient denies hypoglycemic reactions. ?Patient does not require insulin.   ?Medications include: Actos 22.5 mg/d, Amaryl 1 mg/d, Metformin 1000 mg bid ?Diet is healthier.  ?Exercise: walking, pickle ball ?No Cp or SOB.  ? ?ADHD ?Taking Ritalin 20 mg bid. Compliant, no AE's. Doing well.  ? ?Past Medical History:  ?Diagnosis Date  ? ADD (attention deficit disorder)   ? Anemia   ? Arthritis   ? CAD (coronary artery disease)   ? 2/19 PCI/DESx1 to Lcx  ? Diabetes mellitus without complication ([place])   ? Exercise-induced asthma   ? Heart murmur   ? Hyperlipidemia   ? family hx of high cholesterol  ? Hypogonadism in male   ? Low back pain   ? Osteoarthritis   ?  ? ?Related testing: ?Retinal exam: Done ?Pneumovax: done ? ?Objective:  ?BP 130/74 (BP Location: Right Arm, Patient Position: Sitting, Cuff Size: Normal)   Pulse 72   Temp (!) 97.3 ?F (36.3 ?C) (Oral)   Resp 16   Ht '5\' 10"'$  (1.778 m)   Wt 208 lb 3.2 oz (94.4 kg)   SpO2 98%   BMI 29.87 kg/m?  ?General:  Well developed, well nourished, in no apparent distress ?Skin:  Warm, no pallor or diaphoresis ?Head:  Normocephalic, atraumatic ?Eyes:  Pupils equal and round, sclera anicteric without injection  ?Lungs:  CTAB, no access msc use ?Cardio:  RRR, no bruits, no LE edema ?Musculoskeletal:  Symmetrical muscle groups noted without atrophy or deformity ?Neuro:  Sensation intact to pinprick on feet ?Psych: Age appropriate judgment and insight ? ?Assessment:  ? ?Type 2 diabetes mellitus with hyperglycemia, without long-term current use of insulin ([place]) - Plan: Microalbumin / creatinine urine ratio ? ?Attention deficit disorder (ADD) without hyperactivity ? ?Nonischemic cardiomyopathy ([place]), Chronic  ? ?Plan:  ? ?Chronic, stable.  Continue  metformin 1000 mg twice daily, Actos 22.5 mg daily, Amaryl 1 mg daily.  Would take away Amaryl next.  Counseled on diet and exercise. ?Chronic, stable.  Continue Ritalin 20 mg twice daily. ?F/u in 6 mo for physical or as needed. ?The patient voiced understanding and agreement to the plan. ? ?Reginald Pal, DO ?07/22/21 ?11:28 AM ? ?

## 2021-07-22 NOTE — Patient Instructions (Signed)
Give [place] 2-3 business days to get the results of your labs back.  ? ?Keep the diet clean and stay active. ? ?Very strong work with your weight loss and improvement in sugars.  ? ?Let us know if you need anything. ?

## 2021-07-27 DIAGNOSIS — M542 Cervicalgia: Secondary | ICD-10-CM | POA: Diagnosis not present

## 2021-07-29 ENCOUNTER — Other Ambulatory Visit (HOSPITAL_BASED_OUTPATIENT_CLINIC_OR_DEPARTMENT_OTHER): Payer: Self-pay

## 2021-07-29 ENCOUNTER — Other Ambulatory Visit: Payer: Self-pay | Admitting: Cardiology

## 2021-07-29 DIAGNOSIS — M9903 Segmental and somatic dysfunction of lumbar region: Secondary | ICD-10-CM | POA: Diagnosis not present

## 2021-07-29 DIAGNOSIS — M542 Cervicalgia: Secondary | ICD-10-CM | POA: Diagnosis not present

## 2021-07-29 DIAGNOSIS — M9901 Segmental and somatic dysfunction of cervical region: Secondary | ICD-10-CM | POA: Diagnosis not present

## 2021-07-29 DIAGNOSIS — M545 Low back pain, unspecified: Secondary | ICD-10-CM | POA: Diagnosis not present

## 2021-07-29 MED ORDER — LOSARTAN POTASSIUM 25 MG PO TABS
25.0000 mg | ORAL_TABLET | Freq: Every day | ORAL | 1 refills | Status: DC
  Filled 2021-07-29: qty 90, 90d supply, fill #0
  Filled 2021-11-05: qty 90, 90d supply, fill #1

## 2021-08-04 ENCOUNTER — Other Ambulatory Visit (HOSPITAL_BASED_OUTPATIENT_CLINIC_OR_DEPARTMENT_OTHER): Payer: Self-pay

## 2021-08-04 ENCOUNTER — Other Ambulatory Visit: Payer: Self-pay | Admitting: Family Medicine

## 2021-08-04 ENCOUNTER — Other Ambulatory Visit: Payer: Self-pay | Admitting: Internal Medicine

## 2021-08-04 DIAGNOSIS — K219 Gastro-esophageal reflux disease without esophagitis: Secondary | ICD-10-CM

## 2021-08-04 MED ORDER — PANTOPRAZOLE SODIUM 40 MG PO TBEC
40.0000 mg | DELAYED_RELEASE_TABLET | Freq: Every day | ORAL | 3 refills | Status: DC
  Filled 2021-08-04: qty 30, 30d supply, fill #0
  Filled 2021-09-04: qty 30, 30d supply, fill #1
  Filled 2021-09-05: qty 30, 30d supply, fill #0
  Filled 2021-10-08: qty 30, 30d supply, fill #1
  Filled 2021-11-05: qty 30, 30d supply, fill #2

## 2021-08-04 MED ORDER — MONTELUKAST SODIUM 10 MG PO TABS
ORAL_TABLET | Freq: Every day | ORAL | 3 refills | Status: DC
  Filled 2021-08-04: qty 90, 90d supply, fill #0
  Filled 2021-11-05: qty 90, 90d supply, fill #1
  Filled 2022-02-09 – 2022-02-11 (×2): qty 90, 90d supply, fill #2
  Filled 2022-05-11 – 2022-05-13 (×2): qty 90, 90d supply, fill #3

## 2021-08-06 ENCOUNTER — Ambulatory Visit: Payer: 59 | Admitting: Family Medicine

## 2021-08-06 ENCOUNTER — Other Ambulatory Visit (HOSPITAL_BASED_OUTPATIENT_CLINIC_OR_DEPARTMENT_OTHER): Payer: Self-pay

## 2021-08-06 VITALS — BP 138/78 | HR 73 | Ht 70.0 in | Wt 205.0 lb

## 2021-08-06 DIAGNOSIS — M79601 Pain in right arm: Secondary | ICD-10-CM | POA: Diagnosis not present

## 2021-08-06 DIAGNOSIS — M791 Myalgia, unspecified site: Secondary | ICD-10-CM | POA: Diagnosis not present

## 2021-08-06 MED ORDER — OXYCODONE-ACETAMINOPHEN 5-325 MG PO TABS
1.0000 | ORAL_TABLET | Freq: Three times a day (TID) | ORAL | 0 refills | Status: DC | PRN
  Filled 2021-08-06: qty 15, 5d supply, fill #0

## 2021-08-06 NOTE — Patient Instructions (Addendum)
Thank you for coming in today.  ? ?Use the splint as needed. OK to take it off ? ?I've sent a prescription for oxycodone to your pharmacy.  ? ?Recheck back in 1 weeks ?

## 2021-08-06 NOTE — Progress Notes (Signed)
? ?  I, Peterson Lombard, LAT, ATC acting as a scribe for Lynne Leader, MD. ? ?Reginald Rios is a 66 y.o. male who presents to [place] at [facility] today for R elbow pain. Pt was previously seen by Dr. Georgina Snell on 07/09/21 bilat knee pain and chronic L-sided LBP. Today, pt reports R elbow pain yesterday. MOI: Pt was spraying the outside of his house w/ a hose attachment for 2 hours and then made supper and then noticed the pain in his R elbow. Pt locates pain to through forearm, posterior aspect of the R elbow, and into R hand. Pt is unable to lift anything w/ his R arm. Pt notes hx of ulnar nerve transposition 25 years ago. ? ?R elbow swelling: yes ?Aggravates: elbow ext, supination/pronation ?Treatments tried: massage gun, lidocaine patch, baclofen, ice ? ?Dx imaging: 06/21/19 R elbow XR ?05/22/19 R elbow XR ? ?Pertinent review of systems: No fevers or chills ? ?Relevant historical information: Diabetes ? ? ?Exam:  ?BP 138/78   Pulse 73   Ht '5\' 10"'$  (1.778 m)   Wt 205 lb (93 kg)   SpO2 96%   BMI 29.41 kg/m?  ?General: Well Developed, well nourished, and in no acute distress.  ? ?MSK: Right arm normal-appearing no visible swelling.  Tender palpation along the dorsal forearm, anterior elbow, posterior elbow. ?Pain with elbow flexion and extension.  Pain with resisted wrist extension.  No pain with wrist flexion. ?Reduced strength with significant pain and guarding. ?Pulses cap refill and sensation are intact distally. ? ? ?Patient was fitted with a custom formed fiberglass long-arm ulnar gutter splint ? ? ?Assessment and Plan: ?66 y.o. male with right arm pain after doing several hours of an usual activity with his right arm. ?I believe his pain is due to overuse and delayed onset muscle soreness.  He is having a lot of pain and guarding right now and I think would benefit from a temporary splint to immobilize his elbow and wrist.  We will use a long-arm splint ulnar gutter type temporarily.   Advised him that he can remove the splint I have and work on arm range of motion.  Recommend that he recheck in 1 week.  Limited refill oxycodone for pain control.  Precautions reviewed. ? ? ?PDMP reviewed during this encounter. ?No orders of the defined types were placed in this encounter. ? ?Meds ordered this encounter  ?Medications  ? oxyCODONE-acetaminophen (PERCOCET/ROXICET) 5-325 MG tablet  ?  Sig: Take 1 tablet by mouth every 8 (eight) hours as needed for severe pain.  ?  Dispense:  15 tablet  ?  Refill:  0  ? ? ? ?Discussed warning signs or symptoms. Please see discharge instructions. Patient expresses understanding. ? ? ?The above documentation has been reviewed and is accurate and complete Lynne Leader, M.D. ? ? ?

## 2021-08-12 ENCOUNTER — Ambulatory Visit (INDEPENDENT_AMBULATORY_CARE_PROVIDER_SITE_OTHER): Payer: 59 | Admitting: Family Medicine

## 2021-08-12 ENCOUNTER — Encounter: Payer: Self-pay | Admitting: Family Medicine

## 2021-08-12 VITALS — BP 120/72 | HR 85 | Ht 70.0 in | Wt 205.4 lb

## 2021-08-12 DIAGNOSIS — M79601 Pain in right arm: Secondary | ICD-10-CM | POA: Diagnosis not present

## 2021-08-12 NOTE — Progress Notes (Signed)
? ?  I, [name], LAT, ATC, am serving as scribe for Dr. Lynne Leader. ? ?Reginald Rios is a 66 y.o. male who presents to [place] at [facility] today for f/u of R elbow pain due to overuse/DOMs.  He was last seen by Dr. Georgina Snell on 08/06/21 and was advised to use a splint to immobilize the area temporarily.   He was also prescribed oxycodone-acetaminophen.  Pt notes hx of ulnar nerve transposition 25 years ago.  Today, pt reports that he feels about 80% improved.  He states that he tested it out a bit yesterday w/ some raking.  He locates his pain to his R elbow and medial forearm.  He describes his pain as an aching pain. ? ?Dx imaging: 06/21/19 R elbow XR ?05/22/19 R elbow XR ?Pertinent review of systems: No fevers or chills ? ?Relevant historical information: Diabetes ? ? ?Exam:  ?BP 120/72 (BP Location: Left Arm, Patient Position: Sitting, Cuff Size: Normal)   Pulse 85   Ht '5\' 10"'$  (1.778 m)   Wt 205 lb 6.4 oz (93.2 kg)   SpO2 96%   BMI 29.47 kg/m?  ?General: Well Developed, well nourished, and in no acute distress.  ? ?MSK: Right elbow normal-appearing ?Forearm is nontender. ?Wrist motion intact some pain with extension. ? ? ? ?Assessment and Plan: ?66 y.o. male with right forearm pain much improved with rest and immobilization over the last week.  He has weaned out of the splint at this point.  Plan for home exercise program.  Reviewed with him today some stretches that he can get started on.  I recommended using a removable brace as needed with activity over the next week.  If not improving on his own with a home exercise program he can let me know and I can refer to hand therapy which would be a good next step. ? ?Total encounter time 20 minutes including face-to-face time with the patient and, reviewing past medical record, and charting on the date of service.   ?Treatment plan and options and home exercise teaching. ? ? ? ?Discussed warning signs or symptoms. Please see discharge  instructions. Patient expresses understanding. ? ? ?The above documentation has been reviewed and is accurate and complete Lynne Leader, M.D. ? ? ?

## 2021-08-12 NOTE — Patient Instructions (Signed)
Good to see you today. ? ?Ok to keep using Voltaren gel. ? ?Use a wrist brace as needed ? ?Follow-up: as needed but let me know if not con't to improve and I can refer you to hand therapy. ?

## 2021-08-13 ENCOUNTER — Other Ambulatory Visit (HOSPITAL_BASED_OUTPATIENT_CLINIC_OR_DEPARTMENT_OTHER): Payer: Self-pay

## 2021-08-13 ENCOUNTER — Other Ambulatory Visit: Payer: Self-pay | Admitting: Family Medicine

## 2021-08-13 MED ORDER — METHYLPHENIDATE HCL 20 MG PO TABS
20.0000 mg | ORAL_TABLET | Freq: Two times a day (BID) | ORAL | 0 refills | Status: DC
  Filled 2021-08-13: qty 60, 30d supply, fill #0

## 2021-08-14 ENCOUNTER — Other Ambulatory Visit (HOSPITAL_BASED_OUTPATIENT_CLINIC_OR_DEPARTMENT_OTHER): Payer: Self-pay

## 2021-08-19 ENCOUNTER — Encounter: Payer: Self-pay | Admitting: Cardiology

## 2021-08-19 ENCOUNTER — Ambulatory Visit: Payer: 59 | Admitting: Cardiology

## 2021-08-19 ENCOUNTER — Other Ambulatory Visit: Payer: Self-pay | Admitting: Family Medicine

## 2021-08-19 ENCOUNTER — Other Ambulatory Visit (HOSPITAL_BASED_OUTPATIENT_CLINIC_OR_DEPARTMENT_OTHER): Payer: Self-pay

## 2021-08-19 VITALS — BP 124/70 | HR 60 | Ht 70.0 in | Wt 203.0 lb

## 2021-08-19 DIAGNOSIS — I2510 Atherosclerotic heart disease of native coronary artery without angina pectoris: Secondary | ICD-10-CM | POA: Diagnosis not present

## 2021-08-19 DIAGNOSIS — Z794 Long term (current) use of insulin: Secondary | ICD-10-CM

## 2021-08-19 DIAGNOSIS — E291 Testicular hypofunction: Secondary | ICD-10-CM

## 2021-08-19 DIAGNOSIS — E1142 Type 2 diabetes mellitus with diabetic polyneuropathy: Secondary | ICD-10-CM

## 2021-08-19 DIAGNOSIS — E785 Hyperlipidemia, unspecified: Secondary | ICD-10-CM | POA: Diagnosis not present

## 2021-08-19 MED ORDER — TESTOSTERONE 50 MG/5GM (1%) TD GEL
TRANSDERMAL | 5 refills | Status: DC
  Filled 2021-08-19 – 2021-09-26 (×2): qty 300, 30d supply, fill #0
  Filled 2021-11-12: qty 300, 30d supply, fill #1

## 2021-08-19 NOTE — Progress Notes (Signed)
?Cardiology Office Note:   ? ?Date:  08/19/2021  ? ?ID:  [name], DOB Sep 16, 1955, MRN [id number] ? ?PCP:  [name], DO  ?Cardiologist:  Shirlee More, MD   ? ?Referring MD: [name]*  ? ? ?ASSESSMENT:   ? ?1. Coronary artery disease involving native coronary artery of native heart without angina pectoris   ?2. Type 2 diabetes mellitus with diabetic polyneuropathy, with long-term current use of insulin ([place])   ?3. Hyperlipidemia, unspecified hyperlipidemia type   ? ?PLAN:   ? ?In order of problems listed above: ? ?Although not having angina he finds himself unduly fatigued with physical effort and will undergo stress echocardiogram with his previous cardiomyopathy PCI and stent for reassurance ?For now we will stop his statin, uncertain whether it is contributing to his diffuse joint tendon and muscle problems.  If not improved off his statin I think he benefit from seeing a rheumatologist ?Paradoxically his diabetes is not tightly controlled ? ? ?Next appointment: 6 months ? ? ?Medication Adjustments/Labs and Tests Ordered: ?Current medicines are reviewed at length with the patient today.  Concerns regarding medicines are outlined above.  ?No orders of the defined types were placed in this encounter. ? ?No orders of the defined types were placed in this encounter. ? ? ?Chief Complaint  ?Patient presents with  ? Follow-up  ? Coronary Artery Disease  ? ? ?History of Present Illness:   ? ?Reginald Rios is a 66 y.o. male with a hx of CAD with PCI and stenting left circumflex coronary artery 06/23/2017, type 2 diabetes mellitus, hyperlipidemia, sinus bradycardia and previous mild decrease in ejection fraction 50 to 55% most recently March 2021 ejection fraction 57%  last seen 07/01/2020. ?Compliance with diet, lifestyle and medications: Yes ? ?He is not doing well paradoxically his diabetes is tightly controlled A1c from 8.2-6.2 with intense lifestyle program and he feels poorly  with marked fatigue exercise after activity and diffuse joint problems as well as tendon injuries he questions whether statin is part of his problem he will discontinue for 30 days let me know in a few weeks if he is improved and if he is we will avoid statins from this point forward.  His recent lipid profile was exemplary in February LDL 61 HDL 51 cholesterol 124 triglycerides 50 ?He has had no angina edema shortness of breath palpitation or syncope ?Past Medical History:  ?Diagnosis Date  ? ADD (attention deficit disorder)   ? Anemia   ? Arthritis   ? CAD (coronary artery disease)   ? 2/19 PCI/DESx1 to Lcx  ? Diabetes mellitus without complication ([place])   ? Exercise-induced asthma   ? Heart murmur   ? Hyperlipidemia   ? family hx of high cholesterol  ? Hypogonadism in male   ? Low back pain   ? Osteoarthritis   ? ? ?Past Surgical History:  ?Procedure Laterality Date  ? CORONARY STENT INTERVENTION N/A 06/23/2017  ? Procedure: CORONARY STENT INTERVENTION;  Surgeon: Leonie Man, MD;  Location: [facility];  Service: Cardiovascular;  Laterality: N/A;  ? INTRAVASCULAR PRESSURE WIRE/FFR STUDY N/A 06/23/2017  ? Procedure: INTRAVASCULAR PRESSURE WIRE/FFR STUDY;  Surgeon: Leonie Man, MD;  Location: [facility];  Service: Cardiovascular;  Laterality: N/A;  ? KNEE ARTHROSCOPY    ? LEFT HEART CATH AND CORONARY ANGIOGRAPHY N/A 06/23/2017  ? Procedure: LEFT HEART CATH AND CORONARY ANGIOGRAPHY;  Surgeon: Leonie Man, MD;  Location: [facility];  Service: Cardiovascular;  Laterality: N/A;  ? SHOULDER ARTHROSCOPY WITH BICEPSTENOTOMY Right 09/06/2019  ? Procedure: SHOULDER ARTHROSCOPY WITH BICEPSTENOTOMY;  Surgeon: Leandrew Koyanagi, MD;  Location: [facility];  Service: Orthopedics;  Laterality: Right;  ? SHOULDER ARTHROSCOPY WITH SUBACROMIAL DECOMPRESSION Right 09/06/2019  ? Procedure: RIGHT SHOULDER ARTHROSCOPY WITH EXTENSIVE DEBRIDEMENT, SUBACROMIAL DECOMPRESSION, BICEPS TENOTOMY;   Surgeon: Leandrew Koyanagi, MD;  Location: [facility];  Service: Orthopedics;  Laterality: Right;  ? TENOTOMY ACHILLES TENDON    ? ? ?Current Medications: ?Current Meds  ?Medication Sig  ? albuterol (VENTOLIN HFA) 108 (90 Base) MCG/ACT inhaler Inhale 2 puffs by mouth every 4-6 hours as needed for coughing or wheezing spells  ? aspirin EC 81 MG tablet Take 81 mg by mouth daily.  ? azelastine (ASTELIN) 0.1 % nasal spray Place 2 sprays into both nostrils 2 (two) times daily as needed for rhinitis (nasal congestion). Use in each nostril as directed  ? baclofen (LIORESAL) 10 MG tablet Take 1 tablet (10 mg total) by mouth 3 (three) times daily as needed for muscle spasms.  ? Blood Glucose Monitoring Suppl (FREESTYLE FREEDOM LITE) w/Device KIT Use to check blood sugar daily.  ? clopidogrel (PLAVIX) 75 MG tablet TAKE 1 TABLET BY MOUTH DAILY WITH BREAKFAST  ? Coenzyme Q10 200 MG capsule Take 200 mg by mouth daily.  ? COVID-19 mRNA bivalent vaccine, Pfizer, injection Inject into the muscle.  ? diclofenac Sodium (VOLTAREN) 1 % GEL Apply 2 g topically 4 (four) times daily.  ? ezetimibe (ZETIA) 10 MG tablet TAKE 1 TABLET BY MOUTH ONCE DAILY  ? fluticasone (FLOVENT HFA) 110 MCG/ACT inhaler Inhale 2 puffs into the lungs 2 (two) times daily. Use with spacer. Rinse, gargle, and spit after use.  ? glimepiride (AMARYL) 1 MG tablet Take 1 tablet (1 mg total) by mouth daily with breakfast.  ? glucose blood (FREESTYLE LITE) test strip Use daily to check blood sugar.  ? influenza vaccine adjuvanted (FLUAD) 0.5 ML injection Inject into the muscle.  ? Lancets (FREESTYLE) lancets Use daily to check blood sugar  ? lidocaine (LIDODERM) 5 % Place 1 patch onto the skin daily. Remove & Discard patch within 12 hours or as directed by MD  ? losartan (COZAAR) 25 MG tablet Take 1 tablet (25 mg total) by mouth daily.  ? metFORMIN (GLUCOPHAGE) 1000 MG tablet TAKE 1 TABLET BY MOUTH 2 TIMES DAILY WITH A MEAL  ? methylphenidate (RITALIN) 20 MG  tablet Take 1 tablet (20 mg total) by mouth 2 (two) times daily.  ? montelukast (SINGULAIR) 10 MG tablet TAKE 1 TABLET (10 MG TOTAL) BY MOUTH DAILY.  ? nitroGLYCERIN (NITROSTAT) 0.4 MG SL tablet Place 1 tablet (0.4 mg total) under the tongue every 5 (five) minutes as needed.  ? oxyCODONE-acetaminophen (PERCOCET/ROXICET) 5-325 MG tablet Take 1 tablet by mouth every 8 (eight) hours as needed for severe pain.  ? pantoprazole (PROTONIX) 40 MG tablet Take 1 tablet (40 mg total) by mouth daily.  ? pioglitazone (ACTOS) 45 MG tablet Take 1 tablet (45 mg total) by mouth daily.  ? pravastatin (PRAVACHOL) 20 MG tablet TAKE 1 TABLET (20 MG TOTAL) BY MOUTH DAILY.  ? tadalafil (CIALIS) 20 MG tablet Take 0.5-1 tablets (10-20 mg total) by mouth every other day as needed for erectile dysfunction.  ? testosterone (ANDROGEL) 50 MG/5GM (1%) GEL APPLY 2 APPLICATIONS ON TO THE SKIN DAILY  ?  ? ?Allergies:   Invokana [canagliflozin], Grass pollen(k-o-r-t-swt vern), and Lisinopril  ? ?  Social History  ? ?Socioeconomic History  ? Marital status: Married  ?  Spouse name: Not on file  ? Number of children: Not on file  ? Years of education: Not on file  ? Highest education level: Not on file  ?Occupational History  ? Occupation: Air cabin crew   ?Tobacco Use  ? Smoking status: Never  ? Smokeless tobacco: Never  ?Vaping Use  ? Vaping Use: Never used  ?Substance and Sexual Activity  ? Alcohol use: Yes  ?  Alcohol/week: 2.0 standard drinks  ?  Types: 2 Glasses of wine per week  ?  Comment: rare  ? Drug use: No  ? Sexual activity: Not on file  ?Other Topics Concern  ? Not on file  ?Social History Narrative  ? Previously worked as Geophysicist/field seismologist  ? Currently working at [employer]  ? Married 30 years  ? 2 children ages 81, 53  ? Wife is psychiatrist - Dr. Margurite Auerbach  ? Moved from [place]  ? Grew up in [place]  ? ?Social Determinants of Health  ? ?Financial Resource Strain: Not on file  ?Food Insecurity: Not on file  ?Transportation Needs: Not  on file  ?Physical Activity: Not on file  ?Stress: Not on file  ?Social Connections: Not on file  ?  ? ?Family History: ?The patient's family history includes Asthma in his paternal aunt; Cancer in his mot

## 2021-08-19 NOTE — Patient Instructions (Signed)
Medication Instructions:  ?Your physician recommends that you continue on your current medications as directed. Please refer to the Current Medication list given to you today. ? ?*If you need a refill on your cardiac medications before your next appointment, please call your pharmacy* ? ? ?Lab Work: ?None ?If you have labs (blood work) drawn today and your tests are completely normal, you will receive your results only by: ?MyChart Message (if you have MyChart) OR ?A paper copy in the mail ?If you have any lab test that is abnormal or we need to change your treatment, we will call you to review the results. ? ? ?Testing/Procedures: ?Your physician has requested that you have a stress echocardiogram. For further information please visit [email or web address]. Please follow instruction sheet as given.  ? ? ?Follow-Up: ?At [facility], you and your health needs are our priority.  As part of our continuing mission to provide you with exceptional heart care, we have created designated Provider Care Teams.  These Care Teams include your primary Cardiologist (physician) and Advanced Practice Providers (APPs -  Physician Assistants and Nurse Practitioners) who all work together to provide you with the care you need, when you need it. ? ?We recommend signing up for the patient portal called "MyChart".  Sign up information is provided on this After Visit Summary.  MyChart is used to connect with patients for Virtual Visits (Telemedicine).  Patients are able to view lab/test results, encounter notes, upcoming appointments, etc.  Non-urgent messages can be sent to your provider as well.   ?To learn more about what you can do with MyChart, go to [email or web address].   ? ?Your next appointment:   ?6 month(s) ? ?The format for your next appointment:   ?In Person ? ?Provider:   ?Shirlee More, MD  ? ? ?Other Instructions ?Stop statin medication for 30 days. ? ?Send message to MyChart regarding how he is doing after one month of  being off statins. ?Important Information About Sugar ? ? ? ? ? ? ?

## 2021-09-02 DIAGNOSIS — M9903 Segmental and somatic dysfunction of lumbar region: Secondary | ICD-10-CM | POA: Diagnosis not present

## 2021-09-02 DIAGNOSIS — M9902 Segmental and somatic dysfunction of thoracic region: Secondary | ICD-10-CM | POA: Diagnosis not present

## 2021-09-02 DIAGNOSIS — M545 Low back pain, unspecified: Secondary | ICD-10-CM | POA: Diagnosis not present

## 2021-09-02 DIAGNOSIS — M9901 Segmental and somatic dysfunction of cervical region: Secondary | ICD-10-CM | POA: Diagnosis not present

## 2021-09-04 ENCOUNTER — Encounter: Payer: Self-pay | Admitting: Cardiology

## 2021-09-04 ENCOUNTER — Other Ambulatory Visit (HOSPITAL_BASED_OUTPATIENT_CLINIC_OR_DEPARTMENT_OTHER): Payer: Self-pay

## 2021-09-04 NOTE — Addendum Note (Signed)
Addended by: Jacobo Forest D on: 09/04/2021 02:47 PM ? ? Modules accepted: Orders ? ?

## 2021-09-04 NOTE — Addendum Note (Signed)
Addended by: Shirlee More on: 09/04/2021 08:19 PM ? ? Modules accepted: Orders ? ?

## 2021-09-05 ENCOUNTER — Other Ambulatory Visit (HOSPITAL_BASED_OUTPATIENT_CLINIC_OR_DEPARTMENT_OTHER): Payer: Self-pay

## 2021-09-08 ENCOUNTER — Other Ambulatory Visit (HOSPITAL_BASED_OUTPATIENT_CLINIC_OR_DEPARTMENT_OTHER): Payer: Self-pay

## 2021-09-15 ENCOUNTER — Ambulatory Visit (INDEPENDENT_AMBULATORY_CARE_PROVIDER_SITE_OTHER): Payer: 59

## 2021-09-15 ENCOUNTER — Other Ambulatory Visit (HOSPITAL_COMMUNITY): Payer: Self-pay

## 2021-09-15 ENCOUNTER — Ambulatory Visit: Payer: 59 | Admitting: Family Medicine

## 2021-09-15 VITALS — BP 130/78 | HR 82 | Ht 70.0 in | Wt 197.4 lb

## 2021-09-15 DIAGNOSIS — M79671 Pain in right foot: Secondary | ICD-10-CM | POA: Diagnosis not present

## 2021-09-15 DIAGNOSIS — M2021 Hallux rigidus, right foot: Secondary | ICD-10-CM

## 2021-09-15 HISTORY — DX: Hallux rigidus, right foot: M20.21

## 2021-09-15 NOTE — Patient Instructions (Addendum)
Thank you for coming in today.  ? ?Please get an Xray today before you leave  ? ?Please use Voltaren gel (Generic Diclofenac Gel) up to 4x daily for pain as needed.  This is available over-the-counter as both the name brand Voltaren gel and the generic diclofenac gel.  ? ?Recheck in 1 month.  ? ?Get a [employer] insole.  ?Do a Producer, television/film/video for [employer] ? ? ? ?

## 2021-09-15 NOTE — Progress Notes (Signed)
? ?  I, Peterson Lombard, LAT, ATC acting as a scribe for Lynne Leader, MD. ? ?AIME MELOCHE is a 66 y.o. male who presents to [place] at [facility] today for [employer] toe pain. Pt was last seen by Dr. Georgina Snell on 08/12/21 for R elbow pain due to overuse/DOMs. Today, pt c/o R Great toe pain x 1.5 weeks ago, w/ no MOI. Pt plays pickleball and went for a hike last night Pt locates pain to the distal segment of the R Great toe. He notes that swelling has improved w/ icing.  ? ?R Great toe swelling: yes ?Aggravates: uphill walking, and pushing off, DF (like when driving) ?Treatments tried: ice, changed shoes, Voltaren gel ? ?Dx imaging: 10/12/19 R foot XR ? ?Pertinent review of systems: No fevers or chills ? ?Relevant historical information: Diabetes ? ? ?Exam:  ?BP 130/78   Pulse 82   Ht '5\' 10"'$  (1.778 m)   Wt 197 lb 6.4 oz (89.5 kg)   SpO2 97%   BMI 28.32 kg/m?  ?General: Well Developed, well nourished, and in no acute distress.  ? ?MSK: Right foot: Hammertoe deformity second toe.  Great toe minimally erythematous.  Nontender. ?Decreased great toe motion with significant limitation range of motion flexion and extension. ?Pulses and capillary fill are intact distally. ? ? ?Lab and Radiology Results ? ?X-ray images right foot obtained today personally and independently interpreted. ?Moderate DJD great toe IP joint and mild DJD MTP joint great toe.  No acute fractures are present. ?Await formal radiology review ? ? ? ?Assessment and Plan: ?66 y.o. male with right great toe pain thought to be due to hallux rigidus.  Plan for turf toe insole Voltaren gel.  Recheck in 1 month. ? ? ?PDMP not reviewed this encounter. ?Orders Placed This Encounter  ?Procedures  ? DG Foot Complete Right  ?  Standing Status:   Future  ?  Number of Occurrences:   1  ?  Standing Expiration Date:   09/16/2022  ?  Order Specific Question:   Reason for Exam (SYMPTOM  OR DIAGNOSIS REQUIRED)  ?  Answer:   right foot pain  ?  Order  Specific Question:   Preferred imaging location?  ?  Answer:   [facility]  ? ?No orders of the defined types were placed in this encounter. ? ? ? ?Discussed warning signs or symptoms. Please see discharge instructions. Patient expresses understanding. ? ? ?The above documentation has been reviewed and is accurate and complete Lynne Leader, M.D. ? ? ?

## 2021-09-17 NOTE — Progress Notes (Signed)
Foot x-ray looks largely normal to radiology

## 2021-09-22 ENCOUNTER — Other Ambulatory Visit (HOSPITAL_BASED_OUTPATIENT_CLINIC_OR_DEPARTMENT_OTHER): Payer: Self-pay

## 2021-09-22 ENCOUNTER — Telehealth: Payer: Self-pay | Admitting: Radiology

## 2021-09-22 ENCOUNTER — Other Ambulatory Visit: Payer: Self-pay | Admitting: Orthopaedic Surgery

## 2021-09-22 MED ORDER — DICLOFENAC SODIUM 1.5 % EX SOLN
CUTANEOUS | 3 refills | Status: DC
  Filled 2021-09-22: qty 150, 14d supply, fill #0

## 2021-09-22 NOTE — Telephone Encounter (Signed)
Received call from [facility] with [place] at [facility] requesting clarification on Diclofenac Sodium 1.5% solution sent in today. She states that directions say to apply 2g, however, it is a solution and requires drops. Please call Vicente Males to advise.  [phone]

## 2021-09-22 NOTE — Telephone Encounter (Signed)
Pharmacy aware this was [place] and it is drops not grams

## 2021-09-23 ENCOUNTER — Other Ambulatory Visit (HOSPITAL_BASED_OUTPATIENT_CLINIC_OR_DEPARTMENT_OTHER): Payer: Self-pay

## 2021-09-26 ENCOUNTER — Other Ambulatory Visit (HOSPITAL_BASED_OUTPATIENT_CLINIC_OR_DEPARTMENT_OTHER): Payer: Self-pay

## 2021-10-02 ENCOUNTER — Other Ambulatory Visit: Payer: Self-pay | Admitting: Internal Medicine

## 2021-10-02 ENCOUNTER — Other Ambulatory Visit (HOSPITAL_BASED_OUTPATIENT_CLINIC_OR_DEPARTMENT_OTHER): Payer: Self-pay

## 2021-10-02 ENCOUNTER — Telehealth (HOSPITAL_COMMUNITY): Payer: Self-pay | Admitting: *Deleted

## 2021-10-02 DIAGNOSIS — J3089 Other allergic rhinitis: Secondary | ICD-10-CM

## 2021-10-02 MED ORDER — AZELASTINE HCL 0.1 % NA SOLN
2.0000 | Freq: Two times a day (BID) | NASAL | 3 refills | Status: DC | PRN
  Filled 2021-10-02: qty 30, 50d supply, fill #0
  Filled 2022-03-10: qty 30, 50d supply, fill #1
  Filled 2022-08-10: qty 30, 50d supply, fill #2

## 2021-10-02 NOTE — Telephone Encounter (Signed)
Patient given instructions for upcoming stress echo.  Reginald Rios Jacqueline  

## 2021-10-08 ENCOUNTER — Ambulatory Visit (HOSPITAL_BASED_OUTPATIENT_CLINIC_OR_DEPARTMENT_OTHER): Payer: 59

## 2021-10-08 ENCOUNTER — Ambulatory Visit (HOSPITAL_COMMUNITY): Payer: 59 | Attending: Cardiovascular Disease

## 2021-10-08 ENCOUNTER — Other Ambulatory Visit (HOSPITAL_BASED_OUTPATIENT_CLINIC_OR_DEPARTMENT_OTHER): Payer: Self-pay

## 2021-10-08 DIAGNOSIS — I2510 Atherosclerotic heart disease of native coronary artery without angina pectoris: Secondary | ICD-10-CM

## 2021-10-08 DIAGNOSIS — E785 Hyperlipidemia, unspecified: Secondary | ICD-10-CM | POA: Insufficient documentation

## 2021-10-08 DIAGNOSIS — Z794 Long term (current) use of insulin: Secondary | ICD-10-CM

## 2021-10-08 DIAGNOSIS — E1142 Type 2 diabetes mellitus with diabetic polyneuropathy: Secondary | ICD-10-CM | POA: Diagnosis not present

## 2021-10-08 MED ORDER — PERFLUTREN LIPID MICROSPHERE
1.0000 mL | INTRAVENOUS | Status: AC | PRN
  Administered 2021-10-08: 3 mL via INTRAVENOUS

## 2021-10-13 ENCOUNTER — Ambulatory Visit: Payer: 59 | Admitting: Family Medicine

## 2021-10-13 DIAGNOSIS — H31091 Other chorioretinal scars, right eye: Secondary | ICD-10-CM | POA: Diagnosis not present

## 2021-10-13 DIAGNOSIS — E119 Type 2 diabetes mellitus without complications: Secondary | ICD-10-CM | POA: Diagnosis not present

## 2021-10-13 DIAGNOSIS — H2513 Age-related nuclear cataract, bilateral: Secondary | ICD-10-CM | POA: Diagnosis not present

## 2021-10-13 DIAGNOSIS — H26111 Localized traumatic opacities, right eye: Secondary | ICD-10-CM | POA: Diagnosis not present

## 2021-10-13 LAB — HM DIABETES EYE EXAM

## 2021-10-14 ENCOUNTER — Encounter: Payer: Self-pay | Admitting: Family Medicine

## 2021-10-16 ENCOUNTER — Ambulatory Visit: Payer: Self-pay

## 2021-10-16 ENCOUNTER — Ambulatory Visit (INDEPENDENT_AMBULATORY_CARE_PROVIDER_SITE_OTHER): Payer: 59 | Admitting: Family Medicine

## 2021-10-16 VITALS — BP 152/76 | HR 84 | Ht 70.0 in | Wt 196.4 lb

## 2021-10-16 DIAGNOSIS — M2021 Hallux rigidus, right foot: Secondary | ICD-10-CM

## 2021-10-16 NOTE — Patient Instructions (Addendum)
Thank you for coming in today.   You received an injection today. Seek immediate medical attention if the joint becomes red, extremely painful, or is oozing fluid.   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

## 2021-10-16 NOTE — Progress Notes (Signed)
I, [name], LAT, ATC, am serving as scribe for Dr. Lynne Leader.  Reginald Rios is a 66 y.o. male who presents to [place] at [facility] today for f/u of R great toe pain thought to be due to hallux rigidus. He was last seen by Dr. Georgina Snell and was advised to purchase a turf toe insole and to use Voltaren gel.  Today, pt reports R Great toe is still painful, but doing better than at his last visit. Pt notes he is still unable to flex the Great toe and it has continued to swell. Pt is wearing the turf toe insert.  He has had to cut the insole short because it was too big and cut the toe end of the insole that it no longer extends to the end of his shoe.  Diagnostic testing: R foot XR- 09/15/21  Pertinent review of systems: No fevers or chills  Relevant historical information: Diabetes   Exam:  BP (!) 152/76   Pulse 84   Ht '5\' 10"'$  (1.778 m)   Wt 196 lb 6.4 oz (89.1 kg)   SpO2 97%   BMI 28.18 kg/m  General: Well Developed, well nourished, and in no acute distress.   MSK: Right toe: Swollen and mildly tender palpation at right IP joint great toe.  Decreased range of motion.    Lab and Radiology Results  Procedure: Real-time Ultrasound Guided Injection of right great toe IP joint dorsal medial aspect Device: Philips Affiniti 50G Images permanently stored and available for review in PACS Verbal informed consent obtained.  Discussed risks and benefits of procedure. Warned about infection, bleeding, hyperglycemia damage to structures among others. Patient expresses understanding and agreement Time-out conducted.   Noted no overlying erythema, induration, or other signs of local infection.   Skin prepped in a sterile fashion.   Local anesthesia: Topical Ethyl chloride.   With sterile technique and under real time ultrasound guidance: 0.5 mL of 80 mg/mL Depo-Medrol solution and 0.5 mL of lidocaine injected into IP joint. Fluid seen entering the joint capsule.    Completed without difficulty   Pain immediately resolved suggesting accurate placement of the medication.   Advised to call if fevers/chills, erythema, induration, drainage, or persistent bleeding.   Images permanently stored and available for review in the ultrasound unit.  Impression: Technically successful ultrasound guided injection.   EXAM: RIGHT FOOT COMPLETE - 3+ VIEW   COMPARISON:  X-ray 10/12/2019.   FINDINGS: There is no evidence of fracture or dislocation. There is no evidence of arthropathy or other focal bone abnormality. Soft tissues are unremarkable.   IMPRESSION: No acute abnormality noted.     Electronically Signed   By: Inez Catalina M.D.   On: 09/16/2021 22:48   I, Lynne Leader, personally (independently) visualized and performed the interpretation of the images attached in this note.  Lab Results  Component Value Date   LABURIC 4.0 10/05/2016        Assessment and Plan: 66 y.o. male with right great toe pain thought to be due to hallux rigidus or pain or irritation at the IP joint.  Plan for injection today.  He has an extra or turf toe insole so we talked about how to modify it little more effectively for his shoe additionally he may benefit from a Morton's extension type turf toe insole.  Recheck back as needed.   PDMP not reviewed this encounter. Orders Placed This Encounter  Procedures   [place] LIMITED JOINT SPACE  STRUCTURES LOW RIGHT(NO LINKED CHARGES)    Order Specific Question:   Reason for Exam (SYMPTOM  OR DIAGNOSIS REQUIRED)    Answer:   right toe pain    Order Specific Question:   Preferred imaging location?    Answer:   [place]   No orders of the defined types were placed in this encounter.    Discussed warning signs or symptoms. Please see discharge instructions. Patient expresses understanding.   The above documentation has been reviewed and is accurate and complete Lynne Leader, M.D.

## 2021-10-17 ENCOUNTER — Other Ambulatory Visit (HOSPITAL_BASED_OUTPATIENT_CLINIC_OR_DEPARTMENT_OTHER): Payer: Self-pay

## 2021-10-17 ENCOUNTER — Other Ambulatory Visit: Payer: Self-pay | Admitting: Family Medicine

## 2021-10-17 MED ORDER — METHYLPHENIDATE HCL 20 MG PO TABS
20.0000 mg | ORAL_TABLET | Freq: Two times a day (BID) | ORAL | 0 refills | Status: DC
  Filled 2021-10-17: qty 60, 30d supply, fill #0

## 2021-10-17 MED ORDER — METHYLPHENIDATE HCL 20 MG PO TABS
20.0000 mg | ORAL_TABLET | Freq: Two times a day (BID) | ORAL | 0 refills | Status: DC
Start: 2021-12-16 — End: 2021-12-25

## 2021-10-17 MED ORDER — METHYLPHENIDATE HCL 20 MG PO TABS
20.0000 mg | ORAL_TABLET | Freq: Two times a day (BID) | ORAL | 0 refills | Status: DC
Start: 2021-11-16 — End: 2021-12-25

## 2021-10-21 ENCOUNTER — Other Ambulatory Visit (HOSPITAL_BASED_OUTPATIENT_CLINIC_OR_DEPARTMENT_OTHER): Payer: Self-pay

## 2021-10-21 ENCOUNTER — Other Ambulatory Visit: Payer: Self-pay | Admitting: Internal Medicine

## 2021-10-21 DIAGNOSIS — M542 Cervicalgia: Secondary | ICD-10-CM | POA: Diagnosis not present

## 2021-10-21 DIAGNOSIS — M545 Low back pain, unspecified: Secondary | ICD-10-CM | POA: Diagnosis not present

## 2021-10-21 DIAGNOSIS — M9901 Segmental and somatic dysfunction of cervical region: Secondary | ICD-10-CM | POA: Diagnosis not present

## 2021-10-21 DIAGNOSIS — J45990 Exercise induced bronchospasm: Secondary | ICD-10-CM

## 2021-10-21 DIAGNOSIS — M9903 Segmental and somatic dysfunction of lumbar region: Secondary | ICD-10-CM | POA: Diagnosis not present

## 2021-10-22 ENCOUNTER — Other Ambulatory Visit (HOSPITAL_BASED_OUTPATIENT_CLINIC_OR_DEPARTMENT_OTHER): Payer: Self-pay

## 2021-10-29 ENCOUNTER — Other Ambulatory Visit (HOSPITAL_BASED_OUTPATIENT_CLINIC_OR_DEPARTMENT_OTHER): Payer: Self-pay

## 2021-10-30 ENCOUNTER — Other Ambulatory Visit (HOSPITAL_BASED_OUTPATIENT_CLINIC_OR_DEPARTMENT_OTHER): Payer: Self-pay

## 2021-11-05 ENCOUNTER — Other Ambulatory Visit (HOSPITAL_BASED_OUTPATIENT_CLINIC_OR_DEPARTMENT_OTHER): Payer: Self-pay

## 2021-11-05 DIAGNOSIS — H2513 Age-related nuclear cataract, bilateral: Secondary | ICD-10-CM | POA: Diagnosis not present

## 2021-11-05 DIAGNOSIS — H2512 Age-related nuclear cataract, left eye: Secondary | ICD-10-CM | POA: Diagnosis not present

## 2021-11-12 ENCOUNTER — Other Ambulatory Visit (HOSPITAL_BASED_OUTPATIENT_CLINIC_OR_DEPARTMENT_OTHER): Payer: Self-pay

## 2021-11-18 DIAGNOSIS — M9901 Segmental and somatic dysfunction of cervical region: Secondary | ICD-10-CM | POA: Diagnosis not present

## 2021-11-18 DIAGNOSIS — M9903 Segmental and somatic dysfunction of lumbar region: Secondary | ICD-10-CM | POA: Diagnosis not present

## 2021-11-18 DIAGNOSIS — M545 Low back pain, unspecified: Secondary | ICD-10-CM | POA: Diagnosis not present

## 2021-11-18 DIAGNOSIS — M9904 Segmental and somatic dysfunction of sacral region: Secondary | ICD-10-CM | POA: Diagnosis not present

## 2021-12-02 DIAGNOSIS — H2511 Age-related nuclear cataract, right eye: Secondary | ICD-10-CM | POA: Diagnosis not present

## 2021-12-02 DIAGNOSIS — H269 Unspecified cataract: Secondary | ICD-10-CM | POA: Diagnosis not present

## 2021-12-02 DIAGNOSIS — H26111 Localized traumatic opacities, right eye: Secondary | ICD-10-CM | POA: Diagnosis not present

## 2021-12-02 DIAGNOSIS — E119 Type 2 diabetes mellitus without complications: Secondary | ICD-10-CM | POA: Diagnosis not present

## 2021-12-11 DIAGNOSIS — H2512 Age-related nuclear cataract, left eye: Secondary | ICD-10-CM | POA: Diagnosis not present

## 2021-12-14 ENCOUNTER — Other Ambulatory Visit: Payer: Self-pay | Admitting: Internal Medicine

## 2021-12-14 DIAGNOSIS — K219 Gastro-esophageal reflux disease without esophagitis: Secondary | ICD-10-CM

## 2021-12-15 ENCOUNTER — Other Ambulatory Visit (HOSPITAL_BASED_OUTPATIENT_CLINIC_OR_DEPARTMENT_OTHER): Payer: Self-pay

## 2021-12-15 MED ORDER — PANTOPRAZOLE SODIUM 40 MG PO TBEC
40.0000 mg | DELAYED_RELEASE_TABLET | Freq: Every day | ORAL | 3 refills | Status: DC
  Filled 2021-12-15: qty 90, 90d supply, fill #0
  Filled 2022-03-10: qty 30, 30d supply, fill #1

## 2021-12-16 DIAGNOSIS — M545 Low back pain, unspecified: Secondary | ICD-10-CM | POA: Diagnosis not present

## 2021-12-16 DIAGNOSIS — M9902 Segmental and somatic dysfunction of thoracic region: Secondary | ICD-10-CM | POA: Diagnosis not present

## 2021-12-16 DIAGNOSIS — M9903 Segmental and somatic dysfunction of lumbar region: Secondary | ICD-10-CM | POA: Diagnosis not present

## 2021-12-16 DIAGNOSIS — M9901 Segmental and somatic dysfunction of cervical region: Secondary | ICD-10-CM | POA: Diagnosis not present

## 2021-12-23 DIAGNOSIS — H269 Unspecified cataract: Secondary | ICD-10-CM | POA: Diagnosis not present

## 2021-12-23 DIAGNOSIS — E119 Type 2 diabetes mellitus without complications: Secondary | ICD-10-CM | POA: Diagnosis not present

## 2021-12-23 DIAGNOSIS — H2512 Age-related nuclear cataract, left eye: Secondary | ICD-10-CM | POA: Diagnosis not present

## 2021-12-24 ENCOUNTER — Other Ambulatory Visit (HOSPITAL_BASED_OUTPATIENT_CLINIC_OR_DEPARTMENT_OTHER): Payer: Self-pay

## 2021-12-24 MED ORDER — TIMOLOL MALEATE 0.5 % OP SOLN
OPHTHALMIC | 0 refills | Status: DC
  Filled 2021-12-24 (×3): qty 5, 50d supply, fill #0

## 2021-12-24 MED ORDER — TIMOLOL MALEATE 0.5 % OP SOLN
OPHTHALMIC | 0 refills | Status: DC
Start: 2021-12-24 — End: 2022-01-26
  Filled 2021-12-24: qty 5, 50d supply, fill #0

## 2021-12-25 ENCOUNTER — Other Ambulatory Visit (HOSPITAL_BASED_OUTPATIENT_CLINIC_OR_DEPARTMENT_OTHER): Payer: Self-pay

## 2021-12-25 ENCOUNTER — Other Ambulatory Visit: Payer: Self-pay | Admitting: Family Medicine

## 2021-12-25 MED ORDER — METHYLPHENIDATE HCL 20 MG PO TABS
20.0000 mg | ORAL_TABLET | Freq: Two times a day (BID) | ORAL | 0 refills | Status: DC
  Filled 2021-12-25: qty 60, 30d supply, fill #0

## 2021-12-25 MED ORDER — METHYLPHENIDATE HCL 20 MG PO TABS: 20.0000 mg | ORAL_TABLET | Freq: Two times a day (BID) | ORAL | 0 refills | Status: DC

## 2022-01-04 ENCOUNTER — Other Ambulatory Visit: Payer: Self-pay | Admitting: Cardiology

## 2022-01-06 ENCOUNTER — Other Ambulatory Visit (HOSPITAL_BASED_OUTPATIENT_CLINIC_OR_DEPARTMENT_OTHER): Payer: Self-pay

## 2022-01-06 MED ORDER — CLOPIDOGREL BISULFATE 75 MG PO TABS
ORAL_TABLET | Freq: Every day | ORAL | 1 refills | Status: DC
  Filled 2022-01-06 (×2): qty 90, 90d supply, fill #0
  Filled 2022-04-09: qty 90, 90d supply, fill #1

## 2022-01-06 MED ORDER — EZETIMIBE 10 MG PO TABS
ORAL_TABLET | Freq: Every day | ORAL | 1 refills | Status: DC
  Filled 2022-01-06 (×2): qty 90, 90d supply, fill #0
  Filled 2022-04-09: qty 90, 90d supply, fill #1

## 2022-01-08 DIAGNOSIS — M5412 Radiculopathy, cervical region: Secondary | ICD-10-CM | POA: Diagnosis not present

## 2022-01-08 DIAGNOSIS — M545 Low back pain, unspecified: Secondary | ICD-10-CM | POA: Diagnosis not present

## 2022-01-08 DIAGNOSIS — M9901 Segmental and somatic dysfunction of cervical region: Secondary | ICD-10-CM | POA: Diagnosis not present

## 2022-01-08 DIAGNOSIS — M9903 Segmental and somatic dysfunction of lumbar region: Secondary | ICD-10-CM | POA: Diagnosis not present

## 2022-01-19 ENCOUNTER — Encounter: Payer: Self-pay | Admitting: Family Medicine

## 2022-01-23 ENCOUNTER — Encounter: Payer: Self-pay | Admitting: Family Medicine

## 2022-01-23 ENCOUNTER — Ambulatory Visit: Payer: 59 | Admitting: Family Medicine

## 2022-01-23 ENCOUNTER — Other Ambulatory Visit (INDEPENDENT_AMBULATORY_CARE_PROVIDER_SITE_OTHER): Payer: 59

## 2022-01-23 ENCOUNTER — Other Ambulatory Visit (HOSPITAL_BASED_OUTPATIENT_CLINIC_OR_DEPARTMENT_OTHER): Payer: Self-pay

## 2022-01-23 VITALS — BP 110/60 | HR 76 | Temp 97.6°F | Resp 18 | Ht 70.0 in | Wt 191.2 lb

## 2022-01-23 DIAGNOSIS — E1165 Type 2 diabetes mellitus with hyperglycemia: Secondary | ICD-10-CM

## 2022-01-23 DIAGNOSIS — D649 Anemia, unspecified: Secondary | ICD-10-CM

## 2022-01-23 DIAGNOSIS — E1100 Type 2 diabetes mellitus with hyperosmolarity without nonketotic hyperglycemic-hyperosmolar coma (NKHHC): Secondary | ICD-10-CM | POA: Diagnosis not present

## 2022-01-23 DIAGNOSIS — Z23 Encounter for immunization: Secondary | ICD-10-CM | POA: Diagnosis not present

## 2022-01-23 DIAGNOSIS — B353 Tinea pedis: Secondary | ICD-10-CM

## 2022-01-23 DIAGNOSIS — E291 Testicular hypofunction: Secondary | ICD-10-CM | POA: Diagnosis not present

## 2022-01-23 DIAGNOSIS — F988 Other specified behavioral and emotional disorders with onset usually occurring in childhood and adolescence: Secondary | ICD-10-CM | POA: Diagnosis not present

## 2022-01-23 LAB — COMPREHENSIVE METABOLIC PANEL
ALT: 20 U/L (ref 0–53)
AST: 19 U/L (ref 0–37)
Albumin: 4.3 g/dL (ref 3.5–5.2)
Alkaline Phosphatase: 63 U/L (ref 39–117)
BUN: 20 mg/dL (ref 6–23)
CO2: 28 mEq/L (ref 19–32)
Calcium: 9.7 mg/dL (ref 8.4–10.5)
Chloride: 104 mEq/L (ref 96–112)
Creatinine, Ser: 0.82 mg/dL (ref 0.40–1.50)
GFR: 90.15 mL/min (ref >60.00)
Glucose, Bld: 111 mg/dL — ABNORMAL HIGH (ref 70–99)
Potassium: 4.3 mEq/L (ref 3.5–5.1)
Sodium: 141 mEq/L (ref 135–145)
Total Bilirubin: 0.5 mg/dL (ref 0.2–1.2)
Total Protein: 7.1 g/dL (ref 6.0–8.3)

## 2022-01-23 LAB — CBC
HCT: 36.4 % — ABNORMAL LOW (ref 39.0–52.0)
Hemoglobin: 12.2 g/dL — ABNORMAL LOW (ref 13.0–17.0)
MCHC: 33.5 g/dL (ref 30.0–36.0)
MCV: 98.7 fl (ref 78.0–100.0)
Platelets: 220 10*3/uL (ref 150.0–400.0)
RBC: 3.68 Mil/uL — ABNORMAL LOW (ref 4.22–5.81)
RDW: 15.1 % (ref 11.5–15.5)
WBC: 6.1 10*3/uL (ref 4.0–10.5)

## 2022-01-23 LAB — LIPID PANEL
Cholesterol: 150 mg/dL (ref 0–200)
HDL: 52.5 mg/dL (ref >39.00)
LDL Cholesterol: 83 mg/dL (ref 0–99)
NonHDL: 97.08
Total CHOL/HDL Ratio: 3
Triglycerides: 70 mg/dL (ref 0.0–149.0)
VLDL: 14 mg/dL (ref 0.0–40.0)

## 2022-01-23 MED ORDER — KETOCONAZOLE 2 % EX CREA
1.0000 | TOPICAL_CREAM | Freq: Every day | CUTANEOUS | 0 refills | Status: AC
  Filled 2022-01-23: qty 30, 30d supply, fill #0

## 2022-01-23 MED ORDER — TESTOSTERONE 50 MG/5GM (1%) TD GEL
TRANSDERMAL | 5 refills | Status: DC
  Filled 2022-01-23: qty 300, 30d supply, fill #0
  Filled 2022-02-24: qty 300, 30d supply, fill #1
  Filled 2022-04-09: qty 300, 30d supply, fill #2
  Filled 2022-05-29 – 2022-06-01 (×2): qty 150, 15d supply, fill #3
  Filled 2022-07-22: qty 300, 30d supply, fill #4

## 2022-01-23 NOTE — Progress Notes (Signed)
Subjective:   Chief Complaint  Patient presents with   6 month follow up    Concerns/ questions:  Flu shot today:     Reginald Rios is a 66 y.o. male here for follow-up of diabetes.   [place]um's self monitored glucose range is low 100's.  Patient denies hypoglycemic reactions. He has a CGM currently.  Patient does not require insulin.   Medications include: Actos 22.5 mg/d, metformin 1000 mg bid Diet is healthy.  Exercise: lifting wts, yoga  ADD Taking Ritalin 20 mg bid. Reports compliance, no AE's. Works well. No twitching, insomnia, decreased appetite.   Past Medical History:  Diagnosis Date   ADD (attention deficit disorder)    Anemia    Arthritis    CAD (coronary artery disease)    2/19 PCI/DESx1 to Lcx   Diabetes mellitus without complication (HCC)    Exercise-induced asthma    Heart murmur    Hyperlipidemia    family hx of high cholesterol   Hypogonadism in male    Low back pain    Osteoarthritis      Related testing: Retinal exam: Done Pneumovax: done  Objective:  BP 110/60 (BP Location: Left Arm, Patient Position: Sitting, Cuff Size: Normal)   Pulse 76   Temp 97.6 F (36.4 C) (Temporal)   Resp 18   Ht '5\' 10"'$  (1.778 m)   Wt 191 lb 3.2 oz (86.7 kg)   SpO2 98%   BMI 27.43 kg/m  General:  Well developed, well nourished, in no apparent distress Skin: Macerated tissue between digits 4/5 on both feet warm, no pallor or diaphoresis Lungs:  CTAB, no access msc use Cardio:  RRR, no bruits, no LE edema Neuro:  Sensation intact to pinprick on feet Psych: Age appropriate judgment and insight  Assessment:   Type 2 diabetes mellitus with hyperglycemia, without long-term current use of insulin (HCC) - Plan: Comprehensive metabolic panel, Lipid panel, Hemoglobin A1c  Attention deficit disorder (ADD) without hyperactivity  Anemia, unspecified type - Plan: CBC  Tinea pedis of both feet - Plan: ketoconazole (NIZORAL) 2 % cream  Hypogonadism in male - Plan:  testosterone (ANDROGEL) 50 MG/5GM (1%) GEL   Plan:   Chronic, probably stable.  Continue Actos 22.5 mg daily, metformin 1000 mg twice daily.  He is doing well, we will stop his Actos.  Counseled on diet and exercise. Chronic, stable.  Continue Ritalin 20 mg twice daily. Refill testosterone 6 weeks of ketoconazole cream. Check CBC, may have a thalassemia. Follow-up in 6 months. The patient voiced understanding and agreement to the plan.  [place], DO 01/23/22 10:34 AM

## 2022-01-23 NOTE — Addendum Note (Signed)
Addended by: CREFT, Darlis Loan on: 01/23/2022 10:49 AM   Modules accepted: Orders

## 2022-01-23 NOTE — Patient Instructions (Signed)
Give us 2-3 business days to get the results of your labs back.   Keep the diet clean and stay active.  Let us know if you need anything. 

## 2022-01-26 ENCOUNTER — Ambulatory Visit: Payer: 59 | Admitting: Pharmacist

## 2022-01-26 DIAGNOSIS — E1165 Type 2 diabetes mellitus with hyperglycemia: Secondary | ICD-10-CM | POA: Diagnosis not present

## 2022-01-26 LAB — HEMOGLOBIN A1C
Hgb A1c MFr Bld: 7.1 % — ABNORMAL HIGH (ref 4.6–6.5)
Hgb A1c MFr Bld: 7.2 % — ABNORMAL HIGH (ref 4.6–6.5)

## 2022-01-26 NOTE — Progress Notes (Unsigned)
Pharmacy Note  01/26/2022 Name: Reginald Rios MRN: [id number] DOB: 04/13/1956  Subjective: Reginald Rios is a 66 y.o. year old male who is a primary care patient of Reginald Pal, DO. Clinical Pharmacist Practitioner referral was placed to assist with medication and diabetes management.    Engaged with patient face to face for initial visit today.  Patient is enrolled in [facility] diabetes management program for [employer] and family. Unfortunately this program will end soon and Reginald Rios is concerned about not being able to continue to use DexCom Continuous Glucose Monitor and that he will not be able to maintain weight loss and improved blood glucose control.  Starting weight was 225lbs and he is not at 190lbs  Current DM therapy: pioglitazone 41m - take 0.5 tablet daily and metformin ER 10077mtwice a day.  Last A1c had increased a little from 6.8% to 7.2%. Though [facility] Patient's goal is to improve his diet, lose weight and be able to decrease diabetes medications. He has already been able to stop Januvia and glimepiride.  Previous medications tried:   FaIran unsure why discontinued (possibly infection)  Januvia and Janumet - unsure but looks like may have been stopped due to improved blood glucose control in March 2023.  Glimepiride - stopped due to improved blood glucose  Ozempic - stopped due to nausea and dizziness.  He is trying to follow diet advice provided by [facility] program but sometimes finds it hard because they recommend foods that have been hard for him to find or that he does not like.  He is exercising regularly - plays pickleball and walks   Objective: Review of patient status, including review of consultants reports, laboratory and other test data, was performed as part of comprehensive evaluation and provision of chronic care management services.   Lab Results  Component Value Date    CREATININE 0.9 06/20/2021   CREATININE 0.87 04/21/2021   CREATININE 0.76 10/15/2020    Lab Results  Component Value Date   HGBA1C 7.2 (H) 01/23/2022       Component Value Date/Time   CHOL 124 06/20/2021 0000   CHOL 150 04/21/2017 1019   TRIG 50 06/20/2021 0000   HDL 51 06/20/2021 0000   HDL 53 04/21/2017 1019   CHOLHDL 3 04/21/2021 1011   VLDL 16.6 04/21/2021 1011   LDLCALC 61 06/20/2021 0000   LDLCALC 74 04/21/2017 1019     Clinical ASCVD: Yes  The ASCVD Risk score (Arnett DK, et al., 2019) failed to calculate for the following reasons:   The valid total cholesterol range is 130 to 320 mg/dL    Patient is noted to be intolerant to statins. Tried pravastatin in past. Per patient cause muscle aches.   BP Readings from Last 3 Encounters:  01/23/22 110/60  10/16/21 (!) 152/76  09/15/21 130/78     Allergies  Allergen Reactions   Invokana [Canagliflozin] Rash   Grass Pollen(K-O-R-T-Swt Vern)    Lisinopril Cough    Medications Reviewed Today     Reviewed by EcCherre RobinsRPH-CPP (Pharmacist) on 01/26/22 at 16Heartwellist Status: <None>   Medication Order Taking? Sig Documenting Provider Last Dose Status Informant  albuterol (VENTOLIN HFA) 108 (90 Base) MCG/ACT inhaler [id number] Inhale 2 puffs by mouth every 4-6 hours as needed for coughing or wheezing spells DeClemon ChambersMD Taking Active   aspirin EC 81 MG tablet [id number] Take  81 mg by mouth daily. [provider] Taking Active Self  azelastine (ASTELIN) 0.1 % nasal spray [id number]  Place 2 sprays into both nostrils 2 (two) times daily as needed for rhinitis (nasal congestion). Clemon Chambers, MD  Active   baclofen (LIORESAL) 10 MG tablet [id number] No Take 1 tablet (10 mg total) by mouth 3 (three) times daily as needed for muscle spasms.  Patient not taking: Reported on 01/26/2022   Gregor Hams, MD Not Taking Active   Blood Glucose Monitoring Suppl (FREESTYLE FREEDOM LITE) w/Device KIT [id number]  No Use to check blood sugar daily.  Patient not taking: Reported on 01/26/2022   Reginald Pal, DO Not Taking Active   clopidogrel (PLAVIX) 75 MG tablet [id number] Yes TAKE 1 TABLET BY MOUTH DAILY WITH BREAKFAST Bettina Gavia Hilton Cork, MD Taking Active   diclofenac Sodium (VOLTAREN) 1 % GEL [id number]  Apply 2 g topically 4 (four) times daily. Leandrew Koyanagi, MD  Active   Diclofenac Sodium 1.5 % SOLN [id number]  APPLY 2 DROPS ON TO THE AFFECTED AREA 2 TIMES DAILY Leandrew Koyanagi, MD  Active   ezetimibe (ZETIA) 10 MG tablet [id number] Yes TAKE 1 TABLET BY MOUTH ONCE DAILY Bettina Gavia Hilton Cork, MD Taking Active   fluticasone (FLOVENT HFA) 110 MCG/ACT inhaler [id number]  Inhale 2 puffs into the lungs 2 (two) times daily. Use with spacer. Rinse, gargle, and spit after use. Reginald Pal, DO  Active   glucose blood (FREESTYLE LITE) test strip [id number] No Use daily to check blood sugar.  Patient not taking: Reported on 01/26/2022   Reginald Pal, DO Not Taking Active   ketoconazole (NIZORAL) 2 % cream [id number]  Apply 1 Application topically daily. Reginald Pal, DO  Active   Lancets (FREESTYLE) lancets [id number]  Use daily to check blood sugar Reginald Pal, DO  Active   losartan (COZAAR) 25 MG tablet [id number] Yes Take 1 tablet (25 mg total) by mouth daily. Richardo Priest, MD Taking Active   metFORMIN (GLUCOPHAGE) 1000 MG tablet [id number] Yes TAKE 1 TABLET BY MOUTH 2 TIMES DAILY WITH A MEAL Wendling, Crosby Oyster, DO Taking Active   methylphenidate (RITALIN) 20 MG tablet [id number]  Take 1 tablet (20 mg total) by mouth 2 (two) times daily. Reginald Pal, DO  Active   methylphenidate (RITALIN) 20 MG tablet [id number]  Take 1 tablet (20 mg total) by mouth 2 (two) times daily. Reginald Pal, DO  Active   methylphenidate (RITALIN) 20 MG tablet [id number]  Take 1 tablet (20 mg total) by mouth 2 (two) times daily. Reginald Pal, DO  Active    montelukast (SINGULAIR) 10 MG tablet [id number]  TAKE 1 TABLET (10 MG TOTAL) BY MOUTH DAILY. Reginald Pal, DO  Active   nitroGLYCERIN (NITROSTAT) 0.4 MG SL tablet [id number] Yes Place 1 tablet (0.4 mg total) under the tongue every 5 (five) minutes as needed. Cheryln Manly, NP Taking Active            Med Note Linton Ham   Mon Jul 10, 2019  9:36 AM)    pantoprazole (PROTONIX) 40 MG tablet [id number] Yes Take 1 tablet (40 mg total) by mouth daily. Clemon Chambers, MD Taking Active   pioglitazone (ACTOS) 45 MG tablet [id number] Yes Take 1 tablet (45 mg total) by mouth daily.  Patient taking differently: Take 22.5 mg by mouth daily.   Reginald Pal, DO Taking Active   tadalafil (CIALIS) 20 MG  tablet [id number]  Take 0.5-1 tablets (10-20 mg total) by mouth every other day as needed for erectile dysfunction. Reginald Pal, DO  Active   testosterone (ANDROGEL) 50 MG/5GM (1%) GEL [id number] Yes APPLY 2 APPLICATIONS ON TO THE SKIN DAILY Reginald Pal, DO Taking Active   timolol (TIMOPTIC) 0.5 % ophthalmic solution [id number]  Instill 1 drop into the left eye twice daily.   Active             Patient Active Problem List   Diagnosis Date Noted   Hallux rigidus, right foot 09/15/2021   Spondylolisthesis at L5-S1 level 05/09/2021   Osteoarthritis    Low back pain    Heart murmur    Exercise-induced asthma    Diabetes mellitus without complication (HCC)    Arthritis    Anemia    Myofascial pain syndrome, cervical 07/28/2019   Lateral epicondylitis of right elbow 07/28/2019   Nontraumatic incomplete tear of right rotator cuff 07/25/2019   Tendinopathy of right biceps tendon 07/25/2019   Erectile dysfunction 07/11/2019   Mild persistent asthma/cough variant asthma 04/13/2019   Allergic rhinitis with a possible nonallergic component 04/13/2019   Acid reflux 04/13/2019   Chronic cough 04/13/2019   Hoarseness 04/13/2019   Cervical radicular  pain 01/10/2019   Ulnar neuropathy at elbow of right upper extremity 01/03/2019   Acute bilateral low back pain without sciatica 01/03/2019   Onychomycosis 05/25/2018   Palpitations 07/06/2017   CAD (coronary artery disease) 07/05/2017   Family history of early CAD 07/05/2017   Abnormal findings on diagnostic imaging of cardiovascular system 06/23/2017   Low vitamin D level 04/22/2017   Pain in right ankle and joints of right foot 10/05/2016   Hemarthrosis of right elbow 02/03/2016   Small thenar eminence 02/03/2016   Cervical disc disorder with radiculopathy of cervical region 10/07/2015   Incomplete rotator cuff tear 09/10/2015   Nonischemic cardiomyopathy ([place]) 05/15/2015   Fatigue 05/15/2015   Dyspnea on exertion 05/15/2015   Decreased cardiac ejection fraction 04/02/2015   Strain of latissimus dorsi muscle 01/21/2015   Shoulder bursitis 10/12/2014   Spinal stenosis, lumbar region, without neurogenic claudication 10/12/2014   BPPV (benign paroxysmal positional vertigo) 08/14/2014   Hyperlipidemia 01/19/2014   Anemia, unspecified 01/19/2014   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   Type 2 diabetes mellitus with hyperglycemia, without long-term current use of insulin ([place]) 12/22/2013   Hypogonadism in male 12/22/2013   Arthritis of hand, left 12/22/2013     Medication Assistance:  None required.  Patient affirms current coverage meets needs.   Assessment / Plan: Type 2 DM / obesity - improving but [facility] ending. He does continue to have post prandial hyperglycemia.  Plan to switch to G7 Continuous Glucose Monitor. Order place with [facility]. Patient is aware that he will need to be enrolled in new Diabetes Management Plan - he has appointment with nurse today at [place] with [place]. He has downloaded their app to help with managing diabetes.  Patient will continue to use G6 until he uses up current supply.  Patient received  the following instruction for DexCom 7 CMG:  Assisted with downloarding DexCom G7 app. Discussed difference between G6 and G7 sensors - especially the difference in application and that there sill only be the sensor now - no longer need for transmitter.   Discussed medications that might be better option than pioglitazone (can sometimes make it more difficult to lose weight) . Again patient's goal  if to try to discontinue pioglitazone in future.  If needed - could retry GLP 1 or Mounjaro or retry SGLT2 - GLP1 and SGLT2 have additional cardio benefits.  Pateint to consider fi blood glucose does not continue to improve or weight does not improved with weight loss / dietary changes.    Follow Up:  Telephone follow up appointment with care management team member scheduled for:  2 to 3 weeks   Cherre Robins, PharmD Clinical Pharmacist [place] [phone]

## 2022-01-27 ENCOUNTER — Other Ambulatory Visit: Payer: 59

## 2022-01-27 ENCOUNTER — Other Ambulatory Visit: Payer: Self-pay | Admitting: Family Medicine

## 2022-01-27 DIAGNOSIS — D649 Anemia, unspecified: Secondary | ICD-10-CM

## 2022-01-27 DIAGNOSIS — M542 Cervicalgia: Secondary | ICD-10-CM | POA: Diagnosis not present

## 2022-01-27 NOTE — Addendum Note (Signed)
Addended by: Kelle Darting A on: 01/27/2022 08:56 AM   Modules accepted: Orders

## 2022-01-27 NOTE — Addendum Note (Signed)
Addended by: Kelle Darting A on: 01/27/2022 08:39 AM   Modules accepted: Orders

## 2022-01-28 ENCOUNTER — Other Ambulatory Visit: Payer: Self-pay | Admitting: Family Medicine

## 2022-01-28 DIAGNOSIS — D649 Anemia, unspecified: Secondary | ICD-10-CM

## 2022-01-28 LAB — IRON,TIBC AND FERRITIN PANEL
%SAT: 27 % (calc) (ref 20–48)
Ferritin: 40 ng/mL (ref 24–380)
Iron: 102 ug/dL (ref 50–180)
TIBC: 380 mcg/dL (calc) (ref 250–425)

## 2022-01-28 LAB — B12 AND FOLATE PANEL
Folate: >24 ng/mL
Vitamin B-12: 271 pg/mL (ref 200–1100)

## 2022-01-29 ENCOUNTER — Other Ambulatory Visit (HOSPITAL_BASED_OUTPATIENT_CLINIC_OR_DEPARTMENT_OTHER): Payer: Self-pay

## 2022-01-29 ENCOUNTER — Other Ambulatory Visit: Payer: Self-pay | Admitting: Cardiology

## 2022-01-29 MED ORDER — LOSARTAN POTASSIUM 25 MG PO TABS
25.0000 mg | ORAL_TABLET | Freq: Every day | ORAL | 1 refills | Status: DC
  Filled 2022-01-29: qty 90, 90d supply, fill #0
  Filled 2022-05-11 – 2022-05-13 (×2): qty 90, 90d supply, fill #1

## 2022-02-06 NOTE — Addendum Note (Signed)
Addended by: Kelle Darting A on: 02/06/2022 03:42 PM   Modules accepted: Orders

## 2022-02-09 ENCOUNTER — Other Ambulatory Visit (HOSPITAL_BASED_OUTPATIENT_CLINIC_OR_DEPARTMENT_OTHER): Payer: Self-pay

## 2022-02-11 ENCOUNTER — Other Ambulatory Visit: Payer: Self-pay

## 2022-02-11 ENCOUNTER — Other Ambulatory Visit (INDEPENDENT_AMBULATORY_CARE_PROVIDER_SITE_OTHER): Payer: 59

## 2022-02-11 ENCOUNTER — Other Ambulatory Visit (HOSPITAL_COMMUNITY): Payer: Self-pay

## 2022-02-11 ENCOUNTER — Other Ambulatory Visit (HOSPITAL_BASED_OUTPATIENT_CLINIC_OR_DEPARTMENT_OTHER): Payer: Self-pay

## 2022-02-11 DIAGNOSIS — D649 Anemia, unspecified: Secondary | ICD-10-CM | POA: Diagnosis not present

## 2022-02-12 ENCOUNTER — Encounter: Payer: Self-pay | Admitting: Family Medicine

## 2022-02-12 LAB — CBC WITH DIFFERENTIAL/PLATELET
Absolute Monocytes: 529 cells/uL (ref 200–950)
Basophils Absolute: 32 cells/uL (ref 0–200)
Basophils Relative: 0.5 %
Eosinophils Absolute: 63 cells/uL (ref 15–500)
Eosinophils Relative: 1 %
HCT: 35.7 % — ABNORMAL LOW (ref 38.5–50.0)
Hemoglobin: 12.2 g/dL — ABNORMAL LOW (ref 13.2–17.1)
Lymphs Abs: 1915 cells/uL (ref 850–3900)
MCH: 33 pg (ref 27.0–33.0)
MCHC: 34.2 g/dL (ref 32.0–36.0)
MCV: 96.5 fL (ref 80.0–100.0)
MPV: 9.7 fL (ref 7.5–12.5)
Monocytes Relative: 8.4 %
Neutro Abs: 3761 cells/uL (ref 1500–7800)
Neutrophils Relative %: 59.7 %
Platelets: 243 10*3/uL (ref 140–400)
RBC: 3.7 10*6/uL — ABNORMAL LOW (ref 4.20–5.80)
RDW: 13.9 % (ref 11.0–15.0)
Total Lymphocyte: 30.4 %
WBC: 6.3 10*3/uL (ref 3.8–10.8)

## 2022-02-12 LAB — PATHOLOGIST SMEAR REVIEW

## 2022-02-16 ENCOUNTER — Ambulatory Visit: Payer: 59 | Admitting: Family Medicine

## 2022-02-16 ENCOUNTER — Encounter: Payer: Self-pay | Admitting: Family Medicine

## 2022-02-16 VITALS — BP 140/73 | HR 79 | Temp 98.5°F | Ht 70.0 in | Wt 195.2 lb

## 2022-02-16 DIAGNOSIS — D649 Anemia, unspecified: Secondary | ICD-10-CM | POA: Diagnosis not present

## 2022-02-16 DIAGNOSIS — R5383 Other fatigue: Secondary | ICD-10-CM | POA: Diagnosis not present

## 2022-02-16 LAB — VITAMIN D 25 HYDROXY (VIT D DEFICIENCY, FRACTURES): VITD: 44.59 ng/mL (ref 30.00–100.00)

## 2022-02-16 LAB — TSH: TSH: 2.55 u[IU]/mL (ref 0.35–5.50)

## 2022-02-16 NOTE — Patient Instructions (Addendum)
Give [place] 4-5 business days to get the results of your labs back.   If you do not hear anything about your referral in the next 1-2 weeks, call our office and ask for an update.  Stay hydrated.   Let us know if you need anything.

## 2022-02-16 NOTE — Progress Notes (Signed)
Chief Complaint  Patient presents with   Fatigue    Has increased     Subjective: Patient is a 66 y.o. male here for f/u fatigue.  Patient's fatigue has been getting worse.  He was playing pickle ball and felt very tired when he got home.  No shortness of breath or bleeding anywhere.  After seeing his result, he started taking iron but has not noticed an improvement.  His mood has been stable.  He does not snore at night nor does his wife ever say he stops breathing.  He could stand to be more hydrated.  Diet is otherwise fair he does not exercise routinely.  B12, folate, pathology review, and iron studies were unremarkable.  Past Medical History:  Diagnosis Date   ADD (attention deficit disorder)    Anemia    Arthritis    CAD (coronary artery disease)    2/19 PCI/DESx1 to Lcx   Diabetes mellitus without complication (HCC)    Exercise-induced asthma    Heart murmur    Hyperlipidemia    family hx of high cholesterol   Hypogonadism in male    Low back pain    Osteoarthritis     Objective: BP (!) 140/73 (BP Location: Left Arm, Patient Position: Sitting, Cuff Size: Normal)   Pulse 79   Temp 98.5 F (36.9 C) (Oral)   Ht '5\' 10"'$  (1.778 m)   Wt 195 lb 4 oz (88.6 kg)   SpO2 97%   BMI 28.02 kg/m  General: Awake, appears stated age Heart: RRR Skin: no jaundice Eyes: No scleral icterus Lungs: CTAB, no rales, wheezes or rhonchi. No accessory muscle use Psych: Age appropriate judgment and insight, normal affect and mood  Assessment and Plan: Normocytic anemia - Plan: Haptoglobin, Erythropoietin, Ambulatory referral to Hematology / Oncology, Lactate dehydrogenase, CANCELED: Lactate dehydrogenase  Fatigue, unspecified type - Plan: TSH, VITAMIN D 25 Hydroxy (Vit-D Deficiency, Fractures)  Check above labs.  Refer to hematology/oncology for reassurance and to make sure I am not missing anything.  Could be anemia of chronic disease.  Follow-up as originally scheduled. The patient  voiced understanding and agreement to the plan.  [place], DO 02/16/22  11:59 AM

## 2022-02-18 LAB — ERYTHROPOIETIN: Erythropoietin: 21.5 m[IU]/mL — ABNORMAL HIGH (ref 2.6–18.5)

## 2022-02-18 LAB — HAPTOGLOBIN: Haptoglobin: 310 mg/dL — ABNORMAL HIGH (ref 43–212)

## 2022-02-18 LAB — LACTATE DEHYDROGENASE: LDH: 132 U/L (ref 120–250)

## 2022-02-19 ENCOUNTER — Encounter: Payer: 59 | Admitting: Medical Oncology

## 2022-02-19 ENCOUNTER — Other Ambulatory Visit: Payer: 59

## 2022-02-24 ENCOUNTER — Other Ambulatory Visit: Payer: Self-pay | Admitting: Family Medicine

## 2022-02-25 ENCOUNTER — Other Ambulatory Visit (HOSPITAL_BASED_OUTPATIENT_CLINIC_OR_DEPARTMENT_OTHER): Payer: Self-pay

## 2022-02-25 MED ORDER — METHYLPHENIDATE HCL 20 MG PO TABS
20.0000 mg | ORAL_TABLET | Freq: Two times a day (BID) | ORAL | 0 refills | Status: DC
  Filled 2022-02-25 (×2): qty 60, 30d supply, fill #0

## 2022-02-25 MED ORDER — METHYLPHENIDATE HCL 20 MG PO TABS
20.0000 mg | ORAL_TABLET | Freq: Two times a day (BID) | ORAL | 0 refills | Status: DC
  Filled 2022-06-30: qty 60, 30d supply, fill #0

## 2022-02-25 MED ORDER — METHYLPHENIDATE HCL 20 MG PO TABS
20.0000 mg | ORAL_TABLET | Freq: Two times a day (BID) | ORAL | 0 refills | Status: DC
  Filled 2022-04-09 – 2022-04-29 (×3): qty 60, 30d supply, fill #0

## 2022-02-25 NOTE — Telephone Encounter (Signed)
Last RF--#60 wo ON on 12/25/21 Last OV---02/16/2022 CSC----04/16/20

## 2022-02-27 ENCOUNTER — Other Ambulatory Visit (HOSPITAL_BASED_OUTPATIENT_CLINIC_OR_DEPARTMENT_OTHER): Payer: Self-pay

## 2022-03-05 DIAGNOSIS — M9901 Segmental and somatic dysfunction of cervical region: Secondary | ICD-10-CM | POA: Diagnosis not present

## 2022-03-05 DIAGNOSIS — M9903 Segmental and somatic dysfunction of lumbar region: Secondary | ICD-10-CM | POA: Diagnosis not present

## 2022-03-05 DIAGNOSIS — M545 Low back pain, unspecified: Secondary | ICD-10-CM | POA: Diagnosis not present

## 2022-03-06 ENCOUNTER — Inpatient Hospital Stay: Payer: 59 | Attending: Hematology & Oncology

## 2022-03-06 ENCOUNTER — Encounter: Payer: Self-pay | Admitting: Hematology & Oncology

## 2022-03-06 ENCOUNTER — Inpatient Hospital Stay: Payer: 59 | Admitting: Hematology & Oncology

## 2022-03-06 VITALS — BP 120/70 | HR 81 | Temp 98.3°F | Resp 18 | Ht 69.0 in | Wt 192.2 lb

## 2022-03-06 DIAGNOSIS — Z79899 Other long term (current) drug therapy: Secondary | ICD-10-CM | POA: Diagnosis not present

## 2022-03-06 DIAGNOSIS — D649 Anemia, unspecified: Secondary | ICD-10-CM | POA: Insufficient documentation

## 2022-03-06 DIAGNOSIS — D519 Vitamin B12 deficiency anemia, unspecified: Secondary | ICD-10-CM

## 2022-03-06 DIAGNOSIS — E291 Testicular hypofunction: Secondary | ICD-10-CM

## 2022-03-06 DIAGNOSIS — Z801 Family history of malignant neoplasm of trachea, bronchus and lung: Secondary | ICD-10-CM | POA: Diagnosis not present

## 2022-03-06 DIAGNOSIS — E119 Type 2 diabetes mellitus without complications: Secondary | ICD-10-CM

## 2022-03-06 DIAGNOSIS — Z7984 Long term (current) use of oral hypoglycemic drugs: Secondary | ICD-10-CM | POA: Diagnosis not present

## 2022-03-06 LAB — CBC WITH DIFFERENTIAL (CANCER CENTER ONLY)
Abs Immature Granulocytes: 0.02 10*3/uL (ref 0.00–0.07)
Basophils Absolute: 0 10*3/uL (ref 0.0–0.1)
Basophils Relative: 1 %
Eosinophils Absolute: 0.1 10*3/uL (ref 0.0–0.5)
Eosinophils Relative: 1 %
HCT: 37.1 % — ABNORMAL LOW (ref 39.0–52.0)
Hemoglobin: 12.5 g/dL — ABNORMAL LOW (ref 13.0–17.0)
Immature Granulocytes: 0 %
Lymphocytes Relative: 28 %
Lymphs Abs: 1.5 10*3/uL (ref 0.7–4.0)
MCH: 33.1 pg (ref 26.0–34.0)
MCHC: 33.7 g/dL (ref 30.0–36.0)
MCV: 98.1 fL (ref 80.0–100.0)
Monocytes Absolute: 0.4 10*3/uL (ref 0.1–1.0)
Monocytes Relative: 8 %
Neutro Abs: 3.5 10*3/uL (ref 1.7–7.7)
Neutrophils Relative %: 62 %
Platelet Count: 227 10*3/uL (ref 150–400)
RBC: 3.78 MIL/uL — ABNORMAL LOW (ref 4.22–5.81)
RDW: 14.4 % (ref 11.5–15.5)
WBC Count: 5.5 10*3/uL (ref 4.0–10.5)
nRBC: 0 % (ref 0.0–0.2)

## 2022-03-06 LAB — CMP (CANCER CENTER ONLY)
ALT: 20 U/L (ref 0–44)
AST: 19 U/L (ref 15–41)
Albumin: 4.7 g/dL (ref 3.5–5.0)
Alkaline Phosphatase: 63 U/L (ref 38–126)
Anion gap: 9 (ref 5–15)
BUN: 20 mg/dL (ref 8–23)
CO2: 27 mmol/L (ref 22–32)
Calcium: 9.8 mg/dL (ref 8.9–10.3)
Chloride: 103 mmol/L (ref 98–111)
Creatinine: 0.92 mg/dL (ref 0.61–1.24)
GFR, Estimated: >60 mL/min (ref >60)
Glucose, Bld: 151 mg/dL — ABNORMAL HIGH (ref 70–99)
Potassium: 4.2 mmol/L (ref 3.5–5.1)
Sodium: 139 mmol/L (ref 135–145)
Total Bilirubin: 0.4 mg/dL (ref 0.3–1.2)
Total Protein: 7.7 g/dL (ref 6.5–8.1)

## 2022-03-06 LAB — SAVE SMEAR(SSMR), FOR PROVIDER SLIDE REVIEW

## 2022-03-06 LAB — RETICULOCYTES
Immature Retic Fract: 10.8 % (ref 2.3–15.9)
RBC.: 3.8 MIL/uL — ABNORMAL LOW (ref 4.22–5.81)
Retic Count, Absolute: 56.6 10*3/uL (ref 19.0–186.0)
Retic Ct Pct: 1.5 % (ref 0.4–3.1)

## 2022-03-06 LAB — VITAMIN B12: Vitamin B-12: 196 pg/mL (ref 180–914)

## 2022-03-06 LAB — FERRITIN: Ferritin: 25 ng/mL (ref 24–336)

## 2022-03-06 NOTE — Progress Notes (Signed)
Referral MD  Reason for Referral: Anemia-mild normochromic/normocytic  Chief Complaint  Patient presents with   New Patient (Initial Visit)  : I was told that I had anemia.  HPI: Reginald Rios is a very nice 66 year old Caucasian male.  He is half a tablet and half [place].  He has been quite active.  He is originally from [place].  He and his wife have moved down to [place].  There have been about 30 years.  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  He is retired right now.  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  He does have diabetes.  He is on quadrant medications.  He does have testosterone supplementation.  He does have a CGM try to help with his blood sugar monitoring.  He is on Ritalin.  Is been feeling more fatigued.  He sees Dr. Nani Ravens.  As always, Dr. Nani Ravens is very thorough.  Dr. Nani Ravens did some lab work.  On 01/23/2022, white cell count was 6.1.  Hemoglobin 12.2.  Platelet count 220,000.  MCV was 98.7.  On 02/11/2022, white cell count was 6.3.  Hemoglobin 12.2.  Platelet count 243,000.  MCV was 96.  His electrolytes have been relatively unremarkable.  He does not have renal insufficiency.  There is no problems with his liver tests.  Based on the mild anemia, Dr. Al Corpus, referred Reginald Rios to the [facility] for further evaluation.  The last colonoscopy was about 4 years ago.  He has had multiple surgeries.  These are multiple orthopedic surgeries.  He has had no problems with his appetite.  He is not a vegetarian.  There is been no significant weight gain or weight loss.  He has had no rashes.  He has had no obvious bleeding.  There is no melena or bright red blood per rectum.  He is has no swollen lymph nodes.  Currently, I would say his performance status is probably ECOG 0.    Past Medical History:  Diagnosis Date   ADD (attention deficit disorder)    Anemia    Arthritis    CAD (coronary artery disease)     2/19 PCI/DESx1 to Lcx   Diabetes mellitus without complication (HCC)    Exercise-induced asthma    Heart murmur    Hyperlipidemia    family hx of high cholesterol   Hypogonadism in male    Low back pain    Osteoarthritis   :   Past Surgical History:  Procedure Laterality Date   CORONARY STENT INTERVENTION N/A 06/23/2017   Procedure: CORONARY STENT INTERVENTION;  Surgeon: Leonie Man, MD;  Location: [facility];  Service: Cardiovascular;  Laterality: N/A;   INTRAVASCULAR PRESSURE WIRE/FFR STUDY N/A 06/23/2017   Procedure: INTRAVASCULAR PRESSURE WIRE/FFR STUDY;  Surgeon: Leonie Man, MD;  Location: [facility];  Service: Cardiovascular;  Laterality: N/A;   KNEE ARTHROSCOPY     LEFT HEART CATH AND CORONARY ANGIOGRAPHY N/A 06/23/2017   Procedure: LEFT HEART CATH AND CORONARY ANGIOGRAPHY;  Surgeon: Leonie Man, MD;  Location: [facility];  Service: Cardiovascular;  Laterality: N/A;   SHOULDER ARTHROSCOPY WITH BICEPSTENOTOMY Right 09/06/2019   Procedure: SHOULDER ARTHROSCOPY WITH BICEPSTENOTOMY;  Surgeon: Leandrew Koyanagi, MD;  Location: [facility];  Service: Orthopedics;  Laterality: Right;   SHOULDER ARTHROSCOPY WITH SUBACROMIAL DECOMPRESSION Right 09/06/2019   Procedure: RIGHT SHOULDER ARTHROSCOPY WITH EXTENSIVE DEBRIDEMENT, SUBACROMIAL DECOMPRESSION, BICEPS TENOTOMY;  Surgeon: Leandrew Koyanagi,  MD;  Location: [facility];  Service: Orthopedics;  Laterality: Right;   TENOTOMY ACHILLES TENDON    :   Current Outpatient Medications:    aspirin EC 81 MG tablet, Take 81 mg by mouth daily., Disp: , Rfl:    azelastine (ASTELIN) 0.1 % nasal spray, Place 2 sprays into both nostrils 2 (two) times daily as needed for rhinitis (nasal congestion)., Disp: 30 mL, Rfl: 3   Blood Glucose Monitoring Suppl (FREESTYLE FREEDOM LITE) w/Device KIT, Use to check blood sugar daily., Disp: 1 kit, Rfl: 0   Calcium Carbonate-Vit D-Min (QC  CALCIUM-MAGNESIUM-ZINC-D3 PO), Take by mouth at bedtime., Disp: , Rfl:    clopidogrel (PLAVIX) 75 MG tablet, TAKE 1 TABLET BY MOUTH DAILY WITH BREAKFAST, Disp: 90 tablet, Rfl: 1   ezetimibe (ZETIA) 10 MG tablet, TAKE 1 TABLET BY MOUTH ONCE DAILY, Disp: 90 tablet, Rfl: 1   glucose blood (FREESTYLE LITE) test strip, Use daily to check blood sugar., Disp: 100 each, Rfl: 3   ketoconazole (NIZORAL) 2 % cream, Apply 1 Application topically daily., Disp: 30 g, Rfl: 0   Lancets (FREESTYLE) lancets, Use daily to check blood sugar, Disp: 100 each, Rfl: 3   losartan (COZAAR) 25 MG tablet, Take 1 tablet (25 mg total) by mouth daily., Disp: 90 tablet, Rfl: 1   metFORMIN (GLUCOPHAGE) 1000 MG tablet, TAKE 1 TABLET BY MOUTH 2 TIMES DAILY WITH A MEAL, Disp: 180 tablet, Rfl: 3   [START ON 04/26/2022] methylphenidate (RITALIN) 20 MG tablet, Take 1 tablet (20 mg total) by mouth 2 (two) times daily., Disp: 60 tablet, Rfl: 0   montelukast (SINGULAIR) 10 MG tablet, TAKE 1 TABLET (10 MG TOTAL) BY MOUTH DAILY., Disp: 90 tablet, Rfl: 3   Multiple Vitamin (MULTIVITAMIN) tablet, Take 1 tablet by mouth daily., Disp: , Rfl:    pantoprazole (PROTONIX) 40 MG tablet, Take 1 tablet (40 mg total) by mouth daily., Disp: 30 tablet, Rfl: 3   pioglitazone (ACTOS) 45 MG tablet, Take 1 tablet (45 mg total) by mouth daily. (Patient taking differently: Take 22.5 mg by mouth daily.), Disp: 30 tablet, Rfl: 5   testosterone (ANDROGEL) 50 MG/5GM (1%) GEL, APPLY 2 APPLICATIONS ON TO THE SKIN DAILY, Disp: 300 g, Rfl: 5   timolol (TIMOPTIC) 0.5 % ophthalmic solution, Instill 1 drop into the left eye twice daily., Disp: 5 mL, Rfl: 0   albuterol (VENTOLIN HFA) 108 (90 Base) MCG/ACT inhaler, Inhale 2 puffs by mouth every 4-6 hours as needed for coughing or wheezing spells (Patient not taking: Reported on 03/06/2022), Disp: 18 g, Rfl: 3   baclofen (LIORESAL) 10 MG tablet, Take 1 tablet (10 mg total) by mouth 3 (three) times daily as needed for muscle  spasms. (Patient not taking: Reported on 03/06/2022), Disp: 30 each, Rfl: 1   diclofenac Sodium (VOLTAREN) 1 % GEL, Apply 2 g topically 4 (four) times daily. (Patient not taking: Reported on 03/06/2022), Disp: 100 g, Rfl: 0   Diclofenac Sodium 1.5 % SOLN, APPLY 2 DROPS ON TO THE AFFECTED AREA 2 TIMES DAILY (Patient not taking: Reported on 03/06/2022), Disp: 150 mL, Rfl: 3   fluticasone (FLOVENT HFA) 110 MCG/ACT inhaler, Inhale 2 puffs into the lungs 2 (two) times daily. Use with spacer. Rinse, gargle, and spit after use. (Patient not taking: Reported on 03/06/2022), Disp: 12 g, Rfl: 5   methylphenidate (RITALIN) 20 MG tablet, Take 1 tablet (20 mg total) by mouth 2 (two) times daily., Disp: 60 tablet, Rfl: 0   [START  ON 03/27/2022] methylphenidate (RITALIN) 20 MG tablet, Take 1 tablet (20 mg total) by mouth 2 (two) times daily., Disp: 60 tablet, Rfl: 0   nitroGLYCERIN (NITROSTAT) 0.4 MG SL tablet, Place 1 tablet (0.4 mg total) under the tongue every 5 (five) minutes as needed. (Patient not taking: Reported on 03/06/2022), Disp: 25 tablet, Rfl: 2   tadalafil (CIALIS) 20 MG tablet, Take 0.5-1 tablets (10-20 mg total) by mouth every other day as needed for erectile dysfunction. (Patient not taking: Reported on 03/06/2022), Disp: 10 tablet, Rfl: 5:  :   Allergies  Allergen Reactions   Invokana [Canagliflozin] Rash   Grass Pollen(K-O-R-T-Swt Vern)    Lisinopril Cough   Semaglutide Nausea Only  :   Family History  Problem Relation Age of Onset   Lung cancer Mother 33   Cancer Mother        lung cancer   Coronary artery disease Father 62   Heart disease Father    Hypothyroidism Brother    Food Allergy Son    Asthma Paternal Aunt    Colon cancer Neg Hx    Prostate cancer Neg Hx    Allergic rhinitis Neg Hx    Angioedema Neg Hx    Eczema Neg Hx    Immunodeficiency Neg Hx    Urticaria Neg Hx   :   Social History   Socioeconomic History   Marital status: Married    Spouse name: Not on file    Number of children: Not on file   Years of education: Not on file   Highest education level: Not on file  Occupational History   Occupation: Air cabin crew   Tobacco Use   Smoking status: Never   Smokeless tobacco: Never  Vaping Use   Vaping Use: Never used  Substance and Sexual Activity   Alcohol use: Yes    Alcohol/week: 2.0 standard drinks of alcohol    Types: 2 Glasses of wine per week    Comment: rare   Drug use: No   Sexual activity: Not on file  Other Topics Concern   Not on file  Social History Narrative   Previously worked as Geophysicist/field seismologist   Currently working at [employer] clinic   Married 60 years   2 children ages 23, 77   Wife is psychiatrist - Dr. Margurite Auerbach   Moved from [place] in [place] Determinants of Health   Financial Resource Strain: Not on file  Food Insecurity: Not on file  Transportation Needs: Not on file  Physical Activity: Not on file  Stress: Not on file  Social Connections: Not on file  Intimate Partner Violence: Not on file  :  Review of Systems  Constitutional:  Positive for malaise/fatigue.  HENT: Negative.    Eyes: Negative.   Respiratory: Negative.    Cardiovascular: Negative.   Gastrointestinal: Negative.   Genitourinary: Negative.   Musculoskeletal: Negative.   Skin: Negative.   Neurological: Negative.   Endo/Heme/Allergies: Negative.   Psychiatric/Behavioral: Negative.       Exam:   His vital signs show temperature of 98.3.  Pulse 81.  Blood pressure 120/70.  Weight is 192 pounds.  _0 @ Physical Exam Vitals reviewed.  HENT:     Head: Normocephalic and atraumatic.  Eyes:     Pupils: Pupils are equal, round, and reactive to light.  Cardiovascular:     Rate and Rhythm: Normal rate and regular rhythm.     Heart sounds: Normal heart sounds.  Pulmonary:  Effort: Pulmonary effort is normal.     Breath sounds: Normal breath sounds.  Abdominal:     General: Bowel sounds are normal.      Palpations: Abdomen is soft.  Musculoskeletal:        General: No tenderness or deformity. Normal range of motion.     Cervical back: Normal range of motion.  Lymphadenopathy:     Cervical: No cervical adenopathy.  Skin:    General: Skin is warm and dry.     Findings: No erythema or rash.  Neurological:     Mental Status: He is alert and oriented to person, place, and time.  Psychiatric:        Behavior: Behavior normal.        Thought Content: Thought content normal.        Judgment: Judgment normal.     Recent Labs    03/06/22 1331  WBC 5.5  HGB 12.5*  HCT 37.1*  PLT 227    Recent Labs    03/06/22 1331  NA 139  K 4.2  CL 103  CO2 27  GLUCOSE 151*  BUN 20  CREATININE 0.92  CALCIUM 9.8    Blood smear review: Normochromic and normocytic population of red blood cells.  There are no nucleated red blood cells.  I see no teardrop cells.  He has no schistocytes or spherocytes.  There are no target cells.  He has no rouleaux formation.  White blood cells appear normal in morphology and maturation.  There is no immature myeloid or lymphoid forms.  There are no hypersegmented polys.  Platelets are adequate in number and size.  Pathology: None    Assessment and Plan: Reginald Rios is a very nice 66 year old white male.  He has minimal anemia.  I cannot imagine that hemoglobin of 12.5 is causing all of his issues.  I just wonder what his testosterone level is.  Typically, for a guy his age, low testosterone could certainly be a problem.  I know he is on testosterone supplementation.  His blood smear is certainly not suggestive of any etiology.  Given that he has summertime blood in him, beta-thalassemia is always a consideration.  We will send off a hemoglobin electrophoresis.  I cannot imagine that he has iron deficiency.  I cannot imagine that he is vitamin B12 deficient.  He is on quite a few medications.  He does have diabetes.  I suppose these could also cause  problems.  I just do not see anything that we need to do for Reginald Rios right now.  Again we will see what his labs look like.  I just would be shocked if we found any abnormalities that would indicate that we would have to intervene.  He clearly does not need a bone marrow test.  As nice as he is, I just do not think we have to see him back.  It was a whole lot of fun talking to him about tennis.

## 2022-03-07 LAB — TESTOSTERONE: Testosterone: 460 ng/dL (ref 264–916)

## 2022-03-07 LAB — ERYTHROPOIETIN: Erythropoietin: 14.2 m[IU]/mL (ref 2.6–18.5)

## 2022-03-09 LAB — IRON AND IRON BINDING CAPACITY (CC-WL,HP ONLY)
Iron: 95 ug/dL (ref 45–182)
Saturation Ratios: 23 % (ref 17.9–39.5)
TIBC: 410 ug/dL (ref 250–450)
UIBC: 315 ug/dL (ref 117–376)

## 2022-03-10 ENCOUNTER — Other Ambulatory Visit (HOSPITAL_BASED_OUTPATIENT_CLINIC_OR_DEPARTMENT_OTHER): Payer: Self-pay

## 2022-03-10 ENCOUNTER — Other Ambulatory Visit: Payer: Self-pay | Admitting: Family Medicine

## 2022-03-10 LAB — HGB FRACTIONATION CASCADE
Hgb A2: 2.6 % (ref 1.8–3.2)
Hgb A: 97.4 % (ref 96.4–98.8)
Hgb F: 0 % (ref 0.0–2.0)
Hgb S: 0 %

## 2022-03-10 MED ORDER — PIOGLITAZONE HCL 45 MG PO TABS
45.0000 mg | ORAL_TABLET | Freq: Every day | ORAL | 5 refills | Status: DC
  Filled 2022-03-10: qty 30, 30d supply, fill #0
  Filled 2022-03-15 – 2022-04-09 (×2): qty 30, 30d supply, fill #1
  Filled 2022-05-29 – 2022-06-01 (×2): qty 30, 30d supply, fill #2
  Filled 2022-06-30: qty 30, 30d supply, fill #3
  Filled 2022-07-29: qty 30, 30d supply, fill #4
  Filled 2022-08-29: qty 30, 30d supply, fill #5

## 2022-03-11 LAB — PROTEIN ELECTROPHORESIS, SERUM, WITH REFLEX
A/G Ratio: 1.3 (ref 0.7–1.7)
Albumin ELP: 4 g/dL (ref 2.9–4.4)
Alpha-1-Globulin: 0.2 g/dL (ref 0.0–0.4)
Alpha-2-Globulin: 0.9 g/dL (ref 0.4–1.0)
Beta Globulin: 1.1 g/dL (ref 0.7–1.3)
Gamma Globulin: 0.8 g/dL (ref 0.4–1.8)
Globulin, Total: 3 g/dL (ref 2.2–3.9)
Total Protein ELP: 7 g/dL (ref 6.0–8.5)

## 2022-03-15 ENCOUNTER — Other Ambulatory Visit: Payer: Self-pay | Admitting: Internal Medicine

## 2022-03-15 DIAGNOSIS — J45990 Exercise induced bronchospasm: Secondary | ICD-10-CM

## 2022-03-16 ENCOUNTER — Other Ambulatory Visit (HOSPITAL_BASED_OUTPATIENT_CLINIC_OR_DEPARTMENT_OTHER): Payer: Self-pay

## 2022-03-16 MED ORDER — ALBUTEROL SULFATE HFA 108 (90 BASE) MCG/ACT IN AERS
2.0000 | INHALATION_SPRAY | RESPIRATORY_TRACT | 1 refills | Status: DC
  Filled 2022-03-16 – 2022-04-14 (×2): qty 6.7, 17d supply, fill #0
  Filled 2022-04-22: qty 6.7, 20d supply, fill #0
  Filled 2022-07-29: qty 6.7, 20d supply, fill #1

## 2022-03-23 DIAGNOSIS — M545 Low back pain, unspecified: Secondary | ICD-10-CM | POA: Diagnosis not present

## 2022-03-23 DIAGNOSIS — M9901 Segmental and somatic dysfunction of cervical region: Secondary | ICD-10-CM | POA: Diagnosis not present

## 2022-03-23 DIAGNOSIS — M9903 Segmental and somatic dysfunction of lumbar region: Secondary | ICD-10-CM | POA: Diagnosis not present

## 2022-03-30 ENCOUNTER — Other Ambulatory Visit: Payer: Self-pay

## 2022-04-01 ENCOUNTER — Other Ambulatory Visit (HOSPITAL_BASED_OUTPATIENT_CLINIC_OR_DEPARTMENT_OTHER): Payer: Self-pay

## 2022-04-01 ENCOUNTER — Other Ambulatory Visit: Payer: Self-pay | Admitting: Family Medicine

## 2022-04-01 ENCOUNTER — Ambulatory Visit: Payer: 59 | Attending: Cardiology | Admitting: Cardiology

## 2022-04-01 ENCOUNTER — Telehealth: Payer: Self-pay | Admitting: Family Medicine

## 2022-04-01 ENCOUNTER — Encounter: Payer: Self-pay | Admitting: Cardiology

## 2022-04-01 VITALS — BP 116/58 | HR 74 | Ht 69.0 in | Wt 194.0 lb

## 2022-04-01 DIAGNOSIS — E1165 Type 2 diabetes mellitus with hyperglycemia: Secondary | ICD-10-CM

## 2022-04-01 DIAGNOSIS — I2510 Atherosclerotic heart disease of native coronary artery without angina pectoris: Secondary | ICD-10-CM | POA: Diagnosis not present

## 2022-04-01 MED ORDER — DEXCOM G6 TRANSMITTER MISC
1 refills | Status: DC
  Filled 2022-04-01: qty 1, 30d supply, fill #0

## 2022-04-01 MED ORDER — NITROGLYCERIN 0.4 MG SL SUBL
0.4000 mg | SUBLINGUAL_TABLET | SUBLINGUAL | 2 refills | Status: DC | PRN
  Filled 2022-04-01: qty 25, 5d supply, fill #0

## 2022-04-01 NOTE — Progress Notes (Signed)
Cardiology Office Note:    Date:  04/01/2022   ID:  Reginald Rios, DOB 1955/12/17, MRN [id number]  PCP:  Reginald Pal, DO  Cardiologist:  Reginald Lindau, MD   Referring MD: Reginald Rios*    ASSESSMENT:    1. Coronary artery disease involving native coronary artery of native heart without angina pectoris   2. Type 2 diabetes mellitus with hyperglycemia, without long-term current use of insulin (HCC)    PLAN:    In order of problems listed above:  Coronary artery disease: Secondary prevention stressed with the patient.  Importance of compliance with diet medication stressed.  He spends at least half an hour and aerobic exercise on a regular basis at least 5 days a week. Mixed dyslipidemia: On lipid-lowering medications.  At morning goal LDL must be less than 60 and he promises to do better.  Diet emphasized.  He will be back in 3 months for follow-up liver lipid check. Patient will be seen in follow-up appointment in 9 months or earlier if the patient has any concerns    Medication Adjustments/Labs and Tests Ordered: Current medicines are reviewed at length with the patient today.  Concerns regarding medicines are outlined above.  No orders of the defined types were placed in this encounter.  No orders of the defined types were placed in this encounter.    No chief complaint on file.    History of Present Illness:    Reginald Rios is a 66 y.o. male.  Patient has past medical history of coronary artery disease, mixed dyslipidemia.  He denies any problems at this time and takes care of activities of daily living.  I have not seen him before.  He has been taken care of Dr. Bettina Rios who now has smoked his practice solely to [facility].  At the time of my evaluation, the patient is alert awake oriented and in no distress.  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   BPPV (benign paroxysmal positional vertigo) 08/14/2014   CAD (coronary artery disease)    2/19 PCI/DESx1 to Lcx   Cervical disc disorder with radiculopathy of cervical region 10/07/2015   Cervical radicular pain 01/10/2019   Chronic cough 04/13/2019   Decreased cardiac ejection fraction 04/02/2015   Diabetes mellitus without complication ([place])    Dyspnea on exertion 05/15/2015   Erectile dysfunction 07/11/2019   Exercise-induced asthma    Family history of early CAD 07/05/2017   Fatigue 05/15/2015   Hallux rigidus, right foot 09/15/2021   Heart murmur    Hemarthrosis of right elbow 02/03/2016   Hoarseness 04/13/2019   Hyperlipidemia    family hx of high cholesterol   Hypogonadism in male    Incomplete rotator cuff tear 09/10/2015   Injected 09/10/2015   Lateral epicondylitis of right elbow 07/28/2019   Low back pain    Low back pain at multiple sites 02/05/2021   Low vitamin D level 04/22/2017   Mild persistent asthma/cough variant asthma 04/13/2019   Myofascial pain syndrome, cervical 07/28/2019   Nonischemic cardiomyopathy ([place]) 05/15/2015   Nontraumatic incomplete tear of right rotator cuff  07/25/2019   Onychomycosis 05/25/2018   Osteoarthritis    Pain in right ankle and joints of right foot 10/05/2016   Palpitations 07/06/2017   Shoulder bursitis 10/12/2014   Injected in 10/12/2014  Repeat 01/21/2015   Small thenar eminence 02/03/2016   Spinal stenosis, lumbar region, without neurogenic claudication 10/12/2014   Spondylolisthesis at L5-S1 level 05/09/2021   Strain of latissimus dorsi muscle 01/21/2015   Tendinopathy of right biceps tendon 07/25/2019   Type 2 diabetes  mellitus with hyperglycemia, without long-term current use of insulin ([place]) 12/22/2013   Ulnar neuropathy at elbow of right upper extremity 01/03/2019    Past Surgical History:  Procedure Laterality Date   CORONARY STENT INTERVENTION N/A 06/23/2017   Procedure: CORONARY STENT INTERVENTION;  Surgeon: Reginald Man, MD;  Location: [place] [facility];  Service: Cardiovascular;  Laterality: N/A;   INTRAVASCULAR PRESSURE WIRE/FFR STUDY N/A 06/23/2017   Procedure: INTRAVASCULAR PRESSURE WIRE/FFR STUDY;  Surgeon: Reginald Man, MD;  Location: [place] [facility];  Service: Cardiovascular;  Laterality: N/A;   KNEE ARTHROSCOPY     LEFT HEART CATH AND CORONARY ANGIOGRAPHY N/A 06/23/2017   Procedure: LEFT HEART CATH AND CORONARY ANGIOGRAPHY;  Surgeon: Reginald Man, MD;  Location: [place] [facility];  Service: Cardiovascular;  Laterality: N/A;   SHOULDER ARTHROSCOPY WITH BICEPSTENOTOMY Right 09/06/2019   Procedure: SHOULDER ARTHROSCOPY WITH BICEPSTENOTOMY;  Surgeon: Reginald Koyanagi, MD;  Location: [place];  Service: Orthopedics;  Laterality: Right;   SHOULDER ARTHROSCOPY WITH SUBACROMIAL DECOMPRESSION Right 09/06/2019   Procedure: RIGHT SHOULDER ARTHROSCOPY WITH EXTENSIVE DEBRIDEMENT, SUBACROMIAL DECOMPRESSION, BICEPS TENOTOMY;  Surgeon: Reginald Koyanagi, MD;  Location: [place];  Service: Orthopedics;  Laterality: Right;   TENOTOMY ACHILLES TENDON      Current Medications: Current Meds  Medication Sig   albuterol (VENTOLIN HFA) 108 (90 Base) MCG/ACT inhaler Inhale 2 puffs by mouth every 4-6 hours as needed for coughing or wheezing spells   aspirin EC 81 MG tablet Take 81 mg by mouth daily.   azelastine (ASTELIN) 0.1 % nasal spray Place 2 sprays into both nostrils 2 (two) times daily as needed for rhinitis (nasal congestion).   baclofen (LIORESAL) 10 MG tablet Take 1 tablet (10 mg total) by mouth 3 (three) times daily as needed for muscle spasms.   Blood Glucose  Monitoring Suppl (FREESTYLE FREEDOM LITE) w/Device KIT Use to check blood sugar daily.   Calcium Carbonate-Vit D-Min (QC CALCIUM-MAGNESIUM-ZINC-D3 PO) Take 1 tablet by mouth at bedtime.   clopidogrel (PLAVIX) 75 MG tablet TAKE 1 TABLET BY MOUTH DAILY WITH BREAKFAST   diclofenac Sodium (VOLTAREN) 1 % GEL Apply 2 g topically 4 (four) times daily.   Diclofenac Sodium 1.5 % SOLN APPLY 2 DROPS ON TO THE AFFECTED AREA 2 TIMES DAILY   ezetimibe (ZETIA) 10 MG tablet TAKE 1 TABLET BY MOUTH ONCE DAILY   fluticasone (FLOVENT HFA) 110 MCG/ACT inhaler Inhale 2 puffs into the lungs 2 (two) times daily. Use with spacer. Rinse, gargle, and spit after use.   glucose blood (FREESTYLE LITE) test strip Use daily to check blood sugar.   Lancets (FREESTYLE) lancets Use daily to check blood sugar   losartan (COZAAR) 25 MG tablet Take 1 tablet (25 mg total) by mouth daily.   metFORMIN (GLUCOPHAGE) 1000 MG tablet TAKE 1 TABLET BY MOUTH 2 TIMES DAILY WITH A MEAL   methylphenidate (RITALIN) 20 MG tablet Take 1 tablet (20 mg total) by mouth 2 (two) times daily.  methylphenidate (RITALIN) 20 MG tablet Take 1 tablet (20 mg total) by mouth 2 (two) times daily.   [START ON 04/26/2022] methylphenidate (RITALIN) 20 MG tablet Take 1 tablet (20 mg total) by mouth 2 (two) times daily.   montelukast (SINGULAIR) 10 MG tablet TAKE 1 TABLET (10 MG TOTAL) BY MOUTH DAILY.   Multiple Vitamin (MULTIVITAMIN) tablet Take 1 tablet by mouth daily.   nitroGLYCERIN (NITROSTAT) 0.4 MG SL tablet Place 1 tablet (0.4 mg total) under the tongue every 5 (five) minutes as needed.   pantoprazole (PROTONIX) 40 MG tablet Take 1 tablet (40 mg total) by mouth daily.   pioglitazone (ACTOS) 45 MG tablet Take 1 tablet (45 mg total) by mouth daily.   tadalafil (CIALIS) 20 MG tablet Take 0.5-1 tablets (10-20 mg total) by mouth every other day as needed for erectile dysfunction.   testosterone (ANDROGEL) 50 MG/5GM (1%) GEL APPLY 2 APPLICATIONS ON TO THE SKIN  DAILY     Allergies:   Invokana [canagliflozin], Grass pollen(k-o-r-t-swt vern), Lisinopril, and Semaglutide   Social History   Socioeconomic History   Marital status: Married    Spouse name: Not on file   Number of children: Not on file   Years of education: Not on file   Highest education level: Not on file  Occupational History   Occupation: Air cabin crew   Tobacco Use   Smoking status: Never   Smokeless tobacco: Never  Vaping Use   Vaping Use: Never used  Substance and Sexual Activity   Alcohol use: Yes    Alcohol/week: 2.0 standard drinks of alcohol    Types: 2 Glasses of wine per week    Comment: rare   Drug use: No   Sexual activity: Not on file  Other Topics Concern   Not on file  Social History Narrative   Previously worked as Geophysicist/field seismologist   Currently working at [employer] clinic   Married 80 years   2 children ages 101, 56   Wife is psychiatrist - Dr. Margurite Auerbach   Moved from [place] in [employer] [place]   Social Determinants of Health   Financial Resource Strain: Not on file  Food Insecurity: Not on file  Transportation Needs: Not on file  Physical Activity: Not on file  Stress: Not on file  Social Connections: Not on file     Family History: The patient's family history includes Asthma in his paternal aunt; Cancer in his mother; Coronary artery disease (age of onset: 13) in his father; Food Allergy in his son; Heart disease in his father; Hypothyroidism in his brother; Lung cancer (age of onset: 59) in his mother. There is no history of Colon cancer, Prostate cancer, Allergic rhinitis, Angioedema, Eczema, Immunodeficiency, or Urticaria.  ROS:   Please see the history of present illness.    All other systems reviewed and are negative.  EKGs/Labs/Other Studies Reviewed:    The following studies were reviewed today: EKG revealed sinus rhythm and nonspecific ST-T changes   Recent Labs: 06/20/2021: Magnesium 2.0 02/16/2022: TSH 2.55 03/06/2022:  ALT 20; BUN 20; Creatinine 0.92; Hemoglobin 12.5; Platelet Count 227; Potassium 4.2; Sodium 139  Recent Lipid Panel    Component Value Date/Time   CHOL 150 01/23/2022 1030   CHOL 150 04/21/2017 1019   TRIG 70.0 01/23/2022 1030   HDL 52.50 01/23/2022 1030   HDL 53 04/21/2017 1019   CHOLHDL 3 01/23/2022 1030   VLDL 14.0 01/23/2022 1030   LDLCALC 83 01/23/2022 1030   LDLCALC 74  04/21/2017 1019    Physical Exam:    VS:  BP (!) 116/58   Pulse 74   Ht _0  (1.753 m)   Wt 194 lb (88 kg)   SpO2 95%   BMI 28.65 kg/m     Wt Readings from Last 3 Encounters:  04/01/22 194 lb (88 kg)  03/06/22 192 lb 4 oz (87.2 kg)  02/16/22 195 lb 4 oz (88.6 kg)     GEN: Patient is in no acute distress HEENT: Normal NECK: No JVD; No carotid bruits LYMPHATICS: No lymphadenopathy CARDIAC: Hear sounds regular, 2/6 systolic murmur at the apex. RESPIRATORY:  Clear to auscultation without rales, wheezing or rhonchi  ABDOMEN: Soft, non-tender, non-distended MUSCULOSKELETAL:  No edema; No deformity  SKIN: Warm and dry NEUROLOGIC:  Alert and oriented x 3 PSYCHIATRIC:  Normal affect   Signed, Reginald Lindau, MD  04/01/2022 9:10 AM    [place]

## 2022-04-01 NOTE — Telephone Encounter (Signed)
sent 

## 2022-04-01 NOTE — Telephone Encounter (Signed)
Pt came in office stating is needing a rx to order ao G6 Transmitor - machine to monitor blood sugar. Pt mentioned that provider is aware of pt needing this.  If any question pt stated can call pt at [phone]. Please advise.

## 2022-04-01 NOTE — Patient Instructions (Signed)
Medication Instructions:  Your physician has recommended you make the following change in your medication:    Stop aspirin  Use nitroglycerin 1 tablet placed under the tongue at the first sign of chest pain or an angina attack. 1 tablet may be used every 5 minutes as needed, for up to 15 minutes. Do not take more than 3 tablets in 15 minutes. If pain persist call 911 or go to the nearest ED.   *If you need a refill on your cardiac medications before your next appointment, please call your pharmacy*   Lab Work: Your physician recommends that you return for lab work in: 3 months You need to have labs done when you are fasting.  You do not need to make an appointment as the order has already been placed. The labs you are going to have done are BMET, LFT and Lipids.   [address] 2nd floor M-W 8-11:30 and 1-4:30 and Thursday and Friday 8-11:30.  If you have labs (blood work) drawn today and your tests are completely normal, you will receive your results only by: [place] (if you have MyChart) OR A paper copy in the mail If you have any lab test that is abnormal or we need to change your treatment, we will call you to review the results.   Testing/Procedures: None ordered   Follow-Up: At [facility], you and your health needs are our priority.  As part of our continuing mission to provide you with exceptional heart care, we have created designated Provider Care Teams.  These Care Teams include your primary Cardiologist (physician) and Advanced Practice Providers (APPs -  Physician Assistants and Nurse Practitioners) who all work together to provide you with the care you need, when you need it.  We recommend signing up for the patient portal called "MyChart".  Sign up information is provided on this After Visit Summary.  MyChart is used to connect with patients for Virtual Visits (Telemedicine).  Patients are able to view lab/test results, encounter notes, upcoming  appointments, etc.  Non-urgent messages can be sent to your provider as well.   To learn more about what you can do with MyChart, go to [email or web address].    Your next appointment:   9 month(s)  The format for your next appointment:   In Person  Provider:   Jyl Heinz, MD   Other Instructions NA

## 2022-04-02 ENCOUNTER — Other Ambulatory Visit (HOSPITAL_BASED_OUTPATIENT_CLINIC_OR_DEPARTMENT_OTHER): Payer: Self-pay

## 2022-04-03 ENCOUNTER — Other Ambulatory Visit (HOSPITAL_BASED_OUTPATIENT_CLINIC_OR_DEPARTMENT_OTHER): Payer: Self-pay

## 2022-04-06 ENCOUNTER — Other Ambulatory Visit (HOSPITAL_BASED_OUTPATIENT_CLINIC_OR_DEPARTMENT_OTHER): Payer: Self-pay

## 2022-04-07 ENCOUNTER — Telehealth: Payer: Self-pay

## 2022-04-07 ENCOUNTER — Other Ambulatory Visit (HOSPITAL_BASED_OUTPATIENT_CLINIC_OR_DEPARTMENT_OTHER): Payer: Self-pay

## 2022-04-07 NOTE — Telephone Encounter (Signed)
PA initiated via Covermymeds; KEY: [id number]. Awaiting determination.

## 2022-04-09 ENCOUNTER — Other Ambulatory Visit (HOSPITAL_BASED_OUTPATIENT_CLINIC_OR_DEPARTMENT_OTHER): Payer: Self-pay

## 2022-04-09 ENCOUNTER — Other Ambulatory Visit: Payer: Self-pay | Admitting: Internal Medicine

## 2022-04-09 DIAGNOSIS — M542 Cervicalgia: Secondary | ICD-10-CM | POA: Diagnosis not present

## 2022-04-09 DIAGNOSIS — M9903 Segmental and somatic dysfunction of lumbar region: Secondary | ICD-10-CM | POA: Diagnosis not present

## 2022-04-09 DIAGNOSIS — M9901 Segmental and somatic dysfunction of cervical region: Secondary | ICD-10-CM | POA: Diagnosis not present

## 2022-04-09 DIAGNOSIS — M9902 Segmental and somatic dysfunction of thoracic region: Secondary | ICD-10-CM | POA: Diagnosis not present

## 2022-04-09 DIAGNOSIS — M545 Low back pain, unspecified: Secondary | ICD-10-CM | POA: Diagnosis not present

## 2022-04-09 DIAGNOSIS — K219 Gastro-esophageal reflux disease without esophagitis: Secondary | ICD-10-CM

## 2022-04-09 MED ORDER — PANTOPRAZOLE SODIUM 40 MG PO TBEC
40.0000 mg | DELAYED_RELEASE_TABLET | Freq: Every day | ORAL | 3 refills | Status: DC
  Filled 2022-04-09: qty 30, 30d supply, fill #0
  Filled 2022-05-11: qty 30, 30d supply, fill #1
  Filled 2022-05-13: qty 30, 30d supply, fill #0
  Filled 2022-06-30: qty 30, 30d supply, fill #1
  Filled 2022-07-29: qty 30, 30d supply, fill #2

## 2022-04-10 NOTE — Telephone Encounter (Signed)
Called informed the patient of denial. He will look into something online.

## 2022-04-10 NOTE — Telephone Encounter (Signed)
PA denied.   This request has not been approved. Based on the information submitted for review, you did not meet our guideline rules for the requested drug. In order for your request to be approved, your provider would need to show that you have met the guideline rules below. The details below are written in medical language. If you have questions, please contact your provider. In some cases, the requested medication or alternatives offered may have additional approval requirements. Your provider requested Dexcom G6 transmitter to manage your condition of type 2 diabetes mellitus (a chronic condition in which your body is resistant to insulin, a hormone that regulates your blood sugar). For the treatment of type 2 diabetes mellitus, our guideline named CGM STEP OVERRIDE (reviewed for Dexcom G6 transmitter) requires that you meet ONE of the following rules:1) You are being treated with insulin.2) You have a clinical need that cannot be managed with self-monitoring of blood glucose (such as frequent hypoglycemia [low blood sugar], hypoglycemic unawareness [a condition in which you do not experience the usual early warning symptoms of hypoglycemia, such as feeling shaky, sweating, chills, or confusion], or you are unable to achieve control of diabetes).3) You are currently stable on the requested agent This request was denied because we did not receive information that you meet one of the requirements listed above. Please work with your doctor to use a different product or get [place] more information if it will allow [place] to approve this request. A written notification letter will follow with additional details.

## 2022-04-13 ENCOUNTER — Other Ambulatory Visit (HOSPITAL_BASED_OUTPATIENT_CLINIC_OR_DEPARTMENT_OTHER): Payer: Self-pay

## 2022-04-14 ENCOUNTER — Other Ambulatory Visit (HOSPITAL_BASED_OUTPATIENT_CLINIC_OR_DEPARTMENT_OTHER): Payer: Self-pay

## 2022-04-16 ENCOUNTER — Other Ambulatory Visit (HOSPITAL_BASED_OUTPATIENT_CLINIC_OR_DEPARTMENT_OTHER): Payer: Self-pay

## 2022-04-17 ENCOUNTER — Other Ambulatory Visit (HOSPITAL_BASED_OUTPATIENT_CLINIC_OR_DEPARTMENT_OTHER): Payer: Self-pay

## 2022-04-20 ENCOUNTER — Other Ambulatory Visit (HOSPITAL_BASED_OUTPATIENT_CLINIC_OR_DEPARTMENT_OTHER): Payer: Self-pay

## 2022-04-21 ENCOUNTER — Other Ambulatory Visit (HOSPITAL_BASED_OUTPATIENT_CLINIC_OR_DEPARTMENT_OTHER): Payer: Self-pay

## 2022-04-22 ENCOUNTER — Other Ambulatory Visit (HOSPITAL_BASED_OUTPATIENT_CLINIC_OR_DEPARTMENT_OTHER): Payer: Self-pay

## 2022-04-22 DIAGNOSIS — I2510 Atherosclerotic heart disease of native coronary artery without angina pectoris: Secondary | ICD-10-CM | POA: Diagnosis not present

## 2022-04-23 LAB — LIPID PANEL
Chol/HDL Ratio: 2.9 ratio (ref 0.0–5.0)
Cholesterol, Total: 156 mg/dL (ref 100–199)
HDL: 53 mg/dL (ref >39)
LDL Chol Calc (NIH): 89 mg/dL (ref 0–99)
Triglycerides: 69 mg/dL (ref 0–149)
VLDL Cholesterol Cal: 14 mg/dL (ref 5–40)

## 2022-04-23 LAB — HEPATIC FUNCTION PANEL
ALT: 29 IU/L (ref 0–44)
AST: 22 IU/L (ref 0–40)
Albumin: 4.6 g/dL (ref 3.9–4.9)
Alkaline Phosphatase: 81 IU/L (ref 44–121)
Bilirubin Total: 0.3 mg/dL (ref 0.0–1.2)
Bilirubin, Direct: 0.1 mg/dL (ref 0.00–0.40)
Total Protein: 6.8 g/dL (ref 6.0–8.5)

## 2022-04-23 LAB — BASIC METABOLIC PANEL
BUN/Creatinine Ratio: 15 (ref 10–24)
BUN: 12 mg/dL (ref 8–27)
CO2: 22 mmol/L (ref 20–29)
Calcium: 9.6 mg/dL (ref 8.6–10.2)
Chloride: 103 mmol/L (ref 96–106)
Creatinine, Ser: 0.78 mg/dL (ref 0.76–1.27)
Glucose: 152 mg/dL — ABNORMAL HIGH (ref 70–99)
Potassium: 4 mmol/L (ref 3.5–5.2)
Sodium: 139 mmol/L (ref 134–144)
eGFR: 97 mL/min/{1.73_m2} (ref >59)

## 2022-04-28 ENCOUNTER — Other Ambulatory Visit (HOSPITAL_BASED_OUTPATIENT_CLINIC_OR_DEPARTMENT_OTHER): Payer: Self-pay

## 2022-04-29 ENCOUNTER — Other Ambulatory Visit: Payer: Self-pay

## 2022-04-29 ENCOUNTER — Other Ambulatory Visit (HOSPITAL_BASED_OUTPATIENT_CLINIC_OR_DEPARTMENT_OTHER): Payer: Self-pay

## 2022-04-30 ENCOUNTER — Ambulatory Visit (INDEPENDENT_AMBULATORY_CARE_PROVIDER_SITE_OTHER): Payer: 59 | Admitting: Podiatry

## 2022-04-30 ENCOUNTER — Encounter: Payer: Self-pay | Admitting: Podiatry

## 2022-04-30 ENCOUNTER — Ambulatory Visit (INDEPENDENT_AMBULATORY_CARE_PROVIDER_SITE_OTHER): Payer: 59

## 2022-04-30 ENCOUNTER — Other Ambulatory Visit (HOSPITAL_BASED_OUTPATIENT_CLINIC_OR_DEPARTMENT_OTHER): Payer: Self-pay

## 2022-04-30 DIAGNOSIS — I2510 Atherosclerotic heart disease of native coronary artery without angina pectoris: Secondary | ICD-10-CM

## 2022-04-30 DIAGNOSIS — M2042 Other hammer toe(s) (acquired), left foot: Secondary | ICD-10-CM

## 2022-04-30 DIAGNOSIS — M2041 Other hammer toe(s) (acquired), right foot: Secondary | ICD-10-CM

## 2022-04-30 DIAGNOSIS — M7751 Other enthesopathy of right foot: Secondary | ICD-10-CM

## 2022-04-30 MED ORDER — TRIAMCINOLONE ACETONIDE 10 MG/ML IJ SUSP
10.0000 mg | Freq: Once | INTRAMUSCULAR | Status: AC
  Administered 2022-04-30: 10 mg

## 2022-05-01 ENCOUNTER — Other Ambulatory Visit (HOSPITAL_BASED_OUTPATIENT_CLINIC_OR_DEPARTMENT_OTHER): Payer: Self-pay

## 2022-05-01 NOTE — Progress Notes (Signed)
Subjective:   Patient ID: Reginald Rios, male   DOB: 66 y.o.   MRN: [id number]   HPI Patient presents that he has developed a lot of pain around his big toe joint of his right foot and also he is concerned about his digital deformity second toe of both feet stating he thinks he may want to get those corrected   ROS      Objective:  Physical Exam  Vascular status found to be intact neurologically I did note moderate diminishment sharp dull vibratory with no muscle strength loss when tested today.  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  For the inner phalangeal joint I did go ahead I did sterile prep I injected the joint 3 mg Dexasone Kenalog 5 mg Xylocaine and will see back if it remains symptomatic.  Discussed hammertoe repair explained digital straightening with pin fixation and reviewed this with patient and at this point next year this may be something that we will pursue  X-rays indicate significant rigid contracture digit to bilateral moderate osteoporosis with possibility for arthritis in her phalangeal joint right big toe

## 2022-05-05 ENCOUNTER — Other Ambulatory Visit (HOSPITAL_BASED_OUTPATIENT_CLINIC_OR_DEPARTMENT_OTHER): Payer: Self-pay

## 2022-05-05 ENCOUNTER — Telehealth (INDEPENDENT_AMBULATORY_CARE_PROVIDER_SITE_OTHER): Payer: Commercial Managed Care - PPO | Admitting: Family Medicine

## 2022-05-05 ENCOUNTER — Telehealth: Payer: Self-pay

## 2022-05-05 ENCOUNTER — Encounter: Payer: Self-pay | Admitting: Family Medicine

## 2022-05-05 VITALS — Temp 99.1°F | Ht 70.0 in | Wt 194.0 lb

## 2022-05-05 DIAGNOSIS — U071 COVID-19: Secondary | ICD-10-CM

## 2022-05-05 MED ORDER — MOLNUPIRAVIR EUA 200MG CAPSULE: 4.0000 | ORAL_CAPSULE | Freq: Two times a day (BID) | ORAL | 0 refills | Status: DC

## 2022-05-05 MED ORDER — BENZONATATE 100 MG PO CAPS: ORAL_CAPSULE | ORAL | 0 refills | Status: DC

## 2022-05-05 NOTE — Telephone Encounter (Signed)
Pt had a VV today with Dr Maudie Mercury, he has checked downstairs and at his [facility] and they are out of [place].   He says she prescribed this one because Plaxovid would interfere with his blood thinner. Please advise.

## 2022-05-05 NOTE — Telephone Encounter (Signed)
With current covid severity, would be nice to get him on that, but wouldn't want to stop Plavix, which with Paxlovid we would have to do. He could call a pharmacy to find out where it is stocked and we can send it there in the meanwhile. Ty.

## 2022-05-05 NOTE — Telephone Encounter (Signed)
He will call back in the morning if he finds a pharmacy that has it in [place].

## 2022-05-05 NOTE — Patient Instructions (Signed)
HOME CARE TIPS:    -I sent the medication(s) we discussed to your pharmacy: Meds ordered this encounter  Medications   molnupiravir EUA (LAGEVRIO) 200 mg CAPS capsule    Sig: Take 4 capsules (800 mg total) by mouth 2 (two) times daily for 5 days.    Dispense:  40 capsule    Refill:  0   benzonatate (TESSALON PERLES) 100 MG capsule    Sig: 1-2 capsules up to twice daily as needed for cough.    Dispense:  30 capsule    Refill:  0     -I sent in the [place] treatment or referral you requested per our discussion. Please see the information provided below and discuss further with the pharmacist/treatment team.    -there is a chance of rebound illness with covid after improving. This can happen whether or not you take an antiviral treatment. If you become sick again with covid after getting better, please schedule a follow up virtual visit and isolate again.  -can use tylenol if needed for fevers, aches and pains per instructions  -nasal saline sinus rinses twice daily  -stay hydrated, drink plenty of fluids and eat small healthy meals - avoid dairy  -follow up with your doctor in 2-3 days unless improving and feeling better  -stay home while sick, except to seek medical care. If you have COVID19, you will likely be contagious for 7-10 days. Flu or Influenza is likely contagious for about 7 days. Other respiratory viral infections remain contagious for 5-10+ days depending on the virus and many other factors. Wear a good mask that fits snugly (such as N95 or KN95) if around others to reduce the risk of transmission.  It was nice to meet you today, and I really hope you are feeling better soon. I help [facility] out with telemedicine visits on Tuesdays and Thursdays and am happy to help if you need a follow up virtual visit on those days. Otherwise, if you have any concerns or questions following this visit please schedule a follow up visit with your Primary Care doctor or seek care at a  local urgent care clinic to avoid delays in care.    Seek in person care or schedule a follow up video visit promptly if your symptoms worsen, new concerns arise or you are not improving with treatment. Call 911 and/or seek emergency care if your symptoms are severe or life threatening.  PLEASE SEE THE FOLLOWING LINK FOR THE MOST UPDATED INFORMATION ABOUT LAGEVRIO:  [email or web address]      Fact Sheet for Patients And Caregivers Emergency Use Authorization (EUA) Of LAGEVRIOT (molnupiravir) capsules For Coronavirus Disease 2019 (COVID-19)  What is the most important information I should know about LAGEVRIO? LAGEVRIO may cause serious side effects, including: ? LAGEVRIO may cause harm to your unborn baby. It is not known if LAGEVRIO will harm your baby if you take LAGEVRIO during pregnancy. o LAGEVRIO is not recommended for use in pregnancy. o LAGEVRIO has not been studied in pregnancy. LAGEVRIO was studied in pregnant animals only. When LAGEVRIO was given to pregnant animals, LAGEVRIO caused harm to their unborn babies. o You and your healthcare provider may decide that you should take LAGEVRIO during pregnancy if there are no other COVID-19 treatment options approved or authorized by the FDA that are accessible or clinically appropriate for you. o If you and your healthcare provider decide that you should take LAGEVRIO during pregnancy, you and your healthcare provider should discuss the known and potential benefits  and the potential risks of taking LAGEVRIO during pregnancy. For individuals who are able to become pregnant: ? You should use a reliable method of birth control (contraception) consistently and correctly during treatment with LAGEVRIO and for 4 days after the last dose of LAGEVRIO. Talk to your healthcare provider about reliable birth control methods. ? Before starting treatment with [facility] your healthcare provider may do a pregnancy test to see if you are  pregnant before starting treatment with LAGEVRIO. ? Tell your healthcare provider right away if you become pregnant or think you may be pregnant during treatment with LAGEVRIO. Pregnancy Surveillance Program: ? There is a pregnancy surveillance program for individuals who take LAGEVRIO during pregnancy. The purpose of this program is to collect information about the health of you and your baby. Talk to your healthcare provider about how to take part in this program. ? If you take LAGEVRIO during pregnancy and you agree to participate in the pregnancy surveillance program and allow your healthcare provider to share your information with [place], then your healthcare provider will report your use of [place] during pregnancy to [place]. by calling [phone] or [email or web address]. For individuals who are sexually active with partners who are able to become pregnant: ? It is not known if LAGEVRIO can affect sperm. While the risk is regarded as low, animal studies to fully assess the potential for LAGEVRIO to affect the babies of males treated with LAGEVRIO have not been completed. A reliable method of birth control (contraception) should be used consistently and correctly during treatment with LAGEVRIO and for at least 3 months after the last dose. The risk to sperm beyond 3 months is not known. Studies to understand the risk to sperm beyond 3 months are ongoing. Talk to your healthcare provider about reliable birth control methods. Talk to your healthcare provider if you have questions or concerns about how LAGEVRIO may affect sperm. You are being given this fact sheet because your healthcare provider believes it is necessary to provide you with LAGEVRIO for the treatment of adults with mild-to-moderate coronavirus disease 2019 (COVID-19) with positive results of direct SARS-CoV-2 viral testing, and who are at high risk for progression to severe COVID-19  including hospitalization or death, and for whom other COVID-19 treatment options approved or authorized by the FDA are not accessible or clinically appropriate. The U.S. Food and Drug Administration (FDA) has issued an Emergency Use Authorization (EUA) to make LAGEVRIO available during the COVID-19 pandemic (for more details about an EUA please see "What is an Emergency Use Authorization?" at the end of this document). LAGEVRIO is not an FDA-approved medicine in the [place]. Read this Fact Sheet for information about LAGEVRIO. Talk to your healthcare provider about your options if you have any questions. It is your choice to take LAGEVRIO.  What is COVID-19? COVID-19 is caused by a virus called a coronavirus. You can get COVID-19 through close contact with another person who has the virus. COVID-19 illnesses have ranged from very mild-to-severe, including illness resulting in death. While information so far suggests that most COVID-19 illness is mild, serious illness can happen and may cause some of your other medical conditions to become worse. Older people and people of all ages with severe, long lasting (chronic) medical conditions like heart disease, lung disease and diabetes, for example seem to be at higher risk of being hospitalized for COVID-19.  What is LAGEVRIO? LAGEVRIO is an investigational medicine used to treat mild-to-moderate COVID-19  in adults: ? with positive results of direct SARS-CoV-2 viral testing, and ? who are at high risk for progression to severe COVID-19 including hospitalization or death, and for whom other COVID-19 treatment options approved or authorized by the FDA are not accessible or clinically appropriate. The FDA has authorized the emergency use of LAGEVRIO for the treatment of mild-tomoderate COVID-19 in adults under an EUA. For more information on EUA, see the "What is an Emergency Use Authorization (EUA)?" section at the end of this Fact  Sheet. LAGEVRIO is not authorized: ? for use in people less than 37 years of age. ? for prevention of COVID-19. ? for people needing hospitalization for COVID-19. ? for use for longer than 5 consecutive days.  What should I tell my healthcare provider before I take LAGEVRIO? Tell your healthcare provider if you: ? Have any allergies ? Are breastfeeding or plan to breastfeed ? Have any serious illnesses ? Are taking any medicines (prescription, over-the-counter, vitamins, or herbal products).  How do I take LAGEVRIO? ? Take LAGEVRIO exactly as your healthcare provider tells you to take it. ? Take 4 capsules of LAGEVRIO every 12 hours (for example, at 8 am and at 8 pm) ? Take LAGEVRIO for 5 days. It is important that you complete the full 5 days of treatment with LAGEVRIO. Do not stop taking LAGEVRIO before you complete the full 5 days of treatment, even if you feel better. ? Take LAGEVRIO with or without food. ? You should stay in isolation for as long as your healthcare provider tells you to. Talk to your healthcare provider if you are not sure about how to properly isolate while you have COVID-19. ? Swallow LAGEVRIO capsules whole. Do not open, break, or crush the capsules. If you cannot swallow capsules whole, tell your healthcare provider. ? What to do if you miss a dose: o If it has been less than 10 hours since the missed dose, take it as soon as you remember o If it has been more than 10 hours since the missed dose, skip the missed dose and take your dose at the next scheduled time. ? Do not double the dose of LAGEVRIO to make up for a missed dose.  What are the important possible side effects of LAGEVRIO? ? See, "What is the most important information I should know about LAGEVRIO?" ? Allergic Reactions. Allergic reactions can happen in people taking LAGEVRIO, even after only 1 dose. Stop taking LAGEVRIO and call your healthcare provider right away if you get any of the  following symptoms of an allergic reaction: o hives o rapid heartbeat o trouble swallowing or breathing o swelling of the mouth, lips, or face o throat tightness o hoarseness o skin rash The most common side effects of LAGEVRIO are: ? diarrhea ? nausea ? dizziness These are not all the possible side effects of LAGEVRIO. Not many people have taken LAGEVRIO. Serious and unexpected side effects may happen. This medicine is still being studied, so it is possible that all of the risks are not known at this time.  What other treatment choices are there?  Veklury (remdesivir) is FDA-approved as an intravenous (IV) infusion for the treatment of mildto-moderate [id number]-38 in certain adults and children. Talk with your doctor to see if Marijean Heath is appropriate for you. Like LAGEVRIO, FDA may also allow for the emergency use of other medicines to treat people with COVID-19. Go to [email or web address].[email or web address] for more information. It is your choice to be treated or not  to be treated with LAGEVRIO. Should you decide not to take it, it will not change your standard medical care.  What if I am breastfeeding? Breastfeeding is not recommended during treatment with LAGEVRIO and for 4 days after the last dose of LAGEVRIO. If you are breastfeeding or plan to breastfeed, talk to your healthcare provider about your options and specific situation before taking LAGEVRIO.  How do I report side effects with LAGEVRIO? Contact your healthcare provider if you have any side effects that bother you or do not go away. Report side effects to FDA MedWatch at SmoothHits.hu or call 1-800-FDA-1088 (1- 978-067-3439).  How should I store [place]? ? Store LAGEVRIO capsules at room temperature between 46F to 37F (20C to 25C). ? Keep LAGEVRIO and all medicines out of the reach of children and pets. How can I learn more  about COVID-19? ? Ask your healthcare provider. ? Visit SeekRooms.co.uk ? Contact your local or state public health department. ? Call [place] at [phone] (toll free in the [place] ? Visit [email or web address]  What Is an Emergency Use Authorization (EUA)? The [place] FDA has made [place] available under an emergency access mechanism called an Emergency Use Authorization (EUA) The EUA is supported by a Presenter, broadcasting Health and Human Service (HHS) declaration that circumstances exist to justify emergency use of drugs and biological products during the COVID-19 pandemic. LAGEVRIO for the treatment of mild-to-moderate COVID-19 in adults with positive results of direct SARS-CoV-2 viral testing, who are at high risk for progression to severe COVID-19, including hospitalization or death, and for whom alternative COVID-19 treatment options approved or authorized by FDA are not accessible or clinically appropriate, has not undergone the same type of review as an FDA-approved product. In issuing an EUA under the ZLDJT-70 public health emergency, the FDA has determined, among other things, that based on the total amount of scientific evidence available including data from adequate and well-controlled clinical trials, if available, it is reasonable to believe that the product may be effective for diagnosing, treating, or preventing COVID-19, or a serious or life-threatening disease or condition caused by COVID-19; that the known and potential benefits of the product, when used to diagnose, treat, or prevent such disease or condition, outweigh the known and potential risks of such product; and that there are no adequate, approved, and available alternatives.  All of these criteria must be met to allow for the product to be used in the treatment of patients during the COVID-19 pandemic. The EUA for LAGEVRIO is in effect for the duration of the COVID-19 declaration justifying  emergency use of LAGEVRIO, unless terminated or revoked (after which LAGEVRIO may no longer be used under the EUA). For patent information: http://rogers.info/ Copyright  2021-2022 [employer] [place], [place] and its affiliates. All rights reserved. [id number] Revised: March 2022

## 2022-05-05 NOTE — Telephone Encounter (Signed)
Called informed of PCP instructions. He agreed to do so.

## 2022-05-05 NOTE — Progress Notes (Signed)
Virtual Visit via Telephone Note  I connected with Reginald Rios on 05/05/22 at 10:40 AM EST by telephone and verified that I am speaking with the correct person using two identifiers.   I discussed the limitations of performing an evaluation and management service by telephone and requested permission for a phone visit. The patient expressed understanding and agreed to proceed.  Location patient:  [place] Location provider: work or home office Participants present for the call: patient, provider Patient did not have a visit with me in the prior 7 days to address this/these issue(s).   History of Present Illness:  Acute telemedicine visit for Covid: -Onset:2-3 days ago, was feeling more flu like yesterday and did covid test which was positive -Symptoms include: nasal congestion, body aches, feels tire, cough, low grade temp -Denies:CP, SOB, NVD, inability to eat/drink -Has tried: musinex -Pertinent past medical history: never had covid before, see below - has diabetes, asthma, cad -GFR was 97 -Pertinent medication allergies:  Allergies  Allergen Reactions   Invokana [Canagliflozin] Rash   Grass Pollen(K-O-R-T-Swt Vern)    Lisinopril Cough   Semaglutide Nausea Only  -COVID-19 vaccine status: had the latest booster in October Immunization History  Administered Date(s) Administered   Fluad Quad(high Dose 65+) 01/05/2019, 01/19/2020, 02/17/2021, 01/23/2022   Influenza Inj Mdck Quad Pf 02/20/2018   Influenza,inj,Quad PF,6+ Mos 12/22/2013   PFIZER Comirnaty(Gray Top)Covid-19 Tri-Sucrose Vaccine 08/19/2020   PFIZER(Purple Top)SARS-COV-2 Vaccination 06/09/2019, 07/04/2019, 01/29/2020   PNEUMOCOCCAL CONJUGATE-20 04/21/2021   Pfizer Covid-19 Vaccine Bivalent Booster 3yr & up 01/23/2021   Pfizer Covid-19 Vaccine Bivalent Booster 5y-11y 01/22/2022   Pneumococcal Polysaccharide-23 05/25/2018   Tdap 05/25/2018      Past Medical History:  Diagnosis Date   Abnormal findings on  diagnostic imaging of cardiovascular system 06/23/2017   Acid reflux 04/13/2019   Acute bilateral low back pain without sciatica 01/03/2019   Allergic rhinitis with a possible nonallergic component 04/13/2019   Anemia    Anemia, unspecified 01/19/2014   Arthritis    Arthritis of hand, left 12/22/2013   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   BPPV (benign paroxysmal positional vertigo) 08/14/2014   CAD (coronary artery disease)    2/19 PCI/DESx1 to Lcx   Cervical disc disorder with radiculopathy of cervical region 10/07/2015   Cervical radicular pain 01/10/2019   Chronic cough 04/13/2019   Decreased cardiac ejection fraction 04/02/2015   Diabetes mellitus without complication ([place]    Dyspnea on exertion 05/15/2015   Erectile dysfunction 07/11/2019   Exercise-induced asthma    Family history of early CAD 07/05/2017   Fatigue 05/15/2015   Hallux rigidus, right foot 09/15/2021   Heart murmur    Hemarthrosis of right elbow 02/03/2016   Hoarseness 04/13/2019   Hyperlipidemia    family hx of high cholesterol   Hypogonadism in male    Incomplete rotator cuff tear 09/10/2015   Injected 09/10/2015   Lateral epicondylitis of right elbow 07/28/2019   Low back pain    Low back pain at multiple sites 02/05/2021   Low vitamin D level 04/22/2017   Mild persistent asthma/cough variant asthma 04/13/2019   Myofascial pain syndrome, cervical 07/28/2019   Nonischemic cardiomyopathy ([place] 05/15/2015   Nontraumatic incomplete tear of right rotator cuff 07/25/2019   Onychomycosis 05/25/2018   Osteoarthritis    Pain in right ankle and joints of right foot 10/05/2016   Palpitations 07/06/2017   Shoulder bursitis 10/12/2014   Injected in 10/12/2014  Repeat 01/21/2015   Small thenar eminence  02/03/2016   Spinal stenosis, lumbar region, without neurogenic claudication 10/12/2014   Spondylolisthesis at L5-S1 level 05/09/2021   Strain of latissimus dorsi muscle  01/21/2015   Tendinopathy of right biceps tendon 07/25/2019   Type 2 diabetes mellitus with hyperglycemia, without long-term current use of insulin ([place]) 12/22/2013   Ulnar neuropathy at elbow of right upper extremity 01/03/2019    Current Outpatient Medications on File Prior to Visit  Medication Sig Dispense Refill   albuterol (VENTOLIN HFA) 108 (90 Base) MCG/ACT inhaler Inhale 2 puffs by mouth every 4-6 hours as needed for coughing or wheezing spells 6.7 g 1   azelastine (ASTELIN) 0.1 % nasal spray Place 2 sprays into both nostrils 2 (two) times daily as needed for rhinitis (nasal congestion). 30 mL 3   baclofen (LIORESAL) 10 MG tablet Take 1 tablet (10 mg total) by mouth 3 (three) times daily as needed for muscle spasms. 30 each 1   Blood Glucose Monitoring Suppl (FREESTYLE FREEDOM LITE) w/Device KIT Use to check blood sugar daily. 1 kit 0   Calcium Carbonate-Vit D-Min (QC CALCIUM-MAGNESIUM-ZINC-D3 PO) Take 1 tablet by mouth at bedtime.     clopidogrel (PLAVIX) 75 MG tablet TAKE 1 TABLET BY MOUTH DAILY WITH BREAKFAST 90 tablet 1   diclofenac Sodium (VOLTAREN) 1 % GEL Apply 2 g topically 4 (four) times daily. 100 g 0   ezetimibe (ZETIA) 10 MG tablet TAKE 1 TABLET BY MOUTH ONCE DAILY 90 tablet 1   fluticasone (FLOVENT HFA) 110 MCG/ACT inhaler Inhale 2 puffs into the lungs 2 (two) times daily. Use with spacer. Rinse, gargle, and spit after use. 12 g 5   glucose blood (FREESTYLE LITE) test strip Use daily to check blood sugar. 100 each 3   Lancets (FREESTYLE) lancets Use daily to check blood sugar 100 each 3   losartan (COZAAR) 25 MG tablet Take 1 tablet (25 mg total) by mouth daily. 90 tablet 1   metFORMIN (GLUCOPHAGE) 1000 MG tablet TAKE 1 TABLET BY MOUTH 2 TIMES DAILY WITH A MEAL 180 tablet 3   methylphenidate (RITALIN) 20 MG tablet Take 1 tablet (20 mg total) by mouth 2 (two) times daily. 60 tablet 0   methylphenidate (RITALIN) 20 MG tablet Take 1 tablet (20 mg total) by mouth 2 (two) times  daily. 60 tablet 0   methylphenidate (RITALIN) 20 MG tablet Take 1 tablet (20 mg total) by mouth 2 (two) times daily. 60 tablet 0   montelukast (SINGULAIR) 10 MG tablet TAKE 1 TABLET (10 MG TOTAL) BY MOUTH DAILY. 90 tablet 3   Multiple Vitamin (MULTIVITAMIN) tablet Take 1 tablet by mouth daily.     nitroGLYCERIN (NITROSTAT) 0.4 MG SL tablet Place 1 tablet (0.4 mg total) under the tongue every 5 (five) minutes as needed. 25 tablet 2   pantoprazole (PROTONIX) 40 MG tablet Take 1 tablet (40 mg total) by mouth daily. 30 tablet 3   pioglitazone (ACTOS) 45 MG tablet Take 1 tablet (45 mg total) by mouth daily. 30 tablet 5   tadalafil (CIALIS) 20 MG tablet Take 0.5-1 tablets (10-20 mg total) by mouth every other day as needed for erectile dysfunction. 10 tablet 5   testosterone (ANDROGEL) 50 MG/5GM (1%) GEL APPLY 2 APPLICATIONS ON TO THE SKIN DAILY 300 g 5   No current facility-administered medications on file prior to visit.    Observations/Objective: Patient sounds cheerful and well on the phone. I do not appreciate any SOB. Speech and thought processing are grossly intact. Patient reported vitals:  Assessment and Plan:  COVID-19   Discussed treatment options, side effect and risk of drug interactions, ideal treatment window, potential complications, isolation and precautions for COVID-19.  Discussed possibility of rebound with or without antivirals. Checked for/reviewed last GFR - listed in HPI if available.  After lengthy discussion, the patient opted for treatment with Legevrio due to being higher risk for complications of covid or severe disease and other factors risks/interactions/side effects per EUA document vs possible benefits and precautions. This information was shared with patient/ provided in patient instructions. Also, advised that patient discuss risks/interactions and use with pharmacist/treatment team as well. The patient did want a prescription for cough, Tessalon Rx sent.  Other  symptomatic care measures summarized in patient instructions. Advised to seek prompt virtual visit or in person care if worsening, new symptoms arise, or if is not improving with treatment as expected per our conversation of expected course. Discussed options for follow up care. Did let this patient know that I do telemedicine on Tuesdays and Thursdays for [place] and those are the days I am logged into the system. Advised to schedule follow up visit with PCP, [place] virtual visits or [facility] if any further questions or concerns to avoid delays in care.   I discussed the assessment and treatment plan with the patient. The patient was provided an opportunity to ask questions and all were answered. The patient agreed with the plan and demonstrated an understanding of the instructions.    Follow Up Instructions:  I did not refer this patient for an OV with me in the next 24 hours for this/these issue(s).  I discussed the assessment and treatment plan with the patient. The patient was provided an opportunity to ask questions and all were answered. The patient agreed with the plan and demonstrated an understanding of the instructions.   I spent 15 minutes on the date of this visit in the care of this patient. See summary of tasks completed to properly care for this patient in the detailed notes above which also included counseling of above, review of PMH, medications, allergies, evaluation of the patient and ordering and/or  instructing patient on testing and care options.     Lucretia Kern, DO

## 2022-05-06 ENCOUNTER — Other Ambulatory Visit: Payer: Self-pay | Admitting: Family Medicine

## 2022-05-06 ENCOUNTER — Other Ambulatory Visit (HOSPITAL_BASED_OUTPATIENT_CLINIC_OR_DEPARTMENT_OTHER): Payer: Self-pay

## 2022-05-06 MED ORDER — MOLNUPIRAVIR EUA 200MG CAPSULE
4.0000 | ORAL_CAPSULE | Freq: Two times a day (BID) | ORAL | 0 refills | Status: AC
  Filled 2022-05-06: qty 40, 5d supply, fill #0

## 2022-05-06 NOTE — Telephone Encounter (Signed)
Patient informed. 

## 2022-05-06 NOTE — Telephone Encounter (Signed)
Patient called back to advise that [facility] located in [place] on [address]. Please send prescription to this location.

## 2022-05-07 ENCOUNTER — Other Ambulatory Visit (HOSPITAL_BASED_OUTPATIENT_CLINIC_OR_DEPARTMENT_OTHER): Payer: Self-pay

## 2022-05-07 ENCOUNTER — Other Ambulatory Visit: Payer: Self-pay

## 2022-05-07 NOTE — Telephone Encounter (Signed)
Pt stated he is on day 4 of covid , has been taking tylenol 500 mg every 6 hours and when the tylenol wears off he has difficulty swallowing.. pt wants to know do you want him to come in right away or when his 5 days are up

## 2022-05-07 NOTE — Telephone Encounter (Signed)
Patient would like something else to be sent for his sore throat, he states it is getting worse. Please advise.

## 2022-05-08 ENCOUNTER — Other Ambulatory Visit (HOSPITAL_BASED_OUTPATIENT_CLINIC_OR_DEPARTMENT_OTHER): Payer: Self-pay

## 2022-05-08 ENCOUNTER — Other Ambulatory Visit: Payer: Self-pay

## 2022-05-08 NOTE — Telephone Encounter (Signed)
He already doing 1000 mg every 6 hours Will continue and he is doing any better He would like to know if PCP thinks sore throat could go to [facility]

## 2022-05-08 NOTE — Telephone Encounter (Signed)
Called the patient and he wants to just wait it out for now, if worsens he will call and schedule appt.

## 2022-05-11 ENCOUNTER — Other Ambulatory Visit (HOSPITAL_BASED_OUTPATIENT_CLINIC_OR_DEPARTMENT_OTHER): Payer: Self-pay

## 2022-05-12 ENCOUNTER — Other Ambulatory Visit (HOSPITAL_BASED_OUTPATIENT_CLINIC_OR_DEPARTMENT_OTHER): Payer: Self-pay

## 2022-05-12 ENCOUNTER — Other Ambulatory Visit: Payer: Self-pay

## 2022-05-13 ENCOUNTER — Other Ambulatory Visit: Payer: Self-pay | Admitting: Cardiology

## 2022-05-13 ENCOUNTER — Other Ambulatory Visit (HOSPITAL_BASED_OUTPATIENT_CLINIC_OR_DEPARTMENT_OTHER): Payer: Self-pay

## 2022-05-13 ENCOUNTER — Other Ambulatory Visit (HOSPITAL_COMMUNITY): Payer: Self-pay

## 2022-05-13 MED ORDER — CLOPIDOGREL BISULFATE 75 MG PO TABS
75.0000 mg | ORAL_TABLET | Freq: Every day | ORAL | 2 refills | Status: AC
  Filled 2022-05-13 – 2022-06-30 (×3): qty 90, 90d supply, fill #0
  Filled 2022-10-11: qty 90, 90d supply, fill #1
  Filled 2023-02-03: qty 90, 90d supply, fill #2

## 2022-05-29 ENCOUNTER — Other Ambulatory Visit: Payer: Self-pay | Admitting: Family Medicine

## 2022-05-29 DIAGNOSIS — E1165 Type 2 diabetes mellitus with hyperglycemia: Secondary | ICD-10-CM

## 2022-06-01 ENCOUNTER — Other Ambulatory Visit (HOSPITAL_BASED_OUTPATIENT_CLINIC_OR_DEPARTMENT_OTHER): Payer: Self-pay

## 2022-06-01 ENCOUNTER — Other Ambulatory Visit: Payer: Self-pay

## 2022-06-01 MED ORDER — METFORMIN HCL 1000 MG PO TABS
1000.0000 mg | ORAL_TABLET | Freq: Two times a day (BID) | ORAL | 3 refills | Status: DC
  Filled 2022-06-01: qty 180, 90d supply, fill #0
  Filled 2022-08-29: qty 180, 90d supply, fill #1
  Filled 2022-12-12: qty 180, 90d supply, fill #2
  Filled 2023-03-23: qty 180, 90d supply, fill #3

## 2022-06-17 DIAGNOSIS — H53143 Visual discomfort, bilateral: Secondary | ICD-10-CM | POA: Diagnosis not present

## 2022-06-17 DIAGNOSIS — H35371 Puckering of macula, right eye: Secondary | ICD-10-CM | POA: Diagnosis not present

## 2022-06-17 DIAGNOSIS — E119 Type 2 diabetes mellitus without complications: Secondary | ICD-10-CM | POA: Diagnosis not present

## 2022-06-17 DIAGNOSIS — H35363 Drusen (degenerative) of macula, bilateral: Secondary | ICD-10-CM | POA: Diagnosis not present

## 2022-06-17 DIAGNOSIS — Z961 Presence of intraocular lens: Secondary | ICD-10-CM | POA: Diagnosis not present

## 2022-06-30 ENCOUNTER — Other Ambulatory Visit (HOSPITAL_BASED_OUTPATIENT_CLINIC_OR_DEPARTMENT_OTHER): Payer: Self-pay

## 2022-07-20 ENCOUNTER — Encounter: Payer: Self-pay | Admitting: Family Medicine

## 2022-07-20 ENCOUNTER — Ambulatory Visit (INDEPENDENT_AMBULATORY_CARE_PROVIDER_SITE_OTHER): Payer: Commercial Managed Care - PPO

## 2022-07-20 ENCOUNTER — Ambulatory Visit: Payer: Commercial Managed Care - PPO | Admitting: Family Medicine

## 2022-07-20 ENCOUNTER — Other Ambulatory Visit: Payer: Self-pay

## 2022-07-20 VITALS — BP 136/72 | HR 81 | Ht 70.0 in | Wt 204.6 lb

## 2022-07-20 DIAGNOSIS — G8929 Other chronic pain: Secondary | ICD-10-CM

## 2022-07-20 DIAGNOSIS — M1712 Unilateral primary osteoarthritis, left knee: Secondary | ICD-10-CM | POA: Diagnosis not present

## 2022-07-20 DIAGNOSIS — M170 Bilateral primary osteoarthritis of knee: Secondary | ICD-10-CM | POA: Diagnosis not present

## 2022-07-20 DIAGNOSIS — M25531 Pain in right wrist: Secondary | ICD-10-CM

## 2022-07-20 DIAGNOSIS — M1711 Unilateral primary osteoarthritis, right knee: Secondary | ICD-10-CM | POA: Diagnosis not present

## 2022-07-20 NOTE — Progress Notes (Signed)
Shirlyn Goltz, PhD, LAT, ATC acting as a scribe for Lynne Leader, MD.  Reginald Rios is a 67 y.o. male who presents to [facility] [place] [facility] at [facility] today for right wrist pain.  Patient was previously seen by Dr. Georgina Snell on 10/16/2021 for right foot pain.  Today, patient reports right wrist pain x 1 day, injured while woodworking. Was turning a clamp and heard/felt a pop. Pt locates pain to dorsal aspect of the wrist. Pain radiating into the arm up to the elbow. Notes, limited ROM, swelling, weakness., numbness in the hand and fingers.  He has a relevant history for persistent cervical radiculopathy causing weakness in the right hand.  This is a chronic issue and not new.  He notes chronic right hand muscle atrophy.  He is going to pickleball camp in 2 weeks!  Grip strength: yes Paresthesia: no Radiates: up to the elbow Aggravates: constant, worse with movement Treatments tried: ice  Additionally he notes bilateral knee pain.  His left knee is more bothersome than his right knee.  He notes pain and swelling.   Pertinent review of systems: No fevers or chills.  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  This is an old issue.   Exam:  BP 136/72   Pulse 81   Ht 5\' 10"  (1.778 m)   Wt 204 lb 9.6 oz (92.8 kg)   SpO2 97%   BMI 29.36 kg/m  General: Well Developed, well nourished, and in no acute distress.   MSK: Right hand and wrist: Some swelling present in the dorsal right wrist. Atrophy of the thenar and hypothenar muscle groups. Normal hand motion. Wrist motion slightly limited extension and flexion. Grip strength is intact. Some pain present with resisted wrist extension. Pulses cap refill and sensation are intact distally.  Left knee: Mild effusion normal motion with crepitation.  Tender palpation medial joint line.  Lab and Radiology Results  Diagnostic Limited MSK Ultrasound of: Right  wrist Mild hypoechoic swelling dorsal right wrist ulnar carpal joint. Hypoechoic fluid collecting around ECU tendon consistent with ECU tenosynovitis. Impression: Wrist effusion and ECU tenosynovitis.  Procedure: Real-time Ultrasound Guided Injection of left knee superior lateral patellar space Device: Philips Affiniti 50G Images permanently stored and available for review in PACS Verbal informed consent obtained.  Discussed risks and benefits of procedure. Warned about infection, bleeding, hyperglycemia damage to structures among others. Patient expresses understanding and agreement Time-out conducted.   Noted no overlying erythema, induration, or other signs of local infection.   Skin prepped in a sterile fashion.   Local anesthesia: Topical Ethyl chloride.   With sterile technique and under real time ultrasound guidance: 40 mg Kenalog and 2 mL Marcaine injected into knee joint. Fluid seen entering the joint capsule.   Completed without difficulty   Pain immediately resolved suggesting accurate placement of the medication.   Advised to call if fevers/chills, erythema, induration, drainage, or persistent bleeding.   Images permanently stored and available for review in the ultrasound unit.  Impression: Technically successful ultrasound guided injection.       X-ray images right wrist and bilateral knees obtained today personally and independently interpreted  Right wrist: DJD Scaphoid joint. No acute fracture.   Right knee: Mild DJD medial compartment.  Mild patellofemoral DJD.  No acute fractures are visible.  Left knee: Moderate DJD medial knee and patellofemoral joint. No acute fracture.   Await formal radiology review  Assessment and Plan: 67 y.o. male  with right wrist pain thought to be due to irritation of ECU tendon with possible snap over ulnar styloid.  Plan for immobilization with wrist brace and referral to OT.  Recommend also Voltaren gel.  He has a pickleball camp  coming up in 2 weeks we will try to get a better for that.  Additionally he has an exacerbation of chronic knee pain bilaterally left worse than right thought to be due to DJD.  Plan for steroid injection left knee. Plan for repeat x-rays bilateral knees today.  Recheck 1 month.  PDMP not reviewed this encounter. Orders Placed This Encounter  Procedures   [place] LIMITED JOINT SPACE STRUCTURES UP RIGHT(NO LINKED CHARGES)    Order Specific Question:   Reason for Exam (SYMPTOM  OR DIAGNOSIS REQUIRED)    Answer:   right wrist injury    Order Specific Question:   Preferred imaging location?    Answer:   [place]   DG Wrist Complete Right    Standing Status:   Future    Number of Occurrences:   1    Standing Expiration Date:   08/20/2022    Order Specific Question:   Reason for Exam (SYMPTOM  OR DIAGNOSIS REQUIRED)    Answer:   right wrist pain    Order Specific Question:   Preferred imaging location?    Answer:   [facility]   DG Knee AP/LAT W/[place] Left    Standing Status:   Future    Number of Occurrences:   1    Standing Expiration Date:   08/20/2022    Order Specific Question:   Reason for Exam (SYMPTOM  OR DIAGNOSIS REQUIRED)    Answer:   bilateral knee pain    Order Specific Question:   Preferred imaging location?    Answer:   [facility]   DG Knee AP/LAT W/[place] Right    Standing Status:   Future    Number of Occurrences:   1    Standing Expiration Date:   08/20/2022    Order Specific Question:   Reason for Exam (SYMPTOM  OR DIAGNOSIS REQUIRED)    Answer:   bilateral knee pain    Order Specific Question:   Preferred imaging location?    Answer:   [facility]   Ambulatory referral to Occupational Therapy    Referral Priority:   Routine    Referral Type:   Occupational Therapy    Referral Reason:   Specialty Services Required    Requested Specialty:   Occupational Therapy    Number of Visits Requested:   1   No orders  of the defined types were placed in this encounter.    Discussed warning signs or symptoms. Please see discharge instructions. Patient expresses understanding.   The above documentation has been reviewed and is accurate and complete Lynne Leader, M.D.

## 2022-07-20 NOTE — Patient Instructions (Addendum)
Thank you for coming in today.   Please get an Xray today before you leave   Use an over the counter wrist brace  I've referred you to Occupational Therapy.  Let us know if you don't hear from them in one week.   You received an injection today. Seek immediate medical attention if the joint becomes red, extremely painful, or is oozing fluid.   Check back in 1 month

## 2022-07-21 ENCOUNTER — Ambulatory Visit (INDEPENDENT_AMBULATORY_CARE_PROVIDER_SITE_OTHER): Payer: Commercial Managed Care - PPO | Admitting: Family Medicine

## 2022-07-21 ENCOUNTER — Encounter: Payer: Self-pay | Admitting: Family Medicine

## 2022-07-21 ENCOUNTER — Ambulatory Visit: Payer: Commercial Managed Care - PPO | Admitting: Rehabilitative and Restorative Service Providers"

## 2022-07-21 ENCOUNTER — Encounter: Payer: Self-pay | Admitting: Rehabilitative and Restorative Service Providers"

## 2022-07-21 ENCOUNTER — Other Ambulatory Visit: Payer: Self-pay

## 2022-07-21 VITALS — BP 120/72 | HR 76 | Temp 98.2°F | Ht 70.0 in | Wt 204.2 lb

## 2022-07-21 DIAGNOSIS — Z0000 Encounter for general adult medical examination without abnormal findings: Secondary | ICD-10-CM | POA: Diagnosis not present

## 2022-07-21 DIAGNOSIS — R600 Localized edema: Secondary | ICD-10-CM | POA: Diagnosis not present

## 2022-07-21 DIAGNOSIS — M25531 Pain in right wrist: Secondary | ICD-10-CM | POA: Diagnosis not present

## 2022-07-21 DIAGNOSIS — M25631 Stiffness of right wrist, not elsewhere classified: Secondary | ICD-10-CM | POA: Diagnosis not present

## 2022-07-21 DIAGNOSIS — M6281 Muscle weakness (generalized): Secondary | ICD-10-CM | POA: Diagnosis not present

## 2022-07-21 DIAGNOSIS — E119 Type 2 diabetes mellitus without complications: Secondary | ICD-10-CM | POA: Diagnosis not present

## 2022-07-21 LAB — CBC
HCT: 37.4 % — ABNORMAL LOW (ref 39.0–52.0)
Hemoglobin: 12.6 g/dL — ABNORMAL LOW (ref 13.0–17.0)
MCHC: 33.6 g/dL (ref 30.0–36.0)
MCV: 99.3 fl (ref 78.0–100.0)
Platelets: 247 10*3/uL (ref 150.0–400.0)
RBC: 3.76 Mil/uL — ABNORMAL LOW (ref 4.22–5.81)
RDW: 14.9 % (ref 11.5–15.5)
WBC: 5.6 10*3/uL (ref 4.0–10.5)

## 2022-07-21 LAB — COMPREHENSIVE METABOLIC PANEL
ALT: 18 U/L (ref 0–53)
AST: 17 U/L (ref 0–37)
Albumin: 4.4 g/dL (ref 3.5–5.2)
Alkaline Phosphatase: 65 U/L (ref 39–117)
BUN: 20 mg/dL (ref 6–23)
CO2: 27 mEq/L (ref 19–32)
Calcium: 9.9 mg/dL (ref 8.4–10.5)
Chloride: 104 mEq/L (ref 96–112)
Creatinine, Ser: 0.81 mg/dL (ref 0.40–1.50)
GFR: 90.17 mL/min (ref >60.00)
Glucose, Bld: 140 mg/dL — ABNORMAL HIGH (ref 70–99)
Potassium: 4.4 mEq/L (ref 3.5–5.1)
Sodium: 141 mEq/L (ref 135–145)
Total Bilirubin: 0.4 mg/dL (ref 0.2–1.2)
Total Protein: 7.2 g/dL (ref 6.0–8.3)

## 2022-07-21 LAB — LIPID PANEL
Cholesterol: 158 mg/dL (ref 0–200)
HDL: 54.7 mg/dL (ref >39.00)
LDL Cholesterol: 88 mg/dL (ref 0–99)
NonHDL: 103.52
Total CHOL/HDL Ratio: 3
Triglycerides: 77 mg/dL (ref 0.0–149.0)
VLDL: 15.4 mg/dL (ref 0.0–40.0)

## 2022-07-21 LAB — HEMOGLOBIN A1C: Hgb A1c MFr Bld: 7.9 % — ABNORMAL HIGH (ref 4.6–6.5)

## 2022-07-21 LAB — MICROALBUMIN / CREATININE URINE RATIO
Creatinine,U: 128.4 mg/dL
Microalb Creat Ratio: 1.6 mg/g (ref 0.0–30.0)
Microalb, Ur: 2.1 mg/dL — ABNORMAL HIGH (ref 0.0–1.9)

## 2022-07-21 NOTE — Therapy (Signed)
OUTPATIENT OCCUPATIONAL THERAPY ORTHO EVALUATION  Patient Name: Reginald Rios MRN: [id number] DOB:May 09, 1955, 67 y.o., male Today's Date: 07/21/2022  PCP: Riki Sheer, DO REFERRING PROVIDER: Gregor Hams, MD   END OF SESSION:  OT End of Session - 07/21/22 1301     Visit Number 1    Number of Visits 8    Date for OT Re-Evaluation 09/04/22    Authorization Type [employer] employee insurance    OT Start Time 1301    OT Stop Time 1400    OT Time Calculation (min) 59 min    Equipment Utilized During Treatment tubigrip    Activity Tolerance Patient limited by pain;No increased pain;Patient limited by fatigue;Patient tolerated treatment well    Behavior During Therapy [facility] for tasks assessed/performed             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   BPPV (benign paroxysmal positional vertigo) 08/14/2014   CAD (coronary artery disease)    2/19 PCI/DESx1 to Lcx   Cervical disc disorder with radiculopathy of cervical region 10/07/2015   Cervical radicular pain 01/10/2019   Chronic cough 04/13/2019   Decreased cardiac ejection fraction 04/02/2015   Diabetes mellitus without complication ([place])    Dyspnea on exertion 05/15/2015   Erectile dysfunction 07/11/2019   Exercise-induced asthma    Family history of early CAD 07/05/2017   Fatigue 05/15/2015   Hallux rigidus, right foot 09/15/2021   Heart murmur    Hemarthrosis of right elbow 02/03/2016   Hoarseness 04/13/2019   Hyperlipidemia    family hx of high cholesterol   Hypogonadism in male    Incomplete rotator cuff tear  09/10/2015   Injected 09/10/2015   Lateral epicondylitis of right elbow 07/28/2019   Low back pain    Low back pain at multiple sites 02/05/2021   Low vitamin D level 04/22/2017   Mild persistent asthma/cough variant asthma 04/13/2019   Myofascial pain syndrome, cervical 07/28/2019   Nonischemic cardiomyopathy ([place]) 05/15/2015   Nontraumatic incomplete tear of right rotator cuff 07/25/2019   Onychomycosis 05/25/2018   Osteoarthritis    Pain in right ankle and joints of right foot 10/05/2016   Palpitations 07/06/2017   Shoulder bursitis 10/12/2014   Injected in 10/12/2014  Repeat 01/21/2015   Small thenar eminence 02/03/2016   Spinal stenosis, lumbar region, without neurogenic claudication 10/12/2014   Spondylolisthesis at L5-S1 level 05/09/2021   Strain of latissimus dorsi muscle 01/21/2015   Tendinopathy of right biceps tendon 07/25/2019   Type 2 diabetes mellitus with hyperglycemia, without long-term current use of insulin ([place]) 12/22/2013   Ulnar neuropathy at elbow of right upper extremity 01/03/2019   Past Surgical History:  Procedure Laterality Date   CORONARY STENT INTERVENTION N/A 06/23/2017   Procedure: CORONARY STENT INTERVENTION;  Surgeon: Leonie Man, MD;  Location: [place] [facility];  Service: Cardiovascular;  Laterality: N/A;   INTRAVASCULAR PRESSURE WIRE/FFR STUDY N/A 06/23/2017   Procedure: INTRAVASCULAR PRESSURE WIRE/FFR STUDY;  Surgeon: Leonie Man, MD;  Location: [place] [facility];  Service: Cardiovascular;  Laterality: N/A;   KNEE ARTHROSCOPY     LEFT  HEART CATH AND CORONARY ANGIOGRAPHY N/A 06/23/2017   Procedure: LEFT HEART CATH AND CORONARY ANGIOGRAPHY;  Surgeon: Leonie Man, MD;  Location: [place] [facility];  Service: Cardiovascular;  Laterality: N/A;   SHOULDER ARTHROSCOPY WITH BICEPSTENOTOMY Right 09/06/2019   Procedure: SHOULDER ARTHROSCOPY WITH BICEPSTENOTOMY;  Surgeon: Leandrew Koyanagi, MD;  Location: [place];  Service:  Orthopedics;  Laterality: Right;   SHOULDER ARTHROSCOPY WITH SUBACROMIAL DECOMPRESSION Right 09/06/2019   Procedure: RIGHT SHOULDER ARTHROSCOPY WITH EXTENSIVE DEBRIDEMENT, SUBACROMIAL DECOMPRESSION, BICEPS TENOTOMY;  Surgeon: Leandrew Koyanagi, MD;  Location: [place];  Service: Orthopedics;  Laterality: Right;   TENOTOMY ACHILLES TENDON     Patient Active Problem List   Diagnosis Date Noted   Hallux rigidus, right foot 09/15/2021   Spondylolisthesis at L5-S1 level 05/09/2021   Low back pain at multiple sites 02/05/2021   Osteoarthritis    Heart murmur    Exercise-induced asthma    Diabetes mellitus without complication (HCC)    Arthritis    Anemia    Myofascial pain syndrome, cervical 07/28/2019   Lateral epicondylitis of right elbow 07/28/2019   Nontraumatic incomplete tear of right rotator cuff 07/25/2019   Tendinopathy of right biceps tendon 07/25/2019   Erectile dysfunction 07/11/2019   Mild persistent asthma/cough variant asthma 04/13/2019   Allergic rhinitis with a possible nonallergic component 04/13/2019   Acid reflux 04/13/2019   Chronic cough 04/13/2019   Hoarseness 04/13/2019   Cervical radicular pain 01/10/2019   Ulnar neuropathy at elbow of right upper extremity 01/03/2019   Acute bilateral low back pain without sciatica 01/03/2019   Onychomycosis 05/25/2018   Palpitations 07/06/2017   CAD (coronary artery disease) 07/05/2017   Family history of early CAD 07/05/2017   Abnormal findings on diagnostic imaging of cardiovascular system 06/23/2017   Low vitamin D level 04/22/2017   Pain in right ankle and joints of right foot 10/05/2016   Hemarthrosis of right elbow 02/03/2016   Small thenar eminence 02/03/2016   Cervical disc disorder with radiculopathy of cervical region 10/07/2015   Incomplete rotator cuff tear 09/10/2015   Nonischemic cardiomyopathy ([place]) 05/15/2015   Fatigue 05/15/2015   Dyspnea on exertion 05/15/2015   Decreased cardiac ejection  fraction 04/02/2015   Strain of latissimus dorsi muscle 01/21/2015   Shoulder bursitis 10/12/2014   Spinal stenosis, lumbar region, without neurogenic claudication 10/12/2014   BPPV (benign paroxysmal positional vertigo) 08/14/2014   Hyperlipidemia 01/19/2014   Anemia, unspecified 01/19/2014   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   Type 2 diabetes mellitus with hyperglycemia, without long-term current use of insulin ([place]) 12/22/2013   Hypogonadism in male 12/22/2013   Arthritis of hand, left 12/22/2013    ONSET DATE: DOI 07/19/22  REFERRING DIAG: M25.531 (ICD-10-CM) - Right wrist pain   THERAPY DIAG:  Pain in right wrist - Plan: Ot plan of care cert/re-cert  Stiffness of right wrist, not elsewhere classified - Plan: Ot plan of care cert/re-cert  Muscle weakness (generalized) - Plan: Ot plan of care cert/re-cert  Localized edema - Plan: Ot plan of care cert/re-cert  Rationale for Evaluation and Treatment: Rehabilitation  SUBJECTIVE:   SUBJECTIVE STATEMENT: He states grasping a piece of wood tightly and turning his right wrist, feeling and hearing a click of sorts and having pain and swelling afterwards.  He saw his sports med doctor the next day and now is present today.  He states having some pain at rest in the right wrist and increased pain with  motion and weightbearing.  He states having CuTR in 2020 with chronic paresthesias to the small finger, but now has increased paresthesia to ring finger as well since injury.  He enjoys pickleballing and states having a training camp coming up in less than 2 weeks in [place], which he hopes he is able to attend and participate in.   PERTINENT HISTORY: Per MD note: "right wrist pain x 1 day, injured while woodworking. Was turning a clamp and heard/felt a pop. Pt locates pain to dorsal aspect of the wrist. Pain radiating into the arm up to the elbow. Notes, limited ROM, swelling, weakness., numbness in the hand  and fingers.Marland KitchenMarland KitchenImpression: Wrist effusion and ECU tenosynovitis " he also has history of chronic back pain and problems.  PRECAUTIONS: None; WEIGHT BEARING RESTRICTIONS: Yes limit WB in Rt wrist/hand for 3-4 weeks, or as tolerated.   PAIN:  Are you having pain? Yes: NPRS scale: 6/10 at rest and when pushing on wrist up to 8-9/10 Pain location: ECU are dorsal ulnar wrist and SF, also increased ulnar numbness Pain description: pulling Aggravating factors: weight bearing/end-range motion Relieving factors: rest in brace  FALLS: Has patient fallen in last 6 months? No  LIVING ENVIRONMENT: Lives with: lives with their family  PLOF: Independent  PATIENT GOALS: To get back to full strength and mobility in right wrist to play pickleball   OBJECTIVE: (All objective assessments below are from initial evaluation on: 07/21/22 unless otherwise specified.)   HAND DOMINANCE: Right   ADLs: Overall ADLs: States decreased ability to grab, hold household objects, pain and inability to open containers, perform FMS tasks (manipulate fasteners on clothing), mild to moderate bathing problems as well.    FUNCTIONAL OUTCOME MEASURES: Eval: Quck DASH 70% impairment today  (Higher % Score  =  More Impairment)    UPPER EXTREMITY ROM     Shoulder to Wrist AROM Right eval Left eval  Forearm supination 49 75  Forearm pronation  89 80  Wrist flexion 53 61  Wrist extension 45 55  Wrist ulnar deviation 38   Wrist radial deviation 17   (Blank rows = not tested)   Hand AROM Right eval  Full Fist Ability (or Gap to Distal Palmar Crease) Yes but tender  Thumb Opposition  (Kapandji Scale)  5  (Blank rows = not tested)   UPPER EXTREMITY MMT:    Eval:  NT at eval due to recent and still healing injuries. Will be tested when appropriate.   MMT Right TBD  Elbow flexion   Elbow extension   Forearm supination   Forearm pronation   Wrist flexion   Wrist extension   Wrist ulnar deviation   Wrist  radial deviation   (Blank rows = not tested)  HAND FUNCTION: Eval: Observed weakness in affected hand.  Grip strength Right: 61 lbs, Left: 72 lbs   COORDINATION: Eval: Observed coordination impairments with affected Rt hand. Details TBD.  9 Hole Peg Test Right: TBDsec  SENSATION: Eval: Light touch intact today, though diminished in Rt hand ulnar nerve distribution     EDEMA:   Eval:  Mildly swollen in Rt wrist today  COGNITION: Eval: Overall cognitive status: WFL for evaluation today   OBSERVATIONS:   Eval: He does have acute swelling about the right wrist, especially near the ECU and ulnar styloid.  Fortunately he is not very tender to palpation today.  He does have signs of chronic ulnar nerve wasting in intrinsic hand muscles.  Motion and strength are somewhat  limited in the right arm and wrist now. No noted instability.  Presents as strain of extensors, specifically ECU in Rt wrist, as well as exacerbation of  chronic ulnar neuritis.    TODAY'S TREATMENT:  Post-evaluation treatment:  He was given initial education for self-care/safety to prevent as much acute swelling and pain as possible today.  He was also given a printed out home exercise program with the exercises listed below for light active range of motion exercises and nerve gliding to help with symptoms.  (These things are as listed below).  He states understanding all info given today though he did seem mildly confused and likely will need reviewed at some point this week if possible.  He did tolerate motion well today, and a limited range of motion.  He should remove his wrist brace 3-4 times a day for light ADLs and light, non-painful ROM. Icing may be performed 1-2 times a day (and/or contrast baths). The goal is to reduce the inflammatory response.He should minimize repetitive activities requiring sustained wrist extension or pulling with the involved hand. In addition, he was encouraged to avoid grasping activities  which demand resistance with strong ulnar deviation and simultaneous wrist flexion.   Exercises - Turn [employer] Facing Up & Down  - 4-6 x daily - 10-15 reps - Bend and Pull Back Wrist SLOWLY  - 4 x daily - 10-15 reps - "Windshield Wipers"   - 4 x daily - 10-15 reps - Wrist AROM Dart Throwers Motion  - 4 x daily - 10-15 reps - Ulnar Nerve Flossing  - 3-4 x daily - 1-2 sets - 5-10 reps - Finger Spreading  - 4-6 x daily - 10-15 reps - Tendon Glides  - 4-6 x daily - 3-5 reps - 2-3 seconds hold  PATIENT EDUCATION: Education details: See tx section above for details  Person educated: Patient Education method: Veterinary surgeon, Teach back, Handouts  Education comprehension: States and demonstrates understanding, Additional Education required    HOME EXERCISE PROGRAM: Access Code: [facility] URL: [email or web address] Date: 07/21/2022 Prepared by: Benito Mccreedy   GOALS: Goals reviewed with patient? Yes   SHORT TERM GOALS: (STG required if POC>30 days) Target Date: 08/07/22  Pt will demo/state understanding of initial HEP to improve pain levels and prerequisite motion. Goal status: INITIAL   LONG TERM GOALS: Target Date: 09/04/22  Pt will improve functional ability by decreased impairment per Quick DASH assessment from 70% to 15% or better, for better quality of life. Goal status: INITIAL  2.  Pt will improve A/ROM in Rt wrist flex/ext from 53/45 to at least 60/55, to have prerequisite functional motion for tasks like reach and grasp.  Goal status: INITIAL  4.  Pt will improve strength in Rt wrist ext to at least 4+5 MMT to have increased functional ability to carry out selfcare and higher-level homecare tasks with no difficulty. Goal status: INITIAL  5.  Pt will decrease pain at rest from 6/10 to 1-2/10 to have better sleep and occupational participation in daily roles. Goal status: INITIAL  ASSESSMENT:  CLINICAL IMPRESSION: Patient is a 67 y.o. male who was seen  today for occupational therapy evaluation for apparent ECU strain, swelling, pain and increased ulnar nerve paresthesia in right hand and wrist.  These things are decreasing his functional ability and quality of life and he will benefit from outpatient occupational therapy to increase quality of life.   PERFORMANCE DEFICITS: in functional skills including ADLs, IADLs, coordination, edema, ROM, strength, pain, fascial restrictions,  flexibility, Gross motor control, body mechanics, endurance, decreased knowledge of precautions, and UE functional use, cognitive skills including problem solving and safety awareness, and psychosocial skills including coping strategies, habits, and routines and behaviors.   IMPAIRMENTS: are limiting patient from ADLs, IADLs, work, leisure, and social participation.   COMORBIDITIES: may have co-morbidities  that affects occupational performance. Patient will benefit from skilled OT to address above impairments and improve overall function.  MODIFICATION OR ASSISTANCE TO COMPLETE EVALUATION: No modification of tasks or assist necessary to complete an evaluation.  OT OCCUPATIONAL PROFILE AND HISTORY: Problem focused assessment: Including review of records relating to presenting problem.  CLINICAL DECISION MAKING: LOW - limited treatment options, no task modification necessary  REHAB POTENTIAL: Excellent  EVALUATION COMPLEXITY: Low      PLAN:  OT FREQUENCY: 1-2x/week  OT DURATION: 6 weeks  PLANNED INTERVENTIONS: self care/ADL training, therapeutic exercise, therapeutic activity, neuromuscular re-education, manual therapy, passive range of motion, splinting, electrical stimulation, ultrasound, iontophoresis, fluidotherapy, compression bandaging, moist heat, cryotherapy, contrast bath, patient/family education, coping strategies training, and Dry needling  RECOMMENDED OTHER SERVICES: none now   CONSULTED AND AGREED WITH PLAN OF CARE: Patient  PLAN FOR NEXT  SESSION: Review ROM, pain, initial recommendations, upgrade to light stretches in 2-3 weeks as tolerated.    Benito Mccreedy, OTR/L, CHT 07/21/2022, 3:19 PM

## 2022-07-21 NOTE — Patient Instructions (Addendum)
Give [place] 2-3 business days to get the results of your labs back.   The Shingrix vaccine (for shingles) is a 2 shot series spaced 2-6 months apart. It can make people feel low energy, achy and almost like they have the flu for 48 hours after injection. 1/5 people can have nausea and/or vomiting. Please plan accordingly when deciding on when to get this shot. Call our office for a nurse visit appointment to get this. The second shot of the series is less severe regarding the side effects, but it still lasts 48 hours.   Keep the diet clean and stay active.  Consider a non-scented lotion to put on your back.   Let us know if you need anything.

## 2022-07-21 NOTE — Progress Notes (Signed)
Chief Complaint  Patient presents with   Annual Exam   Well Male Reginald Rios is here for a complete physical.   His last physical was >1 year ago.  Current diet: in general, diet is OK.   Current exercise: yoga, pickle ball, yoga Weight trend: stable Fatigue out of ordinary? No. Seat belt? Yes.   Advanced directive? No  Health maintenance Shingrix- No Colonoscopy- Yes Tetanus- Yes Hep C- Yes Pneumonia vaccine- Yes  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   BPPV (benign paroxysmal positional vertigo) 08/14/2014   CAD (coronary artery disease)    2/19 PCI/DESx1 to Lcx   Cervical disc disorder with radiculopathy of cervical region 10/07/2015   Cervical radicular pain 01/10/2019   Chronic cough 04/13/2019   Decreased cardiac ejection fraction 04/02/2015   Diabetes mellitus without complication ([place])    Dyspnea on exertion 05/15/2015   Erectile dysfunction 07/11/2019   Exercise-induced asthma    Family history of early CAD 07/05/2017   Fatigue 05/15/2015   Hallux rigidus, right foot 09/15/2021   Heart murmur    Hemarthrosis of right elbow 02/03/2016   Hoarseness 04/13/2019   Hyperlipidemia    family hx of high cholesterol   Hypogonadism in male    Incomplete rotator cuff tear 09/10/2015   Injected 09/10/2015   Lateral epicondylitis of right elbow 07/28/2019   Low back pain    Low back pain at multiple sites 02/05/2021   Low vitamin D level 04/22/2017   Mild persistent asthma/cough variant asthma 04/13/2019   Myofascial pain syndrome, cervical 07/28/2019   Nonischemic  cardiomyopathy ([place]) 05/15/2015   Nontraumatic incomplete tear of right rotator cuff 07/25/2019   Onychomycosis 05/25/2018   Osteoarthritis    Pain in right ankle and joints of right foot 10/05/2016   Palpitations 07/06/2017   Shoulder bursitis 10/12/2014   Injected in 10/12/2014  Repeat 01/21/2015   Small thenar eminence 02/03/2016   Spinal stenosis, lumbar region, without neurogenic claudication 10/12/2014   Spondylolisthesis at L5-S1 level 05/09/2021   Strain of latissimus dorsi muscle 01/21/2015   Tendinopathy of right biceps tendon 07/25/2019   Type 2 diabetes mellitus with hyperglycemia, without long-term current use of insulin ([place]) 12/22/2013   Ulnar neuropathy at elbow of right upper extremity 01/03/2019     Past Surgical History:  Procedure Laterality Date   CORONARY STENT INTERVENTION N/A 06/23/2017   Procedure: CORONARY STENT INTERVENTION;  Surgeon: Leonie Man, MD;  Location: [facility];  Service: Cardiovascular;  Laterality: N/A;   INTRAVASCULAR PRESSURE WIRE/FFR STUDY N/A 06/23/2017   Procedure: INTRAVASCULAR PRESSURE WIRE/FFR STUDY;  Surgeon: Leonie Man, MD;  Location: [facility];  Service: Cardiovascular;  Laterality: N/A;   KNEE ARTHROSCOPY     LEFT HEART CATH AND CORONARY ANGIOGRAPHY N/A 06/23/2017   Procedure: LEFT HEART CATH AND CORONARY ANGIOGRAPHY;  Surgeon: Leonie Man, MD;  Location: [facility];  Service: Cardiovascular;  Laterality: N/A;   SHOULDER ARTHROSCOPY WITH BICEPSTENOTOMY Right 09/06/2019   Procedure: SHOULDER ARTHROSCOPY WITH BICEPSTENOTOMY;  Surgeon: Leandrew Koyanagi, MD;  Location: [facility];  Service:  Orthopedics;  Laterality: Right;   SHOULDER ARTHROSCOPY WITH SUBACROMIAL DECOMPRESSION Right 09/06/2019   Procedure: RIGHT SHOULDER ARTHROSCOPY WITH EXTENSIVE DEBRIDEMENT, SUBACROMIAL DECOMPRESSION, BICEPS TENOTOMY;  Surgeon: Leandrew Koyanagi, MD;  Location: [facility];  Service: Orthopedics;   Laterality: Right;   TENOTOMY ACHILLES TENDON      Medications  Current Outpatient Medications on File Prior to Visit  Medication Sig Dispense Refill   albuterol (VENTOLIN HFA) 108 (90 Base) MCG/ACT inhaler Inhale 2 puffs by mouth every 4-6 hours as needed for coughing or wheezing spells 6.7 g 1   azelastine (ASTELIN) 0.1 % nasal spray Place 2 sprays into both nostrils 2 (two) times daily as needed for rhinitis (nasal congestion). 30 mL 3   baclofen (LIORESAL) 10 MG tablet Take 1 tablet (10 mg total) by mouth 3 (three) times daily as needed for muscle spasms. 30 each 1   Blood Glucose Monitoring Suppl (FREESTYLE FREEDOM LITE) w/Device KIT Use to check blood sugar daily. 1 kit 0   Calcium Carbonate-Vit D-Min (QC CALCIUM-MAGNESIUM-ZINC-D3 PO) Take 1 tablet by mouth at bedtime.     clopidogrel (PLAVIX) 75 MG tablet Take 1 tablet (75 mg total) by mouth daily with breakfast. 90 tablet 2   diclofenac Sodium (VOLTAREN) 1 % GEL Apply 2 g topically 4 (four) times daily. 100 g 0   ezetimibe (ZETIA) 10 MG tablet TAKE 1 TABLET BY MOUTH ONCE DAILY 90 tablet 1   fluticasone (FLOVENT HFA) 110 MCG/ACT inhaler Inhale 2 puffs into the lungs 2 (two) times daily. Use with spacer. Rinse, gargle, and spit after use. 12 g 5   glucose blood (FREESTYLE LITE) test strip Use daily to check blood sugar. 100 each 3   Lancets (FREESTYLE) lancets Use daily to check blood sugar 100 each 3   losartan (COZAAR) 25 MG tablet Take 1 tablet (25 mg total) by mouth daily. 90 tablet 1   metFORMIN (GLUCOPHAGE) 1000 MG tablet Take 1 tablet (1,000 mg total) by mouth 2 (two) times daily with a meal. 180 tablet 3   methylphenidate (RITALIN) 20 MG tablet Take 1 tablet (20 mg total) by mouth 2 (two) times daily. 60 tablet 0   methylphenidate (RITALIN) 20 MG tablet Take 1 tablet (20 mg total) by mouth 2 (two) times daily. 60 tablet 0   methylphenidate (RITALIN) 20 MG tablet Take 1 tablet (20 mg total) by mouth 2 (two) times daily. 60 tablet 0    montelukast (SINGULAIR) 10 MG tablet TAKE 1 TABLET (10 MG TOTAL) BY MOUTH DAILY. 90 tablet 3   Multiple Vitamin (MULTIVITAMIN) tablet Take 1 tablet by mouth daily.     nitroGLYCERIN (NITROSTAT) 0.4 MG SL tablet Place 1 tablet (0.4 mg total) under the tongue every 5 (five) minutes as needed. 25 tablet 2   pantoprazole (PROTONIX) 40 MG tablet Take 1 tablet (40 mg total) by mouth daily. 30 tablet 3   pioglitazone (ACTOS) 45 MG tablet Take 1 tablet (45 mg total) by mouth daily. 30 tablet 5   tadalafil (CIALIS) 20 MG tablet Take 0.5-1 tablets (10-20 mg total) by mouth every other day as needed for erectile dysfunction. 10 tablet 5   testosterone (ANDROGEL) 50 MG/5GM (1%) GEL APPLY 2 APPLICATIONS ON TO THE SKIN DAILY 300 g 5   Allergies Allergies  Allergen Reactions   Invokana [Canagliflozin] Rash   Grass Pollen(K-O-R-T-Swt Vern)    Lisinopril Cough   Semaglutide Nausea Only    Family History Family History  Problem Relation Age of Onset  Lung cancer Mother 79   Cancer Mother        lung cancer   Coronary artery disease Father 52   Heart disease Father    Hypothyroidism Brother    Food Allergy Son    Asthma Paternal Aunt    Colon cancer Neg Hx    Prostate cancer Neg Hx    Allergic rhinitis Neg Hx    Angioedema Neg Hx    Eczema Neg Hx    Immunodeficiency Neg Hx    Urticaria Neg Hx     Review of Systems: Constitutional:  no fevers Eye:  no recent significant change in vision Ears:  No changes in hearing Nose/Mouth/Throat:  no complaints of nasal congestion, no sore throat Cardiovascular: no chest pain Respiratory:  No shortness of breath Gastrointestinal:  No change in bowel habits GU:  No frequency Integumentary:  no abnormal skin lesions reported Neurologic:  no headaches Endocrine:  denies unexplained weight changes  Exam BP 120/72 (BP Location: Left Arm, Patient Position: Sitting, Cuff Size: Normal)   Pulse 76   Temp 98.2 F (36.8 C) (Oral)   Ht 5\' 10"  (1.778 m)    Wt 204 lb 4 oz (92.6 kg)   SpO2 97%   BMI 29.31 kg/m  General:  well developed, well nourished, in no apparent distress Skin:  no significant moles, warts, or growths Head:  no masses, lesions, or tenderness Eyes:  pupils equal and round, sclera anicteric without injection Ears:  canals without lesions, TMs shiny without retraction, no obvious effusion, no erythema Nose:  nares patent, mucosa normal Throat/Pharynx:  lips and gingiva without lesion; tongue and uvula midline; non-inflamed pharynx; no exudates or postnasal drainage Lungs:  clear to auscultation, breath sounds equal bilaterally, no respiratory distress Cardio:  regular rate and rhythm, no LE edema or bruits Rectal: Deferred GI: BS+, S, NT, ND, no masses or organomegaly Musculoskeletal:  symmetrical muscle groups noted without atrophy or deformity Neuro:  gait normal; deep tendon reflexes normal and symmetric Psych: well oriented with normal range of affect and appropriate judgment/insight  Assessment and Plan  Well adult exam  Diabetes mellitus without complication ([place]) - Plan: CBC, Comprehensive metabolic panel, Lipid panel, Hemoglobin A1c, Microalbumin / creatinine urine ratio   Well 67 y.o. male. Counseled on diet and exercise. Advanced directive form provided today.  Shingrix rec'd.  Other orders as above. Follow up in 6 mo.  The patient voiced understanding and agreement to the plan.  [place], DO 07/21/22 9:50 AM

## 2022-07-22 ENCOUNTER — Other Ambulatory Visit (HOSPITAL_BASED_OUTPATIENT_CLINIC_OR_DEPARTMENT_OTHER): Payer: Self-pay

## 2022-07-22 NOTE — Progress Notes (Signed)
Right wrist x-ray shows no fractures.  It does show some wrist arthritis.

## 2022-07-22 NOTE — Progress Notes (Signed)
Right knee x-ray shows some arthritis changes underneath the kneecap.

## 2022-07-22 NOTE — Progress Notes (Signed)
Left knee x-ray shows some arthritis underneath the kneecap.

## 2022-07-23 NOTE — Therapy (Signed)
OUTPATIENT OCCUPATIONAL THERAPY TREATMENT NOTE  Patient Name: Reginald Rios MRN: [id number] DOB:15-Sep-1955, 67 y.o., male Today's Date: 07/24/2022  PCP: Riki Sheer, DO REFERRING PROVIDER: Gregor Hams, MD   END OF SESSION:  OT End of Session - 07/24/22 1100     Visit Number 2    Number of Visits 8    Date for OT Re-Evaluation 09/04/22    Authorization Type [employer] employee insurance    OT Start Time 1101    OT Stop Time [id number]    OT Time Calculation (min) 64 min    Equipment Utilized During Treatment tubigrip    Activity Tolerance Patient limited by pain;No increased pain;Patient limited by fatigue;Patient tolerated treatment well    Behavior During Therapy [facility] for tasks assessed/performed              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   BPPV (benign paroxysmal positional vertigo) 08/14/2014   CAD (coronary artery disease)    2/19 PCI/DESx1 to Lcx   Cervical disc disorder with radiculopathy of cervical region 10/07/2015   Cervical radicular pain 01/10/2019   Chronic cough 04/13/2019   Decreased cardiac ejection fraction 04/02/2015   Diabetes mellitus without complication ([place])    Dyspnea on exertion 05/15/2015   Erectile dysfunction 07/11/2019   Exercise-induced asthma    Family history of early CAD 07/05/2017   Fatigue 05/15/2015   Hallux rigidus, right foot 09/15/2021   Heart murmur    Hemarthrosis of right elbow 02/03/2016   Hoarseness 04/13/2019   Hyperlipidemia    family hx of high cholesterol   Hypogonadism in male    Incomplete rotator cuff tear  09/10/2015   Injected 09/10/2015   Lateral epicondylitis of right elbow 07/28/2019   Low back pain    Low back pain at multiple sites 02/05/2021   Low vitamin D level 04/22/2017   Mild persistent asthma/cough variant asthma 04/13/2019   Myofascial pain syndrome, cervical 07/28/2019   Nonischemic cardiomyopathy ([place]) 05/15/2015   Nontraumatic incomplete tear of right rotator cuff 07/25/2019   Onychomycosis 05/25/2018   Osteoarthritis    Pain in right ankle and joints of right foot 10/05/2016   Palpitations 07/06/2017   Shoulder bursitis 10/12/2014   Injected in 10/12/2014  Repeat 01/21/2015   Small thenar eminence 02/03/2016   Spinal stenosis, lumbar region, without neurogenic claudication 10/12/2014   Spondylolisthesis at L5-S1 level 05/09/2021   Strain of latissimus dorsi muscle 01/21/2015   Tendinopathy of right biceps tendon 07/25/2019   Type 2 diabetes mellitus with hyperglycemia, without long-term current use of insulin ([place]) 12/22/2013   Ulnar neuropathy at elbow of right upper extremity 01/03/2019   Past Surgical History:  Procedure Laterality Date   CORONARY STENT INTERVENTION N/A 06/23/2017   Procedure: CORONARY STENT INTERVENTION;  Surgeon: Leonie Man, MD;  Location: [facility];  Service: Cardiovascular;  Laterality: N/A;   INTRAVASCULAR PRESSURE WIRE/FFR STUDY N/A 06/23/2017   Procedure: INTRAVASCULAR PRESSURE WIRE/FFR STUDY;  Surgeon: Leonie Man, MD;  Location: [facility];  Service: Cardiovascular;  Laterality: N/A;   KNEE ARTHROSCOPY  LEFT HEART CATH AND CORONARY ANGIOGRAPHY N/A 06/23/2017   Procedure: LEFT HEART CATH AND CORONARY ANGIOGRAPHY;  Surgeon: Leonie Man, MD;  Location: [facility];  Service: Cardiovascular;  Laterality: N/A;   SHOULDER ARTHROSCOPY WITH BICEPSTENOTOMY Right 09/06/2019   Procedure: SHOULDER ARTHROSCOPY WITH BICEPSTENOTOMY;  Surgeon: Leandrew Koyanagi, MD;  Location: [facility];  Service:  Orthopedics;  Laterality: Right;   SHOULDER ARTHROSCOPY WITH SUBACROMIAL DECOMPRESSION Right 09/06/2019   Procedure: RIGHT SHOULDER ARTHROSCOPY WITH EXTENSIVE DEBRIDEMENT, SUBACROMIAL DECOMPRESSION, BICEPS TENOTOMY;  Surgeon: Leandrew Koyanagi, MD;  Location: [facility];  Service: Orthopedics;  Laterality: Right;   TENOTOMY ACHILLES TENDON     Patient Active Problem List   Diagnosis Date Noted   Hallux rigidus, right foot 09/15/2021   Spondylolisthesis at L5-S1 level 05/09/2021   Low back pain at multiple sites 02/05/2021   Osteoarthritis    Heart murmur    Exercise-induced asthma    Diabetes mellitus without complication ([facility])    Arthritis    Anemia    Myofascial pain syndrome, cervical 07/28/2019   Lateral epicondylitis of right elbow 07/28/2019   Nontraumatic incomplete tear of right rotator cuff 07/25/2019   Tendinopathy of right biceps tendon 07/25/2019   Erectile dysfunction 07/11/2019   Mild persistent asthma/cough variant asthma 04/13/2019   Allergic rhinitis with a possible nonallergic component 04/13/2019   Acid reflux 04/13/2019   Chronic cough 04/13/2019   Hoarseness 04/13/2019   Cervical radicular pain 01/10/2019   Ulnar neuropathy at elbow of right upper extremity 01/03/2019   Acute bilateral low back pain without sciatica 01/03/2019   Onychomycosis 05/25/2018   Palpitations 07/06/2017   CAD (coronary artery disease) 07/05/2017   Family history of early CAD 07/05/2017   Abnormal findings on diagnostic imaging of cardiovascular system 06/23/2017   Low vitamin D level 04/22/2017   Pain in right ankle and joints of right foot 10/05/2016   Hemarthrosis of right elbow 02/03/2016   Small thenar eminence 02/03/2016   Cervical disc disorder with radiculopathy of cervical region 10/07/2015   Incomplete rotator cuff tear 09/10/2015   Nonischemic cardiomyopathy ([place]) 05/15/2015   Fatigue 05/15/2015   Dyspnea on exertion 05/15/2015   Decreased cardiac ejection  fraction 04/02/2015   Strain of latissimus dorsi muscle 01/21/2015   Shoulder bursitis 10/12/2014   Spinal stenosis, lumbar region, without neurogenic claudication 10/12/2014   BPPV (benign paroxysmal positional vertigo) 08/14/2014   Hyperlipidemia 01/19/2014   Anemia, unspecified 01/19/2014   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   Type 2 diabetes mellitus with hyperglycemia, without long-term current use of insulin ([place]) 12/22/2013   Hypogonadism in male 12/22/2013   Arthritis of hand, left 12/22/2013    ONSET DATE: DOI 07/19/22  REFERRING DIAG: M25.531 (ICD-10-CM) - Right wrist pain   THERAPY DIAG:  Pain in right wrist  Muscle weakness (generalized)  Stiffness of right wrist, not elsewhere classified  Localized edema  Rationale for Evaluation and Treatment: Rehabilitation  PERTINENT HISTORY: Per MD note: "right wrist pain x 1 day, injured while woodworking. Was turning a clamp and heard/felt a pop. Pt locates pain to dorsal aspect of the wrist. Pain radiating into the arm up to the elbow. Notes, limited ROM, swelling, weakness., numbness in the hand and fingers.Marland KitchenMarland KitchenImpression: Wrist effusion and ECU tenosynovitis " he also has history of chronic back pain and problems. He states grasping a piece of wood tightly and turning his right wrist, feeling and hearing a click of sorts and having  pain and swelling afterwards.  He saw his sports med doctor the next day and now is present today.  He states having some pain at rest in the right wrist and increased pain with motion and weightbearing.  He states having CuTR in 2020 with chronic paresthesias to the small finger, but now has increased paresthesia to ring finger as well since injury.  He enjoys pickleballing and states having a training camp coming up in less than 2 weeks in [place], which he hopes he is able to attend and participate in.  PRECAUTIONS: None; WEIGHT BEARING RESTRICTIONS: Yes limit WB in Rt  wrist/hand for 3-4 weeks, or as tolerated.    SUBJECTIVE:   SUBJECTIVE STATEMENT:  He states that the exercises are going amazingly well and his pain and swelling are much decreased.   PAIN:  Are you having pain? Yes: NPRS scale: 1-2 /10 at rest and when pushing on wrist up to 4-5/10 Pain location: ECU are dorsal ulnar wrist and SF, also increased ulnar numbness Pain description: numb, tingling  Aggravating factors: weight bearing/end-range motion Relieving factors: rest in brace   PATIENT GOALS: To get back to full strength and mobility in right wrist to play pickleball   OBJECTIVE: (All objective assessments below are from initial evaluation on: 07/21/22 unless otherwise specified.)   HAND DOMINANCE: Right   ADLs: Overall ADLs: States decreased ability to grab, hold household objects, pain and inability to open containers, perform FMS tasks (manipulate fasteners on clothing), mild to moderate bathing problems as well.   FUNCTIONAL OUTCOME MEASURES: Eval: Quck DASH 70% impairment today  (Higher % Score  =  More Impairment)    UPPER EXTREMITY ROM     Shoulder to Wrist AROM Right eval Left eval Right  07/24/22  Forearm supination 49 75 61  Forearm pronation  89 80   Wrist flexion 53 61 64  Wrist extension 45 55 69  Wrist ulnar deviation 38    Wrist radial deviation 17    (Blank rows = not tested)   Hand AROM Right eval  Full Fist Ability (or Gap to Distal Palmar Crease) Yes but tender  Thumb Opposition  (Kapandji Scale)  5  (Blank rows = not tested)   UPPER EXTREMITY MMT:    Eval:  NT at eval due to recent and still healing injuries. Will be tested when appropriate.   MMT Right TBD  Elbow flexion   Elbow extension   Forearm supination   Forearm pronation   Wrist flexion   Wrist extension   Wrist ulnar deviation   Wrist radial deviation   (Blank rows = not tested)  HAND FUNCTION: Eval: Observed weakness in affected hand.  Grip strength Right: 61 lbs,  Left: 72 lbs   SENSATION: Eval: Light touch intact today, though diminished in Rt hand ulnar nerve distribution     EDEMA:   Eval:  Mildly swollen in Rt wrist today  OBSERVATIONS:   Eval: He does have acute swelling about the right wrist, especially near the ECU and ulnar styloid.  Fortunately he is not very tender to palpation today.  He does have signs of chronic ulnar nerve wasting in intrinsic hand muscles.  Motion and strength are somewhat limited in the right arm and wrist now. No noted instability.  Presents as strain of extensors, specifically ECU in Rt wrist, as well as exacerbation of  chronic ulnar neuritis.    TODAY'S TREATMENT:  07/24/22: Since today his swelling is much down and he is  tolerating active range of motion very well, OT educates on self stretches now and he tolerates manual stretches very well today with no pain.  Additionally due to stiff pain complaints, he is taught hook stretches for fingers of both hands.  Nerve glides and ulnar nerve management is also reviewed, and he can also use rubber band to do the "finger spread" activity to upgrade to hand strengthening for ulnar nerve issues.  He tolerates all these well today and states understanding not to stretch to painful places.  Again he was recommended no heavy or repetitive weightbearing over the weekend.  Exercises - Turn [employer] Facing Up & Down  - 4-6 x daily - 5 reps - Forearm Supination Stretch  - 3-4 x daily - 3 reps - 15 sec hold - Forearm Pronation Stretch  - 3-4 x daily - 3-5 reps - 15 sec hold - Bend and Pull Back Wrist SLOWLY  - 4 x daily - 5 reps - Wrist Flexion Stretch  - 4 x daily - 3-5 reps - 15 sec hold - Wrist Prayer Stretch  - 4 x daily - 3-5 reps - 15 sec hold - "Windshield Wipers"   - 4 x daily - 5 reps - Seated Wrist Radial Deviation Stretch  - 3-4 x daily - 3 reps - 15 hold - Ulnar Nerve Flossing  - 3-4 x daily - 1-2 sets - 5-10 reps - Finger Spreading  - 4-6 x daily - 10-15 reps - Finger  Spread  - 2-3 x daily - 5 reps - Tendon Glides  - 4-6 x daily - 3-5 reps - 2-3 seconds hold - HOOK Stretch  - 4 x daily - 3-5 reps - 15-20 sec hold  PATIENT EDUCATION: Education details: See tx section above for details  Person educated: Patient Education method: Veterinary surgeon, Teach back, Handouts  Education comprehension: States and demonstrates understanding, Additional Education required    HOME EXERCISE PROGRAM: Access Code: [facility] URL: [email or web address] Date: 07/21/2022 Prepared by: Benito Mccreedy   GOALS: Goals reviewed with patient? Yes   SHORT TERM GOALS: (STG required if POC>30 days) Target Date: 08/07/22  Pt will demo/state understanding of initial HEP to improve pain levels and prerequisite motion. Goal status: INITIAL   LONG TERM GOALS: Target Date: 09/04/22  Pt will improve functional ability by decreased impairment per Quick DASH assessment from 70% to 15% or better, for better quality of life. Goal status: INITIAL  2.  Pt will improve A/ROM in Rt wrist flex/ext from 53/45 to at least 60/55, to have prerequisite functional motion for tasks like reach and grasp.  Goal status: INITIAL  4.  Pt will improve strength in Rt wrist ext to at least 4+5 MMT to have increased functional ability to carry out selfcare and higher-level homecare tasks with no difficulty. Goal status: INITIAL  5.  Pt will decrease pain at rest from 6/10 to 1-2/10 to have better sleep and occupational participation in daily roles. Goal status: INITIAL  ASSESSMENT:  CLINICAL IMPRESSION: 07/24/22: He is doing really well so far swelling is down motion is up and is tolerating stretching.  Continue plan of care  Eval: Patient is a 67 y.o. male who was seen today for occupational therapy evaluation for apparent ECU strain, swelling, pain and increased ulnar nerve paresthesia in right hand and wrist.  These things are decreasing his functional ability and quality of life  and he will benefit from outpatient occupational therapy to increase quality of life.  PLAN:  OT FREQUENCY: 1-2x/week  OT DURATION: 6 weeks  PLANNED INTERVENTIONS: self care/ADL training, therapeutic exercise, therapeutic activity, neuromuscular re-education, manual therapy, passive range of motion, splinting, electrical stimulation, ultrasound, iontophoresis, fluidotherapy, compression bandaging, moist heat, cryotherapy, contrast bath, patient/family education, coping strategies training, and Dry needling  CONSULTED AND AGREED WITH PLAN OF CARE: Patient  PLAN FOR NEXT SESSION:  Next week review stretching and follow-up as well upgrade to light isometric strength and concentric strength as tolerated.  Provide final recommendations before his pickleball trip and consider different kinds of bracing if needed.  Benito Mccreedy, OTR/L, CHT 07/24/2022, 12:13 PM

## 2022-07-24 ENCOUNTER — Encounter: Payer: Self-pay | Admitting: Rehabilitative and Restorative Service Providers"

## 2022-07-24 ENCOUNTER — Ambulatory Visit: Payer: Commercial Managed Care - PPO | Admitting: Rehabilitative and Restorative Service Providers"

## 2022-07-24 DIAGNOSIS — M6281 Muscle weakness (generalized): Secondary | ICD-10-CM | POA: Diagnosis not present

## 2022-07-24 DIAGNOSIS — M25631 Stiffness of right wrist, not elsewhere classified: Secondary | ICD-10-CM

## 2022-07-24 DIAGNOSIS — R600 Localized edema: Secondary | ICD-10-CM | POA: Diagnosis not present

## 2022-07-24 DIAGNOSIS — M25531 Pain in right wrist: Secondary | ICD-10-CM | POA: Diagnosis not present

## 2022-07-27 ENCOUNTER — Encounter: Payer: Self-pay | Admitting: Rehabilitative and Restorative Service Providers"

## 2022-07-27 ENCOUNTER — Ambulatory Visit: Payer: Commercial Managed Care - PPO | Admitting: Rehabilitative and Restorative Service Providers"

## 2022-07-27 DIAGNOSIS — M6281 Muscle weakness (generalized): Secondary | ICD-10-CM

## 2022-07-27 DIAGNOSIS — R600 Localized edema: Secondary | ICD-10-CM

## 2022-07-27 DIAGNOSIS — M25531 Pain in right wrist: Secondary | ICD-10-CM | POA: Diagnosis not present

## 2022-07-27 DIAGNOSIS — M25631 Stiffness of right wrist, not elsewhere classified: Secondary | ICD-10-CM | POA: Diagnosis not present

## 2022-07-27 NOTE — Therapy (Signed)
OUTPATIENT OCCUPATIONAL THERAPY TREATMENT NOTE  Patient Name: Reginald Rios MRN: [id number] DOB:04-Dec-1955, 67 y.o., male Today's Date: 07/27/2022  PCP: Riki Sheer, DO REFERRING PROVIDER: Gregor Hams, MD   END OF SESSION:  OT End of Session - 07/27/22 1103     Visit Number 3    Number of Visits 8    Date for OT Re-Evaluation 09/04/22    Authorization Type [employer] employee insurance    OT Start Time [id number]    OT Stop Time 1207    OT Time Calculation (min) 64 min    Equipment Utilized During Treatment compressive wrist strap    Activity Tolerance Patient limited by pain;No increased pain;Patient limited by fatigue;Patient tolerated treatment well    Behavior During Therapy [facility] for tasks assessed/performed               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   BPPV (benign paroxysmal positional vertigo) 08/14/2014   CAD (coronary artery disease)    2/19 PCI/DESx1 to Lcx   Cervical disc disorder with radiculopathy of cervical region 10/07/2015   Cervical radicular pain 01/10/2019   Chronic cough 04/13/2019   Decreased cardiac ejection fraction 04/02/2015   Diabetes mellitus without complication ([place])    Dyspnea on exertion 05/15/2015   Erectile dysfunction 07/11/2019   Exercise-induced asthma    Family history of early CAD 07/05/2017   Fatigue 05/15/2015   Hallux rigidus, right foot 09/15/2021   Heart murmur    Hemarthrosis of right elbow 02/03/2016   Hoarseness 04/13/2019   Hyperlipidemia    family hx of high cholesterol   Hypogonadism in male    Incomplete  rotator cuff tear 09/10/2015   Injected 09/10/2015   Lateral epicondylitis of right elbow 07/28/2019   Low back pain    Low back pain at multiple sites 02/05/2021   Low vitamin D level 04/22/2017   Mild persistent asthma/cough variant asthma 04/13/2019   Myofascial pain syndrome, cervical 07/28/2019   Nonischemic cardiomyopathy ([place]) 05/15/2015   Nontraumatic incomplete tear of right rotator cuff 07/25/2019   Onychomycosis 05/25/2018   Osteoarthritis    Pain in right ankle and joints of right foot 10/05/2016   Palpitations 07/06/2017   Shoulder bursitis 10/12/2014   Injected in 10/12/2014  Repeat 01/21/2015   Small thenar eminence 02/03/2016   Spinal stenosis, lumbar region, without neurogenic claudication 10/12/2014   Spondylolisthesis at L5-S1 level 05/09/2021   Strain of latissimus dorsi muscle 01/21/2015   Tendinopathy of right biceps tendon 07/25/2019   Type 2 diabetes mellitus with hyperglycemia, without long-term current use of insulin ([place]) 12/22/2013   Ulnar neuropathy at elbow of right upper extremity 01/03/2019   Past Surgical History:  Procedure Laterality Date   CORONARY STENT INTERVENTION N/A 06/23/2017   Procedure: CORONARY STENT INTERVENTION;  Surgeon: Leonie Man, MD;  Location: [place] [facility];  Service: Cardiovascular;  Laterality: N/A;   INTRAVASCULAR PRESSURE WIRE/FFR STUDY N/A 06/23/2017   Procedure: INTRAVASCULAR PRESSURE WIRE/FFR STUDY;  Surgeon: Leonie Man, MD;  Location: [place] [facility];  Service: Cardiovascular;  Laterality: N/A;   KNEE ARTHROSCOPY  LEFT HEART CATH AND CORONARY ANGIOGRAPHY N/A 06/23/2017   Procedure: LEFT HEART CATH AND CORONARY ANGIOGRAPHY;  Surgeon: Leonie Man, MD;  Location: [place] [facility];  Service: Cardiovascular;  Laterality: N/A;   SHOULDER ARTHROSCOPY WITH BICEPSTENOTOMY Right 09/06/2019   Procedure: SHOULDER ARTHROSCOPY WITH BICEPSTENOTOMY;  Surgeon: Leandrew Koyanagi, MD;  Location: [place];   Service: Orthopedics;  Laterality: Right;   SHOULDER ARTHROSCOPY WITH SUBACROMIAL DECOMPRESSION Right 09/06/2019   Procedure: RIGHT SHOULDER ARTHROSCOPY WITH EXTENSIVE DEBRIDEMENT, SUBACROMIAL DECOMPRESSION, BICEPS TENOTOMY;  Surgeon: Leandrew Koyanagi, MD;  Location: [place];  Service: Orthopedics;  Laterality: Right;   TENOTOMY ACHILLES TENDON     Patient Active Problem List   Diagnosis Date Noted   Hallux rigidus, right foot 09/15/2021   Spondylolisthesis at L5-S1 level 05/09/2021   Low back pain at multiple sites 02/05/2021   Osteoarthritis    Heart murmur    Exercise-induced asthma    Diabetes mellitus without complication (HCC)    Arthritis    Anemia    Myofascial pain syndrome, cervical 07/28/2019   Lateral epicondylitis of right elbow 07/28/2019   Nontraumatic incomplete tear of right rotator cuff 07/25/2019   Tendinopathy of right biceps tendon 07/25/2019   Erectile dysfunction 07/11/2019   Mild persistent asthma/cough variant asthma 04/13/2019   Allergic rhinitis with a possible nonallergic component 04/13/2019   Acid reflux 04/13/2019   Chronic cough 04/13/2019   Hoarseness 04/13/2019   Cervical radicular pain 01/10/2019   Ulnar neuropathy at elbow of right upper extremity 01/03/2019   Acute bilateral low back pain without sciatica 01/03/2019   Onychomycosis 05/25/2018   Palpitations 07/06/2017   CAD (coronary artery disease) 07/05/2017   Family history of early CAD 07/05/2017   Abnormal findings on diagnostic imaging of cardiovascular system 06/23/2017   Low vitamin D level 04/22/2017   Pain in right ankle and joints of right foot 10/05/2016   Hemarthrosis of right elbow 02/03/2016   Small thenar eminence 02/03/2016   Cervical disc disorder with radiculopathy of cervical region 10/07/2015   Incomplete rotator cuff tear 09/10/2015   Nonischemic cardiomyopathy ([place]) 05/15/2015   Fatigue 05/15/2015   Dyspnea on exertion 05/15/2015   Decreased cardiac  ejection fraction 04/02/2015   Strain of latissimus dorsi muscle 01/21/2015   Shoulder bursitis 10/12/2014   Spinal stenosis, lumbar region, without neurogenic claudication 10/12/2014   BPPV (benign paroxysmal positional vertigo) 08/14/2014   Hyperlipidemia 01/19/2014   Anemia, unspecified 01/19/2014   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   Type 2 diabetes mellitus with hyperglycemia, without long-term current use of insulin ([place]) 12/22/2013   Hypogonadism in male 12/22/2013   Arthritis of hand, left 12/22/2013    ONSET DATE: DOI 07/19/22  REFERRING DIAG: M25.531 (ICD-10-CM) - Right wrist pain   THERAPY DIAG:  Pain in right wrist  Muscle weakness (generalized)  Localized edema  Stiffness of right wrist, not elsewhere classified  Rationale for Evaluation and Treatment: Rehabilitation  PERTINENT HISTORY: Per MD note: "right wrist pain x 1 day, injured while woodworking. Was turning a clamp and heard/felt a pop. Pt locates pain to dorsal aspect of the wrist. Pain radiating into the arm up to the elbow. Notes, limited ROM, swelling, weakness., numbness in the hand and fingers.Marland KitchenMarland KitchenImpression: Wrist effusion and ECU tenosynovitis " he also has history of chronic back pain and problems. He states grasping a piece of wood tightly and turning his right wrist, feeling and hearing a click of sorts and having  pain and swelling afterwards.  He saw his sports med doctor the next day and now is present today.  He states having some pain at rest in the right wrist and increased pain with motion and weightbearing.  He states having CuTR in 2020 with chronic paresthesias to the small finger, but now has increased paresthesia to ring finger as well since injury.  He enjoys pickleballing and states having a training camp coming up in less than 2 weeks in [place], which he hopes he is able to attend and participate in.  PRECAUTIONS: None; WEIGHT BEARING RESTRICTIONS: Yes limit WB  in Rt wrist/hand for 3-4 weeks, or as tolerated.    SUBJECTIVE:   SUBJECTIVE STATEMENT:  He states doing better yet, mowing lawn, doing more, but also having increased pain from wearing compressive wrist strap tightly all night (he was edu to wear in day with activities only)    PAIN:  Are you having pain?  Yes: NPRS scale: 6/10 at rest a now in  Pain location: knuckles of Rt hand MCP Js  Pain description: numb, tingling  Aggravating factors: weight bearing/end-range motion Relieving factors: rest in brace   PATIENT GOALS: To get back to full strength and mobility in right wrist to play pickleball   OBJECTIVE: (All objective assessments below are from initial evaluation on: 07/21/22 unless otherwise specified.)   HAND DOMINANCE: Right   ADLs: Overall ADLs: States decreased ability to grab, hold household objects, pain and inability to open containers, perform FMS tasks (manipulate fasteners on clothing), mild to moderate bathing problems as well.   FUNCTIONAL OUTCOME MEASURES: Eval: Quck DASH 70% impairment today  (Higher % Score  =  More Impairment)    UPPER EXTREMITY ROM     Shoulder to Wrist AROM Right eval Left eval Right  07/24/22 Rt 07/30/22  Forearm supination 49 75 61 TBD  Forearm pronation  89 80    Wrist flexion 53 61 64   Wrist extension 45 55 69   Wrist ulnar deviation 38     Wrist radial deviation 17     (Blank rows = not tested)   Hand AROM Right eval  Full Fist Ability (or Gap to Distal Palmar Crease) Yes but tender  Thumb Opposition  (Kapandji Scale)  5  (Blank rows = not tested)   UPPER EXTREMITY MMT:     MMT Right 07/27/22  Wrist flexion 5/5  Wrist extension 4+/5  SF & RF extension 4/5 tender  (Blank rows = not tested)  HAND FUNCTION: Eval: Observed weakness in affected hand.  Grip strength Right: 61 lbs, Left: 72 lbs   SENSATION: Eval: Light touch intact today, though diminished in Rt hand ulnar nerve distribution     EDEMA:    Eval:  Mildly swollen in Rt wrist today  OBSERVATIONS:   07/27/22: He has now largely nontender in the ECU, but still has mild weakness and soreness with resisted small finger and ring finger extension.  Most of his complaints seem to be ulnar nerve related and more chronic in nature at this point (also some MCP J OA redness, tenderness; as well as chronic elbow stiffness)   TODAY'S TREATMENT:  07/27/22: Today he comes in with increased nerve pain complaints but no motion related pain to the ECU or wrist.  OT quickly checks his manual muscle strength at the wrist which is good but still mildly tender and weak with small finger extension.  He definitely seems ready to upgrade his program to light  strengthening now.  OT reviews/upgrades his home exercise program with him in detail, answering periodic questions throughout. This includes new bicep and tricep stretches as well as new isometric wrist strengthening.  He demonstrates these back with OT direct supervision and teaching, and does well today with no increase in pain.  He was advised to continue on the nerve gliding program to help with chronic paresthesia and weakness in the hand.  He was also supplied with a circumferential compressive wrist strap that he could wear with resistive activities like mowing the lawn, pushing and pulling, pickleball etc. He can wean from pre-fab wrist brace as tolerated. He states feeling less pain at end of session. He also tolerates swinging a yellow flexbar in all planes of motion with no increased pain with wrist strap on (simulate IADL activity).   Exercises - Doorway Stretches (both arms low, lean in gently)   - 3-4 x daily - 3-5 reps - 15 hold - Tricep Stretch- DO SEATED BY TABLE  - 3-4 x daily - 3-5 reps - 15 hold - Forearm Supination Stretch  - 3-4 x daily - 3 reps - 15 sec hold - Wrist Flexion Stretch  - 4 x daily - 3-5 reps - 15 sec hold - Wrist Prayer Stretch  - 4 x daily - 3-5 reps - 15 sec hold -  Seated Wrist Radial Deviation Stretch  - 3-4 x daily - 3 reps - 15 hold - Isometric Wrist Extension Neutral  - 2-3 x daily - 5-10 reps - 5 sec hold - Ulnar Nerve Flossing  - 3-4 x daily - 1-2 sets - 5-10 reps - Finger Spreading  - 4-6 x daily - 10-15 reps - Finger Spread  - 2-3 x daily - 5 reps - Tendon Glides  - 4-6 x daily - 3-5 reps - 2-3 seconds hold - HOOK Stretch  - 4 x daily - 3-5 reps - 15-20 sec hold  PATIENT EDUCATION: Education details: See tx section above for details  Person educated: Patient Education method: Veterinary surgeon, Teach back, Handouts  Education comprehension: States and demonstrates understanding, Additional Education required    HOME EXERCISE PROGRAM: Access Code: [facility] URL: [email or web address]   GOALS: Goals reviewed with patient? Yes   SHORT TERM GOALS: (STG required if POC>30 days) Target Date: 08/07/22  Pt will demo/state understanding of initial HEP to improve pain levels and prerequisite motion. Goal status: 07/27/22: MET   LONG TERM GOALS: Target Date: 09/04/22  Pt will improve functional ability by decreased impairment per Quick DASH assessment from 70% to 15% or better, for better quality of life. Goal status: INITIAL  2.  Pt will improve A/ROM in Rt wrist flex/ext from 53/45 to at least 60/55, to have prerequisite functional motion for tasks like reach and grasp.  Goal status: INITIAL  4.  Pt will improve strength in Rt wrist ext to at least 4+5 MMT to have increased functional ability to carry out selfcare and higher-level homecare tasks with no difficulty. Goal status: INITIAL  5.  Pt will decrease pain at rest from 6/10 to 1-2/10 to have better sleep and occupational participation in daily roles. Goal status: INITIAL  ASSESSMENT:  CLINICAL IMPRESSION: 07/27/22: Continue to upgrade stretches and resistance with exercises as tolerated working towards pickleball and other complaints.  Nerves are slowly improving  and this is a secondary goal of therapy.  Continue plan of care and Thursday will be his last visit before his pickleball training vacation.  07/24/22: He is  doing really well so far swelling is down motion is up and is tolerating stretching.  Continue plan of care    PLAN:  OT FREQUENCY: 1-2x/week  OT DURATION: 6 weeks  PLANNED INTERVENTIONS: self care/ADL training, therapeutic exercise, therapeutic activity, neuromuscular re-education, manual therapy, passive range of motion, splinting, electrical stimulation, ultrasound, iontophoresis, fluidotherapy, compression bandaging, moist heat, cryotherapy, contrast bath, patient/family education, coping strategies training, and Dry needling  CONSULTED AND AGREED WITH PLAN OF CARE: Patient  PLAN FOR NEXT SESSION:  Check strength again and tolerance to full arm resistance.  Add bicep and tricep strengthening as tolerated as well as dynamic stabilization exercises at the wrist as well as concentric wrist training as long as it is well-tolerated. Use K-tape to help support, if helpful   Benito Mccreedy, OTR/L, CHT 07/27/2022, 12:22 PM

## 2022-07-28 NOTE — Therapy (Addendum)
OUTPATIENT OCCUPATIONAL THERAPY TREATMENT & DISCHARGE NOTE  Patient Name: Reginald Rios MRN: [id number] DOB:May 07, 1955, 67 y.o., male Today's Date: 07/30/2022  PCP: Arva Chafe, DO REFERRING PROVIDER: Rodolph Bong, MD     OCCUPATIONAL THERAPY DISCHARGE SUMMARY  Visits from Start of Care: 4  Please see last visit note for details. He did not return for scheduled visit, but he was doing very well and was likely going to discharge that visit. No contact since, assumes he met all goals, is doing great.   Fannie Knee, OTR/L, CHT 10/22/22     END OF SESSION:  OT End of Session - 07/30/22 1050     Visit Number 4    Number of Visits 8    Date for OT Re-Evaluation 09/04/22    Authorization Type [employer] insurance    OT Start Time 1053    OT Stop Time 1153    OT Time Calculation (min) 60 min    Activity Tolerance No increased pain;Patient limited by fatigue;Patient tolerated treatment well    Behavior During Therapy [facility] for tasks assessed/performed              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   BPPV (benign paroxysmal positional vertigo) 08/14/2014   CAD (coronary artery disease)    2/19 PCI/DESx1 to Lcx   Cervical disc disorder with radiculopathy of cervical region 10/07/2015   Cervical radicular pain 01/10/2019   Chronic cough 04/13/2019   Decreased cardiac ejection fraction 04/02/2015   Diabetes mellitus without complication (HCC)    Dyspnea on exertion 05/15/2015   Erectile dysfunction 07/11/2019   Exercise-induced asthma    Family history  of early CAD 07/05/2017   Fatigue 05/15/2015   Hallux rigidus, right foot 09/15/2021   Heart murmur    Hemarthrosis of right elbow 02/03/2016   Hoarseness 04/13/2019   Hyperlipidemia    family hx of high cholesterol   Hypogonadism in male    Incomplete rotator cuff tear 09/10/2015   Injected 09/10/2015   Lateral epicondylitis of right elbow 07/28/2019   Low back pain    Low back pain at multiple sites 02/05/2021   Low vitamin D level 04/22/2017   Mild persistent asthma/cough variant asthma 04/13/2019   Myofascial pain syndrome, cervical 07/28/2019   Nonischemic cardiomyopathy (HCC) 05/15/2015   Nontraumatic incomplete tear of right rotator cuff 07/25/2019   Onychomycosis 05/25/2018   Osteoarthritis    Pain in right ankle and joints of right foot 10/05/2016   Palpitations 07/06/2017   Shoulder bursitis 10/12/2014   Injected in 10/12/2014  Repeat 01/21/2015   Small thenar eminence 02/03/2016   Spinal stenosis, lumbar region, without neurogenic claudication 10/12/2014   Spondylolisthesis at L5-S1 level 05/09/2021   Strain of latissimus dorsi muscle 01/21/2015   Tendinopathy of right biceps tendon 07/25/2019   Type 2 diabetes mellitus with hyperglycemia, without long-term current use of insulin (HCC) 12/22/2013   Ulnar neuropathy at elbow of right upper extremity 01/03/2019   Past Surgical History:  Procedure Laterality Date   CORONARY STENT INTERVENTION N/A 06/23/2017   Procedure: CORONARY STENT INTERVENTION;  Surgeon: Bryan Lemma  W, MD;  Location: [facility] INVASIVE CV LAB;  Service: Cardiovascular;  Laterality: N/A;   INTRAVASCULAR PRESSURE WIRE/FFR STUDY N/A 06/23/2017   Procedure: INTRAVASCULAR PRESSURE WIRE/FFR STUDY;  Surgeon: Marykay Lex, MD;  Location: [facility] INVASIVE CV LAB;  Service: Cardiovascular;  Laterality: N/A;   KNEE ARTHROSCOPY     LEFT HEART CATH AND CORONARY ANGIOGRAPHY N/A 06/23/2017   Procedure: LEFT HEART CATH AND CORONARY ANGIOGRAPHY;  Surgeon: Marykay Lex, MD;  Location: [facility] INVASIVE CV LAB;  Service: Cardiovascular;  Laterality: N/A;   SHOULDER ARTHROSCOPY WITH BICEPSTENOTOMY Right 09/06/2019   Procedure: SHOULDER ARTHROSCOPY WITH BICEPSTENOTOMY;  Surgeon: Tarry Kos, MD;  Location: [facility];  Service: Orthopedics;  Laterality: Right;   SHOULDER ARTHROSCOPY WITH SUBACROMIAL DECOMPRESSION Right 09/06/2019   Procedure: RIGHT SHOULDER ARTHROSCOPY WITH EXTENSIVE DEBRIDEMENT, SUBACROMIAL DECOMPRESSION, BICEPS TENOTOMY;  Surgeon: Tarry Kos, MD;  Location: [facility];  Service: Orthopedics;  Laterality: Right;   TENOTOMY ACHILLES TENDON     Patient Active Problem List   Diagnosis Date Noted   Hallux rigidus, right foot 09/15/2021   Spondylolisthesis at L5-S1 level 05/09/2021   Low back pain at multiple sites 02/05/2021   Osteoarthritis    Heart murmur    Exercise-induced asthma    Diabetes mellitus without complication (HCC)    Arthritis    Anemia    Myofascial pain syndrome, cervical 07/28/2019   Lateral epicondylitis of right elbow 07/28/2019   Nontraumatic incomplete tear of right rotator cuff 07/25/2019   Tendinopathy of right biceps tendon 07/25/2019   Erectile dysfunction 07/11/2019   Mild persistent asthma/cough variant asthma 04/13/2019   Allergic rhinitis with a possible nonallergic component 04/13/2019   Acid reflux 04/13/2019   Chronic cough 04/13/2019   Hoarseness 04/13/2019   Cervical radicular pain 01/10/2019   Ulnar neuropathy at elbow of right upper extremity 01/03/2019   Acute bilateral low back pain without sciatica 01/03/2019   Onychomycosis 05/25/2018   Palpitations 07/06/2017   CAD (coronary artery disease) 07/05/2017   Family history of early CAD 07/05/2017   Abnormal findings on diagnostic imaging of cardiovascular system 06/23/2017   Low vitamin D level 04/22/2017   Pain in right ankle and joints of right foot 10/05/2016   Hemarthrosis of right elbow 02/03/2016   Small thenar  eminence 02/03/2016   Cervical disc disorder with radiculopathy of cervical region 10/07/2015   Incomplete rotator cuff tear 09/10/2015   Nonischemic cardiomyopathy (HCC) 05/15/2015   Fatigue 05/15/2015   Dyspnea on exertion 05/15/2015   Decreased cardiac ejection fraction 04/02/2015   Strain of latissimus dorsi muscle 01/21/2015   Shoulder bursitis 10/12/2014   Spinal stenosis, lumbar region, without neurogenic claudication 10/12/2014   BPPV (benign paroxysmal positional vertigo) 08/14/2014   Hyperlipidemia 01/19/2014   Anemia, unspecified 01/19/2014   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   Type 2 diabetes mellitus with hyperglycemia, without long-term current use of insulin (HCC) 12/22/2013   Hypogonadism in male 12/22/2013   Arthritis of hand, left 12/22/2013    ONSET DATE: DOI 07/19/22  REFERRING DIAG: M25.531 (ICD-10-CM) - Right wrist pain   THERAPY DIAG:  Muscle weakness (generalized)  Pain in right wrist  Stiffness of right wrist, not elsewhere classified  Rationale for Evaluation and Treatment: Rehabilitation  PERTINENT HISTORY: Per MD note: "right wrist pain x 1 day, injured while woodworking. Was turning a clamp and heard/felt a pop. Pt locates pain to dorsal aspect of the wrist. Pain radiating into the arm up  to the elbow. Notes, limited ROM, swelling, weakness., numbness in the hand and fingers.Marland KitchenMarland KitchenImpression: Wrist effusion and ECU tenosynovitis " he also has history of chronic back pain and problems. He states grasping a piece of wood tightly and turning his right wrist, feeling and hearing a click of sorts and having pain and swelling afterwards.  He saw his sports med doctor the next day and now is present today.  He states having some pain at rest in the right wrist and increased pain with motion and weightbearing.  He states having CuTR in 2020 with chronic paresthesias to the small finger, but now has increased paresthesia to ring finger  as well since injury.  He enjoys pickleballing and states having a training camp coming up in less than 2 weeks in [place], which he hopes he is able to attend and participate in.  PRECAUTIONS: None; WEIGHT BEARING RESTRICTIONS: Yes limit WB in Rt wrist/hand for 3-4 weeks, or as tolerated.    SUBJECTIVE:   SUBJECTIVE STATEMENT:  He states he played pickleball cautiously, wore wrist strap for protection is not in much pain now.  He requests a shoulder home program that he can perform before pickleball matches as well as his comprehensive program.   doing better yet, mowing lawn, doing more, but also having increased pain from wearing compressive wrist strap tightly all night (he was edu to wear in day with activities only)    PAIN:  Are you having pain?  Yes: NPRS scale: 1-2/10 at rest a now Pain location: in wrist dorsally, very mild to no pain now Pain description: numb, tingling  Aggravating factors: weight bearing/end-range motion Relieving factors: rest in brace   PATIENT GOALS: To get back to full strength and mobility in right wrist to play pickleball   OBJECTIVE: (All objective assessments below are from initial evaluation on: 07/21/22 unless otherwise specified.)   HAND DOMINANCE: Right   ADLs: Overall ADLs: States decreased ability to grab, hold household objects, pain and inability to open containers, perform FMS tasks (manipulate fasteners on clothing), mild to moderate bathing problems as well.   FUNCTIONAL OUTCOME MEASURES: Eval: Quck DASH 70% impairment today  (Higher % Score  =  More Impairment)    UPPER EXTREMITY ROM     Shoulder to Wrist AROM Right eval Left eval Right  07/24/22 Rt 07/30/22  Forearm supination 49 75 61 53  Forearm pronation  89 80    Wrist flexion 53 61 64 61  Wrist extension 45 55 69 55  Wrist ulnar deviation 38     Wrist radial deviation 17     (Blank rows = not tested)   Hand AROM Right eval  Full Fist Ability (or Gap to Distal  Palmar Crease) Yes but tender  Thumb Opposition  (Kapandji Scale)  5  (Blank rows = not tested)   UPPER EXTREMITY MMT:     MMT Right 07/27/22 Right 07/30/22  Wrist flexion 5/5   Wrist extension 4+/5 5/5  SF & RF extension 4/5 tender 5/5  (Blank rows = not tested)  HAND FUNCTION: Eval: Observed weakness in affected hand.  Grip strength Right: 61 lbs, Left: 72 lbs   SENSATION: Eval: Light touch intact today, though diminished in Rt hand ulnar nerve distribution     EDEMA:   Eval:  Mildly swollen in Rt wrist today  OBSERVATIONS:   07/27/22: He has now largely nontender in the ECU, but still has mild weakness and soreness with resisted small finger and ring  finger extension.  Most of his complaints seem to be ulnar nerve related and more chronic in nature at this point (also some MCP J OA redness, tenderness; as well as chronic elbow stiffness)   TODAY'S TREATMENT:  07/30/22: As he requests a shorter program to do just before matches, OT supplies him with this educating on the differences between maintenance and prevention be with before match routine versus his comprehensive routine to help alleviate his tendinitis/stiffness and pain in his chronic nerve symptoms.  He still gets somewhat confused by these differences.  Also he gets confused by the videos in the program versus how the therapist described to do the exercises.  This is discussed in detail today with multiple examples. (Reviews putty strength activities, as well as nerve gliding, and exercises.) OT also takes copious notes on paper to help him understand.  Next he performs active range of motion showing tighter now and was cautioned to keep consistent with stretches especially as he returns to activities or his tightness will return and his pain may return.  Lastly he was upgraded to new strengthening concentrically and wrist extension trialing 2 and 3 pounds today with no added pain and also at the forearm with "hammer  exercises" and pro/supination as well as wrist dart throwers motions.  He tolerates these well stating feeling some tensions and fatigue but no pain.  He is advised to do these only after warming up and stretching.  He is advised again for his safety to wear wrist strap during his matches next week, to warm up and stretch before playing, use modalities like ice afterwards if he is very sore or swollen or painful.  He states understanding though appears minimally confused about some of the details still.   PATIENT EDUCATION: Education details: See tx section above for details  Person educated: Patient Education method: Verbal Instruction, Teach back, Handouts  Education comprehension: States and demonstrates understanding, Additional Education required    HOME EXERCISE PROGRAM: 07/30/22: 2 programs provided today:   Access Code: [email or web address] (BIG PROGRAM) URL: [email or web address]  Access Code: [email or web address] (Pre- Game) URL: [email or web address]    GOALS: Goals reviewed with patient? Yes   SHORT TERM GOALS: (STG required if POC>30 days) Target Date: 08/07/22  Pt will demo/state understanding of initial HEP to improve pain levels and prerequisite motion. Goal status: 07/27/22: MET   LONG TERM GOALS: Target Date: 09/04/22  Pt will improve functional ability by decreased impairment per Quick DASH assessment from 70% to 15% or better, for better quality of life. Goal status: INITIAL  2.  Pt will improve A/ROM in Rt wrist flex/ext from 53/45 to at least 60/55, to have prerequisite functional motion for tasks like reach and grasp.  Goal status: INITIAL  4.  Pt will improve strength in Rt wrist ext to at least 4+5 MMT to have increased functional ability to carry out selfcare and higher-level homecare tasks with no difficulty. Goal status: INITIAL  5.  Pt will decrease pain at rest from 6/10 to 1-2/10 to have better sleep and occupational participation in daily  roles. Goal status: INITIAL  ASSESSMENT:  CLINICAL IMPRESSION: 07/30/22: Check on him when he gets back from vacation to see if he tolerated his pickleball matches well without exacerbation.  If so, and if he tolerates strengthening in the clinic and has no further problems or questions he will be fit for discharge.    PLAN:  OT FREQUENCY: 1-2x/week  OT DURATION: 6 weeks  PLANNED INTERVENTIONS: self care/ADL training,  therapeutic exercise, therapeutic activity, neuromuscular re-education, manual therapy, passive range of motion, splinting, electrical stimulation, ultrasound, iontophoresis, fluidotherapy, compression bandaging, moist heat, cryotherapy, contrast bath, patient/family education, coping strategies training, and Dry needling  CONSULTED AND AGREED WITH PLAN OF CARE: Patient  PLAN FOR NEXT SESSION:  Determine his status after his trip and pickleball training sessions.  Consider progress note and discharge if all goals are met.  Fannie Knee, OTR/L, CHT 07/30/2022, 1:31 PM

## 2022-07-29 ENCOUNTER — Other Ambulatory Visit: Payer: Self-pay | Admitting: Cardiology

## 2022-07-30 ENCOUNTER — Ambulatory Visit: Payer: Commercial Managed Care - PPO | Admitting: Rehabilitative and Restorative Service Providers"

## 2022-07-30 ENCOUNTER — Other Ambulatory Visit: Payer: Self-pay

## 2022-07-30 ENCOUNTER — Encounter: Payer: Self-pay | Admitting: Rehabilitative and Restorative Service Providers"

## 2022-07-30 ENCOUNTER — Other Ambulatory Visit (HOSPITAL_BASED_OUTPATIENT_CLINIC_OR_DEPARTMENT_OTHER): Payer: Self-pay

## 2022-07-30 DIAGNOSIS — M25531 Pain in right wrist: Secondary | ICD-10-CM

## 2022-07-30 DIAGNOSIS — M25631 Stiffness of right wrist, not elsewhere classified: Secondary | ICD-10-CM

## 2022-07-30 DIAGNOSIS — M6281 Muscle weakness (generalized): Secondary | ICD-10-CM

## 2022-07-30 MED ORDER — LOSARTAN POTASSIUM 25 MG PO TABS
25.0000 mg | ORAL_TABLET | Freq: Every day | ORAL | 1 refills | Status: DC
  Filled 2022-07-30: qty 90, 90d supply, fill #0
  Filled 2022-11-20: qty 90, 90d supply, fill #1

## 2022-08-10 ENCOUNTER — Other Ambulatory Visit: Payer: Self-pay | Admitting: Family Medicine

## 2022-08-10 ENCOUNTER — Other Ambulatory Visit: Payer: Self-pay | Admitting: Cardiology

## 2022-08-11 ENCOUNTER — Other Ambulatory Visit (HOSPITAL_BASED_OUTPATIENT_CLINIC_OR_DEPARTMENT_OTHER): Payer: Self-pay

## 2022-08-11 ENCOUNTER — Other Ambulatory Visit: Payer: Self-pay

## 2022-08-11 MED ORDER — METHYLPHENIDATE HCL 20 MG PO TABS
20.0000 mg | ORAL_TABLET | Freq: Two times a day (BID) | ORAL | 0 refills | Status: DC
  Filled 2022-08-11: qty 60, 30d supply, fill #0

## 2022-08-11 MED ORDER — METHYLPHENIDATE HCL 20 MG PO TABS: 20.0000 mg | ORAL_TABLET | Freq: Two times a day (BID) | ORAL | 0 refills | Status: DC

## 2022-08-11 MED ORDER — METHYLPHENIDATE HCL 20 MG PO TABS
20.0000 mg | ORAL_TABLET | Freq: Two times a day (BID) | ORAL | 0 refills | Status: DC
  Filled 2022-10-28: qty 60, 30d supply, fill #0

## 2022-08-11 MED ORDER — EZETIMIBE 10 MG PO TABS
10.0000 mg | ORAL_TABLET | Freq: Every day | ORAL | 1 refills | Status: DC
  Filled 2022-08-11: qty 90, 90d supply, fill #0
  Filled 2022-11-20: qty 90, 90d supply, fill #1

## 2022-08-14 ENCOUNTER — Other Ambulatory Visit (HOSPITAL_BASED_OUTPATIENT_CLINIC_OR_DEPARTMENT_OTHER): Payer: Self-pay

## 2022-08-17 ENCOUNTER — Encounter: Payer: Self-pay | Admitting: *Deleted

## 2022-08-20 ENCOUNTER — Ambulatory Visit (INDEPENDENT_AMBULATORY_CARE_PROVIDER_SITE_OTHER): Payer: Commercial Managed Care - PPO

## 2022-08-20 ENCOUNTER — Ambulatory Visit: Payer: Commercial Managed Care - PPO | Admitting: Family Medicine

## 2022-08-20 VITALS — BP 124/68 | HR 86 | Ht 70.0 in | Wt 196.0 lb

## 2022-08-20 DIAGNOSIS — M5416 Radiculopathy, lumbar region: Secondary | ICD-10-CM

## 2022-08-20 DIAGNOSIS — M545 Low back pain, unspecified: Secondary | ICD-10-CM | POA: Diagnosis not present

## 2022-08-20 DIAGNOSIS — M2021 Hallux rigidus, right foot: Secondary | ICD-10-CM

## 2022-08-20 NOTE — Progress Notes (Signed)
Rubin Payor, PhD, LAT, ATC acting as a scribe for [name], MD.  Reginald Rios is a 67 y.o. male who presents to [facility] at [facility] today for 5-month f/u R wrist pain thought to be due to irritation of ECU tendon with possible snap over ulnar styloid and bilat knee pain. Pt was last seen by Dr. [name] on 07/20/22 and was given a L knee steroid injection and was advised to immobilize the wrist and was referred to OT, completing 4 visits.   Today, pt reports R wrist pain is completed resolved.  Pt would like to discuss the pain in his R Great toe. Pt was seen last for this issue on 10/16/21 and was given a R Great toe IP medial-dorsal steroid injection. He was also seen by Podiatry for this issue on 12/28/23He notes that he walks around his house barefoot, which exacerbates his toe pain. He is doing yoga for his back, but this also irritated his toe.  He notes his toe hurts worse when he attempts to dorsiflex the toe.  Previously recommended turf toe insoles.  He did purchase a steel turf toe insole but it was too big for his shoe and cut the toe end of the insole off to make it fit better.  This did not help his foot pain very much.  Additionally he notes pain in his back radiating down his right leg to the lateral calf.  This worsened after he started playing pickle ball on a hard court.  He denies any weakness.  Dx imaging: 07/20/22 R & L knee and R wrist XR  Pertinent review of systems: No fevers or chills  Relevant historical information: Diabetes   Exam:  BP 124/68   Pulse 86   Ht  (1.778 m)   Wt 196 lb (88.9 kg)   SpO2 97%   BMI 28.12 kg/m  General: Well Developed, well nourished, and in no acute distress.   MSK: Right great toe not particularly swollen.  Tender palpation IP joint and MTP.  Decreased range of motion dorsiflexion and plantarflexion MTP joint and especially limited at IP joint.  L-spine nontender palpation spinal midline  decreased lumbar motion. Lower EXTR strength and reflexes are intact distally.  Lab and Radiology Results  X-ray right foot obtained at podiatry on April 30 2022 personally independent interpreted.  Significant degenerative changes at right great toe IP joint.  X-ray images lumbar spine obtained today personally and independently interpreted Multi level DDD significant at L4-5.  No acute fractures are visible. Await formal radiology review   Assessment and Plan: 68 y.o. male with right great toe pain and lack of range of motion due to hallux rigidus.  Patient does have significant degenerative changes at IP joint.  Spent a lot of time talking about turf toe insoles.  Previously he attempted to use a steel turf toe insole but cut the toe end of the insole off to make it fit in his shoe better which reduced her eliminated the functionality of the turf toe insole.  We talked about how to get when they would fit better and use it properly.  We also talked about the potential for carbon fiber toe plate.  Lumbar radiculopathy right L5.  This is an acute exacerbation of a chronic problem.  He does have a prior lumbar spine MRI from December 2022 that shows potential for L5 nerve impingement on the right.  Plan for epidural steroid injection.  He  will need to pause his clopidogrel temporarily for about a week prior to the injection.   PDMP not reviewed this encounter. Orders Placed This Encounter  Procedures   DG INJECT DIAG/THERA/INC NEEDLE/CATH/PLC EPI/LUMB/SAC W/IMG    Standing Status:   Future    Standing Expiration Date:   08/20/2023    Order Specific Question:   Reason for Exam (SYMPTOM  OR DIAGNOSIS REQUIRED)    Answer:   Rt L5 symptoms. ESI or NRB or both. Level and technique per radiology    Order Specific Question:   Preferred Imaging Location?    Answer:   [id number]    Order Specific Question:   Radiology Contrast Protocol - do NOT remove file path    Answer:    [email or web address]   DG Lumbar Spine 2-3 Views    Standing Status:   Future    Number of Occurrences:   1    Standing Expiration Date:   08/20/2023    Order Specific Question:   Reason for Exam (SYMPTOM  OR DIAGNOSIS REQUIRED)    Answer:   eval low back pain nd new Rt L5    Order Specific Question:   Preferred imaging location?    Answer:   [facility]   No orders of the defined types were placed in this encounter.    Discussed warning signs or symptoms. Please see discharge instructions. Patient expresses understanding.   The above documentation has been reviewed and is accurate and complete [name], M.D.

## 2022-08-20 NOTE — Patient Instructions (Addendum)
Thank you for coming in today.   Full Steel Insoles Men's 11  Let me know if this is not working.   Please call [facility] at [phone] to schedule your spine injection.    Stop Clopidogrel 1 week prior to back injection.   Please get an Xray today before you leave   Hallux Rigidus  Hallux rigidus is a type of joint pain or joint disease (degenerativearthritis) that affects your big toe (hallux). This condition involves the joint that connects the base of your big toe to the main part of your foot (metatarsophalangeal joint or MTP joint). This condition can cause your big toe to become stiff, painful, and difficult to move. Symptoms may get worse with movement or in cold or damp weather. The condition gets worse over time. What are the causes? This condition may be caused by having a foot that does not function the way that it should or that has an abnormal shape (structural deformity). These foot problems can run in families and may be passed down from parents to children (are hereditary). This condition can also be caused by: Injury. Overuse. What increases the risk? You are more likely to develop this condition if you have: A foot bone (metatarsal) that is longer or higher than normal. A family history of hallux rigidus. Previously injured your big toe. Feet that do not have a curve (arch) on the inner side of the foot. This may be called flat feet or fallen arches. Ankles that turn in when you walk (pronation). Rheumatoid arthritis or gout. A job that requires you to stoop down often at work. What are the signs or symptoms? Symptoms of this condition include: Big toe pain. Stiffness and difficulty moving the big toe. Swelling of the toe and surrounding area. Bone spurs. These are bony growths that can form on the joint of the big toe. A limp. How is this diagnosed? This condition is diagnosed based on your medical history and a physical exam. You may also have  X-rays. How is this treated? This condition is treated by: Wearing roomy, comfortable shoes that have a large toe box. Putting orthotic devices in your shoes. Taking pain medicines. Having physical therapy. Icing the injured area. Alternating between putting your foot in cold water and then in warm water. If your condition is severe, treatment may include: Corticosteroid injections to relieve pain. Surgery to remove bone spurs, fuse damaged bones together, or replace the entire joint. Follow these instructions at home: Managing pain, stiffness, and swelling  Put your feet in cold water for 30 seconds, and then in warm water for 30 seconds. Alternate between the cold and warm water for 5 minutes. Do this several times a day or as told by your health care provider. If directed, put ice on the injured area. Put ice in a plastic bag. Place a towel between your skin and the bag. Leave the ice on for 20 minutes, 2-3 times a day. General instructions Take over-the-counter and prescription medicines only as told by your health care provider. Do not wear high heels or other restrictive footwear. Wear comfortable, supportive shoes that have a large toe box. Wear shoe inserts (orthotics) as told by your health care provider, if this applies. Do foot exercises as instructed by your health care provider or a physical therapist. Keep all follow-up visits as told by your health care provider. This is important. Contact a health care provider if: You notice bone spurs or growths on or around your  big toe. Your pain does not get better or it gets worse. You have pain while resting. You have pain in other parts of your body, such as your back, hip, or knee. You start to limp. Summary Hallux rigidus is a condition that makes your big toe become stiff, painful, and difficult to move. It can be caused by injury, overuse, or inflammatory diseases. This condition may be treated with ice, medicines,  physical therapy, and surgery. Do not wear high heels or other restrictive footwear. Wear comfortable, supportive shoes that have a large toe box. This information is not intended to replace advice given to you by your health care provider. Make sure you discuss any questions you have with your health care provider. Document Revised: 08/29/2020 Document Reviewed: 08/29/2020 Elsevier Patient Education  2023 [employer].

## 2022-08-24 ENCOUNTER — Telehealth: Payer: Self-pay

## 2022-08-24 ENCOUNTER — Encounter: Payer: Self-pay | Admitting: Family Medicine

## 2022-08-24 NOTE — Telephone Encounter (Signed)
   Pre-operative Risk Assessment    Patient Name: Reginald Rios  DOB: [birth year] MRN: [id number]      Request for Surgical Clearance    Procedure:   lumbar epidural  Date of Surgery:  Clearance TBD                                 Surgeon:   Surgeon's Group or Practice Name:  [facility] Phone number:  [phone] Fax number:  [phone]   Type of Clearance Requested:   - Pharmacy:  Hold Clopidogrel (Plavix) 5 days    Type of Anesthesia:  Not Indicated   Additional requests/questions:    Merlene Laughter   08/24/2022, 4:54 PM

## 2022-08-25 ENCOUNTER — Telehealth: Payer: Self-pay | Admitting: Family Medicine

## 2022-08-25 ENCOUNTER — Ambulatory Visit (INDEPENDENT_AMBULATORY_CARE_PROVIDER_SITE_OTHER): Payer: Commercial Managed Care - PPO

## 2022-08-25 ENCOUNTER — Other Ambulatory Visit: Payer: Self-pay | Admitting: Internal Medicine

## 2022-08-25 ENCOUNTER — Other Ambulatory Visit: Payer: Self-pay | Admitting: Family Medicine

## 2022-08-25 DIAGNOSIS — M898X9 Other specified disorders of bone, unspecified site: Secondary | ICD-10-CM | POA: Diagnosis not present

## 2022-08-25 DIAGNOSIS — K219 Gastro-esophageal reflux disease without esophagitis: Secondary | ICD-10-CM

## 2022-08-25 NOTE — Telephone Encounter (Signed)
Called and discussed w/ pt. Advised to return at his convenience to get the hip XR and that the [facility] was already ordered and he could proceed. He stated that he would stop by the office either this afternoon or tomorrow morning for the XR. He verbalized understanding.

## 2022-08-25 NOTE — Telephone Encounter (Signed)
Per Lynden Ang at [facility] anesthesia will be local

## 2022-08-25 NOTE — Telephone Encounter (Signed)
Called patient to ensure no signs or symptoms of ACS that would inhibit holding Plavix as requested for procedure.  Left message for patient to call back.  Levi Aland, NP-C  08/25/2022, 12:42 PM [address], [address] Office [phone] Fax [phone]

## 2022-08-25 NOTE — Progress Notes (Signed)
Lumbar spine x-ray shows significant arthritis changes.  The radiologist would like me to get a hip x-ray to look at the top of the hip bone that they can see on x-ray which looks a little abnormal. Hip x-ray has been ordered.  Please return to clinic for x-ray at your convenience.

## 2022-08-25 NOTE — Telephone Encounter (Signed)
Hip x-ray ordered per radiology recommendation.

## 2022-08-25 NOTE — Telephone Encounter (Signed)
   Primary Cardiologist: Norman Herrlich, MD  Chart reviewed as part of pre-operative protocol coverage. Given past medical history and time since last visit, based on ACC/AHA guidelines, [place] would be at acceptable risk for the planned procedure without further cardiovascular testing.   Called patient to ensure no s/s of ACS prior to agreement to hold Plavix. He verbalized that he remains active with no concerning cardiac symptoms. He was advised that if he develops new symptoms prior to surgery to contact our office to arrange a follow-up appointment. He verbalized understanding.  Per office protocol, he may hold Plavix for 5 days prior to procedure and should resume as soon as hemodynamically stable postoperatively.  I will route this recommendation to the requesting party via Epic fax function and remove from pre-op pool.  Please call with questions.  Levi Aland, NP-C  08/25/2022, 12:58 PM [address], [address] Office [phone] Fax [phone]

## 2022-08-26 ENCOUNTER — Other Ambulatory Visit: Payer: Self-pay

## 2022-08-26 ENCOUNTER — Encounter (HOSPITAL_BASED_OUTPATIENT_CLINIC_OR_DEPARTMENT_OTHER): Payer: Self-pay | Admitting: Pharmacist

## 2022-08-26 ENCOUNTER — Other Ambulatory Visit (HOSPITAL_BASED_OUTPATIENT_CLINIC_OR_DEPARTMENT_OTHER): Payer: Self-pay

## 2022-08-26 MED ORDER — MONTELUKAST SODIUM 10 MG PO TABS
10.0000 mg | ORAL_TABLET | Freq: Every day | ORAL | 3 refills | Status: DC
  Filled 2022-08-26: qty 90, 90d supply, fill #0
  Filled 2022-12-01: qty 90, 90d supply, fill #1
  Filled 2023-03-07: qty 90, 90d supply, fill #2
  Filled 2023-05-28: qty 90, 90d supply, fill #3

## 2022-08-29 ENCOUNTER — Other Ambulatory Visit: Payer: Self-pay | Admitting: Family Medicine

## 2022-08-29 ENCOUNTER — Other Ambulatory Visit: Payer: Self-pay | Admitting: Internal Medicine

## 2022-08-29 ENCOUNTER — Encounter: Payer: Self-pay | Admitting: Family Medicine

## 2022-08-29 DIAGNOSIS — K219 Gastro-esophageal reflux disease without esophagitis: Secondary | ICD-10-CM

## 2022-08-29 DIAGNOSIS — E291 Testicular hypofunction: Secondary | ICD-10-CM

## 2022-08-31 ENCOUNTER — Telehealth: Payer: Self-pay | Admitting: Family Medicine

## 2022-08-31 ENCOUNTER — Encounter (HOSPITAL_BASED_OUTPATIENT_CLINIC_OR_DEPARTMENT_OTHER): Payer: Self-pay | Admitting: Pharmacist

## 2022-08-31 ENCOUNTER — Other Ambulatory Visit: Payer: Self-pay

## 2022-08-31 ENCOUNTER — Other Ambulatory Visit (HOSPITAL_BASED_OUTPATIENT_CLINIC_OR_DEPARTMENT_OTHER): Payer: Self-pay

## 2022-08-31 DIAGNOSIS — M898X9 Other specified disorders of bone, unspecified site: Secondary | ICD-10-CM

## 2022-08-31 MED ORDER — TESTOSTERONE 50 MG/5GM (1%) TD GEL
TRANSDERMAL | 5 refills | Status: DC
Start: 2022-08-31 — End: 2023-03-23
  Filled 2022-08-31: qty 300, 30d supply, fill #0
  Filled 2022-10-11: qty 300, 30d supply, fill #1
  Filled 2022-12-12: qty 300, 30d supply, fill #2
  Filled 2023-02-03: qty 300, 30d supply, fill #3
  Filled 2023-02-07 – 2023-02-08 (×2): qty 150, 15d supply, fill #3

## 2022-08-31 MED ORDER — BACLOFEN 10 MG PO TABS
10.0000 mg | ORAL_TABLET | Freq: Three times a day (TID) | ORAL | 1 refills | Status: DC | PRN
  Filled 2022-08-31: qty 30, 10d supply, fill #0

## 2022-08-31 NOTE — Progress Notes (Signed)
Bilateral hip x-ray shows arthritis both sides left worse than right.  You do have this abnormality seen in the right hip pelvis area that radiology would like me to look at with an MRI.  I will order an MRI to evaluate it more thoroughly.

## 2022-08-31 NOTE — Telephone Encounter (Signed)
Rx refill request approved per Dr. Corey's orders. 

## 2022-08-31 NOTE — Telephone Encounter (Signed)
Forwarding to Dr. Corey to advise.  

## 2022-08-31 NOTE — Telephone Encounter (Signed)
MRI ordered to evaluate sclerotic focus seen on x-ray pelvis.

## 2022-08-31 NOTE — Telephone Encounter (Signed)
Spoke to pt who reports his wife is concerned about sticking a needle into an area where there is a suspicious lesion. She think that this lesion is cancerous. We discussed that the ESI was going into a completely different area of his body, but that if they were concerned then cancel the ESI and r/s after the MRI. Also advised that he would be receiving an injection for the contrast portion of the MRI. He was puzzled and didn't know how to best proceed. He was going to discuss further w/ his wife.

## 2022-08-31 NOTE — Telephone Encounter (Signed)
Patient called somewhat concerned about his recent results and the next steps. He asked is someone could call back to discuss further.

## 2022-09-01 ENCOUNTER — Inpatient Hospital Stay
Admission: RE | Admit: 2022-09-01 | Discharge: 2022-09-01 | Disposition: A | Discharge status: Home / Self Care | Payer: Commercial Managed Care - PPO | Source: Ambulatory Visit | Attending: Family Medicine | Admitting: Family Medicine

## 2022-09-01 NOTE — Discharge Instructions (Signed)

## 2022-09-03 NOTE — Telephone Encounter (Signed)
Patient called asking if the MRI could be changed to [facility]?  He has used them in the past.

## 2022-09-03 NOTE — Telephone Encounter (Signed)
Referral has been updated to [employer]. Colin Mulders, will you double check behind me to make sure everything looks correct in the referral? Thanks!

## 2022-09-06 ENCOUNTER — Other Ambulatory Visit: Payer: Self-pay | Admitting: Internal Medicine

## 2022-09-06 DIAGNOSIS — K219 Gastro-esophageal reflux disease without esophagitis: Secondary | ICD-10-CM

## 2022-09-07 ENCOUNTER — Other Ambulatory Visit (HOSPITAL_BASED_OUTPATIENT_CLINIC_OR_DEPARTMENT_OTHER): Payer: Self-pay

## 2022-09-07 ENCOUNTER — Other Ambulatory Visit: Payer: Self-pay | Admitting: Family Medicine

## 2022-09-07 DIAGNOSIS — K219 Gastro-esophageal reflux disease without esophagitis: Secondary | ICD-10-CM

## 2022-09-07 MED ORDER — PANTOPRAZOLE SODIUM 40 MG PO TBEC
40.0000 mg | DELAYED_RELEASE_TABLET | Freq: Every day | ORAL | 3 refills | Status: DC
Start: 2022-09-07 — End: 2023-01-04
  Filled 2022-09-07: qty 30, 30d supply, fill #0
  Filled 2022-09-26 – 2022-10-11 (×2): qty 30, 30d supply, fill #1
  Filled 2022-11-20: qty 30, 30d supply, fill #2
  Filled 2022-12-12: qty 30, 30d supply, fill #3

## 2022-09-08 NOTE — Addendum Note (Signed)
Addended by: Evon Slack on: 09/08/2022 07:50 AM   Modules accepted: Orders

## 2022-09-14 ENCOUNTER — Ambulatory Visit (INDEPENDENT_AMBULATORY_CARE_PROVIDER_SITE_OTHER): Payer: Commercial Managed Care - PPO

## 2022-09-14 DIAGNOSIS — M898X9 Other specified disorders of bone, unspecified site: Secondary | ICD-10-CM | POA: Diagnosis not present

## 2022-09-14 MED ORDER — GADOBUTROL 1 MMOL/ML IV SOLN
10.0000 mL | Freq: Once | INTRAVENOUS | Status: AC | PRN
Start: 2022-09-14 — End: 2022-09-14
  Administered 2022-09-14: 10 mL via INTRAVENOUS

## 2022-09-17 ENCOUNTER — Encounter: Payer: Self-pay | Admitting: Family Medicine

## 2022-09-17 DIAGNOSIS — E119 Type 2 diabetes mellitus without complications: Secondary | ICD-10-CM | POA: Diagnosis not present

## 2022-09-17 DIAGNOSIS — Z961 Presence of intraocular lens: Secondary | ICD-10-CM | POA: Diagnosis not present

## 2022-09-17 DIAGNOSIS — H35033 Hypertensive retinopathy, bilateral: Secondary | ICD-10-CM | POA: Diagnosis not present

## 2022-09-17 DIAGNOSIS — H26493 Other secondary cataract, bilateral: Secondary | ICD-10-CM | POA: Diagnosis not present

## 2022-09-21 NOTE — Progress Notes (Signed)
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  The bladder is a little thicker than normal.  Recommend return to clinic to go over the results in full detail and talk about treatment plan and options.

## 2022-09-22 ENCOUNTER — Ambulatory Visit (INDEPENDENT_AMBULATORY_CARE_PROVIDER_SITE_OTHER): Payer: Commercial Managed Care - PPO | Admitting: Family Medicine

## 2022-09-22 ENCOUNTER — Encounter: Payer: Self-pay | Admitting: Family Medicine

## 2022-09-22 VITALS — BP 112/72 | HR 101 | Ht 70.0 in | Wt 193.6 lb

## 2022-09-22 DIAGNOSIS — G8929 Other chronic pain: Secondary | ICD-10-CM

## 2022-09-22 DIAGNOSIS — N3289 Other specified disorders of bladder: Secondary | ICD-10-CM

## 2022-09-22 DIAGNOSIS — M545 Low back pain, unspecified: Secondary | ICD-10-CM | POA: Diagnosis not present

## 2022-09-22 DIAGNOSIS — M47816 Spondylosis without myelopathy or radiculopathy, lumbar region: Secondary | ICD-10-CM | POA: Diagnosis not present

## 2022-09-22 NOTE — Patient Instructions (Addendum)
Thank you for coming in today.   Please call [facility] at [phone] to schedule your spine injection.    Let me know how you feel after the shot.   I've referred you to [facility].  Let us know if you don't hear from them in one week.

## 2022-09-22 NOTE — Progress Notes (Signed)
amb        I, Stevenson Clinch, CMA acting as a scribe for Reginald Graham, MD.  Reginald Rios is a 67 y.o. male who presents to [facility] at [facility] today for pelvis MRI review. Pt was last seen by Dr. [name] on 08/20/22 and a new L-spine XR was obtained. Based on XR findings a hip/pelvis XR was also obtained and then further evaluated w/ a MRI. Pt has not yet scheduled ESI. Today, pt reports continued pain, has stopped playing [employer] d/t pain.   Pain is located primarily in the right low back.  Dx imaging: 09/14/22 Pelvis MRI  08/25/22 Bilat hips/pelvis XR  08/20/22 L-spine XR  05/03/21 L-spine MRI  02/19/21 L-spine XR  Pertinent review of systems: No fevers or chills  Relevant historical information: Heart disease.  Diabetes.   Exam:  BP 112/72   Pulse (!) 101   Ht 5\' 10"  (1.778 m)   Wt 193 lb 9.6 oz (87.8 kg)   SpO2 97%   BMI 27.78 kg/m  General: Well Developed, well nourished, and in no acute distress.   MSK: L-spine normal appearing. Tender palpation right lumbar paraspinal musculature. Decreased lumbar motion.    Lab and Radiology Results  EXAM: MRI PELVIS WITHOUT AND WITH CONTRAST   TECHNIQUE: Multiplanar multisequence MR imaging of the pelvis was performed both before and after administration of intravenous contrast.   CONTRAST:  10mL GADAVIST GADOBUTROL 1 MMOL/ML IV SOLN   COMPARISON:  Radiographs 08/25/2022   FINDINGS: Both hips are normally located. Moderate age advanced degenerative changes with degenerative chondrosis, joint space narrowing and early spurring. No stress fracture, AVN bone lesion. No joint effusion or periarticular fluid collections to suggest a paralabral cyst.   The pubic symphysis and SI joints are intact. Moderate degenerative changes. There is also severe lower lumbar facet disease noted. No worrisome bone lesions are identified involving the sacrum or right iliac bone. Small subchondral cystic change noted  involving right sacrum near the SI joint.   The hip and pelvic musculature are unremarkable. No muscle tear, myositis or mass. Mild bilateral peritrochanteric tendinosis and mild bursitis. The hamstring tendons are intact.   No significant intrapelvic abnormalities are identified. Mild prostate gland enlargement with median lobe hypertrophy impressing on the base of the bladder. The seminal vesicles normal. Slightly thick-walled bladder with trabeculation and cellules. Small left-sided bladder diverticulum.   No pelvic or inguinal adenopathy.  No inguinal hernia.   IMPRESSION: 1. No worrisome bone lesions. 2. Age advanced bilateral hip joint degenerative changes but no stress fracture or AVN. 3. Thick wall bladder with trabeculation and small left-sided bladder diverticulum.     Electronically Signed   By: Rudie Meyer M.D.   On: 09/19/2022 17:58 I, Reginald Rios, personally (independently) visualized and performed the interpretation of the images attached in this note.   EXAM: MRI LUMBAR SPINE WITHOUT CONTRAST   TECHNIQUE: Multiplanar, multisequence MR imaging of the lumbar spine was performed. No intravenous contrast was administered.   COMPARISON:  Lumbar radiographs 02/19/2021.  Lumbar MRI 09/19/2016.   FINDINGS: Segmentation: Normal, the same numbering system used on the 2018 MRI.   Alignment: Grade 1 anterolisthesis of L5 on S1 is stable since 2018, 4-5 mm. Mild dextroconvex scoliosis.   Vertebrae: No marrow edema or evidence of acute osseous abnormality. Chronic degenerative endplate marrow signal changes but normal background bone marrow signal. Small benign L2 vertebral body hemangioma. Intact visible sacrum.   Conus medullaris and cauda  equina: Conus extends to the T12 level. No lower spinal cord or conus signal abnormality.   Paraspinal and other soft tissues: Chronic renal parapelvic cysts appear stable. Other visualized abdominal viscera and  paraspinal soft tissues are within normal limits.   Disc levels:   Negative visible lower thoracic levels through T12-L1.   L1-L2: Disc desiccation, disc space loss and circumferential disc bulge with endplate spurring. Mild new epidural lipomatosis, but no significant stenosis.   L2-L3: Chronic disc space loss with circumferential disc osteophyte complex. Increased and moderate epidural lipomatosis. Subsequent mild new spinal stenosis (series 5, image 11). Mild facet hypertrophy and trace degenerative facet joint fluid on the left. Mild if any right L2 foraminal stenosis is stable.   L3-L4: Chronic disc space loss with circumferential disc osteophyte complex. Increased and moderate epidural lipomatosis. Mild to moderate facet and ligament flavum hypertrophy. Subsequent moderate spinal stenosis is new. No convincing foraminal stenosis.   L4-L5: Chronic disc space loss and bulky circumferential disc osteophyte complex eccentric to the left. Moderate facet and ligament flavum hypertrophy. New epidural lipomatosis. Subsequent severe spinal stenosis. Moderate to severe left lateral recess stenosis is chronic (left L5 nerve level). And moderate left, mild right L4 neural foraminal stenosis appears chronic and stable.   L5-S1: Chronic anterolisthesis with disc desiccation and circumferential disc/pseudo disc. New or increased midline annular fissure (series 5, image 30). Chronic moderate to severe facet and ligament flavum hypertrophy. Stable mild to moderate spinal stenosis at this level. Chronic moderate to severe lateral recess stenosis (S1 nerve levels) not significantly changed. And moderate right greater than left L5 foraminal stenosis appears stable.   IMPRESSION: 1. Increased epidural lipomatosis since a 2018 MRI superimposed on widespread chronic lumbar degeneration results in new or increased spinal stenosis throughout the lumbar spine, severe at L4-L5, and moderate at  L3-L4 and L5-S1.   2. Progressed L5-S1 disc degeneration in the form of midline annular fissure. Chronic grade 1 spondylolisthesis and severe facet arthropathy at that level.   3. Chronic moderate to severe lateral recess stenosis at the left L5 and bilateral S1 nerve levels. And stable moderate L4 and L5 neural foraminal stenosis.     Electronically Signed   By: Odessa Fleming M.D.   On: 05/06/2021 07:18   Assessment and Plan: 67 y.o. male with chronic pain right lower back right upper pelvis region.  He had a trial of conservative management without as much benefit as we would like. At the last that I ordered an x-ray of the hips which showed a sclerotic lesion in the right pelvis which we followed up with an MRI of the pelvis with and without contrast.  He did not have any concerning lesions on his pelvis MRI does have moderate degeneration appearing changes at the right SI joint.  His worst arthritis changes are present in the lower portion lumbar spine especially right L5-S1 joint.  This was previously seen on lumbar spine MRI 2022.  We discussed options.  Plan for trial of right facet injections L5-S1.  If this does not work I can try and SI joint injection.  If that does not work we asked radiology to try an SI joint injection.  Additionally MRI incidentally showed increased trabeculation of the bladder with a bladder diverticulum.  Plan to refer to urology.   PDMP not reviewed this encounter. Orders Placed This Encounter  Procedures   DG FACET JT INJ L /S SINGLE LEVEL RIGHT W/FL/CT    Standing Status:   Future  Standing Expiration Date:   09/22/2023    Order Specific Question:   Reason for Exam (SYMPTOM  OR DIAGNOSIS REQUIRED)    Answer:   Rt L5S1 facet    Order Specific Question:   Preferred Imaging Location?    Answer:   [id number]    Order Specific Question:   Radiology Contrast Protocol - do NOT remove file path    Answer:    [email or web address]   Ambulatory referral to Urology    Referral Priority:   Routine    Referral Type:   Consultation    Referral Reason:   Specialty Services Required    Requested Specialty:   Urology    Number of Visits Requested:   1   No orders of the defined types were placed in this encounter.    Discussed warning signs or symptoms. Please see discharge instructions. Patient expresses understanding.   The above documentation has been reviewed and is accurate and complete Reginald Rios, M.D. . Total encounter time 30 minutes including face-to-face time with the patient and, reviewing past medical record, and charting on the date of service.

## 2022-09-26 ENCOUNTER — Other Ambulatory Visit: Payer: Self-pay | Admitting: Family Medicine

## 2022-09-26 ENCOUNTER — Other Ambulatory Visit (HOSPITAL_BASED_OUTPATIENT_CLINIC_OR_DEPARTMENT_OTHER): Payer: Self-pay

## 2022-09-29 ENCOUNTER — Ambulatory Visit
Admission: RE | Admit: 2022-09-29 | Discharge: 2022-09-29 | Disposition: A | Discharge status: Home / Self Care | Payer: Commercial Managed Care - PPO | Source: Ambulatory Visit | Attending: Family Medicine | Admitting: Family Medicine

## 2022-09-29 ENCOUNTER — Other Ambulatory Visit: Payer: Self-pay

## 2022-09-29 ENCOUNTER — Other Ambulatory Visit (HOSPITAL_BASED_OUTPATIENT_CLINIC_OR_DEPARTMENT_OTHER): Payer: Self-pay

## 2022-09-29 DIAGNOSIS — M545 Low back pain, unspecified: Secondary | ICD-10-CM

## 2022-09-29 DIAGNOSIS — M47816 Spondylosis without myelopathy or radiculopathy, lumbar region: Secondary | ICD-10-CM

## 2022-09-29 DIAGNOSIS — M47817 Spondylosis without myelopathy or radiculopathy, lumbosacral region: Secondary | ICD-10-CM | POA: Diagnosis not present

## 2022-09-29 MED ORDER — IOPAMIDOL (ISOVUE-M 200) INJECTION 41%
1.0000 mL | Freq: Once | INTRAMUSCULAR | Status: AC
  Administered 2022-09-29: 1 mL via INTRA_ARTICULAR

## 2022-09-29 MED ORDER — METHYLPREDNISOLONE ACETATE 40 MG/ML INJ SUSP (RADIOLOG
80.0000 mg | Freq: Once | INTRAMUSCULAR | Status: AC
  Administered 2022-09-29: 80 mg via INTRA_ARTICULAR

## 2022-09-29 MED ORDER — PIOGLITAZONE HCL 45 MG PO TABS
45.0000 mg | ORAL_TABLET | Freq: Every day | ORAL | 5 refills | Status: DC
  Filled 2022-09-29: qty 30, 30d supply, fill #0
  Filled 2022-10-28: qty 30, 30d supply, fill #1
  Filled 2022-12-01: qty 30, 30d supply, fill #2
  Filled 2023-01-04: qty 30, 30d supply, fill #3
  Filled 2023-02-03: qty 30, 30d supply, fill #4
  Filled 2023-03-07: qty 30, 30d supply, fill #5

## 2022-09-29 NOTE — Discharge Instructions (Signed)

## 2022-09-30 ENCOUNTER — Other Ambulatory Visit (HOSPITAL_BASED_OUTPATIENT_CLINIC_OR_DEPARTMENT_OTHER): Payer: Self-pay

## 2022-09-30 MED ORDER — SILDENAFIL CITRATE 100 MG PO TABS
100.0000 mg | ORAL_TABLET | Freq: Every day | ORAL | 1 refills | Status: DC | PRN
  Filled 2022-09-30: qty 6, 30d supply, fill #0

## 2022-10-02 ENCOUNTER — Other Ambulatory Visit (HOSPITAL_BASED_OUTPATIENT_CLINIC_OR_DEPARTMENT_OTHER): Payer: Self-pay

## 2022-10-12 ENCOUNTER — Other Ambulatory Visit: Payer: Self-pay

## 2022-10-20 ENCOUNTER — Ambulatory Visit: Payer: Commercial Managed Care - PPO | Admitting: Family Medicine

## 2022-10-20 ENCOUNTER — Other Ambulatory Visit: Payer: Self-pay | Admitting: Family Medicine

## 2022-10-20 ENCOUNTER — Other Ambulatory Visit (HOSPITAL_BASED_OUTPATIENT_CLINIC_OR_DEPARTMENT_OTHER): Payer: Self-pay

## 2022-10-20 ENCOUNTER — Other Ambulatory Visit: Payer: Self-pay

## 2022-10-20 ENCOUNTER — Encounter (HOSPITAL_BASED_OUTPATIENT_CLINIC_OR_DEPARTMENT_OTHER): Payer: Self-pay

## 2022-10-20 ENCOUNTER — Telehealth: Payer: Self-pay

## 2022-10-20 ENCOUNTER — Encounter: Payer: Self-pay | Admitting: Family Medicine

## 2022-10-20 VITALS — BP 110/70 | HR 62 | Temp 98.4°F | Ht 70.0 in | Wt 200.2 lb

## 2022-10-20 DIAGNOSIS — Z7984 Long term (current) use of oral hypoglycemic drugs: Secondary | ICD-10-CM

## 2022-10-20 DIAGNOSIS — L309 Dermatitis, unspecified: Secondary | ICD-10-CM | POA: Diagnosis not present

## 2022-10-20 DIAGNOSIS — E119 Type 2 diabetes mellitus without complications: Secondary | ICD-10-CM

## 2022-10-20 DIAGNOSIS — M545 Low back pain, unspecified: Secondary | ICD-10-CM

## 2022-10-20 LAB — HEMOGLOBIN A1C: Hgb A1c MFr Bld: 7.8 % — ABNORMAL HIGH (ref 4.6–6.5)

## 2022-10-20 MED ORDER — RYBELSUS 7 MG PO TABS
7.0000 mg | ORAL_TABLET | Freq: Every day | ORAL | 2 refills | Status: DC
  Filled 2022-10-20: qty 30, 30d supply, fill #0

## 2022-10-20 MED ORDER — TRIAMCINOLONE ACETONIDE 0.1 % EX CREA
1.0000 | TOPICAL_CREAM | Freq: Two times a day (BID) | CUTANEOUS | 0 refills | Status: DC
Start: 2022-10-20 — End: 2023-05-11
  Filled 2022-10-20: qty 30, 15d supply, fill #0

## 2022-10-20 MED ORDER — RYBELSUS 3 MG PO TABS
3.0000 mg | ORAL_TABLET | Freq: Every day | ORAL | 0 refills | Status: DC
  Filled 2022-10-20: qty 30, 30d supply, fill #0

## 2022-10-20 NOTE — Progress Notes (Signed)
Subjective:   Chief Complaint  Patient presents with   Follow-up    Diabetes    Back Pain    Reginald Rios is a 67 y.o. male here for follow-up of diabetes.   Reginald Rios's self monitored glucose range is low-mid 100's.  Patient denies hypoglycemic reactions. He checks his glucose levels 1 time(s) per day. Patient does not require insulin.   Medications include: metformin 1000 mg bid, Actos 45 mg/d Diet is healthy.  Exercise: limited 2/2 pain No Cp or SOB.   Patient has been dealing with chronic low back pain and bladder thickening.  He takes testosterone and was told that could be related to that issue.  He has not had any urinary issues.  He had a couple injections in his back that did not help.  It sounds like an epidural injection is the next thing to try.  He has taken hydrocodone intermittently which does help.  He notes that the injections do not raise his sugars.  The back pain does limit his physical activity.  The patient has a several month history of a scaly and itchy lower neck/upper back region.  No new lotions, soaps, topicals, or detergents.  He has tried Vaseline anti-itch formulation without any relief.  He was thinking about using hydrocortisone.  No pain or drainage.  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   BPPV (benign paroxysmal positional vertigo) 08/14/2014   CAD (coronary artery disease)    2/19 PCI/DESx1 to Lcx   Cervical disc disorder with radiculopathy of cervical region 10/07/2015   Cervical radicular pain 01/10/2019   Chronic cough 04/13/2019   Decreased cardiac  ejection fraction 04/02/2015   Diabetes mellitus without complication (HCC)    Dyspnea on exertion 05/15/2015   Erectile dysfunction 07/11/2019   Exercise-induced asthma    Family history of early CAD 07/05/2017   Fatigue 05/15/2015   Hallux rigidus, right foot 09/15/2021   Heart murmur    Hemarthrosis of right elbow 02/03/2016   Hoarseness 04/13/2019   Hyperlipidemia    family hx of high cholesterol   Hypogonadism in male    Incomplete rotator cuff tear 09/10/2015   Injected 09/10/2015   Lateral epicondylitis of right elbow 07/28/2019   Low back pain    Low back pain at multiple sites 02/05/2021   Low vitamin D level 04/22/2017   Mild persistent asthma/cough variant asthma 04/13/2019   Myofascial pain syndrome, cervical 07/28/2019   Nonischemic cardiomyopathy (HCC) 05/15/2015   Nontraumatic incomplete tear of right rotator cuff 07/25/2019   Onychomycosis 05/25/2018   Osteoarthritis    Pain in right ankle and joints of right foot 10/05/2016   Palpitations 07/06/2017   Shoulder bursitis 10/12/2014   Injected in 10/12/2014  Repeat 01/21/2015   Small thenar eminence 02/03/2016   Spinal stenosis, lumbar region, without neurogenic claudication 10/12/2014   Spondylolisthesis at L5-S1 level 05/09/2021   Strain of latissimus dorsi muscle 01/21/2015   Tendinopathy of right biceps tendon 07/25/2019   Type 2 diabetes mellitus with hyperglycemia, without long-term current use of insulin (HCC) 12/22/2013   Ulnar neuropathy at elbow of right upper extremity  01/03/2019     Related testing: Retinal exam: Done Pneumovax: done  Objective:  BP 110/70 (BP Location: Left Arm, Patient Position: Sitting, Cuff Size: Normal)   Pulse 62   Temp 98.4 F (36.9 C) (Oral)   Ht 5\' 10"  (1.778 m)   Wt 200 lb 4 oz (90.8 kg)   SpO2 98%   BMI 28.73 kg/m  General:  Well developed, well nourished, in no apparent distress Skin: No obvious lesion but there is a patch of scaliness around T3 on the left;  no erythema, ecchymosis, edema, TTP, fluctuance, or drainage Head:  Normocephalic, atraumatic Eyes:  Pupils equal and round, sclera anicteric without injection  Lungs:  CTAB, no access msc use Cardio:  RRR, no bruits Neuro: Gait is cautious Psych: Age appropriate judgment and insight  Assessment:   Diabetes mellitus without complication (HCC) - Plan: Hemoglobin A1c  Dermatitis - Plan: triamcinolone cream (KENALOG) 0.1 %  Low back pain at multiple sites   Plan:   Chronic, hopefully stable.  Continue metformin 1000 mg twice daily, Actos 45 mg daily.  If not controlled, we will do Rybelsus.  Counseled on diet and exercise. Avoid scented products, try not to scratch, consider cool/cold products.  Kenalog 0.1% twice daily for 14 days. Appreciate sports medicine and specialty involvement.  If no procedure work, would consider referring to the pain clinic for further management. F/u in 3-6 mo. The patient voiced understanding and agreement to the plan.  I spent 35 minutes with the patient discussing the above plans in addition to reviewing his chart on the same day of the visit.  Jilda Roche [place], DO 10/20/22 9:38 AM

## 2022-10-20 NOTE — Telephone Encounter (Signed)
PA initiated via Covermymeds; KEY: [id number]. Awaiting determination.

## 2022-10-20 NOTE — Patient Instructions (Addendum)
Consider going back to Flonase.  Try not to scratch as this can make things worse. Avoid scented products while dealing with this. You may resume when the itchiness resolves. Cold/cool compresses can help.   Give [place] 2-3 business days to get the results of your labs back.   Keep the diet clean and stay active.  Let us know if you need anything.

## 2022-10-21 ENCOUNTER — Other Ambulatory Visit (HOSPITAL_BASED_OUTPATIENT_CLINIC_OR_DEPARTMENT_OTHER): Payer: Self-pay

## 2022-10-21 NOTE — Telephone Encounter (Signed)
Called informed the patient of approval. 

## 2022-10-21 NOTE — Telephone Encounter (Signed)
PA approved. Effective 10/20/2022 to 10/19/2023

## 2022-10-22 ENCOUNTER — Other Ambulatory Visit (HOSPITAL_BASED_OUTPATIENT_CLINIC_OR_DEPARTMENT_OTHER): Payer: Self-pay

## 2022-10-28 ENCOUNTER — Other Ambulatory Visit (HOSPITAL_BASED_OUTPATIENT_CLINIC_OR_DEPARTMENT_OTHER): Payer: Self-pay

## 2022-10-28 ENCOUNTER — Other Ambulatory Visit: Payer: Self-pay | Admitting: Internal Medicine

## 2022-10-28 DIAGNOSIS — J45990 Exercise induced bronchospasm: Secondary | ICD-10-CM

## 2022-10-29 ENCOUNTER — Other Ambulatory Visit: Payer: Self-pay

## 2022-10-30 ENCOUNTER — Other Ambulatory Visit (HOSPITAL_BASED_OUTPATIENT_CLINIC_OR_DEPARTMENT_OTHER): Payer: Self-pay

## 2022-11-03 ENCOUNTER — Encounter: Payer: Self-pay | Admitting: Family Medicine

## 2022-11-04 ENCOUNTER — Other Ambulatory Visit: Payer: Self-pay | Admitting: Family Medicine

## 2022-11-04 ENCOUNTER — Other Ambulatory Visit (HOSPITAL_BASED_OUTPATIENT_CLINIC_OR_DEPARTMENT_OTHER): Payer: Self-pay

## 2022-11-04 DIAGNOSIS — H524 Presbyopia: Secondary | ICD-10-CM | POA: Diagnosis not present

## 2022-11-04 DIAGNOSIS — H26111 Localized traumatic opacities, right eye: Secondary | ICD-10-CM | POA: Diagnosis not present

## 2022-11-04 DIAGNOSIS — H26493 Other secondary cataract, bilateral: Secondary | ICD-10-CM | POA: Diagnosis not present

## 2022-11-04 DIAGNOSIS — H52221 Regular astigmatism, right eye: Secondary | ICD-10-CM | POA: Diagnosis not present

## 2022-11-04 DIAGNOSIS — H35033 Hypertensive retinopathy, bilateral: Secondary | ICD-10-CM | POA: Diagnosis not present

## 2022-11-04 MED ORDER — DICYCLOMINE HCL 10 MG PO CAPS
ORAL_CAPSULE | ORAL | 0 refills | Status: DC
  Filled 2022-11-04: qty 30, 30d supply, fill #0

## 2022-11-04 MED ORDER — GLYBURIDE 5 MG PO TABS
5.0000 mg | ORAL_TABLET | Freq: Every day | ORAL | 1 refills | Status: DC
  Filled 2022-11-04: qty 30, 30d supply, fill #0

## 2022-11-04 MED ORDER — PREDNISOLONE ACETATE 1 % OP SUSP
1.0000 [drp] | Freq: Two times a day (BID) | OPHTHALMIC | 0 refills | Status: DC
  Filled 2022-11-04: qty 5, 5d supply, fill #0

## 2022-11-20 ENCOUNTER — Encounter: Payer: Self-pay | Admitting: Family Medicine

## 2022-11-24 ENCOUNTER — Other Ambulatory Visit (HOSPITAL_BASED_OUTPATIENT_CLINIC_OR_DEPARTMENT_OTHER): Payer: Self-pay

## 2022-11-24 ENCOUNTER — Ambulatory Visit (INDEPENDENT_AMBULATORY_CARE_PROVIDER_SITE_OTHER): Payer: Commercial Managed Care - PPO | Admitting: Internal Medicine

## 2022-11-24 VITALS — BP 112/66 | HR 68 | Temp 98.2°F | Resp 22 | Ht 70.0 in | Wt 195.3 lb

## 2022-11-24 DIAGNOSIS — J3089 Other allergic rhinitis: Secondary | ICD-10-CM

## 2022-11-24 DIAGNOSIS — K219 Gastro-esophageal reflux disease without esophagitis: Secondary | ICD-10-CM

## 2022-11-24 DIAGNOSIS — J4530 Mild persistent asthma, uncomplicated: Secondary | ICD-10-CM | POA: Diagnosis not present

## 2022-11-24 MED ORDER — FLUTICASONE PROPIONATE HFA 110 MCG/ACT IN AERO
2.0000 | INHALATION_SPRAY | Freq: Two times a day (BID) | RESPIRATORY_TRACT | 5 refills | Status: DC
  Filled 2022-11-24: qty 12, 30d supply, fill #0
  Filled 2023-05-10: qty 12, 30d supply, fill #1
  Filled 2023-06-25: qty 12, 30d supply, fill #2

## 2022-11-24 MED ORDER — AZELASTINE HCL 0.1 % NA SOLN
2.0000 | Freq: Two times a day (BID) | NASAL | 3 refills | Status: DC | PRN
Start: 2022-11-24 — End: 2023-08-25
  Filled 2022-11-24: qty 30, 50d supply, fill #0
  Filled 2023-02-27: qty 30, 50d supply, fill #1
  Filled 2023-05-10: qty 30, 50d supply, fill #2
  Filled 2023-07-28: qty 30, 50d supply, fill #3

## 2022-11-24 NOTE — Patient Instructions (Addendum)
Chronic Cough: intermittent asthma, chronic rhinitis, GERD  -mild worsening of symptoms Todays spirometry results: looked okay  Continue albuterol HFA, 2 to 4 inhalations every 4-6 hours if needed. Start Flovent 2 puff twice a day  Continue for 4 weeks then step down to as needed  Use before you brush your teeth, this counts as washing your mouth out     Allergic rhinitis with a nonallergic (irritant) component At your previous visit, Perennial and seasonal epicutaneous tests were negative despite a positive histamine control.  Intradermal tests were mildly reactive to grass pollen, major mold mix #4, and cockroach antigen. Continue allergen avoidance to the above as well as to irritants (perfumes, strong smells, chemical cleaners, etc) Continue azelastine nasal spray, 1 spray per nostril twice daily as needed for congestion.  Nasal saline spray (i.e., Simply Saline, Ocean Mist) or nasal saline lavage (i.e., NeilMed-with distilled water only) is recommended as needed and prior to medicated nasal sprays.   Acid reflux Appropriate reflux lifestyle modifications have been provided. Continue Pantoprazole (protonix) 40 mg nightly.  May take Tums as needed.  Follow up: 6 months   Thank you so much for letting me partake in your care today.  Don't hesitate to reach out if you have any additional concerns!  [name], MD  [facility] [place], [place]

## 2022-11-24 NOTE — Progress Notes (Unsigned)
Follow Up Note  RE: Reginald Rios MRN: [id number] DOB: 30-Sep-1955 Date of Office Visit: 11/24/2022  Referring provider: Sharlene Dory* Primary care provider: Sharlene Dory, DO  Chief Complaint: Asthma, Follow-up, and Cough (Dip coughing in the chest)  History of Present Illness: I had the pleasure of seeing Reginald Rios for a follow up visit at the [facility] on 11/25/2022. He is a 67 y.o. male, who is being followed for 06/19/21. His previous allergy office visit was on  06/19/21 with Dr. Maurine Minister. Today is a regular follow up visit.  History obtained from patient, chart review.  At last visit he was treated with a prednisone burst and got a chest x-ray due to worsening cough.   Pertinent history/diagnostics:   Sprirometry 04/13/19: EV1/FVC 120% pre and post, and FEV1 91% pre and 102% post with 12% change 01/20/21: FEV1: 2.98 L, 88% predicted (pre), 2.98 L, 88% predicted (post) FEV1/FVC ratio: 118% (pre), 116% (post) SPT 04/13/19 borderline ID to grass pollens, major mold mix #4 and cockroach. OCS: 06/19/21 CXR: 06/19/21: negative   Today he reports  Over the past 2-3 months he has increased deep cough symptoms.  Previously controlled with as needed albuterol, but less responsive now  -1 attack in the past month Trigger by a/c air, smoke exposure  He did find his old flovent inhaler today and used it.  Felt like it helped significantly  Nasal symptoms are well controlled astelin 1 SEN BID.   No adverse effects of medications.  GERD is controlled with pantoprazole Still having significant back pain due to severe lumbar arthritis. He is doing yoga 3 times a week, pool aerobics, and pickleball.  He is due for an epidural corticosteroid injection.    Assessment and Plan: Keng is a 67 y.o. male with: Allergic rhinitis with a possible nonallergic component - Plan: azelastine (ASTELIN) 0.1 % nasal spray  Mild persistent asthma without  complication  Gastroesophageal reflux disease, unspecified whether esophagitis present  *** Plan: Patient Instructions  Chronic Cough: intermittent asthma, chronic rhinitis, GERD  -mild worsening of symptoms Todays spirometry results: looked okay  Continue albuterol HFA, 2 to 4 inhalations every 4-6 hours if needed. Start Flovent 2 puff twice a day  Continue for 4 weeks then step down to as needed  Use before you brush your teeth, this counts as washing your mouth out     Allergic rhinitis with a nonallergic (irritant) component At your previous visit, Perennial and seasonal epicutaneous tests were negative despite a positive histamine control.  Intradermal tests were mildly reactive to grass pollen, major mold mix #4, and cockroach antigen. Continue allergen avoidance to the above as well as to irritants (perfumes, strong smells, chemical cleaners, etc) Continue azelastine nasal spray, 1 spray per nostril twice daily as needed for congestion.  Nasal saline spray (i.e., Simply Saline, Ocean Mist) or nasal saline lavage (i.e., NeilMed-with distilled water only) is recommended as needed and prior to medicated nasal sprays.   Acid reflux Appropriate reflux lifestyle modifications have been provided. Continue Pantoprazole (protonix) 40 mg nightly.  May take Tums as needed.  Follow up: 6 months   Thank you so much for letting me partake in your care today.  Don't hesitate to reach out if you have any additional concerns!  [name], MD  [facility] [place]    Meds ordered this encounter  Medications   fluticasone (FLOVENT HFA) 110 MCG/ACT inhaler  Sig: Inhale 2 puffs into the lungs 2 (two) times daily. Use with spacer. Rinse, gargle, and spit after use.    Dispense:  12 g    Refill:  5   azelastine (ASTELIN) 0.1 % nasal spray    Sig: Place 2 sprays into both nostrils 2 (two) times daily as needed for rhinitis (nasal congestion).     Dispense:  30 mL    Refill:  3    Lab Orders  No laboratory test(s) ordered today   Diagnostics: Spirometry:  Tracings reviewed. His effort: Good reproducible efforts. FVC: 3.17 L FEV1: 2.64 L, 79% predicted FEV1/FVC ratio: 83% Interpretation: Spirometry consistent with normal pattern.  Please see scanned spirometry results for details.     Medication List:  Current Outpatient Medications  Medication Sig Dispense Refill   albuterol (VENTOLIN HFA) 108 (90 Base) MCG/ACT inhaler Inhale 2 puffs by mouth every 4-6 hours as needed for coughing or wheezing spells 6.7 g 1   baclofen (LIORESAL) 10 MG tablet Take 1 tablet (10 mg total) by mouth 3 (three) times daily as needed for muscle spasms. 30 each 1   Blood Glucose Monitoring Suppl (FREESTYLE FREEDOM LITE) w/Device KIT Use to check blood sugar daily. 1 kit 0   Calcium Carbonate-Vit D-Min (QC CALCIUM-MAGNESIUM-ZINC-D3 PO) Take 1 tablet by mouth at bedtime.     clopidogrel (PLAVIX) 75 MG tablet Take 1 tablet (75 mg total) by mouth daily with breakfast. 90 tablet 2   diclofenac Sodium (VOLTAREN) 1 % GEL Apply 2 g topically 4 (four) times daily. 100 g 0   dicyclomine (BENTYL) 10 MG capsule Take 1 tab every 6 hours as needed for abdominal cramping. 30 capsule 0   ezetimibe (ZETIA) 10 MG tablet Take 1 tablet (10 mg total) by mouth daily. 90 tablet 1   glucose blood (FREESTYLE LITE) test strip Use daily to check blood sugar. 100 each 3   glyBURIDE (DIABETA) 5 MG tablet Take 1 tablet (5 mg total) by mouth daily with breakfast. 30 tablet 1   Lancets (FREESTYLE) lancets Use daily to check blood sugar 100 each 3   losartan (COZAAR) 25 MG tablet Take 1 tablet (25 mg total) by mouth daily. 90 tablet 1   metFORMIN (GLUCOPHAGE) 1000 MG tablet Take 1 tablet (1,000 mg total) by mouth 2 (two) times daily with a meal. 180 tablet 3   methylphenidate (RITALIN) 20 MG tablet Take 1 tablet (20 mg total) by mouth 2 (two) times daily. 60 tablet 0    methylphenidate (RITALIN) 20 MG tablet Take 1 tablet (20 mg total) by mouth 2 (two) times daily. 60 tablet 0   methylphenidate (RITALIN) 20 MG tablet Take 1 tablet (20 mg total) by mouth 2 (two) times daily. 60 tablet 0   montelukast (SINGULAIR) 10 MG tablet Take 1 tablet (10 mg total) by mouth daily. 90 tablet 3   Multiple Vitamin (MULTIVITAMIN) tablet Take 1 tablet by mouth daily.     nitroGLYCERIN (NITROSTAT) 0.4 MG SL tablet Place 1 tablet (0.4 mg total) under the tongue every 5 (five) minutes as needed. 25 tablet 2   pantoprazole (PROTONIX) 40 MG tablet Take 1 tablet (40 mg total) by mouth daily. 30 tablet 3   pioglitazone (ACTOS) 45 MG tablet Take 1 tablet (45 mg total) by mouth daily. 30 tablet 5   prednisoLONE acetate (PRED FORTE) 1 % ophthalmic suspension Place 1 drop into the left eye 2 (two) times daily. Use for 5 days after laser procedure 5 mL  0   sildenafil (VIAGRA) 100 MG tablet Take 1 tablet (100 mg total) by mouth daily as needed 1 hour prior to sexual activity, do not exceed more than 1 tablet per 24 hour period 10 tablet 1   tadalafil (CIALIS) 20 MG tablet Take 0.5-1 tablets (10-20 mg total) by mouth every other day as needed for erectile dysfunction. 10 tablet 5   testosterone (ANDROGEL) 50 MG/5GM (1%) GEL APPLY 2 APPLICATIONS ON TO THE SKIN DAILY 300 g 5   triamcinolone cream (KENALOG) 0.1 % Apply 1 Application topically 2 (two) times daily. 30 g 0   azelastine (ASTELIN) 0.1 % nasal spray Place 2 sprays into both nostrils 2 (two) times daily as needed for rhinitis (nasal congestion). 30 mL 3   fluticasone (FLOVENT HFA) 110 MCG/ACT inhaler Inhale 2 puffs into the lungs 2 (two) times daily. Use with spacer. Rinse, gargle, and spit after use. 12 g 5   No current facility-administered medications for this visit.   Allergies: Allergies  Allergen Reactions   Invokana [Canagliflozin] Rash   Grass Pollen(K-O-R-T-Swt Vern)    Lisinopril Cough   Semaglutide Nausea Only    Patient  is intolerant to Rybelsus   I reviewed his past medical history, social history, family history, and environmental history and no significant changes have been reported from his previous visit.  ROS: All others negative except as noted per HPI.   Objective: BP 112/66   Pulse 68   Temp 98.2 F (36.8 C) (Temporal)   Resp (!) 22   Ht 5\' 10"  (1.778 m)   Wt 195 lb 4.8 oz (88.6 kg)   SpO2 99%   BMI 28.02 kg/m  Body mass index is 28.02 kg/m. General Appearance:  Alert, cooperative, no distress, appears stated age  Head:  Normocephalic, without obvious abnormality, atraumatic  Eyes:  Conjunctiva clear, EOM's intact  Nose: Nares normal, normal mucosa, no visible anterior polyps, and septum midline  Throat: Lips, tongue normal; teeth and gums normal, normal posterior oropharynx  Neck: Supple, symmetrical  Lungs:   clear to auscultation bilaterally, Respirations unlabored, no coughing  Heart:  regular rate and rhythm and no murmur, Appears well perfused  Extremities: No edema  Skin: Skin color, texture, turgor normal, no rashes or lesions on visualized portions of skin  Neurologic: No gross deficits   Previous notes and tests were reviewed. The plan was reviewed with the patient/family, and all questions/concerned were addressed.  It was my pleasure to see Reginald Rios today and participate in his care. Please feel free to contact me with any questions or concerns.  Sincerely,  [name], MD  Allergy & Immunology  [facility] [place] Office: [phone]

## 2022-11-26 DIAGNOSIS — H26491 Other secondary cataract, right eye: Secondary | ICD-10-CM | POA: Diagnosis not present

## 2022-12-07 ENCOUNTER — Encounter: Payer: Self-pay | Admitting: Family Medicine

## 2022-12-13 ENCOUNTER — Other Ambulatory Visit (HOSPITAL_BASED_OUTPATIENT_CLINIC_OR_DEPARTMENT_OTHER): Payer: Self-pay

## 2022-12-14 ENCOUNTER — Other Ambulatory Visit: Payer: Self-pay

## 2022-12-14 ENCOUNTER — Other Ambulatory Visit (HOSPITAL_BASED_OUTPATIENT_CLINIC_OR_DEPARTMENT_OTHER): Payer: Self-pay

## 2022-12-17 ENCOUNTER — Encounter (INDEPENDENT_AMBULATORY_CARE_PROVIDER_SITE_OTHER): Payer: Self-pay

## 2022-12-28 DIAGNOSIS — M542 Cervicalgia: Secondary | ICD-10-CM | POA: Diagnosis not present

## 2022-12-29 ENCOUNTER — Ambulatory Visit (INDEPENDENT_AMBULATORY_CARE_PROVIDER_SITE_OTHER): Payer: Commercial Managed Care - PPO

## 2022-12-29 ENCOUNTER — Ambulatory Visit (INDEPENDENT_AMBULATORY_CARE_PROVIDER_SITE_OTHER): Payer: Commercial Managed Care - PPO | Admitting: Family Medicine

## 2022-12-29 ENCOUNTER — Other Ambulatory Visit: Payer: Self-pay

## 2022-12-29 ENCOUNTER — Other Ambulatory Visit (HOSPITAL_BASED_OUTPATIENT_CLINIC_OR_DEPARTMENT_OTHER): Payer: Self-pay

## 2022-12-29 VITALS — BP 116/68 | HR 82 | Ht 70.0 in | Wt 199.0 lb

## 2022-12-29 DIAGNOSIS — M549 Dorsalgia, unspecified: Secondary | ICD-10-CM | POA: Diagnosis not present

## 2022-12-29 DIAGNOSIS — G8929 Other chronic pain: Secondary | ICD-10-CM

## 2022-12-29 DIAGNOSIS — M5441 Lumbago with sciatica, right side: Secondary | ICD-10-CM | POA: Diagnosis not present

## 2022-12-29 DIAGNOSIS — M47816 Spondylosis without myelopathy or radiculopathy, lumbar region: Secondary | ICD-10-CM | POA: Diagnosis not present

## 2022-12-29 DIAGNOSIS — M4317 Spondylolisthesis, lumbosacral region: Secondary | ICD-10-CM

## 2022-12-29 MED ORDER — PREDNISONE 50 MG PO TABS
50.0000 mg | ORAL_TABLET | Freq: Every day | ORAL | 0 refills | Status: DC
  Filled 2022-12-29: qty 5, 5d supply, fill #0

## 2022-12-29 NOTE — Patient Instructions (Addendum)
Thank you for coming in today.   Please get an Xray today before you leave   I've referred you to Physical Therapy.  Let us know if you don't hear from them in one week.   Take the prednisone if you worsen.   Recheck in 6 weeks.   Let me know sooner if this is worsening or you have a problem.

## 2022-12-29 NOTE — Progress Notes (Unsigned)
Rubin Payor, PhD, LAT, ATC acting as a scribe for [name], MD.  Reginald Rios is a 67 y.o. male who presents to [facility] today for R leg pain. Pt was last seen by Dr. [name] on 09/22/22 and a R facet injection was ordered at L5-S1 and later performed on May 28th. 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   Today, pt reports worsening pain 2 wks ago when he was doing a yoga class. He was doing some type of "warrior pose." Pt locates pain to his R buttock w/ radiating up along the posterior aspect of his R leg to Achilles. He notes no relief from prior facet injection in May.  Low back pain: yes- bilat Radiating pain: yes LE numbness/tingling: yes- R heel LE weakness: yes- R leg Aggravates: walking, leading w/ R leg going up stairs Treatments tried: heat, cold, TENS unit, chrio, traction, tylenol, baclofen  Dx imaging: 09/14/22 Pelvis MRI             08/25/22 Bilat hips/pelvis XR             08/20/22 L-spine XR             05/03/21 L-spine MRI             02/19/21 L-spine XR  Pertinent review of systems: No fevers or chills  Relevant historical information: Diabetes History right Achilles tendon repair.  Exam:  BP 116/68   Pulse 82   Ht 5\' 10"  (1.778 m)   Wt 199 lb (90.3 kg)   SpO2 98%   BMI 28.55 kg/m  General: Well Developed, well nourished, and in no acute distress.   MSK: L-spine nontender palpation spinal midline.  Tender palpation paraspinal musculature bilaterally. Decreased lumbar motion. Lower extremity strength generally is intact. Reflexes are intact for Sensation is intact.  Right hip: Not particularly tender along hamstring.  Abnormal/positive H test.  Hip strength is intact.  Right heel: Mature scar overlying Achilles tendon.  Mildly tender palpation normal foot and ankle motion.    Lab and Radiology Results  X-ray  images lumbar spine obtained today personally and independently interpreted Multilevel DDD and facet DJD.  No acute fractures are visible. Await formal radiology review    Assessment and Plan: 67 y.o. male with right leg pain.  It is hard to tell all of the causes of his pain.  I think he probably has overlapping conditions that are contribution to his pain.  He probably does have a lumbar radicular component thought to be L5 or S1.  He may also have a hamstring strain and he may have a portion of Achilles tendinitis. Plan to repeat lumbar spine x-ray to rule out compression fracture or other acute significant changes.  Plan for retrial of PT.  It has been a while since he has had formal physical therapy.  If not improved consider advanced imaging such as lumbar spine MRIs.  His last MRI lumbar spine was about 67 years old now. Short course of prednisone prescribed.  PDMP not reviewed this encounter. Orders Placed This Encounter  Procedures   DG Lumbar Spine 2-3 Views    Standing Status:   Future    Number of Occurrences:   1    Standing Expiration Date:   12/29/2023    Order Specific Question:   Reason for Exam (SYMPTOM  OR DIAGNOSIS REQUIRED)  Answer:   eval low back pain    Order Specific Question:   Preferred imaging location?    Answer:   [facility]   Ambulatory referral to Physical Therapy    Referral Priority:   Routine    Referral Type:   Physical Medicine    Referral Reason:   Specialty Services Required    Requested Specialty:   Physical Therapy    Number of Visits Requested:   1   Meds ordered this encounter  Medications   predniSONE (DELTASONE) 50 MG tablet    Sig: Take 1 tablet (50 mg total) by mouth daily.    Dispense:  5 tablet    Refill:  0     Discussed warning signs or symptoms. Please see discharge instructions. Patient expresses understanding.   The above documentation has been reviewed and is accurate and complete [name], M.D.

## 2022-12-30 ENCOUNTER — Ambulatory Visit
Payer: Commercial Managed Care - PPO | Attending: Family Medicine | Admitting: Rehabilitative and Restorative Service Providers"

## 2022-12-30 ENCOUNTER — Other Ambulatory Visit: Payer: Self-pay

## 2022-12-30 ENCOUNTER — Encounter: Payer: Self-pay | Admitting: Rehabilitative and Restorative Service Providers"

## 2022-12-30 DIAGNOSIS — R29898 Other symptoms and signs involving the musculoskeletal system: Secondary | ICD-10-CM | POA: Diagnosis not present

## 2022-12-30 DIAGNOSIS — M4317 Spondylolisthesis, lumbosacral region: Secondary | ICD-10-CM | POA: Insufficient documentation

## 2022-12-30 DIAGNOSIS — G8929 Other chronic pain: Secondary | ICD-10-CM | POA: Insufficient documentation

## 2022-12-30 DIAGNOSIS — M5441 Lumbago with sciatica, right side: Secondary | ICD-10-CM | POA: Insufficient documentation

## 2022-12-30 NOTE — Therapy (Signed)
OUTPATIENT PHYSICAL THERAPY THORACOLUMBAR EVALUATION   Patient Name: Reginald Rios MRN: [id number] DOB:1955/11/24, 67 y.o., male Today's Date: 12/30/2022  END OF SESSION:  PT End of Session - 12/30/22 1700     Visit Number 1    Number of Visits 24    Date for PT Re-Evaluation 03/24/23    Authorization Type [employer] $20 copay    PT Start Time 1400    PT Stop Time 1447    PT Time Calculation (min) 47 min    Activity Tolerance Patient tolerated treatment well             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   BPPV (benign paroxysmal positional vertigo) 08/14/2014   CAD (coronary artery disease)    2/19 PCI/DESx1 to Lcx   Cervical disc disorder with radiculopathy of cervical region 10/07/2015   Cervical radicular pain 01/10/2019   Chronic cough 04/13/2019   Decreased cardiac ejection fraction 04/02/2015   Diabetes mellitus without complication (HCC)    Dyspnea on exertion 05/15/2015   Erectile dysfunction 07/11/2019   Exercise-induced asthma    Family history of early CAD 07/05/2017   Fatigue 05/15/2015   Hallux rigidus, right foot 09/15/2021   Heart murmur    Hemarthrosis of right elbow 02/03/2016   Hoarseness 04/13/2019   Hyperlipidemia    family hx of high cholesterol   Hypogonadism in male    Incomplete rotator cuff tear 09/10/2015   Injected 09/10/2015   Lateral epicondylitis of right elbow 07/28/2019   Low back pain at multiple sites 02/05/2021   Low vitamin D level 04/22/2017   Mild persistent asthma/cough variant asthma 04/13/2019   Myofascial  pain syndrome, cervical 07/28/2019   Nonischemic cardiomyopathy (HCC) 05/15/2015   Nontraumatic incomplete tear of right rotator cuff 07/25/2019   Onychomycosis 05/25/2018   Osteoarthritis    Pain in right ankle and joints of right foot 10/05/2016   Palpitations 07/06/2017   Shoulder bursitis 10/12/2014   Injected in 10/12/2014  Repeat 01/21/2015   Small thenar eminence 02/03/2016   Spinal stenosis, lumbar region, without neurogenic claudication 10/12/2014   Spondylolisthesis at L5-S1 level 05/09/2021   Strain of latissimus dorsi muscle 01/21/2015   Tendinopathy of right biceps tendon 07/25/2019   Type 2 diabetes mellitus with hyperglycemia, without long-term current use of insulin (HCC) 12/22/2013   Ulnar neuropathy at elbow of right upper extremity 01/03/2019   Past Surgical History:  Procedure Laterality Date   CORONARY PRESSURE/FFR STUDY N/A 06/23/2017   Procedure: INTRAVASCULAR PRESSURE WIRE/FFR STUDY;  Surgeon: Marykay Lex, MD;  Location: [facility] INVASIVE CV LAB;  Service: Cardiovascular;  Laterality: N/A;   CORONARY STENT INTERVENTION N/A 06/23/2017   Procedure: CORONARY STENT INTERVENTION;  Surgeon: Marykay Lex, MD;  Location: [facility] INVASIVE CV LAB;  Service: Cardiovascular;  Laterality: N/A;   KNEE ARTHROSCOPY     LEFT HEART CATH AND CORONARY ANGIOGRAPHY N/A 06/23/2017   Procedure: LEFT HEART CATH AND CORONARY ANGIOGRAPHY;  Surgeon: Marykay Lex, MD;  Location: [facility] INVASIVE CV LAB;  Service: Cardiovascular;  Laterality: N/A;   SHOULDER ARTHROSCOPY WITH BICEPSTENOTOMY Right 09/06/2019  Procedure: SHOULDER ARTHROSCOPY WITH BICEPSTENOTOMY;  Surgeon: Tarry Kos, MD;  Location: [facility];  Service: Orthopedics;  Laterality: Right;   SHOULDER ARTHROSCOPY WITH SUBACROMIAL DECOMPRESSION Right 09/06/2019   Procedure: RIGHT SHOULDER ARTHROSCOPY WITH EXTENSIVE DEBRIDEMENT, SUBACROMIAL DECOMPRESSION, BICEPS TENOTOMY;  Surgeon: Tarry Kos, MD;  Location: [facility];  Service: Orthopedics;  Laterality: Right;   TENOTOMY ACHILLES TENDON     Patient Active Problem List   Diagnosis Date Noted   Hallux rigidus, right foot 09/15/2021   Spondylolisthesis at L5-S1 level 05/09/2021   Low back pain at multiple sites 02/05/2021   Osteoarthritis    Heart murmur    Exercise-induced asthma    Diabetes mellitus without complication (HCC)    Arthritis    Anemia    Myofascial pain syndrome, cervical 07/28/2019   Lateral epicondylitis of right elbow 07/28/2019   Nontraumatic incomplete tear of right rotator cuff 07/25/2019   Tendinopathy of right biceps tendon 07/25/2019   Erectile dysfunction 07/11/2019   Mild persistent asthma/cough variant asthma 04/13/2019   Allergic rhinitis with a possible nonallergic component 04/13/2019   Acid reflux 04/13/2019   Chronic cough 04/13/2019   Hoarseness 04/13/2019   Cervical radicular pain 01/10/2019   Ulnar neuropathy at elbow of right upper extremity 01/03/2019   Acute bilateral low back pain without sciatica 01/03/2019   Onychomycosis 05/25/2018   Palpitations 07/06/2017   CAD (coronary artery disease) 07/05/2017   Family history of early CAD 07/05/2017   Abnormal findings on diagnostic imaging of cardiovascular system 06/23/2017   Low vitamin D level 04/22/2017   Pain in right ankle and joints of right foot 10/05/2016   Hemarthrosis of right elbow 02/03/2016   Small thenar eminence 02/03/2016   Cervical disc disorder with radiculopathy of cervical region 10/07/2015   Incomplete rotator cuff tear 09/10/2015   Nonischemic cardiomyopathy (HCC) 05/15/2015   Fatigue 05/15/2015   Dyspnea on exertion 05/15/2015   Decreased cardiac ejection fraction 04/02/2015   Strain of latissimus dorsi muscle 01/21/2015   Shoulder bursitis 10/12/2014   Spinal stenosis, lumbar region, without neurogenic claudication 10/12/2014   BPPV (benign paroxysmal positional vertigo) 08/14/2014   Hyperlipidemia 01/19/2014    Anemia, unspecified 01/19/2014   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   Type 2 diabetes mellitus with hyperglycemia, without long-term current use of insulin (HCC) 12/22/2013   Hypogonadism in male 12/22/2013   Arthritis of hand, left 12/22/2013    PCP: Dr Arva Chafe, DO  REFERRING PROVIDER: Dr Rodolph Bong   REFERRING DIAG:  Spondylolisthesis at L5-S1 level  Chronic bilateral low back pain with right-sided sciatica    Rationale for Evaluation and Treatment: Rehabilitation  THERAPY DIAG:  Spondylolisthesis at L5-S1 level  Chronic bilateral low back pain with right-sided sciatica  Other symptoms and signs involving the musculoskeletal system  ONSET DATE: 12/12/22  SUBJECTIVE:  SUBJECTIVE STATEMENT: Patient reports that he was doing well with minimal to no back pain with yoga; water exercises and some pickle ball until 12/12/22 when he experienced pain in the R hamstrings and calf after doing stretching with yoga. Pain has persisted since that time. He started on prednisone two days ago with good improvement.  He has continued with pain R hamstring and Achilles. Has been seeing the chiropractor once a month but he does not think that is helping. He is working with chair yoga and gentle pool exercise.  PERTINENT HISTORY:  Chronic LBP; bilat hamstring tightness; bilat knees; cervical disc surgery; AODM   PAIN:  Are you having pain? Yes: NPRS scale: 7/10 LB; 6/10 R leg  Pain location: LB; R leg Pain description: dull aching in the LB;; sharp in the leg  Aggravating factors: back - sitting > 10 min; leg - turning foot/twisting  Relieving factors: meds   PRECAUTIONS: None  RED FLAGS: None   WEIGHT BEARING RESTRICTIONS: No  FALLS:  Has patient fallen in last 6  months? No  LIVING ENVIRONMENT: Lives with: lives with their spouse Lives in: House/apartment Stairs: Yes: Internal: 24 steps; on right going up and External: 3 steps; on right going up Has following equipment at home: None  OCCUPATION: retired [employer] officer, [employer]; active playing pickle ball   PLOF: Independent  PATIENT GOALS: stop most of the pain; get back to activities   NEXT MD VISIT: 02/09/23  OBJECTIVE:   DIAGNOSTIC FINDINGS:  Xray 12/29/22 - results unavailable   PATIENT SURVEYS:  FOTO 31; goals 48   SCREENING FOR RED FLAGS: Bowel or bladder incontinence: No Spinal tumors: No Cauda equina syndrome: No Compression fracture: No Abdominal aneurysm: No  COGNITION: Overall cognitive status: Within functional limits for tasks assessed     SENSATION: WFL  MUSCLE LENGTH: Hamstrings: Right 70 deg; Left 65 deg Tight hip flexors bilat   POSTURE: rounded shoulders, forward head, decreased lumbar lordosis, increased thoracic kyphosis, flexed trunk , and weight shift right  PALPATION: Pain with PA mobs lumbar spine; R lumbar paraspinals; posterior hip in gluts and piriformis; hamstrings   LUMBAR ROM:   AROM eval  Flexion 70% pain bilat LB  Extension 10% pain R mid back   Right lateral flexion 65% pull L > R LB  Left lateral flexion 60% pull R LB  Right rotation 20% pain mid back  Left rotation 20% discomfort mid back    (Blank rows = not tested)  LOWER EXTREMITY ROM:     Active Assistive Right eval Left eval  Hip flexion    Hip extension 0 0  Hip abduction    Hip adduction    Hip internal rotation    Hip external rotation    Knee flexion 90 95  Knee extension -5 -5  Ankle dorsiflexion 0 0  Ankle plantarflexion    Ankle inversion    Ankle eversion     (Blank rows = not tested)  LOWER EXTREMITY MMT:    MMT Right eval Left eval  Hip flexion 5- 5  Hip extension 4- 5-  Hip abduction 3+ 4+  Hip adduction    Hip internal rotation    Hip external rotation     Knee flexion 5 5  Knee extension 5 5  Ankle dorsiflexion    Ankle plantarflexion    Ankle inversion    Ankle eversion     (Blank rows = not tested)  LUMBAR SPECIAL TESTS:  Straight leg raise test: Negative  and Slump test: Negative  FUNCTIONAL TESTS:  SLS: R 2 sec ; L 1 sec  5 times sit to stand: 23.5 sec  GAIT: Distance walked: 40  Assistive device utilized: None Level of assistance: Complete Independence Comments: flexed forward at trunk and hips   [facility] Adult PT Treatment:                                                DATE: 12/30/22 Therapeutic Exercise: Supine  Hamstring strength w/ strap 30 sec x 3  ITB stretch with strap 30 sec x 1  Piriformis travell with strap 30 sec x 3  Transverse abdominals pelvic floor and transverse abdominals 10 sec x 5  Manual Therapy: Consider trial of DN      PATIENT EDUCATION:  Education details: POC; HEP Person educated: Patient Education method: Programmer, multimedia, Demonstration, Tactile cues, Verbal cues, and Handouts Education comprehension: verbalized understanding, returned demonstration, verbal cues required, tactile cues required, and needs further education  HOME EXERCISE PROGRAM: Access Code: [email or web address] URL: [email or web address] Date: 12/30/2022 Prepared by: Corlis Leak  Exercises - Hooklying Hamstring Stretch with Strap  - 2 x daily - 7 x weekly - 1 sets - 3 reps - 30 sec  hold - Supine ITB Stretch with Strap  - 2 x daily - 7 x weekly - 1 sets - 3 reps - 30 sec  hold - Supine Piriformis Stretch with Leg Straight  - 2 x daily - 7 x weekly - 1 sets - 3 reps - 30 sec  hold - Supine Transversus Abdominis Bracing with Pelvic Floor Contraction  - 2 x daily - 7 x weekly - 1 sets - 10 reps - 10sec  hold  ASSESSMENT:  CLINICAL IMPRESSION: Patient is a 67 y.o. male who was seen today for physical therapy evaluation and treatment for  Spondylolisthesis at L5-S1 level and   Chronic bilateral low back pain with right-sided  sciatica  .   OBJECTIVE IMPAIRMENTS: Abnormal gait, decreased activity tolerance, decreased endurance, decreased mobility, decreased ROM, hypomobility, increased fascial restrictions, increased muscle spasms, impaired flexibility, improper body mechanics, postural dysfunction, and pain.   ACTIVITY LIMITATIONS: bending, sitting, standing, squatting, stairs, transfers, locomotion level, and sports/exercise  PARTICIPATION LIMITATIONS: community activity, yard work, and sports/exercise  PERSONAL FACTORS: Age, Behavior pattern, Education, Fitness, Past/current experiences, and Time since onset of injury/illness/exacerbation are also affecting patient's functional outcome.   REHAB POTENTIAL: Good  CLINICAL DECISION MAKING: Stable/uncomplicated  EVALUATION COMPLEXITY: Low   GOALS: Goals reviewed with patient? Yes  SHORT TERM GOALS: Target date: 02/10/2023   Independent in initial HEP  Baseline: Goal status: INITIAL  2.  Decrease LB and R LE pain by 25-50% allowing patient to return to normal activities  Baseline:  Goal status: INITIAL   LONG TERM GOALS: Target date: 03/24/2023   Increase hamstring flexibility to ~ 75 deg bilat  Baseline:  Goal status: INITIAL  2.  Trunk ROM/mobility WFL's and minimal to no pain  Baseline:  Goal status: INITIAL  3.  Increase strength R LE to 4+/5 to 5/5  Baseline:  Goal status: INITIAL  4.  Patient reports return to regular activities including regular exercise - yoga and water exercises Baseline:  Goal status: INITIAL  5.  Independent in HEP including aquatic program as indicated  Baseline:  Goal status: INITIAL  6.  Improve functional limitation score  to 48 Baseline:  Goal status: INITIAL  PLAN:  PT FREQUENCY: 2x/week  PT DURATION: 12 weeks  PLANNED INTERVENTIONS: Therapeutic exercises, Therapeutic activity, Neuromuscular re-education, Balance training, Gait training, Patient/Family education, Self Care, Joint mobilization,  Aquatic Therapy, Dry Needling, Electrical stimulation, Spinal mobilization, Cryotherapy, Moist heat, Taping, Traction, Ultrasound, Ionotophoresis 4mg /ml Dexamethasone, Manual therapy, and Re-evaluation.  PLAN FOR NEXT SESSION: review and progress exercises; continue efforts to educate patient re back care and body mechanics; manual work, DN, modalities as indicated    [employer], PT 12/30/2022, 5:01 PM

## 2022-12-30 NOTE — Therapy (Deleted)
OUTPATIENT PHYSICAL THERAPY THORACOLUMBAR EVALUATION   Patient Name: Reginald Rios MRN: [id number] DOB:Jun 04, 1955, 67 y.o., male Today's Date: 12/30/2022  END OF SESSION:   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   BPPV (benign paroxysmal positional vertigo) 08/14/2014   CAD (coronary artery disease)    2/19 PCI/DESx1 to Lcx   Cervical disc disorder with radiculopathy of cervical region 10/07/2015   Cervical radicular pain 01/10/2019   Chronic cough 04/13/2019   Decreased cardiac ejection fraction 04/02/2015   Diabetes mellitus without complication (HCC)    Dyspnea on exertion 05/15/2015   Erectile dysfunction 07/11/2019   Exercise-induced asthma    Family history of early CAD 07/05/2017   Fatigue 05/15/2015   Hallux rigidus, right foot 09/15/2021   Heart murmur    Hemarthrosis of right elbow 02/03/2016   Hoarseness 04/13/2019   Hyperlipidemia    family hx of high cholesterol   Hypogonadism in male    Incomplete rotator cuff tear 09/10/2015   Injected 09/10/2015   Lateral epicondylitis of right elbow 07/28/2019   Low back pain at multiple sites 02/05/2021   Low vitamin D level 04/22/2017   Mild persistent asthma/cough variant asthma 04/13/2019   Myofascial pain syndrome, cervical 07/28/2019   Nonischemic cardiomyopathy (HCC) 05/15/2015   Nontraumatic incomplete tear of right rotator cuff 07/25/2019   Onychomycosis 05/25/2018   Osteoarthritis    Pain in right ankle and joints of right foot 10/05/2016   Palpitations 07/06/2017   Shoulder bursitis 10/12/2014   Injected in  10/12/2014  Repeat 01/21/2015   Small thenar eminence 02/03/2016   Spinal stenosis, lumbar region, without neurogenic claudication 10/12/2014   Spondylolisthesis at L5-S1 level 05/09/2021   Strain of latissimus dorsi muscle 01/21/2015   Tendinopathy of right biceps tendon 07/25/2019   Type 2 diabetes mellitus with hyperglycemia, without long-term current use of insulin (HCC) 12/22/2013   Ulnar neuropathy at elbow of right upper extremity 01/03/2019   Past Surgical History:  Procedure Laterality Date   CORONARY PRESSURE/FFR STUDY N/A 06/23/2017   Procedure: INTRAVASCULAR PRESSURE WIRE/FFR STUDY;  Surgeon: Marykay Lex, MD;  Location: [facility] INVASIVE CV LAB;  Service: Cardiovascular;  Laterality: N/A;   CORONARY STENT INTERVENTION N/A 06/23/2017   Procedure: CORONARY STENT INTERVENTION;  Surgeon: Marykay Lex, MD;  Location: [facility] INVASIVE CV LAB;  Service: Cardiovascular;  Laterality: N/A;   KNEE ARTHROSCOPY     LEFT HEART CATH AND CORONARY ANGIOGRAPHY N/A 06/23/2017   Procedure: LEFT HEART CATH AND CORONARY ANGIOGRAPHY;  Surgeon: Marykay Lex, MD;  Location: [facility] INVASIVE CV LAB;  Service: Cardiovascular;  Laterality: N/A;   SHOULDER ARTHROSCOPY WITH BICEPSTENOTOMY Right 09/06/2019   Procedure: SHOULDER ARTHROSCOPY WITH BICEPSTENOTOMY;  Surgeon: Tarry Kos, MD;  Location: [facility];  Service: Orthopedics;  Laterality: Right;   SHOULDER ARTHROSCOPY WITH SUBACROMIAL DECOMPRESSION Right 09/06/2019   Procedure: RIGHT SHOULDER ARTHROSCOPY WITH EXTENSIVE DEBRIDEMENT, SUBACROMIAL DECOMPRESSION, BICEPS TENOTOMY;  Surgeon: Tarry Kos, MD;  Location: [facility];  Service: Orthopedics;  Laterality: Right;   TENOTOMY ACHILLES TENDON     Patient Active Problem List   Diagnosis Date Noted  Hallux rigidus, right foot 09/15/2021   Spondylolisthesis at L5-S1 level 05/09/2021   Low back pain at multiple sites 02/05/2021   Osteoarthritis    Heart murmur     Exercise-induced asthma    Diabetes mellitus without complication (HCC)    Arthritis    Anemia    Myofascial pain syndrome, cervical 07/28/2019   Lateral epicondylitis of right elbow 07/28/2019   Nontraumatic incomplete tear of right rotator cuff 07/25/2019   Tendinopathy of right biceps tendon 07/25/2019   Erectile dysfunction 07/11/2019   Mild persistent asthma/cough variant asthma 04/13/2019   Allergic rhinitis with a possible nonallergic component 04/13/2019   Acid reflux 04/13/2019   Chronic cough 04/13/2019   Hoarseness 04/13/2019   Cervical radicular pain 01/10/2019   Ulnar neuropathy at elbow of right upper extremity 01/03/2019   Acute bilateral low back pain without sciatica 01/03/2019   Onychomycosis 05/25/2018   Palpitations 07/06/2017   CAD (coronary artery disease) 07/05/2017   Family history of early CAD 07/05/2017   Abnormal findings on diagnostic imaging of cardiovascular system 06/23/2017   Low vitamin D level 04/22/2017   Pain in right ankle and joints of right foot 10/05/2016   Hemarthrosis of right elbow 02/03/2016   Small thenar eminence 02/03/2016   Cervical disc disorder with radiculopathy of cervical region 10/07/2015   Incomplete rotator cuff tear 09/10/2015   Nonischemic cardiomyopathy (HCC) 05/15/2015   Fatigue 05/15/2015   Dyspnea on exertion 05/15/2015   Decreased cardiac ejection fraction 04/02/2015   Strain of latissimus dorsi muscle 01/21/2015   Shoulder bursitis 10/12/2014   Spinal stenosis, lumbar region, without neurogenic claudication 10/12/2014   BPPV (benign paroxysmal positional vertigo) 08/14/2014   Hyperlipidemia 01/19/2014   Anemia, unspecified 01/19/2014   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   Type 2 diabetes mellitus with hyperglycemia, without long-term current use of insulin (HCC) 12/22/2013   Hypogonadism in male 12/22/2013   Arthritis of hand, left 12/22/2013    PCP: Dr Arva Chafe,  DO  REFERRING PROVIDER: Dr Rodolph Bong   REFERRING DIAG:  Spondylolisthesis at L5-S1 level  Chronic bilateral low back pain with right-sided sciatica    Rationale for Evaluation and Treatment: Rehabilitation  THERAPY DIAG:  No diagnosis found.  ONSET DATE: ***  SUBJECTIVE:                                                                                                                                                                                           SUBJECTIVE STATEMENT: ***  PERTINENT HISTORY:  ***  PAIN:  Are you having pain? Yes: NPRS scale: ***/10 Pain location: *** Pain description: ***  Aggravating factors: *** Relieving factors: ***  PRECAUTIONS: None  RED FLAGS: None   WEIGHT BEARING RESTRICTIONS: No  FALLS:  Has patient fallen in last 6 months? No  LIVING ENVIRONMENT: Lives with: lives with their spouse Lives in: House/apartment Stairs: {opstairs:27293} Has following equipment at home: {Assistive devices:23999}  OCCUPATION: retired Conservation officer, nature; active Camera operator   PLOF: Independent  PATIENT GOALS: ***  NEXT MD VISIT: 02/09/23  OBJECTIVE:   DIAGNOSTIC FINDINGS:  ***  PATIENT SURVEYS:  FOTO   SCREENING FOR RED FLAGS: Bowel or bladder incontinence: No Spinal tumors: No Cauda equina syndrome: No Compression fracture: No Abdominal aneurysm: No  COGNITION: Overall cognitive status: Within functional limits for tasks assessed     SENSATION: WFL  MUSCLE LENGTH: Hamstrings: Right *** deg; Left *** deg Thomas test: Right *** deg; Left *** deg  POSTURE: {posture:25561}  PALPATION: ***  LUMBAR ROM:   AROM eval  Flexion   Extension   Right lateral flexion   Left lateral flexion   Right rotation   Left rotation    (Blank rows = not tested)  LOWER EXTREMITY ROM:     Active Assistive Right eval Left eval  Hip flexion    Hip extension    Hip abduction    Hip adduction    Hip internal rotation    Hip  external rotation    Knee flexion    Knee extension    Ankle dorsiflexion    Ankle plantarflexion    Ankle inversion    Ankle eversion     (Blank rows = not tested)  LOWER EXTREMITY MMT:    MMT Right eval Left eval  Hip flexion    Hip extension    Hip abduction    Hip adduction    Hip internal rotation    Hip external rotation    Knee flexion    Knee extension    Ankle dorsiflexion    Ankle plantarflexion    Ankle inversion    Ankle eversion     (Blank rows = not tested)  LUMBAR SPECIAL TESTS:  Straight leg raise test: {pos/neg:25243} and Slump test: {pos/neg:25243}  FUNCTIONAL TESTS:  5 times sit to stand:   GAIT: Distance walked: *** Assistive device utilized: {Assistive devices:23999} Level of assistance: {Levels of assistance:24026} Comments: ***  [facility] Adult PT Treatment:                                                DATE: *** Therapeutic Exercise: *** Manual Therapy: *** Neuromuscular re-ed: *** Therapeutic Activity: *** Gait: *** Modalities: *** Self Care: ***     PATIENT EDUCATION:  Education details: *** Person educated: {Person educated:25204} Education method: {Education Method:25205} Education comprehension: {Education Comprehension:25206}  HOME EXERCISE PROGRAM: ***  ASSESSMENT:  CLINICAL IMPRESSION: Patient is a 67 y.o. male who was seen today for physical therapy evaluation and treatment for  Spondylolisthesis at L5-S1 level and   Chronic bilateral low back pain with right-sided sciatica  .   OBJECTIVE IMPAIRMENTS: Abnormal gait, decreased activity tolerance, decreased endurance, decreased mobility, decreased ROM, hypomobility, increased fascial restrictions, increased muscle spasms, impaired flexibility, improper body mechanics, postural dysfunction, and pain.   ACTIVITY LIMITATIONS: bending, sitting, standing, squatting, stairs, transfers, locomotion level, and sports/exercise  PARTICIPATION LIMITATIONS: community  activity, yard work, and sports/exercise  PERSONAL FACTORS: Age, Behavior pattern, Education, Fitness, Past/current  experiences, and Time since onset of injury/illness/exacerbation are also affecting patient's functional outcome.   REHAB POTENTIAL: Good  CLINICAL DECISION MAKING: Stable/uncomplicated  EVALUATION COMPLEXITY: Low   GOALS: Goals reviewed with patient? Yes  SHORT TERM GOALS: Target date: ***  Independent in initial HEP  Baseline: Goal status: INITIAL  2.  *** Baseline:  Goal status: INITIAL   LONG TERM GOALS: Target date: ***  *** Baseline:  Goal status: INITIAL  2.  *** Baseline:  Goal status: INITIAL  3.  *** Baseline:  Goal status: INITIAL  4.  *** Baseline:  Goal status: INITIAL  5.  *** Baseline:  Goal status: INITIAL  6.  *** Baseline:  Goal status: INITIAL  PLAN:  PT FREQUENCY: 2x/week  PT DURATION: 12 weeks  PLANNED INTERVENTIONS: Therapeutic exercises, Therapeutic activity, Neuromuscular re-education, Balance training, Gait training, Patient/Family education, Self Care, Joint mobilization, Aquatic Therapy, Dry Needling, Electrical stimulation, Spinal mobilization, Cryotherapy, Moist heat, Taping, Traction, Ultrasound, Ionotophoresis 4mg /ml Dexamethasone, Manual therapy, and Re-evaluation.  PLAN FOR NEXT SESSION: review and progress exercises; continue efforts to educate patient re back care and body mechanics; manual work, DN, modalities as indicated    [employer], PT 12/30/2022, 10:38 AM

## 2022-12-31 ENCOUNTER — Other Ambulatory Visit (HOSPITAL_BASED_OUTPATIENT_CLINIC_OR_DEPARTMENT_OTHER): Payer: Self-pay

## 2022-12-31 ENCOUNTER — Ambulatory Visit: Payer: Commercial Managed Care - PPO | Admitting: Rehabilitative and Restorative Service Providers"

## 2022-12-31 ENCOUNTER — Encounter: Payer: Self-pay | Admitting: Cardiology

## 2022-12-31 ENCOUNTER — Ambulatory Visit: Payer: Commercial Managed Care - PPO | Attending: Cardiology | Admitting: Cardiology

## 2022-12-31 VITALS — BP 122/60 | HR 95 | Ht 70.0 in | Wt 199.1 lb

## 2022-12-31 DIAGNOSIS — I2510 Atherosclerotic heart disease of native coronary artery without angina pectoris: Secondary | ICD-10-CM

## 2022-12-31 DIAGNOSIS — E782 Mixed hyperlipidemia: Secondary | ICD-10-CM

## 2022-12-31 DIAGNOSIS — E119 Type 2 diabetes mellitus without complications: Secondary | ICD-10-CM

## 2022-12-31 NOTE — Progress Notes (Signed)
Cardiology Office Note:    Date:  12/31/2022   ID:  Reginald Rios, DOB Dec 10, 1955, MRN [id number]  PCP:  Sharlene Dory, DO  Cardiologist:  Garwin Brothers, MD   Referring MD: Sharlene Dory*    ASSESSMENT:    1. Coronary artery disease involving native coronary artery of native heart without angina pectoris   2. Diabetes mellitus without complication (HCC)   3. Mixed hyperlipidemia    PLAN:    In order of problems listed above:  Coronary artery disease: Secondary prevention stressed with the patient.  Importance of compliance with diet medication stressed any vocalized understanding.  He was advised to continue his regular exercise once his sciatica is better. Essential hypertension: Blood pressure stable and diet was emphasized.  Lifestyle modification urged. Mixed dyslipidemia: On lipid-lowering medications followed by primary care.  Lipids reviewed and discussed with him. Diabetes mellitus: Significant elevation in hemoglobin A1c and I discussed this with him.  He promises to do better. Patient will be seen in follow-up appointment in 6 months or earlier if the patient has any concerns.    Medication Adjustments/Labs and Tests Ordered: Current medicines are reviewed at length with the patient today.  Concerns regarding medicines are outlined above.  Orders Placed This Encounter  Procedures   EKG 12-Lead   No orders of the defined types were placed in this encounter.    No chief complaint on file.    History of Present Illness:    Reginald Rios is a 67 y.o. male.  Patient has past medical history of coronary artery disease, mixed dyslipidemia and diabetes mellitus.  He denies any problems at this time and takes care of activities of daily living.  No chest pain orthopnea or PND.  At the time of my evaluation, the patient is alert awake oriented and in no distress.  He is having issues with sciatica and therefore recent steroid therapy.  Past  Medical History:  Diagnosis Date   Abnormal findings on diagnostic imaging of cardiovascular system 06/23/2017   Acid reflux 04/13/2019   Acute bilateral low back pain without sciatica 01/03/2019   Allergic rhinitis with a possible nonallergic component 04/13/2019   Anemia    Anemia, unspecified 01/19/2014   Arthritis    Arthritis of hand, left 12/22/2013   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   BPPV (benign paroxysmal positional vertigo) 08/14/2014   CAD (coronary artery disease)    2/19 PCI/DESx1 to Lcx   Cervical disc disorder with radiculopathy of cervical region 10/07/2015   Cervical radicular pain 01/10/2019   Chronic cough 04/13/2019   Decreased cardiac ejection fraction 04/02/2015   Diabetes mellitus without complication (HCC)    Dyspnea on exertion 05/15/2015   Erectile dysfunction 07/11/2019   Exercise-induced asthma    Family history of early CAD 07/05/2017   Fatigue 05/15/2015   Hallux rigidus, right foot 09/15/2021   Heart murmur    Hemarthrosis of right elbow 02/03/2016   Hoarseness 04/13/2019   Hyperlipidemia    family hx of high cholesterol   Hypogonadism in male    Incomplete rotator cuff tear 09/10/2015   Injected 09/10/2015   Lateral epicondylitis of right elbow 07/28/2019   Low back pain at multiple sites 02/05/2021   Low vitamin D level 04/22/2017   Mild persistent asthma/cough variant asthma 04/13/2019   Myofascial pain syndrome, cervical 07/28/2019   Nonischemic cardiomyopathy (HCC) 05/15/2015   Nontraumatic incomplete tear of right rotator cuff 07/25/2019   Onychomycosis 05/25/2018  Osteoarthritis    Pain in right ankle and joints of right foot 10/05/2016   Palpitations 07/06/2017   Shoulder bursitis 10/12/2014   Injected in 10/12/2014  Repeat 01/21/2015   Small thenar eminence 02/03/2016   Spinal stenosis, lumbar region, without neurogenic claudication 10/12/2014   Spondylolisthesis at L5-S1 level 05/09/2021    Strain of latissimus dorsi muscle 01/21/2015   Tendinopathy of right biceps tendon 07/25/2019   Type 2 diabetes mellitus with hyperglycemia, without long-term current use of insulin (HCC) 12/22/2013   Ulnar neuropathy at elbow of right upper extremity 01/03/2019    Past Surgical History:  Procedure Laterality Date   CORONARY PRESSURE/FFR STUDY N/A 06/23/2017   Procedure: INTRAVASCULAR PRESSURE WIRE/FFR STUDY;  Surgeon: Marykay Lex, MD;  Location: [facility] INVASIVE CV LAB;  Service: Cardiovascular;  Laterality: N/A;   CORONARY STENT INTERVENTION N/A 06/23/2017   Procedure: CORONARY STENT INTERVENTION;  Surgeon: Marykay Lex, MD;  Location: [facility] INVASIVE CV LAB;  Service: Cardiovascular;  Laterality: N/A;   KNEE ARTHROSCOPY     LEFT HEART CATH AND CORONARY ANGIOGRAPHY N/A 06/23/2017   Procedure: LEFT HEART CATH AND CORONARY ANGIOGRAPHY;  Surgeon: Marykay Lex, MD;  Location: [facility] INVASIVE CV LAB;  Service: Cardiovascular;  Laterality: N/A;   SHOULDER ARTHROSCOPY WITH BICEPSTENOTOMY Right 09/06/2019   Procedure: SHOULDER ARTHROSCOPY WITH BICEPSTENOTOMY;  Surgeon: Tarry Kos, MD;  Location: [facility];  Service: Orthopedics;  Laterality: Right;   SHOULDER ARTHROSCOPY WITH SUBACROMIAL DECOMPRESSION Right 09/06/2019   Procedure: RIGHT SHOULDER ARTHROSCOPY WITH EXTENSIVE DEBRIDEMENT, SUBACROMIAL DECOMPRESSION, BICEPS TENOTOMY;  Surgeon: Tarry Kos, MD;  Location: [facility];  Service: Orthopedics;  Laterality: Right;   TENOTOMY ACHILLES TENDON      Current Medications: Current Meds  Medication Sig   albuterol (VENTOLIN HFA) 108 (90 Base) MCG/ACT inhaler Inhale 2 puffs by mouth every 4-6 hours as needed for coughing or wheezing spells   azelastine (ASTELIN) 0.1 % nasal spray Place 2 sprays into both nostrils 2 (two) times daily as needed for rhinitis (nasal congestion).   baclofen (LIORESAL) 10 MG tablet Take 1 tablet (10 mg total) by mouth 3 (three) times daily as  needed for muscle spasms.   Blood Glucose Monitoring Suppl (FREESTYLE FREEDOM LITE) w/Device KIT Use to check blood sugar daily.   Calcium Carbonate-Vit D-Min (QC CALCIUM-MAGNESIUM-ZINC-D3 PO) Take 1 tablet by mouth at bedtime.   clopidogrel (PLAVIX) 75 MG tablet Take 1 tablet (75 mg total) by mouth daily with breakfast.   diclofenac Sodium (VOLTAREN) 1 % GEL Apply 2 g topically 4 (four) times daily.   dicyclomine (BENTYL) 10 MG capsule Take 1 tab every 6 hours as needed for abdominal cramping.   ezetimibe (ZETIA) 10 MG tablet Take 1 tablet (10 mg total) by mouth daily.   fluticasone (FLOVENT HFA) 110 MCG/ACT inhaler Inhale 2 puffs into the lungs 2 (two) times daily. Use with spacer. Rinse, gargle, and spit after use.   glucose blood (FREESTYLE LITE) test strip Use daily to check blood sugar.   glyBURIDE (DIABETA) 5 MG tablet Take 1 tablet (5 mg total) by mouth daily with breakfast.   Lancets (FREESTYLE) lancets Use daily to check blood sugar   losartan (COZAAR) 25 MG tablet Take 1 tablet (25 mg total) by mouth daily.   metFORMIN (GLUCOPHAGE) 1000 MG tablet Take 1 tablet (1,000 mg total) by mouth 2 (two) times daily with a meal.   methylphenidate (RITALIN) 20 MG tablet Take 1 tablet (20 mg total) by  mouth 2 (two) times daily.   methylphenidate (RITALIN) 20 MG tablet Take 1 tablet (20 mg total) by mouth 2 (two) times daily.   methylphenidate (RITALIN) 20 MG tablet Take 1 tablet (20 mg total) by mouth 2 (two) times daily.   montelukast (SINGULAIR) 10 MG tablet Take 1 tablet (10 mg total) by mouth daily.   Multiple Vitamin (MULTIVITAMIN) tablet Take 1 tablet by mouth daily.   nitroGLYCERIN (NITROSTAT) 0.4 MG SL tablet Place 0.4 mg under the tongue every 5 (five) minutes as needed for chest pain.   pantoprazole (PROTONIX) 40 MG tablet Take 1 tablet (40 mg total) by mouth daily.   pioglitazone (ACTOS) 45 MG tablet Take 1 tablet (45 mg total) by mouth daily.   prednisoLONE acetate (PRED FORTE) 1 %  ophthalmic suspension Place 1 drop into the left eye 2 (two) times daily. Use for 5 days after laser procedure   predniSONE (DELTASONE) 50 MG tablet Take 1 tablet (50 mg total) by mouth daily.   sildenafil (VIAGRA) 100 MG tablet Take 1 tablet (100 mg total) by mouth daily as needed 1 hour prior to sexual activity, do not exceed more than 1 tablet per 24 hour period   tadalafil (CIALIS) 20 MG tablet Take 0.5-1 tablets (10-20 mg total) by mouth every other day as needed for erectile dysfunction.   testosterone (ANDROGEL) 50 MG/5GM (1%) GEL APPLY 2 APPLICATIONS ON TO THE SKIN DAILY   triamcinolone cream (KENALOG) 0.1 % Apply 1 Application topically 2 (two) times daily.     Allergies:   Invokana [canagliflozin], Grass pollen([id number]-swt vern), Lisinopril, and Semaglutide   Social History   Socioeconomic History   Marital status: Married    Spouse name: Not on file   Number of children: Not on file   Years of education: Not on file   Highest education level: Some college, no degree  Occupational History   Occupation: Conservation officer, historic buildings   Tobacco Use   Smoking status: Never   Smokeless tobacco: Never  Vaping Use   Vaping status: Never Used  Substance and Sexual Activity   Alcohol use: Yes    Alcohol/week: 2.0 standard drinks of alcohol    Types: 2 Glasses of wine per week    Comment: rare   Drug use: No   Sexual activity: Not on file  Other Topics Concern   Not on file  Social History Narrative   Previously worked as Environmental manager   Currently working at [employer]   Married 30 years   2 children ages 98, 22   Wife is psychiatrist - Dr. Annitta Needs   Moved from [place]   Grew up in [place]   Social Determinants of Health   Financial Resource Strain: Low Risk  (10/18/2022)   Overall Financial Resource Strain (CARDIA)    Difficulty of Paying Living Expenses: Not very hard  Food Insecurity: No Food Insecurity (10/18/2022)   Hunger Vital Sign    Worried About Running Out of  Food in the Last Year: Never true    Ran Out of Food in the Last Year: Never true  Transportation Needs: No Transportation Needs (10/18/2022)   PRAPARE - Administrator, Civil Service (Medical): No    Lack of Transportation (Non-Medical): No  Physical Activity: Sufficiently Active (10/18/2022)   Exercise Vital Sign    Days of Exercise per Week: 6 days    Minutes of Exercise per Session: 120 min  Stress: No Stress Concern Present (10/18/2022)   [employer]  of Occupational Health - Occupational Stress Questionnaire    Feeling of Stress : Not at all  Social Connections: Socially Integrated (10/18/2022)   Social Connection and Isolation Panel [NHANES]    Frequency of Communication with Friends and Family: More than three times a week    Frequency of Social Gatherings with Friends and Family: More than three times a week    Attends Religious Services: 1 to 4 times per year    Active Member of [employer] or Organizations: Yes    Attends Engineer, structural: More than 4 times per year    Marital Status: Married     Family History: The patient's family history includes Asthma in his paternal aunt; Cancer in his mother; Coronary artery disease (age of onset: 51) in his father; Food Allergy in his son; Heart disease in his father; Hypothyroidism in his brother; Lung cancer (age of onset: 52) in his mother. There is no history of Colon cancer, Prostate cancer, Allergic rhinitis, Angioedema, Eczema, Immunodeficiency, or Urticaria.  ROS:   Please see the history of present illness.    All other systems reviewed and are negative.  EKGs/Labs/Other Studies Reviewed:    The following studies were reviewed today: I discussed my findings with the patient at length.   Recent Labs: 02/16/2022: TSH 2.55 07/21/2022: ALT 18; BUN 20; Creatinine, Ser 0.81; Hemoglobin 12.6; Platelets 247.0; Potassium 4.4; Sodium 141  Recent Lipid Panel    Component Value Date/Time   CHOL 158  07/21/2022 1014   CHOL 156 04/22/2022 1439   TRIG 77.0 07/21/2022 1014   HDL 54.70 07/21/2022 1014   HDL 53 04/22/2022 1439   CHOLHDL 3 07/21/2022 1014   VLDL 15.4 07/21/2022 1014   LDLCALC 88 07/21/2022 1014   LDLCALC 89 04/22/2022 1439    Physical Exam:    VS:  BP 122/60   Pulse 95   Ht 5\' 10"  (1.778 m)   Wt 199 lb 1.9 oz (90.3 kg)   SpO2 97%   BMI 28.57 kg/m     Wt Readings from Last 3 Encounters:  12/31/22 199 lb 1.9 oz (90.3 kg)  12/29/22 199 lb (90.3 kg)  11/24/22 195 lb 4.8 oz (88.6 kg)     GEN: Patient is in no acute distress HEENT: Normal NECK: No JVD; No carotid bruits LYMPHATICS: No lymphadenopathy CARDIAC: Hear sounds regular, 2/6 systolic murmur at the apex. RESPIRATORY:  Clear to auscultation without rales, wheezing or rhonchi  ABDOMEN: Soft, non-tender, non-distended MUSCULOSKELETAL:  No edema; No deformity  SKIN: Warm and dry NEUROLOGIC:  Alert and oriented x 3 PSYCHIATRIC:  Normal affect   Signed, Garwin Brothers, MD  12/31/2022 2:47 PM     [facility]

## 2022-12-31 NOTE — Patient Instructions (Signed)
Medication Instructions:  Your physician recommends that you continue on your current medications as directed. Please refer to the Current Medication list given to you today.  *If you need a refill on your cardiac medications before your next appointment, please call your pharmacy*   Lab Work: None ordered If you have labs (blood work) drawn today and your tests are completely normal, you will receive your results only by: [place] (if you have MyChart) OR A paper copy in the mail If you have any lab test that is abnormal or we need to change your treatment, we will call you to review the results.   Testing/Procedures: None ordered   Follow-Up: At [facility], you and your health needs are our priority.  As part of our continuing mission to provide you with exceptional heart care, we have created designated Provider Care Teams.  These Care Teams include your primary Cardiologist (physician) and Advanced Practice Providers (APPs -  Physician Assistants and Nurse Practitioners) who all work together to provide you with the care you need, when you need it.  We recommend signing up for the patient portal called "MyChart".  Sign up information is provided on this After Visit Summary.  MyChart is used to connect with patients for Virtual Visits (Telemedicine).  Patients are able to view lab/test results, encounter notes, upcoming appointments, etc.  Non-urgent messages can be sent to your provider as well.   To learn more about what you can do with MyChart, go to [email or web address].    Your next appointment:   9 month(s)  The format for your next appointment:   In Person  Provider:   Jyl Heinz, MD    Other Instructions none  Important Information About Sugar

## 2023-01-04 ENCOUNTER — Other Ambulatory Visit: Payer: Self-pay | Admitting: Family Medicine

## 2023-01-04 DIAGNOSIS — K219 Gastro-esophageal reflux disease without esophagitis: Secondary | ICD-10-CM

## 2023-01-05 ENCOUNTER — Other Ambulatory Visit: Payer: Self-pay

## 2023-01-05 ENCOUNTER — Other Ambulatory Visit (HOSPITAL_BASED_OUTPATIENT_CLINIC_OR_DEPARTMENT_OTHER): Payer: Self-pay

## 2023-01-05 MED ORDER — PANTOPRAZOLE SODIUM 40 MG PO TBEC
40.0000 mg | DELAYED_RELEASE_TABLET | Freq: Every day | ORAL | 3 refills | Status: DC
Start: 2023-01-05 — End: 2023-05-28
  Filled 2023-01-05 – 2023-02-03 (×3): qty 30, 30d supply, fill #0
  Filled 2023-02-27: qty 30, 30d supply, fill #1
  Filled 2023-03-23: qty 30, 30d supply, fill #2
  Filled 2023-04-07 – 2023-04-16 (×2): qty 30, 30d supply, fill #3

## 2023-01-06 ENCOUNTER — Ambulatory Visit
Payer: Commercial Managed Care - PPO | Attending: Family Medicine | Admitting: Rehabilitative and Restorative Service Providers"

## 2023-01-06 ENCOUNTER — Encounter: Payer: Self-pay | Admitting: Rehabilitative and Restorative Service Providers"

## 2023-01-06 DIAGNOSIS — M4317 Spondylolisthesis, lumbosacral region: Secondary | ICD-10-CM | POA: Diagnosis not present

## 2023-01-06 DIAGNOSIS — R29898 Other symptoms and signs involving the musculoskeletal system: Secondary | ICD-10-CM

## 2023-01-06 DIAGNOSIS — M6281 Muscle weakness (generalized): Secondary | ICD-10-CM | POA: Diagnosis not present

## 2023-01-06 DIAGNOSIS — M5441 Lumbago with sciatica, right side: Secondary | ICD-10-CM | POA: Diagnosis not present

## 2023-01-06 DIAGNOSIS — G8929 Other chronic pain: Secondary | ICD-10-CM

## 2023-01-06 NOTE — Therapy (Signed)
OUTPATIENT PHYSICAL THERAPY THORACOLUMBAR EVALUATION   Patient Name: Reginald Rios MRN: [id number] DOB:12-16-1955, 67 y.o., male Today's Date: 01/06/2023  END OF SESSION:  PT End of Session - 01/06/23 1406     Visit Number 2    Number of Visits 24    Date for PT Re-Evaluation 03/24/23    Authorization Type [employer] $20 copay    PT Start Time 1400    PT Stop Time 1448    PT Time Calculation (min) 48 min    Activity Tolerance Patient tolerated treatment well             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   BPPV (benign paroxysmal positional vertigo) 08/14/2014   CAD (coronary artery disease)    2/19 PCI/DESx1 to Lcx   Cervical disc disorder with radiculopathy of cervical region 10/07/2015   Cervical radicular pain 01/10/2019   Chronic cough 04/13/2019   Decreased cardiac ejection fraction 04/02/2015   Diabetes mellitus without complication (HCC)    Dyspnea on exertion 05/15/2015   Erectile dysfunction 07/11/2019   Exercise-induced asthma    Family history of early CAD 07/05/2017   Fatigue 05/15/2015   Hallux rigidus, right foot 09/15/2021   Heart murmur    Hemarthrosis of right elbow 02/03/2016   Hoarseness 04/13/2019   Hyperlipidemia    family hx of high cholesterol   Hypogonadism in male    Incomplete rotator cuff tear 09/10/2015   Injected 09/10/2015   Lateral epicondylitis of right elbow 07/28/2019   Low back pain at multiple sites 02/05/2021   Low vitamin D level 04/22/2017   Mild persistent asthma/cough variant asthma 04/13/2019   Myofascial pain  syndrome, cervical 07/28/2019   Nonischemic cardiomyopathy (HCC) 05/15/2015   Nontraumatic incomplete tear of right rotator cuff 07/25/2019   Onychomycosis 05/25/2018   Osteoarthritis    Pain in right ankle and joints of right foot 10/05/2016   Palpitations 07/06/2017   Shoulder bursitis 10/12/2014   Injected in 10/12/2014  Repeat 01/21/2015   Small thenar eminence 02/03/2016   Spinal stenosis, lumbar region, without neurogenic claudication 10/12/2014   Spondylolisthesis at L5-S1 level 05/09/2021   Strain of latissimus dorsi muscle 01/21/2015   Tendinopathy of right biceps tendon 07/25/2019   Type 2 diabetes mellitus with hyperglycemia, without long-term current use of insulin (HCC) 12/22/2013   Ulnar neuropathy at elbow of right upper extremity 01/03/2019   Past Surgical History:  Procedure Laterality Date   CORONARY PRESSURE/FFR STUDY N/A 06/23/2017   Procedure: INTRAVASCULAR PRESSURE WIRE/FFR STUDY;  Surgeon: Marykay Lex, MD;  Location: [facility] INVASIVE CV LAB;  Service: Cardiovascular;  Laterality: N/A;   CORONARY STENT INTERVENTION N/A 06/23/2017   Procedure: CORONARY STENT INTERVENTION;  Surgeon: Marykay Lex, MD;  Location: [facility] INVASIVE CV LAB;  Service: Cardiovascular;  Laterality: N/A;   KNEE ARTHROSCOPY     LEFT HEART CATH AND CORONARY ANGIOGRAPHY N/A 06/23/2017   Procedure: LEFT HEART CATH AND CORONARY ANGIOGRAPHY;  Surgeon: Marykay Lex, MD;  Location: [facility] INVASIVE CV LAB;  Service: Cardiovascular;  Laterality: N/A;   SHOULDER ARTHROSCOPY WITH BICEPSTENOTOMY Right 09/06/2019  Procedure: SHOULDER ARTHROSCOPY WITH BICEPSTENOTOMY;  Surgeon: Tarry Kos, MD;  Location: [facility];  Service: Orthopedics;  Laterality: Right;   SHOULDER ARTHROSCOPY WITH SUBACROMIAL DECOMPRESSION Right 09/06/2019   Procedure: RIGHT SHOULDER ARTHROSCOPY WITH EXTENSIVE DEBRIDEMENT, SUBACROMIAL DECOMPRESSION, BICEPS TENOTOMY;  Surgeon: Tarry Kos, MD;  Location: [facility];  Service: Orthopedics;  Laterality: Right;   TENOTOMY ACHILLES TENDON     Patient Active Problem List   Diagnosis Date Noted   Hallux rigidus, right foot 09/15/2021   Spondylolisthesis at L5-S1 level 05/09/2021   Low back pain at multiple sites 02/05/2021   Osteoarthritis    Heart murmur    Exercise-induced asthma    Diabetes mellitus without complication (HCC)    Arthritis    Anemia    Myofascial pain syndrome, cervical 07/28/2019   Lateral epicondylitis of right elbow 07/28/2019   Nontraumatic incomplete tear of right rotator cuff 07/25/2019   Tendinopathy of right biceps tendon 07/25/2019   Erectile dysfunction 07/11/2019   Mild persistent asthma/cough variant asthma 04/13/2019   Allergic rhinitis with a possible nonallergic component 04/13/2019   Acid reflux 04/13/2019   Chronic cough 04/13/2019   Hoarseness 04/13/2019   Cervical radicular pain 01/10/2019   Ulnar neuropathy at elbow of right upper extremity 01/03/2019   Acute bilateral low back pain without sciatica 01/03/2019   Onychomycosis 05/25/2018   Palpitations 07/06/2017   CAD (coronary artery disease) 07/05/2017   Family history of early CAD 07/05/2017   Abnormal findings on diagnostic imaging of cardiovascular system 06/23/2017   Low vitamin D level 04/22/2017   Pain in right ankle and joints of right foot 10/05/2016   Hemarthrosis of right elbow 02/03/2016   Small thenar eminence 02/03/2016   Cervical disc disorder with radiculopathy of cervical region 10/07/2015   Incomplete rotator cuff tear 09/10/2015   Nonischemic cardiomyopathy (HCC) 05/15/2015   Fatigue 05/15/2015   Dyspnea on exertion 05/15/2015   Decreased cardiac ejection fraction 04/02/2015   Strain of latissimus dorsi muscle 01/21/2015   Shoulder bursitis 10/12/2014   Spinal stenosis, lumbar region, without neurogenic claudication 10/12/2014   BPPV (benign paroxysmal positional vertigo) 08/14/2014   Hyperlipidemia 01/19/2014   Anemia,  unspecified 01/19/2014   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   Type 2 diabetes mellitus with hyperglycemia, without long-term current use of insulin (HCC) 12/22/2013   Hypogonadism in male 12/22/2013   Arthritis of hand, left 12/22/2013    PCP: Dr Arva Chafe, DO  REFERRING PROVIDER: Dr Rodolph Bong   REFERRING DIAG:  Spondylolisthesis at L5-S1 level  Chronic bilateral low back pain with right-sided sciatica    Rationale for Evaluation and Treatment: Rehabilitation  THERAPY DIAG:  Spondylolisthesis at L5-S1 level  Chronic bilateral low back pain with right-sided sciatica  Other symptoms and signs involving the musculoskeletal system  Muscle weakness (generalized)  ONSET DATE: 12/12/22  SUBJECTIVE:  SUBJECTIVE STATEMENT:  Patient reports that he is having pain in the R hamstring and calf but some pain extending into the R low back. R hamstring just "shorts out" he will turn and loose strength in the R LE.   Evaluation: Patient reports that he was doing well with minimal to no back pain with yoga; water exercises and some pickle ball until 12/12/22 when he experienced pain in the R hamstrings and calf after doing stretching with yoga. Pain has persisted since that time. He started on prednisone two days ago with good improvement.  He has continued with pain R hamstring and Achilles. Has been seeing the chiropractor once a month but he does not think that is helping. He is working with chair yoga and gentle pool exercise.  PERTINENT HISTORY:  Chronic LBP; bilat hamstring tightness; bilat knees; cervical disc surgery; AODM   PAIN:  Are you having pain? Yes: NPRS scale: 2/10 LB; 6-7/10 R leg  Pain location: LB; R leg Pain description: dull aching in the LB;; sharp  in the leg  Aggravating factors: back - sitting > 10 min; leg - turning foot/twisting  Relieving factors: meds   PRECAUTIONS: None    WEIGHT BEARING RESTRICTIONS: No  FALLS:  Has patient fallen in last 6 months? No  LIVING ENVIRONMENT: Lives with: lives with their spouse Lives in: House/apartment Stairs: Yes: Internal: 24 steps; on right going up and External: 3 steps; on right going up Has following equipment at home: None  OCCUPATION: retired Conservation officer, [employer]; active playing pickle ball    PATIENT GOALS: stop most of the pain; get back to activities   NEXT MD VISIT: 02/09/23  OBJECTIVE:   DIAGNOSTIC FINDINGS:  Xray 12/29/22 - results unavailable   PATIENT SURVEYS:  FOTO 31; goals 48    SENSATION: WFL  MUSCLE LENGTH: Hamstrings: Right 70 deg; Left 65 deg Tight hip flexors bilat   POSTURE: rounded shoulders, forward head, decreased lumbar lordosis, increased thoracic kyphosis, flexed trunk , and weight shift right  PALPATION: Pain with PA mobs lumbar spine; R lumbar paraspinals; posterior hip in gluts and piriformis; hamstrings   LUMBAR ROM:   AROM eval  Flexion 70% pain bilat LB  Extension 10% pain R mid back   Right lateral flexion 65% pull L > R LB  Left lateral flexion 60% pull R LB  Right rotation 20% pain mid back  Left rotation 20% discomfort mid back    (Blank rows = not tested)  LOWER EXTREMITY ROM:     Active Assistive Right eval Left eval  Hip flexion    Hip extension 0 0  Hip abduction    Hip adduction    Hip internal rotation    Hip external rotation    Knee flexion 90 95  Knee extension -5 -5  Ankle dorsiflexion 0 0  Ankle plantarflexion    Ankle inversion    Ankle eversion     (Blank rows = not tested)  LOWER EXTREMITY MMT:    MMT Right eval Left eval  Hip flexion 5- 5  Hip extension 4- 5-  Hip abduction 3+ 4+  Hip adduction    Hip internal rotation    Hip external rotation    Knee flexion 5 5  Knee extension 5 5  Ankle  dorsiflexion    Ankle plantarflexion    Ankle inversion    Ankle eversion     (Blank rows = not tested)  LUMBAR SPECIAL TESTS:  Straight leg raise test: Negative  and Slump test: Negative  FUNCTIONAL TESTS:  SLS: R 2 sec ; L 1 sec  5 times sit to stand: 23.5 sec  GAIT: Distance walked: 40  Assistive device utilized: None Level of assistance: Complete Independence Comments: flexed forward at trunk and hips   [facility] Adult PT Treatment:                                                DATE: 01/06/23 Therapeutic Exercise: Sitting  Hamstring stretch 30 sec x 3 Figure 4 stretch 20 sec x 1  Standing  Gastroc stretch bilat on pro stretch 30 sec x 3 Soleus stretch bilat on pro stretch 30 sec x 3  Supine  Hamstring strength w/ strap 30 sec x 3  ITB stretch with strap 30 sec x 1  Piriformis travell with strap 30 sec x 3  Transverse abdominals pelvic floor and transverse abdominals 10 sec x 5  Manual Therapy: Soft tissue mobilization R posterior thigh Skilled palpation to assess response to manual work and DN Trigger Point Dry-Needling  Treatment instructions: Expect mild to moderate muscle soreness. S/S of pneumothorax if dry needled over a lung field, and to seek immediate medical attention should they occur. Patient verbalized understanding of these instructions and education.  Patient Consent Given: Yes Education handout provided: Previously provided Muscles treated: R hamstring  Electrical stimulation performed: Yes Parameters: mAmp current intensity to patient tolerance  Treatment response/outcome: decreased palpable tightness    [facility] Adult PT Treatment:                                                DATE: 12/30/22 Therapeutic Exercise: Supine  Hamstring strength w/ strap 30 sec x 3  ITB stretch with strap 30 sec x 1  Piriformis travell with strap 30 sec x 3  Transverse abdominals pelvic floor and transverse abdominals 10 sec x 5  Manual Therapy: Consider trial of DN       PATIENT EDUCATION:  Education details: POC; HEP Person educated: Patient Education method: Programmer, multimedia, Demonstration, Tactile cues, Verbal cues, and Handouts Education comprehension: verbalized understanding, returned demonstration, verbal cues required, tactile cues required, and needs further education  HOME EXERCISE PROGRAM: Access Code: [email or web address] URL: [email or web address] Date: 12/30/2022 Prepared by: Corlis Leak  Exercises - Hooklying Hamstring Stretch with Strap  - 2 x daily - 7 x weekly - 1 sets - 3 reps - 30 sec  hold - Supine ITB Stretch with Strap  - 2 x daily - 7 x weekly - 1 sets - 3 reps - 30 sec  hold - Supine Piriformis Stretch with Leg Straight  - 2 x daily - 7 x weekly - 1 sets - 3 reps - 30 sec  hold - Supine Transversus Abdominis Bracing with Pelvic Floor Contraction  - 2 x daily - 7 x weekly - 1 sets - 10 reps - 10sec  hold  ASSESSMENT:  CLINICAL IMPRESSION: Patient returns with continued pain in the R hamstring and calf as well as some pain in the R lumbar area. Reviewed exercises and encouraged consistent exercises at home on a daily basis. Trial of DN with e-stim with dry needling. Tolerated well.    Eval: Patient is a 67 y.o.  male who was seen today for physical therapy evaluation and treatment for  Spondylolisthesis at L5-S1 level and   Chronic bilateral low back pain with right-sided sciatica  .   OBJECTIVE IMPAIRMENTS: Abnormal gait, decreased activity tolerance, decreased endurance, decreased mobility, decreased ROM, hypomobility, increased fascial restrictions, increased muscle spasms, impaired flexibility, improper body mechanics, postural dysfunction, and pain.   GOALS: Goals reviewed with patient? Yes  SHORT TERM GOALS: Target date: 02/10/2023   Independent in initial HEP  Baseline: Goal status: INITIAL  2.  Decrease LB and R LE pain by 25-50% allowing patient to return to normal activities  Baseline:  Goal status:  INITIAL   LONG TERM GOALS: Target date: 03/24/2023   Increase hamstring flexibility to ~ 75 deg bilat  Baseline:  Goal status: INITIAL  2.  Trunk ROM/mobility WFL's and minimal to no pain  Baseline:  Goal status: INITIAL  3.  Increase strength R LE to 4+/5 to 5/5  Baseline:  Goal status: INITIAL  4.  Patient reports return to regular activities including regular exercise - yoga and water exercises Baseline:  Goal status: INITIAL  5.  Independent in HEP including aquatic program as indicated  Baseline:  Goal status: INITIAL  6.  Improve functional limitation score to 48 Baseline:  Goal status: INITIAL  PLAN:  PT FREQUENCY: 2x/week  PT DURATION: 12 weeks  PLANNED INTERVENTIONS: Therapeutic exercises, Therapeutic activity, Neuromuscular re-education, Balance training, Gait training, Patient/Family education, Self Care, Joint mobilization, Aquatic Therapy, Dry Needling, Electrical stimulation, Spinal mobilization, Cryotherapy, Moist heat, Taping, Traction, Ultrasound, Ionotophoresis 4mg /ml Dexamethasone, Manual therapy, and Re-evaluation.  PLAN FOR NEXT SESSION: review and progress exercises; continue efforts to educate patient re back care and body mechanics; manual work, DN, modalities as indicated    [employer], PT 01/06/2023, 2:45 PM

## 2023-01-07 ENCOUNTER — Telehealth: Payer: Self-pay | Admitting: Family Medicine

## 2023-01-07 ENCOUNTER — Other Ambulatory Visit (HOSPITAL_BASED_OUTPATIENT_CLINIC_OR_DEPARTMENT_OTHER): Payer: Self-pay

## 2023-01-07 DIAGNOSIS — S32020A Wedge compression fracture of second lumbar vertebra, initial encounter for closed fracture: Secondary | ICD-10-CM

## 2023-01-07 NOTE — Telephone Encounter (Signed)
MRI ordered for potential compression fracture

## 2023-01-07 NOTE — Progress Notes (Signed)
Lumbar spine x-ray shows diffuse degeneration and a possible compression fracture at L2.  Working to do an MRI to further evaluate this. You do of course have diffuse arthritis in the spine.  MRI should be helpful to understand what is going on here.

## 2023-01-13 ENCOUNTER — Encounter: Payer: Self-pay | Admitting: Rehabilitative and Restorative Service Providers"

## 2023-01-13 ENCOUNTER — Ambulatory Visit: Payer: Commercial Managed Care - PPO | Admitting: Rehabilitative and Restorative Service Providers"

## 2023-01-13 DIAGNOSIS — M6281 Muscle weakness (generalized): Secondary | ICD-10-CM

## 2023-01-13 DIAGNOSIS — M4317 Spondylolisthesis, lumbosacral region: Secondary | ICD-10-CM

## 2023-01-13 DIAGNOSIS — G8929 Other chronic pain: Secondary | ICD-10-CM

## 2023-01-13 DIAGNOSIS — M5441 Lumbago with sciatica, right side: Secondary | ICD-10-CM | POA: Diagnosis not present

## 2023-01-13 DIAGNOSIS — R29898 Other symptoms and signs involving the musculoskeletal system: Secondary | ICD-10-CM | POA: Diagnosis not present

## 2023-01-13 NOTE — Therapy (Signed)
OUTPATIENT PHYSICAL THERAPY THORACOLUMBAR TREATMENT   Patient Name: Reginald Rios MRN: [id number] DOB:1956/04/06, 67 y.o., male Today's Date: 01/13/2023  END OF SESSION:  PT End of Session - 01/13/23 1402     Visit Number 3    Number of Visits 24    Date for PT Re-Evaluation 03/24/23    Authorization Type [employer] $20 copay    PT Start Time 1400    PT Stop Time 1445    PT Time Calculation (min) 45 min    Activity Tolerance Patient tolerated treatment well             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   BPPV (benign paroxysmal positional vertigo) 08/14/2014   CAD (coronary artery disease)    2/19 PCI/DESx1 to Lcx   Cervical disc disorder with radiculopathy of cervical region 10/07/2015   Cervical radicular pain 01/10/2019   Chronic cough 04/13/2019   Decreased cardiac ejection fraction 04/02/2015   Diabetes mellitus without complication (HCC)    Dyspnea on exertion 05/15/2015   Erectile dysfunction 07/11/2019   Exercise-induced asthma    Family history of early CAD 07/05/2017   Fatigue 05/15/2015   Hallux rigidus, right foot 09/15/2021   Heart murmur    Hemarthrosis of right elbow 02/03/2016   Hoarseness 04/13/2019   Hyperlipidemia    family hx of high cholesterol   Hypogonadism in male    Incomplete rotator cuff tear 09/10/2015   Injected 09/10/2015   Lateral epicondylitis of right elbow 07/28/2019   Low back pain at multiple sites 02/05/2021   Low vitamin D level 04/22/2017   Mild persistent asthma/cough variant asthma 04/13/2019   Myofascial pain  syndrome, cervical 07/28/2019   Nonischemic cardiomyopathy (HCC) 05/15/2015   Nontraumatic incomplete tear of right rotator cuff 07/25/2019   Onychomycosis 05/25/2018   Osteoarthritis    Pain in right ankle and joints of right foot 10/05/2016   Palpitations 07/06/2017   Shoulder bursitis 10/12/2014   Injected in 10/12/2014  Repeat 01/21/2015   Small thenar eminence 02/03/2016   Spinal stenosis, lumbar region, without neurogenic claudication 10/12/2014   Spondylolisthesis at L5-S1 level 05/09/2021   Strain of latissimus dorsi muscle 01/21/2015   Tendinopathy of right biceps tendon 07/25/2019   Type 2 diabetes mellitus with hyperglycemia, without long-term current use of insulin (HCC) 12/22/2013   Ulnar neuropathy at elbow of right upper extremity 01/03/2019   Past Surgical History:  Procedure Laterality Date   CORONARY PRESSURE/FFR STUDY N/A 06/23/2017   Procedure: INTRAVASCULAR PRESSURE WIRE/FFR STUDY;  Surgeon: Marykay Lex, MD;  Location: [facility] INVASIVE CV LAB;  Service: Cardiovascular;  Laterality: N/A;   CORONARY STENT INTERVENTION N/A 06/23/2017   Procedure: CORONARY STENT INTERVENTION;  Surgeon: Marykay Lex, MD;  Location: [facility] INVASIVE CV LAB;  Service: Cardiovascular;  Laterality: N/A;   KNEE ARTHROSCOPY     LEFT HEART CATH AND CORONARY ANGIOGRAPHY N/A 06/23/2017   Procedure: LEFT HEART CATH AND CORONARY ANGIOGRAPHY;  Surgeon: Marykay Lex, MD;  Location: [facility] INVASIVE CV LAB;  Service: Cardiovascular;  Laterality: N/A;   SHOULDER ARTHROSCOPY WITH BICEPSTENOTOMY Right 09/06/2019  Procedure: SHOULDER ARTHROSCOPY WITH BICEPSTENOTOMY;  Surgeon: Tarry Kos, MD;  Location: [facility];  Service: Orthopedics;  Laterality: Right;   SHOULDER ARTHROSCOPY WITH SUBACROMIAL DECOMPRESSION Right 09/06/2019   Procedure: RIGHT SHOULDER ARTHROSCOPY WITH EXTENSIVE DEBRIDEMENT, SUBACROMIAL DECOMPRESSION, BICEPS TENOTOMY;  Surgeon: Tarry Kos, MD;  Location: [facility];  Service: Orthopedics;  Laterality: Right;   TENOTOMY ACHILLES TENDON     Patient Active Problem List   Diagnosis Date Noted   Hallux rigidus, right foot 09/15/2021   Spondylolisthesis at L5-S1 level 05/09/2021   Low back pain at multiple sites 02/05/2021   Osteoarthritis    Heart murmur    Exercise-induced asthma    Diabetes mellitus without complication (HCC)    Arthritis    Anemia    Myofascial pain syndrome, cervical 07/28/2019   Lateral epicondylitis of right elbow 07/28/2019   Nontraumatic incomplete tear of right rotator cuff 07/25/2019   Tendinopathy of right biceps tendon 07/25/2019   Erectile dysfunction 07/11/2019   Mild persistent asthma/cough variant asthma 04/13/2019   Allergic rhinitis with a possible nonallergic component 04/13/2019   Acid reflux 04/13/2019   Chronic cough 04/13/2019   Hoarseness 04/13/2019   Cervical radicular pain 01/10/2019   Ulnar neuropathy at elbow of right upper extremity 01/03/2019   Acute bilateral low back pain without sciatica 01/03/2019   Onychomycosis 05/25/2018   Palpitations 07/06/2017   CAD (coronary artery disease) 07/05/2017   Family history of early CAD 07/05/2017   Abnormal findings on diagnostic imaging of cardiovascular system 06/23/2017   Low vitamin D level 04/22/2017   Pain in right ankle and joints of right foot 10/05/2016   Hemarthrosis of right elbow 02/03/2016   Small thenar eminence 02/03/2016   Cervical disc disorder with radiculopathy of cervical region 10/07/2015   Incomplete rotator cuff tear 09/10/2015   Nonischemic cardiomyopathy (HCC) 05/15/2015   Fatigue 05/15/2015   Dyspnea on exertion 05/15/2015   Decreased cardiac ejection fraction 04/02/2015   Strain of latissimus dorsi muscle 01/21/2015   Shoulder bursitis 10/12/2014   Spinal stenosis, lumbar region, without neurogenic claudication 10/12/2014   BPPV (benign paroxysmal positional vertigo) 08/14/2014   Hyperlipidemia 01/19/2014   Anemia,  unspecified 01/19/2014   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   Type 2 diabetes mellitus with hyperglycemia, without long-term current use of insulin (HCC) 12/22/2013   Hypogonadism in male 12/22/2013   Arthritis of hand, left 12/22/2013    PCP: Dr Arva Chafe, DO  REFERRING PROVIDER: Dr Rodolph Bong   REFERRING DIAG:  Spondylolisthesis at L5-S1 level  Chronic bilateral low back pain with right-sided sciatica    Rationale for Evaluation and Treatment: Rehabilitation  THERAPY DIAG:  Spondylolisthesis at L5-S1 level  Chronic bilateral low back pain with right-sided sciatica  Other symptoms and signs involving the musculoskeletal system  Muscle weakness (generalized)  ONSET DATE: 12/12/22  SUBJECTIVE:  SUBJECTIVE STATEMENT:  Patient reports that he is having less pain in the R hamstring and calf but some continued pain extending into the R low back. R hamstring does not feel like it "shorts out" in the R LE in the past week. He still has difficulty in ascending and descending steps. The dry needling especially helped the hip and leg. The exercises release the central back pain but then he feels some pain going over to the R side of the LB. Still has pain in the back especially at night. [facility]   Evaluation: Patient reports that he was doing well with minimal to no back pain with yoga; water exercises and some pickle ball until 12/12/22 when he experienced pain in the R hamstrings and calf after doing stretching with yoga. Pain has persisted since that time. He started on prednisone two days ago with good improvement.  He has continued with pain R hamstring and Achilles. Has been seeing the chiropractor once a month but he does not think  that is helping. He is working with chair yoga and gentle pool exercise.  PERTINENT HISTORY:  Chronic LBP; bilat hamstring tightness; bilat knees; cervical disc surgery; AODM   PAIN:  Are you having pain? Yes: NPRS scale: 5/10 R LB; 8-9/10 R LB at night  Pain location: LB; R leg Pain description: dull aching in the LB;; sharp in the leg  Aggravating factors: back - sitting > 10 min; leg - turning foot/twisting  Relieving factors: meds   PRECAUTIONS: None    WEIGHT BEARING RESTRICTIONS: No  FALLS:  Has patient fallen in last 6 months? No  LIVING ENVIRONMENT: Lives with: lives with their spouse Lives in: House/apartment Stairs: Yes: Internal: 24 steps; on right going up and External: 3 steps; on right going up Has following equipment at home: None  OCCUPATION: retired [employer] officer, [employer]; active playing pickle ball    PATIENT GOALS: stop most of the pain; get back to activities   NEXT MD VISIT: 02/09/23  OBJECTIVE:   DIAGNOSTIC FINDINGS:  Xray 12/29/22 - results unavailable   PATIENT SURVEYS:  FOTO 31; goals 48    SENSATION: WFL  MUSCLE LENGTH: Hamstrings: Right 70 deg; Left 65 deg Tight hip flexors bilat   POSTURE: rounded shoulders, forward head, decreased lumbar lordosis, increased thoracic kyphosis, flexed trunk , and weight shift right  PALPATION: Pain with PA mobs lumbar spine; R lumbar paraspinals; posterior hip in gluts and piriformis; hamstrings   LUMBAR ROM:   AROM eval  Flexion 70% pain bilat LB  Extension 10% pain R mid back   Right lateral flexion 65% pull L > R LB  Left lateral flexion 60% pull R LB  Right rotation 20% pain mid back  Left rotation 20% discomfort mid back    (Blank rows = not tested)  LOWER EXTREMITY ROM:     Active Assistive Right eval Left eval  Hip flexion    Hip extension 0 0  Hip abduction    Hip adduction    Hip internal rotation    Hip external rotation    Knee flexion 90 95  Knee extension -5 -5  Ankle dorsiflexion  0 0  Ankle plantarflexion    Ankle inversion    Ankle eversion     (Blank rows = not tested)  LOWER EXTREMITY MMT:    MMT Right eval Left eval  Hip flexion 5- 5  Hip extension 4- 5-  Hip abduction  3+ 4+  Hip adduction    Hip internal rotation    Hip external rotation    Knee flexion 5 5  Knee extension 5 5  Ankle dorsiflexion    Ankle plantarflexion    Ankle inversion    Ankle eversion     (Blank rows = not tested)  LUMBAR SPECIAL TESTS:  Straight leg raise test: Negative and Slump test: Negative  FUNCTIONAL TESTS:  SLS: R 2 sec ; L 1 sec  5 times sit to stand: 23.5 sec  GAIT: Distance walked: 40  Assistive device utilized: None Level of assistance: Complete Independence Comments: flexed forward at trunk and hips   [facility] Adult PT Treatment:                                                DATE: 01/13/23 Therapeutic Exercise: Nustep L5 x 6 Sitting  Hamstring stretch 30 sec x 3 Figure 4 stretch 20 sec x 1  Standing  Gastroc stretch bilat on pro stretch 30 sec x 2 Soleus stretch bilat on pro stretch 30 sec x 2  Supine  Hamstring strength w/ strap 30 sec x 3  ITB stretch with strap 30 sec x 1  Piriformis travell with strap 30 sec x 3  Transverse abdominals pelvic floor and transverse abdominals 10 sec x 5  Manual Therapy: Soft tissue mobilization R posterior thigh Skilled palpation to assess response to manual work and DN Trigger Point Dry-Needling  Treatment instructions: Expect mild to moderate muscle soreness. S/S of pneumothorax if dry needled over a lung field, and to seek immediate medical attention should they occur. Patient verbalized understanding of these instructions and education.  Patient Consent Given: Yes Education handout provided: Previously provided Muscles treated: R hamstring; inferior iliac crest, QL, lateral gastroc   Electrical stimulation performed: Yes Parameters: mAmp current intensity to patient tolerance  Treatment response/outcome:  decreased palpable tightness  Self care:  Myofacial ball release work R posterior hip/LB in standing back to wall  Discussed method for getting in and out of his car - sitting into the car seat then walking his feet and legs in keeping body straight   [facility] Adult PT Treatment:                                                DATE: 01/06/23 Therapeutic Exercise: Sitting  Hamstring stretch 30 sec x 3 Figure 4 stretch 20 sec x 1  Standing  Gastroc stretch bilat on pro stretch 30 sec x 2 Soleus stretch bilat on pro stretch 30 sec x 2  Supine  Hamstring strength w/ strap 30 sec x 3  ITB stretch with strap 30 sec x 1  Piriformis travell with strap 30 sec x 3  Transverse abdominals pelvic floor and transverse abdominals 10 sec x 5  Manual Therapy: Soft tissue mobilization R posterior thigh Skilled palpation to assess response to manual work and DN Trigger Point Dry-Needling  Treatment instructions: Expect mild to moderate muscle soreness. S/S of pneumothorax if dry needled over a lung field, and to seek immediate medical attention should they occur. Patient verbalized understanding of these instructions and education.  Patient Consent Given: Yes Education handout provided: Previously provided Muscles treated: R  hamstring  Electrical stimulation performed: Yes Parameters: mAmp current intensity to patient tolerance  Treatment response/outcome: decreased palpable tightness    PATIENT EDUCATION:  Education details: POC; HEP Person educated: Patient Education method: Explanation, Demonstration, Tactile cues, Verbal cues, and Handouts Education comprehension: verbalized understanding, returned demonstration, verbal cues required, tactile cues required, and needs further education  HOME EXERCISE PROGRAM: Access Code: [email or web address] URL: [email or web address] Date: 12/30/2022 Prepared by: Corlis Leak  Exercises - Hooklying Hamstring Stretch with Strap  - 2 x daily - 7 x weekly - 1  sets - 3 reps - 30 sec  hold - Supine ITB Stretch with Strap  - 2 x daily - 7 x weekly - 1 sets - 3 reps - 30 sec  hold - Supine Piriformis Stretch with Leg Straight  - 2 x daily - 7 x weekly - 1 sets - 3 reps - 30 sec  hold - Supine Transversus Abdominis Bracing with Pelvic Floor Contraction  - 2 x daily - 7 x weekly - 1 sets - 10 reps - 10sec  hold  ASSESSMENT:  CLINICAL IMPRESSION: Patient reports good improvement in hamstring and hip pain but continued pain in R lumbar area which is worse at night. Discussed influence of functional activities on pain. Reviewed exercises and encouraged consistent exercises at home on a daily basis. Continued DN with e-stim with dry needling. Tolerated well with no pain following today's treatment. Will focus on core strengthening at next visit.    Eval: Patient is a 67 y.o. male who was seen today for physical therapy evaluation and treatment for  Spondylolisthesis at L5-S1 level and   Chronic bilateral low back pain with right-sided sciatica  .   OBJECTIVE IMPAIRMENTS: Abnormal gait, decreased activity tolerance, decreased endurance, decreased mobility, decreased ROM, hypomobility, increased fascial restrictions, increased muscle spasms, impaired flexibility, improper body mechanics, postural dysfunction, and pain.   GOALS: Goals reviewed with patient? Yes  SHORT TERM GOALS: Target date: 02/10/2023   Independent in initial HEP  Baseline: Goal status: INITIAL  2.  Decrease LB and R LE pain by 25-50% allowing patient to return to normal activities  Baseline:  Goal status: INITIAL   LONG TERM GOALS: Target date: 03/24/2023   Increase hamstring flexibility to ~ 75 deg bilat  Baseline:  Goal status: INITIAL  2.  Trunk ROM/mobility WFL's and minimal to no pain  Baseline:  Goal status: INITIAL  3.  Increase strength R LE to 4+/5 to 5/5  Baseline:  Goal status: INITIAL  4.  Patient reports return to regular activities including regular  exercise - yoga and water exercises Baseline:  Goal status: INITIAL  5.  Independent in HEP including aquatic program as indicated  Baseline:  Goal status: INITIAL  6.  Improve functional limitation score to 48 Baseline:  Goal status: INITIAL  PLAN:  PT FREQUENCY: 2x/week  PT DURATION: 12 weeks  PLANNED INTERVENTIONS: Therapeutic exercises, Therapeutic activity, Neuromuscular re-education, Balance training, Gait training, Patient/Family education, Self Care, Joint mobilization, Aquatic Therapy, Dry Needling, Electrical stimulation, Spinal mobilization, Cryotherapy, Moist heat, Taping, Traction, Ultrasound, Ionotophoresis 4mg /ml Dexamethasone, Manual therapy, and Re-evaluation.  PLAN FOR NEXT SESSION: review and progress exercises; continue efforts to educate patient re back care and body mechanics; manual work, DN, modalities as indicated    [employer], PT 01/13/2023, 2:03 PM

## 2023-01-15 ENCOUNTER — Ambulatory Visit: Payer: Commercial Managed Care - PPO

## 2023-01-15 DIAGNOSIS — R29898 Other symptoms and signs involving the musculoskeletal system: Secondary | ICD-10-CM | POA: Diagnosis not present

## 2023-01-15 DIAGNOSIS — G8929 Other chronic pain: Secondary | ICD-10-CM

## 2023-01-15 DIAGNOSIS — M6281 Muscle weakness (generalized): Secondary | ICD-10-CM

## 2023-01-15 DIAGNOSIS — M4317 Spondylolisthesis, lumbosacral region: Secondary | ICD-10-CM | POA: Diagnosis not present

## 2023-01-15 DIAGNOSIS — M5441 Lumbago with sciatica, right side: Secondary | ICD-10-CM | POA: Diagnosis not present

## 2023-01-15 NOTE — Therapy (Signed)
OUTPATIENT PHYSICAL THERAPY THORACOLUMBAR TREATMENT   Patient Name: Reginald Rios MRN: [id number] DOB:[birth year], 67 y.o., male Today's Date: 01/15/2023  END OF SESSION:  PT End of Session - 01/15/23 1015     Visit Number 4    Number of Visits 24    Date for PT Re-Evaluation 03/24/23    Authorization Type [employer] $20 copay    PT Start Time 1016    PT Stop Time 1103    PT Time Calculation (min) 47 min    Activity Tolerance Patient tolerated treatment well    Behavior During Therapy [facility] for tasks assessed/performed             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   BPPV (benign paroxysmal positional vertigo) 08/14/2014   CAD (coronary artery disease)    2/19 PCI/DESx1 to Lcx   Cervical disc disorder with radiculopathy of cervical region 10/07/2015   Cervical radicular pain 01/10/2019   Chronic cough 04/13/2019   Decreased cardiac ejection fraction 04/02/2015   Diabetes mellitus without complication (HCC)    Dyspnea on exertion 05/15/2015   Erectile dysfunction 07/11/2019   Exercise-induced asthma    Family history of early CAD 07/05/2017   Fatigue 05/15/2015   Hallux rigidus, right foot 09/15/2021   Heart murmur    Hemarthrosis of right elbow 02/03/2016   Hoarseness 04/13/2019   Hyperlipidemia    family hx of high cholesterol   Hypogonadism in male    Incomplete rotator cuff tear 09/10/2015   Injected 09/10/2015   Lateral epicondylitis of right elbow 07/28/2019   Low back pain at multiple sites 02/05/2021   Low vitamin D level 04/22/2017   Mild  persistent asthma/cough variant asthma 04/13/2019   Myofascial pain syndrome, cervical 07/28/2019   Nonischemic cardiomyopathy (HCC) 05/15/2015   Nontraumatic incomplete tear of right rotator cuff 07/25/2019   Onychomycosis 05/25/2018   Osteoarthritis    Pain in right ankle and joints of right foot 10/05/2016   Palpitations 07/06/2017   Shoulder bursitis 10/12/2014   Injected in 10/12/2014  Repeat 01/21/2015   Small thenar eminence 02/03/2016   Spinal stenosis, lumbar region, without neurogenic claudication 10/12/2014   Spondylolisthesis at L5-S1 level 05/09/2021   Strain of latissimus dorsi muscle 01/21/2015   Tendinopathy of right biceps tendon 07/25/2019   Type 2 diabetes mellitus with hyperglycemia, without long-term current use of insulin (HCC) 12/22/2013   Ulnar neuropathy at elbow of right upper extremity 01/03/2019   Past Surgical History:  Procedure Laterality Date   CORONARY PRESSURE/FFR STUDY N/A 06/23/2017   Procedure: INTRAVASCULAR PRESSURE WIRE/FFR STUDY;  Surgeon: Marykay Lex, MD;  Location: [facility] INVASIVE CV LAB;  Service: Cardiovascular;  Laterality: N/A;   CORONARY STENT INTERVENTION N/A 06/23/2017   Procedure: CORONARY STENT INTERVENTION;  Surgeon: Marykay Lex, MD;  Location: [facility] INVASIVE CV LAB;  Service: Cardiovascular;  Laterality: N/A;   KNEE ARTHROSCOPY     LEFT HEART CATH AND CORONARY ANGIOGRAPHY N/A 06/23/2017   Procedure: LEFT HEART CATH AND CORONARY ANGIOGRAPHY;  Surgeon: Marykay Lex, MD;  Location: [facility] INVASIVE CV LAB;  Service: Cardiovascular;  Laterality: N/A;   SHOULDER ARTHROSCOPY WITH BICEPSTENOTOMY Right 09/06/2019   Procedure: SHOULDER ARTHROSCOPY WITH BICEPSTENOTOMY;  Surgeon: Tarry Kos, MD;  Location: [facility];  Service: Orthopedics;  Laterality: Right;   SHOULDER ARTHROSCOPY WITH SUBACROMIAL DECOMPRESSION Right 09/06/2019   Procedure: RIGHT SHOULDER ARTHROSCOPY WITH EXTENSIVE DEBRIDEMENT, SUBACROMIAL DECOMPRESSION, BICEPS  TENOTOMY;  Surgeon: Tarry Kos, MD;  Location: [facility];  Service: Orthopedics;  Laterality: Right;   TENOTOMY ACHILLES TENDON     Patient Active Problem List   Diagnosis Date Noted   Hallux rigidus, right foot 09/15/2021   Spondylolisthesis at L5-S1 level 05/09/2021   Low back pain at multiple sites 02/05/2021   Osteoarthritis    Heart murmur    Exercise-induced asthma    Diabetes mellitus without complication (HCC)    Arthritis    Anemia    Myofascial pain syndrome, cervical 07/28/2019   Lateral epicondylitis of right elbow 07/28/2019   Nontraumatic incomplete tear of right rotator cuff 07/25/2019   Tendinopathy of right biceps tendon 07/25/2019   Erectile dysfunction 07/11/2019   Mild persistent asthma/cough variant asthma 04/13/2019   Allergic rhinitis with a possible nonallergic component 04/13/2019   Acid reflux 04/13/2019   Chronic cough 04/13/2019   Hoarseness 04/13/2019   Cervical radicular pain 01/10/2019   Ulnar neuropathy at elbow of right upper extremity 01/03/2019   Acute bilateral low back pain without sciatica 01/03/2019   Onychomycosis 05/25/2018   Palpitations 07/06/2017   CAD (coronary artery disease) 07/05/2017   Family history of early CAD 07/05/2017   Abnormal findings on diagnostic imaging of cardiovascular system 06/23/2017   Low vitamin D level 04/22/2017   Pain in right ankle and joints of right foot 10/05/2016   Hemarthrosis of right elbow 02/03/2016   Small thenar eminence 02/03/2016   Cervical disc disorder with radiculopathy of cervical region 10/07/2015   Incomplete rotator cuff tear 09/10/2015   Nonischemic cardiomyopathy (HCC) 05/15/2015   Fatigue 05/15/2015   Dyspnea on exertion 05/15/2015   Decreased cardiac ejection fraction 04/02/2015   Strain of latissimus dorsi muscle 01/21/2015   Shoulder bursitis 10/12/2014   Spinal stenosis, lumbar region, without neurogenic claudication 10/12/2014   BPPV (benign paroxysmal  positional vertigo) 08/14/2014   Hyperlipidemia 01/19/2014   Anemia, unspecified 01/19/2014   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   Type 2 diabetes mellitus with hyperglycemia, without long-term current use of insulin (HCC) 12/22/2013   Hypogonadism in male 12/22/2013   Arthritis of hand, left 12/22/2013    PCP: Dr Arva Chafe, DO  REFERRING PROVIDER: Dr Rodolph Bong   REFERRING DIAG:  Spondylolisthesis at L5-S1 level  Chronic bilateral low back pain with right-sided sciatica    Rationale for Evaluation and Treatment: Rehabilitation  THERAPY DIAG:  Spondylolisthesis at L5-S1 level  Chronic bilateral low back pain with right-sided sciatica  Other symptoms and signs involving the musculoskeletal system  Muscle weakness (generalized)  ONSET DATE: 12/12/22  SUBJECTIVE:  SUBJECTIVE STATEMENT:  Patient reports he has been getting a feeling that his R HS will "short out" ascending stairs. Patient states he did a Research scientist (life sciences) yoga class while traveling and his R HS flared up. Patient states he has felt a significant decrease in low back pain with DN and pillow between knees while sleeping.  Evaluation: Patient reports that he was doing well with minimal to no back pain with yoga; water exercises and some pickle ball until 12/12/22 when he experienced pain in the R hamstrings and calf after doing stretching with yoga. Pain has persisted since that time. He started on prednisone two days ago with good improvement.  He has continued with pain R hamstring and Achilles. Has been seeing the chiropractor once a month but he does not think that is helping. He is working with chair yoga and gentle pool exercise.  PERTINENT HISTORY:  Chronic LBP; bilat hamstring tightness; bilat knees;  cervical disc surgery; AODM   PAIN:  Are you having pain? Yes: NPRS scale: 5/10 R LB; 8-9/10 R LB at night  Pain location: LB; R leg Pain description: dull aching in the LB;; sharp in the leg  Aggravating factors: back - sitting > 10 min; leg - turning foot/twisting  Relieving factors: meds   PRECAUTIONS: None    WEIGHT BEARING RESTRICTIONS: No  FALLS:  Has patient fallen in last 6 months? No  LIVING ENVIRONMENT: Lives with: lives with their spouse Lives in: House/apartment Stairs: Yes: Internal: 24 steps; on right going up and External: 3 steps; on right going up Has following equipment at home: None  OCCUPATION: retired Conservation officer, nature; active playing pickle ball    PATIENT GOALS: stop most of the pain; get back to activities   NEXT MD VISIT: 02/09/23  OBJECTIVE:   DIAGNOSTIC FINDINGS:  Xray 12/29/22 - results unavailable   PATIENT SURVEYS:  FOTO 31; goals 48    SENSATION: WFL  MUSCLE LENGTH: Hamstrings: Right 70 deg; Left 65 deg Tight hip flexors bilat   POSTURE: rounded shoulders, forward head, decreased lumbar lordosis, increased thoracic kyphosis, flexed trunk , and weight shift right  PALPATION: Pain with PA mobs lumbar spine; R lumbar paraspinals; posterior hip in gluts and piriformis; hamstrings   LUMBAR ROM:   AROM eval  Flexion 70% pain bilat LB  Extension 10% pain R mid back   Right lateral flexion 65% pull L > R LB  Left lateral flexion 60% pull R LB  Right rotation 20% pain mid back  Left rotation 20% discomfort mid back    (Blank rows = not tested)  LOWER EXTREMITY ROM:     Active Assistive Right eval Left eval  Hip flexion    Hip extension 0 0  Hip abduction    Hip adduction    Hip internal rotation    Hip external rotation    Knee flexion 90 95  Knee extension -5 -5  Ankle dorsiflexion 0 0  Ankle plantarflexion    Ankle inversion    Ankle eversion     (Blank rows = not tested)  LOWER EXTREMITY MMT:    MMT Right eval  Left eval  Hip flexion 5- 5  Hip extension 4- 5-  Hip abduction 3+ 4+  Hip adduction    Hip internal rotation    Hip external rotation    Knee flexion 5 5  Knee extension 5 5  Ankle dorsiflexion    Ankle plantarflexion    Ankle inversion    Ankle eversion     (  Blank rows = not tested)  LUMBAR SPECIAL TESTS:  Straight leg raise test: Negative and Slump test: Negative  FUNCTIONAL TESTS:  SLS: R 2 sec ; L 1 sec  5 times sit to stand: 23.5 sec  GAIT: Distance walked: 40  Assistive device utilized: None Level of assistance: Complete Independence Comments: flexed forward at trunk and hips    [facility] Adult PT Treatment:                                                DATE: 01/15/2023 Therapeutic Exercise: NuStep L5 x 5 min Seated R HS stretch (foot propped on stool) 5x10" Supine: Sciatic nerve glide x10  Peroneal nerve glide x10 Hooklying hip add iso bal squeeze 5x5" Bridges + ball squeeze x10 --> cueing for abdominal bracing Figure 4 stretch --> pulling knee to opp shoulder Modified Travell piriformis stretch (R) Seated hip IR AROM 2x10 (R) Step ups 4" --> 6" step    [facility] Adult PT Treatment:                                                DATE: 01/13/23 Therapeutic Exercise: Nustep L5 x 6 Sitting  Hamstring stretch 30 sec x 3 Figure 4 stretch 20 sec x 1  Standing  Gastroc stretch bilat on pro stretch 30 sec x 2 Soleus stretch bilat on pro stretch 30 sec x 2  Supine  Hamstring strength w/ strap 30 sec x 3  ITB stretch with strap 30 sec x 1  Piriformis travell with strap 30 sec x 3  Transverse abdominals pelvic floor and transverse abdominals 10 sec x 5  Manual Therapy: Soft tissue mobilization R posterior thigh Skilled palpation to assess response to manual work and DN Trigger Point Dry-Needling  Treatment instructions: Expect mild to moderate muscle soreness. S/S of pneumothorax if dry needled over a lung field, and to seek immediate medical attention should they  occur. Patient verbalized understanding of these instructions and education.  Patient Consent Given: Yes Education handout provided: Previously provided Muscles treated: R hamstring; inferior iliac crest, QL, lateral gastroc   Electrical stimulation performed: Yes Parameters: mAmp current intensity to patient tolerance  Treatment response/outcome: decreased palpable tightness  Self care:  Myofacial ball release work R posterior hip/LB in standing back to wall  Discussed method for getting in and out of his car - sitting into the car seat then walking his feet and legs in keeping body straight   [facility] Adult PT Treatment:                                                DATE: 01/06/23 Therapeutic Exercise: Sitting  Hamstring stretch 30 sec x 3 Figure 4 stretch 20 sec x 1  Standing  Gastroc stretch bilat on pro stretch 30 sec x 2 Soleus stretch bilat on pro stretch 30 sec x 2  Supine  Hamstring strength w/ strap 30 sec x 3  ITB stretch with strap 30 sec x 1  Piriformis travell with strap 30 sec x 3  Transverse abdominals pelvic floor and transverse abdominals  10 sec x 5  Manual Therapy: Soft tissue mobilization R posterior thigh Skilled palpation to assess response to manual work and DN Trigger Point Dry-Needling  Treatment instructions: Expect mild to moderate muscle soreness. S/S of pneumothorax if dry needled over a lung field, and to seek immediate medical attention should they occur. Patient verbalized understanding of these instructions and education.  Patient Consent Given: Yes Education handout provided: Previously provided Muscles treated: R hamstring  Electrical stimulation performed: Yes Parameters: mAmp current intensity to patient tolerance  Treatment response/outcome: decreased palpable tightness    PATIENT EDUCATION:  Education details: POC; HEP Person educated: Patient Education method: Programmer, multimedia, Demonstration, Actor cues, Verbal cues, and Handouts Education  comprehension: verbalized understanding, returned demonstration, verbal cues required, tactile cues required, and needs further education  HOME EXERCISE PROGRAM: Access Code: [email or web address] URL: [email or web address] Date: 01/15/2023 Prepared by: Carlynn Herald  Exercises - Hooklying Hamstring Stretch with Strap  - 2 x daily - 7 x weekly - 1 sets - 3 reps - 30 sec  hold - Supine ITB Stretch with Strap  - 2 x daily - 7 x weekly - 1 sets - 3 reps - 30 sec  hold - Supine Piriformis Stretch with Leg Straight  - 2 x daily - 7 x weekly - 1 sets - 3 reps - 30 sec  hold - Supine Transversus Abdominis Bracing with Pelvic Floor Contraction  - 2 x daily - 7 x weekly - 1 sets - 10 reps - 10sec  hold - Supine Peroneal Nerve Glide  - 1 x daily - 7 x weekly - 3 sets - 10 reps - Supine Sciatic Nerve Glide  - 1 x daily - 7 x weekly - 3 sets - 10 reps - Supine Bridge with Mini Swiss Ball Between Knees  - 1 x daily - 7 x weekly - 3 sets - 10 reps - Seated Self Great Toe Stretch  - 1 x daily - 7 x weekly - 3 sets - 10 reps - Seated Hip Internal Rotation AROM  - 1 x daily - 7 x weekly - 3 sets - 10 reps  ASSESSMENT:  CLINICAL IMPRESSION: Cueing improved TA activation during bridges. Incorporated hip add activation for improved hip stabilization during exercises. Noted fatigue in R hip with AROM internal rotation. Patient able to tolerate step ups on 4" and 6" steps with no exacerbation of symptoms in R LE.   Eval: Patient is a 67 y.o. male who was seen today for physical therapy evaluation and treatment for  Spondylolisthesis at L5-S1 level and   Chronic bilateral low back pain with right-sided sciatica  .   OBJECTIVE IMPAIRMENTS: Abnormal gait, decreased activity tolerance, decreased endurance, decreased mobility, decreased ROM, hypomobility, increased fascial restrictions, increased muscle spasms, impaired flexibility, improper body mechanics, postural dysfunction, and pain.   GOALS: Goals  reviewed with patient? Yes  SHORT TERM GOALS: Target date: 02/10/2023  Independent in initial HEP  Baseline: Goal status: INITIAL  2.  Decrease LB and R LE pain by 25-50% allowing patient to return to normal activities  Baseline:  Goal status: INITIAL   LONG TERM GOALS: Target date: 03/24/2023  Increase hamstring flexibility to ~ 75 deg bilat  Baseline:  Goal status: INITIAL  2.  Trunk ROM/mobility WFL's and minimal to no pain  Baseline:  Goal status: INITIAL  3.  Increase strength R LE to 4+/5 to 5/5  Baseline:  Goal status: INITIAL  4.  Patient reports return to regular activities including regular  exercise - yoga and water exercises Baseline:  Goal status: INITIAL  5.  Independent in HEP including aquatic program as indicated  Baseline:  Goal status: INITIAL  6.  Improve functional limitation score to 48 Baseline:  Goal status: INITIAL  PLAN:  PT FREQUENCY: 2x/week  PT DURATION: 12 weeks  PLANNED INTERVENTIONS: Therapeutic exercises, Therapeutic activity, Neuromuscular re-education, Balance training, Gait training, Patient/Family education, Self Care, Joint mobilization, Aquatic Therapy, Dry Needling, Electrical stimulation, Spinal mobilization, Cryotherapy, Moist heat, Taping, Traction, Ultrasound, Ionotophoresis 4mg /ml Dexamethasone, Manual therapy, and Re-evaluation.  PLAN FOR NEXT SESSION: Progress exercises; continue efforts to educate patient re back care and body mechanics; manual work, DN, modalities as indicated    Sanjuana Mae, PTA 01/15/2023, 11:04 AM

## 2023-01-18 ENCOUNTER — Ambulatory Visit: Payer: Commercial Managed Care - PPO

## 2023-01-18 DIAGNOSIS — M4317 Spondylolisthesis, lumbosacral region: Secondary | ICD-10-CM

## 2023-01-18 DIAGNOSIS — M5441 Lumbago with sciatica, right side: Secondary | ICD-10-CM | POA: Diagnosis not present

## 2023-01-18 DIAGNOSIS — M6281 Muscle weakness (generalized): Secondary | ICD-10-CM | POA: Diagnosis not present

## 2023-01-18 DIAGNOSIS — R29898 Other symptoms and signs involving the musculoskeletal system: Secondary | ICD-10-CM | POA: Diagnosis not present

## 2023-01-18 DIAGNOSIS — G8929 Other chronic pain: Secondary | ICD-10-CM

## 2023-01-18 NOTE — Therapy (Signed)
OUTPATIENT PHYSICAL THERAPY THORACOLUMBAR TREATMENT   Patient Name: Reginald Rios MRN: [id number] DOB:May 19, 1955, 67 y.o., male Today's Date: 01/18/2023  END OF SESSION:  PT End of Session - 01/18/23 1448     Visit Number 5    Number of Visits 24    Date for PT Re-Evaluation 03/24/23    Authorization Type [employer] $20 copay    PT Start Time 1450    PT Stop Time 1535    PT Time Calculation (min) 45 min    Activity Tolerance Patient tolerated treatment well    Behavior During Therapy [facility] for tasks assessed/performed             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   BPPV (benign paroxysmal positional vertigo) 08/14/2014   CAD (coronary artery disease)    2/19 PCI/DESx1 to Lcx   Cervical disc disorder with radiculopathy of cervical region 10/07/2015   Cervical radicular pain 01/10/2019   Chronic cough 04/13/2019   Decreased cardiac ejection fraction 04/02/2015   Diabetes mellitus without complication (HCC)    Dyspnea on exertion 05/15/2015   Erectile dysfunction 07/11/2019   Exercise-induced asthma    Family history of early CAD 07/05/2017   Fatigue 05/15/2015   Hallux rigidus, right foot 09/15/2021   Heart murmur    Hemarthrosis of right elbow 02/03/2016   Hoarseness 04/13/2019   Hyperlipidemia    family hx of high cholesterol   Hypogonadism in male    Incomplete rotator cuff tear 09/10/2015   Injected 09/10/2015   Lateral epicondylitis of right elbow 07/28/2019   Low back pain at multiple sites 02/05/2021   Low vitamin D level 04/22/2017   Mild  persistent asthma/cough variant asthma 04/13/2019   Myofascial pain syndrome, cervical 07/28/2019   Nonischemic cardiomyopathy (HCC) 05/15/2015   Nontraumatic incomplete tear of right rotator cuff 07/25/2019   Onychomycosis 05/25/2018   Osteoarthritis    Pain in right ankle and joints of right foot 10/05/2016   Palpitations 07/06/2017   Shoulder bursitis 10/12/2014   Injected in 10/12/2014  Repeat 01/21/2015   Small thenar eminence 02/03/2016   Spinal stenosis, lumbar region, without neurogenic claudication 10/12/2014   Spondylolisthesis at L5-S1 level 05/09/2021   Strain of latissimus dorsi muscle 01/21/2015   Tendinopathy of right biceps tendon 07/25/2019   Type 2 diabetes mellitus with hyperglycemia, without long-term current use of insulin (HCC) 12/22/2013   Ulnar neuropathy at elbow of right upper extremity 01/03/2019   Past Surgical History:  Procedure Laterality Date   CORONARY PRESSURE/FFR STUDY N/A 06/23/2017   Procedure: INTRAVASCULAR PRESSURE WIRE/FFR STUDY;  Surgeon: Marykay Lex, MD;  Location: [facility] INVASIVE CV LAB;  Service: Cardiovascular;  Laterality: N/A;   CORONARY STENT INTERVENTION N/A 06/23/2017   Procedure: CORONARY STENT INTERVENTION;  Surgeon: Marykay Lex, MD;  Location: [facility] INVASIVE CV LAB;  Service: Cardiovascular;  Laterality: N/A;   KNEE ARTHROSCOPY     LEFT HEART CATH AND CORONARY ANGIOGRAPHY N/A 06/23/2017   Procedure: LEFT HEART CATH AND CORONARY ANGIOGRAPHY;  Surgeon: Marykay Lex, MD;  Location: [facility] INVASIVE CV LAB;  Service: Cardiovascular;  Laterality: N/A;   SHOULDER ARTHROSCOPY WITH BICEPSTENOTOMY Right 09/06/2019   Procedure: SHOULDER ARTHROSCOPY WITH BICEPSTENOTOMY;  Surgeon: Tarry Kos, MD;  Location: [facility];  Service: Orthopedics;  Laterality: Right;   SHOULDER ARTHROSCOPY WITH SUBACROMIAL DECOMPRESSION Right 09/06/2019   Procedure: RIGHT SHOULDER ARTHROSCOPY WITH EXTENSIVE DEBRIDEMENT, SUBACROMIAL DECOMPRESSION, BICEPS  TENOTOMY;  Surgeon: Tarry Kos, MD;  Location: [facility];  Service: Orthopedics;  Laterality: Right;   TENOTOMY ACHILLES TENDON     Patient Active Problem List   Diagnosis Date Noted   Hallux rigidus, right foot 09/15/2021   Spondylolisthesis at L5-S1 level 05/09/2021   Low back pain at multiple sites 02/05/2021   Osteoarthritis    Heart murmur    Exercise-induced asthma    Diabetes mellitus without complication (HCC)    Arthritis    Anemia    Myofascial pain syndrome, cervical 07/28/2019   Lateral epicondylitis of right elbow 07/28/2019   Nontraumatic incomplete tear of right rotator cuff 07/25/2019   Tendinopathy of right biceps tendon 07/25/2019   Erectile dysfunction 07/11/2019   Mild persistent asthma/cough variant asthma 04/13/2019   Allergic rhinitis with a possible nonallergic component 04/13/2019   Acid reflux 04/13/2019   Chronic cough 04/13/2019   Hoarseness 04/13/2019   Cervical radicular pain 01/10/2019   Ulnar neuropathy at elbow of right upper extremity 01/03/2019   Acute bilateral low back pain without sciatica 01/03/2019   Onychomycosis 05/25/2018   Palpitations 07/06/2017   CAD (coronary artery disease) 07/05/2017   Family history of early CAD 07/05/2017   Abnormal findings on diagnostic imaging of cardiovascular system 06/23/2017   Low vitamin D level 04/22/2017   Pain in right ankle and joints of right foot 10/05/2016   Hemarthrosis of right elbow 02/03/2016   Small thenar eminence 02/03/2016   Cervical disc disorder with radiculopathy of cervical region 10/07/2015   Incomplete rotator cuff tear 09/10/2015   Nonischemic cardiomyopathy (HCC) 05/15/2015   Fatigue 05/15/2015   Dyspnea on exertion 05/15/2015   Decreased cardiac ejection fraction 04/02/2015   Strain of latissimus dorsi muscle 01/21/2015   Shoulder bursitis 10/12/2014   Spinal stenosis, lumbar region, without neurogenic claudication 10/12/2014   BPPV (benign paroxysmal  positional vertigo) 08/14/2014   Hyperlipidemia 01/19/2014   Anemia, unspecified 01/19/2014   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   Type 2 diabetes mellitus with hyperglycemia, without long-term current use of insulin (HCC) 12/22/2013   Hypogonadism in male 12/22/2013   Arthritis of hand, left 12/22/2013    PCP: Dr Arva Chafe, DO  REFERRING PROVIDER: Dr Rodolph Bong   REFERRING DIAG:  Spondylolisthesis at L5-S1 level  Chronic bilateral low back pain with right-sided sciatica    Rationale for Evaluation and Treatment: Rehabilitation  THERAPY DIAG:  Spondylolisthesis at L5-S1 level  Chronic bilateral low back pain with right-sided sciatica  Other symptoms and signs involving the musculoskeletal system  Muscle weakness (generalized)  ONSET DATE: 12/12/22  SUBJECTIVE:  SUBJECTIVE STATEMENT:  Patient reports he has not had the "shorting out" feeling in R HS; states he is feeling tight in low back and piriformis today. Patient states he is having pain again going up stairs.  Evaluation: Patient reports that he was doing well with minimal to no back pain with yoga; water exercises and some pickle ball until 12/12/22 when he experienced pain in the R hamstrings and calf after doing stretching with yoga. Pain has persisted since that time. He started on prednisone two days ago with good improvement.  He has continued with pain R hamstring and Achilles. Has been seeing the chiropractor once a month but he does not think that is helping. He is working with chair yoga and gentle pool exercise.  PERTINENT HISTORY:  Chronic LBP; bilat hamstring tightness; bilat knees; cervical disc surgery; AODM   PAIN:  Are you having pain? Yes: NPRS scale: 5/10 R LB; 8-9/10 R LB at night   Pain location: LB; R leg Pain description: dull aching in the LB;; sharp in the leg  Aggravating factors: back - sitting > 10 min; leg - turning foot/twisting  Relieving factors: meds   PRECAUTIONS: None    WEIGHT BEARING RESTRICTIONS: No  FALLS:  Has patient fallen in last 6 months? No  LIVING ENVIRONMENT: Lives with: lives with their spouse Lives in: House/apartment Stairs: Yes: Internal: 24 steps; on right going up and External: 3 steps; on right going up Has following equipment at home: None  OCCUPATION: retired Conservation officer, [employer]; active playing pickle ball    PATIENT GOALS: stop most of the pain; get back to activities   NEXT MD VISIT: 02/09/23  OBJECTIVE:   DIAGNOSTIC FINDINGS:  Xray 12/29/22 - results unavailable   PATIENT SURVEYS:  FOTO 31; goals 48    SENSATION: WFL  MUSCLE LENGTH: Hamstrings: Right 70 deg; Left 65 deg Tight hip flexors bilat   POSTURE: rounded shoulders, forward head, decreased lumbar lordosis, increased thoracic kyphosis, flexed trunk , and weight shift right  PALPATION: Pain with PA mobs lumbar spine; R lumbar paraspinals; posterior hip in gluts and piriformis; hamstrings   LUMBAR ROM:   AROM eval  Flexion 70% pain bilat LB  Extension 10% pain R mid back   Right lateral flexion 65% pull L > R LB  Left lateral flexion 60% pull R LB  Right rotation 20% pain mid back  Left rotation 20% discomfort mid back    (Blank rows = not tested)  LOWER EXTREMITY ROM:     Active Assistive Right eval Left eval  Hip flexion    Hip extension 0 0  Hip abduction    Hip adduction    Hip internal rotation    Hip external rotation    Knee flexion 90 95  Knee extension -5 -5  Ankle dorsiflexion 0 0  Ankle plantarflexion    Ankle inversion    Ankle eversion     (Blank rows = not tested)  LOWER EXTREMITY MMT:    MMT Right eval Left eval  Hip flexion 5- 5  Hip extension 4- 5-  Hip abduction 3+ 4+  Hip adduction    Hip internal rotation     Hip external rotation    Knee flexion 5 5  Knee extension 5 5  Ankle dorsiflexion    Ankle plantarflexion    Ankle inversion    Ankle eversion     (Blank rows = not tested)  LUMBAR SPECIAL TESTS:  Straight leg raise test: Negative and  Slump test: Negative  FUNCTIONAL TESTS:  SLS: R 2 sec ; L 1 sec  5 times sit to stand: 23.5 sec  GAIT: Distance walked: 40  Assistive device utilized: None Level of assistance: Complete Independence Comments: flexed forward at trunk and hips    [facility] Adult PT Treatment:                                                DATE: 01/18/2023 Therapeutic Exercise: NuStep L6 x 6 min Supine: Sciatic nerve glide x10  Peroneal nerve glide x10 Bridges + ball squeeze 2x10 Modified figure 4 stretch --> pulling knee to opp shoulder 3x30" (B) S/L clamshell RTB to fatigue (30 reps) S/L hip ext in abd x10 Seated hip IR AROM with slider 2x10 (R), x10 (L) Standing forward bend with legs crossed (ITB) Step ups 6" step front Lateral step up 4"     [facility] Adult PT Treatment:                                                DATE: 01/15/2023 Therapeutic Exercise: NuStep L5 x 5 min Seated R HS stretch (foot propped on stool) 5x10" Supine: Sciatic nerve glide x10  Peroneal nerve glide x10 Hooklying hip add iso bal squeeze 5x5" Bridges + ball squeeze x10 --> cueing for abdominal bracing Figure 4 stretch --> pulling knee to opp shoulder Modified Travell piriformis stretch (R) Seated hip IR AROM 2x10 (R) Step ups 4" --> 6" step    [facility] Adult PT Treatment:                                                DATE: 01/13/23 Therapeutic Exercise: Nustep L5 x 6 Sitting  Hamstring stretch 30 sec x 3 Figure 4 stretch 20 sec x 1  Standing  Gastroc stretch bilat on pro stretch 30 sec x 2 Soleus stretch bilat on pro stretch 30 sec x 2  Supine  Hamstring strength w/ strap 30 sec x 3  ITB stretch with strap 30 sec x 1  Piriformis travell with strap 30 sec x 3  Transverse  abdominals pelvic floor and transverse abdominals 10 sec x 5  Manual Therapy: Soft tissue mobilization R posterior thigh Skilled palpation to assess response to manual work and DN Trigger Point Dry-Needling  Treatment instructions: Expect mild to moderate muscle soreness. S/S of pneumothorax if dry needled over a lung field, and to seek immediate medical attention should they occur. Patient verbalized understanding of these instructions and education.  Patient Consent Given: Yes Education handout provided: Previously provided Muscles treated: R hamstring; inferior iliac crest, QL, lateral gastroc   Electrical stimulation performed: Yes Parameters: mAmp current intensity to patient tolerance  Treatment response/outcome: decreased palpable tightness  Self care:  Myofacial ball release work R posterior hip/LB in standing back to wall  Discussed method for getting in and out of his car - sitting into the car seat then walking his feet and legs in keeping body straight    PATIENT EDUCATION:  Education details: POC; HEP Person educated: Patient Education method: Programmer, multimedia, Facilities manager, Actor  cues, Verbal cues, and Handouts Education comprehension: verbalized understanding, returned demonstration, verbal cues required, tactile cues required, and needs further education  HOME EXERCISE PROGRAM: Access Code: [email or web address] URL: [email or web address] Date: 01/15/2023 Prepared by: Carlynn Herald  Exercises - Hooklying Hamstring Stretch with Strap  - 2 x daily - 7 x weekly - 1 sets - 3 reps - 30 sec  hold - Supine ITB Stretch with Strap  - 2 x daily - 7 x weekly - 1 sets - 3 reps - 30 sec  hold - Supine Piriformis Stretch with Leg Straight  - 2 x daily - 7 x weekly - 1 sets - 3 reps - 30 sec  hold - Supine Transversus Abdominis Bracing with Pelvic Floor Contraction  - 2 x daily - 7 x weekly - 1 sets - 10 reps - 10sec  hold - Supine Peroneal Nerve Glide  - 1 x daily - 7 x weekly - 3  sets - 10 reps - Supine Sciatic Nerve Glide  - 1 x daily - 7 x weekly - 3 sets - 10 reps - Supine Bridge with Mini Swiss Ball Between Knees  - 1 x daily - 7 x weekly - 3 sets - 10 reps - Seated Self Great Toe Stretch  - 1 x daily - 7 x weekly - 3 sets - 10 reps - Seated Hip Internal Rotation AROM  - 1 x daily - 7 x weekly - 3 sets - 10 reps  ASSESSMENT:  CLINICAL IMPRESSION: Hip strengthening continued; hip extension variation (small range) attempted in side lying but discontinued due to increased lumbar discomfort. Increased lumbar and LE mobility noted post stretching and hip strengthening exercises; discussion with patient on stretching after prolonged sitting before transferring to navigating stairs. Increased anterior hip discomfort with lateral step on on low step.  Eval: Patient is a 67 y.o. male who was seen today for physical therapy evaluation and treatment for  Spondylolisthesis at L5-S1 level and   Chronic bilateral low back pain with right-sided sciatica  .   OBJECTIVE IMPAIRMENTS: Abnormal gait, decreased activity tolerance, decreased endurance, decreased mobility, decreased ROM, hypomobility, increased fascial restrictions, increased muscle spasms, impaired flexibility, improper body mechanics, postural dysfunction, and pain.   GOALS: Goals reviewed with patient? Yes  SHORT TERM GOALS: Target date: 02/10/2023  Independent in initial HEP  Baseline: Goal status: INITIAL  2.  Decrease LB and R LE pain by 25-50% allowing patient to return to normal activities  Baseline:  Goal status: INITIAL   LONG TERM GOALS: Target date: 03/24/2023  Increase hamstring flexibility to ~ 75 deg bilat  Baseline:  Goal status: INITIAL  2.  Trunk ROM/mobility WFL's and minimal to no pain  Baseline:  Goal status: INITIAL  3.  Increase strength R LE to 4+/5 to 5/5  Baseline:  Goal status: INITIAL  4.  Patient reports return to regular activities including regular exercise - yoga and  water exercises Baseline:  Goal status: INITIAL  5.  Independent in HEP including aquatic program as indicated  Baseline:  Goal status: INITIAL  6.  Improve functional limitation score to 48 Baseline:  Goal status: INITIAL  PLAN:  PT FREQUENCY: 2x/week  PT DURATION: 12 weeks  PLANNED INTERVENTIONS: Therapeutic exercises, Therapeutic activity, Neuromuscular re-education, Balance training, Gait training, Patient/Family education, Self Care, Joint mobilization, Aquatic Therapy, Dry Needling, Electrical stimulation, Spinal mobilization, Cryotherapy, Moist heat, Taping, Traction, Ultrasound, Ionotophoresis 4mg /ml Dexamethasone, Manual therapy, and Re-evaluation.  PLAN FOR NEXT SESSION: DN next visit. Progress  exercises; continue efforts to educate patient re back care and body mechanics; manual work, DN, modalities as indicated    Sanjuana Mae, PTA 01/18/2023, 3:34 PM

## 2023-01-19 ENCOUNTER — Other Ambulatory Visit (HOSPITAL_BASED_OUTPATIENT_CLINIC_OR_DEPARTMENT_OTHER): Payer: Self-pay

## 2023-01-21 ENCOUNTER — Encounter: Payer: Self-pay | Admitting: Rehabilitative and Restorative Service Providers"

## 2023-01-21 ENCOUNTER — Ambulatory Visit: Payer: Commercial Managed Care - PPO

## 2023-01-21 DIAGNOSIS — R29898 Other symptoms and signs involving the musculoskeletal system: Secondary | ICD-10-CM

## 2023-01-21 DIAGNOSIS — M4317 Spondylolisthesis, lumbosacral region: Secondary | ICD-10-CM

## 2023-01-21 DIAGNOSIS — G8929 Other chronic pain: Secondary | ICD-10-CM | POA: Diagnosis not present

## 2023-01-21 DIAGNOSIS — M6281 Muscle weakness (generalized): Secondary | ICD-10-CM | POA: Diagnosis not present

## 2023-01-21 DIAGNOSIS — M5441 Lumbago with sciatica, right side: Secondary | ICD-10-CM | POA: Diagnosis not present

## 2023-01-21 NOTE — Therapy (Addendum)
OUTPATIENT PHYSICAL THERAPY THORACOLUMBAR TREATMENT   Patient Name: Reginald Rios MRN: [id number] DOB:Dec 01, 1955, 67 y.o., male Today's Date: 01/21/2023  END OF SESSION:  PT End of Session - 01/21/23 1318     Visit Number 6    Number of Visits 24    Date for PT Re-Evaluation 03/24/23    Authorization Type [employer] $20 copay    PT Start Time 1315    PT Stop Time 1415    PT Time Calculation (min) 60 min    Activity Tolerance Patient tolerated treatment well    Behavior During Therapy [facility] for tasks assessed/performed             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   BPPV (benign paroxysmal positional vertigo) 08/14/2014   CAD (coronary artery disease)    2/19 PCI/DESx1 to Lcx   Cervical disc disorder with radiculopathy of cervical region 10/07/2015   Cervical radicular pain 01/10/2019   Chronic cough 04/13/2019   Decreased cardiac ejection fraction 04/02/2015   Diabetes mellitus without complication (HCC)    Dyspnea on exertion 05/15/2015   Erectile dysfunction 07/11/2019   Exercise-induced asthma    Family history of early CAD 07/05/2017   Fatigue 05/15/2015   Hallux rigidus, right foot 09/15/2021   Heart murmur    Hemarthrosis of right elbow 02/03/2016   Hoarseness 04/13/2019   Hyperlipidemia    family hx of high cholesterol   Hypogonadism in male    Incomplete rotator cuff tear 09/10/2015   Injected 09/10/2015   Lateral epicondylitis of right elbow 07/28/2019   Low back pain at multiple sites 02/05/2021   Low vitamin D level 04/22/2017   Mild  persistent asthma/cough variant asthma 04/13/2019   Myofascial pain syndrome, cervical 07/28/2019   Nonischemic cardiomyopathy (HCC) 05/15/2015   Nontraumatic incomplete tear of right rotator cuff 07/25/2019   Onychomycosis 05/25/2018   Osteoarthritis    Pain in right ankle and joints of right foot 10/05/2016   Palpitations 07/06/2017   Shoulder bursitis 10/12/2014   Injected in 10/12/2014  Repeat 01/21/2015   Small thenar eminence 02/03/2016   Spinal stenosis, lumbar region, without neurogenic claudication 10/12/2014   Spondylolisthesis at L5-S1 level 05/09/2021   Strain of latissimus dorsi muscle 01/21/2015   Tendinopathy of right biceps tendon 07/25/2019   Type 2 diabetes mellitus with hyperglycemia, without long-term current use of insulin (HCC) 12/22/2013   Ulnar neuropathy at elbow of right upper extremity 01/03/2019   Past Surgical History:  Procedure Laterality Date   CORONARY PRESSURE/FFR STUDY N/A 06/23/2017   Procedure: INTRAVASCULAR PRESSURE WIRE/FFR STUDY;  Surgeon: Marykay Lex, MD;  Location: [facility] INVASIVE CV LAB;  Service: Cardiovascular;  Laterality: N/A;   CORONARY STENT INTERVENTION N/A 06/23/2017   Procedure: CORONARY STENT INTERVENTION;  Surgeon: Marykay Lex, MD;  Location: [facility] INVASIVE CV LAB;  Service: Cardiovascular;  Laterality: N/A;   KNEE ARTHROSCOPY     LEFT HEART CATH AND CORONARY ANGIOGRAPHY N/A 06/23/2017   Procedure: LEFT HEART CATH AND CORONARY ANGIOGRAPHY;  Surgeon: Marykay Lex, MD;  Location: [facility] INVASIVE CV LAB;  Service: Cardiovascular;  Laterality: N/A;   SHOULDER ARTHROSCOPY WITH BICEPSTENOTOMY Right 09/06/2019   Procedure: SHOULDER ARTHROSCOPY WITH BICEPSTENOTOMY;  Surgeon: Tarry Kos, MD;  Location: [facility];  Service: Orthopedics;  Laterality: Right;   SHOULDER ARTHROSCOPY WITH SUBACROMIAL DECOMPRESSION Right 09/06/2019   Procedure: RIGHT SHOULDER ARTHROSCOPY WITH EXTENSIVE DEBRIDEMENT, SUBACROMIAL DECOMPRESSION, BICEPS  TENOTOMY;  Surgeon: Tarry Kos, MD;  Location: [facility];  Service: Orthopedics;  Laterality: Right;   TENOTOMY ACHILLES TENDON     Patient Active Problem List   Diagnosis Date Noted   Hallux rigidus, right foot 09/15/2021   Spondylolisthesis at L5-S1 level 05/09/2021   Low back pain at multiple sites 02/05/2021   Osteoarthritis    Heart murmur    Exercise-induced asthma    Diabetes mellitus without complication (HCC)    Arthritis    Anemia    Myofascial pain syndrome, cervical 07/28/2019   Lateral epicondylitis of right elbow 07/28/2019   Nontraumatic incomplete tear of right rotator cuff 07/25/2019   Tendinopathy of right biceps tendon 07/25/2019   Erectile dysfunction 07/11/2019   Mild persistent asthma/cough variant asthma 04/13/2019   Allergic rhinitis with a possible nonallergic component 04/13/2019   Acid reflux 04/13/2019   Chronic cough 04/13/2019   Hoarseness 04/13/2019   Cervical radicular pain 01/10/2019   Ulnar neuropathy at elbow of right upper extremity 01/03/2019   Acute bilateral low back pain without sciatica 01/03/2019   Onychomycosis 05/25/2018   Palpitations 07/06/2017   CAD (coronary artery disease) 07/05/2017   Family history of early CAD 07/05/2017   Abnormal findings on diagnostic imaging of cardiovascular system 06/23/2017   Low vitamin D level 04/22/2017   Pain in right ankle and joints of right foot 10/05/2016   Hemarthrosis of right elbow 02/03/2016   Small thenar eminence 02/03/2016   Cervical disc disorder with radiculopathy of cervical region 10/07/2015   Incomplete rotator cuff tear 09/10/2015   Nonischemic cardiomyopathy (HCC) 05/15/2015   Fatigue 05/15/2015   Dyspnea on exertion 05/15/2015   Decreased cardiac ejection fraction 04/02/2015   Strain of latissimus dorsi muscle 01/21/2015   Shoulder bursitis 10/12/2014   Spinal stenosis, lumbar region, without neurogenic claudication 10/12/2014   BPPV (benign paroxysmal  positional vertigo) 08/14/2014   Hyperlipidemia 01/19/2014   Anemia, unspecified 01/19/2014   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   Type 2 diabetes mellitus with hyperglycemia, without long-term current use of insulin (HCC) 12/22/2013   Hypogonadism in male 12/22/2013   Arthritis of hand, left 12/22/2013    PCP: Dr Arva Chafe, DO  REFERRING PROVIDER: Dr Rodolph Bong   REFERRING DIAG:  Spondylolisthesis at L5-S1 level  Chronic bilateral low back pain with right-sided sciatica    Rationale for Evaluation and Treatment: Rehabilitation  THERAPY DIAG:  Spondylolisthesis at L5-S1 level  Chronic bilateral low back pain with right-sided sciatica  Other symptoms and signs involving the musculoskeletal system  Muscle weakness (generalized)  ONSET DATE: 12/12/22  SUBJECTIVE:  SUBJECTIVE STATEMENT:  Patient states his R leg feels weak when going up stairs. Patient states his neck is very sore and tight after doing leg stretches from HEP. Patient states he continues to have pain on R glute that starts at center and radiates out to lateral side. Good response to dry needling and exercises.    PERTINENT HISTORY:  Chronic LBP; bilat hamstring tightness; bilat knees; cervical disc surgery; AODM   PAIN:  Are you having pain? Yes: NPRS scale: 5/10 R LB; 8-9/10 R LB at night  Pain location: LB; R leg Pain description: dull aching in the LB;; sharp in the leg  Aggravating factors: back - sitting > 10 min; leg - turning foot/twisting  Relieving factors: meds   PRECAUTIONS: None    WEIGHT BEARING RESTRICTIONS: No  FALLS:  Has patient fallen in last 6 months? No  LIVING ENVIRONMENT: Lives with: lives with their spouse Lives in: House/apartment Stairs: Yes: Internal:  24 steps; on right going up and External: 3 steps; on right going up Has following equipment at home: None  OCCUPATION: retired Conservation officer, [employer]; active playing pickle ball    PATIENT GOALS: stop most of the pain; get back to activities   NEXT MD VISIT: 02/09/23  OBJECTIVE:   DIAGNOSTIC FINDINGS:  Xray 12/29/22 - results unavailable   PATIENT SURVEYS:  FOTO 31; goals 48    SENSATION: WFL  MUSCLE LENGTH: Hamstrings: Right 70 deg; Left 65 deg Tight hip flexors bilat   POSTURE: rounded shoulders, forward head, decreased lumbar lordosis, increased thoracic kyphosis, flexed trunk , and weight shift right  PALPATION: Pain with PA mobs lumbar spine; R lumbar paraspinals; posterior hip in gluts and piriformis; hamstrings   LUMBAR ROM:   AROM eval  Flexion 70% pain bilat LB  Extension 10% pain R mid back   Right lateral flexion 65% pull L > R LB  Left lateral flexion 60% pull R LB  Right rotation 20% pain mid back  Left rotation 20% discomfort mid back    (Blank rows = not tested)  LOWER EXTREMITY ROM:     Active Assistive Right eval Left eval  Hip flexion    Hip extension 0 0  Hip abduction    Hip adduction    Hip internal rotation    Hip external rotation    Knee flexion 90 95  Knee extension -5 -5  Ankle dorsiflexion 0 0  Ankle plantarflexion    Ankle inversion    Ankle eversion     (Blank rows = not tested)  LOWER EXTREMITY MMT:    MMT Right eval Left eval  Hip flexion 5- 5  Hip extension 4- 5-  Hip abduction 3+ 4+  Hip adduction    Hip internal rotation    Hip external rotation    Knee flexion 5 5  Knee extension 5 5  Ankle dorsiflexion    Ankle plantarflexion    Ankle inversion    Ankle eversion     (Blank rows = not tested)  LUMBAR SPECIAL TESTS:  Straight leg raise test: Negative and Slump test: Negative  FUNCTIONAL TESTS:  SLS: R 2 sec ; L 1 sec  5 times sit to stand: 23.5 sec  GAIT: Distance walked: 40  Assistive device utilized:  None Level of assistance: Complete Independence Comments: flexed forward at trunk and hips    [facility] Adult PT Treatment:  DATE: 01/21/2023 Therapeutic Exercise: NuStep L7 x10 min Supine HS stretch with strap --> cues to decrease postural tightness and improve alignment S/L reverse clamshells 2x10 (R) S/L straight leg hip abd in IR 2x10 (R) Manual Therapy: IASTM R glute med Passive R knee flexion/rotation (prone) + pin and stretch glute med/piriformis Skilled palpation to assess response to manual work and DN Trigger Point Dry-Needling  Treatment instructions: Expect mild to moderate muscle soreness. S/S of pneumothorax if dry needled over a lung field, and to seek immediate medical attention should they occur. Patient verbalized understanding of these instructions and education.  Patient Consent Given: Yes Education handout provided: Previously provided Muscles treated: R hamstring; inferior iliac crest, QL, lateral gastroc   Electrical stimulation performed: Yes Parameters: mAmp current intensity to patient tolerance  Treatment response/outcome: decreased palpable tightness    [facility] Adult PT Treatment:                                                DATE: 01/18/2023 Therapeutic Exercise: NuStep L6 x 6 min Supine: Sciatic nerve glide x10  Peroneal nerve glide x10 Bridges + ball squeeze 2x10 Modified figure 4 stretch --> pulling knee to opp shoulder 3x30" (B) S/L clamshell RTB to fatigue (30 reps) S/L hip ext in abd x10 Seated hip IR AROM with slider 2x10 (R), x10 (L) Standing forward bend with legs crossed (ITB) Step ups 6" step front Lateral step up 4"     [facility] Adult PT Treatment:                                                DATE: 01/13/23 Therapeutic Exercise: Nustep L5 x 6 Sitting  Hamstring stretch 30 sec x 3 Figure 4 stretch 20 sec x 1  Standing  Gastroc stretch bilat on pro stretch 30 sec x 2 Soleus stretch bilat on pro  stretch 30 sec x 2  Supine  Hamstring strength w/ strap 30 sec x 3  ITB stretch with strap 30 sec x 1  Piriformis travell with strap 30 sec x 3  Transverse abdominals pelvic floor and transverse abdominals 10 sec x 5  Manual Therapy: Soft tissue mobilization R posterior thigh Skilled palpation to assess response to manual work and DN Trigger Point Dry-Needling  Treatment instructions: Expect mild to moderate muscle soreness. S/S of pneumothorax if dry needled over a lung field, and to seek immediate medical attention should they occur. Patient verbalized understanding of these instructions and education.  Patient Consent Given: Yes Education handout provided: Previously provided Muscles treated: R hamstring; inferior iliac crest, QL, lateral gastroc   Electrical stimulation performed: Yes Parameters: mAmp current intensity to patient tolerance  Treatment response/outcome: decreased palpable tightness  Self care:  Myofacial ball release work R posterior hip/LB in standing back to wall  Discussed method for getting in and out of his car - sitting into the car seat then walking his feet and legs in keeping body straight    PATIENT EDUCATION:  Education details: POC; HEP Person educated: Patient Education method: Programmer, multimedia, Facilities manager, Actor cues, Verbal cues, and Handouts Education comprehension: verbalized understanding, returned demonstration, verbal cues required, tactile cues required, and needs further education  HOME EXERCISE PROGRAM: Access Code: [email or web address] URL:  [email or web address] Date: 01/15/2023 Prepared by: Carlynn Herald  Exercises - Hooklying Hamstring Stretch with Strap  - 2 x daily - 7 x weekly - 1 sets - 3 reps - 30 sec  hold - Supine ITB Stretch with Strap  - 2 x daily - 7 x weekly - 1 sets - 3 reps - 30 sec  hold - Supine Piriformis Stretch with Leg Straight  - 2 x daily - 7 x weekly - 1 sets - 3 reps - 30 sec  hold - Supine Transversus  Abdominis Bracing with Pelvic Floor Contraction  - 2 x daily - 7 x weekly - 1 sets - 10 reps - 10sec  hold - Supine Peroneal Nerve Glide  - 1 x daily - 7 x weekly - 3 sets - 10 reps - Supine Sciatic Nerve Glide  - 1 x daily - 7 x weekly - 3 sets - 10 reps - Supine Bridge with Mini Swiss Ball Between Knees  - 1 x daily - 7 x weekly - 3 sets - 10 reps - Seated Self Great Toe Stretch  - 1 x daily - 7 x weekly - 3 sets - 10 reps - Seated Hip Internal Rotation AROM  - 1 x daily - 7 x weekly - 3 sets - 10 reps  ASSESSMENT:  CLINICAL IMPRESSION: Patient continues to have initial tightness in R glute med that decreases with LE stretches and hip mobility exercises. Hip internal rotation mobility and strengthening exercises continued; noted difficulty maintaining hip IR with side lying leg raises.  Dry needling provided by Corlis Leak, PT Good response to DN and manual work with decreased palpable tightness noted following treatment.   Eval: Patient is a 67 y.o. male who was seen today for physical therapy evaluation and treatment for  Spondylolisthesis at L5-S1 level and   Chronic bilateral low back pain with right-sided sciatica  .   OBJECTIVE IMPAIRMENTS: Abnormal gait, decreased activity tolerance, decreased endurance, decreased mobility, decreased ROM, hypomobility, increased fascial restrictions, increased muscle spasms, impaired flexibility, improper body mechanics, postural dysfunction, and pain.   GOALS: Goals reviewed with patient? Yes  SHORT TERM GOALS: Target date: 02/10/2023  Independent in initial HEP  Baseline: Goal status: INITIAL  2.  Decrease LB and R LE pain by 25-50% allowing patient to return to normal activities  Baseline:  Goal status: INITIAL   LONG TERM GOALS: Target date: 03/24/2023  Increase hamstring flexibility to ~ 75 deg bilat  Baseline:  Goal status: INITIAL  2.  Trunk ROM/mobility WFL's and minimal to no pain  Baseline:  Goal status: INITIAL  3.   Increase strength R LE to 4+/5 to 5/5  Baseline:  Goal status: INITIAL  4.  Patient reports return to regular activities including regular exercise - yoga and water exercises Baseline:  Goal status: INITIAL  5.  Independent in HEP including aquatic program as indicated  Baseline:  Goal status: INITIAL  6.  Improve functional limitation score to 48 Baseline:  Goal status: INITIAL  PLAN:  PT FREQUENCY: 2x/week  PT DURATION: 12 weeks  PLANNED INTERVENTIONS: Therapeutic exercises, Therapeutic activity, Neuromuscular re-education, Balance training, Gait training, Patient/Family education, Self Care, Joint mobilization, Aquatic Therapy, Dry Needling, Electrical stimulation, Spinal mobilization, Cryotherapy, Moist heat, Taping, Traction, Ultrasound, Ionotophoresis 4mg /ml Dexamethasone, Manual therapy, and Re-evaluation.  PLAN FOR NEXT SESSION: Progress exercises; continue efforts to educate patient re back care and body mechanics; manual work, DN, modalities as indicated    Sanjuana Mae, PTA 01/21/2023, 3:08  PM   Samari Gorby P. Leonor Liv PT, MPH 01/21/23 3:41 PM

## 2023-01-24 ENCOUNTER — Ambulatory Visit
Admission: RE | Admit: 2023-01-24 | Discharge: 2023-01-24 | Disposition: A | Discharge status: Home / Self Care | Payer: Commercial Managed Care - PPO | Source: Ambulatory Visit | Attending: Family Medicine | Admitting: Family Medicine

## 2023-01-24 DIAGNOSIS — M4316 Spondylolisthesis, lumbar region: Secondary | ICD-10-CM | POA: Diagnosis not present

## 2023-01-24 DIAGNOSIS — S32020A Wedge compression fracture of second lumbar vertebra, initial encounter for closed fracture: Secondary | ICD-10-CM

## 2023-01-24 DIAGNOSIS — M47816 Spondylosis without myelopathy or radiculopathy, lumbar region: Secondary | ICD-10-CM | POA: Diagnosis not present

## 2023-01-24 DIAGNOSIS — M5126 Other intervertebral disc displacement, lumbar region: Secondary | ICD-10-CM | POA: Diagnosis not present

## 2023-01-26 ENCOUNTER — Encounter: Payer: Self-pay | Admitting: Family Medicine

## 2023-01-26 ENCOUNTER — Other Ambulatory Visit (HOSPITAL_BASED_OUTPATIENT_CLINIC_OR_DEPARTMENT_OTHER): Payer: Self-pay

## 2023-01-26 ENCOUNTER — Ambulatory Visit: Payer: Commercial Managed Care - PPO | Admitting: Family Medicine

## 2023-01-26 VITALS — BP 128/76 | HR 76 | Temp 98.4°F | Ht 70.0 in | Wt 199.4 lb

## 2023-01-26 DIAGNOSIS — Z7984 Long term (current) use of oral hypoglycemic drugs: Secondary | ICD-10-CM

## 2023-01-26 DIAGNOSIS — E1165 Type 2 diabetes mellitus with hyperglycemia: Secondary | ICD-10-CM

## 2023-01-26 DIAGNOSIS — Z23 Encounter for immunization: Secondary | ICD-10-CM | POA: Diagnosis not present

## 2023-01-26 DIAGNOSIS — B353 Tinea pedis: Secondary | ICD-10-CM | POA: Diagnosis not present

## 2023-01-26 LAB — HEMOGLOBIN A1C: Hgb A1c MFr Bld: 8 % — ABNORMAL HIGH (ref 4.6–6.5)

## 2023-01-26 MED ORDER — KETOCONAZOLE 2 % EX CREA
1.0000 | TOPICAL_CREAM | Freq: Every day | CUTANEOUS | 0 refills | Status: AC
Start: 2023-01-26 — End: 2023-03-09
  Filled 2023-01-26: qty 30, 30d supply, fill #0

## 2023-01-26 MED ORDER — MECLIZINE HCL 25 MG PO TABS
25.0000 mg | ORAL_TABLET | Freq: Three times a day (TID) | ORAL | 0 refills | Status: AC | PRN
  Filled 2023-01-26: qty 30, 10d supply, fill #0

## 2023-01-26 MED ORDER — GLYBURIDE 5 MG PO TABS
5.0000 mg | ORAL_TABLET | Freq: Every day | ORAL | 1 refills | Status: DC
  Filled 2023-01-26: qty 30, 30d supply, fill #0
  Filled 2023-02-27: qty 30, 30d supply, fill #1

## 2023-01-26 NOTE — Patient Instructions (Addendum)
Give [place] 2-3 business days to get the results of your labs back.  Keep the diet clean and stay active.  We are starting a new medication to help lower your sugars.  Let us know if you need anything.  Hand Exercises Hand exercises can be helpful for almost anyone. These exercises can strengthen the hands, improve flexibility and movement, and increase blood flow to the hands. These results can make work and daily tasks easier. Hand exercises can be especially helpful for people who have joint pain from arthritis or have nerve damage from overuse (carpal tunnel syndrome). These exercises can also help people who have injured a hand. Exercises Most of these hand exercises are gentle stretching and motion exercises. It is usually safe to do them often throughout the day. Warming up your hands before exercise may help to reduce stiffness. You can do this with gentle massage or by placing your hands in warm water for 10-15 minutes. It is normal to feel some stretching, pulling, tightness, or mild discomfort as you begin new exercises. This will gradually improve. Stop an exercise right away if you feel sudden, severe pain or your pain gets worse. Ask your health care provider which exercises are best for you. Knuckle bend or "claw" fist Stand or sit with your arm, hand, and all five fingers pointed straight up. Make sure to keep your wrist straight during the exercise. Gently bend your fingers down toward your palm until the tips of your fingers are touching the top of your palm. Keep your big knuckle straight and just bend the small knuckles in your fingers. Hold this position for 3 seconds. Straighten (extend) your fingers back to the starting position. Repeat this exercise 5-10 times with each hand. Full finger fist Stand or sit with your arm, hand, and all five fingers pointed straight up. Make sure to keep your wrist straight during the exercise. Gently bend your fingers into your palm until the  tips of your fingers are touching the middle of your palm. Hold this position for 3 seconds. Extend your fingers back to the starting position, stretching every joint fully. Repeat this exercise 5-10 times with each hand. Straight fist Stand or sit with your arm, hand, and all five fingers pointed straight up. Make sure to keep your wrist straight during the exercise. Gently bend your fingers at the big knuckle, where your fingers meet your hand, and the middle knuckle. Keep the knuckle at the tips of your fingers straight and try to touch the bottom of your palm. Hold this position for 3 seconds. Extend your fingers back to the starting position, stretching every joint fully. Repeat this exercise 5-10 times with each hand. Tabletop Stand or sit with your arm, hand, and all five fingers pointed straight up. Make sure to keep your wrist straight during the exercise. Gently bend your fingers at the big knuckle, where your fingers meet your hand, as far down as you can while keeping the small knuckles in your fingers straight. Think of forming a tabletop with your fingers. Hold this position for 3 seconds. Extend your fingers back to the starting position, stretching every joint fully. Repeat this exercise 5-10 times with each hand. Finger spread Place your hand flat on a table with your palm facing down. Make sure your wrist stays straight as you do this exercise. Spread your fingers and thumb apart from each other as far as you can until you feel a gentle stretch. Hold this position for 3  seconds. Bring your fingers and thumb tight together again. Hold this position for 3 seconds. Repeat this exercise 5-10 times with each hand. Making circles Stand or sit with your arm, hand, and all five fingers pointed straight up. Make sure to keep your wrist straight during the exercise. Make a circle by touching the tip of your thumb to the tip of your index finger. Hold for 3 seconds. Then open your hand  wide. Repeat this motion with your thumb and each finger on your hand. Repeat this exercise 5-10 times with each hand. Thumb motion Sit with your forearm resting on a table and your wrist straight. Your thumb should be facing up toward the ceiling. Keep your fingers relaxed as you move your thumb. Lift your thumb up as high as you can toward the ceiling. Hold for 3 seconds. Bend your thumb across your palm as far as you can, reaching the tip of your thumb for the small finger (pinkie) side of your palm. Hold for 3 seconds. Repeat this exercise 5-10 times with each hand. Grip strengthening  Hold a stress ball or other soft ball in the middle of your hand. Slowly increase the pressure, squeezing the ball as much as you can without causing pain. Think of bringing the tips of your fingers into the middle of your palm. All of your finger joints should bend when doing this exercise. Hold your squeeze for 3 seconds, then relax. Repeat this exercise 5-10 times with each hand. Contact a health care provider if: Your hand pain or discomfort gets much worse when you do an exercise. Your hand pain or discomfort does not improve within 2 hours after you exercise. If you have any of these problems, stop doing these exercises right away. Do not do them again unless your health care provider says that you can. Get help right away if: You develop sudden, severe hand pain or swelling. If this happens, stop doing these exercises right away. Do not do them again unless your health care provider says that you can. Make sure you discuss any questions you have with your health care provider. Document Revised: 08/11/2018 Document Reviewed: 04/21/2018 Elsevier Patient Education  2020 [employer].

## 2023-01-26 NOTE — Progress Notes (Signed)
Subjective:   Chief Complaint  Patient presents with   Follow-up   Dizziness    Needs a refill on meclizine    Reginald Rios is a 67 y.o. male here for follow-up of diabetes.   Reginald Rios's self monitored glucose range is mid 100's.  Patient denies hypoglycemic reactions. Patient does not require insulin.   Medications include: metformin 1000 mg bid, glyburide 5 mg/d Diet is OK.  Exercise: walking  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   BPPV (benign paroxysmal positional vertigo) 08/14/2014   CAD (coronary artery disease)    2/19 PCI/DESx1 to Lcx   Cervical disc disorder with radiculopathy of cervical region 10/07/2015   Cervical radicular pain 01/10/2019   Chronic cough 04/13/2019   Decreased cardiac ejection fraction 04/02/2015   Diabetes mellitus without complication (HCC)    Dyspnea on exertion 05/15/2015   Erectile dysfunction 07/11/2019   Exercise-induced asthma    Family history of early CAD 07/05/2017   Fatigue 05/15/2015   Hallux rigidus, right foot 09/15/2021   Heart murmur    Hemarthrosis of right elbow 02/03/2016   Hoarseness 04/13/2019   Hyperlipidemia    family hx of high cholesterol   Hypogonadism in male    Incomplete rotator cuff tear 09/10/2015   Injected 09/10/2015   Lateral epicondylitis of right elbow 07/28/2019   Low back pain at multiple sites 02/05/2021   Low vitamin D level 04/22/2017   Mild persistent asthma/cough variant asthma 04/13/2019   Myofascial pain syndrome, cervical 07/28/2019   Nonischemic cardiomyopathy (HCC) 05/15/2015   Nontraumatic  incomplete tear of right rotator cuff 07/25/2019   Onychomycosis 05/25/2018   Osteoarthritis    Pain in right ankle and joints of right foot 10/05/2016   Palpitations 07/06/2017   Shoulder bursitis 10/12/2014   Injected in 10/12/2014  Repeat 01/21/2015   Small thenar eminence 02/03/2016   Spinal stenosis, lumbar region, without neurogenic claudication 10/12/2014   Spondylolisthesis at L5-S1 level 05/09/2021   Strain of latissimus dorsi muscle 01/21/2015   Tendinopathy of right biceps tendon 07/25/2019   Type 2 diabetes mellitus with hyperglycemia, without long-term current use of insulin (HCC) 12/22/2013   Ulnar neuropathy at elbow of right upper extremity 01/03/2019     Related testing: Retinal exam: Done Pneumovax: done  Objective:  BP 128/76 (BP Location: Left Arm, Patient Position: Sitting, Cuff Size: Normal)   Pulse 76   Temp 98.4 F (36.9 C) (Oral)   Ht 5\' 10"  (1.778 m)   Wt 199 lb 6 oz (90.4 kg)   SpO2 98%   BMI 28.61 kg/m  General:  Well developed, well nourished, in no apparent distress Skin: Macerated tissue on the left foot between fourth/fifth digit.  Otherwise warm, no pallor or diaphoresis Head:  Normocephalic, atraumatic Eyes:  Pupils equal and round, sclera anicteric without injection  Lungs:  CTAB, no access msc use Cardio:  RRR, no bruits, no LE edema, DP pulses 3+ bilaterally Neuro:  Sensation intact to pinprick on feet Psych: Age appropriate judgment and insight  Assessment:   Type 2 diabetes mellitus with hyperglycemia, without long-term current use of insulin (HCC) -  Plan: glyBURIDE (DIABETA) 5 MG tablet, Hemoglobin A1c  Tinea pedis of left foot - Plan: ketoconazole (NIZORAL) 2 % cream   Plan:   Chronic, uncontrolled. Cont metformin 1000 mg bid, Actos 45 mg/d, add glyburide 5 mg/d. Goal A1c is <7.5. Counseled on diet and exercise. 6 weeks of ketoconazole cream once daily. F/u in 3 mo. The patient voiced understanding and agreement to the  plan.  Jilda Roche Moore, DO 01/26/23 10:29 AM

## 2023-01-26 NOTE — Addendum Note (Signed)
Addended by: Maximino Sarin on: 01/26/2023 10:42 AM   Modules accepted: Orders

## 2023-01-27 ENCOUNTER — Ambulatory Visit: Payer: Commercial Managed Care - PPO

## 2023-01-27 DIAGNOSIS — M4317 Spondylolisthesis, lumbosacral region: Secondary | ICD-10-CM

## 2023-01-27 DIAGNOSIS — R29898 Other symptoms and signs involving the musculoskeletal system: Secondary | ICD-10-CM | POA: Diagnosis not present

## 2023-01-27 DIAGNOSIS — M5441 Lumbago with sciatica, right side: Secondary | ICD-10-CM | POA: Diagnosis not present

## 2023-01-27 DIAGNOSIS — M6281 Muscle weakness (generalized): Secondary | ICD-10-CM | POA: Diagnosis not present

## 2023-01-27 DIAGNOSIS — G8929 Other chronic pain: Secondary | ICD-10-CM

## 2023-01-27 NOTE — Therapy (Signed)
OUTPATIENT PHYSICAL THERAPY THORACOLUMBAR TREATMENT   Patient Name: Reginald Rios MRN: [id number] DOB:1955-05-25, 67 y.o., male Today's Date: 01/27/2023  END OF SESSION:  PT End of Session - 01/27/23 1457     Visit Number 7    Number of Visits 24    Date for PT Re-Evaluation 03/24/23    Authorization Type [employer] $20 copay    PT Start Time 1453    PT Stop Time 1545    PT Time Calculation (min) 52 min    Activity Tolerance Patient tolerated treatment well    Behavior During Therapy [facility] for tasks assessed/performed            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   BPPV (benign paroxysmal positional vertigo) 08/14/2014   CAD (coronary artery disease)    2/19 PCI/DESx1 to Lcx   Cervical disc disorder with radiculopathy of cervical region 10/07/2015   Cervical radicular pain 01/10/2019   Chronic cough 04/13/2019   Decreased cardiac ejection fraction 04/02/2015   Diabetes mellitus without complication (HCC)    Dyspnea on exertion 05/15/2015   Erectile dysfunction 07/11/2019   Exercise-induced asthma    Family history of early CAD 07/05/2017   Fatigue 05/15/2015   Hallux rigidus, right foot 09/15/2021   Heart murmur    Hemarthrosis of right elbow 02/03/2016   Hoarseness 04/13/2019   Hyperlipidemia    family hx of high cholesterol   Hypogonadism in male    Incomplete rotator cuff tear 09/10/2015   Injected 09/10/2015   Lateral epicondylitis of right elbow 07/28/2019   Low back pain at multiple sites 02/05/2021   Low vitamin D level 04/22/2017   Mild  persistent asthma/cough variant asthma 04/13/2019   Myofascial pain syndrome, cervical 07/28/2019   Nonischemic cardiomyopathy (HCC) 05/15/2015   Nontraumatic incomplete tear of right rotator cuff 07/25/2019   Onychomycosis 05/25/2018   Osteoarthritis    Pain in right ankle and joints of right foot 10/05/2016   Palpitations 07/06/2017   Shoulder bursitis 10/12/2014   Injected in 10/12/2014  Repeat 01/21/2015   Small thenar eminence 02/03/2016   Spinal stenosis, lumbar region, without neurogenic claudication 10/12/2014   Spondylolisthesis at L5-S1 level 05/09/2021   Strain of latissimus dorsi muscle 01/21/2015   Tendinopathy of right biceps tendon 07/25/2019   Type 2 diabetes mellitus with hyperglycemia, without long-term current use of insulin (HCC) 12/22/2013   Ulnar neuropathy at elbow of right upper extremity 01/03/2019   Past Surgical History:  Procedure Laterality Date   CORONARY PRESSURE/FFR STUDY N/A 06/23/2017   Procedure: INTRAVASCULAR PRESSURE WIRE/FFR STUDY;  Surgeon: Marykay Lex, MD;  Location: [facility] INVASIVE CV LAB;  Service: Cardiovascular;  Laterality: N/A;   CORONARY STENT INTERVENTION N/A 06/23/2017   Procedure: CORONARY STENT INTERVENTION;  Surgeon: Marykay Lex, MD;  Location: [facility] INVASIVE CV LAB;  Service: Cardiovascular;  Laterality: N/A;   KNEE ARTHROSCOPY     LEFT HEART CATH AND CORONARY ANGIOGRAPHY N/A 06/23/2017   Procedure: LEFT HEART CATH AND CORONARY ANGIOGRAPHY;  Surgeon: Marykay Lex, MD;  Location: [facility] INVASIVE CV LAB;  Service: Cardiovascular;  Laterality:  N/A;   SHOULDER ARTHROSCOPY WITH BICEPSTENOTOMY Right 09/06/2019   Procedure: SHOULDER ARTHROSCOPY WITH BICEPSTENOTOMY;  Surgeon: Tarry Kos, MD;  Location: [facility];  Service: Orthopedics;  Laterality: Right;   SHOULDER ARTHROSCOPY WITH SUBACROMIAL DECOMPRESSION Right 09/06/2019   Procedure: RIGHT SHOULDER ARTHROSCOPY WITH EXTENSIVE DEBRIDEMENT, SUBACROMIAL DECOMPRESSION, BICEPS  TENOTOMY;  Surgeon: Tarry Kos, MD;  Location: [facility];  Service: Orthopedics;  Laterality: Right;   TENOTOMY ACHILLES TENDON     Patient Active Problem List   Diagnosis Date Noted   Hallux rigidus, right foot 09/15/2021   Spondylolisthesis at L5-S1 level 05/09/2021   Low back pain at multiple sites 02/05/2021   Osteoarthritis    Heart murmur    Exercise-induced asthma    Diabetes mellitus without complication (HCC)    Arthritis    Anemia    Myofascial pain syndrome, cervical 07/28/2019   Lateral epicondylitis of right elbow 07/28/2019   Nontraumatic incomplete tear of right rotator cuff 07/25/2019   Tendinopathy of right biceps tendon 07/25/2019   Erectile dysfunction 07/11/2019   Mild persistent asthma/cough variant asthma 04/13/2019   Allergic rhinitis with a possible nonallergic component 04/13/2019   Acid reflux 04/13/2019   Chronic cough 04/13/2019   Hoarseness 04/13/2019   Cervical radicular pain 01/10/2019   Ulnar neuropathy at elbow of right upper extremity 01/03/2019   Acute bilateral low back pain without sciatica 01/03/2019   Onychomycosis 05/25/2018   Palpitations 07/06/2017   CAD (coronary artery disease) 07/05/2017   Family history of early CAD 07/05/2017   Abnormal findings on diagnostic imaging of cardiovascular system 06/23/2017   Low vitamin D level 04/22/2017   Pain in right ankle and joints of right foot 10/05/2016   Hemarthrosis of right elbow 02/03/2016   Small thenar eminence 02/03/2016   Cervical disc disorder with radiculopathy of cervical region 10/07/2015   Incomplete rotator cuff tear 09/10/2015   Nonischemic cardiomyopathy (HCC) 05/15/2015   Fatigue 05/15/2015   Dyspnea on exertion 05/15/2015   Decreased cardiac ejection fraction 04/02/2015   Strain of latissimus dorsi muscle 01/21/2015   Shoulder bursitis 10/12/2014   Spinal stenosis, lumbar region, without neurogenic claudication 10/12/2014   BPPV (benign paroxysmal  positional vertigo) 08/14/2014   Hyperlipidemia 01/19/2014   Anemia, unspecified 01/19/2014   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   Type 2 diabetes mellitus with hyperglycemia, without long-term current use of insulin (HCC) 12/22/2013   Hypogonadism in male 12/22/2013   Arthritis of hand, left 12/22/2013    PCP: Dr Arva Chafe, DO  REFERRING PROVIDER: Dr Rodolph Bong   REFERRING DIAG:  Spondylolisthesis at L5-S1 level  Chronic bilateral low back pain with right-sided sciatica    Rationale for Evaluation and Treatment: Rehabilitation  THERAPY DIAG:  Spondylolisthesis at L5-S1 level  Chronic bilateral low back pain with right-sided sciatica  Other symptoms and signs involving the musculoskeletal system  Muscle weakness (generalized)  ONSET DATE: 12/12/22  SUBJECTIVE:  SUBJECTIVE STATEMENT:  Patient reports a "sharp" pain across center of low back and pain along lateral R lower leg. Patient states he has been getting vertigo symptoms since he had MRI.   PERTINENT HISTORY:  Chronic LBP; bilat hamstring tightness; bilat knees; cervical disc surgery; AODM   PAIN:  Are you having pain? Yes: NPRS scale: 5/10 R LB; 8-9/10 R LB at night  Pain location: LB; R leg Pain description: dull aching in the LB;; sharp in the leg  Aggravating factors: back - sitting > 10 min; leg - turning foot/twisting  Relieving factors: meds   PRECAUTIONS: None    WEIGHT BEARING RESTRICTIONS: No  FALLS:  Has patient fallen in last 6 months? No  LIVING ENVIRONMENT: Lives with: lives with their spouse Lives in: House/apartment Stairs: Yes: Internal: 24 steps; on right going up and External: 3 steps; on right going up Has following equipment at home: None  OCCUPATION: retired  Conservation officer, [employer]; active playing pickle ball    PATIENT GOALS: stop most of the pain; get back to activities   NEXT MD VISIT: 02/09/23  OBJECTIVE:   DIAGNOSTIC FINDINGS:  Xray 12/29/22 - results unavailable   PATIENT SURVEYS:  FOTO 31; goals 48    SENSATION: WFL  MUSCLE LENGTH: Hamstrings: Right 70 deg; Left 65 deg Tight hip flexors bilat   POSTURE: rounded shoulders, forward head, decreased lumbar lordosis, increased thoracic kyphosis, flexed trunk , and weight shift right  PALPATION: Pain with PA mobs lumbar spine; R lumbar paraspinals; posterior hip in gluts and piriformis; hamstrings   LUMBAR ROM:   AROM eval  Flexion 70% pain bilat LB  Extension 10% pain R mid back   Right lateral flexion 65% pull L > R LB  Left lateral flexion 60% pull R LB  Right rotation 20% pain mid back  Left rotation 20% discomfort mid back    (Blank rows = not tested)  LOWER EXTREMITY ROM:     Active Assistive Right eval Left eval  Hip flexion    Hip extension 0 0  Hip abduction    Hip adduction    Hip internal rotation    Hip external rotation    Knee flexion 90 95  Knee extension -5 -5  Ankle dorsiflexion 0 0  Ankle plantarflexion    Ankle inversion    Ankle eversion     (Blank rows = not tested)  LOWER EXTREMITY MMT:    MMT Right eval Left eval  Hip flexion 5- 5  Hip extension 4- 5-  Hip abduction 3+ 4+  Hip adduction    Hip internal rotation    Hip external rotation    Knee flexion 5 5  Knee extension 5 5  Ankle dorsiflexion    Ankle plantarflexion    Ankle inversion    Ankle eversion     (Blank rows = not tested)  LUMBAR SPECIAL TESTS:  Straight leg raise test: Negative and Slump test: Negative  FUNCTIONAL TESTS:  SLS: R 2 sec ; L 1 sec  5 times sit to stand: 23.5 sec  GAIT: Distance walked: 40  Assistive device utilized: None Level of assistance: Complete Independence Comments: flexed forward at trunk and hips    [facility] Adult PT Treatment:  DATE: 01/27/2023 Therapeutic Exercise: Standing spinal extension stretch Glute med MWM side lying (propped on elbow) with red 4" ball: leg lifts, knee bends, clamshell (bothered lower leg) SKTC x6 Hooklying HS stretch --> sciatic nerve glide --> peroneal nerve glide (R) Small range supine bridges 2x10 Supported butterfly stretch (add) x Self Care: Self-Epley handout    [facility] Adult PT Treatment:                                                DATE: 01/21/2023 Therapeutic Exercise: NuStep L7 x10 min Supine HS stretch with strap --> cues to decrease postural tightness and improve alignment S/L reverse clamshells 2x10 (R) S/L straight leg hip abd in IR 2x10 (R) Manual Therapy: IASTM R glute med Passive R knee flexion/rotation (prone) + pin and stretch glute med/piriformis Skilled palpation to assess response to manual work and DN Trigger Point Dry-Needling  Treatment instructions: Expect mild to moderate muscle soreness. S/S of pneumothorax if dry needled over a lung field, and to seek immediate medical attention should they occur. Patient verbalized understanding of these instructions and education.  Patient Consent Given: Yes Education handout provided: Previously provided Muscles treated: R hamstring; inferior iliac crest, QL, lateral gastroc   Electrical stimulation performed: Yes Parameters: mAmp current intensity to patient tolerance  Treatment response/outcome: decreased palpable tightness    [facility] Adult PT Treatment:                                                DATE: 01/18/2023 Therapeutic Exercise: NuStep L6 x 6 min Supine: Sciatic nerve glide x10  Peroneal nerve glide x10 Bridges + ball squeeze 2x10 Modified figure 4 stretch --> pulling knee to opp shoulder 3x30" (B) S/L clamshell RTB to fatigue (30 reps) S/L hip ext in abd x10 Seated hip IR AROM with slider 2x10 (R), x10 (L) Standing forward bend with legs crossed (ITB) Step ups  6" step front Lateral step up 4"    PATIENT EDUCATION:  Education details: POC; HEP Person educated: Patient Education method: Explanation, Demonstration, Tactile cues, Verbal cues, and Handouts Education comprehension: verbalized understanding, returned demonstration, verbal cues required, tactile cues required, and needs further education  HOME EXERCISE PROGRAM: Access Code: [email or web address] URL: [email or web address] Date: 01/27/2023 Prepared by: Carlynn Herald  Exercises - Hooklying Hamstring Stretch with Strap  - 2 x daily - 7 x weekly - 1 sets - 3 reps - 30 sec  hold - Supine ITB Stretch with Strap  - 2 x daily - 7 x weekly - 1 sets - 3 reps - 30 sec  hold - Supine Piriformis Stretch with Leg Straight  - 2 x daily - 7 x weekly - 1 sets - 3 reps - 30 sec  hold - Supine Transversus Abdominis Bracing with Pelvic Floor Contraction  - 2 x daily - 7 x weekly - 1 sets - 10 reps - 10sec  hold - Supine Peroneal Nerve Glide  - 1 x daily - 7 x weekly - 3 sets - 10 reps - Supine Sciatic Nerve Glide  - 1 x daily - 7 x weekly - 3 sets - 10 reps - Supine Bridge with Mini Swiss Ball Between Knees  - 1 x daily -  7 x weekly - 3 sets - 10 reps - Seated Self Great Toe Stretch  - 1 x daily - 7 x weekly - 3 sets - 10 reps - Seated Hip Internal Rotation AROM  - 1 x daily - 7 x weekly - 3 sets - 10 reps - Standing ITB Stretch  - 1 x daily - 7 x weekly - 1 sets - 10 reps - 10-20 sec hold - Kneeling Adductor Stretch with Hip External Rotation  - 1 x daily - 7 x weekly - 1 sets - 3-5 reps - 5-10 sec hold - Supine Hip Adductor Stretch  - 1 x daily - 7 x weekly - 1 sets - 1-3 reps - 1-2 min hold - Standing Lumbar Extension  - 1 x daily - 7 x weekly - 1 sets - 5-10 reps - 5-10 sec hold  ASSESSMENT:  CLINICAL IMPRESSION: Patient instructed in self-massage and mobilization to glutes with myofascial ball; increased discomfort along lower leg on R with hip rotation during MWM exercise. Adductor stretches  incorporated to address groin tension and alleviate low back discomfort. Patient reported significant decrease in low lumbar pain (see subjective) at end of session, however reports "dull ache" at lower thoracic spine. Recommended patient perform stretches before bed to promote relaxation and perform strengthening HEP earlier in day.  Eval: Patient is a 67 y.o. male who was seen today for physical therapy evaluation and treatment for  Spondylolisthesis at L5-S1 level and   Chronic bilateral low back pain with right-sided sciatica  .   OBJECTIVE IMPAIRMENTS: Abnormal gait, decreased activity tolerance, decreased endurance, decreased mobility, decreased ROM, hypomobility, increased fascial restrictions, increased muscle spasms, impaired flexibility, improper body mechanics, postural dysfunction, and pain.   GOALS: Goals reviewed with patient? Yes  SHORT TERM GOALS: Target date: 02/10/2023  Independent in initial HEP  Baseline: Goal status: INITIAL  2.  Decrease LB and R LE pain by 25-50% allowing patient to return to normal activities  Baseline:  Goal status: INITIAL   LONG TERM GOALS: Target date: 03/24/2023  Increase hamstring flexibility to ~ 75 deg bilat  Baseline:  Goal status: INITIAL  2.  Trunk ROM/mobility WFL's and minimal to no pain  Baseline:  Goal status: INITIAL  3.  Increase strength R LE to 4+/5 to 5/5  Baseline:  Goal status: INITIAL  4.  Patient reports return to regular activities including regular exercise - yoga and water exercises Baseline:  Goal status: INITIAL  5.  Independent in HEP including aquatic program as indicated  Baseline:  Goal status: INITIAL  6.  Improve functional limitation score to 48 Baseline:  Goal status: INITIAL  PLAN:  PT FREQUENCY: 2x/week  PT DURATION: 12 weeks  PLANNED INTERVENTIONS: Therapeutic exercises, Therapeutic activity, Neuromuscular re-education, Balance training, Gait training, Patient/Family education, Self  Care, Joint mobilization, Aquatic Therapy, Dry Needling, Electrical stimulation, Spinal mobilization, Cryotherapy, Moist heat, Taping, Traction, Ultrasound, Ionotophoresis 4mg /ml Dexamethasone, Manual therapy, and Re-evaluation.  PLAN FOR NEXT SESSION: Progress exercises; continue efforts to educate patient re back care and body mechanics; manual work, DN, modalities as indicated.    Sanjuana Mae, PTA 01/27/2023, 3:46 PM

## 2023-01-28 DIAGNOSIS — M542 Cervicalgia: Secondary | ICD-10-CM | POA: Diagnosis not present

## 2023-02-02 ENCOUNTER — Ambulatory Visit
Payer: Commercial Managed Care - PPO | Attending: Family Medicine | Admitting: Rehabilitative and Restorative Service Providers"

## 2023-02-02 ENCOUNTER — Encounter: Payer: Self-pay | Admitting: Rehabilitative and Restorative Service Providers"

## 2023-02-02 DIAGNOSIS — G8929 Other chronic pain: Secondary | ICD-10-CM | POA: Insufficient documentation

## 2023-02-02 DIAGNOSIS — M6281 Muscle weakness (generalized): Secondary | ICD-10-CM | POA: Insufficient documentation

## 2023-02-02 DIAGNOSIS — M5441 Lumbago with sciatica, right side: Secondary | ICD-10-CM | POA: Diagnosis not present

## 2023-02-02 DIAGNOSIS — R29898 Other symptoms and signs involving the musculoskeletal system: Secondary | ICD-10-CM | POA: Insufficient documentation

## 2023-02-02 DIAGNOSIS — M4317 Spondylolisthesis, lumbosacral region: Secondary | ICD-10-CM | POA: Insufficient documentation

## 2023-02-02 NOTE — Therapy (Signed)
OUTPATIENT PHYSICAL THERAPY THORACOLUMBAR TREATMENT   Patient Name: Reginald Rios MRN: [id number] DOB:05-Jan-1956, 67 y.o., male Today's Date: 02/02/2023  END OF SESSION:  PT End of Session - 02/02/23 1319     Visit Number 8    Number of Visits 24    Date for PT Re-Evaluation 03/24/23    Authorization Type [employer] $20 copay    PT Start Time 1315    PT Stop Time 1403    PT Time Calculation (min) 48 min    Activity Tolerance Patient tolerated treatment well            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   BPPV (benign paroxysmal positional vertigo) 08/14/2014   CAD (coronary artery disease)    2/19 PCI/DESx1 to Lcx   Cervical disc disorder with radiculopathy of cervical region 10/07/2015   Cervical radicular pain 01/10/2019   Chronic cough 04/13/2019   Decreased cardiac ejection fraction 04/02/2015   Diabetes mellitus without complication (HCC)    Dyspnea on exertion 05/15/2015   Erectile dysfunction 07/11/2019   Exercise-induced asthma    Family history of early CAD 07/05/2017   Fatigue 05/15/2015   Hallux rigidus, right foot 09/15/2021   Heart murmur    Hemarthrosis of right elbow 02/03/2016   Hoarseness 04/13/2019   Hyperlipidemia    family hx of high cholesterol   Hypogonadism in male    Incomplete rotator cuff tear 09/10/2015   Injected 09/10/2015   Lateral epicondylitis of right elbow 07/28/2019   Low back pain at multiple sites 02/05/2021   Low vitamin D level 04/22/2017   Mild persistent asthma/cough variant asthma 04/13/2019   Myofascial pain  syndrome, cervical 07/28/2019   Nonischemic cardiomyopathy (HCC) 05/15/2015   Nontraumatic incomplete tear of right rotator cuff 07/25/2019   Onychomycosis 05/25/2018   Osteoarthritis    Pain in right ankle and joints of right foot 10/05/2016   Palpitations 07/06/2017   Shoulder bursitis 10/12/2014   Injected in 10/12/2014  Repeat 01/21/2015   Small thenar eminence 02/03/2016   Spinal stenosis, lumbar region, without neurogenic claudication 10/12/2014   Spondylolisthesis at L5-S1 level 05/09/2021   Strain of latissimus dorsi muscle 01/21/2015   Tendinopathy of right biceps tendon 07/25/2019   Type 2 diabetes mellitus with hyperglycemia, without long-term current use of insulin (HCC) 12/22/2013   Ulnar neuropathy at elbow of right upper extremity 01/03/2019   Past Surgical History:  Procedure Laterality Date   CORONARY PRESSURE/FFR STUDY N/A 06/23/2017   Procedure: INTRAVASCULAR PRESSURE WIRE/FFR STUDY;  Surgeon: Marykay Lex, MD;  Location: [facility] INVASIVE CV LAB;  Service: Cardiovascular;  Laterality: N/A;   CORONARY STENT INTERVENTION N/A 06/23/2017   Procedure: CORONARY STENT INTERVENTION;  Surgeon: Marykay Lex, MD;  Location: [facility] INVASIVE CV LAB;  Service: Cardiovascular;  Laterality: N/A;   KNEE ARTHROSCOPY     LEFT HEART CATH AND CORONARY ANGIOGRAPHY N/A 06/23/2017   Procedure: LEFT HEART CATH AND CORONARY ANGIOGRAPHY;  Surgeon: Marykay Lex, MD;  Location: [facility] INVASIVE CV LAB;  Service: Cardiovascular;  Laterality: N/A;   SHOULDER ARTHROSCOPY WITH BICEPSTENOTOMY Right 09/06/2019  Procedure: SHOULDER ARTHROSCOPY WITH BICEPSTENOTOMY;  Surgeon: Tarry Kos, MD;  Location: [facility];  Service: Orthopedics;  Laterality: Right;   SHOULDER ARTHROSCOPY WITH SUBACROMIAL DECOMPRESSION Right 09/06/2019   Procedure: RIGHT SHOULDER ARTHROSCOPY WITH EXTENSIVE DEBRIDEMENT, SUBACROMIAL DECOMPRESSION, BICEPS TENOTOMY;  Surgeon: Tarry Kos, MD;  Location: [facility];  Service: Orthopedics;  Laterality: Right;   TENOTOMY ACHILLES TENDON     Patient Active Problem List   Diagnosis Date Noted   Hallux rigidus, right foot 09/15/2021   Spondylolisthesis at L5-S1 level 05/09/2021   Low back pain at multiple sites 02/05/2021   Osteoarthritis    Heart murmur    Exercise-induced asthma    Diabetes mellitus without complication (HCC)    Arthritis    Anemia    Myofascial pain syndrome, cervical 07/28/2019   Lateral epicondylitis of right elbow 07/28/2019   Nontraumatic incomplete tear of right rotator cuff 07/25/2019   Tendinopathy of right biceps tendon 07/25/2019   Erectile dysfunction 07/11/2019   Mild persistent asthma/cough variant asthma 04/13/2019   Allergic rhinitis with a possible nonallergic component 04/13/2019   Acid reflux 04/13/2019   Chronic cough 04/13/2019   Hoarseness 04/13/2019   Cervical radicular pain 01/10/2019   Ulnar neuropathy at elbow of right upper extremity 01/03/2019   Acute bilateral low back pain without sciatica 01/03/2019   Onychomycosis 05/25/2018   Palpitations 07/06/2017   CAD (coronary artery disease) 07/05/2017   Family history of early CAD 07/05/2017   Abnormal findings on diagnostic imaging of cardiovascular system 06/23/2017   Low vitamin D level 04/22/2017   Pain in right ankle and joints of right foot 10/05/2016   Hemarthrosis of right elbow 02/03/2016   Small thenar eminence 02/03/2016   Cervical disc disorder with radiculopathy of cervical region 10/07/2015   Incomplete rotator cuff tear 09/10/2015   Nonischemic cardiomyopathy (HCC) 05/15/2015   Fatigue 05/15/2015   Dyspnea on exertion 05/15/2015   Decreased cardiac ejection fraction 04/02/2015   Strain of latissimus dorsi muscle 01/21/2015   Shoulder bursitis 10/12/2014   Spinal stenosis, lumbar region, without neurogenic claudication 10/12/2014   BPPV (benign paroxysmal positional vertigo) 08/14/2014   Hyperlipidemia 01/19/2014   Anemia,  unspecified 01/19/2014   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   Type 2 diabetes mellitus with hyperglycemia, without long-term current use of insulin (HCC) 12/22/2013   Hypogonadism in male 12/22/2013   Arthritis of hand, left 12/22/2013    PCP: Dr Arva Chafe, DO  REFERRING PROVIDER: Dr Rodolph Bong   REFERRING DIAG:  Spondylolisthesis at L5-S1 level  Chronic bilateral low back pain with right-sided sciatica    Rationale for Evaluation and Treatment: Rehabilitation  THERAPY DIAG:  Spondylolisthesis at L5-S1 level  Chronic bilateral low back pain with right-sided sciatica  Other symptoms and signs involving the musculoskeletal system  Muscle weakness (generalized)  ONSET DATE: 12/12/22  SUBJECTIVE:  SUBJECTIVE STATEMENT:  Patient reports he has some  "sharp" pain across center of low back but less pain along lateral R lower leg. Now having no pain in the R LE. He has been doing exercises and using a foam cylinder to do myofacial release which helps some. He has no vertigo symptoms   PERTINENT HISTORY:  Chronic LBP; bilat hamstring tightness; bilat knees; cervical disc surgery; AODM   PAIN:  Are you having pain? Yes: NPRS scale: 6/10 R LB; 8-9/10 R LB at night  Pain location: LB; R leg Pain description: dull aching in the LB;; sharp in the leg  Aggravating factors: back - sitting > 10 min; leg - turning foot/twisting  Relieving factors: meds   PRECAUTIONS: None    WEIGHT BEARING RESTRICTIONS: No  FALLS:  Has patient fallen in last 6 months? No  LIVING ENVIRONMENT: Lives with: lives with their spouse Lives in: House/apartment Stairs: Yes: Internal: 24 steps; on right going up and External: 3 steps; on right going up Has following equipment at home:  None  OCCUPATION: retired [employer] officer, [employer]; active playing pickle ball    PATIENT GOALS: stop most of the pain; get back to activities   NEXT MD VISIT: 02/09/23  OBJECTIVE:   DIAGNOSTIC FINDINGS:  Xray 12/29/22 - results unavailable   PATIENT SURVEYS:  FOTO 31; goals 48    SENSATION: WFL  MUSCLE LENGTH: Hamstrings: Right 70 deg; Left 65 deg Tight hip flexors bilat   POSTURE: rounded shoulders, forward head, decreased lumbar lordosis, increased thoracic kyphosis, flexed trunk , and weight shift right  PALPATION: Pain with PA mobs lumbar spine; R lumbar paraspinals; posterior hip in gluts and piriformis; hamstrings   LUMBAR ROM:   AROM eval  Flexion 70% pain bilat LB  Extension 10% pain R mid back   Right lateral flexion 65% pull L > R LB  Left lateral flexion 60% pull R LB  Right rotation 20% pain mid back  Left rotation 20% discomfort mid back    (Blank rows = not tested)  LOWER EXTREMITY ROM:     Active Assistive Right eval Left eval  Hip flexion    Hip extension 0 0  Hip abduction    Hip adduction    Hip internal rotation    Hip external rotation    Knee flexion 90 95  Knee extension -5 -5  Ankle dorsiflexion 0 0  Ankle plantarflexion    Ankle inversion    Ankle eversion     (Blank rows = not tested)  LOWER EXTREMITY MMT:    MMT Right eval Left eval  Hip flexion 5- 5  Hip extension 4- 5-  Hip abduction 3+ 4+  Hip adduction    Hip internal rotation    Hip external rotation    Knee flexion 5 5  Knee extension 5 5  Ankle dorsiflexion    Ankle plantarflexion    Ankle inversion    Ankle eversion     (Blank rows = not tested)  LUMBAR SPECIAL TESTS:  Straight leg raise test: Negative and Slump test: Negative  FUNCTIONAL TESTS:  SLS: R 2 sec ; L 1 sec  5 times sit to stand: 23.5 sec  GAIT: Distance walked: 40  Assistive device utilized: None Level of assistance: Complete Independence Comments: flexed forward at trunk and hips    [facility] Adult  PT Treatment:  DATE: 02/02/2023 Therapeutic Exercise: Prone prop on forearms to pillow ~ 2-3 min  Quad stretch with strap 30 sec x 3 R/L  Cat cow 10 sec pause x 10  Supine HS stretch with strap 30 sec x 2 R/L cues to engage core  Manual Therapy: Passive R knee flexion/rotation (prone)  Skilled palpation to assess response to manual work and DN Trigger Point Dry-Needling  Treatment instructions: Expect mild to moderate muscle soreness. Patient verbalized understanding of these instructions and education. Patient Consent Given: Yes Education handout provided: Previously provided Muscles treated: R/L QL and lumbar paraspinals L4/5    Electrical stimulation performed: Yes Parameters: mAmp current intensity to patient tolerance  Treatment response/outcome: decreased palpable tightness    [facility] Adult PT Treatment:                                                DATE: 01/27/2023 Therapeutic Exercise: Standing spinal extension stretch Glute med MWM side lying (propped on elbow) with red 4" ball: leg lifts, knee bends, clamshell (bothered lower leg) SKTC x6 Hooklying HS stretch --> sciatic nerve glide --> peroneal nerve glide (R) Small range supine bridges 2x10 Supported butterfly stretch (add) x Self Care: Self-Epley handout    [facility] Adult PT Treatment:                                                DATE: 01/21/2023 Therapeutic Exercise: NuStep L7 x10 min Supine HS stretch with strap --> cues to decrease postural tightness and improve alignment S/L reverse clamshells 2x10 (R) S/L straight leg hip abd in IR 2x10 (R) Manual Therapy: IASTM R glute med Passive R knee flexion/rotation (prone) + pin and stretch glute med/piriformis Skilled palpation to assess response to manual work and DN Trigger Point Dry-Needling  Treatment instructions: Expect mild to moderate muscle soreness. S/S of pneumothorax if dry needled over a lung field, and to  seek immediate medical attention should they occur. Patient verbalized understanding of these instructions and education.  Patient Consent Given: Yes Education handout provided: Previously provided Muscles treated: R hamstring; inferior iliac crest, QL, lateral gastroc   Electrical stimulation performed: Yes Parameters: mAmp current intensity to patient tolerance  Treatment response/outcome: decreased palpable tightness    [facility] Adult PT Treatment:                                                DATE: 01/18/2023 Therapeutic Exercise: NuStep L6 x 6 min Supine: Sciatic nerve glide x10  Peroneal nerve glide x10 Bridges + ball squeeze 2x10 Modified figure 4 stretch --> pulling knee to opp shoulder 3x30" (B) S/L clamshell RTB to fatigue (30 reps) S/L hip ext in abd x10 Seated hip IR AROM with slider 2x10 (R), x10 (L) Standing forward bend with legs crossed (ITB) Step ups 6" step front Lateral step up 4"    PATIENT EDUCATION:  Education details: POC; HEP Person educated: Patient Education method: Explanation, Demonstration, Tactile cues, Verbal cues, and Handouts Education comprehension: verbalized understanding, returned demonstration, verbal cues required, tactile cues required, and needs further education  HOME EXERCISE  PROGRAM:  Access Code: [email or web address] URL: [email or web address] Date: 02/02/2023 Prepared by: Corlis Leak  Exercises - Hooklying Hamstring Stretch with Strap  - 2 x daily - 7 x weekly - 1 sets - 3 reps - 30 sec  hold - Supine ITB Stretch with Strap  - 2 x daily - 7 x weekly - 1 sets - 3 reps - 30 sec  hold - Supine Piriformis Stretch with Leg Straight  - 2 x daily - 7 x weekly - 1 sets - 3 reps - 30 sec  hold - Supine Transversus Abdominis Bracing with Pelvic Floor Contraction  - 2 x daily - 7 x weekly - 1 sets - 10 reps - 10sec  hold - Supine Peroneal Nerve Glide  - 1 x daily - 7 x weekly - 3 sets - 10 reps - Supine Sciatic Nerve Glide  - 1 x daily - 7  x weekly - 3 sets - 10 reps - Supine Bridge with Mini Swiss Ball Between Knees  - 1 x daily - 7 x weekly - 3 sets - 10 reps - Seated Self Great Toe Stretch  - 1 x daily - 7 x weekly - 3 sets - 10 reps - Seated Hip Internal Rotation AROM  - 1 x daily - 7 x weekly - 3 sets - 10 reps - Standing ITB Stretch  - 1 x daily - 7 x weekly - 1 sets - 10 reps - 10-20 sec hold - Kneeling Adductor Stretch with Hip External Rotation  - 1 x daily - 7 x weekly - 1 sets - 3-5 reps - 5-10 sec hold - Supine Hip Adductor Stretch  - 1 x daily - 7 x weekly - 1 sets - 1-3 reps - 1-2 min hold - Standing Lumbar Extension  - 1 x daily - 7 x weekly - 1 sets - 5-10 reps - 5-10 sec hold - Cat Cow  - 2 x daily - 7 x weekly - 1 sets - 5-10 reps - 3-5 sec  hold ASSESSMENT:  CLINICAL IMPRESSION: Patient has done well with self-massage and mobilization to glutes with a foam cylinder he has at home. Symptoms are centralized to bilat LB about evenly. Trial of DN with estim to QL and lower lumbar paraspinals, L4/5. Patient is gradually progressing toward stated goals of therapy.   Eval: Patient is a 67 y.o. male who was seen today for physical therapy evaluation and treatment for  Spondylolisthesis at L5-S1 level and   Chronic bilateral low back pain with right-sided sciatica  .   OBJECTIVE IMPAIRMENTS: Abnormal gait, decreased activity tolerance, decreased endurance, decreased mobility, decreased ROM, hypomobility, increased fascial restrictions, increased muscle spasms, impaired flexibility, improper body mechanics, postural dysfunction, and pain.   GOALS: Goals reviewed with patient? Yes  SHORT TERM GOALS: Target date: 02/10/2023  Independent in initial HEP  Baseline: Goal status: INITIAL  2.  Decrease LB and R LE pain by 25-50% allowing patient to return to normal activities  Baseline:  Goal status: INITIAL   LONG TERM GOALS: Target date: 03/24/2023  Increase hamstring flexibility to ~ 75 deg bilat  Baseline:   Goal status: INITIAL  2.  Trunk ROM/mobility WFL's and minimal to no pain  Baseline:  Goal status: INITIAL  3.  Increase strength R LE to 4+/5 to 5/5  Baseline:  Goal status: INITIAL  4.  Patient reports return to regular activities including regular exercise - yoga and water exercises Baseline:  Goal status: INITIAL  5.  Independent in HEP including aquatic program as indicated  Baseline:  Goal status: INITIAL  6.  Improve functional limitation score to 48 Baseline:  Goal status: INITIAL  PLAN:  PT FREQUENCY: 2x/week  PT DURATION: 12 weeks  PLANNED INTERVENTIONS: Therapeutic exercises, Therapeutic activity, Neuromuscular re-education, Balance training, Gait training, Patient/Family education, Self Care, Joint mobilization, Aquatic Therapy, Dry Needling, Electrical stimulation, Spinal mobilization, Cryotherapy, Moist heat, Taping, Traction, Ultrasound, Ionotophoresis 4mg /ml Dexamethasone, Manual therapy, and Re-evaluation.  PLAN FOR NEXT SESSION: Progress exercises; continue efforts to educate patient re back care and body mechanics; manual work, DN, modalities as indicated.    Mitali Shenefield Rober Minion, PT 02/02/2023, 1:20 PM

## 2023-02-03 ENCOUNTER — Other Ambulatory Visit: Payer: Self-pay | Admitting: Internal Medicine

## 2023-02-03 ENCOUNTER — Other Ambulatory Visit (HOSPITAL_BASED_OUTPATIENT_CLINIC_OR_DEPARTMENT_OTHER): Payer: Self-pay

## 2023-02-03 ENCOUNTER — Other Ambulatory Visit (HOSPITAL_COMMUNITY): Payer: Self-pay

## 2023-02-03 ENCOUNTER — Other Ambulatory Visit: Payer: Self-pay

## 2023-02-03 DIAGNOSIS — J45990 Exercise induced bronchospasm: Secondary | ICD-10-CM

## 2023-02-03 MED ORDER — ALBUTEROL SULFATE HFA 108 (90 BASE) MCG/ACT IN AERS
2.0000 | INHALATION_SPRAY | RESPIRATORY_TRACT | 1 refills | Status: DC
  Filled 2023-02-03 (×2): qty 6.7, 20d supply, fill #0

## 2023-02-04 ENCOUNTER — Ambulatory Visit: Payer: Commercial Managed Care - PPO

## 2023-02-04 DIAGNOSIS — R29898 Other symptoms and signs involving the musculoskeletal system: Secondary | ICD-10-CM | POA: Diagnosis not present

## 2023-02-04 DIAGNOSIS — G8929 Other chronic pain: Secondary | ICD-10-CM

## 2023-02-04 DIAGNOSIS — M4317 Spondylolisthesis, lumbosacral region: Secondary | ICD-10-CM | POA: Diagnosis not present

## 2023-02-04 DIAGNOSIS — M6281 Muscle weakness (generalized): Secondary | ICD-10-CM | POA: Diagnosis not present

## 2023-02-04 DIAGNOSIS — M5441 Lumbago with sciatica, right side: Secondary | ICD-10-CM | POA: Diagnosis not present

## 2023-02-04 NOTE — Therapy (Signed)
OUTPATIENT PHYSICAL THERAPY THORACOLUMBAR TREATMENT   Patient Name: Reginald Rios MRN: [id number] DOB:03/22/1956, 68 y.o., male Today's Date: 02/04/2023  END OF SESSION:  PT End of Session - 02/04/23 1314     Visit Number 9    Number of Visits 24    Date for PT Re-Evaluation 03/24/23    Authorization Type [employer] $20 copay    PT Start Time 1315    PT Stop Time 1402    PT Time Calculation (min) 47 min    Activity Tolerance Patient tolerated treatment well    Behavior During Therapy [facility] for tasks assessed/performed            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   BPPV (benign paroxysmal positional vertigo) 08/14/2014   CAD (coronary artery disease)    2/19 PCI/DESx1 to Lcx   Cervical disc disorder with radiculopathy of cervical region 10/07/2015   Cervical radicular pain 01/10/2019   Chronic cough 04/13/2019   Decreased cardiac ejection fraction 04/02/2015   Diabetes mellitus without complication (HCC)    Dyspnea on exertion 05/15/2015   Erectile dysfunction 07/11/2019   Exercise-induced asthma    Family history of early CAD 07/05/2017   Fatigue 05/15/2015   Hallux rigidus, right foot 09/15/2021   Heart murmur    Hemarthrosis of right elbow 02/03/2016   Hoarseness 04/13/2019   Hyperlipidemia    family hx of high cholesterol   Hypogonadism in male    Incomplete rotator cuff tear 09/10/2015   Injected 09/10/2015   Lateral epicondylitis of right elbow 07/28/2019   Low back pain at multiple sites 02/05/2021   Low vitamin D level 04/22/2017   Mild  persistent asthma/cough variant asthma 04/13/2019   Myofascial pain syndrome, cervical 07/28/2019   Nonischemic cardiomyopathy (HCC) 05/15/2015   Nontraumatic incomplete tear of right rotator cuff 07/25/2019   Onychomycosis 05/25/2018   Osteoarthritis    Pain in right ankle and joints of right foot 10/05/2016   Palpitations 07/06/2017   Shoulder bursitis 10/12/2014   Injected in 10/12/2014  Repeat 01/21/2015   Small thenar eminence 02/03/2016   Spinal stenosis, lumbar region, without neurogenic claudication 10/12/2014   Spondylolisthesis at L5-S1 level 05/09/2021   Strain of latissimus dorsi muscle 01/21/2015   Tendinopathy of right biceps tendon 07/25/2019   Type 2 diabetes mellitus with hyperglycemia, without long-term current use of insulin (HCC) 12/22/2013   Ulnar neuropathy at elbow of right upper extremity 01/03/2019   Past Surgical History:  Procedure Laterality Date   CORONARY PRESSURE/FFR STUDY N/A 06/23/2017   Procedure: INTRAVASCULAR PRESSURE WIRE/FFR STUDY;  Surgeon: Marykay Lex, MD;  Location: [facility] INVASIVE CV LAB;  Service: Cardiovascular;  Laterality: N/A;   CORONARY STENT INTERVENTION N/A 06/23/2017   Procedure: CORONARY STENT INTERVENTION;  Surgeon: Marykay Lex, MD;  Location: [facility] INVASIVE CV LAB;  Service: Cardiovascular;  Laterality: N/A;   KNEE ARTHROSCOPY     LEFT HEART CATH AND CORONARY ANGIOGRAPHY N/A 06/23/2017   Procedure: LEFT HEART CATH AND CORONARY ANGIOGRAPHY;  Surgeon: Marykay Lex, MD;  Location: [facility] INVASIVE CV LAB;  Service: Cardiovascular;  Laterality:  N/A;   SHOULDER ARTHROSCOPY WITH BICEPSTENOTOMY Right 09/06/2019   Procedure: SHOULDER ARTHROSCOPY WITH BICEPSTENOTOMY;  Surgeon: Tarry Kos, MD;  Location: [facility];  Service: Orthopedics;  Laterality: Right;   SHOULDER ARTHROSCOPY WITH SUBACROMIAL DECOMPRESSION Right 09/06/2019   Procedure: RIGHT SHOULDER ARTHROSCOPY WITH EXTENSIVE DEBRIDEMENT, SUBACROMIAL DECOMPRESSION, BICEPS  TENOTOMY;  Surgeon: Tarry Kos, MD;  Location: [facility];  Service: Orthopedics;  Laterality: Right;   TENOTOMY ACHILLES TENDON     Patient Active Problem List   Diagnosis Date Noted   Hallux rigidus, right foot 09/15/2021   Spondylolisthesis at L5-S1 level 05/09/2021   Low back pain at multiple sites 02/05/2021   Osteoarthritis    Heart murmur    Exercise-induced asthma    Diabetes mellitus without complication (HCC)    Arthritis    Anemia    Myofascial pain syndrome, cervical 07/28/2019   Lateral epicondylitis of right elbow 07/28/2019   Nontraumatic incomplete tear of right rotator cuff 07/25/2019   Tendinopathy of right biceps tendon 07/25/2019   Erectile dysfunction 07/11/2019   Mild persistent asthma/cough variant asthma 04/13/2019   Allergic rhinitis with a possible nonallergic component 04/13/2019   Acid reflux 04/13/2019   Chronic cough 04/13/2019   Hoarseness 04/13/2019   Cervical radicular pain 01/10/2019   Ulnar neuropathy at elbow of right upper extremity 01/03/2019   Acute bilateral low back pain without sciatica 01/03/2019   Onychomycosis 05/25/2018   Palpitations 07/06/2017   CAD (coronary artery disease) 07/05/2017   Family history of early CAD 07/05/2017   Abnormal findings on diagnostic imaging of cardiovascular system 06/23/2017   Low vitamin D level 04/22/2017   Pain in right ankle and joints of right foot 10/05/2016   Hemarthrosis of right elbow 02/03/2016   Small thenar eminence 02/03/2016   Cervical disc disorder with radiculopathy of cervical region 10/07/2015   Incomplete rotator cuff tear 09/10/2015   Nonischemic cardiomyopathy (HCC) 05/15/2015   Fatigue 05/15/2015   Dyspnea on exertion 05/15/2015   Decreased cardiac ejection fraction 04/02/2015   Strain of latissimus dorsi muscle 01/21/2015   Shoulder bursitis 10/12/2014   Spinal stenosis, lumbar region, without neurogenic claudication 10/12/2014   BPPV (benign paroxysmal  positional vertigo) 08/14/2014   Hyperlipidemia 01/19/2014   Anemia, unspecified 01/19/2014   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   Type 2 diabetes mellitus with hyperglycemia, without long-term current use of insulin (HCC) 12/22/2013   Hypogonadism in male 12/22/2013   Arthritis of hand, left 12/22/2013    PCP: Dr Arva Chafe, DO  REFERRING PROVIDER: Dr Rodolph Bong   REFERRING DIAG:  Spondylolisthesis at L5-S1 level  Chronic bilateral low back pain with right-sided sciatica    Rationale for Evaluation and Treatment: Rehabilitation  THERAPY DIAG:  Spondylolisthesis at L5-S1 level  Chronic bilateral low back pain with right-sided sciatica  Other symptoms and signs involving the musculoskeletal system  Muscle weakness (generalized)  ONSET DATE: 12/12/22  SUBJECTIVE:  SUBJECTIVE STATEMENT:  Patient reports he has been using massage balls at home and says they have been helping.   PERTINENT HISTORY:  Chronic LBP; bilat hamstring tightness; bilat knees; cervical disc surgery; AODM   PAIN:  Are you having pain? Yes: NPRS scale: 6/10 R LB; 8-9/10 R LB at night  Pain location: LB; R leg Pain description: dull aching in the LB;; sharp in the leg  Aggravating factors: back - sitting > 10 min; leg - turning foot/twisting  Relieving factors: meds   PRECAUTIONS: None    WEIGHT BEARING RESTRICTIONS: No  FALLS:  Has patient fallen in last 6 months? No  LIVING ENVIRONMENT: Lives with: lives with their spouse Lives in: House/apartment Stairs: Yes: Internal: 24 steps; on right going up and External: 3 steps; on right going up Has following equipment at home: None  OCCUPATION: retired Conservation officer, [employer]; active playing pickle ball    PATIENT GOALS: stop most of  the pain; get back to activities   NEXT MD VISIT: 02/09/23  OBJECTIVE:   DIAGNOSTIC FINDINGS:  Xray 12/29/22 - results unavailable   PATIENT SURVEYS:  FOTO 31; goals 48    SENSATION: WFL  MUSCLE LENGTH: Hamstrings: Right 70 deg; Left 65 deg Tight hip flexors bilat   POSTURE: rounded shoulders, forward head, decreased lumbar lordosis, increased thoracic kyphosis, flexed trunk , and weight shift right  PALPATION: Pain with PA mobs lumbar spine; R lumbar paraspinals; posterior hip in gluts and piriformis; hamstrings   LUMBAR ROM:   AROM eval  Flexion 70% pain bilat LB  Extension 10% pain R mid back   Right lateral flexion 65% pull L > R LB  Left lateral flexion 60% pull R LB  Right rotation 20% pain mid back  Left rotation 20% discomfort mid back    (Blank rows = not tested)  LOWER EXTREMITY ROM:     Active Assistive Right eval Left eval  Hip flexion    Hip extension 0 0  Hip abduction    Hip adduction    Hip internal rotation    Hip external rotation    Knee flexion 90 95  Knee extension -5 -5  Ankle dorsiflexion 0 0  Ankle plantarflexion    Ankle inversion    Ankle eversion     (Blank rows = not tested)  LOWER EXTREMITY MMT:    MMT Right eval Left eval  Hip flexion 5- 5  Hip extension 4- 5-  Hip abduction 3+ 4+  Hip adduction    Hip internal rotation    Hip external rotation    Knee flexion 5 5  Knee extension 5 5  Ankle dorsiflexion    Ankle plantarflexion    Ankle inversion    Ankle eversion     (Blank rows = not tested)  LUMBAR SPECIAL TESTS:  Straight leg raise test: Negative and Slump test: Negative  FUNCTIONAL TESTS:  SLS: R 2 sec ; L 1 sec  5 times sit to stand: 23.5 sec  GAIT: Distance walked: 40  Assistive device utilized: None Level of assistance: Complete Independence Comments: flexed forward at trunk and hips    [facility] Adult PT Treatment:                                                DATE: 02/04/2023 Therapeutic  Exercise: Doorway QL stretch (R)  Figure  4 LTR 10x5" (B) SKTC Dead bug with knee tap Isometric dead bug  (table top) holds 5x10" Prone: Hip IR AROM DL --> SL hip IR/ER Bent knee heel squeeze 5x5" Quadruped bent leg hip extension 2x5 (B)    [facility] Adult PT Treatment:                                                DATE: 02/02/2023 Therapeutic Exercise: Prone prop on forearms to pillow ~ 2-3 min  Quad stretch with strap 30 sec x 3 R/L  Cat cow 10 sec pause x 10  Supine HS stretch with strap 30 sec x 2 R/L cues to engage core  Manual Therapy: Passive R knee flexion/rotation (prone)  Skilled palpation to assess response to manual work and DN Trigger Point Dry-Needling  Treatment instructions: Expect mild to moderate muscle soreness. Patient verbalized understanding of these instructions and education. Patient Consent Given: Yes Education handout provided: Previously provided Muscles treated: R/L QL and lumbar paraspinals L4/5    Electrical stimulation performed: Yes Parameters: mAmp current intensity to patient tolerance  Treatment response/outcome: decreased palpable tightness    [facility] Adult PT Treatment:                                                DATE: 01/27/2023 Therapeutic Exercise: Standing spinal extension stretch Glute med MWM side lying (propped on elbow) with red 4" ball: leg lifts, knee bends, clamshell (bothered lower leg) SKTC x6 Hooklying HS stretch --> sciatic nerve glide --> peroneal nerve glide (R) Small range supine bridges 2x10 Supported butterfly stretch (add) x Self Care: Self-Epley handout    [facility] Adult PT Treatment:                                                DATE: 01/21/2023 Therapeutic Exercise: NuStep L7 x10 min Supine HS stretch with strap --> cues to decrease postural tightness and improve alignment S/L reverse clamshells 2x10 (R) S/L straight leg hip abd in IR 2x10 (R) Manual Therapy: IASTM R glute med Passive R knee flexion/rotation  (prone) + pin and stretch glute med/piriformis Skilled palpation to assess response to manual work and DN Trigger Point Dry-Needling  Treatment instructions: Expect mild to moderate muscle soreness. S/S of pneumothorax if dry needled over a lung field, and to seek immediate medical attention should they occur. Patient verbalized understanding of these instructions and education.  Patient Consent Given: Yes Education handout provided: Previously provided Muscles treated: R hamstring; inferior iliac crest, QL, lateral gastroc   Electrical stimulation performed: Yes Parameters: mAmp current intensity to patient tolerance  Treatment response/outcome: decreased palpable tightness    PATIENT EDUCATION:  Education details: POC; HEP Person educated: Patient Education method: Programmer, multimedia, Demonstration, Actor cues, Verbal cues, and Handouts Education comprehension: verbalized understanding, returned demonstration, verbal cues required, tactile cues required, and needs further education  HOME EXERCISE PROGRAM: Access Code: [email or web address] URL: [email or web address] Date: 02/04/2023 Prepared by: Carlynn Herald  Exercises - Isometric Dead Bug  - 2 x daily - 7 x weekly - 1 sets - 10 reps - 5-10 sec  hold - Dead Bug  - 1 x daily - 7 x weekly - 3 sets - 10 reps - Supine Single Knee to Chest Stretch  - 1 x daily - 7 x weekly - 1 sets - 10 reps - 10-20 sec hold - Supine Lower Trunk Rotation  - 1 x daily - 7 x weekly - 3 sets - 10 reps - Supine Hip External Rotation Stretch  - 1 x daily - 7 x weekly - 3 sets - 10 reps - Prone Hip Internal Rotation AROM  - 1 x daily - 7 x weekly - 3 sets - 10 reps - Quadruped Bent Leg Hip Extension  - 1 x daily - 7 x weekly - 3 sets - 10 reps  ASSESSMENT:  CLINICAL IMPRESSION: Session focused on progressing abdominal strength and core stabilization; noted core weakness during isometric dead bug exercise. Cueing provided to improve postural stability in  quadruped position.  Eval: Patient is a 67 y.o. male who was seen today for physical therapy evaluation and treatment for  Spondylolisthesis at L5-S1 level and   Chronic bilateral low back pain with right-sided sciatica  .   OBJECTIVE IMPAIRMENTS: Abnormal gait, decreased activity tolerance, decreased endurance, decreased mobility, decreased ROM, hypomobility, increased fascial restrictions, increased muscle spasms, impaired flexibility, improper body mechanics, postural dysfunction, and pain.   GOALS: Goals reviewed with patient? Yes  SHORT TERM GOALS: Target date: 02/10/2023  Independent in initial HEP  Baseline: Goal status: INITIAL  2.  Decrease LB and R LE pain by 25-50% allowing patient to return to normal activities  Baseline:  Goal status: INITIAL   LONG TERM GOALS: Target date: 03/24/2023  Increase hamstring flexibility to ~ 75 deg bilat  Baseline:  Goal status: INITIAL  2.  Trunk ROM/mobility WFL's and minimal to no pain  Baseline:  Goal status: INITIAL  3.  Increase strength R LE to 4+/5 to 5/5  Baseline:  Goal status: INITIAL  4.  Patient reports return to regular activities including regular exercise - yoga and water exercises Baseline:  Goal status: INITIAL  5.  Independent in HEP including aquatic program as indicated  Baseline:  Goal status: INITIAL  6.  Improve functional limitation score to 48 Baseline:  Goal status: INITIAL  PLAN:  PT FREQUENCY: 2x/week  PT DURATION: 12 weeks  PLANNED INTERVENTIONS: Therapeutic exercises, Therapeutic activity, Neuromuscular re-education, Balance training, Gait training, Patient/Family education, Self Care, Joint mobilization, Aquatic Therapy, Dry Needling, Electrical stimulation, Spinal mobilization, Cryotherapy, Moist heat, Taping, Traction, Ultrasound, Ionotophoresis 4mg /ml Dexamethasone, Manual therapy, and Re-evaluation.  PLAN FOR NEXT SESSION: Progress exercises; continue efforts to educate patient re  back care and body mechanics; manual work, DN, modalities as indicated.    Sanjuana Mae, PTA 02/04/2023, 2:04 PM

## 2023-02-05 ENCOUNTER — Other Ambulatory Visit (HOSPITAL_BASED_OUTPATIENT_CLINIC_OR_DEPARTMENT_OTHER): Payer: Self-pay

## 2023-02-05 DIAGNOSIS — N5201 Erectile dysfunction due to arterial insufficiency: Secondary | ICD-10-CM | POA: Diagnosis not present

## 2023-02-05 MED ORDER — SILDENAFIL CITRATE 100 MG PO TABS
100.0000 mg | ORAL_TABLET | Freq: Every day | ORAL | 3 refills | Status: AC | PRN
  Filled 2023-02-05: qty 20, 20d supply, fill #0
  Filled 2024-01-21: qty 20, 20d supply, fill #1

## 2023-02-05 MED ORDER — COVID-19 MRNA VAC-TRIS(PFIZER) 30 MCG/0.3ML IM SUSY
0.3000 mL | PREFILLED_SYRINGE | Freq: Once | INTRAMUSCULAR | 0 refills | Status: AC
Start: 2023-02-05 — End: 2023-02-06
  Filled 2023-02-05: qty 0.3, 1d supply, fill #0

## 2023-02-07 ENCOUNTER — Other Ambulatory Visit: Payer: Self-pay | Admitting: Cardiology

## 2023-02-08 ENCOUNTER — Other Ambulatory Visit (HOSPITAL_BASED_OUTPATIENT_CLINIC_OR_DEPARTMENT_OTHER): Payer: Self-pay

## 2023-02-08 ENCOUNTER — Other Ambulatory Visit: Payer: Self-pay

## 2023-02-08 MED ORDER — LOSARTAN POTASSIUM 25 MG PO TABS
25.0000 mg | ORAL_TABLET | Freq: Every day | ORAL | 2 refills | Status: DC
  Filled 2023-02-08: qty 90, 90d supply, fill #0
  Filled 2023-05-16: qty 90, 90d supply, fill #1
  Filled 2023-08-09 – 2023-08-13 (×2): qty 90, 90d supply, fill #2

## 2023-02-09 ENCOUNTER — Encounter: Payer: Self-pay | Admitting: Family Medicine

## 2023-02-09 ENCOUNTER — Ambulatory Visit: Payer: Commercial Managed Care - PPO | Admitting: Family Medicine

## 2023-02-09 VITALS — BP 132/74 | HR 75 | Ht 70.0 in | Wt 203.0 lb

## 2023-02-09 DIAGNOSIS — M5441 Lumbago with sciatica, right side: Secondary | ICD-10-CM

## 2023-02-09 DIAGNOSIS — G8929 Other chronic pain: Secondary | ICD-10-CM

## 2023-02-09 NOTE — Patient Instructions (Addendum)
Thank you for coming in today.   Please call [facility] (formally [facility]) at [phone] to schedule your spine injection.    Continue to work on home exercises and core strength.   We could add a consultation with pain management to talk about further injections or procedures if the injection are not working well enough.

## 2023-02-09 NOTE — Progress Notes (Signed)
Rubin Payor, PhD, LAT, ATC acting as a scribe for [name], MD.  Reginald Rios is a 67 y.o. male who presents to [facility] today for f/u LBP w/ MRI review. Pt was last seen by Dr. [name] on 12/29/22 and was prescribed prednisone and referred to PT, completing 9 visits. Based on new L-spine XR findings and MRI was ordered.   Today, pt reports PT has been helpful and he has been stretching and strengthening his LE. Resulting in a decrease in his LBP, 3-4/10. Pain w/ climbing stairs. He notes radiating pain has resolved, but weakness in his R leg.   Dx imaging: 01/24/23 L-spine MRI 12/29/22 L-spine XR 09/14/22 Pelvis MRI             08/25/22 Bilat hips/pelvis XR             08/20/22 L-spine XR             05/03/21 L-spine MRI             02/19/21 L-spine XR  Pertinent review of systems: No fevers or chills  Relevant historical information: Diabetes   Exam:  BP 132/74   Pulse 75   Ht 5\' 10"  (1.778 m)   Wt 203 lb (92.1 kg)   SpO2 97%   BMI 29.13 kg/m  General: Well Developed, well nourished, and in no acute distress.   MSK: L-spine decreased lumbar motion. Lower extremity strength reduced hip abduction bilaterally 4/5.  Otherwise lower extremity strength is generally intact.    Lab and Radiology Results   EXAM: MRI LUMBAR SPINE WITHOUT CONTRAST   TECHNIQUE: Multiplanar, multisequence MR imaging of the lumbar spine was performed. No intravenous contrast was administered.   COMPARISON:  Lumbar spine radiograph 12/29/2022, lumbar spine MRI 05/03/2021   FINDINGS: Segmentation: In keeping with prior numbering convention the last well-formed disc space is labeled L5-S1. There is rudimentary disc space at S1-S2.   Alignment: Trace retrolisthesis of L2 on L3. Grade 1 anterolisthesis of L5 on S1.   Vertebrae:  No fracture, evidence of discitis, or bone lesion.   Conus medullaris and cauda equina: Conus extends to the L1  level. Conus and cauda equina appear normal. The degree of epidural lipomatosis, predominantly at the L4-L5 and L5-S1 level is unchanged from prior exam.   Paraspinal and other soft tissues: Negative.   Disc levels:   T12-L1: Mild bilateral facet degenerative change. No spinal canal or neural foraminal narrowing.   L1-L2: Mild bilateral facet degenerative change. No spinal canal narrowing. Significant disc bulge. Mild bilateral neural foraminal narrowing. Findings are unchanged compared to prior exam from 2022.   L2-L3: Eccentric left disc bulge. Mild bilateral facet degenerative change with fluid in the facet joints. Moderate to severe spinal canal narrowing. Moderate bilateral neural foraminal narrowing, right-greater-than-left. Findings are unchanged from prior exam.   L3-L4: Moderate bilateral facet degenerative change. Circumferential disc bulge. Moderate spinal canal narrowing. Moderate bilateral neural foraminal narrowing. Findings are unchanged from prior exam.   L4-L5: Severe bilateral facet degenerative change. Circumferential disc bulge. Severe spinal canal narrowing. Severe left and moderate right neural foraminal narrowing. Findings are unchanged from prior exam.   L5-S1: Severe bilateral facet degenerative change. Ligamentum flavum hypertrophy. Minimal disc bulge. Moderate to severe spinal canal narrowing. Severe right and moderate left neural foraminal narrowing. Findings are unchanged prior exam.   IMPRESSION: 1. No acute fracture or traumatic listhesis. 2. Unchanged multilevel degenerative changes of the lumbar  spine with severe spinal canal narrowing at L4-L5 and moderate to severe spinal canal narrowing at L2-L3 and L5-S1. 3. Unchanged multilevel neural foraminal narrowing, severe on the left at L4-L5 and on the right at L5-S1.     Electronically Signed   By: Lorenza Cambridge M.D.   On: 02/09/2023 08:38 I, [name], personally (independently)  visualized and performed the interpretation of the images attached in this note.      Assessment and Plan: 67 y.o. male with chronic low back pain with pain radiating down predominantly right leg and a L5 dermatomal pattern.  He does have some component that could be consistent with L3 at times.  He has had intermittent weakness.  I think a lot of his symptoms are due to spinal stenosis and neuroforaminal stenosis seen on most recent MRI.  He may have some piriformis syndrome or hip abductor tendinopathy or weakness as well.  We talked about options.  He is doing pretty well with physical therapy so far so we will continue PT.  Will add an epidural steroid injection.  He has multiple targets but an interlaminar L4-5 is probably a good starting point. [name] will keep me updated as he progresses.  Could consider other injections including facet injections or other epidural steroid injections.  Could consider consultation with pain management regarding other injections or advanced spinal procedures.   PDMP not reviewed this encounter. Orders Placed This Encounter  Procedures   DG INJECT DIAG/THERA/INC NEEDLE/CATH/PLC EPI/LUMB/SAC W/IMG    Standing Status:   Future    Standing Expiration Date:   02/09/2024    Order Specific Question:   Reason for Exam (SYMPTOM  OR DIAGNOSIS REQUIRED)    Answer:   ESI lumbar rad iculopathy and spinal stenosis. Level and technique per radiology. Suggest L4-5 interlaminar    Order Specific Question:   Preferred Imaging Location?    Answer:   [id number]    Order Specific Question:   Radiology Contrast Protocol - do NOT remove file path    Answer:   [email or web address]   No orders of the defined types were placed in this encounter.    Discussed warning signs or symptoms. Please see discharge instructions. Patient expresses understanding.   The above documentation has been reviewed and is accurate and complete [name], M.D. Total encounter time 30 minutes including face-to-face time with the patient and, reviewing past medical record, and charting on the date of service.

## 2023-02-10 ENCOUNTER — Ambulatory Visit: Payer: Commercial Managed Care - PPO

## 2023-02-10 DIAGNOSIS — M6281 Muscle weakness (generalized): Secondary | ICD-10-CM

## 2023-02-10 DIAGNOSIS — R29898 Other symptoms and signs involving the musculoskeletal system: Secondary | ICD-10-CM

## 2023-02-10 DIAGNOSIS — G8929 Other chronic pain: Secondary | ICD-10-CM

## 2023-02-10 DIAGNOSIS — M4317 Spondylolisthesis, lumbosacral region: Secondary | ICD-10-CM | POA: Diagnosis not present

## 2023-02-10 DIAGNOSIS — M5441 Lumbago with sciatica, right side: Secondary | ICD-10-CM | POA: Diagnosis not present

## 2023-02-10 NOTE — Progress Notes (Signed)
As we spoke in clinic yesterday lumbar spine MRI shows areas of arthritis and pinched nerves.

## 2023-02-10 NOTE — Therapy (Signed)
OUTPATIENT PHYSICAL THERAPY THORACOLUMBAR TREATMENT   Patient Name: Reginald Rios MRN: [id number] DOB:Sep 26, 1955, 67 y.o., male Today's Date: 02/10/2023  END OF SESSION:  PT End of Session - 02/10/23 1315     Visit Number 10    Number of Visits 24    Date for PT Re-Evaluation 03/24/23    PT Start Time 1315    PT Stop Time 1400    PT Time Calculation (min) 45 min    Activity Tolerance Patient tolerated treatment well    Behavior During Therapy [facility] for tasks assessed/performed            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   BPPV (benign paroxysmal positional vertigo) 08/14/2014   CAD (coronary artery disease)    2/19 PCI/DESx1 to Lcx   Cervical disc disorder with radiculopathy of cervical region 10/07/2015   Cervical radicular pain 01/10/2019   Chronic cough 04/13/2019   Decreased cardiac ejection fraction 04/02/2015   Diabetes mellitus without complication (HCC)    Dyspnea on exertion 05/15/2015   Erectile dysfunction 07/11/2019   Exercise-induced asthma    Family history of early CAD 07/05/2017   Fatigue 05/15/2015   Hallux rigidus, right foot 09/15/2021   Heart murmur    Hemarthrosis of right elbow 02/03/2016   Hoarseness 04/13/2019   Hyperlipidemia    family hx of high cholesterol   Hypogonadism in male    Incomplete rotator cuff tear 09/10/2015   Injected 09/10/2015   Lateral epicondylitis of right elbow 07/28/2019   Low back pain at multiple sites 02/05/2021   Low vitamin D level 04/22/2017   Mild persistent asthma/cough variant asthma 04/13/2019   Myofascial  pain syndrome, cervical 07/28/2019   Nonischemic cardiomyopathy (HCC) 05/15/2015   Nontraumatic incomplete tear of right rotator cuff 07/25/2019   Onychomycosis 05/25/2018   Osteoarthritis    Pain in right ankle and joints of right foot 10/05/2016   Palpitations 07/06/2017   Shoulder bursitis 10/12/2014   Injected in 10/12/2014  Repeat 01/21/2015   Small thenar eminence 02/03/2016   Spinal stenosis, lumbar region, without neurogenic claudication 10/12/2014   Spondylolisthesis at L5-S1 level 05/09/2021   Strain of latissimus dorsi muscle 01/21/2015   Tendinopathy of right biceps tendon 07/25/2019   Type 2 diabetes mellitus with hyperglycemia, without long-term current use of insulin (HCC) 12/22/2013   Ulnar neuropathy at elbow of right upper extremity 01/03/2019   Past Surgical History:  Procedure Laterality Date   CORONARY PRESSURE/FFR STUDY N/A 06/23/2017   Procedure: INTRAVASCULAR PRESSURE WIRE/FFR STUDY;  Surgeon: Marykay Lex, MD;  Location: [facility] INVASIVE CV LAB;  Service: Cardiovascular;  Laterality: N/A;   CORONARY STENT INTERVENTION N/A 06/23/2017   Procedure: CORONARY STENT INTERVENTION;  Surgeon: Marykay Lex, MD;  Location: [facility] INVASIVE CV LAB;  Service: Cardiovascular;  Laterality: N/A;   KNEE ARTHROSCOPY     LEFT HEART CATH AND CORONARY ANGIOGRAPHY N/A 06/23/2017   Procedure: LEFT HEART CATH AND CORONARY ANGIOGRAPHY;  Surgeon: Marykay Lex, MD;  Location: [facility] INVASIVE CV LAB;  Service: Cardiovascular;  Laterality: N/A;   SHOULDER ARTHROSCOPY WITH BICEPSTENOTOMY Right 09/06/2019  Procedure: SHOULDER ARTHROSCOPY WITH BICEPSTENOTOMY;  Surgeon: Tarry Kos, MD;  Location: [facility];  Service: Orthopedics;  Laterality: Right;   SHOULDER ARTHROSCOPY WITH SUBACROMIAL DECOMPRESSION Right 09/06/2019   Procedure: RIGHT SHOULDER ARTHROSCOPY WITH EXTENSIVE DEBRIDEMENT, SUBACROMIAL DECOMPRESSION, BICEPS TENOTOMY;  Surgeon: Tarry Kos, MD;  Location: [facility];  Service: Orthopedics;  Laterality: Right;   TENOTOMY ACHILLES TENDON     Patient Active Problem List   Diagnosis Date Noted   Hallux rigidus, right foot 09/15/2021   Spondylolisthesis at L5-S1 level 05/09/2021   Low back pain at multiple sites 02/05/2021   Osteoarthritis    Heart murmur    Exercise-induced asthma    Diabetes mellitus without complication (HCC)    Arthritis    Anemia    Myofascial pain syndrome, cervical 07/28/2019   Lateral epicondylitis of right elbow 07/28/2019   Nontraumatic incomplete tear of right rotator cuff 07/25/2019   Tendinopathy of right biceps tendon 07/25/2019   Erectile dysfunction 07/11/2019   Mild persistent asthma/cough variant asthma 04/13/2019   Allergic rhinitis with a possible nonallergic component 04/13/2019   Acid reflux 04/13/2019   Chronic cough 04/13/2019   Hoarseness 04/13/2019   Cervical radicular pain 01/10/2019   Ulnar neuropathy at elbow of right upper extremity 01/03/2019   Acute bilateral low back pain without sciatica 01/03/2019   Onychomycosis 05/25/2018   Palpitations 07/06/2017   CAD (coronary artery disease) 07/05/2017   Family history of early CAD 07/05/2017   Abnormal findings on diagnostic imaging of cardiovascular system 06/23/2017   Low vitamin D level 04/22/2017   Pain in right ankle and joints of right foot 10/05/2016   Hemarthrosis of right elbow 02/03/2016   Small thenar eminence 02/03/2016   Cervical disc disorder with radiculopathy of cervical region 10/07/2015   Incomplete rotator cuff tear 09/10/2015   Nonischemic cardiomyopathy (HCC) 05/15/2015   Fatigue 05/15/2015   Dyspnea on exertion 05/15/2015   Decreased cardiac ejection fraction 04/02/2015   Strain of latissimus dorsi muscle 01/21/2015   Shoulder bursitis 10/12/2014   Spinal stenosis, lumbar region, without neurogenic claudication 10/12/2014   BPPV (benign paroxysmal positional vertigo) 08/14/2014   Hyperlipidemia 01/19/2014    Anemia, unspecified 01/19/2014   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   Type 2 diabetes mellitus with hyperglycemia, without long-term current use of insulin (HCC) 12/22/2013   Hypogonadism in male 12/22/2013   Arthritis of hand, left 12/22/2013    PCP: Dr Arva Chafe, DO  REFERRING PROVIDER: Dr Rodolph Bong   REFERRING DIAG:  Spondylolisthesis at L5-S1 level  Chronic bilateral low back pain with right-sided sciatica    Rationale for Evaluation and Treatment: Rehabilitation  THERAPY DIAG:  Spondylolisthesis at L5-S1 level  Chronic bilateral low back pain with right-sided sciatica  Other symptoms and signs involving the musculoskeletal system  Muscle weakness (generalized)  ONSET DATE: 12/12/22  SUBJECTIVE:  SUBJECTIVE STATEMENT:  Patient reports he got MRI results and no change; states MD is recommending patient go to pain management for injection. Patient states he is planning on trying pickleball this Friday.   PERTINENT HISTORY:  Chronic LBP; bilat hamstring tightness; bilat knees; cervical disc surgery; AODM   PAIN:  Are you having pain? Yes: NPRS scale: 6/10 R LB; 8-9/10 R LB at night  Pain location: LB; R leg Pain description: dull aching in the LB;; sharp in the leg  Aggravating factors: back - sitting > 10 min; leg - turning foot/twisting  Relieving factors: meds   PRECAUTIONS: None    WEIGHT BEARING RESTRICTIONS: No  FALLS:  Has patient fallen in last 6 months? No  LIVING ENVIRONMENT: Lives with: lives with their spouse Lives in: House/apartment Stairs: Yes: Internal: 24 steps; on right going up and External: 3 steps; on right going up Has following equipment at home: None  OCCUPATION: retired [employer] officer, [employer]; active playing pickle ball     PATIENT GOALS: stop most of the pain; get back to activities   NEXT MD VISIT: 02/09/23  OBJECTIVE:   DIAGNOSTIC FINDINGS:  Xray 12/29/22 - results unavailable   PATIENT SURVEYS:  FOTO 31; goals 48    SENSATION: WFL  MUSCLE LENGTH: Hamstrings: Right 70 deg; Left 65 deg Tight hip flexors bilat   POSTURE: rounded shoulders, forward head, decreased lumbar lordosis, increased thoracic kyphosis, flexed trunk , and weight shift right  PALPATION: Pain with PA mobs lumbar spine; R lumbar paraspinals; posterior hip in gluts and piriformis; hamstrings   LUMBAR ROM:   AROM eval  Flexion 70% pain bilat LB  Extension 10% pain R mid back   Right lateral flexion 65% pull L > R LB  Left lateral flexion 60% pull R LB  Right rotation 20% pain mid back  Left rotation 20% discomfort mid back    (Blank rows = not tested)  LOWER EXTREMITY ROM:     Active Assistive Right eval Left eval  Hip flexion    Hip extension 0 0  Hip abduction    Hip adduction    Hip internal rotation    Hip external rotation    Knee flexion 90 95  Knee extension -5 -5  Ankle dorsiflexion 0 0  Ankle plantarflexion    Ankle inversion    Ankle eversion     (Blank rows = not tested)  LOWER EXTREMITY MMT:    MMT Right eval Left eval  Hip flexion 5- 5  Hip extension 4- 5-  Hip abduction 3+ 4+  Hip adduction    Hip internal rotation    Hip external rotation    Knee flexion 5 5  Knee extension 5 5  Ankle dorsiflexion    Ankle plantarflexion    Ankle inversion    Ankle eversion     (Blank rows = not tested)  LUMBAR SPECIAL TESTS:  Straight leg raise test: Negative and Slump test: Negative  FUNCTIONAL TESTS:  SLS: R 2 sec ; L 1 sec  5 times sit to stand: 23.5 sec  GAIT: Distance walked: 40  Assistive device utilized: None Level of assistance: Complete Independence Comments: flexed forward at trunk and hips    [facility] Adult PT Treatment:  DATE:  02/10/2023 Therapeutic Exercise: DKTC --> SKTC Figure 4 LTR  Dead bug with knee tap (single leg stretch) x6, x8 Supine:  Bridges + hip abd  GTB 2x10, Blue TB x10 HS stretch w/strap 3x30" (B) Seated dead lift + no weight --> 10#KB --> 15#KB Standing:  Squat --> focus on hip hinge mechanics & Wbing in heels Single leg dead lift with counter support   [facility] Adult PT Treatment:                                                DATE: 02/04/2023 Therapeutic Exercise: Doorway QL stretch (R)  Figure 4 LTR 10x5" (B) SKTC Dead bug with knee tap Isometric dead bug  (table top) holds 5x10" Prone: Hip IR AROM DL --> SL hip IR/ER Bent knee heel squeeze 5x5" Quadruped bent leg hip extension 2x5 (B)    [facility] Adult PT Treatment:                                                DATE: 02/02/2023 Therapeutic Exercise: Prone prop on forearms to pillow ~ 2-3 min  Quad stretch with strap 30 sec x 3 R/L  Cat cow 10 sec pause x 10  Supine HS stretch with strap 30 sec x 2 R/L cues to engage core  Manual Therapy: Passive R knee flexion/rotation (prone)  Skilled palpation to assess response to manual work and DN Trigger Point Dry-Needling  Treatment instructions: Expect mild to moderate muscle soreness. Patient verbalized understanding of these instructions and education. Patient Consent Given: Yes Education handout provided: Previously provided Muscles treated: R/L QL and lumbar paraspinals L4/5    Electrical stimulation performed: Yes Parameters: mAmp current intensity to patient tolerance  Treatment response/outcome: decreased palpable tightness    PATIENT EDUCATION:  Education details: Updated HEP Person educated: Patient Education method: Explanation, Demonstration, Tactile cues, Verbal cues, and Handouts Education comprehension: verbalized understanding, returned demonstration, verbal cues required, tactile cues required, and needs further education  HOME EXERCISE PROGRAM: Access Code:  [email or web address] URL: [email or web address] Date: 02/04/2023 Prepared by: Carlynn Herald  Exercises - Isometric Dead Bug  - 2 x daily - 7 x weekly - 1 sets - 10 reps - 5-10 sec hold - Dead Bug  - 1 x daily - 7 x weekly - 3 sets - 10 reps - Supine Single Knee to Chest Stretch  - 1 x daily - 7 x weekly - 1 sets - 10 reps - 10-20 sec hold - Supine Lower Trunk Rotation  - 1 x daily - 7 x weekly - 3 sets - 10 reps - Supine Hip External Rotation Stretch  - 1 x daily - 7 x weekly - 3 sets - 10 reps - Prone Hip Internal Rotation AROM  - 1 x daily - 7 x weekly - 3 sets - 10 reps - Quadruped Bent Leg Hip Extension  - 1 x daily - 7 x weekly - 3 sets - 10 reps  ASSESSMENT:  CLINICAL IMPRESSION: Lifting mechanics incorporated in today's session for proper form and postural alignment. Dowel provided tactile feedback during seated dead lift; patient demonstrated good carry-over with postural cues with progression to standing squats. Greater weakness noted in L single leg stance with  standing variations.  Eval: Patient is a 67 y.o. male who was seen today for physical therapy evaluation and treatment for  Spondylolisthesis at L5-S1 level and   Chronic bilateral low back pain with right-sided sciatica  .   OBJECTIVE IMPAIRMENTS: Abnormal gait, decreased activity tolerance, decreased endurance, decreased mobility, decreased ROM, hypomobility, increased fascial restrictions, increased muscle spasms, impaired flexibility, improper body mechanics, postural dysfunction, and pain.   GOALS: Goals reviewed with patient? Yes  SHORT TERM GOALS: Target date: 02/10/2023  Independent in initial HEP  Baseline: Goal status: MET  2.  Decrease LB and R LE pain by 25-50% allowing patient to return to normal activities  Baseline: R LE pain improved 80%, LBP pain no change Goal status: IN PROGRESS   LONG TERM GOALS: Target date: 03/24/2023  Increase hamstring flexibility to ~ 75 deg bilat  Baseline:  Goal  status: INITIAL  2.  Trunk ROM/mobility WFL's and minimal to no pain  Baseline:  Goal status: INITIAL  3.  Increase strength R LE to 4+/5 to 5/5  Baseline:  Goal status: INITIAL  4.  Patient reports return to regular activities including regular exercise - yoga and water exercises Baseline:  Goal status: INITIAL  5.  Independent in HEP including aquatic program as indicated  Baseline:  Goal status: INITIAL  6.  Improve functional limitation score to 48 Baseline:  Goal status: INITIAL  PLAN:  PT FREQUENCY: 2x/week  PT DURATION: 12 weeks  PLANNED INTERVENTIONS: Therapeutic exercises, Therapeutic activity, Neuromuscular re-education, Balance training, Gait training, Patient/Family education, Self Care, Joint mobilization, Aquatic Therapy, Dry Needling, Electrical stimulation, Spinal mobilization, Cryotherapy, Moist heat, Taping, Traction, Ultrasound, Ionotophoresis 4mg /ml Dexamethasone, Manual therapy, and Re-evaluation.  PLAN FOR NEXT SESSION: Progress exercises; continue efforts to educate patient re back care and body mechanics; manual work, DN, modalities as indicated.    Sanjuana Mae, PTA 02/10/2023, 2:06 PM

## 2023-02-11 ENCOUNTER — Telehealth: Payer: Self-pay

## 2023-02-11 ENCOUNTER — Encounter: Payer: Self-pay | Admitting: Family Medicine

## 2023-02-11 NOTE — Telephone Encounter (Signed)
Pre-operative Risk Assessment    Patient Name: Reginald Rios  DOB: [birth year] MRN: [id number]      Request for Surgical Clearance    Procedure:   lumbar epidural  Date of Surgery:  Clearance TBD                                 Surgeon:   Surgeon's Group or Practice Name:  [facility] Phone number:  [phone] Fax number:  [phone]   Type of Clearance Requested:   - Pharmacy:  Hold Clopidogrel (Plavix) 5 days    Type of Anesthesia:  Not Indicated   Additional requests/questions:    Merlene Laughter   02/11/2023, 4:35 PM

## 2023-02-12 NOTE — Telephone Encounter (Signed)
Name: Reginald Rios  DOB: [birth year]  MRN: [id number]   Primary Cardiologist: Norman Herrlich, MD  Chart reviewed as part of pre-operative protocol coverage. Patient was contacted 02/12/2023 in reference to pre-operative risk assessment for pending surgery as outlined below.  Reginald Rios was last seen on 12/31/2022 by Dr. Tomie China.  Since that day, Reginald Rios has done well from a cardiac standpoint. He denies any new symptoms or concerns.   Therefore, based on ACC/AHA guidelines, the patient would be at acceptable risk for the planned procedure without further cardiovascular testing.   The patient was advised that if he develops new symptoms prior to surgery to contact our office to arrange for a follow-up visit, and he verbalized understanding.  Per office protocol, he may hold Plavix for 5 days prior to procedure. Please resume Plavix as soon as possible postprocedure, at the discretion of the surgeon.    I will route this recommendation to the requesting party via Epic fax function and remove from pre-op pool. Please call with questions.  Joylene Grapes, NP 02/12/2023, 11:34 AM

## 2023-02-14 NOTE — Therapy (Signed)
OUTPATIENT PHYSICAL THERAPY THORACOLUMBAR TREATMENT   Patient Name: Reginald Rios MRN: [id number] DOB:[birth year], 67 y.o., male Today's Date: 02/15/2023  END OF SESSION:  PT End of Session - 02/15/23 1316     Visit Number 11    Number of Visits 24    Date for PT Re-Evaluation 03/24/23    Authorization Type [employer] $20 copay    PT Start Time 1316    PT Stop Time 1403    PT Time Calculation (min) 47 min    Activity Tolerance Patient tolerated treatment well    Behavior During Therapy [facility] for tasks assessed/performed             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   BPPV (benign paroxysmal positional vertigo) 08/14/2014   CAD (coronary artery disease)    2/19 PCI/DESx1 to Lcx   Cervical disc disorder with radiculopathy of cervical region 10/07/2015   Cervical radicular pain 01/10/2019   Chronic cough 04/13/2019   Decreased cardiac ejection fraction 04/02/2015   Diabetes mellitus without complication (HCC)    Dyspnea on exertion 05/15/2015   Erectile dysfunction 07/11/2019   Exercise-induced asthma    Family history of early CAD 07/05/2017   Fatigue 05/15/2015   Hallux rigidus, right foot 09/15/2021   Heart murmur    Hemarthrosis of right elbow 02/03/2016   Hoarseness 04/13/2019   Hyperlipidemia    family hx of high cholesterol   Hypogonadism in male    Incomplete rotator cuff tear 09/10/2015   Injected 09/10/2015   Lateral epicondylitis of right elbow 07/28/2019   Low back pain at multiple sites 02/05/2021   Low vitamin D level 04/22/2017   Mild  persistent asthma/cough variant asthma 04/13/2019   Myofascial pain syndrome, cervical 07/28/2019   Nonischemic cardiomyopathy (HCC) 05/15/2015   Nontraumatic incomplete tear of right rotator cuff 07/25/2019   Onychomycosis 05/25/2018   Osteoarthritis    Pain in right ankle and joints of right foot 10/05/2016   Palpitations 07/06/2017   Shoulder bursitis 10/12/2014   Injected in 10/12/2014  Repeat 01/21/2015   Small thenar eminence 02/03/2016   Spinal stenosis, lumbar region, without neurogenic claudication 10/12/2014   Spondylolisthesis at L5-S1 level 05/09/2021   Strain of latissimus dorsi muscle 01/21/2015   Tendinopathy of right biceps tendon 07/25/2019   Type 2 diabetes mellitus with hyperglycemia, without long-term current use of insulin (HCC) 12/22/2013   Ulnar neuropathy at elbow of right upper extremity 01/03/2019   Past Surgical History:  Procedure Laterality Date   CORONARY PRESSURE/FFR STUDY N/A 06/23/2017   Procedure: INTRAVASCULAR PRESSURE WIRE/FFR STUDY;  Surgeon: Marykay Lex, MD;  Location[facility] INVAS[facility] CV LAB;  Service: Cardiovascular;  Laterality: N/A;   CORONARY STENT INTERVENTION N/A 06/23/2017   Procedure: CORONARY STENT INTERVENTION;  Surgeon: Marykay Lex, MD;  Location: [facility] INVASIVE CV LAB;  Service: Cardiovascular;  Laterality: N/A;   KNEE ARTHROSCOPY     LEFT HEART CATH AND CORONARY ANGIOGRAPHY N/A 06/23/2017   Procedure: LEFT HEART CATH AND CORONARY ANGIOGRAPHY;  Surgeon: Marykay Lex, MD;  Location: [facility] INVASIVE CV LAB;  Service: Cardiovascular;  Laterality: N/A;   SHOULDER ARTHROSCOPY WITH BICEPSTENOTOMY Right 09/06/2019   Procedure: SHOULDER ARTHROSCOPY WITH BICEPSTENOTOMY;  Surgeon: Tarry Kos, MD;  Location: [facility];  Service: Orthopedics;  Laterality: Right;   SHOULDER ARTHROSCOPY WITH SUBACROMIAL DECOMPRESSION Right 09/06/2019   Procedure: RIGHT SHOULDER ARTHROSCOPY WITH EXTENSIVE DEBRIDEMENT, SUBACROMIAL DECOMPRESSION, BICEPS  TENOTOMY;  Surgeon: Tarry Kos, MD;  Location: [facility];  Service: Orthopedics;  Laterality: Right;   TENOTOMY ACHILLES TENDON     Patient Active Problem List   Diagnosis Date Noted   Hallux rigidus, right foot 09/15/2021   Spondylolisthesis at L5-S1 level 05/09/2021   Low back pain at multiple sites 02/05/2021   Osteoarthritis    Heart murmur    Exercise-induced asthma    Diabetes mellitus without complication (HCC)    Arthritis    Anemia    Myofascial pain syndrome, cervical 07/28/2019   Lateral epicondylitis of right elbow 07/28/2019   Nontraumatic incomplete tear of right rotator cuff 07/25/2019   Tendinopathy of right biceps tendon 07/25/2019   Erectile dysfunction 07/11/2019   Mild persistent asthma/cough variant asthma 04/13/2019   Allergic rhinitis with a possible nonallergic component 04/13/2019   Acid reflux 04/13/2019   Chronic cough 04/13/2019   Hoarseness 04/13/2019   Cervical radicular pain 01/10/2019   Ulnar neuropathy at elbow of right upper extremity 01/03/2019   Acute bilateral low back pain without sciatica 01/03/2019   Onychomycosis 05/25/2018   Palpitations 07/06/2017   CAD (coronary artery disease) 07/05/2017   Family history of early CAD 07/05/2017   Abnormal findings on diagnostic imaging of cardiovascular system 06/23/2017   Low vitamin D level 04/22/2017   Pain in right ankle and joints of right foot 10/05/2016   Hemarthrosis of right elbow 02/03/2016   Small thenar eminence 02/03/2016   Cervical disc disorder with radiculopathy of cervical region 10/07/2015   Incomplete rotator cuff tear 09/10/2015   Nonischemic cardiomyopathy (HCC) 05/15/2015   Fatigue 05/15/2015   Dyspnea on exertion 05/15/2015   Decreased cardiac ejection fraction 04/02/2015   Strain of latissimus dorsi muscle 01/21/2015   Shoulder bursitis 10/12/2014   Spinal stenosis, lumbar region, without neurogenic claudication 10/12/2014   BPPV (benign paroxysmal  positional vertigo) 08/14/2014   Hyperlipidemia 01/19/2014   Anemia, unspecified 01/19/2014   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   Type 2 diabetes mellitus with hyperglycemia, without long-term current use of insulin (HCC) 12/22/2013   Hypogonadism in male 12/22/2013   Arthritis of hand, left 12/22/2013    PCP: Dr Arva Chafe, DO  REFERRING PROVIDER: Dr Rodolph Bong   REFERRING DIAG:  Spondylolisthesis at L5-S1 level  Chronic bilateral low back pain with right-sided sciatica    Rationale for Evaluation and Treatment: Rehabilitation  THERAPY DIAG:  Spondylolisthesis at L5-S1 level  Chronic bilateral low back pain with right-sided sciatica  Other symptoms and signs involving the musculoskeletal system  Muscle weakness (generalized)  ONSET DATE: 12/12/22  SUBJECTIVE:  SUBJECTIVE STATEMENT:  Patient saw chiropractor on Thursday and has had increased pain to 8/10 from 2-4/10. Hasn't done anything since. Pain is constant. Twisting and bending, tying shoes worse.  Pain is also B to knees.    PERTINENT HISTORY:  Chronic LBP; bilat hamstring tightness; bilat knees; cervical disc surgery; AODM   PAIN:  Are you having pain? Yes: NPRS scale: 8/10 Pain location: LB; R leg Pain description: dull aching in the LB;; sharp in the leg  Aggravating factors: back - sitting > 10 min; leg - turning foot/twisting  Relieving factors: meds   PRECAUTIONS: None    WEIGHT BEARING RESTRICTIONS: No  FALLS:  Has patient fallen in last 6 months? No  LIVING ENVIRONMENT: Lives with: lives with their spouse Lives in: House/apartment Stairs: Yes: Internal: 24 steps; on right going up and External: 3 steps; on right going up Has following equipment at home: None  OCCUPATION:  retired Conservation officer, [employer]; active playing pickle ball    PATIENT GOALS: stop most of the pain; get back to activities   NEXT MD VISIT: 02/09/23  OBJECTIVE:   DIAGNOSTIC FINDINGS:  Xray 12/29/22 - results unavailable   PATIENT SURVEYS:  FOTO 31; goals 48    SENSATION: WFL  MUSCLE LENGTH: Hamstrings: Right 70 deg; Left 65 deg Tight hip flexors bilat   POSTURE: rounded shoulders, forward head, decreased lumbar lordosis, increased thoracic kyphosis, flexed trunk , and weight shift right  PALPATION: Pain with PA mobs lumbar spine; R lumbar paraspinals; posterior hip in gluts and piriformis; hamstrings   LUMBAR ROM:   AROM eval  Flexion 70% pain bilat LB  Extension 10% pain R mid back   Right lateral flexion 65% pull L > R LB  Left lateral flexion 60% pull R LB  Right rotation 20% pain mid back  Left rotation 20% discomfort mid back    (Blank rows = not tested)  LOWER EXTREMITY ROM:     Active Assistive Right eval Left eval  Hip flexion    Hip extension 0 0  Hip abduction    Hip adduction    Hip internal rotation    Hip external rotation    Knee flexion 90 95  Knee extension -5 -5  Ankle dorsiflexion 0 0  Ankle plantarflexion    Ankle inversion    Ankle eversion     (Blank rows = not tested)  LOWER EXTREMITY MMT:    MMT Right eval Left eval  Hip flexion 5- 5  Hip extension 4- 5-  Hip abduction 3+ 4+  Hip adduction    Hip internal rotation    Hip external rotation    Knee flexion 5 5  Knee extension 5 5  Ankle dorsiflexion    Ankle plantarflexion    Ankle inversion    Ankle eversion     (Blank rows = not tested)  LUMBAR SPECIAL TESTS:  Straight leg raise test: Negative and Slump test: Negative  FUNCTIONAL TESTS:  SLS: R 2 sec ; L 1 sec  5 times sit to stand: 23.5 sec  GAIT: Distance walked: 40  Assistive device utilized: None Level of assistance: Complete Independence Comments: flexed forward at trunk and hips    [facility] Adult PT Treatment:  DATE: 02/15/2023 Therapeutic Exercise: Standing lumbar ext 5 sec hold x 10 - decreased to 0/10 Prone on elbows - Low back 6/10 Prone lying x 1 min some tingling in legs, prone over one pillow Prone on elbows  Lateral side glide L x 5 (feels good); to R hurts Bridge x 10 Standing and Seated QL stretch Sidebending Manual:  TPR to B QL in S/L UPA mobs while prone on pillow - gd II/III B abolishes leg sx   [facility] Adult PT Treatment:                                                DATE: 02/10/2023 Therapeutic Exercise: DKTC --> SKTC Figure 4 LTR  Dead bug with knee tap (single leg stretch) x6, x8 Supine:  Bridges + hip abd  GTB 2x10, Blue TB x10 HS stretch w/strap 3x30" (B) Seated dead lift + no weight --> 10#KB --> 15#KB Standing:  Squat --> focus on hip hinge mechanics & Wbing in heels Single leg dead lift with counter support   [facility] Adult PT Treatment:                                                DATE: 02/04/2023 Therapeutic Exercise: Doorway QL stretch (R)  Figure 4 LTR 10x5" (B) SKTC Dead bug with knee tap Isometric dead bug  (table top) holds 5x10" Prone: Hip IR AROM DL --> SL hip IR/ER Bent knee heel squeeze 5x5" Quadruped bent leg hip extension 2x5 (B)    [facility] Adult PT Treatment:                                                DATE: 02/02/2023 Therapeutic Exercise: Prone prop on forearms to pillow ~ 2-3 min  Quad stretch with strap 30 sec x 3 R/L  Cat cow 10 sec pause x 10  Supine HS stretch with strap 30 sec x 2 R/L cues to engage core  Manual Therapy: Passive R knee flexion/rotation (prone)  Skilled palpation to assess response to manual work and DN Trigger Point Dry-Needling  Treatment instructions: Expect mild to moderate muscle soreness. Patient verbalized understanding of these instructions and education. Patient Consent Given: Yes Education handout provided: Previously provided Muscles treated: R/L QL and lumbar  paraspinals L4/5    Electrical stimulation performed: Yes Parameters: mAmp current intensity to patient tolerance  Treatment response/outcome: decreased palpable tightness    PATIENT EDUCATION:  Education details: Updated HEP Person educated: Patient Education method: Explanation, Demonstration, Tactile cues, Verbal cues, and Handouts Education comprehension: verbalized understanding, returned demonstration, verbal cues required, tactile cues required, and needs further education  HOME EXERCISE PROGRAM: Access Code: [email or web address] URL: [email or web address] Date: 02/15/2023 Prepared by: Raynelle Fanning  Exercises - Isometric Dead Bug  - 2 x daily - 7 x weekly - 1 sets - 10 reps - 5-10 sec hold - Dead Bug  - 1 x daily - 7 x weekly - 3 sets - 10 reps - Supine Single Knee to Chest Stretch  - 1 x daily - 7 x weekly - 1 sets - 10 reps -  10-20 sec hold - Supine Lower Trunk Rotation  - 1 x daily - 7 x weekly - 3 sets - 10 reps - Supine Hip External Rotation Stretch  - 1 x daily - 7 x weekly - 3 sets - 10 reps - Prone Hip Internal Rotation AROM  - 1 x daily - 7 x weekly - 3 sets - 10 reps - Quadruped Bent Leg Hip Extension  - 1 x daily - 7 x weekly - 3 sets - 10 reps - Squat with Chair Touch  - 1 x daily - 7 x weekly - 3 sets - 10 reps - Forward T with Counter Support  - 1 x daily - 7 x weekly - 3 sets - 10 reps - Standing Lumbar Extension at Wall - Forearms  - 4-5 x daily - 7 x weekly - 2 sets - 10 reps - Lying Prone with 1 Pillow  - 1 x daily - 3-4 x weekly - 1 sets - 10 reps - Supine Bridge  - 2 x daily - 7 x weekly - 1-3 sets - 10 reps - Prone Press Up On Elbows  - 4 x daily - 7 x weekly - 1 sets - 1 reps - 1-5 min hold - Standing Quadratus Lumborum Mobilization with Small Ball on Wall  - 1 x daily - 3-4 x weekly - 1 sets - 10 reps  ASSESSMENT:  CLINICAL IMPRESSION: Axcel presents today with marked increase in pain after seeing his chiropractor on Thursday and being adjusted. He saw  Dr. [name] prior to PT visit and was prescribed steroids and oxycodone. We focused on centralizing pain as it had moved into B LE. Leg pain was abolished with UPA mobs over pillow. He also feels relief with standing lumbar extension at the wall. Pain had decreased significantly at end of session. No pain in legs but back still 6-7/10.  Eval: Patient is a 67 y.o. male who was seen today for physical therapy evaluation and treatment for  Spondylolisthesis at L5-S1 level and   Chronic bilateral low back pain with right-sided sciatica  .   OBJECTIVE IMPAIRMENTS: Abnormal gait, decreased activity tolerance, decreased endurance, decreased mobility, decreased ROM, hypomobility, increased fascial restrictions, increased muscle spasms, impaired flexibility, improper body mechanics, postural dysfunction, and pain.   GOALS: Goals reviewed with patient? Yes  SHORT TERM GOALS: Target date: 02/10/2023  Independent in initial HEP  Baseline: Goal status: MET  2.  Decrease LB and R LE pain by 25-50% allowing patient to return to normal activities  Baseline: R LE pain improved 80%, LBP pain no change Goal status: IN PROGRESS   LONG TERM GOALS: Target date: 03/24/2023  Increase hamstring flexibility to ~ 75 deg bilat  Baseline:  Goal status: INITIAL  2.  Trunk ROM/mobility WFL's and minimal to no pain  Baseline:  Goal status: INITIAL  3.  Increase strength R LE to 4+/5 to 5/5  Baseline:  Goal status: INITIAL  4.  Patient reports return to regular activities including regular exercise - yoga and water exercises Baseline:  Goal status: INITIAL  5.  Independent in HEP including aquatic program as indicated  Baseline:  Goal status: INITIAL  6.  Improve functional limitation score to 48 Baseline:  Goal status: INITIAL  PLAN:  PT FREQUENCY: 2x/week  PT DURATION: 12 weeks  PLANNED INTERVENTIONS: Therapeutic exercises, Therapeutic activity, Neuromuscular re-education, Balance training, Gait  training, Patient/Family education, Self Care, Joint mobilization, Aquatic Therapy, Dry Needling, Electrical stimulation, Spinal mobilization, Cryotherapy,  Moist heat, Taping, Traction, Ultrasound, Ionotophoresis 4mg /ml Dexamethasone, Manual therapy, and Re-evaluation.  PLAN FOR NEXT SESSION: Progress exercises; continue efforts to educate patient re back care and body mechanics; manual work, DN, modalities as indicated.    Solon Palm, PT  02/15/2023, 4:54 PM

## 2023-02-15 ENCOUNTER — Ambulatory Visit: Payer: Commercial Managed Care - PPO | Admitting: Family Medicine

## 2023-02-15 ENCOUNTER — Encounter: Payer: Self-pay | Admitting: Physical Therapy

## 2023-02-15 ENCOUNTER — Other Ambulatory Visit (HOSPITAL_BASED_OUTPATIENT_CLINIC_OR_DEPARTMENT_OTHER): Payer: Self-pay

## 2023-02-15 ENCOUNTER — Ambulatory Visit (INDEPENDENT_AMBULATORY_CARE_PROVIDER_SITE_OTHER): Payer: Commercial Managed Care - PPO

## 2023-02-15 ENCOUNTER — Ambulatory Visit: Payer: Commercial Managed Care - PPO | Admitting: Physical Therapy

## 2023-02-15 VITALS — BP 118/72 | HR 75 | Ht 70.0 in | Wt 205.0 lb

## 2023-02-15 DIAGNOSIS — M6281 Muscle weakness (generalized): Secondary | ICD-10-CM

## 2023-02-15 DIAGNOSIS — G8929 Other chronic pain: Secondary | ICD-10-CM

## 2023-02-15 DIAGNOSIS — M5442 Lumbago with sciatica, left side: Secondary | ICD-10-CM

## 2023-02-15 DIAGNOSIS — M5441 Lumbago with sciatica, right side: Secondary | ICD-10-CM | POA: Diagnosis not present

## 2023-02-15 DIAGNOSIS — R29898 Other symptoms and signs involving the musculoskeletal system: Secondary | ICD-10-CM | POA: Diagnosis not present

## 2023-02-15 DIAGNOSIS — M4317 Spondylolisthesis, lumbosacral region: Secondary | ICD-10-CM

## 2023-02-15 DIAGNOSIS — M545 Low back pain, unspecified: Secondary | ICD-10-CM | POA: Diagnosis not present

## 2023-02-15 DIAGNOSIS — I700 Atherosclerosis of aorta: Secondary | ICD-10-CM | POA: Diagnosis not present

## 2023-02-15 DIAGNOSIS — M48061 Spinal stenosis, lumbar region without neurogenic claudication: Secondary | ICD-10-CM | POA: Diagnosis not present

## 2023-02-15 DIAGNOSIS — M47816 Spondylosis without myelopathy or radiculopathy, lumbar region: Secondary | ICD-10-CM | POA: Diagnosis not present

## 2023-02-15 MED ORDER — OXYCODONE-ACETAMINOPHEN 5-325 MG PO TABS
1.0000 | ORAL_TABLET | ORAL | 0 refills | Status: DC | PRN
  Filled 2023-02-15: qty 15, 3d supply, fill #0

## 2023-02-15 MED ORDER — PREDNISONE 50 MG PO TABS
50.0000 mg | ORAL_TABLET | Freq: Every day | ORAL | 0 refills | Status: DC
  Filled 2023-02-15: qty 5, 5d supply, fill #0

## 2023-02-15 NOTE — Progress Notes (Signed)
Rubin Payor, PhD, LAT, ATC acting as a scribe for [name], MD.  Reginald Rios is a 67 y.o. male who presents to [facility] today for exacerbation of his LBP. Pt was last seen by Dr. [name] 02/09/23 and was advised to cont PT and a lumbar ESI was ordered.   Today, pt reports ESI is OK'ed by cardiology, but not scheduled by [facility] yet. He had a chiro adjustment on Thursday. Pain wasn't immediate, but began after the visit. Pt locates pain to L>R-side of his low back w/ numbness/tingling into both legs, anterior thigh and lateral aspect of his lower legs. Difficulty ambulating, trunk flexion. He's been taking some left over oxycodone.  He denies any bowel or bladder dysfunction.  He has a PT visit scheduled for later today.  Dx imaging: 01/24/23 L-spine MRI 12/29/22 L-spine XR 09/14/22 Pelvis MRI             08/25/22 Bilat hips/pelvis XR             08/20/22 L-spine XR             05/03/21 L-spine MRI             02/19/21 L-spine XR  Pertinent review of systems: No fevers or chills  Relevant historical information: Diabetes   Exam:  BP 118/72   Pulse 75   Ht 5\' 10"  (1.778 m)   Wt 205 lb (93 kg)   SpO2 97%   BMI 29.41 kg/m  General: Well Developed, well nourished, and in no acute distress.   MSK: Lumbar spine: Normal appearing Nontender palpation spinal midline. Decreased lumbar motion. Lower extremity strength decreased hip flexion bilaterally left 4/5 right 4+/5 Right foot dorsiflexion strength is mildly diminished 4+/5 Otherwise lower extremity strength is intact and equal bilaterally.  Lab and Radiology Results  X-ray images lumbar spine obtained today personally and independently interpreted Multilevel DDD.  No acute fractures are visible. Await formal radiology review  EXAM: MRI LUMBAR SPINE WITHOUT CONTRAST   TECHNIQUE: Multiplanar, multisequence MR imaging of the lumbar spine was performed. No intravenous contrast was  administered.   COMPARISON:  Lumbar spine radiograph 12/29/2022, lumbar spine MRI 05/03/2021   FINDINGS: Segmentation: In keeping with prior numbering convention the last well-formed disc space is labeled L5-S1. There is rudimentary disc space at S1-S2.   Alignment: Trace retrolisthesis of L2 on L3. Grade 1 anterolisthesis of L5 on S1.   Vertebrae:  No fracture, evidence of discitis, or bone lesion.   Conus medullaris and cauda equina: Conus extends to the L1 level. Conus and cauda equina appear normal. The degree of epidural lipomatosis, predominantly at the L4-L5 and L5-S1 level is unchanged from prior exam.   Paraspinal and other soft tissues: Negative.   Disc levels:   T12-L1: Mild bilateral facet degenerative change. No spinal canal or neural foraminal narrowing.   L1-L2: Mild bilateral facet degenerative change. No spinal canal narrowing. Significant disc bulge. Mild bilateral neural foraminal narrowing. Findings are unchanged compared to prior exam from 2022.   L2-L3: Eccentric left disc bulge. Mild bilateral facet degenerative change with fluid in the facet joints. Moderate to severe spinal canal narrowing. Moderate bilateral neural foraminal narrowing, right-greater-than-left. Findings are unchanged from prior exam.   L3-L4: Moderate bilateral facet degenerative change. Circumferential disc bulge. Moderate spinal canal narrowing. Moderate bilateral neural foraminal narrowing. Findings are unchanged from prior exam.   L4-L5: Severe bilateral facet degenerative change. Circumferential disc bulge. Severe  spinal canal narrowing. Severe left and moderate right neural foraminal narrowing. Findings are unchanged from prior exam.   L5-S1: Severe bilateral facet degenerative change. Ligamentum flavum hypertrophy. Minimal disc bulge. Moderate to severe spinal canal narrowing. Severe right and moderate left neural foraminal narrowing. Findings are unchanged prior exam.    IMPRESSION: 1. No acute fracture or traumatic listhesis. 2. Unchanged multilevel degenerative changes of the lumbar spine with severe spinal canal narrowing at L4-L5 and moderate to severe spinal canal narrowing at L2-L3 and L5-S1. 3. Unchanged multilevel neural foraminal narrowing, severe on the left at L4-L5 and on the right at L5-S1.     Electronically Signed   By: Lorenza Cambridge M.D.   On: 02/09/2023 08:38    Assessment and Plan: 66 y.o. male with acute exacerbation of chronic low back pain with pain radiating to the lower legs.  He has developed new weakness and pain specifically at the L2 or L3 pattern left worse than right.  He has new weakness to hip flexion.  At the last visit last week I did order an epidural steroid injection which she has yet to schedule.  That would be helpful for this flareup now.  Will also prescribe prednisone.  I think with his new weakness is worthwhile following up with neurosurgery for surgical consultation and second opinion.  Neurosurgery referral ordered today. For now prednisone epidural steroid injection and neurosurgery.   PDMP reviewed during this encounter. Orders Placed This Encounter  Procedures   DG Lumbar Spine 2-3 Views    Standing Status:   Future    Number of Occurrences:   1    Standing Expiration Date:   02/15/2024    Order Specific Question:   Reason for Exam (SYMPTOM  OR DIAGNOSIS REQUIRED)    Answer:   eval worse low back pain    Order Specific Question:   Preferred imaging location?    Answer:   [facility]   Ambulatory referral to Neurosurgery    Referral Priority:   Routine    Referral Type:   Surgical    Referral Reason:   Specialty Services Required    Requested Specialty:   Neurosurgery    Number of Visits Requested:   1   Meds ordered this encounter  Medications   predniSONE (DELTASONE) 50 MG tablet    Sig: Take 1 tablet (50 mg total) by mouth daily.    Dispense:  5 tablet    Refill:  0    oxyCODONE-acetaminophen (PERCOCET/ROXICET) 5-325 MG tablet    Sig: Take 1 tablet by mouth every 4 (four) hours as needed for severe pain.    Dispense:  15 tablet    Refill:  0     Discussed warning signs or symptoms. Please see discharge instructions. Patient expresses understanding.   The above documentation has been reviewed and is accurate and complete [name], M.D.

## 2023-02-15 NOTE — Patient Instructions (Addendum)
Thank you for coming in today.   Please get an Xray today before you leave   I've referred you to Neurosurgery.  Let us know if you don't hear from them in one week.   Medications have been sent to your pharmacy  Let me know how it goes.

## 2023-02-17 NOTE — Therapy (Signed)
OUTPATIENT PHYSICAL THERAPY THORACOLUMBAR TREATMENT   Patient Name: Reginald Rios MRN: [id number] DOB:08-22-1955, 67 y.o., male Today's Date: 02/18/2023  END OF SESSION:  PT End of Session - 02/18/23 1149     Visit Number 12    Number of Visits 24    Date for PT Re-Evaluation 03/24/23    Authorization Type [employer] $20 copay    PT Start Time 1149    PT Stop Time 1227    PT Time Calculation (min) 38 min    Activity Tolerance Patient tolerated treatment well    Behavior During Therapy [facility] for tasks assessed/performed              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   BPPV (benign paroxysmal positional vertigo) 08/14/2014   CAD (coronary artery disease)    2/19 PCI/DESx1 to Lcx   Cervical disc disorder with radiculopathy of cervical region 10/07/2015   Cervical radicular pain 01/10/2019   Chronic cough 04/13/2019   Decreased cardiac ejection fraction 04/02/2015   Diabetes mellitus without complication (HCC)    Dyspnea on exertion 05/15/2015   Erectile dysfunction 07/11/2019   Exercise-induced asthma    Family history of early CAD 07/05/2017   Fatigue 05/15/2015   Hallux rigidus, right foot 09/15/2021   Heart murmur    Hemarthrosis of right elbow 02/03/2016   Hoarseness 04/13/2019   Hyperlipidemia    family hx of high cholesterol   Hypogonadism in male    Incomplete rotator cuff tear 09/10/2015   Injected 09/10/2015   Lateral epicondylitis of right elbow 07/28/2019   Low back pain at multiple sites 02/05/2021   Low vitamin D level 04/22/2017   Mild  persistent asthma/cough variant asthma 04/13/2019   Myofascial pain syndrome, cervical 07/28/2019   Nonischemic cardiomyopathy (HCC) 05/15/2015   Nontraumatic incomplete tear of right rotator cuff 07/25/2019   Onychomycosis 05/25/2018   Osteoarthritis    Pain in right ankle and joints of right foot 10/05/2016   Palpitations 07/06/2017   Shoulder bursitis 10/12/2014   Injected in 10/12/2014  Repeat 01/21/2015   Small thenar eminence 02/03/2016   Spinal stenosis, lumbar region, without neurogenic claudication 10/12/2014   Spondylolisthesis at L5-S1 level 05/09/2021   Strain of latissimus dorsi muscle 01/21/2015   Tendinopathy of right biceps tendon 07/25/2019   Type 2 diabetes mellitus with hyperglycemia, without long-term current use of insulin (HCC) 12/22/2013   Ulnar neuropathy at elbow of right upper extremity 01/03/2019   Past Surgical History:  Procedure Laterality Date   CORONARY PRESSURE/FFR STUDY N/A 06/23/2017   Procedure: INTRAVASCULAR PRESSURE WIRE/FFR STUDY;  Surgeon: Marykay Lex, MD;  Location: [facility] INVASIVE CV LAB;  Service: Cardiovascular;  Laterality: N/A;   CORONARY STENT INTERVENTION N/A 06/23/2017   Procedure: CORONARY STENT INTERVENTION;  Surgeon: Marykay Lex, MD;  Location: [facility] INVASIVE CV LAB;  Service: Cardiovascular;  Laterality: N/A;   KNEE ARTHROSCOPY     LEFT HEART CATH AND CORONARY ANGIOGRAPHY N/A 06/23/2017   Procedure: LEFT HEART CATH AND CORONARY ANGIOGRAPHY;  Surgeon: Marykay Lex, MD;  Location: [facility] INVASIVE CV LAB;  Service: Cardiovascular;  Laterality: N/A;   SHOULDER ARTHROSCOPY WITH BICEPSTENOTOMY Right 09/06/2019   Procedure: SHOULDER ARTHROSCOPY WITH BICEPSTENOTOMY;  Surgeon: Tarry Kos, MD;  Location: [facility];  Service: Orthopedics;  Laterality: Right;   SHOULDER ARTHROSCOPY WITH SUBACROMIAL DECOMPRESSION Right 09/06/2019   Procedure: RIGHT SHOULDER ARTHROSCOPY WITH EXTENSIVE DEBRIDEMENT, SUBACROMIAL DECOMPRESSION, BICEPS  TENOTOMY;  Surgeon: Tarry Kos, MD;  Location:  SURGERY CENTER;  Service: Orthopedics;  Laterality: Right;   TENOTOMY ACHILLES TENDON     Patient Active Problem List   Diagnosis Date Noted   Hallux rigidus, right foot 09/15/2021   Spondylolisthesis at L5-S1 level 05/09/2021   Low back pain at multiple sites 02/05/2021   Osteoarthritis    Heart murmur    Exercise-induced asthma    Diabetes mellitus without complication (HCC)    Arthritis    Anemia    Myofascial pain syndrome, cervical 07/28/2019   Lateral epicondylitis of right elbow 07/28/2019   Nontraumatic incomplete tear of right rotator cuff 07/25/2019   Tendinopathy of right biceps tendon 07/25/2019   Erectile dysfunction 07/11/2019   Mild persistent asthma/cough variant asthma 04/13/2019   Allergic rhinitis with a possible nonallergic component 04/13/2019   Acid reflux 04/13/2019   Chronic cough 04/13/2019   Hoarseness 04/13/2019   Cervical radicular pain 01/10/2019   Ulnar neuropathy at elbow of right upper extremity 01/03/2019   Acute bilateral low back pain without sciatica 01/03/2019   Onychomycosis 05/25/2018   Palpitations 07/06/2017   CAD (coronary artery disease) 07/05/2017   Family history of early CAD 07/05/2017   Abnormal findings on diagnostic imaging of cardiovascular system 06/23/2017   Low vitamin D level 04/22/2017   Pain in right ankle and joints of right foot 10/05/2016   Hemarthrosis of right elbow 02/03/2016   Small thenar eminence 02/03/2016   Cervical disc disorder with radiculopathy of cervical region 10/07/2015   Incomplete rotator cuff tear 09/10/2015   Nonischemic cardiomyopathy (HCC) 05/15/2015   Fatigue 05/15/2015   Dyspnea on exertion 05/15/2015   Decreased cardiac ejection fraction 04/02/2015   Strain of latissimus dorsi muscle 01/21/2015   Shoulder bursitis 10/12/2014   Spinal stenosis, lumbar region, without neurogenic claudication 10/12/2014   BPPV (benign paroxysmal  positional vertigo) 08/14/2014   Hyperlipidemia 01/19/2014   Anemia, unspecified 01/19/2014   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   Type 2 diabetes mellitus with hyperglycemia, without long-term current use of insulin (HCC) 12/22/2013   Hypogonadism in male 12/22/2013   Arthritis of hand, left 12/22/2013    PCP: Dr Arva Chafe, DO  REFERRING PROVIDER: Dr Rodolph Bong   REFERRING DIAG:  Spondylolisthesis at L5-S1 level  Chronic bilateral low back pain with right-sided sciatica    Rationale for Evaluation and Treatment: Rehabilitation  THERAPY DIAG:  Spondylolisthesis at L5-S1 level  Chronic bilateral low back pain with right-sided sciatica  Other symptoms and signs involving the musculoskeletal system  Muscle weakness (generalized)  ONSET DATE: 12/12/22  SUBJECTIVE:  SUBJECTIVE STATEMENT:  Mowed the grass. Just hips hurt today 2/10   PERTINENT HISTORY:  Chronic LBP; bilat hamstring tightness; bilat knees; cervical disc surgery; AODM   PAIN:  Are you having pain? Yes: NPRS scale: 2/10 Pain location: LB; R leg Pain description: dull aching in the LB;; sharp in the leg  Aggravating factors: back - sitting > 10 min; leg - turning foot/twisting  Relieving factors: meds   PRECAUTIONS: None    WEIGHT BEARING RESTRICTIONS: No  FALLS:  Has patient fallen in last 6 months? No  LIVING ENVIRONMENT: Lives with: lives with their spouse Lives in: House/apartment Stairs: Yes: Internal: 24 steps; on right going up and External: 3 steps; on right going up Has following equipment at home: None  OCCUPATION: retired [employer] officer, [employer]; active playing pickle ball    PATIENT GOALS: stop most of the pain; get back to activities   NEXT MD VISIT: 02/09/23  OBJECTIVE:    DIAGNOSTIC FINDINGS:  Xray 12/29/22 - results unavailable   PATIENT SURVEYS:  FOTO 31; goals 48    SENSATION: WFL  MUSCLE LENGTH: Hamstrings: Right 70 deg; Left 65 deg Tight hip flexors bilat   POSTURE: rounded shoulders, forward head, decreased lumbar lordosis, increased thoracic kyphosis, flexed trunk , and weight shift right  PALPATION: Pain with PA mobs lumbar spine; R lumbar paraspinals; posterior hip in gluts and piriformis; hamstrings   LUMBAR ROM:   AROM eval  Flexion 70% pain bilat LB  Extension 10% pain R mid back   Right lateral flexion 65% pull L > R LB  Left lateral flexion 60% pull R LB  Right rotation 20% pain mid back  Left rotation 20% discomfort mid back    (Blank rows = not tested)  LOWER EXTREMITY ROM:     Active Assistive Right eval Left eval  Hip flexion    Hip extension 0 0  Hip abduction    Hip adduction    Hip internal rotation    Hip external rotation    Knee flexion 90 95  Knee extension -5 -5  Ankle dorsiflexion 0 0  Ankle plantarflexion    Ankle inversion    Ankle eversion     (Blank rows = not tested)  LOWER EXTREMITY MMT:    MMT Right eval Left eval  Hip flexion 5- 5  Hip extension 4- 5-  Hip abduction 3+ 4+  Hip adduction    Hip internal rotation    Hip external rotation    Knee flexion 5 5  Knee extension 5 5  Ankle dorsiflexion    Ankle plantarflexion    Ankle inversion    Ankle eversion     (Blank rows = not tested)  LUMBAR SPECIAL TESTS:  Straight leg raise test: Negative and Slump test: Negative  FUNCTIONAL TESTS:  SLS: R 2 sec ; L 1 sec  5 times sit to stand: 23.5 sec  GAIT: Distance walked: 40  Assistive device utilized: None Level of assistance: Complete Independence Comments: flexed forward at trunk and hips    [facility] Adult PT Treatment:                                                DATE: 02/18/2023 Therapeutic Exercise: Manual:  P/A mobs to L5/S1 and L4/5 gd III abolishes R pain,  centralizes L to 4/10 pain Prone press ups x  10- abolishes L side pain  - 2nd set brings it back a little on L to 1-2/10 Prone on elbows - with thoracic protraction/retraction x 5 due to c/o upper back pain Prone press with alt hip ext 2 x 10 ea Bridge 2 x 10 Supine piriformis stretch B (to avoid twisting or bending spine) Sit to stand Mat + foam hands on knees x 10 Split squat x 10 B  [facility] Adult PT Treatment:                                                DATE: 02/15/2023 Therapeutic Exercise: Standing lumbar ext 5 sec hold x 10 - decreased to 0/10 Prone on elbows - Low back 6/10 Prone lying x 1 min some tingling in legs, prone over one pillow Prone on elbows  Lateral side glide L x 5 (feels good); to R hurts Bridge x 10 Standing and Seated QL stretch Sidebending Manual:  TPR to B QL in S/L UPA mobs while prone on pillow - gd II/III B abolishes leg sx   [facility] Adult PT Treatment:                                                DATE: 02/10/2023 Therapeutic Exercise: DKTC --> SKTC Figure 4 LTR  Dead bug with knee tap (single leg stretch) x6, x8 Supine:  Bridges + hip abd  GTB 2x10, Blue TB x10 HS stretch w/strap 3x30" (B) Seated dead lift + no weight --> 10#KB --> 15#KB Standing:  Squat --> focus on hip hinge mechanics & Wbing in heels Single leg dead lift with counter support PATIENT EDUCATION:  Education details: Updated HEP Person educated: Patient Education method: Explanation, Demonstration, Tactile cues, Verbal cues, and Handouts Education comprehension: verbalized understanding, returned demonstration, verbal cues required, tactile cues required, and needs further education  HOME EXERCISE PROGRAM: Access Code: [email or web address] URL: [email or web address] Date: 02/18/2023 Prepared by: Raynelle Fanning  Exercises - Isometric Dead Bug  - 2 x daily - 7 x weekly - 1 sets - 10 reps - 5-10 sec hold - Dead Bug  - 1 x daily - 7 x weekly - 3 sets - 10 reps - Supine Single Knee to  Chest Stretch  - 1 x daily - 7 x weekly - 1 sets - 10 reps - 10-20 sec hold - Supine Lower Trunk Rotation  - 1 x daily - 7 x weekly - 3 sets - 10 reps - Supine Hip External Rotation Stretch  - 1 x daily - 7 x weekly - 3 sets - 10 reps - Prone Hip Internal Rotation AROM  - 1 x daily - 7 x weekly - 3 sets - 10 reps - Quadruped Bent Leg Hip Extension  - 1 x daily - 7 x weekly - 3 sets - 10 reps - Squat with Chair Touch  - 1 x daily - 7 x weekly - 3 sets - 10 reps - Forward T with Counter Support  - 1 x daily - 7 x weekly - 3 sets - 10 reps - Standing Lumbar Extension at Wall - Forearms  - 4-5 x daily - 7 x weekly - 2 sets - 10 reps - Supine Bridge  - 2  x daily - 7 x weekly - 1-3 sets - 10 reps - Prone Press Up On Elbows  - 4 x daily - 7 x weekly - 1 sets - 1 reps - 1-5 min hold - Standing Quadratus Lumborum Mobilization with Small Ball on Wall  - 1 x daily - 3-4 x weekly - 1 sets - 10 reps - Prone Press Up  - 3-4 x daily - 7 x weekly - 2 sets - 10 reps - Split Squats Upright Trunk (Quad Bias)  - 1 x daily - 3-4 x weekly - 1-2 sets - 10 reps  ASSESSMENT:  CLINICAL IMPRESSION: Diarra presents with significant decrease in pain since flare up last Thursday. He had pain into B hips that wrapped around to groin today and was able to abolish pain with prone press ups. We then focused on prone lumbar stab and LE strength to help improve body mechanics. He is very weak with sit to stand and lunges.   Eval: Patient is a 67 y.o. male who was seen today for physical therapy evaluation and treatment for  Spondylolisthesis at L5-S1 level and   Chronic bilateral low back pain with right-sided sciatica  .   OBJECTIVE IMPAIRMENTS: Abnormal gait, decreased activity tolerance, decreased endurance, decreased mobility, decreased ROM, hypomobility, increased fascial restrictions, increased muscle spasms, impaired flexibility, improper body mechanics, postural dysfunction, and pain.   GOALS: Goals reviewed with  patient? Yes  SHORT TERM GOALS: Target date: 02/10/2023  Independent in initial HEP  Baseline: Goal status: MET  2.  Decrease LB and R LE pain by 25-50% allowing patient to return to normal activities  Baseline: R LE pain improved 80%, LBP pain no change Goal status: IN PROGRESS   LONG TERM GOALS: Target date: 03/24/2023  Increase hamstring flexibility to ~ 75 deg bilat  Baseline:  Goal status: INITIAL  2.  Trunk ROM/mobility WFL's and minimal to no pain  Baseline:  Goal status: INITIAL  3.  Increase strength R LE to 4+/5 to 5/5  Baseline:  Goal status: INITIAL  4.  Patient reports return to regular activities including regular exercise - yoga and water exercises Baseline:  Goal status: INITIAL  5.  Independent in HEP including aquatic program as indicated  Baseline:  Goal status: INITIAL  6.  Improve functional limitation score to 48 Baseline:  Goal status: INITIAL  PLAN:  PT FREQUENCY: 2x/week  PT DURATION: 12 weeks  PLANNED INTERVENTIONS: Therapeutic exercises, Therapeutic activity, Neuromuscular re-education, Balance training, Gait training, Patient/Family education, Self Care, Joint mobilization, Aquatic Therapy, Dry Needling, Electrical stimulation, Spinal mobilization, Cryotherapy, Moist heat, Taping, Traction, Ultrasound, Ionotophoresis 4mg /ml Dexamethasone, Manual therapy, and Re-evaluation.  PLAN FOR NEXT SESSION: Progress exercises; work on functional LE strengthening, leg press, lunges; extension biased activities. continue efforts to educate patient re back care and body mechanics; manual work, DN, modalities as indicated.    Solon Palm, PT  02/18/2023, 12:31 PM

## 2023-02-18 ENCOUNTER — Ambulatory Visit: Payer: Commercial Managed Care - PPO | Admitting: Physical Therapy

## 2023-02-18 ENCOUNTER — Encounter: Payer: Self-pay | Admitting: Physical Therapy

## 2023-02-18 DIAGNOSIS — R29898 Other symptoms and signs involving the musculoskeletal system: Secondary | ICD-10-CM | POA: Diagnosis not present

## 2023-02-18 DIAGNOSIS — G8929 Other chronic pain: Secondary | ICD-10-CM

## 2023-02-18 DIAGNOSIS — M4317 Spondylolisthesis, lumbosacral region: Secondary | ICD-10-CM

## 2023-02-18 DIAGNOSIS — M6281 Muscle weakness (generalized): Secondary | ICD-10-CM | POA: Diagnosis not present

## 2023-02-18 DIAGNOSIS — M5441 Lumbago with sciatica, right side: Secondary | ICD-10-CM | POA: Diagnosis not present

## 2023-02-22 ENCOUNTER — Ambulatory Visit: Payer: Commercial Managed Care - PPO | Admitting: Rehabilitative and Restorative Service Providers"

## 2023-02-22 ENCOUNTER — Encounter: Payer: Self-pay | Admitting: Rehabilitative and Restorative Service Providers"

## 2023-02-22 DIAGNOSIS — M5441 Lumbago with sciatica, right side: Secondary | ICD-10-CM | POA: Diagnosis not present

## 2023-02-22 DIAGNOSIS — M4317 Spondylolisthesis, lumbosacral region: Secondary | ICD-10-CM | POA: Diagnosis not present

## 2023-02-22 DIAGNOSIS — R29898 Other symptoms and signs involving the musculoskeletal system: Secondary | ICD-10-CM | POA: Diagnosis not present

## 2023-02-22 DIAGNOSIS — M6281 Muscle weakness (generalized): Secondary | ICD-10-CM | POA: Diagnosis not present

## 2023-02-22 DIAGNOSIS — G8929 Other chronic pain: Secondary | ICD-10-CM

## 2023-02-22 NOTE — Therapy (Signed)
OUTPATIENT PHYSICAL THERAPY THORACOLUMBAR TREATMENT   Patient Name: Reginald Rios MRN: [id number] DOB:[birth year], 67 y.o., male Today's Date: 02/22/2023  END OF SESSION:  PT End of Session - 02/22/23 1316     Visit Number 13    Number of Visits 24    Date for PT Re-Evaluation 03/24/23    Authorization Type [employer] $20 copay    PT Start Time 1315    PT Stop Time 1401    PT Time Calculation (min) 46 min    Activity Tolerance Patient tolerated treatment well              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   BPPV (benign paroxysmal positional vertigo) 08/14/2014   CAD (coronary artery disease)    2/19 PCI/DESx1 to Lcx   Cervical disc disorder with radiculopathy of cervical region 10/07/2015   Cervical radicular pain 01/10/2019   Chronic cough 04/13/2019   Decreased cardiac ejection fraction 04/02/2015   Diabetes mellitus without complication (HCC)    Dyspnea on exertion 05/15/2015   Erectile dysfunction 07/11/2019   Exercise-induced asthma    Family history of early CAD 07/05/2017   Fatigue 05/15/2015   Hallux rigidus, right foot 09/15/2021   Heart murmur    Hemarthrosis of right elbow 02/03/2016   Hoarseness 04/13/2019   Hyperlipidemia    family hx of high cholesterol   Hypogonadism in male    Incomplete rotator cuff tear 09/10/2015   Injected 09/10/2015   Lateral epicondylitis of right elbow 07/28/2019   Low back pain at multiple sites 02/05/2021   Low vitamin D level 04/22/2017   Mild persistent asthma/cough variant asthma 04/13/2019   Myofascial  pain syndrome, cervical 07/28/2019   Nonischemic cardiomyopathy (HCC) 05/15/2015   Nontraumatic incomplete tear of right rotator cuff 07/25/2019   Onychomycosis 05/25/2018   Osteoarthritis    Pain in right ankle and joints of right foot 10/05/2016   Palpitations 07/06/2017   Shoulder bursitis 10/12/2014   Injected in 10/12/2014  Repeat 01/21/2015   Small thenar eminence 02/03/2016   Spinal stenosis, lumbar region, without neurogenic claudication 10/12/2014   Spondylolisthesis at L5-S1 level 05/09/2021   Strain of latissimus dorsi muscle 01/21/2015   Tendinopathy of right biceps tendon 07/25/2019   Type 2 diabetes mellitus with hyperglycemia, without long-term current use of insulin (HCC) 12/22/2013   Ulnar neuropathy at elbow of right upper extremity 01/03/2019   Past Surgical History:  Procedure Laterality Date   CORONARY PRESSURE/FFR STUDY N/A 06/23/2017   Procedure: INTRAVASCULAR PRESSURE WIRE/FFR STUDY;  Surgeon: Marykay Lex, MD;  Location: [facility] INVASIVE CV LAB;  Service: Cardiovascular;  Laterality: N/A;   CORONARY STENT INTERVENTION N/A 06/23/2017   Procedure: CORONARY STENT INTERVENTION;  Surgeon: Marykay Lex, MD;  Location: [facility] INVASIVE CV LAB;  Service: Cardiovascular;  Laterality: N/A;   KNEE ARTHROSCOPY     LEFT HEART CATH AND CORONARY ANGIOGRAPHY N/A 06/23/2017   Procedure: LEFT HEART CATH AND CORONARY ANGIOGRAPHY;  Surgeon: Marykay Lex, MD;  Location: [facility] INVASIVE CV LAB;  Service: Cardiovascular;  Laterality: N/A;   SHOULDER ARTHROSCOPY WITH BICEPSTENOTOMY Right  09/06/2019   Procedure: SHOULDER ARTHROSCOPY WITH BICEPSTENOTOMY;  Surgeon: Tarry Kos, MD;  Location: [facility];  Service: Orthopedics;  Laterality: Right;   SHOULDER ARTHROSCOPY WITH SUBACROMIAL DECOMPRESSION Right 09/06/2019   Procedure: RIGHT SHOULDER ARTHROSCOPY WITH EXTENSIVE DEBRIDEMENT, SUBACROMIAL DECOMPRESSION, BICEPS TENOTOMY;  Surgeon: Tarry Kos, MD;  Location: [facility];  Service: Orthopedics;  Laterality: Right;   TENOTOMY ACHILLES TENDON     Patient Active Problem List   Diagnosis Date Noted   Hallux rigidus, right foot 09/15/2021   Spondylolisthesis at L5-S1 level 05/09/2021   Low back pain at multiple sites 02/05/2021   Osteoarthritis    Heart murmur    Exercise-induced asthma    Diabetes mellitus without complication (HCC)    Arthritis    Anemia    Myofascial pain syndrome, cervical 07/28/2019   Lateral epicondylitis of right elbow 07/28/2019   Nontraumatic incomplete tear of right rotator cuff 07/25/2019   Tendinopathy of right biceps tendon 07/25/2019   Erectile dysfunction 07/11/2019   Mild persistent asthma/cough variant asthma 04/13/2019   Allergic rhinitis with a possible nonallergic component 04/13/2019   Acid reflux 04/13/2019   Chronic cough 04/13/2019   Hoarseness 04/13/2019   Cervical radicular pain 01/10/2019   Ulnar neuropathy at elbow of right upper extremity 01/03/2019   Acute bilateral low back pain without sciatica 01/03/2019   Onychomycosis 05/25/2018   Palpitations 07/06/2017   CAD (coronary artery disease) 07/05/2017   Family history of early CAD 07/05/2017   Abnormal findings on diagnostic imaging of cardiovascular system 06/23/2017   Low vitamin D level 04/22/2017   Pain in right ankle and joints of right foot 10/05/2016   Hemarthrosis of right elbow 02/03/2016   Small thenar eminence 02/03/2016   Cervical disc disorder with radiculopathy of cervical region 10/07/2015   Incomplete rotator cuff tear 09/10/2015   Nonischemic cardiomyopathy (HCC) 05/15/2015   Fatigue 05/15/2015   Dyspnea on exertion 05/15/2015   Decreased cardiac ejection fraction 04/02/2015   Strain of latissimus dorsi muscle 01/21/2015   Shoulder bursitis 10/12/2014   Spinal stenosis, lumbar region, without neurogenic claudication 10/12/2014   BPPV (benign paroxysmal positional vertigo) 08/14/2014   Hyperlipidemia 01/19/2014    Anemia, unspecified 01/19/2014   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   Type 2 diabetes mellitus with hyperglycemia, without long-term current use of insulin (HCC) 12/22/2013   Hypogonadism in male 12/22/2013   Arthritis of hand, left 12/22/2013    PCP: Dr Arva Chafe, DO  REFERRING PROVIDER: Dr Rodolph Bong   REFERRING DIAG:  Spondylolisthesis at L5-S1 level  Chronic bilateral low back pain with right-sided sciatica    Rationale for Evaluation and Treatment: Rehabilitation  THERAPY DIAG:  Spondylolisthesis at L5-S1 level  Chronic bilateral low back pain with right-sided sciatica  Other symptoms and signs involving the musculoskeletal system  Muscle weakness (generalized)  ONSET DATE: 12/12/22  SUBJECTIVE:  SUBJECTIVE STATEMENT:  Going to have an injection for the back on Wednesday. He did some yard work yesterday and this morning and has been doing his exercises. Hips hurt more than the back today. Aching on the back side of both hips. No sharp pain.    PERTINENT HISTORY:  Chronic LBP; bilat hamstring tightness; bilat knees; cervical disc surgery; AODM   PAIN:  Are you having pain? Yes: NPRS scale: 4-6/10 back 4/10, hips 6/10 Pain location: LB; R leg Pain description: dull aching in the LB;; sharp in the leg  Aggravating factors: back - sitting > 10 min; leg - turning foot/twisting  Relieving factors: meds   PRECAUTIONS: None    WEIGHT BEARING RESTRICTIONS: No  FALLS:  Has patient fallen in last 6 months? No  LIVING ENVIRONMENT: Lives with: lives with their spouse Lives in: House/apartment Stairs: Yes: Internal: 24 steps; on right going up and External: 3 steps; on right going up Has following equipment at home: None  OCCUPATION: retired  Conservation officer, [employer]; active playing pickle ball    PATIENT GOALS: stop most of the pain; get back to activities   NEXT MD VISIT: 02/09/23  OBJECTIVE:   DIAGNOSTIC FINDINGS:  Xray 12/29/22 - results unavailable   PATIENT SURVEYS:  FOTO 31; goals 48    SENSATION: WFL  MUSCLE LENGTH: Hamstrings: Right 70 deg; Left 65 deg Tight hip flexors bilat   POSTURE: rounded shoulders, forward head, decreased lumbar lordosis, increased thoracic kyphosis, flexed trunk , and weight shift right  PALPATION: Pain with PA mobs lumbar spine; R lumbar paraspinals; posterior hip in gluts and piriformis; hamstrings   LUMBAR ROM:   AROM eval  Flexion 70% pain bilat LB  Extension 10% pain R mid back   Right lateral flexion 65% pull L > R LB  Left lateral flexion 60% pull R LB  Right rotation 20% pain mid back  Left rotation 20% discomfort mid back    (Blank rows = not tested)  LOWER EXTREMITY ROM:     Active Assistive Right eval Left eval  Hip flexion    Hip extension 0 0  Hip abduction    Hip adduction    Hip internal rotation    Hip external rotation    Knee flexion 90 95  Knee extension -5 -5  Ankle dorsiflexion 0 0  Ankle plantarflexion    Ankle inversion    Ankle eversion     (Blank rows = not tested)  LOWER EXTREMITY MMT:    MMT Right eval Right  02/22/23 Left eval   Hip flexion 5- 5 5 5   Hip extension 4- 4+ 5- 5-  Hip abduction 3+ 4 4+ 5  Hip adduction      Hip internal rotation      Hip external rotation      Knee flexion 5  5   Knee extension 5  5   Ankle dorsiflexion      Ankle plantarflexion      Ankle inversion      Ankle eversion       (Blank rows = not tested)  LUMBAR SPECIAL TESTS:  Straight leg raise test: Negative and Slump test: Negative  FUNCTIONAL TESTS:  SLS: R 2 sec ; L 1 sec  5 times sit to stand: 23.5 sec  GAIT: Distance walked: 40  Assistive device utilized: None Level of assistance: Complete Independence Comments: flexed forward at trunk and  hips    [facility] Adult PT Treatment:  DATE: 02/22/2023 Therapeutic Exercise: Nustep L6 x 5 min Prone press ups x 10- abolishes L side pain  - 2nd set brings it back a little on L to 1-2/10 Prone on elbows - with thoracic protraction/retraction x 5  Prone press with alt hip ext 2 x 10 ea Bridge 2 x 10 Supine piriformis stretch travell 30 sec x 2 R/L (VC to avoid twisting) Sit to stand high low table UE's crossed over chest x 15 Partial lunge UE support on counter x 10 R/L Isometric row blue TB 10 sec x 10  Sports cord step back 10 sec x 10    Manual:  P/A mobs to L5/S1 and L4/5 grade   [facility] Adult PT Treatment:                                                DATE: 02/18/2023 Therapeutic Exercise: Manual:  P/A mobs to L5/S1 and L4/5 gd III abolishes R pain, centralizes L to 4/10 pain Prone press ups x 10 Prone on elbows - with thoracic protraction/retraction x 5  Prone press with alt hip ext 2 x 10 ea Bridge 2-3 sec x 2 x 10 Supine piriformis stretch B (to avoid twisting or bending spine) Sit to stand Mat + foam hands on knees x 10 Split squat x 10 B  [facility] Adult PT Treatment:                                                DATE: 02/15/2023 Therapeutic Exercise: Standing lumbar ext 5 sec hold x 10 - decreased to 0/10 Prone on elbows - Low back 6/10 Prone lying x 1 min some tingling in legs, prone over one pillow Prone on elbows  Lateral side glide L x 5 (feels good); to R hurts Bridge x 10 Standing and Seated QL stretch Sidebending Manual:  TPR to B QL in S/L UPA mobs while prone on pillow - gd II/III B abolishes leg sx   [facility] Adult PT Treatment:                                                DATE: 02/10/2023 Therapeutic Exercise: DKTC --> SKTC Figure 4 LTR  Dead bug with knee tap (single leg stretch) x6, x8 Supine:  Bridges + hip abd  GTB 2x10, Blue TB x10 HS stretch w/strap 3x30" (B) Seated dead lift + no weight --> 10#KB -->  15#KB Standing:  Squat --> focus on hip hinge mechanics & Wbing in heels Single leg dead lift with counter support PATIENT EDUCATION:  Education details: Updated HEP Person educated: Patient Education method: Explanation, Demonstration, Tactile cues, Verbal cues, and Handouts Education comprehension: verbalized understanding, returned demonstration, verbal cues required, tactile cues required, and needs further education  HOME EXERCISE PROGRAM: Access Code: [email or web address] URL: [email or web address] Date: 02/18/2023 Prepared by: Raynelle Fanning  Exercises - Isometric Dead Bug  - 2 x daily - 7 x weekly - 1 sets - 10 reps - 5-10 sec hold - Dead Bug  - 1 x daily - 7 x weekly - 3 sets - 10 reps -  Supine Single Knee to Chest Stretch  - 1 x daily - 7 x weekly - 1 sets - 10 reps - 10-20 sec hold - Supine Lower Trunk Rotation  - 1 x daily - 7 x weekly - 3 sets - 10 reps - Supine Hip External Rotation Stretch  - 1 x daily - 7 x weekly - 3 sets - 10 reps - Prone Hip Internal Rotation AROM  - 1 x daily - 7 x weekly - 3 sets - 10 reps - Quadruped Bent Leg Hip Extension  - 1 x daily - 7 x weekly - 3 sets - 10 reps - Squat with Chair Touch  - 1 x daily - 7 x weekly - 3 sets - 10 reps - Forward T with Counter Support  - 1 x daily - 7 x weekly - 3 sets - 10 reps - Standing Lumbar Extension at Wall - Forearms  - 4-5 x daily - 7 x weekly - 2 sets - 10 reps - Supine Bridge  - 2 x daily - 7 x weekly - 1-3 sets - 10 reps - Prone Press Up On Elbows  - 4 x daily - 7 x weekly - 1 sets - 1 reps - 1-5 min hold - Standing Quadratus Lumborum Mobilization with Small Ball on Wall  - 1 x daily - 3-4 x weekly - 1 sets - 10 reps - Prone Press Up  - 3-4 x daily - 7 x weekly - 2 sets - 10 reps - Split Squats Upright Trunk (Quad Bias)  - 1 x daily - 3-4 x weekly - 1-2 sets - 10 reps  ASSESSMENT:  CLINICAL IMPRESSION: Javan reports some flare up of pain in back and bilat hips which could be due to yard work yesterday.  He has continued pain into B hips. Improvement noted in LE strength. Continued focus on prone lumbar stab and LE strength to help improve body mechanics. Patient will benefit from continue work to decrease weakness in the core and posterior hips.    Eval: Patient is a 67 y.o. male who was seen today for physical therapy evaluation and treatment for  Spondylolisthesis at L5-S1 level and   Chronic bilateral low back pain with right-sided sciatica  .   OBJECTIVE IMPAIRMENTS: Abnormal gait, decreased activity tolerance, decreased endurance, decreased mobility, decreased ROM, hypomobility, increased fascial restrictions, increased muscle spasms, impaired flexibility, improper body mechanics, postural dysfunction, and pain.   GOALS: Goals reviewed with patient? Yes  SHORT TERM GOALS: Target date: 02/10/2023  Independent in initial HEP  Baseline: Goal status: MET  2.  Decrease LB and R LE pain by 25-50% allowing patient to return to normal activities  Baseline: R LE pain improved 80%, LBP pain no change Goal status: IN PROGRESS   LONG TERM GOALS: Target date: 03/24/2023  Increase hamstring flexibility to ~ 75 deg bilat  Baseline:  Goal status: INITIAL  2.  Trunk ROM/mobility WFL's and minimal to no pain  Baseline:  Goal status: INITIAL  3.  Increase strength R LE to 4+/5 to 5/5  Baseline:  Goal status: INITIAL  4.  Patient reports return to regular activities including regular exercise - yoga and water exercises Baseline:  Goal status: INITIAL  5.  Independent in HEP including aquatic program as indicated  Baseline:  Goal status: INITIAL  6.  Improve functional limitation score to 48 Baseline:  Goal status: INITIAL  PLAN:  PT FREQUENCY: 2x/week  PT DURATION: 12 weeks  PLANNED INTERVENTIONS:  Therapeutic exercises, Therapeutic activity, Neuromuscular re-education, Balance training, Gait training, Patient/Family education, Self Care, Joint mobilization, Aquatic Therapy,  Dry Needling, Electrical stimulation, Spinal mobilization, Cryotherapy, Moist heat, Taping, Traction, Ultrasound, Ionotophoresis 4mg /ml Dexamethasone, Manual therapy, and Re-evaluation.  PLAN FOR NEXT SESSION: Progress exercises; work on functional LE strengthening, leg press, lunges; extension biased activities. continue efforts to educate patient re back care and body mechanics; manual work, DN, modalities as indicated.    Rasheem Figiel P. Leonor Liv PT, MPH 02/22/23 1:17 PM

## 2023-02-23 NOTE — Discharge Instructions (Signed)
Post Procedure Spinal Discharge Instruction Sheet  You may resume a regular diet and any medications that you routinely take (including pain medications) unless otherwise noted by MD.  No driving day of procedure.  Light activity throughout the rest of the day.  Do not do any strenuous work, exercise, bending or lifting.  The day following the procedure, you can resume normal physical activity but you should refrain from exercising or physical therapy for at least three days thereafter.  You may apply ice to the injection site, 20 minutes on, 20 minutes off, as needed. Do not apply ice directly to skin.    Common Side Effects:  Headaches- take your usual medications as directed by your physician.  Increase your fluid intake.  Caffeinated beverages may be helpful.  Lie flat in bed until your headache resolves.  Restlessness or inability to sleep- you may have trouble sleeping for the next few days.  Ask your referring physician if you need any medication for sleep.  Facial flushing or redness- should subside within a few days.  Increased pain- a temporary increase in pain a day or two following your procedure is not unusual.  Take your pain medication as prescribed by your referring physician.  Leg cramps  Please contact our office at [phone] for the following symptoms: Fever greater than 100 degrees. Headaches unresolved with medication after 2-3 days. Increased swelling, pain, or redness at injection site.   Thank you for visiting [facility] today.   YOU MAY RESUME YOUR PLAVIX TODAY, POST PROCEDURE

## 2023-02-24 ENCOUNTER — Encounter: Payer: Self-pay | Admitting: Family Medicine

## 2023-02-24 ENCOUNTER — Ambulatory Visit
Admission: RE | Admit: 2023-02-24 | Discharge: 2023-02-24 | Disposition: A | Discharge status: Home / Self Care | Payer: Commercial Managed Care - PPO | Source: Ambulatory Visit | Attending: Family Medicine | Admitting: Family Medicine

## 2023-02-24 ENCOUNTER — Other Ambulatory Visit (HOSPITAL_BASED_OUTPATIENT_CLINIC_OR_DEPARTMENT_OTHER): Payer: Self-pay

## 2023-02-24 DIAGNOSIS — M4807 Spinal stenosis, lumbosacral region: Secondary | ICD-10-CM | POA: Diagnosis not present

## 2023-02-24 DIAGNOSIS — M48061 Spinal stenosis, lumbar region without neurogenic claudication: Secondary | ICD-10-CM | POA: Diagnosis not present

## 2023-02-24 DIAGNOSIS — G8929 Other chronic pain: Secondary | ICD-10-CM

## 2023-02-24 DIAGNOSIS — M47816 Spondylosis without myelopathy or radiculopathy, lumbar region: Secondary | ICD-10-CM | POA: Diagnosis not present

## 2023-02-24 MED ORDER — IOPAMIDOL (ISOVUE-M 200) INJECTION 41%
1.0000 mL | Freq: Once | INTRAMUSCULAR | Status: AC
  Administered 2023-02-24: 1 mL via EPIDURAL

## 2023-02-24 MED ORDER — METHYLPREDNISOLONE ACETATE 40 MG/ML INJ SUSP (RADIOLOG
80.0000 mg | Freq: Once | INTRAMUSCULAR | Status: AC
  Administered 2023-02-24: 80 mg via EPIDURAL

## 2023-02-25 ENCOUNTER — Ambulatory Visit: Payer: Commercial Managed Care - PPO | Admitting: Rehabilitative and Restorative Service Providers"

## 2023-02-25 NOTE — Progress Notes (Signed)
Lumbar spine x-ray shows some arthritis changes.

## 2023-02-26 ENCOUNTER — Encounter: Payer: Self-pay | Admitting: Family Medicine

## 2023-02-27 ENCOUNTER — Other Ambulatory Visit: Payer: Self-pay | Admitting: Cardiology

## 2023-03-01 ENCOUNTER — Other Ambulatory Visit (HOSPITAL_BASED_OUTPATIENT_CLINIC_OR_DEPARTMENT_OTHER): Payer: Self-pay

## 2023-03-01 ENCOUNTER — Encounter: Payer: Self-pay | Admitting: Rehabilitative and Restorative Service Providers"

## 2023-03-01 ENCOUNTER — Ambulatory Visit: Payer: Commercial Managed Care - PPO | Admitting: Rehabilitative and Restorative Service Providers"

## 2023-03-01 ENCOUNTER — Other Ambulatory Visit: Payer: Self-pay | Admitting: Family Medicine

## 2023-03-01 ENCOUNTER — Other Ambulatory Visit: Payer: Self-pay

## 2023-03-01 DIAGNOSIS — G8929 Other chronic pain: Secondary | ICD-10-CM | POA: Diagnosis not present

## 2023-03-01 DIAGNOSIS — R29898 Other symptoms and signs involving the musculoskeletal system: Secondary | ICD-10-CM | POA: Diagnosis not present

## 2023-03-01 DIAGNOSIS — M4317 Spondylolisthesis, lumbosacral region: Secondary | ICD-10-CM | POA: Diagnosis not present

## 2023-03-01 DIAGNOSIS — M5441 Lumbago with sciatica, right side: Secondary | ICD-10-CM | POA: Diagnosis not present

## 2023-03-01 DIAGNOSIS — M6281 Muscle weakness (generalized): Secondary | ICD-10-CM

## 2023-03-01 MED ORDER — ROSUVASTATIN CALCIUM 5 MG PO TABS
5.0000 mg | ORAL_TABLET | Freq: Every day | ORAL | 3 refills | Status: DC
  Filled 2023-03-01: qty 90, 90d supply, fill #0
  Filled 2023-05-16: qty 90, 90d supply, fill #1
  Filled 2023-06-07 – 2023-08-27 (×2): qty 90, 90d supply, fill #2
  Filled 2023-09-29 – 2023-11-28 (×2): qty 90, 90d supply, fill #3

## 2023-03-01 MED ORDER — EZETIMIBE 10 MG PO TABS
10.0000 mg | ORAL_TABLET | Freq: Every day | ORAL | 2 refills | Status: DC
  Filled 2023-03-01: qty 90, 90d supply, fill #0
  Filled 2023-05-28: qty 90, 90d supply, fill #1
  Filled 2023-08-27: qty 90, 90d supply, fill #2

## 2023-03-01 NOTE — Therapy (Signed)
OUTPATIENT PHYSICAL THERAPY THORACOLUMBAR TREATMENT   Patient Name: Reginald Rios MRN: [id number] DOB:April 29, 1956, 67 y.o., male Today's Date: 03/01/2023  END OF SESSION:  PT End of Session - 03/01/23 1102     Visit Number 14    Number of Visits 24    Date for PT Re-Evaluation 03/24/23    Authorization Type [employer] $20 copay    PT Start Time 1100    PT Stop Time 1148    PT Time Calculation (min) 48 min    Activity Tolerance Patient tolerated treatment well              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   BPPV (benign paroxysmal positional vertigo) 08/14/2014   CAD (coronary artery disease)    2/19 PCI/DESx1 to Lcx   Cervical disc disorder with radiculopathy of cervical region 10/07/2015   Cervical radicular pain 01/10/2019   Chronic cough 04/13/2019   Decreased cardiac ejection fraction 04/02/2015   Diabetes mellitus without complication (HCC)    Dyspnea on exertion 05/15/2015   Erectile dysfunction 07/11/2019   Exercise-induced asthma    Family history of early CAD 07/05/2017   Fatigue 05/15/2015   Hallux rigidus, right foot 09/15/2021   Heart murmur    Hemarthrosis of right elbow 02/03/2016   Hoarseness 04/13/2019   Hyperlipidemia    family hx of high cholesterol   Hypogonadism in male    Incomplete rotator cuff tear 09/10/2015   Injected 09/10/2015   Lateral epicondylitis of right elbow 07/28/2019   Low back pain at multiple sites 02/05/2021   Low vitamin D level 04/22/2017   Mild persistent asthma/cough variant asthma 04/13/2019   Myofascial  pain syndrome, cervical 07/28/2019   Nonischemic cardiomyopathy (HCC) 05/15/2015   Nontraumatic incomplete tear of right rotator cuff 07/25/2019   Onychomycosis 05/25/2018   Osteoarthritis    Pain in right ankle and joints of right foot 10/05/2016   Palpitations 07/06/2017   Shoulder bursitis 10/12/2014   Injected in 10/12/2014  Repeat 01/21/2015   Small thenar eminence 02/03/2016   Spinal stenosis, lumbar region, without neurogenic claudication 10/12/2014   Spondylolisthesis at L5-S1 level 05/09/2021   Strain of latissimus dorsi muscle 01/21/2015   Tendinopathy of right biceps tendon 07/25/2019   Type 2 diabetes mellitus with hyperglycemia, without long-term current use of insulin (HCC) 12/22/2013   Ulnar neuropathy at elbow of right upper extremity 01/03/2019   Past Surgical History:  Procedure Laterality Date   CORONARY PRESSURE/FFR STUDY N/A 06/23/2017   Procedure: INTRAVASCULAR PRESSURE WIRE/FFR STUDY;  Surgeon: Marykay Lex, MD;  Location: [facility] INVASIVE CV LAB;  Service: Cardiovascular;  Laterality: N/A;   CORONARY STENT INTERVENTION N/A 06/23/2017   Procedure: CORONARY STENT INTERVENTION;  Surgeon: Marykay Lex, MD;  Location: [facility] INVASIVE CV LAB;  Service: Cardiovascular;  Laterality: N/A;   KNEE ARTHROSCOPY     LEFT HEART CATH AND CORONARY ANGIOGRAPHY N/A 06/23/2017   Procedure: LEFT HEART CATH AND CORONARY ANGIOGRAPHY;  Surgeon: Marykay Lex, MD;  Location: [facility] INVASIVE CV LAB;  Service: Cardiovascular;  Laterality: N/A;   SHOULDER ARTHROSCOPY WITH BICEPSTENOTOMY Right  09/06/2019   Procedure: SHOULDER ARTHROSCOPY WITH BICEPSTENOTOMY;  Surgeon: Tarry Kos, MD;  Location: [facility];  Service: Orthopedics;  Laterality: Right;   SHOULDER ARTHROSCOPY WITH SUBACROMIAL DECOMPRESSION Right 09/06/2019   Procedure: RIGHT SHOULDER ARTHROSCOPY WITH EXTENSIVE DEBRIDEMENT, SUBACROMIAL DECOMPRESSION, BICEPS TENOTOMY;  Surgeon: Tarry Kos, MD;  Location: [facility];  Service: Orthopedics;  Laterality: Right;   TENOTOMY ACHILLES TENDON     Patient Active Problem List   Diagnosis Date Noted   Hallux rigidus, right foot 09/15/2021   Spondylolisthesis at L5-S1 level 05/09/2021   Low back pain at multiple sites 02/05/2021   Osteoarthritis    Heart murmur    Exercise-induced asthma    Diabetes mellitus without complication (HCC)    Arthritis    Anemia    Myofascial pain syndrome, cervical 07/28/2019   Lateral epicondylitis of right elbow 07/28/2019   Nontraumatic incomplete tear of right rotator cuff 07/25/2019   Tendinopathy of right biceps tendon 07/25/2019   Erectile dysfunction 07/11/2019   Mild persistent asthma/cough variant asthma 04/13/2019   Allergic rhinitis with a possible nonallergic component 04/13/2019   Acid reflux 04/13/2019   Chronic cough 04/13/2019   Hoarseness 04/13/2019   Cervical radicular pain 01/10/2019   Ulnar neuropathy at elbow of right upper extremity 01/03/2019   Acute bilateral low back pain without sciatica 01/03/2019   Onychomycosis 05/25/2018   Palpitations 07/06/2017   CAD (coronary artery disease) 07/05/2017   Family history of early CAD 07/05/2017   Abnormal findings on diagnostic imaging of cardiovascular system 06/23/2017   Low vitamin D level 04/22/2017   Pain in right ankle and joints of right foot 10/05/2016   Hemarthrosis of right elbow 02/03/2016   Small thenar eminence 02/03/2016   Cervical disc disorder with radiculopathy of cervical region 10/07/2015   Incomplete rotator cuff tear 09/10/2015   Nonischemic cardiomyopathy (HCC) 05/15/2015   Fatigue 05/15/2015   Dyspnea on exertion 05/15/2015   Decreased cardiac ejection fraction 04/02/2015   Strain of latissimus dorsi muscle 01/21/2015   Shoulder bursitis 10/12/2014   Spinal stenosis, lumbar region, without neurogenic claudication 10/12/2014   BPPV (benign paroxysmal positional vertigo) 08/14/2014   Hyperlipidemia 01/19/2014    Anemia, unspecified 01/19/2014   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   Type 2 diabetes mellitus with hyperglycemia, without long-term current use of insulin (HCC) 12/22/2013   Hypogonadism in male 12/22/2013   Arthritis of hand, left 12/22/2013    PCP: Dr Arva Chafe, DO  REFERRING PROVIDER: Dr Rodolph Bong   REFERRING DIAG:  Spondylolisthesis at L5-S1 level  Chronic bilateral low back pain with right-sided sciatica    Rationale for Evaluation and Treatment: Rehabilitation  THERAPY DIAG:  Spondylolisthesis at L5-S1 level  Chronic bilateral low back pain with right-sided sciatica  Other symptoms and signs involving the musculoskeletal system  Muscle weakness (generalized)  ONSET DATE: 12/12/22  SUBJECTIVE:  SUBJECTIVE STATEMENT:  Had an injection last Wednesday, 02/24/23. The injection did not change the back pain but it felt he felt looser. Did a few exercises yesterday and washed dishes with no change in pain last night. He washed his car Saturday, 02/27/23. He awoke this morning with increased pain in the back which is more diffuse and less centered. He has pain into the legs in both knees. Discussed importance of avoiding activities that irritate back pain and continuing with exercises as instructed in PT. He was encouraged to resume a walking program level surfaces.    PERTINENT HISTORY:  Chronic LBP; bilat hamstring tightness; bilat knees; cervical disc surgery; AODM   PAIN:  Are you having pain? Yes: NPRS scale: 8/10 back 7/10 hips Pain location: LB; bilat leg Pain description: dull aching in the LB; not as sharp more diffuse Aggravating factors: back - sitting > 10 min; leg - turning foot/twisting  Relieving factors: meds   PRECAUTIONS:  None    WEIGHT BEARING RESTRICTIONS: No  FALLS:  Has patient fallen in last 6 months? No  LIVING ENVIRONMENT: Lives with: lives with their spouse Lives in: House/apartment Stairs: Yes: Internal: 24 steps; on right going up and External: 3 steps; on right going up Has following equipment at home: None  OCCUPATION: retired [employer] officer, [employer]; active playing pickle ball    PATIENT GOALS: stop most of the pain; get back to activities   NEXT MD VISIT: 02/09/23  OBJECTIVE:   DIAGNOSTIC FINDINGS:  Xray 12/29/22 - results unavailable   PATIENT SURVEYS:  FOTO 31; goals 48    SENSATION: WFL  MUSCLE LENGTH: Hamstrings: Right 70 deg; Left 65 deg Tight hip flexors bilat   POSTURE: rounded shoulders, forward head, decreased lumbar lordosis, increased thoracic kyphosis, flexed trunk , and weight shift right  PALPATION: Pain with PA mobs lumbar spine; R lumbar paraspinals; posterior hip in gluts and piriformis; hamstrings   LUMBAR ROM:   AROM eval  Flexion 70% pain bilat LB  Extension 10% pain R mid back   Right lateral flexion 65% pull L > R LB  Left lateral flexion 60% pull R LB  Right rotation 20% pain mid back  Left rotation 20% discomfort mid back    (Blank rows = not tested)  LOWER EXTREMITY ROM:     Active Assistive Right eval Left eval  Hip flexion    Hip extension 0 0  Hip abduction    Hip adduction    Hip internal rotation    Hip external rotation    Knee flexion 90 95  Knee extension -5 -5  Ankle dorsiflexion 0 0  Ankle plantarflexion    Ankle inversion    Ankle eversion     (Blank rows = not tested)  LOWER EXTREMITY MMT:    MMT Right eval Right  02/22/23 Left eval   Hip flexion 5- 5 5 5   Hip extension 4- 4+ 5- 5-  Hip abduction 3+ 4 4+ 5  Hip adduction      Hip internal rotation      Hip external rotation      Knee flexion 5  5   Knee extension 5  5   Ankle dorsiflexion      Ankle plantarflexion      Ankle inversion      Ankle eversion        (Blank rows = not tested)  LUMBAR SPECIAL TESTS:  Straight leg raise test: Negative and Slump test: Negative  FUNCTIONAL TESTS:  SLS: R 2 sec ; L 1 sec  5 times sit to stand: 23.5 sec  GAIT: Distance walked: 40  Assistive device utilized: None Level of assistance: Complete Independence Comments: flexed forward at trunk and hips    [facility] Adult PT Treatment:                                                DATE: 03/01/2023 Therapeutic Exercise: Nustep L6 x 8 min Sitting  trial of anterior posterior pelvic tilt x 10- pt reports some pain in L LB area  Sit to stand high low table UE's crossed over chest x 15 Supine  gentle trunk rotation x 10  marching VC to engage core x 10 R/L  Sciatic nerve mobilization x 10 R/L  Supine piriformis stretch travell 30 sec x 2 R/L (VC to avoid twisting) Bridge 5 sec hold x 10 Dead bug alternating U/LE's x 10 (VC to engage core) Alternate knee drop in hooklying feet apart dropping knee in x 10 R/L  Prone Prone lying pillow under chest x 2 min Prone press ups through partial range x 10 Prone on elbows - with thoracic protraction/retraction x 10  Prone press with alt hip ext 2 x 10 ea Standing  Isometric row blue TB 10 sec x 10  Partial lunge UE support on counter x 10 R/L      [facility] Adult PT Treatment:                                                DATE: 02/22/2023 Therapeutic Exercise: Nustep L6 x 5 min Prone press ups x 10- abolishes L side pain  - 2nd set brings it back a little on L to 1-2/10 Prone on elbows - with thoracic protraction/retraction x 5  Prone press with alt hip ext 2 x 10 ea Bridge 2 x 10 Supine piriformis stretch travell 30 sec x 2 R/L (VC to avoid twisting) Sit to stand high low table UE's crossed over chest x 15 Partial lunge UE support on counter x 10 R/L Isometric row blue TB 10 sec x 10  Sports cord step back 10 sec x 10    Manual:  P/A mobs to L5/S1 and L4/5 grade   [facility] Adult PT Treatment:                                                 DATE: 02/18/2023 Therapeutic Exercise: Manual:  P/A mobs to L5/S1 and L4/5 gd III abolishes R pain, centralizes L to 4/10 pain Prone press ups x 10 Prone on elbows - with thoracic protraction/retraction x 5  Prone press with alt hip ext 2 x 10 ea Bridge 2-3 sec x 2 x 10 Supine piriformis stretch B (to avoid twisting or bending spine) Sit to stand Mat + foam hands on knees x 10 Split squat x 10 B   HOME EXERCISE PROGRAM: Access Code: [email or web address] URL: [email or web address] Date: 02/18/2023 Prepared by: Raynelle Fanning  Exercises - Isometric Dead Bug  - 2 x daily - 7 x weekly - 1 sets - 10 reps -  5-10 sec hold - Dead Bug  - 1 x daily - 7 x weekly - 3 sets - 10 reps - Supine Single Knee to Chest Stretch  - 1 x daily - 7 x weekly - 1 sets - 10 reps - 10-20 sec hold - Supine Lower Trunk Rotation  - 1 x daily - 7 x weekly - 3 sets - 10 reps - Supine Hip External Rotation Stretch  - 1 x daily - 7 x weekly - 3 sets - 10 reps - Prone Hip Internal Rotation AROM  - 1 x daily - 7 x weekly - 3 sets - 10 reps - Quadruped Bent Leg Hip Extension  - 1 x daily - 7 x weekly - 3 sets - 10 reps - Squat with Chair Touch  - 1 x daily - 7 x weekly - 3 sets - 10 reps - Forward T with Counter Support  - 1 x daily - 7 x weekly - 3 sets - 10 reps - Standing Lumbar Extension at Wall - Forearms  - 4-5 x daily - 7 x weekly - 2 sets - 10 reps - Supine Bridge  - 2 x daily - 7 x weekly - 1-3 sets - 10 reps - Prone Press Up On Elbows  - 4 x daily - 7 x weekly - 1 sets - 1 reps - 1-5 min hold - Standing Quadratus Lumborum Mobilization with Small Ball on Wall  - 1 x daily - 3-4 x weekly - 1 sets - 10 reps - Prone Press Up  - 3-4 x daily - 7 x weekly - 2 sets - 10 reps - Split Squats Upright Trunk (Quad Bias)  - 1 x daily - 3-4 x weekly - 1-2 sets - 10 reps  ASSESSMENT:  CLINICAL IMPRESSION: Khael reports significant increase in back pain with pain into bilat hips to knees when he awoke  this morning. He washed his car Saturday and sis a few exercises yesterday because he felt stiff. He washed dishes and walked the dog yesterday. Trial of exercises to address flare up of pain with Nicoles reporting/demonstrating decreased pain and improved freedom of movement following exercises.    Eval: Patient is a 67 y.o. male who was seen today for physical therapy evaluation and treatment for  Spondylolisthesis at L5-S1 level and   Chronic bilateral low back pain with right-sided sciatica  .   OBJECTIVE IMPAIRMENTS: Abnormal gait, decreased activity tolerance, decreased endurance, decreased mobility, decreased ROM, hypomobility, increased fascial restrictions, increased muscle spasms, impaired flexibility, improper body mechanics, postural dysfunction, and pain.   GOALS: Goals reviewed with patient? Yes  SHORT TERM GOALS: Target date: 02/10/2023  Independent in initial HEP  Baseline: Goal status: MET  2.  Decrease LB and R LE pain by 25-50% allowing patient to return to normal activities  Baseline: R LE pain improved 80%, LBP pain no change Goal status: IN PROGRESS   LONG TERM GOALS: Target date: 03/24/2023  Increase hamstring flexibility to ~ 75 deg bilat  Baseline:  Goal status: INITIAL  2.  Trunk ROM/mobility WFL's and minimal to no pain  Baseline:  Goal status: INITIAL  3.  Increase strength R LE to 4+/5 to 5/5  Baseline:  Goal status: INITIAL  4.  Patient reports return to regular activities including regular exercise - yoga and water exercises Baseline:  Goal status: INITIAL  5.  Independent in HEP including aquatic program as indicated  Baseline:  Goal status: INITIAL  6.  Improve  functional limitation score to 48 Baseline:  Goal status: INITIAL  PLAN:  PT FREQUENCY: 2x/week  PT DURATION: 12 weeks  PLANNED INTERVENTIONS: Therapeutic exercises, Therapeutic activity, Neuromuscular re-education, Balance training, Gait training, Patient/Family education,  Self Care, Joint mobilization, Aquatic Therapy, Dry Needling, Electrical stimulation, Spinal mobilization, Cryotherapy, Moist heat, Taping, Traction, Ultrasound, Ionotophoresis 4mg /ml Dexamethasone, Manual therapy, and Re-evaluation.  PLAN FOR NEXT SESSION: Progress exercises; work on functional LE strengthening, leg press, lunges; extension biased activities. continue efforts to educate patient re back care and body mechanics; manual work, DN, modalities as indicated.    Dalton Molesworth P. Leonor Liv PT, MPH 03/01/23 11:03 AM

## 2023-03-03 ENCOUNTER — Other Ambulatory Visit: Payer: Self-pay

## 2023-03-03 ENCOUNTER — Encounter: Payer: Self-pay | Admitting: Rehabilitative and Restorative Service Providers"

## 2023-03-03 ENCOUNTER — Ambulatory Visit: Payer: Commercial Managed Care - PPO | Admitting: Rehabilitative and Restorative Service Providers"

## 2023-03-03 DIAGNOSIS — R29898 Other symptoms and signs involving the musculoskeletal system: Secondary | ICD-10-CM | POA: Diagnosis not present

## 2023-03-03 DIAGNOSIS — M6281 Muscle weakness (generalized): Secondary | ICD-10-CM

## 2023-03-03 DIAGNOSIS — M4317 Spondylolisthesis, lumbosacral region: Secondary | ICD-10-CM | POA: Diagnosis not present

## 2023-03-03 DIAGNOSIS — E1159 Type 2 diabetes mellitus with other circulatory complications: Secondary | ICD-10-CM

## 2023-03-03 DIAGNOSIS — G8929 Other chronic pain: Secondary | ICD-10-CM

## 2023-03-03 DIAGNOSIS — E1165 Type 2 diabetes mellitus with hyperglycemia: Secondary | ICD-10-CM

## 2023-03-03 DIAGNOSIS — M5441 Lumbago with sciatica, right side: Secondary | ICD-10-CM | POA: Diagnosis not present

## 2023-03-03 NOTE — Telephone Encounter (Signed)
Called pt was advised, stated understand. Lab appt scheduled. Labs ordered.

## 2023-03-03 NOTE — Therapy (Signed)
OUTPATIENT PHYSICAL THERAPY THORACOLUMBAR TREATMENT   Patient Name: Reginald Rios MRN: [id number] DOB:05-07-1955, 67 y.o., male Today's Date: 03/03/2023  END OF SESSION:  PT End of Session - 03/03/23 1009     Visit Number 15    Number of Visits 24    Date for PT Re-Evaluation 03/24/23    Authorization Type [employer] $20 copay    Progress Note Due on Visit 20    PT Start Time 1015    PT Stop Time 1100    PT Time Calculation (min) 45 min    Activity Tolerance Patient tolerated treatment well              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   BPPV (benign paroxysmal positional vertigo) 08/14/2014   CAD (coronary artery disease)    2/19 PCI/DESx1 to Lcx   Cervical disc disorder with radiculopathy of cervical region 10/07/2015   Cervical radicular pain 01/10/2019   Chronic cough 04/13/2019   Decreased cardiac ejection fraction 04/02/2015   Diabetes mellitus without complication (HCC)    Dyspnea on exertion 05/15/2015   Erectile dysfunction 07/11/2019   Exercise-induced asthma    Family history of early CAD 07/05/2017   Fatigue 05/15/2015   Hallux rigidus, right foot 09/15/2021   Heart murmur    Hemarthrosis of right elbow 02/03/2016   Hoarseness 04/13/2019   Hyperlipidemia    family hx of high cholesterol   Hypogonadism in male    Incomplete rotator cuff tear 09/10/2015   Injected 09/10/2015   Lateral epicondylitis of right elbow 07/28/2019   Low back pain at multiple sites 02/05/2021   Low vitamin D level 04/22/2017   Mild persistent asthma/cough  variant asthma 04/13/2019   Myofascial pain syndrome, cervical 07/28/2019   Nonischemic cardiomyopathy (HCC) 05/15/2015   Nontraumatic incomplete tear of right rotator cuff 07/25/2019   Onychomycosis 05/25/2018   Osteoarthritis    Pain in right ankle and joints of right foot 10/05/2016   Palpitations 07/06/2017   Shoulder bursitis 10/12/2014   Injected in 10/12/2014  Repeat 01/21/2015   Small thenar eminence 02/03/2016   Spinal stenosis, lumbar region, without neurogenic claudication 10/12/2014   Spondylolisthesis at L5-S1 level 05/09/2021   Strain of latissimus dorsi muscle 01/21/2015   Tendinopathy of right biceps tendon 07/25/2019   Type 2 diabetes mellitus with hyperglycemia, without long-term current use of insulin (HCC) 12/22/2013   Ulnar neuropathy at elbow of right upper extremity 01/03/2019   Past Surgical History:  Procedure Laterality Date   CORONARY PRESSURE/FFR STUDY N/A 06/23/2017   Procedure: INTRAVASCULAR PRESSURE WIRE/FFR STUDY;  Surgeon: Marykay Lex, MD;  Location: [facility] INVASIVE CV LAB;  Service: Cardiovascular;  Laterality: N/A;   CORONARY STENT INTERVENTION N/A 06/23/2017   Procedure: CORONARY STENT INTERVENTION;  Surgeon: Marykay Lex, MD;  Location: [facility] INVASIVE CV LAB;  Service: Cardiovascular;  Laterality: N/A;   KNEE ARTHROSCOPY     LEFT HEART CATH AND CORONARY ANGIOGRAPHY N/A 06/23/2017   Procedure: LEFT HEART CATH AND CORONARY ANGIOGRAPHY;  Surgeon: Marykay Lex, MD;  Location: [facility] INVASIVE CV LAB;  Service: Cardiovascular;  Laterality: N/A;   SHOULDER ARTHROSCOPY WITH BICEPSTENOTOMY Right 09/06/2019   Procedure: SHOULDER ARTHROSCOPY WITH BICEPSTENOTOMY;  Surgeon: Tarry Kos, MD;  Location: [facility];  Service: Orthopedics;  Laterality: Right;   SHOULDER ARTHROSCOPY WITH SUBACROMIAL DECOMPRESSION Right 09/06/2019   Procedure: RIGHT SHOULDER ARTHROSCOPY WITH EXTENSIVE DEBRIDEMENT, SUBACROMIAL DECOMPRESSION, BICEPS TENOTOMY;  Surgeon: Tarry Kos, MD;  Location: [facility];  Service: Orthopedics;  Laterality: Right;   TENOTOMY ACHILLES TENDON     Patient Active Problem List   Diagnosis Date Noted   Hallux rigidus, right foot 09/15/2021   Spondylolisthesis at L5-S1 level 05/09/2021   Low back pain at multiple sites 02/05/2021   Osteoarthritis    Heart murmur    Exercise-induced asthma    Diabetes mellitus without complication (HCC)    Arthritis    Anemia    Myofascial pain syndrome, cervical 07/28/2019   Lateral epicondylitis of right elbow 07/28/2019   Nontraumatic incomplete tear of right rotator cuff 07/25/2019   Tendinopathy of right biceps tendon 07/25/2019   Erectile dysfunction 07/11/2019   Mild persistent asthma/cough variant asthma 04/13/2019   Allergic rhinitis with a possible nonallergic component 04/13/2019   Acid reflux 04/13/2019   Chronic cough 04/13/2019   Hoarseness 04/13/2019   Cervical radicular pain 01/10/2019   Ulnar neuropathy at elbow of right upper extremity 01/03/2019   Acute bilateral low back pain without sciatica 01/03/2019   Onychomycosis 05/25/2018   Palpitations 07/06/2017   CAD (coronary artery disease) 07/05/2017   Family history of early CAD 07/05/2017   Abnormal findings on diagnostic imaging of cardiovascular system 06/23/2017   Low vitamin D level 04/22/2017   Pain in right ankle and joints of right foot 10/05/2016   Hemarthrosis of right elbow 02/03/2016   Small thenar eminence 02/03/2016   Cervical disc disorder with radiculopathy of cervical region 10/07/2015   Incomplete rotator cuff tear 09/10/2015   Nonischemic cardiomyopathy (HCC) 05/15/2015   Fatigue 05/15/2015   Dyspnea on exertion 05/15/2015   Decreased cardiac ejection fraction 04/02/2015   Strain of latissimus dorsi muscle 01/21/2015   Shoulder bursitis 10/12/2014   Spinal stenosis, lumbar region, without neurogenic claudication 10/12/2014   BPPV (benign paroxysmal positional vertigo)  08/14/2014   Hyperlipidemia 01/19/2014   Anemia, unspecified 01/19/2014   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   Type 2 diabetes mellitus with hyperglycemia, without long-term current use of insulin (HCC) 12/22/2013   Hypogonadism in male 12/22/2013   Arthritis of hand, left 12/22/2013    PCP: Dr Arva Chafe, DO  REFERRING PROVIDER: Dr Rodolph Bong   REFERRING DIAG:  Spondylolisthesis at L5-S1 level  Chronic bilateral low back pain with right-sided sciatica    Rationale for Evaluation and Treatment: Rehabilitation  THERAPY DIAG:  Spondylolisthesis at L5-S1 level  Chronic bilateral low back pain with right-sided sciatica  Other symptoms and signs involving the musculoskeletal system  Muscle weakness (generalized)  ONSET DATE: 12/12/22  SUBJECTIVE:  SUBJECTIVE STATEMENT:  Patient reports that pain is worse than it was Monday. He has a lot of shooting down his legs "wrapping around" his knees and pain is kinda going up his back and he feels it in his hands. He did okay yesterday. He went shopping ans walked some, made dinner and then did some exercises last night. The exercises at the end of the day did not help. Pain is a 8 or 9 today. Did walk the dog this morning and iced the back. Had an injection last Wednesday, 02/24/23. The injection did not change the back pain but it felt he felt looser. Discussed importance of avoiding activities that irritate back pain and continuing with exercises as instructed in PT. Has been sitting in a recliner at home. Discussed that he needs to avoid sitting in a recliner which increases back pain. He was encouraged to resume a walking program level surfaces.    PERTINENT HISTORY:  Chronic LBP; bilat hamstring tightness; bilat  knees; cervical disc surgery; AODM   PAIN:  Are you having pain? Yes: NPRS scale: 8-9/10 back 7/10 hips Pain location: LB; bilat leg Pain description: dull aching in the LB; not as sharp more diffuse Aggravating factors: back - sitting > 10 min; leg - turning foot/twisting  Relieving factors: meds   PRECAUTIONS: None    WEIGHT BEARING RESTRICTIONS: No  FALLS:  Has patient fallen in last 6 months? No  LIVING ENVIRONMENT: Lives with: lives with their spouse Lives in: House/apartment Stairs: Yes: Internal: 24 steps; on right going up and External: 3 steps; on right going up Has following equipment at home: None  OCCUPATION: retired [employer] officer, [employer]; active playing pickle ball    PATIENT GOALS: stop most of the pain; get back to activities   NEXT MD VISIT: 02/09/23  OBJECTIVE:   DIAGNOSTIC FINDINGS:  Xray 12/29/22 - results unavailable   PATIENT SURVEYS:  FOTO 31; goals 48    SENSATION: WFL  MUSCLE LENGTH: Hamstrings: Right 70 deg; Left 65 deg Tight hip flexors bilat   POSTURE: rounded shoulders, forward head, decreased lumbar lordosis, increased thoracic kyphosis, flexed trunk , and weight shift right  PALPATION: Pain with PA mobs lumbar spine; R lumbar paraspinals; posterior hip in gluts and piriformis; hamstrings   LUMBAR ROM:   AROM eval  Flexion 70% pain bilat LB  Extension 10% pain R mid back   Right lateral flexion 65% pull L > R LB  Left lateral flexion 60% pull R LB  Right rotation 20% pain mid back  Left rotation 20% discomfort mid back    (Blank rows = not tested)  LOWER EXTREMITY ROM:     Active Assistive Right eval Left eval  Hip flexion    Hip extension 0 0  Hip abduction    Hip adduction    Hip internal rotation    Hip external rotation    Knee flexion 90 95  Knee extension -5 -5  Ankle dorsiflexion 0 0  Ankle plantarflexion    Ankle inversion    Ankle eversion     (Blank rows = not tested)  LOWER EXTREMITY MMT:    MMT Right eval  Right  02/22/23 Left eval   Hip flexion 5- 5 5 5   Hip extension 4- 4+ 5- 5-  Hip abduction 3+ 4 4+ 5  Hip adduction      Hip internal rotation      Hip external rotation      Knee flexion 5  5  Knee extension 5  5   Ankle dorsiflexion      Ankle plantarflexion      Ankle inversion      Ankle eversion       (Blank rows = not tested)  LUMBAR SPECIAL TESTS:  Straight leg raise test: Negative and Slump test: Negative  FUNCTIONAL TESTS:  SLS: R 2 sec ; L 1 sec  5 times sit to stand: 23.5 sec  GAIT: Distance walked: 40  Assistive device utilized: None Level of assistance: Complete Independence Comments: flexed forward at trunk and hips    [facility] Adult PT Treatment:                                                DATE: 03/03/2023 Therapeutic Exercise: Nustep L7 x7 min Sitting   Sit to stand high low table UE's crossed over chest x 10 Supine  gentle trunk rotation x 10  marching VC to engage core x 10 R/L  Sciatic nerve mobilization x 10 R/L  Bridge 5 sec hold x 10 Dead bug alternating U/LE's x 10 (VC to engage core) Alternate knee drop in hooklying feet apart dropping knee in x 10 R/L  Prone Prone lying pillow under chest x 2 min Standing  Isometric row blue TB 10 sec x 10  Partial lunge UE support on counter x 10 R/L SLS ~ 20 sec x 2 R/L (VC to engage core) Manual:   Manual Therapy: Passive R knee flexion/rotation (prone)  Skilled palpation to assess response to manual work and DN PA mobs lumbar spine Grade II  Trigger Point Dry-Needling  Treatment instructions: Expect mild to moderate muscle soreness. Patient verbalized understanding of these instructions and education. Patient Consent Given: Yes Education handout provided: Previously provided Muscles treated: R/L QL and lumbar paraspinals L4/5    Electrical stimulation performed: Yes Parameters: mAmp current intensity to patient tolerance  Treatment response/outcome: decreased palpable tightness     [facility]  Adult PT Treatment:                                                DATE: 03/01/2023 Therapeutic Exercise: Nustep L6 x 8 min Sitting  Trial of anterior posterior pelvic tilt x 10- pt reports some pain in L LB area Sit to stand high low table UE's crossed over chest x 15 Supine  gentle trunk rotation x 10  marching VC to engage core x 10 R/L  Sciatic nerve mobilization x 10 R/L  Supine piriformis stretch travell 30 sec x 2 R/L (VC to avoid twisting) Bridge 5 sec hold x 10 Dead bug alternating U/LE's x 10 (VC to engage core) Alternate knee drop in hooklying feet apart dropping knee in x 10 R/L  Prone Prone lying pillow under chest x 2 min Prone press ups through partial range x 10 Prone on elbows - with thoracic protraction/retraction x 10  Prone press with alt hip ext 2 x 10 ea Standing  Isometric row blue TB 10 sec x 10  Partial lunge UE support on counter x 10 R/L   [facility] Adult PT Treatment:  DATE: 02/22/2023 Therapeutic Exercise: Nustep L6 x 5 min Prone press ups x 10- abolishes L side pain  - 2nd set brings it back a little on L to 1-2/10 Prone on elbows - with thoracic protraction/retraction x 5  Prone press with alt hip ext 2 x 10 ea Bridge 2 x 10 Supine piriformis stretch travell 30 sec x 2 R/L (VC to avoid twisting) Sit to stand high low table UE's crossed over chest x 15 Partial lunge UE support on counter x 10 R/L Isometric row blue TB 10 sec x 10  Sports cord step back 10 sec x 10    Manual:  P/A mobs to L5/S1 and L4/5 grade   HOME EXERCISE PROGRAM: Access Code: [email or web address] URL: [email or web address] Date: 02/18/2023 Prepared by: Raynelle Fanning  Exercises - Isometric Dead Bug  - 2 x daily - 7 x weekly - 1 sets - 10 reps - 5-10 sec hold - Dead Bug  - 1 x daily - 7 x weekly - 3 sets - 10 reps - Supine Single Knee to Chest Stretch  - 1 x daily - 7 x weekly - 1 sets - 10 reps - 10-20 sec hold - Supine Lower Trunk  Rotation  - 1 x daily - 7 x weekly - 3 sets - 10 reps - Supine Hip External Rotation Stretch  - 1 x daily - 7 x weekly - 3 sets - 10 reps - Prone Hip Internal Rotation AROM  - 1 x daily - 7 x weekly - 3 sets - 10 reps - Quadruped Bent Leg Hip Extension  - 1 x daily - 7 x weekly - 3 sets - 10 reps - Squat with Chair Touch  - 1 x daily - 7 x weekly - 3 sets - 10 reps - Forward T with Counter Support  - 1 x daily - 7 x weekly - 3 sets - 10 reps - Standing Lumbar Extension at Wall - Forearms  - 4-5 x daily - 7 x weekly - 2 sets - 10 reps - Supine Bridge  - 2 x daily - 7 x weekly - 1-3 sets - 10 reps - Prone Press Up On Elbows  - 4 x daily - 7 x weekly - 1 sets - 1 reps - 1-5 min hold - Standing Quadratus Lumborum Mobilization with Small Ball on Wall  - 1 x daily - 3-4 x weekly - 1 sets - 10 reps - Prone Press Up  - 3-4 x daily - 7 x weekly - 2 sets - 10 reps - Split Squats Upright Trunk (Quad Bias)  - 1 x daily - 3-4 x weekly - 1-2 sets - 10 reps  ASSESSMENT:  CLINICAL IMPRESSION: Adhvik reports continued increase in back pain with pain into bilat hips to knees. He has continued to be active during the day. He did his extension exercises last night because they helped when he was here on Monday. Symptoms are worse today. Trial of dry needling today followed by exercises to address movement and stability. Encouraged patient to work on his home program and continue with regular walking program. Tolerated needling and exercises well, reporting decreased pain and demonstrating improved freedom of movement following exercises.    Eval: Patient is a 67 y.o. male who was seen today for physical therapy evaluation and treatment for  Spondylolisthesis at L5-S1 level and   Chronic bilateral low back pain with right-sided sciatica  .   OBJECTIVE IMPAIRMENTS: Abnormal gait, decreased activity tolerance, decreased endurance,  decreased mobility, decreased ROM, hypomobility, increased fascial restrictions,  increased muscle spasms, impaired flexibility, improper body mechanics, postural dysfunction, and pain.   GOALS: Goals reviewed with patient? Yes  SHORT TERM GOALS: Target date: 02/10/2023  Independent in initial HEP  Baseline: Goal status: MET  2.  Decrease LB and R LE pain by 25-50% allowing patient to return to normal activities  Baseline: R LE pain improved 80%, LBP pain no change Goal status: IN PROGRESS   LONG TERM GOALS: Target date: 03/24/2023  Increase hamstring flexibility to ~ 75 deg bilat  Baseline:  Goal status: INITIAL  2.  Trunk ROM/mobility WFL's and minimal to no pain  Baseline:  Goal status: INITIAL  3.  Increase strength R LE to 4+/5 to 5/5  Baseline:  Goal status: INITIAL  4.  Patient reports return to regular activities including regular exercise - yoga and water exercises Baseline:  Goal status: INITIAL  5.  Independent in HEP including aquatic program as indicated  Baseline:  Goal status: INITIAL  6.  Improve functional limitation score to 48 Baseline:  Goal status: INITIAL  PLAN:  PT FREQUENCY: 2x/week  PT DURATION: 12 weeks  PLANNED INTERVENTIONS: Therapeutic exercises, Therapeutic activity, Neuromuscular re-education, Balance training, Gait training, Patient/Family education, Self Care, Joint mobilization, Aquatic Therapy, Dry Needling, Electrical stimulation, Spinal mobilization, Cryotherapy, Moist heat, Taping, Traction, Ultrasound, Ionotophoresis 4mg /ml Dexamethasone, Manual therapy, and Re-evaluation.  PLAN FOR NEXT SESSION: Progress exercises; work on functional LE strengthening, leg press, lunges; extension biased activities. continue efforts to educate patient re back care and body mechanics; manual work, DN, modalities as indicated.    Antonin Meininger P. Leonor Liv PT, MPH 03/03/23 10:10 AM

## 2023-03-08 ENCOUNTER — Ambulatory Visit
Payer: Commercial Managed Care - PPO | Attending: Family Medicine | Admitting: Rehabilitative and Restorative Service Providers"

## 2023-03-08 ENCOUNTER — Encounter: Payer: Self-pay | Admitting: Rehabilitative and Restorative Service Providers"

## 2023-03-08 DIAGNOSIS — M6281 Muscle weakness (generalized): Secondary | ICD-10-CM | POA: Insufficient documentation

## 2023-03-08 DIAGNOSIS — M4317 Spondylolisthesis, lumbosacral region: Secondary | ICD-10-CM | POA: Insufficient documentation

## 2023-03-08 DIAGNOSIS — G8929 Other chronic pain: Secondary | ICD-10-CM | POA: Diagnosis not present

## 2023-03-08 DIAGNOSIS — H8111 Benign paroxysmal vertigo, right ear: Secondary | ICD-10-CM | POA: Insufficient documentation

## 2023-03-08 DIAGNOSIS — R2689 Other abnormalities of gait and mobility: Secondary | ICD-10-CM | POA: Insufficient documentation

## 2023-03-08 DIAGNOSIS — R29898 Other symptoms and signs involving the musculoskeletal system: Secondary | ICD-10-CM | POA: Insufficient documentation

## 2023-03-08 DIAGNOSIS — M5441 Lumbago with sciatica, right side: Secondary | ICD-10-CM | POA: Insufficient documentation

## 2023-03-08 DIAGNOSIS — R42 Dizziness and giddiness: Secondary | ICD-10-CM | POA: Insufficient documentation

## 2023-03-08 NOTE — Therapy (Signed)
OUTPATIENT PHYSICAL THERAPY THORACOLUMBAR TREATMENT   Patient Name: Reginald Rios MRN: [id number] DOB:1956-03-06, 67 y.o., male Today's Date: 03/08/2023  END OF SESSION:  PT End of Session - 03/08/23 1503     Visit Number 16    Number of Visits 24    Date for PT Re-Evaluation 03/24/23    Authorization Type [employer] $20 copay    Progress Note Due on Visit 20    PT Start Time 1500    PT Stop Time 1545    PT Time Calculation (min) 45 min    Activity Tolerance Patient tolerated treatment well              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   BPPV (benign paroxysmal positional vertigo) 08/14/2014   CAD (coronary artery disease)    2/19 PCI/DESx1 to Lcx   Cervical disc disorder with radiculopathy of cervical region 10/07/2015   Cervical radicular pain 01/10/2019   Chronic cough 04/13/2019   Decreased cardiac ejection fraction 04/02/2015   Diabetes mellitus without complication (HCC)    Dyspnea on exertion 05/15/2015   Erectile dysfunction 07/11/2019   Exercise-induced asthma    Family history of early CAD 07/05/2017   Fatigue 05/15/2015   Hallux rigidus, right foot 09/15/2021   Heart murmur    Hemarthrosis of right elbow 02/03/2016   Hoarseness 04/13/2019   Hyperlipidemia    family hx of high cholesterol   Hypogonadism in male    Incomplete rotator cuff tear 09/10/2015   Injected 09/10/2015   Lateral epicondylitis of right elbow 07/28/2019   Low back pain at multiple sites 02/05/2021   Low vitamin D level 04/22/2017   Mild persistent asthma/cough variant  asthma 04/13/2019   Myofascial pain syndrome, cervical 07/28/2019   Nonischemic cardiomyopathy (HCC) 05/15/2015   Nontraumatic incomplete tear of right rotator cuff 07/25/2019   Onychomycosis 05/25/2018   Osteoarthritis    Pain in right ankle and joints of right foot 10/05/2016   Palpitations 07/06/2017   Shoulder bursitis 10/12/2014   Injected in 10/12/2014  Repeat 01/21/2015   Small thenar eminence 02/03/2016   Spinal stenosis, lumbar region, without neurogenic claudication 10/12/2014   Spondylolisthesis at L5-S1 level 05/09/2021   Strain of latissimus dorsi muscle 01/21/2015   Tendinopathy of right biceps tendon 07/25/2019   Type 2 diabetes mellitus with hyperglycemia, without long-term current use of insulin (HCC) 12/22/2013   Ulnar neuropathy at elbow of right upper extremity 01/03/2019   Past Surgical History:  Procedure Laterality Date   CORONARY PRESSURE/FFR STUDY N/A 06/23/2017   Procedure: INTRAVASCULAR PRESSURE WIRE/FFR STUDY;  Surgeon: Marykay Lex, MD;  Location: [facility] INVASIVE CV LAB;  Service: Cardiovascular;  Laterality: N/A;   CORONARY STENT INTERVENTION N/A 06/23/2017   Procedure: CORONARY STENT INTERVENTION;  Surgeon: Marykay Lex, MD;  Location: [facility] INVASIVE CV LAB;  Service: Cardiovascular;  Laterality: N/A;   KNEE ARTHROSCOPY     LEFT HEART CATH AND CORONARY ANGIOGRAPHY N/A 06/23/2017   Procedure: LEFT HEART CATH AND CORONARY ANGIOGRAPHY;  Surgeon: Marykay Lex, MD;  Location: [facility] INVASIVE CV LAB;  Service: Cardiovascular;  Laterality: N/A;   SHOULDER ARTHROSCOPY WITH BICEPSTENOTOMY Right 09/06/2019   Procedure: SHOULDER ARTHROSCOPY WITH BICEPSTENOTOMY;  Surgeon: Tarry Kos, MD;  Location: [facility];  Service: Orthopedics;  Laterality: Right;   SHOULDER ARTHROSCOPY WITH SUBACROMIAL DECOMPRESSION Right 09/06/2019   Procedure: RIGHT SHOULDER ARTHROSCOPY WITH EXTENSIVE DEBRIDEMENT, SUBACROMIAL DECOMPRESSION, BICEPS TENOTOMY;  Surgeon: Tarry Kos,  MD;  Location: [facility];  Service: Orthopedics;  Laterality: Right;   TENOTOMY ACHILLES TENDON     Patient Active Problem List   Diagnosis Date Noted   Hallux rigidus, right foot 09/15/2021   Spondylolisthesis at L5-S1 level 05/09/2021   Low back pain at multiple sites 02/05/2021   Osteoarthritis    Heart murmur    Exercise-induced asthma    Diabetes mellitus without complication (HCC)    Arthritis    Anemia    Myofascial pain syndrome, cervical 07/28/2019   Lateral epicondylitis of right elbow 07/28/2019   Nontraumatic incomplete tear of right rotator cuff 07/25/2019   Tendinopathy of right biceps tendon 07/25/2019   Erectile dysfunction 07/11/2019   Mild persistent asthma/cough variant asthma 04/13/2019   Allergic rhinitis with a possible nonallergic component 04/13/2019   Acid reflux 04/13/2019   Chronic cough 04/13/2019   Hoarseness 04/13/2019   Cervical radicular pain 01/10/2019   Ulnar neuropathy at elbow of right upper extremity 01/03/2019   Acute bilateral low back pain without sciatica 01/03/2019   Onychomycosis 05/25/2018   Palpitations 07/06/2017   CAD (coronary artery disease) 07/05/2017   Family history of early CAD 07/05/2017   Abnormal findings on diagnostic imaging of cardiovascular system 06/23/2017   Low vitamin D level 04/22/2017   Pain in right ankle and joints of right foot 10/05/2016   Hemarthrosis of right elbow 02/03/2016   Small thenar eminence 02/03/2016   Cervical disc disorder with radiculopathy of cervical region 10/07/2015   Incomplete rotator cuff tear 09/10/2015   Nonischemic cardiomyopathy (HCC) 05/15/2015   Fatigue 05/15/2015   Dyspnea on exertion 05/15/2015   Decreased cardiac ejection fraction 04/02/2015   Strain of latissimus dorsi muscle 01/21/2015   Shoulder bursitis 10/12/2014   Spinal stenosis, lumbar region, without neurogenic claudication 10/12/2014   BPPV (benign paroxysmal positional vertigo) 08/14/2014    Hyperlipidemia 01/19/2014   Anemia, unspecified 01/19/2014   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   Type 2 diabetes mellitus with hyperglycemia, without long-term current use of insulin (HCC) 12/22/2013   Hypogonadism in male 12/22/2013   Arthritis of hand, left 12/22/2013    PCP: Dr Arva Chafe, DO  REFERRING PROVIDER: Dr Rodolph Bong   REFERRING DIAG:  Spondylolisthesis at L5-S1 level  Chronic bilateral low back pain with right-sided sciatica    Rationale for Evaluation and Treatment: Rehabilitation  THERAPY DIAG:  Spondylolisthesis at L5-S1 level  Chronic bilateral low back pain with right-sided sciatica  Other symptoms and signs involving the musculoskeletal system  Muscle weakness (generalized)  ONSET DATE: 12/12/22  SUBJECTIVE:  SUBJECTIVE STATEMENT:  Patient reports that pain is decreased in the past few days. Not bad today. Has been doing his exercises and can tell they are helping. Still has stiffness in the morning. He has not had pain shooting down his legs "wrapping around" his knees. Walking his dog twice a day for 20 min. Had an injection last Wednesday, 02/24/23. The injection did not change the back pain but it felt he felt looser. Has been sitting in a recliner at home. Discussed that he needs to avoid sitting in a recliner which increases back pain but he continues to sit in the recliner. He was encouraged to continue a walking program level surfaces, increasing to 30 min twice a day.   Patient reports that he has been having trouble with dizziness again which he has had in the past. He would like to have this evaluated by PT.    PERTINENT HISTORY:  Chronic LBP; bilat hamstring tightness; bilat knees; cervical disc surgery; AODM   PAIN:  Are  you having pain? Yes: NPRS scale: 0/10 back 2/10 hips Pain location: LB; bilat leg Pain description: dull aching in the LB; not as sharp more diffuse Aggravating factors: back - sitting > 10 min; leg - turning foot/twisting  Relieving factors: meds   PRECAUTIONS: None    WEIGHT BEARING RESTRICTIONS: No  FALLS:  Has patient fallen in last 6 months? No  LIVING ENVIRONMENT: Lives with: lives with their spouse Lives in: House/apartment Stairs: Yes: Internal: 24 steps; on right going up and External: 3 steps; on right going up Has following equipment at home: None  OCCUPATION: retired [employer] officer, [employer]; active playing pickle ball    PATIENT GOALS: stop most of the pain; get back to activities   NEXT MD VISIT: 02/09/23  OBJECTIVE:   DIAGNOSTIC FINDINGS:  Xray 12/29/22 - results unavailable   PATIENT SURVEYS:  FOTO 31; goals 48    SENSATION: WFL  MUSCLE LENGTH: Hamstrings: Right 70 deg; Left 65 deg Tight hip flexors bilat   POSTURE: rounded shoulders, forward head, decreased lumbar lordosis, increased thoracic kyphosis, flexed trunk , and weight shift right  PALPATION: Pain with PA mobs lumbar spine; R lumbar paraspinals; posterior hip in gluts and piriformis; hamstrings   LUMBAR ROM:   AROM eval  Flexion 70% pain bilat LB  Extension 10% pain R mid back   Right lateral flexion 65% pull L > R LB  Left lateral flexion 60% pull R LB  Right rotation 20% pain mid back  Left rotation 20% discomfort mid back    (Blank rows = not tested)  LOWER EXTREMITY ROM:     Active Assistive Right eval Left eval  Hip flexion    Hip extension 0 0  Hip abduction    Hip adduction    Hip internal rotation    Hip external rotation    Knee flexion 90 95  Knee extension -5 -5  Ankle dorsiflexion 0 0  Ankle plantarflexion    Ankle inversion    Ankle eversion     (Blank rows = not tested)  LOWER EXTREMITY MMT:    MMT Right eval Right  02/22/23 Left eval Left  02/22/23  Hip  flexion 5- 5 5 5   Hip extension 4- 4+ 5- 5-  Hip abduction 3+ 4 4+ 5  Hip adduction      Hip internal rotation      Hip external rotation      Knee flexion 5  5   Knee extension  5  5   Ankle dorsiflexion      Ankle plantarflexion      Ankle inversion      Ankle eversion       (Blank rows = not tested)  LUMBAR SPECIAL TESTS:  Straight leg raise test: Negative and Slump test: Negative  FUNCTIONAL TESTS:  SLS: R 2 sec ; L 1 sec  5 times sit to stand: 23.5 sec  GAIT: Distance walked: 40  Assistive device utilized: None Level of assistance: Complete Independence Comments: flexed forward at trunk and hips    [facility] Adult PT Treatment:                                                DATE: 03/08/2023 Therapeutic Exercise: Nustep L7 x7 min Sitting   Sit to stand high low table UE's crossed over chest x 10 Supine  gentle trunk rotation x 10  Bridge with ball btn knees 5 sec hold x 10 Dead bug alternating U/LE's x 10 (VC to engage core) Hip abduction in hooklying green TB alternating LE's 3 sec x 10 R/L  Trunk rotation 5-10 sec x 8 R/L  Alternate knee drop in hooklying feet apart dropping knee in x 10 R/L  Single knee to chest 10 sec x 5 R/L   Sciatic nerve mobilization x 10 R/L Prone Prone prop lying pillow under chest x 2 min Prone partial press up  Stretch for ER bilat hips ~ 45 sec  Standing  Side steps R/L x 10 green TB above knees  Isometric row blue TB 10 sec x 10  Partial lunge UE support on counter x 5 R/L SLS ~ 20 sec x 2 R/L (VC to engage core) Modalities:   Moist heat lumbar spine x 10 min    [facility] Adult PT Treatment:                                                DATE: 03/03/2023 Therapeutic Exercise: Nustep L7 x7 min Sitting   Sit to stand high low table UE's crossed over chest x 10 Supine  gentle trunk rotation x 10  marching VC to engage core x 10 R/L  Sciatic nerve mobilization x 10 R/L  Bridge 5 sec hold x 10 Dead bug alternating U/LE's x 10 (VC to  engage core) Alternate knee drop in hooklying feet apart dropping knee in x 10 R/L  Prone Prone lying pillow under chest x 2 min Standing  Isometric row blue TB 10 sec x 10  Partial lunge UE support on counter x 10 R/L SLS ~ 20 sec x 2 R/L (VC to engage core) Manual:   Manual Therapy: Passive R knee flexion/rotation (prone)  Skilled palpation to assess response to manual work and DN PA mobs lumbar spine Grade II  Trigger Point Dry-Needling  Treatment instructions: Expect mild to moderate muscle soreness. Patient verbalized understanding of these instructions and education. Patient Consent Given: Yes Education handout provided: Previously provided Muscles treated: R/L QL and lumbar paraspinals L4/5    Electrical stimulation performed: Yes Parameters: mAmp current intensity to patient tolerance  Treatment response/outcome: decreased palpable tightness     [facility] Adult PT Treatment:  DATE: 03/01/2023 Therapeutic Exercise: Nustep L6 x 8 min Sitting  Trial of anterior posterior pelvic tilt x 10- pt reports some pain in L LB area Sit to stand high low table UE's crossed over chest x 15 Supine  gentle trunk rotation x 10  marching VC to engage core x 10 R/L  Sciatic nerve mobilization x 10 R/L  Supine piriformis stretch travell 30 sec x 2 R/L (VC to avoid twisting) Bridge 5 sec hold x 10 Dead bug alternating U/LE's x 10 (VC to engage core) Alternate knee drop in hooklying feet apart dropping knee in x 10 R/L  Prone Prone lying pillow under chest x 2 min Prone press ups through partial range x 10 Prone on elbows - with thoracic protraction/retraction x 10  Prone press with alt hip ext 2 x 10 ea Standing  Isometric row blue TB 10 sec x 10  Partial lunge UE support on counter x 10 R/L   HOME EXERCISE PROGRAM: Access Code: [email or web address] URL: [email or web address] Date: 02/18/2023 Prepared by: Raynelle Fanning  Exercises - Isometric  Dead Bug  - 2 x daily - 7 x weekly - 1 sets - 10 reps - 5-10 sec hold - Dead Bug  - 1 x daily - 7 x weekly - 3 sets - 10 reps - Supine Single Knee to Chest Stretch  - 1 x daily - 7 x weekly - 1 sets - 10 reps - 10-20 sec hold - Supine Lower Trunk Rotation  - 1 x daily - 7 x weekly - 3 sets - 10 reps - Supine Hip External Rotation Stretch  - 1 x daily - 7 x weekly - 3 sets - 10 reps - Prone Hip Internal Rotation AROM  - 1 x daily - 7 x weekly - 3 sets - 10 reps - Quadruped Bent Leg Hip Extension  - 1 x daily - 7 x weekly - 3 sets - 10 reps - Squat with Chair Touch  - 1 x daily - 7 x weekly - 3 sets - 10 reps - Forward T with Counter Support  - 1 x daily - 7 x weekly - 3 sets - 10 reps - Standing Lumbar Extension at Wall - Forearms  - 4-5 x daily - 7 x weekly - 2 sets - 10 reps - Supine Bridge  - 2 x daily - 7 x weekly - 1-3 sets - 10 reps - Prone Press Up On Elbows  - 4 x daily - 7 x weekly - 1 sets - 1 reps - 1-5 min hold - Standing Quadratus Lumborum Mobilization with Small Ball on Wall  - 1 x daily - 3-4 x weekly - 1 sets - 10 reps - Prone Press Up  - 3-4 x daily - 7 x weekly - 2 sets - 10 reps - Split Squats Upright Trunk (Quad Bias)  - 1 x daily - 3-4 x weekly - 1-2 sets - 10 reps  ASSESSMENT:  CLINICAL IMPRESSION: Filipe reports decreased back pain with no pain in the back and less pain into bilat hips to knees. He has been doing his exercises with positive results. Added some core and LE strengthening in clinic today. He has continued to be active during the day. He is walking his dog twice a day for ~ 29 min. Encouraged patient to work on his home program and continue with regular walking program, gradually increasing his walking.    Eval: Patient is a 67 y.o. male who was seen today for  physical therapy evaluation and treatment for  Spondylolisthesis at L5-S1 level and   Chronic bilateral low back pain with right-sided sciatica  .   OBJECTIVE IMPAIRMENTS: Abnormal gait, decreased  activity tolerance, decreased endurance, decreased mobility, decreased ROM, hypomobility, increased fascial restrictions, increased muscle spasms, impaired flexibility, improper body mechanics, postural dysfunction, and pain.   GOALS: Goals reviewed with patient? Yes  SHORT TERM GOALS: Target date: 02/10/2023  Independent in initial HEP  Baseline: Goal status: MET  2.  Decrease LB and R LE pain by 25-50% allowing patient to return to normal activities  Baseline: R LE pain improved 80%, LBP pain no change Goal status: IN PROGRESS   LONG TERM GOALS: Target date: 03/24/2023  Increase hamstring flexibility to ~ 75 deg bilat  Baseline:  Goal status: INITIAL  2.  Trunk ROM/mobility WFL's and minimal to no pain  Baseline:  Goal status: INITIAL  3.  Increase strength R LE to 4+/5 to 5/5  Baseline:  Goal status: INITIAL  4.  Patient reports return to regular activities including regular exercise - yoga and water exercises Baseline:  Goal status: INITIAL  5.  Independent in HEP including aquatic program as indicated  Baseline:  Goal status: INITIAL  6.  Improve functional limitation score to 48 Baseline:  Goal status: INITIAL  PLAN:  PT FREQUENCY: 2x/week  PT DURATION: 12 weeks  PLANNED INTERVENTIONS: Therapeutic exercises, Therapeutic activity, Neuromuscular re-education, Balance training, Gait training, Patient/Family education, Self Care, Joint mobilization, Aquatic Therapy, Dry Needling, Electrical stimulation, Spinal mobilization, Cryotherapy, Moist heat, Taping, Traction, Ultrasound, Ionotophoresis 4mg /ml Dexamethasone, Manual therapy, and Re-evaluation.  PLAN FOR NEXT SESSION: Progress exercises; work on functional LE strengthening, leg press, lunges; extension biased activities. continue efforts to educate patient re back care and body mechanics; manual work, DN, modalities as indicated. Message Dr [name] requesting vestibular evaluation.    Kalany Diekmann P. Leonor Liv PT,  MPH 03/08/23 3:04 PM

## 2023-03-09 ENCOUNTER — Encounter: Payer: Self-pay | Admitting: Family Medicine

## 2023-03-09 ENCOUNTER — Other Ambulatory Visit: Payer: Self-pay

## 2023-03-10 ENCOUNTER — Other Ambulatory Visit (HOSPITAL_COMMUNITY): Payer: Self-pay

## 2023-03-11 ENCOUNTER — Ambulatory Visit: Payer: Commercial Managed Care - PPO | Admitting: Rehabilitative and Restorative Service Providers"

## 2023-03-11 ENCOUNTER — Encounter: Payer: Self-pay | Admitting: Rehabilitative and Restorative Service Providers"

## 2023-03-11 DIAGNOSIS — M4317 Spondylolisthesis, lumbosacral region: Secondary | ICD-10-CM | POA: Diagnosis not present

## 2023-03-11 DIAGNOSIS — R29898 Other symptoms and signs involving the musculoskeletal system: Secondary | ICD-10-CM | POA: Diagnosis not present

## 2023-03-11 DIAGNOSIS — R42 Dizziness and giddiness: Secondary | ICD-10-CM | POA: Diagnosis not present

## 2023-03-11 DIAGNOSIS — G8929 Other chronic pain: Secondary | ICD-10-CM | POA: Diagnosis not present

## 2023-03-11 DIAGNOSIS — R2689 Other abnormalities of gait and mobility: Secondary | ICD-10-CM | POA: Diagnosis not present

## 2023-03-11 DIAGNOSIS — M5441 Lumbago with sciatica, right side: Secondary | ICD-10-CM | POA: Diagnosis not present

## 2023-03-11 DIAGNOSIS — M6281 Muscle weakness (generalized): Secondary | ICD-10-CM | POA: Diagnosis not present

## 2023-03-11 DIAGNOSIS — H8111 Benign paroxysmal vertigo, right ear: Secondary | ICD-10-CM | POA: Diagnosis not present

## 2023-03-11 NOTE — Therapy (Signed)
OUTPATIENT PHYSICAL THERAPY THORACOLUMBAR TREATMENT   Patient Name: Reginald Rios MRN: [id number] DOB:Jul 26, 1955, 67 y.o., male Today's Date: 03/11/2023  END OF SESSION:  PT End of Session - 03/11/23 1535     Visit Number 17    Number of Visits 24    Date for PT Re-Evaluation 03/24/23    Authorization Type [employer] $20 copay    Progress Note Due on Visit 20    PT Start Time 1530    PT Stop Time 1623    PT Time Calculation (min) 53 min    Activity Tolerance Patient tolerated treatment well              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   BPPV (benign paroxysmal positional vertigo) 08/14/2014   CAD (coronary artery disease)    2/19 PCI/DESx1 to Lcx   Cervical disc disorder with radiculopathy of cervical region 10/07/2015   Cervical radicular pain 01/10/2019   Chronic cough 04/13/2019   Decreased cardiac ejection fraction 04/02/2015   Diabetes mellitus without complication (HCC)    Dyspnea on exertion 05/15/2015   Erectile dysfunction 07/11/2019   Exercise-induced asthma    Family history of early CAD 07/05/2017   Fatigue 05/15/2015   Hallux rigidus, right foot 09/15/2021   Heart murmur    Hemarthrosis of right elbow 02/03/2016   Hoarseness 04/13/2019   Hyperlipidemia    family hx of high cholesterol   Hypogonadism in male    Incomplete rotator cuff tear 09/10/2015   Injected 09/10/2015   Lateral epicondylitis of right elbow 07/28/2019   Low back pain at multiple sites 02/05/2021   Low vitamin D level 04/22/2017   Mild persistent asthma/cough variant  asthma 04/13/2019   Myofascial pain syndrome, cervical 07/28/2019   Nonischemic cardiomyopathy (HCC) 05/15/2015   Nontraumatic incomplete tear of right rotator cuff 07/25/2019   Onychomycosis 05/25/2018   Osteoarthritis    Pain in right ankle and joints of right foot 10/05/2016   Palpitations 07/06/2017   Shoulder bursitis 10/12/2014   Injected in 10/12/2014  Repeat 01/21/2015   Small thenar eminence 02/03/2016   Spinal stenosis, lumbar region, without neurogenic claudication 10/12/2014   Spondylolisthesis at L5-S1 level 05/09/2021   Strain of latissimus dorsi muscle 01/21/2015   Tendinopathy of right biceps tendon 07/25/2019   Type 2 diabetes mellitus with hyperglycemia, without long-term current use of insulin (HCC) 12/22/2013   Ulnar neuropathy at elbow of right upper extremity 01/03/2019   Past Surgical History:  Procedure Laterality Date   CORONARY PRESSURE/FFR STUDY N/A 06/23/2017   Procedure: INTRAVASCULAR PRESSURE WIRE/FFR STUDY;  Surgeon: Marykay Lex, MD;  Location: [facility] INVASIVE CV LAB;  Service: Cardiovascular;  Laterality: N/A;   CORONARY STENT INTERVENTION N/A 06/23/2017   Procedure: CORONARY STENT INTERVENTION;  Surgeon: Marykay Lex, MD;  Location: [facility] INVASIVE CV LAB;  Service: Cardiovascular;  Laterality: N/A;   KNEE ARTHROSCOPY     LEFT HEART CATH AND CORONARY ANGIOGRAPHY N/A 06/23/2017   Procedure: LEFT HEART CATH AND CORONARY ANGIOGRAPHY;  Surgeon: Marykay Lex, MD;  Location: [facility] INVASIVE CV LAB;  Service: Cardiovascular;  Laterality: N/A;   SHOULDER ARTHROSCOPY WITH BICEPSTENOTOMY Right 09/06/2019   Procedure: SHOULDER ARTHROSCOPY WITH BICEPSTENOTOMY;  Surgeon: Tarry Kos, MD;  Location: [facility];  Service: Orthopedics;  Laterality: Right;   SHOULDER ARTHROSCOPY WITH SUBACROMIAL DECOMPRESSION Right 09/06/2019   Procedure: RIGHT SHOULDER ARTHROSCOPY WITH EXTENSIVE DEBRIDEMENT, SUBACROMIAL DECOMPRESSION, BICEPS TENOTOMY;  Surgeon: Tarry Kos,  MD;  Location: [facility];  Service: Orthopedics;  Laterality: Right;   TENOTOMY ACHILLES TENDON     Patient Active Problem List   Diagnosis Date Noted   Hallux rigidus, right foot 09/15/2021   Spondylolisthesis at L5-S1 level 05/09/2021   Low back pain at multiple sites 02/05/2021   Osteoarthritis    Heart murmur    Exercise-induced asthma    Diabetes mellitus without complication (HCC)    Arthritis    Anemia    Myofascial pain syndrome, cervical 07/28/2019   Lateral epicondylitis of right elbow 07/28/2019   Nontraumatic incomplete tear of right rotator cuff 07/25/2019   Tendinopathy of right biceps tendon 07/25/2019   Erectile dysfunction 07/11/2019   Mild persistent asthma/cough variant asthma 04/13/2019   Allergic rhinitis with a possible nonallergic component 04/13/2019   Acid reflux 04/13/2019   Chronic cough 04/13/2019   Hoarseness 04/13/2019   Cervical radicular pain 01/10/2019   Ulnar neuropathy at elbow of right upper extremity 01/03/2019   Acute bilateral low back pain without sciatica 01/03/2019   Onychomycosis 05/25/2018   Palpitations 07/06/2017   CAD (coronary artery disease) 07/05/2017   Family history of early CAD 07/05/2017   Abnormal findings on diagnostic imaging of cardiovascular system 06/23/2017   Low vitamin D level 04/22/2017   Pain in right ankle and joints of right foot 10/05/2016   Hemarthrosis of right elbow 02/03/2016   Small thenar eminence 02/03/2016   Cervical disc disorder with radiculopathy of cervical region 10/07/2015   Incomplete rotator cuff tear 09/10/2015   Nonischemic cardiomyopathy (HCC) 05/15/2015   Fatigue 05/15/2015   Dyspnea on exertion 05/15/2015   Decreased cardiac ejection fraction 04/02/2015   Strain of latissimus dorsi muscle 01/21/2015   Shoulder bursitis 10/12/2014   Spinal stenosis, lumbar region, without neurogenic claudication 10/12/2014   BPPV (benign paroxysmal positional vertigo) 08/14/2014    Hyperlipidemia 01/19/2014   Anemia, unspecified 01/19/2014   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   Type 2 diabetes mellitus with hyperglycemia, without long-term current use of insulin (HCC) 12/22/2013   Hypogonadism in male 12/22/2013   Arthritis of hand, left 12/22/2013    PCP: Dr Arva Chafe, DO  REFERRING PROVIDER: Dr Rodolph Bong   REFERRING DIAG:  Spondylolisthesis at L5-S1 level  Chronic bilateral low back pain with right-sided sciatica    Rationale for Evaluation and Treatment: Rehabilitation  THERAPY DIAG:  Spondylolisthesis at L5-S1 level  Chronic bilateral low back pain with right-sided sciatica  Other symptoms and signs involving the musculoskeletal system  Muscle weakness (generalized)  ONSET DATE: 12/12/22  SUBJECTIVE:  SUBJECTIVE STATEMENT:  Patient reports that pain continues to improve. He has less pain in the back and hips. Has been doing his exercises and can tell they are helping. Still has stiffness in the morning. He has not had pain shooting down his legs "wrapping around" his knees. Walking his dog twice a day now walking 31 minutes. Had an injection Wednesday, 02/24/23. Has been sitting in a recliner at home "a little bit". Discussed that he needs to avoid sitting in a recliner which increases back pain but he continues to sit in the recliner. He was encouraged to continue a walking program level surfaces and he has increased to 30 min twice a day. 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   Patient reports that he has been having trouble with dizziness again which he has had in the past. He would like to have this evaluated by PT.    PERTINENT HISTORY:  Chronic LBP; bilat hamstring tightness; bilat knees; cervical disc surgery;  AODM   PAIN:  Are you having pain? Yes: NPRS scale: 2/10 back 2-3/10 R hip Pain location: LB; bilat leg Pain description: dull aching in the LB; not as sharp more diffuse Aggravating factors: back - sitting > 10 min; leg - turning foot/twisting  Relieving factors: meds   PRECAUTIONS: None    WEIGHT BEARING RESTRICTIONS: No  FALLS:  Has patient fallen in last 6 months? No  LIVING ENVIRONMENT: Lives with: lives with their spouse Lives in: House/apartment Stairs: Yes: Internal: 24 steps; on right going up and External: 3 steps; on right going up Has following equipment at home: None  OCCUPATION: retired [employer] officer, [employer]; active playing pickle ball    PATIENT GOALS: stop most of the pain; get back to activities   NEXT MD VISIT: 02/09/23  OBJECTIVE:   DIAGNOSTIC FINDINGS:  Xray 12/29/22 - results unavailable   PATIENT SURVEYS:  FOTO 31; goals 48    SENSATION: WFL  MUSCLE LENGTH: Hamstrings: Right 70 deg; Left 65 deg Tight hip flexors bilat   POSTURE: rounded shoulders, forward head, decreased lumbar lordosis, increased thoracic kyphosis, flexed trunk , and weight shift right  PALPATION: Pain with PA mobs lumbar spine; R lumbar paraspinals; posterior hip in gluts and piriformis; hamstrings   LUMBAR ROM:   AROM eval  Flexion 70% pain bilat LB  Extension 10% pain R mid back   Right lateral flexion 65% pull L > R LB  Left lateral flexion 60% pull R LB  Right rotation 20% pain mid back  Left rotation 20% discomfort mid back    (Blank rows = not tested)  LOWER EXTREMITY ROM:     Active Assistive Right eval Left eval  Hip flexion    Hip extension 0 0  Hip abduction    Hip adduction    Hip internal rotation    Hip external rotation    Knee flexion 90 95  Knee extension -5 -5  Ankle dorsiflexion 0 0  Ankle plantarflexion    Ankle inversion    Ankle eversion     (Blank rows = not tested)  LOWER EXTREMITY MMT:    MMT Right eval Right  02/22/23 Left eval  Left  02/22/23  Hip flexion 5- 5 5 5   Hip extension 4- 4+ 5- 5-  Hip abduction 3+ 4 4+ 5  Hip adduction      Hip internal rotation      Hip external rotation      Knee flexion 5  5  Knee extension 5  5   Ankle dorsiflexion      Ankle plantarflexion      Ankle inversion      Ankle eversion       (Blank rows = not tested)  LUMBAR SPECIAL TESTS:  Straight leg raise test: Negative and Slump test: Negative  FUNCTIONAL TESTS:  SLS: R 2 sec ; L 1 sec  5 times sit to stand: 23.5 sec  GAIT: Distance walked: 40  Assistive device utilized: None Level of assistance: Complete Independence Comments: flexed forward at trunk and hips    [facility] Adult PT Treatment:                                                DATE: 03/11/2023 Therapeutic Exercise: Nustep L7 x10 min Sitting   Sit to stand high low table UE's crossed over chest x 10 Supine  Hamstring stretch 30 sec x 2 R/L  Trunk rotation 5-10 sec x 8 R/L Bridge with ball btn knees 5 sec hold x 10 Hip abduction in hooklying green TB alternating LE's 3 sec x 10 R/L   Alternate knee drop in hooklying feet apart dropping knee in x 10 R/L  Single knee to chest 10 sec x 3 R/L   Sciatic nerve mobilization x 10 R/L Prone Prone prop lying pillow under chest x 2 min Prone partial press up x 10 Stretch for ER bilat hips ~ 45 sec  Standing  Side steps green TB above knees x 15 ft x 4 Cable pull step back 15# TB 10 sec x 10  Partial lunge UE support on counter x 10 R/L SLS ~ 20 sec x 2 R/L   Modalities:   Moist heat lumbar spine x 10 min    [facility] Adult PT Treatment:                                                DATE: 03/08/2023 Therapeutic Exercise: Nustep L7 x7 min Sitting   Sit to stand high low table UE's crossed over chest x 10 Supine  gentle trunk rotation x 10  Bridge with ball btn knees 5 sec hold x 10 Dead bug alternating U/LE's x 10 (VC to engage core) Hip abduction in hooklying green TB alternating LE's 3 sec x 10 R/L   Trunk rotation 5-10 sec x 8 R/L  Alternate knee drop in hooklying feet apart dropping knee in x 10 R/L  Single knee to chest 10 sec x 5 R/L   Sciatic nerve mobilization x 10 R/L Prone Prone prop lying pillow under chest x 2 min Prone partial press up  Stretch for ER bilat hips ~ 45 sec  Standing  Side steps R/L x 10 green TB above knees  Isometric row blue TB 10 sec x 10  Partial lunge UE support on counter x 5 R/L SLS ~ 20 sec x 2 R/L (VC to engage core) Modalities:   Moist heat lumbar spine x 10 min    [facility] Adult PT Treatment:   (dry needling)                   DATE: 03/03/2023 Therapeutic Exercise: Nustep L7 x7 min Sitting   Sit to  stand high low table UE's crossed over chest x 10 Supine  gentle trunk rotation x 10  marching VC to engage core x 10 R/L  Sciatic nerve mobilization x 10 R/L  Bridge 5 sec hold x 10 Dead bug alternating U/LE's x 10 (VC to engage core) Alternate knee drop in hooklying feet apart dropping knee in x 10 R/L  Prone Prone lying pillow under chest x 2 min Standing  Isometric row blue TB 10 sec x 10  Partial lunge UE support on counter x 10 R/L SLS ~ 20 sec x 2 R/L (VC to engage core) Manual:   Manual Therapy: Passive R knee flexion/rotation (prone)  Skilled palpation to assess response to manual work and DN PA mobs lumbar spine Grade II  Trigger Point Dry-Needling  Treatment instructions: Expect mild to moderate muscle soreness. Patient verbalized understanding of these instructions and education. Patient Consent Given: Yes Education handout provided: Previously provided Muscles treated: R/L QL and lumbar paraspinals L4/5    Electrical stimulation performed: Yes Parameters: mAmp current intensity to patient tolerance  Treatment response/outcome: decreased palpable tightness     HOME EXERCISE PROGRAM: Access Code: [email or web address] URL: [email or web address] Date: 02/18/2023 Prepared by: Raynelle Fanning  Exercises - Isometric Dead Bug  -  2 x daily - 7 x weekly - 1 sets - 10 reps - 5-10 sec hold - Dead Bug  - 1 x daily - 7 x weekly - 3 sets - 10 reps - Supine Single Knee to Chest Stretch  - 1 x daily - 7 x weekly - 1 sets - 10 reps - 10-20 sec hold - Supine Lower Trunk Rotation  - 1 x daily - 7 x weekly - 3 sets - 10 reps - Supine Hip External Rotation Stretch  - 1 x daily - 7 x weekly - 3 sets - 10 reps - Prone Hip Internal Rotation AROM  - 1 x daily - 7 x weekly - 3 sets - 10 reps - Quadruped Bent Leg Hip Extension  - 1 x daily - 7 x weekly - 3 sets - 10 reps - Squat with Chair Touch  - 1 x daily - 7 x weekly - 3 sets - 10 reps - Forward T with Counter Support  - 1 x daily - 7 x weekly - 3 sets - 10 reps - Standing Lumbar Extension at Wall - Forearms  - 4-5 x daily - 7 x weekly - 2 sets - 10 reps - Supine Bridge  - 2 x daily - 7 x weekly - 1-3 sets - 10 reps - Prone Press Up On Elbows  - 4 x daily - 7 x weekly - 1 sets - 1 reps - 1-5 min hold - Standing Quadratus Lumborum Mobilization with Small Ball on Wall  - 1 x daily - 3-4 x weekly - 1 sets - 10 reps - Prone Press Up  - 3-4 x daily - 7 x weekly - 2 sets - 10 reps - Split Squats Upright Trunk (Quad Bias)  - 1 x daily - 3-4 x weekly - 1-2 sets - 10 reps  ASSESSMENT:  CLINICAL IMPRESSION: Reginald Rios reports continued decreased back pain with no pain in the back and less pain into bilat hips to knees. He has been doing his exercises with positive results. Continued core and LE strengthening. He has continued to be active during the day. He is walking his dog twice a day for ~ 31 min. Encouraged patient to work on his home program  and continue with regular walking program, gradually increasing his walking.    Eval: Patient is a 67 y.o. male who was seen today for physical therapy evaluation and treatment for  Spondylolisthesis at L5-S1 level and   Chronic bilateral low back pain with right-sided sciatica  .   OBJECTIVE IMPAIRMENTS: Abnormal gait, decreased activity tolerance,  decreased endurance, decreased mobility, decreased ROM, hypomobility, increased fascial restrictions, increased muscle spasms, impaired flexibility, improper body mechanics, postural dysfunction, and pain.   GOALS: Goals reviewed with patient? Yes  SHORT TERM GOALS: Target date: 02/10/2023  Independent in initial HEP  Baseline: Goal status: MET  2.  Decrease LB and R LE pain by 25-50% allowing patient to return to normal activities  Baseline: R LE pain improved 80%, LBP pain no change Goal status: IN PROGRESS   LONG TERM GOALS: Target date: 03/24/2023  Increase hamstring flexibility to ~ 75 deg bilat  Baseline:  Goal status: INITIAL  2.  Trunk ROM/mobility WFL's and minimal to no pain  Baseline:  Goal status: INITIAL  3.  Increase strength R LE to 4+/5 to 5/5  Baseline:  Goal status: INITIAL  4.  Patient reports return to regular activities including regular exercise - yoga and water exercises Baseline:  Goal status: INITIAL  5.  Independent in HEP including aquatic program as indicated  Baseline:  Goal status: INITIAL  6.  Improve functional limitation score to 48 Baseline:  Goal status: INITIAL  PLAN:  PT FREQUENCY: 2x/week  PT DURATION: 12 weeks  PLANNED INTERVENTIONS: Therapeutic exercises, Therapeutic activity, Neuromuscular re-education, Balance training, Gait training, Patient/Family education, Self Care, Joint mobilization, Aquatic Therapy, Dry Needling, Electrical stimulation, Spinal mobilization, Cryotherapy, Moist heat, Taping, Traction, Ultrasound, Ionotophoresis 4mg /ml Dexamethasone, Manual therapy, and Re-evaluation.  PLAN FOR NEXT SESSION: Progress exercises; work on functional LE strengthening, leg press, lunges; extension biased activities. continue efforts to educate patient re back care and body mechanics; manual work, DN, modalities as indicated. Message Dr [name] requesting vestibular evaluation.    Jospeh Mangel P. Leonor Liv PT, MPH 03/11/23 3:36  PM

## 2023-03-15 ENCOUNTER — Other Ambulatory Visit (HOSPITAL_BASED_OUTPATIENT_CLINIC_OR_DEPARTMENT_OTHER): Payer: Self-pay

## 2023-03-15 ENCOUNTER — Ambulatory Visit: Payer: Commercial Managed Care - PPO | Admitting: Family Medicine

## 2023-03-15 ENCOUNTER — Ambulatory Visit: Payer: Commercial Managed Care - PPO | Admitting: Rehabilitative and Restorative Service Providers"

## 2023-03-15 ENCOUNTER — Encounter: Payer: Self-pay | Admitting: Rehabilitative and Restorative Service Providers"

## 2023-03-15 VITALS — BP 126/68 | HR 81 | Temp 98.0°F | Resp 16 | Ht 70.0 in | Wt 209.8 lb

## 2023-03-15 DIAGNOSIS — G8929 Other chronic pain: Secondary | ICD-10-CM

## 2023-03-15 DIAGNOSIS — R42 Dizziness and giddiness: Secondary | ICD-10-CM | POA: Diagnosis not present

## 2023-03-15 DIAGNOSIS — Z7984 Long term (current) use of oral hypoglycemic drugs: Secondary | ICD-10-CM | POA: Diagnosis not present

## 2023-03-15 DIAGNOSIS — E119 Type 2 diabetes mellitus without complications: Secondary | ICD-10-CM

## 2023-03-15 DIAGNOSIS — R2689 Other abnormalities of gait and mobility: Secondary | ICD-10-CM | POA: Diagnosis not present

## 2023-03-15 DIAGNOSIS — M4317 Spondylolisthesis, lumbosacral region: Secondary | ICD-10-CM

## 2023-03-15 DIAGNOSIS — R29898 Other symptoms and signs involving the musculoskeletal system: Secondary | ICD-10-CM | POA: Diagnosis not present

## 2023-03-15 DIAGNOSIS — H8111 Benign paroxysmal vertigo, right ear: Secondary | ICD-10-CM | POA: Diagnosis not present

## 2023-03-15 DIAGNOSIS — M6281 Muscle weakness (generalized): Secondary | ICD-10-CM | POA: Diagnosis not present

## 2023-03-15 DIAGNOSIS — M5441 Lumbago with sciatica, right side: Secondary | ICD-10-CM | POA: Diagnosis not present

## 2023-03-15 MED ORDER — GLYBURIDE 2.5 MG PO TABS
2.5000 mg | ORAL_TABLET | Freq: Two times a day (BID) | ORAL | 2 refills | Status: DC
  Filled 2023-03-15: qty 60, 30d supply, fill #0

## 2023-03-15 NOTE — Patient Instructions (Addendum)
Keep the diet clean and stay active.  Keep checking your sugars at home.   Let us know if you need anything.

## 2023-03-15 NOTE — Progress Notes (Signed)
Chief Complaint  Patient presents with   Medication Refill    Discuss medication     Subjective: Patient is a 67 y.o. male here for f/u sugars.  Started on glyburide 5 mg/d. Has had some low sugar readings in the 80's. Sugars running in low 100's since changing to 1/2 tab. Diet is much healthier. He is struggling with exercise 2/2 back pain. No CP or SOB.   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   BPPV (benign paroxysmal positional vertigo) 08/14/2014   CAD (coronary artery disease)    2/19 PCI/DESx1 to Lcx   Cervical disc disorder with radiculopathy of cervical region 10/07/2015   Cervical radicular pain 01/10/2019   Chronic cough 04/13/2019   Decreased cardiac ejection fraction 04/02/2015   Diabetes mellitus without complication (HCC)    Dyspnea on exertion 05/15/2015   Erectile dysfunction 07/11/2019   Exercise-induced asthma    Family history of early CAD 07/05/2017   Fatigue 05/15/2015   Hallux rigidus, right foot 09/15/2021   Heart murmur    Hemarthrosis of right elbow 02/03/2016   Hoarseness 04/13/2019   Hyperlipidemia    family hx of high cholesterol   Hypogonadism in male    Incomplete rotator cuff tear 09/10/2015   Injected 09/10/2015   Lateral epicondylitis of right elbow 07/28/2019   Low back pain at multiple sites 02/05/2021   Low vitamin D level 04/22/2017   Mild persistent asthma/cough variant asthma 04/13/2019   Myofascial pain syndrome, cervical 07/28/2019   Nonischemic cardiomyopathy (HCC) 05/15/2015   Nontraumatic incomplete tear of right rotator cuff 07/25/2019    Onychomycosis 05/25/2018   Osteoarthritis    Pain in right ankle and joints of right foot 10/05/2016   Palpitations 07/06/2017   Shoulder bursitis 10/12/2014   Injected in 10/12/2014  Repeat 01/21/2015   Small thenar eminence 02/03/2016   Spinal stenosis, lumbar region, without neurogenic claudication 10/12/2014   Spondylolisthesis at L5-S1 level 05/09/2021   Strain of latissimus dorsi muscle 01/21/2015   Tendinopathy of right biceps tendon 07/25/2019   Type 2 diabetes mellitus with hyperglycemia, without long-term current use of insulin (HCC) 12/22/2013   Ulnar neuropathy at elbow of right upper extremity 01/03/2019    Objective: BP 126/68 (BP Location: Left Arm, Cuff Size: Normal)   Pulse 81   Temp 98 F (36.7 C) (Oral)   Resp 16   Ht 5\' 10"  (1.778 m)   Wt 209 lb 12.8 oz (95.2 kg)   SpO2 95%   BMI 30.10 kg/m  General: Awake, appears stated age Heart: RRR, no LE edema Lungs: CTAB, no rales, wheezes or rhonchi. No accessory muscle use Psych: Age appropriate judgment and insight, normal affect and mood  Assessment and Plan: Diabetes mellitus without complication (HCC) - Plan: glyBURIDE (DIABETA) 2.5 MG tablet  Chronic, unstable. Cont Metformin 1000 mg bid, Actos 45 mg/d, glyburide will now be 2.5 mg bid from every day. Monitor sugars at home. Counseled on diet/exercise. F/u in 2 mo as originally scheduled.  The patient voiced understanding and agreement to the plan.  Jilda Roche [place], DO 03/15/23  11:33 AM

## 2023-03-15 NOTE — Therapy (Signed)
OUTPATIENT PHYSICAL THERAPY THORACOLUMBAR TREATMENT   Patient Name: Reginald Rios MRN: [id number] DOB:06-Jan-1956, 67 y.o., male Today's Date: 03/15/2023  END OF SESSION:  PT End of Session - 03/15/23 1453     Visit Number 18    Number of Visits 24    Date for PT Re-Evaluation 03/24/23    Authorization Type [employer] $20 copay    Progress Note Due on Visit 20    PT Start Time 1446    PT Stop Time 1535    PT Time Calculation (min) 49 min    Activity Tolerance Patient tolerated treatment well              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   BPPV (benign paroxysmal positional vertigo) 08/14/2014   CAD (coronary artery disease)    2/19 PCI/DESx1 to Lcx   Cervical disc disorder with radiculopathy of cervical region 10/07/2015   Cervical radicular pain 01/10/2019   Chronic cough 04/13/2019   Decreased cardiac ejection fraction 04/02/2015   Diabetes mellitus without complication (HCC)    Dyspnea on exertion 05/15/2015   Erectile dysfunction 07/11/2019   Exercise-induced asthma    Family history of early CAD 07/05/2017   Fatigue 05/15/2015   Hallux rigidus, right foot 09/15/2021   Heart murmur    Hemarthrosis of right elbow 02/03/2016   Hoarseness 04/13/2019   Hyperlipidemia    family hx of high cholesterol   Hypogonadism in male    Incomplete rotator cuff tear 09/10/2015   Injected 09/10/2015   Lateral epicondylitis of right elbow 07/28/2019   Low back pain at multiple sites 02/05/2021   Low vitamin D level 04/22/2017   Mild persistent asthma/cough  variant asthma 04/13/2019   Myofascial pain syndrome, cervical 07/28/2019   Nonischemic cardiomyopathy (HCC) 05/15/2015   Nontraumatic incomplete tear of right rotator cuff 07/25/2019   Onychomycosis 05/25/2018   Osteoarthritis    Pain in right ankle and joints of right foot 10/05/2016   Palpitations 07/06/2017   Shoulder bursitis 10/12/2014   Injected in 10/12/2014  Repeat 01/21/2015   Small thenar eminence 02/03/2016   Spinal stenosis, lumbar region, without neurogenic claudication 10/12/2014   Spondylolisthesis at L5-S1 level 05/09/2021   Strain of latissimus dorsi muscle 01/21/2015   Tendinopathy of right biceps tendon 07/25/2019   Type 2 diabetes mellitus with hyperglycemia, without long-term current use of insulin (HCC) 12/22/2013   Ulnar neuropathy at elbow of right upper extremity 01/03/2019   Past Surgical History:  Procedure Laterality Date   CORONARY PRESSURE/FFR STUDY N/A 06/23/2017   Procedure: INTRAVASCULAR PRESSURE WIRE/FFR STUDY;  Surgeon: Marykay Lex, MD;  Location: [facility] INVASIVE CV LAB;  Service: Cardiovascular;  Laterality: N/A;   CORONARY STENT INTERVENTION N/A 06/23/2017   Procedure: CORONARY STENT INTERVENTION;  Surgeon: Marykay Lex, MD;  Location: [facility] INVASIVE CV LAB;  Service: Cardiovascular;  Laterality: N/A;   KNEE ARTHROSCOPY     LEFT HEART CATH AND CORONARY ANGIOGRAPHY N/A 06/23/2017   Procedure: LEFT HEART CATH AND CORONARY ANGIOGRAPHY;  Surgeon: Marykay Lex, MD;  Location: [facility] INVASIVE CV LAB;  Service: Cardiovascular;  Laterality: N/A;   SHOULDER ARTHROSCOPY WITH BICEPSTENOTOMY Right 09/06/2019   Procedure: SHOULDER ARTHROSCOPY WITH BICEPSTENOTOMY;  Surgeon: Tarry Kos, MD;  Location: [facility];  Service: Orthopedics;  Laterality: Right;   SHOULDER ARTHROSCOPY WITH SUBACROMIAL DECOMPRESSION Right 09/06/2019   Procedure: RIGHT SHOULDER ARTHROSCOPY WITH EXTENSIVE DEBRIDEMENT, SUBACROMIAL DECOMPRESSION, BICEPS TENOTOMY;  Surgeon: Tarry Kos, MD;  Location: [facility];  Service: Orthopedics;  Laterality: Right;   TENOTOMY ACHILLES TENDON     Patient Active Problem List   Diagnosis Date Noted   Hallux rigidus, right foot 09/15/2021   Spondylolisthesis at L5-S1 level 05/09/2021   Low back pain at multiple sites 02/05/2021   Osteoarthritis    Heart murmur    Exercise-induced asthma    Diabetes mellitus without complication (HCC)    Arthritis    Anemia    Myofascial pain syndrome, cervical 07/28/2019   Lateral epicondylitis of right elbow 07/28/2019   Nontraumatic incomplete tear of right rotator cuff 07/25/2019   Tendinopathy of right biceps tendon 07/25/2019   Erectile dysfunction 07/11/2019   Mild persistent asthma/cough variant asthma 04/13/2019   Allergic rhinitis with a possible nonallergic component 04/13/2019   Acid reflux 04/13/2019   Chronic cough 04/13/2019   Hoarseness 04/13/2019   Cervical radicular pain 01/10/2019   Ulnar neuropathy at elbow of right upper extremity 01/03/2019   Acute bilateral low back pain without sciatica 01/03/2019   Onychomycosis 05/25/2018   Palpitations 07/06/2017   CAD (coronary artery disease) 07/05/2017   Family history of early CAD 07/05/2017   Abnormal findings on diagnostic imaging of cardiovascular system 06/23/2017   Low vitamin D level 04/22/2017   Pain in right ankle and joints of right foot 10/05/2016   Hemarthrosis of right elbow 02/03/2016   Small thenar eminence 02/03/2016   Cervical disc disorder with radiculopathy of cervical region 10/07/2015   Incomplete rotator cuff tear 09/10/2015   Nonischemic cardiomyopathy (HCC) 05/15/2015   Fatigue 05/15/2015   Dyspnea on exertion 05/15/2015   Decreased cardiac ejection fraction 04/02/2015   Strain of latissimus dorsi muscle 01/21/2015   Shoulder bursitis 10/12/2014   Spinal stenosis, lumbar region, without neurogenic claudication 10/12/2014   BPPV (benign paroxysmal positional vertigo)  08/14/2014   Hyperlipidemia 01/19/2014   Anemia, unspecified 01/19/2014   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   Type 2 diabetes mellitus with hyperglycemia, without long-term current use of insulin (HCC) 12/22/2013   Hypogonadism in male 12/22/2013   Arthritis of hand, left 12/22/2013    PCP: Dr Arva Chafe, DO  REFERRING PROVIDER: Dr Rodolph Bong   REFERRING DIAG:  Spondylolisthesis at L5-S1 level  Chronic bilateral low back pain with right-sided sciatica    Rationale for Evaluation and Treatment: Rehabilitation  THERAPY DIAG:  Spondylolisthesis at L5-S1 level  Chronic bilateral low back pain with right-sided sciatica  Other symptoms and signs involving the musculoskeletal system  Muscle weakness (generalized)  ONSET DATE: 12/12/22  SUBJECTIVE:  SUBJECTIVE STATEMENT:  Patient reports that pain increased the end of the week when he was lifting in his garage. He is trying to clean out his garage which has not been cleaned or sorted in the past 6 years. He worked on his exercises and used heat and ice "all day" Saturday and finally felt like taking his dog for a walk by 5 pm. Now pain is mostly in his hip not the back. He is walking up 2-3 times/night. Pain wakes hip up and then he has to get up and go to the bathroom. Still having pain with moving in bed. Walking his dog twice a day now walking 31 minutes. Had an injection Wednesday, 02/24/23. Has been sitting in a recliner at home "a little bit". Discussed that he needs to avoid sitting in a recliner which increases back pain but he continues to sit in the recliner. He was encouraged to continue a walking program level surfaces and he has increased to 30 min twice a day. 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    Patient reports that he has been having trouble with dizziness again which he has had in the past. He would like to have this evaluated by PT.    PERTINENT HISTORY:  Chronic LBP; bilat hamstring tightness; bilat knees; cervical disc surgery; AODM   PAIN:  Are you having pain? Yes: NPRS scale: 2/10 back 6/10 R hip Pain location: LB; R hip Pain description: dull aching in the LB; not as sharp more diffuse Aggravating factors: back - sitting > 10 min; leg - turning foot/twisting  Relieving factors: meds   PRECAUTIONS: None    WEIGHT BEARING RESTRICTIONS: No  FALLS:  Has patient fallen in last 6 months? No  LIVING ENVIRONMENT: Lives with: lives with their spouse Lives in: House/apartment Stairs: Yes: Internal: 24 steps; on right going up and External: 3 steps; on right going up Has following equipment at home: None  OCCUPATION: retired [employer] officer, [employer]; active playing pickle ball    PATIENT GOALS: stop most of the pain; get back to activities   NEXT MD VISIT: 02/09/23  OBJECTIVE:   DIAGNOSTIC FINDINGS:  Xray 12/29/22 - results unavailable   PATIENT SURVEYS:  FOTO 31; goals 48    SENSATION: WFL  MUSCLE LENGTH: Hamstrings: Right 70 deg; Left 65 deg Tight hip flexors bilat   POSTURE: rounded shoulders, forward head, decreased lumbar lordosis, increased thoracic kyphosis, flexed trunk , and weight shift right  PALPATION: Pain with PA mobs lumbar spine; R lumbar paraspinals; posterior hip in gluts and piriformis; hamstrings   LUMBAR ROM:   AROM eval  Flexion 70% pain bilat LB  Extension 10% pain R mid back   Right lateral flexion 65% pull L > R LB  Left lateral flexion 60% pull R LB  Right rotation 20% pain mid back  Left rotation 20% discomfort mid back    (Blank rows = not tested)  LOWER EXTREMITY ROM:     Active Assistive Right eval Left eval  Hip flexion    Hip extension 0 0  Hip abduction    Hip adduction    Hip internal rotation    Hip external  rotation    Knee flexion 90 95  Knee extension -5 -5  Ankle dorsiflexion 0 0  Ankle plantarflexion    Ankle inversion    Ankle eversion     (Blank rows = not tested)  LOWER EXTREMITY MMT:    MMT Right eval Right  02/22/23  Left eval Left  02/22/23  Hip flexion 5- 5 5 5   Hip extension 4- 4+ 5- 5-  Hip abduction 3+ 4 4+ 5  Hip adduction      Hip internal rotation      Hip external rotation      Knee flexion 5  5   Knee extension 5  5   Ankle dorsiflexion      Ankle plantarflexion      Ankle inversion      Ankle eversion       (Blank rows = not tested)  LUMBAR SPECIAL TESTS:  Straight leg raise test: Negative and Slump test: Negative  FUNCTIONAL TESTS:  SLS: R 2 sec ; L 1 sec  5 times sit to stand: 23.5 sec  GAIT: Distance walked: 40  Assistive device utilized: None Level of assistance: Complete Independence Comments: flexed forward at trunk and hips    [facility] Adult PT Treatment:                                                DATE: 03/15/2023 Therapeutic Exercise: Nustep L7 x10 min Sitting   Sit to stand high low table UE's crossed over chest x 10 Supine  Hamstring stretch 30 sec x 2 R/L  Trunk rotation 5-10 sec x 8 R/L Bridge with ball btn knees 5 sec hold x 10 Hip abduction in hooklying green TB alternating LE's 3 sec x 10 R/L  Alternate knee drop in hooklying feet apart dropping knee in x 10 R/L  Single knee to chest 10 sec x 3 R/L   Sciatic nerve mobilization x 10 R/L Prone Prone prop lying pillow under chest x 2 min Prone partial press up x 10 Alternate arm and leg extension in prone x 10 R/L Standing  Side steps green TB above knees x 15 ft x 4 SLS ~ 20 sec x 2 R/L  Functional activities  Working on sit to sidelying to supine and reverse as well as bridging to assist with moving in bed at night  Working on rolling side to side for moving in bed at night without lifting hips when  Modalities:   Moist heat lumbar spine x 10 min    [facility] Adult PT  Treatment:                                                DATE: 03/11/2023 Therapeutic Exercise: Nustep L7 x10 min Sitting   Sit to stand high low table UE's crossed over chest x 10 Supine  Hamstring stretch 30 sec x 2 R/L  Trunk rotation 5-10 sec x 8 R/L Bridge with ball btn knees 5 sec hold x 10 Hip abduction in hooklying green TB alternating LE's 3 sec x 10 R/L   Alternate knee drop in hooklying feet apart dropping knee in x 10 R/L  Single knee to chest 10 sec x 3 R/L   Sciatic nerve mobilization x 10 R/L Prone Prone prop lying pillow under chest x 2 min Prone partial press up x 10 Stretch for ER bilat hips ~ 45 sec  Standing  Side steps green TB above knees x 15 ft x 4 Cable pull step back 15# TB 10 sec x  10  Partial lunge UE support on counter x 10 R/L SLS ~ 20 sec x 2 R/L   Modalities:   Moist heat lumbar spine x 10 min    [facility] Adult PT Treatment:                                                DATE: 03/08/2023 Therapeutic Exercise: Nustep L7 x7 min Sitting   Sit to stand high low table UE's crossed over chest x 10 Supine  gentle trunk rotation x 10  Bridge with ball btn knees 5 sec hold x 10 Dead bug alternating U/LE's x 10 (VC to engage core) Hip abduction in hooklying green TB alternating LE's 3 sec x 10 R/L  Trunk rotation 5-10 sec x 8 R/L  Alternate knee drop in hooklying feet apart dropping knee in x 10 R/L  Single knee to chest 10 sec x 5 R/L   Sciatic nerve mobilization x 10 R/L Prone Prone prop lying pillow under chest x 2 min Prone partial press up  Stretch for ER bilat hips ~ 45 sec  Standing  Side steps R/L x 10 green TB above knees  Isometric row blue TB 10 sec x 10  Partial lunge UE support on counter x 5 R/L SLS ~ 20 sec x 2 R/L (VC to engage core) Modalities:   Moist heat lumbar spine x 10 min    [facility] Adult PT Treatment:   (dry needling)                   DATE: 03/03/2023 Therapeutic Exercise: Nustep L7 x7 min Sitting   Sit to stand high  low table UE's crossed over chest x 10 Supine  gentle trunk rotation x 10  marching VC to engage core x 10 R/L  Sciatic nerve mobilization x 10 R/L  Bridge 5 sec hold x 10 Dead bug alternating U/LE's x 10 (VC to engage core) Alternate knee drop in hooklying feet apart dropping knee in x 10 R/L  Prone Prone lying pillow under chest x 2 min Standing  Isometric row blue TB 10 sec x 10  Partial lunge UE support on counter x 10 R/L SLS ~ 20 sec x 2 R/L (VC to engage core) Manual:   Manual Therapy: Passive R knee flexion/rotation (prone)  Skilled palpation to assess response to manual work and DN PA mobs lumbar spine Grade II  Trigger Point Dry-Needling  Treatment instructions: Expect mild to moderate muscle soreness. Patient verbalized understanding of these instructions and education. Patient Consent Given: Yes Education handout provided: Previously provided Muscles treated: R/L QL and lumbar paraspinals L4/5    Electrical stimulation performed: Yes Parameters: mAmp current intensity to patient tolerance  Treatment response/outcome: decreased palpable tightness     HOME EXERCISE PROGRAM: Access Code: [email or web address] URL: [email or web address] Date: 03/15/2023 Prepared by: Corlis Leak  Exercises - Isometric Dead Bug  - 2 x daily - 7 x weekly - 1 sets - 10 reps - 5-10 sec hold - Dead Bug  - 1 x daily - 7 x weekly - 3 sets - 10 reps - Supine Single Knee to Chest Stretch  - 1 x daily - 7 x weekly - 1 sets - 10 reps - 10-20 sec hold - Supine Lower Trunk Rotation  - 1 x daily - 7 x weekly - 3 sets -  10 reps - Supine Hip External Rotation Stretch  - 1 x daily - 7 x weekly - 3 sets - 10 reps - Prone Hip Internal Rotation AROM  - 1 x daily - 7 x weekly - 3 sets - 10 reps - Quadruped Bent Leg Hip Extension  - 1 x daily - 7 x weekly - 3 sets - 10 reps - Squat with Chair Touch  - 1 x daily - 7 x weekly - 3 sets - 10 reps - Forward T with Counter Support  - 1 x daily - 7 x weekly -  3 sets - 10 reps - Standing Lumbar Extension at Wall - Forearms  - 4-5 x daily - 7 x weekly - 2 sets - 10 reps - Supine Bridge  - 2 x daily - 7 x weekly - 1-3 sets - 10 reps - Prone Press Up On Elbows  - 4 x daily - 7 x weekly - 1 sets - 1 reps - 1-5 min hold - Standing Quadratus Lumborum Mobilization with Small Ball on Wall  - 1 x daily - 3-4 x weekly - 1 sets - 10 reps - Prone Press Up  - 3-4 x daily - 7 x weekly - 2 sets - 10 reps - Split Squats Upright Trunk (Quad Bias)  - 1 x daily - 3-4 x weekly - 1-2 sets - 10 reps - Seated Sidebending  - 2 x daily - 7 x weekly - 1 sets - 3 reps - 30 sec  hold  ASSESSMENT:  CLINICAL IMPRESSION: Jeramih reports increased back pain and mostly increased in the R hip with increased activities working in his garage. He will do his exercises with positive results but when he feels better he starts increasing activity level doing more strenuous activities. As he increases strenuous activities he has increased pain. Continued core and LE strengthening. He continues to be active during the day, with inappropriate activities at times. He is walking his dog twice a day for ~ 31 min. Encouraged patient to work on his home program and continue with regular walking program, gradually increasing his walking an avoiding irritating activities.    Eval: Patient is a 67 y.o. male who was seen today for physical therapy evaluation and treatment for  Spondylolisthesis at L5-S1 level and   Chronic bilateral low back pain with right-sided sciatica  .   OBJECTIVE IMPAIRMENTS: Abnormal gait, decreased activity tolerance, decreased endurance, decreased mobility, decreased ROM, hypomobility, increased fascial restrictions, increased muscle spasms, impaired flexibility, improper body mechanics, postural dysfunction, and pain.   GOALS: Goals reviewed with patient? Yes  SHORT TERM GOALS: Target date: 02/10/2023  Independent in initial HEP  Baseline: Goal status: MET  2.   Decrease LB and R LE pain by 25-50% allowing patient to return to normal activities  Baseline: R LE pain improved 80%, LBP pain no change Goal status: IN PROGRESS   LONG TERM GOALS: Target date: 03/24/2023  Increase hamstring flexibility to ~ 75 deg bilat  Baseline:  Goal status: INITIAL  2.  Trunk ROM/mobility WFL's and minimal to no pain  Baseline:  Goal status: INITIAL  3.  Increase strength R LE to 4+/5 to 5/5  Baseline:  Goal status: INITIAL  4.  Patient reports return to regular activities including regular exercise - yoga and water exercises Baseline:  Goal status: INITIAL  5.  Independent in HEP including aquatic program as indicated  Baseline:  Goal status: INITIAL  6.  Improve functional limitation  score to 48 Baseline:  Goal status: INITIAL  PLAN:  PT FREQUENCY: 2x/week  PT DURATION: 12 weeks  PLANNED INTERVENTIONS: Therapeutic exercises, Therapeutic activity, Neuromuscular re-education, Balance training, Gait training, Patient/Family education, Self Care, Joint mobilization, Aquatic Therapy, Dry Needling, Electrical stimulation, Spinal mobilization, Cryotherapy, Moist heat, Taping, Traction, Ultrasound, Ionotophoresis 4mg /ml Dexamethasone, Manual therapy, and Re-evaluation.  PLAN FOR NEXT SESSION: Progress exercises; work on functional LE strengthening, leg press, lunges; extension biased activities. continue efforts to educate patient re back care and body mechanics; manual work, DN, modalities as indicated. Message Dr [name] requesting vestibular evaluation.    Shahrzad Koble P. Leonor Liv PT, MPH 03/15/23 2:53 PM

## 2023-03-16 ENCOUNTER — Ambulatory Visit: Payer: Commercial Managed Care - PPO | Admitting: Family Medicine

## 2023-03-16 ENCOUNTER — Other Ambulatory Visit (HOSPITAL_BASED_OUTPATIENT_CLINIC_OR_DEPARTMENT_OTHER): Payer: Self-pay

## 2023-03-17 ENCOUNTER — Ambulatory Visit: Payer: Commercial Managed Care - PPO | Admitting: Rehabilitative and Restorative Service Providers"

## 2023-03-17 ENCOUNTER — Other Ambulatory Visit (HOSPITAL_BASED_OUTPATIENT_CLINIC_OR_DEPARTMENT_OTHER): Payer: Self-pay

## 2023-03-17 ENCOUNTER — Other Ambulatory Visit: Payer: Self-pay

## 2023-03-17 ENCOUNTER — Encounter: Payer: Self-pay | Admitting: Family Medicine

## 2023-03-17 ENCOUNTER — Other Ambulatory Visit: Payer: Self-pay | Admitting: Family Medicine

## 2023-03-17 DIAGNOSIS — M4317 Spondylolisthesis, lumbosacral region: Secondary | ICD-10-CM | POA: Diagnosis not present

## 2023-03-17 DIAGNOSIS — M5441 Lumbago with sciatica, right side: Secondary | ICD-10-CM | POA: Diagnosis not present

## 2023-03-17 DIAGNOSIS — M6281 Muscle weakness (generalized): Secondary | ICD-10-CM | POA: Diagnosis not present

## 2023-03-17 DIAGNOSIS — R2689 Other abnormalities of gait and mobility: Secondary | ICD-10-CM | POA: Diagnosis not present

## 2023-03-17 DIAGNOSIS — G8929 Other chronic pain: Secondary | ICD-10-CM

## 2023-03-17 DIAGNOSIS — H8111 Benign paroxysmal vertigo, right ear: Secondary | ICD-10-CM | POA: Diagnosis not present

## 2023-03-17 DIAGNOSIS — E162 Hypoglycemia, unspecified: Secondary | ICD-10-CM

## 2023-03-17 DIAGNOSIS — R29898 Other symptoms and signs involving the musculoskeletal system: Secondary | ICD-10-CM | POA: Diagnosis not present

## 2023-03-17 DIAGNOSIS — R42 Dizziness and giddiness: Secondary | ICD-10-CM | POA: Diagnosis not present

## 2023-03-17 NOTE — Therapy (Signed)
OUTPATIENT PHYSICAL THERAPY THORACOLUMBAR TREATMENT   Patient Name: Reginald Rios MRN: [id number] DOB:09/22/1955, 67 y.o., male Today's Date: 03/17/2023  END OF SESSION:  PT End of Session - 03/17/23 1016     Visit Number 19    Number of Visits 24    Date for PT Re-Evaluation 03/24/23    Authorization Type [employer] $20 copay    Progress Note Due on Visit 20    PT Start Time 1016    PT Stop Time 1100    PT Time Calculation (min) 44 min    Activity Tolerance Patient tolerated treatment well    Behavior During Therapy [facility] for tasks assessed/performed             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   BPPV (benign paroxysmal positional vertigo) 08/14/2014   CAD (coronary artery disease)    2/19 PCI/DESx1 to Lcx   Cervical disc disorder with radiculopathy of cervical region 10/07/2015   Cervical radicular pain 01/10/2019   Chronic cough 04/13/2019   Decreased cardiac ejection fraction 04/02/2015   Diabetes mellitus without complication (HCC)    Dyspnea on exertion 05/15/2015   Erectile dysfunction 07/11/2019   Exercise-induced asthma    Family history of early CAD 07/05/2017   Fatigue 05/15/2015   Hallux rigidus, right foot 09/15/2021   Heart murmur    Hemarthrosis of right elbow 02/03/2016   Hoarseness 04/13/2019   Hyperlipidemia    family hx of high cholesterol   Hypogonadism in male    Incomplete rotator cuff tear 09/10/2015   Injected 09/10/2015   Lateral epicondylitis of right elbow 07/28/2019   Low back pain at multiple sites 02/05/2021   Low  vitamin D level 04/22/2017   Mild persistent asthma/cough variant asthma 04/13/2019   Myofascial pain syndrome, cervical 07/28/2019   Nonischemic cardiomyopathy (HCC) 05/15/2015   Nontraumatic incomplete tear of right rotator cuff 07/25/2019   Onychomycosis 05/25/2018   Osteoarthritis    Pain in right ankle and joints of right foot 10/05/2016   Palpitations 07/06/2017   Shoulder bursitis 10/12/2014   Injected in 10/12/2014  Repeat 01/21/2015   Small thenar eminence 02/03/2016   Spinal stenosis, lumbar region, without neurogenic claudication 10/12/2014   Spondylolisthesis at L5-S1 level 05/09/2021   Strain of latissimus dorsi muscle 01/21/2015   Tendinopathy of right biceps tendon 07/25/2019   Type 2 diabetes mellitus with hyperglycemia, without long-term current use of insulin (HCC) 12/22/2013   Ulnar neuropathy at elbow of right upper extremity 01/03/2019   Past Surgical History:  Procedure Laterality Date   CORONARY PRESSURE/FFR STUDY N/A 06/23/2017   Procedure: INTRAVASCULAR PRESSURE WIRE/FFR STUDY;  Surgeon: Marykay Lex, MD;  Location: [facility] INVASIVE CV LAB;  Service: Cardiovascular;  Laterality: N/A;   CORONARY STENT INTERVENTION N/A 06/23/2017   Procedure: CORONARY STENT INTERVENTION;  Surgeon: Marykay Lex, MD;  Location: [facility] INVASIVE CV LAB;  Service: Cardiovascular;  Laterality: N/A;   KNEE ARTHROSCOPY     LEFT HEART CATH AND CORONARY ANGIOGRAPHY N/A 06/23/2017   Procedure: LEFT HEART CATH AND CORONARY ANGIOGRAPHY;  Surgeon: Marykay Lex, MD;  Location: [facility] INVASIVE CV LAB;  Service: Cardiovascular;  Laterality: N/A;   SHOULDER ARTHROSCOPY WITH BICEPSTENOTOMY Right 09/06/2019   Procedure: SHOULDER ARTHROSCOPY WITH BICEPSTENOTOMY;  Surgeon: Tarry Kos, MD;  Location: [facility];  Service: Orthopedics;  Laterality: Right;   SHOULDER ARTHROSCOPY WITH SUBACROMIAL DECOMPRESSION Right 09/06/2019   Procedure: RIGHT SHOULDER ARTHROSCOPY WITH EXTENSIVE DEBRIDEMENT,  SUBACROMIAL DECOMPRESSION, BICEPS TENOTOMY;  Surgeon: Tarry Kos, MD;  Location: [facility];  Service: Orthopedics;  Laterality: Right;   TENOTOMY ACHILLES TENDON     Patient Active Problem List   Diagnosis Date Noted   Hallux rigidus, right foot 09/15/2021   Spondylolisthesis at L5-S1 level 05/09/2021   Low back pain at multiple sites 02/05/2021   Osteoarthritis    Heart murmur    Exercise-induced asthma    Diabetes mellitus without complication (HCC)    Arthritis    Anemia    Myofascial pain syndrome, cervical 07/28/2019   Lateral epicondylitis of right elbow 07/28/2019   Nontraumatic incomplete tear of right rotator cuff 07/25/2019   Tendinopathy of right biceps tendon 07/25/2019   Erectile dysfunction 07/11/2019   Mild persistent asthma/cough variant asthma 04/13/2019   Allergic rhinitis with a possible nonallergic component 04/13/2019   Acid reflux 04/13/2019   Chronic cough 04/13/2019   Hoarseness 04/13/2019   Cervical radicular pain 01/10/2019   Ulnar neuropathy at elbow of right upper extremity 01/03/2019   Acute bilateral low back pain without sciatica 01/03/2019   Onychomycosis 05/25/2018   Palpitations 07/06/2017   CAD (coronary artery disease) 07/05/2017   Family history of early CAD 07/05/2017   Abnormal findings on diagnostic imaging of cardiovascular system 06/23/2017   Low vitamin D level 04/22/2017   Pain in right ankle and joints of right foot 10/05/2016   Hemarthrosis of right elbow 02/03/2016   Small thenar eminence 02/03/2016   Cervical disc disorder with radiculopathy of cervical region 10/07/2015   Incomplete rotator cuff tear 09/10/2015   Nonischemic cardiomyopathy (HCC) 05/15/2015   Fatigue 05/15/2015   Dyspnea on exertion 05/15/2015   Decreased cardiac ejection fraction 04/02/2015   Strain of latissimus dorsi muscle 01/21/2015   Shoulder bursitis 10/12/2014   Spinal stenosis, lumbar region, without neurogenic claudication  10/12/2014   BPPV (benign paroxysmal positional vertigo) 08/14/2014   Hyperlipidemia 01/19/2014   Anemia, unspecified 01/19/2014   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   Type 2 diabetes mellitus with hyperglycemia, without long-term current use of insulin (HCC) 12/22/2013   Hypogonadism in male 12/22/2013   Arthritis of hand, left 12/22/2013    PCP: Dr Arva Chafe, DO REFERRING PROVIDER: Dr Rodolph Bong   REFERRING DIAG:  Spondylolisthesis at L5-S1 level  Chronic bilateral low back pain with right-sided sciatica    Rationale for Evaluation and Treatment: Rehabilitation  THERAPY DIAG:  Spondylolisthesis at L5-S1 level  Chronic bilateral low back pain with right-sided sciatica  Other symptoms and signs involving the musculoskeletal system  Muscle weakness (generalized)  ONSET DATE: 12/12/22  SUBJECTIVE:  SUBJECTIVE STATEMENT:  The patient reports movement helps a lot. Sitting makes it worse. He reports he lays down on the floor to exercise. He reports that trying to do pickleball flares pain.   PERTINENT HISTORY:  Chronic LBP; bilat hamstring tightness; bilat knees; cervical disc surgery; AODM   PAIN:  Are you having pain? Yes: NPRS scale: 2/10 back 6/10 R hip Pain location: LB; R hip Pain description: dull aching in the LB; not as sharp more diffuse Aggravating factors: back - sitting > 10 min; leg - turning foot/twisting  Relieving factors: meds   PRECAUTIONS: None   WEIGHT BEARING RESTRICTIONS: No  FALLS:  Has patient fallen in last 6 months? No  PATIENT GOALS: stop most of the pain; get back to activities   OBJECTIVE:  DIAGNOSTIC FINDINGS:  IMPRESSION: 1. No acute fracture or traumatic listhesis. 2. Unchanged multilevel degenerative changes of  the lumbar spine with severe spinal canal narrowing at L4-L5 and moderate to severe spinal canal narrowing at L2-L3 and L5-S1. 3. Unchanged multilevel neural foraminal narrowing, severe on the left at L4-L5 and on the right at L5-S1.  PATIENT SURVEYS:  FOTO 31; goals 48    MUSCLE LENGTH: Hamstrings: Right 70 deg; Left 65 deg Tight hip flexors bilat   POSTURE: rounded shoulders, forward head, decreased lumbar lordosis, increased thoracic kyphosis, flexed trunk , and weight shift right  PALPATION: Pain with PA mobs lumbar spine; R lumbar paraspinals; posterior hip in gluts and piriformis; hamstrings   LUMBAR ROM:  AROM eval  Flexion 70% pain bilat LB  Extension 10% pain R mid back   Right lateral flexion 65% pull L > R LB  Left lateral flexion 60% pull R LB  Right rotation 20% pain mid back  Left rotation 20% discomfort mid back    (Blank rows = not tested)  LOWER EXTREMITY ROM:    Active Assistive Right eval Left eval  Hip flexion    Hip extension 0 0  Hip abduction    Hip adduction    Hip internal rotation    Hip external rotation    Knee flexion 90 95  Knee extension -5 -5  Ankle dorsiflexion 0 0  Ankle plantarflexion    Ankle inversion    Ankle eversion     (Blank rows = not tested)  LOWER EXTREMITY MMT:   MMT Right eval Right  02/22/23 Left eval Left  02/22/23  Hip flexion 5- 5 5 5   Hip extension 4- 4+ 5- 5-  Hip abduction 3+ 4 4+ 5  Hip adduction      Hip internal rotation      Hip external rotation      Knee flexion 5  5   Knee extension 5  5   Ankle dorsiflexion      Ankle plantarflexion      Ankle inversion      Ankle eversion       (Blank rows = not tested)  LUMBAR SPECIAL TESTS:  Straight leg raise test: Negative and Slump test: Negative  FUNCTIONAL TESTS:  SLS: R 2 sec ; L 1 sec  5 times sit to stand: 23.5 sec  GAIT: Distance walked: 40  Assistive device utilized: None Level of assistance: Complete Independence Comments: flexed  forward at trunk and hips   [facility] Adult PT Treatment:  DATE: 03/17/23 Therapeutic Exercise: Prone Lumbar extension x 10 reps, limited ROM Significant improvement with ROM after mobiization with movement Quadriped Cat/cow x 10 reps with tactile cues Knee rotation with one leg on yoga block x 10 reps Standing Squats x 10 reps adding 10# kettle bell Lateral lunges with UE support with overpressure into adductor stretch Supine Towel roll stretch reaching overhead with towel perpendicular to thoracolumbar junction and L5 region Manual Therapy: Mobilization with movement PA mobs with press ups and during quadriped activities PA mobs thoracolumbar junction and lateral mobs L1-L2   [facility] Adult PT Treatment:                                                DATE: 03/15/2023 Therapeutic Exercise: Nustep L7 x10 min Sitting   Sit to stand high low table UE's crossed over chest x 10 Supine  Hamstring stretch 30 sec x 2 R/L  Trunk rotation 5-10 sec x 8 R/L Bridge with ball btn knees 5 sec hold x 10 Hip abduction in hooklying green TB alternating LE's 3 sec x 10 R/L  Alternate knee drop in hooklying feet apart dropping knee in x 10 R/L  Single knee to chest 10 sec x 3 R/L   Sciatic nerve mobilization x 10 R/L Prone Prone prop lying pillow under chest x 2 min Prone partial press up x 10 Alternate arm and leg extension in prone x 10 R/L Standing  Side steps green TB above knees x 15 ft x 4 SLS ~ 20 sec x 2 R/L  Functional activities  Working on sit to sidelying to supine and reverse as well as bridging to assist with moving in bed at night  Working on rolling side to side for moving in bed at night without lifting hips when  Modalities:   Moist heat lumbar spine x 10 min    [facility] Adult PT Treatment:                                                DATE: 03/11/2023 Therapeutic Exercise: Nustep L7 x10 min Sitting   Sit to stand high low table UE's  crossed over chest x 10 Supine  Hamstring stretch 30 sec x 2 R/L  Trunk rotation 5-10 sec x 8 R/L Bridge with ball btn knees 5 sec hold x 10 Hip abduction in hooklying green TB alternating LE's 3 sec x 10 R/L   Alternate knee drop in hooklying feet apart dropping knee in x 10 R/L  Single knee to chest 10 sec x 3 R/L   Sciatic nerve mobilization x 10 R/L Prone Prone prop lying pillow under chest x 2 min Prone partial press up x 10 Stretch for ER bilat hips ~ 45 sec  Standing  Side steps green TB above knees x 15 ft x 4 Cable pull step back 15# TB 10 sec x 10  Partial lunge UE support on counter x 10 R/L SLS ~ 20 sec x 2 R/L   Modalities:   Moist heat lumbar spine x 10 min    [facility] Adult PT Treatment:  DATE: 03/08/2023 Therapeutic Exercise: Nustep L7 x7 min Sitting   Sit to stand high low table UE's crossed over chest x 10 Supine  gentle trunk rotation x 10  Bridge with ball btn knees 5 sec hold x 10 Dead bug alternating U/LE's x 10 (VC to engage core) Hip abduction in hooklying green TB alternating LE's 3 sec x 10 R/L  Trunk rotation 5-10 sec x 8 R/L  Alternate knee drop in hooklying feet apart dropping knee in x 10 R/L  Single knee to chest 10 sec x 5 R/L   Sciatic nerve mobilization x 10 R/L Prone Prone prop lying pillow under chest x 2 min Prone partial press up  Stretch for ER bilat hips ~ 45 sec  Standing  Side steps R/L x 10 green TB above knees  Isometric row blue TB 10 sec x 10  Partial lunge UE support on counter x 5 R/L SLS ~ 20 sec x 2 R/L (VC to engage core) Modalities:   Moist heat lumbar spine x 10 min    [facility] Adult PT Treatment:   (dry needling)                   DATE: 03/03/2023 Therapeutic Exercise: Nustep L7 x7 min Sitting   Sit to stand high low table UE's crossed over chest x 10 Supine  gentle trunk rotation x 10  marching VC to engage core x 10 R/L  Sciatic nerve mobilization x 10 R/L  Bridge 5  sec hold x 10 Dead bug alternating U/LE's x 10 (VC to engage core) Alternate knee drop in hooklying feet apart dropping knee in x 10 R/L  Prone Prone lying pillow under chest x 2 min Standing  Isometric row blue TB 10 sec x 10  Partial lunge UE support on counter x 10 R/L SLS ~ 20 sec x 2 R/L (VC to engage core) Manual:   Manual Therapy: Passive R knee flexion/rotation (prone)  Skilled palpation to assess response to manual work and DN PA mobs lumbar spine Grade II  Trigger Point Dry-Needling  Treatment instructions: Expect mild to moderate muscle soreness. Patient verbalized understanding of these instructions and education. Patient Consent Given: Yes Education handout provided: Previously provided Muscles treated: R/L QL and lumbar paraspinals L4/5    Electrical stimulation performed: Yes Parameters: mAmp current intensity to patient tolerance  Treatment response/outcome: decreased palpable tightness   PATIENT EDUCATION:  (none further 03/17/23) Education details: HEP  Person educated: Patient Education method: Programmer, multimedia, Facilities manager, Actor cues, Verbal cues, and Handouts Education comprehension: verbalized understanding, returned demonstration, verbal cues required, tactile cues required, and needs further education  HOME EXERCISE PROGRAM: Access Code: [email or web address] URL: [email or web address] Date: 03/15/2023 Prepared by: Corlis Leak  Exercises - Isometric Dead Bug  - 2 x daily - 7 x weekly - 1 sets - 10 reps - 5-10 sec hold - Dead Bug  - 1 x daily - 7 x weekly - 3 sets - 10 reps - Supine Single Knee to Chest Stretch  - 1 x daily - 7 x weekly - 1 sets - 10 reps - 10-20 sec hold - Supine Lower Trunk Rotation  - 1 x daily - 7 x weekly - 3 sets - 10 reps - Supine Hip External Rotation Stretch  - 1 x daily - 7 x weekly - 3 sets - 10 reps - Prone Hip Internal Rotation AROM  - 1 x daily - 7 x weekly - 3 sets - 10 reps - Quadruped  Bent Leg Hip Extension  - 1 x  daily - 7 x weekly - 3 sets - 10 reps - Squat with Chair Touch  - 1 x daily - 7 x weekly - 3 sets - 10 reps - Forward T with Counter Support  - 1 x daily - 7 x weekly - 3 sets - 10 reps - Standing Lumbar Extension at Wall - Forearms  - 4-5 x daily - 7 x weekly - 2 sets - 10 reps - Supine Bridge  - 2 x daily - 7 x weekly - 1-3 sets - 10 reps - Prone Press Up On Elbows  - 4 x daily - 7 x weekly - 1 sets - 1 reps - 1-5 min hold - Standing Quadratus Lumborum Mobilization with Small Ball on Wall  - 1 x daily - 3-4 x weekly - 1 sets - 10 reps - Prone Press Up  - 3-4 x daily - 7 x weekly - 2 sets - 10 reps - Split Squats Upright Trunk (Quad Bias)  - 1 x daily - 3-4 x weekly - 1-2 sets - 10 reps - Seated Sidebending  - 2 x daily - 7 x weekly - 1 sets - 3 reps - 30 sec  hold  ASSESSMENT:  CLINICAL IMPRESSION: The patient arrives with 6/10 R hip pain and leaves with 2/10 pain. He improves with there ex. He has hypomobility that puts him into flexed posture and improves with extension. PT to continue to work on extension strengthening for more upright posture. Also plan to progress HEP as requested by patient to make into 2-3 groups due to larger #. PT to modify and progress. Patient is returning to gym tasks (forward bending over work surface) and pickle ball. Recommended we start to add lateral lunging and dynamic activities to work on strengthening.   Eval: Patient is a 67 y.o. male who was seen today for physical therapy evaluation and treatment for  Spondylolisthesis at L5-S1 level and   Chronic bilateral low back pain with right-sided sciatica  .   OBJECTIVE IMPAIRMENTS: Abnormal gait, decreased activity tolerance, decreased endurance, decreased mobility, decreased ROM, hypomobility, increased fascial restrictions, increased muscle spasms, impaired flexibility, improper body mechanics, postural dysfunction, and pain.   GOALS: Goals reviewed with patient? Yes  SHORT TERM GOALS: Target date:  02/10/2023  Independent in initial HEP  Baseline: Goal status: MET  2.  Decrease LB and R LE pain by 25-50% allowing patient to return to normal activities  Baseline: R LE pain improved 80%, LBP pain no change Goal status: IN PROGRESS   LONG TERM GOALS: Target date: 03/24/2023  Increase hamstring flexibility to ~ 75 deg bilat  Baseline:  Goal status: INITIAL  2.  Trunk ROM/mobility WFL's and minimal to no pain  Baseline:  Goal status: INITIAL  3.  Increase strength R LE to 4+/5 to 5/5  Baseline:  Goal status: INITIAL  4.  Patient reports return to regular activities including regular exercise - yoga and water exercises Baseline:  Goal status: INITIAL  5.  Independent in HEP including aquatic program as indicated  Baseline:  Goal status: INITIAL  6.  Improve functional limitation score to 48 Baseline:  Goal status: INITIAL  PLAN:  PT FREQUENCY: 2x/week  PT DURATION: 12 weeks  PLANNED INTERVENTIONS: Therapeutic exercises, Therapeutic activity, Neuromuscular re-education, Balance training, Gait training, Patient/Family education, Self Care, Joint mobilization, Aquatic Therapy, Dry Needling, Electrical stimulation, Spinal mobilization, Cryotherapy, Moist heat, Taping, Traction, Ultrasound, Ionotophoresis 4mg /ml Dexamethasone,  Manual therapy, and Re-evaluation.  PLAN FOR NEXT SESSION: Progress exercises; work on functional LE strengthening, leg press, lunges; extension biased activities. continue efforts to educate patient re back care and body mechanics; manual work, DN, modalities as indicated. Message Dr [name] requesting vestibular evaluation.  Next couple of visits:  work on consolidating HEP to 2-3 sheets (wants dividing strength, ROM/mobility type activities).  Zacharias Ridling, PT 03/17/2023

## 2023-03-18 ENCOUNTER — Other Ambulatory Visit (HOSPITAL_BASED_OUTPATIENT_CLINIC_OR_DEPARTMENT_OTHER): Payer: Self-pay

## 2023-03-18 ENCOUNTER — Other Ambulatory Visit: Payer: Self-pay | Admitting: Family Medicine

## 2023-03-18 MED ORDER — GVOKE HYPOPEN 1-PACK 1 MG/0.2ML ~~LOC~~ SOAJ
SUBCUTANEOUS | 2 refills | Status: AC
  Filled 2023-03-18: qty 0.2, 1d supply, fill #0

## 2023-03-22 ENCOUNTER — Ambulatory Visit: Payer: Commercial Managed Care - PPO

## 2023-03-22 ENCOUNTER — Encounter: Payer: Self-pay | Admitting: Family Medicine

## 2023-03-22 ENCOUNTER — Other Ambulatory Visit: Payer: Self-pay

## 2023-03-22 DIAGNOSIS — M6281 Muscle weakness (generalized): Secondary | ICD-10-CM

## 2023-03-22 DIAGNOSIS — G8929 Other chronic pain: Secondary | ICD-10-CM | POA: Diagnosis not present

## 2023-03-22 DIAGNOSIS — M4317 Spondylolisthesis, lumbosacral region: Secondary | ICD-10-CM

## 2023-03-22 DIAGNOSIS — R29898 Other symptoms and signs involving the musculoskeletal system: Secondary | ICD-10-CM

## 2023-03-22 DIAGNOSIS — R2689 Other abnormalities of gait and mobility: Secondary | ICD-10-CM | POA: Diagnosis not present

## 2023-03-22 DIAGNOSIS — R42 Dizziness and giddiness: Secondary | ICD-10-CM

## 2023-03-22 DIAGNOSIS — M5441 Lumbago with sciatica, right side: Secondary | ICD-10-CM | POA: Diagnosis not present

## 2023-03-22 DIAGNOSIS — H8111 Benign paroxysmal vertigo, right ear: Secondary | ICD-10-CM | POA: Diagnosis not present

## 2023-03-22 NOTE — Therapy (Signed)
OUTPATIENT PHYSICAL THERAPY THORACOLUMBAR TREATMENT   Patient Name: NIZAR FRIEDRICH MRN: [id number] DOB:1955-09-19, 67 y.o., male Today's Date: 03/22/2023  END OF SESSION:  PT End of Session - 03/22/23 1533     Visit Number 20    Number of Visits 24    Date for PT Re-Evaluation 03/24/23    Authorization Type [employer] $20 copay    Progress Note Due on Visit 20    PT Start Time 1535    PT Stop Time 1640    PT Time Calculation (min) 65 min    Activity Tolerance Patient tolerated treatment well    Behavior During Therapy [facility] for tasks assessed/performed             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   BPPV (benign paroxysmal positional vertigo) 08/14/2014   CAD (coronary artery disease)    2/19 PCI/DESx1 to Lcx   Cervical disc disorder with radiculopathy of cervical region 10/07/2015   Cervical radicular pain 01/10/2019   Chronic cough 04/13/2019   Decreased cardiac ejection fraction 04/02/2015   Diabetes mellitus without complication (HCC)    Dyspnea on exertion 05/15/2015   Erectile dysfunction 07/11/2019   Exercise-induced asthma    Family history of early CAD 07/05/2017   Fatigue 05/15/2015   Hallux rigidus, right foot 09/15/2021   Heart murmur    Hemarthrosis of right elbow 02/03/2016   Hoarseness 04/13/2019   Hyperlipidemia    family hx of high cholesterol   Hypogonadism in male    Incomplete rotator cuff tear 09/10/2015   Injected 09/10/2015   Lateral epicondylitis of right elbow 07/28/2019   Low back pain at multiple sites 02/05/2021   Low  vitamin D level 04/22/2017   Mild persistent asthma/cough variant asthma 04/13/2019   Myofascial pain syndrome, cervical 07/28/2019   Nonischemic cardiomyopathy (HCC) 05/15/2015   Nontraumatic incomplete tear of right rotator cuff 07/25/2019   Onychomycosis 05/25/2018   Osteoarthritis    Pain in right ankle and joints of right foot 10/05/2016   Palpitations 07/06/2017   Shoulder bursitis 10/12/2014   Injected in 10/12/2014  Repeat 01/21/2015   Small thenar eminence 02/03/2016   Spinal stenosis, lumbar region, without neurogenic claudication 10/12/2014   Spondylolisthesis at L5-S1 level 05/09/2021   Strain of latissimus dorsi muscle 01/21/2015   Tendinopathy of right biceps tendon 07/25/2019   Type 2 diabetes mellitus with hyperglycemia, without long-term current use of insulin (HCC) 12/22/2013   Ulnar neuropathy at elbow of right upper extremity 01/03/2019   Past Surgical History:  Procedure Laterality Date   CORONARY PRESSURE/FFR STUDY N/A 06/23/2017   Procedure: INTRAVASCULAR PRESSURE WIRE/FFR STUDY;  Surgeon: Marykay Lex, MD;  Location: [facility] INVASIVE CV LAB;  Service: Cardiovascular;  Laterality: N/A;   CORONARY STENT INTERVENTION N/A 06/23/2017   Procedure: CORONARY STENT INTERVENTION;  Surgeon: Marykay Lex, MD;  Location: [facility] INVASIVE CV LAB;  Service: Cardiovascular;  Laterality: N/A;   KNEE ARTHROSCOPY     LEFT HEART CATH AND CORONARY ANGIOGRAPHY N/A 06/23/2017   Procedure: LEFT HEART CATH AND CORONARY ANGIOGRAPHY;  Surgeon: Marykay Lex, MD;  Location: [facility] INVASIVE CV LAB;  Service: Cardiovascular;  Laterality: N/A;   SHOULDER ARTHROSCOPY WITH BICEPSTENOTOMY Right 09/06/2019   Procedure: SHOULDER ARTHROSCOPY WITH BICEPSTENOTOMY;  Surgeon: Tarry Kos, MD;  Location: [facility];  Service: Orthopedics;  Laterality: Right;   SHOULDER ARTHROSCOPY WITH SUBACROMIAL DECOMPRESSION Right 09/06/2019   Procedure: RIGHT SHOULDER ARTHROSCOPY WITH EXTENSIVE DEBRIDEMENT,  SUBACROMIAL DECOMPRESSION, BICEPS TENOTOMY;  Surgeon: Tarry Kos, MD;  Location:  SURGERY CENTER;  Service: Orthopedics;  Laterality: Right;   TENOTOMY ACHILLES TENDON     Patient Active Problem List   Diagnosis Date Noted   Hallux rigidus, right foot 09/15/2021   Spondylolisthesis at L5-S1 level 05/09/2021   Low back pain at multiple sites 02/05/2021   Osteoarthritis    Heart murmur    Exercise-induced asthma    Diabetes mellitus without complication (HCC)    Arthritis    Anemia    Myofascial pain syndrome, cervical 07/28/2019   Lateral epicondylitis of right elbow 07/28/2019   Nontraumatic incomplete tear of right rotator cuff 07/25/2019   Tendinopathy of right biceps tendon 07/25/2019   Erectile dysfunction 07/11/2019   Mild persistent asthma/cough variant asthma 04/13/2019   Allergic rhinitis with a possible nonallergic component 04/13/2019   Acid reflux 04/13/2019   Chronic cough 04/13/2019   Hoarseness 04/13/2019   Cervical radicular pain 01/10/2019   Ulnar neuropathy at elbow of right upper extremity 01/03/2019   Acute bilateral low back pain without sciatica 01/03/2019   Onychomycosis 05/25/2018   Palpitations 07/06/2017   CAD (coronary artery disease) 07/05/2017   Family history of early CAD 07/05/2017   Abnormal findings on diagnostic imaging of cardiovascular system 06/23/2017   Low vitamin D level 04/22/2017   Pain in right ankle and joints of right foot 10/05/2016   Hemarthrosis of right elbow 02/03/2016   Small thenar eminence 02/03/2016   Cervical disc disorder with radiculopathy of cervical region 10/07/2015   Incomplete rotator cuff tear 09/10/2015   Nonischemic cardiomyopathy (HCC) 05/15/2015   Fatigue 05/15/2015   Dyspnea on exertion 05/15/2015   Decreased cardiac ejection fraction 04/02/2015   Strain of latissimus dorsi muscle 01/21/2015   Shoulder bursitis 10/12/2014   Spinal stenosis, lumbar region, without neurogenic claudication  10/12/2014   BPPV (benign paroxysmal positional vertigo) 08/14/2014   Hyperlipidemia 01/19/2014   Anemia, unspecified 01/19/2014   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   Type 2 diabetes mellitus with hyperglycemia, without long-term current use of insulin (HCC) 12/22/2013   Hypogonadism in male 12/22/2013   Arthritis of hand, left 12/22/2013    PCP: Dr Arva Chafe, DO REFERRING PROVIDER: Dr Rodolph Bong   REFERRING DIAG:  Spondylolisthesis at L5-S1 level  Chronic bilateral low back pain with right-sided sciatica    Rationale for Evaluation and Treatment: Rehabilitation  THERAPY DIAG:  Spondylolisthesis at L5-S1 level  Chronic bilateral low back pain with right-sided sciatica  Other symptoms and signs involving the musculoskeletal system  Muscle weakness (generalized)  ONSET DATE: 12/12/22  SUBJECTIVE:  SUBJECTIVE STATEMENT:  Patient reports 5/10 pain in low back today; states he was able to tolerate playing 2 hours of pickleball. Patient states he left last PT session with no pain.   PERTINENT HISTORY:  Chronic LBP; bilat hamstring tightness; bilat knees; cervical disc surgery; AODM   PAIN:  Are you having pain? Yes: NPRS scale: 2/10 back 6/10 R hip Pain location: LB; R hip Pain description: dull aching in the LB; not as sharp more diffuse Aggravating factors: back - sitting > 10 min; leg - turning foot/twisting  Relieving factors: meds   PRECAUTIONS: None   WEIGHT BEARING RESTRICTIONS: No  FALLS:  Has patient fallen in last 6 months? No  PATIENT GOALS: stop most of the pain; get back to activities   OBJECTIVE:  DIAGNOSTIC FINDINGS:  IMPRESSION: 1. No acute fracture or traumatic listhesis. 2. Unchanged multilevel degenerative changes of the lumbar  spine with severe spinal canal narrowing at L4-L5 and moderate to severe spinal canal narrowing at L2-L3 and L5-S1. 3. Unchanged multilevel neural foraminal narrowing, severe on the left at L4-L5 and on the right at L5-S1.  PATIENT SURVEYS:  FOTO 31; goals 48    MUSCLE LENGTH: Hamstrings: Right 70 deg; Left 65 deg Tight hip flexors bilat   POSTURE: rounded shoulders, forward head, decreased lumbar lordosis, increased thoracic kyphosis, flexed trunk , and weight shift right  PALPATION: Pain with PA mobs lumbar spine; R lumbar paraspinals; posterior hip in gluts and piriformis; hamstrings   LUMBAR ROM:  AROM eval  Flexion 70% pain bilat LB  Extension 10% pain R mid back   Right lateral flexion 65% pull L > R LB  Left lateral flexion 60% pull R LB  Right rotation 20% pain mid back  Left rotation 20% discomfort mid back    (Blank rows = not tested)  LOWER EXTREMITY ROM:    Active Assistive Right eval Left eval  Hip flexion    Hip extension 0 0  Hip abduction    Hip adduction    Hip internal rotation    Hip external rotation    Knee flexion 90 95  Knee extension -5 -5  Ankle dorsiflexion 0 0  Ankle plantarflexion    Ankle inversion    Ankle eversion     (Blank rows = not tested)  LOWER EXTREMITY MMT:   MMT Right eval Right  02/22/23 Left eval Left  02/22/23  Hip flexion 5- 5 5 5   Hip extension 4- 4+ 5- 5-  Hip abduction 3+ 4 4+ 5  Hip adduction      Hip internal rotation      Hip external rotation      Knee flexion 5  5   Knee extension 5  5   Ankle dorsiflexion      Ankle plantarflexion      Ankle inversion      Ankle eversion       (Blank rows = not tested)  LUMBAR SPECIAL TESTS:  Straight leg raise test: Negative and Slump test: Negative  FUNCTIONAL TESTS:  SLS: R 2 sec ; L 1 sec  5 times sit to stand: 23.5 sec  GAIT: Distance walked: 40  Assistive device utilized: None Level of assistance: Complete Independence Comments: flexed forward at  trunk and hips   [facility] Adult PT Treatment:  DATE: 03/22/2023 Therapeutic Exercise: Counter: L stretch HS stretch Thoracic rotation (thred the needle) Doorway spinal stretch --> sliding dowel up frame Paloff press blue TB --> horiz paloff press blue TB Cat/cow --> added RTB to isolate thoracic mobility (discontinued d/t pain) Wag the tail Prone:  Propped up on elbows --> added knee flexion Press up (lumbar extension) x10 Head down --> knee flexion x10 (B) Quadruped: Child's pose --> flat back rock back  Small range hip extension (knee float back) Stabilizing knee propped on yoga block --> opp hip rotation Figure LTR Prone press up Modalities: Supine on ice pack x 10 min   [facility] Adult PT Treatment:                                                DATE: 03/17/23 Therapeutic Exercise: Prone Lumbar extension x 10 reps, limited ROM Significant improvement with ROM after mobiization with movement Quadriped Cat/cow x 10 reps with tactile cues Knee rotation with one leg on yoga block x 10 reps Standing Squats x 10 reps adding 10# kettle bell Lateral lunges with UE support with overpressure into adductor stretch Supine Towel roll stretch reaching overhead with towel perpendicular to thoracolumbar junction and L5 region Manual Therapy: Mobilization with movement PA mobs with press ups and during quadriped activities PA mobs thoracolumbar junction and lateral mobs L1-L2    [facility] Adult PT Treatment:                                                DATE: 03/15/2023 Therapeutic Exercise: Nustep L7 x10 min Sitting   Sit to stand high low table UE's crossed over chest x 10 Supine  Hamstring stretch 30 sec x 2 R/L  Trunk rotation 5-10 sec x 8 R/L Bridge with ball btn knees 5 sec hold x 10 Hip abduction in hooklying green TB alternating LE's 3 sec x 10 R/L  Alternate knee drop in hooklying feet apart dropping knee in x 10 R/L  Single knee to  chest 10 sec x 3 R/L   Sciatic nerve mobilization x 10 R/L Prone Prone prop lying pillow under chest x 2 min Prone partial press up x 10 Alternate arm and leg extension in prone x 10 R/L Standing  Side steps green TB above knees x 15 ft x 4 SLS ~ 20 sec x 2 R/L  Functional activities  Working on sit to sidelying to supine and reverse as well as bridging to assist with moving in bed at night  Working on rolling side to side for moving in bed at night without lifting hips when  Modalities:   Moist heat lumbar spine x 10 min    PATIENT EDUCATION:  (none further 03/17/23) Education details: HEP  Person educated: Patient Education method: Programmer, multimedia, Facilities manager, Actor cues, Verbal cues, and Handouts Education comprehension: verbalized understanding, returned demonstration, verbal cues required, tactile cues required, and needs further education  HOME EXERCISE PROGRAM: Access Code: [email or web address] URL: [email or web address] Date: 03/22/2023 Prepared by: Carlynn Herald  Exercises - Dead Bug  - 1 x daily - 7 x weekly - 3 sets - 10 reps - Supine Single Knee to Chest Stretch  - 1 x daily - 7 x weekly - 1 sets -  10 reps - 10-20 sec hold - Supine Lower Trunk Rotation  - 1 x daily - 7 x weekly - 3 sets - 10 reps - Quadruped Bent Leg Hip Extension  - 1 x daily - 7 x weekly - 3 sets - 10 reps - Squat with Chair Touch  - 1 x daily - 7 x weekly - 3 sets - 10 reps - Standing Lumbar Extension at Wall - Forearms  - 4-5 x daily - 7 x weekly - 2 sets - 10 reps - Supine Bridge  - 2 x daily - 7 x weekly - 1-3 sets - 10 reps - Prone Press Up  - 3-4 x daily - 7 x weekly - 2 sets - 10 reps - Split Squats Upright Trunk (Quad Bias)  - 1 x daily - 3-4 x weekly - 1-2 sets - 10 reps - Seated Sidebending  - 2 x daily - 7 x weekly - 1 sets - 3 reps - 30 sec  hold - Supine Hip Internal and External Rotation  - 1 x daily - 7 x weekly - 3 sets - 10 reps - Quadruped Thoracic Rotation Full Range with  Hand on Neck  - 1 x daily - 7 x weekly - 3 sets - 10 reps - Modified Prone Quadriceps Stretch with Strap  - 1 x daily - 7 x weekly - 3 sets - 10 reps  ASSESSMENT:  CLINICAL IMPRESSION: Spinal stretching variations continued to promote elongation and relaxation along thoracolumbar paraspinals. Noted lumbar compensation due to thoracic tightness during cat/cow variations. Increased pain on R-side of lumbar spine with trunk rotation to R in supine  Eval: Patient is a 67 y.o. male who was seen today for physical therapy evaluation and treatment for  Spondylolisthesis at L5-S1 level and   Chronic bilateral low back pain with right-sided sciatica  .   OBJECTIVE IMPAIRMENTS: Abnormal gait, decreased activity tolerance, decreased endurance, decreased mobility, decreased ROM, hypomobility, increased fascial restrictions, increased muscle spasms, impaired flexibility, improper body mechanics, postural dysfunction, and pain.   GOALS: Goals reviewed with patient? Yes  SHORT TERM GOALS: Target date: 02/10/2023  Independent in initial HEP  Baseline: Goal status: MET  2.  Decrease LB and R LE pain by 25-50% allowing patient to return to normal activities  Baseline: R LE pain improved 80%, LBP pain no change Goal status: IN PROGRESS   LONG TERM GOALS: Target date: 03/24/2023  Increase hamstring flexibility to ~ 75 deg bilat  Baseline:  Goal status: INITIAL  2.  Trunk ROM/mobility WFL's and minimal to no pain  Baseline:  Goal status: INITIAL  3.  Increase strength R LE to 4+/5 to 5/5  Baseline:  Goal status: INITIAL  4.  Patient reports return to regular activities including regular exercise - yoga and water exercises Baseline:  Goal status: INITIAL  5.  Independent in HEP including aquatic program as indicated  Baseline:  Goal status: INITIAL  6.  Improve functional limitation score to 48 Baseline:  Goal status: INITIAL  PLAN:  PT FREQUENCY: 2x/week  PT DURATION: 12  weeks  PLANNED INTERVENTIONS: Therapeutic exercises, Therapeutic activity, Neuromuscular re-education, Balance training, Gait training, Patient/Family education, Self Care, Joint mobilization, Aquatic Therapy, Dry Needling, Electrical stimulation, Spinal mobilization, Cryotherapy, Moist heat, Taping, Traction, Ultrasound, Ionotophoresis 4mg /ml Dexamethasone, Manual therapy, and Re-evaluation.  PLAN FOR NEXT SESSION: Work on consolidating HEP to 2-3 sheets (wants dividing strength, ROM/mobility type activities). Vest eval; Re-eval next visit  Sanjuana Mae, PTA 03/22/2023  4:41 PM

## 2023-03-23 ENCOUNTER — Telehealth: Payer: Self-pay | Admitting: Family Medicine

## 2023-03-23 ENCOUNTER — Other Ambulatory Visit: Payer: Self-pay

## 2023-03-23 ENCOUNTER — Other Ambulatory Visit: Payer: Self-pay | Admitting: Family Medicine

## 2023-03-23 ENCOUNTER — Other Ambulatory Visit (HOSPITAL_BASED_OUTPATIENT_CLINIC_OR_DEPARTMENT_OTHER): Payer: Self-pay

## 2023-03-23 DIAGNOSIS — E291 Testicular hypofunction: Secondary | ICD-10-CM

## 2023-03-23 MED ORDER — METHYLPHENIDATE HCL 20 MG PO TABS: 20.0000 mg | ORAL_TABLET | Freq: Two times a day (BID) | ORAL | 0 refills | Status: DC

## 2023-03-23 MED ORDER — METHYLPHENIDATE HCL 20 MG PO TABS
20.0000 mg | ORAL_TABLET | Freq: Two times a day (BID) | ORAL | 0 refills | Status: DC
  Filled 2023-05-28: qty 60, 30d supply, fill #0

## 2023-03-23 MED ORDER — DICYCLOMINE HCL 10 MG PO CAPS
10.0000 mg | ORAL_CAPSULE | Freq: Four times a day (QID) | ORAL | 0 refills | Status: DC | PRN
  Filled 2023-03-23: qty 30, 8d supply, fill #0

## 2023-03-23 MED ORDER — METHYLPHENIDATE HCL 20 MG PO TABS
20.0000 mg | ORAL_TABLET | Freq: Two times a day (BID) | ORAL | 0 refills | Status: DC
  Filled 2023-03-23: qty 60, 30d supply, fill #0

## 2023-03-23 MED ORDER — FREESTYLE LITE TEST VI STRP
1.0000 | ORAL_STRIP | Freq: Every day | 3 refills | Status: AC
  Filled 2023-03-23: qty 100, 90d supply, fill #0
  Filled 2023-07-17: qty 100, 90d supply, fill #1
  Filled 2023-08-25 – 2023-08-27 (×2): qty 100, 90d supply, fill #2

## 2023-03-23 MED ORDER — TESTOSTERONE 50 MG/5GM (1%) TD GEL
TRANSDERMAL | 5 refills | Status: DC
  Filled 2023-03-23: qty 300, 30d supply, fill #0
  Filled 2023-05-10: qty 300, 30d supply, fill #1
  Filled 2023-06-25 – 2023-06-28 (×2): qty 150, 15d supply, fill #2
  Filled 2023-07-01: qty 300, 30d supply, fill #2
  Filled 2023-08-25: qty 300, 30d supply, fill #3

## 2023-03-23 NOTE — Telephone Encounter (Signed)
Requesting: methylphenidate 20mg  and testosterone Contract: None UDS: None Last Visit: 03/15/23 Next Visit:  05/11/23 Last Refill: multiple controls, see med list   Please Advise

## 2023-03-23 NOTE — Telephone Encounter (Signed)
Patient called and needs a refill on  glucose blood (FREESTYLE LITE) test strip Please send to pharmacy [facility].

## 2023-03-23 NOTE — Telephone Encounter (Signed)
Refill sent.

## 2023-03-24 ENCOUNTER — Encounter: Payer: Self-pay | Admitting: Rehabilitative and Restorative Service Providers"

## 2023-03-24 ENCOUNTER — Other Ambulatory Visit (HOSPITAL_BASED_OUTPATIENT_CLINIC_OR_DEPARTMENT_OTHER): Payer: Self-pay

## 2023-03-24 ENCOUNTER — Ambulatory Visit: Payer: Commercial Managed Care - PPO | Admitting: Rehabilitative and Restorative Service Providers"

## 2023-03-24 DIAGNOSIS — M6281 Muscle weakness (generalized): Secondary | ICD-10-CM

## 2023-03-24 DIAGNOSIS — M4317 Spondylolisthesis, lumbosacral region: Secondary | ICD-10-CM | POA: Diagnosis not present

## 2023-03-24 DIAGNOSIS — R29898 Other symptoms and signs involving the musculoskeletal system: Secondary | ICD-10-CM | POA: Diagnosis not present

## 2023-03-24 DIAGNOSIS — H8111 Benign paroxysmal vertigo, right ear: Secondary | ICD-10-CM

## 2023-03-24 DIAGNOSIS — R42 Dizziness and giddiness: Secondary | ICD-10-CM | POA: Diagnosis not present

## 2023-03-24 DIAGNOSIS — M5441 Lumbago with sciatica, right side: Secondary | ICD-10-CM | POA: Diagnosis not present

## 2023-03-24 DIAGNOSIS — G8929 Other chronic pain: Secondary | ICD-10-CM

## 2023-03-24 DIAGNOSIS — R2689 Other abnormalities of gait and mobility: Secondary | ICD-10-CM | POA: Diagnosis not present

## 2023-03-24 NOTE — Therapy (Signed)
OUTPATIENT PHYSICAL THERAPY THORACOLUMBAR TREATMENT and Re-EVALUATION   Patient Name: Reginald Rios MRN: [id number] DOB:07-Nov-1955, 67 y.o., male Today's Date: 03/24/2023  END OF SESSION:  PT End of Session - 03/24/23 1107     Visit Number 21    Number of Visits 24    Date for PT Re-Evaluation 03/24/23    Authorization Type [employer] $20 copay    Authorization - Number of Visits 21    Progress Note Due on Visit 20    PT Start Time 1107    PT Stop Time 1147    PT Time Calculation (min) 40 min    Activity Tolerance Patient tolerated treatment well    Behavior During Therapy [facility] for tasks assessed/performed              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   BPPV (benign paroxysmal positional vertigo) 08/14/2014   CAD (coronary artery disease)    2/19 PCI/DESx1 to Lcx   Cervical disc disorder with radiculopathy of cervical region 10/07/2015   Cervical radicular pain 01/10/2019   Chronic cough 04/13/2019   Decreased cardiac ejection fraction 04/02/2015   Diabetes mellitus without complication (HCC)    Dyspnea on exertion 05/15/2015   Erectile dysfunction 07/11/2019   Exercise-induced asthma    Family history of early CAD 07/05/2017   Fatigue 05/15/2015   Hallux rigidus, right foot 09/15/2021   Heart murmur    Hemarthrosis of right elbow 02/03/2016   Hoarseness 04/13/2019   Hyperlipidemia    family hx of high cholesterol   Hypogonadism in male    Incomplete rotator cuff tear 09/10/2015   Injected 09/10/2015   Lateral epicondylitis of right elbow 07/28/2019    Low back pain at multiple sites 02/05/2021   Low vitamin D level 04/22/2017   Mild persistent asthma/cough variant asthma 04/13/2019   Myofascial pain syndrome, cervical 07/28/2019   Nonischemic cardiomyopathy (HCC) 05/15/2015   Nontraumatic incomplete tear of right rotator cuff 07/25/2019   Onychomycosis 05/25/2018   Osteoarthritis    Pain in right ankle and joints of right foot 10/05/2016   Palpitations 07/06/2017   Shoulder bursitis 10/12/2014   Injected in 10/12/2014  Repeat 01/21/2015   Small thenar eminence 02/03/2016   Spinal stenosis, lumbar region, without neurogenic claudication 10/12/2014   Spondylolisthesis at L5-S1 level 05/09/2021   Strain of latissimus dorsi muscle 01/21/2015   Tendinopathy of right biceps tendon 07/25/2019   Type 2 diabetes mellitus with hyperglycemia, without long-term current use of insulin (HCC) 12/22/2013   Ulnar neuropathy at elbow of right upper extremity 01/03/2019   Past Surgical History:  Procedure Laterality Date   CORONARY PRESSURE/FFR STUDY N/A 06/23/2017   Procedure: INTRAVASCULAR PRESSURE WIRE/FFR STUDY;  Surgeon: Marykay Lex, MD;  Location: [facility] INVASIVE CV LAB;  Service: Cardiovascular;  Laterality: N/A;   CORONARY STENT INTERVENTION N/A 06/23/2017   Procedure: CORONARY STENT INTERVENTION;  Surgeon: Marykay Lex, MD;  Location: [facility] INVASIVE CV LAB;  Service: Cardiovascular;  Laterality: N/A;   KNEE ARTHROSCOPY     LEFT HEART CATH AND CORONARY ANGIOGRAPHY N/A 06/23/2017   Procedure: LEFT  HEART CATH AND CORONARY ANGIOGRAPHY;  Surgeon: Marykay Lex, MD;  Location: [facility] INVASIVE CV LAB;  Service: Cardiovascular;  Laterality: N/A;   SHOULDER ARTHROSCOPY WITH BICEPSTENOTOMY Right 09/06/2019   Procedure: SHOULDER ARTHROSCOPY WITH BICEPSTENOTOMY;  Surgeon: Tarry Kos, MD;  Location: [facility];  Service: Orthopedics;  Laterality: Right;   SHOULDER ARTHROSCOPY WITH SUBACROMIAL DECOMPRESSION Right 09/06/2019   Procedure:  RIGHT SHOULDER ARTHROSCOPY WITH EXTENSIVE DEBRIDEMENT, SUBACROMIAL DECOMPRESSION, BICEPS TENOTOMY;  Surgeon: Tarry Kos, MD;  Location: [facility];  Service: Orthopedics;  Laterality: Right;   TENOTOMY ACHILLES TENDON     Patient Active Problem List   Diagnosis Date Noted   Hallux rigidus, right foot 09/15/2021   Spondylolisthesis at L5-S1 level 05/09/2021   Low back pain at multiple sites 02/05/2021   Osteoarthritis    Heart murmur    Exercise-induced asthma    Diabetes mellitus without complication (HCC)    Arthritis    Anemia    Myofascial pain syndrome, cervical 07/28/2019   Lateral epicondylitis of right elbow 07/28/2019   Nontraumatic incomplete tear of right rotator cuff 07/25/2019   Tendinopathy of right biceps tendon 07/25/2019   Erectile dysfunction 07/11/2019   Mild persistent asthma/cough variant asthma 04/13/2019   Allergic rhinitis with a possible nonallergic component 04/13/2019   Acid reflux 04/13/2019   Chronic cough 04/13/2019   Hoarseness 04/13/2019   Cervical radicular pain 01/10/2019   Ulnar neuropathy at elbow of right upper extremity 01/03/2019   Acute bilateral low back pain without sciatica 01/03/2019   Onychomycosis 05/25/2018   Palpitations 07/06/2017   CAD (coronary artery disease) 07/05/2017   Family history of early CAD 07/05/2017   Abnormal findings on diagnostic imaging of cardiovascular system 06/23/2017   Low vitamin D level 04/22/2017   Pain in right ankle and joints of right foot 10/05/2016   Hemarthrosis of right elbow 02/03/2016   Small thenar eminence 02/03/2016   Cervical disc disorder with radiculopathy of cervical region 10/07/2015   Incomplete rotator cuff tear 09/10/2015   Nonischemic cardiomyopathy (HCC) 05/15/2015   Fatigue 05/15/2015   Dyspnea on exertion 05/15/2015   Decreased cardiac ejection fraction 04/02/2015   Strain of latissimus dorsi muscle 01/21/2015   Shoulder bursitis 10/12/2014   Spinal stenosis,  lumbar region, without neurogenic claudication 10/12/2014   BPPV (benign paroxysmal positional vertigo) 08/14/2014   Hyperlipidemia 01/19/2014   Anemia, unspecified 01/19/2014   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   Type 2 diabetes mellitus with hyperglycemia, without long-term current use of insulin (HCC) 12/22/2013   Hypogonadism in male 12/22/2013   Arthritis of hand, left 12/22/2013    PCP: Dr Arva Chafe, DO REFERRING PROVIDER: Dr Rodolph Bong   REFERRING DIAG:  Spondylolisthesis at L5-S1 level  Chronic bilateral low back pain with right-sided sciatica    Rationale for Evaluation and Treatment: Rehabilitation  THERAPY DIAG:  Muscle weakness (generalized)  Chronic bilateral low back pain with right-sided sciatica  Other symptoms and signs involving the musculoskeletal system  Dizziness and giddiness  BPPV (benign paroxysmal positional vertigo), right  ONSET DATE: 12/12/22  SUBJECTIVE:  SUBJECTIVE STATEMENT: The patient has a new order for vestibular evaluation. We want to assess for vertigo, however he notes his back has a greater need today. He has pain in his hips and central low back.  He comes in today reporting 8-9/10 since last therapy session noting he feels like it was too much strengthening. He also reports he walked, 9000 steps prior to therapy that day and 2 hours of pickel ball.  R leg felt weaker this morning.  Patient reports 5/10 pain in low back today; states he was able to tolerate playing 2 hours of pickleball. Patient states he left last PT session with no pain.   PERTINENT HISTORY:  Chronic LBP; bilat hamstring tightness; bilat knees; cervical disc surgery; AODM   PAIN:  Are you having pain? Yes: NPRS scale: 9/10 Pain location: LB; R  hip Pain description: dull aching in the LB; not as sharp more diffuse Aggravating factors: back - sitting > 10 min; leg - turning foot/twisting  Relieving factors: meds   PRECAUTIONS: None   WEIGHT BEARING RESTRICTIONS: No  FALLS:  Has patient fallen in last 6 months? No  PATIENT GOALS: stop most of the pain; get back to activities   OBJECTIVE:  DIAGNOSTIC FINDINGS:  IMPRESSION: 1. No acute fracture or traumatic listhesis. 2. Unchanged multilevel degenerative changes of the lumbar spine with severe spinal canal narrowing at L4-L5 and moderate to severe spinal canal narrowing at L2-L3 and L5-S1. 3. Unchanged multilevel neural foraminal narrowing, severe on the left at L4-L5 and on the right at L5-S1.  PATIENT SURVEYS:  FOTO 31; goals 48    MUSCLE LENGTH: Hamstrings: Right 70 deg; Left 65 deg Tight hip flexors bilat   POSTURE: rounded shoulders, forward head, decreased lumbar lordosis, increased thoracic kyphosis, flexed trunk , and weight shift right  PALPATION: Pain with PA mobs lumbar spine; R lumbar paraspinals; posterior hip in gluts and piriformis; hamstrings   LUMBAR ROM:  AROM eval  Flexion 70% pain bilat LB  Extension 10% pain R mid back   Right lateral flexion 65% pull L > R LB  Left lateral flexion 60% pull R LB  Right rotation 20% pain mid back  Left rotation 20% discomfort mid back    (Blank rows = not tested)  LOWER EXTREMITY ROM:    Active Assistive Right eval Left eval  Hip flexion    Hip extension 0 0  Hip abduction    Hip adduction    Hip internal rotation    Hip external rotation    Knee flexion 90 95  Knee extension -5 -5  Ankle dorsiflexion 0 0  Ankle plantarflexion    Ankle inversion    Ankle eversion     (Blank rows = not tested)  LOWER EXTREMITY MMT:   MMT Right eval Right  02/22/23 Left eval Left  02/22/23 Right 03/24/23  Hip flexion 5- 5 5 5    Hip extension 4- 4+ 5- 5-   Hip abduction 3+ 4 4+ 5 4/5  Hip adduction        Hip internal rotation       Hip external rotation       Knee flexion 5  5    Knee extension 5  5    Ankle dorsiflexion       Ankle plantarflexion       Ankle inversion       Ankle eversion        (Blank rows = not tested)  LUMBAR SPECIAL  TESTS:  Straight leg raise test: Negative and Slump test: Negative  FUNCTIONAL TESTS:  SLS: R 2 sec ; L 1 sec  5 times sit to stand: 23.5 sec  GAIT: Distance walked: 40  Assistive device utilized: None Level of assistance: Complete Independence Comments: flexed forward at trunk and hips     VESTIBULAR ASSESSMENT: 03/24/23  GENERAL OBSERVATION: Walks without device today   SYMPTOM BEHAVIOR:  Subjective history: The patient reports a h/o intermittent positional vertigo x 2-3 years. He takes meclizine as needed.  Non-Vestibular symptoms:  n/a  Type of dizziness: Spinning/Vertigo  Frequency: intermittent x seconds  Duration: seconds  Aggravating factors:  prone position to L sidelying typically provokes  Relieving factors:  moving out of the position  Progression of symptoms: unchanged  OCULOMOTOR EXAM:  Ocular Alignment: normal  Ocular ROM: No Limitations  Spontaneous Nystagmus:  TBA  Gaze-Induced Nystagmus:  TBA  Smooth Pursuits:  TBA  Saccades:  TBA   VESTIBULAR - OCULAR REFLEX:   Slow VOR: Comment: TBA  VOR Cancellation: Comment: TBA    POSITIONAL TESTING: Right Dix-Hallpike: SENSATION of dizziness could come on, no nystagmus viewed in room light Left Dix-Hallpike: no nystagmus Right Roll Test: no nystagmus Left Roll Test: no nystagmus Right Sidelying: no nystagmus Left Sidelying: no nystagmus   [facility] Adult PT Treatment:                                                DATE: 03/24/23 Therapeutic Exercise: Prone Lumbar extension x 10 reps Hip rotation PROM with knee flexed Press up with one LE off edge of mat for extension of lumbar spine + hip flexor stretch quadriped Wag the tail Cat/cow with overpressure x 5  reps Lateral rotation for thoracic spine opening Supine Hip adductor stretch bent knee fallout Standing Heel raises x 15 reps Manual Therapy: Mobilization with movement PA mobs with press ups and during quadriped activities PA mobs thoracolumbar junction and lateral mobs L1-L2 Neuromuscular re-ed: Single leg standing x 3 seconds R and L sides   [facility] Adult PT Treatment:                                                DATE: 03/22/2023 Therapeutic Exercise: Counter: L stretch HS stretch Thoracic rotation (thred the needle) Doorway spinal stretch --> sliding dowel up frame Paloff press blue TB --> horiz paloff press blue TB Cat/cow --> added RTB to isolate thoracic mobility (discontinued d/t pain) Wag the tail Prone:  Propped up on elbows --> added knee flexion Press up (lumbar extension) x10 Head down --> knee flexion x10 (B) Quadruped: Child's pose --> flat back rock back  Small range hip extension (knee float back) Stabilizing knee propped on yoga block --> opp hip rotation Figure LTR Prone press up Modalities: Supine on ice pack x 10 min   [facility] Adult PT Treatment:                                                DATE: 03/17/23 Therapeutic Exercise: Prone Lumbar extension x 10 reps, limited ROM Significant improvement  with ROM after mobiization with movement Quadriped Cat/cow x 10 reps with tactile cues Knee rotation with one leg on yoga block x 10 reps Standing Squats x 10 reps adding 10# kettle bell Lateral lunges with UE support with overpressure into adductor stretch Supine Towel roll stretch reaching overhead with towel perpendicular to thoracolumbar junction and L5 region Manual Therapy: Mobilization with movement PA mobs with press ups and during quadriped activities PA mobs thoracolumbar junction and lateral mobs L1-L2   PATIENT EDUCATION:  (none further 03/17/23) Education details: HEP  Person educated: Patient Education method: Programmer, multimedia,  Facilities manager, Actor cues, Verbal cues, and Handouts Education comprehension: verbalized understanding, returned demonstration, verbal cues required, tactile cues required, and needs further education  HOME EXERCISE PROGRAM: Access Code: [email or web address] URL: [email or web address] Date: 03/22/2023 Prepared by: Carlynn Herald  Exercises - Dead Bug  - 1 x daily - 7 x weekly - 3 sets - 10 reps - Supine Single Knee to Chest Stretch  - 1 x daily - 7 x weekly - 1 sets - 10 reps - 10-20 sec hold - Supine Lower Trunk Rotation  - 1 x daily - 7 x weekly - 3 sets - 10 reps - Quadruped Bent Leg Hip Extension  - 1 x daily - 7 x weekly - 3 sets - 10 reps - Squat with Chair Touch  - 1 x daily - 7 x weekly - 3 sets - 10 reps - Standing Lumbar Extension at Wall - Forearms  - 4-5 x daily - 7 x weekly - 2 sets - 10 reps - Supine Bridge  - 2 x daily - 7 x weekly - 1-3 sets - 10 reps - Prone Press Up  - 3-4 x daily - 7 x weekly - 2 sets - 10 reps - Split Squats Upright Trunk (Quad Bias)  - 1 x daily - 3-4 x weekly - 1-2 sets - 10 reps - Seated Sidebending  - 2 x daily - 7 x weekly - 1 sets - 3 reps - 30 sec  hold - Supine Hip Internal and External Rotation  - 1 x daily - 7 x weekly - 3 sets - 10 reps - Quadruped Thoracic Rotation Full Range with Hand on Neck  - 1 x daily - 7 x weekly - 3 sets - 10 reps - Modified Prone Quadriceps Stretch with Strap  - 1 x daily - 7 x weekly - 3 sets - 10 reps  ASSESSMENT:  CLINICAL IMPRESSION: Patient notes pain was higher intensity upon arriving today and more superior than typical. He feels soreness  is related to therapy, however, he played pickel ball and walked >9000 steps before last therapy session. PT worked on low back through exercise and pain reduced to <5/10 at end of session. The patient also was assessed for positional vertigo. PT to continue goals for low back and add further goals to address intermittent R BPPV (per subjective reports today).  Eval:  Patient is a 67 y.o. male who was seen today for physical therapy evaluation and treatment for  Spondylolisthesis at L5-S1 level and   Chronic bilateral low back pain with right-sided sciatica  .   OBJECTIVE IMPAIRMENTS: Abnormal gait, decreased activity tolerance, decreased endurance, decreased mobility, decreased ROM, hypomobility, increased fascial restrictions, increased muscle spasms, impaired flexibility, improper body mechanics, postural dysfunction, and pain.   GOALS: Goals reviewed with patient? Yes  SHORT TERM GOALS: Target date: 02/10/2023  Independent in initial HEP  Baseline: Goal status: MET  2.  Decrease LB and R LE pain by 25-50% allowing patient to return to normal activities  Baseline: R LE pain improved 80%, LBP pain no change Goal status: PARTIALLY MET   LONG TERM GOALS: Target date: 03/24/2023  Increase hamstring flexibility to ~ 75 deg bilat  Baseline:  has -20 degrees from full extension bilaterally Goal status: MET  2.  Trunk ROM/mobility WFL's and minimal to no pain  Goal status: ONGOING  3.  Increase strength R LE to 4+/5 to 5/5  Goal status:  NOT MET ON 03/24/23  4.  Patient reports return to regular activities including regular exercise - yoga and water exercises Goal status: NOT MET  5.  Independent in HEP including aquatic program as indicated  Goal status: NOT MET/ DISCHARGE GOAL-- has not done pool therapy at this time.  6.  Improve functional limitation score to 48 Goal status: ONGOING  UPDATED GOALS   LONG TERM GOALS: Target date: 04/23/23  The patient will be indep with HEP progression. Baseline:  to modify for post d/c HEP Goal status: REVISED  2.  Trunk ROM/mobility WFL's and minimal to no pain  Goal status: REVISED  3.  Increase strength R LE to 4+/5 to 5/5  Goal status:  REVISED  4.  Patient reports return to regular activities including regular exercise - yoga and water exercises Goal status: REVISED  5.  Improve  functional limitation score to 48 Goal status: REVISED  6. Patient will have no dizziness with positional testing.  Baseline: some subjective sensation of vertigo with R dix hallpike, none with R sidelying test   Goal Status: NEW  7. Patient will have single leg stance x 5 seconds R and L sides  Baseline: 2-3 seconds  Goal Status: NEW PLAN:  PT FREQUENCY: 2x/week  PT DURATION: 4 more weeks  PLANNED INTERVENTIONS: Therapeutic exercises, Therapeutic activity, Neuromuscular re-education, Balance training, Gait training, Patient/Family education, Self Care, Joint mobilization, Aquatic Therapy, Dry Needling, Electrical stimulation, Spinal mobilization, Cryotherapy, Moist heat, Taping, Traction, Ultrasound, Ionotophoresis 4mg /ml Dexamethasone, Manual therapy, and Re-evaluation.  PLAN FOR NEXT SESSION: Work on consolidating HEP to 2-3 sheets (wants dividing strength, ROM/mobility type activities).  Work on return to prior recreational activiites.  *submit re-auth in 3 visits  Uno Esau, PT 03/24/2023 9:30 PM

## 2023-04-07 ENCOUNTER — Encounter: Payer: Self-pay | Admitting: Rehabilitative and Restorative Service Providers"

## 2023-04-07 ENCOUNTER — Other Ambulatory Visit: Payer: Self-pay | Admitting: Family Medicine

## 2023-04-07 ENCOUNTER — Ambulatory Visit
Payer: Commercial Managed Care - PPO | Attending: Family Medicine | Admitting: Rehabilitative and Restorative Service Providers"

## 2023-04-07 DIAGNOSIS — M5441 Lumbago with sciatica, right side: Secondary | ICD-10-CM | POA: Diagnosis not present

## 2023-04-07 DIAGNOSIS — R29898 Other symptoms and signs involving the musculoskeletal system: Secondary | ICD-10-CM | POA: Insufficient documentation

## 2023-04-07 DIAGNOSIS — G8929 Other chronic pain: Secondary | ICD-10-CM | POA: Diagnosis not present

## 2023-04-07 DIAGNOSIS — M6281 Muscle weakness (generalized): Secondary | ICD-10-CM | POA: Diagnosis not present

## 2023-04-07 NOTE — Therapy (Addendum)
OUTPATIENT PHYSICAL THERAPY THORACOLUMBAR and VESTIBULAR TREATMENT AND ON HOLD NOTE/DISCHARGE   Patient Name: CRISTION RIBELIN MRN: [id number] DOB:1956/03/04, 67 y.o., male Today's Date: 04/07/2023   PHYSICAL THERAPY DISCHARGE SUMMARY  Visits from Start of Care: 22  Current functional level related to goals / functional outcomes: See goal status below for last known status.   Remaining deficits: Patient did not return to PT-- was consulting with neurosurgery.   Education / Equipment: HEP   Patient agrees to discharge. Patient goals were partially met. Patient is being discharged due to not returning since the last visit.  END OF SESSION:  PT End of Session - 04/07/23 1102     Visit Number 22    Number of Visits 24    Date for PT Re-Evaluation 03/24/23    Authorization Type [employer] $20 copay    Authorization - Number of Visits 21    Progress Note Due on Visit 20    PT Start Time 1102    PT Stop Time 1143    PT Time Calculation (min) 41 min    Activity Tolerance Patient tolerated treatment well    Behavior During Therapy [facility] for tasks assessed/performed               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   BPPV (benign paroxysmal positional vertigo) 08/14/2014   CAD (coronary artery disease)    2/19 PCI/DESx1 to Lcx   Cervical disc disorder with radiculopathy of cervical region 10/07/2015   Cervical radicular pain 01/10/2019   Chronic cough 04/13/2019   Decreased cardiac ejection fraction 04/02/2015   Diabetes mellitus without complication (HCC)     Dyspnea on exertion 05/15/2015   Erectile dysfunction 07/11/2019   Exercise-induced asthma    Family history of early CAD 07/05/2017   Fatigue 05/15/2015   Hallux rigidus, right foot 09/15/2021   Heart murmur    Hemarthrosis of right elbow 02/03/2016   Hoarseness 04/13/2019   Hyperlipidemia    family hx of high cholesterol   Hypogonadism in male    Incomplete rotator cuff tear 09/10/2015   Injected 09/10/2015   Lateral epicondylitis of right elbow 07/28/2019   Low back pain at multiple sites 02/05/2021   Low vitamin D level 04/22/2017   Mild persistent asthma/cough variant asthma 04/13/2019   Myofascial pain syndrome, cervical 07/28/2019   Nonischemic cardiomyopathy (HCC) 05/15/2015   Nontraumatic incomplete tear of right rotator cuff 07/25/2019   Onychomycosis 05/25/2018   Osteoarthritis    Pain in right ankle and joints of right foot 10/05/2016   Palpitations 07/06/2017   Shoulder bursitis 10/12/2014   Injected in 10/12/2014  Repeat 01/21/2015   Small thenar eminence 02/03/2016   Spinal stenosis, lumbar region, without neurogenic claudication 10/12/2014   Spondylolisthesis at L5-S1 level 05/09/2021   Strain of latissimus dorsi muscle 01/21/2015   Tendinopathy of right biceps tendon 07/25/2019   Type 2 diabetes mellitus with hyperglycemia, without long-term current use of insulin (HCC) 12/22/2013   Ulnar neuropathy at elbow of right upper extremity 01/03/2019   Past Surgical History:  Procedure Laterality Date   CORONARY PRESSURE/FFR  STUDY N/A 06/23/2017   Procedure: INTRAVASCULAR PRESSURE WIRE/FFR STUDY;  Surgeon: Marykay Lex, MD;  Location: [facility] INVASIVE CV LAB;  Service: Cardiovascular;  Laterality: N/A;   CORONARY STENT INTERVENTION N/A 06/23/2017   Procedure: CORONARY STENT INTERVENTION;  Surgeon: Marykay Lex, MD;  Location: [facility] INVASIVE CV LAB;  Service: Cardiovascular;  Laterality: N/A;   KNEE ARTHROSCOPY     LEFT HEART CATH AND CORONARY ANGIOGRAPHY N/A  06/23/2017   Procedure: LEFT HEART CATH AND CORONARY ANGIOGRAPHY;  Surgeon: Marykay Lex, MD;  Location: [facility] INVASIVE CV LAB;  Service: Cardiovascular;  Laterality: N/A;   SHOULDER ARTHROSCOPY WITH BICEPSTENOTOMY Right 09/06/2019   Procedure: SHOULDER ARTHROSCOPY WITH BICEPSTENOTOMY;  Surgeon: Tarry Kos, MD;  Location: [facility];  Service: Orthopedics;  Laterality: Right;   SHOULDER ARTHROSCOPY WITH SUBACROMIAL DECOMPRESSION Right 09/06/2019   Procedure: RIGHT SHOULDER ARTHROSCOPY WITH EXTENSIVE DEBRIDEMENT, SUBACROMIAL DECOMPRESSION, BICEPS TENOTOMY;  Surgeon: Tarry Kos, MD;  Location: [facility];  Service: Orthopedics;  Laterality: Right;   TENOTOMY ACHILLES TENDON     Patient Active Problem List   Diagnosis Date Noted   Hallux rigidus, right foot 09/15/2021   Spondylolisthesis at L5-S1 level 05/09/2021   Low back pain at multiple sites 02/05/2021   Osteoarthritis    Heart murmur    Exercise-induced asthma    Diabetes mellitus without complication (HCC)    Arthritis    Anemia    Myofascial pain syndrome, cervical 07/28/2019   Lateral epicondylitis of right elbow 07/28/2019   Nontraumatic incomplete tear of right rotator cuff 07/25/2019   Tendinopathy of right biceps tendon 07/25/2019   Erectile dysfunction 07/11/2019   Mild persistent asthma/cough variant asthma 04/13/2019   Allergic rhinitis with a possible nonallergic component 04/13/2019   Acid reflux 04/13/2019   Chronic cough 04/13/2019   Hoarseness 04/13/2019   Cervical radicular pain 01/10/2019   Ulnar neuropathy at elbow of right upper extremity 01/03/2019   Acute bilateral low back pain without sciatica 01/03/2019   Onychomycosis 05/25/2018   Palpitations 07/06/2017   CAD (coronary artery disease) 07/05/2017   Family history of early CAD 07/05/2017   Abnormal findings on diagnostic imaging of cardiovascular system 06/23/2017   Low vitamin D level 04/22/2017   Pain in right ankle  and joints of right foot 10/05/2016   Hemarthrosis of right elbow 02/03/2016   Small thenar eminence 02/03/2016   Cervical disc disorder with radiculopathy of cervical region 10/07/2015   Incomplete rotator cuff tear 09/10/2015   Nonischemic cardiomyopathy (HCC) 05/15/2015   Fatigue 05/15/2015   Dyspnea on exertion 05/15/2015   Decreased cardiac ejection fraction 04/02/2015   Strain of latissimus dorsi muscle 01/21/2015   Shoulder bursitis 10/12/2014   Spinal stenosis, lumbar region, without neurogenic claudication 10/12/2014   BPPV (benign paroxysmal positional vertigo) 08/14/2014   Hyperlipidemia 01/19/2014   Anemia, unspecified 01/19/2014   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   Type 2 diabetes mellitus with hyperglycemia, without long-term current use of insulin (HCC) 12/22/2013   Hypogonadism in male 12/22/2013   Arthritis of hand, left 12/22/2013    PCP: Dr Arva Chafe, DO REFERRING PROVIDER: Dr Rodolph Bong   REFERRING DIAG:  Spondylolisthesis at L5-S1 level  Chronic bilateral low back pain with right-sided sciatica    Rationale for Evaluation and Treatment: Rehabilitation  THERAPY DIAG:  Muscle weakness (generalized)  Chronic bilateral low back pain with right-sided sciatica  Other symptoms and signs involving the musculoskeletal system  ONSET DATE: 12/12/22  SUBJECTIVE:                                                                                                                                                                                       SUBJECTIVE STATEMENT: The patient reports he is getting pain in the anterior aspect of both legs in distal femur to anterior shins x the past week.. On the right side, it can go into the top of the foot. "I couldn't even drive my wife's car" because he had R leg pain. The low back is feeling better-- mornings are tough due to stiffness, but back is currently not stopping him during the  day. He notices more frequency in urination but not sure if it's because his legs are hurting and waking him up more. He also feels like getting up and down is getting tougher. The patient reports dizziness is improved at this time.  PERTINENT HISTORY:  Chronic LBP; bilat hamstring tightness; bilat knees (L meniscus tear and R achilles tear); cervical disc surgery; AODM,   PAIN:  Are you having pain? Yes: NPRS scale: 3/10 in back, leg pain is 7/10 (this is new pain) Pain location: LB; R hip Pain description: dull aching in the LB; not as sharp more diffuse Aggravating factors: back - sitting > 10 min; leg - turning foot/twisting  Relieving factors: meds   PRECAUTIONS: None   WEIGHT BEARING RESTRICTIONS: No  FALLS:  Has patient fallen in last 6 months? No  PATIENT GOALS: stop most of the pain; get back to activities   OBJECTIVE:  DIAGNOSTIC FINDINGS:  IMPRESSION: 1. No acute fracture or traumatic listhesis. 2. Unchanged multilevel degenerative changes of the lumbar spine with severe spinal canal narrowing at L4-L5 and moderate to severe spinal canal narrowing at L2-L3 and L5-S1. 3. Unchanged multilevel neural foraminal narrowing, severe on the left at L4-L5 and on the right at L5-S1.  PATIENT SURVEYS:  FOTO 31; goals 48    MUSCLE LENGTH: Hamstrings: Right 70 deg; Left 65 deg Tight hip flexors bilat   POSTURE: rounded shoulders, forward head, decreased lumbar lordosis, increased thoracic kyphosis, flexed trunk , and weight shift right  PALPATION: Pain with PA mobs lumbar spine; R lumbar paraspinals; posterior hip in gluts and piriformis; hamstrings   LUMBAR ROM:  AROM eval  Flexion 70% pain bilat LB  Extension 10% pain R mid back   Right lateral flexion 65% pull L > R LB  Left lateral flexion 60% pull R LB  Right rotation 20% pain mid back  Left rotation 20% discomfort mid back    (Blank rows = not tested)  LOWER EXTREMITY ROM:    Active  Assistive Right eval  Left eval  Hip flexion    Hip extension 0 0  Hip abduction    Hip adduction    Hip internal rotation    Hip external rotation    Knee flexion 90 95  Knee extension -5 -5  Ankle dorsiflexion 0 0  Ankle plantarflexion    Ankle inversion    Ankle eversion     (Blank rows = not tested)  LOWER EXTREMITY MMT:   MMT Right eval Right  02/22/23 Left eval Left  02/22/23 Right 03/24/23  Hip flexion 5- 5 5 5    Hip extension 4- 4+ 5- 5-   Hip abduction 3+ 4 4+ 5 4/5  Hip adduction       Hip internal rotation       Hip external rotation       Knee flexion 5  5    Knee extension 5  5    Ankle dorsiflexion       Ankle plantarflexion       Ankle inversion       Ankle eversion        (Blank rows = not tested)  LUMBAR SPECIAL TESTS:  Straight leg raise test: Negative and Slump test: Negative  FUNCTIONAL TESTS:  SLS: R 2 sec ; L 1 sec  5 times sit to stand: 23.5 sec  GAIT: Distance walked: 40  Assistive device utilized: None Level of assistance: Complete Independence Comments: flexed forward at trunk and hips   VESTIBULAR ASSESSMENT: 03/24/23  GENERAL OBSERVATION: Walks without device today   SYMPTOM BEHAVIOR:  Subjective history: The patient reports a h/o intermittent positional vertigo x 2-3 years. He takes meclizine as needed.  Non-Vestibular symptoms:  n/a  Type of dizziness: Spinning/Vertigo  Frequency: intermittent x seconds  Duration: seconds  Aggravating factors:  prone position to L sidelying typically provokes  Relieving factors:  moving out of the position  Progression of symptoms: unchanged  OCULOMOTOR EXAM:  Ocular Alignment: normal  Ocular ROM: No Limitations  Spontaneous Nystagmus:  TBA  Gaze-Induced Nystagmus:  TBA  Smooth Pursuits:  TBA  Saccades:  TBA   VESTIBULAR - OCULAR REFLEX:   Slow VOR: Comment: TBA  VOR Cancellation: Comment: TBA    POSITIONAL TESTING: Right Dix-Hallpike: SENSATION of dizziness could come on, no nystagmus viewed in room  light Left Dix-Hallpike: no nystagmus Right Roll Test: no nystagmus Left Roll Test: no nystagmus Right Sidelying: no nystagmus Left Sidelying: no nystagmus   [facility] Adult PT Treatment:                                                DATE: 04/07/23 Therapeutic Exercise: Reviewed HEP and consolidated for continuation while on hold Supine Single knee to chest Double knee to chest Bridges HS stretches Dead bug *see HEP for sets/reps Seated Sit<>stand Prone Gentle press ups Quad stretch Self Care: Discussed change in pain presentation with sensation of worsening weakness. PT and patient discussed last visit with MD and his clinical presentation has changed since then and referral was made at that time to neurosurgery. He has appt in January. Recommend hold until that assessment completed.    [facility] Adult PT Treatment:  DATE: 03/24/23 Therapeutic Exercise: Prone Lumbar extension x 10 reps Hip rotation PROM with knee flexed Press up with one LE off edge of mat for extension of lumbar spine + hip flexor stretch quadriped Wag the tail Cat/cow with overpressure x 5 reps Lateral rotation for thoracic spine opening Supine Hip adductor stretch bent knee fallout Standing Heel raises x 15 reps Manual Therapy: Mobilization with movement PA mobs with press ups and during quadriped activities PA mobs thoracolumbar junction and lateral mobs L1-L2 Neuromuscular re-ed: Single leg standing x 3 seconds R and L sides   [facility] Adult PT Treatment:                                                DATE: 03/22/2023 Therapeutic Exercise: Counter: L stretch HS stretch Thoracic rotation (thred the needle) Doorway spinal stretch --> sliding dowel up frame Paloff press blue TB --> horiz paloff press blue TB Cat/cow --> added RTB to isolate thoracic mobility (discontinued d/t pain) Wag the tail Prone:  Propped up on elbows --> added knee flexion Press up  (lumbar extension) x10 Head down --> knee flexion x10 (B) Quadruped: Child's pose --> flat back rock back  Small range hip extension (knee float back) Stabilizing knee propped on yoga block --> opp hip rotation Figure LTR Prone press up Modalities: Supine on ice pack x 10 min   [facility] Adult PT Treatment:                                                DATE: 03/17/23 Therapeutic Exercise: Prone Lumbar extension x 10 reps, limited ROM Significant improvement with ROM after mobiization with movement Quadriped Cat/cow x 10 reps with tactile cues Knee rotation with one leg on yoga block x 10 reps Standing Squats x 10 reps adding 10# kettle bell Lateral lunges with UE support with overpressure into adductor stretch Supine Towel roll stretch reaching overhead with towel perpendicular to thoracolumbar junction and L5 region Manual Therapy: Mobilization with movement PA mobs with press ups and during quadriped activities PA mobs thoracolumbar junction and lateral mobs L1-L2   PATIENT EDUCATION:  (none further 03/17/23) Education details: HEP  Person educated: Patient Education method: Programmer, multimedia, Facilities manager, Actor cues, Verbal cues, and Handouts Education comprehension: verbalized understanding, returned demonstration, verbal cues required, tactile cues required, and needs further education  HOME EXERCISE PROGRAM: Access Code: [email or web address] URL: [email or web address] Date: 04/07/2023 Prepared by: Margretta Ditty  Exercises - Dead Bug  - 1 x daily - 7 x weekly - 3 sets - 10 reps - Supine Hamstring Stretch  - 1 x daily - 7 x weekly - 1 sets - 2 reps - 20 seconds hold - Supine Single Knee to Chest Stretch  - 1 x daily - 7 x weekly - 1 sets - 10 reps - 10-20 sec hold - Supine Double Knee to Chest  - 1 x daily - 7 x weekly - 1 sets - 10 reps - Supine Bridge  - 1 x daily - 7 x weekly - 1-3 sets - 10 reps - Prone Press Up  - 3 x daily - 7 x weekly - 2 sets - 10 reps -  Modified Prone Quadriceps Stretch with Strap  - 1  x daily - 7 x weekly - 3 sets - 10 reps - Sit to Stand with Arm Swing  - 1 x daily - 7 x weekly - 1 sets - 10 reps  ASSESSMENT:  CLINICAL IMPRESSION: Pain is improved with movement at each session. He feels improvement with flexion double knee to chest and gentle extension in prone. PT consolidated HEP to 2 groups and recommended he continue these to tolerance while awaiting appointment with neurosurgery. Patient has consult for January. He has a change in characteristics of pain with greater leg intensity and tingling sensation. PT recommended he continue HEP and f/u with neurosurgery as scheduled. Pain improved from 7/10 at eval to 2-4/10 (2 on right and 4 on left). Pain is worse in cars with prolonged seating and he has had a reduced ability to drive his wife's car. He notes R LE pain and generalized sensation of worsening weakness.    OBJECTIVE IMPAIRMENTS: Abnormal gait, decreased activity tolerance, decreased endurance, decreased mobility, decreased ROM, hypomobility, increased fascial restrictions, increased muscle spasms, impaired flexibility, improper body mechanics, postural dysfunction, and pain.   GOALS: Goals reviewed with patient? Yes  LONG TERM GOALS: Target date: 04/23/23  The patient will be indep with HEP progression. Baseline:  to modify for post d/c HEP Goal status: REVISED  2.  Trunk ROM/mobility WFL's and minimal to no pain  Goal status: REVISED  3.  Increase strength R LE to 4+/5 to 5/5  Goal status:  REVISED  4.  Patient reports return to regular activities including regular exercise - yoga and water exercises Goal status: REVISED  5.  Improve functional limitation score to 48 Goal status: REVISED  6. Patient will have no dizziness with positional testing.  Baseline: some subjective sensation of vertigo with R dix hallpike, none with R sidelying test   Goal Status: NEW  7. Patient will have single leg stance  x 5 seconds R and L sides  Baseline: 2-3 seconds  Goal Status: NEW PLAN:  PT FREQUENCY: 2x/week  PT DURATION: 4 more weeks  PLANNED INTERVENTIONS: Therapeutic exercises, Therapeutic activity, Neuromuscular re-education, Balance training, Gait training, Patient/Family education, Self Care, Joint mobilization, Aquatic Therapy, Dry Needling, Electrical stimulation, Spinal mobilization, Cryotherapy, Moist heat, Taping, Traction, Ultrasound, Ionotophoresis 4mg /ml Dexamethasone, Manual therapy, and Re-evaluation.  PLAN FOR NEXT SESSION: on hold, awaiting further workup with neurosurgery  Anacaren Kohan, PT 04/07/2023 11:02 AM

## 2023-04-08 ENCOUNTER — Other Ambulatory Visit (HOSPITAL_BASED_OUTPATIENT_CLINIC_OR_DEPARTMENT_OTHER): Payer: Self-pay

## 2023-04-08 MED ORDER — PIOGLITAZONE HCL 45 MG PO TABS
45.0000 mg | ORAL_TABLET | Freq: Every day | ORAL | 5 refills | Status: DC
  Filled 2023-04-08: qty 30, 30d supply, fill #0
  Filled 2023-05-10: qty 30, 30d supply, fill #1
  Filled 2023-06-07: qty 30, 30d supply, fill #2
  Filled 2023-07-10: qty 30, 30d supply, fill #3
  Filled 2023-08-09 – 2023-08-13 (×2): qty 30, 30d supply, fill #4
  Filled 2023-09-12: qty 30, 30d supply, fill #5

## 2023-04-09 ENCOUNTER — Ambulatory Visit: Payer: Commercial Managed Care - PPO | Admitting: Rehabilitative and Restorative Service Providers"

## 2023-04-12 ENCOUNTER — Encounter: Payer: Commercial Managed Care - PPO | Admitting: Rehabilitative and Restorative Service Providers"

## 2023-04-13 ENCOUNTER — Other Ambulatory Visit (INDEPENDENT_AMBULATORY_CARE_PROVIDER_SITE_OTHER): Payer: Commercial Managed Care - PPO

## 2023-04-13 DIAGNOSIS — E1159 Type 2 diabetes mellitus with other circulatory complications: Secondary | ICD-10-CM | POA: Diagnosis not present

## 2023-04-13 DIAGNOSIS — I2510 Atherosclerotic heart disease of native coronary artery without angina pectoris: Secondary | ICD-10-CM | POA: Diagnosis not present

## 2023-04-13 DIAGNOSIS — E1165 Type 2 diabetes mellitus with hyperglycemia: Secondary | ICD-10-CM | POA: Diagnosis not present

## 2023-04-13 LAB — LIPID PANEL
Cholesterol: 114 mg/dL (ref 0–200)
HDL: 45 mg/dL (ref >39.00)
LDL Cholesterol: 55 mg/dL (ref 0–99)
NonHDL: 68.79
Total CHOL/HDL Ratio: 3
Triglycerides: 67 mg/dL (ref 0.0–149.0)
VLDL: 13.4 mg/dL (ref 0.0–40.0)

## 2023-04-13 LAB — HEPATIC FUNCTION PANEL
ALT: 32 U/L (ref 0–53)
AST: 25 U/L (ref 0–37)
Albumin: 4.3 g/dL (ref 3.5–5.2)
Alkaline Phosphatase: 71 U/L (ref 39–117)
Bilirubin, Direct: 0.1 mg/dL (ref 0.0–0.3)
Total Bilirubin: 0.4 mg/dL (ref 0.2–1.2)
Total Protein: 6.7 g/dL (ref 6.0–8.3)

## 2023-04-20 ENCOUNTER — Ambulatory Visit (INDEPENDENT_AMBULATORY_CARE_PROVIDER_SITE_OTHER): Payer: Commercial Managed Care - PPO

## 2023-04-20 ENCOUNTER — Other Ambulatory Visit (HOSPITAL_BASED_OUTPATIENT_CLINIC_OR_DEPARTMENT_OTHER): Payer: Self-pay

## 2023-04-20 ENCOUNTER — Ambulatory Visit: Payer: Commercial Managed Care - PPO | Admitting: Family Medicine

## 2023-04-20 ENCOUNTER — Encounter: Payer: Self-pay | Admitting: Family Medicine

## 2023-04-20 ENCOUNTER — Other Ambulatory Visit: Payer: Self-pay

## 2023-04-20 VITALS — BP 138/72 | HR 86 | Ht 70.0 in | Wt 212.0 lb

## 2023-04-20 DIAGNOSIS — M79674 Pain in right toe(s): Secondary | ICD-10-CM

## 2023-04-20 DIAGNOSIS — M7989 Other specified soft tissue disorders: Secondary | ICD-10-CM | POA: Diagnosis not present

## 2023-04-20 DIAGNOSIS — M19071 Primary osteoarthritis, right ankle and foot: Secondary | ICD-10-CM | POA: Diagnosis not present

## 2023-04-20 MED ORDER — DOXYCYCLINE HYCLATE 100 MG PO TABS
100.0000 mg | ORAL_TABLET | Freq: Two times a day (BID) | ORAL | 0 refills | Status: AC
  Filled 2023-04-20: qty 14, 7d supply, fill #0

## 2023-04-20 NOTE — Patient Instructions (Addendum)
Thank you for coming in today.   Consider a post op shoe.   Use the oxycodone as needed.   If not better we can do an injection but that could make it worse or mess up your blood sugar.   Recheck following Christmas week.   Take the doxycyline antibiotic if you are getting worse.

## 2023-04-20 NOTE — Progress Notes (Signed)
   I, Stevenson Clinch, CMA acting as a scribe for [name], MD.  Reginald Rios is a 67 y.o. male who presents to [facility] at [facility] today for R foot pain. Pt was previously seen by Dr. [name] on 02/15/23 for LBP.  Today, pt c/o R foot pain x 4 days, after sitting toe on riser. Pt locates pain to the great toe. Swelling and pain present, causing abn gait. Wife, who is a physician, is concerned for infection. Notes pain with ambulation.   Swelling: yes Aggravates: bending the toes Treatments tried: Voltaren Gel, Tylenol, ice  Dx imaging: 04/30/22 R foot XR  Pertinent review of systems: No fevers or chills  Relevant historical information: Diabetes less well-controlled currently.  History of right great toe arthritis and pain in the past.   Exam:  BP 138/72   Pulse 86   Ht 5\' 10"  (1.778 m)   Wt 212 lb (96.2 kg)   SpO2 99%   BMI 30.42 kg/m  General: Well Developed, well nourished, and in no acute distress.   MSK: Right great toe swelling at IP joint.  Tender palpation.  Decreased motion. Is mildly erythematous.  Skin itself has no induration and is not particularly tender.    Lab and Radiology Results  X-ray images right great toe obtained today personally and independently interpreted Degenerative changes present at IP joint without fracture visible. Await radiology overread    Assessment and Plan: 67 y.o. male with right great toe pain increasing after stubbed toe or contusion.  Fracture is a possibility.  I do not see a broken bone today on my view but radiology overread is pending.  If still not better in 2 weeks recommend recheck and we will repeat x-ray likely. I want to avoid a steroid injection as he has had an injury and a occult fracture will be worsened by a steroid injection now.  Plan for postop shoe and Voltaren gel and Tylenol and occasional intermittent oxycodone which he already has.  His wife who is a physician is worried about  the potential for cellulitis.  I do not think he has cellulitis now.  However he has diabetes and certainly could develop cellulitis over the next several days.  If the week before Christmas today so healthcare access will be limited next week.  I have prescribed doxycycline that he can start taking if his symptoms are significantly worsening or his wife thinks that he is developing cellulitis.  Recheck with me in about 2 weeks.   PDMP reviewed during this encounter. Orders Placed This Encounter  Procedures   DG Toe Great Right    Standing Status:   Future    Number of Occurrences:   1    Expiration Date:   05/21/2023    Reason for Exam (SYMPTOM  OR DIAGNOSIS REQUIRED):   right big toe pain    Preferred imaging location?:   [facility]   Meds ordered this encounter  Medications   doxycycline (VIBRA-TABS) 100 MG tablet    Sig: Take 1 tablet (100 mg total) by mouth 2 (two) times daily for 7 days.    Dispense:  14 tablet    Refill:  0     Discussed warning signs or symptoms. Please see discharge instructions. Patient expresses understanding.   The above documentation has been reviewed and is accurate and complete [name], M.D.

## 2023-05-03 ENCOUNTER — Ambulatory Visit: Payer: Commercial Managed Care - PPO | Admitting: Family Medicine

## 2023-05-03 ENCOUNTER — Encounter: Payer: Self-pay | Admitting: Family Medicine

## 2023-05-03 VITALS — BP 116/76 | HR 83 | Ht 70.0 in | Wt 212.0 lb

## 2023-05-03 DIAGNOSIS — M79674 Pain in right toe(s): Secondary | ICD-10-CM | POA: Diagnosis not present

## 2023-05-03 NOTE — Progress Notes (Signed)
Big toe x-ray shows no fractures.  There is arthritis.

## 2023-05-03 NOTE — Progress Notes (Signed)
   Rubin Payor, PhD, LAT, ATC acting as a scribe for Reginald Graham, MD.  Reginald Rios is a 67 y.o. male who presents to [facility] at [facility] today for f/u R Great toe pain. Pt was last seen by Dr. [name] on 04/20/23 and was prescribed doxycycline, advised to use Voltaren gel, post-op shoe, Tylenol, and intermittent oxycodone.   Today, pt reports Great toe is still sore and swollen. Increased pain walking barefoot. He hasn't been playing pickleball lately.   Dx imaging: 04/20/23 R Great toe XR 04/30/22 R foot XR   Pertinent review of systems: No fevers or chills  Relevant historical information: Diabetes Heart disease with drug-eluding stent  Exam:  BP 116/76   Pulse 83   Ht 5\' 10"  (1.778 m)   Wt 212 lb (96.2 kg)   SpO2 97%   BMI 30.42 kg/m  General: Well Developed, well nourished, and in no acute distress.   MSK: Right great toe some swelling present mostly IP joint.  Decreased range of motion at IP and MCP.    Lab and Radiology Results  EXAM: RIGHT GREAT TOE   COMPARISON:  None Available.   FINDINGS: No evidence of acute fracture or dislocation. Moderate to advanced osteoarthritis of the great toe interphalangeal joint with joint space narrowing, osteophytes and subchondral cystic change. Mild to moderate osteoarthritis of the first metatarsal phalangeal joint. No erosive or bony destructive change. Adjacent digits are intact. There is mild soft tissue edema of the digit. No soft tissue gas or radiopaque foreign body.   IMPRESSION: 1. No acute fracture or subluxation of the great toe. 2. Osteoarthritis of the great toe, greatest at the interphalangeal joint.     Electronically Signed   By: Narda Rutherford M.D.   On: 05/01/2023 22:04 I, Reginald Rios, personally (independently) visualized and performed the interpretation of the images attached in this note.     Assessment and Plan: 67 y.o. male with right great toe pain due to  exacerbation of arthritis and contusion.  Plan for bit of watchful waiting and conservative management.  If not better in another month would consider steroid injection.   PDMP not reviewed this encounter. No orders of the defined types were placed in this encounter.  No orders of the defined types were placed in this encounter.    Discussed warning signs or symptoms. Please see discharge instructions. Patient expresses understanding.   The above documentation has been reviewed and is accurate and complete Reginald Rios, M.D.

## 2023-05-03 NOTE — Patient Instructions (Signed)
Thank you for coming in today.   I will do a shot if I need to.   Give it a month.   Continue pensaid or voltaren gel.   Use the turf toe insole.

## 2023-05-10 ENCOUNTER — Other Ambulatory Visit: Payer: Self-pay

## 2023-05-11 ENCOUNTER — Other Ambulatory Visit: Payer: Self-pay

## 2023-05-11 ENCOUNTER — Other Ambulatory Visit (HOSPITAL_BASED_OUTPATIENT_CLINIC_OR_DEPARTMENT_OTHER): Payer: Self-pay

## 2023-05-11 ENCOUNTER — Ambulatory Visit: Payer: Commercial Managed Care - PPO | Admitting: Family Medicine

## 2023-05-11 ENCOUNTER — Encounter: Payer: Self-pay | Admitting: Family Medicine

## 2023-05-11 VITALS — BP 136/78 | HR 94 | Temp 98.0°F | Resp 16 | Ht 70.0 in | Wt 212.4 lb

## 2023-05-11 DIAGNOSIS — Z7984 Long term (current) use of oral hypoglycemic drugs: Secondary | ICD-10-CM

## 2023-05-11 DIAGNOSIS — E119 Type 2 diabetes mellitus without complications: Secondary | ICD-10-CM

## 2023-05-11 MED ORDER — GLYBURIDE 2.5 MG PO TABS: ORAL_TABLET | ORAL | Status: DC

## 2023-05-11 MED ORDER — FLUTICASONE PROPIONATE 50 MCG/ACT NA SUSP
2.0000 | Freq: Every day | NASAL | 6 refills | Status: DC
  Filled 2023-05-11: qty 16, 30d supply, fill #0
  Filled 2023-07-28: qty 16, 30d supply, fill #1
  Filled 2023-08-27: qty 16, 30d supply, fill #2
  Filled 2023-10-04: qty 16, 30d supply, fill #3
  Filled 2023-11-10: qty 16, 30d supply, fill #4
  Filled 2024-01-03: qty 16, 30d supply, fill #5
  Filled 2024-04-06: qty 16, 30d supply, fill #6

## 2023-05-11 NOTE — Progress Notes (Signed)
 Subjective:   Chief Complaint  Patient presents with   Follow-up    3 month    Reginald Rios is a 68 y.o. male here for follow-up of diabetes.   Reginald Rios's self monitored glucose range is 200's.  Patient denies hypoglycemic reactions. He checks his glucose levels 1 time(s) per day. Patient does not require insulin .   Medications include: metformin  1000 mg bid, Actos  45 mg/d He had adverse effects with Rybelsus  and Ozempic .  Did not tolerate Farxiga .  He was started on glyburide  5 mg twice daily but had hypoglycemia.  Reports twice daily dosing of glyburide  2.5 mg was a bit too much, he would like to try once daily. Was started on SU but he was dropping.  Diet is OK.  Exercise: walking No CP or SOB.   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   BPPV (benign paroxysmal positional vertigo) 08/14/2014   CAD (coronary artery disease)    2/19 PCI/DESx1 to Lcx   Cervical disc disorder with radiculopathy of cervical region 10/07/2015   Cervical radicular pain 01/10/2019   Chronic cough 04/13/2019   Decreased cardiac ejection fraction 04/02/2015   Diabetes mellitus without complication (HCC)    Dyspnea on exertion 05/15/2015   Erectile dysfunction 07/11/2019   Exercise-induced asthma    Family history of early CAD 07/05/2017   Fatigue 05/15/2015   Hallux rigidus, right foot 09/15/2021   Heart murmur    Hemarthrosis of right elbow 02/03/2016   Hoarseness 04/13/2019   Hyperlipidemia    family hx of high cholesterol   Hypogonadism in male    Incomplete rotator cuff tear 09/10/2015   Injected  09/10/2015   Lateral epicondylitis of right elbow 07/28/2019   Low back pain at multiple sites 02/05/2021   Low vitamin D  level 04/22/2017   Mild persistent asthma/cough variant asthma 04/13/2019   Myofascial pain syndrome, cervical 07/28/2019   Nonischemic cardiomyopathy (HCC) 05/15/2015   Nontraumatic incomplete tear of right rotator cuff 07/25/2019   Onychomycosis 05/25/2018   Osteoarthritis    Pain in right ankle and joints of right foot 10/05/2016   Palpitations 07/06/2017   Shoulder bursitis 10/12/2014   Injected in 10/12/2014  Repeat 01/21/2015   Small thenar eminence 02/03/2016   Spinal stenosis, lumbar region, without neurogenic claudication 10/12/2014   Spondylolisthesis at L5-S1 level 05/09/2021   Strain of latissimus dorsi muscle 01/21/2015   Tendinopathy of right biceps tendon 07/25/2019   Type 2 diabetes mellitus with hyperglycemia, without long-term current use of insulin  (HCC) 12/22/2013   Ulnar neuropathy at elbow of right upper extremity 01/03/2019     Related testing: Retinal exam: Done Pneumovax: done  Objective:  BP 136/78   Pulse 94   Temp 98 F (36.7 C) (Oral)   Resp 16   Ht 5' 10 (1.778 m)   Wt 212 lb 6.4 oz (96.3 kg)   SpO2 98%   BMI 30.48 kg/m  General:  Well developed, well nourished, in no apparent distress Lungs:  CTAB, no access msc use Cardio:  RRR, no bruits, no LE edema Psych: Age appropriate judgment and insight  Assessment:  Diabetes mellitus without complication (HCC) - Plan: glyBURIDE  (DIABETA ) 2.5 MG tablet  Need for shingles vaccine   Plan:   Chronic, uncontrolled.  Continue metformin  1000 mg twice daily, Actos  45 mg daily.  Add glyburide  2.5 mg daily.  Send message with readings in 1 month.  He has had a referral to endocrinology placed.  Keep that for now.  Counseled on diet and exercise. F/u in 3 mo. The patient voiced understanding and agreement to the plan.  Mabel Mt Crystal, DO 05/11/23 2:21 PM

## 2023-05-11 NOTE — Patient Instructions (Addendum)
 Start taking the glyburide once daily again. Continue monitoring your sugars. Send me some sugar readings in 1 month.   Keep the diet clean and stay active.  Let us know if you need anything.

## 2023-05-14 ENCOUNTER — Encounter: Payer: Self-pay | Admitting: Family Medicine

## 2023-05-14 DIAGNOSIS — Z6830 Body mass index (BMI) 30.0-30.9, adult: Secondary | ICD-10-CM | POA: Diagnosis not present

## 2023-05-14 DIAGNOSIS — M4317 Spondylolisthesis, lumbosacral region: Secondary | ICD-10-CM | POA: Diagnosis not present

## 2023-05-27 ENCOUNTER — Telehealth: Payer: Self-pay

## 2023-05-27 ENCOUNTER — Telehealth: Payer: Self-pay | Admitting: *Deleted

## 2023-05-27 NOTE — Telephone Encounter (Signed)
Pt has been added on ok per ED, NP due to med hold and date of procedure. Med rec and consent are done.

## 2023-05-27 NOTE — Telephone Encounter (Signed)
   [facility] Pre-operative Risk Assessment    Request for surgical clearance:  What type of surgery is being performed? Lumbar Spine Injection   When is this surgery scheduled? 06/14/2023   What type of clearance is required (medical clearance vs. Pharmacy clearance to hold med vs. Both)? Both   Are there any medications that need to be held prior to surgery and how long? Plavix 7 days prior    Practice name and name of physician performing surgery? Dr. Barnett Abu with [facility]    What is your office phone number [phone] ext [phone]    7.   What is your office fax number [phone]  8.   Anesthesia type (None, local, MAC, general) ? Type is not indicated, just says "sedation"    [place] Reginald Rios 05/27/2023, 4:25 PM  _________________________________________________________________   (provider comments below)

## 2023-05-27 NOTE — Telephone Encounter (Signed)
Pt has been added on ok per ED, NP due to med hold and date of procedure. Med rec and consent are done.     Patient Consent for Virtual Visit        Reginald Rios has provided verbal consent on 05/27/2023 for a virtual visit (video or telephone).   CONSENT FOR VIRTUAL VISIT FOR:  Reginald Rios  By participating in this virtual visit I agree to the following:  I hereby voluntarily request, consent and authorize [facility] and its employed or contracted physicians, physician assistants, nurse practitioners or other licensed health care professionals (the Practitioner), to provide me with telemedicine health care services (the "Services") as deemed necessary by the treating Practitioner. I acknowledge and consent to receive the Services by the Practitioner via telemedicine. I understand that the telemedicine visit will involve communicating with the Practitioner through live audiovisual communication technology and the disclosure of certain medical information by electronic transmission. I acknowledge that I have been given the opportunity to request an in-person assessment or other available alternative prior to the telemedicine visit and am voluntarily participating in the telemedicine visit.  I understand that I have the right to withhold or withdraw my consent to the use of telemedicine in the course of my care at any time, without affecting my right to future care or treatment, and that the Practitioner or I may terminate the telemedicine visit at any time. I understand that I have the right to inspect all information obtained and/or recorded in the course of the telemedicine visit and may receive copies of available information for a reasonable fee.  I understand that some of the potential risks of receiving the Services via telemedicine include:  Delay or interruption in medical evaluation due to technological equipment failure or disruption; Information transmitted may not be  sufficient (e.g. poor resolution of images) to allow for appropriate medical decision making by the Practitioner; and/or  In rare instances, security protocols could fail, causing a breach of personal health information.  Furthermore, I acknowledge that it is my responsibility to provide information about my medical history, conditions and care that is complete and accurate to the best of my ability. I acknowledge that Practitioner's advice, recommendations, and/or decision may be based on factors not within their control, such as incomplete or inaccurate data provided by me or distortions of diagnostic images or specimens that may result from electronic transmissions. I understand that the practice of medicine is not an exact science and that Practitioner makes no warranties or guarantees regarding treatment outcomes. I acknowledge that a copy of this consent can be made available to me via my patient portal [facility] MyChart), or I can request a printed copy by calling the office of [facility].    I understand that my insurance will be billed for this visit.   I have read or had this consent read to me. I understand the contents of this consent, which adequately explains the benefits and risks of the Services being provided via telemedicine.  I have been provided ample opportunity to ask questions regarding this consent and the Services and have had my questions answered to my satisfaction. I give my informed consent for the services to be provided through the use of telemedicine in my medical care

## 2023-05-27 NOTE — Telephone Encounter (Signed)
   Name: Reginald Rios  DOB: [birth year]  MRN: [id number]  Primary Cardiologist: Norman Herrlich, MD   Preoperative team, please contact this patient and set up a phone call appointment for further preoperative risk assessment. Please obtain consent and complete medication review. Thank you for your help.  I confirm that guidance regarding antiplatelet and oral anticoagulation therapy has been completed and, if necessary, noted below.  Per office protocol, he may hold Plavix for 5 days prior to procedure. Please resume Plavix as soon as possible postprocedure, at the discretion of the surgeon.   I also confirmed the patient resides in the state of [place]. As per [facility] Medical Board telemedicine laws, the patient must reside in the state in which the provider is licensed.   Napoleon Form, Leodis Rains, NP 05/27/2023, 4:49 PM [facility]

## 2023-05-28 ENCOUNTER — Other Ambulatory Visit (HOSPITAL_BASED_OUTPATIENT_CLINIC_OR_DEPARTMENT_OTHER): Payer: Self-pay

## 2023-05-28 ENCOUNTER — Other Ambulatory Visit: Payer: Self-pay | Admitting: Family Medicine

## 2023-05-28 ENCOUNTER — Other Ambulatory Visit: Payer: Self-pay

## 2023-05-28 DIAGNOSIS — K219 Gastro-esophageal reflux disease without esophagitis: Secondary | ICD-10-CM

## 2023-05-31 ENCOUNTER — Other Ambulatory Visit (HOSPITAL_BASED_OUTPATIENT_CLINIC_OR_DEPARTMENT_OTHER): Payer: Self-pay

## 2023-05-31 MED ORDER — PANTOPRAZOLE SODIUM 40 MG PO TBEC
40.0000 mg | DELAYED_RELEASE_TABLET | Freq: Every day | ORAL | 3 refills | Status: DC
  Filled 2023-05-31: qty 30, 30d supply, fill #0
  Filled 2023-07-10: qty 30, 30d supply, fill #1
  Filled 2023-08-09 – 2023-08-13 (×2): qty 30, 30d supply, fill #2
  Filled 2023-08-31 – 2023-09-12 (×2): qty 30, 30d supply, fill #3

## 2023-06-03 ENCOUNTER — Ambulatory Visit: Payer: Commercial Managed Care - PPO | Attending: Cardiology

## 2023-06-03 DIAGNOSIS — Z01810 Encounter for preprocedural cardiovascular examination: Secondary | ICD-10-CM

## 2023-06-03 NOTE — Progress Notes (Signed)
Virtual Visit via Telephone Note   Because of Reginald Rios's co-morbid illnesses, he is at least at moderate risk for complications without adequate follow up.  This format is felt to be most appropriate for this patient at this time.  The patient did not have access to video technology/had technical difficulties with video requiring transitioning to audio format only (telephone).  All issues noted in this document were discussed and addressed.  No physical exam could be performed with this format.  Please refer to the patient's chart for his consent to telehealth for [facility].  Evaluation Performed:  Preoperative cardiovascular risk assessment _____________   Date:  06/03/2023   Patient ID:  Reginald Rios, DOB [birth year], MRN [id number] Patient Location:  Home Provider location:   Office  Primary Care Provider:  Sharlene Dory, DO Primary Cardiologist:  Norman Herrlich, MD  Chief Complaint / Patient Profile   68 y.o. y/o male with a h/o coronary artery disease, diabetes, hyperlipidemia who is pending  Lumbar Spine Injection  and presents today for telephonic preoperative cardiovascular risk assessment.  History of Present Illness    Reginald Rios is a 68 y.o. male who presents via audio/video conferencing for a telehealth visit today.  Pt was last seen in cardiology clinic on 12/31/2022 by Dr. Tomie China.  At that time Reginald Rios was doing well .  The patient is now pending procedure as outlined above. Since his last visit, he remains stable from a cardiac standpoint.  Today he denies chest pain, shortness of breath, lower extremity edema, fatigue, palpitations, melena, hematuria, hemoptysis, diaphoresis, weakness, presyncope, syncope, orthopnea, and PND.   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   BPPV (benign paroxysmal positional vertigo) 08/14/2014   CAD (coronary artery disease)    2/19 PCI/DESx1 to Lcx   Cervical disc disorder with radiculopathy of cervical region 10/07/2015   Cervical radicular pain 01/10/2019   Chronic cough 04/13/2019   Decreased cardiac ejection fraction 04/02/2015   Diabetes mellitus without complication (HCC)    Dyspnea on exertion 05/15/2015   Erectile dysfunction 07/11/2019   Exercise-induced asthma    Family history of early CAD 07/05/2017   Fatigue 05/15/2015   Hallux rigidus, right foot 09/15/2021   Heart murmur    Hemarthrosis of right elbow 02/03/2016   Hoarseness 04/13/2019   Hyperlipidemia    family hx of high cholesterol   Hypogonadism in male    Incomplete rotator cuff tear 09/10/2015   Injected 09/10/2015   Lateral epicondylitis of right elbow 07/28/2019   Low back pain at multiple sites 02/05/2021   Low vitamin D level 04/22/2017   Mild persistent asthma/cough variant asthma 04/13/2019   Myofascial pain syndrome, cervical 07/28/2019   Nonischemic cardiomyopathy (HCC) 05/15/2015   Nontraumatic incomplete tear of right rotator cuff 07/25/2019   Onychomycosis 05/25/2018   Osteoarthritis    Pain in right ankle and joints of right foot 10/05/2016   Palpitations 07/06/2017   Shoulder bursitis 10/12/2014   Injected in 10/12/2014  Repeat 01/21/2015   Small thenar eminence 02/03/2016  Spinal stenosis, lumbar region, without neurogenic claudication 10/12/2014   Spondylolisthesis at L5-S1 level 05/09/2021   Strain of latissimus dorsi muscle 01/21/2015   Tendinopathy of right biceps tendon 07/25/2019   Type 2 diabetes mellitus with hyperglycemia, without long-term  current use of insulin (HCC) 12/22/2013   Ulnar neuropathy at elbow of right upper extremity 01/03/2019   Past Surgical History:  Procedure Laterality Date   CORONARY PRESSURE/FFR STUDY N/A 06/23/2017   Procedure: INTRAVASCULAR PRESSURE WIRE/FFR STUDY;  Surgeon: Marykay Lex, MD;  Location: [facility] INVASIVE CV LAB;  Service: Cardiovascular;  Laterality: N/A;   CORONARY STENT INTERVENTION N/A 06/23/2017   Procedure: CORONARY STENT INTERVENTION;  Surgeon: Marykay Lex, MD;  Location: [facility] INVASIVE CV LAB;  Service: Cardiovascular;  Laterality: N/A;   KNEE ARTHROSCOPY     LEFT HEART CATH AND CORONARY ANGIOGRAPHY N/A 06/23/2017   Procedure: LEFT HEART CATH AND CORONARY ANGIOGRAPHY;  Surgeon: Marykay Lex, MD;  Location: [facility] INVASIVE CV LAB;  Service: Cardiovascular;  Laterality: N/A;   SHOULDER ARTHROSCOPY WITH BICEPSTENOTOMY Right 09/06/2019   Procedure: SHOULDER ARTHROSCOPY WITH BICEPSTENOTOMY;  Surgeon: Tarry Kos, MD;  Location: [facility];  Service: Orthopedics;  Laterality: Right;   SHOULDER ARTHROSCOPY WITH SUBACROMIAL DECOMPRESSION Right 09/06/2019   Procedure: RIGHT SHOULDER ARTHROSCOPY WITH EXTENSIVE DEBRIDEMENT, SUBACROMIAL DECOMPRESSION, BICEPS TENOTOMY;  Surgeon: Tarry Kos, MD;  Location: [facility];  Service: Orthopedics;  Laterality: Right;   TENOTOMY ACHILLES TENDON      Allergies  Allergies  Allergen Reactions   Invokana [Canagliflozin] Rash   Grass Pollen(K-O-R-T-Swt Vern)    Lisinopril Cough   Semaglutide Nausea Only    Patient is intolerant to Rybelsus    Home Medications    Prior to Admission medications   Medication Sig Start Date End Date Taking? Authorizing Provider  albuterol (VENTOLIN HFA) 108 (90 Base) MCG/ACT inhaler Inhale 2 puffs by mouth every 4-6 hours as needed for coughing or wheezing spells 02/03/23   Verlee Monte, MD  azelastine (ASTELIN) 0.1 % nasal spray Place 2 sprays into both nostrils 2 (two) times daily as  needed for rhinitis (nasal congestion). 11/24/22   [name], MD  baclofen (LIORESAL) 10 MG tablet Take 1 tablet (10 mg total) by mouth 3 (three) times daily as needed for muscle spasms. 08/31/22   Rodolph Bong, MD  Blood Glucose Monitoring Suppl (FREESTYLE FREEDOM LITE) w/Device KIT Use to check blood sugar daily. 05/26/19   Sharlene Dory, DO  Calcium Carbonate-Vit D-Min (QC CALCIUM-MAGNESIUM-ZINC-D3 PO) Take 1 tablet by mouth at bedtime.    [provider]  diclofenac Sodium (VOLTAREN) 1 % GEL Apply 2 g topically 4 (four) times daily. 07/24/20   Tarry Kos, MD  dicyclomine (BENTYL) 10 MG capsule Take 1 capsule (10 mg total) by mouth every 6 (six) hours as needed for abdominal cramping. 03/23/23   Sharlene Dory, DO  ezetimibe (ZETIA) 10 MG tablet Take 1 tablet (10 mg total) by mouth daily. 03/01/23 02/29/24  Revankar, Aundra Dubin, MD  fluticasone (FLONASE) 50 MCG/ACT nasal spray Place 2 sprays into both nostrils daily. 05/11/23   Sharlene Dory, DO  fluticasone (FLOVENT HFA) 110 MCG/ACT inhaler Inhale 2 puffs into the lungs 2 (two) times daily. Use with spacer. Rinse, gargle, and spit after use. 11/24/22   [name], MD  Glucagon (GVOKE HYPOPEN 1-PACK) 1 MG/0.2ML SOAJ Inject into the skin in the event of low sugar. 03/18/23   Sharlene Dory, DO  glucose blood (FREESTYLE LITE) test strip Use daily to check blood sugar. 03/23/23   Sharlene Dory, DO  glyBURIDE (DIABETA) 2.5 MG tablet Take 1 tab daily. 05/11/23   Sharlene Dory, DO  Lancets (FREESTYLE) lancets Use daily to check blood sugar 05/26/19   Wendling, Jilda Roche, DO  losartan (COZAAR) 25 MG tablet Take 1 tablet (25 mg total) by mouth daily. 02/08/23 08/17/23  Revankar, Aundra Dubin, MD  meclizine (ANTIVERT) 25 MG tablet Take 1 tablet (25 mg total) by mouth 3 (three) times daily as needed for dizziness. 01/26/23   Sharlene Dory, DO  metFORMIN (GLUCOPHAGE) 1000 MG tablet  Take 1 tablet (1,000 mg total) by mouth 2 (two) times daily with a meal. 06/01/22 06/21/23  Wendling, Jilda Roche, DO  methylphenidate (RITALIN) 20 MG tablet Take 1 tablet (20 mg total) by mouth 2 (two) times daily. 04/22/23   Sharlene Dory, DO  methylphenidate (RITALIN) 20 MG tablet Take 1 tablet (20 mg total) by mouth 2 (two) times daily. 03/23/23   Sharlene Dory, DO  methylphenidate (RITALIN) 20 MG tablet Take 1 tablet (20 mg total) by mouth 2 (two) times daily. 05/22/23   Sharlene Dory, DO  montelukast (SINGULAIR) 10 MG tablet Take 1 tablet (10 mg total) by mouth daily. 08/26/22   Sharlene Dory, DO  Multiple Vitamin (MULTIVITAMIN) tablet Take 1 tablet by mouth daily.    [provider]  nitroGLYCERIN (NITROSTAT) 0.4 MG SL tablet Place 0.4 mg under the tongue every 5 (five) minutes as needed for chest pain.    [provider]  pantoprazole (PROTONIX) 40 MG tablet Take 1 tablet (40 mg total) by mouth daily. 05/31/23   Sharlene Dory, DO  pioglitazone (ACTOS) 45 MG tablet Take 1 tablet (45 mg total) by mouth daily. 04/08/23   Sharlene Dory, DO  prednisoLONE acetate (PRED FORTE) 1 % ophthalmic suspension Place 1 drop into the left eye 2 (two) times daily. Use for 5 days after laser procedure 11/04/22     rosuvastatin (CRESTOR) 5 MG tablet Take 1 tablet (5 mg total) by mouth daily. 03/01/23   Sharlene Dory, DO  sildenafil (VIAGRA) 100 MG tablet Take 1 tablet (100 mg total) by mouth daily as needed. Take 1 hour prior to sexual activity 02/05/23     tadalafil (CIALIS) 20 MG tablet Take 0.5-1 tablets (10-20 mg total) by mouth every other day as needed for erectile dysfunction. 07/11/19   Sharlene Dory, DO  testosterone (ANDROGEL) 50 MG/5GM (1%) GEL APPLY 2 APPLICATIONS ON TO THE SKIN DAILY 03/23/23 09/19/23  Sharlene Dory, DO    Physical Exam    Vital Signs:  Reginald Rios does not have vital signs  available for review today.  Given telephonic nature of communication, physical exam is limited. AAOx3. NAD. Normal affect.  Speech and respirations are unlabored.  Accessory Clinical Findings    None  Assessment & Plan    1.  Preoperative Cardiovascular Risk Assessment: Lumbar spine injection, 06/14/2023, Dr. Barnett Abu, neurosurgery and spine, fax #[phone]      Primary Cardiologist: Norman Herrlich, MD  Chart reviewed as part of pre-operative protocol coverage. Given past medical history and time since last visit, based on ACC/AHA guidelines, GONSALO CUTHBERTSON would be at acceptable risk for the planned procedure without further cardiovascular testing.   His Plavix may be held for 5 days prior to his procedure.  Please resume as soon as hemostasis is achieved.  Patient was advised that if he develops new symptoms prior to surgery to contact our office to arrange a follow-up appointment.  He verbalized understanding.  I will route this recommendation to the requesting party via Epic fax function and remove from pre-op pool.       Time:   Today, I have spent 7 minutes with the patient with telehealth technology discussing medical history, symptoms, and management plan.     Ronney Asters, NP  06/03/2023, 7:03 AM

## 2023-06-08 ENCOUNTER — Other Ambulatory Visit (HOSPITAL_BASED_OUTPATIENT_CLINIC_OR_DEPARTMENT_OTHER): Payer: Self-pay

## 2023-06-08 ENCOUNTER — Other Ambulatory Visit: Payer: Self-pay

## 2023-06-11 ENCOUNTER — Other Ambulatory Visit (HOSPITAL_BASED_OUTPATIENT_CLINIC_OR_DEPARTMENT_OTHER): Payer: Self-pay

## 2023-06-11 ENCOUNTER — Encounter: Payer: Self-pay | Admitting: Cardiology

## 2023-06-14 ENCOUNTER — Other Ambulatory Visit (HOSPITAL_BASED_OUTPATIENT_CLINIC_OR_DEPARTMENT_OTHER): Payer: Self-pay

## 2023-06-14 DIAGNOSIS — M5116 Intervertebral disc disorders with radiculopathy, lumbar region: Secondary | ICD-10-CM | POA: Diagnosis not present

## 2023-06-14 DIAGNOSIS — M5416 Radiculopathy, lumbar region: Secondary | ICD-10-CM | POA: Diagnosis not present

## 2023-06-14 MED ORDER — CLOPIDOGREL BISULFATE 75 MG PO TABS
75.0000 mg | ORAL_TABLET | Freq: Every day | ORAL | 2 refills | Status: DC
  Filled 2023-06-14: qty 90, 90d supply, fill #0
  Filled 2023-08-31: qty 90, 90d supply, fill #1
  Filled 2023-12-06: qty 90, 90d supply, fill #2

## 2023-06-15 ENCOUNTER — Other Ambulatory Visit (HOSPITAL_BASED_OUTPATIENT_CLINIC_OR_DEPARTMENT_OTHER): Payer: Self-pay

## 2023-06-25 ENCOUNTER — Other Ambulatory Visit: Payer: Self-pay | Admitting: Family Medicine

## 2023-06-25 ENCOUNTER — Other Ambulatory Visit (HOSPITAL_BASED_OUTPATIENT_CLINIC_OR_DEPARTMENT_OTHER): Payer: Self-pay

## 2023-06-25 DIAGNOSIS — E119 Type 2 diabetes mellitus without complications: Secondary | ICD-10-CM

## 2023-06-25 DIAGNOSIS — E1165 Type 2 diabetes mellitus with hyperglycemia: Secondary | ICD-10-CM

## 2023-06-25 MED ORDER — METFORMIN HCL 1000 MG PO TABS
1000.0000 mg | ORAL_TABLET | Freq: Two times a day (BID) | ORAL | 0 refills | Status: DC
  Filled 2023-06-25: qty 180, 90d supply, fill #0

## 2023-06-25 MED ORDER — GLYBURIDE 2.5 MG PO TABS: 2.5000 mg | ORAL_TABLET | Freq: Every day | ORAL | 0 refills | Status: DC

## 2023-06-28 ENCOUNTER — Other Ambulatory Visit: Payer: Self-pay

## 2023-06-28 ENCOUNTER — Other Ambulatory Visit (HOSPITAL_BASED_OUTPATIENT_CLINIC_OR_DEPARTMENT_OTHER): Payer: Self-pay

## 2023-07-01 ENCOUNTER — Encounter: Payer: Self-pay | Admitting: Family Medicine

## 2023-07-01 ENCOUNTER — Other Ambulatory Visit (HOSPITAL_BASED_OUTPATIENT_CLINIC_OR_DEPARTMENT_OTHER): Payer: Self-pay

## 2023-07-01 ENCOUNTER — Other Ambulatory Visit: Payer: Self-pay

## 2023-07-01 DIAGNOSIS — E162 Hypoglycemia, unspecified: Secondary | ICD-10-CM

## 2023-07-05 ENCOUNTER — Encounter: Payer: Self-pay | Admitting: Family Medicine

## 2023-07-05 ENCOUNTER — Telehealth: Payer: Self-pay

## 2023-07-05 ENCOUNTER — Ambulatory Visit: Admitting: Family Medicine

## 2023-07-05 ENCOUNTER — Other Ambulatory Visit: Payer: Commercial Managed Care - PPO

## 2023-07-05 ENCOUNTER — Ambulatory Visit (HOSPITAL_BASED_OUTPATIENT_CLINIC_OR_DEPARTMENT_OTHER)
Admission: RE | Admit: 2023-07-05 | Discharge: 2023-07-05 | Disposition: A | Discharge status: Home / Self Care | Source: Ambulatory Visit | Attending: Family Medicine | Admitting: Family Medicine

## 2023-07-05 VITALS — BP 110/60 | HR 76 | Temp 97.5°F | Resp 18 | Ht 70.0 in | Wt 209.6 lb

## 2023-07-05 DIAGNOSIS — Z981 Arthrodesis status: Secondary | ICD-10-CM

## 2023-07-05 DIAGNOSIS — R0602 Shortness of breath: Secondary | ICD-10-CM | POA: Insufficient documentation

## 2023-07-05 DIAGNOSIS — R42 Dizziness and giddiness: Secondary | ICD-10-CM

## 2023-07-05 DIAGNOSIS — M4319 Spondylolisthesis, multiple sites in spine: Secondary | ICD-10-CM | POA: Diagnosis not present

## 2023-07-05 DIAGNOSIS — M5030 Other cervical disc degeneration, unspecified cervical region: Secondary | ICD-10-CM | POA: Diagnosis not present

## 2023-07-05 DIAGNOSIS — Z4789 Encounter for other orthopedic aftercare: Secondary | ICD-10-CM | POA: Diagnosis not present

## 2023-07-05 DIAGNOSIS — M4802 Spinal stenosis, cervical region: Secondary | ICD-10-CM | POA: Diagnosis not present

## 2023-07-05 DIAGNOSIS — E162 Hypoglycemia, unspecified: Secondary | ICD-10-CM | POA: Diagnosis not present

## 2023-07-05 NOTE — Progress Notes (Unsigned)
 Established Patient Office Visit  Subjective   Patient ID: Reginald Rios, male    DOB: 1955-07-21  Age: 68 y.o. MRN: [id number]  Chief Complaint  Patient presents with  . Dizziness    Pt states having dizziness and SOB, x1 month, but states getting body aches and headache within the last 2 days.     HPI Discussed the use of AI scribe software for clinical note transcription with the patient, who gave verbal consent to proceed.  History of Present Illness   ADON GEHLHAUSEN is a 68 year old male with asthma, diabetes, and a history of back pain who presents with shortness of breath and fatigue.  He has been experiencing a 'winded type feeling' and significant fatigue during walks with his dog over the past six months. The sensation is described as feeling 'really tired' and has worsened over time. He has a history of exercise-induced asthma but no increased use of inhalers or wheezing. The shortness of breath occurs with exertion, particularly towards the end of his walks, and is accompanied by hip and lower back pain. No wheezing, changes in urination or bowel habits, or significant reflux issues. No recent changes in his use of inhalers.  He is experiencing persistent low back pain, for which he is preparing to have surgery. He has received two shots in his back, which provided relief for about two weeks, particularly alleviating shooting pain down his right leg. He has been diagnosed with a bulging disc between L4 and L5 and arthritis. He has tried physical therapy, including dry needling, which provided temporary relief, but the pain persists. The pain affects his ability to sit up straight and causes tightness in his muscles. He experiences hip and lower back pain during exertion.  He has a history of diabetes and reports difficulty managing his blood sugar levels despite dietary changes, including cutting back on carbohydrates and eliminating sugar. His morning blood sugar levels  are in the mid-150s, but they have occasionally spiked to the 300s. He uses Splenda as a sugar substitute and has been pricking his fingers more frequently to monitor his levels, which he finds painful.  He reports experiencing vertigo for the past month, which worsens with head movements such as looking down and then up. He has tried exercises previously recommended by a doctor, but the vertigo persists. His eyes hurt, and he experiences a sensation of imbalance without the room spinning. No sinus pressure but notes itchy ears.  He mentions a history of neck issues, having had a piece of titanium placed to fuse a couple of discs together, which helped alleviate previous neck pain. However, he now reports a neck ache that has persisted for a couple of weeks, which he suspects may be related to his dizziness. He experiences occasional neck pain.      Patient Active Problem List   Diagnosis Date Noted  . Hallux rigidus, right foot 09/15/2021  . Spondylolisthesis at L5-S1 level 05/09/2021  . Low back pain at multiple sites 02/05/2021  . Osteoarthritis   . Heart murmur   . Exercise-induced asthma   . Diabetes mellitus without complication (HCC)   . Arthritis   . Anemia   . Myofascial pain syndrome, cervical 07/28/2019  . Lateral epicondylitis of right elbow 07/28/2019  . Nontraumatic incomplete tear of right rotator cuff 07/25/2019  . Tendinopathy of right biceps tendon 07/25/2019  . Erectile dysfunction 07/11/2019  . Mild persistent asthma/cough variant asthma 04/13/2019  . Allergic rhinitis with  a possible nonallergic component 04/13/2019  . Acid reflux 04/13/2019  . Chronic cough 04/13/2019  . Hoarseness 04/13/2019  . Cervical radicular pain 01/10/2019  . Ulnar neuropathy at elbow of right upper extremity 01/03/2019  . Acute bilateral low back pain without sciatica 01/03/2019  . Onychomycosis 05/25/2018  . Palpitations 07/06/2017  . CAD (coronary artery disease) 07/05/2017  . Family  history of early CAD 07/05/2017  . Abnormal findings on diagnostic imaging of cardiovascular system 06/23/2017  . Low vitamin D level 04/22/2017  . Pain in right ankle and joints of right foot 10/05/2016  . Hemarthrosis of right elbow 02/03/2016  . Small thenar eminence 02/03/2016  . Cervical disc disorder with radiculopathy of cervical region 10/07/2015  . Incomplete rotator cuff tear 09/10/2015  . Nonischemic cardiomyopathy (HCC) 05/15/2015  . Fatigue 05/15/2015  . Dyspnea on exertion 05/15/2015  . Decreased cardiac ejection fraction 04/02/2015  . Strain of latissimus dorsi muscle 01/21/2015  . Shoulder bursitis 10/12/2014  . Spinal stenosis, lumbar region, without neurogenic claudication 10/12/2014  . BPPV (benign paroxysmal positional vertigo) 08/14/2014  . Hyperlipidemia 01/19/2014  . Anemia, unspecified 01/19/2014  . 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 Type 2 diabetes mellitus with hyperglycemia, without long-term current use of insulin (HCC) 12/22/2013  . Hypogonadism in male 12/22/2013  . Arthritis of hand, left 12/22/2013   Past Medical History:  Diagnosis Date  . Abnormal findings on diagnostic imaging of cardiovascular system 06/23/2017  . Acid reflux 04/13/2019  . Acute bilateral low back pain without sciatica 01/03/2019  . Allergic rhinitis with a possible nonallergic component 04/13/2019  . Anemia   . Anemia, unspecified 01/19/2014  . Arthritis   . Arthritis of hand, left 12/22/2013  . 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 BPPV (benign paroxysmal positional vertigo) 08/14/2014  . CAD (coronary artery disease)    2/19 PCI/DESx1 to Lcx  . Cervical disc disorder with radiculopathy of cervical region 10/07/2015  . Cervical radicular pain 01/10/2019  . Chronic cough 04/13/2019  . Decreased cardiac ejection fraction 04/02/2015  . Diabetes mellitus without complication (HCC)   . Dyspnea on exertion  05/15/2015  . Erectile dysfunction 07/11/2019  . Exercise-induced asthma   . Family history of early CAD 07/05/2017  . Fatigue 05/15/2015  . Hallux rigidus, right foot 09/15/2021  . Heart murmur   . Hemarthrosis of right elbow 02/03/2016  . Hoarseness 04/13/2019  . Hyperlipidemia    family hx of high cholesterol  . Hypogonadism in male   . Incomplete rotator cuff tear 09/10/2015   Injected 09/10/2015  . Lateral epicondylitis of right elbow 07/28/2019  . Low back pain at multiple sites 02/05/2021  . Low vitamin D level 04/22/2017  . Mild persistent asthma/cough variant asthma 04/13/2019  . Myofascial pain syndrome, cervical 07/28/2019  . Nonischemic cardiomyopathy (HCC) 05/15/2015  . Nontraumatic incomplete tear of right rotator cuff 07/25/2019  . Onychomycosis 05/25/2018  . Osteoarthritis   . Pain in right ankle and joints of right foot 10/05/2016  . Palpitations 07/06/2017  . Shoulder bursitis 10/12/2014   Injected in 10/12/2014  Repeat 01/21/2015  . Small thenar eminence 02/03/2016  . Spinal stenosis, lumbar region, without neurogenic claudication 10/12/2014  . Spondylolisthesis at L5-S1 level 05/09/2021  . Strain of latissimus dorsi muscle 01/21/2015  . Tendinopathy of right biceps tendon 07/25/2019  . Type 2 diabetes mellitus with hyperglycemia, without long-term current use of insulin (HCC) 12/22/2013  . Ulnar neuropathy at elbow of right upper extremity  01/03/2019   Past Surgical History:  Procedure Laterality Date  . CORONARY PRESSURE/FFR STUDY N/A 06/23/2017   Procedure: INTRAVASCULAR PRESSURE WIRE/FFR STUDY;  Surgeon: Marykay Lex, MD;  Location: [facility] INVASIVE CV LAB;  Service: Cardiovascular;  Laterality: N/A;  . CORONARY STENT INTERVENTION N/A 06/23/2017   Procedure: CORONARY STENT INTERVENTION;  Surgeon: Marykay Lex, MD;  Location: [facility] INVASIVE CV LAB;  Service: Cardiovascular;  Laterality: N/A;  . KNEE ARTHROSCOPY    . LEFT HEART CATH AND CORONARY  ANGIOGRAPHY N/A 06/23/2017   Procedure: LEFT HEART CATH AND CORONARY ANGIOGRAPHY;  Surgeon: Marykay Lex, MD;  Location: [facility] INVASIVE CV LAB;  Service: Cardiovascular;  Laterality: N/A;  . SHOULDER ARTHROSCOPY WITH BICEPSTENOTOMY Right 09/06/2019   Procedure: SHOULDER ARTHROSCOPY WITH BICEPSTENOTOMY;  Surgeon: Tarry Kos, MD;  Location: [facility];  Service: Orthopedics;  Laterality: Right;  . SHOULDER ARTHROSCOPY WITH SUBACROMIAL DECOMPRESSION Right 09/06/2019   Procedure: RIGHT SHOULDER ARTHROSCOPY WITH EXTENSIVE DEBRIDEMENT, SUBACROMIAL DECOMPRESSION, BICEPS TENOTOMY;  Surgeon: Tarry Kos, MD;  Location: [facility];  Service: Orthopedics;  Laterality: Right;  . TENOTOMY ACHILLES TENDON     Social History   Tobacco Use  . Smoking status: Never  . Smokeless tobacco: Never  Vaping Use  . Vaping status: Never Used  Substance Use Topics  . Alcohol use: Yes    Alcohol/week: 2.0 standard drinks of alcohol    Types: 2 Glasses of wine per week    Comment: rare  . Drug use: No   Social History   Socioeconomic History  . Marital status: Married    Spouse name: Not on file  . Number of children: Not on file  . Years of education: Not on file  . Highest education level: Some college, no degree  Occupational History  . Occupation: Conservation officer, historic buildings   Tobacco Use  . Smoking status: Never  . Smokeless tobacco: Never  Vaping Use  . Vaping status: Never Used  Substance and Sexual Activity  . Alcohol use: Yes    Alcohol/week: 2.0 standard drinks of alcohol    Types: 2 Glasses of wine per week    Comment: rare  . Drug use: No  . Sexual activity: Not on file  Other Topics Concern  . Not on file  Social History Narrative   Previously worked as Environmental manager   Currently working at [employer]   Married 30 years   2 children ages 66, 45   Wife is psychiatrist - Dr. Annitta Needs   Moved from [place]   Grew up in [place]   Social Drivers of Health    Financial Resource Strain: Low Risk  (05/07/2023)   Overall Financial Resource Strain (CARDIA)   . Difficulty of Paying Living Expenses: Not hard at all  Food Insecurity: No Food Insecurity (05/07/2023)   Hunger Vital Sign   . Worried About Programme researcher, broadcasting/film/video in the Last Year: Never true   . Ran Out of Food in the Last Year: Never true  Transportation Needs: No Transportation Needs (05/07/2023)   PRAPARE - Transportation   . Lack of Transportation (Medical): No   . Lack of Transportation (Non-Medical): No  Physical Activity: Sufficiently Active (05/07/2023)   Exercise Vital Sign   . Days of Exercise per Week: 7 days   . Minutes of Exercise per Session: 30 min  Stress: No Stress Concern Present (05/07/2023)   [employer] of Occupational Health - Occupational Stress Questionnaire   . Feeling of  Stress : Not at all  Social Connections: Socially Integrated (05/07/2023)   Social Connection and Isolation Panel [NHANES]   . Frequency of Communication with Friends and Family: More than three times a week   . Frequency of Social Gatherings with Friends and Family: Once a week   . Attends Religious Services: 1 to 4 times per year   . Active Member of Clubs or Organizations: Yes   . Attends Banker Meetings: 1 to 4 times per year   . Marital Status: Married  Catering manager Violence: Not on file   Family Status  Relation Name Status  . Mother  Deceased at age 29  . Father  Deceased at age 67  . Brother  Alive  . Brother  (Not Specified)  . Daughter  Alive  . Son  Alive  . MGM  Deceased  . MGF  Deceased  . PGM  Deceased  . PGF  Deceased  . Emelda Brothers  Deceased  . Neg Hx  (Not Specified)  No partnership data on file   Family History  Problem Relation Age of Onset  . Lung cancer Mother 27  . Cancer Mother        lung cancer  . Coronary artery disease Father 18  . Heart disease Father   . Hypothyroidism Brother   . Food Allergy Son   . Asthma Paternal Aunt   . Colon  cancer Neg Hx   . Prostate cancer Neg Hx   . Allergic rhinitis Neg Hx   . Angioedema Neg Hx   . Eczema Neg Hx   . Immunodeficiency Neg Hx   . Urticaria Neg Hx    Allergies  Allergen Reactions  . Invokana [Canagliflozin] Rash  . Grass Pollen(K-O-R-T-Swt Vern)   . Lisinopril Cough  . Semaglutide Nausea Only    Patient is intolerant to Rybelsus      Review of Systems  Constitutional:  Negative for fever and malaise/fatigue.  HENT:  Negative for congestion.   Eyes:  Negative for blurred vision.  Respiratory:  Negative for cough and shortness of breath.   Cardiovascular:  Negative for chest pain, palpitations and leg swelling.  Gastrointestinal:  Negative for abdominal pain, blood in stool, nausea and vomiting.  Genitourinary:  Negative for dysuria and frequency.  Musculoskeletal:  Negative for back pain and falls.  Skin:  Negative for rash.  Neurological:  Negative for dizziness, loss of consciousness and headaches.  Endo/Heme/Allergies:  Negative for environmental allergies.  Psychiatric/Behavioral:  Negative for depression. The patient is not nervous/anxious.       Objective:     BP 110/60 (BP Location: Left Arm, Patient Position: Sitting, Cuff Size: Normal)   Pulse 76   Temp (!) 97.5 F (36.4 C) (Oral)   Resp 18   Ht 5\' 10"  (1.778 m)   Wt 209 lb 9.6 oz (95.1 kg)   SpO2 97%   BMI 30.07 kg/m  BP Readings from Last 3 Encounters:  07/05/23 110/60  05/11/23 136/78  05/03/23 116/76   Wt Readings from Last 3 Encounters:  07/05/23 209 lb 9.6 oz (95.1 kg)  05/11/23 212 lb 6.4 oz (96.3 kg)  05/03/23 212 lb (96.2 kg)   SpO2 Readings from Last 3 Encounters:  07/05/23 97%  05/11/23 98%  05/03/23 97%      Physical Exam Vitals and nursing note reviewed.  Constitutional:      General: He is not in acute distress.    Appearance: Normal appearance. He is  well-developed.  HENT:     Head: Normocephalic and atraumatic.     Right Ear: Tympanic membrane, ear canal and  external ear normal. There is no impacted cerumen.     Left Ear: Tympanic membrane, ear canal and external ear normal. There is no impacted cerumen.     Nose: Nose normal.     Mouth/Throat:     Mouth: Mucous membranes are moist.     Pharynx: Oropharynx is clear. No oropharyngeal exudate or posterior oropharyngeal erythema.  Eyes:     General: No scleral icterus.       Right eye: No discharge.        Left eye: No discharge.     Conjunctiva/sclera: Conjunctivae normal.     Pupils: Pupils are equal, round, and reactive to light.  Neck:     Thyroid: No thyromegaly.     Vascular: No JVD.  Cardiovascular:     Rate and Rhythm: Normal rate and regular rhythm.     Heart sounds: Normal heart sounds. No murmur heard. Pulmonary:     Effort: Pulmonary effort is normal. No respiratory distress.     Breath sounds: Normal breath sounds.  Abdominal:     General: Bowel sounds are normal. There is no distension.     Palpations: Abdomen is soft. There is no mass.     Tenderness: There is no abdominal tenderness. There is no guarding or rebound.  Musculoskeletal:        General: Normal range of motion.     Cervical back: Normal range of motion and neck supple.     Right lower leg: No edema.     Left lower leg: No edema.  Lymphadenopathy:     Cervical: No cervical adenopathy.  Skin:    General: Skin is warm and dry.     Findings: No erythema or rash.  Neurological:     Mental Status: He is alert and oriented to person, place, and time.     Cranial Nerves: No cranial nerve deficit.     Motor: No abnormal muscle tone.     Deep Tendon Reflexes: Reflexes are normal and symmetric. Reflexes normal.  Psychiatric:        Mood and Affect: Mood normal.        Behavior: Behavior normal.        Thought Content: Thought content normal.        Judgment: Judgment normal.     No results found for any visits on 07/05/23.  Last CBC Lab Results  Component Value Date   WBC 5.6 07/21/2022   HGB 12.6 (L)  07/21/2022   HCT 37.4 (L) 07/21/2022   MCV 99.3 07/21/2022   MCH 33.1 03/06/2022   RDW 14.9 07/21/2022   PLT 247.0 07/21/2022   Last metabolic panel Lab Results  Component Value Date   GLUCOSE 160 (H) 07/05/2023   NA 139 07/05/2023   K 4.0 07/05/2023   CL 104 07/05/2023   CO2 28 07/05/2023   BUN 17 07/05/2023   CREATININE 0.82 07/05/2023   GFR 90.17 07/21/2022   CALCIUM 9.2 07/05/2023   PROT 6.7 04/13/2023   ALBUMIN 4.3 04/13/2023   LABGLOB 3.0 03/06/2022   AGRATIO 1.3 03/06/2022   BILITOT 0.4 04/13/2023   ALKPHOS 71 04/13/2023   AST 25 04/13/2023   ALT 32 04/13/2023   ANIONGAP 9 03/06/2022   Last lipids Lab Results  Component Value Date   CHOL 114 04/13/2023   HDL 45.00 04/13/2023   LDLCALC  55 04/13/2023   TRIG 67.0 04/13/2023   CHOLHDL 3 04/13/2023   Last hemoglobin A1c Lab Results  Component Value Date   HGBA1C 8.0 (H) 01/26/2023   Last thyroid functions Lab Results  Component Value Date   TSH 2.55 02/16/2022   Last vitamin D Lab Results  Component Value Date   VD25OH 44.59 02/16/2022   Last vitamin B12 and Folate Lab Results  Component Value Date   VITAMINB12 196 03/06/2022   FOLATE >24.0 01/27/2022      The ASCVD Risk score (Arnett DK, et al., 2019) failed to calculate for the following reasons:   The valid total cholesterol range is 130 to 320 mg/dL    Assessment & Plan:   Problem List Items Addressed This Visit   None Visit Diagnoses       SOB (shortness of breath)    -  Primary   Relevant Orders   EKG 12-Lead (Completed)   DG Chest 2 View   CBC with Differential/Platelet   Comprehensive metabolic panel   TSH   Vitamin B12   VITAMIN D 25 Hydroxy (Vit-D Deficiency, Fractures)   COVID-19, Flu A+B and RSV     Vertigo       Relevant Orders   CBC with Differential/Platelet   Comprehensive metabolic panel   TSH   Vitamin B12   VITAMIN D 25 Hydroxy (Vit-D Deficiency, Fractures)   COVID-19, Flu A+B and RSV     History of fusion  of cervical spine       Relevant Orders   DG Cervical Spine Complete     Assessment and Plan    Exertional Dyspnea Worsening exertional dyspnea over six months, especially during walks, with no wheezing or increased inhaler use. EKG is normal. Differential includes cardiac, pulmonary, or deconditioning causes. Further cardiac evaluation is necessary due to history of stents and artery issues. Order chest x-ray, check blood work for vitamin levels and hemoglobin, and refer to cardiologist Dr. Normajean Baxter for potential stress test.  Low Back Pain with Radiculopathy Chronic low back pain with radiculopathy likely due to L4-L5 bulging disc and arthritis. Recent epidural steroid injections provided temporary relief, but pain significantly impacts quality of life. Surgery with Dr. Danielle Dess is under consideration, with discussions on potential outcomes, including relief of leg pain but possible persistence of back pain. Follow-up with Dr. Christain Sacramento on March 12 to discuss surgical options. Order neck x-ray to evaluate pain and potential relation to dizziness.  Type 2 Diabetes Mellitus Difficulty managing blood glucose levels despite dietary modifications, with levels ranging from mid-150s to 300s. Discussed the impact of artificial sweeteners and stress/pain on glucose levels. Mentioned over-the-counter continuous glucose monitor (Dexcom alternative) for better management. Follow-up with endocrinologist on Thursday and consider the continuous glucose monitor.  Benign Paroxysmal Positional Vertigo (BPPV) Vertigo for the past month, exacerbated by head movements. Previous exercises provided temporary relief, but no recent formal physical therapy. Consider formal physical therapy for vertigo management.  General Health Maintenance Measles booster not recommended. Discussed potential allergies contributing to fatigue and dizziness. Continue vaccinations as per guidelines and consider Claritin for potential  allergies.  Follow-up Follow-up with Dr. Carmelia Roller in 2-3 weeks to review lab results, with Dr. Christain Sacramento on March 12 for back pain and potential surgery, and with endocrinologist on Thursday for diabetes management.        Return in about 4 weeks (around 08/02/2023), or if symptoms worsen or fail to improve, for Dr Carmelia Roller.    Donato Schultz,  DO

## 2023-07-05 NOTE — Telephone Encounter (Signed)
 Chief Complaint BREATHING - shortness of breath or sounds breathless Reason for Call Symptomatic / Request for Health Information Initial Comment Caller has SOB, dizziness. Office transferred him to triage RN. Translation No Nurse Assessment Nurse: Gasper Sells, RN, Marylu Lund Date/Time Lamount Cohen Time): 07/05/2023 9:29:32 AM Confirm and document reason for call. If symptomatic, describe symptoms. ---Caller has SOB, dizziness/ Unusually fatigued after his daily walk. Usually fine. SOB started 2 weeks ago. Does the patient have any new or worsening symptoms? ---Yes Will a triage be completed? ---Yes Related visit to physician within the last 2 weeks? ---No Does the PT have any chronic conditions? (i.e. diabetes, asthma, this includes High risk factors for pregnancy, etc.) ---Yes List chronic conditions. ---chronic pain with back, Diabetic 2, 176 fasting a.m. this a.m. referred to Diabetic specialist, blood thinners d/t heart stent 2015, saw cardiac Dr. last few mos. Is this a behavioral health or substance abuse call? ---No Guidelines Guideline Title Affirmed Question Affirmed Notes Nurse Date/Time (Eastern Time) Diabetes - High Blood Sugar Patient sounds very sick or weak to the triager Gasper Sells, RN, Marylu Lund 07/05/2023 9:34:56 AM Disp. Time Lamount Cohen Time) Disposition Final User 07/05/2023 9:39:37 AM Go to ED Now (or PCP triage) Yes Gasper Sells, RN, Lavonia Dana NOTE: All timestamps contained within this report are represented as [place] Standard Time. CONFIDENTIALTY NOTICE: This fax transmission is intended only for the addressee. It contains information that is legally privileged, confidential or otherwise protected from use or disclosure. If you are not the intended recipient, you are strictly prohibited from reviewing, disclosing, copying using or disseminating any of this information or taking any action in reliance on or regarding this information. If you have received this fax in error,  please notify us immediately by telephone so that we can arrange for its return to [place]. Phone: [phone], Toll-Free: [phone], Fax: [phone] DAVID_[email or web address] 1955/07/13 Page: 2 of 2 CallId: [id number] Final Disposition 07/05/2023 9:39:37 AM Go to ED Now (or PCP triage) Yes Gasper Sells, RN, Herbert Pun Disagree/Comply Comply Caller Understands Yes PreDisposition Call Doctor Care Advice Given Per Guideline GO TO ED/UCC NOW (OR PCP TRIAGE): * IF NO PCP (PRIMARY CARE PROVIDER) SECOND-LEVEL TRIAGE: You need to be seen within the next hour. Go to the ED/UCC at _____________ Hospital. Leave as soon as you can. ANOTHER ADULT SHOULD DRIVE: * It is better and safer if another adult drives instead of you. CARE ADVICE given per Diabetes - High Blood Sugar (Adult) guideline. Comments User: Jason Coop, RN Date/Time Lamount Cohen Time): 07/05/2023 9:47:04 AM Scheduled 3pm appt in office. Advised UC or ER if anything should worsen w/s/s. Pt d/n want urgent immediate appt as he has things scheduled this a.m. Advised to call Cardiac Dr to report s/s and sched. appt. Uv Referrals Warm transfer to backline

## 2023-07-05 NOTE — Patient Instructions (Signed)
 Dizziness Dizziness is a common problem. It makes you feel unsteady or light-headed. You may feel like you're about to faint. Dizziness can lead to getting hurt if you stumble or fall. It's more common to feel dizzy if you're an older adult. Many things can cause you to feel dizzy. These include: Medicines. Dehydration. This is when there's not enough water in your body. Illness. Follow these instructions at home: Eating and drinking  Drink enough fluid to keep your pee (urine) pale yellow. This helps keep you from getting dehydrated. Try to drink more clear fluids, such as water. Do not drink alcohol. Try to limit how much caffeine you take in. Try to limit how much salt, also called sodium, you take in. Activity Try not to make quick movements. Stand up slowly from sitting in a chair. Steady yourself until you feel okay. In the morning, first sit up on the side of the bed. When you feel okay, hold onto something and slowly stand up. Do this until you know that your balance is okay. If you need to stand in one place for a long time, move your legs often. Tighten and relax the muscles in your legs while you're standing. Do not drive or use machines if you feel dizzy. Avoid bending down if you feel dizzy. Place items in your home so you can reach them without leaning over. Lifestyle Do not smoke, vape, or use products with nicotine or tobacco in them. If you need help quitting, talk with your health care provider. Try to lower your stress level. You can do this by using methods like yoga or meditation. Talk with your provider if you need help. General instructions Watch your dizziness for any changes. Take your medicines only as told by your provider. Talk with your provider if you think you're dizzy because of a medicine you're taking. Tell a friend or a family member that you're feeling dizzy. If they spot any changes in your behavior, have them call your provider. Contact a health care  provider if: Your dizziness doesn't go away, or you have new symptoms. Your dizziness gets worse. You feel like you may vomit. You have trouble hearing. You have a fever. You have neck pain or a stiff neck. You fall or get hurt. Get help right away if: You vomit each time you eat or drink. You have watery poop and can't eat or drink. You have trouble talking, walking, swallowing, or using your arms, hands, or legs. You feel very weak. You're bleeding. You're not thinking clearly, or you have trouble forming sentences. A friend or family member may spot this. Your vision changes, or you get a very bad headache. These symptoms may be an emergency. Call 911 right away. Do not wait to see if the symptoms will go away. Do not drive yourself to the hospital. This information is not intended to replace advice given to you by your health care provider. Make sure you discuss any questions you have with your health care provider. Document Revised: 01/21/2023 Document Reviewed: 06/04/2022 [employer] Patient Education  2024 [employer].

## 2023-07-06 ENCOUNTER — Telehealth: Payer: Self-pay | Admitting: Cardiology

## 2023-07-06 LAB — CBC WITH DIFFERENTIAL/PLATELET
Basophils Absolute: 0.1 10*3/uL (ref 0.0–0.1)
Basophils Relative: 1.4 % (ref 0.0–3.0)
Eosinophils Absolute: 0.1 10*3/uL (ref 0.0–0.7)
Eosinophils Relative: 1.3 % (ref 0.0–5.0)
HCT: 36.5 % — ABNORMAL LOW (ref 39.0–52.0)
Hemoglobin: 12.2 g/dL — ABNORMAL LOW (ref 13.0–17.0)
Lymphocytes Relative: 31.6 % (ref 12.0–46.0)
Lymphs Abs: 1.5 10*3/uL (ref 0.7–4.0)
MCHC: 33.4 g/dL (ref 30.0–36.0)
MCV: 100.1 fl — ABNORMAL HIGH (ref 78.0–100.0)
Monocytes Absolute: 0.4 10*3/uL (ref 0.1–1.0)
Monocytes Relative: 9.5 % (ref 3.0–12.0)
Neutro Abs: 2.6 10*3/uL (ref 1.4–7.7)
Neutrophils Relative %: 56.2 % (ref 43.0–77.0)
Platelets: 198 10*3/uL (ref 150.0–400.0)
RBC: 3.65 Mil/uL — ABNORMAL LOW (ref 4.22–5.81)
RDW: 14.6 % (ref 11.5–15.5)
WBC: 4.6 10*3/uL (ref 4.0–10.5)

## 2023-07-06 LAB — COMPREHENSIVE METABOLIC PANEL
ALT: 25 U/L (ref 0–53)
AST: 16 U/L (ref 0–37)
Albumin: 4.1 g/dL (ref 3.5–5.2)
Alkaline Phosphatase: 56 U/L (ref 39–117)
BUN: 20 mg/dL (ref 6–23)
CO2: 27 meq/L (ref 19–32)
Calcium: 9.3 mg/dL (ref 8.4–10.5)
Chloride: 103 meq/L (ref 96–112)
Creatinine, Ser: 0.86 mg/dL (ref 0.40–1.50)
GFR: 87.96 mL/min (ref >60.00)
Glucose, Bld: 290 mg/dL — ABNORMAL HIGH (ref 70–99)
Potassium: 4.2 meq/L (ref 3.5–5.1)
Sodium: 137 meq/L (ref 135–145)
Total Bilirubin: 0.4 mg/dL (ref 0.2–1.2)
Total Protein: 7 g/dL (ref 6.0–8.3)

## 2023-07-06 LAB — VITAMIN D 25 HYDROXY (VIT D DEFICIENCY, FRACTURES): VITD: 28.95 ng/mL — ABNORMAL LOW (ref 30.00–100.00)

## 2023-07-06 LAB — VITAMIN B12: Vitamin B-12: 110 pg/mL — ABNORMAL LOW (ref 211–911)

## 2023-07-06 LAB — TSH: TSH: 2.63 u[IU]/mL (ref 0.35–5.50)

## 2023-07-06 NOTE — Telephone Encounter (Signed)
 Pt c/o Shortness Of Breath: STAT if SOB developed within the last 24 hours or pt is noticeably SOB on the phone  1. Are you currently SOB (can you hear that pt is SOB on the phone)? No   2. How long have you been experiencing SOB? Two weeks   3. Are you SOB when sitting or when up moving around? Both  4. Are you currently experiencing any other symptoms?  Vertigo. Pt also states when he walks his hips hurt.

## 2023-07-06 NOTE — Telephone Encounter (Signed)
 Pt saw PCP yesterday for increased shortness of breath when walking dog and hip pain. They recommended an appt with cardiology for evaluation. Sent to front desk for appt.

## 2023-07-07 ENCOUNTER — Encounter: Payer: Self-pay | Admitting: Family Medicine

## 2023-07-07 ENCOUNTER — Encounter: Payer: Self-pay | Admitting: Cardiology

## 2023-07-07 LAB — COVID-19, FLU A+B AND RSV
Influenza A, NAA: NOT DETECTED
Influenza B, NAA: NOT DETECTED
RSV, NAA: NOT DETECTED
SARS-CoV-2, NAA: NOT DETECTED

## 2023-07-07 NOTE — Progress Notes (Deleted)
 Cardiology Office Note:    Date:  07/07/2023   ID:  Reginald Rios, DOB 1956/02/17, MRN [id number]  PCP:  Reginald Dory, DO  Cardiologist:  Reginald Herrlich, MD    Referring MD: Reginald Rios*    ASSESSMENT:    No diagnosis found. PLAN:    In order of problems listed above:  ***   Next appointment: ***   Medication Adjustments/Labs and Tests Ordered: Current medicines are reviewed at length with the patient today.  Concerns regarding medicines are outlined above.  No orders of the defined types were placed in this encounter.  No orders of the defined types were placed in this encounter.    History of Present Illness:    Reginald Rios is a 68 y.o. male with a hx of CAD with PCI and stent left circumflex coronary artery February 2019 type 2 diabetes hyperlipidemia and sinus bradycardia last seen 08/19/2021.  His PCP 07/05/2023 with worsening exertional shortness of breath during physical activity.  CBC showed a hemoglobin of 12.2 CMP normal except for glucose of 290 chest x-ray is performed and there is no results today's.  He had a stress echo performed there is 607 2023 no findings of EKG or echo ischemia low risk study very good exercise capacity 10 METS and had a normal heart rate and blood pressure response achieving 102% of age-predicted maximum heart rate. 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   BPPV (benign paroxysmal positional vertigo) 08/14/2014   CAD (coronary artery disease)    2/19 PCI/DESx1 to  Lcx   Cervical disc disorder with radiculopathy of cervical region 10/07/2015   Cervical radicular pain 01/10/2019   Chronic cough 04/13/2019   Decreased cardiac ejection fraction 04/02/2015   Diabetes mellitus without complication (HCC)    Dyspnea on exertion 05/15/2015   Erectile dysfunction 07/11/2019   Exercise-induced asthma    Family history of early CAD 07/05/2017   Fatigue 05/15/2015   Hallux rigidus, right foot 09/15/2021   Heart murmur    Hemarthrosis of right elbow 02/03/2016   Hoarseness 04/13/2019   Hyperlipidemia    family hx of high cholesterol   Hypogonadism in male    Incomplete rotator cuff tear 09/10/2015   Injected 09/10/2015   Lateral epicondylitis of right elbow 07/28/2019   Low back pain at multiple sites 02/05/2021   Low vitamin D level 04/22/2017   Mild persistent asthma/cough variant asthma 04/13/2019   Myofascial pain syndrome, cervical 07/28/2019   Neck pain 06/01/2018   Nonischemic cardiomyopathy (HCC) 05/15/2015   Nontraumatic incomplete tear of right rotator cuff 07/25/2019   Occipital headache 06/01/2018   Onychomycosis 05/25/2018   Osteoarthritis    Pain in right ankle and joints of right foot 10/05/2016   Palpitations 07/06/2017   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   Shoulder bursitis 10/12/2014   Injected in 10/12/2014  Repeat 01/21/2015   Small thenar eminence 02/03/2016   Spinal stenosis, lumbar region, without neurogenic claudication 10/12/2014   Spondylolisthesis at L5-S1 level 05/09/2021   Strain of latissimus dorsi muscle 01/21/2015   Tendinopathy of right biceps  tendon 07/25/2019   Type 2 diabetes mellitus with hyperglycemia, without long-term current use of insulin (HCC) 12/22/2013   Ulnar neuropathy at elbow of right upper extremity 01/03/2019    Current Medications: No outpatient medications have been marked as taking for the 07/08/23 encounter (Appointment) with Reginald Daub, MD.      EKGs/Labs/Other Studies Reviewed:    The  following studies were reviewed today:  Cardiac Studies & Procedures   ______________________________________________________________________________________________ CARDIAC CATHETERIZATION  CARDIAC CATHETERIZATION 06/23/2017  Narrative Images from the original result were not included.   CULPRIT LESION mid Cx to Dist Cx lesion is 80% stenosed.  A drug-eluting stent was successfully placed using a STENT SYNERGY DES 2.5X16. Postdilated and taper fashion from 3.1-2.6 mm  Post intervention, there is a 0% residual stenosis.  Prox LAD to Mid LAD lesion is 60% stenosed. --FFR 0.83. Not significant.  The ostial-proximal LAD and Circumflex have mild-moderate calcification without significant stenosis.  OM1 is a relatively small caliber vessel after gives off a branch and is not a good PCI target. No obvious lesions were noted to explain the FFR positive findings.  Successful PCI to distal circumflex likely culprit lesion reducing 80% to 0% with a Synergy DES stent postdilated in tapered fashion. FFR not significant lesion of the LAD medical management -this correlates with CTA   Plan: Same day discharge after bed rest and TR band removal DAPT for minimum of 6 months, then okay to stop aspirin but continue Plavix for at least one year. Continue aggressive risk factor modification with lipid management.  Hold Janumet x 48 hr post Cath    Reginald Lemma, M.D., M.S. Interventional Cardiologist  Pager # [phone] Phone # [phone] [address] [place], [place] [address]  Findings Coronary Findings Diagnostic  Dominance: Left  Left Main Vessel is large. The vessel exhibits minimal luminal irregularities.  Left Anterior Descending Ost LAD to Prox LAD lesion is 10% stenosed. The lesion is moderately calcified. Prox LAD to Mid LAD lesion is 60% stenosed. The lesion is moderately calcified. Pressure wire/FFR was performed on the lesion. FFR: 0.83. XB LAD 3.5 Guide -  Prowater Wire -> Micronavvus Catheter, initial FFR 0.92 --> IV Adenosine 192mcg/kg/min x 2 min --> Final FFR 0.83 (NOT PHYSIOLOGICALLY SIGNIFICANT- confirms CTA FFR).  First Septal Branch Vessel is small in size.  Left Circumflex Vessel is large. Ost Cx to Prox Cx lesion is 5% stenosed. The lesion is moderately calcified. Mid Cx to Dist Cx lesion is 80% stenosed. The lesion is tubular, eccentric and irregular.  First Obtuse Marginal Branch Vessel is moderate in size. Ost 1st Mrg lesion is 45% stenosed.  Lateral First Obtuse Marginal Branch Vessel is small in size.  Second Obtuse Marginal Branch Vessel is moderate in size. Ost 2nd Mrg lesion is 40% stenosed.  Left Posterior Descending Artery Vessel is moderate in size.  First Left Posterolateral Branch Vessel is small in size.  Right Coronary Artery Vessel is small. There is mild diffuse disease throughout the vessel. The vessel is mildly calcified.  Acute Marginal Branch Vessel is small in size.  Intervention  Mid Cx to Dist Cx lesion Stent Lesion length:  14 mm. Lesion crossed with guidewire using a WIRE ASAHI PROWATER 180CM. Pre-stent angioplasty was performed using a BALLOON SAPPHIRE 2.25X10. Maximum pressure:  12 atm. Inflation time:  20 sec. A drug-eluting stent was successfully placed using a STENT SYNERGY DES 2.5X16. Maximum pressure: 14 atm. Inflation time: 30 sec. Minimum lumen area:  3.1  mm. Stent strut is well apposed. Tapered 3.1-2.6 mm Post-stent angioplasty was performed using a BALLOON SAPPHIRE [place] 3.0X12. Maximum pressure:  14 atm. Inflation time:  30 sec. Post-Intervention Lesion Assessment The intervention was successful. Pre-interventional TIMI flow is 3. Post-intervention TIMI flow is 3. Treated lesion length:  16 mm. No complications occurred at this lesion. There is a 0% residual stenosis post intervention.   STRESS TESTS  ECHOCARDIOGRAM STRESS TEST 10/08/2021  Narrative EXERCISE STRESS ECHO  REPORT   --------------------------------------------------------------------------------  Patient Name:   Aksh Skellenger Date of Exam: 10/08/2021 Medical Rec #:  [id number]       Height:       70.0 in Accession #:    [id number]      Weight:       197.4 lb Date of Birth:  [birth year]       BSA:          2.076 m Patient Age:    66 years        BP:           154/78 mmHg Patient Gender: M               HR:           70 bpm. Exam Location:  [address]  Procedure: 2D Echo, Stress Echo and Intracardiac Opacification Agent  Indications:    I25.10 Coronary artery disease  History:        Patient has prior history of Echocardiogram examinations, most recent 05/05/2017.  Sonographer:    Daphine Deutscher RDCS Referring Phys: [id number] Loreley Schwall J Woodie Degraffenreid  IMPRESSIONS   1. Excellent exercise capacity (8:00 min:s; 10 METS). Normal HR/BP response to exercise. 2. This is a negative stress echocardiogram for ischemia. 3. This is a low risk study.  FINDINGS  Exam Protocol: The patient exercised on a treadmill according to a Bruce protocol. Definity contrast agent was given IV to delineate the left ventricular endocardial borders.   Patient Performance: The patient exercised for 8 minutes and 0 seconds, achieving 10 METS. The maximum stage achieved was III of the Bruce protocol. The baseline heart rate was 80 bpm. The heart rate at peak stress was 155 bpm. The target heart rate was calculated to be 129 bpm. The percentage of maximum predicted heart rate achieved was 102.2 %. The baseline blood pressure was 154/78 mmHg. The blood pressure at peak stress was 194/69 mmHg. The blood pressure response was normal. The patient developed fatigue during the stress exam. The patient's functional capacity was excellent.  EKG: Resting EKG showed normal sinus rhythm with no abnormal findings. The patient developed no abnormal EKG findings during exercise.   2D Echo Findings: The baseline ejection fraction was  60%. The peak ejection fraction at stress was 80%. Baseline regional wall motion abnormalities were not present. There were no stress-induced wall motion abnormalities. This is a negative stress echocardiogram for ischemia.   Lennie Odor MD Electronically signed on 10/08/2021 at 3:40:21 PM     Final   ECHOCARDIOGRAM  ECHOCARDIOGRAM COMPLETE 05/05/2017  Narrative *[facility]* [address] [place], [place] [address] [phone]  ------------------------------------------------------------------- Transthoracic Echocardiography  Patient:    Nabil, Bubolz MR #:       [id number] Study Date: 05/05/2017 Gender:     M Age:        50 Height:     177.8 cm Weight:     87.2 kg BSA:        2.09 m^2 Pt.  Status: Room:  ATTENDING   Tobias Alexander, M.D. ORDERING    Tobias Alexander, M.D. REFERRING   Tobias Alexander, M.D. PERFORMING  [facility], Outpatient  cc:  ------------------------------------------------------------------- LV EF: 50% -   55%  ------------------------------------------------------------------- Indications:      R06.09 Dyspnea on exertion. R53.83 Fatigue.  ------------------------------------------------------------------- History:   PMH:  Non-ischemic cardiomyopathy. Asthma. Low back pain.  Murmur.  Risk factors:  Family history of coronary artery disease. Hypertension. Diabetes mellitus. Dyslipidemia.  ------------------------------------------------------------------- Study Conclusions  - Procedure narrative: Transthoracic echocardiography. Image quality was adequate. The study was technically difficult. - Left ventricle: The cavity size was normal. Systolic function was normal. The estimated ejection fraction was in the range of 50% to 55%. Wall motion was normal; there were no regional wall motion abnormalities. Left ventricular diastolic function parameters were normal. - Aortic valve: Transvalvular velocity was within the normal  range. There was no stenosis. There was no regurgitation. - Mitral valve: Transvalvular velocity was within the normal range. There was no evidence for stenosis. There was trivial regurgitation. - Right ventricle: The cavity size was normal. Wall thickness was normal. Systolic function was normal. - Tricuspid valve: There was trivial regurgitation. - Pulmonary arteries: Systolic pressure was within the normal range. PA peak pressure: 27 mm Hg (S).  ------------------------------------------------------------------- Study data:  Comparison was made to the study of 07/03/2015.  Study status:  Routine.  Procedure:  Transthoracic echocardiography. Image quality was adequate. The study was technically difficult. Study completion:  There were no complications. Transthoracic echocardiography.  M-mode, complete 2D, spectral Doppler, and color Doppler.  Birthdate:  Patient birthdate: [birth year].  Age:  Patient is 68 yr old.  Sex:  Gender: male. BMI: 27.6 kg/m^2.  Blood pressure:     115/69  Patient status: Outpatient.  Study date:  Study date: 05/05/2017. Study time: 09:01 AM.  Location:  [facility]  -------------------------------------------------------------------  ------------------------------------------------------------------- Left ventricle:  The cavity size was normal. Systolic function was normal. The estimated ejection fraction was in the range of 50% to 55%. Wall motion was normal; there were no regional wall motion abnormalities. The transmitral flow pattern was normal. The deceleration time of the early transmitral flow velocity was normal. The pulmonary vein flow pattern was normal. The tissue Doppler parameters were normal. Left ventricular diastolic function parameters were normal.  ------------------------------------------------------------------- Aortic valve:   Trileaflet; normal thickness leaflets. Mobility was not restricted.  Doppler:  Transvalvular  velocity was within the normal range. There was no stenosis. There was no regurgitation.  ------------------------------------------------------------------- Aorta:  Aortic root: The aortic root was normal in size.  ------------------------------------------------------------------- Mitral valve:   Structurally normal valve.   Mobility was not restricted.  Doppler:  Transvalvular velocity was within the normal range. There was no evidence for stenosis. There was trivial regurgitation.    Peak gradient (D): 4 mm Hg.  ------------------------------------------------------------------- Left atrium:  The atrium was normal in size.  ------------------------------------------------------------------- Right ventricle:  The cavity size was normal. Wall thickness was normal. Systolic function was normal.  ------------------------------------------------------------------- Pulmonic valve:    Structurally normal valve.   Cusp separation was normal.  Doppler:  Transvalvular velocity was within the normal range. There was no evidence for stenosis. There was no regurgitation.  ------------------------------------------------------------------- Tricuspid valve:   Structurally normal valve.    Doppler: Transvalvular velocity was within the normal range. There was trivial regurgitation.  ------------------------------------------------------------------- Pulmonary artery:   The main pulmonary artery was normal-sized. Systolic pressure was within the normal range.  ------------------------------------------------------------------- Right atrium:  The atrium was normal in size.  ------------------------------------------------------------------- Pericardium:  There was no pericardial effusion.  ------------------------------------------------------------------- Systemic veins: Inferior vena cava: The vessel was normal in size. The respirophasic diameter changes were in the normal range (>=  50%), consistent with normal central venous pressure.  ------------------------------------------------------------------- Measurements  Left ventricle                         Value        Reference LV ID, ED, PLAX chordal                43.6  mm     43 - 52 LV ID, ES, PLAX chordal                34    mm     23 - 38 LV fx shortening, PLAX chordal (L)     22    %      >=29 LV PW thickness, ED                    9.11  mm     ---------- IVS/LV PW ratio, ED                    1.15         <=1.3 LV e&', lateral                         10.5  cm/s   ---------- LV E/e&', lateral                       9.06         ---------- LV e&', medial                          7.87  cm/s   ---------- LV E/e&', medial                        12.08        ---------- LV e&', average                         9.19  cm/s   ---------- LV E/e&', average                       10.35        ----------  Ventricular septum                     Value        Reference IVS thickness, ED                      10.5  mm     ----------  LVOT                                   Value        Reference LVOT ID, S                             21    mm     ---------- LVOT area  3.46  cm^2   ----------  Aorta                                  Value        Reference Aortic root ID, ED                     29    mm     ----------  Left atrium                            Value        Reference LA ID, A-P, ES                         35    mm     ---------- LA ID/bsa, A-P                         1.67  cm/m^2 <=2.2 LA volume, S                           56.6  ml     ---------- LA volume/bsa, S                       27.1  ml/m^2 ---------- LA volume, ES, 1-p A4C                 58.1  ml     ---------- LA volume/bsa, ES, 1-p A4C             27.8  ml/m^2 ---------- LA volume, ES, 1-p A2C                 54.3  ml     ---------- LA volume/bsa, ES, 1-p A2C             26    ml/m^2 ----------  Mitral valve                            Value        Reference Mitral E-wave peak velocity            95.1  cm/s   ---------- Mitral A-wave peak velocity            86.8  cm/s   ---------- Mitral deceleration time       (H)     254   ms     150 - 230 Mitral peak gradient, D                4     mm Hg  ---------- Mitral E/A ratio, peak                 1            ----------  Pulmonary arteries                     Value        Reference PA pressure, S, DP                     27    mm Hg  <=30  Tricuspid valve  Value        Reference Tricuspid regurg peak velocity         246   cm/s   ---------- Tricuspid peak RV-RA gradient          24    mm Hg  ----------  Right atrium                           Value        Reference RA ID, S-I, ES, A4C                    47.5  mm     34 - 49 RA area, ES, A4C                       11.9  cm^2   8.3 - 19.5 RA volume, ES, A/L                     24.8  ml     ---------- RA volume/bsa, ES, A/L                 11.9  ml/m^2 ----------  Systemic veins                         Value        Reference Estimated CVP                          3     mm Hg  ----------  Right ventricle                        Value        Reference RV pressure, S, DP                     27    mm Hg  <=30 RV s&', lateral, S                      8.38  cm/s   ----------  Legend: (L)  and  (H)  mark values outside specified reference range.  ------------------------------------------------------------------- Prepared and Electronically Authenticated by  Chilton Si, MD 2019-01-02T10:27:02      CT SCANS  CT CORONARY FRACTIONAL FLOW RESERVE DATA PREP 06/08/2017  Narrative EXAM: FF/RCT ANALYSIS  FINDINGS: FFRct analysis was performed on the original cardiac CT angiogram dataset. Diagrammatic representation of the FFRct analysis is provided in a separate PDF document in PACS. This dictation was created using the PDF document and an interactive 3D model of the results. 3D model  is not available in the EMR/PACS. Normal FFR range is >0.80.  1. Left Main:  No significant stenosis.  2. LAD: No significant stenosis. 3. D1: No significant stenosis. 4. LCX: Proximal: 0.89, mid LCX: 0.85, distal LCX: 0.80. 5. OM1: Proximal: 0.83, distal: 0.77. 6. RCA: Small non-dominant.  IMPRESSION: 1. CT FFR analysis showed significant stenoses in the mid LCX and proximal OM1 arteries. A cardiac catheterization is recommended.   Electronically Signed By: Tobias Alexander On: 06/08/2017 17:58   CT CORONARY MORPH W/CTA COR W/SCORE 06/08/2017  Addendum 06/08/2017  5:57 PM ADDENDUM REPORT: 06/08/2017 17:54  CLINICAL DATA:  69 -year-old male with fatigue and exertional dyspnea.  EXAM: Cardiac/Coronary  CT  TECHNIQUE: The patient was scanned  on a [employer].  FINDINGS: A 120 kV prospective scan was triggered in the descending thoracic aorta at 111 HU's. Axial non-contrast 3 mm slices were carried out through the heart. The data set was analyzed on a dedicated work station and scored using the Agatson method. Gantry rotation speed was 250 msecs and collimation was .6 mm. No beta blockade and 0.8 mg of sl NTG was given. The 3D data set was reconstructed in 5% intervals of the 67-82 % of the R-R cycle. Diastolic phases were analyzed on a dedicated work station using MPR, MIP and VRT modes. The patient received 80 cc of contrast.  Aorta:  Normal size.  No calcifications.  No dissection.  Aortic Valve:  Trileaflet.  No calcifications.  Coronary Arteries:  Normal coronary origin.  Left dominance.  Left main is a large artery that gives rise to LAD and LCX arteries. LM has no plaque.  LAD is a large vessel that gives rise to two diagonal arteries. Proximal LADS has moderate diffuse almost circumferential plaque with stenosis 25-50% and a focal stenosis of 50-69% at the takeoff of second diagonal artery. Mid LAD has a moderate diffuse predominantly calcified  plaque that is almost circumferential with stenosis 50-69%.  D1 is very small with no obvious plaque.  D2 has a moderate ostial calcified lesion with focal stenosis 50-69%.  LCX is a large dominant artery that gives rise to two OM branches, PDA and PLA arteries. Proximal LCX artery has mild calcified plaque with associated stenosis 25-50%. Mid LCX artery has a moderate mixed, predominantly calcified almost circumferential plaque with associated stenosis 50-69% and possibly focal > 70% stenosis.  OM1 is a very small artery with mild plaque.  OM2 is a medium size artery with moderate ostial proximal calcified plaque with associated stenosis 50-69%.  PDA is small with limited visualization, no obvious plaque is seen.  PLA is very small and poorly visualized.  RCA is a small non-dominant artery that has mild calcified plaque in the proximal portion with associated stenosis 25-50%.  Other findings:  Normal pulmonary vein drainage into the left atrium.  Normal let atrial appendage without a thrombus.  Dilated pulmonary artery measuring 32 mm.  IMPRESSION: 1. Coronary calcium score of 1715. This was 75 percentile for age and sex matched control.  2. Normal coronary origin with left dominance.  3. Moderate diffuse plaque in the proximal and mid LAD, proximal second diagonal artery, mid LCX and proximal OM2 branch. There are severe diffuse calcifications that are affecting interpretation. Additional analysis with CT FFR will be performed.  4. Dilated pulmonary artery measuring 32 mm suspicious for pulmonary hypertension.   Electronically Signed By: Tobias Alexander On: 06/08/2017 17:54  Narrative EXAM: OVER-READ INTERPRETATION  CT CHEST  The following report is an over-read performed by radiologist Dr. Charlett Nose of [facility] on 06/08/2017. This over-read does not include interpretation of cardiac or coronary anatomy or pathology. The coronary CTA  interpretation by the cardiologist is attached.  COMPARISON:  None.  FINDINGS: Vascular: Aorta is normal caliber.  Heart is normal size.  Mediastinum/Nodes: No adenopathy in the lower mediastinum or hila.  Lungs/Pleura: Visualized lungs clear.  No effusions.  Upper Abdomen: Suspect fatty infiltration of the liver.  Musculoskeletal: Chest wall soft tissues are unremarkable. No acute bony abnormality.  IMPRESSION: No acute extra cardiac abnormality.  Fatty infiltration of the liver.  Electronically Signed: By: Charlett Nose M.D. On: 06/08/2017 09:55     ______________________________________________________________________________________________  Recent Labs: 07/05/2023: ALT 25; BUN 20; Creatinine, Ser 0.86; Hemoglobin 12.2; Platelets 198.0; Potassium 4.2; Sodium 137; TSH 2.63  Recent Lipid Panel    Component Value Date/Time   CHOL 114 04/13/2023 0900   CHOL 156 04/22/2022 1439   TRIG 67.0 04/13/2023 0900   HDL 45.00 04/13/2023 0900   HDL 53 04/22/2022 1439   CHOLHDL 3 04/13/2023 0900   VLDL 13.4 04/13/2023 0900   LDLCALC 55 04/13/2023 0900   LDLCALC 89 04/22/2022 1439    Physical Exam:    VS:  There were no vitals taken for this visit.    Wt Readings from Last 3 Encounters:  07/05/23 209 lb 9.6 oz (95.1 kg)  05/11/23 212 lb 6.4 oz (96.3 kg)  05/03/23 212 lb (96.2 kg)     GEN: *** Well nourished, well developed in no acute distress HEENT: Normal NECK: No JVD; No carotid bruits LYMPHATICS: No lymphadenopathy CARDIAC: ***RRR, no murmurs, rubs, gallops RESPIRATORY:  Clear to auscultation without rales, wheezing or rhonchi  ABDOMEN: Soft, non-tender, non-distended MUSCULOSKELETAL:  No edema; No deformity  SKIN: Warm and dry NEUROLOGIC:  Alert and oriented x 3 PSYCHIATRIC:  Normal affect    Signed, Reginald Herrlich, MD  07/07/2023 12:14 PM    [facility]

## 2023-07-08 ENCOUNTER — Other Ambulatory Visit (HOSPITAL_BASED_OUTPATIENT_CLINIC_OR_DEPARTMENT_OTHER): Payer: Self-pay

## 2023-07-08 ENCOUNTER — Ambulatory Visit: Payer: Commercial Managed Care - PPO | Admitting: "Endocrinology

## 2023-07-08 ENCOUNTER — Ambulatory Visit: Admitting: Cardiology

## 2023-07-08 ENCOUNTER — Encounter: Payer: Self-pay | Admitting: "Endocrinology

## 2023-07-08 ENCOUNTER — Ambulatory Visit

## 2023-07-08 ENCOUNTER — Other Ambulatory Visit: Payer: Self-pay

## 2023-07-08 VITALS — BP 130/80 | HR 65 | Ht 70.0 in | Wt 206.0 lb

## 2023-07-08 VITALS — BP 116/72 | HR 80 | Ht 70.0 in | Wt 207.2 lb

## 2023-07-08 DIAGNOSIS — Z7984 Long term (current) use of oral hypoglycemic drugs: Secondary | ICD-10-CM | POA: Diagnosis not present

## 2023-07-08 DIAGNOSIS — E1165 Type 2 diabetes mellitus with hyperglycemia: Secondary | ICD-10-CM

## 2023-07-08 DIAGNOSIS — I25110 Atherosclerotic heart disease of native coronary artery with unstable angina pectoris: Secondary | ICD-10-CM

## 2023-07-08 DIAGNOSIS — E7800 Pure hypercholesterolemia, unspecified: Secondary | ICD-10-CM

## 2023-07-08 LAB — POCT GLYCOSYLATED HEMOGLOBIN (HGB A1C): Hemoglobin A1C: 9.3 % — AB (ref 4.0–5.6)

## 2023-07-08 MED ORDER — EMPAGLIFLOZIN 10 MG PO TABS
10.0000 mg | ORAL_TABLET | Freq: Every day | ORAL | 3 refills | Status: DC
Start: 2023-07-08 — End: 2023-08-10
  Filled 2023-07-08: qty 30, 30d supply, fill #0
  Filled 2023-07-28 – 2023-08-05 (×2): qty 30, 30d supply, fill #1

## 2023-07-08 MED ORDER — AMLODIPINE BESYLATE 2.5 MG PO TABS
2.5000 mg | ORAL_TABLET | Freq: Every day | ORAL | 3 refills | Status: AC
  Filled 2023-07-08: qty 90, 90d supply, fill #0
  Filled 2023-09-29: qty 90, 90d supply, fill #1
  Filled 2024-01-03: qty 90, 90d supply, fill #2
  Filled 2024-04-06: qty 90, 90d supply, fill #3

## 2023-07-08 MED ORDER — VITAMIN D (ERGOCALCIFEROL) 1.25 MG (50000 UNIT) PO CAPS
50000.0000 [IU] | ORAL_CAPSULE | ORAL | 1 refills | Status: DC
  Filled 2023-07-08: qty 12, 84d supply, fill #0
  Filled 2023-10-04: qty 12, 84d supply, fill #1

## 2023-07-08 NOTE — Patient Instructions (Addendum)
 Medication Instructions:   START: Aspirin 81mg  1 tablet daily  START: Amlodipine 2.5mg  1 tablet daily     *If you need a refill on your cardiac medications before your next appointment, please call your pharmacy*   Lab Work: CBC, BMP- completed 07-05-23  If you have labs (blood work) drawn today and your tests are completely normal, you will receive your results only by: MyChart Message (if you have MyChart) OR A paper copy in the mail If you have any lab test that is abnormal or we need to change your treatment, we will call you to review the results.   Testing/Procedures:  Your physician has requested that you have an echocardiogram. Echocardiography is a painless test that uses sound waves to create images of your heart. It provides your doctor with information about the size and shape of your heart and how well your heart's chambers and valves are working. This procedure takes approximately one hour. There are no restrictions for this procedure. Please do NOT wear cologne, perfume, aftershave, or lotions (deodorant is allowed). Please arrive 15 minutes prior to your appointment time.  Please note: We ask at that you not bring children with you during ultrasound (echo/ vascular) testing. Due to room size and safety concerns, children are not allowed in the ultrasound rooms during exams. Our front office staff cannot provide observation of children in our lobby area while testing is being conducted. An adult accompanying a patient to their appointment will only be allowed in the ultrasound room at the discretion of the ultrasound technician under special circumstances. We apologize for any inconvenience.   [facility] A DEPT OF [facility] AT [place] [address] [place] [address] Dept: [phone] Loc: [phone]  Reginald Rios  07/08/2023  You are scheduled for a Cardiac Catheterization on Wednesday, March 12  with Dr. Lenon Ahmadi  1. Please arrive at the [facility] (Main Entrance A) at [facility]: [address] [place], [place] [address] at 9:30 AM (This time is 2 hour(s) before your procedure to ensure your preparation).   Free valet parking service is available. You will check in at ADMITTING. The support person will be asked to wait in the waiting room.  It is OK to have someone drop you off and come back when you are ready to be discharged.    Special note: Every effort is made to have your procedure done on time. Please understand that emergencies sometimes delay scheduled procedures.  2. Diet: Do not eat solid foods after midnight.  The patient may have clear liquids until 5am upon the day of the procedure.  3. Labs: Completed 07-05-23  4. Medication instructions in preparation for your procedure:   Contrast Allergy: No      Stop taking, Cozaar (Losartan) Wednesday, March 12,    Do not take Diabetes Med Glucophage (Metformin) on the day of the procedure and HOLD 48 HOURS AFTER THE PROCEDURE.  On the morning of your procedure, take your Aspirin 81 mg and any morning medicines NOT listed above.  You may use sips of water.  5. Plan to go home the same day, you will only stay overnight if medically necessary. 6. Bring a current list of your medications and current insurance cards. 7. You MUST have a responsible person to drive you home. 8. Someone MUST be with you the first 24 hours after you arrive home or your discharge will be delayed. 9. Please  wear clothes that are easy to get on and off and wear slip-on shoes.  Thank you for allowing [place] to care for you!   -- [facility]    Follow-Up: [facility], you and your health needs are our priority.  As part of our continuing mission to provide you with exceptional heart care, we have created designated Provider Care Teams.  These Care Teams include your primary Cardiologist (physician)  and Advanced Practice Providers (APPs -  Physician Assistants and Nurse Practitioners) who all work together to provide you with the care you need, when you need it.  We recommend signing up for the patient portal called "MyChart".  Sign up information is provided on this After Visit Summary.  MyChart is used to connect with patients for Virtual Visits (Telemedicine).  Patients are able to view lab/test results, encounter notes, upcoming appointments, etc.  Non-urgent messages can be sent to your provider as well.   To learn more about what you can do with MyChart, go to [email or web address].    Your next appointment:   3- 4  week(s)  The format for your next appointment:   In Person  Provider:   Huntley Dec, MD or Dr. Norman Herrlich   Other Instructions  Coronary Angiogram With Stent Coronary angiogram with stent placement is a procedure to widen or open a narrow blood vessel of the heart (coronary artery). Arteries may become blocked by cholesterol buildup (plaques) in the lining of the artery wall. When a coronary artery becomes partially blocked, blood flow to that area decreases. This may lead to chest pain or a heart attack (myocardial infarction). A stent is a small piece of metal that looks like mesh or spring. Stent placement may be done as treatment after a heart attack, or to prevent a heart attack if a blocked artery is found by a coronary angiogram. Let your health care provider know about: Any allergies you have, including allergies to medicines or contrast dye. All medicines you are taking, including vitamins, herbs, eye drops, creams, and over-the-counter medicines. Any problems you or family members have had with anesthetic medicines. Any blood disorders you have. Any surgeries you have had. Any medical conditions you have, including kidney problems or kidney failure. Whether you are pregnant or may be pregnant. Whether you are breastfeeding. What are the  risks? Generally, this is a safe procedure. However, serious problems may occur, including: Damage to nearby structures or organs, such as the heart, blood vessels, or kidneys. A return of blockage. Bleeding, infection, or bruising at the insertion site. A collection of blood under the skin (hematoma) at the insertion site. A blood clot in another part of the body. Allergic reaction to medicines or dyes. Bleeding into the abdomen (retroperitoneal bleeding). Stroke (rare). Heart attack (rare). What happens before the procedure? Staying hydrated Follow instructions from your health care provider about hydration, which may include: Up to 2 hours before the procedure - you may continue to drink clear liquids, such as water, clear fruit juice, black coffee, and plain tea.    Eating and drinking restrictions Follow instructions from your health care provider about eating and drinking, which may include: 8 hours before the procedure - stop eating heavy meals or foods, such as meat, fried foods, or fatty foods. 6 hours before the procedure - stop eating light meals or foods, such as toast or cereal. 2 hours before the procedure - stop drinking clear liquids. Medicines Ask your health care provider  about: Changing or stopping your regular medicines. This is especially important if you are taking diabetes medicines or blood thinners. Taking medicines such as aspirin and ibuprofen. These medicines can thin your blood. Do not take these medicines unless your health care provider tells you to take them. Generally, aspirin is recommended before a thin tube, called a catheter, is passed through a blood vessel and inserted into the heart (cardiac catheterization). Taking over-the-counter medicines, vitamins, herbs, and supplements. General instructions Do not use any products that contain nicotine or tobacco for at least 4 weeks before the procedure. These products include cigarettes, e-cigarettes, and  chewing tobacco. If you need help quitting, ask your health care provider. Plan to have someone take you home from the hospital or clinic. If you will be going home right after the procedure, plan to have someone with you for 24 hours. You may have tests and imaging procedures. Ask your health care provider: How your insertion site will be marked. Ask which artery will be used for the procedure. What steps will be taken to help prevent infection. These may include: Removing hair at the insertion site. Washing skin with a germ-killing soap. Taking antibiotic medicine. What happens during the procedure? An IV will be inserted into one of your veins. Electrodes may be placed on your chest to monitor your heart rate during the procedure. You will be given one or more of the following: A medicine to help you relax (sedative). A medicine to numb the area (local anesthetic) for catheter insertion. A small incision will be made for catheter insertion. The catheter will be inserted into an artery using a guide wire. The location may be in your groin, your wrist, or the fold of your arm (near your elbow). An X-ray procedure (fluoroscopy) will be used to help guide the catheter to the opening of the heart arteries. A dye will be injected into the catheter. X-rays will be taken. The dye helps to show where any narrowing or blockages are located in the arteries. Tell your health care provider if you have chest pain or trouble breathing. A tiny wire will be guided to the blocked spot, and a balloon will be inflated to make the artery wider. The stent will be expanded to crush the plaques into the wall of the vessel. The stent will hold the area open and improve the blood flow. Most stents have a drug coating to reduce the risk of the stent narrowing over time. The artery may be made wider using a drill, laser, or other tools that remove plaques. The catheter will be removed when the blood flow improves.  The stent will stay where it was placed, and the lining of the artery will grow over it. A bandage (dressing) will be placed on the insertion site. Pressure will be applied to stop bleeding. The IV will be removed. This procedure may vary among health care providers and hospitals.    What happens after the procedure? Your blood pressure, heart rate, breathing rate, and blood oxygen level will be monitored until you leave the hospital or clinic. If the procedure is done through the leg, you will lie flat in bed for a few hours or for as long as told by your health care provider. You will be instructed not to bend or cross your legs. The insertion site and the pulse in your foot or wrist will be checked often. You may have more blood tests, X-rays, and a test that records the electrical activity  of your heart (electrocardiogram, or ECG). Do not drive for 24 hours if you were given a sedative during your procedure. Summary Coronary angiogram with stent placement is a procedure to widen or open a narrowed coronary artery. This is done to treat heart problems. Before the procedure, let your health care provider know about all the medical conditions and surgeries you have or have had. This is a safe procedure. However, some problems may occur, including damage to nearby structures or organs, bleeding, blood clots, or allergies. Follow your health care provider's instructions about eating, drinking, medicines, and other lifestyle changes, such as quitting tobacco use before the procedure. This information is not intended to replace advice given to you by your health care provider. Make sure you discuss any questions you have with your health care provider. Document Revised: 11/09/2018 Document Reviewed: 11/09/2018 Elsevier Patient Education  2021 [employer].

## 2023-07-08 NOTE — Progress Notes (Signed)
 Cardiology Office Note:    Date:  07/08/2023   ID:  Reginald Rios, DOB [birth year], MRN [id number]  PCP:  Sharlene Dory, DO  Cardiologist:  Marlyn Corporal Geral Tuch, MD    Referring MD: Sharlene Dory*    ASSESSMENT:    1. Coronary artery disease involving native coronary artery of native heart with unstable angina pectoris [facility])   History of cath and PCI of LCx in February 2019. Last stress echocardiogram from June 2023 good exercise capacity 10 METS and no ischemia.  Now with progressive symptoms over the past month and a half concerning for unstable angina.  Continue Plavix 75 mg once daily. Advised him to start taking aspirin 81 mg once daily Advised him to avoid any moderate to heavy exertion. Advised to call 911 or go to the ER for any acutely worsening symptoms.  He does have sublingual nitroglycerin to use as needed.  Will obtain transthoracic echocardiogram for cardiac structure and function assessment.  Shared Decision Making/Informed Consent{ The risks [stroke (1 in 1000), death (1 in 1000), kidney failure [usually temporary] (1 in 500), bleeding (1 in 200), allergic reaction [possibly serious] (1 in 200)], benefits (diagnostic support and management of coronary artery disease) and alternatives of a cardiac catheterization were discussed in detail with him and he is willing to proceed.   Will schedule for cardiac catheterization tentatively at [facility].  Will add low-dose amlodipine 2.5 mg once daily for antianginal effect.  Return to clinic tentatively in 3 to 4 weeks postprocedure.   Medication Adjustments/Labs and Tests Ordered: Current medicines are reviewed at length with the patient today.  Concerns regarding medicines are outlined above.  No orders of the defined types were placed in this encounter.  No orders of the defined types were placed in this encounter.    History of Present Illness:    Reginald Rios is a 69  y.o. male follows up with Dr. Tomie China and last visit with him in the office was August 2024. His PCP 07/05/2023 with worsening exertional shortness of breath during physical activity.     With a hx of CAD with PCI and stent left circumflex coronary artery February 2019, type 2 diabetes, hyperlipidemia, sinus bradycardia, vertigo, chronic back pain.     Here for the visit by himself.  Mentions his functional status is baseline is limited due to back pain and vertigo.  For the past month and a half he has been noticing more shortness of breath with just walking his dog quarter of a mile which he routinely did before without any limitation.  Does not describe this as chest pain that feels he is out of breath and does not have any energy.  The symptoms remind him of symptoms back in 2019 prior to his cath and PCI.    Denies any orthopnea, paroxysmal nocturnal dyspnea, palpitations, syncope.  No blood in urine or stools.  Good compliance with his medications, takes Plavix consistently.  Has not been on.  But did not have any side effects to aspirin in the past while he took it.  Continues on rosuvastatin and Zetia for lipid lowering. Not on any beta-blockers due to baseline sinus bradycardia.  CBC showed a hemoglobin of 12.2 and hematocrit 36.5 stable in comparison to 11 months ago. CMP normal except for glucose of 290. BUN 20, creatinine 0.86, EGFR 87.6.  chest x-ray is performed and there is no results as of today.  Images reviewed by me today do not  appear to show any major significant cardiopulmonary abnormality.  However we will wait for final read from radiologist.  He had a stress echo performed there is 10/02/2021 no findings of EKG or echo ischemia low risk study very good exercise capacity 10 METS and had a normal heart rate and blood pressure response achieving 102% of age-predicted maximum heart rate.  Recent EKG at PCPs office 07-05-2023 noted sinus heart rate 69/min, normal PR  interval with normal QRS axis and QRS duration 93 ms.    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   BPPV (benign paroxysmal positional vertigo) 08/14/2014   CAD (coronary artery disease)    2/19 PCI/DESx1 to Lcx   Cervical disc disorder with radiculopathy of cervical region 10/07/2015   Cervical radicular pain 01/10/2019   Chronic cough 04/13/2019   Decreased cardiac ejection fraction 04/02/2015   Diabetes mellitus without complication (HCC)    Dyspnea on exertion 05/15/2015   Erectile dysfunction 07/11/2019   Exercise-induced asthma    Family history of early CAD 07/05/2017   Fatigue 05/15/2015   Hallux rigidus, right foot 09/15/2021   Heart murmur    Hemarthrosis of right elbow 02/03/2016   Hoarseness 04/13/2019   Hyperlipidemia    family hx of high cholesterol   Hypogonadism in male    Incomplete rotator cuff tear 09/10/2015   Injected 09/10/2015   Lateral epicondylitis of right elbow 07/28/2019   Low back pain at multiple sites 02/05/2021   Low vitamin D level 04/22/2017   Mild persistent asthma/cough variant asthma 04/13/2019   Myofascial pain syndrome, cervical 07/28/2019   Neck pain 06/01/2018   Nonischemic cardiomyopathy (HCC) 05/15/2015   Nontraumatic incomplete tear of right rotator cuff 07/25/2019   Occipital headache 06/01/2018   Onychomycosis 05/25/2018   Osteoarthritis    Pain in right ankle and joints of right foot 10/05/2016   Palpitations 07/06/2017   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   Shoulder bursitis 10/12/2014   Injected in 10/12/2014  Repeat 01/21/2015   Small  thenar eminence 02/03/2016   Spinal stenosis, lumbar region, without neurogenic claudication 10/12/2014   Spondylolisthesis at L5-S1 level 05/09/2021   Strain of latissimus dorsi muscle 01/21/2015   Tendinopathy of right biceps tendon 07/25/2019   Type 2 diabetes mellitus with hyperglycemia, without long-term current use of insulin (HCC) 12/22/2013   Ulnar neuropathy at elbow of right upper extremity 01/03/2019    Current Medications: Current Meds  Medication Sig   acetaminophen (TYLENOL) 325 MG tablet Take 325 mg by mouth every 4 (four) hours as needed.   albuterol (VENTOLIN HFA) 108 (90 Base) MCG/ACT inhaler Inhale 2 puffs by mouth every 4-6 hours as needed for coughing or wheezing spells   azelastine (ASTELIN) 0.1 % nasal spray Place 2 sprays into both nostrils 2 (two) times daily as needed for rhinitis (nasal congestion).   baclofen (LIORESAL) 10 MG tablet Take 1 tablet (10 mg total) by mouth 3 (three) times daily as needed for muscle spasms.   Blood Glucose Monitoring Suppl (FREESTYLE FREEDOM LITE) w/Device KIT Use to check blood sugar daily.   Calcium Carbonate-Vit D-Min (QC CALCIUM-MAGNESIUM-ZINC-D3 PO) Take 1 tablet  by mouth at bedtime.   clopidogrel (PLAVIX) 75 MG tablet Take 1 tablet (75 mg total) by mouth daily.   diclofenac Sodium (VOLTAREN) 1 % GEL Apply 2 g topically 4 (four) times daily.   ezetimibe (ZETIA) 10 MG tablet Take 1 tablet (10 mg total) by mouth daily.   fluticasone (FLONASE) 50 MCG/ACT nasal spray Place 2 sprays into both nostrils daily.   fluticasone (FLOVENT HFA) 110 MCG/ACT inhaler Inhale 2 puffs into the lungs 2 (two) times daily. Use with spacer. Rinse, gargle, and spit after use.   Fluticasone Furoate (ARNUITY ELLIPTA) 50 MCG/ACT AEPB Inhale 50 mcg into the lungs daily.   Glucagon (GVOKE HYPOPEN 1-PACK) 1 MG/0.2ML SOAJ Inject into the skin in the event of low sugar.   glucose blood (FREESTYLE LITE) test strip Use daily to check blood sugar.   glyBURIDE  (DIABETA) 2.5 MG tablet Take 1 tablet (2.5 mg total) by mouth daily with breakfast.   Lancets (FREESTYLE) lancets Use daily to check blood sugar   losartan (COZAAR) 25 MG tablet Take 1 tablet (25 mg total) by mouth daily.   meclizine (ANTIVERT) 25 MG tablet Take 1 tablet (25 mg total) by mouth 3 (three) times daily as needed for dizziness.   metFORMIN (GLUCOPHAGE) 1000 MG tablet Take 1 tablet (1,000 mg total) by mouth 2 (two) times daily with a meal.   methylphenidate (RITALIN) 20 MG tablet Take 1 tablet (20 mg total) by mouth 2 (two) times daily.   methylphenidate (RITALIN) 20 MG tablet Take 1 tablet (20 mg total) by mouth 2 (two) times daily.   methylphenidate (RITALIN) 20 MG tablet Take 1 tablet (20 mg total) by mouth 2 (two) times daily.   montelukast (SINGULAIR) 10 MG tablet Take 1 tablet (10 mg total) by mouth daily.   Multiple Vitamin (MULTIVITAMIN) tablet Take 1 tablet by mouth daily.   nitroGLYCERIN (NITROSTAT) 0.4 MG SL tablet Place 0.4 mg under the tongue every 5 (five) minutes as needed for chest pain.   pantoprazole (PROTONIX) 40 MG tablet Take 1 tablet (40 mg total) by mouth daily.   pioglitazone (ACTOS) 45 MG tablet Take 1 tablet (45 mg total) by mouth daily.   rosuvastatin (CRESTOR) 5 MG tablet Take 1 tablet (5 mg total) by mouth daily.   sildenafil (VIAGRA) 100 MG tablet Take 1 tablet (100 mg total) by mouth daily as needed. Take 1 hour prior to sexual activity   tadalafil (CIALIS) 20 MG tablet Take 0.5-1 tablets (10-20 mg total) by mouth every other day as needed for erectile dysfunction.   testosterone (ANDROGEL) 50 MG/5GM (1%) GEL APPLY 2 APPLICATIONS ON TO THE SKIN DAILY      EKGs/Labs/Other Studies Reviewed:    The following studies were reviewed today:  Cardiac Studies & Procedures   ______________________________________________________________________________________________ CARDIAC CATHETERIZATION  CARDIAC CATHETERIZATION 06/23/2017  Narrative Images from the  original result were not included.   CULPRIT LESION mid Cx to Dist Cx lesion is 80% stenosed.  A drug-eluting stent was successfully placed using a STENT SYNERGY DES 2.5X16. Postdilated and taper fashion from 3.1-2.6 mm  Post intervention, there is a 0% residual stenosis.  Prox LAD to Mid LAD lesion is 60% stenosed. --FFR 0.83. Not significant.  The ostial-proximal LAD and Circumflex have mild-moderate calcification without significant stenosis.  OM1 is a relatively small caliber vessel after gives off a branch and is not a good PCI target. No obvious lesions were noted to explain the FFR positive findings.  Successful PCI to distal  circumflex likely culprit lesion reducing 80% to 0% with a Synergy DES stent postdilated in tapered fashion. FFR not significant lesion of the LAD medical management -this correlates with CTA   Plan: Same day discharge after bed rest and TR band removal DAPT for minimum of 6 months, then okay to stop aspirin but continue Plavix for at least one year. Continue aggressive risk factor modification with lipid management.  Hold Janumet x 48 hr post Cath    Bryan Lemma, M.D., M.S. Interventional Cardiologist  Pager # [phone] Phone # [phone] [address] [place], [place] [address]  Findings Coronary Findings Diagnostic  Dominance: Left  Left Main Vessel is large. The vessel exhibits minimal luminal irregularities.  Left Anterior Descending Ost LAD to Prox LAD lesion is 10% stenosed. The lesion is moderately calcified. Prox LAD to Mid LAD lesion is 60% stenosed. The lesion is moderately calcified. Pressure wire/FFR was performed on the lesion. FFR: 0.83. XB LAD 3.5 Guide - Prowater Wire -&gt; Micronavvus Catheter, initial FFR 0.92 --&gt; IV Adenosine 116mcg/kg/min x 2 min --&gt; Final FFR 0.83 (NOT PHYSIOLOGICALLY SIGNIFICANT- confirms CTA FFR).  First Septal Branch Vessel is small in size.  Left Circumflex Vessel is  large. Ost Cx to Prox Cx lesion is 5% stenosed. The lesion is moderately calcified. Mid Cx to Dist Cx lesion is 80% stenosed. The lesion is tubular, eccentric and irregular.  First Obtuse Marginal Branch Vessel is moderate in size. Ost 1st Mrg lesion is 45% stenosed.  Lateral First Obtuse Marginal Branch Vessel is small in size.  Second Obtuse Marginal Branch Vessel is moderate in size. Ost 2nd Mrg lesion is 40% stenosed.  Left Posterior Descending Artery Vessel is moderate in size.  First Left Posterolateral Branch Vessel is small in size.  Right Coronary Artery Vessel is small. There is mild diffuse disease throughout the vessel. The vessel is mildly calcified.  Acute Marginal Branch Vessel is small in size.  Intervention  Mid Cx to Dist Cx lesion Stent Lesion length:  14 mm. Lesion crossed with guidewire using a WIRE ASAHI PROWATER 180CM. Pre-stent angioplasty was performed using a BALLOON SAPPHIRE 2.25X10. Maximum pressure:  12 atm. Inflation time:  20 sec. A drug-eluting stent was successfully placed using a STENT SYNERGY DES 2.5X16. Maximum pressure: 14 atm. Inflation time: 30 sec. Minimum lumen area:  3.1 mm. Stent strut is well apposed. Tapered 3.1-2.6 mm Post-stent angioplasty was performed using a BALLOON SAPPHIRE [place] 3.0X12. Maximum pressure:  14 atm. Inflation time:  30 sec. Post-Intervention Lesion Assessment The intervention was successful. Pre-interventional TIMI flow is 3. Post-intervention TIMI flow is 3. Treated lesion length:  16 mm. No complications occurred at this lesion. There is a 0% residual stenosis post intervention.   STRESS TESTS  ECHOCARDIOGRAM STRESS TEST 10/08/2021  Narrative EXERCISE STRESS ECHO REPORT   --------------------------------------------------------------------------------  Patient Name:   Reginald Rios Date of Exam: 10/08/2021 Medical Rec #:  [id number]       Height:       70.0 in Accession #:    [id number]      Weight:        197.4 lb Date of Birth:  [birth year]       BSA:          2.076 m Patient Age:    66 years        BP:           154/78 mmHg Patient Gender: M  HR:           70 bpm. Exam Location:  [address]  Procedure: 2D Echo, Stress Echo and Intracardiac Opacification Agent  Indications:    I25.10 Coronary artery disease  History:        Patient has prior history of Echocardiogram examinations, most recent 05/05/2017.  Sonographer:    Daphine Deutscher RDCS Referring Phys: [id number] BRIAN J MUNLEY  IMPRESSIONS   1. Excellent exercise capacity (8:00 min:s; 10 METS). Normal HR/BP response to exercise. 2. This is a negative stress echocardiogram for ischemia. 3. This is a low risk study.  FINDINGS  Exam Protocol: The patient exercised on a treadmill according to a Bruce protocol. Definity contrast agent was given IV to delineate the left ventricular endocardial borders.   Patient Performance: The patient exercised for 8 minutes and 0 seconds, achieving 10 METS. The maximum stage achieved was III of the Bruce protocol. The baseline heart rate was 80 bpm. The heart rate at peak stress was 155 bpm. The target heart rate was calculated to be 129 bpm. The percentage of maximum predicted heart rate achieved was 102.2 %. The baseline blood pressure was 154/78 mmHg. The blood pressure at peak stress was 194/69 mmHg. The blood pressure response was normal. The patient developed fatigue during the stress exam. The patient's functional capacity was excellent.  EKG: Resting EKG showed normal sinus rhythm with no abnormal findings. The patient developed no abnormal EKG findings during exercise.   2D Echo Findings: The baseline ejection fraction was 60%. The peak ejection fraction at stress was 80%. Baseline regional wall motion abnormalities were not present. There were no stress-induced wall motion abnormalities. This is a negative stress echocardiogram for ischemia.   Lennie Odor  MD Electronically signed on 10/08/2021 at 3:40:21 PM     Final   ECHOCARDIOGRAM  ECHOCARDIOGRAM COMPLETE 05/05/2017  Narrative *[facility]* [address] [place], [place] [address] [phone]  ------------------------------------------------------------------- Transthoracic Echocardiography  Patient:    Jaleal, Schliep MR #:       [id number] Study Date: 05/05/2017 Gender:     M Age:        55 Height:     177.8 cm Weight:     87.2 kg BSA:        2.09 m^2 Pt. Status: Room:  ATTENDING   Tobias Alexander, M.D. ORDERING    Tobias Alexander, M.D. REFERRING   Tobias Alexander, M.D. PERFORMING  [facility], Outpatient  cc:  ------------------------------------------------------------------- LV EF: 50% -   55%  ------------------------------------------------------------------- Indications:      R06.09 Dyspnea on exertion. R53.83 Fatigue.  ------------------------------------------------------------------- History:   PMH:  Non-ischemic cardiomyopathy. Asthma. Low back pain.  Murmur.  Risk factors:  Family history of coronary artery disease. Hypertension. Diabetes mellitus. Dyslipidemia.  ------------------------------------------------------------------- Study Conclusions  - Procedure narrative: Transthoracic echocardiography. Image quality was adequate. The study was technically difficult. - Left ventricle: The cavity size was normal. Systolic function was normal. The estimated ejection fraction was in the range of 50% to 55%. Wall motion was normal; there were no regional wall motion abnormalities. Left ventricular diastolic function parameters were normal. - Aortic valve: Transvalvular velocity was within the normal range. There was no stenosis. There was no regurgitation. - Mitral valve: Transvalvular velocity was within the normal range. There was no evidence for stenosis. There was trivial regurgitation. - Right ventricle: The cavity size was normal. Wall  thickness was normal. Systolic function was normal. - Tricuspid valve: There was trivial regurgitation. -  Pulmonary arteries: Systolic pressure was within the normal range. PA peak pressure: 27 mm Hg (S).  ------------------------------------------------------------------- Study data:  Comparison was made to the study of 07/03/2015.  Study status:  Routine.  Procedure:  Transthoracic echocardiography. Image quality was adequate. The study was technically difficult. Study completion:  There were no complications. Transthoracic echocardiography.  M-mode, complete 2D, spectral Doppler, and color Doppler.  Birthdate:  Patient birthdate: Aug 27, 1955.  Age:  Patient is 68 yr old.  Sex:  Gender: male. BMI: 27.6 kg/m^2.  Blood pressure:     115/69  Patient status: Outpatient.  Study date:  Study date: 05/05/2017. Study time: 09:01 AM.  Location:  [facility]  -------------------------------------------------------------------  ------------------------------------------------------------------- Left ventricle:  The cavity size was normal. Systolic function was normal. The estimated ejection fraction was in the range of 50% to 55%. Wall motion was normal; there were no regional wall motion abnormalities. The transmitral flow pattern was normal. The deceleration time of the early transmitral flow velocity was normal. The pulmonary vein flow pattern was normal. The tissue Doppler parameters were normal. Left ventricular diastolic function parameters were normal.  ------------------------------------------------------------------- Aortic valve:   Trileaflet; normal thickness leaflets. Mobility was not restricted.  Doppler:  Transvalvular velocity was within the normal range. There was no stenosis. There was no regurgitation.  ------------------------------------------------------------------- Aorta:  Aortic root: The aortic root was normal in  size.  ------------------------------------------------------------------- Mitral valve:   Structurally normal valve.   Mobility was not restricted.  Doppler:  Transvalvular velocity was within the normal range. There was no evidence for stenosis. There was trivial regurgitation.    Peak gradient (D): 4 mm Hg.  ------------------------------------------------------------------- Left atrium:  The atrium was normal in size.  ------------------------------------------------------------------- Right ventricle:  The cavity size was normal. Wall thickness was normal. Systolic function was normal.  ------------------------------------------------------------------- Pulmonic valve:    Structurally normal valve.   Cusp separation was normal.  Doppler:  Transvalvular velocity was within the normal range. There was no evidence for stenosis. There was no regurgitation.  ------------------------------------------------------------------- Tricuspid valve:   Structurally normal valve.    Doppler: Transvalvular velocity was within the normal range. There was trivial regurgitation.  ------------------------------------------------------------------- Pulmonary artery:   The main pulmonary artery was normal-sized. Systolic pressure was within the normal range.  ------------------------------------------------------------------- Right atrium:  The atrium was normal in size.  ------------------------------------------------------------------- Pericardium:  There was no pericardial effusion.  ------------------------------------------------------------------- Systemic veins: Inferior vena cava: The vessel was normal in size. The respirophasic diameter changes were in the normal range (>= 50%), consistent with normal central venous pressure.  ------------------------------------------------------------------- Measurements  Left ventricle                         Value        Reference LV ID,  ED, PLAX chordal                43.6  mm     43 - 52 LV ID, ES, PLAX chordal                34    mm     23 - 38 LV fx shortening, PLAX chordal (L)     22    %      >=29 LV PW thickness, ED                    9.11  mm     ---------- IVS/LV PW ratio, ED  1.15         <=1.3 LV e&', lateral                         10.5  cm/s   ---------- LV E/e&', lateral                       9.06         ---------- LV e&', medial                          7.87  cm/s   ---------- LV E/e&', medial                        12.08        ---------- LV e&', average                         9.19  cm/s   ---------- LV E/e&', average                       10.35        ----------  Ventricular septum                     Value        Reference IVS thickness, ED                      10.5  mm     ----------  LVOT                                   Value        Reference LVOT ID, S                             21    mm     ---------- LVOT area                              3.46  cm^2   ----------  Aorta                                  Value        Reference Aortic root ID, ED                     29    mm     ----------  Left atrium                            Value        Reference LA ID, A-P, ES                         35    mm     ---------- LA ID/bsa, A-P                         1.67  cm/m^2 <=2.2 LA volume, S  56.6  ml     ---------- LA volume/bsa, S                       27.1  ml/m^2 ---------- LA volume, ES, 1-p A4C                 58.1  ml     ---------- LA volume/bsa, ES, 1-p A4C             27.8  ml/m^2 ---------- LA volume, ES, 1-p A2C                 54.3  ml     ---------- LA volume/bsa, ES, 1-p A2C             26    ml/m^2 ----------  Mitral valve                           Value        Reference Mitral E-wave peak velocity            95.1  cm/s   ---------- Mitral A-wave peak velocity            86.8  cm/s   ---------- Mitral deceleration time       (H)     254   ms      150 - 230 Mitral peak gradient, D                4     mm Hg  ---------- Mitral E/A ratio, peak                 1            ----------  Pulmonary arteries                     Value        Reference PA pressure, S, DP                     27    mm Hg  <=30  Tricuspid valve                        Value        Reference Tricuspid regurg peak velocity         246   cm/s   ---------- Tricuspid peak RV-RA gradient          24    mm Hg  ----------  Right atrium                           Value        Reference RA ID, S-I, ES, A4C                    47.5  mm     34 - 49 RA area, ES, A4C                       11.9  cm^2   8.3 - 19.5 RA volume, ES, A/L                     24.8  ml     ---------- RA volume/bsa, ES, A/L  11.9  ml/m^2 ----------  Systemic veins                         Value        Reference Estimated CVP                          3     mm Hg  ----------  Right ventricle                        Value        Reference RV pressure, S, DP                     27    mm Hg  <=30 RV s&', lateral, S                      8.38  cm/s   ----------  Legend: (L)  and  (H)  mark values outside specified reference range.  ------------------------------------------------------------------- Prepared and Electronically Authenticated by  Chilton Si, MD 2019-01-02T10:27:02      CT SCANS  CT CORONARY FRACTIONAL FLOW RESERVE DATA PREP 06/08/2017  Narrative EXAM: FF/RCT ANALYSIS  FINDINGS: FFRct analysis was performed on the original cardiac CT angiogram dataset. Diagrammatic representation of the FFRct analysis is provided in a separate PDF document in PACS. This dictation was created using the PDF document and an interactive 3D model of the results. 3D model is not available in the EMR/PACS. Normal FFR range is >0.80.  1. Left Main:  No significant stenosis.  2. LAD: No significant stenosis. 3. D1: No significant stenosis. 4. LCX: Proximal: 0.89, mid LCX: 0.85,  distal LCX: 0.80. 5. OM1: Proximal: 0.83, distal: 0.77. 6. RCA: Small non-dominant.  IMPRESSION: 1. CT FFR analysis showed significant stenoses in the mid LCX and proximal OM1 arteries. A cardiac catheterization is recommended.   Electronically Signed By: Tobias Alexander On: 06/08/2017 17:58   CT CORONARY MORPH W/CTA COR W/SCORE 06/08/2017  Addendum 06/08/2017  5:57 PM ADDENDUM REPORT: 06/08/2017 17:54  CLINICAL DATA:  46 -year-old male with fatigue and exertional dyspnea.  EXAM: Cardiac/Coronary  CT  TECHNIQUE: The patient was scanned on a [employer].  FINDINGS: A 120 kV prospective scan was triggered in the descending thoracic aorta at 111 HU's. Axial non-contrast 3 mm slices were carried out through the heart. The data set was analyzed on a dedicated work station and scored using the Agatson method. Gantry rotation speed was 250 msecs and collimation was .6 mm. No beta blockade and 0.8 mg of sl NTG was given. The 3D data set was reconstructed in 5% intervals of the 67-82 % of the R-R cycle. Diastolic phases were analyzed on a dedicated work station using MPR, MIP and VRT modes. The patient received 80 cc of contrast.  Aorta:  Normal size.  No calcifications.  No dissection.  Aortic Valve:  Trileaflet.  No calcifications.  Coronary Arteries:  Normal coronary origin.  Left dominance.  Left main is a large artery that gives rise to LAD and LCX arteries. LM has no plaque.  LAD is a large vessel that gives rise to two diagonal arteries. Proximal LADS has moderate diffuse almost circumferential plaque with stenosis 25-50% and a focal stenosis of 50-69% at the takeoff of second diagonal artery. Mid LAD has a moderate diffuse predominantly calcified plaque that is almost  circumferential with stenosis 50-69%.  D1 is very small with no obvious plaque.  D2 has a moderate ostial calcified lesion with focal stenosis 50-69%.  LCX is a large dominant artery  that gives rise to two OM branches, PDA and PLA arteries. Proximal LCX artery has mild calcified plaque with associated stenosis 25-50%. Mid LCX artery has a moderate mixed, predominantly calcified almost circumferential plaque with associated stenosis 50-69% and possibly focal > 70% stenosis.  OM1 is a very small artery with mild plaque.  OM2 is a medium size artery with moderate ostial proximal calcified plaque with associated stenosis 50-69%.  PDA is small with limited visualization, no obvious plaque is seen.  PLA is very small and poorly visualized.  RCA is a small non-dominant artery that has mild calcified plaque in the proximal portion with associated stenosis 25-50%.  Other findings:  Normal pulmonary vein drainage into the left atrium.  Normal let atrial appendage without a thrombus.  Dilated pulmonary artery measuring 32 mm.  IMPRESSION: 1. Coronary calcium score of 1715. This was 60 percentile for age and sex matched control.  2. Normal coronary origin with left dominance.  3. Moderate diffuse plaque in the proximal and mid LAD, proximal second diagonal artery, mid LCX and proximal OM2 branch. There are severe diffuse calcifications that are affecting interpretation. Additional analysis with CT FFR will be performed.  4. Dilated pulmonary artery measuring 32 mm suspicious for pulmonary hypertension.   Electronically Signed By: Tobias Alexander On: 06/08/2017 17:54  Narrative EXAM: OVER-READ INTERPRETATION  CT CHEST  The following report is an over-read performed by radiologist Dr. Charlett Nose of [facility] on 06/08/2017. This over-read does not include interpretation of cardiac or coronary anatomy or pathology. The coronary CTA interpretation by the cardiologist is attached.  COMPARISON:  None.  FINDINGS: Vascular: Aorta is normal caliber.  Heart is normal size.  Mediastinum/Nodes: No adenopathy in the lower mediastinum or  hila.  Lungs/Pleura: Visualized lungs clear.  No effusions.  Upper Abdomen: Suspect fatty infiltration of the liver.  Musculoskeletal: Chest wall soft tissues are unremarkable. No acute bony abnormality.  IMPRESSION: No acute extra cardiac abnormality.  Fatty infiltration of the liver.  Electronically Signed: By: Charlett Nose M.D. On: 06/08/2017 09:55     ______________________________________________________________________________________________          Recent Labs: 07/05/2023: ALT 25; BUN 20; Creatinine, Ser 0.86; Hemoglobin 12.2; Platelets 198.0; Potassium 4.2; Sodium 137; TSH 2.63  Recent Lipid Panel    Component Value Date/Time   CHOL 114 04/13/2023 0900   CHOL 156 04/22/2022 1439   TRIG 67.0 04/13/2023 0900   HDL 45.00 04/13/2023 0900   HDL 53 04/22/2022 1439   CHOLHDL 3 04/13/2023 0900   VLDL 13.4 04/13/2023 0900   LDLCALC 55 04/13/2023 0900   LDLCALC 89 04/22/2022 1439    Physical Exam:    VS:  BP 116/72   Pulse 80   Ht 5\' 10"  (1.778 m)   Wt 207 lb 3.2 oz (94 kg)   SpO2 97%   BMI 29.73 kg/m     Wt Readings from Last 3 Encounters:  07/08/23 207 lb 3.2 oz (94 kg)  07/05/23 209 lb 9.6 oz (95.1 kg)  05/11/23 212 lb 6.4 oz (96.3 kg)    General: alert, awake, oriented to Patient oriented to: self, place, time, and situation Neck: No Jugular venous distension. No carotid bruit. Heart: Exam; heart brief: Regular rate and rhythm, S1S2 present, or without murmur or extra heart  sounds Extremities: pedal edema: none.  Pulses: bilateral radial pulses normal pedal pulses normal both DP's and PT's   Signed, Luretha Murphy, MD  07/08/2023 11:15 AM    [facility]

## 2023-07-08 NOTE — Progress Notes (Signed)
 Outpatient Endocrinology Note Reginald [place], MD  07/08/23   Reginald Rios [birth year] [id number]  Referring Provider: Sharlene Rios* Primary Care Provider: Sharlene Dory, DO Reason for consultation: Subjective   Assessment & Plan  Diagnoses and all orders for this visit:  Type 2 diabetes mellitus with hyperglycemia, without long-term current use of insulin (HCC) -     POCT glycosylated hemoglobin (Hb A1C)  Pure hypercholesterolemia  Other orders -     empagliflozin (JARDIANCE) 10 MG TABS tablet; Take 1 tablet (10 mg total) by mouth daily before breakfast.    Diabetes Type II complicated by hyperglycemia,  Lab Results  Component Value Date   GFR 87.96 07/05/2023   Hba1c goal less than 7, current Hba1c is  Lab Results  Component Value Date   HGBA1C 9.3 (A) 07/08/2023   Will recommend the following: Glyburide 2.5 mg every day Metformin 1000 mg bid Actos 45 mg every day   No history of heart attack/heart failure/pedal edema/urinary bladder cancer  No known contraindications/side effects to any of above medications  -Last LD and Tg are as follows: Lab Results  Component Value Date   LDLCALC 55 04/13/2023    Lab Results  Component Value Date   TRIG 67.0 04/13/2023   -On rosuvastatin 5 mg every day, ezetimibe 10 mg qd (has cramps with statin before)  -Follow low fat diet and exercise   -Blood pressure goal <140/90 - Microalbumin/creatinine goal is < 30 -Last MA/Cr is as follows: Lab Results  Component Value Date   MICROALBUR 2.1 (H) 07/21/2022   -on ACE/ARB losartan 25 mg qd -diet changes including salt restriction -limit eating outside -counseled BP targets per standards of diabetes care -uncontrolled blood pressure can lead to retinopathy, nephropathy and cardiovascular and atherosclerotic heart disease  Reviewed and counseled on: -A1C target -Blood sugar targets -Complications of uncontrolled diabetes  -Checking blood  sugar before meals and bedtime and bring log next visit -All medications with mechanism of action and side effects -Hypoglycemia management: rule of 15's, Glucagon Emergency Kit and medical alert ID -low-carb low-fat plate-method diet -At least 20 minutes of physical activity per day -Annual dilated retinal eye exam and foot exam -compliance and follow up needs -follow up as scheduled or earlier if problem gets worse  Call if blood sugar is less than 70 or consistently above 250    Take a 15 gm snack of carbohydrate at bedtime before you go to sleep if your blood sugar is less than 100.    If you are going to fast after midnight for a test or procedure, ask your physician for instructions on how to reduce/decrease your insulin dose.    Call if blood sugar is less than 70 or consistently above 250  -Treating a low sugar by rule of 15  (15 gms of sugar every 15 min until sugar is more than 70) If you feel your sugar is low, test your sugar to be sure If your sugar is low (less than 70), then take 15 grams of a fast acting Carbohydrate (3-4 glucose tablets or glucose gel or 4 ounces of juice or regular soda) Recheck your sugar 15 min after treating low to make sure it is more than 70 If sugar is still less than 70, treat again with 15 grams of carbohydrate          Don't drive the hour of hypoglycemia  If unconscious/unable to eat or drink by mouth, use glucagon injection or nasal  spray baqsimi and call 911. Can repeat again in 15 min if still unconscious.  Return in about 4 weeks (around 08/05/2023).   I have reviewed current medications, nurse's notes, allergies, vital signs, past medical and surgical history, family medical history, and social history for this encounter. Counseled patient on symptoms, examination findings, lab findings, imaging results, treatment decisions and monitoring and prognosis. The patient understood the recommendations and agrees with the treatment plan. All  questions regarding treatment plan were fully answered.  Reginald [place], MD  07/08/23    History of Present Illness Reginald Rios is a 68 y.o. year old male who presents for evaluation of Type II diabetes mellitus.  Reginald Rios was first diagnosed around 2015.  Diabetes education +  Home diabetes regimen: Glyburide 2.5 mg every day Metformin 1000 mg bid Actos 45 mg every day   COMPLICATIONS -  MI/Stroke -  retinopathy -  neuropathy -  nephropathy  SYMPTOMS REVIEWED - Polyuria - Weight loss - Blurred vision  BLOOD SUGAR DATA 151-356  Physical Exam  BP 130/80 (BP Location: Left Arm, Patient Position: Sitting)   Pulse 65   Ht 5\' 10"  (1.778 m)   Wt 206 lb (93.4 kg)   SpO2 95%   BMI 29.56 kg/m    Constitutional: well developed, well nourished Head: normocephalic, atraumatic Eyes: sclera anicteric, no redness Neck: supple Lungs: normal respiratory effort Neurology: alert and oriented Skin: dry, no appreciable rashes Musculoskeletal: no appreciable defects Psychiatric: normal mood and affect Diabetic Foot Exam - Simple   No data filed      Current Medications Patient's Medications  New Prescriptions   EMPAGLIFLOZIN (JARDIANCE) 10 MG TABS TABLET    Take 1 tablet (10 mg total) by mouth daily before breakfast.  Previous Medications   ACETAMINOPHEN (TYLENOL) 325 MG TABLET    Take 325 mg by mouth every 4 (four) hours as needed.   ALBUTEROL (VENTOLIN HFA) 108 (90 BASE) MCG/ACT INHALER    Inhale 2 puffs by mouth every 4-6 hours as needed for coughing or wheezing spells   AMLODIPINE (NORVASC) 2.5 MG TABLET    Take 1 tablet (2.5 mg total) by mouth daily.   AZELASTINE (ASTELIN) 0.1 % NASAL SPRAY    Place 2 sprays into both nostrils 2 (two) times daily as needed for rhinitis (nasal congestion).   BACLOFEN (LIORESAL) 10 MG TABLET    Take 1 tablet (10 mg total) by mouth 3 (three) times daily as needed for muscle spasms.   BLOOD GLUCOSE MONITORING SUPPL (FREESTYLE  FREEDOM LITE) W/DEVICE KIT    Use to check blood sugar daily.   CALCIUM CARBONATE-VIT D-MIN (QC CALCIUM-MAGNESIUM-ZINC-D3 PO)    Take 1 tablet by mouth at bedtime.   CLOPIDOGREL (PLAVIX) 75 MG TABLET    Take 1 tablet (75 mg total) by mouth daily.   DICLOFENAC SODIUM (VOLTAREN) 1 % GEL    Apply 2 g topically 4 (four) times daily.   EZETIMIBE (ZETIA) 10 MG TABLET    Take 1 tablet (10 mg total) by mouth daily.   FLUTICASONE (FLONASE) 50 MCG/ACT NASAL SPRAY    Place 2 sprays into both nostrils daily.   FLUTICASONE (FLOVENT HFA) 110 MCG/ACT INHALER    Inhale 2 puffs into the lungs 2 (two) times daily. Use with spacer. Rinse, gargle, and spit after use.   FLUTICASONE FUROATE (ARNUITY ELLIPTA) 50 MCG/ACT AEPB    Inhale 50 mcg into the lungs daily.   GLUCAGON (GVOKE HYPOPEN 1-PACK) 1 MG/0.2ML SOAJ  Inject into the skin in the event of low sugar.   GLUCOSE BLOOD (FREESTYLE LITE) TEST STRIP    Use daily to check blood sugar.   GLYBURIDE (DIABETA) 2.5 MG TABLET    Take 1 tablet (2.5 mg total) by mouth daily with breakfast.   LANCETS (FREESTYLE) LANCETS    Use daily to check blood sugar   LOSARTAN (COZAAR) 25 MG TABLET    Take 1 tablet (25 mg total) by mouth daily.   MECLIZINE (ANTIVERT) 25 MG TABLET    Take 1 tablet (25 mg total) by mouth 3 (three) times daily as needed for dizziness.   METFORMIN (GLUCOPHAGE) 1000 MG TABLET    Take 1 tablet (1,000 mg total) by mouth 2 (two) times daily with a meal.   METHYLPHENIDATE (RITALIN) 20 MG TABLET    Take 1 tablet (20 mg total) by mouth 2 (two) times daily.   METHYLPHENIDATE (RITALIN) 20 MG TABLET    Take 1 tablet (20 mg total) by mouth 2 (two) times daily.   METHYLPHENIDATE (RITALIN) 20 MG TABLET    Take 1 tablet (20 mg total) by mouth 2 (two) times daily.   MONTELUKAST (SINGULAIR) 10 MG TABLET    Take 1 tablet (10 mg total) by mouth daily.   MULTIPLE VITAMIN (MULTIVITAMIN) TABLET    Take 1 tablet by mouth daily.   NITROGLYCERIN (NITROSTAT) 0.4 MG SL TABLET     Place 0.4 mg under the tongue every 5 (five) minutes as needed for chest pain.   PANTOPRAZOLE (PROTONIX) 40 MG TABLET    Take 1 tablet (40 mg total) by mouth daily.   PIOGLITAZONE (ACTOS) 45 MG TABLET    Take 1 tablet (45 mg total) by mouth daily.   ROSUVASTATIN (CRESTOR) 5 MG TABLET    Take 1 tablet (5 mg total) by mouth daily.   SILDENAFIL (VIAGRA) 100 MG TABLET    Take 1 tablet (100 mg total) by mouth daily as needed. Take 1 hour prior to sexual activity   TADALAFIL (CIALIS) 20 MG TABLET    Take 0.5-1 tablets (10-20 mg total) by mouth every other day as needed for erectile dysfunction.   TESTOSTERONE (ANDROGEL) 50 MG/5GM (1%) GEL    APPLY 2 APPLICATIONS ON TO THE SKIN DAILY  Modified Medications   No medications on file  Discontinued Medications   No medications on file    Allergies Allergies  Allergen Reactions   Invokana [Canagliflozin] Rash   Grass Pollen(K-O-R-T-Swt Vern)    Lisinopril Cough   Semaglutide Nausea Only    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   BPPV (benign paroxysmal positional vertigo) 08/14/2014   CAD (coronary artery disease)    2/19 PCI/DESx1 to Lcx   Cervical disc disorder with radiculopathy of cervical region 10/07/2015   Cervical radicular pain 01/10/2019   Chronic cough 04/13/2019   Decreased cardiac ejection fraction 04/02/2015   Diabetes mellitus without complication (HCC)    Dyspnea on exertion 05/15/2015   Erectile dysfunction 07/11/2019   Exercise-induced asthma    Family history of early  CAD  07/05/2017   Fatigue 05/15/2015   Hallux rigidus, right foot 09/15/2021   Heart murmur    Hemarthrosis of right elbow 02/03/2016   Hoarseness 04/13/2019   Hyperlipidemia    family hx of high cholesterol   Hypogonadism in male    Incomplete rotator cuff tear 09/10/2015   Injected 09/10/2015   Lateral epicondylitis of right elbow 07/28/2019   Low back pain at multiple sites 02/05/2021   Low vitamin D level 04/22/2017   Mild persistent asthma/cough variant asthma 04/13/2019   Myofascial pain syndrome, cervical 07/28/2019   Neck pain 06/01/2018   Nonischemic cardiomyopathy (HCC) 05/15/2015   Nontraumatic incomplete tear of right rotator cuff 07/25/2019   Occipital headache 06/01/2018   Onychomycosis 05/25/2018   Osteoarthritis    Pain in right ankle and joints of right foot 10/05/2016   Palpitations 07/06/2017   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   Shoulder bursitis 10/12/2014   Injected in 10/12/2014  Repeat 01/21/2015   Small thenar eminence 02/03/2016   Spinal stenosis, lumbar region, without neurogenic claudication 10/12/2014   Spondylolisthesis at L5-S1 level 05/09/2021   Strain of latissimus dorsi muscle 01/21/2015   Tendinopathy of right biceps tendon 07/25/2019   Type 2 diabetes mellitus with hyperglycemia, without long-term current use of insulin (HCC) 12/22/2013   Ulnar neuropathy at elbow of right upper extremity 01/03/2019    Past Surgical History Past Surgical History:  Procedure Laterality Date   CORONARY PRESSURE/FFR STUDY N/A 06/23/2017   Procedure: INTRAVASCULAR PRESSURE WIRE/FFR STUDY;  Surgeon: Marykay Lex, MD;  Location: [facility] INVASIVE CV LAB;  Service: Cardiovascular;  Laterality: N/A;   CORONARY STENT INTERVENTION N/A 06/23/2017   Procedure: CORONARY STENT INTERVENTION;  Surgeon: Marykay Lex, MD;  Location: [facility] INVASIVE CV LAB;  Service: Cardiovascular;  Laterality: N/A;   KNEE ARTHROSCOPY     LEFT HEART CATH AND CORONARY ANGIOGRAPHY N/A 06/23/2017    Procedure: LEFT HEART CATH AND CORONARY ANGIOGRAPHY;  Surgeon: Marykay Lex, MD;  Location: [facility] INVASIVE CV LAB;  Service: Cardiovascular;  Laterality: N/A;   SHOULDER ARTHROSCOPY WITH BICEPSTENOTOMY Right 09/06/2019   Procedure: SHOULDER ARTHROSCOPY WITH BICEPSTENOTOMY;  Surgeon: Tarry Kos, MD;  Location: [facility];  Service: Orthopedics;  Laterality: Right;   SHOULDER ARTHROSCOPY WITH SUBACROMIAL DECOMPRESSION Right 09/06/2019   Procedure: RIGHT SHOULDER ARTHROSCOPY WITH EXTENSIVE DEBRIDEMENT, SUBACROMIAL DECOMPRESSION, BICEPS TENOTOMY;  Surgeon: Tarry Kos, MD;  Location: [facility];  Service: Orthopedics;  Laterality: Right;   TENOTOMY ACHILLES TENDON      Family History family history includes Asthma in his paternal aunt; Cancer in his mother; Coronary artery disease (age of onset: 76) in his father; Food Allergy in his son; Heart disease in his father; Hypothyroidism in his brother; Lung cancer (age of onset: 29) in his mother.  Social History Social History   Socioeconomic History   Marital status: Married    Spouse name: Not on file   Number of children: Not on file   Years of education: Not on file   Highest education level: Some college, no degree  Occupational History   Occupation: Conservation officer, historic buildings   Tobacco Use   Smoking status: Never   Smokeless tobacco: Never  Vaping Use   Vaping status: Never Used  Substance and Sexual Activity   Alcohol use: Yes    Alcohol/week: 2.0 standard drinks of alcohol    Types: 2 Glasses of wine per week    Comment: rare   Drug use: No  Sexual activity: Not on file  Other Topics Concern   Not on file  Social History Narrative   Previously worked as Environmental manager   Currently working at [employer]   Married 30 years   2 children ages 6, 8   Wife is psychiatrist - Dr. Annitta Needs   Moved from [place]   Grew up in [place]   Social Drivers of Health   Financial Resource Strain: Low Risk   (05/07/2023)   Overall Financial Resource Strain (CARDIA)    Difficulty of Paying Living Expenses: Not hard at all  Food Insecurity: No Food Insecurity (05/07/2023)   Hunger Vital Sign    Worried About Running Out of Food in the Last Year: Never true    Ran Out of Food in the Last Year: Never true  Transportation Needs: No Transportation Needs (05/07/2023)   PRAPARE - Administrator, Civil Service (Medical): No    Lack of Transportation (Non-Medical): No  Physical Activity: Sufficiently Active (05/07/2023)   Exercise Vital Sign    Days of Exercise per Week: 7 days    Minutes of Exercise per Session: 30 min  Stress: No Stress Concern Present (05/07/2023)   [employer] of Occupational Health - Occupational Stress Questionnaire    Feeling of Stress : Not at all  Social Connections: Socially Integrated (05/07/2023)   Social Connection and Isolation Panel [NHANES]    Frequency of Communication with Friends and Family: More than three times a week    Frequency of Social Gatherings with Friends and Family: Once a week    Attends Religious Services: 1 to 4 times per year    Active Member of Clubs or Organizations: Yes    Attends Banker Meetings: 1 to 4 times per year    Marital Status: Married  Intimate Partner Violence: Not on file    Lab Results  Component Value Date   HGBA1C 9.3 (A) 07/08/2023   HGBA1C 8.0 (H) 01/26/2023   HGBA1C 7.8 (H) 10/20/2022   Lab Results  Component Value Date   CHOL 114 04/13/2023   Lab Results  Component Value Date   HDL 45.00 04/13/2023   Lab Results  Component Value Date   LDLCALC 55 04/13/2023   Lab Results  Component Value Date   TRIG 67.0 04/13/2023   Lab Results  Component Value Date   CHOLHDL 3 04/13/2023   Lab Results  Component Value Date   CREATININE 0.86 07/05/2023   Lab Results  Component Value Date   GFR 87.96 07/05/2023   Lab Results  Component Value Date   MICROALBUR 2.1 (H) 07/21/2022       Component Value Date/Time   NA 137 07/05/2023 1614   NA 139 04/22/2022 1439   K 4.2 07/05/2023 1614   CL 103 07/05/2023 1614   CO2 27 07/05/2023 1614   GLUCOSE 290 (H) 07/05/2023 1614   BUN 20 07/05/2023 1614   BUN 12 04/22/2022 1439   CREATININE 0.86 07/05/2023 1614   CREATININE 0.82 07/05/2023 1008   CALCIUM 9.3 07/05/2023 1614   PROT 7.0 07/05/2023 1614   PROT 6.8 04/22/2022 1439   ALBUMIN 4.1 07/05/2023 1614   ALBUMIN 4.6 04/22/2022 1439   AST 16 07/05/2023 1614   AST 19 03/06/2022 1331   ALT 25 07/05/2023 1614   ALT 20 03/06/2022 1331   ALKPHOS 56 07/05/2023 1614   BILITOT 0.4 07/05/2023 1614   BILITOT 0.3 04/22/2022 1439   BILITOT 0.4 03/06/2022 1331  GFRNONAA >60 03/06/2022 1331   GFRAA >60 08/30/2019 1300      Latest Ref Rng & Units 07/05/2023    4:14 PM 07/05/2023   10:08 AM 07/21/2022   10:14 AM  BMP  Glucose 70 - 99 mg/dL 161  096  045   BUN 6 - 23 mg/dL 20  17  20    Creatinine 0.40 - 1.50 mg/dL 4.09  8.11  9.14   BUN/Creat Ratio 6 - 22 (calc)  SEE NOTE:    Sodium 135 - 145 mEq/L 137  139  141   Potassium 3.5 - 5.1 mEq/L 4.2  4.0  4.4   Chloride 96 - 112 mEq/L 103  104  104   CO2 19 - 32 mEq/L 27  28  27    Calcium 8.4 - 10.5 mg/dL 9.3  9.2  9.9        Component Value Date/Time   WBC 4.6 07/05/2023 1614   RBC 3.65 (L) 07/05/2023 1614   HGB 12.2 (L) 07/05/2023 1614   HGB 12.5 (L) 03/06/2022 1331   HGB 13.1 06/11/2017 1302   HCT 36.5 (L) 07/05/2023 1614   HCT 38.8 06/11/2017 1302   PLT 198.0 07/05/2023 1614   PLT 227 03/06/2022 1331   PLT 235 06/11/2017 1302   MCV 100.1 (H) 07/05/2023 1614   MCV 96 06/11/2017 1302   MCH 33.1 03/06/2022 1331   MCHC 33.4 07/05/2023 1614   RDW 14.6 07/05/2023 1614   RDW 14.7 06/11/2017 1302   LYMPHSABS 1.5 07/05/2023 1614   LYMPHSABS 1.9 04/21/2017 1019   MONOABS 0.4 07/05/2023 1614   EOSABS 0.1 07/05/2023 1614   EOSABS 0.1 04/21/2017 1019   BASOSABS 0.1 07/05/2023 1614   BASOSABS 0.0 04/21/2017 1019     Parts  of this note may have been dictated using voice recognition software. There may be variances in spelling and vocabulary which are unintentional. Not all errors are proofread. Please notify the Thereasa Parkin if any discrepancies are noted or if the meaning of any statement is not clear.

## 2023-07-08 NOTE — Assessment & Plan Note (Signed)
 History of cath and PCI of LCx in February 2019. Last stress echocardiogram from June 2023 good exercise capacity 10 METS and no ischemia.  Now with progressive symptoms over the past month and a half concerning for unstable angina.  Continue Plavix 75 mg once daily. Advised him to start taking aspirin 81 mg once daily Advised him to avoid any moderate to heavy exertion. Advised to call 911 or go to the ER for any acutely worsening symptoms.  He does have sublingual nitroglycerin to use as needed.  Will obtain transthoracic echocardiogram for cardiac structure and function assessment.  Shared Decision Making/Informed Consent{ The risks [stroke (1 in 1000), death (1 in 1000), kidney failure [usually temporary] (1 in 500), bleeding (1 in 200), allergic reaction [possibly serious] (1 in 200)], benefits (diagnostic support and management of coronary artery disease) and alternatives of a cardiac catheterization were discussed in detail with him and he is willing to proceed.   Will schedule for cardiac catheterization tentatively at [facility].  Will add low-dose amlodipine 2.5 mg once daily for antianginal effect.

## 2023-07-08 NOTE — Patient Instructions (Signed)

## 2023-07-09 ENCOUNTER — Other Ambulatory Visit (HOSPITAL_BASED_OUTPATIENT_CLINIC_OR_DEPARTMENT_OTHER): Payer: Self-pay

## 2023-07-09 ENCOUNTER — Encounter: Payer: Self-pay | Admitting: Family Medicine

## 2023-07-09 ENCOUNTER — Ambulatory Visit: Admitting: Family Medicine

## 2023-07-09 VITALS — BP 126/78 | HR 80 | Temp 97.8°F | Ht 70.0 in | Wt 207.2 lb

## 2023-07-09 DIAGNOSIS — E559 Vitamin D deficiency, unspecified: Secondary | ICD-10-CM

## 2023-07-09 DIAGNOSIS — D519 Vitamin B12 deficiency anemia, unspecified: Secondary | ICD-10-CM

## 2023-07-09 MED ORDER — CYANOCOBALAMIN 1000 MCG/ML IJ SOLN
1000.0000 ug | Freq: Once | INTRAMUSCULAR | Status: AC
  Administered 2023-07-09: 1000 ug via INTRAMUSCULAR

## 2023-07-09 NOTE — Patient Instructions (Addendum)
 Give me about a week to get the results of the labs back.   Let us know if you need anything.

## 2023-07-09 NOTE — Progress Notes (Signed)
 Chief Complaint  Patient presents with   Follow-up    Patient presents today for a follow-up appointment.    Subjective: Patient is a 68 y.o. male here for f/u.  Patient has had a 1 month history of shortness of breath and fatigue.  He saw another physician earlier in the week.  Slightly low vitamin D and vitamin B12.  He has an associated macrocytic anemia which are both new since his most recent check.  He is also following closely with his cardiologist.  He has a heart catheterization scheduled next week.  No current chest pain.  Shortness of breath more with exertion.  Sugars have historically not been well-controlled and he is seeing the endocrinology team for this.  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   BPPV (benign paroxysmal positional vertigo) 08/14/2014   CAD (coronary artery disease)    2/19 PCI/DESx1 to Lcx   Cervical disc disorder with radiculopathy of cervical region 10/07/2015   Cervical radicular pain 01/10/2019   Chronic cough 04/13/2019   Decreased cardiac ejection fraction 04/02/2015   Diabetes mellitus without complication (HCC)    Dyspnea on exertion 05/15/2015   Erectile dysfunction 07/11/2019   Exercise-induced asthma    Family history of early CAD 07/05/2017   Fatigue 05/15/2015   Hallux rigidus, right foot 09/15/2021   Heart murmur    Hemarthrosis of right elbow 02/03/2016   Hoarseness 04/13/2019   Hyperlipidemia    family hx of high cholesterol   Hypogonadism in male    Incomplete rotator cuff tear 09/10/2015   Injected 09/10/2015   Lateral epicondylitis of  right elbow 07/28/2019   Low back pain at multiple sites 02/05/2021   Low vitamin D level 04/22/2017   Mild persistent asthma/cough variant asthma 04/13/2019   Myofascial pain syndrome, cervical 07/28/2019   Neck pain 06/01/2018   Nonischemic cardiomyopathy (HCC) 05/15/2015   Nontraumatic incomplete tear of right rotator cuff 07/25/2019   Occipital headache 06/01/2018   Onychomycosis 05/25/2018   Osteoarthritis    Pain in right ankle and joints of right foot 10/05/2016   Palpitations 07/06/2017   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   Shoulder bursitis 10/12/2014   Injected in 10/12/2014  Repeat 01/21/2015   Small thenar eminence 02/03/2016   Spinal stenosis, lumbar region, without neurogenic claudication 10/12/2014   Spondylolisthesis at L5-S1 level 05/09/2021   Strain of latissimus dorsi muscle 01/21/2015   Tendinopathy of right biceps tendon 07/25/2019   Type 2 diabetes mellitus with hyperglycemia, without long-term current use of insulin (HCC) 12/22/2013   Ulnar neuropathy at elbow of right upper extremity 01/03/2019    Objective: BP 126/78   Pulse 80   Temp 97.8 F (36.6 C)   Ht 5\' 10"  (1.778 m)   Wt 207 lb 3.2 oz (94 kg)   SpO2 98%   BMI 29.73 kg/m  General: Awake, appears stated age Heart: RRR Lungs: CTAB, no rales, wheezes or rhonchi. No accessory muscle use Psych: Age appropriate judgment and insight, normal affect and mood  Assessment and Plan: Anemia due to vitamin B12 deficiency, unspecified B12 deficiency type - Plan: Intrinsic Factor Antibodies, cyanocobalamin (VITAMIN  B12) injection 1,000 mcg  Vitamin D insufficiency  Vitamin B12 injection given today.  Will check intrinsic factor antibodies.  He may just be able to supplement orally.  Follow-up pending the above results.  Encouraged him to follow with the cardiology team.  Stay hydrated.  He is being treated for vitamin D insufficiency. The patient voiced understanding and agreement to the plan.  I spent 31  minutes with the patient discussing the above plans in addition to reviewing his chart on the same day of the visit.  Jilda Roche [place], DO 07/09/23  12:22 PM

## 2023-07-12 ENCOUNTER — Encounter: Payer: Self-pay | Admitting: Family Medicine

## 2023-07-12 ENCOUNTER — Other Ambulatory Visit: Payer: Self-pay

## 2023-07-12 ENCOUNTER — Other Ambulatory Visit (HOSPITAL_COMMUNITY): Payer: Self-pay

## 2023-07-13 ENCOUNTER — Telehealth: Payer: Self-pay | Admitting: *Deleted

## 2023-07-13 ENCOUNTER — Ambulatory Visit

## 2023-07-13 ENCOUNTER — Other Ambulatory Visit: Payer: Self-pay

## 2023-07-13 DIAGNOSIS — I25110 Atherosclerotic heart disease of native coronary artery with unstable angina pectoris: Secondary | ICD-10-CM

## 2023-07-13 LAB — ECHOCARDIOGRAM COMPLETE: S' Lateral: 3.1 cm

## 2023-07-13 NOTE — Telephone Encounter (Signed)
 Spoke with patient , questions answered

## 2023-07-13 NOTE — Telephone Encounter (Signed)
 Patient is calling with further questions on how to take medication prior to procedure. Reached out to [employer], Charity fundraiser, who stated she will call pt back once she is finished speaking with another patient.

## 2023-07-13 NOTE — Telephone Encounter (Signed)
 Cardiac Catheterization scheduled at [facility] for: Wednesday July 14, 2023 11:30 AM Arrival time [facility] Main Entrance A at: 9:30 AM  Nothing to eat after midnight prior to procedure, clear liquids until 5 AM day of procedure.  Medication instructions: -Hold:  Metformin-day of procedure and 48 hours post procedure  Actos/Jardiance-AM of procedure  -Other usual morning medications can be taken with sips of water including aspirin 81 mg and Plavix 75 mg.  Plan to go home the same day, you will only stay overnight if medically necessary.  You must have responsible adult to drive you home.  Someone must be with you the first 24 hours after you arrive home.  Reviewed procedure instructions with patient.

## 2023-07-14 ENCOUNTER — Ambulatory Visit (HOSPITAL_COMMUNITY)
Admission: RE | Admit: 2023-07-14 | Discharge: 2023-07-14 | Disposition: A | Discharge status: Home / Self Care | Source: Ambulatory Visit | Attending: Cardiology | Admitting: Cardiology

## 2023-07-14 ENCOUNTER — Other Ambulatory Visit: Payer: Self-pay

## 2023-07-14 ENCOUNTER — Encounter (HOSPITAL_COMMUNITY)
Admission: RE | Disposition: A | Discharge status: Home / Self Care | Payer: Self-pay | Source: Ambulatory Visit | Attending: Cardiology

## 2023-07-14 DIAGNOSIS — Z7982 Long term (current) use of aspirin: Secondary | ICD-10-CM | POA: Insufficient documentation

## 2023-07-14 DIAGNOSIS — I10 Essential (primary) hypertension: Secondary | ICD-10-CM | POA: Insufficient documentation

## 2023-07-14 DIAGNOSIS — Z955 Presence of coronary angioplasty implant and graft: Secondary | ICD-10-CM | POA: Diagnosis not present

## 2023-07-14 DIAGNOSIS — Z7984 Long term (current) use of oral hypoglycemic drugs: Secondary | ICD-10-CM | POA: Insufficient documentation

## 2023-07-14 DIAGNOSIS — Z79899 Other long term (current) drug therapy: Secondary | ICD-10-CM | POA: Diagnosis not present

## 2023-07-14 DIAGNOSIS — I25118 Atherosclerotic heart disease of native coronary artery with other forms of angina pectoris: Secondary | ICD-10-CM | POA: Diagnosis not present

## 2023-07-14 DIAGNOSIS — E785 Hyperlipidemia, unspecified: Secondary | ICD-10-CM | POA: Insufficient documentation

## 2023-07-14 DIAGNOSIS — I2584 Coronary atherosclerosis due to calcified coronary lesion: Secondary | ICD-10-CM | POA: Diagnosis not present

## 2023-07-14 DIAGNOSIS — Z7902 Long term (current) use of antithrombotics/antiplatelets: Secondary | ICD-10-CM | POA: Diagnosis not present

## 2023-07-14 DIAGNOSIS — E119 Type 2 diabetes mellitus without complications: Secondary | ICD-10-CM | POA: Diagnosis not present

## 2023-07-14 DIAGNOSIS — I25110 Atherosclerotic heart disease of native coronary artery with unstable angina pectoris: Secondary | ICD-10-CM

## 2023-07-14 HISTORY — PX: LEFT HEART CATH AND CORONARY ANGIOGRAPHY: CATH118249

## 2023-07-14 LAB — GLUCOSE, CAPILLARY
Glucose-Capillary: 125 mg/dL — ABNORMAL HIGH (ref 70–99)
Glucose-Capillary: 105 mg/dL — ABNORMAL HIGH (ref 70–99)

## 2023-07-14 SURGERY — LEFT HEART CATH AND CORONARY ANGIOGRAPHY
Anesthesia: LOCAL

## 2023-07-14 MED ORDER — VERAPAMIL HCL 2.5 MG/ML IV SOLN
INTRAVENOUS | Status: AC
  Filled 2023-07-14: qty 2

## 2023-07-14 MED ORDER — MIDAZOLAM HCL 2 MG/2ML IJ SOLN
INTRAMUSCULAR | Status: AC
  Filled 2023-07-14: qty 2

## 2023-07-14 MED ORDER — LIDOCAINE HCL (PF) 1 % IJ SOLN
INTRAMUSCULAR | Status: DC | PRN
Start: 2023-07-14 — End: 2023-07-14
  Administered 2023-07-14: 5 mL

## 2023-07-14 MED ORDER — HEPARIN SODIUM (PORCINE) 1000 UNIT/ML IJ SOLN
INTRAMUSCULAR | Status: DC | PRN
  Administered 2023-07-14: 4500 [IU] via INTRAVENOUS

## 2023-07-14 MED ORDER — IOHEXOL 350 MG/ML SOLN
INTRAVENOUS | Status: DC | PRN
  Administered 2023-07-14: 25 mL via INTRA_ARTERIAL

## 2023-07-14 MED ORDER — SODIUM CHLORIDE 0.9 % IV SOLN
INTRAVENOUS | Status: DC | PRN
  Administered 2023-07-14: 200 mL via INTRAVENOUS

## 2023-07-14 MED ORDER — SODIUM CHLORIDE 0.9 % WEIGHT BASED INFUSION
3.0000 mL/kg/h | INTRAVENOUS | Status: AC
Start: 2023-07-14 — End: 2023-07-14

## 2023-07-14 MED ORDER — VERAPAMIL HCL 2.5 MG/ML IV SOLN
INTRAVENOUS | Status: DC | PRN
  Administered 2023-07-14: 10 mL via INTRA_ARTERIAL

## 2023-07-14 MED ORDER — MIDAZOLAM HCL 2 MG/2ML IJ SOLN
INTRAMUSCULAR | Status: DC | PRN
  Administered 2023-07-14: 1 mg via INTRAVENOUS

## 2023-07-14 MED ORDER — LIDOCAINE HCL (PF) 1 % IJ SOLN
INTRAMUSCULAR | Status: AC
  Filled 2023-07-14: qty 30

## 2023-07-14 MED ORDER — ASPIRIN 81 MG PO CHEW: 81.0000 mg | CHEWABLE_TABLET | ORAL | Status: DC

## 2023-07-14 MED ORDER — FENTANYL CITRATE (PF) 100 MCG/2ML IJ SOLN
INTRAMUSCULAR | Status: AC
  Filled 2023-07-14: qty 2

## 2023-07-14 MED ORDER — SODIUM CHLORIDE 0.9 % WEIGHT BASED INFUSION
1.0000 mL/kg/h | INTRAVENOUS | Status: DC
Start: 2023-07-14 — End: 2023-07-14

## 2023-07-14 MED ORDER — HEPARIN (PORCINE) IN NACL 1000-0.9 UT/500ML-% IV SOLN
INTRAVENOUS | Status: DC | PRN
  Administered 2023-07-14: 1000 mL via INTRA_ARTERIAL

## 2023-07-14 MED ORDER — HEPARIN SODIUM (PORCINE) 1000 UNIT/ML IJ SOLN
INTRAMUSCULAR | Status: AC
  Filled 2023-07-14: qty 10

## 2023-07-14 MED ORDER — FENTANYL CITRATE (PF) 100 MCG/2ML IJ SOLN
INTRAMUSCULAR | Status: DC | PRN
  Administered 2023-07-14: 25 ug via INTRAVENOUS

## 2023-07-14 SURGICAL SUPPLY — 7 items
CATH INFINITI AMBI 5FR TG (CATHETERS) IMPLANT
DEVICE RAD COMP TR BAND LRG (VASCULAR PRODUCTS) IMPLANT
GLIDESHEATH SLEND A-KIT 6F 22G (SHEATH) IMPLANT
GUIDEWIRE INQWIRE 1.5J.035X260 (WIRE) IMPLANT
INQWIRE 1.5J .035X260CM (WIRE) ×1 IMPLANT
PACK CARDIAC CATHETERIZATION (CUSTOM PROCEDURE TRAY) ×1 IMPLANT
SET ATX-X65L (MISCELLANEOUS) IMPLANT

## 2023-07-14 NOTE — Progress Notes (Signed)
 TR band removed at 1600, gauze dressing applied. Right radial level 0, clean, dry, and intact. Patient walked to the bathroom without difficulties.

## 2023-07-14 NOTE — H&P (Signed)
 OV 07/08/2023 copied for documentation   Cardiology Office Note:    Date:  07/14/2023   ID:  Reginald Rios, DOB [birth year], MRN [id number]  PCP:  Sharlene Dory, DO  Cardiologist:  Elder Negus, MD    Referring MD: No ref. provider found    ASSESSMENT:    CAD: History of cath and PCI of LCx in February 2019. Last stress echocardiogram from June 2023 good exercise capacity 10 METS and no ischemia.  Now with progressive symptoms over the past month and a half concerning for unstable angina.  Continue Plavix 75 mg once daily. Advised him to start taking aspirin 81 mg once daily Advised him to avoid any moderate to heavy exertion. Advised to call 911 or go to the ER for any acutely worsening symptoms.  He does have sublingual nitroglycerin to use as needed.  Will obtain transthoracic echocardiogram for cardiac structure and function assessment.  Shared Decision Making/Informed Consent{ The risks [stroke (1 in 1000), death (1 in 1000), kidney failure [usually temporary] (1 in 500), bleeding (1 in 200), allergic reaction [possibly serious] (1 in 200)], benefits (diagnostic support and management of coronary artery disease) and alternatives of a cardiac catheterization were discussed in detail with him and he is willing to proceed.   Will schedule for cardiac catheterization tentatively at [facility].  Will add low-dose amlodipine 2.5 mg once daily for antianginal effect.  Return to clinic tentatively in 3 to 4 weeks postprocedure.   Medication Adjustments/Labs and Tests Ordered: Current medicines are reviewed at length with the patient today.  Concerns regarding medicines are outlined above.  No orders of the defined types were placed in this encounter.  No orders of the defined types were placed in this encounter.    History of Present Illness:    Reginald Rios is a 68 y.o. male follows up with Dr. Tomie China and last visit with him in the  office was August 2024. His PCP 07/05/2023 with worsening exertional shortness of breath during physical activity.     With a hx of CAD with PCI and stent left circumflex coronary artery February 2019, type 2 diabetes, hyperlipidemia, sinus bradycardia, vertigo, chronic back pain.     Here for the visit by himself.  Mentions his functional status is baseline is limited due to back pain and vertigo.  For the past month and a half he has been noticing more shortness of breath with just walking his dog quarter of a mile which he routinely did before without any limitation.  Does not describe this as chest pain that feels he is out of breath and does not have any energy.  The symptoms remind him of symptoms back in 2019 prior to his cath and PCI.    Denies any orthopnea, paroxysmal nocturnal dyspnea, palpitations, syncope.  No blood in urine or stools.  Good compliance with his medications, takes Plavix consistently.  Has not been on.  But did not have any side effects to aspirin in the past while he took it.  Continues on rosuvastatin and Zetia for lipid lowering. Not on any beta-blockers due to baseline sinus bradycardia.  CBC showed a hemoglobin of 12.2 and hematocrit 36.5 stable in comparison to 11 months ago. CMP normal except for glucose of 290. BUN 20, creatinine 0.86, EGFR 87.6.  chest x-ray is performed and there is no results as of today.  Images reviewed by me today do not appear to show any major significant cardiopulmonary abnormality.  However we will wait for final read from radiologist.  He had a stress echo performed there is 10/02/2021 no findings of EKG or echo ischemia low risk study very good exercise capacity 10 METS and had a normal heart rate and blood pressure response achieving 102% of age-predicted maximum heart rate.  Recent EKG at PCPs office 07-05-2023 noted sinus heart rate 69/min, normal PR interval with normal QRS axis and QRS duration 93 ms.    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   BPPV (benign paroxysmal positional vertigo) 08/14/2014   CAD (coronary artery disease)    2/19 PCI/DESx1 to Lcx   Cervical disc disorder with radiculopathy of cervical region 10/07/2015   Cervical radicular pain 01/10/2019   Chronic cough 04/13/2019   Decreased cardiac ejection fraction 04/02/2015   Diabetes mellitus without complication (HCC)    Dyspnea on exertion 05/15/2015   Erectile dysfunction 07/11/2019   Exercise-induced asthma    Family history of early CAD 07/05/2017   Fatigue 05/15/2015   Hallux rigidus, right foot 09/15/2021   Heart murmur    Hemarthrosis of right elbow 02/03/2016   Hoarseness 04/13/2019   Hyperlipidemia    family hx of high cholesterol   Hypogonadism in male    Incomplete rotator cuff tear 09/10/2015   Injected 09/10/2015   Lateral epicondylitis of right elbow 07/28/2019   Low back pain at multiple sites 02/05/2021   Low vitamin D level 04/22/2017   Mild persistent asthma/cough variant asthma 04/13/2019   Myofascial pain syndrome, cervical 07/28/2019   Neck pain 06/01/2018   Nonischemic cardiomyopathy (HCC) 05/15/2015   Nontraumatic incomplete tear of right rotator cuff 07/25/2019   Occipital headache 06/01/2018   Onychomycosis 05/25/2018   Osteoarthritis    Pain in right ankle and joints of right foot 10/05/2016   Palpitations 07/06/2017   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   Shoulder bursitis 10/12/2014   Injected in 10/12/2014  Repeat 01/21/2015   Small thenar eminence 02/03/2016   Spinal stenosis, lumbar region, without  neurogenic claudication 10/12/2014   Spondylolisthesis at L5-S1 level 05/09/2021   Strain of latissimus dorsi muscle 01/21/2015   Tendinopathy of right biceps tendon 07/25/2019   Type 2 diabetes mellitus with hyperglycemia, without long-term current use of insulin (HCC) 12/22/2013   Ulnar neuropathy at elbow of right upper extremity 01/03/2019    Current Medications: Current Meds  Medication Sig   acetaminophen (TYLENOL) 500 MG tablet Take 1,000 mg by mouth every 6 (six) hours as needed for moderate pain (pain score 4-6).   albuterol (VENTOLIN HFA) 108 (90 Base) MCG/ACT inhaler Inhale 2 puffs by mouth every 4-6 hours as needed for coughing or wheezing spells   amLODipine (NORVASC) 2.5 MG tablet Take 1 tablet (2.5 mg total) by mouth daily.   azelastine (ASTELIN) 0.1 % nasal spray Place 2 sprays into both nostrils 2 (two) times daily as needed for rhinitis (nasal congestion). (Patient taking differently: Place 2 sprays into both nostrils daily.)   baclofen (LIORESAL) 10 MG tablet Take 1 tablet (10 mg total) by mouth 3 (three) times daily as needed for muscle spasms.   Calcium Carb-Cholecalciferol (CALCIUM +  D3 PO) Take 1 capsule by mouth daily.   clopidogrel (PLAVIX) 75 MG tablet Take 1 tablet (75 mg total) by mouth daily.   diclofenac Sodium (VOLTAREN) 1 % GEL Apply 2 g topically 4 (four) times daily. (Patient taking differently: Apply 2 g topically 4 (four) times daily as needed (pain).)   ezetimibe (ZETIA) 10 MG tablet Take 1 tablet (10 mg total) by mouth daily.   fluticasone (FLONASE) 50 MCG/ACT nasal spray Place 2 sprays into both nostrils daily.   Fluticasone Furoate (ARNUITY ELLIPTA) 50 MCG/ACT AEPB Inhale 1 puff into the lungs daily.   Glucagon (GVOKE HYPOPEN 1-PACK) 1 MG/0.2ML SOAJ Inject into the skin in the event of low sugar.   losartan (COZAAR) 25 MG tablet Take 1 tablet (25 mg total) by mouth daily.   meclizine (ANTIVERT) 25 MG tablet Take 1 tablet (25 mg total) by mouth 3 (three)  times daily as needed for dizziness.   metFORMIN (GLUCOPHAGE) 1000 MG tablet Take 1 tablet (1,000 mg total) by mouth 2 (two) times daily with a meal.   methylphenidate (RITALIN) 20 MG tablet Take 1 tablet (20 mg total) by mouth 2 (two) times daily.   montelukast (SINGULAIR) 10 MG tablet Take 1 tablet (10 mg total) by mouth daily.   Multiple Vitamin (MULTIVITAMIN) tablet Take 1 tablet by mouth daily.   nitroGLYCERIN (NITROSTAT) 0.4 MG SL tablet Place 0.4 mg under the tongue every 5 (five) minutes as needed for chest pain.   pantoprazole (PROTONIX) 40 MG tablet Take 1 tablet (40 mg total) by mouth daily.   pioglitazone (ACTOS) 45 MG tablet Take 1 tablet (45 mg total) by mouth daily.   rosuvastatin (CRESTOR) 5 MG tablet Take 1 tablet (5 mg total) by mouth daily.   sildenafil (VIAGRA) 100 MG tablet Take 1 tablet (100 mg total) by mouth daily as needed. Take 1 hour prior to sexual activity   testosterone (ANDROGEL) 50 MG/5GM (1%) GEL APPLY 2 APPLICATIONS ON TO THE SKIN DAILY      EKGs/Labs/Other Studies Reviewed:    The following studies were reviewed today:  Cardiac Studies & Procedures   ______________________________________________________________________________________________ CARDIAC CATHETERIZATION  CARDIAC CATHETERIZATION 06/23/2017  Narrative Images from the original result were not included.   CULPRIT LESION mid Cx to Dist Cx lesion is 80% stenosed.  A drug-eluting stent was successfully placed using a STENT SYNERGY DES 2.5X16. Postdilated and taper fashion from 3.1-2.6 mm  Post intervention, there is a 0% residual stenosis.  Prox LAD to Mid LAD lesion is 60% stenosed. --FFR 0.83. Not significant.  The ostial-proximal LAD and Circumflex have mild-moderate calcification without significant stenosis.  OM1 is a relatively small caliber vessel after gives off a branch and is not a good PCI target. No obvious lesions were noted to explain the FFR positive findings.  Successful  PCI to distal circumflex likely culprit lesion reducing 80% to 0% with a Synergy DES stent postdilated in tapered fashion. FFR not significant lesion of the LAD medical management -this correlates with CTA   Plan: Same day discharge after bed rest and TR band removal DAPT for minimum of 6 months, then okay to stop aspirin but continue Plavix for at least one year. Continue aggressive risk factor modification with lipid management.  Hold Janumet x 48 hr post Cath    Bryan Lemma, M.D., M.S. Interventional Cardiologist  Pager # [phone] Phone # [phone] [address] [place], [place] [address]  Findings Coronary Findings Diagnostic  Dominance: Left  Left Main  Vessel is large. The vessel exhibits minimal luminal irregularities.  Left Anterior Descending Ost LAD to Prox LAD lesion is 10% stenosed. The lesion is moderately calcified. Prox LAD to Mid LAD lesion is 60% stenosed. The lesion is moderately calcified. Pressure wire/FFR was performed on the lesion. FFR: 0.83. XB LAD 3.5 Guide - Prowater Wire -&gt; Micronavvus Catheter, initial FFR 0.92 --&gt; IV Adenosine 159mcg/kg/min x 2 min --&gt; Final FFR 0.83 (NOT PHYSIOLOGICALLY SIGNIFICANT- confirms CTA FFR).  First Septal Branch Vessel is small in size.  Left Circumflex Vessel is large. Ost Cx to Prox Cx lesion is 5% stenosed. The lesion is moderately calcified. Mid Cx to Dist Cx lesion is 80% stenosed. The lesion is tubular, eccentric and irregular.  First Obtuse Marginal Branch Vessel is moderate in size. Ost 1st Mrg lesion is 45% stenosed.  Lateral First Obtuse Marginal Branch Vessel is small in size.  Second Obtuse Marginal Branch Vessel is moderate in size. Ost 2nd Mrg lesion is 40% stenosed.  Left Posterior Descending Artery Vessel is moderate in size.  First Left Posterolateral Branch Vessel is small in size.  Right Coronary Artery Vessel is small. There is mild diffuse disease  throughout the vessel. The vessel is mildly calcified.  Acute Marginal Branch Vessel is small in size.  Intervention  Mid Cx to Dist Cx lesion Stent Lesion length:  14 mm. Lesion crossed with guidewire using a WIRE ASAHI PROWATER 180CM. Pre-stent angioplasty was performed using a BALLOON SAPPHIRE 2.25X10. Maximum pressure:  12 atm. Inflation time:  20 sec. A drug-eluting stent was successfully placed using a STENT SYNERGY DES 2.5X16. Maximum pressure: 14 atm. Inflation time: 30 sec. Minimum lumen area:  3.1 mm. Stent strut is well apposed. Tapered 3.1-2.6 mm Post-stent angioplasty was performed using a BALLOON SAPPHIRE [place] 3.0X12. Maximum pressure:  14 atm. Inflation time:  30 sec. Post-Intervention Lesion Assessment The intervention was successful. Pre-interventional TIMI flow is 3. Post-intervention TIMI flow is 3. Treated lesion length:  16 mm. No complications occurred at this lesion. There is a 0% residual stenosis post intervention.   STRESS TESTS  ECHOCARDIOGRAM STRESS TEST 10/08/2021  Narrative EXERCISE STRESS ECHO REPORT   --------------------------------------------------------------------------------  Patient Name:   Reginald Rios Date of Exam: 10/08/2021 Medical Rec #:  [id number]       Height:       70.0 in Accession #:    [id number]      Weight:       197.4 lb Date of Birth:  1956-03-28       BSA:          2.076 m Patient Age:    66 years        BP:           154/78 mmHg Patient Gender: M               HR:           70 bpm. Exam Location:  [address]  Procedure: 2D Echo, Stress Echo and Intracardiac Opacification Agent  Indications:    I25.10 Coronary artery disease  History:        Patient has prior history of Echocardiogram examinations, most recent 05/05/2017.  Sonographer:    Daphine Deutscher RDCS Referring Phys: [id number] BRIAN J MUNLEY  IMPRESSIONS   1. Excellent exercise capacity (8:00 min:s; 10 METS). Normal HR/BP response to exercise. 2. This is  a negative stress echocardiogram for ischemia. 3. This is a low risk study.  FINDINGS  Exam Protocol:  The patient exercised on a treadmill according to a Bruce protocol. Definity contrast agent was given IV to delineate the left ventricular endocardial borders.   Patient Performance: The patient exercised for 8 minutes and 0 seconds, achieving 10 METS. The maximum stage achieved was III of the Bruce protocol. The baseline heart rate was 80 bpm. The heart rate at peak stress was 155 bpm. The target heart rate was calculated to be 129 bpm. The percentage of maximum predicted heart rate achieved was 102.2 %. The baseline blood pressure was 154/78 mmHg. The blood pressure at peak stress was 194/69 mmHg. The blood pressure response was normal. The patient developed fatigue during the stress exam. The patient's functional capacity was excellent.  EKG: Resting EKG showed normal sinus rhythm with no abnormal findings. The patient developed no abnormal EKG findings during exercise.   2D Echo Findings: The baseline ejection fraction was 60%. The peak ejection fraction at stress was 80%. Baseline regional wall motion abnormalities were not present. There were no stress-induced wall motion abnormalities. This is a negative stress echocardiogram for ischemia.   Lennie Odor MD Electronically signed on 10/08/2021 at 3:40:21 PM     Final   ECHOCARDIOGRAM  ECHOCARDIOGRAM COMPLETE 07/13/2023  Narrative ECHOCARDIOGRAM REPORT    Patient Name:   Reginald Rios Date of Exam: 07/13/2023 Medical Rec #:  [id number]         Height:       70.0 in Accession #:    [id number]        Weight:       207.2 lb Date of Birth:  [birth year]         BSA:          2.119 m Patient Age:    68 years          BP:           126/78 mmHg Patient Gender: M                 HR:           69 bpm. Exam Location:  [place]  Procedure: 2D Echo, Cardiac Doppler, Color Doppler and Strain Analysis (Both Spectral and Color Flow  Doppler were utilized during procedure).  Indications:    Dyspnea R06.00  History:        Patient has prior history of Echocardiogram examinations, most recent 05/05/2017. CAD; Risk Factors:Diabetes mellitus without complication.  Sonographer:    Louie Boston RDCS Referring Phys: [id number] Marlyn Corporal MADIREDDY  IMPRESSIONS   1. Left ventricular ejection fraction, by estimation, is 60 to 65%. The left ventricle has normal function. The left ventricle has no regional wall motion abnormalities. There is mild concentric left ventricular hypertrophy. Left ventricular diastolic parameters were normal. 2. Right ventricular systolic function is normal. The right ventricular size is normal. There is normal pulmonary artery systolic pressure. 3. The mitral valve is normal in structure. No evidence of mitral valve regurgitation. No evidence of mitral stenosis. 4. The aortic valve is tricuspid. Aortic valve regurgitation is not visualized. Aortic valve sclerosis is present, with no evidence of aortic valve stenosis. 5. Aortic Normal DTA. 6. The inferior vena cava is normal in size with greater than 50% respiratory variability, suggesting right atrial pressure of 3 mmHg.  FINDINGS Left Ventricle: Left ventricular ejection fraction, by estimation, is 60 to 65%. The left ventricle has normal function. The left ventricle has no regional wall motion abnormalities. Global longitudinal strain performed but not reported based on  interpreter judgement due to suboptimal tracking. The left ventricular internal cavity size was normal in size. There is mild concentric left ventricular hypertrophy. Left ventricular diastolic parameters were normal. Normal left ventricular filling pressure.  Right Ventricle: The right ventricular size is normal. No increase in right ventricular wall thickness. Right ventricular systolic function is normal. There is normal pulmonary artery systolic pressure. The tricuspid regurgitant  velocity is 2.35 m/s, and with an assumed right atrial pressure of 3 mmHg, the estimated right ventricular systolic pressure is 25.1 mmHg.  Left Atrium: Left atrial size was normal in size.  Right Atrium: Right atrial size was normal in size.  Pericardium: There is no evidence of pericardial effusion.  Mitral Valve: The mitral valve is normal in structure. No evidence of mitral valve regurgitation. No evidence of mitral valve stenosis.  Tricuspid Valve: The tricuspid valve is normal in structure. Tricuspid valve regurgitation is mild . No evidence of tricuspid stenosis.  Aortic Valve: The aortic valve is tricuspid. Aortic valve regurgitation is not visualized. Aortic valve sclerosis is present, with no evidence of aortic valve stenosis.  Pulmonic Valve: The pulmonic valve was normal in structure. Pulmonic valve regurgitation is not visualized. No evidence of pulmonic stenosis.  Aorta: Normal DTA, the aortic arch was not well visualized and the aortic root and ascending aorta are structurally normal, with no evidence of dilitation.  Venous: The pulmonary veins were not well visualized. The inferior vena cava is normal in size with greater than 50% respiratory variability, suggesting right atrial pressure of 3 mmHg.  IAS/Shunts: No atrial level shunt detected by color flow Doppler.   LEFT VENTRICLE PLAX 2D LVIDd:         4.30 cm   Diastology LVIDs:         3.10 cm   LV e' medial:    7.29 cm/s LV PW:         1.10 cm   LV E/e' medial:  12.1 LV IVS:        1.20 cm   LV e' lateral:   13.40 cm/s LVOT diam:     2.00 cm   LV E/e' lateral: 6.6 LV SV:         75 LV SV Index:   35 LVOT Area:     3.14 cm   RIGHT VENTRICLE             IVC RV Basal diam:  2.90 cm     IVC diam: 1.70 cm RV S prime:     10.60 cm/s TAPSE (M-mode): 2.2 cm  LEFT ATRIUM             Index        RIGHT ATRIUM          Index LA diam:        3.70 cm 1.75 cm/m   RA Area:     9.74 cm LA Vol (A2C):   60.8 ml 28.69  ml/m  RA Volume:   19.60 ml 9.25 ml/m LA Vol (A4C):   46.9 ml 22.13 ml/m LA Biplane Vol: 54.9 ml 25.91 ml/m AORTIC VALVE LVOT Vmax:   120.00 cm/s LVOT Vmean:  76.800 cm/s LVOT VTI:    0.238 m  AORTA Ao Root diam: 3.00 cm Ao Asc diam:  3.40 cm Ao Desc diam: 2.00 cm  MV E velocity: 88.05 cm/s  TRICUSPID VALVE MV A velocity: 85.40 cm/s  TR Peak grad:   22.1 mmHg MV E/A ratio:  1.03  TR Vmax:        235.00 cm/s  SHUNTS Systemic VTI:  0.24 m Systemic Diam: 2.00 cm  Norman Herrlich MD Electronically signed by Norman Herrlich MD Signature Date/Time: 07/13/2023/5:54:59 PM    Final      CT SCANS  CT CORONARY FRACTIONAL FLOW RESERVE DATA PREP 06/08/2017  Narrative EXAM: FF/RCT ANALYSIS  FINDINGS: FFRct analysis was performed on the original cardiac CT angiogram dataset. Diagrammatic representation of the FFRct analysis is provided in a separate PDF document in PACS. This dictation was created using the PDF document and an interactive 3D model of the results. 3D model is not available in the EMR/PACS. Normal FFR range is >0.80.  1. Left Main:  No significant stenosis.  2. LAD: No significant stenosis. 3. D1: No significant stenosis. 4. LCX: Proximal: 0.89, mid LCX: 0.85, distal LCX: 0.80. 5. OM1: Proximal: 0.83, distal: 0.77. 6. RCA: Small non-dominant.  IMPRESSION: 1. CT FFR analysis showed significant stenoses in the mid LCX and proximal OM1 arteries. A cardiac catheterization is recommended.   Electronically Signed By: Tobias Alexander On: 06/08/2017 17:58   CT CORONARY MORPH W/CTA COR W/SCORE 06/08/2017  Addendum 06/08/2017  5:57 PM ADDENDUM REPORT: 06/08/2017 17:54  CLINICAL DATA:  54 -year-old male with fatigue and exertional dyspnea.  EXAM: Cardiac/Coronary  CT  TECHNIQUE: The patient was scanned on a [employer].  FINDINGS: A 120 kV prospective scan was triggered in the descending thoracic aorta at 111 HU's. Axial non-contrast 3 mm  slices were carried out through the heart. The data set was analyzed on a dedicated work station and scored using the Agatson method. Gantry rotation speed was 250 msecs and collimation was .6 mm. No beta blockade and 0.8 mg of sl NTG was given. The 3D data set was reconstructed in 5% intervals of the 67-82 % of the R-R cycle. Diastolic phases were analyzed on a dedicated work station using MPR, MIP and VRT modes. The patient received 80 cc of contrast.  Aorta:  Normal size.  No calcifications.  No dissection.  Aortic Valve:  Trileaflet.  No calcifications.  Coronary Arteries:  Normal coronary origin.  Left dominance.  Left main is a large artery that gives rise to LAD and LCX arteries. LM has no plaque.  LAD is a large vessel that gives rise to two diagonal arteries. Proximal LADS has moderate diffuse almost circumferential plaque with stenosis 25-50% and a focal stenosis of 50-69% at the takeoff of second diagonal artery. Mid LAD has a moderate diffuse predominantly calcified plaque that is almost circumferential with stenosis 50-69%.  D1 is very small with no obvious plaque.  D2 has a moderate ostial calcified lesion with focal stenosis 50-69%.  LCX is a large dominant artery that gives rise to two OM branches, PDA and PLA arteries. Proximal LCX artery has mild calcified plaque with associated stenosis 25-50%. Mid LCX artery has a moderate mixed, predominantly calcified almost circumferential plaque with associated stenosis 50-69% and possibly focal > 70% stenosis.  OM1 is a very small artery with mild plaque.  OM2 is a medium size artery with moderate ostial proximal calcified plaque with associated stenosis 50-69%.  PDA is small with limited visualization, no obvious plaque is seen.  PLA is very small and poorly visualized.  RCA is a small non-dominant artery that has mild calcified plaque in the proximal portion with associated stenosis 25-50%.  Other  findings:  Normal pulmonary vein drainage into the left atrium.  Normal let atrial  appendage without a thrombus.  Dilated pulmonary artery measuring 32 mm.  IMPRESSION: 1. Coronary calcium score of 1715. This was 43 percentile for age and sex matched control.  2. Normal coronary origin with left dominance.  3. Moderate diffuse plaque in the proximal and mid LAD, proximal second diagonal artery, mid LCX and proximal OM2 branch. There are severe diffuse calcifications that are affecting interpretation. Additional analysis with CT FFR will be performed.  4. Dilated pulmonary artery measuring 32 mm suspicious for pulmonary hypertension.   Electronically Signed By: Tobias Alexander On: 06/08/2017 17:54  Narrative EXAM: OVER-READ INTERPRETATION  CT CHEST  The following report is an over-read performed by radiologist Dr. Charlett Nose of [facility] on 06/08/2017. This over-read does not include interpretation of cardiac or coronary anatomy or pathology. The coronary CTA interpretation by the cardiologist is attached.  COMPARISON:  None.  FINDINGS: Vascular: Aorta is normal caliber.  Heart is normal size.  Mediastinum/Nodes: No adenopathy in the lower mediastinum or hila.  Lungs/Pleura: Visualized lungs clear.  No effusions.  Upper Abdomen: Suspect fatty infiltration of the liver.  Musculoskeletal: Chest wall soft tissues are unremarkable. No acute bony abnormality.  IMPRESSION: No acute extra cardiac abnormality.  Fatty infiltration of the liver.  Electronically Signed: By: Charlett Nose M.D. On: 06/08/2017 09:55     ______________________________________________________________________________________________          Recent Labs: 07/05/2023: ALT 25; BUN 20; Creatinine, Ser 0.86; Hemoglobin 12.2; Platelets 198.0; Potassium 4.2; Sodium 137; TSH 2.63  Recent Lipid Panel    Component Value Date/Time   CHOL 114 04/13/2023 0900   CHOL 156  04/22/2022 1439   TRIG 67.0 04/13/2023 0900   HDL 45.00 04/13/2023 0900   HDL 53 04/22/2022 1439   CHOLHDL 3 04/13/2023 0900   VLDL 13.4 04/13/2023 0900   LDLCALC 55 04/13/2023 0900   LDLCALC 89 04/22/2022 1439    Physical Exam:    VS:  There were no vitals taken for this visit.    Wt Readings from Last 3 Encounters:  07/09/23 94 kg  07/08/23 93.4 kg  07/08/23 94 kg    General: alert, awake, oriented to Patient oriented to: self, place, time, and situation Neck: No Jugular venous distension. No carotid bruit. Heart: Exam; heart brief: Regular rate and rhythm, S1S2 present, or without murmur or extra heart sounds Extremities: pedal edema: none.  Pulses: bilateral radial pulses normal pedal pulses normal both DP's and PT's   Signed, Elder Negus, MD  07/14/2023 8:33 AM    [facility]

## 2023-07-14 NOTE — Discharge Instructions (Signed)

## 2023-07-14 NOTE — Interval H&P Note (Signed)
 History and Physical Interval Note:  07/14/2023 1:48 PM  Reginald Rios  has presented today for surgery, with the diagnosis of Chest pain.  The various methods of treatment have been discussed with the patient and family. After consideration of risks, benefits and other options for treatment, the patient has consented to  Procedure(s): LEFT HEART CATH AND CORONARY ANGIOGRAPHY (N/A) as a surgical intervention.  The patient's history has been reviewed, patient examined, no change in status, stable for surgery.  I have reviewed the patient's chart and labs.  Questions were answered to the patient's satisfaction.     Ronal Maybury J [name]

## 2023-07-15 ENCOUNTER — Encounter (HOSPITAL_COMMUNITY): Payer: Self-pay | Admitting: Cardiology

## 2023-07-16 ENCOUNTER — Encounter: Payer: Self-pay | Admitting: Internal Medicine

## 2023-07-16 LAB — SULFONYLUREA HYPOGLYCEMICS PANEL, SERUM
Chlorpropamide: NOT DETECTED ug/mL
Glimepiride: NOT DETECTED ng/mL
Glipizide: NOT DETECTED ng/mL
Glyburide: NOT DETECTED ng/mL
Nateglinide: NOT DETECTED ug/mL
Pioglitazone: 720 ng/mL
Repaglinide: NOT DETECTED ng/mL
Rosiglitazone: NOT DETECTED ng/mL
Tolazamide: NOT DETECTED ug/mL
Tolbutamide: NOT DETECTED ug/mL

## 2023-07-16 LAB — BETA-HYDROXYBUTYRATE: Beta-Hydroxybutyric Acid: 0.16 mmol/L

## 2023-07-16 LAB — BASIC METABOLIC PANEL
BUN: 17 mg/dL (ref 7–25)
CO2: 28 mmol/L (ref 20–32)
Calcium: 9.2 mg/dL (ref 8.6–10.3)
Chloride: 104 mmol/L (ref 98–110)
Creat: 0.82 mg/dL (ref 0.70–1.28)
Glucose, Bld: 160 mg/dL — ABNORMAL HIGH (ref 65–99)
Potassium: 4 mmol/L (ref 3.5–5.3)
Sodium: 139 mmol/L (ref 135–146)

## 2023-07-16 LAB — INSULIN, RANDOM: Insulin: 7.3 u[IU]/mL

## 2023-07-16 LAB — GLUCAGON: Glucagon Lvl: 27 pg/mL (ref 11–78)

## 2023-07-16 LAB — C-PEPTIDE: C-Peptide: 2.06 ng/mL (ref 0.80–3.85)

## 2023-07-16 LAB — PROINSULIN: Proinsulin: 16.8 pmol/L (ref <=18.8)

## 2023-07-16 LAB — INSULIN-LIKE GROWTH FACTOR
IGF-I, LC/MS: 218 ng/mL (ref 34–245)
Z-Score (Male): 1.5 {STDV} (ref ?–2.0)

## 2023-07-16 LAB — INSULIN ANTIBODIES, BLOOD: Insulin Antibodies, Human: <0.4 U/mL (ref <0.4)

## 2023-07-16 LAB — CORTISOL: Cortisol, Plasma: 5.9 ug/dL

## 2023-07-16 NOTE — Telephone Encounter (Signed)
 We haven't seen him since July 2024.  He needs an appointment

## 2023-07-19 ENCOUNTER — Other Ambulatory Visit (HOSPITAL_BASED_OUTPATIENT_CLINIC_OR_DEPARTMENT_OTHER): Payer: Self-pay

## 2023-07-19 ENCOUNTER — Ambulatory Visit (INDEPENDENT_AMBULATORY_CARE_PROVIDER_SITE_OTHER): Admitting: Family

## 2023-07-19 ENCOUNTER — Encounter: Payer: Self-pay | Admitting: Family

## 2023-07-19 VITALS — BP 114/58 | HR 94 | Temp 97.2°F | Resp 18 | Ht 70.0 in | Wt 201.2 lb

## 2023-07-19 DIAGNOSIS — J45990 Exercise induced bronchospasm: Secondary | ICD-10-CM | POA: Diagnosis not present

## 2023-07-19 DIAGNOSIS — J3089 Other allergic rhinitis: Secondary | ICD-10-CM

## 2023-07-19 DIAGNOSIS — K219 Gastro-esophageal reflux disease without esophagitis: Secondary | ICD-10-CM

## 2023-07-19 DIAGNOSIS — J4530 Mild persistent asthma, uncomplicated: Secondary | ICD-10-CM | POA: Diagnosis not present

## 2023-07-19 MED ORDER — ALBUTEROL SULFATE HFA 108 (90 BASE) MCG/ACT IN AERS
2.0000 | INHALATION_SPRAY | RESPIRATORY_TRACT | 1 refills | Status: DC
  Filled 2023-07-19: qty 6.7, 16d supply, fill #0

## 2023-07-19 MED ORDER — FLUTICASONE PROPIONATE HFA 110 MCG/ACT IN AERO
2.0000 | INHALATION_SPRAY | Freq: Two times a day (BID) | RESPIRATORY_TRACT | 5 refills | Status: DC
  Filled 2023-07-19: qty 12, 30d supply, fill #0
  Filled 2023-08-27: qty 12, 30d supply, fill #1

## 2023-07-19 NOTE — Progress Notes (Signed)
 [address] [place] [address] Dept: [phone]  FOLLOW UP NOTE  Patient ID: Reginald Rios, male    DOB: 01-31-1956  Age: 68 y.o. MRN: [id number] Date of Office Visit: 07/19/2023  Assessment  Chief Complaint: Follow-up (Pt states he's been SOB during a walk, he also haven't been taking it as constantly because he was afraid of thrush.), Asthma, and Shortness of Breath  HPI MAHONRI Rios is a 68 year old male who presents today for acute visit of shortness of breath with exertion, tightness in chest, and tickle in chest.  He was last seen on November 24, 2022 by Dr. Marlynn Perking for chronic cough: Intermittent asthma, allergic rhinitis with a nonallergic irritant component, and acid reflux.  He reports that on the 11th or 12th of this month he had a left heart catheterization that did not require any intervention.  He also reports that his recent echocardiogram on July 13, 2023 echocardiogram  that he reports was normal.  Chronic cough: Intermittent asthma: He reports for the past 6 months he has had shortness of breath with exertion that occurs when he is three fourths of a mile to 1 mile into his walk.  He also will have tightness in his chest,tickle in his chest, and coughing if he is around certain smells such as smoke or if it is really dry outside. He also feels like "cotton is in his chest." He denies wheezing and nocturnal awakenings due to breathing problems.  Since his last office visit he has not made any trips to the emergency room or urgent care due to breathing problems.  He has received possible steroid injections for discs in his back that he thinks may require surgery.  He is using albuterol once or twice a week and reports that it helps.  He has not tried using his albuterol before exercising because he thought it was something else causing his symptoms.  He has only been using Flovent 110 mcg as needed.  He never used Flovent consistently.He did do 2 puffs of Flovent this  morning and prior to the that the last time he used it was one time last week.  He had concerns of getting thrush.  He mentions that he has always had exercise-induced asthma, but feels like it has been worse in the past 6 months. He denies chest pain or palpitations.He had a chest x-ray on 07/05/23,but the chest x-ray has not been interpreted.  Allergic rhinitis with a non allergic (irritant) component: He reports rhinorrhea some mornings that is mostly clear in color.  He also will have nasal congestion in the morning.  He denies postnasal drip.  He has not been treated for any sinus infections since we last saw him.  He reports that his voice is lower in volume off-and-on for the past year.  He denies hoarseness.  He has never seen ENT.  He is using Flonase nasal spray and azelastine nasal spray twice a day.  Acid reflux has been great.  He feels like the pantoprazole 40 mg nightly has been working great.  He denies heartburn or reflux symptoms.   Drug Allergies:  Allergies  Allergen Reactions   Invokana [Canagliflozin] Rash   Grass Pollen(K-O-R-T-Swt Vern)    Lisinopril Cough   Semaglutide Nausea Only    Causes cramping. Patient is intolerant to Reginald Rios    Review of Systems: Negative except as per HPI   Physical Exam: BP (!) 114/58   Pulse 94   Temp (!)  97.2 F (36.2 C) (Temporal)   Resp 18   Ht 5\' 10"  (1.778 m)   Wt 201 lb 3.2 oz (91.3 kg)   SpO2 98%   BMI 28.87 kg/m    Physical Exam Exam conducted with a chaperone present.  Constitutional:      Appearance: Normal appearance.  HENT:     Head: Normocephalic and atraumatic.     Comments: Pharynx normal, eyes normal, ears normal, nose normal    Right Ear: Tympanic membrane, ear canal and external ear normal.     Left Ear: Tympanic membrane, ear canal and external ear normal.     Nose: Nose normal.     Mouth/Throat:     Mouth: Mucous membranes are moist.     Pharynx: Oropharynx is clear.  Eyes:     Conjunctiva/sclera:  Conjunctivae normal.  Cardiovascular:     Rate and Rhythm: Regular rhythm.     Heart sounds: Normal heart sounds.  Pulmonary:     Effort: Pulmonary effort is normal.     Breath sounds: Normal breath sounds.     Comments: Lungs clear to auscultation Musculoskeletal:     Cervical back: Neck supple.  Skin:    General: Skin is warm.     Comments: No rashes or urticarial lesions noted  Neurological:     Mental Status: He is alert and oriented to person, place, and time.  Psychiatric:        Mood and Affect: Mood normal.        Behavior: Behavior normal.        Thought Content: Thought content normal.        Judgment: Judgment normal.     Diagnostics: FVC 2.98 L (70%), FEV1 2.64 L (81%), FEV1/FVC 0.87.  Spirometry indicates possible mild restriction.  4 puffs of Xopenex given.  He reports that his breathing feels better after the 4 puffs of Xopenex.  Postbronchodilator response shows FVC 2.81 L ( 66%), FEV1 2.52 L (79%), FEV1/FVC 0.90. Spirometry indicates mild restriction with no change in FEV1.  Assessment and Plan: 1. Not well controlled mild persistent asthma   2. Allergic rhinitis with a possible nonallergic component   3. Gastroesophageal reflux disease, unspecified whether esophagitis present   4. Exercise-induced asthma     Meds ordered this encounter  Medications   fluticasone (FLOVENT HFA) 110 MCG/ACT inhaler    Sig: Inhale 2 puffs into the lungs 2 (two) times daily. Use with spacer. Rinse, gargle, and spit after use.    Dispense:  12 g    Refill:  5   albuterol (VENTOLIN HFA) 108 (90 Base) MCG/ACT inhaler    Sig: Inhale 2 puffs by mouth every 4-6 hours as needed for coughing or wheezing spells    Dispense:  6.7 g    Refill:  1    Patient Instructions  Chronic Cough: intermittent asthma,  Continue albuterol HFA, 2 to 4 inhalations every 4-6 hours if needed.  May use albuterol 2 puffs 5 to 15 minutes before strenuous physical activity Start Flovent 2 puff  twice a day with spacer. Rinse mouth out after Use before you brush your teeth, this counts as washing your mouth out  Asthma control goals:  Full participation in all desired activities (may need albuterol before activity) Albuterol use two time or less a week on average (not counting use with activity) Cough interfering with sleep two time or less a month Oral steroids no more than once a year No hospitalizations  Allergic rhinitis with a nonallergic (irritant) component At your previous visit, Perennial and seasonal epicutaneous tests were negative despite a positive histamine control.  Intradermal tests were mildly reactive to grass pollen, major mold mix #4, and cockroach antigen. Continue allergen avoidance to the above as well as to irritants (perfumes, strong smells, chemical cleaners, etc) Continue azelastine nasal spray, 1 spray per nostril twice daily as needed for congestion.  Nasal saline spray (i.e., Simply Saline, Ocean Mist) or nasal saline lavage (i.e., NeilMed-with distilled water only) is recommended as needed and prior to medicated nasal sprays. We will refer to ENT due to off/on lower voice for the past year   Acid reflux Appropriate reflux lifestyle modifications have been provided. Continue Pantoprazole (protonix) 40 mg nightly.  May take Tums as needed.  Verbally instructed that if symptoms worsen or persist to go to the Emergency Room Follow up: 6 weeks or sooner if needed    Return in about 6 weeks (around 08/30/2023), or if symptoms worsen or fail to improve.    Thank you for the opportunity to care for this patient.  Please do not hesitate to contact me with questions.  Nehemiah Settle, FNP [facility]

## 2023-07-19 NOTE — Patient Instructions (Addendum)
 Chronic Cough: intermittent asthma,  Continue albuterol HFA, 2 to 4 inhalations every 4-6 hours if needed.  May use albuterol 2 puffs 5 to 15 minutes before strenuous physical activity Start Flovent 2 puff twice a day with spacer. Rinse mouth out after Use before you brush your teeth, this counts as washing your mouth out  Asthma control goals:  Full participation in all desired activities (may need albuterol before activity) Albuterol use two time or less a week on average (not counting use with activity) Cough interfering with sleep two time or less a month Oral steroids no more than once a year No hospitalizations    Allergic rhinitis with a nonallergic (irritant) component At your previous visit, Perennial and seasonal epicutaneous tests were negative despite a positive histamine control.  Intradermal tests were mildly reactive to grass pollen, major mold mix #4, and cockroach antigen. Continue allergen avoidance to the above as well as to irritants (perfumes, strong smells, chemical cleaners, etc) Continue azelastine nasal spray, 1 spray per nostril twice daily as needed for congestion.  Nasal saline spray (i.e., Simply Saline, Ocean Mist) or nasal saline lavage (i.e., NeilMed-with distilled water only) is recommended as needed and prior to medicated nasal sprays. We will refer to ENT due to off/on lower voice for the past year   Acid reflux Appropriate reflux lifestyle modifications have been provided. Continue Pantoprazole (protonix) 40 mg nightly.  May take Tums as needed.  Verbally instructed that if symptoms worsen or persist to go to the Emergency Room Follow up: 6 weeks or sooner if needed

## 2023-07-26 ENCOUNTER — Encounter: Payer: Self-pay | Admitting: Family

## 2023-07-28 ENCOUNTER — Other Ambulatory Visit: Payer: Self-pay

## 2023-07-28 ENCOUNTER — Other Ambulatory Visit (HOSPITAL_BASED_OUTPATIENT_CLINIC_OR_DEPARTMENT_OTHER): Payer: Self-pay

## 2023-07-28 ENCOUNTER — Other Ambulatory Visit: Payer: Self-pay | Admitting: Family Medicine

## 2023-07-28 MED ORDER — METHYLPHENIDATE HCL 20 MG PO TABS: 20.0000 mg | ORAL_TABLET | Freq: Two times a day (BID) | ORAL | 0 refills | Status: DC

## 2023-07-28 MED ORDER — METHYLPHENIDATE HCL 20 MG PO TABS
20.0000 mg | ORAL_TABLET | Freq: Two times a day (BID) | ORAL | 0 refills | Status: DC
  Filled 2023-07-28 – 2023-07-29 (×2): qty 60, 30d supply, fill #0

## 2023-07-28 MED ORDER — METHYLPHENIDATE HCL 20 MG PO TABS
20.0000 mg | ORAL_TABLET | Freq: Two times a day (BID) | ORAL | 0 refills | Status: DC
  Filled 2023-09-29: qty 60, 30d supply, fill #0

## 2023-07-28 NOTE — Telephone Encounter (Signed)
 Requesting: Ritalin 20mg   Contract: 05/01/20 UDS: 01/10/2019 Last Visit: 07/09/23 Next Visit: 08/09/23 Last Refill: see med list   Please Advise

## 2023-07-29 ENCOUNTER — Other Ambulatory Visit (HOSPITAL_BASED_OUTPATIENT_CLINIC_OR_DEPARTMENT_OTHER): Payer: Self-pay

## 2023-08-04 ENCOUNTER — Encounter: Payer: Self-pay | Admitting: Family Medicine

## 2023-08-05 ENCOUNTER — Other Ambulatory Visit (HOSPITAL_BASED_OUTPATIENT_CLINIC_OR_DEPARTMENT_OTHER): Payer: Self-pay

## 2023-08-09 ENCOUNTER — Other Ambulatory Visit (HOSPITAL_BASED_OUTPATIENT_CLINIC_OR_DEPARTMENT_OTHER): Payer: Self-pay

## 2023-08-09 ENCOUNTER — Ambulatory Visit: Payer: Commercial Managed Care - PPO | Admitting: Family Medicine

## 2023-08-09 ENCOUNTER — Encounter: Payer: Self-pay | Admitting: Family Medicine

## 2023-08-09 VITALS — BP 128/68 | HR 86 | Ht 70.0 in | Wt 200.0 lb

## 2023-08-09 DIAGNOSIS — E291 Testicular hypofunction: Secondary | ICD-10-CM

## 2023-08-09 DIAGNOSIS — Z79899 Other long term (current) drug therapy: Secondary | ICD-10-CM | POA: Diagnosis not present

## 2023-08-09 DIAGNOSIS — E1165 Type 2 diabetes mellitus with hyperglycemia: Secondary | ICD-10-CM

## 2023-08-09 DIAGNOSIS — F988 Other specified behavioral and emotional disorders with onset usually occurring in childhood and adolescence: Secondary | ICD-10-CM

## 2023-08-09 DIAGNOSIS — E538 Deficiency of other specified B group vitamins: Secondary | ICD-10-CM | POA: Diagnosis not present

## 2023-08-09 DIAGNOSIS — R490 Dysphonia: Secondary | ICD-10-CM

## 2023-08-09 MED ORDER — CYANOCOBALAMIN 1000 MCG/ML IJ SOLN
1000.0000 ug | Freq: Once | INTRAMUSCULAR | Status: AC
Start: 2023-08-09 — End: 2023-08-09
  Administered 2023-08-09: 1000 ug via INTRAMUSCULAR

## 2023-08-09 NOTE — Patient Instructions (Addendum)
Give us 2-3 business days to get the results of your labs back.   If you do not hear anything about your referral in the next 1-2 weeks, call our office and ask for an update.  Let us know if you need anything. 

## 2023-08-09 NOTE — Progress Notes (Signed)
 Chief Complaint  Patient presents with   Medicare Wellness    Patient presents today for a 3 month follow-up    Reginald Rios is 68 y.o. male here for ADHD follow up.  Patient is currently on Ritalin 20 mg bid and compliance is excellent. Symptoms are well controlled. . Side effects include none. Patient believes their dose should be not significantly changed. Denies tics, weight loss, difficulties with sleep, self-medication, alcohol/drug abuse, chest pain, or palpitations.  Low T Taking the Androgel 2 pumps daily. Reports compliance, no AE's.   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   BPPV (benign paroxysmal positional vertigo) 08/14/2014   CAD (coronary artery disease)    2/19 PCI/DESx1 to Lcx   Cervical disc disorder with radiculopathy of cervical region 10/07/2015   Cervical radicular pain 01/10/2019   Chronic cough 04/13/2019   Decreased cardiac ejection fraction 04/02/2015   Diabetes mellitus without complication (HCC)    Dyspnea on exertion 05/15/2015   Erectile dysfunction 07/11/2019   Exercise-induced asthma    Family history of early CAD 07/05/2017   Fatigue 05/15/2015   Hallux rigidus, right foot 09/15/2021   Heart murmur    Hemarthrosis of right elbow 02/03/2016   Hoarseness 04/13/2019   Hyperlipidemia    family hx of high cholesterol   Hypogonadism in male    Incomplete rotator cuff tear 09/10/2015   Injected 09/10/2015   Lateral epicondylitis of right elbow 07/28/2019   Low back pain at multiple sites 02/05/2021   Low vitamin D level 04/22/2017   Mild  persistent asthma/cough variant asthma 04/13/2019   Myofascial pain syndrome, cervical 07/28/2019   Neck pain 06/01/2018   Nonischemic cardiomyopathy (HCC) 05/15/2015   Nontraumatic incomplete tear of right rotator cuff 07/25/2019   Occipital headache 06/01/2018   Onychomycosis 05/25/2018   Osteoarthritis    Pain in right ankle and joints of right foot 10/05/2016   Palpitations 07/06/2017   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   Shoulder bursitis 10/12/2014   Injected in 10/12/2014  Repeat 01/21/2015   Small thenar eminence 02/03/2016   Spinal stenosis, lumbar region, without neurogenic claudication 10/12/2014   Spondylolisthesis at L5-S1 level 05/09/2021   Strain of latissimus dorsi muscle 01/21/2015   Tendinopathy of right biceps tendon 07/25/2019   Type 2 diabetes mellitus with hyperglycemia, without long-term current use of insulin (HCC) 12/22/2013   Ulnar neuropathy at elbow of right upper extremity 01/03/2019    BP 128/68   Pulse 86   Ht 5\' 10"  (1.778 m)   Wt 200 lb (90.7 kg)   SpO2 98%   BMI 28.70 kg/m  Gen- awake, alert, appearing stated age Heart- RRR Lungs- CTAB, no accessory muscle use Neuro- no facial tics Psych- age appropriate judgment and insight, normal mood and affect  Attention deficit disorder (ADD) without hyperactivity - Plan: Drug Monitoring Panel [phone] , Urine  Hypogonadism in male - Plan: PSA, Testosterone  Type 2 diabetes mellitus with hyperglycemia, without long-term current use of insulin (HCC) - Plan: Urine Microalbumin w/creat. ratio, Comprehensive metabolic panel with GFR  B12 deficiency - Plan: Intrinsic Factor Antibodies, B12, cyanocobalamin (VITAMIN B12) injection  1,000 mcg  Hoarseness - Plan: Ambulatory referral to ENT  Chronic, stable.  Continue Ritalin 20 mg twice daily.  UDS and CSA updated today. Chronic, stable.  Continue AndroGel 2 pumps daily.  Check labs. Appreciate endocrinology input.  Check blood work. Injection today, follow-up on  IIF antibodies and B12 levels. I see that the allergy team referred him to ENT in their note but he has not heard anything.  I will place a referral. F/u in 6 mo for CPE. Pt voiced understanding and agreement to the plan.  Jilda Roche Boaz, DO 08/09/23 3:14 PM

## 2023-08-10 ENCOUNTER — Other Ambulatory Visit: Payer: Self-pay

## 2023-08-10 ENCOUNTER — Ambulatory Visit: Admitting: "Endocrinology

## 2023-08-10 ENCOUNTER — Other Ambulatory Visit (HOSPITAL_BASED_OUTPATIENT_CLINIC_OR_DEPARTMENT_OTHER): Payer: Self-pay

## 2023-08-10 ENCOUNTER — Encounter: Payer: Self-pay | Admitting: "Endocrinology

## 2023-08-10 ENCOUNTER — Encounter: Payer: Self-pay | Admitting: Family Medicine

## 2023-08-10 ENCOUNTER — Other Ambulatory Visit (HOSPITAL_COMMUNITY): Payer: Self-pay

## 2023-08-10 VITALS — BP 124/80 | HR 85 | Ht 70.0 in | Wt 201.0 lb

## 2023-08-10 DIAGNOSIS — Z7984 Long term (current) use of oral hypoglycemic drugs: Secondary | ICD-10-CM

## 2023-08-10 DIAGNOSIS — E1165 Type 2 diabetes mellitus with hyperglycemia: Secondary | ICD-10-CM | POA: Diagnosis not present

## 2023-08-10 DIAGNOSIS — E7800 Pure hypercholesterolemia, unspecified: Secondary | ICD-10-CM

## 2023-08-10 LAB — MICROALBUMIN / CREATININE URINE RATIO
Creatinine,U: 95.5 mg/dL
Microalb Creat Ratio: 24 mg/g (ref 0.0–30.0)
Microalb, Ur: 2.3 mg/dL — ABNORMAL HIGH (ref 0.0–1.9)

## 2023-08-10 LAB — COMPREHENSIVE METABOLIC PANEL WITH GFR
ALT: 26 U/L (ref 0–53)
AST: 19 U/L (ref 0–37)
Albumin: 4.6 g/dL (ref 3.5–5.2)
Alkaline Phosphatase: 66 U/L (ref 39–117)
BUN: 16 mg/dL (ref 6–23)
CO2: 23 meq/L (ref 19–32)
Calcium: 9.9 mg/dL (ref 8.4–10.5)
Chloride: 105 meq/L (ref 96–112)
Creatinine, Ser: 0.76 mg/dL (ref 0.40–1.50)
GFR: 91.25 mL/min (ref >60.00)
Glucose, Bld: 120 mg/dL — ABNORMAL HIGH (ref 70–99)
Potassium: 3.8 meq/L (ref 3.5–5.1)
Sodium: 142 meq/L (ref 135–145)
Total Bilirubin: 0.4 mg/dL (ref 0.2–1.2)
Total Protein: 7.2 g/dL (ref 6.0–8.3)

## 2023-08-10 LAB — TESTOSTERONE: Testosterone: 188.1 ng/dL — ABNORMAL LOW (ref 300.00–890.00)

## 2023-08-10 LAB — VITAMIN B12: Vitamin B-12: >1537 pg/mL — ABNORMAL HIGH (ref 211–911)

## 2023-08-10 LAB — PSA: PSA: 1.88 ng/mL (ref 0.10–4.00)

## 2023-08-10 MED ORDER — EMPAGLIFLOZIN 25 MG PO TABS
25.0000 mg | ORAL_TABLET | Freq: Every day | ORAL | 1 refills | Status: DC
Start: 2023-08-10 — End: 2024-02-25
  Filled 2023-08-10: qty 90, 90d supply, fill #0
  Filled 2023-11-10: qty 90, 90d supply, fill #1

## 2023-08-10 NOTE — Progress Notes (Signed)
 Outpatient Endocrinology Note Reginald Upham, MD  08/10/23   Reginald Rios 01-25-1956 [id number]  Referring Provider: Sharlene Rios* Primary Care Provider: Sharlene Dory, DO Reason for consultation: Subjective   Assessment & Plan  Diagnoses and all orders for this visit:  Uncontrolled type 2 diabetes mellitus with hyperglycemia (HCC) -     empagliflozin (JARDIANCE) 25 MG TABS tablet; Take 1 tablet (25 mg total) by mouth daily before breakfast. -     Ambulatory referral to diabetic education  Long term (current) use of oral hypoglycemic drugs  Pure hypercholesterolemia   Diabetes Type II complicated by hyperglycemia,  Lab Results  Component Value Date   GFR 91.25 08/09/2023   Hba1c goal less than 7, current Hba1c is  Lab Results  Component Value Date   HGBA1C 9.3 (A) 07/08/2023   Will recommend the following: Jardiance 25 mg every day  Metformin 1000 mg bid Actos 45 mg every day  Gburide 2.5 mg every day PRN (BG >220)  No history of heart attack/heart failure/pedal edema/urinary bladder cancer  No known contraindications/side effects to any of above medications  -Last LD and Tg are as follows: Lab Results  Component Value Date   LDLCALC 55 04/13/2023    Lab Results  Component Value Date   TRIG 67.0 04/13/2023   -On rosuvastatin 5 mg every day, ezetimibe 10 mg qd (has cramps with statin before)  -Follow low fat diet and exercise   -Blood pressure goal <140/90 - Microalbumin/creatinine goal is < 30 -Last MA/Cr is as follows: Lab Results  Component Value Date   MICROALBUR 2.3 (H) 08/09/2023   -on ACE/ARB losartan 25 mg qd -diet changes including salt restriction -limit eating outside -counseled BP targets per standards of diabetes care -uncontrolled blood pressure can lead to retinopathy, nephropathy and cardiovascular and atherosclerotic heart disease  Reviewed and counseled on: -A1C target -Blood sugar  targets -Complications of uncontrolled diabetes  -Checking blood sugar before meals and bedtime and bring log next visit -All medications with mechanism of action and side effects -Hypoglycemia management: rule of 15's, Glucagon Emergency Kit and medical alert ID -low-carb low-fat plate-method diet -At least 20 minutes of physical activity per day -Annual dilated retinal eye exam and foot exam -compliance and follow up needs -follow up as scheduled or earlier if problem gets worse  Call if blood sugar is less than 70 or consistently above 250    Take a 15 gm snack of carbohydrate at bedtime before you go to sleep if your blood sugar is less than 100.    If you are going to fast after midnight for a test or procedure, ask your physician for instructions on how to reduce/decrease your insulin dose.    Call if blood sugar is less than 70 or consistently above 250  -Treating a low sugar by rule of 15  (15 gms of sugar every 15 min until sugar is more than 70) If you feel your sugar is low, test your sugar to be sure If your sugar is low (less than 70), then take 15 grams of a fast acting Carbohydrate (3-4 glucose tablets or glucose gel or 4 ounces of juice or regular soda) Recheck your sugar 15 min after treating low to make sure it is more than 70 If sugar is still less than 70, treat again with 15 grams of carbohydrate          Don't drive the hour of hypoglycemia  If unconscious/unable to eat  or drink by mouth, use glucagon injection or nasal spray baqsimi and call 911. Can repeat again in 15 min if still unconscious.  Return in about 2 months (around 10/10/2023).   I have reviewed current medications, nurse's notes, allergies, vital signs, past medical and surgical history, family medical history, and social history for this encounter. Counseled patient on symptoms, examination findings, lab findings, imaging results, treatment decisions and monitoring and prognosis. The patient  understood the recommendations and agrees with the treatment plan. All questions regarding treatment plan were fully answered.  Reginald McGrath, MD  08/10/23    History of Present Illness Reginald Rios is a 68 y.o. year old male who presents for evaluation of Type II diabetes mellitus.  Reginald Rios was first diagnosed around 2015.  Diabetes education +  Home diabetes regimen: Jardiance 10 mg every day Metformin 1000 mg bid Actos 45 mg every day   Stopped Glyburide 2.5 mg every day  COMPLICATIONS -  MI/Stroke -  retinopathy -  neuropathy -  nephropathy  SYMPTOMS REVIEWED - Polyuria - Weight loss - Blurred vision  BLOOD SUGAR DATA 114-223, checks tidac   Physical Exam  BP 124/80   Pulse 85   Ht 5\' 10"  (1.778 m)   Wt 201 lb (91.2 kg)   SpO2 97%   BMI 28.84 kg/m    Constitutional: well developed, well nourished Head: normocephalic, atraumatic Eyes: sclera anicteric, no redness Neck: supple Lungs: normal respiratory effort Neurology: alert and oriented Skin: dry, no appreciable rashes Musculoskeletal: no appreciable defects Psychiatric: normal mood and affect Diabetic Foot Exam - Simple   No data filed      Current Medications Patient's Medications  New Prescriptions   EMPAGLIFLOZIN (JARDIANCE) 25 MG TABS TABLET    Take 1 tablet (25 mg total) by mouth daily before breakfast.  Previous Medications   ACETAMINOPHEN (TYLENOL) 500 MG TABLET    Take 1,000 mg by mouth every 6 (six) hours as needed for moderate pain (pain score 4-6).   ALBUTEROL (VENTOLIN HFA) 108 (90 BASE) MCG/ACT INHALER    Inhale 2 puffs by mouth every 4-6 hours as needed for coughing or wheezing spells   AMLODIPINE (NORVASC) 2.5 MG TABLET    Take 1 tablet (2.5 mg total) by mouth daily.   AZELASTINE (ASTELIN) 0.1 % NASAL SPRAY    Place 2 sprays into both nostrils 2 (two) times daily as needed for rhinitis (nasal congestion).   BACLOFEN (LIORESAL) 10 MG TABLET    Take 1 tablet (10 mg  total) by mouth 3 (three) times daily as needed for muscle spasms.   BLOOD GLUCOSE MONITORING SUPPL (FREESTYLE FREEDOM LITE) W/DEVICE KIT    Use to check blood sugar daily.   CALCIUM CARB-CHOLECALCIFEROL (CALCIUM + D3 PO)    Take 1 capsule by mouth daily.   CLOPIDOGREL (PLAVIX) 75 MG TABLET    Take 1 tablet (75 mg total) by mouth daily.   DICLOFENAC SODIUM (VOLTAREN) 1 % GEL    Apply 2 g topically 4 (four) times daily.   EZETIMIBE (ZETIA) 10 MG TABLET    Take 1 tablet (10 mg total) by mouth daily.   FLUTICASONE (FLONASE) 50 MCG/ACT NASAL SPRAY    Place 2 sprays into both nostrils daily.   FLUTICASONE (FLOVENT HFA) 110 MCG/ACT INHALER    Inhale 2 puffs into the lungs 2 (two) times daily. Use with spacer. Rinse, gargle, and spit after use.   FLUTICASONE FUROATE (ARNUITY ELLIPTA) 50 MCG/ACT AEPB  Inhale 1 puff into the lungs daily.   GLUCAGON (GVOKE HYPOPEN 1-PACK) 1 MG/0.2ML SOAJ    Inject into the skin in the event of low sugar.   GLUCOSE BLOOD (FREESTYLE LITE) TEST STRIP    Use daily to check blood sugar.   GLYBURIDE (DIABETA) 2.5 MG TABLET    Take 1 tablet (2.5 mg total) by mouth daily with breakfast.   LANCETS (FREESTYLE) LANCETS    Use daily to check blood sugar   LOSARTAN (COZAAR) 25 MG TABLET    Take 1 tablet (25 mg total) by mouth daily.   MECLIZINE (ANTIVERT) 25 MG TABLET    Take 1 tablet (25 mg total) by mouth 3 (three) times daily as needed for dizziness.   METFORMIN (GLUCOPHAGE) 1000 MG TABLET    Take 1 tablet (1,000 mg total) by mouth 2 (two) times daily with a meal.   METHYLPHENIDATE (RITALIN) 20 MG TABLET    Take 1 tablet (20 mg total) by mouth 2 (two) times daily.   METHYLPHENIDATE (RITALIN) 20 MG TABLET    Take 1 tablet (20 mg total) by mouth 2 (two) times daily.   METHYLPHENIDATE (RITALIN) 20 MG TABLET    Take 1 tablet (20 mg total) by mouth 2 (two) times daily.   MONTELUKAST (SINGULAIR) 10 MG TABLET    Take 1 tablet (10 mg total) by mouth daily.   MULTIPLE VITAMIN  (MULTIVITAMIN) TABLET    Take 1 tablet by mouth daily.   NITROGLYCERIN (NITROSTAT) 0.4 MG SL TABLET    Place 0.4 mg under the tongue every 5 (five) minutes as needed for chest pain.   PANTOPRAZOLE (PROTONIX) 40 MG TABLET    Take 1 tablet (40 mg total) by mouth daily.   PIOGLITAZONE (ACTOS) 45 MG TABLET    Take 1 tablet (45 mg total) by mouth daily.   ROSUVASTATIN (CRESTOR) 5 MG TABLET    Take 1 tablet (5 mg total) by mouth daily.   SILDENAFIL (VIAGRA) 100 MG TABLET    Take 1 tablet (100 mg total) by mouth daily as needed. Take 1 hour prior to sexual activity   TESTOSTERONE (ANDROGEL) 50 MG/5GM (1%) GEL    APPLY 2 APPLICATIONS ON TO THE SKIN DAILY   VITAMIN D, ERGOCALCIFEROL, (DRISDOL) 1.25 MG (50000 UNIT) CAPS CAPSULE    Take 1 capsule (50,000 Units total) by mouth every 7 (seven) days.  Modified Medications   No medications on file  Discontinued Medications   EMPAGLIFLOZIN (JARDIANCE) 10 MG TABS TABLET    Take 1 tablet (10 mg total) by mouth daily before breakfast.    Allergies Allergies  Allergen Reactions   Invokana [Canagliflozin] Rash   Grass Pollen(K-O-R-T-Swt Vern)    Lisinopril Cough   Semaglutide Nausea Only    Causes cramping. 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   BPPV (benign paroxysmal positional vertigo) 08/14/2014   CAD (coronary artery disease)    2/19 PCI/DESx1 to Lcx   Cervical disc disorder with radiculopathy of cervical region 10/07/2015   Cervical radicular pain 01/10/2019   Chronic cough  04/13/2019   Decreased  cardiac ejection fraction 04/02/2015   Diabetes mellitus without complication (HCC)    Dyspnea on exertion 05/15/2015   Erectile dysfunction 07/11/2019   Exercise-induced asthma    Family history of early CAD 07/05/2017   Fatigue 05/15/2015   Hallux rigidus, right foot 09/15/2021   Heart murmur    Hemarthrosis of right elbow 02/03/2016   Hoarseness 04/13/2019   Hyperlipidemia    family hx of high cholesterol   Hypogonadism in male    Incomplete rotator cuff tear 09/10/2015   Injected 09/10/2015   Lateral epicondylitis of right elbow 07/28/2019   Low back pain at multiple sites 02/05/2021   Low vitamin D level 04/22/2017   Mild persistent asthma/cough variant asthma 04/13/2019   Myofascial pain syndrome, cervical 07/28/2019   Neck pain 06/01/2018   Nonischemic cardiomyopathy (HCC) 05/15/2015   Nontraumatic incomplete tear of right rotator cuff 07/25/2019   Occipital headache 06/01/2018   Onychomycosis 05/25/2018   Osteoarthritis    Pain in right ankle and joints of right foot 10/05/2016   Palpitations 07/06/2017   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   Shoulder bursitis 10/12/2014   Injected in 10/12/2014  Repeat 01/21/2015   Small thenar eminence 02/03/2016   Spinal stenosis, lumbar region, without neurogenic claudication 10/12/2014   Spondylolisthesis at L5-S1 level 05/09/2021   Strain of latissimus dorsi muscle 01/21/2015   Tendinopathy of right biceps tendon 07/25/2019   Type 2 diabetes mellitus with hyperglycemia, without long-term current use of insulin (HCC) 12/22/2013   Ulnar neuropathy at elbow of right upper extremity 01/03/2019    Past Surgical History Past Surgical History:  Procedure Laterality Date   CORONARY PRESSURE/FFR STUDY N/A 06/23/2017   Procedure: INTRAVASCULAR PRESSURE WIRE/FFR STUDY;  Surgeon: Marykay Lex, MD;  Location: [facility] INVASIVE CV LAB;  Service: Cardiovascular;  Laterality: N/A;   CORONARY STENT INTERVENTION N/A 06/23/2017   Procedure: CORONARY  STENT INTERVENTION;  Surgeon: Marykay Lex, MD;  Location[facility] INVASIVE CV LAB;  Service: Cardiovascular;  Laterality: N/A;   KNEE ARTHROSCOPY     LEFT HEART CATH AND CORONARY ANGIOGRAPHY N/A 06/23/2017   Procedure: LEFT HEART CATH AND CORONARY ANGIOGRAPHY;  Surgeon: Marykay Lex, MD;  Location[facility] INVASIVE CV LAB;  Service: Cardiovascular;  Laterality: N/A;   LEFT HEART CATH AND CORONARY ANGIOGRAPHY N/A 07/14/2023   Procedure: LEFT HEART CATH AND CORONARY ANGIOGRAPHY;  Surgeon: Elder Negus, MD;  Location: [facility] INVASIVE CV LAB;  Service: Cardiovascular;  Laterality: N/A;   SHOULDER ARTHROSCOPY WITH BICEPSTENOTOMY Right 09/06/2019   Procedure: SHOULDER ARTHROSCOPY WITH BICEPSTENOTOMY;  Surgeon: Tarry Kos, MD;  Location: [facility];  Service: Orthopedics;  Laterality: Right;   SHOULDER ARTHROSCOPY WITH SUBACROMIAL DECOMPRESSION Right 09/06/2019   Procedure: RIGHT SHOULDER ARTHROSCOPY WITH EXTENSIVE DEBRIDEMENT, SUBACROMIAL DECOMPRESSION, BICEPS TENOTOMY;  Surgeon: Tarry Kos, MD;  Location: [facility];  Service: Orthopedics;  Laterality: Right;   TENOTOMY ACHILLES TENDON      Family History family history includes Asthma in his paternal aunt; Cancer in his mother; Coronary artery disease (age of onset: 90) in his father; Food Allergy in his son; Heart disease in his father; Hypothyroidism in his brother; Lung cancer (age of onset: 37) in his mother.  Social History Social History   Socioeconomic History   Marital status: Married    Spouse name: Not on file   Number of children: Not on file   Years of education: Not on file   Highest education level: Some  college, no degree  Occupational History   Occupation: Conservation officer, historic buildings   Tobacco Use   Smoking status: Never   Smokeless tobacco: Never  Vaping Use   Vaping status: Never Used  Substance and Sexual Activity   Alcohol use: Yes    Alcohol/week: 2.0 standard drinks of alcohol    Types: 2  Glasses of wine per week    Comment: rare   Drug use: No   Sexual activity: Not on file  Other Topics Concern   Not on file  Social History Narrative   Previously worked as Environmental manager   Currently working at [employer]   Married 30 years   2 children ages 59, 59   Wife is psychiatrist - Dr. Annitta Needs   Moved from [place]   Grew up in [place]   Social Drivers of Health   Financial Resource Strain: Low Risk  (05/07/2023)   Overall Financial Resource Strain (CARDIA)    Difficulty of Paying Living Expenses: Not hard at all  Food Insecurity: No Food Insecurity (05/07/2023)   Hunger Vital Sign    Worried About Running Out of Food in the Last Year: Never true    Ran Out of Food in the Last Year: Never true  Transportation Needs: No Transportation Needs (05/07/2023)   PRAPARE - Administrator, Civil Service (Medical): No    Lack of Transportation (Non-Medical): No  Physical Activity: Sufficiently Active (05/07/2023)   Exercise Vital Sign    Days of Exercise per Week: 7 days    Minutes of Exercise per Session: 30 min  Stress: No Stress Concern Present (05/07/2023)   [employer] of Occupational Health - Occupational Stress Questionnaire    Feeling of Stress : Not at all  Social Connections: Socially Integrated (05/07/2023)   Social Connection and Isolation Panel [NHANES]    Frequency of Communication with Friends and Family: More than three times a week    Frequency of Social Gatherings with Friends and Family: Once a week    Attends Religious Services: 1 to 4 times per year    Active Member of Clubs or Organizations: Yes    Attends Banker Meetings: 1 to 4 times per year    Marital Status: Married  Catering manager Violence: Not on file    Lab Results  Component Value Date   HGBA1C 9.3 (A) 07/08/2023   HGBA1C 8.0 (H) 01/26/2023   HGBA1C 7.8 (H) 10/20/2022   Lab Results  Component Value Date   CHOL 114 04/13/2023   Lab Results  Component  Value Date   HDL 45.00 04/13/2023   Lab Results  Component Value Date   LDLCALC 55 04/13/2023   Lab Results  Component Value Date   TRIG 67.0 04/13/2023   Lab Results  Component Value Date   CHOLHDL 3 04/13/2023   Lab Results  Component Value Date   CREATININE 0.76 08/09/2023   Lab Results  Component Value Date   GFR 91.25 08/09/2023   Lab Results  Component Value Date   MICROALBUR 2.3 (H) 08/09/2023      Component Value Date/Time   NA 142 08/09/2023 1428   NA 139 04/22/2022 1439   K 3.8 08/09/2023 1428   CL 105 08/09/2023 1428   CO2 23 08/09/2023 1428   GLUCOSE 120 (H) 08/09/2023 1428   BUN 16 08/09/2023 1428   BUN 12 04/22/2022 1439   CREATININE 0.76 08/09/2023 1428   CREATININE 0.82 07/05/2023 1008   CALCIUM 9.9 08/09/2023  1428   PROT 7.2 08/09/2023 1428   PROT 6.8 04/22/2022 1439   ALBUMIN 4.6 08/09/2023 1428   ALBUMIN 4.6 04/22/2022 1439   AST 19 08/09/2023 1428   AST 19 03/06/2022 1331   ALT 26 08/09/2023 1428   ALT 20 03/06/2022 1331   ALKPHOS 66 08/09/2023 1428   BILITOT 0.4 08/09/2023 1428   BILITOT 0.3 04/22/2022 1439   BILITOT 0.4 03/06/2022 1331   GFRNONAA >60 03/06/2022 1331   GFRAA >60 08/30/2019 1300      Latest Ref Rng & Units 08/09/2023    2:28 PM 07/05/2023    4:14 PM 07/05/2023   10:08 AM  BMP  Glucose 70 - 99 mg/dL 161  096  045   BUN 6 - 23 mg/dL 16  20  17    Creatinine 0.40 - 1.50 mg/dL 4.09  8.11  9.14   BUN/Creat Ratio 6 - 22 (calc)   SEE NOTE:   Sodium 135 - 145 mEq/L 142  137  139   Potassium 3.5 - 5.1 mEq/L 3.8  4.2  4.0   Chloride 96 - 112 mEq/L 105  103  104   CO2 19 - 32 mEq/L 23  27  28    Calcium 8.4 - 10.5 mg/dL 9.9  9.3  9.2        Component Value Date/Time   WBC 4.6 07/05/2023 1614   RBC 3.65 (L) 07/05/2023 1614   HGB 12.2 (L) 07/05/2023 1614   HGB 12.5 (L) 03/06/2022 1331   HGB 13.1 06/11/2017 1302   HCT 36.5 (L) 07/05/2023 1614   HCT 38.8 06/11/2017 1302   PLT 198.0 07/05/2023 1614   PLT 227 03/06/2022 1331    PLT 235 06/11/2017 1302   MCV 100.1 (H) 07/05/2023 1614   MCV 96 06/11/2017 1302   MCH 33.1 03/06/2022 1331   MCHC 33.4 07/05/2023 1614   RDW 14.6 07/05/2023 1614   RDW 14.7 06/11/2017 1302   LYMPHSABS 1.5 07/05/2023 1614   LYMPHSABS 1.9 04/21/2017 1019   MONOABS 0.4 07/05/2023 1614   EOSABS 0.1 07/05/2023 1614   EOSABS 0.1 04/21/2017 1019   BASOSABS 0.1 07/05/2023 1614   BASOSABS 0.0 04/21/2017 1019     Parts of this note may have been dictated using voice recognition software. There may be variances in spelling and vocabulary which are unintentional. Not all errors are proofread. Please notify the Thereasa Parkin if any discrepancies are noted or if the meaning of any statement is not clear.

## 2023-08-10 NOTE — Patient Instructions (Signed)

## 2023-08-11 LAB — DRUG MONITORING PANEL 376104, URINE
Amphetamines: NEGATIVE ng/mL (ref <500)
Barbiturates: NEGATIVE ng/mL (ref <300)
Benzodiazepines: NEGATIVE ng/mL (ref <100)
Cocaine Metabolite: NEGATIVE ng/mL (ref <150)
Desmethyltramadol: NEGATIVE ng/mL (ref <100)
Opiates: NEGATIVE ng/mL (ref <100)
Oxycodone: NEGATIVE ng/mL (ref <100)
Tramadol: NEGATIVE ng/mL (ref <100)

## 2023-08-11 LAB — INTRINSIC FACTOR ANTIBODIES: Intrinsic Factor: POSITIVE — AB

## 2023-08-11 LAB — DM TEMPLATE

## 2023-08-12 ENCOUNTER — Encounter: Payer: Self-pay | Admitting: Family Medicine

## 2023-08-12 DIAGNOSIS — D510 Vitamin B12 deficiency anemia due to intrinsic factor deficiency: Secondary | ICD-10-CM | POA: Insufficient documentation

## 2023-08-13 DIAGNOSIS — M4317 Spondylolisthesis, lumbosacral region: Secondary | ICD-10-CM | POA: Diagnosis not present

## 2023-08-13 DIAGNOSIS — R7989 Other specified abnormal findings of blood chemistry: Secondary | ICD-10-CM

## 2023-08-13 DIAGNOSIS — Z6828 Body mass index (BMI) 28.0-28.9, adult: Secondary | ICD-10-CM | POA: Diagnosis not present

## 2023-08-13 NOTE — Telephone Encounter (Signed)
 Copied from CRM [id number]. Topic: Appointments - Appointment Scheduling >> Aug 13, 2023  3:21 PM Almira Coaster wrote: Patient/patient representative is calling to schedule an appointment. Refer to attachments for appointment information. Patient scheduled lab appointment 08/23/2023 and monthly b12 for 09/08/2023.

## 2023-08-16 ENCOUNTER — Ambulatory Visit: Admitting: Cardiology

## 2023-08-16 ENCOUNTER — Other Ambulatory Visit (HOSPITAL_BASED_OUTPATIENT_CLINIC_OR_DEPARTMENT_OTHER): Payer: Self-pay

## 2023-08-17 ENCOUNTER — Encounter: Payer: Self-pay | Admitting: Dietician

## 2023-08-17 ENCOUNTER — Encounter: Attending: "Endocrinology | Admitting: Dietician

## 2023-08-17 ENCOUNTER — Telehealth: Payer: Self-pay

## 2023-08-17 VITALS — Ht 70.0 in | Wt 200.0 lb

## 2023-08-17 DIAGNOSIS — E1165 Type 2 diabetes mellitus with hyperglycemia: Secondary | ICD-10-CM | POA: Insufficient documentation

## 2023-08-17 NOTE — Patient Instructions (Addendum)
 Consider this breakfast change:  Scrambled egg, lean sausage, 2 slices whole wheat bread, 1 fresh fruit (45 grams carbs)  1 pack of plain instant oatmeal + 1 pack of instant oatmeal, 1 portion fruit, handful of walnuts (60 grams carbs)  [place] yogurt, 1/4 cup granola, handful of nuts, berries (45-60 grams carbs)  Egg and cheese sandwich on [employer] english muffin, fresh fruit (45 grams carbs)   1-2 Whole grain waffles, sugar free syrup, berries, nuts or sausage or egg (45 grams carbs)    Lunch idea:  Carrots, hummus, handful of nuts, fresh fruit, (light idea), fresh fruit  Sandwich, fresh fruit  Mindful choices - get out of previous habits (such as portion size of pancakes)

## 2023-08-17 NOTE — Progress Notes (Unsigned)
 Diabetes Self-Management Education  Visit Type: First/Initial  Appt. Start Time: 1405 Appt. End Time: 1535  08/17/2023  [name], identified by name and date of birth, is a 68 y.o. male with a diagnosis of Diabetes: Type 2.   ASSESSMENT Patient is here today alone. He states that he needs to get his A1C lower ot qualify for back surgery.  Referral:  Type 2 Diabetes  History includes:  Type 2 Diabetes (1993), back/neck issues, hoarse, HLD, neuropathy, GERD, ADD, pernicious anemia Medications includes:  Jardiance, glyburide (prn when glucose is >200), Metformin, Pioglitizone, calcium with D3, Vitamin B-12 injection, Vitamin D 50,000 units once per week Labs noted to include:  A1C 9.3% 07/08/2023 increased 8% 01/26/2023, Vitamin B-12 was 110 on 07/05/23 and increased to 1537 on 08/09/2023 after a B-12 injection, Vitamin D 28 on 07/05/2023, positive intrinsic factor 08/09/2023.  68" 200 lbs 08/16/2024 210 lbs 02/2023  Patient lives with his wife and 2 adult children.  His wife is a Therapist, sports for [employer].  She does the shopping and they get Hello Fresh and patient cooks these meals.  They are choose more plant based and less carbs.  Avoids added salt.  Avoids eating out. Was playing pickle ball 3 times per week, walking dog 2 times per day, and yoga twice per week prior to increased back surgery and has since decreased this to 1-2 times per week. 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 Increased pain and some depression from chronic pain. Height 5\' 10"  (1.778 m), weight 200 lb (90.7 kg). Body mass index is 28.7 kg/m.   Diabetes Self-Management Education - 08/17/23 1450       Visit Information   Visit Type First/Initial      Initial Visit   Diabetes Type Type 2    Date Diagnosed 1993    Are you currently following a meal plan? Yes    What type of meal plan do you follow? diabetes    Are you taking your medications as prescribed? Yes       Health Coping   How would you rate your overall health? Fair      Psychosocial Assessment   Patient Belief/Attitude about Diabetes Afraid    What is the hardest part about your diabetes right now, causing you the most concern, or is the most worrisome to you about your diabetes?   Making healty food and beverage choices    Self-care barriers None    Self-management support Doctor's office;Family    Other persons present Patient    Patient Concerns Nutrition/Meal planning;Healthy Lifestyle;Problem Solving;Glycemic Control    Special Needs None    Preferred Learning Style No preference indicated    Learning Readiness Ready    How often do you need to have someone help you when you read instructions, pamphlets, or other written materials from your doctor or pharmacy? 1 - Never    What is the last grade level you completed in school? some college      Pre-Education Assessment   Patient understands the diabetes disease and treatment process. Needs Review    Patient understands incorporating nutritional management into lifestyle. Needs Review    Patient undertands incorporating physical activity into lifestyle. Needs Review    Patient understands using medications safely. Needs Review    Patient understands monitoring blood glucose, interpreting and using results Needs Review    Patient understands prevention, detection, and treatment of acute complications. Needs Review  Patient understands prevention, detection, and treatment of chronic complications. Needs Review    Patient understands how to develop strategies to address psychosocial issues. Needs Review    Patient understands how to develop strategies to promote health/change behavior. Needs Review      Complications   Last HgB A1C per patient/outside source 9.3 %   07/08/2023 increased from 8% 924/2025   Fasting Blood glucose range (mg/dL) 16-109;604-540   <981   Postprandial Blood glucose range (mg/dL) >191;478-295;621-308    Number  of hypoglycemic episodes per month 0    Number of hyperglycemic episodes ( >200mg /dL): Weekly    Can you tell when your blood sugar is high? Yes    What do you do if your blood sugar is high? walks and/or takes glyburide    Have you had a dilated eye exam in the past 12 months? Yes    Have you had a dental exam in the past 12 months? Yes    Are you checking your feet? Yes    How many days per week are you checking your feet? 7      Dietary Intake   Breakfast 2 packs Flavored instant Oatmeal, banana    Lunch Ham and swiss on [employer] bread or skips and just has a small bag of ritz crackers    Snack (afternoon) apple    Dinner [employer], apple OR hello fresh meal - chicken, potatoes, vegetable    Snack (evening) orange    Beverage(s) Water, Black tea, splenda, skim milk, mio, diet Coke      Activity / Exercise   Activity / Exercise Type Light (walking / raking leaves)    How many days per week do you exercise? 7    How many minutes per day do you exercise? 20    Total minutes per week of exercise 140      Patient Education   Previous Diabetes Education Yes (please comment)   10 years ago   Disease Pathophysiology Explored patient's options for treatment of their diabetes    Healthy Eating Meal options for control of blood glucose level and chronic complications.;Role of diet in the treatment of diabetes and the relationship between the three main macronutrients and blood glucose level;Plate Method    Being Active Role of exercise on diabetes management, blood pressure control and cardiac health.    Medications Reviewed patients medication for diabetes, action, purpose, timing of dose and side effects.    Monitoring Taught/evaluated SMBG meter.;Daily foot exams;Yearly dilated eye exam;Identified appropriate SMBG and/or A1C goals.;Purpose and frequency of SMBG.;Taught/discussed recording of test results and interpretation of SMBG.    Acute complications Taught prevention, symptoms, and   treatment of hypoglycemia - the 15 rule.    Diabetes Stress and Support Identified and addressed patients feelings and concerns about diabetes;Worked with patient to identify barriers to care and solutions;Role of stress on diabetes      Individualized Goals (developed by patient)   Nutrition General guidelines for healthy choices and portions discussed    Physical Activity Not Applicable    Medications take my medication as prescribed    Monitoring  Test my blood glucose as discussed    Problem Solving Eating Pattern    Reducing Risk examine blood glucose patterns;do foot checks daily;treat hypoglycemia with 15 grams of carbs if blood glucose less than 70mg /dL      Post-Education Assessment   Patient understands the diabetes disease and treatment process. Comprehends key points    Patient understands  incorporating nutritional management into lifestyle. Needs Review    Patient undertands incorporating physical activity into lifestyle. Comprehends key points    Patient understands using medications safely. Demonstrates understanding / competency    Patient understands monitoring blood glucose, interpreting and using results Comprehends key points    Patient understands prevention, detection, and treatment of acute complications. Comprehends key points    Patient understands prevention, detection, and treatment of chronic complications. Comprehends key points    Patient understands how to develop strategies to address psychosocial issues. Comprehends key points    Patient understands how to develop strategies to promote health/change behavior. Needs Review      Outcomes   Expected Outcomes Demonstrated interest in learning. Expect positive outcomes    Future DMSE 2 months    Program Status Not Completed             Individualized Plan for Diabetes Self-Management Training:   Learning Objective:  Patient will have a greater understanding of diabetes self-management. Patient education  plan is to attend individual and/or group sessions per assessed needs and concerns.   Plan:   Patient Instructions  Consider this breakfast change:  Scrambled egg, lean sausage, 2 slices whole wheat bread, 1 fresh fruit (45 grams carbs)  1 pack of plain instant oatmeal + 1 pack of instant oatmeal, 1 portion fruit, handful of walnuts (60 grams carbs)  [place] yogurt, 1/4 cup granola, handful of nuts, berries (45-60 grams carbs)  Egg and cheese sandwich on [employer] english muffin, fresh fruit (45 grams carbs)   1-2 Whole grain waffles, sugar free syrup, berries, nuts or sausage or egg (45 grams carbs)    Lunch idea:  Carrots, hummus, handful of nuts, fresh fruit, (light idea), fresh fruit  Sandwich, fresh fruit  Mindful choices - get out of previous habits (such as portion size of pancakes)     Expected Outcomes:  Demonstrated interest in learning. Expect positive outcomes  Education material provided: ADA - How to Thrive: A Guide for Your Journey with Diabetes, Meal plan card, Snack sheet, and Diabetes Resources  If problems or questions, patient to contact team via:  Phone  Future DSME appointment: 2 months

## 2023-08-17 NOTE — Telephone Encounter (Signed)
 [facility] sent over a PreOp form for the pt. No surgery date yet. You added a note that the pt needed an appt.   Pt was seen recently on 08/09/23. Does he still need an OV?  (Form does not ask for EKG, only ov notes).

## 2023-08-23 ENCOUNTER — Other Ambulatory Visit (INDEPENDENT_AMBULATORY_CARE_PROVIDER_SITE_OTHER)

## 2023-08-23 DIAGNOSIS — R7989 Other specified abnormal findings of blood chemistry: Secondary | ICD-10-CM

## 2023-08-24 ENCOUNTER — Encounter: Payer: Self-pay | Admitting: Family Medicine

## 2023-08-25 ENCOUNTER — Other Ambulatory Visit (HOSPITAL_BASED_OUTPATIENT_CLINIC_OR_DEPARTMENT_OTHER): Payer: Self-pay

## 2023-08-25 ENCOUNTER — Other Ambulatory Visit: Payer: Self-pay | Admitting: Internal Medicine

## 2023-08-25 ENCOUNTER — Other Ambulatory Visit: Payer: Self-pay

## 2023-08-25 DIAGNOSIS — J383 Other diseases of vocal cords: Secondary | ICD-10-CM | POA: Diagnosis not present

## 2023-08-25 DIAGNOSIS — J3089 Other allergic rhinitis: Secondary | ICD-10-CM

## 2023-08-25 DIAGNOSIS — R490 Dysphonia: Secondary | ICD-10-CM | POA: Diagnosis not present

## 2023-08-25 DIAGNOSIS — B3781 Candidal esophagitis: Secondary | ICD-10-CM | POA: Diagnosis not present

## 2023-08-25 MED ORDER — AZELASTINE HCL 0.1 % NA SOLN
2.0000 | Freq: Two times a day (BID) | NASAL | 3 refills | Status: AC | PRN
  Filled 2023-08-25 – 2023-09-03 (×2): qty 30, 50d supply, fill #0
  Filled 2023-09-06: qty 30, 30d supply, fill #0
  Filled 2023-11-28: qty 30, 30d supply, fill #1

## 2023-08-25 MED ORDER — NYSTATIN 100000 UNIT/ML MT SUSP
500000.0000 [IU] | Freq: Four times a day (QID) | OROMUCOSAL | 0 refills | Status: DC
  Filled 2023-08-25: qty 200, 10d supply, fill #0

## 2023-08-26 ENCOUNTER — Other Ambulatory Visit: Payer: Self-pay

## 2023-08-26 LAB — TESTOSTERONE: Testosterone: 697 ng/dL (ref 250–827)

## 2023-08-26 LAB — TESTOSTERONE, FREE: TESTOSTERONE FREE: 77 pg/mL — ABNORMAL HIGH (ref 6.0–73.0)

## 2023-08-27 ENCOUNTER — Encounter: Payer: Self-pay | Admitting: Internal Medicine

## 2023-08-27 ENCOUNTER — Other Ambulatory Visit (HOSPITAL_BASED_OUTPATIENT_CLINIC_OR_DEPARTMENT_OTHER): Payer: Self-pay

## 2023-08-27 ENCOUNTER — Telehealth: Payer: Self-pay | Admitting: Internal Medicine

## 2023-08-27 ENCOUNTER — Other Ambulatory Visit: Payer: Self-pay | Admitting: Family Medicine

## 2023-08-27 MED ORDER — FREESTYLE LITE TEST VI STRP
1.0000 | ORAL_STRIP | Freq: Every day | 3 refills | Status: AC
Start: 2023-08-27 — End: ?
  Filled 2023-08-27: qty 100, 100d supply, fill #0
  Filled 2023-08-31: qty 100, 30d supply, fill #0

## 2023-08-27 NOTE — Telephone Encounter (Signed)
 Dr. Gwenette Lennox referred Reginald Rios to ENT.  Dr. Jolayne Natter wanted to refer him to ENT as well.  So Dr. Jolayne Natter and I spoke and we decided to hold off on our referral and see where the other ENT referral panned out.  [name] had an appointment with Dr. Hildy Lowers on 4/23 at 11:00 am.  He did go to this appointment and was seen.

## 2023-08-31 ENCOUNTER — Ambulatory Visit (INDEPENDENT_AMBULATORY_CARE_PROVIDER_SITE_OTHER): Admitting: Internal Medicine

## 2023-08-31 ENCOUNTER — Other Ambulatory Visit (HOSPITAL_BASED_OUTPATIENT_CLINIC_OR_DEPARTMENT_OTHER): Payer: Self-pay

## 2023-08-31 ENCOUNTER — Other Ambulatory Visit: Payer: Self-pay

## 2023-08-31 ENCOUNTER — Other Ambulatory Visit: Payer: Self-pay | Admitting: Family Medicine

## 2023-08-31 ENCOUNTER — Encounter: Payer: Self-pay | Admitting: Internal Medicine

## 2023-08-31 VITALS — BP 106/58 | HR 86 | Temp 99.1°F | Resp 18

## 2023-08-31 DIAGNOSIS — J3089 Other allergic rhinitis: Secondary | ICD-10-CM | POA: Diagnosis not present

## 2023-08-31 DIAGNOSIS — K219 Gastro-esophageal reflux disease without esophagitis: Secondary | ICD-10-CM

## 2023-08-31 DIAGNOSIS — J4530 Mild persistent asthma, uncomplicated: Secondary | ICD-10-CM

## 2023-08-31 DIAGNOSIS — B370 Candidal stomatitis: Secondary | ICD-10-CM | POA: Diagnosis not present

## 2023-08-31 DIAGNOSIS — J45990 Exercise induced bronchospasm: Secondary | ICD-10-CM

## 2023-08-31 MED ORDER — MONTELUKAST SODIUM 10 MG PO TABS
10.0000 mg | ORAL_TABLET | Freq: Every day | ORAL | 3 refills | Status: AC
  Filled 2023-08-31: qty 90, 90d supply, fill #0
  Filled 2023-12-06: qty 90, 90d supply, fill #1
  Filled 2024-03-13: qty 90, 90d supply, fill #2

## 2023-08-31 NOTE — Patient Instructions (Signed)
 Chronic Cough: intermittent asthma, with thrush  STOP flovent , will will try to manage this with albuterol   Continue albuterol  HFA, 2 to 4 inhalations every 4-6 hours if needed.   May use albuterol  2 puffs 5 to 15 minutes before  physical activity  Asthma control goals:  Full participation in all desired activities (may need albuterol  before activity) Albuterol  use two time or less a week on average (not counting use with activity) Cough interfering with sleep two time or less a month Oral steroids no more than once a year No hospitalizations    Allergic rhinitis with a nonallergic (irritant) component At your previous visit, Perennial and seasonal epicutaneous tests were negative despite a positive histamine control.  Intradermal tests were mildly reactive to grass pollen, major mold mix #4, and cockroach antigen. Continue allergen avoidance to the above as well as to irritants (perfumes, strong smells, chemical cleaners, etc) Continue azelastine  nasal spray, 1 spray per nostril twice daily as needed for congestion.  Nasal saline spray (i.e., Simply Saline, Ocean Mist) or nasal saline lavage (i.e., NeilMed-with distilled water only) is recommended as needed and prior to medicated nasal sprays.   Acid reflux Appropriate reflux lifestyle modifications have been provided. Continue Pantoprazole  (protonix ) 40 mg nightly.  May take Tums as needed.  Follow  up: 3-6 months

## 2023-08-31 NOTE — Progress Notes (Signed)
 FOLLOW UP Date of Service/Encounter:  09/02/23  Subjective:  Reginald Rios (DOB: 08/14/1955) is a 68 y.o. male who [facility] in re-evaluation of the following: Asthma, rhinitis, thrush, GERD History obtained from: chart review and patient.  For Review, LV was on 07/19/2023 with Tinnie Forehand, FNP seen for acute visit for worsening cough . See below for summary of history and diagnostics.   Today presents for follow-up. Discussed the use of AI scribe software for clinical note transcription with the patient, who gave verbal consent to proceed.  History of Present Illness Reginald Rios is a 68 year old male with asthma who presents with worsening asthma symptoms and oral thrush.  He has experienced worsening asthma symptoms, including shortness of breath and a tickling sensation in his throat, exacerbated since discontinuing Flovent  two days ago. His breathing was better on Flovent , but it caused hoarseness and oral thrush. He uses albuterol  as needed, which temporarily alleviates symptoms. He experiences shortness of breath during walks with his dog, a change from his previous ability to play pickleball for three hours.  He has been using nystatin  swish and swallow for ten days to treat oral thrush, which has improved his symptoms. However, he still experiences a 'phlegmy' sensation in his throat and occasional loss of voice. He was scoped and small spots of fungus were observed, confirming oral candidiasis.  He has a history of exercise-induced asthma diagnosed at [facility] and uses a spacer and rinses his mouth after inhaler use to mitigate side effects. He currently uses albuterol  before walks to prevent exercise-induced symptoms.  He is concerned about environmental factors worsening his asthma, noting that he has been advised to stay indoors during poor air quality events, such as forest fires.     All medications reviewed by clinical  staff and updated in chart. No new pertinent medical or surgical history except as noted in HPI.  ROS: All others negative except as noted per HPI.   Objective:  BP (!) 106/58   Pulse 86   Temp 99.1 F (37.3 C) (Temporal)   Resp 18   SpO2 98%  There is no height or weight on file to calculate BMI. Physical Exam: General Appearance:  Alert, cooperative, no distress, appears stated age  Head:  Normocephalic, without obvious abnormality, atraumatic  Eyes:  Conjunctiva clear, EOM's intact  Ears EACs normal bilaterally  Nose: Nares normal, normal mucosa, no visible anterior polyps, and septum midline  Throat: Lips, tongue normal; teeth and gums normal, normal posterior oropharynx  Neck: Supple, symmetrical  Lungs:   clear to auscultation bilaterally, Respirations unlabored, no coughing  Heart:  regular rate and rhythm and no murmur, Appears well perfused  Extremities: No edema  Skin: Skin color, texture, turgor normal and no rashes or lesions on visualized portions of skin  Neurologic: No gross deficits   Labs:  Lab Orders  No laboratory test(s) ordered today    Spirometry:  Tracings reviewed. His effort: Good reproducible efforts. FVC: 2.71 L FEV1: 2.33 L, 73% predicted FEV1/FVC ratio: 86% Interpretation: Nonobstructive ratio, low FEV1, possible restriction.  At baseline Please see scanned spirometry results for details.    Assessment/Plan     Chronic Cough: intermittent asthma, with thrush  STOP flovent , will will try to manage this with albuterol   Continue albuterol  HFA, 2 to 4 inhalations every 4-6 hours if needed.   May use albuterol  2 puffs 5 to 15 minutes before  physical activity  Asthma control goals:  Full participation in all desired activities (may need albuterol  before activity) Albuterol  use two time or less a week on average (not counting use with activity) Cough interfering with sleep two time or less a month Oral steroids no more than once a year No  hospitalizations    Allergic rhinitis with a nonallergic (irritant) component At your previous visit, Perennial and seasonal epicutaneous tests were negative despite a positive histamine control.  Intradermal tests were mildly reactive to grass pollen, major mold mix #4, and cockroach antigen. Continue allergen avoidance to the above as well as to irritants (perfumes, strong smells, chemical cleaners, etc) Continue azelastine  nasal spray, 1 spray per nostril twice daily as needed for congestion.  Nasal saline spray (i.e., Simply Saline, Ocean Mist) or nasal saline lavage (i.e., NeilMed-with distilled water only) is recommended as needed and prior to medicated nasal sprays.   Acid reflux Appropriate reflux lifestyle modifications have been provided. Continue Pantoprazole  (protonix ) 40 mg nightly.  May take Tums as needed.  Follow  up: 3-6 months     Other:     Thank you so much for letting me partake in your care today.  Don't hesitate to reach out if you have any additional concerns!  Orelia Binet, MD  [facility] [place], [place]

## 2023-09-01 ENCOUNTER — Other Ambulatory Visit (HOSPITAL_BASED_OUTPATIENT_CLINIC_OR_DEPARTMENT_OTHER): Payer: Self-pay

## 2023-09-02 ENCOUNTER — Other Ambulatory Visit (HOSPITAL_BASED_OUTPATIENT_CLINIC_OR_DEPARTMENT_OTHER): Payer: Self-pay

## 2023-09-02 MED ORDER — ALBUTEROL SULFATE HFA 108 (90 BASE) MCG/ACT IN AERS
2.0000 | INHALATION_SPRAY | RESPIRATORY_TRACT | 1 refills | Status: AC | PRN
  Filled 2023-09-02: qty 6.7, 20d supply, fill #0

## 2023-09-03 ENCOUNTER — Other Ambulatory Visit (HOSPITAL_BASED_OUTPATIENT_CLINIC_OR_DEPARTMENT_OTHER): Payer: Self-pay

## 2023-09-06 ENCOUNTER — Other Ambulatory Visit: Payer: Self-pay

## 2023-09-06 ENCOUNTER — Other Ambulatory Visit (HOSPITAL_BASED_OUTPATIENT_CLINIC_OR_DEPARTMENT_OTHER): Payer: Self-pay

## 2023-09-08 ENCOUNTER — Ambulatory Visit: Admitting: Emergency Medicine

## 2023-09-08 DIAGNOSIS — E538 Deficiency of other specified B group vitamins: Secondary | ICD-10-CM | POA: Diagnosis not present

## 2023-09-08 MED ORDER — CYANOCOBALAMIN 1000 MCG/ML IJ SOLN
1000.0000 ug | Freq: Once | INTRAMUSCULAR | Status: AC
Start: 2023-09-08 — End: 2023-09-08
  Administered 2023-09-08: 1000 ug via INTRAMUSCULAR

## 2023-09-08 NOTE — Progress Notes (Signed)
 Patient here for monthly b12 injection per physicians order.  Injection given in left deltoid and patient tolerated well.

## 2023-09-15 DIAGNOSIS — H35363 Drusen (degenerative) of macula, bilateral: Secondary | ICD-10-CM | POA: Diagnosis not present

## 2023-09-15 DIAGNOSIS — H35372 Puckering of macula, left eye: Secondary | ICD-10-CM | POA: Diagnosis not present

## 2023-09-15 DIAGNOSIS — H59811 Chorioretinal scars after surgery for detachment, right eye: Secondary | ICD-10-CM | POA: Diagnosis not present

## 2023-09-15 DIAGNOSIS — H43391 Other vitreous opacities, right eye: Secondary | ICD-10-CM | POA: Diagnosis not present

## 2023-09-23 ENCOUNTER — Ambulatory Visit: Admitting: Family Medicine

## 2023-09-23 ENCOUNTER — Encounter: Payer: Self-pay | Admitting: Family Medicine

## 2023-09-23 ENCOUNTER — Other Ambulatory Visit (HOSPITAL_BASED_OUTPATIENT_CLINIC_OR_DEPARTMENT_OTHER): Payer: Self-pay

## 2023-09-23 ENCOUNTER — Ambulatory Visit: Payer: Self-pay | Admitting: *Deleted

## 2023-09-23 VITALS — BP 100/60 | HR 88 | Temp 98.3°F | Resp 18 | Ht 70.0 in | Wt 195.4 lb

## 2023-09-23 DIAGNOSIS — Z7984 Long term (current) use of oral hypoglycemic drugs: Secondary | ICD-10-CM

## 2023-09-23 DIAGNOSIS — E119 Type 2 diabetes mellitus without complications: Secondary | ICD-10-CM

## 2023-09-23 DIAGNOSIS — L247 Irritant contact dermatitis due to plants, except food: Secondary | ICD-10-CM

## 2023-09-23 MED ORDER — DOXYCYCLINE HYCLATE 100 MG PO TABS
100.0000 mg | ORAL_TABLET | Freq: Two times a day (BID) | ORAL | 0 refills | Status: DC
  Filled 2023-09-23: qty 20, 10d supply, fill #0

## 2023-09-23 MED ORDER — METHYLPREDNISOLONE ACETATE 80 MG/ML IJ SUSP
80.0000 mg | Freq: Once | INTRAMUSCULAR | Status: AC
  Administered 2023-09-23: 80 mg via INTRAMUSCULAR

## 2023-09-23 MED ORDER — PREDNISONE 10 MG PO TABS
ORAL_TABLET | ORAL | 0 refills | Status: DC
  Filled 2023-09-23: qty 20, 12d supply, fill #0

## 2023-09-23 MED ORDER — GLYBURIDE 2.5 MG PO TABS: 2.5000 mg | ORAL_TABLET | Freq: Every day | ORAL | 2 refills | Status: AC

## 2023-09-23 NOTE — Telephone Encounter (Signed)
 Copied from CRM [id number]. Topic: Clinical - Red Word Triage >> Sep 23, 2023  8:04 AM Elle L wrote: Red Word that prompted transfer to Nurse Triage: The patient states he may have poison ivy that is worsening and he is concerned that it is getting infected as he is diabetic. Reason for Disposition  [1] Pimples (localized) AND Domitilla.Distel ] no improvement after using Care Advice  Answer Assessment - Initial Assessment Questions 1. APPEARANCE of RASH: "Describe the rash."      I'm not sure what it is, maybe poison ivy.   I handled some hay so maybe something was in the hay, I don't know. I have little white spots and they are clear at my ankles.  Now spreading to my knee.   I have diabetes so I just to get checked.    I'm getting for surgery on my back so I want to get this cleared up soon. 2. LOCATION: "Where is the rash located?"      Itches only 3. NUMBER: "How many spots are there?"      They are concentrated at my ankles. 4. SIZE: "How big are the spots?" (Inches, centimeters or compare to size of a coin)      Little white spots 5. ONSET: "When did the rash start?"      A week ago 6. ITCHING: "Does the rash itch?" If Yes, ask: "How bad is the itch?"  (Scale 0-10; or none, mild, moderate, severe)     Yes   Calamine lotion helps a little 7. PAIN: "Does the rash hurt?" If Yes, ask: "How bad is the pain?"  (Scale 0-10; or none, mild, moderate, severe)    - NONE (0): no pain    - MILD (1-3): doesn't interfere with normal activities     - MODERATE (4-7): interferes with normal activities or awakens from sleep     - SEVERE (8-10): excruciating pain, unable to do any normal activities     No pain 8. OTHER SYMPTOMS: "Do you have any other symptoms?" (e.g., fever)     No 9. PREGNANCY: "Is there any chance you are pregnant?" "When was your last menstrual period?"     N/A  Protocols used: Rash or Redness - Localized-A-AH  Chief Complaint: Rash on both ankles that is spreading and itching Symptoms:  above Frequency: Started a week ago     Pt has diabetes and is for back surgery soon. Pertinent Negatives: Patient denies burning or OTC lotions helping Disposition: [] ED /[] Urgent Care (no appt availability in office) / [x] Appointment(In office/virtual)/ []  [facility]/ [] Home Care/ [] Refused Recommended Disposition /[] [facility]/ []  Follow-up with PCP Additional Notes: Appt made for today at 3:00 with Dr. Dino Frank.

## 2023-09-23 NOTE — Patient Instructions (Signed)
Poison Ivy Dermatitis Poison ivy dermatitis is irritation and swelling (inflammation) of the skin caused by chemicals in the leaves of the poison ivy plant. The skin reaction often involves redness, blisters, and extreme itching. What are the causes? This condition is caused by a chemical (urushiol) found in the sap of the poison ivy plant. This chemical is sticky and can easily spread to people, animals, and objects. You can get poison ivy dermatitis by: Having direct contact with a poison ivy plant. Touching animals, other people, or objects that have come in contact with poison ivy and have the chemical on them. What increases the risk? This condition is more likely to develop in people who: Are outdoors often in wooded or marshy areas. Go outdoors without wearing protective clothing, such as closed shoes, long pants, and a long-sleeved shirt. What are the signs or symptoms? Symptoms of this condition include: Redness of the skin. Extreme itching. A rash that often includes bumps and blisters. The rash usually appears 48 hours after exposure, if you have been exposed before. If this is the first time you have been exposed, the rash may not appear until a week after exposure. Swelling. This may occur if the reaction is more severe. Symptoms usually last for 1-2 weeks. However, the first time you develop this condition, symptoms may last 3-4 weeks. How is this diagnosed? This condition may be diagnosed based on your symptoms and a physical exam. Your health care provider may also ask you about any recent outdoor activity. How is this treated? Treatment for this condition will vary depending on how severe it is. Treatment may include: Hydrocortisone cream or calamine lotion to relieve itching. Oatmeal baths to soothe the skin. Medicines, such as over-the-counter antihistamine tablets. Oral or injected steroid medicine, for more severe reactions. Follow these instructions at  home: Medicines Take or apply over-the-counter and prescription medicines only as told by your health care provider. Use hydrocortisone cream or calamine lotion as needed to soothe the skin and relieve itching. General instructions Do not scratch or rub your skin. Apply a cold, wet cloth (cold compress) to the affected areas or take baths in cool water. This will help with itching. Avoid hot baths and showers. Take oatmeal baths as needed. Use colloidal oatmeal. You can get this at your local pharmacy or grocery store. Follow the instructions on the packaging. Wash all clothes, bedsheets, towels, and blankets you were in contact with between your exposure and appearance of the rash. Check the affected area every day for signs of infection. Check for: More redness, swelling, or pain. Fluid or blood. Warmth. Pus or a bad smell. Keep all follow-up visits. Your health care provider may want to see how your skin is progressing with treatment. How is this prevented?  Learn to identify the poison ivy plant and avoid contact with the plant. This plant can be recognized by the number of leaves. Generally, poison ivy has three leaves with flowering branches on a single stem. The leaves are typically glossy, and they have jagged edges that come to a point. If you have been exposed to poison ivy, thoroughly wash with soap and water right away. You have about 30 minutes to remove the plant resin before it will cause the rash. Be sure to wash under your fingernails, because any plant resin there will continue to spread the rash. When hiking or camping, wear clothes that will help you to avoid skin exposure. This includes long pants, a long-sleeved shirt, long socks,   and hiking boots. You can also apply preventive lotion to your skin to help limit exposure. If you suspect that your clothes or outdoor gear came in contact with poison ivy, rinse them off outside with a garden hose before you bring them inside  your house. When doing yard work or gardening, wear gloves, long sleeves, long pants, and boots. Wash your garden tools and gloves if they come in contact with poison ivy. If you suspect that your pet has come into contact with poison ivy, wash them with pet shampoo and water. Make sure to wear gloves while washing your pet. Contact a health care provider if: You have open sores in the rash area. You have any signs of infection. You have redness that spreads beyond the rash area. You have a fever. You have a rash over a large area of your body. You have a rash on your eyes, mouth, or genitals. You have a rash that does not improve after a few weeks. Get help right away if: Your face swells or your eyes swell shut. You have trouble breathing. You have trouble swallowing. These symptoms may be an emergency. Get help right away. Call 911. Do not wait to see if the symptoms will go away. Do not drive yourself to the hospital. This information is not intended to replace advice given to you by your health care provider. Make sure you discuss any questions you have with your health care provider. Document Revised: 09/18/2021 Document Reviewed: 09/18/2021 Elsevier Patient Education  2024 [employer]  

## 2023-09-23 NOTE — Progress Notes (Signed)
 Established Patient Office Visit  Subjective   Patient ID: Reginald Rios, male    DOB: Mar 22, 1956  Age: 68 y.o. MRN: [id number]  Chief Complaint  Patient presents with   Rash    Bilateral ankle rash, pt states may be from spreading hay    HPI Discussed the use of AI scribe software for clinical note transcription with the patient, who gave verbal consent to proceed.  History of Present Illness Reginald Rios is a 68 year old male with diabetes who presents with a rash on his legs.  He has had a rash on his legs for about a week, which is extremely itchy and has led to significant scratching. He has been using a cream containing calamine and alcohol, as well as antihistamines, but these have only provided temporary relief. He suspects the rash might be related to poison ivy, possibly transferred from his dog's fur after handling hay.  He manages his diabetes with Actos , metformin , and Jardiance , maintaining blood sugar levels typically below 150 mg/dL. He also has glyburide  at home for use if his blood sugar exceeds 200 mg/dL. He is preparing for back surgery in about a month.  He has pernicious anemia, affecting his B12 absorption, and has undergone testing to rule out certain types of arthritis. He reports itching all over his body, which he attributes to the rash.  His social history includes a wife who is a Therapist, sports and a daughter who assists him. He notes the importance of his wife's presence during his upcoming surgery. He has a history of playing tennis for fifty years, which he believes contributed to his back issues.   Patient Active Problem List   Diagnosis Date Noted   Pernicious anemia    Hallux rigidus, right foot 09/15/2021   Spondylolisthesis at L5-S1 level 05/09/2021   Low back pain at multiple sites 02/05/2021   Osteoarthritis    Heart murmur    Exercise-induced asthma    Diabetes mellitus without complication (HCC)    Arthritis    Anemia     Myofascial pain syndrome, cervical 07/28/2019   Lateral epicondylitis of right elbow 07/28/2019   Nontraumatic incomplete tear of right rotator cuff 07/25/2019   Tendinopathy of right biceps tendon 07/25/2019   Erectile dysfunction 07/11/2019   Mild persistent asthma/cough variant asthma 04/13/2019   Allergic rhinitis with a possible nonallergic component 04/13/2019   Acid reflux 04/13/2019   Chronic cough 04/13/2019   Hoarseness 04/13/2019   Cervical radicular pain 01/10/2019   Ulnar neuropathy at elbow of right upper extremity 01/03/2019   Acute bilateral low back pain without sciatica 01/03/2019   Neck pain 06/01/2018   Occipital headache 06/01/2018   Onychomycosis 05/25/2018   Palpitations 07/06/2017   CAD (coronary artery disease) 07/05/2017   Family history of early CAD 07/05/2017   Abnormal findings on diagnostic imaging of cardiovascular system 06/23/2017   Low vitamin D  level 04/22/2017   Pain in right ankle and joints of right foot 10/05/2016   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   Hemarthrosis of right elbow 02/03/2016   Small thenar eminence 02/03/2016   Cervical disc disorder with radiculopathy of cervical region 10/07/2015   Incomplete rotator cuff tear 09/10/2015   Nonischemic cardiomyopathy (HCC) 05/15/2015   Fatigue 05/15/2015   Dyspnea on exertion 05/15/2015   Decreased cardiac ejection fraction 04/02/2015   Strain of latissimus dorsi muscle 01/21/2015   Shoulder bursitis 10/12/2014   Spinal stenosis, lumbar region, without neurogenic claudication 10/12/2014   BPPV (benign paroxysmal  positional vertigo) 08/14/2014   Hyperlipidemia 01/19/2014   Anemia, unspecified 01/19/2014   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   Type 2 diabetes mellitus with hyperglycemia, without long-term current use of insulin  (HCC) 12/22/2013   Hypogonadism in male 12/22/2013   Arthritis of hand, left 12/22/2013   Past Medical History:  Diagnosis Date    Abnormal findings on diagnostic imaging of cardiovascular system 06/23/2017   Acid reflux 04/13/2019   Acute bilateral low back pain without sciatica 01/03/2019   Allergic rhinitis with a possible nonallergic component 04/13/2019   Anemia    Anemia, unspecified 01/19/2014   Arthritis    Arthritis of hand, left 12/22/2013   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   BPPV (benign paroxysmal positional vertigo) 08/14/2014   CAD (coronary artery disease)    2/19 PCI/DESx1 to Lcx   Cervical disc disorder with radiculopathy of cervical region 10/07/2015   Cervical radicular pain 01/10/2019   Chronic cough 04/13/2019   Decreased cardiac ejection fraction 04/02/2015   Diabetes mellitus without complication (HCC)    Dyspnea on exertion 05/15/2015   Erectile dysfunction 07/11/2019   Exercise-induced asthma    Family history of early CAD 07/05/2017   Fatigue 05/15/2015   Hallux rigidus, right foot 09/15/2021   Heart murmur    Hemarthrosis of right elbow 02/03/2016   Hoarseness 04/13/2019   Hyperlipidemia    family hx of high cholesterol   Hypogonadism in male    Incomplete rotator cuff tear 09/10/2015   Injected 09/10/2015   Lateral epicondylitis of right elbow 07/28/2019   Low back pain at multiple sites 02/05/2021   Low vitamin D  level 04/22/2017   Mild persistent asthma/cough variant asthma 04/13/2019   Myofascial pain syndrome, cervical 07/28/2019   Neck pain 06/01/2018   Nonischemic cardiomyopathy (HCC) 05/15/2015   Nontraumatic incomplete tear of right rotator cuff 07/25/2019   Occipital headache 06/01/2018   Onychomycosis 05/25/2018   Osteoarthritis    Pain in right ankle and joints of right foot 10/05/2016   Palpitations 07/06/2017   Pernicious anemia    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   Shoulder bursitis 10/12/2014   Injected in 10/12/2014  Repeat 01/21/2015   Small thenar eminence 02/03/2016   Spinal stenosis, lumbar region, without neurogenic  claudication 10/12/2014   Spondylolisthesis at L5-S1 level 05/09/2021   Strain of latissimus dorsi muscle 01/21/2015   Tendinopathy of right biceps tendon 07/25/2019   Type 2 diabetes mellitus with hyperglycemia, without long-term current use of insulin  (HCC) 12/22/2013   Ulnar neuropathy at elbow of right upper extremity 01/03/2019   Past Surgical History:  Procedure Laterality Date   CORONARY PRESSURE/FFR STUDY N/A 06/23/2017   Procedure: INTRAVASCULAR PRESSURE WIRE/FFR STUDY;  Surgeon: Arleen Lacer, MD;  [facility];  Service: Cardiovascular;  Laterality: N/A;   CORONARY STENT INTERVENTION N/A 06/23/2017   Procedure: CORONARY STENT INTERVENTION;  Surgeon: Arleen Lacer, MD;  Location: [facility] INVASIVE CV LAB;  Service: Cardiovascular;  Laterality: N/A;   KNEE ARTHROSCOPY     LEFT HEART CATH AND CORONARY ANGIOGRAPHY N/A 06/23/2017   Procedure: LEFT HEART CATH AND CORONARY ANGIOGRAPHY;  Surgeon: Arleen Lacer, MD;  Location: [facility] INVASIVE CV LAB;  Service: Cardiovascular;  Laterality: N/A;   LEFT HEART CATH AND CORONARY ANGIOGRAPHY N/A 07/14/2023   Procedure: LEFT HEART CATH AND CORONARY ANGIOGRAPHY;  Surgeon: Cody Das, MD;  Location: [facility] INVASIVE CV LAB;  Service: Cardiovascular;  Laterality: N/A;   SHOULDER ARTHROSCOPY WITH BICEPSTENOTOMY Right 09/06/2019  Procedure: SHOULDER ARTHROSCOPY WITH BICEPSTENOTOMY;  Surgeon: Wes Hamman, MD;  Location: [facility];  Service: Orthopedics;  Laterality: Right;   SHOULDER ARTHROSCOPY WITH SUBACROMIAL DECOMPRESSION Right 09/06/2019   Procedure: RIGHT SHOULDER ARTHROSCOPY WITH EXTENSIVE DEBRIDEMENT, SUBACROMIAL DECOMPRESSION, BICEPS TENOTOMY;  Surgeon: Wes Hamman, MD;  Location: [facility];  Service: Orthopedics;  Laterality: Right;   TENOTOMY ACHILLES TENDON     Social History   Tobacco Use   Smoking status: Never    Passive exposure: Never   Smokeless tobacco: Never  Vaping Use   Vaping  status: Never Used  Substance Use Topics   Alcohol use: Yes    Alcohol/week: 2.0 standard drinks of alcohol    Types: 2 Glasses of wine per week    Comment: rare   Drug use: No   Social History   Socioeconomic History   Marital status: Married    Spouse name: Not on file   Number of children: Not on file   Years of education: Not on file   Highest education level: Some college, no degree  Occupational History   Occupation: Conservation officer, historic buildings   Tobacco Use   Smoking status: Never    Passive exposure: Never   Smokeless tobacco: Never  Vaping Use   Vaping status: Never Used  Substance and Sexual Activity   Alcohol use: Yes    Alcohol/week: 2.0 standard drinks of alcohol    Types: 2 Glasses of wine per week    Comment: rare   Drug use: No   Sexual activity: Not on file  Other Topics Concern   Not on file  Social History Narrative   Previously worked as Environmental manager   Currently working at [employer]   Married 30 years   2 children ages 58, 34   Wife is psychiatrist - Dr. Alberteen Huge   Moved from [place]   Grew up in [place]   Social Drivers of Health   Financial Resource Strain: Low Risk  (05/07/2023)   Overall Financial Resource Strain (CARDIA)    Difficulty of Paying Living Expenses: Not hard at all  Food Insecurity: No Food Insecurity (05/07/2023)   Hunger Vital Sign    Worried About Running Out of Food in the Last Year: Never true    Ran Out of Food in the Last Year: Never true  Transportation Needs: No Transportation Needs (05/07/2023)   PRAPARE - Administrator, Civil Service (Medical): No    Lack of Transportation (Non-Medical): No  Physical Activity: Sufficiently Active (05/07/2023)   Exercise Vital Sign    Days of Exercise per Week: 7 days    Minutes of Exercise per Session: 30 min  Stress: No Stress Concern Present (05/07/2023)   [employer] of Occupational Health - Occupational Stress Questionnaire    Feeling of Stress : Not at all   Social Connections: Socially Integrated (05/07/2023)   Social Connection and Isolation Panel [NHANES]    Frequency of Communication with Friends and Family: More than three times a week    Frequency of Social Gatherings with Friends and Family: Once a week    Attends Religious Services: 1 to 4 times per year    Active Member of [employer] or Organizations: Yes    Attends Banker Meetings: 1 to 4 times per year    Marital Status: Married  Catering manager Violence: Not on file   Family Status  Relation Name Status   Mother  Deceased at age 39  Father  Deceased at age 75   Brother  Audiological scientist  (Not Specified)   Daughter  Alive   Son  Alive   MGM  Deceased   MGF  Deceased   PGM  Deceased   PGF  Deceased   Pat Aunt  Deceased   Neg Hx  (Not Specified)  No partnership data on file   Family History  Problem Relation Age of Onset   Lung cancer Mother 20   Cancer Mother        lung cancer   Coronary artery disease Father 100   Heart disease Father    Hypothyroidism Brother    Food Allergy Son    Asthma Paternal Aunt    Colon cancer Neg Hx    Prostate cancer Neg Hx    Allergic rhinitis Neg Hx    Angioedema Neg Hx    Eczema Neg Hx    Immunodeficiency Neg Hx    Urticaria Neg Hx    Allergies  Allergen Reactions   Invokana  [Canagliflozin ] Rash   Grass Pollen(K-O-R-T-Swt Vern)    Lisinopril  Cough   Semaglutide  Nausea Only    Causes cramping. Patient is intolerant to Rybelsus       Review of Systems  Constitutional:  Negative for chills, fever and malaise/fatigue.  HENT:  Negative for congestion and hearing loss.   Eyes:  Negative for blurred vision and discharge.  Respiratory:  Negative for cough, sputum production and shortness of breath.   Cardiovascular:  Negative for chest pain, palpitations and leg swelling.  Gastrointestinal:  Negative for abdominal pain, blood in stool, constipation, diarrhea, heartburn, nausea and vomiting.  Genitourinary:  Negative  for dysuria, frequency, hematuria and urgency.  Musculoskeletal:  Negative for back pain, falls and myalgias.  Skin:  Positive for itching and rash.  Neurological:  Negative for dizziness, sensory change, loss of consciousness, weakness and headaches.  Endo/Heme/Allergies:  Negative for environmental allergies. Does not bruise/bleed easily.  Psychiatric/Behavioral:  Negative for depression and suicidal ideas. The patient is not nervous/anxious and does not have insomnia.       Objective:     BP 100/60 (BP Location: Left Arm, Patient Position: Sitting, Cuff Size: Normal)   Pulse 88   Temp 98.3 F (36.8 C) (Oral)   Resp 18   Ht 5\' 10"  (1.778 m)   Wt 195 lb 6.4 oz (88.6 kg)   SpO2 98%   BMI 28.04 kg/m  BP Readings from Last 3 Encounters:  09/23/23 100/60  08/31/23 (!) 106/58  08/10/23 124/80   Wt Readings from Last 3 Encounters:  09/23/23 195 lb 6.4 oz (88.6 kg)  08/17/23 200 lb (90.7 kg)  08/10/23 201 lb (91.2 kg)   SpO2 Readings from Last 3 Encounters:  09/23/23 98%  08/31/23 98%  08/10/23 97%      Physical Exam Vitals and nursing note reviewed.  Constitutional:      General: He is not in acute distress.    Appearance: Normal appearance. He is well-developed.  HENT:     Head: Normocephalic and atraumatic.  Eyes:     General: No scleral icterus.       Right eye: No discharge.        Left eye: No discharge.  Cardiovascular:     Rate and Rhythm: Normal rate and regular rhythm.     Heart sounds: No murmur heard. Pulmonary:     Effort: Pulmonary effort is normal. No respiratory distress.     Breath sounds: Normal  breath sounds.  Musculoskeletal:        General: Normal range of motion.     Cervical back: Normal range of motion and neck supple.     Right lower leg: No edema.     Left lower leg: No edema.  Skin:    General: Skin is warm and dry.     Findings: Rash present.     Comments: Vesicular rash on legs b/L knees and below esp at ankles   Neurological:      Mental Status: He is alert and oriented to person, place, and time.  Psychiatric:        Mood and Affect: Mood normal.        Behavior: Behavior normal.        Thought Content: Thought content normal.        Judgment: Judgment normal.      No results found for any visits on 09/23/23.  Last CBC Lab Results  Component Value Date   WBC 4.6 07/05/2023   HGB 12.2 (L) 07/05/2023   HCT 36.5 (L) 07/05/2023   MCV 100.1 (H) 07/05/2023   MCH 33.1 03/06/2022   RDW 14.6 07/05/2023   PLT 198.0 07/05/2023   Last metabolic panel Lab Results  Component Value Date   GLUCOSE 120 (H) 08/09/2023   NA 142 08/09/2023   K 3.8 08/09/2023   CL 105 08/09/2023   CO2 23 08/09/2023   BUN 16 08/09/2023   CREATININE 0.76 08/09/2023   GFR 91.25 08/09/2023   CALCIUM  9.9 08/09/2023   PROT 7.2 08/09/2023   ALBUMIN 4.6 08/09/2023   LABGLOB 3.0 03/06/2022   AGRATIO 1.3 03/06/2022   BILITOT 0.4 08/09/2023   ALKPHOS 66 08/09/2023   AST 19 08/09/2023   ALT 26 08/09/2023   ANIONGAP 9 03/06/2022   Last lipids Lab Results  Component Value Date   CHOL 114 04/13/2023   HDL 45.00 04/13/2023   LDLCALC 55 04/13/2023   TRIG 67.0 04/13/2023   CHOLHDL 3 04/13/2023   Last hemoglobin A1c Lab Results  Component Value Date   HGBA1C 9.3 (A) 07/08/2023   Last thyroid  functions Lab Results  Component Value Date   TSH 2.63 07/05/2023   Last vitamin D  Lab Results  Component Value Date   VD25OH 28.95 (L) 07/05/2023   Last vitamin B12 and Folate Lab Results  Component Value Date   VITAMINB12 >1537 (H) 08/09/2023   FOLATE >24.0 01/27/2022      The ASCVD Risk score (Arnett DK, et al., 2019) failed to calculate for the following reasons:   The valid total cholesterol range is 130 to 320 mg/dL    Assessment & Plan:   Problem List Items Addressed This Visit       Unprioritized   Diabetes mellitus without complication (HCC)   Relevant Medications   glyBURIDE  (DIABETA ) 2.5 MG tablet   Other  Visit Diagnoses       Irritant contact dermatitis due to plants, except food    -  Primary   Relevant Medications   doxycycline  (VIBRA -TABS) 100 MG tablet   predniSONE  (DELTASONE ) 10 MG tablet   methylPREDNISolone  acetate (DEPO-MEDROL ) injection 80 mg (Completed)     Assessment and Plan Assessment & Plan Contact dermatitis   He presents with contact dermatitis on the legs, likely from poison ivy exposure, possibly through hay or a dog. Symptoms include severe pruritus and erythema, with some excoriation suggesting infection. Previous treatments with calamine lotion and antihistamines offered only temporary relief. Prednisone  is  considered effective but may raise blood glucose levels. He is prepared to adjust his diet and medication accordingly. Administer an intramuscular prednisone  injection in the hip and prescribe oral prednisone  tablets to start the following day. Prescribe an antibiotic for potential infection and refill glyburide  to manage possible blood glucose elevation. Advise washing bed linens, towels, and bathing the dog to remove poison ivy oils. Instruct him to monitor blood glucose levels and adjust his diet as needed. Advise contacting the clinic if symptoms persist or if blood glucose becomes difficult to manage.  Type 2 diabetes mellitus   His type 2 diabetes mellitus is managed with Actos , metformin , and Jardiance , with blood glucose levels generally under 150 mg/dL. Glyburide  is used as needed for elevated levels. There are concerns about potential blood glucose elevation due to prednisone  for contact dermatitis. He is preparing for back surgery in a month and needs to maintain good glycemic control. Refill glyburide  to manage potential elevation due to prednisone . Advise close monitoring of blood glucose, especially after starting prednisone , and discuss the option of sliding scale insulin  if control becomes difficult.  Pernicious anemia   He has pernicious anemia with impaired  B12 absorption. No specific concerns or changes in management were discussed during this visit.  General Health Maintenance   He is preparing for back surgery and has been cleared for the procedure. He is under the care of an endocrinologist for diabetes management and has not had a recent A1c check. Ensure pre-surgical clearance is complete and coordinate with the endocrinologist for diabetes management.    Return if symptoms worsen or fail to improve.    Selenne Coggin R Lowne Chase, DO

## 2023-09-24 ENCOUNTER — Other Ambulatory Visit: Payer: Self-pay | Admitting: Neurological Surgery

## 2023-09-29 ENCOUNTER — Other Ambulatory Visit: Payer: Self-pay | Admitting: Family Medicine

## 2023-09-30 ENCOUNTER — Other Ambulatory Visit (HOSPITAL_BASED_OUTPATIENT_CLINIC_OR_DEPARTMENT_OTHER): Payer: Self-pay

## 2023-09-30 MED ORDER — PIOGLITAZONE HCL 45 MG PO TABS
45.0000 mg | ORAL_TABLET | Freq: Every day | ORAL | 1 refills | Status: DC
  Filled 2023-09-30 – 2023-10-06 (×3): qty 90, 90d supply, fill #0

## 2023-10-04 ENCOUNTER — Other Ambulatory Visit: Payer: Self-pay | Admitting: Family Medicine

## 2023-10-04 DIAGNOSIS — E1165 Type 2 diabetes mellitus with hyperglycemia: Secondary | ICD-10-CM

## 2023-10-04 DIAGNOSIS — E291 Testicular hypofunction: Secondary | ICD-10-CM

## 2023-10-05 ENCOUNTER — Other Ambulatory Visit (HOSPITAL_BASED_OUTPATIENT_CLINIC_OR_DEPARTMENT_OTHER): Payer: Self-pay

## 2023-10-05 ENCOUNTER — Other Ambulatory Visit: Payer: Self-pay

## 2023-10-05 ENCOUNTER — Encounter: Payer: Self-pay | Admitting: Cardiology

## 2023-10-05 ENCOUNTER — Encounter: Payer: Self-pay | Admitting: "Endocrinology

## 2023-10-05 MED ORDER — TESTOSTERONE 50 MG/5GM (1%) TD GEL
2.0000 g | Freq: Every day | TRANSDERMAL | 5 refills | Status: DC
  Filled 2023-10-05 (×2): qty 150, 15d supply, fill #0
  Filled 2023-11-28: qty 300, 30d supply, fill #1
  Filled 2024-01-05: qty 300, 30d supply, fill #2
  Filled 2024-02-28 (×2): qty 300, 30d supply, fill #3

## 2023-10-05 MED ORDER — METFORMIN HCL 1000 MG PO TABS
1000.0000 mg | ORAL_TABLET | Freq: Two times a day (BID) | ORAL | 0 refills | Status: DC
  Filled 2023-10-05: qty 180, 90d supply, fill #0

## 2023-10-06 ENCOUNTER — Other Ambulatory Visit (HOSPITAL_BASED_OUTPATIENT_CLINIC_OR_DEPARTMENT_OTHER): Payer: Self-pay

## 2023-10-07 ENCOUNTER — Telehealth: Payer: Self-pay

## 2023-10-07 ENCOUNTER — Other Ambulatory Visit (HOSPITAL_COMMUNITY): Payer: Self-pay

## 2023-10-07 NOTE — Telephone Encounter (Signed)
 Pharmacy Patient Advocate Encounter   Received notification from CoverMyMeds that prior authorization for Rybelsus  3 is required/requested.   Insurance verification completed.   The patient is insured through [facility] .   Per test claim: The current 30 day co-pay is, $14.99.  No PA needed at this time. This test claim was processed through [facility]- copay amounts may vary at other pharmacies due to pharmacy/plan contracts, or as the patient moves through the different stages of their insurance plan.

## 2023-10-11 ENCOUNTER — Other Ambulatory Visit (HOSPITAL_BASED_OUTPATIENT_CLINIC_OR_DEPARTMENT_OTHER): Payer: Self-pay

## 2023-10-11 ENCOUNTER — Ambulatory Visit: Admitting: "Endocrinology

## 2023-10-11 ENCOUNTER — Encounter: Payer: Self-pay | Admitting: "Endocrinology

## 2023-10-11 VITALS — BP 130/80 | HR 65 | Ht 70.0 in | Wt 197.0 lb

## 2023-10-11 DIAGNOSIS — E1165 Type 2 diabetes mellitus with hyperglycemia: Secondary | ICD-10-CM | POA: Diagnosis not present

## 2023-10-11 DIAGNOSIS — Z7984 Long term (current) use of oral hypoglycemic drugs: Secondary | ICD-10-CM

## 2023-10-11 DIAGNOSIS — E7800 Pure hypercholesterolemia, unspecified: Secondary | ICD-10-CM

## 2023-10-11 DIAGNOSIS — M4317 Spondylolisthesis, lumbosacral region: Secondary | ICD-10-CM | POA: Diagnosis not present

## 2023-10-11 LAB — POCT GLYCOSYLATED HEMOGLOBIN (HGB A1C): Hemoglobin A1C: 7.2 % — AB (ref 4.0–5.6)

## 2023-10-11 MED ORDER — METFORMIN HCL ER 500 MG PO TB24
1000.0000 mg | ORAL_TABLET | Freq: Two times a day (BID) | ORAL | 5 refills | Status: DC
  Filled 2023-10-11 (×2): qty 120, 30d supply, fill #0
  Filled 2023-11-10: qty 120, 30d supply, fill #1
  Filled 2023-12-06: qty 120, 30d supply, fill #2
  Filled 2024-01-03: qty 120, 30d supply, fill #3
  Filled 2024-02-08: qty 120, 30d supply, fill #4
  Filled 2024-03-28: qty 120, 30d supply, fill #5

## 2023-10-11 NOTE — Pre-Procedure Instructions (Addendum)
 Surgical Instructions   Your procedure is scheduled on October 25, 2023. Report to [facility] Main Entrance "A" at 5:30 A.M., then check in with the Admitting office. Any questions or running late day of surgery: call [phone]  Questions prior to your surgery date: call [phone], Monday-Friday, 8am-4pm. If you experience any cold or flu symptoms such as cough, fever, chills, shortness of breath, etc. between now and your scheduled surgery, please notify us  at the above number.     Remember:  Do not eat or drink after midnight the night before your surgery   Take these medicines the morning of surgery with A SIP OF WATER: amLODipine  (NORVASC )  ezetimibe  (ZETIA )  fluticasone  (FLONASE ) nasal spray  montelukast  (SINGULAIR )  pantoprazole  (PROTONIX )  rosuvastatin  (CRESTOR )    May take these medicines IF NEEDED: acetaminophen  (TYLENOL )  albuterol  (VENTOLIN  HFA) inhaler - please bring inhaler with you morning of surgery meclizine  (ANTIVERT )  nitroGLYCERIN  (NITROSTAT ) - if dose taken prior to surgery, please call either of the above phone numbers   Follow your surgeon's instructions on when to stop clopidogrel  (PLAVIX ).  If no instructions were given by your surgeon then you will need to call the office to get those instructions.     One week prior to surgery, STOP taking any Aspirin  (unless otherwise instructed by your surgeon) Aleve, Naproxen, Ibuprofen, Motrin, Advil, Goody's, BC's, all herbal medications, fish oil, and non-prescription vitamins. This includes your medication: diclofenac  Sodium (VOLTAREN ) GEL     WHAT DO I DO ABOUT MY DIABETES MEDICATION?   Do not take glyBURIDE  (DIABETA ) the evening before surgery or the morning of surgery.  Do not take metFORMIN  (GLUCOPHAGE ) or pioglitazone  (ACTOS ) the morning of surgery.     STOP taking your empagliflozin  (JARDIANCE ) three days prior to surgery. Your last dose will be June 19th.   HOW TO MANAGE YOUR DIABETES BEFORE  AND AFTER SURGERY  Why is it important to control my blood sugar before and after surgery? Improving blood sugar levels before and after surgery helps healing and can limit problems. A way of improving blood sugar control is eating a healthy diet by:  Eating less sugar and carbohydrates  Increasing activity/exercise  Talking with your doctor about reaching your blood sugar goals High blood sugars (greater than 180 mg/dL) can raise your risk of infections and slow your recovery, so you will need to focus on controlling your diabetes during the weeks before surgery. Make sure that the doctor who takes care of your diabetes knows about your planned surgery including the date and location.  How do I manage my blood sugar before surgery? Check your blood sugar at least 4 times a day, starting 2 days before surgery, to make sure that the level is not too high or low.  Check your blood sugar the morning of your surgery when you wake up and every 2 hours until you get to the Short Stay unit.  If your blood sugar is less than 70 mg/dL, you will need to treat for low blood sugar: Do not take insulin . Treat a low blood sugar (less than 70 mg/dL) with  cup of clear juice (cranberry or apple), 4 glucose tablets, OR glucose gel. Recheck blood sugar in 15 minutes after treatment (to make sure it is greater than 70 mg/dL). If your blood sugar is not greater than 70 mg/dL on recheck, call [phone] for further instructions. Report your blood sugar to the short stay nurse when you get to Short Stay.  If  you are admitted to the hospital after surgery: Your blood sugar will be checked by the staff and you will probably be given insulin  after surgery (instead of oral diabetes medicines) to make sure you have good blood sugar levels. The goal for blood sugar control after surgery is 80-180 mg/dL.                     Do NOT Smoke (Tobacco/Vaping) for 24 hours prior to your procedure.  If you use a CPAP at  night, you may bring your mask/headgear for your overnight stay.   You will be asked to remove any contacts, glasses, piercing's, hearing aid's, dentures/partials prior to surgery. Please bring cases for these items if needed.    Patients discharged the day of surgery will not be allowed to drive home, and someone needs to stay with them for 24 hours.  SURGICAL WAITING ROOM VISITATION Patients may have no more than 2 support people in the waiting area - these visitors may rotate.   Pre-op nurse will coordinate an appropriate time for 1 ADULT support person, who may not rotate, to accompany patient in pre-op.  Children under the age of 39 must have an adult with them who is not the patient and must remain in the main waiting area with an adult.  If the patient needs to stay at the hospital during part of their recovery, the visitor guidelines for inpatient rooms apply.  Please refer to the [facility] website for the visitor guidelines for any additional information.   If you received a COVID test during your pre-op visit  it is requested that you wear a mask when out in public, stay away from anyone that may not be feeling well and notify your surgeon if you develop symptoms. If you have been in contact with anyone that has tested positive in the last 10 days please notify you surgeon.      Pre-operative 5 CHG Bathing Instructions   You can play a key role in reducing the risk of infection after surgery. Your skin needs to be as free of germs as possible. You can reduce the number of germs on your skin by washing with CHG (chlorhexidine gluconate) soap before surgery. CHG is an antiseptic soap that kills germs and continues to kill germs even after washing.   DO NOT use if you have an allergy to chlorhexidine/CHG or antibacterial soaps. If your skin becomes reddened or irritated, stop using the CHG and notify one of our RNs at [phone].   Please shower with the CHG soap starting 4 days  before surgery using the following schedule:     Please keep in mind the following:  DO NOT shave, including legs and underarms, starting the day of your first shower.   You may shave your face at any point before/day of surgery.  Place clean sheets on your bed the day you start using CHG soap. Use a clean washcloth (not used since being washed) for each shower. DO NOT sleep with pets once you start using the CHG.   CHG Shower Instructions:  Wash your face and private area with normal soap. If you choose to wash your hair, wash first with your normal shampoo.  After you use shampoo/soap, rinse your hair and body thoroughly to remove shampoo/soap residue.  Turn the water OFF and apply about 3 tablespoons (45 ml) of CHG soap to a CLEAN washcloth.  Apply CHG soap ONLY FROM YOUR NECK DOWN TO  YOUR TOES (washing for 3-5 minutes)  DO NOT use CHG soap on face, private areas, open wounds, or sores.  Pay special attention to the area where your surgery is being performed.  If you are having back surgery, having someone wash your back for you may be helpful. Wait 2 minutes after CHG soap is applied, then you may rinse off the CHG soap.  Pat dry with a clean towel  Put on clean clothes/pajamas   If you choose to wear lotion, please use ONLY the CHG-compatible lotions that are listed below.  Additional instructions for the day of surgery: DO NOT APPLY any lotions, deodorants, cologne, or perfumes.   Do not bring valuables to the hospital. [facility] is not responsible for any belongings/valuables. Do not wear nail polish, gel polish, artificial nails, or any other type of covering on natural nails (fingers and toes) Do not wear jewelry or makeup Put on clean/comfortable clothes.  Please brush your teeth.  Ask your nurse before applying any prescription medications to the skin.     CHG Compatible Lotions   Aveeno Moisturizing lotion  Cetaphil Moisturizing Cream  Cetaphil Moisturizing  Lotion  Clairol Herbal Essence Moisturizing Lotion, Dry Skin  Clairol Herbal Essence Moisturizing Lotion, Extra Dry Skin  Clairol Herbal Essence Moisturizing Lotion, Normal Skin  Curel Age Defying Therapeutic Moisturizing Lotion with Alpha Hydroxy  Curel Extreme Care Body Lotion  Curel Soothing Hands Moisturizing Hand Lotion  Curel Therapeutic Moisturizing Cream, Fragrance-Free  Curel Therapeutic Moisturizing Lotion, Fragrance-Free  Curel Therapeutic Moisturizing Lotion, Original Formula  Eucerin Daily Replenishing Lotion  Eucerin Dry Skin Therapy Plus Alpha Hydroxy Crme  Eucerin Dry Skin Therapy Plus Alpha Hydroxy Lotion  Eucerin Original Crme  Eucerin Original Lotion  Eucerin Plus Crme Eucerin Plus Lotion  Eucerin TriLipid Replenishing Lotion  Keri Anti-Bacterial Hand Lotion  Keri Deep Conditioning Original Lotion Dry Skin Formula Softly Scented  Keri Deep Conditioning Original Lotion, Fragrance Free Sensitive Skin Formula  Keri Lotion Fast Absorbing Fragrance Free Sensitive Skin Formula  Keri Lotion Fast Absorbing Softly Scented Dry Skin Formula  Keri Original Lotion  Keri Skin Renewal Lotion Keri Silky Smooth Lotion  Keri Silky Smooth Sensitive Skin Lotion  Nivea Body Creamy Conditioning Oil  Nivea Body Extra Enriched Lotion  Nivea Body Original Lotion  Nivea Body Sheer Moisturizing Lotion Nivea Crme  Nivea Skin Firming Lotion  NutraDerm 30 Skin Lotion  NutraDerm Skin Lotion  NutraDerm Therapeutic Skin Cream  NutraDerm Therapeutic Skin Lotion  ProShield Protective Hand Cream  Provon moisturizing lotion  Please read over the following fact sheets that you were given.

## 2023-10-11 NOTE — Progress Notes (Signed)
 Outpatient Endocrinology Note Reginald Newcomer, MD  10/11/23   Reginald Rios Jan 29, 1956 [id number]  Referring Provider: Jobe Rios* Primary Care Provider: Jobe Mulder, DO Reason for consultation: Subjective   Assessment & Plan  Diagnoses and all orders for this visit:  Uncontrolled type 2 diabetes mellitus with hyperglycemia (HCC) -     POCT glycosylated hemoglobin (Hb A1C)  Long term (current) use of oral hypoglycemic drugs  Pure hypercholesterolemia  Other orders -     metFORMIN  (GLUCOPHAGE -XR) 500 MG 24 hr tablet; Take 2 tablets (1,000 mg total) by mouth 2 (two) times daily with a meal.    Diabetes Type II complicated by hyperglycemia,  Lab Results  Component Value Date   GFR 91.25 08/09/2023   Hba1c goal less than 7, current Hba1c is  Lab Results  Component Value Date   HGBA1C 7.2 (A) 10/11/2023   Will recommend the following: Jardiance  25 mg every day  Metformin  XR 500 mg bid (abdominal cramps on regular metformin ) Actos  45 mg every day  Glyburide  2.5 mg every day PRN (BG >220)  No urine infection No history of heart attack/heart failure/pedal edema/urinary bladder cancer  No known contraindications/side effects to any of above medications  -Last LD and Tg are as follows: Lab Results  Component Value Date   LDLCALC 55 04/13/2023    Lab Results  Component Value Date   TRIG 67.0 04/13/2023   -On rosuvastatin  5 mg every day, ezetimibe  10 mg qd (has cramps with statin before)  -Follow low fat diet and exercise   -Blood pressure goal <140/90 - Microalbumin/creatinine goal is < 30 -Last MA/Cr is as follows: Lab Results  Component Value Date   MICROALBUR 2.3 (H) 08/09/2023   -on ACE/ARB losartan  25 mg qd -diet changes including salt restriction -limit eating outside -counseled BP targets per standards of diabetes care -uncontrolled blood pressure can lead to retinopathy, nephropathy and cardiovascular and  atherosclerotic heart disease  Reviewed and counseled on: -A1C target -Blood sugar targets -Complications of uncontrolled diabetes  -Checking blood sugar before meals and bedtime and bring log next visit -All medications with mechanism of action and side effects -Hypoglycemia management: rule of 15's, Glucagon  Emergency Kit and medical alert ID -low-carb low-fat plate-method diet -At least 20 minutes of physical activity per day -Annual dilated retinal eye exam and foot exam -compliance and follow up needs -follow up as scheduled or earlier if problem gets worse  Call if blood sugar is less than 70 or consistently above 250    Take a 15 gm snack of carbohydrate at bedtime before you go to sleep if your blood sugar is less than 100.    If you are going to fast after midnight for a test or procedure, ask your physician for instructions on how to reduce/decrease your insulin  dose.    Call if blood sugar is less than 70 or consistently above 250  -Treating a low sugar by rule of 15  (15 gms of sugar every 15 min until sugar is more than 70) If you feel your sugar is low, test your sugar to be sure If your sugar is low (less than 70), then take 15 grams of a fast acting Carbohydrate (3-4 glucose tablets or glucose gel or 4 ounces of juice or regular soda) Recheck your sugar 15 min after treating low to make sure it is more than 70 If sugar is still less than 70, treat again with 15 grams of carbohydrate  Don't drive the hour of hypoglycemia  If unconscious/unable to eat or drink by mouth, use glucagon  injection or nasal spray baqsimi  and call 911. Can repeat again in 15 min if still unconscious.  Return in about 3 months (around 01/11/2024).   I have reviewed current medications, nurse's notes, allergies, vital signs, past medical and surgical history, family medical history, and social history for this encounter. Counseled patient on symptoms, examination findings, lab findings,  imaging results, treatment decisions and monitoring and prognosis. The patient understood the recommendations and agrees with the treatment plan. All questions regarding treatment plan were fully answered.  Reginald Newcomer, MD  10/11/23    History of Present Illness Reginald Rios is a 68 y.o. year old male who presents for evaluation of Type II diabetes mellitus.   Reginald Rios was first diagnosed around 2015.  Diabetes education +  Will recommend the following: Jardiance  25 mg every day  Metformin  1000 mg bid Actos  45 mg every day  Glyburide  2.5 mg every day PRN (BG >220)  COMPLICATIONS -  MI/Stroke -  retinopathy -  neuropathy -  nephropathy  BLOOD SUGAR DATA 104-228, checks tidac   Physical Exam  BP 130/80   Pulse 65   Ht 5\' 10"  (1.778 m)   Wt 197 lb (89.4 kg)   SpO2 98%   BMI 28.27 kg/m    Constitutional: well developed, well nourished Head: normocephalic, atraumatic Eyes: sclera anicteric, no redness Neck: supple Lungs: normal respiratory effort Neurology: alert and oriented Skin: dry, no appreciable rashes Musculoskeletal: no appreciable defects Psychiatric: normal mood and affect Diabetic Foot Exam - Simple   No data filed      Current Medications Patient's Medications  New Prescriptions   METFORMIN  (GLUCOPHAGE -XR) 500 MG 24 HR TABLET    Take 2 tablets (1,000 mg total) by mouth 2 (two) times daily with a meal.  Previous Medications   ACETAMINOPHEN  (TYLENOL ) 500 MG TABLET    Take 1,000 mg by mouth every 6 (six) hours as needed for moderate pain (pain score 4-6).   ALBUTEROL  (VENTOLIN  HFA) 108 (90 BASE) MCG/ACT INHALER    Inhale 2 puffs into the lungs every 4 (four) to 6 (six) hours as needed for coughing or wheezing spells.   AMLODIPINE  (NORVASC ) 2.5 MG TABLET    Take 1 tablet (2.5 mg total) by mouth daily.   AZELASTINE  (ASTELIN ) 0.1 % NASAL SPRAY    Place 2 sprays into both nostrils 2 (two) times daily as needed for rhinitis (nasal congestion).    BLOOD GLUCOSE MONITORING SUPPL (FREESTYLE FREEDOM LITE) W/DEVICE KIT    Use to check blood sugar daily.   CLOPIDOGREL  (PLAVIX ) 75 MG TABLET    Take 1 tablet (75 mg total) by mouth daily.   DICLOFENAC  SODIUM (VOLTAREN ) 1 % GEL    Apply 2 g topically 4 (four) times daily.   EMPAGLIFLOZIN  (JARDIANCE ) 25 MG TABS TABLET    Take 1 tablet (25 mg total) by mouth daily before breakfast.   EZETIMIBE  (ZETIA ) 10 MG TABLET    Take 1 tablet (10 mg total) by mouth daily.   FLUTICASONE  (FLONASE ) 50 MCG/ACT NASAL SPRAY    Place 2 sprays into both nostrils daily.   GLUCAGON  (GVOKE HYPOPEN  1-PACK) 1 MG/0.2ML SOAJ    Inject into the skin in the event of low sugar.   GLUCOSE BLOOD (FREESTYLE LITE) TEST STRIP    Use daily to check blood sugar.   GLUCOSE BLOOD (FREESTYLE LITE) TEST STRIP    Use  daily to check blood sugar.   GLYBURIDE  (DIABETA ) 2.5 MG TABLET    Take 1 tablet (2.5 mg total) by mouth daily with breakfast.   LANCETS (FREESTYLE) LANCETS    Use daily to check blood sugar   LOSARTAN  (COZAAR ) 25 MG TABLET    Take 1 tablet (25 mg total) by mouth daily.   MECLIZINE  (ANTIVERT ) 25 MG TABLET    Take 1 tablet (25 mg total) by mouth 3 (three) times daily as needed for dizziness.   METHYLPHENIDATE  (RITALIN ) 20 MG TABLET    Take 1 tablet (20 mg total) by mouth 2 (two) times daily.   METHYLPHENIDATE  (RITALIN ) 20 MG TABLET    Take 1 tablet (20 mg total) by mouth 2 (two) times daily.   METHYLPHENIDATE  (RITALIN ) 20 MG TABLET    Take 1 tablet (20 mg total) by mouth 2 (two) times daily.   MONTELUKAST  (SINGULAIR ) 10 MG TABLET    Take 1 tablet (10 mg total) by mouth daily.   MULTIPLE VITAMIN (MULTIVITAMIN WITH MINERALS) TABS TABLET    Take 1 tablet by mouth in the morning. One A Day   NITROGLYCERIN  (NITROSTAT ) 0.4 MG SL TABLET    Place 0.4 mg under the tongue every 5 (five) minutes x 3 doses as needed for chest pain.   PANTOPRAZOLE  (PROTONIX ) 40 MG TABLET    Take 1 tablet (40 mg total) by mouth daily.   PIOGLITAZONE  (ACTOS )  45 MG TABLET    Take 1 tablet (45 mg total) by mouth daily.   PROBIOTIC PRODUCT (PROBIOTIC PO)    Take 2 capsules by mouth in the morning.   ROSUVASTATIN  (CRESTOR ) 5 MG TABLET    Take 1 tablet (5 mg total) by mouth daily.   SILDENAFIL  (VIAGRA ) 100 MG TABLET    Take 1 tablet (100 mg total) by mouth daily as needed. Take 1 hour prior to sexual activity   TESTOSTERONE  (ANDROGEL ) 50 MG/5GM (1%) GEL    Apply 2 applications onto the skin daily.   VITAMIN D , ERGOCALCIFEROL , (DRISDOL ) 1.25 MG (50000 UNIT) CAPS CAPSULE    Take 1 capsule (50,000 Units total) by mouth every 7 (seven) days.  Modified Medications   No medications on file  Discontinued Medications   METFORMIN  (GLUCOPHAGE ) 1000 MG TABLET    Take 1 tablet (1,000 mg total) by mouth 2 (two) times daily with a meal.    Allergies Allergies  Allergen Reactions   Invokana  [Canagliflozin ] Rash   Grass Pollen(K-O-R-T-Swt Vern)    Lisinopril  Cough   Semaglutide  Nausea Only    Causes cramping. 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   BPPV (benign paroxysmal positional vertigo) 08/14/2014   CAD (coronary artery disease)    2/19 PCI/DESx1 to Lcx   Cervical disc disorder with radiculopathy of cervical region 10/07/2015   Cervical radicular pain 01/10/2019   Chronic cough 04/13/2019   Decreased cardiac ejection fraction 04/02/2015   Diabetes mellitus without complication (HCC)    Dyspnea on exertion 05/15/2015   Erectile dysfunction 07/11/2019   Exercise-induced asthma    Family history  of  early CAD 07/05/2017   Fatigue 05/15/2015   Hallux rigidus, right foot 09/15/2021   Heart murmur    Hemarthrosis of right elbow 02/03/2016   Hoarseness 04/13/2019   Hyperlipidemia    family hx of high cholesterol   Hypogonadism in male    Incomplete rotator cuff tear 09/10/2015   Injected 09/10/2015   Lateral epicondylitis of right elbow 07/28/2019   Low back pain at multiple sites 02/05/2021   Low vitamin D  level 04/22/2017   Mild persistent asthma/cough variant asthma 04/13/2019   Myofascial pain syndrome, cervical 07/28/2019   Neck pain 06/01/2018   Nonischemic cardiomyopathy (HCC) 05/15/2015   Nontraumatic incomplete tear of right rotator cuff 07/25/2019   Occipital headache 06/01/2018   Onychomycosis 05/25/2018   Osteoarthritis    Pain in right ankle and joints of right foot 10/05/2016   Palpitations 07/06/2017   Pernicious anemia    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   Shoulder bursitis 10/12/2014   Injected in 10/12/2014  Repeat 01/21/2015   Small thenar eminence 02/03/2016   Spinal stenosis, lumbar region, without neurogenic claudication 10/12/2014   Spondylolisthesis at L5-S1 level 05/09/2021   Strain of latissimus dorsi muscle 01/21/2015   Tendinopathy of right biceps tendon 07/25/2019   Type 2 diabetes mellitus with hyperglycemia, without long-term current use of insulin  (HCC) 12/22/2013   Ulnar neuropathy at elbow of right upper extremity 01/03/2019    Past Surgical History Past Surgical History:  Procedure Laterality Date   CORONARY PRESSURE/FFR STUDY N/A 06/23/2017   Procedure: INTRAVASCULAR PRESSURE WIRE/FFR STUDY;  Surgeon: Arleen Lacer, MD;  [facility]AS[facility] CV LAB;  Service: Cardiovascular;  Laterality: N/A;   CORONARY STENT INTERVENTION N/A 06/23/2017   Procedure: CORONARY STENT INTERVENTION;  Surgeon: Arleen Lacer, MD;  Location: [facility] INVASIVE CV LAB;  Service: Cardiovascular;  Laterality: N/A;   KNEE ARTHROSCOPY     LEFT HEART CATH AND CORONARY  ANGIOGRAPHY N/A 06/23/2017   Procedure: LEFT HEART CATH AND CORONARY ANGIOGRAPHY;  Surgeon: Arleen Lacer, MD;  Location: [facility] INVASIVE CV LAB;  Service: Cardiovascular;  Laterality: N/A;   LEFT HEART CATH AND CORONARY ANGIOGRAPHY N/A 07/14/2023   Procedure: LEFT HEART CATH AND CORONARY ANGIOGRAPHY;  Surgeon: Cody Das, MD;  Location: [facility] INVASIVE CV LAB;  Service: Cardiovascular;  Laterality: N/A;   SHOULDER ARTHROSCOPY WITH BICEPSTENOTOMY Right 09/06/2019   Procedure: SHOULDER ARTHROSCOPY WITH BICEPSTENOTOMY;  Surgeon: Wes Hamman, MD;  Location: [facility];  Service: Orthopedics;  Laterality: Right;   SHOULDER ARTHROSCOPY WITH SUBACROMIAL DECOMPRESSION Right 09/06/2019   Procedure: RIGHT SHOULDER ARTHROSCOPY WITH EXTENSIVE DEBRIDEMENT, SUBACROMIAL DECOMPRESSION, BICEPS TENOTOMY;  Surgeon: Wes Hamman, MD;  Location: [facility];  Service: Orthopedics;  Laterality: Right;   TENOTOMY ACHILLES TENDON      Family History family history includes Asthma in his paternal aunt; Cancer in his mother; Coronary artery disease (age of onset: 24) in his father; Food Allergy in his son; Heart disease in his father; Hypothyroidism in his brother; Lung cancer (age of onset: 51) in his mother.  Social History Social History   Socioeconomic History   Marital status: Married    Spouse name: Not on file   Number of children: Not on file   Years of education: Not on file   Highest education level: Some college, no degree  Occupational History   Occupation: Conservation officer, historic buildings   Tobacco Use   Smoking status: Never    Passive exposure: Never   Smokeless tobacco: Never  Vaping Use   Vaping status: Never Used  Substance and Sexual Activity   Alcohol use: Yes    Alcohol/week: 2.0 standard drinks of alcohol    Types: 2 Glasses of wine per week    Comment: rare   Drug use: No   Sexual activity: Not on file  Other Topics Concern   Not on file  Social History Narrative    Previously worked as Environmental manager   Currently working at [employer]   Married 30 years   2 children ages 24, 63   Wife is psychiatrist - Dr. Alberteen Huge   Moved from [place]   Grew up in [place]   Social Drivers of Health   Financial Resource Strain: Low Risk  (05/07/2023)   Overall Financial Resource Strain (CARDIA)    Difficulty of Paying Living Expenses: Not hard at all  Food Insecurity: No Food Insecurity (05/07/2023)   Hunger Vital Sign    Worried About Running Out of Food in the Last Year: Never true    Ran Out of Food in the Last Year: Never true  Transportation Needs: No Transportation Needs (05/07/2023)   PRAPARE - Administrator, Civil Service (Medical): No    Lack of Transportation (Non-Medical): No  Physical Activity: Sufficiently Active (05/07/2023)   Exercise Vital Sign    Days of Exercise per Week: 7 days    Minutes of Exercise per Session: 30 min  Stress: No Stress Concern Present (05/07/2023)   [employer] of Occupational Health - Occupational Stress Questionnaire    Feeling of Stress : Not at all  Social Connections: Socially Integrated (05/07/2023)   Social Connection and Isolation Panel [NHANES]    Frequency of Communication with Friends and Family: More than three times a week    Frequency of Social Gatherings with Friends and Family: Once a week    Attends Religious Services: 1 to 4 times per year    Active Member of Clubs or Organizations: Yes    Attends Banker Meetings: 1 to 4 times per year    Marital Status: Married  Catering manager Violence: Not on file    Lab Results  Component Value Date   HGBA1C 7.2 (A) 10/11/2023   HGBA1C 9.3 (A) 07/08/2023   HGBA1C 8.0 (H) 01/26/2023   Lab Results  Component Value Date   CHOL 114 04/13/2023   Lab Results  Component Value Date   HDL 45.00 04/13/2023   Lab Results  Component Value Date   LDLCALC 55 04/13/2023   Lab Results  Component Value Date   TRIG 67.0 04/13/2023    Lab Results  Component Value Date   CHOLHDL 3 04/13/2023   Lab Results  Component Value Date   CREATININE 0.76 08/09/2023   Lab Results  Component Value Date   GFR 91.25 08/09/2023   Lab Results  Component Value Date   MICROALBUR 2.3 (H) 08/09/2023      Component Value Date/Time   NA 142 08/09/2023 1428   NA 139 04/22/2022 1439   K 3.8 08/09/2023 1428   CL 105 08/09/2023 1428   CO2 23 08/09/2023 1428   GLUCOSE 120 (H) 08/09/2023 1428   BUN 16 08/09/2023 1428   BUN 12 04/22/2022 1439   CREATININE 0.76 08/09/2023 1428   CREATININE 0.82 07/05/2023 1008   CALCIUM  9.9 08/09/2023 1428   PROT 7.2 08/09/2023 1428   PROT 6.8 04/22/2022 1439   ALBUMIN 4.6 08/09/2023 1428   ALBUMIN 4.6 04/22/2022 1439  AST 19 08/09/2023 1428   AST 19 03/06/2022 1331   ALT 26 08/09/2023 1428   ALT 20 03/06/2022 1331   ALKPHOS 66 08/09/2023 1428   BILITOT 0.4 08/09/2023 1428   BILITOT 0.3 04/22/2022 1439   BILITOT 0.4 03/06/2022 1331   GFRNONAA >60 03/06/2022 1331   GFRAA >60 08/30/2019 1300      Latest Ref Rng & Units 08/09/2023    2:28 PM 07/05/2023    4:14 PM 07/05/2023   10:08 AM  BMP  Glucose 70 - 99 mg/dL 161  096  045   BUN 6 - 23 mg/dL 16  20  17    Creatinine 0.40 - 1.50 mg/dL 4.09  8.11  9.14   BUN/Creat Ratio 6 - 22 (calc)   SEE NOTE:   Sodium 135 - 145 mEq/L 142  137  139   Potassium 3.5 - 5.1 mEq/L 3.8  4.2  4.0   Chloride 96 - 112 mEq/L 105  103  104   CO2 19 - 32 mEq/L 23  27  28    Calcium  8.4 - 10.5 mg/dL 9.9  9.3  9.2        Component Value Date/Time   WBC 4.6 07/05/2023 1614   RBC 3.65 (L) 07/05/2023 1614   HGB 12.2 (L) 07/05/2023 1614   HGB 12.5 (L) 03/06/2022 1331   HGB 13.1 06/11/2017 1302   HCT 36.5 (L) 07/05/2023 1614   HCT 38.8 06/11/2017 1302   PLT 198.0 07/05/2023 1614   PLT 227 03/06/2022 1331   PLT 235 06/11/2017 1302   MCV 100.1 (H) 07/05/2023 1614   MCV 96 06/11/2017 1302   MCH 33.1 03/06/2022 1331   MCHC 33.4 07/05/2023 1614   RDW 14.6  07/05/2023 1614   RDW 14.7 06/11/2017 1302   LYMPHSABS 1.5 07/05/2023 1614   LYMPHSABS 1.9 04/21/2017 1019   MONOABS 0.4 07/05/2023 1614   EOSABS 0.1 07/05/2023 1614   EOSABS 0.1 04/21/2017 1019   BASOSABS 0.1 07/05/2023 1614   BASOSABS 0.0 04/21/2017 1019     Parts of this note may have been dictated using voice recognition software. There may be variances in spelling and vocabulary which are unintentional. Not all errors are proofread. Please notify the [facility] if any discrepancies are noted or if the meaning of any statement is not clear.

## 2023-10-12 ENCOUNTER — Telehealth: Payer: Self-pay

## 2023-10-12 ENCOUNTER — Ambulatory Visit (INDEPENDENT_AMBULATORY_CARE_PROVIDER_SITE_OTHER): Admitting: Neurology

## 2023-10-12 ENCOUNTER — Other Ambulatory Visit: Payer: Self-pay

## 2023-10-12 ENCOUNTER — Encounter (HOSPITAL_COMMUNITY): Payer: Self-pay

## 2023-10-12 ENCOUNTER — Encounter (HOSPITAL_COMMUNITY)
Admission: RE | Admit: 2023-10-12 | Discharge: 2023-10-12 | Disposition: A | Discharge status: Home / Self Care | Source: Ambulatory Visit | Attending: Neurological Surgery | Admitting: Neurological Surgery

## 2023-10-12 ENCOUNTER — Other Ambulatory Visit (HOSPITAL_COMMUNITY): Payer: Self-pay

## 2023-10-12 VITALS — BP 133/65 | HR 92 | Temp 98.4°F | Resp 17 | Ht 70.0 in | Wt 195.0 lb

## 2023-10-12 DIAGNOSIS — Z01818 Encounter for other preprocedural examination: Secondary | ICD-10-CM

## 2023-10-12 DIAGNOSIS — E119 Type 2 diabetes mellitus without complications: Secondary | ICD-10-CM | POA: Insufficient documentation

## 2023-10-12 DIAGNOSIS — E538 Deficiency of other specified B group vitamins: Secondary | ICD-10-CM | POA: Diagnosis not present

## 2023-10-12 DIAGNOSIS — Z01812 Encounter for preprocedural laboratory examination: Secondary | ICD-10-CM | POA: Diagnosis present

## 2023-10-12 HISTORY — DX: Other specified postprocedural states: Z98.890

## 2023-10-12 HISTORY — DX: Nausea with vomiting, unspecified: R11.2

## 2023-10-12 HISTORY — DX: Essential (primary) hypertension: I10

## 2023-10-12 LAB — BASIC METABOLIC PANEL WITH GFR
Anion gap: 12 (ref 5–15)
BUN: 16 mg/dL (ref 8–23)
CO2: 24 mmol/L (ref 22–32)
Calcium: 9.6 mg/dL (ref 8.9–10.3)
Chloride: 106 mmol/L (ref 98–111)
Creatinine, Ser: 0.85 mg/dL (ref 0.61–1.24)
GFR, Estimated: >60 mL/min (ref >60)
Glucose, Bld: 155 mg/dL — ABNORMAL HIGH (ref 70–99)
Potassium: 3.9 mmol/L (ref 3.5–5.1)
Sodium: 142 mmol/L (ref 135–145)

## 2023-10-12 LAB — CBC
HCT: 39.9 % (ref 39.0–52.0)
Hemoglobin: 12.8 g/dL — ABNORMAL LOW (ref 13.0–17.0)
MCH: 32.6 pg (ref 26.0–34.0)
MCHC: 32.1 g/dL (ref 30.0–36.0)
MCV: 101.5 fL — ABNORMAL HIGH (ref 80.0–100.0)
Platelets: 211 10*3/uL (ref 150–400)
RBC: 3.93 MIL/uL — ABNORMAL LOW (ref 4.22–5.81)
RDW: 14.3 % (ref 11.5–15.5)
WBC: 5.5 10*3/uL (ref 4.0–10.5)
nRBC: 0 % (ref 0.0–0.2)

## 2023-10-12 LAB — SURGICAL PCR SCREEN
MRSA, PCR: NEGATIVE
Staphylococcus aureus: NEGATIVE

## 2023-10-12 LAB — GLUCOSE, CAPILLARY: Glucose-Capillary: 163 mg/dL — ABNORMAL HIGH (ref 70–99)

## 2023-10-12 MED ORDER — CYANOCOBALAMIN 1000 MCG/ML IJ SOLN
1000.0000 ug | Freq: Once | INTRAMUSCULAR | Status: AC
  Administered 2023-10-12: 1000 ug via INTRAMUSCULAR

## 2023-10-12 NOTE — Telephone Encounter (Signed)
   Pre-operative Risk Assessment    Patient Name: Reginald Rios  DOB: July 23, 1955 MRN: [id number]   Date of last office visit: 07/13/23 Date of next office visit: 11/15/23   Request for Surgical Clearance    Procedure:  Lumbar fusion  Date of Surgery:  Clearance 10/25/23                                 Surgeon:  Dr. Elna Haggis  Surgeon's Group or Practice Name:  [facility]  Phone number:  [phone] Fax number:  [phone]   Type of Clearance Requested:   - Medical  - Pharmacy:  Hold Clopidogrel  (Plavix ) Not indicated    Type of Anesthesia:  General    Additional requests/questions:    Reginald Rios   10/12/2023, 2:07 PM

## 2023-10-12 NOTE — Telephone Encounter (Signed)
 Spoke with the patient to schedule preop clearance appt, pt states he prefers to see a provider in [place] preferably Dr. Ronell Coe. Patient was offered slots in [place] at [facility] and states he feels uncomfortable with seeing a different provider. Can you all reach out to the patient with any available slots prior to his procedure on 6/23? Informed patient that there was no guarantee there may be slots open prior to procedure date.

## 2023-10-12 NOTE — Progress Notes (Signed)
 Patient is here for a vitamin B12 injection per orders from Dr. Gwenette Lennox:   Jobe Mulder, DO 08/12/2023  1:21 PM EDT     Noted. MyChart message sent to patient.   PM- plz set pt up for monthly B12 injections. Ty.   Last injection: 09/08/2023. Denies gastrointestinal problems or dizziness.  B12 injection to left deltoid with no apparent complications.  Schedule next injection in one month: instructed to schedule.

## 2023-10-12 NOTE — Progress Notes (Addendum)
 PCP - Dr. Voncile Gu [facility] Cardiologist - Dr. Hillis Lu - last office visit Aug 2024,  but was recently seen for SOB on 07/05/2023. Work-up normal  PPM/ICD - Denies Device Orders - n/a Rep Notified - n/a  Chest x-ray - 07/05/2023 EKG - 07/05/2023 Stress Test - 07/18/2019 ECHO - 07/13/2023 Cardiac Cath - 07/14/2023  Sleep Study - Denies CPAP - n/a  Pt is DM2. He checks his blood sugar 3-4x/day. Normal fasting range 100-150s. CBG at pre-op appointment 163. Last A1c 7.2 on 10/11/23  Last dose of GLP1 agonist- n/a GLP1 instructions: n/a  SGLT-2 Instructions: Pt instructed to hold Jardiance  for three days. Last dose will be June 19th.  Blood Thinner Instructions: Pt instructed to hold Plavix  5 days prior to surgery. Last dose will be June 17th Aspirin  Instructions:  NPO after midnight  COVID TEST- n/a   Anesthesia review: Yes. Medical Clearance. Hx of CAD with stent placed, HTN and DM2. Pt has also had recent cardiac testing within the last few months.  Patient denies shortness of breath, fever, cough and chest pain at PAT appointment. Pt denies any respiratory illness/infection in the last two months.   All instructions explained to the patient, with a verbal understanding of the material. Patient agrees to go over the instructions while at home for a better understanding. Patient also instructed to self quarantine after being tested for COVID-19. The opportunity to ask questions was provided.

## 2023-10-12 NOTE — Telephone Encounter (Signed)
   Name: Reginald Rios  DOB: [birth year]  MRN: [id number]  Primary Cardiologist: Zoe Hinds, MD  Chart reviewed as part of pre-operative protocol coverage. Because of [name]'s past medical history and time since last visit, he will require a follow-up in-office visit in order to better assess preoperative cardiovascular risk.  Has not been seen since his cardiac catheterization on 07/2023.  Will need to be seen in office prior to cardiac clearance.  He does have an office visit already scheduled for 11/15/2023 however surgery is scheduled for 10/25/2023.  He will need a new office visit prior to procedure.  Thank you  Pre-op covering staff: - Please schedule appointment and call patient to inform them. If patient already had an upcoming appointment within acceptable timeframe, please add "pre-op clearance" to the appointment notes so provider is aware. - Please contact requesting surgeon's office via preferred method (i.e, phone, fax) to inform them of need for appointment prior to surgery.  Per office protocol, he may hold Plavix  for 5 days prior to procedure. Please resume Plavix  as soon as possible postprocedure, at the discretion of the surgeon.   Ava Boatman, NP  10/12/2023, 3:57 PM

## 2023-10-13 NOTE — Telephone Encounter (Signed)
 Patient has been scheduled

## 2023-10-14 ENCOUNTER — Encounter: Payer: Self-pay | Admitting: Family Medicine

## 2023-10-14 ENCOUNTER — Other Ambulatory Visit: Payer: Self-pay | Admitting: Family Medicine

## 2023-10-14 ENCOUNTER — Other Ambulatory Visit (HOSPITAL_BASED_OUTPATIENT_CLINIC_OR_DEPARTMENT_OTHER): Payer: Self-pay

## 2023-10-14 DIAGNOSIS — K219 Gastro-esophageal reflux disease without esophagitis: Secondary | ICD-10-CM

## 2023-10-14 MED ORDER — PANTOPRAZOLE SODIUM 40 MG PO TBEC
40.0000 mg | DELAYED_RELEASE_TABLET | Freq: Every day | ORAL | 0 refills | Status: DC
  Filled 2023-10-14: qty 90, 90d supply, fill #0

## 2023-10-18 ENCOUNTER — Encounter: Attending: "Endocrinology | Admitting: Dietician

## 2023-10-18 ENCOUNTER — Encounter: Payer: Self-pay | Admitting: Dietician

## 2023-10-18 ENCOUNTER — Ambulatory Visit

## 2023-10-18 VITALS — BP 118/70 | HR 71 | Ht 70.0 in | Wt 196.0 lb

## 2023-10-18 DIAGNOSIS — I2510 Atherosclerotic heart disease of native coronary artery without angina pectoris: Secondary | ICD-10-CM

## 2023-10-18 DIAGNOSIS — E1165 Type 2 diabetes mellitus with hyperglycemia: Secondary | ICD-10-CM | POA: Diagnosis not present

## 2023-10-18 DIAGNOSIS — Z01810 Encounter for preprocedural cardiovascular examination: Secondary | ICD-10-CM | POA: Insufficient documentation

## 2023-10-18 HISTORY — DX: Encounter for preprocedural cardiovascular examination: Z01.810

## 2023-10-18 NOTE — Patient Instructions (Signed)
 Medication Instructions:  Ok to hold plavix  for the surgery *If you need a refill on your cardiac medications before your next appointment, please call your pharmacy*  Follow-Up: At [facility], you and your health needs are our priority.  As part of our continuing mission to provide you with exceptional heart care, our providers are all part of one team.  This team includes your primary Cardiologist (physician) and Advanced Practice Providers or APPs (Physician Assistants and Nurse Practitioners) who all work together to provide you with the care you need, when you need it.  Your next appointment:   6 month(s)  Provider:   Bertha Broad, MD    We recommend signing up for the patient portal called MyChart.  Sign up information is provided on this After Visit Summary.  MyChart is used to connect with patients for Virtual Visits (Telemedicine).  Patients are able to view lab/test results, encounter notes, upcoming appointments, etc.  Non-urgent messages can be sent to your provider as well.   To learn more about what you can do with MyChart, go to [email or web address].

## 2023-10-18 NOTE — Assessment & Plan Note (Signed)
 Doing well. Stable. Continue antiplatelet therapy with single agent.  Perisurgical antiplatelet hold as reviewed below under preop cardiovascular risk assessment.

## 2023-10-18 NOTE — Patient Instructions (Addendum)
 Eat mindfully Drink mostly water Resume exercise when you are able  Amy's soup (comes in low sodium varieties) Plant strong soups etc McDougal soups (reduce the seasoning packet to reduce the sodium)

## 2023-10-18 NOTE — Progress Notes (Unsigned)
 Diabetes Self-Management Education  Visit Type: Follow-up  Appt. Start Time: 1404 Appt. End Time: 1435  10/19/2023  [name], identified by name and date of birth, is a 68 y.o. male with a diagnosis of Diabetes:  .   ASSESSMENT Patient is here today alone.  He was last seen by this RD on 08/17/2023.  He states that he has made many changes. Eating an orange rather than cookies with his tea.  This has helped his snack cravings. Aiming to keep his carb intake <50 grams per meal Hello Fresh dinner (protein, vegetable, potato or rice) Fasting blood glucose 120-130 Back surgery scheduled for October 25, 2023.  Referral:  Type 2 Diabetes   History includes:  Type 2 Diabetes (1993), back/neck issues, hoarse, HLD, neuropathy, GERD, ADD, pernicious anemia Medications includes:  Jardiance , glyburide  (prn when glucose is >200), Metformin -XR, Pioglitizone, calcium  with D3, Vitamin B-12 injection, Vitamin D  50,000 units once per week, MVI Labs noted to include:  A1C 7.2% 10/11/2023 decreased from 9.3% 07/08/2023 increased 8% 01/26/2023, Vitamin B-12 was 110 on 07/05/23 and increased to 1537 on 08/09/2023 after a B-12 injection, Vitamin D  28 on 07/05/2023, positive intrinsic factor 08/09/2023.   68 197 lbs 10/18/2023  200 lbs 08/16/2024 210 lbs 02/2023   Patient lives with his wife and 2 adult children.  His wife is a Therapist, sports for [employer].  She does the shopping and they get Hello Fresh and patient cooks these meals.  They are choose more plant based and less carbs.  Avoids added salt.  Avoids eating out. Was playing pickle ball 3 times per week, walking dog 2 times per day, and yoga twice per week prior to increased back surgery and has since decreased this to 1-2 times per week. 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 Increased pain and some depression from chronic pain.     Diabetes Self-Management Education - 10/19/23 1633       Visit Information   Visit  Type Follow-up      Health Coping   How would you rate your overall health? Good      Psychosocial Assessment   Patient Belief/Attitude about Diabetes Motivated to manage diabetes    What is the hardest part about your diabetes right now, causing you the most concern, or is the most worrisome to you about your diabetes?   Being active    Self-care barriers None    Self-management support Doctor's office;Family    Other persons present Patient    Patient Concerns Nutrition/Meal planning;Glycemic Control;Healthy Lifestyle    Special Needs None    Preferred Learning Style No preference indicated    Learning Readiness Ready    How often do you need to have someone help you when you read instructions, pamphlets, or other written materials from your doctor or pharmacy? 1 - Never      Pre-Education Assessment   Patient understands the diabetes disease and treatment process. Demonstrates understanding / competency    Patient understands incorporating nutritional management into lifestyle. Comprehends key points    Patient undertands incorporating physical activity into lifestyle. Demonstrates understanding / competency    Patient understands using medications safely. Demonstrates understanding / competency    Patient understands monitoring blood glucose, interpreting and using results Demonstrates understanding / competency    Patient understands prevention, detection, and treatment of acute complications. Demonstrates understanding / competency    Patient understands prevention, detection, and treatment of chronic complications. Demonstrates understanding /  competency    Patient understands how to develop strategies to address psychosocial issues. Demonstrates understanding / competency    Patient understands how to develop strategies to promote health/change behavior. Comprehends key points      Complications   Last HgB A1C per patient/outside source 7.2 %   10/11/2023 decreased from 9.3%  07/08/2023   How often do you check your blood sugar? 1-2 times/day    Fasting Blood glucose range (mg/dL) 75-643    Postprandial Blood glucose range (mg/dL) 32-951;884-166      Dietary Intake   Breakfast Instant oatmeal , raisins, fresh pineapple OR eggs (2), [place] sausage, 1 slice [employer] bread OR omelet with cheese, bacon, English Muffin    Lunch Occasional burger and diet coke but blueberry pancakes with butter and regular syrup yesterday (Father's day)    Dinner Ribs, baked potato with cheese and bacon, salad with vinaigrette , 1/2 piece of cake OR Hello Fresh meal (protein, potato or rice, vegetable)    Beverage(s) water, mio, occasional diet coke      Activity / Exercise   Activity / Exercise Type Light (walking / raking leaves)    How many days per week do you exercise? 4    How many minutes per day do you exercise? 40    Total minutes per week of exercise 160      Patient Education   Previous Diabetes Education Yes (please comment)    Healthy Eating Meal options for control of blood glucose level and chronic complications.;Other (comment)   easy, ready to eat lower sodium soup   Being Active Role of exercise on diabetes management, blood pressure control and cardiac health.;Helped patient identify appropriate exercises in relation to his/her diabetes, diabetes complications and other health issue.    Medications Reviewed patients medication for diabetes, action, purpose, timing of dose and side effects.    Monitoring Taught/evaluated SMBG meter.;Identified appropriate SMBG and/or A1C goals.    Chronic complications Identified and discussed with patient  current chronic complications    Diabetes Stress and Support Identified and addressed patients feelings and concerns about diabetes;Worked with patient to identify barriers to care and solutions    Lifestyle and Health Coping Lifestyle issues that need to be addressed for better diabetes care      Individualized Goals (developed by  patient)   Nutrition General guidelines for healthy choices and portions discussed    Physical Activity Not Applicable   hold significant exercisefor back surgery next week   Medications take my medication as prescribed    Monitoring  Test my blood glucose as discussed    Problem Solving Eating Pattern      Patient Self-Evaluation of Goals - Patient rates self as meeting previously set goals (% of time)   Nutrition >75% (most of the time)    Physical Activity >75% (most of the time)    Medications >75% (most of the time)    Monitoring >75% (most of the time)    Problem Solving and behavior change strategies  >75% (most of the time)    Reducing Risk (treating acute and chronic complications) >75% (most of the time)    Health Coping >75% (most of the time)      Post-Education Assessment   Patient understands the diabetes disease and treatment process. Demonstrates understanding / competency    Patient understands incorporating nutritional management into lifestyle. Comprehends key points    Patient undertands incorporating physical activity into lifestyle. Demonstrates understanding / competency  Patient understands using medications safely. Demonstrates understanding / competency    Patient understands monitoring blood glucose, interpreting and using results Demonstrates understanding / competency    Patient understands prevention, detection, and treatment of acute complications. Demonstrates understanding / competency    Patient understands prevention, detection, and treatment of chronic complications. Demonstrates understanding / competency    Patient understands how to develop strategies to address psychosocial issues. Demonstrates understanding / competency    Patient understands how to develop strategies to promote health/change behavior. Comprehends key points      Outcomes   Expected Outcomes Demonstrated interest in learning. Expect positive outcomes    Future DMSE 3-4 months     Program Status Not Completed      Subsequent Visit   Since your last visit have you continued or begun to take your medications as prescribed? Yes    Since your last visit have you experienced any weight changes? Loss    Weight Loss (lbs) 3          Individualized Plan for Diabetes Self-Management Training:   Learning Objective:  Patient will have a greater understanding of diabetes self-management. Patient education plan is to attend individual and/or group sessions per assessed needs and concerns.   Plan:   Patient Instructions  Eat mindfully Drink mostly water Resume exercise when you are able  Amy's soup (comes in low sodium varieties) Plant strong soups etc McDougal soups (reduce the seasoning packet to reduce the sodium)    Expected Outcomes:  Demonstrated interest in learning. Expect positive outcomes  Education material provided:   If problems or questions, patient to contact team via:  Phone  Future DSME appointment: 3-4 months

## 2023-10-18 NOTE — Assessment & Plan Note (Signed)
 preop cardiovascular exam prior to lumbar fusion surgery under general anesthesia tentatively scheduled for June 23 with Dr. Ellery Guthrie at [facility] and requesting to hold Plavix .  From cardiac standpoint stable.  No active cardiac symptoms. Proceed with surgery as being planned.  Perioperative antiplatelet regimen discussed.  He is currently on Plavix  75 mg once daily. Okay to hold this as requested from surgical standpoint.  However given his underlying coronary artery disease history and known recent cardiac cath as noted, I would recommend at least being on an aspirin  81 mg once daily if not prohibitive from surgical standpoint.  Given his underlying coronary artery disease completely going off antiplatelet therapy does pose slightly higher risk for perioperative MI.  Will defer this to her shared decision making between the surgeon and the patient with regards to whether or not to continue baby aspirin  81 mg once daily around the time of his surgery.   If maintained on aspirin  perioperatively, continue with aspirin  until subsequent follow-up with us  in the office.  If all antiplatelets are held perioperatively, resume Plavix  post surgery when okay with the surgeon.

## 2023-10-18 NOTE — Progress Notes (Signed)
 Cardiology Consultation:    Date:  10/18/2023   ID:  Reginald Rios, DOB 09-09-1955, MRN [id number]  PCP:  Jobe Mulder, DO  Cardiologist:  Daymon Evans Shirlean Berman, MD   Referring MD: Jobe Mulder*   No chief complaint on file.    ASSESSMENT AND PLAN:   Reginald Rios 68 year old male with history of CAD with prior PCI of LCx February 2019, prior stress echocardiogram June 2023 with good exercise capacity and no ischemia, with progressive symptoms of shortness of breath concerning for unstable angina underwent cardiac catheterization 07-14-2023 that noted mid LAD 40 to 50% disease unchanged from prior cath in 2019, patent LCx stents, 40% proximal OM 2 disease unchanged from prior cath, very distal L PDA 90% stenosis in a small caliber vessel which was once again unchanged and overall findings were felt to be stable in comparison to prior.  Continued medical therapy recommended.  Echocardiogram July 13, 2023 noted normal biventricular function with EF 60 to 65% and normal diastolic function without significant valve abnormalities. He also has history of diabetes mellitus type 2, hyper lipidemia, sinus bradycardia, vertigo, chronic back pain, asthma.  Here for preop cardiovascular risk assessment prior to elective lumbar fusion surgery scheduled for next week.  Problem List Items Addressed This Visit     CAD (coronary artery disease)   Doing well. Stable. Continue antiplatelet therapy with single agent.  Perisurgical antiplatelet hold as reviewed below under preop cardiovascular risk assessment.      Preop cardiovascular exam - Primary   preop cardiovascular exam prior to lumbar fusion surgery under general anesthesia tentatively scheduled for June 23 with Dr. Ellery Guthrie at [facility] and requesting to hold Plavix .  From cardiac standpoint stable.  No active cardiac symptoms. Proceed with surgery as being planned.  Perioperative  antiplatelet regimen discussed.  He is currently on Plavix  75 mg once daily. Okay to hold this as requested from surgical standpoint.  However given his underlying coronary artery disease history and known recent cardiac cath as noted, I would recommend at least being on an aspirin  81 mg once daily if not prohibitive from surgical standpoint.  Given his underlying coronary artery disease completely going off antiplatelet therapy does pose slightly higher risk for perioperative MI.  Will defer this to her shared decision making between the surgeon and the patient with regards to whether or not to continue baby aspirin  81 mg once daily around the time of his surgery.   If maintained on aspirin  perioperatively, continue with aspirin  until subsequent follow-up with us  in the office.  If all antiplatelets are held perioperatively, resume Plavix  post surgery when okay with the surgeon.      Relevant Orders   EKG 12-Lead (Completed)    Return to clinic for follow-up tentatively in 6 months.  History of Present Illness:    Reginald Rios is a 68 y.o. male who is being seen today for the evaluation of preop cardiovascular exam prior to lumbar fusion surgery under general anesthesia tentatively scheduled for June 23 with Dr. Ellery Guthrie at [facility] and requesting to hold Plavix . At the request of Jobe Mulder*. He had a last visit with me in the office 07/08/2023. Routinely follows up with Dr. Revankar.  Has history of CAD with prior PCI of LCx February 2019, prior stress echocardiogram June 2023 with good exercise capacity and no ischemia, with progressive symptoms of shortness of breath concerning for unstable angina underwent cardiac catheterization  07-14-2023 that noted mid LAD 40 to 50% disease unchanged from prior cath in 2019, patent LCx stents, 40% proximal OM 2 disease unchanged from prior cath, very distal L PDA 90% stenosis in a small caliber  vessel which was once again unchanged and overall findings were felt to be stable in comparison to prior.  Continued medical therapy recommended.  Echocardiogram July 13, 2023 noted normal biventricular function with EF 60 to 65% and normal diastolic function without significant valve abnormalities. He also has history of diabetes mellitus type 2, hyper lipidemia, sinus bradycardia, vertigo, chronic back pain, asthma.  Here for visit today and by himself. Denies any active cardiac symptoms. His breathing symptoms improved with optimization of asthma/COPD therapy.  Now with chronic back pain limiting mobility is anticipating surgery next week. He has been advised to stop Plavix  tomorrow.   No other ongoing cardiac symptoms. Good tolerance with his medications. Good blood pressure control.    EKG in the clinic today shows sinus rhythm heart rate 67/min, PR interval normal 134 ms, QRS duration 100 ms with LVH criteria with nonspecific ST-T changes in inferolateral leads.  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 No issues now   Hemarthrosis of right elbow 02/03/2016   Hoarseness 04/13/2019   Hyperlipidemia    family hx of high cholesterol   Hypertension    Hypogonadism in male    Incomplete rotator cuff tear 09/10/2015   Injected 09/10/2015   Lateral epicondylitis of right elbow 07/28/2019   Low back pain at multiple sites 02/05/2021   Low vitamin D  level 04/22/2017   Mild persistent asthma/cough variant asthma 04/13/2019   Myofascial pain syndrome, cervical 07/28/2019   Neck pain 06/01/2018   Nonischemic cardiomyopathy (HCC) 05/15/2015   Nontraumatic incomplete tear of right rotator cuff 07/25/2019   Occipital headache 06/01/2018   Onychomycosis 05/25/2018   Osteoarthritis    Pain in right ankle and joints of right foot 10/05/2016   Palpitations 07/06/2017   Pernicious anemia    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   Shoulder bursitis 10/12/2014   Injected in 10/12/2014  Repeat 01/21/2015   Small thenar eminence 02/03/2016   Spinal stenosis, lumbar region, without neurogenic claudication 10/12/2014   Spondylolisthesis at L5-S1 level 05/09/2021   Strain of latissimus dorsi muscle 01/21/2015   Tendinopathy of right biceps tendon 07/25/2019   Type 2 diabetes mellitus with hyperglycemia, without long-term current use of insulin  (HCC) 12/22/2013   Ulnar neuropathy at elbow of right upper extremity 01/03/2019    Past Surgical History:  Procedure Laterality Date   CATARACT EXTRACTION W/ INTRAOCULAR LENS IMPLANT Bilateral 2023   COLONOSCOPY     CORONARY PRESSURE/FFR STUDY N/A 06/23/2017   Procedure: INTRAVASCULAR PRESSURE WIRE/FFR STUDY;  Surgeon: Arleen Lacer, MD;  Location: [facility] INVASIVE CV LAB;  Service: Cardiovascular;  Laterality: N/A;   CORONARY STENT INTERVENTION N/A 06/23/2017   Procedure: CORONARY STENT INTERVENTION;  Surgeon: Arleen Lacer, MD;  Location: [facility] INVASIVE CV LAB;  Service:  Cardiovascular;  Laterality: N/A;   KNEE ARTHROSCOPY Left    LEFT HEART CATH AND CORONARY ANGIOGRAPHY N/A 06/23/2017   Procedure: LEFT HEART CATH AND CORONARY ANGIOGRAPHY;  Surgeon: Arleen Lacer, MD;  Location: [facility] INVASIVE CV LAB;  Service: Cardiovascular;  Laterality: N/A;   LEFT HEART CATH AND CORONARY ANGIOGRAPHY N/A 07/14/2023   Procedure: LEFT HEART CATH AND CORONARY ANGIOGRAPHY;  Surgeon: Cody Das, MD;  Location: [facility] INVASIVE CV LAB;  Service: Cardiovascular;  Laterality: N/A;   SHOULDER ARTHROSCOPY WITH BICEPSTENOTOMY Right 09/06/2019   Procedure: SHOULDER ARTHROSCOPY WITH BICEPSTENOTOMY;  Surgeon: Wes Hamman, MD;  Location: [facility];  Service: Orthopedics;  Laterality: Right;   SHOULDER ARTHROSCOPY WITH SUBACROMIAL DECOMPRESSION Right 09/06/2019   Procedure: RIGHT SHOULDER ARTHROSCOPY WITH EXTENSIVE DEBRIDEMENT, SUBACROMIAL DECOMPRESSION, BICEPS TENOTOMY;  Surgeon: Wes Hamman, MD;  Location: [facility];  Service: Orthopedics;  Laterality: Right;   TENOTOMY ACHILLES TENDON  2007   In [place], [place]   ULNAR NERVE TRANSPOSITION  2006   In [place], [place]    Current Medications: Current Meds  Medication Sig   acetaminophen  (TYLENOL ) 500 MG tablet Take 1,000 mg by mouth every 6 (six) hours as needed for moderate pain (pain score 4-6).   albuterol  (VENTOLIN  HFA) 108 (90 Base) MCG/ACT inhaler Inhale 2 puffs into the lungs every 4 (four) to 6 (six) hours as needed for coughing or wheezing spells.   amLODipine  (NORVASC ) 2.5 MG tablet Take 1 tablet (2.5 mg total) by mouth daily.   azelastine  (ASTELIN ) 0.1 % nasal spray Place 2 sprays into both nostrils 2 (two) times daily as needed for rhinitis (nasal congestion). (Patient taking differently: Place 2 sprays into both nostrils at bedtime.)   Blood Glucose Monitoring Suppl (FREESTYLE FREEDOM LITE) w/Device KIT Use to check blood sugar daily.   clopidogrel  (PLAVIX ) 75 MG tablet Take 1 tablet (75  mg total) by mouth daily.   diclofenac  Sodium (VOLTAREN ) 1 % GEL Apply 2 g topically 4 (four) times daily. (Patient taking differently: Apply 2 g topically 4 (four) times daily as needed (pain).)   empagliflozin  (JARDIANCE ) 25 MG TABS tablet Take 1 tablet (25 mg total) by mouth daily before breakfast.   ezetimibe  (ZETIA ) 10 MG tablet Take 1 tablet (10 mg total) by mouth daily.   fluticasone  (FLONASE ) 50 MCG/ACT nasal spray Place 2 sprays into both nostrils daily.   Glucagon  (GVOKE HYPOPEN  1-PACK) 1 MG/0.2ML SOAJ Inject into the skin in the event of low sugar.   glucose blood (FREESTYLE LITE) test strip Use daily to check blood sugar.   glucose blood (FREESTYLE LITE) test strip Use daily to check blood sugar.   glyBURIDE  (DIABETA ) 2.5 MG tablet Take 1 tablet (2.5 mg total) by mouth daily with breakfast. (Patient taking differently: Take 2.5 mg by mouth daily as needed (elevated blood sugar).)   Lancets (FREESTYLE) lancets Use daily to check blood sugar   losartan  (COZAAR ) 25 MG tablet Take 1 tablet (25 mg total) by mouth daily.   meclizine  (ANTIVERT ) 25 MG  tablet Take 1 tablet (25 mg total) by mouth 3 (three) times daily as needed for dizziness.   metFORMIN  (GLUCOPHAGE -XR) 500 MG 24 hr tablet Take 2 tablets (1,000 mg total) by mouth 2 (two) times daily with a meal.   methylphenidate  (RITALIN ) 20 MG tablet Take 1 tablet (20 mg total) by mouth 2 (two) times daily.   methylphenidate  (RITALIN ) 20 MG tablet Take 1 tablet (20 mg total) by mouth 2 (two) times daily.   methylphenidate  (RITALIN ) 20 MG tablet Take 1 tablet (20 mg total) by mouth 2 (two) times daily.   montelukast  (SINGULAIR ) 10 MG tablet Take 1 tablet (10 mg total) by mouth daily.   Multiple Vitamin (MULTIVITAMIN WITH MINERALS) TABS tablet Take 1 tablet by mouth in the morning. One A Day   nitroGLYCERIN  (NITROSTAT ) 0.4 MG SL tablet Place 0.4 mg under the tongue every 5 (five) minutes x 3 doses as needed for chest pain.   pantoprazole   (PROTONIX ) 40 MG tablet Take 1 tablet (40 mg total) by mouth daily.   pioglitazone  (ACTOS ) 45 MG tablet Take 1 tablet (45 mg total) by mouth daily.   Probiotic Product (PROBIOTIC PO) Take 2 capsules by mouth in the morning.   rosuvastatin  (CRESTOR ) 5 MG tablet Take 1 tablet (5 mg total) by mouth daily.   sildenafil  (VIAGRA ) 100 MG tablet Take 1 tablet (100 mg total) by mouth daily as needed. Take 1 hour prior to sexual activity   testosterone  (ANDROGEL ) 50 MG/5GM (1%) GEL Apply 2 applications onto the skin daily.   Vitamin D , Ergocalciferol , (DRISDOL ) 1.25 MG (50000 UNIT) CAPS capsule Take 1 capsule (50,000 Units total) by mouth every 7 (seven) days. (Patient taking differently: Take 50,000 Units by mouth every Friday.)     Allergies:   Invokana  [canagliflozin ], Grass pollen([id number]-swt vern), Lisinopril , and Semaglutide    Social History   Socioeconomic History   Marital status: Married    Spouse name: Not on file   Number of children: Not on file   Years of education: Not on file   Highest education level: Some college, no degree  Occupational History   Occupation: Conservation officer, historic buildings   Tobacco Use   Smoking status: Never    Passive exposure: Never   Smokeless tobacco: Never  Vaping Use   Vaping status: Never Used  Substance and Sexual Activity   Alcohol use: Not Currently    Comment: 1-2 drinks/monthly   Drug use: No   Sexual activity: Yes  Other Topics Concern   Not on file  Social History Narrative   Previously worked as Environmental manager   Currently working at [employer]   Married 30 years   2 children ages 41, 79   Wife is psychiatrist - Dr. Alberteen Huge   Moved from [place]   Grew up in [place]   Social Drivers of Health   Financial Resource Strain: Low Risk  (05/07/2023)   Overall Financial Resource Strain (CARDIA)    Difficulty of Paying Living Expenses: Not hard at all  Food Insecurity: No Food Insecurity (05/07/2023)   Hunger Vital Sign    Worried About Running  Out of Food in the Last Year: Never true    Ran Out of Food in the Last Year: Never true  Transportation Needs: No Transportation Needs (05/07/2023)   PRAPARE - Administrator, Civil Service (Medical): No    Lack of Transportation (Non-Medical): No  Physical Activity: Sufficiently Active (05/07/2023)   Exercise Vital Sign    Days of Exercise  per Week: 7 days    Minutes of Exercise per Session: 30 min  Stress: No Stress Concern Present (05/07/2023)   [employer] of Occupational Health - Occupational Stress Questionnaire    Feeling of Stress : Not at all  Social Connections: Socially Integrated (05/07/2023)   Social Connection and Isolation Panel    Frequency of Communication with Friends and Family: More than three times a week    Frequency of Social Gatherings with Friends and Family: Once a week    Attends Religious Services: 1 to 4 times per year    Active Member of [employer] or Organizations: Yes    Attends Banker Meetings: 1 to 4 times per year    Marital Status: Married     Family History: The patient's family history includes Asthma in his paternal aunt; Cancer in his mother; Coronary artery disease (age of onset: 29) in his father; Food Allergy in his son; Heart disease in his father; Hypothyroidism in his brother; Lung cancer (age of onset: 59) in his mother. There is no history of Colon cancer, Prostate cancer, Allergic rhinitis, Angioedema, Eczema, Immunodeficiency, or Urticaria. ROS:   Please see the history of present illness.    All 14 point review of systems negative except as described per history of present illness.  EKGs/Labs/Other Studies Reviewed:    The following studies were reviewed today:   EKG:  EKG Interpretation Date/Time:  Monday October 18 2023 15:59:06 EDT Ventricular Rate:  67 PR Interval:  134 QRS Duration:  100 QT Interval:  386 QTC Calculation: 407 R Axis:   19  Text Interpretation: Normal sinus rhythm Minimal voltage  criteria for LVH, may be normal variant ( R in aVL ) Inferior infarct , age undetermined When compared with ECG of 31-Dec-2022 14:08, No significant change was found Confirmed by Bertha Broad reddy [phone]) on 10/18/2023 4:11:08 PM    Recent Labs: 07/05/2023: TSH 2.63 08/09/2023: ALT 26 10/12/2023: BUN 16; Creatinine, Ser 0.85; Hemoglobin 12.8; Platelets 211; Potassium 3.9; Sodium 142  Recent Lipid Panel    Component Value Date/Time   CHOL 114 04/13/2023 0900   CHOL 156 04/22/2022 1439   TRIG 67.0 04/13/2023 0900   HDL 45.00 04/13/2023 0900   HDL 53 04/22/2022 1439   CHOLHDL 3 04/13/2023 0900   VLDL 13.4 04/13/2023 0900   LDLCALC 55 04/13/2023 0900   LDLCALC 89 04/22/2022 1439    Physical Exam:    VS:  BP 118/70   Pulse 71   Ht 5' 10 (1.778 m)   Wt 196 lb (88.9 kg)   SpO2 98%   BMI 28.12 kg/m     Wt Readings from Last 3 Encounters:  10/18/23 196 lb (88.9 kg)  10/12/23 195 lb (88.5 kg)  10/11/23 197 lb (89.4 kg)     GENERAL:  Well nourished, well developed in no acute distress NECK: No JVD; No carotid bruits CARDIAC: RRR, S1 and S2 present, no murmurs, no rubs, no gallops CHEST:  Clear to auscultation without rales, wheezing or rhonchi  Extremities: No pitting pedal edema. Pulses bilaterally symmetric with radial 2+ and dorsalis pedis 2+ NEUROLOGIC:  Alert and oriented x 3  Medication Adjustments/Labs and Tests Ordered: Current medicines are reviewed at length with the patient today.  Concerns regarding medicines are outlined above.  Orders Placed This Encounter  Procedures   EKG 12-Lead   No orders of the defined types were placed in this encounter.   Signed, Liam Cammarata reddy Khady Vandenberg, MD, MPH, [facility].  10/18/2023 4:41 PM    [facility]

## 2023-10-21 ENCOUNTER — Ambulatory Visit

## 2023-10-21 ENCOUNTER — Encounter (HOSPITAL_COMMUNITY): Payer: Self-pay

## 2023-10-21 NOTE — Progress Notes (Signed)
 Anesthesia Chart Review:  68 yo male follows with cardiology for hx of CAD with prior PCI of LCx February 2019, prior stress echocardiogram June 2023 with good exercise capacity and no ischemia, with progressive symptoms of shortness of breath concerning for unstable angina underwent cardiac catheterization 07-14-2023 that noted mid LAD 40 to 50% disease unchanged from prior cath in 2019, patent LCx stents, 40% proximal OM 2 disease unchanged from prior cath, very distal L PDA 90% stenosis in a small caliber vessel which was once again unchanged and overall findings were felt to be stable in comparison to prior. Continued medical therapy recommended. Echocardiogram July 13, 2023 noted normal biventricular function with EF 60 to 65% and normal diastolic function without significant valve abnormalities. Seen by Dr. Ronell Coe 10/18/23 for preop eval. Per note, preop cardiovascular exam prior to lumbar fusion surgery under general anesthesia tentatively scheduled for June 23 with Dr. Ellery Guthrie at [facility] and requesting to hold Plavix . From cardiac standpoint stable.  No active cardiac symptoms. Proceed with surgery as being planned. Perioperative antiplatelet regimen discussed. He is currently on Plavix  75 mg once daily. Okay to hold this as requested from surgical standpoint. However given his underlying coronary artery disease history and known recent cardiac cath as noted, I would recommend at least being on an aspirin  81 mg once daily if not prohibitive from surgical standpoint. Given his underlying coronary artery disease completely going off antiplatelet therapy does pose slightly higher risk for perioperative MI. Will defer this to her shared decision making between the surgeon and the patient with regards to whether or not to continue baby aspirin  81 mg once daily around the time of his surgery.  Patient reports last dose Plavix  10/21/2023.  Recent ENT evaluation at [facility] [place]  on 08/25/2023 for intermittent dysphonia.  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  He was prescribed nystatin  and recommended to continue PPI first thing in the morning.  Voice therapy was discussed but deferred at that time.  Other pertinent history includes non-insulin -dependent DM2 (A1c 7.2 on 10/11/2023), PONV, GERD on PPI, asthma, s/p prior C4-6 ACDF.  Preop labs reviewed, mild anemia with hemoglobin 12.8, otherwise unremarkable.  EKG 10/18/2023: Normal sinus rhythm.  Rate 67. Minimal voltage criteria for LVH, may be normal variant. Inferior infarct , age undetermined  Coronary angiography 07/14/2023: LM: No significant disease LAD: Mid 40-50% disease (unclear if calcific native disease or ISR, unchanged compared to 2019 Lcx: Large dominant vessel         Patent prox and mid-distal Lcx stents with no significant stenosis         OM1, OM2 prox 40% disease, unchanged since 2019         Very distal LPDA 90% stenosis ina very small caliber vessel (new since 2019) RCA: Small dominant vessel, mild diffuse disease   LVEDP 12 mmHg  TTE 07/13/2023: 1. Left ventricular ejection fraction, by estimation, is 60 to 65%. The  left ventricle has normal function. The left ventricle has no regional  wall motion abnormalities. There is mild concentric left ventricular  hypertrophy. Left ventricular diastolic  parameters were normal.   2. Right ventricular systolic function is normal. The right ventricular  size is normal. There is normal pulmonary artery systolic pressure.   3. The mitral valve is normal in structure. No evidence of mitral valve  regurgitation. No evidence of mitral stenosis.   4.  The aortic valve is tricuspid. Aortic valve regurgitation is not  visualized. Aortic valve sclerosis is present, with no evidence of aortic  valve stenosis.   5. Aortic Normal DTA.   6. The  inferior vena cava is normal in size with greater than 50%  respiratory variability, suggesting right atrial pressure of 3 mmHg.    Ed[facility]/Anesthesiology Phone [phone] 10/21/2023 12:59 PM

## 2023-10-21 NOTE — Anesthesia Preprocedure Evaluation (Signed)
 Anesthesia Evaluation    Airway        Dental   Pulmonary           Cardiovascular hypertension,      Neuro/Psych    GI/Hepatic   Endo/Other  diabetes    Renal/GU      Musculoskeletal   Abdominal   Peds  Hematology   Anesthesia Other Findings   Reproductive/Obstetrics                              Anesthesia Physical Anesthesia Plan  ASA:   Anesthesia Plan:    Post-op Pain Management:    Induction:   PONV Risk Score and Plan:   Airway Management Planned:   Additional Equipment:   Intra-op Plan:   Post-operative Plan:   Informed Consent:   Plan Discussed with:   Anesthesia Plan Comments: (PAT note by Rudy Costain, PA-C:  68 yo male follows with cardiology for hx of CAD with prior PCI of LCx February 2019, prior stress echocardiogram June 2023 with good exercise capacity and no ischemia, with progressive symptoms of shortness of breath concerning for unstable angina underwent cardiac catheterization 07-14-2023 that noted mid LAD 40 to 50% disease unchanged from prior cath in 2019, patent LCx stents, 40% proximal OM 2 disease unchanged from prior cath, very distal L PDA 90% stenosis in a small caliber vessel which was once again unchanged and overall findings were felt to be stable in comparison to prior. Continued medical therapy recommended. Echocardiogram July 13, 2023 noted normal biventricular function with EF 60 to 65% and normal diastolic function without significant valve abnormalities. Seen by Dr. Ronell Coe 10/18/23 for preop eval. Per note, preop cardiovascular exam prior to lumbar fusion surgery under general anesthesia tentatively scheduled for June 23 with Dr. Ellery Guthrie at [facility] and requesting to hold Plavix . From cardiac standpoint stable.  No active cardiac symptoms. Proceed with surgery as being planned. Perioperative antiplatelet regimen  discussed. He is currently on Plavix  75 mg once daily. Okay to hold this as requested from surgical standpoint. However given his underlying coronary artery disease history and known recent cardiac cath as noted, I would recommend at least being on an aspirin  81 mg once daily if not prohibitive from surgical standpoint. Given his underlying coronary artery disease completely going off antiplatelet therapy does pose slightly higher risk for perioperative MI. Will defer this to her shared decision making between the surgeon and the patient with regards to whether or not to continue baby aspirin  81 mg once daily around the time of his surgery.  Patient reports last dose Plavix  10/21/2023.  Recent ENT evaluation at [facility] on 08/25/2023 for intermittent dysphonia.  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  He was prescribed nystatin  and recommended to continue PPI first thing in the morning.  Voice therapy was discussed but deferred at that time.  Other pertinent history includes non-insulin -dependent DM2 (A1c 7.2 on 10/11/2023), PONV, GERD on PPI, asthma, s/p prior C4-6 ACDF.  Preop labs reviewed, mild anemia with hemoglobin 12.8, otherwise unremarkable.  EKG 10/18/2023: Normal sinus rhythm.  Rate 67. Minimal voltage criteria for LVH, may be normal variant. Inferior infarct , age undetermined  Coronary angiography 07/14/2023: LM: No significant disease LAD: Mid 40-50% disease (unclear if calcific native disease or ISR, unchanged compared to 2019 Lcx: Large dominant vessel  Patent prox and mid-distal Lcx stents with no significant stenosis         OM1, OM2 prox 40% disease, unchanged since 2019         Very distal LPDA 90% stenosis ina very small caliber vessel (new since 2019) RCA: Small dominant vessel, mild diffuse disease   LVEDP 12 mmHg  TTE 07/13/2023: 1. Left ventricular  ejection fraction, by estimation, is 60 to 65%. The  left ventricle has normal function. The left ventricle has no regional  wall motion abnormalities. There is mild concentric left ventricular  hypertrophy. Left ventricular diastolic  parameters were normal.   2. Right ventricular systolic function is normal. The right ventricular  size is normal. There is normal pulmonary artery systolic pressure.   3. The mitral valve is normal in structure. No evidence of mitral valve  regurgitation. No evidence of mitral stenosis.   4. The aortic valve is tricuspid. Aortic valve regurgitation is not  visualized. Aortic valve sclerosis is present, with no evidence of aortic  valve stenosis.   5. Aortic Normal DTA.   6. The inferior vena cava is normal in size with greater than 50%  respiratory variability, suggesting right atrial pressure of 3 mmHg.   )         Anesthesia Quick Evaluation

## 2023-10-25 ENCOUNTER — Other Ambulatory Visit: Payer: Self-pay

## 2023-10-25 ENCOUNTER — Ambulatory Visit (HOSPITAL_BASED_OUTPATIENT_CLINIC_OR_DEPARTMENT_OTHER): Payer: Self-pay | Admitting: Anesthesiology

## 2023-10-25 ENCOUNTER — Ambulatory Visit (HOSPITAL_COMMUNITY): Payer: Self-pay | Admitting: Physician Assistant

## 2023-10-25 ENCOUNTER — Ambulatory Visit (HOSPITAL_COMMUNITY)

## 2023-10-25 ENCOUNTER — Encounter (HOSPITAL_COMMUNITY)
Admission: RE | Disposition: A | Discharge status: Home / Self Care | Payer: Self-pay | Source: Home / Self Care | Attending: Neurological Surgery

## 2023-10-25 ENCOUNTER — Ambulatory Visit (HOSPITAL_COMMUNITY)
Admission: RE | Admit: 2023-10-25 | Discharge: 2023-10-26 | Disposition: A | Discharge status: Home / Self Care | Attending: Neurological Surgery | Admitting: Neurological Surgery

## 2023-10-25 ENCOUNTER — Encounter (HOSPITAL_COMMUNITY): Payer: Self-pay | Admitting: Neurological Surgery

## 2023-10-25 DIAGNOSIS — M4317 Spondylolisthesis, lumbosacral region: Secondary | ICD-10-CM

## 2023-10-25 DIAGNOSIS — M48062 Spinal stenosis, lumbar region with neurogenic claudication: Secondary | ICD-10-CM | POA: Insufficient documentation

## 2023-10-25 DIAGNOSIS — Z7984 Long term (current) use of oral hypoglycemic drugs: Secondary | ICD-10-CM | POA: Diagnosis not present

## 2023-10-25 DIAGNOSIS — I2510 Atherosclerotic heart disease of native coronary artery without angina pectoris: Secondary | ICD-10-CM | POA: Diagnosis not present

## 2023-10-25 DIAGNOSIS — M4727 Other spondylosis with radiculopathy, lumbosacral region: Secondary | ICD-10-CM | POA: Diagnosis present

## 2023-10-25 DIAGNOSIS — E119 Type 2 diabetes mellitus without complications: Secondary | ICD-10-CM | POA: Insufficient documentation

## 2023-10-25 DIAGNOSIS — I10 Essential (primary) hypertension: Secondary | ICD-10-CM | POA: Insufficient documentation

## 2023-10-25 DIAGNOSIS — Z7902 Long term (current) use of antithrombotics/antiplatelets: Secondary | ICD-10-CM | POA: Diagnosis not present

## 2023-10-25 DIAGNOSIS — J45909 Unspecified asthma, uncomplicated: Secondary | ICD-10-CM | POA: Diagnosis not present

## 2023-10-25 DIAGNOSIS — Z0189 Encounter for other specified special examinations: Secondary | ICD-10-CM | POA: Diagnosis not present

## 2023-10-25 DIAGNOSIS — Z955 Presence of coronary angioplasty implant and graft: Secondary | ICD-10-CM | POA: Diagnosis not present

## 2023-10-25 DIAGNOSIS — Z79899 Other long term (current) drug therapy: Secondary | ICD-10-CM | POA: Diagnosis not present

## 2023-10-25 DIAGNOSIS — M47816 Spondylosis without myelopathy or radiculopathy, lumbar region: Secondary | ICD-10-CM | POA: Diagnosis not present

## 2023-10-25 LAB — GLUCOSE, CAPILLARY
Glucose-Capillary: 148 mg/dL — ABNORMAL HIGH (ref 70–99)
Glucose-Capillary: 129 mg/dL — ABNORMAL HIGH (ref 70–99)
Glucose-Capillary: 200 mg/dL — ABNORMAL HIGH (ref 70–99)
Glucose-Capillary: 195 mg/dL — ABNORMAL HIGH (ref 70–99)
Glucose-Capillary: 203 mg/dL — ABNORMAL HIGH (ref 70–99)

## 2023-10-25 LAB — POCT I-STAT EG7
Acid-base deficit: 4 mmol/L — ABNORMAL HIGH (ref 0.0–2.0)
Acid-base deficit: 3 mmol/L — ABNORMAL HIGH (ref 0.0–2.0)
Bicarbonate: 21.6 mmol/L (ref 20.0–28.0)
Bicarbonate: 23.4 mmol/L (ref 20.0–28.0)
Calcium, Ion: 1.22 mmol/L (ref 1.15–1.40)
Calcium, Ion: 1.18 mmol/L (ref 1.15–1.40)
HCT: 30 % — ABNORMAL LOW (ref 39.0–52.0)
HCT: 25 % — ABNORMAL LOW (ref 39.0–52.0)
Hemoglobin: 10.2 g/dL — ABNORMAL LOW (ref 13.0–17.0)
Hemoglobin: 8.5 g/dL — ABNORMAL LOW (ref 13.0–17.0)
O2 Saturation: 97 %
O2 Saturation: 97 %
Potassium: 4.4 mmol/L (ref 3.5–5.1)
Potassium: 4.3 mmol/L (ref 3.5–5.1)
Sodium: 140 mmol/L (ref 135–145)
Sodium: 141 mmol/L (ref 135–145)
TCO2: 23 mmol/L (ref 22–32)
TCO2: 25 mmol/L (ref 22–32)
pCO2, Ven: 39.2 mmHg — ABNORMAL LOW (ref 44–60)
pCO2, Ven: 47 mmHg (ref 44–60)
pH, Ven: 7.348 (ref 7.25–7.43)
pH, Ven: 7.305 (ref 7.25–7.43)
pO2, Ven: 95 mmHg — ABNORMAL HIGH (ref 32–45)
pO2, Ven: 96 mmHg — ABNORMAL HIGH (ref 32–45)

## 2023-10-25 LAB — PREPARE RBC (CROSSMATCH)

## 2023-10-25 LAB — ABO/RH: ABO/RH(D): O POS

## 2023-10-25 SURGERY — POSTERIOR LUMBAR FUSION 2 LEVEL
Anesthesia: General | Site: Back

## 2023-10-25 MED ORDER — ACETAMINOPHEN 10 MG/ML IV SOLN: 1000.0000 mg | Freq: Once | INTRAVENOUS | Status: DC | PRN

## 2023-10-25 MED ORDER — ACETAMINOPHEN 325 MG PO TABS: 650.0000 mg | ORAL_TABLET | ORAL | Status: DC | PRN

## 2023-10-25 MED ORDER — PHENOL 1.4 % MT LIQD: 1.0000 | OROMUCOSAL | Status: DC | PRN

## 2023-10-25 MED ORDER — SODIUM CHLORIDE 0.9 % IV SOLN
250.0000 mL | INTRAVENOUS | Status: DC
  Administered 2023-10-25: 250 mL via INTRAVENOUS

## 2023-10-25 MED ORDER — ROCURONIUM BROMIDE 10 MG/ML (PF) SYRINGE
PREFILLED_SYRINGE | INTRAVENOUS | Status: DC | PRN
  Administered 2023-10-25: 30 mg via INTRAVENOUS
  Administered 2023-10-25: 60 mg via INTRAVENOUS
  Administered 2023-10-25: 30 mg via INTRAVENOUS
  Administered 2023-10-25 (×4): 20 mg via INTRAVENOUS

## 2023-10-25 MED ORDER — EMPAGLIFLOZIN 25 MG PO TABS
25.0000 mg | ORAL_TABLET | Freq: Every day | ORAL | Status: DC
  Administered 2023-10-26: 25 mg via ORAL
  Filled 2023-10-25 (×2): qty 1

## 2023-10-25 MED ORDER — FENTANYL CITRATE (PF) 250 MCG/5ML IJ SOLN
INTRAMUSCULAR | Status: AC
Start: 2023-10-25 — End: 2023-10-25
  Filled 2023-10-25: qty 5

## 2023-10-25 MED ORDER — 0.9 % SODIUM CHLORIDE (POUR BTL) OPTIME
TOPICAL | Status: DC | PRN
  Administered 2023-10-25: 1000 mL

## 2023-10-25 MED ORDER — ROCURONIUM BROMIDE 10 MG/ML (PF) SYRINGE
PREFILLED_SYRINGE | INTRAVENOUS | Status: AC
  Filled 2023-10-25: qty 10

## 2023-10-25 MED ORDER — PHENYLEPHRINE 80 MCG/ML (10ML) SYRINGE FOR IV PUSH (FOR BLOOD PRESSURE SUPPORT)
PREFILLED_SYRINGE | INTRAVENOUS | Status: AC
  Filled 2023-10-25: qty 10

## 2023-10-25 MED ORDER — INSULIN ASPART 100 UNIT/ML IJ SOLN: 0.0000 [IU] | INTRAMUSCULAR | Status: DC | PRN

## 2023-10-25 MED ORDER — CEFAZOLIN SODIUM 1 G IJ SOLR
INTRAMUSCULAR | Status: AC
  Filled 2023-10-25: qty 20

## 2023-10-25 MED ORDER — CHLORHEXIDINE GLUCONATE CLOTH 2 % EX PADS: 6.0000 | MEDICATED_PAD | Freq: Once | CUTANEOUS | Status: DC

## 2023-10-25 MED ORDER — SENNA 8.6 MG PO TABS
1.0000 | ORAL_TABLET | Freq: Two times a day (BID) | ORAL | Status: DC
  Administered 2023-10-25 – 2023-10-26 (×2): 8.6 mg via ORAL
  Filled 2023-10-25 (×2): qty 1

## 2023-10-25 MED ORDER — ALBUMIN HUMAN 5 % IV SOLN
INTRAVENOUS | Status: AC
  Filled 2023-10-25: qty 250

## 2023-10-25 MED ORDER — DOCUSATE SODIUM 100 MG PO CAPS
100.0000 mg | ORAL_CAPSULE | Freq: Two times a day (BID) | ORAL | Status: DC
  Administered 2023-10-25 – 2023-10-26 (×2): 100 mg via ORAL
  Filled 2023-10-25 (×2): qty 1

## 2023-10-25 MED ORDER — MECLIZINE HCL 25 MG PO TABS: 25.0000 mg | ORAL_TABLET | Freq: Three times a day (TID) | ORAL | Status: DC | PRN

## 2023-10-25 MED ORDER — METHOCARBAMOL 500 MG PO TABS
500.0000 mg | ORAL_TABLET | Freq: Four times a day (QID) | ORAL | Status: DC | PRN
  Administered 2023-10-25 – 2023-10-26 (×2): 500 mg via ORAL
  Filled 2023-10-25 (×2): qty 1

## 2023-10-25 MED ORDER — PROPOFOL 10 MG/ML IV BOLUS
INTRAVENOUS | Status: AC
  Filled 2023-10-25: qty 20

## 2023-10-25 MED ORDER — ONDANSETRON HCL 4 MG/2ML IJ SOLN: 4.0000 mg | Freq: Once | INTRAMUSCULAR | Status: DC | PRN

## 2023-10-25 MED ORDER — THROMBIN 5000 UNITS EX SOLR
OROMUCOSAL | Status: DC | PRN
  Administered 2023-10-25 (×2): 5 mL via TOPICAL

## 2023-10-25 MED ORDER — AZELASTINE HCL 0.1 % NA SOLN: 2.0000 | Freq: Two times a day (BID) | NASAL | Status: DC | PRN

## 2023-10-25 MED ORDER — ACETAMINOPHEN 10 MG/ML IV SOLN
INTRAVENOUS | Status: DC | PRN
  Administered 2023-10-25: 1000 mg via INTRAVENOUS

## 2023-10-25 MED ORDER — OXYCODONE-ACETAMINOPHEN 5-325 MG PO TABS
1.0000 | ORAL_TABLET | Freq: Four times a day (QID) | ORAL | Status: DC | PRN
  Administered 2023-10-25: 2 via ORAL
  Filled 2023-10-25 (×2): qty 2

## 2023-10-25 MED ORDER — OXYCODONE HCL 5 MG/5ML PO SOLN
5.0000 mg | Freq: Once | ORAL | Status: AC
  Administered 2023-10-25: 5 mg via ORAL

## 2023-10-25 MED ORDER — METHOCARBAMOL 1000 MG/10ML IJ SOLN: 500.0000 mg | Freq: Four times a day (QID) | INTRAMUSCULAR | Status: DC | PRN

## 2023-10-25 MED ORDER — KETAMINE HCL 50 MG/5ML IJ SOSY
PREFILLED_SYRINGE | INTRAMUSCULAR | Status: AC
  Filled 2023-10-25: qty 5

## 2023-10-25 MED ORDER — FENTANYL CITRATE (PF) 100 MCG/2ML IJ SOLN
INTRAMUSCULAR | Status: AC
Start: 2023-10-25 — End: 2023-10-25
  Filled 2023-10-25: qty 2

## 2023-10-25 MED ORDER — LACTATED RINGERS IV SOLN: INTRAVENOUS | Status: DC

## 2023-10-25 MED ORDER — SODIUM CHLORIDE 0.9% FLUSH
3.0000 mL | Freq: Two times a day (BID) | INTRAVENOUS | Status: DC
  Administered 2023-10-25: 3 mL via INTRAVENOUS

## 2023-10-25 MED ORDER — ONDANSETRON HCL 4 MG/2ML IJ SOLN: 4.0000 mg | Freq: Four times a day (QID) | INTRAMUSCULAR | Status: DC | PRN

## 2023-10-25 MED ORDER — ALBUTEROL SULFATE (2.5 MG/3ML) 0.083% IN NEBU: 3.0000 mL | INHALATION_SOLUTION | RESPIRATORY_TRACT | Status: DC | PRN

## 2023-10-25 MED ORDER — FENTANYL CITRATE (PF) 100 MCG/2ML IJ SOLN
25.0000 ug | INTRAMUSCULAR | Status: DC | PRN
  Administered 2023-10-25 (×2): 25 ug via INTRAVENOUS

## 2023-10-25 MED ORDER — MIDAZOLAM HCL 2 MG/2ML IJ SOLN
INTRAMUSCULAR | Status: AC
Start: 2023-10-25 — End: 2023-10-25
  Filled 2023-10-25: qty 2

## 2023-10-25 MED ORDER — LIDOCAINE-EPINEPHRINE 1 %-1:100000 IJ SOLN
INTRAMUSCULAR | Status: DC | PRN
  Administered 2023-10-25: 10 mL

## 2023-10-25 MED ORDER — CEFAZOLIN SODIUM-DEXTROSE 2-4 GM/100ML-% IV SOLN
2.0000 g | Freq: Three times a day (TID) | INTRAVENOUS | Status: AC
  Administered 2023-10-25 – 2023-10-26 (×2): 2 g via INTRAVENOUS
  Filled 2023-10-25 (×2): qty 100

## 2023-10-25 MED ORDER — ALBUMIN HUMAN 5 % IV SOLN
12.5000 g | Freq: Once | INTRAVENOUS | Status: AC
  Administered 2023-10-25: 12.5 g via INTRAVENOUS

## 2023-10-25 MED ORDER — DEXAMETHASONE SODIUM PHOSPHATE 10 MG/ML IJ SOLN
INTRAMUSCULAR | Status: AC
  Filled 2023-10-25: qty 1

## 2023-10-25 MED ORDER — ROSUVASTATIN CALCIUM 5 MG PO TABS
5.0000 mg | ORAL_TABLET | Freq: Every day | ORAL | Status: DC
  Administered 2023-10-26: 5 mg via ORAL
  Filled 2023-10-25: qty 1

## 2023-10-25 MED ORDER — LIDOCAINE-EPINEPHRINE 1 %-1:100000 IJ SOLN
INTRAMUSCULAR | Status: AC
  Filled 2023-10-25: qty 1

## 2023-10-25 MED ORDER — CHLORHEXIDINE GLUCONATE 0.12 % MT SOLN
15.0000 mL | Freq: Once | OROMUCOSAL | Status: AC
  Administered 2023-10-25: 15 mL via OROMUCOSAL
  Filled 2023-10-25: qty 15

## 2023-10-25 MED ORDER — PROPOFOL 10 MG/ML IV BOLUS
INTRAVENOUS | Status: DC | PRN
  Administered 2023-10-25: 100 mg via INTRAVENOUS
  Administered 2023-10-25: 50 mg via INTRAVENOUS

## 2023-10-25 MED ORDER — PHENYLEPHRINE 80 MCG/ML (10ML) SYRINGE FOR IV PUSH (FOR BLOOD PRESSURE SUPPORT)
PREFILLED_SYRINGE | INTRAVENOUS | Status: DC | PRN
  Administered 2023-10-25 (×9): 80 ug via INTRAVENOUS

## 2023-10-25 MED ORDER — SUGAMMADEX SODIUM 200 MG/2ML IV SOLN
INTRAVENOUS | Status: DC | PRN
  Administered 2023-10-25: 200 mg via INTRAVENOUS

## 2023-10-25 MED ORDER — FLEET ENEMA RE ENEM: 1.0000 | ENEMA | Freq: Once | RECTAL | Status: DC | PRN

## 2023-10-25 MED ORDER — HYDROMORPHONE HCL 1 MG/ML IJ SOLN
1.0000 mg | INTRAMUSCULAR | Status: DC | PRN
  Administered 2023-10-25: 1 mg via INTRAVENOUS
  Filled 2023-10-25: qty 1

## 2023-10-25 MED ORDER — DEXAMETHASONE SODIUM PHOSPHATE 10 MG/ML IJ SOLN
INTRAMUSCULAR | Status: DC | PRN
  Administered 2023-10-25: 5 mg via INTRAVENOUS

## 2023-10-25 MED ORDER — ALUM & MAG HYDROXIDE-SIMETH 200-200-20 MG/5ML PO SUSP: 30.0000 mL | Freq: Four times a day (QID) | ORAL | Status: DC | PRN

## 2023-10-25 MED ORDER — PROBIOTIC 250 MG PO CAPS: ORAL_CAPSULE | Freq: Every morning | ORAL | Status: DC

## 2023-10-25 MED ORDER — OXYCODONE HCL 5 MG/5ML PO SOLN
ORAL | Status: AC
  Filled 2023-10-25: qty 5

## 2023-10-25 MED ORDER — AMISULPRIDE (ANTIEMETIC) 5 MG/2ML IV SOLN: 10.0000 mg | Freq: Once | INTRAVENOUS | Status: DC | PRN

## 2023-10-25 MED ORDER — MENTHOL 3 MG MT LOZG: 1.0000 | LOZENGE | OROMUCOSAL | Status: DC | PRN

## 2023-10-25 MED ORDER — GLYBURIDE 2.5 MG PO TABS
2.5000 mg | ORAL_TABLET | Freq: Every day | ORAL | Status: DC
  Administered 2023-10-26: 2.5 mg via ORAL
  Filled 2023-10-25 (×2): qty 1

## 2023-10-25 MED ORDER — ONDANSETRON HCL 4 MG/2ML IJ SOLN
INTRAMUSCULAR | Status: DC | PRN
  Administered 2023-10-25: 4 mg via INTRAVENOUS

## 2023-10-25 MED ORDER — FLUTICASONE PROPIONATE 50 MCG/ACT NA SUSP
2.0000 | Freq: Every day | NASAL | Status: DC
  Filled 2023-10-25: qty 16

## 2023-10-25 MED ORDER — MIDAZOLAM HCL 2 MG/2ML IJ SOLN
INTRAMUSCULAR | Status: DC | PRN
  Administered 2023-10-25: 2 mg via INTRAVENOUS

## 2023-10-25 MED ORDER — ORAL CARE MOUTH RINSE: 15.0000 mL | Freq: Once | OROMUCOSAL | Status: AC

## 2023-10-25 MED ORDER — SODIUM CHLORIDE 0.9 % IV SOLN: INTRAVENOUS | Status: DC | PRN

## 2023-10-25 MED ORDER — LIDOCAINE 2% (20 MG/ML) 5 ML SYRINGE
INTRAMUSCULAR | Status: AC
  Filled 2023-10-25: qty 5

## 2023-10-25 MED ORDER — RISAQUAD PO CAPS
1.0000 | ORAL_CAPSULE | Freq: Every day | ORAL | Status: DC
  Administered 2023-10-26: 1 via ORAL
  Filled 2023-10-25: qty 1

## 2023-10-25 MED ORDER — CEFAZOLIN SODIUM-DEXTROSE 2-4 GM/100ML-% IV SOLN
2.0000 g | INTRAVENOUS | Status: AC
  Administered 2023-10-25 (×2): 2 g via INTRAVENOUS
  Filled 2023-10-25: qty 100

## 2023-10-25 MED ORDER — ALBUMIN HUMAN 5 % IV SOLN: INTRAVENOUS | Status: DC | PRN

## 2023-10-25 MED ORDER — SODIUM CHLORIDE 0.9% FLUSH: 3.0000 mL | INTRAVENOUS | Status: DC | PRN

## 2023-10-25 MED ORDER — BISACODYL 10 MG RE SUPP
10.0000 mg | Freq: Every day | RECTAL | Status: DC | PRN
Start: 2023-10-25 — End: 2023-10-26

## 2023-10-25 MED ORDER — PHENYLEPHRINE HCL-NACL 20-0.9 MG/250ML-% IV SOLN
INTRAVENOUS | Status: DC | PRN
  Administered 2023-10-25: 40 ug/min via INTRAVENOUS

## 2023-10-25 MED ORDER — POLYETHYLENE GLYCOL 3350 17 G PO PACK: 17.0000 g | PACK | Freq: Every day | ORAL | Status: DC | PRN

## 2023-10-25 MED ORDER — ACETAMINOPHEN 10 MG/ML IV SOLN
INTRAVENOUS | Status: AC
  Filled 2023-10-25: qty 100

## 2023-10-25 MED ORDER — BUPIVACAINE HCL (PF) 0.5 % IJ SOLN
INTRAMUSCULAR | Status: DC | PRN
  Administered 2023-10-25: 10 mL

## 2023-10-25 MED ORDER — THROMBIN 5000 UNITS EX KIT
PACK | CUTANEOUS | Status: AC
  Filled 2023-10-25: qty 1

## 2023-10-25 MED ORDER — BUPIVACAINE HCL (PF) 0.5 % IJ SOLN
INTRAMUSCULAR | Status: AC
  Filled 2023-10-25: qty 30

## 2023-10-25 MED ORDER — FENTANYL CITRATE (PF) 250 MCG/5ML IJ SOLN
INTRAMUSCULAR | Status: DC | PRN
  Administered 2023-10-25: 50 ug via INTRAVENOUS
  Administered 2023-10-25: 100 ug via INTRAVENOUS

## 2023-10-25 MED ORDER — KETAMINE HCL 10 MG/ML IJ SOLN
INTRAMUSCULAR | Status: DC | PRN
  Administered 2023-10-25 (×2): 10 mg via INTRAVENOUS
  Administered 2023-10-25: 30 mg via INTRAVENOUS

## 2023-10-25 MED ORDER — ONDANSETRON HCL 4 MG PO TABS: 4.0000 mg | ORAL_TABLET | Freq: Four times a day (QID) | ORAL | Status: DC | PRN

## 2023-10-25 MED ORDER — LIDOCAINE 2% (20 MG/ML) 5 ML SYRINGE
INTRAMUSCULAR | Status: DC | PRN
  Administered 2023-10-25: 60 mg via INTRAVENOUS

## 2023-10-25 MED ORDER — ONDANSETRON HCL 4 MG/2ML IJ SOLN
INTRAMUSCULAR | Status: AC
  Filled 2023-10-25: qty 2

## 2023-10-25 MED ORDER — METFORMIN HCL ER 500 MG PO TB24
1000.0000 mg | ORAL_TABLET | Freq: Two times a day (BID) | ORAL | Status: DC
  Administered 2023-10-26: 1000 mg via ORAL
  Filled 2023-10-25: qty 2

## 2023-10-25 MED ORDER — ACETAMINOPHEN 650 MG RE SUPP: 650.0000 mg | RECTAL | Status: DC | PRN

## 2023-10-25 MED ORDER — SODIUM CHLORIDE 0.9 % IV SOLN: 10.0000 mL/h | Freq: Once | INTRAVENOUS | Status: AC

## 2023-10-25 SURGICAL SUPPLY — 61 items
BAG COUNTER SPONGE SURGICOUNT (BAG) ×1 IMPLANT
BASKET BONE COLLECTION (BASKET) ×1 IMPLANT
BLADE BONE MILL MEDIUM (MISCELLANEOUS) ×1 IMPLANT
BLADE CLIPPER SURG (BLADE) IMPLANT
BUR MATCHSTICK NEURO 3.0 LAGG (BURR) ×1 IMPLANT
CAGE COROENT LG 10X9X23-12 (Cage) IMPLANT
CAGE PLIF 8X9X23-12 LUMBAR (Cage) IMPLANT
CANISTER SUCTION 3000ML PPV (SUCTIONS) ×1 IMPLANT
CNTNR URN SCR LID CUP LEK RST (MISCELLANEOUS) ×1 IMPLANT
COVER BACK TABLE 60X90IN (DRAPES) ×1 IMPLANT
DERMABOND ADVANCED .7 DNX12 (GAUZE/BANDAGES/DRESSINGS) ×1 IMPLANT
DEVICE DISSECT PLASMABLAD 3.0S (MISCELLANEOUS) ×1 IMPLANT
DRAPE C-ARM 42X72 X-RAY (DRAPES) ×2 IMPLANT
DRAPE HALF SHEET 40X57 (DRAPES) IMPLANT
DRAPE LAPAROTOMY 100X72X124 (DRAPES) ×1 IMPLANT
DURAPREP 26ML APPLICATOR (WOUND CARE) ×1 IMPLANT
DURASEAL APPLICATOR TIP (TIP) IMPLANT
DURASEAL SPINE SEALANT 3ML (MISCELLANEOUS) IMPLANT
ELECTRODE REM PT RTRN 9FT ADLT (ELECTROSURGICAL) ×1 IMPLANT
GAUZE 4X4 16PLY ~~LOC~~+RFID DBL (SPONGE) IMPLANT
GAUZE SPONGE 4X4 12PLY STRL (GAUZE/BANDAGES/DRESSINGS) ×1 IMPLANT
GLOVE BIOGEL PI IND STRL 8.5 (GLOVE) ×2 IMPLANT
GLOVE ECLIPSE 8.5 STRL (GLOVE) ×2 IMPLANT
GOWN STRL REUS W/ TWL LRG LVL3 (GOWN DISPOSABLE) IMPLANT
GOWN STRL REUS W/ TWL XL LVL3 (GOWN DISPOSABLE) IMPLANT
GOWN STRL REUS W/TWL 2XL LVL3 (GOWN DISPOSABLE) ×2 IMPLANT
GRAFT BONE PROTEIOS LRG 5CC (Orthopedic Implant) IMPLANT
HEMOSTAT POWDER KIT SURGIFOAM (HEMOSTASIS) ×1 IMPLANT
KIT BASIN OR (CUSTOM PROCEDURE TRAY) ×1 IMPLANT
KIT GRAFTMAG DEL NEURO DISP (NEUROSURGERY SUPPLIES) IMPLANT
KIT TURNOVER KIT B (KITS) ×1 IMPLANT
MILL BONE PREP (MISCELLANEOUS) ×1 IMPLANT
NDL HYPO 22X1.5 SAFETY MO (MISCELLANEOUS) ×1 IMPLANT
NDL SPNL 18GX3.5 QUINCKE PK (NEEDLE) IMPLANT
NEEDLE HYPO 22X1.5 SAFETY MO (MISCELLANEOUS) ×1 IMPLANT
NEEDLE SPNL 18GX3.5 QUINCKE PK (NEEDLE) IMPLANT
NS IRRIG 1000ML POUR BTL (IV SOLUTION) ×1 IMPLANT
PACK LAMINECTOMY NEURO (CUSTOM PROCEDURE TRAY) ×1 IMPLANT
PAD ARMBOARD POSITIONER FOAM (MISCELLANEOUS) ×3 IMPLANT
PATTIES SURGICAL .5 X.5 (GAUZE/BANDAGES/DRESSINGS) ×1 IMPLANT
PATTIES SURGICAL .5 X1 (DISPOSABLE) ×1 IMPLANT
PATTIES SURGICAL 1X1 (DISPOSABLE) ×1 IMPLANT
ROD RELINE-O LORDOTIC 5.5X60MM (Rod) IMPLANT
SCREW LOCK RELINE 5.5 TULIP (Screw) IMPLANT
SCREW RELINE 2FS POLY 6.5X45 (Screw) IMPLANT
SPIKE FLUID TRANSFER (MISCELLANEOUS) ×1 IMPLANT
SPONGE SURGIFOAM ABS GEL 100 (HEMOSTASIS) IMPLANT
SPONGE T-LAP 4X18 ~~LOC~~+RFID (SPONGE) IMPLANT
SUT PROLENE 6 0 BV (SUTURE) IMPLANT
SUT VIC AB 1 CT1 18XBRD ANBCTR (SUTURE) ×1 IMPLANT
SUT VIC AB 2-0 CP2 18 (SUTURE) ×1 IMPLANT
SUT VIC AB 3-0 SH 8-18 (SUTURE) ×1 IMPLANT
SUT VIC AB 4-0 RB1 18 (SUTURE) ×1 IMPLANT
SYR 30ML SLIP (SYRINGE) ×1 IMPLANT
SYR 3ML LL SCALE MARK (SYRINGE) ×4 IMPLANT
SYR 5ML LL (SYRINGE) IMPLANT
TAPE CLOTH SURG 4X10 WHT LF (GAUZE/BANDAGES/DRESSINGS) IMPLANT
TOWEL GREEN STERILE (TOWEL DISPOSABLE) ×1 IMPLANT
TOWEL GREEN STERILE FF (TOWEL DISPOSABLE) ×1 IMPLANT
TRAY FOLEY MTR SLVR 16FR STAT (SET/KITS/TRAYS/PACK) ×1 IMPLANT
WATER STERILE IRR 1000ML POUR (IV SOLUTION) ×1 IMPLANT

## 2023-10-25 NOTE — Op Note (Signed)
 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 Postoperative diagnosis: Same Procedure: Bilateral laminectomies and decompression of L4-5 and L5-S1 with more work than required for simple interbody technique.  Posterior lumbar interbody arthrodesis L4-5 and L5-S1 with local autograft and Proteus, peek spacers pedicle screw fixation L4 to the sacrum with with posterolateral arthrodesis using local autograft and Proteus.  Fluoroscopic guidance. Surgeon: Victory Gens Anesthesia: General endotracheal Indications: Reginald Rios is a 68 year old individual who has had significant back and bilateral lower extremity pain and weakness.  He has advanced spondylitic disease with significant stenosis at L4-5 and also at L5-S1 where there is a degenerative spondylolisthesis.  He has been advised regarding the need for surgical decompression and stabilization.  Procedure: The patient was brought to the operating room supine on the stretcher.  After the smooth induction of general endotracheal anesthesia, he was carefully turned prone.  The back was prepped with alcohol DuraPrep and draped in a sterile fashion.  Midline incision was created and carried down to the lumbodorsal fascia which was opened on either side of midline to expose the L5 L4 and the sacral regions.  The dissection was carried over the lamina and facets at L4-5 and L5-S1 and localizing radiographs identified exact level of surgery to confirm L4-5 and L5-S1.  Once an adequate decompression was identified it was noted that there was during this time a significant amount of chronic oozing of blood the patient had been on aspirin  and Plavix  and stopped his Plavix  approximately a week ago but continue to take his aspirin  as required by his cardiologist.  Throughout the case we battled continuous low-grade ooze.  We then performed a laminectomy  removing the inferior margin lamina of L4 out to and including the facet joint at L4-5.  A complete laminectomy of L5 was performed exposing the common dural tube the L5 nerve root superiorly and the S1 nerve roots inferiorly.  These were then individually decompressed and hemostasis was carefully and meticulously obtained from the bleeding epidural veins.  Once the nerve roots were isolated the disc spaces were then isolated at L4-5 and L5-S1.  Complete discectomy was then performed by removing the osteophytic overgrowth posteriorly and entering the disc space and using a distraction on 1 side to facilitate complete evacuation of the disc at both L4-5 and L5-S1.  Once complete discectomy was performed at the L5-S1 level a 10 x 23 mm spacer measuring 12 degrees in the lordosis was placed into the interspace on each side filled with autograft and Proteus.  An additional 9 cc of bone graft was placed into the interspace at the L5-S1 level.  Then at L4-L5 an 8 x 12 mm spacer 23 mm on the left with 12 degrees of lordosis was placed into the interspace on the left and on the right along with an additional 9 cc of bone graft.  Once the interbody grafting was completed the lateral gutters which had been decorticated were packed with an additional 9 cc of bone graft on each side.  Pedicle entry sites were then chosen fluoroscopically at L4-L5 and the sacrum.  6.5 x 45 mm pedicle screws were placed in a neutral construct knees were connected with 260 mm precontoured rods.  In the end care was taken to make sure good hemostasis was achieved however during the case continuous ooze led to 1200 cc blood loss.  300 cc of Cell Saver blood was returned to the  patient and patient was given 1 unit of packed cells in the operating room on his way to the recovery room.  Patient's vital signs are maintained stable.  The back was closed with 0 Vicryl in the lumbodorsal fascia 2-0 Vicryl in the subcutaneous tissues 3-0 Vicryl  subcuticularly.  Dermabond was placed on the skin.  Dry sterile dressing was replaced over this.  Patient was turned supine and returned to the recovery room stable.

## 2023-10-25 NOTE — Anesthesia Procedure Notes (Addendum)
 Procedure Name: Intubation Date/Time: 10/25/2023 7:56 AM  Performed by: Tidwell, Tess, RNPre-anesthesia Checklist: Patient identified, Emergency Drugs available, Suction available and Patient being monitored Patient Re-evaluated:Patient Re-evaluated prior to induction Oxygen Delivery Method: Circle System Utilized Preoxygenation: Pre-oxygenation with 100% oxygen Induction Type: IV induction Ventilation: Mask ventilation without difficulty and Oral airway inserted - appropriate to patient size Laryngoscope Size: Glidescope and 3 Grade View: Grade I Tube type: Oral Tube size: 7.5 mm Number of attempts: 1 Airway Equipment and Method: Stylet and Oral airway Placement Confirmation: ETT inserted through vocal cords under direct vision, positive ETCO2 and breath sounds checked- equal and bilateral Secured at: 24 cm Tube secured with: Tape Dental Injury: Teeth and Oropharynx as per pre-operative assessment

## 2023-10-25 NOTE — H&P (Signed)
 Reginald Rios is an 68 y.o. male.   Chief Complaint: Back pain bilateral leg pain and weakness HPI: [name], [name] is a 18 year old individual who has had significant degenerative changes in his lower lumbar spine for a number of years.  I treated him initially with epidural spinal injections followed by physical therapy but he has recently developed refractoriness to this intervention he has a spondylolisthesis at the level of L5-S1 he also has a high-grade stenosis at L4-L5 due to advanced disc degenerative changes.  After careful assessment I advised that he should undergo surgical decompression and stabilization from L4 to the sacrum.  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 No issues now   Hemarthrosis of right elbow 02/03/2016   Hoarseness 04/13/2019   Hyperlipidemia    family hx of high cholesterol   Hypertension    Hypogonadism in male    Incomplete rotator cuff tear 09/10/2015   Injected 09/10/2015    Lateral epicondylitis of right elbow 07/28/2019   Low back pain at multiple sites 02/05/2021   Low vitamin D  level 04/22/2017   Mild persistent asthma/cough variant asthma 04/13/2019   Myofascial pain syndrome, cervical 07/28/2019   Neck pain 06/01/2018   Nonischemic cardiomyopathy (HCC) 05/15/2015   Nontraumatic incomplete tear of right rotator cuff 07/25/2019   Occipital headache 06/01/2018   Onychomycosis 05/25/2018   Osteoarthritis    Pain in right ankle and joints of right foot 10/05/2016   Palpitations 07/06/2017   Pernicious anemia    PONV (postoperative nausea and vomiting)    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   Shoulder bursitis 10/12/2014   Injected in 10/12/2014  Repeat 01/21/2015   Small thenar eminence 02/03/2016   Spinal stenosis, lumbar region, without neurogenic claudication 10/12/2014   Spondylolisthesis at L5-S1 level 05/09/2021   Strain of latissimus dorsi muscle 01/21/2015   Tendinopathy of right biceps tendon 07/25/2019   Type 2 diabetes mellitus with hyperglycemia, without long-term current use of insulin  (HCC) 12/22/2013   Ulnar neuropathy at elbow of right upper extremity 01/03/2019    Past Surgical History:  Procedure Laterality Date   CATARACT EXTRACTION W/ INTRAOCULAR LENS IMPLANT Bilateral 2023   COLONOSCOPY     CORONARY PRESSURE/FFR STUDY N/A 06/23/2017   Procedure: INTRAVASCULAR PRESSURE WIRE/FFR STUDY;  Surgeon: Anner Alm ORN, MD;  Location: [facility] INVASIVE CV LAB;  Service: Cardiovascular;  Laterality: N/A;   CORONARY STENT INTERVENTION N/A 06/23/2017   Procedure: CORONARY STENT INTERVENTION;  Surgeon: Anner Alm ORN, MD;  Location: [facility] INVASIVE CV LAB;  Service:  Cardiovascular;  Laterality: N/A;   KNEE ARTHROSCOPY Left    LEFT HEART CATH AND CORONARY ANGIOGRAPHY N/A 06/23/2017   Procedure: LEFT HEART CATH AND CORONARY ANGIOGRAPHY;  Surgeon: Anner Alm ORN, MD;  Location: [facility] INVASIVE CV LAB;  Service: Cardiovascular;  Laterality: N/A;   LEFT HEART  CATH AND CORONARY ANGIOGRAPHY N/A 07/14/2023   Procedure: LEFT HEART CATH AND CORONARY ANGIOGRAPHY;  Surgeon: Elmira Newman PARAS, MD;  Location: [facility] INVASIVE CV LAB;  Service: Cardiovascular;  Laterality: N/A;   SHOULDER ARTHROSCOPY WITH BICEPSTENOTOMY Right 09/06/2019   Procedure: SHOULDER ARTHROSCOPY WITH BICEPSTENOTOMY;  Surgeon: Jerri Kay HERO, MD;  Location: [facility];  Service: Orthopedics;  Laterality: Right;   SHOULDER ARTHROSCOPY WITH SUBACROMIAL DECOMPRESSION Right 09/06/2019   Procedure: RIGHT SHOULDER ARTHROSCOPY WITH EXTENSIVE DEBRIDEMENT, SUBACROMIAL DECOMPRESSION, BICEPS TENOTOMY;  Surgeon: Jerri Kay HERO, MD;  Location: [facility];  Service: Orthopedics;  Laterality: Right;   TENOTOMY ACHILLES TENDON  2007   In [place], [place]   ULNAR NERVE TRANSPOSITION  2006   In [place], [place]    Family History  Problem Relation Age of Onset   Lung cancer Mother 52   Cancer Mother        lung cancer   Coronary artery disease Father 53   Heart disease Father    Hypothyroidism Brother    Food Allergy Son    Asthma Paternal Aunt    Colon cancer Neg Hx    Prostate cancer Neg Hx    Allergic rhinitis Neg Hx    Angioedema Neg Hx    Eczema Neg Hx    Immunodeficiency Neg Hx    Urticaria Neg Hx    Social History:  reports that he has never smoked. He has never been exposed to tobacco smoke. He has never used smokeless tobacco. He reports that he does not currently use alcohol. He reports that he does not use drugs.  Allergies:  Allergies  Allergen Reactions   Invokana  [Canagliflozin ] Rash   Grass Pollen(K-O-R-T-Swt Vern)    Lisinopril  Cough   Semaglutide  Nausea Only    Causes cramping. Patient is intolerant to Rybelsus     Medications Prior to Admission  Medication Sig Dispense Refill   acetaminophen  (TYLENOL ) 500 MG tablet Take 1,000 mg by mouth every 6 (six) hours as needed for moderate pain (pain score 4-6).     amLODipine  (NORVASC ) 2.5 MG tablet  Take 1 tablet (2.5 mg total) by mouth daily. 90 tablet 3   azelastine  (ASTELIN ) 0.1 % nasal spray Place 2 sprays into both nostrils 2 (two) times daily as needed for rhinitis (nasal congestion). (Patient taking differently: Place 2 sprays into both nostrils at bedtime.) 30 mL 3   clopidogrel  (PLAVIX ) 75 MG tablet Take 1 tablet (75 mg total) by mouth daily. 90 tablet 2   diclofenac  Sodium (VOLTAREN ) 1 % GEL Apply 2 g topically 4 (four) times daily. (Patient taking differently: Apply 2 g topically 4 (four) times daily as needed (pain).) 100 g 0   empagliflozin  (JARDIANCE ) 25 MG TABS tablet Take 1 tablet (25 mg total) by mouth daily before breakfast. 90 tablet 1   ezetimibe  (ZETIA ) 10 MG tablet Take 1 tablet (10 mg total) by mouth daily. 90 tablet 2   fluticasone  (FLONASE ) 50 MCG/ACT nasal spray Place 2 sprays into both nostrils daily. 16 g 6   Glucagon  (GVOKE HYPOPEN  1-PACK) 1 MG/0.2ML SOAJ Inject into the skin in the event of low sugar. 0.2 mL 2   glyBURIDE  (DIABETA ) 2.5  MG tablet Take 1 tablet (2.5 mg total) by mouth daily with breakfast. (Patient taking differently: Take 2.5 mg by mouth daily as needed (elevated blood sugar).) 30 tablet 2   losartan  (COZAAR ) 25 MG tablet Take 1 tablet (25 mg total) by mouth daily. 90 tablet 2   meclizine  (ANTIVERT ) 25 MG tablet Take 1 tablet (25 mg total) by mouth 3 (three) times daily as needed for dizziness. 30 tablet 0   metFORMIN  (GLUCOPHAGE -XR) 500 MG 24 hr tablet Take 2 tablets (1,000 mg total) by mouth 2 (two) times daily with a meal. 120 tablet 5   methylphenidate  (RITALIN ) 20 MG tablet Take 1 tablet (20 mg total) by mouth 2 (two) times daily. 60 tablet 0   methylphenidate  (RITALIN ) 20 MG tablet Take 1 tablet (20 mg total) by mouth 2 (two) times daily. 60 tablet 0   montelukast  (SINGULAIR ) 10 MG tablet Take 1 tablet (10 mg total) by mouth daily. 90 tablet 3   Multiple Vitamin (MULTIVITAMIN WITH MINERALS) TABS tablet Take 1 tablet by mouth in the morning. One  A Day     nitroGLYCERIN  (NITROSTAT ) 0.4 MG SL tablet Place 0.4 mg under the tongue every 5 (five) minutes x 3 doses as needed for chest pain.     pantoprazole  (PROTONIX ) 40 MG tablet Take 1 tablet (40 mg total) by mouth daily. 90 tablet 0   pioglitazone  (ACTOS ) 45 MG tablet Take 1 tablet (45 mg total) by mouth daily. 90 tablet 1   Probiotic Product (PROBIOTIC PO) Take 2 capsules by mouth in the morning.     rosuvastatin  (CRESTOR ) 5 MG tablet Take 1 tablet (5 mg total) by mouth daily. 90 tablet 3   testosterone  (ANDROGEL ) 50 MG/5GM (1%) GEL Apply 2 applications onto the skin daily. 300 g 5   Vitamin D , Ergocalciferol , (DRISDOL ) 1.25 MG (50000 UNIT) CAPS capsule Take 1 capsule (50,000 Units total) by mouth every 7 (seven) days. (Patient taking differently: Take 50,000 Units by mouth every Friday.) 12 capsule 1   albuterol  (VENTOLIN  HFA) 108 (90 Base) MCG/ACT inhaler Inhale 2 puffs into the lungs every 4 (four) to 6 (six) hours as needed for coughing or wheezing spells. 6.7 g 1   Blood Glucose Monitoring Suppl (FREESTYLE FREEDOM LITE) w/Device KIT Use to check blood sugar daily. 1 kit 0   glucose blood (FREESTYLE LITE) test strip Use daily to check blood sugar. 100 each 3   glucose blood (FREESTYLE LITE) test strip Use daily to check blood sugar. 100 each 3   Lancets (FREESTYLE) lancets Use daily to check blood sugar 100 each 3   methylphenidate  (RITALIN ) 20 MG tablet Take 1 tablet (20 mg total) by mouth 2 (two) times daily. 60 tablet 0   sildenafil  (VIAGRA ) 100 MG tablet Take 1 tablet (100 mg total) by mouth daily as needed. Take 1 hour prior to sexual activity 20 tablet 3    Results for orders placed or performed during the hospital encounter of 10/25/23 (from the past 48 hours)  Glucose, capillary     Status: Abnormal   Collection Time: 10/25/23  6:01 AM  Result Value Ref Range   Glucose-Capillary 148 (H) 70 - 99 mg/dL    Comment: Glucose reference range applies only to samples taken after  fasting for at least 8 hours.   Comment 1 Notify RN    *Note: Due to a large number of results and/or encounters for the requested time period, some results have not been displayed. A complete set of  results can be found in Results Review.   No results found.  Review of Systems  Constitutional:  Positive for activity change.  Musculoskeletal:  Positive for back pain, gait problem and myalgias.  Neurological:  Positive for weakness and numbness.  All other systems reviewed and are negative.   Blood pressure 115/64, pulse 75, temperature 98.8 F (37.1 C), temperature source Oral, resp. rate 18, height 5' 10 (1.778 m), weight 88.5 kg, SpO2 98%. Physical Exam Constitutional:      Appearance: Normal appearance. He is normal weight.  HENT:     Head: Normocephalic and atraumatic.     Right Ear: Tympanic membrane, ear canal and external ear normal.     Left Ear: Tympanic membrane, ear canal and external ear normal.     Nose: Nose normal.     Mouth/Throat:     Mouth: Mucous membranes are moist.     Pharynx: Oropharynx is clear.   Eyes:     Extraocular Movements: Extraocular movements intact.     Conjunctiva/sclera: Conjunctivae normal.     Pupils: Pupils are equal, round, and reactive to light.    Cardiovascular:     Rate and Rhythm: Normal rate and regular rhythm.     Pulses: Normal pulses.     Heart sounds: Normal heart sounds.  Pulmonary:     Effort: Pulmonary effort is normal.     Breath sounds: Normal breath sounds.  Abdominal:     General: Abdomen is flat.     Palpations: Abdomen is soft.   Musculoskeletal:     Comments: Positive straight leg raising at 30 degrees in either lower extremity Patrick's maneuver is negative bilaterally   Skin:    General: Skin is warm and dry.     Capillary Refill: Capillary refill takes less than 2 seconds.   Neurological:     Mental Status: He is alert.     Comments: Cranial nerve examination is within the limits of normal upper  extremity strength is normal as are reflexes lower extremity strength reveals tibialis anterior weakness to 4- out of 5 bilaterally slightly worse on the left than on the right sensation is diminished slightly on the dorsum of the left leg compared to the right.  Psychiatric:        Mood and Affect: Mood normal.        Behavior: Behavior normal.        Thought Content: Thought content normal.        Judgment: Judgment normal.      Assessment/Plan Spondylolisthesis L5-S1 stenosis and spondylosis L4-L5 with lumbar radiculopathy.  Plan: Posterior interbody decompression arthrodesis L4-5 and L5-S1 with pedicle screw fixation L4 to the sacrum.  Victory JINNY Gens, MD 10/25/2023, 7:34 AM

## 2023-10-25 NOTE — Progress Notes (Signed)
 Moving to the chair.

## 2023-10-25 NOTE — Transfer of Care (Signed)
 Immediate Anesthesia Transfer of Care Note  Patient: Reginald Rios  Procedure(s) Performed: Posterior Lumbar Interbody Fusion, Posterior Lateral and Interbody fusion - Lumbar Four-Lumbar Five - Lumbar Five-Sacral One (Back)  Patient Location: PACU  Anesthesia Type:General  Level of Consciousness: awake, alert , and patient cooperative  Airway & Oxygen Therapy: Patient Spontanous Breathing and Patient connected to face mask oxygen  Post-op Assessment: Report given to RN, Post -op Vital signs reviewed and stable, and Patient moving all extremities  Post vital signs: Reviewed and stable  Last Vitals:  Vitals Value Taken Time  BP 101/65 10/25/23 13:30  Temp 98.2   Pulse 87 10/25/23 13:33  Resp 9 10/25/23 13:33  SpO2 100 % 10/25/23 13:33  Vitals shown include unfiled device data.  Last Pain:  Vitals:   10/25/23 0619  TempSrc:   PainSc: 6       Patients Stated Pain Goal: 4 (10/25/23 9380)  Complications: No notable events documented.

## 2023-10-25 NOTE — Progress Notes (Signed)
 Orthopedic Tech Progress Note Patient Details:  Reginald Rios April 18, 1956 [id number]  Patient's wife has his back brace per PACU RN   Patient ID: Reginald Rios, male   DOB: 07-25-1955, 68 y.o.   MRN: [id number]  Delanna LITTIE Pac 10/25/2023, 2:48 PM

## 2023-10-26 ENCOUNTER — Ambulatory Visit: Admitting: Cardiology

## 2023-10-26 ENCOUNTER — Other Ambulatory Visit (HOSPITAL_COMMUNITY): Payer: Self-pay

## 2023-10-26 DIAGNOSIS — M4727 Other spondylosis with radiculopathy, lumbosacral region: Secondary | ICD-10-CM | POA: Diagnosis not present

## 2023-10-26 DIAGNOSIS — Z7902 Long term (current) use of antithrombotics/antiplatelets: Secondary | ICD-10-CM | POA: Diagnosis not present

## 2023-10-26 DIAGNOSIS — I10 Essential (primary) hypertension: Secondary | ICD-10-CM | POA: Diagnosis not present

## 2023-10-26 DIAGNOSIS — J45909 Unspecified asthma, uncomplicated: Secondary | ICD-10-CM | POA: Diagnosis not present

## 2023-10-26 DIAGNOSIS — Z955 Presence of coronary angioplasty implant and graft: Secondary | ICD-10-CM | POA: Diagnosis not present

## 2023-10-26 DIAGNOSIS — Z79899 Other long term (current) drug therapy: Secondary | ICD-10-CM | POA: Diagnosis not present

## 2023-10-26 DIAGNOSIS — M4317 Spondylolisthesis, lumbosacral region: Secondary | ICD-10-CM | POA: Diagnosis not present

## 2023-10-26 DIAGNOSIS — I2510 Atherosclerotic heart disease of native coronary artery without angina pectoris: Secondary | ICD-10-CM | POA: Diagnosis not present

## 2023-10-26 DIAGNOSIS — E119 Type 2 diabetes mellitus without complications: Secondary | ICD-10-CM | POA: Diagnosis not present

## 2023-10-26 DIAGNOSIS — M48062 Spinal stenosis, lumbar region with neurogenic claudication: Secondary | ICD-10-CM | POA: Diagnosis not present

## 2023-10-26 LAB — CBC
HCT: 25.8 % — ABNORMAL LOW (ref 39.0–52.0)
Hemoglobin: 8.4 g/dL — ABNORMAL LOW (ref 13.0–17.0)
MCH: 31.6 pg (ref 26.0–34.0)
MCHC: 32.6 g/dL (ref 30.0–36.0)
MCV: 97 fL (ref 80.0–100.0)
Platelets: 144 10*3/uL — ABNORMAL LOW (ref 150–400)
RBC: 2.66 MIL/uL — ABNORMAL LOW (ref 4.22–5.81)
RDW: 17.4 % — ABNORMAL HIGH (ref 11.5–15.5)
WBC: 7.6 10*3/uL (ref 4.0–10.5)
nRBC: 0 % (ref 0.0–0.2)

## 2023-10-26 LAB — TYPE AND SCREEN
ABO/RH(D): O POS
Antibody Screen: NEGATIVE
Unit division: 0

## 2023-10-26 LAB — BASIC METABOLIC PANEL WITH GFR
Anion gap: 11 (ref 5–15)
BUN: 14 mg/dL (ref 8–23)
CO2: 21 mmol/L — ABNORMAL LOW (ref 22–32)
Calcium: 8.2 mg/dL — ABNORMAL LOW (ref 8.9–10.3)
Chloride: 106 mmol/L (ref 98–111)
Creatinine, Ser: 0.98 mg/dL (ref 0.61–1.24)
GFR, Estimated: >60 mL/min (ref >60)
Glucose, Bld: 252 mg/dL — ABNORMAL HIGH (ref 70–99)
Potassium: 3.9 mmol/L (ref 3.5–5.1)
Sodium: 138 mmol/L (ref 135–145)

## 2023-10-26 LAB — BPAM RBC
Blood Product Expiration Date: 202507152359
ISSUE DATE / TIME: 202506231340
Unit Type and Rh: 5100

## 2023-10-26 LAB — GLUCOSE, CAPILLARY: Glucose-Capillary: 169 mg/dL — ABNORMAL HIGH (ref 70–99)

## 2023-10-26 MED ORDER — EZETIMIBE 10 MG PO TABS: 10.0000 mg | ORAL_TABLET | Freq: Every day | ORAL | Status: DC

## 2023-10-26 MED ORDER — AMLODIPINE BESYLATE 5 MG PO TABS: 2.5000 mg | ORAL_TABLET | Freq: Every day | ORAL | Status: DC

## 2023-10-26 MED ORDER — PIOGLITAZONE HCL 15 MG PO TABS: 45.0000 mg | ORAL_TABLET | Freq: Every day | ORAL | Status: DC

## 2023-10-26 MED ORDER — METHOCARBAMOL 500 MG PO TABS
500.0000 mg | ORAL_TABLET | Freq: Four times a day (QID) | ORAL | 3 refills | Status: AC | PRN
Start: 2023-10-26 — End: ?
  Filled 2023-10-26: qty 30, 8d supply, fill #0

## 2023-10-26 MED ORDER — LOSARTAN POTASSIUM 25 MG PO TABS: 25.0000 mg | ORAL_TABLET | Freq: Every day | ORAL | Status: DC

## 2023-10-26 MED ORDER — METHYLPHENIDATE HCL 5 MG PO TABS: 20.0000 mg | ORAL_TABLET | Freq: Two times a day (BID) | ORAL | Status: DC

## 2023-10-26 MED ORDER — OXYCODONE-ACETAMINOPHEN 5-325 MG PO TABS
1.0000 | ORAL_TABLET | Freq: Four times a day (QID) | ORAL | 0 refills | Status: DC | PRN
  Filled 2023-10-26: qty 50, 7d supply, fill #0

## 2023-10-26 MED ORDER — OXYCODONE-ACETAMINOPHEN 5-325 MG PO TABS
1.0000 | ORAL_TABLET | ORAL | Status: DC | PRN
  Administered 2023-10-26 (×4): 2 via ORAL
  Filled 2023-10-26 (×3): qty 2

## 2023-10-26 MED ORDER — PANTOPRAZOLE SODIUM 40 MG PO TBEC: 40.0000 mg | DELAYED_RELEASE_TABLET | Freq: Every day | ORAL | Status: DC

## 2023-10-26 MED ORDER — MONTELUKAST SODIUM 10 MG PO TABS: 10.0000 mg | ORAL_TABLET | Freq: Every day | ORAL | Status: DC

## 2023-10-26 NOTE — Progress Notes (Signed)
Patient alert and oriented, voiding adequately, skin clean, dry and intact without evidence of skin break down, or symptoms of complications - no redness or edema noted, only slight tenderness at site.  Patient states pain is manageable at time of discharge. Patient has an appointment with MD in 3 weeks 

## 2023-10-26 NOTE — Plan of Care (Signed)

## 2023-10-26 NOTE — Discharge Summary (Signed)
 Physician Discharge Summary  Patient ID: Reginald Rios MRN: [id number] DOB/AGE: 70-03-1956 70 y.o.  Admit date: 10/25/2023 Discharge date: 10/26/2023  Admission Diagnoses: Spondylolisthesis L5-S1.  Lumbar stenosis.  Neurogenic claudication.  Lumbar radiculopathy.  Discharge Diagnoses: Spondylolisthesis L5-S1.  Lumbar stenosis.  Neurogenic claudication.  Lumbar radiculopathy.  Acute blood loss anemia. Principal Problem:   Spondylolisthesis at L5-S1 level   Discharged Condition: good  Hospital Course: Patient tolerated surgery well.  Consults: None  Significant Diagnostic Studies: None  Treatments: surgery: See op note  Discharge Exam: Blood pressure (!) 122/56, pulse (!) 103, temperature 99.1 F (37.3 C), temperature source Oral, resp. rate 18, height 5' 10 (1.778 m), weight 88.5 kg, SpO2 100%. Incision is clean dry Station and gait are intact.  Disposition: Discharge disposition: 01-Home or Self Care       Discharge Instructions     Call MD for:  redness, tenderness, or signs of infection (pain, swelling, redness, odor or green/yellow discharge around incision site)   Complete by: As directed    Call MD for:  severe uncontrolled pain   Complete by: As directed    Call MD for:  temperature >100.4   Complete by: As directed    Diet - low sodium heart healthy   Complete by: As directed    Discharge wound care:   Complete by: As directed    Okay to shower. Do not apply salves or appointments to incision. No heavy lifting with the upper extremities greater than 10 pounds. May resume driving when not requiring pain medication and patient feels comfortable with doing so.   Incentive spirometry RT   Complete by: As directed    Increase activity slowly   Complete by: As directed       Allergies as of 10/26/2023       Reactions   Invokana  [canagliflozin ] Rash   Grass Pollen(k-o-[id number]-t-swt Vern)    Lisinopril  Cough   Semaglutide  Nausea Only   Causes cramping.  Patient is intolerant to Rybelsus         Medication List     TAKE these medications    acetaminophen  500 MG tablet Commonly known as: TYLENOL  Take 1,000 mg by mouth every 6 (six) hours as needed for moderate pain (pain score 4-6).   albuterol  108 (90 Base) MCG/ACT inhaler Commonly known as: VENTOLIN  HFA Inhale 2 puffs into the lungs every 4 (four) to 6 (six) hours as needed for coughing or wheezing spells.   amLODipine  2.5 MG tablet Commonly known as: NORVASC  Take 1 tablet (2.5 mg total) by mouth daily.   Azelastine  HCl 137 MCG/SPRAY Soln Place 2 sprays into both nostrils 2 (two) times daily as needed for rhinitis (nasal congestion). What changed: when to take this   clopidogrel  75 MG tablet Commonly known as: PLAVIX  Take 1 tablet (75 mg total) by mouth daily.   diclofenac  Sodium 1 % Gel Commonly known as: VOLTAREN  Apply 2 g topically 4 (four) times daily. What changed:  when to take this reasons to take this   ezetimibe  10 MG tablet Commonly known as: ZETIA  Take 1 tablet (10 mg total) by mouth daily.   fluticasone  50 MCG/ACT nasal spray Commonly known as: FLONASE  Place 2 sprays into both nostrils daily.   FreeStyle Freedom Lite w/Device Kit Use to check blood sugar daily.   freestyle lancets Use daily to check blood sugar   FREESTYLE LITE test strip Generic drug: glucose blood Use daily to check blood sugar.   FREESTYLE LITE test strip Generic  drug: glucose blood Use daily to check blood sugar.   glyBURIDE  2.5 MG tablet Commonly known as: DIABETA  Take 1 tablet (2.5 mg total) by mouth daily with breakfast. What changed:  when to take this reasons to take this   Gvoke HypoPen  1-Pack 1 MG/0.2ML Soaj Generic drug: Glucagon  Inject into the skin in the event of low sugar.   Jardiance  25 MG Tabs tablet Generic drug: empagliflozin  Take 1 tablet (25 mg total) by mouth daily before breakfast.   losartan  25 MG tablet Commonly known as: COZAAR  Take 1  tablet (25 mg total) by mouth daily.   meclizine  25 MG tablet Commonly known as: ANTIVERT  Take 1 tablet (25 mg total) by mouth 3 (three) times daily as needed for dizziness.   metFORMIN  500 MG 24 hr tablet Commonly known as: GLUCOPHAGE -XR Take 2 tablets (1,000 mg total) by mouth 2 (two) times daily with a meal.   methocarbamol 500 MG tablet Commonly known as: ROBAXIN Take 1 tablet (500 mg total) by mouth every 6 (six) hours as needed for muscle spasms.   methylphenidate  20 MG tablet Commonly known as: Ritalin  Take 1 tablet (20 mg total) by mouth 2 (two) times daily.   methylphenidate  20 MG tablet Commonly known as: Ritalin  Take 1 tablet (20 mg total) by mouth 2 (two) times daily.   methylphenidate  20 MG tablet Commonly known as: Ritalin  Take 1 tablet (20 mg total) by mouth 2 (two) times daily.   montelukast  10 MG tablet Commonly known as: SINGULAIR  Take 1 tablet (10 mg total) by mouth daily.   multivitamin with minerals Tabs tablet Take 1 tablet by mouth in the morning. One A Day   nitroGLYCERIN  0.4 MG SL tablet Commonly known as: NITROSTAT  Place 0.4 mg under the tongue every 5 (five) minutes x 3 doses as needed for chest pain.   oxyCODONE -acetaminophen  5-325 MG tablet Commonly known as: PERCOCET/ROXICET Take 1-2 tablets by mouth every 6 (six) hours as needed for severe pain (pain score 7-10) or moderate pain (pain score 4-6).   pantoprazole  40 MG tablet Commonly known as: PROTONIX  Take 1 tablet (40 mg total) by mouth daily.   pioglitazone  45 MG tablet Commonly known as: Actos  Take 1 tablet (45 mg total) by mouth daily.   PROBIOTIC PO Take 2 capsules by mouth in the morning.   rosuvastatin  5 MG tablet Commonly known as: Crestor  Take 1 tablet (5 mg total) by mouth daily.   sildenafil  100 MG tablet Commonly known as: VIAGRA  Take 1 tablet (100 mg total) by mouth daily as needed. Take 1 hour prior to sexual activity   testosterone  50 MG/5GM (1%) Gel Commonly  known as: ANDROGEL  Apply 2 applications onto the skin daily.   Vitamin D  (Ergocalciferol ) 1.25 MG (50000 UNIT) Caps capsule Commonly known as: DRISDOL  Take 1 capsule (50,000 Units total) by mouth every 7 (seven) days. What changed: when to take this               Discharge Care Instructions  (From admission, onward)           Start     Ordered   10/26/23 0000  Discharge wound care:       Comments: Okay to shower. Do not apply salves or appointments to incision. No heavy lifting with the upper extremities greater than 10 pounds. May resume driving when not requiring pain medication and patient feels comfortable with doing so.   10/26/23 1355  Signed: Victory PARAS Toan Mort 10/26/2023, 1:56 PM

## 2023-10-26 NOTE — Evaluation (Signed)
 Occupational Therapy Evaluation Patient Details Name: Reginald Rios MRN: [id number] DOB: 11/20/1955 Today's Date: 10/26/2023   History of Present Illness   68 yo M s/p PLIF.  PMH includes: OA, LBP, HLD, heart murmur, asthma, DM, CAD, anemia, ADD, achilles tenotomy, L heart cath & angio 06/2017, coronary stent 06/2017, C4-6 fusion.     Clinical Impressions Patient admitted for the diagnosis above.  PTA he lives at home, and despite back pain, remained independent with ADL, light iADL and mobility.  Pain is the primary deficit, but he is moving fairly well with and without the RW for short distances.  Min A for lower body ADL.  No post acute OT is anticipated.  Recommend follow up as prescribed by MD.       If plan is discharge home, recommend the following:   Assist for transportation;Assistance with cooking/housework;A little help with bathing/dressing/bathroom     Functional Status Assessment   Patient has had a recent decline in their functional status and demonstrates the ability to make significant improvements in function in a reasonable and predictable amount of time.     Equipment Recommendations   None recommended by OT     Recommendations for Other Services         Precautions/Restrictions   Precautions Precautions: Back Precaution Booklet Issued: No Recall of Precautions/Restrictions: Intact Precaution/Restrictions Comments: Min VC's Restrictions Weight Bearing Restrictions Per Provider Order: No     Mobility Bed Mobility Overal bed mobility: Modified Independent                  Transfers Overall transfer level: Needs assistance   Transfers: Sit to/from Stand, Bed to chair/wheelchair/BSC Sit to Stand: Supervision     Step pivot transfers: Modified independent (Device/Increase time), Supervision            Balance Overall balance assessment: Mild deficits observed, not formally tested                                          ADL either performed or assessed with clinical judgement   ADL                       Lower Body Dressing: Minimal assistance;Sit to/from stand   Toilet Transfer: Modified Independent;Supervision/safety;Rolling walker (2 wheels);Regular Toilet;Ambulation                   Vision Patient Visual Report: No change from baseline       Perception Perception: Not tested       Praxis Praxis: Not tested       Pertinent Vitals/Pain Pain Assessment Pain Assessment: Faces Faces Pain Scale: Hurts even more Pain Location: B upper legs Pain Descriptors / Indicators: Aching Pain Intervention(s): Monitored during session     Extremity/Trunk Assessment Upper Extremity Assessment Upper Extremity Assessment: Overall WFL for tasks assessed   Lower Extremity Assessment Lower Extremity Assessment: Defer to PT evaluation   Cervical / Trunk Assessment Cervical / Trunk Assessment: Back Surgery   Communication Communication Communication: No apparent difficulties   Cognition Arousal: Alert Behavior During Therapy: WFL for tasks assessed/performed Cognition: No apparent impairments                               Following commands: Intact       Cueing  General Comments   Cueing Techniques: Verbal cues   VSS on RA   Exercises     Shoulder Instructions      Home Living Family/patient expects to be discharged to:: Private residence Living Arrangements: Spouse/significant other;Children Available Help at Discharge: Family;Available 24 hours/day Type of Home: House Home Access: Stairs to enter [employer] of Steps: 2 Entrance Stairs-Rails: None Home Layout: Two level;1/2 bath on main level;Bed/bath upstairs Alternate Level Stairs-Number of Steps: 20 Alternate Level Stairs-Rails: Right Bathroom Shower/Tub: Tub/shower unit;Walk-in shower   Bathroom Toilet: Standard Bathroom Accessibility: Yes How Accessible:  Accessible via walker Home Equipment: None          Prior Functioning/Environment Prior Level of Function : Independent/Modified Independent;Driving                    OT Problem List: Pain   OT Treatment/Interventions:        OT Goals(Current goals can be found in the care plan section)   Acute Rehab OT Goals Patient Stated Goal: Return home OT Goal Formulation: With patient Time For Goal Achievement: 11/09/23 Potential to Achieve Goals: Good   OT Frequency:       Co-evaluation              AM-PAC OT 6 Clicks Daily Activity     Outcome Measure Help from another person eating meals?: None Help from another person taking care of personal grooming?: None Help from another person toileting, which includes using toliet, bedpan, or urinal?: A Little Help from another person bathing (including washing, rinsing, drying)?: A Little Help from another person to put on and taking off regular upper body clothing?: None Help from another person to put on and taking off regular lower body clothing?: A Little 6 Click Score: 21   End of Session Equipment Utilized During Treatment: Rolling walker (2 wheels) Nurse Communication: Mobility status  Activity Tolerance: Patient tolerated treatment well Patient left: in bed;with call bell/phone within reach  OT Visit Diagnosis: Unsteadiness on feet (R26.81)                Time: 9179-9155 OT Time Calculation (min): 24 min Charges:  OT General Charges $OT Visit: 1 Visit OT Evaluation $OT Eval Moderate Complexity: 1 Mod OT Treatments $Self Care/Home Management : 8-22 mins  10/26/2023  RP, OTR/L  Acute Rehabilitation Services  Office:  [phone]   Charlie JONETTA Halsted 10/26/2023, 8:49 AM

## 2023-10-26 NOTE — Evaluation (Signed)
 Physical Therapy Evaluation  Patient Details Name: Reginald Rios MRN: [id number] DOB: Feb 02, 1956 Today's Date: 10/26/2023  History of Present Illness  68 yo M s/p PLIF.  PMH includes: OA, LBP, HLD, heart murmur, asthma, DM, CAD, anemia, ADD, achilles tenotomy, L heart cath & angio 06/2017, coronary stent 06/2017, C4-6 fusion.  Clinical Impression  Pt admitted with above diagnosis. At the time of PT eval, pt was able to demonstrate transfers and ambulation with gross CGA to supervision for safety and RW for support. Trialed SPC and no AD, and pt's posture suffered as distance progressed. Pt agreeable to RW use for now. Pt was educated on precautions, brace application/wearing schedule, appropriate activity progression, and car transfer. Pt currently with functional limitations due to the deficits listed below (see PT Problem List). Pt will benefit from skilled PT to increase their independence and safety with mobility to allow discharge to the venue listed below.          If plan is discharge home, recommend the following: A little help with walking and/or transfers;A little help with bathing/dressing/bathroom;Assistance with cooking/housework;Assist for transportation;Help with stairs or ramp for entrance   Can travel by private vehicle        Equipment Recommendations Rolling walker (2 wheels)  Recommendations for Other Services       Functional Status Assessment Patient has had a recent decline in their functional status and demonstrates the ability to make significant improvements in function in a reasonable and predictable amount of time.     Precautions / Restrictions Precautions Precautions: Back Precaution Booklet Issued: Yes (comment) Recall of Precautions/Restrictions: Intact Precaution/Restrictions Comments: Reviewed handout and pt was cued for precautions during functional mobility. Restrictions Weight Bearing Restrictions Per Provider Order: No      Mobility  Bed  Mobility               General bed mobility comments: Pt was received sitting up EOB when PT arrived.    Transfers Overall transfer level: Needs assistance Equipment used: Rolling walker (2 wheels) Transfers: Sit to/from Stand, Bed to chair/wheelchair/BSC Sit to Stand: Supervision           General transfer comment: VC's for improved posture and hand placement on seated surface for safety.    Ambulation/Gait Ambulation/Gait assistance: Contact guard assist, Supervision Gait Distance (Feet): 500 Feet Assistive device: Rolling walker (2 wheels) Gait Pattern/deviations: Step-through pattern, Decreased stride length, Trunk flexed, Narrow base of support Gait velocity: Decreased Gait velocity interpretation: <1.31 ft/sec, indicative of household ambulator   General Gait Details: VC's for improved posture, closer walker proximity and forward gaze. No assist required and no overt LOB noted, however pt with overall flexed trunk and difficulty maintaining upright posture.  Stairs Stairs: Yes Stairs assistance: Contact guard assist Stair Management: One rail Right, Step to pattern, Forwards Number of Stairs: 10 General stair comments: VC's for sequencing and general safety.  Wheelchair Mobility     Tilt Bed    Modified Rankin (Stroke Patients Only)       Balance Overall balance assessment: Mild deficits observed, not formally tested                                           Pertinent Vitals/Pain Pain Assessment Pain Assessment: Faces Faces Pain Scale: Hurts even more Pain Location: B upper legs Pain Descriptors / Indicators: Aching Pain Intervention(s): Limited activity within  patient's tolerance, Monitored during session, Repositioned    Home Living Family/patient expects to be discharged to:: Private residence Living Arrangements: Spouse/significant other;Children Available Help at Discharge: Family;Available 24 hours/day Type of Home:  House Home Access: Stairs to enter Entrance Stairs-Rails: None Entrance Stairs-Number of Steps: 2 Alternate Level Stairs-Number of Steps: 20 Home Layout: Two level;1/2 bath on main level;Bed/bath upstairs Home Equipment: None      Prior Function Prior Level of Function : Independent/Modified Independent;Driving                     Extremity/Trunk Assessment   Upper Extremity Assessment Upper Extremity Assessment: Defer to OT evaluation    Lower Extremity Assessment Lower Extremity Assessment: Generalized weakness    Cervical / Trunk Assessment Cervical / Trunk Assessment: Back Surgery  Communication   Communication Communication: No apparent difficulties    Cognition Arousal: Alert Behavior During Therapy: WFL for tasks assessed/performed   PT - Cognitive impairments: No apparent impairments                         Following commands: Intact       Cueing Cueing Techniques: Verbal cues     General Comments      Exercises     Assessment/Plan    PT Assessment Patient needs continued PT services  PT Problem List Decreased strength;Decreased range of motion;Decreased activity tolerance;Decreased balance;Decreased mobility;Decreased knowledge of use of DME;Decreased safety awareness;Decreased knowledge of precautions;Pain       PT Treatment Interventions DME instruction;Gait training;Stair training;Functional mobility training;Therapeutic activities;Therapeutic exercise;Balance training;Patient/family education    PT Goals (Current goals can be found in the Care Plan section)  Acute Rehab PT Goals Patient Stated Goal: Home today PT Goal Formulation: With patient Time For Goal Achievement: 11/02/23 Potential to Achieve Goals: Good    Frequency Min 5X/week     Co-evaluation               AM-PAC PT 6 Clicks Mobility  Outcome Measure Help needed turning from your back to your side while in a flat bed without using bedrails?: A  Little Help needed moving from lying on your back to sitting on the side of a flat bed without using bedrails?: A Little Help needed moving to and from a bed to a chair (including a wheelchair)?: A Little Help needed standing up from a chair using your arms (e.g., wheelchair or bedside chair)?: A Little Help needed to walk in hospital room?: A Little Help needed climbing 3-5 steps with a railing? : A Little 6 Click Score: 18    End of Session Equipment Utilized During Treatment: Gait belt Activity Tolerance: Patient tolerated treatment well Patient left: in bed;with call bell/phone within reach;with family/visitor present Nurse Communication: Mobility status PT Visit Diagnosis: Unsteadiness on feet (R26.81);Pain Pain - part of body:  (back)    Time: 9097-9062 PT Time Calculation (min) (ACUTE ONLY): 35 min   Charges:   PT Evaluation $PT Eval Low Complexity: 1 Low PT Treatments $Gait Training: 8-22 mins PT General Charges $$ ACUTE PT VISIT: 1 Visit         Leita Sable, PT, DPT Acute Rehabilitation Services Secure Chat Preferred Office: [phone]   Leita JONETTA Sable 10/26/2023, 10:44 AM

## 2023-10-26 NOTE — Discharge Instructions (Signed)
 Wound Care Remove dressing in 2 days  Leave incision open to air. You may shower. Do not scrub directly on incision.  Do not put any creams, lotions, or ointments on incision. Activity Walk each and every day, increasing distance each day. No lifting greater than 8 lbs.  Avoid bending, arching, and twisting. No driving for 2 weeks; may ride as a passenger locally. If provided with back brace, wear when out of bed.  It is not necessary to wear in bed. Diet Resume your normal diet.  Return to Work Will be discussed at you follow up appointment. Call Your Doctor If Any of These Occur Redness, drainage, or swelling at the wound.  Temperature greater than 101 degrees. Severe pain not relieved by pain medication. Incision starts to come apart. Follow Up Appt Call today for appointment in 3 weeks [phone]) or for problems.  If you have any hardware placed in your spine, you will need an x-ray before your appointment.

## 2023-10-27 MED FILL — Thrombin For Soln 5000 Unit: CUTANEOUS | Qty: 5000 | Status: AC

## 2023-10-27 NOTE — Anesthesia Postprocedure Evaluation (Signed)
 Anesthesia Post Note  Patient: Parsa Rickett Pretlow  Procedure(s) Performed: Posterior Lumbar Interbody Fusion, Posterior Lateral and Interbody fusion - Lumbar Four-Lumbar Five - Lumbar Five-Sacral One (Back)     Patient location during evaluation: PACU Anesthesia Type: General Level of consciousness: awake Pain management: pain level controlled Vital Signs Assessment: post-procedure vital signs reviewed and stable Respiratory status: spontaneous breathing, nonlabored ventilation and respiratory function stable Cardiovascular status: blood pressure returned to baseline and stable Postop Assessment: no apparent nausea or vomiting Anesthetic complications: no   No notable events documented.  Last Vitals:  Vitals:   10/26/23 0426 10/26/23 0745  BP: (!) 104/54 (!) 122/56  Pulse: 88 (!) 103  Resp: 20 18  Temp: 37.6 C 37.3 C  SpO2: 98% 100%    Last Pain:  Vitals:   10/26/23 1526  TempSrc:   PainSc: 4                  Naviyah Schaffert P Tamieka Rancourt

## 2023-10-28 MED FILL — Sodium Chloride IV Soln 0.9%: INTRAVENOUS | Qty: 2000 | Status: AC

## 2023-10-28 MED FILL — Heparin Sodium (Porcine) Inj 1000 Unit/ML: INTRAMUSCULAR | Qty: 30 | Status: AC

## 2023-11-02 ENCOUNTER — Other Ambulatory Visit (HOSPITAL_BASED_OUTPATIENT_CLINIC_OR_DEPARTMENT_OTHER): Payer: Self-pay

## 2023-11-02 ENCOUNTER — Other Ambulatory Visit (HOSPITAL_COMMUNITY): Payer: Self-pay

## 2023-11-02 MED ORDER — METHOCARBAMOL 500 MG PO TABS
500.0000 mg | ORAL_TABLET | Freq: Four times a day (QID) | ORAL | 0 refills | Status: DC | PRN
  Filled 2023-11-02: qty 40, 10d supply, fill #0

## 2023-11-03 ENCOUNTER — Other Ambulatory Visit (HOSPITAL_COMMUNITY): Payer: Self-pay

## 2023-11-03 ENCOUNTER — Encounter: Payer: Self-pay | Admitting: Family Medicine

## 2023-11-10 ENCOUNTER — Other Ambulatory Visit: Payer: Self-pay | Admitting: Cardiology

## 2023-11-10 ENCOUNTER — Other Ambulatory Visit: Payer: Self-pay

## 2023-11-11 ENCOUNTER — Ambulatory Visit

## 2023-11-11 DIAGNOSIS — E538 Deficiency of other specified B group vitamins: Secondary | ICD-10-CM | POA: Diagnosis not present

## 2023-11-11 MED ORDER — CYANOCOBALAMIN 1000 MCG/ML IJ SOLN
1000.0000 ug | Freq: Once | INTRAMUSCULAR | Status: AC
  Administered 2023-11-11: 1000 ug via INTRAMUSCULAR

## 2023-11-11 NOTE — Progress Notes (Signed)
 Pt here for monthly B12 injection per Frann Mabel Mt, DO  B12 1000mcg given left deltoid IM and pt tolerated injection well.  Next B12 injection scheduled for 12/10/2023

## 2023-11-12 ENCOUNTER — Other Ambulatory Visit: Payer: Self-pay

## 2023-11-12 ENCOUNTER — Other Ambulatory Visit (HOSPITAL_COMMUNITY): Payer: Self-pay

## 2023-11-12 DIAGNOSIS — M4317 Spondylolisthesis, lumbosacral region: Secondary | ICD-10-CM | POA: Diagnosis not present

## 2023-11-12 DIAGNOSIS — Z6826 Body mass index (BMI) 26.0-26.9, adult: Secondary | ICD-10-CM | POA: Diagnosis not present

## 2023-11-12 MED ORDER — LOSARTAN POTASSIUM 25 MG PO TABS
25.0000 mg | ORAL_TABLET | Freq: Every day | ORAL | 2 refills | Status: AC
  Filled 2023-11-12 – 2024-02-08 (×2): qty 90, 90d supply, fill #0
  Filled 2024-05-23: qty 90, 90d supply, fill #1

## 2023-11-15 ENCOUNTER — Ambulatory Visit

## 2023-11-17 ENCOUNTER — Other Ambulatory Visit (HOSPITAL_BASED_OUTPATIENT_CLINIC_OR_DEPARTMENT_OTHER): Payer: Self-pay | Admitting: Neurological Surgery

## 2023-11-17 DIAGNOSIS — M4317 Spondylolisthesis, lumbosacral region: Secondary | ICD-10-CM

## 2023-11-24 ENCOUNTER — Ambulatory Visit (HOSPITAL_BASED_OUTPATIENT_CLINIC_OR_DEPARTMENT_OTHER)
Admission: RE | Admit: 2023-11-24 | Discharge: 2023-11-24 | Disposition: A | Discharge status: Home / Self Care | Source: Ambulatory Visit | Attending: Neurological Surgery | Admitting: Neurological Surgery

## 2023-11-24 DIAGNOSIS — M47816 Spondylosis without myelopathy or radiculopathy, lumbar region: Secondary | ICD-10-CM | POA: Diagnosis not present

## 2023-11-24 DIAGNOSIS — M48061 Spinal stenosis, lumbar region without neurogenic claudication: Secondary | ICD-10-CM | POA: Diagnosis not present

## 2023-11-24 DIAGNOSIS — M5136 Other intervertebral disc degeneration, lumbar region with discogenic back pain only: Secondary | ICD-10-CM | POA: Diagnosis not present

## 2023-11-24 DIAGNOSIS — M5126 Other intervertebral disc displacement, lumbar region: Secondary | ICD-10-CM | POA: Diagnosis not present

## 2023-11-24 DIAGNOSIS — M4317 Spondylolisthesis, lumbosacral region: Secondary | ICD-10-CM | POA: Insufficient documentation

## 2023-11-25 DIAGNOSIS — H43811 Vitreous degeneration, right eye: Secondary | ICD-10-CM | POA: Diagnosis not present

## 2023-11-25 DIAGNOSIS — H31091 Other chorioretinal scars, right eye: Secondary | ICD-10-CM | POA: Diagnosis not present

## 2023-11-25 DIAGNOSIS — E119 Type 2 diabetes mellitus without complications: Secondary | ICD-10-CM | POA: Diagnosis not present

## 2023-11-25 DIAGNOSIS — Z961 Presence of intraocular lens: Secondary | ICD-10-CM | POA: Diagnosis not present

## 2023-11-25 LAB — HM DIABETES EYE EXAM

## 2023-11-28 ENCOUNTER — Other Ambulatory Visit: Payer: Self-pay | Admitting: Cardiology

## 2023-11-29 ENCOUNTER — Other Ambulatory Visit (HOSPITAL_BASED_OUTPATIENT_CLINIC_OR_DEPARTMENT_OTHER): Payer: Self-pay

## 2023-11-29 ENCOUNTER — Other Ambulatory Visit: Payer: Self-pay

## 2023-11-30 ENCOUNTER — Other Ambulatory Visit: Payer: Self-pay

## 2023-11-30 ENCOUNTER — Other Ambulatory Visit (HOSPITAL_BASED_OUTPATIENT_CLINIC_OR_DEPARTMENT_OTHER): Payer: Self-pay

## 2023-11-30 MED ORDER — EZETIMIBE 10 MG PO TABS
10.0000 mg | ORAL_TABLET | Freq: Every day | ORAL | 3 refills | Status: AC
  Filled 2023-11-30: qty 90, 90d supply, fill #0
  Filled 2024-02-25: qty 90, 90d supply, fill #1
  Filled 2024-05-29: qty 90, 90d supply, fill #2

## 2023-12-06 ENCOUNTER — Other Ambulatory Visit: Payer: Self-pay | Admitting: Family Medicine

## 2023-12-07 ENCOUNTER — Other Ambulatory Visit (HOSPITAL_BASED_OUTPATIENT_CLINIC_OR_DEPARTMENT_OTHER): Payer: Self-pay

## 2023-12-07 MED ORDER — METHYLPHENIDATE HCL 20 MG PO TABS: 20.0000 mg | ORAL_TABLET | Freq: Two times a day (BID) | ORAL | 0 refills | Status: DC

## 2023-12-07 MED ORDER — METHYLPHENIDATE HCL 20 MG PO TABS
20.0000 mg | ORAL_TABLET | Freq: Two times a day (BID) | ORAL | 0 refills | Status: DC
  Filled 2023-12-07: qty 60, 30d supply, fill #0

## 2023-12-07 NOTE — Telephone Encounter (Signed)
 Plz make note to update CSC and UDS at his nxt visit. Thx.

## 2023-12-10 ENCOUNTER — Ambulatory Visit: Admitting: Neurology

## 2023-12-10 DIAGNOSIS — E538 Deficiency of other specified B group vitamins: Secondary | ICD-10-CM | POA: Diagnosis not present

## 2023-12-10 MED ORDER — CYANOCOBALAMIN 1000 MCG/ML IJ SOLN
1000.0000 ug | Freq: Once | INTRAMUSCULAR | Status: AC
Start: 2023-12-10 — End: 2023-12-10
  Administered 2023-12-10: 1000 ug via INTRAMUSCULAR

## 2023-12-10 NOTE — Progress Notes (Signed)
 Patient is here for a vitamin B12 injection per orders from Dr. Frann:  08/09/2023: plz set pt up for monthly B12 injections. Ty.   Last injection: 11/11/2023. Denies gastrointestinal problems or dizziness.  B12 injection to left deltoid with no apparent complications.  Scheduled next injection in one month: 01/13/2024.

## 2023-12-20 ENCOUNTER — Encounter: Attending: "Endocrinology | Admitting: Dietician

## 2023-12-20 ENCOUNTER — Encounter: Payer: Self-pay | Admitting: Dietician

## 2023-12-20 VITALS — Wt 175.0 lb

## 2023-12-20 DIAGNOSIS — E1165 Type 2 diabetes mellitus with hyperglycemia: Secondary | ICD-10-CM | POA: Diagnosis not present

## 2023-12-20 NOTE — Progress Notes (Signed)
 Diabetes Self-Management Education  Visit Type: Follow-up  Appt. Start Time: 1405 Appt. End Time: 1435  12/20/2023  [name], identified by name and date of birth, is a 68 y.o. male with a diagnosis of Diabetes:  .   ASSESSMENT Patient is here today alone.  He was last seen by this RD on 10/20/2023.  He states that he has made many changes.  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 Appetite cut in half since surgery, poor appetite also related to ritalin . Has to wait for exercise until surgeon approves and will start with PT. Fasting blood glucose 137-171 and before lunch of 129-171 and before dinner of 150-170 Discussed his pernicious and iron deficient anemia.  Aiming to keep his carb intake <50 grams per meal Hello Fresh dinner (protein, vegetable, potato or rice), Amy's soup    Referral:  Type 2 Diabetes   History includes:  Type 2 Diabetes (1993), back/neck issues, hoarse, HLD, neuropathy, GERD, ADD, pernicious anemia Medications includes:  Jardiance , glyburide  (prn when glucose is >200), Metformin -XR, Pioglitizone, calcium  with D3, Vitamin B-12 injection, Vitamin D  50,000 units once per week, MVI, ritalin  Labs noted to include:  A1C 7.2% 10/11/2023 decreased from 9.3% 07/08/2023 increased 8% 01/26/2023, Vitamin B-12 was 110 on 07/05/23 and increased to 1537 on 08/09/2023 after a B-12 injection, Vitamin D  28 on 07/05/2023, positive intrinsic factor 08/09/2023. Lipid Panel     Component Value Date/Time   CHOL 114 04/13/2023 0900   CHOL 156 04/22/2022 1439   TRIG 67.0 04/13/2023 0900   HDL 45.00 04/13/2023 0900   HDL 53 04/22/2022 1439   CHOLHDL 3 04/13/2023 0900   VLDL 13.4 04/13/2023 0900   LDLCALC 55 04/13/2023 0900   LDLCALC 89 04/22/2022 1439   LABVLDL 14 04/22/2022 1439   Estimated protein needs 90-100 grams per day.   68 175 lbs 12/20/2023 197 lbs 10/18/2023  200 lbs 08/16/2024 210 lbs 02/2023   Patient lives with his  wife and 2 adult children.  His wife is a Therapist, sports for [employer].  She does the shopping and they get Hello Fresh and patient cooks these meals.  They are choose more plant based and less carbs.  Avoids added salt.  Avoids eating out. Was playing pickle ball 3 times per week, walking dog 2 times per day, and yoga twice per week prior to increased back surgery and has since decreased this to 1-2 times per week. 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 Increased pain and some depression from chronic pain.  Weight 175 lb (79.4 kg). Body mass index is 25.11 kg/m.   Diabetes Self-Management Education - 12/20/23 1616       Visit Information   Visit Type Follow-up      Psychosocial Assessment   Patient Belief/Attitude about Diabetes Motivated to manage diabetes    What is the hardest part about your diabetes right now, causing you the most concern, or is the most worrisome to you about your diabetes?   Getting support / problem solving    Self-care barriers None    Self-management support Doctor's office;Family;CDE visits    Other persons present Patient    Patient Concerns Nutrition/Meal planning;Glycemic Control;Healthy Lifestyle    Special Needs None    Preferred Learning Style No preference indicated    Learning Readiness Ready    How often do you need to have someone help you when you read instructions, pamphlets, or  other written materials from your doctor or pharmacy? 1 - Never      Pre-Education Assessment   Patient understands the diabetes disease and treatment process. Demonstrates understanding / competency    Patient understands incorporating nutritional management into lifestyle. Comprehends key points    Patient undertands incorporating physical activity into lifestyle. Demonstrates understanding / competency    Patient understands using medications safely. Demonstrates understanding / competency    Patient understands monitoring blood glucose,  interpreting and using results Demonstrates understanding / competency    Patient understands prevention, detection, and treatment of acute complications. Demonstrates understanding / competency    Patient understands prevention, detection, and treatment of chronic complications. Demonstrates understanding / competency    Patient understands how to develop strategies to address psychosocial issues. Demonstrates understanding / competency    Patient understands how to develop strategies to promote health/change behavior. Comprehends key points      Complications   Last HgB A1C per patient/outside source 7.2 %   10/11/2023   How often do you check your blood sugar? > 4 times/day    Fasting Blood glucose range (mg/dL) 869-820    Postprandial Blood glucose range (mg/dL) 869-820;29-870      Dietary Intake   Breakfast omelet, hashbrowns, fruit    Snack (afternoon) occasional granola bar    Dinner grilled chicken, vegetables    Beverage(s) water, mio, diet coke, hot tea with splenda, zero gatorade      Activity / Exercise   Activity / Exercise Type ADL's      Patient Education   Previous Diabetes Education Yes   10/2023   Disease Pathophysiology Other (comment);Explored patient's options for treatment of their diabetes    Healthy Eating Meal options for control of blood glucose level and chronic complications.    Being Active Role of exercise on diabetes management, blood pressure control and cardiac health.    Medications Reviewed patients medication for diabetes, action, purpose, timing of dose and side effects.    Monitoring Taught/evaluated CGM (comment)    Diabetes Stress and Support Identified and addressed patients feelings and concerns about diabetes;Worked with patient to identify barriers to care and solutions;Role of stress on diabetes      Individualized Goals (developed by patient)   Nutrition General guidelines for healthy choices and portions discussed    Physical Activity Not  Applicable    Medications take my medication as prescribed    Monitoring  Test my blood glucose as discussed    Problem Solving Eating Pattern;Addressing barriers to behavior change    Reducing Risk examine blood glucose patterns;do foot checks daily;treat hypoglycemia with 15 grams of carbs if blood glucose less than 70mg /dL   increase protein     Patient Self-Evaluation of Goals - Patient rates self as meeting previously set goals (% of time)   Nutrition >75% (most of the time)    Physical Activity Not Applicable    Medications >75% (most of the time)    Monitoring >75% (most of the time)    Problem Solving and behavior change strategies  >75% (most of the time)    Reducing Risk (treating acute and chronic complications) >75% (most of the time)    Health Coping >75% (most of the time)      Post-Education Assessment   Patient understands the diabetes disease and treatment process. Demonstrates understanding / competency    Patient understands incorporating nutritional management into lifestyle. Comprehends key points    Patient undertands incorporating physical activity into lifestyle.  Demonstrates understanding / competency    Patient understands using medications safely. Demonstrates understanding / competency    Patient understands monitoring blood glucose, interpreting and using results Demonstrates understanding / competency    Patient understands prevention, detection, and treatment of acute complications. Demonstrates understanding / competency    Patient understands prevention, detection, and treatment of chronic complications. Demonstrates understanding / competency    Patient understands how to develop strategies to address psychosocial issues. Demonstrates understanding / competency    Patient understands how to develop strategies to promote health/change behavior. Comprehends key points      Outcomes   Expected Outcomes Demonstrated interest in learning. Expect positive  outcomes    Future DMSE 3-4 months    Program Status Not Completed      Subsequent Visit   Since your last visit have you continued or begun to take your medications as prescribed? Yes    Since your last visit have you experienced any weight changes? Loss    Weight Loss (lbs) 21    Since your last visit, are you checking your blood glucose at least once a day? Yes          Individualized Plan for Diabetes Self-Management Training:   Learning Objective:  Patient will have a greater understanding of diabetes self-management. Patient education plan is to attend individual and/or group sessions per assessed needs and concerns.   Plan:   Patient Instructions  Drink mostly water Resume exercise when given permission by your MD   Add a protein snack or lunch in the middle of the day  [place] yogurt  Protein bar [facility] Protein bar)  Protein shake  Peanut butter with 6 crackers  Cheese and crackers  Chicken salad on celery, [employer] bread, crackers          Expected Outcomes:  Demonstrated interest in learning. Expect positive outcomes  Education material provided: Iron deficiency anemia nutrition therapy  If problems or questions, patient to contact team via:  Phone  Future DSME appointment: 3-4 months

## 2023-12-20 NOTE — Patient Instructions (Signed)
 Drink mostly water Resume exercise when given permission by your MD   Add a protein snack or lunch in the middle of the day  [place] yogurt  Protein bar [facility] Protein bar)  Protein shake  Peanut butter with 6 crackers  Cheese and crackers  Chicken salad on celery, [employer] bread, crackers

## 2023-12-22 ENCOUNTER — Encounter: Payer: Self-pay | Admitting: Family Medicine

## 2023-12-22 ENCOUNTER — Other Ambulatory Visit (HOSPITAL_BASED_OUTPATIENT_CLINIC_OR_DEPARTMENT_OTHER): Payer: Self-pay

## 2023-12-22 ENCOUNTER — Ambulatory Visit: Admitting: Family Medicine

## 2023-12-22 VITALS — BP 120/62 | HR 100 | Temp 98.0°F | Resp 16 | Ht 70.0 in | Wt 173.8 lb

## 2023-12-22 DIAGNOSIS — L308 Other specified dermatitis: Secondary | ICD-10-CM

## 2023-12-22 MED ORDER — CLOBETASOL PROPIONATE 0.05 % EX SOLN
1.0000 | Freq: Two times a day (BID) | CUTANEOUS | 0 refills | Status: DC
  Filled 2023-12-22: qty 50, 25d supply, fill #0

## 2023-12-22 MED ORDER — LEVOCETIRIZINE DIHYDROCHLORIDE 5 MG PO TABS
5.0000 mg | ORAL_TABLET | Freq: Every evening | ORAL | 2 refills | Status: DC
  Filled 2023-12-22: qty 90, 90d supply, fill #0

## 2023-12-22 NOTE — Patient Instructions (Addendum)
 Try not to scratch as this can make things worse. Avoid scented products while dealing with this. You may resume when the itchiness resolves. Cold/cool compresses can help.   Claritin (loratadine), Allegra (fexofenadine), Zyrtec (cetirizine) which is also equivalent to Xyzal  (levocetirizine); these are listed in order from weakest to strongest. Generic, and therefore cheaper, options are in the parentheses.   Flonase  (fluticasone ); nasal spray that is over the counter. 2 sprays each nostril, once daily. Aim towards the same side eye when you spray.  There are available OTC, and the generic versions, which may be cheaper, are in parentheses. Show this to a pharmacist if you have trouble finding any of these items.  When you do wash it, use only soap and water. Do not vigorously scrub. Apply triple antibiotic ointment (like Neosporin) twice daily. Keep the area clean and dry.   Things to look out for: increasing pain not relieved by acetaminophen , fevers, spreading redness, drainage of pus, or foul odor.  Let us  know if you need anything.

## 2023-12-22 NOTE — Progress Notes (Signed)
 Chief Complaint  Patient presents with   Rash    Rash     Reginald Rios is a 68 y.o. male here for a skin complaint.  Duration: 2 months Location: scalp Pruritic? Yes Painful? Yes- over back of scalp starting around 2 weeks ago Drainage? Yes- clear drainage at times New soaps/lotions/topicals/detergents? No Sick contacts? No Other associated symptoms: no fevers, SOB, swelling Therapies tried thus far: anti-itch cream  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   BPPV (benign paroxysmal positional vertigo) 08/14/2014   CAD (coronary artery disease)    2/19 PCI/DESx1 to Lcx   Cervical disc disorder with radiculopathy of cervical region 10/07/2015   Cervical radicular pain 01/10/2019   Chronic cough 04/13/2019   Decreased cardiac ejection fraction 04/02/2015   Diabetes mellitus without complication (HCC)    Dyspnea on exertion 05/15/2015   Erectile dysfunction 07/11/2019   Exercise-induced asthma    Family history of early CAD 07/05/2017   Fatigue 05/15/2015   Hallux rigidus, right foot 09/15/2021   Heart murmur    In early childhood. No issues now   Hemarthrosis of right elbow 02/03/2016   Hoarseness 04/13/2019   Hyperlipidemia    family hx of high cholesterol   Hypertension    Hypogonadism in male    Incomplete rotator cuff tear 09/10/2015   Injected 09/10/2015   Lateral epicondylitis of right elbow 07/28/2019   Low back pain at multiple sites 02/05/2021   Low vitamin D  level 04/22/2017   Mild persistent asthma/cough variant asthma 04/13/2019   Myofascial pain syndrome,  cervical 07/28/2019   Neck pain 06/01/2018   Nonischemic cardiomyopathy (HCC) 05/15/2015   Nontraumatic incomplete tear of right rotator cuff 07/25/2019   Occipital headache 06/01/2018   Onychomycosis 05/25/2018   Osteoarthritis    Pain in right ankle and joints of right foot 10/05/2016   Palpitations 07/06/2017   Pernicious anemia    PONV (postoperative nausea and vomiting)    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   Shoulder bursitis 10/12/2014   Injected in 10/12/2014  Repeat 01/21/2015   Small thenar eminence 02/03/2016   Spinal stenosis, lumbar region, without neurogenic claudication 10/12/2014   Spondylolisthesis at L5-S1 level 05/09/2021   Strain of latissimus dorsi muscle 01/21/2015   Tendinopathy of right biceps tendon 07/25/2019   Type 2 diabetes mellitus with hyperglycemia, without long-term current use of insulin  (HCC) 12/22/2013   Ulnar neuropathy at elbow of right upper extremity 01/03/2019    BP 120/62 (BP Location: Left Arm, Patient Position: Sitting)   Pulse 100   Temp 98 F (36.7 C) (Oral)   Resp 16   Ht 5' 10 (1.778 m)   Wt 173 lb 12.8 oz (78.8 kg)   SpO2 99%   BMI 24.94 kg/m  Gen: awake, alert, appearing stated age Lungs: No accessory muscle use Skin: excoriated lesions on scalp and on RLE. No drainage, erythema, TTP, fluctuance Psych: Age appropriate judgment and insight  Pruritic dermatitis - Plan: levocetirizine (XYZAL ) 5 MG tablet, clobetasol  (TEMOVATE ) 0.05 % external solution  Start antihistamine. Avoid scented products, don't itch. Add solution for relief. TAO bid to prevent infection. F/u  prn. The patient voiced understanding and agreement to the plan.  Mabel [place], DO 12/22/23 1:01 PM

## 2023-12-29 ENCOUNTER — Ambulatory Visit

## 2024-01-03 ENCOUNTER — Other Ambulatory Visit: Payer: Self-pay | Admitting: Family Medicine

## 2024-01-03 ENCOUNTER — Other Ambulatory Visit (HOSPITAL_BASED_OUTPATIENT_CLINIC_OR_DEPARTMENT_OTHER): Payer: Self-pay

## 2024-01-03 DIAGNOSIS — K219 Gastro-esophageal reflux disease without esophagitis: Secondary | ICD-10-CM

## 2024-01-04 ENCOUNTER — Other Ambulatory Visit (HOSPITAL_BASED_OUTPATIENT_CLINIC_OR_DEPARTMENT_OTHER): Payer: Self-pay

## 2024-01-04 ENCOUNTER — Encounter: Payer: Self-pay | Admitting: Family Medicine

## 2024-01-04 ENCOUNTER — Other Ambulatory Visit: Payer: Self-pay

## 2024-01-04 MED ORDER — PANTOPRAZOLE SODIUM 40 MG PO TBEC
40.0000 mg | DELAYED_RELEASE_TABLET | Freq: Every day | ORAL | 0 refills | Status: DC
  Filled 2024-01-04: qty 90, 90d supply, fill #0

## 2024-01-04 MED ORDER — VITAMIN D (ERGOCALCIFEROL) 1.25 MG (50000 UNIT) PO CAPS
50000.0000 [IU] | ORAL_CAPSULE | ORAL | 1 refills | Status: AC
  Filled 2024-01-04: qty 12, 84d supply, fill #0
  Filled 2024-01-21 – 2024-03-28 (×2): qty 12, 84d supply, fill #1

## 2024-01-05 ENCOUNTER — Other Ambulatory Visit: Payer: Self-pay

## 2024-01-05 ENCOUNTER — Other Ambulatory Visit (HOSPITAL_BASED_OUTPATIENT_CLINIC_OR_DEPARTMENT_OTHER): Payer: Self-pay

## 2024-01-05 ENCOUNTER — Other Ambulatory Visit: Payer: Self-pay | Admitting: Family Medicine

## 2024-01-05 DIAGNOSIS — L299 Pruritus, unspecified: Secondary | ICD-10-CM

## 2024-01-05 MED ORDER — KETOCONAZOLE 2 % EX SHAM
1.0000 | MEDICATED_SHAMPOO | CUTANEOUS | 0 refills | Status: AC
  Filled 2024-01-05: qty 120, 30d supply, fill #0

## 2024-01-05 NOTE — Telephone Encounter (Signed)
 Referral placed.

## 2024-01-10 ENCOUNTER — Other Ambulatory Visit (HOSPITAL_BASED_OUTPATIENT_CLINIC_OR_DEPARTMENT_OTHER): Payer: Self-pay

## 2024-01-11 ENCOUNTER — Encounter: Payer: Self-pay | Admitting: "Endocrinology

## 2024-01-11 ENCOUNTER — Other Ambulatory Visit (HOSPITAL_COMMUNITY): Payer: Self-pay

## 2024-01-11 ENCOUNTER — Other Ambulatory Visit: Payer: Self-pay

## 2024-01-11 ENCOUNTER — Ambulatory Visit (INDEPENDENT_AMBULATORY_CARE_PROVIDER_SITE_OTHER): Admitting: "Endocrinology

## 2024-01-11 VITALS — BP 110/80 | HR 84 | Ht 70.0 in | Wt 175.0 lb

## 2024-01-11 DIAGNOSIS — E1165 Type 2 diabetes mellitus with hyperglycemia: Secondary | ICD-10-CM | POA: Diagnosis not present

## 2024-01-11 DIAGNOSIS — Z7984 Long term (current) use of oral hypoglycemic drugs: Secondary | ICD-10-CM

## 2024-01-11 DIAGNOSIS — E7800 Pure hypercholesterolemia, unspecified: Secondary | ICD-10-CM

## 2024-01-11 LAB — POCT GLYCOSYLATED HEMOGLOBIN (HGB A1C): Hemoglobin A1C: 7.1 % — AB (ref 4.0–5.6)

## 2024-01-11 MED ORDER — PIOGLITAZONE HCL 45 MG PO TABS
45.0000 mg | ORAL_TABLET | Freq: Every day | ORAL | 1 refills | Status: AC
  Filled 2024-01-11 – 2024-01-13 (×2): qty 90, 90d supply, fill #0
  Filled 2024-04-28: qty 90, 90d supply, fill #1

## 2024-01-11 MED ORDER — PIOGLITAZONE HCL 45 MG PO TABS
45.0000 mg | ORAL_TABLET | Freq: Every day | ORAL | 1 refills | Status: DC
  Filled 2024-01-11: qty 90, 90d supply, fill #0

## 2024-01-11 NOTE — Progress Notes (Signed)
 Outpatient Endocrinology Note Reginald Birmingham, MD  01/11/24   Alm HERO Woodrum [birth year] [id number]  Referring Provider: Frann Mabel Mt* Primary Care Provider: Frann Mabel Mt, DO Reason for consultation: Subjective   Assessment & Plan  Diagnoses and all orders for this visit:  Uncontrolled type 2 diabetes mellitus with hyperglycemia (HCC) -     POCT glycosylated hemoglobin (Hb A1C) -     Lipid panel -     Microalbumin / creatinine urine ratio -     Comprehensive metabolic panel with GFR -     Hemoglobin A1c  Long term (current) use of oral hypoglycemic drugs  Pure hypercholesterolemia  Other orders -     pioglitazone  (ACTOS ) 45 MG tablet; Take 1 tablet (45 mg total) by mouth daily.    Diabetes Type II complicated by hyperglycemia,  Lab Results  Component Value Date   GFR 91.25 08/09/2023   Hba1c goal less than 7, current Hba1c is  Lab Results  Component Value Date   HGBA1C 7.1 (A) 01/11/2024   Will recommend the following: Jardiance  25 mg every day  Metformin  XR 500 mg 2 pills bid (abdominal cramps on regular metformin ) Actos  45 mg every day  Glyburide  2.5 mg every day PRN (BG >220)  No urine infection No history of heart attack/heart failure/pedal edema/urinary bladder cancer  No known contraindications/side effects to any of above medications  -Last LD and Tg are as follows: Lab Results  Component Value Date   LDLCALC 55 04/13/2023    Lab Results  Component Value Date   TRIG 67.0 04/13/2023   -On rosuvastatin  5 mg every day, ezetimibe  10 mg qd (has cramps with statin before)  -has a cardiologist and cardiac stent  -Follow low fat diet and exercise   -Blood pressure goal <140/90 - Microalbumin/creatinine goal is < 30 -Last MA/Cr is as follows: Lab Results  Component Value Date   MICROALBUR 2.3 (H) 08/09/2023   -on ACE/ARB losartan  25 mg qd -diet changes including salt restriction -limit eating outside -counseled BP  targets per standards of diabetes care -uncontrolled blood pressure can lead to retinopathy, nephropathy and cardiovascular and atherosclerotic heart disease  Reviewed and counseled on: -A1C target -Blood sugar targets -Complications of uncontrolled diabetes  -Checking blood sugar before meals and bedtime and bring log next visit -All medications with mechanism of action and side effects -Hypoglycemia management: rule of 15's, Glucagon  Emergency Kit and medical alert ID -low-carb low-fat plate-method diet -At least 20 minutes of physical activity per day -Annual dilated retinal eye exam and foot exam -compliance and follow up needs -follow up as scheduled or earlier if problem gets worse  Call if blood sugar is less than 70 or consistently above 250    Take a 15 gm snack of carbohydrate at bedtime before you go to sleep if your blood sugar is less than 100.    If you are going to fast after midnight for a test or procedure, ask your physician for instructions on how to reduce/decrease your insulin  dose.    Call if blood sugar is less than 70 or consistently above 250  -Treating a low sugar by rule of 15  (15 gms of sugar every 15 min until sugar is more than 70) If you feel your sugar is low, test your sugar to be sure If your sugar is low (less than 70), then take 15 grams of a fast acting Carbohydrate (3-4 glucose tablets or glucose gel or 4 ounces of juice  or regular soda) Recheck your sugar 15 min after treating low to make sure it is more than 70 If sugar is still less than 70, treat again with 15 grams of carbohydrate          Don't drive the hour of hypoglycemia  If unconscious/unable to eat or drink by mouth, use glucagon  injection or nasal spray baqsimi  and call 911. Can repeat again in 15 min if still unconscious.  Return in about 3 months (around 04/11/2024) for visit and 8 am labs before next visit.   I have reviewed current medications, nurse's notes, allergies, vital  signs, past medical and surgical history, family medical history, and social history for this encounter. Counseled patient on symptoms, examination findings, lab findings, imaging results, treatment decisions and monitoring and prognosis. The patient understood the recommendations and agrees with the treatment plan. All questions regarding treatment plan were fully answered.  Reginald Birmingham, MD  01/11/24    History of Present Illness [name] is a 68 y.o. year old male who presents for follow up of Type II diabetes mellitus.  Reginald Rios was first diagnosed around 2015.  Diabetes education +  Will recommend the following: Jardiance  25 mg every day  Metformin  XR 500 mg 2 pills bid (abdominal cramps on regular metformin ) Actos  45 mg every day  Glyburide  2.5 mg every day PRN (BG >220)  COMPLICATIONS -  MI/Stroke -  retinopathy -  neuropathy -  nephropathy  BLOOD SUGAR DATA 127-210, checks 0-3 times a day   Physical Exam  BP 110/80   Pulse 84   Ht 5' 10 (1.778 m)   Wt 175 lb (79.4 kg)   SpO2 96%   BMI 25.11 kg/m    Constitutional: well developed, well nourished Head: normocephalic, atraumatic Eyes: sclera anicteric, no redness Neck: supple Lungs: normal respiratory effort Neurology: alert and oriented Skin: dry, no appreciable rashes Musculoskeletal: no appreciable defects Psychiatric: normal mood and affect Diabetic Foot Exam - Simple   No data filed      Current Medications Patient's Medications  New Prescriptions   No medications on file  Previous Medications   ACETAMINOPHEN  (TYLENOL ) 500 MG TABLET    Take 1,000 mg by mouth every 6 (six) hours as needed for moderate pain (pain score 4-6).   ALBUTEROL  (VENTOLIN  HFA) 108 (90 BASE) MCG/ACT INHALER    Inhale 2 puffs into the lungs every 4 (four) to 6 (six) hours as needed for coughing or wheezing spells.   AMLODIPINE  (NORVASC ) 2.5 MG TABLET    Take 1 tablet (2.5 mg total) by mouth daily.    AZELASTINE  (ASTELIN ) 0.1 % NASAL SPRAY    Place 2 sprays into both nostrils 2 (two) times daily as needed for rhinitis (nasal congestion).   BLOOD GLUCOSE MONITORING SUPPL (FREESTYLE FREEDOM LITE) W/DEVICE KIT    Use to check blood sugar daily.   CLOPIDOGREL  (PLAVIX ) 75 MG TABLET    Take 1 tablet (75 mg total) by mouth daily.   DICLOFENAC  SODIUM (VOLTAREN ) 1 % GEL    Apply 2 g topically 4 (four) times daily.   EMPAGLIFLOZIN  (JARDIANCE ) 25 MG TABS TABLET    Take 1 tablet (25 mg total) by mouth daily before breakfast.   EZETIMIBE  (ZETIA ) 10 MG TABLET    Take 1 tablet (10 mg total) by mouth daily.   FLUTICASONE  (FLONASE ) 50 MCG/ACT NASAL SPRAY    Place 2 sprays into both nostrils daily.   GLUCAGON  (GVOKE HYPOPEN  1-PACK) 1 MG/0.2ML SOAJ  Inject into the skin in the event of low sugar.   GLUCOSE BLOOD (FREESTYLE LITE) TEST STRIP    Use daily to check blood sugar.   GLUCOSE BLOOD (FREESTYLE LITE) TEST STRIP    Use daily to check blood sugar.   GLYBURIDE  (DIABETA ) 2.5 MG TABLET    Take 1 tablet (2.5 mg total) by mouth daily with breakfast.   KETOCONAZOLE  (NIZORAL ) 2 % SHAMPOO    Apply 1 Application topically 2 (two) times a week. Leave on for 5 min before rinsing.   LANCETS (FREESTYLE) LANCETS    Use daily to check blood sugar   LEVOCETIRIZINE (XYZAL ) 5 MG TABLET    Take 1 tablet (5 mg total) by mouth every evening.   LOSARTAN  (COZAAR ) 25 MG TABLET    Take 1 tablet (25 mg total) by mouth daily.   MECLIZINE  (ANTIVERT ) 25 MG TABLET    Take 1 tablet (25 mg total) by mouth 3 (three) times daily as needed for dizziness.   METFORMIN  (GLUCOPHAGE -XR) 500 MG 24 HR TABLET    Take 2 tablets (1,000 mg total) by mouth 2 (two) times daily with a meal.   METHOCARBAMOL  (ROBAXIN ) 500 MG TABLET    Take 1 tablet (500 mg total) by mouth every 6 (six) hours as needed for muscle spasms.   METHYLPHENIDATE  (RITALIN ) 20 MG TABLET    Take 1 tablet (20 mg total) by mouth 2 (two) times daily.   METHYLPHENIDATE  (RITALIN ) 20 MG  TABLET    Take 1 tablet (20 mg total) by mouth 2 (two) times daily.   METHYLPHENIDATE  (RITALIN ) 20 MG TABLET    Take 1 tablet (20 mg total) by mouth 2 (two) times daily.   MONTELUKAST  (SINGULAIR ) 10 MG TABLET    Take 1 tablet (10 mg total) by mouth daily.   MULTIPLE VITAMIN (MULTIVITAMIN WITH MINERALS) TABS TABLET    Take 1 tablet by mouth in the morning. One A Day   NITROGLYCERIN  (NITROSTAT ) 0.4 MG SL TABLET    Place 0.4 mg under the tongue every 5 (five) minutes x 3 doses as needed for chest pain.   OXYCODONE -ACETAMINOPHEN  (PERCOCET/ROXICET) 5-325 MG TABLET    Take 1-2 tablets by mouth every 6 (six) hours as needed for severe pain (pain score 7-10) or moderate pain (pain score 4-6).   PANTOPRAZOLE  (PROTONIX ) 40 MG TABLET    Take 1 tablet (40 mg total) by mouth daily.   PROBIOTIC PRODUCT (PROBIOTIC PO)    Take 2 capsules by mouth in the morning.   ROSUVASTATIN  (CRESTOR ) 5 MG TABLET    Take 1 tablet (5 mg total) by mouth daily.   SILDENAFIL  (VIAGRA ) 100 MG TABLET    Take 1 tablet (100 mg total) by mouth daily as needed. Take 1 hour prior to sexual activity   TESTOSTERONE  (ANDROGEL ) 50 MG/5GM (1%) GEL    Apply 2 applications onto the skin daily.   VITAMIN D , ERGOCALCIFEROL , (DRISDOL ) 1.25 MG (50000 UNIT) CAPS CAPSULE    Take 1 capsule (50,000 Units total) by mouth every 7 (seven) days.  Modified Medications   Modified Medication Previous Medication   PIOGLITAZONE  (ACTOS ) 45 MG TABLET pioglitazone  (ACTOS ) 45 MG tablet      Take 1 tablet (45 mg total) by mouth daily.    Take 1 tablet (45 mg total) by mouth daily.  Discontinued Medications   No medications on file    Allergies Allergies  Allergen Reactions   Invokana  [Canagliflozin ] Rash   Grass Pollen(K-O-R-T-Swt Vern)  Lisinopril  Cough   Semaglutide  Nausea Only    Causes cramping. 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 No issues now   Hemarthrosis of right elbow 02/03/2016   Hoarseness 04/13/2019   Hyperlipidemia    family hx of high cholesterol   Hypertension    Hypogonadism in male    Incomplete rotator cuff tear 09/10/2015   Injected 09/10/2015   Lateral epicondylitis of right elbow 07/28/2019   Low back pain at multiple sites 02/05/2021   Low vitamin D  level 04/22/2017   Mild persistent asthma/cough variant asthma 04/13/2019   Myofascial pain syndrome, cervical 07/28/2019   Neck pain 06/01/2018   Nonischemic cardiomyopathy (HCC) 05/15/2015   Nontraumatic incomplete tear of right rotator cuff 07/25/2019   Occipital headache 06/01/2018   Onychomycosis 05/25/2018   Osteoarthritis    Pain in right ankle and joints of right foot 10/05/2016   Palpitations 07/06/2017   Pernicious anemia    PONV (postoperative nausea and vomiting)    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   Shoulder bursitis 10/12/2014   Injected in 10/12/2014  Repeat 01/21/2015   Small thenar eminence 02/03/2016   Spinal  stenosis, lumbar region, without neurogenic claudication 10/12/2014   Spondylolisthesis at L5-S1 level 05/09/2021   Strain of latissimus dorsi muscle 01/21/2015   Tendinopathy of right biceps tendon 07/25/2019   Type 2 diabetes mellitus with hyperglycemia, without long-term current use of insulin  (HCC) 12/22/2013   Ulnar neuropathy at elbow of right upper extremity 01/03/2019    Past Surgical History Past Surgical History:  Procedure Laterality Date   CATARACT EXTRACTION W/ INTRAOCULAR LENS IMPLANT Bilateral 2023   COLONOSCOPY     CORONARY PRESSURE/FFR STUDY N/A 06/23/2017   Procedure: INTRAVASCULAR PRESSURE WIRE/FFR STUDY;  Surgeon: Anner Alm ORN, MD;  Location: [facility] INVASIVE CV LAB;  Service: Cardiovascular;  Laterality: N/A;   CORONARY STENT INTERVENTION N/A 06/23/2017   Procedure: CORONARY STENT INTERVENTION;  Surgeon: Anner Alm ORN, MD;  Location[facility] INVASIVE CV LAB;  Service: Cardiovascular;  Laterality: N/A;   KNEE ARTHROSCOPY Left    LEFT HEART CATH AND CORONARY ANGIOGRAPHY N/A 06/23/2017   Procedure: LEFT HEART CATH AND CORONARY ANGIOGRAPHY;  Surgeon: Anner Alm ORN, MD;  Location: [facility] INVASIVE CV LAB;  Service: Cardiovascular;  Laterality: N/A;   LEFT HEART CATH AND CORONARY ANGIOGRAPHY N/A 07/14/2023   Procedure: LEFT HEART CATH AND CORONARY ANGIOGRAPHY;  Surgeon: Elmira Newman PARAS, MD;  Location: [facility] INVASIVE CV LAB;  Service: Cardiovascular;  Laterality: N/A;   SHOULDER ARTHROSCOPY WITH BICEPSTENOTOMY Right 09/06/2019   Procedure: SHOULDER ARTHROSCOPY WITH BICEPSTENOTOMY;  Surgeon: Jerri Kay HERO, MD;  Location: [facility];  Service: Orthopedics;  Laterality: Right;   SHOULDER ARTHROSCOPY WITH SUBACROMIAL DECOMPRESSION Right 09/06/2019   Procedure: RIGHT SHOULDER ARTHROSCOPY WITH EXTENSIVE DEBRIDEMENT, SUBACROMIAL DECOMPRESSION, BICEPS TENOTOMY;  Surgeon: Jerri Kay HERO, MD;  Location: [facility];  Service: Orthopedics;  Laterality: Right;    TENOTOMY ACHILLES TENDON  2007   In [place], [place]   ULNAR NERVE TRANSPOSITION  2006   In [place], [place]    Family History family history includes Asthma in his paternal aunt; Cancer in his mother; Coronary artery disease (age of onset: 42) in his father; Food Allergy in his son; Heart disease in his father; Hypothyroidism in his brother; Lung cancer (age of onset: 92) in his mother.  Social History Social History   Socioeconomic History   Marital status: Married    Spouse name: Not on file   Number of children: Not on file   Years of education: Not on file   Highest education level: Some college, no degree  Occupational History   Occupation: Conservation officer, historic buildings   Tobacco Use   Smoking status: Never    Passive exposure: Never   Smokeless tobacco: Never  Vaping Use   Vaping status: Never Used  Substance and Sexual Activity   Alcohol use: Not Currently    Comment: 1-2 drinks/monthly   Drug use: No   Sexual activity: Yes  Other Topics Concern   Not on file  Social History Narrative   Previously worked as Environmental manager   Currently working at [employer]   Married 30 years   2 children ages 72, 42   Wife is psychiatrist - Dr. Adrien Gull   Moved from [place]   Grew up in [place]   Social Drivers of Health   Financial Resource Strain: Low Risk  (12/17/2023)   Overall Financial Resource Strain (CARDIA)    Difficulty of Paying Living Expenses: Not very hard  Food Insecurity: No Food Insecurity (12/17/2023)   Hunger Vital Sign    Worried About Running Out of Food in the Last Year: Never true    Ran Out of Food in the Last Year: Never true  Transportation Needs: No Transportation Needs (12/17/2023)   PRAPARE - Administrator, Civil Service (Medical): No    Lack of Transportation (Non-Medical): No  Physical Activity: Insufficiently Active (12/17/2023)   Exercise Vital Sign    Days of Exercise per Week: 7 days    Minutes of Exercise per Session: 10 min  Stress:  No Stress Concern Present (12/17/2023)   [employer] of Occupational Health - Occupational Stress Questionnaire    Feeling of Stress: Not at all  Social Connections: Moderately Isolated (12/17/2023)   Social Connection and Isolation Panel    Frequency of Communication with Friends and Family: More than three times a week    Frequency of Social Gatherings with Friends and Family: Three times a week    Attends Religious Services: Never    Active Member of Clubs or Organizations: No    Attends Banker Meetings: Not on file    Marital Status: Married  Intimate Partner Violence: Not on file    Lab Results  Component Value Date   HGBA1C 7.1 (A) 01/11/2024   HGBA1C 7.2 (A) 10/11/2023   HGBA1C 9.3 (A) 07/08/2023   Lab Results  Component Value Date  CHOL 114 04/13/2023   Lab Results  Component Value Date   HDL 45.00 04/13/2023   Lab Results  Component Value Date   LDLCALC 55 04/13/2023   Lab Results  Component Value Date   TRIG 67.0 04/13/2023   Lab Results  Component Value Date   CHOLHDL 3 04/13/2023   Lab Results  Component Value Date   CREATININE 0.98 10/26/2023   Lab Results  Component Value Date   GFR 91.25 08/09/2023   Lab Results  Component Value Date   MICROALBUR 2.3 (H) 08/09/2023      Component Value Date/Time   NA 138 10/26/2023 0748   NA 139 04/22/2022 1439   K 3.9 10/26/2023 0748   CL 106 10/26/2023 0748   CO2 21 (L) 10/26/2023 0748   GLUCOSE 252 (H) 10/26/2023 0748   BUN 14 10/26/2023 0748   BUN 12 04/22/2022 1439   CREATININE 0.98 10/26/2023 0748   CREATININE 0.82 07/05/2023 1008   CALCIUM  8.2 (L) 10/26/2023 0748   PROT 7.2 08/09/2023 1428   PROT 6.8 04/22/2022 1439   ALBUMIN  4.6 08/09/2023 1428   ALBUMIN  4.6 04/22/2022 1439   AST 19 08/09/2023 1428   AST 19 03/06/2022 1331   ALT 26 08/09/2023 1428   ALT 20 03/06/2022 1331   ALKPHOS 66 08/09/2023 1428   BILITOT 0.4 08/09/2023 1428   BILITOT 0.3 04/22/2022 1439    BILITOT 0.4 03/06/2022 1331   GFRNONAA >60 10/26/2023 0748   GFRNONAA >60 03/06/2022 1331   GFRAA >60 08/30/2019 1300      Latest Ref Rng & Units 10/26/2023    7:48 AM 10/25/2023    1:08 PM 10/25/2023   12:02 PM  BMP  Glucose 70 - 99 mg/dL 747     BUN 8 - 23 mg/dL 14     Creatinine 9.38 - 1.24 mg/dL 9.01     Sodium 864 - 854 mmol/L 138  141  140   Potassium 3.5 - 5.1 mmol/L 3.9  4.3  4.4   Chloride 98 - 111 mmol/L 106     CO2 22 - 32 mmol/L 21     Calcium  8.9 - 10.3 mg/dL 8.2          Component Value Date/Time   WBC 7.6 10/26/2023 0748   RBC 2.66 (L) 10/26/2023 0748   HGB 8.4 (L) 10/26/2023 0748   HGB 12.5 (L) 03/06/2022 1331   HGB 13.1 06/11/2017 1302   HCT 25.8 (L) 10/26/2023 0748   HCT 38.8 06/11/2017 1302   PLT 144 (L) 10/26/2023 0748   PLT 227 03/06/2022 1331   PLT 235 06/11/2017 1302   MCV 97.0 10/26/2023 0748   MCV 96 06/11/2017 1302   MCH 31.6 10/26/2023 0748   MCHC 32.6 10/26/2023 0748   RDW 17.4 (H) 10/26/2023 0748   RDW 14.7 06/11/2017 1302   LYMPHSABS 1.5 07/05/2023 1614   LYMPHSABS 1.9 04/21/2017 1019   MONOABS 0.4 07/05/2023 1614   EOSABS 0.1 07/05/2023 1614   EOSABS 0.1 04/21/2017 1019   BASOSABS 0.1 07/05/2023 1614   BASOSABS 0.0 04/21/2017 1019     Parts of this note may have been dictated using voice recognition software. There may be variances in spelling and vocabulary which are unintentional. Not all errors are proofread. Please notify the dino if any discrepancies are noted or if the meaning of any statement is not clear.

## 2024-01-11 NOTE — Patient Instructions (Signed)

## 2024-01-13 ENCOUNTER — Other Ambulatory Visit (HOSPITAL_BASED_OUTPATIENT_CLINIC_OR_DEPARTMENT_OTHER): Payer: Self-pay

## 2024-01-13 ENCOUNTER — Other Ambulatory Visit (HOSPITAL_COMMUNITY): Payer: Self-pay

## 2024-01-13 ENCOUNTER — Ambulatory Visit (INDEPENDENT_AMBULATORY_CARE_PROVIDER_SITE_OTHER): Admitting: *Deleted

## 2024-01-13 DIAGNOSIS — E538 Deficiency of other specified B group vitamins: Secondary | ICD-10-CM

## 2024-01-13 MED ORDER — CYANOCOBALAMIN 1000 MCG/ML IJ SOLN
1000.0000 ug | Freq: Once | INTRAMUSCULAR | Status: AC
  Administered 2024-01-13: 1000 ug via INTRAMUSCULAR

## 2024-01-13 NOTE — Progress Notes (Signed)
 Pt in for monthly B12 injection per pcp order on 08/09/23.  Given right deltoid.  Next injection scheduled for 02/11/24.

## 2024-01-14 DIAGNOSIS — M4317 Spondylolisthesis, lumbosacral region: Secondary | ICD-10-CM | POA: Diagnosis not present

## 2024-01-17 ENCOUNTER — Other Ambulatory Visit: Payer: Self-pay

## 2024-01-17 DIAGNOSIS — H43812 Vitreous degeneration, left eye: Secondary | ICD-10-CM | POA: Diagnosis not present

## 2024-01-17 DIAGNOSIS — H35372 Puckering of macula, left eye: Secondary | ICD-10-CM | POA: Diagnosis not present

## 2024-01-17 DIAGNOSIS — H35342 Macular cyst, hole, or pseudohole, left eye: Secondary | ICD-10-CM | POA: Diagnosis not present

## 2024-01-17 DIAGNOSIS — H35363 Drusen (degenerative) of macula, bilateral: Secondary | ICD-10-CM | POA: Diagnosis not present

## 2024-01-17 DIAGNOSIS — H59811 Chorioretinal scars after surgery for detachment, right eye: Secondary | ICD-10-CM | POA: Diagnosis not present

## 2024-01-17 DIAGNOSIS — H43392 Other vitreous opacities, left eye: Secondary | ICD-10-CM | POA: Diagnosis not present

## 2024-01-18 DIAGNOSIS — I10 Essential (primary) hypertension: Secondary | ICD-10-CM | POA: Insufficient documentation

## 2024-01-18 DIAGNOSIS — Z9889 Other specified postprocedural states: Secondary | ICD-10-CM | POA: Insufficient documentation

## 2024-01-19 ENCOUNTER — Ambulatory Visit

## 2024-01-19 VITALS — BP 110/60 | HR 82 | Ht 70.0 in | Wt 175.4 lb

## 2024-01-19 DIAGNOSIS — E782 Mixed hyperlipidemia: Secondary | ICD-10-CM | POA: Diagnosis not present

## 2024-01-19 DIAGNOSIS — I2510 Atherosclerotic heart disease of native coronary artery without angina pectoris: Secondary | ICD-10-CM | POA: Diagnosis not present

## 2024-01-19 DIAGNOSIS — I10 Essential (primary) hypertension: Secondary | ICD-10-CM

## 2024-01-19 NOTE — Progress Notes (Signed)
 Cardiology Consultation:    Date:  01/19/2024   ID:  Reginald Rios, DOB [birth year], MRN [id number]  PCP:  Frann Mabel Mt, DO  Cardiologist:  Alean SAUNDERS Kaiyon Hynes, MD   Referring MD: Frann Mabel Mt*   No chief complaint on file.    ASSESSMENT AND PLAN:   Reginald Rios 68 year old male with history of  CAD with prior PCI of LCx February 2019, prior stress echocardiogram June 2023 with good exercise capacity and no ischemia, with progressive symptoms of shortness of breath concerning for unstable angina underwent cardiac catheterization 07-14-2023 that noted mid LAD 40 to 50% disease unchanged from prior cath in 2019, patent LCx stents, 40% proximal OM 2 disease unchanged from prior cath, very distal L PDA 90% stenosis in a small caliber vessel which was once again unchanged and overall findings were felt to be stable in comparison to prior.  Continued medical therapy recommended.  Echocardiogram July 13, 2023 noted normal biventricular function with EF 60 to 65% and normal diastolic function, mild tricuspid regurgitation. He also has history of diabetes mellitus type 2, hypertension, hyperlipidemia, sinus bradycardia, vertigo, chronic back pain, asthma, s/p lumbosacral fusion surgery 10/25/2023.[name]   Here for follow-up visit. Problem List Items Addressed This Visit     Hyperlipidemia   Last lipid panel from December 2024 reviewed, optimal. Continue rosuvastatin  5 mg once daily and Zetia  10 mg once daily.       CAD (coronary artery disease) - Primary   Doing well, asymptomatic. Continue single antiplatelet agent clopidogrel  75 mg once daily. Continue rosuvastatin .       Hypertension   Well-controlled. Continue amlodipine  2.5 mg once daily. Continue losartan  25 mg once daily. Target below 130/80 mmHg.       Recommended to follow-up with PCP with regards to follow-up CBC to check up on previously documented anemia. No active bleeding concerns at this  time.  Follow-up with cardiology tentatively in 1 year or as needed.    History of Present Illness:    Reginald Rios is a 68 y.o. male who is being seen today for follow-up visit. PCP is Frann, Mabel Mt*. Last visit with me in the office was 10/18/2023.   CAD with prior PCI of LCx February 2019, prior stress echocardiogram June 2023 with good exercise capacity and no ischemia, with progressive symptoms of shortness of breath concerning for unstable angina underwent cardiac catheterization 07-14-2023 that noted mid LAD 40 to 50% disease unchanged from prior cath in 2019, patent LCx stents, 40% proximal OM 2 disease unchanged from prior cath, very distal L PDA 90% stenosis in a small caliber vessel which was once again unchanged and overall findings were felt to be stable in comparison to prior.  Continued medical therapy recommended.  Echocardiogram July 13, 2023 noted normal biventricular function with EF 60 to 65% and normal diastolic function, mild tricuspid regurgitation. He also has history of diabetes mellitus type 2, hypertension, hyperlipidemia, sinus bradycardia, vertigo, chronic back pain, asthma.  At last visit was seen for preop cardiovascular assessment prior to lumbar fusion with Dr. Delaine and underwent surgery subsequently on 10/25/2023   Last lipid panel to review is from 04/13/2023 total cholesterol 114, HDL 45, LDL 55, triglycerides 67. Repeat lipid panel standing order from 01/11/2024 noted in the chart.  Overall doing well and recovering well from his recent spinal surgery. He has got be okay to proceed with physical therapy from the surgeon.  From cardiac standpoint he does not have any active  symptoms of chest pain or palpitations.  No shortness of breath orthopnea pedal edema. He does have mild symptoms of pain going down the left forearm and right anterior part of the leg which has been a chronic issue associated with mild weakness.  No sudden change in  strength. No falls.  Good compliance with his medications at home. Recently received blood transfusions after his surgery for anemia.  No follow-up CBC since then. Denies any blood in urine or stools.   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 No issues now   Hemarthrosis of right elbow 02/03/2016   Hoarseness 04/13/2019   Hyperlipidemia    family hx of high cholesterol   Hypertension    Hypogonadism in male    Incomplete rotator cuff tear 09/10/2015   Injected 09/10/2015   Lateral epicondylitis of right elbow 07/28/2019   Low back pain at multiple sites 02/05/2021   Low vitamin D  level 04/22/2017   Mild persistent asthma/cough variant asthma 04/13/2019   Myofascial pain syndrome, cervical 07/28/2019   Neck pain 06/01/2018   Nonischemic cardiomyopathy (HCC) 05/15/2015   Nontraumatic incomplete tear of right rotator cuff 07/25/2019   Occipital headache 06/01/2018   Onychomycosis 05/25/2018   Osteoarthritis     Pain in right ankle and joints of right foot 10/05/2016   Palpitations 07/06/2017   Pernicious anemia    PONV (postoperative nausea and vomiting)    Preop cardiovascular exam 10/18/2023   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   Shoulder bursitis 10/12/2014   Injected in 10/12/2014  Repeat 01/21/2015   Small thenar eminence 02/03/2016   Spinal stenosis, lumbar region, without neurogenic claudication 10/12/2014   Spondylolisthesis at L5-S1 level 05/09/2021   Strain of latissimus dorsi muscle 01/21/2015   Tendinopathy of right biceps tendon 07/25/2019   Type 2 diabetes mellitus with hyperglycemia, without long-term current use of insulin  (HCC) 12/22/2013   Ulnar neuropathy at elbow of right upper extremity 01/03/2019    Past Surgical History:  Procedure Laterality Date   CATARACT EXTRACTION W/ INTRAOCULAR LENS IMPLANT Bilateral 2023   COLONOSCOPY     CORONARY PRESSURE/FFR STUDY N/A 06/23/2017   Procedure: INTRAVASCULAR PRESSURE WIRE/FFR STUDY;  Surgeon: Anner Alm ORN, MD;  Location: [facility]IVE [facility];  Service: Cardiovascular;  Laterality: N/A;   CORONARY STENT INTERVENTION N/A 06/23/2017   Procedure: CORONARY STENT INTERVENTION;  Surgeon: Anner Alm ORN, MD;  Location: [facility] CV [facility];  Service: Cardiovascular;  Laterality: N/A;   KNEE ARTHROSCOPY Left    LEFT HEART CATH AND CORONARY ANGIOGRAPHY N/A  06/23/2017   Procedure: LEFT HEART CATH AND CORONARY ANGIOGRAPHY;  Surgeon: Anner Alm ORN, MD;  Location: [facility] INVASIVE CV LAB;  Service: Cardiovascular;  Laterality: N/A;   LEFT HEART CATH AND CORONARY ANGIOGRAPHY N/A 07/14/2023   Procedure: LEFT HEART CATH AND CORONARY ANGIOGRAPHY;  Surgeon: Elmira Newman PARAS, MD;  Location: [facility] INVASIVE CV LAB;  Service: Cardiovascular;  Laterality: N/A;   SHOULDER ARTHROSCOPY WITH BICEPSTENOTOMY Right 09/06/2019   Procedure: SHOULDER ARTHROSCOPY WITH BICEPSTENOTOMY;  Surgeon: Jerri Kay HERO, MD;  Location: [facility];  Service:  Orthopedics;  Laterality: Right;   SHOULDER ARTHROSCOPY WITH SUBACROMIAL DECOMPRESSION Right 09/06/2019   Procedure: RIGHT SHOULDER ARTHROSCOPY WITH EXTENSIVE DEBRIDEMENT, SUBACROMIAL DECOMPRESSION, BICEPS TENOTOMY;  Surgeon: Jerri Kay HERO, MD;  Location: [facility];  Service: Orthopedics;  Laterality: Right;   TENOTOMY ACHILLES TENDON  2007   In [place], [place]   ULNAR NERVE TRANSPOSITION  2006   In [place], [place]    Current Medications: Current Meds  Medication Sig   acetaminophen  (TYLENOL ) 500 MG tablet Take 1,000 mg by mouth every 6 (six) hours as needed for moderate pain (pain score 4-6).   albuterol  (VENTOLIN  HFA) 108 (90 Base) MCG/ACT inhaler Inhale 2 puffs into the lungs every 4 (four) to 6 (six) hours as needed for coughing or wheezing spells.   amLODipine  (NORVASC ) 2.5 MG tablet Take 1 tablet (2.5 mg total) by mouth daily.   azelastine  (ASTELIN ) 0.1 % nasal spray Place 2 sprays into both nostrils 2 (two) times daily as needed for rhinitis (nasal congestion). (Patient taking differently: Place 2 sprays into both nostrils at bedtime.)   Blood Glucose Monitoring Suppl (FREESTYLE FREEDOM LITE) w/Device KIT Use to check blood sugar daily.   clopidogrel  (PLAVIX ) 75 MG tablet Take 1 tablet (75 mg total) by mouth daily.   diclofenac  Sodium (VOLTAREN ) 1 % GEL Apply 2 g topically 4 (four) times daily. (Patient taking differently: Apply 2 g topically 4 (four) times daily as needed (pain).)   empagliflozin  (JARDIANCE ) 25 MG TABS tablet Take 1 tablet (25 mg total) by mouth daily before breakfast.   ezetimibe  (ZETIA ) 10 MG tablet Take 1 tablet (10 mg total) by mouth daily.   fluticasone  (FLONASE ) 50 MCG/ACT nasal spray Place 2 sprays into both nostrils daily.   Glucagon  (GVOKE HYPOPEN  1-PACK) 1 MG/0.2ML SOAJ Inject into the skin in the event of low sugar.   glucose blood (FREESTYLE LITE) test strip Use daily to check blood sugar.   glucose blood (FREESTYLE LITE) test strip Use daily  to check blood sugar.   glyBURIDE  (DIABETA ) 2.5 MG tablet Take 1 tablet (2.5 mg total) by mouth daily with breakfast. (Patient taking differently: Take 2.5 mg by mouth daily as needed (elevated blood sugar).)   ketoconazole  (NIZORAL ) 2 % shampoo Apply 1 Application topically 2 (two) times a week. Leave on for 5 min before rinsing.   Lancets (FREESTYLE) lancets Use daily to check blood sugar   levocetirizine (XYZAL ) 5 MG tablet Take 1 tablet (5 mg total) by mouth every evening.   losartan  (COZAAR ) 25 MG tablet Take 1 tablet (25 mg total) by mouth daily.   meclizine  (ANTIVERT ) 25 MG tablet Take 1 tablet (25 mg total) by mouth 3 (three) times daily as needed for dizziness.   metFORMIN  (GLUCOPHAGE -XR) 500 MG 24 hr tablet Take 2 tablets (1,000 mg total) by mouth 2 (two) times daily with a meal.   methocarbamol  (ROBAXIN ) 500 MG tablet Take 1 tablet (500 mg total) by mouth  every 6 (six) hours as needed for muscle spasms.   methylphenidate  (RITALIN ) 20 MG tablet Take 1 tablet (20 mg total) by mouth 2 (two) times daily.   [START ON 02/05/2024] methylphenidate  (RITALIN ) 20 MG tablet Take 1 tablet (20 mg total) by mouth 2 (two) times daily.   methylphenidate  (RITALIN ) 20 MG tablet Take 1 tablet (20 mg total) by mouth 2 (two) times daily.   montelukast  (SINGULAIR ) 10 MG tablet Take 1 tablet (10 mg total) by mouth daily.   Multiple Vitamin (MULTIVITAMIN WITH MINERALS) TABS tablet Take 1 tablet by mouth in the morning. One A Day   nitroGLYCERIN  (NITROSTAT ) 0.4 MG SL tablet Place 0.4 mg under the tongue every 5 (five) minutes x 3 doses as needed for chest pain.   oxyCODONE -acetaminophen  (PERCOCET/ROXICET) 5-325 MG tablet Take 1-2 tablets by mouth every 6 (six) hours as needed for severe pain (pain score 7-10) or moderate pain (pain score 4-6).   pantoprazole  (PROTONIX ) 40 MG tablet Take 1 tablet (40 mg total) by mouth daily.   pioglitazone  (ACTOS ) 45 MG tablet Take 1 tablet (45 mg total) by mouth daily.    Probiotic Product (PROBIOTIC PO) Take 2 capsules by mouth in the morning.   rosuvastatin  (CRESTOR ) 5 MG tablet Take 1 tablet (5 mg total) by mouth daily.   sildenafil  (VIAGRA ) 100 MG tablet Take 1 tablet (100 mg total) by mouth daily as needed. Take 1 hour prior to sexual activity   testosterone  (ANDROGEL ) 50 MG/5GM (1%) GEL Apply 2 applications onto the skin daily.   Vitamin D , Ergocalciferol , (DRISDOL ) 1.25 MG (50000 UNIT) CAPS capsule Take 1 capsule (50,000 Units total) by mouth every 7 (seven) days.     Allergies:   Invokana  [canagliflozin ], Grass pollen([id number]-swt vern), Lisinopril , and Semaglutide    Social History   Socioeconomic History   Marital status: Married    Spouse name: Not on file   Number of children: Not on file   Years of education: Not on file   Highest education level: Some college, no degree  Occupational History   Occupation: Conservation officer, historic buildings   Tobacco Use   Smoking status: Never    Passive exposure: Never   Smokeless tobacco: Never  Vaping Use   Vaping status: Never Used  Substance and Sexual Activity   Alcohol use: Not Currently    Comment: 1-2 drinks/monthly   Drug use: No   Sexual activity: Yes  Other Topics Concern   Not on file  Social History Narrative   Previously worked as Environmental manager   Currently working at [employer]   Married 30 years   2 children ages 76, 74   Wife is psychiatrist - Dr. Adrien Gull   Moved from [place]   Grew up in [place]   Social Drivers of Health   Financial Resource Strain: Low Risk  (12/17/2023)   Overall Financial Resource Strain (CARDIA)    Difficulty of Paying Living Expenses: Not very hard  Food Insecurity: No Food Insecurity (12/17/2023)   Hunger Vital Sign    Worried About Running Out of Food in the Last Year: Never true    Ran Out of Food in the Last Year: Never true  Transportation Needs: No Transportation Needs (12/17/2023)   PRAPARE - Administrator, Civil Service (Medical): No     Lack of Transportation (Non-Medical): No  Physical Activity: Insufficiently Active (12/17/2023)   Exercise Vital Sign    Days of Exercise per Week: 7 days    Minutes of Exercise  per Session: 10 min  Stress: No Stress Concern Present (12/17/2023)   [employer] of Occupational Health - Occupational Stress Questionnaire    Feeling of Stress: Not at all  Social Connections: Moderately Isolated (12/17/2023)   Social Connection and Isolation Panel    Frequency of Communication with Friends and Family: More than three times a week    Frequency of Social Gatherings with Friends and Family: Three times a week    Attends Religious Services: Never    Active Member of Clubs or Organizations: No    Attends Engineer, structural: Not on file    Marital Status: Married     Family History: The patient's family history includes Asthma in his paternal aunt; Cancer in his mother; Coronary artery disease (age of onset: 8) in his father; Food Allergy in his son; Heart disease in his father; Hypothyroidism in his brother; Lung cancer (age of onset: 38) in his mother. There is no history of Colon cancer, Prostate cancer, Allergic rhinitis, Angioedema, Eczema, Immunodeficiency, or Urticaria. ROS:   Please see the history of present illness.    All 14 point review of systems negative except as described per history of present illness.  EKGs/Labs/Other Studies Reviewed:    The following studies were reviewed today:   EKG:       Recent Labs: 07/05/2023: TSH 2.63 08/09/2023: ALT 26 10/26/2023: BUN 14; Creatinine, Ser 0.98; Hemoglobin 8.4; Platelets 144; Potassium 3.9; Sodium 138  Recent Lipid Panel    Component Value Date/Time   CHOL 114 04/13/2023 0900   CHOL 156 04/22/2022 1439   TRIG 67.0 04/13/2023 0900   HDL 45.00 04/13/2023 0900   HDL 53 04/22/2022 1439   CHOLHDL 3 04/13/2023 0900   VLDL 13.4 04/13/2023 0900   LDLCALC 55 04/13/2023 0900   LDLCALC 89 04/22/2022 1439    Physical Exam:     VS:  BP 110/60   Pulse 82   Ht 5' 10 (1.778 m)   Wt 175 lb 6.4 oz (79.6 kg)   SpO2 96%   BMI 25.17 kg/m     Wt Readings from Last 3 Encounters:  01/19/24 175 lb 6.4 oz (79.6 kg)  01/11/24 175 lb (79.4 kg)  12/22/23 173 lb 12.8 oz (78.8 kg)     GENERAL:  Well nourished, well developed in no acute distress NECK: No JVD; No carotid bruits CARDIAC: RRR, S1 and S2 present, no murmurs, no rubs, no gallops CHEST:  Clear to auscultation without rales, wheezing or rhonchi  Extremities: No pitting pedal edema. Pulses bilaterally symmetric with radial 2+ and dorsalis pedis 2+ NEUROLOGIC:  Alert and oriented x 3  Medication Adjustments/Labs and Tests Ordered: Current medicines are reviewed at length with the patient today.  Concerns regarding medicines are outlined above.  No orders of the defined types were placed in this encounter.  No orders of the defined types were placed in this encounter.   Signed, Lydian Chavous reddy Rhanda Lemire, MD, MPH, [facility]. 01/19/2024 1:35 PM    [facility]

## 2024-01-19 NOTE — Assessment & Plan Note (Signed)
 Doing well, asymptomatic. Continue single antiplatelet agent clopidogrel  75 mg once daily. Continue rosuvastatin .

## 2024-01-19 NOTE — Assessment & Plan Note (Signed)
 Well-controlled. Continue amlodipine  2.5 mg once daily. Continue losartan  25 mg once daily. Target below 130/80 mmHg.

## 2024-01-19 NOTE — Assessment & Plan Note (Signed)
 Last lipid panel from December 2024 reviewed, optimal. Continue rosuvastatin  5 mg once daily and Zetia  10 mg once daily.

## 2024-01-19 NOTE — Patient Instructions (Signed)
 Medication Instructions:  Your physician recommends that you continue on your current medications as directed. Please refer to the Current Medication list given to you today.  *If you need a refill on your cardiac medications before your next appointment, please call your pharmacy*  Lab Work: None If you have labs (blood work) drawn today and your tests are completely normal, you will receive your results only by: MyChart Message (if you have MyChart) OR A paper copy in the mail If you have any lab test that is abnormal or we need to change your treatment, we will call you to review the results.  Testing/Procedures: None  Follow-Up: At [facility], you and your health needs are our priority.  As part of our continuing mission to provide you with exceptional heart care, our providers are all part of one team.  This team includes your primary Cardiologist (physician) and Advanced Practice Providers or APPs (Physician Assistants and Nurse Practitioners) who all work together to provide you with the care you need, when you need it.  Your next appointment:   1 year(s)  Provider:   Huntley Dec, MD    We recommend signing up for the patient portal called "MyChart".  Sign up information is provided on this After Visit Summary.  MyChart is used to connect with patients for Virtual Visits (Telemedicine).  Patients are able to view lab/test results, encounter notes, upcoming appointments, etc.  Non-urgent messages can be sent to your provider as well.   To learn more about what you can do with MyChart, go to [email or web address].   Other Instructions None

## 2024-01-21 ENCOUNTER — Other Ambulatory Visit: Payer: Self-pay

## 2024-01-21 ENCOUNTER — Other Ambulatory Visit (HOSPITAL_COMMUNITY): Payer: Self-pay

## 2024-02-07 DIAGNOSIS — H35372 Puckering of macula, left eye: Secondary | ICD-10-CM | POA: Diagnosis not present

## 2024-02-07 DIAGNOSIS — H33322 Round hole, left eye: Secondary | ICD-10-CM | POA: Diagnosis not present

## 2024-02-07 DIAGNOSIS — H43392 Other vitreous opacities, left eye: Secondary | ICD-10-CM | POA: Diagnosis not present

## 2024-02-07 DIAGNOSIS — H43812 Vitreous degeneration, left eye: Secondary | ICD-10-CM | POA: Diagnosis not present

## 2024-02-08 ENCOUNTER — Other Ambulatory Visit (HOSPITAL_BASED_OUTPATIENT_CLINIC_OR_DEPARTMENT_OTHER): Payer: Self-pay

## 2024-02-08 ENCOUNTER — Ambulatory Visit: Admitting: Family Medicine

## 2024-02-08 ENCOUNTER — Ambulatory Visit: Payer: Self-pay | Admitting: Family Medicine

## 2024-02-08 ENCOUNTER — Encounter: Payer: Self-pay | Admitting: Family Medicine

## 2024-02-08 VITALS — BP 120/70 | HR 98 | Temp 98.0°F | Resp 16 | Ht 70.0 in | Wt 178.9 lb

## 2024-02-08 DIAGNOSIS — Z0000 Encounter for general adult medical examination without abnormal findings: Secondary | ICD-10-CM | POA: Diagnosis not present

## 2024-02-08 DIAGNOSIS — Z23 Encounter for immunization: Secondary | ICD-10-CM | POA: Diagnosis not present

## 2024-02-08 LAB — LIPID PANEL
Cholesterol: 132 mg/dL (ref 0–200)
HDL: 58.3 mg/dL (ref >39.00)
LDL Cholesterol: 58 mg/dL (ref 0–99)
NonHDL: 73.82
Total CHOL/HDL Ratio: 2
Triglycerides: 77 mg/dL (ref 0.0–149.0)
VLDL: 15.4 mg/dL (ref 0.0–40.0)

## 2024-02-08 LAB — COMPREHENSIVE METABOLIC PANEL WITH GFR
ALT: 16 U/L (ref 0–53)
AST: 18 U/L (ref 0–37)
Albumin: 4.7 g/dL (ref 3.5–5.2)
Alkaline Phosphatase: 84 U/L (ref 39–117)
BUN: 11 mg/dL (ref 6–23)
CO2: 27 meq/L (ref 19–32)
Calcium: 9.7 mg/dL (ref 8.4–10.5)
Chloride: 104 meq/L (ref 96–112)
Creatinine, Ser: 0.66 mg/dL (ref 0.40–1.50)
GFR: 94.89 mL/min (ref >60.00)
Glucose, Bld: 115 mg/dL — ABNORMAL HIGH (ref 70–99)
Potassium: 4.2 meq/L (ref 3.5–5.1)
Sodium: 142 meq/L (ref 135–145)
Total Bilirubin: 0.4 mg/dL (ref 0.2–1.2)
Total Protein: 7.3 g/dL (ref 6.0–8.3)

## 2024-02-08 LAB — CBC
HCT: 30.7 % — ABNORMAL LOW (ref 39.0–52.0)
Hemoglobin: 9.8 g/dL — ABNORMAL LOW (ref 13.0–17.0)
MCHC: 32 g/dL (ref 30.0–36.0)
MCV: 83.3 fl (ref 78.0–100.0)
Platelets: 373 K/uL (ref 150.0–400.0)
RBC: 3.68 Mil/uL — ABNORMAL LOW (ref 4.22–5.81)
RDW: 18 % — ABNORMAL HIGH (ref 11.5–15.5)
WBC: 4.6 K/uL (ref 4.0–10.5)

## 2024-02-08 MED ORDER — COMIRNATY 30 MCG/0.3ML IM SUSY
0.3000 mL | PREFILLED_SYRINGE | Freq: Once | INTRAMUSCULAR | 0 refills | Status: AC
  Filled 2024-02-08: qty 0.3, 1d supply, fill #0

## 2024-02-08 NOTE — Progress Notes (Signed)
 Chief Complaint  Patient presents with   Annual Exam    CPE    Well Male Reginald Rios is here for a complete physical.   His last physical was >1 year ago.  Current diet: in general, a healthy diet.   Current exercise: walking Weight trend: stable Fatigue out of ordinary? No. Seat belt? Yes.   Advanced directive? 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 No issues now   Hemarthrosis of right elbow 02/03/2016   Hoarseness 04/13/2019   Hyperlipidemia    family hx of high cholesterol   Hypertension    Hypogonadism in male    Incomplete rotator cuff tear 09/10/2015   Injected 09/10/2015   Lateral epicondylitis of right elbow 07/28/2019   Low back pain at multiple sites 02/05/2021   Low vitamin D  level 04/22/2017   Mild persistent asthma/cough variant asthma 04/13/2019   Myofascial pain syndrome,  cervical 07/28/2019   Neck pain 06/01/2018   Nonischemic cardiomyopathy (HCC) 05/15/2015   Nontraumatic incomplete tear of right rotator cuff 07/25/2019   Occipital headache 06/01/2018   Onychomycosis 05/25/2018   Osteoarthritis    Pain in right ankle and joints of right foot 10/05/2016   Palpitations 07/06/2017   Pernicious anemia    PONV (postoperative nausea and vomiting)    Preop cardiovascular exam 10/18/2023   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   Shoulder bursitis 10/12/2014   Injected in 10/12/2014  Repeat 01/21/2015   Small thenar eminence 02/03/2016   Spinal stenosis, lumbar region, without neurogenic claudication 10/12/2014   Spondylolisthesis at L5-S1 level 05/09/2021   Strain of latissimus dorsi muscle 01/21/2015   Tendinopathy of right biceps tendon 07/25/2019   Type 2 diabetes mellitus with hyperglycemia, without long-term current use of insulin  (HCC) 12/22/2013   Ulnar neuropathy at elbow of right upper extremity 01/03/2019     Past Surgical History:  Procedure Laterality Date   CATARACT EXTRACTION W/ INTRAOCULAR LENS IMPLANT Bilateral 2023   COLONOSCOPY     CORONARY PRESSURE/FFR STUDY N/A 06/23/2017   Procedure: INTRAVASCULAR PRESSURE WIRE/FFR STUDY;  Surgeon: Anner Alm ORN, MD;  Location: [facility] INVASIVE CV LAB;  Service: Cardiovascular;  Laterality: N/A;   CORONARY STENT INTERVENTION N/A 06/23/2017   Procedure: CORONARY STENT INTERVENTION;  Surgeon: Anner Alm ORN, MD;  Location: [facility] INVASIVE CV LAB;  Service: Cardiovascular;  Laterality: N/A;   KNEE ARTHROSCOPY Left    LEFT HEART CATH AND CORONARY ANGIOGRAPHY N/A 06/23/2017  Procedure: LEFT HEART CATH AND CORONARY ANGIOGRAPHY;  Surgeon: Anner Alm ORN, MD;  Location: [facility] INVASIVE CV LAB;  Service: Cardiovascular;  Laterality: N/A;   LEFT HEART CATH AND CORONARY ANGIOGRAPHY N/A 07/14/2023   Procedure: LEFT HEART CATH AND CORONARY ANGIOGRAPHY;  Surgeon: Elmira Newman PARAS, MD;  Location: [facility] INVASIVE CV LAB;   Service: Cardiovascular;  Laterality: N/A;   SHOULDER ARTHROSCOPY WITH BICEPSTENOTOMY Right 09/06/2019   Procedure: SHOULDER ARTHROSCOPY WITH BICEPSTENOTOMY;  Surgeon: Jerri Kay HERO, MD;  Location: [facility];  Service: Orthopedics;  Laterality: Right;   SHOULDER ARTHROSCOPY WITH SUBACROMIAL DECOMPRESSION Right 09/06/2019   Procedure: RIGHT SHOULDER ARTHROSCOPY WITH EXTENSIVE DEBRIDEMENT, SUBACROMIAL DECOMPRESSION, BICEPS TENOTOMY;  Surgeon: Jerri Kay HERO, MD;  Location: [facility];  Service: Orthopedics;  Laterality: Right;   TENOTOMY ACHILLES TENDON  2007   In [place], [place]   ULNAR NERVE TRANSPOSITION  2006   In [place], [place]    Medications  Current Outpatient Medications on File Prior to Visit  Medication Sig Dispense Refill   acetaminophen  (TYLENOL ) 500 MG tablet Take 1,000 mg by mouth every 6 (six) hours as needed for moderate pain (pain score 4-6).     albuterol  (VENTOLIN  HFA) 108 (90 Base) MCG/ACT inhaler Inhale 2 puffs into the lungs every 4 (four) to 6 (six) hours as needed for coughing or wheezing spells. 6.7 g 1   amLODipine  (NORVASC ) 2.5 MG tablet Take 1 tablet (2.5 mg total) by mouth daily. 90 tablet 3   azelastine  (ASTELIN ) 0.1 % nasal spray Place 2 sprays into both nostrils 2 (two) times daily as needed for rhinitis (nasal congestion). (Patient taking differently: Place 2 sprays into both nostrils at bedtime.) 30 mL 3   Blood Glucose Monitoring Suppl (FREESTYLE FREEDOM LITE) w/Device KIT Use to check blood sugar daily. 1 kit 0   clopidogrel  (PLAVIX ) 75 MG tablet Take 1 tablet (75 mg total) by mouth daily. 90 tablet 2   diclofenac  Sodium (VOLTAREN ) 1 % GEL Apply 2 g topically 4 (four) times daily. (Patient taking differently: Apply 2 g topically 4 (four) times daily as needed (pain).) 100 g 0   empagliflozin  (JARDIANCE ) 25 MG TABS tablet Take 1 tablet (25 mg total) by mouth daily before breakfast. 90 tablet 1   ezetimibe  (ZETIA ) 10 MG tablet Take 1  tablet (10 mg total) by mouth daily. 90 tablet 3   fluticasone  (FLONASE ) 50 MCG/ACT nasal spray Place 2 sprays into both nostrils daily. 16 g 6   Glucagon  (GVOKE HYPOPEN  1-PACK) 1 MG/0.2ML SOAJ Inject into the skin in the event of low sugar. 0.2 mL 2   glucose blood (FREESTYLE LITE) test strip Use daily to check blood sugar. 100 each 3   glucose blood (FREESTYLE LITE) test strip Use daily to check blood sugar. 100 each 3   glyBURIDE  (DIABETA ) 2.5 MG tablet Take 1 tablet (2.5 mg total) by mouth daily with breakfast. (Patient taking differently: Take 2.5 mg by mouth daily as needed (elevated blood sugar).) 30 tablet 2   ketoconazole  (NIZORAL ) 2 % shampoo Apply 1 Application topically 2 (two) times a week. Leave on for 5 min before rinsing. 120 mL 0   Lancets (FREESTYLE) lancets Use daily to check blood sugar 100 each 3   levocetirizine (XYZAL ) 5 MG tablet Take 1 tablet (5 mg total) by mouth every evening. 30 tablet 2   losartan  (COZAAR ) 25 MG tablet Take 1 tablet (25 mg total) by mouth daily. 90 tablet 2   meclizine  (ANTIVERT )  25 MG tablet Take 1 tablet (25 mg total) by mouth 3 (three) times daily as needed for dizziness. 30 tablet 0   metFORMIN  (GLUCOPHAGE -XR) 500 MG 24 hr tablet Take 2 tablets (1,000 mg total) by mouth 2 (two) times daily with a meal. 120 tablet 5   methocarbamol  (ROBAXIN ) 500 MG tablet Take 1 tablet (500 mg total) by mouth every 6 (six) hours as needed for muscle spasms. 30 tablet 3   methylphenidate  (RITALIN ) 20 MG tablet Take 1 tablet (20 mg total) by mouth 2 (two) times daily. 60 tablet 0   methylphenidate  (RITALIN ) 20 MG tablet Take 1 tablet (20 mg total) by mouth 2 (two) times daily. 60 tablet 0   methylphenidate  (RITALIN ) 20 MG tablet Take 1 tablet (20 mg total) by mouth 2 (two) times daily. 60 tablet 0   montelukast  (SINGULAIR ) 10 MG tablet Take 1 tablet (10 mg total) by mouth daily. 90 tablet 3   Multiple Vitamin (MULTIVITAMIN WITH MINERALS) TABS tablet Take 1 tablet by  mouth in the morning. One A Day     nitroGLYCERIN  (NITROSTAT ) 0.4 MG SL tablet Place 0.4 mg under the tongue every 5 (five) minutes x 3 doses as needed for chest pain.     oxyCODONE -acetaminophen  (PERCOCET/ROXICET) 5-325 MG tablet Take 1-2 tablets by mouth every 6 (six) hours as needed for severe pain (pain score 7-10) or moderate pain (pain score 4-6). 50 tablet 0   pantoprazole  (PROTONIX ) 40 MG tablet Take 1 tablet (40 mg total) by mouth daily. 90 tablet 0   pioglitazone  (ACTOS ) 45 MG tablet Take 1 tablet (45 mg total) by mouth daily. 90 tablet 1   Probiotic Product (PROBIOTIC PO) Take 2 capsules by mouth in the morning.     rosuvastatin  (CRESTOR ) 5 MG tablet Take 1 tablet (5 mg total) by mouth daily. 90 tablet 3   sildenafil  (VIAGRA ) 100 MG tablet Take 1 tablet (100 mg total) by mouth daily as needed. Take 1 hour prior to sexual activity 20 tablet 3   testosterone  (ANDROGEL ) 50 MG/5GM (1%) GEL Apply 2 applications onto the skin daily. 300 g 5   Vitamin D , Ergocalciferol , (DRISDOL ) 1.25 MG (50000 UNIT) CAPS capsule Take 1 capsule (50,000 Units total) by mouth every 7 (seven) days. 12 capsule 1   Allergies Allergies  Allergen Reactions   Invokana  [Canagliflozin ] Rash   Grass Pollen(K-O-R-T-Swt Vern)    Lisinopril  Cough   Semaglutide  Nausea Only    Causes cramping. Patient is intolerant to Rybelsus     Family History Family History  Problem Relation Age of Onset   Lung cancer Mother 64   Cancer Mother        lung cancer   Coronary artery disease Father 73   Heart disease Father    Hypothyroidism Brother    Food Allergy Son    Asthma Paternal Aunt    Colon cancer Neg Hx    Prostate cancer Neg Hx    Allergic rhinitis Neg Hx    Angioedema Neg Hx    Eczema Neg Hx    Immunodeficiency Neg Hx    Urticaria Neg Hx     Review of Systems: Constitutional:  no fevers Eye:  no recent significant change in vision Ears:  No changes in hearing Nose/Mouth/Throat:  no complaints of nasal  congestion, no sore throat Cardiovascular: no chest pain Respiratory:  No shortness of breath Gastrointestinal:  No change in bowel habits GU:  No frequency Integumentary:  no abnormal skin lesions reported Neurologic:  no headaches Endocrine:  denies unexplained weight changes  Exam BP 120/70 (BP Location: Left Arm, Patient Position: Sitting)   Pulse 98   Temp 98 F (36.7 C) (Oral)   Resp 16   Ht 5' 10 (1.778 m)   Wt 178 lb 14.4 oz (81.1 kg)   SpO2 99%   BMI 25.67 kg/m  General:  well developed, well nourished, in no apparent distress Skin:  no significant moles, warts, or growths Head:  no masses, lesions, or tenderness Eyes:  pupils equal and round, sclera anicteric without injection Ears:  canals without lesions, TMs shiny without retraction, no obvious effusion, no erythema Nose:  nares patent, mucosa normal Throat/Pharynx:  lips and gingiva without lesion; tongue and uvula midline; non-inflamed pharynx; no exudates or postnasal drainage Lungs:  clear to auscultation, breath sounds equal bilaterally, no respiratory distress Cardio:  regular rate and rhythm, no LE edema or bruits Rectal: Deferred GI: BS+, S, NT, ND, no masses or organomegaly Musculoskeletal:  symmetrical muscle groups noted without atrophy or deformity Neuro:  gait normal; deep tendon reflexes normal and symmetric Psych: well oriented with normal range of affect and appropriate judgment/insight  Assessment and Plan  Well adult exam - Plan: Lipid panel, Comprehensive metabolic panel with GFR, CBC   Well 68 y.o. male. Counseled on diet and exercise. Advanced directive form provided today.  Flu shot today.  Shingrix rec'd. Covid Rec'd. To get at pharmacy.  Other orders as above. Follow up in 6 mo.  The patient voiced understanding and agreement to the plan.  Mabel [place], DO 02/08/24 9:48 AM

## 2024-02-08 NOTE — Patient Instructions (Addendum)
 Give [place] 2-3 business days to get the results of your labs back.   Keep the diet clean and stay active.  Please get me a copy of your advanced directive form at your convenience.   The Shingrix vaccine (for shingles) is a 2 shot series spaced 2-6 months apart. It can make people feel low energy, achy and almost like they have the flu for 48 hours after injection. 1/5 people can have nausea and/or vomiting. Please plan accordingly when deciding on when to get this shot. Call our office for a nurse visit appointment to get this. The second shot of the series is less severe regarding the side effects, but it still lasts 48 hours.   Let us know if you need anything.

## 2024-02-08 NOTE — Addendum Note (Signed)
 Addended by: Tyquisha Sharps M on: 02/08/2024 09:56 AM   Modules accepted: Orders

## 2024-02-11 ENCOUNTER — Ambulatory Visit: Admitting: Family Medicine

## 2024-02-11 DIAGNOSIS — E538 Deficiency of other specified B group vitamins: Secondary | ICD-10-CM | POA: Diagnosis not present

## 2024-02-11 MED ORDER — CYANOCOBALAMIN 1000 MCG/ML IJ SOLN
1000.0000 ug | Freq: Once | INTRAMUSCULAR | Status: AC
  Administered 2024-02-11: 1000 ug via INTRAMUSCULAR

## 2024-02-11 NOTE — Progress Notes (Signed)
 Pt here for monthly B12 injection per original order dated: Monthly B-12  Last B12 injection:01/13/2024  B12 1000mcg given IM, and pt tolerated injection well.  Next B12 injection scheduled for:  Sent Provider message about rechecking B-12 levels. Will call Pt for appt to recheck labs.

## 2024-02-12 ENCOUNTER — Encounter: Payer: Self-pay | Admitting: Family Medicine

## 2024-02-14 ENCOUNTER — Other Ambulatory Visit: Payer: Self-pay

## 2024-02-14 DIAGNOSIS — D649 Anemia, unspecified: Secondary | ICD-10-CM

## 2024-02-16 NOTE — Therapy (Deleted)
 OUTPATIENT PHYSICAL THERAPY THORACOLUMBAR and VESTIBULAR TREATMENT AND ON HOLD NOTE/DISCHARGE   Patient Name: Reginald Rios MRN: [id number] DOB:[birth year], 68 y.o., male Today's Date: 02/16/2024   PHYSICAL THERAPY DISCHARGE SUMMARY  Visits from Start of Care: 22  Current functional level related to goals / functional outcomes: See goal status below for last known status.   Remaining deficits: Patient did not return to PT-- was consulting with neurosurgery.   Education / Equipment: HEP   Patient agrees to discharge. Patient goals were partially met. Patient is being discharged due to not returning since the last visit.  END OF SESSION:      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   BPPV (benign paroxysmal positional vertigo) 08/14/2014   CAD (coronary artery disease)    2/19 PCI/DESx1 to Lcx   Cervical disc disorder with radiculopathy of cervical region 10/07/2015   Cervical radicular pain 01/10/2019   Chronic cough 04/13/2019   Decreased cardiac ejection fraction 04/02/2015   Diabetes mellitus without complication (HCC)    Dyspnea on exertion 05/15/2015   Erectile dysfunction 07/11/2019   Exercise-induced asthma    Family history of early CAD 07/05/2017   Fatigue 05/15/2015   Hallux rigidus, right foot 09/15/2021   Heart murmur    In early childhood. No issues now   Hemarthrosis of right elbow 02/03/2016   Hoarseness 04/13/2019   Hyperlipidemia    family hx of high cholesterol   Hypertension    Hypogonadism in male    Incomplete rotator cuff tear 09/10/2015    Injected 09/10/2015   Lateral epicondylitis of right elbow 07/28/2019   Low back pain at multiple sites 02/05/2021   Low vitamin D  level 04/22/2017   Mild persistent asthma/cough variant asthma 04/13/2019   Myofascial pain syndrome, cervical 07/28/2019   Neck pain 06/01/2018   Nonischemic cardiomyopathy (HCC) 05/15/2015   Nontraumatic incomplete tear of right rotator cuff 07/25/2019   Occipital headache 06/01/2018   Onychomycosis 05/25/2018   Osteoarthritis    Pain in right ankle and joints of right foot 10/05/2016   Palpitations 07/06/2017   Pernicious anemia    PONV (postoperative nausea and vomiting)    Preop cardiovascular exam 10/18/2023   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   Shoulder bursitis 10/12/2014   Injected in 10/12/2014  Repeat 01/21/2015   Small thenar eminence 02/03/2016   Spinal stenosis, lumbar region, without neurogenic claudication 10/12/2014   Spondylolisthesis at L5-S1 level 05/09/2021   Strain of latissimus dorsi muscle 01/21/2015   Tendinopathy of right biceps tendon 07/25/2019   Type 2 diabetes mellitus with hyperglycemia, without long-term current use of insulin  (HCC) 12/22/2013   Ulnar neuropathy at elbow of right upper extremity 01/03/2019   Past Surgical History:  Procedure Laterality Date   CATARACT EXTRACTION W/ INTRAOCULAR LENS IMPLANT Bilateral 2023   COLONOSCOPY     CORONARY PRESSURE/FFR STUDY N/A 06/23/2017   Procedure: INTRAVASCULAR PRESSURE WIRE/FFR STUDY;  Surgeon: Anner Alm ORN, MD;  Location: [facility];  Service: Cardiovascular;  Laterality: N/A;   CORONARY STENT INTERVENTION N/A 06/23/2017   Procedure: CORONARY STENT INTERVENTION;  Surgeon: Anner Alm ORN, MD;  Location:  [facility];  Service: Cardiovascular;  Laterality: N/A;   KNEE ARTHROSCOPY Left    LEFT HEART CATH AND CORONARY ANGIOGRAPHY N/A 06/23/2017   Procedure: LEFT HEART CATH AND CORONARY ANGIOGRAPHY;  Surgeon: Anner Alm ORN, MD;  Location: [facility];   Service: Cardiovascular;  Laterality: N/A;   LEFT HEART CATH AND CORONARY ANGIOGRAPHY N/A 07/14/2023   Procedure: LEFT HEART CATH AND CORONARY ANGIOGRAPHY;  Surgeon: Elmira Newman PARAS, MD;  Location: [facility];  Service: Cardiovascular;  Laterality: N/A;   SHOULDER ARTHROSCOPY WITH BICEPSTENOTOMY Right 09/06/2019   Procedure: SHOULDER ARTHROSCOPY WITH BICEPSTENOTOMY;  Surgeon: Jerri Kay HERO, MD;  Location: [facility];  Service: Orthopedics;  Laterality: Right;   SHOULDER ARTHROSCOPY WITH SUBACROMIAL DECOMPRESSION Right 09/06/2019   Procedure: RIGHT SHOULDER ARTHROSCOPY WITH EXTENSIVE DEBRIDEMENT, SUBACROMIAL DECOMPRESSION, BICEPS TENOTOMY;  Surgeon: Jerri Kay HERO, MD;  Location: [facility];  Service: Orthopedics;  Laterality: Right;   TENOTOMY ACHILLES TENDON  2007   In [place], [place]   ULNAR NERVE TRANSPOSITION  2006   In [place], [place]   Patient Active Problem List   Diagnosis Date Noted   Hypertension    PONV (postoperative nausea and vomiting)    Preop cardiovascular exam 10/18/2023   Pernicious anemia    Hallux rigidus, right foot 09/15/2021   Spondylolisthesis at L5-S1 level 05/09/2021   Low back pain at multiple sites 02/05/2021   Osteoarthritis    Heart murmur    Exercise-induced asthma    Diabetes mellitus without complication (HCC)    Arthritis    Anemia    Myofascial pain syndrome, cervical 07/28/2019   Lateral epicondylitis of right elbow 07/28/2019   Nontraumatic incomplete tear of right rotator cuff 07/25/2019   Tendinopathy of right biceps tendon 07/25/2019   Erectile dysfunction 07/11/2019   Mild persistent asthma/cough variant asthma 04/13/2019   Allergic rhinitis with a possible nonallergic component 04/13/2019   Acid reflux 04/13/2019   Chronic cough 04/13/2019   Hoarseness 04/13/2019   Cervical radicular pain 01/10/2019   Ulnar neuropathy at elbow of right upper extremity 01/03/2019   Acute bilateral low back pain  without sciatica 01/03/2019   Neck pain 06/01/2018   Occipital headache 06/01/2018   Onychomycosis 05/25/2018   Palpitations 07/06/2017   CAD (coronary artery disease) 07/05/2017   Family history of early CAD 07/05/2017   Abnormal findings on diagnostic imaging of cardiovascular system 06/23/2017   Low vitamin D  level 04/22/2017   Pain in right ankle and joints of right foot 10/05/2016   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   Hemarthrosis of right elbow 02/03/2016   Small thenar eminence 02/03/2016   Cervical disc disorder with radiculopathy of cervical region 10/07/2015   Incomplete rotator cuff tear 09/10/2015   Nonischemic cardiomyopathy (HCC) 05/15/2015   Fatigue 05/15/2015   Dyspnea on exertion 05/15/2015   Decreased cardiac ejection fraction 04/02/2015   Strain of latissimus dorsi muscle 01/21/2015   Shoulder bursitis 10/12/2014   Spinal stenosis, lumbar region, without neurogenic claudication 10/12/2014   BPPV (benign paroxysmal positional vertigo) 08/14/2014   Hyperlipidemia 01/19/2014   Anemia, unspecified 01/19/2014   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   Type 2 diabetes mellitus with hyperglycemia, without long-term current use of insulin  (HCC) 12/22/2013   Hypogonadism in male 12/22/2013   Arthritis of hand, left 12/22/2013    PCP: Dr Mabel Pry, DO REFERRING PROVIDER: Dr Artist GORMAN Lloyd   REFERRING DIAG:  Spondylolisthesis at L5-S1 level  Chronic bilateral low  back pain with right-sided sciatica    Rationale for Evaluation and Treatment: Rehabilitation  THERAPY DIAG:  No diagnosis found.  ONSET DATE: 12/12/22  SUBJECTIVE:                                                                                                                                                                                       SUBJECTIVE STATEMENT: The patient reports he is getting pain in the anterior aspect of both legs in distal femur to anterior shins x  the past week.. On the right side, it can go into the top of the foot. I couldn't even drive my wife's car because he had R leg pain. The low back is feeling better-- mornings are tough due to stiffness, but back is currently not stopping him during the day. He notices more frequency in urination but not sure if it's because his legs are hurting and waking him up more. He also feels like getting up and down is getting tougher. The patient reports dizziness is improved at this time.  PERTINENT HISTORY:  Chronic LBP; bilat hamstring tightness; bilat knees (L meniscus tear and R achilles tear); cervical disc surgery; AODM,   PAIN:  Are you having pain? Yes: NPRS scale: 3/10 in back, leg pain is 7/10 (this is new pain) Pain location: LB; R hip Pain description: dull aching in the LB; not as sharp more diffuse Aggravating factors: back - sitting > 10 min; leg - turning foot/twisting  Relieving factors: meds   PRECAUTIONS: None   WEIGHT BEARING RESTRICTIONS: No  FALLS:  Has patient fallen in last 6 months? No  PATIENT GOALS: stop most of the pain; get back to activities   OBJECTIVE:  DIAGNOSTIC FINDINGS:  IMPRESSION: 1. No acute fracture or traumatic listhesis. 2. Unchanged multilevel degenerative changes of the lumbar spine with severe spinal canal narrowing at L4-L5 and moderate to severe spinal canal narrowing at L2-L3 and L5-S1. 3. Unchanged multilevel neural foraminal narrowing, severe on the left at L4-L5 and on the right at L5-S1.  PATIENT SURVEYS:  FOTO 31; goals 48    MUSCLE LENGTH: Hamstrings: Right 70 deg; Left 65 deg Tight hip flexors bilat   POSTURE: rounded shoulders, forward head, decreased lumbar lordosis, increased thoracic kyphosis, flexed trunk , and weight shift right  PALPATION: Pain with PA mobs lumbar spine; R lumbar paraspinals; posterior hip in gluts and piriformis; hamstrings   LUMBAR ROM:  AROM eval  Flexion 70% pain bilat LB  Extension 10% pain R  mid back   Right lateral flexion 65% pull L > R LB  Left lateral flexion 60% pull R LB  Right rotation  20% pain mid back  Left rotation 20% discomfort mid back    (Blank rows = not tested)  LOWER EXTREMITY ROM:    Active Assistive Right eval Left eval  Hip flexion    Hip extension 0 0  Hip abduction    Hip adduction    Hip internal rotation    Hip external rotation    Knee flexion 90 95  Knee extension -5 -5  Ankle dorsiflexion 0 0  Ankle plantarflexion    Ankle inversion    Ankle eversion     (Blank rows = not tested)  LOWER EXTREMITY MMT:   MMT Right eval Right  02/22/23 Left eval Left  02/22/23 Right 03/24/23  Hip flexion 5- 5 5 5    Hip extension 4- 4+ 5- 5-   Hip abduction 3+ 4 4+ 5 4/5  Hip adduction       Hip internal rotation       Hip external rotation       Knee flexion 5  5    Knee extension 5  5    Ankle dorsiflexion       Ankle plantarflexion       Ankle inversion       Ankle eversion        (Blank rows = not tested)  LUMBAR SPECIAL TESTS:  Straight leg raise test: Negative and Slump test: Negative  FUNCTIONAL TESTS:  SLS: R 2 sec ; L 1 sec  5 times sit to stand: 23.5 sec  GAIT: Distance walked: 40  Assistive device utilized: None Level of assistance: Complete Independence Comments: flexed forward at trunk and hips   VESTIBULAR ASSESSMENT: 03/24/23  GENERAL OBSERVATION: Walks without device today   SYMPTOM BEHAVIOR:  Subjective history: The patient reports a h/o intermittent positional vertigo x 2-3 years. He takes meclizine  as needed.  Non-Vestibular symptoms: n/a  Type of dizziness: Spinning/Vertigo  Frequency: intermittent x seconds  Duration: seconds  Aggravating factors: prone position to L sidelying typically provokes  Relieving factors: moving out of the position  Progression of symptoms: unchanged  OCULOMOTOR EXAM:  Ocular Alignment: normal  Ocular ROM: No Limitations  Spontaneous Nystagmus: TBA  Gaze-Induced Nystagmus:  TBA  Smooth Pursuits: TBA  Saccades: TBA   VESTIBULAR - OCULAR REFLEX:   Slow VOR: Comment: TBA  VOR Cancellation: Comment: TBA    POSITIONAL TESTING: Right Dix-Hallpike: SENSATION of dizziness could come on, no nystagmus viewed in room light Left Dix-Hallpike: no nystagmus Right Roll Test: no nystagmus Left Roll Test: no nystagmus Right Sidelying: no nystagmus Left Sidelying: no nystagmus   [facility] Adult PT Treatment:                                                DATE: 04/07/23 Therapeutic Exercise: Reviewed HEP and consolidated for continuation while on hold Supine Single knee to chest Double knee to chest Bridges HS stretches Dead bug *see HEP for sets/reps Seated Sit<>stand Prone Gentle press ups Quad stretch Self Care: Discussed change in pain presentation with sensation of worsening weakness. PT and patient discussed last visit with MD and his clinical presentation has changed since then and referral was made at that time to neurosurgery. He has appt in January. Recommend hold until that assessment completed.    [facility] Adult PT Treatment:  DATE: 03/24/23 Therapeutic Exercise: Prone Lumbar extension x 10 reps Hip rotation PROM with knee flexed Press up with one LE off edge of mat for extension of lumbar spine + hip flexor stretch quadriped Wag the tail Cat/cow with overpressure x 5 reps Lateral rotation for thoracic spine opening Supine Hip adductor stretch bent knee fallout Standing Heel raises x 15 reps Manual Therapy: Mobilization with movement PA mobs with press ups and during quadriped activities PA mobs thoracolumbar junction and lateral mobs L1-L2 Neuromuscular re-ed: Single leg standing x 3 seconds R and L sides   [facility] Adult PT Treatment:                                                DATE: 03/22/2023 Therapeutic Exercise: Counter: L stretch HS stretch Thoracic rotation (thred the needle) Doorway spinal  stretch --> sliding dowel up frame Paloff press blue TB --> horiz paloff press blue TB Cat/cow --> added RTB to isolate thoracic mobility (discontinued d/t pain) Wag the tail Prone:  Propped up on elbows --> added knee flexion Press up (lumbar extension) x10 Head down --> knee flexion x10 (B) Quadruped: Child's pose --> flat back rock back  Small range hip extension (knee float back) Stabilizing knee propped on yoga block --> opp hip rotation Figure LTR Prone press up Modalities: Supine on ice pack x 10 min   [facility] Adult PT Treatment:                                                DATE: 03/17/23 Therapeutic Exercise: Prone Lumbar extension x 10 reps, limited ROM Significant improvement with ROM after mobiization with movement Quadriped Cat/cow x 10 reps with tactile cues Knee rotation with one leg on yoga block x 10 reps Standing Squats x 10 reps adding 10# kettle bell Lateral lunges with UE support with overpressure into adductor stretch Supine Towel roll stretch reaching overhead with towel perpendicular to thoracolumbar junction and L5 region Manual Therapy: Mobilization with movement PA mobs with press ups and during quadriped activities PA mobs thoracolumbar junction and lateral mobs L1-L2   PATIENT EDUCATION:  (none further 03/17/23) Education details: HEP  Person educated: Patient Education method: Programmer, multimedia, Facilities manager, Actor cues, Verbal cues, and Handouts Education comprehension: verbalized understanding, returned demonstration, verbal cues required, tactile cues required, and needs further education  HOME EXERCISE PROGRAM: Access Code: [email or web address] URL: [email or web address] Date: 04/07/2023 Prepared by: Tawni Ferrier  Exercises - Dead Bug  - 1 x daily - 7 x weekly - 3 sets - 10 reps - Supine Hamstring Stretch  - 1 x daily - 7 x weekly - 1 sets - 2 reps - 20 seconds hold - Supine Single Knee to Chest Stretch  - 1 x daily - 7 x weekly -  1 sets - 10 reps - 10-20 sec hold - Supine Double Knee to Chest  - 1 x daily - 7 x weekly - 1 sets - 10 reps - Supine Bridge  - 1 x daily - 7 x weekly - 1-3 sets - 10 reps - Prone Press Up  - 3 x daily - 7 x weekly - 2 sets - 10 reps - Modified Prone Quadriceps Stretch with Strap  - 1  x daily - 7 x weekly - 3 sets - 10 reps - Sit to Stand with Arm Swing  - 1 x daily - 7 x weekly - 1 sets - 10 reps  ASSESSMENT:  CLINICAL IMPRESSION: Pain is improved with movement at each session. He feels improvement with flexion double knee to chest and gentle extension in prone. PT consolidated HEP to 2 groups and recommended he continue these to tolerance while awaiting appointment with neurosurgery. Patient has consult for January. He has a change in characteristics of pain with greater leg intensity and tingling sensation. PT recommended he continue HEP and f/u with neurosurgery as scheduled. Pain improved from 7/10 at eval to 2-4/10 (2 on right and 4 on left). Pain is worse in cars with prolonged seating and he has had a reduced ability to drive his wife's car. He notes R LE pain and generalized sensation of worsening weakness.    OBJECTIVE IMPAIRMENTS: Abnormal gait, decreased activity tolerance, decreased endurance, decreased mobility, decreased ROM, hypomobility, increased fascial restrictions, increased muscle spasms, impaired flexibility, improper body mechanics, postural dysfunction, and pain.   GOALS: Goals reviewed with patient? Yes  LONG TERM GOALS: Target date: 04/23/23  The patient will be indep with HEP progression. Baseline:  to modify for post d/c HEP Goal status: REVISED  2.  Trunk ROM/mobility WFL's and minimal to no pain  Goal status: REVISED  3.  Increase strength R LE to 4+/5 to 5/5  Goal status:  REVISED  4.  Patient reports return to regular activities including regular exercise - yoga and water exercises Goal status: REVISED  5.  Improve functional limitation score to  48 Goal status: REVISED  6. Patient will have no dizziness with positional testing.  Baseline: some subjective sensation of vertigo with R dix hallpike, none with R sidelying test   Goal Status: NEW  7. Patient will have single leg stance x 5 seconds R and L sides  Baseline: 2-3 seconds  Goal Status: NEW PLAN:  PT FREQUENCY: 2x/week  PT DURATION: 4 more weeks  PLANNED INTERVENTIONS: Therapeutic exercises, Therapeutic activity, Neuromuscular re-education, Balance training, Gait training, Patient/Family education, Self Care, Joint mobilization, Aquatic Therapy, Dry Needling, Electrical stimulation, Spinal mobilization, Cryotherapy, Moist heat, Taping, Traction, Ultrasound, Ionotophoresis 4mg /ml Dexamethasone , Manual therapy, and Re-evaluation.  PLAN FOR NEXT SESSION: on hold, awaiting further workup with neurosurgery  Rojean JONELLE Batten, PT 02/16/2024 11:18 AM

## 2024-02-21 ENCOUNTER — Encounter (HOSPITAL_BASED_OUTPATIENT_CLINIC_OR_DEPARTMENT_OTHER): Payer: Self-pay | Admitting: Physical Therapy

## 2024-02-21 ENCOUNTER — Ambulatory Visit (HOSPITAL_BASED_OUTPATIENT_CLINIC_OR_DEPARTMENT_OTHER): Attending: Neurological Surgery | Admitting: Physical Therapy

## 2024-02-21 ENCOUNTER — Other Ambulatory Visit: Payer: Self-pay

## 2024-02-21 DIAGNOSIS — G8929 Other chronic pain: Secondary | ICD-10-CM | POA: Insufficient documentation

## 2024-02-21 DIAGNOSIS — M5441 Lumbago with sciatica, right side: Secondary | ICD-10-CM | POA: Diagnosis not present

## 2024-02-21 DIAGNOSIS — R29898 Other symptoms and signs involving the musculoskeletal system: Secondary | ICD-10-CM | POA: Diagnosis not present

## 2024-02-21 DIAGNOSIS — M6281 Muscle weakness (generalized): Secondary | ICD-10-CM | POA: Insufficient documentation

## 2024-02-21 NOTE — Therapy (Unsigned)
 OUTPATIENT PHYSICAL THERAPY THORACOLUMBAR EVALUATION   Patient Name: Reginald Rios MRN: [id number] DOB:07/29/1955, 68 y.o., male Today's Date: 02/22/2024  END OF SESSION:     PT End of Session - 02/22/24 0704     Visit Number 1    Number of Visits 8    Date for Recertification  04/10/24    Authorization Type [employer]    PT Start Time 0130    PT Stop Time 0210    PT Time Calculation (min) 40 min    Activity Tolerance Patient tolerated treatment well    Behavior During Therapy [facility] for tasks assessed/performed          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   BPPV (benign paroxysmal positional vertigo) 08/14/2014   CAD (coronary artery disease)    2/19 PCI/DESx1 to Lcx   Cervical disc disorder with radiculopathy of cervical region 10/07/2015   Cervical radicular pain 01/10/2019   Chronic cough 04/13/2019   Decreased cardiac ejection fraction 04/02/2015   Diabetes mellitus without complication (HCC)    Dyspnea on exertion 05/15/2015   Erectile dysfunction 07/11/2019   Exercise-induced asthma    Family history of early CAD 07/05/2017   Fatigue 05/15/2015   Hallux rigidus, right foot 09/15/2021   Heart murmur    In early childhood. No issues now   Hemarthrosis of right elbow 02/03/2016   Hoarseness 04/13/2019   Hyperlipidemia    family hx of high cholesterol   Hypertension    Hypogonadism in male    Incomplete rotator cuff tear 09/10/2015   Injected 09/10/2015   Lateral epicondylitis of right elbow 07/28/2019   Low back pain at multiple sites 02/05/2021   Low  vitamin D  level 04/22/2017   Mild persistent asthma/cough variant asthma 04/13/2019   Myofascial pain syndrome, cervical 07/28/2019   Neck pain 06/01/2018   Nonischemic cardiomyopathy (HCC) 05/15/2015   Nontraumatic incomplete tear of right rotator cuff 07/25/2019   Occipital headache 06/01/2018   Onychomycosis 05/25/2018   Osteoarthritis    Pain in right ankle and joints of right foot 10/05/2016   Palpitations 07/06/2017   Pernicious anemia    PONV (postoperative nausea and vomiting)    Preop cardiovascular exam 10/18/2023   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   Shoulder bursitis 10/12/2014   Injected in 10/12/2014  Repeat 01/21/2015   Small thenar eminence 02/03/2016   Spinal stenosis, lumbar region, without neurogenic claudication 10/12/2014   Spondylolisthesis at L5-S1 level 05/09/2021   Strain of latissimus dorsi muscle 01/21/2015   Tendinopathy of right biceps tendon 07/25/2019   Type 2 diabetes mellitus with hyperglycemia, without long-term current use of insulin  (HCC) 12/22/2013   Ulnar neuropathy at elbow of right upper extremity 01/03/2019   Past Surgical History:  Procedure Laterality Date   CATARACT EXTRACTION W/ INTRAOCULAR LENS IMPLANT Bilateral 2023   COLONOSCOPY     CORONARY PRESSURE/FFR STUDY N/A 06/23/2017   Procedure: INTRAVASCULAR PRESSURE WIRE/FFR STUDY;  Surgeon: Anner Alm ORN, MD;  Location: [facility] INVASIVE CV LAB;  Service: Cardiovascular;  Laterality: N/A;   CORONARY STENT INTERVENTION N/A 06/23/2017   Procedure: CORONARY STENT INTERVENTION;  Surgeon: Anner Alm ORN, MD;  Location: [facility] INVASIVE CV LAB;  Service: Cardiovascular;  Laterality: N/A;   KNEE ARTHROSCOPY Left    LEFT HEART CATH AND CORONARY ANGIOGRAPHY N/A 06/23/2017   Procedure: LEFT HEART CATH AND CORONARY ANGIOGRAPHY;  Surgeon: Anner Alm ORN, MD;  Location[facility] INVASIVE CV LAB;  Service: Cardiovascular;  Laterality: N/A;   LEFT HEART CATH AND CORONARY ANGIOGRAPHY N/A 07/14/2023   Procedure: LEFT  HEART CATH AND CORONARY ANGIOGRAPHY;  Surgeon: Elmira Newman PARAS, MD;  Location: [facility] INVASIVE CV LAB;  Service: Cardiovascular;  Laterality: N/A;   SHOULDER ARTHROSCOPY WITH BICEPSTENOTOMY Right 09/06/2019   Procedure: SHOULDER ARTHROSCOPY WITH BICEPSTENOTOMY;  Surgeon: Jerri Kay HERO, MD;  Location: [facility];  Service: Orthopedics;  Laterality: Right;   SHOULDER ARTHROSCOPY WITH SUBACROMIAL DECOMPRESSION Right 09/06/2019   Procedure: RIGHT SHOULDER ARTHROSCOPY WITH EXTENSIVE DEBRIDEMENT, SUBACROMIAL DECOMPRESSION, BICEPS TENOTOMY;  Surgeon: Jerri Kay HERO, MD;  Location: [facility];  Service: Orthopedics;  Laterality: Right;   TENOTOMY ACHILLES TENDON  2007   In [place], [place]   ULNAR NERVE TRANSPOSITION  2006   In [place], [place]   Patient Active Problem List   Diagnosis Date Noted   Hypertension    PONV (postoperative nausea and vomiting)    Preop cardiovascular exam 10/18/2023   Pernicious anemia    Hallux rigidus, right foot 09/15/2021   Spondylolisthesis at L5-S1 level 05/09/2021   Low back pain at multiple sites 02/05/2021   Osteoarthritis    Heart murmur    Exercise-induced asthma    Diabetes mellitus without complication (HCC)    Arthritis    Anemia    Myofascial pain syndrome, cervical 07/28/2019   Lateral epicondylitis of right elbow 07/28/2019   Nontraumatic incomplete tear of right rotator cuff 07/25/2019   Tendinopathy of right biceps tendon 07/25/2019   Erectile dysfunction 07/11/2019   Mild persistent asthma/cough variant asthma 04/13/2019   Allergic rhinitis with a possible nonallergic component 04/13/2019   Acid reflux 04/13/2019   Chronic cough 04/13/2019   Hoarseness 04/13/2019   Cervical radicular pain 01/10/2019   Ulnar neuropathy at elbow of right upper extremity 01/03/2019   Acute bilateral low back pain without sciatica 01/03/2019   Neck pain 06/01/2018   Occipital headache 06/01/2018   Onychomycosis 05/25/2018    Palpitations 07/06/2017   CAD (coronary artery disease) 07/05/2017   Family history of early CAD 07/05/2017   Abnormal findings on diagnostic imaging of cardiovascular system 06/23/2017   Low vitamin D  level 04/22/2017   Pain in right ankle and joints of right foot 10/05/2016   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   Hemarthrosis of right elbow 02/03/2016   Small thenar eminence 02/03/2016   Cervical disc disorder with radiculopathy of cervical region 10/07/2015   Incomplete rotator cuff tear 09/10/2015   Nonischemic cardiomyopathy (HCC) 05/15/2015   Fatigue 05/15/2015   Dyspnea on exertion 05/15/2015   Decreased cardiac ejection fraction 04/02/2015   Strain of latissimus dorsi muscle 01/21/2015   Shoulder bursitis 10/12/2014   Spinal stenosis, lumbar region, without neurogenic claudication 10/12/2014   BPPV (benign paroxysmal positional vertigo) 08/14/2014   Hyperlipidemia 01/19/2014   Anemia, unspecified 01/19/2014   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   Type 2 diabetes mellitus with hyperglycemia, without long-term current use of insulin  (HCC) 12/22/2013   Hypogonadism in male 12/22/2013   Arthritis of hand, left 12/22/2013    PCP: Frann Mabel Mt, DO  REFERRING PROVIDER: Colon Shove, MD  REFERRING DIAG: Spondylolisthesis, lumbosacral region [  M43.17]   Rationale for Evaluation and Treatment: Rehabilitation  THERAPY DIAG:  Muscle weakness (generalized)  Chronic bilateral low back pain with right-sided sciatica  ONSET DATE: 10/25/23 surgery date.  SUBJECTIVE:                                                                                                                                                                                           SUBJECTIVE STATEMENT: Pt was playing pickleball at [place] in January 2025l. He reports having pain following game and was unable to walk. Started having legs give way. Pain has been consistent prior/  after surgery, but no longer shooting pain. He also noted numbness in bilat LE's into feet. He states that numbness is new. My doctor says  I cannot do anymore damage at this point, He also said not to twist, I dont twist anymore as it flares everything up. I have tried walking on the beach and on uneven surfaces and it continues to cause a lot of pain and difficulty.   PERTINENT HISTORY:  Cardiac, Acid reflux, Anemia, Arthritis, ADD, BPPV, CAD, DM, Dyspnea on exertion, Osteoarthritis.   PAIN:  Are you having pain? Yes: NPRS scale: 9/10 Pain location: L side lower back.  Pain description: Achy pain, sharp pain Aggravating factors: twisting, walking on uneven surfaces.  Relieving factors: Resting.   PRECAUTIONS: None  RED FLAGS: None   WEIGHT BEARING RESTRICTIONS: No  FALLS:  Has patient fallen in last 6 months? No  LIVING ENVIRONMENT: Lives with: lives with their family Lives in: House/apartment Stairs: 24 stairs internally, 3 externally.  Has following equipment at home: None  OCCUPATION: Retired   PLOF: Independent  PATIENT GOALS: Pt would like to get back to walking.   NEXT MD VISIT: None currently.   OBJECTIVE:  Note: Objective measures were completed at Evaluation unless otherwise noted.  DIAGNOSTIC FINDINGS:  1. Status post L4-S1 discectomy and fusion without evidence of complication. The central canal and foramina appear widely patent at the surgical levels. 2. Mild to moderate central canal narrowing at L2-3 and L3-4. 3. Aortic atherosclerosis.   Aortic Atherosclerosis (ICD10-I70.0).  PATIENT SURVEYS:  Oswestry: 21/50  COGNITION: Overall cognitive status: Within functional limits for tasks assessed     SENSATION: WFL   POSTURE: rounded shoulders and forward head  PALPATION: Tenderness across lower back.   LUMBAR ROM:   AROM eval  Flexion 15%* hip flexion mostly.   Extension 10%   Right lateral flexion Pt denies movement due to pain   Left lateral flexion Pt denies movement due to pain  Right rotation Pt denies movement due to pain  Left  rotation Pt denies movement due to pain   (Blank rows = not tested)  LOWER EXTREMITY ROM:     Active  Right eval Left eval  Hip flexion WFL with reports of pain in lower back  [facility] with reports of pain in lower back   Hip extension    Hip abduction    Hip adduction    Hip internal rotation    Hip external rotation [facility]  Knee flexion [facility]  [facility]   Knee extension WFL with reports of pain in lower back [facility] with reports of pain in lower back   (Blank rows = not tested)  LOWER EXTREMITY MMT:    MMT Right eval Left eval  Hip flexion    Hip extension    Hip abduction    Hip adduction    Hip internal rotation    Hip external rotation 4 4  Knee flexion    Knee extension     (Blank rows = not tested)   FUNCTIONAL TESTS:  5 times sit to stand: 26.22 seconds.   GAIT: Distance walked: 39ft   Assistive device utilized: None Level of assistance: Complete Independence Comments: Pt walks with a slow antalgic gait. Minimal rotation.   TREATMENT DATE: 02/21/24                                                                                                                              - Education on self care management.  - Ball squeezes in supine - LTR to tolerance.    PATIENT EDUCATION:  Education details: Educated pt on anatomy and physiology of current symptoms, Oswestry, diagnosis, prognosis, HEP,  and POC. Person educated: Patient Education method: Explanation, Demonstration, and Handouts Education comprehension: verbalized understanding and returned demonstration  HOME EXERCISE PROGRAM: Access Code: [email or web address] URL: [email or web address] Date: 02/21/2024 Prepared by: Rojean Batten  Exercises - Supine Lower Trunk Rotation  - 1-2 x daily - 7 x weekly - 2-3 sets - 10 reps - Supine Hip Adduction Isometric with Ball  - 1-2 x daily - 7 x weekly - 2-3 sets -  10 reps   ASSESSMENT:  CLINICAL IMPRESSION: Patient referred to PT for chronic lower back pain. Pt has fear avoidance and does not perform lumbar flexion or rotation per subjective reports. He has been through extensive PT, but failed. Received surgery in June 2025 for posterior lumbar interbody fusion of L5 and S1. Patient will benefit from skilled PT to address below impairments, limitations and improve overall function.  OBJECTIVE IMPAIRMENTS: decreased activity tolerance, difficulty walking, decreased balance, decreased endurance, decreased mobility, decreased ROM, decreased strength, impaired flexibility, impaired UE/LE use, postural dysfunction, and pain.  ACTIVITY LIMITATIONS: bending, lifting, carry, locomotion, cleaning, community activity, driving, and or occupation  PERSONAL FACTORS: Cardiac, Acid reflux, Anemia, Arthritis, ADD, BPPV, CAD, DM, Dyspnea on exertion, Osteoarthritis.  are also affecting patient's functional outcome.  REHAB POTENTIAL: Good  CLINICAL DECISION MAKING: Evolving/moderate complexity  EVALUATION COMPLEXITY: Moderate  GOALS: Short term PT Goals Target date: 03/06/24 Pt will be I and compliant with HEP. Baseline:  Goal status: New Pt will decrease pain by 25% overall Baseline: Goal status: New  Long term PT goals Target date: 04/24/2024 Pt will improve ROM to [facility] to improve functional mobility Baseline: Goal status: New Pt will improve hip/knee strength to at least 5-/5 MMT to improve functional strength Baseline: Goal status: New Pt will improve OSWESTRY to at least % functional to show improved function Baseline: Goal status: New Pt will reduce pain by overall 50% overall with usual activity Baseline: Goal status: New Pt will reduce pain to overall less than 3-4/10 with usual activity and work activity. Baseline: Goal status: New Pt will be able to ambulate on uneven surfaces at least 500 ft WNL gait pattern without  complaints Baseline: Goal status: New   7. Pt will be able to perform SLS for 30 seconds on each leg without LOB.  Baseline:   Goal status: New  PLAN: PT FREQUENCY: 1-3 times per week   PT DURATION: 6-8 weeks  PLANNED INTERVENTIONS (unless contraindicated): aquatic PT, Canalith repositioning, cryotherapy, Electrical stimulation, Iontophoresis with 4 mg/ml dexamethasome, Moist heat, traction, Ultrasound, gait training, Therapeutic exercise, balance training, neuromuscular re-education, patient/family education, prosthetic training, manual techniques, passive ROM, dry needling, taping, vasopnuematic device, vestibular, spinal manipulations, joint manipulations  PLAN FOR NEXT SESSION: Assess HEP/update PRN, continue to progress functional mobility, work on balance, work on [employer], jewellery.      Rojean JONELLE Batten, PT 02/22/2024, 7:09 AM

## 2024-02-22 NOTE — Addendum Note (Signed)
 Addended by: MEDFORD ROJEAN SAUNDERS on: 02/22/2024 08:36 AM   Modules accepted: Orders

## 2024-02-25 ENCOUNTER — Other Ambulatory Visit: Payer: Self-pay | Admitting: Family Medicine

## 2024-02-25 ENCOUNTER — Other Ambulatory Visit: Payer: Self-pay | Admitting: "Endocrinology

## 2024-02-25 ENCOUNTER — Other Ambulatory Visit (HOSPITAL_COMMUNITY): Payer: Self-pay

## 2024-02-25 ENCOUNTER — Other Ambulatory Visit (HOSPITAL_BASED_OUTPATIENT_CLINIC_OR_DEPARTMENT_OTHER): Payer: Self-pay

## 2024-02-25 ENCOUNTER — Other Ambulatory Visit: Payer: Self-pay

## 2024-02-25 DIAGNOSIS — E1165 Type 2 diabetes mellitus with hyperglycemia: Secondary | ICD-10-CM

## 2024-02-25 MED ORDER — ROSUVASTATIN CALCIUM 5 MG PO TABS
5.0000 mg | ORAL_TABLET | Freq: Every day | ORAL | 1 refills | Status: AC
  Filled 2024-02-25: qty 90, 90d supply, fill #0
  Filled 2024-03-13 – 2024-05-29 (×2): qty 90, 90d supply, fill #1

## 2024-02-25 MED ORDER — EMPAGLIFLOZIN 25 MG PO TABS
25.0000 mg | ORAL_TABLET | Freq: Every day | ORAL | 1 refills | Status: AC
  Filled 2024-02-25: qty 90, 90d supply, fill #0
  Filled 2024-05-23: qty 90, 90d supply, fill #1

## 2024-02-25 NOTE — Telephone Encounter (Signed)
 Requested Prescriptions   Pending Prescriptions Disp Refills   empagliflozin  (JARDIANCE ) 25 MG TABS tablet 90 tablet 1    Sig: Take 1 tablet (25 mg total) by mouth daily before breakfast.

## 2024-02-28 ENCOUNTER — Other Ambulatory Visit: Payer: Self-pay

## 2024-02-28 ENCOUNTER — Other Ambulatory Visit (HOSPITAL_BASED_OUTPATIENT_CLINIC_OR_DEPARTMENT_OTHER): Payer: Self-pay

## 2024-02-28 ENCOUNTER — Ambulatory Visit (HOSPITAL_BASED_OUTPATIENT_CLINIC_OR_DEPARTMENT_OTHER): Admitting: Physical Therapy

## 2024-02-28 DIAGNOSIS — M5441 Lumbago with sciatica, right side: Secondary | ICD-10-CM | POA: Diagnosis not present

## 2024-02-28 DIAGNOSIS — R29898 Other symptoms and signs involving the musculoskeletal system: Secondary | ICD-10-CM

## 2024-02-28 DIAGNOSIS — M6281 Muscle weakness (generalized): Secondary | ICD-10-CM

## 2024-02-28 DIAGNOSIS — G8929 Other chronic pain: Secondary | ICD-10-CM | POA: Diagnosis not present

## 2024-02-28 NOTE — Therapy (Signed)
 OUTPATIENT PHYSICAL THERAPY THORACOLUMBAR TREATMENT   Patient Name: TAAJ HURLBUT MRN: [id number] DOB:[birth year], 68 y.o., male Today's Date: 02/28/2024  END OF SESSION:     PT End of Session - 02/28/24 1351     Visit Number 2    Number of Visits 8    Date for Recertification  04/10/24    Authorization Type [employer]    PT Start Time 0130    PT Stop Time 0210    PT Time Calculation (min) 40 min    Activity Tolerance Patient tolerated treatment well    Behavior During Therapy [place] for tasks assessed/performed           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   BPPV (benign paroxysmal positional vertigo) 08/14/2014   CAD (coronary artery disease)    2/19 PCI/DESx1 to Lcx   Cervical disc disorder with radiculopathy of cervical region 10/07/2015   Cervical radicular pain 01/10/2019   Chronic cough 04/13/2019   Decreased cardiac ejection fraction 04/02/2015   Diabetes mellitus without complication (HCC)    Dyspnea on exertion 05/15/2015   Erectile dysfunction 07/11/2019   Exercise-induced asthma    Family history of early CAD 07/05/2017   Fatigue 05/15/2015   Hallux rigidus, right foot 09/15/2021   Heart murmur    In early childhood. No issues now   Hemarthrosis of right elbow 02/03/2016   Hoarseness 04/13/2019   Hyperlipidemia    family hx of high cholesterol   Hypertension    Hypogonadism in male    Incomplete rotator cuff tear 09/10/2015   Injected 09/10/2015   Lateral epicondylitis of right elbow 07/28/2019   Low back pain at multiple sites 02/05/2021   Low  vitamin D  level 04/22/2017   Mild persistent asthma/cough variant asthma 04/13/2019   Myofascial pain syndrome, cervical 07/28/2019   Neck pain 06/01/2018   Nonischemic cardiomyopathy (HCC) 05/15/2015   Nontraumatic incomplete tear of right rotator cuff 07/25/2019   Occipital headache 06/01/2018   Onychomycosis 05/25/2018   Osteoarthritis    Pain in right ankle and joints of right foot 10/05/2016   Palpitations 07/06/2017   Pernicious anemia    PONV (postoperative nausea and vomiting)    Preop cardiovascular exam 10/18/2023   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   Shoulder bursitis 10/12/2014   Injected in 10/12/2014  Repeat 01/21/2015   Small thenar eminence 02/03/2016   Spinal stenosis, lumbar region, without neurogenic claudication 10/12/2014   Spondylolisthesis at L5-S1 level 05/09/2021   Strain of latissimus dorsi muscle 01/21/2015   Tendinopathy of right biceps tendon 07/25/2019   Type 2 diabetes mellitus with hyperglycemia, without long-term current use of insulin  (HCC) 12/22/2013   Ulnar neuropathy at elbow of right upper extremity 01/03/2019   Past Surgical History:  Procedure Laterality Date   CATARACT EXTRACTION W/ INTRAOCULAR LENS IMPLANT Bilateral 2023   COLONOSCOPY     CORONARY PRESSURE/FFR STUDY N/A 06/23/2017   Procedure: INTRAVASCULAR PRESSURE WIRE/FFR STUDY;  Surgeon: Anner Alm ORN, MD;  Location: [facility] INVASIVE CV LAB;  Service: Cardiovascular;  Laterality: N/A;   CORONARY STENT INTERVENTION N/A 06/23/2017   Procedure: CORONARY STENT INTERVENTION;  Surgeon: Anner Alm ORN, MD;  Location: [facility] INVASIVE CV LAB;  Service: Cardiovascular;  Laterality: N/A;   KNEE ARTHROSCOPY Left    LEFT HEART CATH AND CORONARY ANGIOGRAPHY N/A 06/23/2017   Procedure: LEFT HEART CATH AND CORONARY ANGIOGRAPHY;  Surgeon: Anner Alm ORN, MD;  Location: [facility] INVASIVE CV LAB;  Service: Cardiovascular;  Laterality: N/A;   LEFT HEART CATH AND CORONARY ANGIOGRAPHY N/A 07/14/2023   Procedure: LEFT  HEART CATH AND CORONARY ANGIOGRAPHY;  Surgeon: Elmira Newman PARAS, MD;  Location: [facility] INVASIVE CV LAB;  Service: Cardiovascular;  Laterality: N/A;   SHOULDER ARTHROSCOPY WITH BICEPSTENOTOMY Right 09/06/2019   Procedure: SHOULDER ARTHROSCOPY WITH BICEPSTENOTOMY;  Surgeon: Jerri Kay HERO, MD;  Location: [facility];  Service: Orthopedics;  Laterality: Right;   SHOULDER ARTHROSCOPY WITH SUBACROMIAL DECOMPRESSION Right 09/06/2019   Procedure: RIGHT SHOULDER ARTHROSCOPY WITH EXTENSIVE DEBRIDEMENT, SUBACROMIAL DECOMPRESSION, BICEPS TENOTOMY;  Surgeon: Jerri Kay HERO, MD;  Location: [facility];  Service: Orthopedics;  Laterality: Right;   TENOTOMY ACHILLES TENDON  2007   In [place], [place]   ULNAR NERVE TRANSPOSITION  2006   In [place], [place]   Patient Active Problem List   Diagnosis Date Noted   Hypertension    PONV (postoperative nausea and vomiting)    Preop cardiovascular exam 10/18/2023   Pernicious anemia    Hallux rigidus, right foot 09/15/2021   Spondylolisthesis at L5-S1 level 05/09/2021   Low back pain at multiple sites 02/05/2021   Osteoarthritis    Heart murmur    Exercise-induced asthma    Diabetes mellitus without complication (HCC)    Arthritis    Anemia    Myofascial pain syndrome, cervical 07/28/2019   Lateral epicondylitis of right elbow 07/28/2019   Nontraumatic incomplete tear of right rotator cuff 07/25/2019   Tendinopathy of right biceps tendon 07/25/2019   Erectile dysfunction 07/11/2019   Mild persistent asthma/cough variant asthma 04/13/2019   Allergic rhinitis with a possible nonallergic component 04/13/2019   Acid reflux 04/13/2019   Chronic cough 04/13/2019   Hoarseness 04/13/2019   Cervical radicular pain 01/10/2019   Ulnar neuropathy at elbow of right upper extremity 01/03/2019   Acute bilateral low back pain without sciatica 01/03/2019   Neck pain 06/01/2018   Occipital headache 06/01/2018   Onychomycosis 05/25/2018    Palpitations 07/06/2017   CAD (coronary artery disease) 07/05/2017   Family history of early CAD 07/05/2017   Abnormal findings on diagnostic imaging of cardiovascular system 06/23/2017   Low vitamin D  level 04/22/2017   Pain in right ankle and joints of right foot 10/05/2016   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   Hemarthrosis of right elbow 02/03/2016   Small thenar eminence 02/03/2016   Cervical disc disorder with radiculopathy of cervical region 10/07/2015   Incomplete rotator cuff tear 09/10/2015   Nonischemic cardiomyopathy (HCC) 05/15/2015   Fatigue 05/15/2015   Dyspnea on exertion 05/15/2015   Decreased cardiac ejection fraction 04/02/2015   Strain of latissimus dorsi muscle 01/21/2015   Shoulder bursitis 10/12/2014   Spinal stenosis, lumbar region, without neurogenic claudication 10/12/2014   BPPV (benign paroxysmal positional vertigo) 08/14/2014   Hyperlipidemia 01/19/2014   Anemia, unspecified 01/19/2014   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   Type 2 diabetes mellitus with hyperglycemia, without long-term current use of insulin  (HCC) 12/22/2013   Hypogonadism in male 12/22/2013   Arthritis of hand, left 12/22/2013    PCP: Frann Mabel Mt, DO  REFERRING PROVIDER: Colon Shove, MD  REFERRING DIAG: Spondylolisthesis, lumbosacral region [  M43.17]   Rationale for Evaluation and Treatment: Rehabilitation  THERAPY DIAG:  Muscle weakness (generalized)  Chronic bilateral low back pain with right-sided sciatica  Other symptoms and signs involving the musculoskeletal system  ONSET DATE: 10/25/23 surgery date.  SUBJECTIVE:                                                                                                                                                                                           SUBJECTIVE STATEMENT:  I have been in a lot of pain this week. The only thing I could get to help was my TENS unit. I did my exercises  last night and I tolerated them fairly well.   Eval: Pt was playing pickleball at [place] in January 2025l. He reports having pain following game and was unable to walk. Started having legs give way. Pain has been consistent prior/ after surgery, but no longer shooting pain. He also noted numbness in bilat LE's into feet. He states that numbness is new. My doctor says  I cannot do anymore damage at this point, He also said not to twist, I dont twist anymore as it flares everything up. I have tried walking on the beach and on uneven surfaces and it continues to cause a lot of pain and difficulty.   PERTINENT HISTORY:  Cardiac, Acid reflux, Anemia, Arthritis, ADD, BPPV, CAD, DM, Dyspnea on exertion, Osteoarthritis.   PAIN:  Are you having pain? Yes: NPRS scale: 9/10 Pain location: L side lower back.  Pain description: Achy pain, sharp pain Aggravating factors: twisting, walking on uneven surfaces.  Relieving factors: Resting.   PRECAUTIONS: None  RED FLAGS: None   WEIGHT BEARING RESTRICTIONS: No  FALLS:  Has patient fallen in last 6 months? No  LIVING ENVIRONMENT: Lives with: lives with their family Lives in: House/apartment Stairs: 24 stairs internally, 3 externally.  Has following equipment at home: None  OCCUPATION: Retired   PLOF: Independent  PATIENT GOALS: Pt would like to get back to walking.   NEXT MD VISIT: None currently.   OBJECTIVE:  Note: Objective measures were completed at Evaluation unless otherwise noted.  DIAGNOSTIC FINDINGS:  1. Status post L4-S1 discectomy and fusion without evidence of complication. The central canal and foramina appear widely patent at the surgical levels. 2. Mild to moderate central canal narrowing at L2-3 and L3-4. 3. Aortic atherosclerosis.   Aortic Atherosclerosis (ICD10-I70.0).  PATIENT SURVEYS:  Oswestry: 21/50  COGNITION: Overall cognitive status: Within functional limits for tasks  assessed     SENSATION: WFL   POSTURE: rounded shoulders and forward head  PALPATION: Tenderness across lower back.   LUMBAR  ROM:   AROM eval  Flexion 15%* hip flexion mostly.   Extension 10%   Right lateral flexion Pt denies movement due to pain  Left lateral flexion Pt denies movement due to pain  Right rotation Pt denies movement due to pain  Left rotation Pt denies movement due to pain   (Blank rows = not tested)  LOWER EXTREMITY ROM:     Active  Right eval Left eval  Hip flexion WFL with reports of pain in lower back  [facility] with reports of pain in lower back   Hip extension    Hip abduction    Hip adduction    Hip internal rotation    Hip external rotation [facility]  Knee flexion [facility]  [facility]   Knee extension WFL with reports of pain in lower back [facility] with reports of pain in lower back   (Blank rows = not tested)  LOWER EXTREMITY MMT:    MMT Right eval Left eval  Hip flexion    Hip extension    Hip abduction    Hip adduction    Hip internal rotation    Hip external rotation 4 4  Knee flexion    Knee extension     (Blank rows = not tested)   FUNCTIONAL TESTS:  5 times sit to stand: 26.22 seconds.   GAIT: Distance walked: 29ft   Assistive device utilized: None Level of assistance: Complete Independence Comments: Pt walks with a slow antalgic gait. Minimal rotation.   TREATMENT DATE:  02/28/24 - Nustep lvl 5, 5 min - LTR to tolerance  - Bridges x15  - SLR 2 x10 - figure 4 stretch  - Supine ball squeezes, 5 sec holds.  - Sideling clams  - LAQ  - HS curl with GTB  - Seated HS stretch     02/21/24                                                                                                                              - Education on self care management.  - Ball squeezes in supine - LTR to tolerance.    PATIENT EDUCATION:  Education details: Educated pt on anatomy and physiology of current symptoms, Oswestry, diagnosis, prognosis, HEP,  and  POC. Person educated: Patient Education method: Explanation, Demonstration, and Handouts Education comprehension: verbalized understanding and returned demonstration  HOME EXERCISE PROGRAM: Access Code: [email or web address] URL: [email or web address] Date: 02/21/2024 Prepared by: Rojean Batten  Exercises - Supine Lower Trunk Rotation  - 1-2 x daily - 7 x weekly - 2-3 sets - 10 reps - Supine Hip Adduction Isometric with Ball  - 1-2 x daily - 7 x weekly - 2-3 sets - 10 reps   ASSESSMENT:  CLINICAL IMPRESSION:  Pt tolerated exercises well today with modifications made as needed. He reported feeling like he was loosening up post session. Updated HEP. Pt will continue to benefit from skilled PT to address continued deficits.  Eval: Patient referred to PT for chronic lower back pain. Pt has fear avoidance and does not perform lumbar flexion or rotation per subjective reports. He has been through extensive PT, but failed. Received surgery in June 2025 for posterior lumbar interbody fusion of L5 and S1. Patient will benefit from skilled PT to address below impairments, limitations and improve overall function.  OBJECTIVE IMPAIRMENTS: decreased activity tolerance, difficulty walking, decreased balance, decreased endurance, decreased mobility, decreased ROM, decreased strength, impaired flexibility, impaired UE/LE use, postural dysfunction, and pain.  ACTIVITY LIMITATIONS: bending, lifting, carry, locomotion, cleaning, community activity, driving, and or occupation  PERSONAL FACTORS: Cardiac, Acid reflux, Anemia, Arthritis, ADD, BPPV, CAD, DM, Dyspnea on exertion, Osteoarthritis.  are also affecting patient's functional outcome.  REHAB POTENTIAL: Good  CLINICAL DECISION MAKING: Evolving/moderate complexity  EVALUATION COMPLEXITY: Moderate    GOALS: Short term PT Goals Target date: 03/06/24 Pt will be I and compliant with HEP. Baseline:  Goal status: New Pt will decrease pain by 25%  overall Baseline: Goal status: New  Long term PT goals Target date: 04/24/2024 Pt will improve ROM to [facility] to improve functional mobility Baseline: Goal status: New Pt will improve hip/knee strength to at least 5-/5 MMT to improve functional strength Baseline: Goal status: New Pt will improve OSWESTRY to at least % functional to show improved function Baseline: Goal status: New Pt will reduce pain by overall 50% overall with usual activity Baseline: Goal status: New Pt will reduce pain to overall less than 3-4/10 with usual activity and work activity. Baseline: Goal status: New Pt will be able to ambulate on uneven surfaces at least 500 ft WNL gait pattern without complaints Baseline: Goal status: New   7. Pt will be able to perform SLS for 30 seconds on each leg without LOB.  Baseline:   Goal status: New  PLAN: PT FREQUENCY: 1-3 times per week   PT DURATION: 6-8 weeks  PLANNED INTERVENTIONS (unless contraindicated): aquatic PT, Canalith repositioning, cryotherapy, Electrical stimulation, Iontophoresis with 4 mg/ml dexamethasome, Moist heat, traction, Ultrasound, gait training, Therapeutic exercise, balance training, neuromuscular re-education, patient/family education, prosthetic training, manual techniques, passive ROM, dry needling, taping, vasopnuematic device, vestibular, spinal manipulations, joint manipulations  PLAN FOR NEXT SESSION: Assess HEP/update PRN, continue to progress functional mobility, work on balance, work on designer, jewellery.  Core control.      Rojean JONELLE Batten, PT 02/28/2024, 2:14 PM

## 2024-03-01 ENCOUNTER — Encounter: Payer: Self-pay | Admitting: Internal Medicine

## 2024-03-01 ENCOUNTER — Ambulatory Visit (INDEPENDENT_AMBULATORY_CARE_PROVIDER_SITE_OTHER): Admitting: Internal Medicine

## 2024-03-01 VITALS — BP 108/62 | HR 76 | Temp 98.6°F | Wt 180.5 lb

## 2024-03-01 DIAGNOSIS — J4530 Mild persistent asthma, uncomplicated: Secondary | ICD-10-CM | POA: Diagnosis not present

## 2024-03-01 DIAGNOSIS — J3089 Other allergic rhinitis: Secondary | ICD-10-CM | POA: Diagnosis not present

## 2024-03-01 DIAGNOSIS — K219 Gastro-esophageal reflux disease without esophagitis: Secondary | ICD-10-CM

## 2024-03-01 DIAGNOSIS — Z8619 Personal history of other infectious and parasitic diseases: Secondary | ICD-10-CM | POA: Diagnosis not present

## 2024-03-01 NOTE — Progress Notes (Unsigned)
 FOLLOW UP Date of Service/Encounter:  03/01/24  Subjective:  Reginald Rios (DOB: 05/23/1955) is a 68 y.o. male who [facility] in re-evaluation of the following: Asthma, rhinitis, thrush, GERD History obtained from: chart review and patient.  For Review, LV was on 07/19/2023 with Wanda Craze, FNP seen for acute visit for worsening cough. See below for summary of history and diagnostics.   Today presents for follow-up. Discussed the use of AI scribe software for clinical note transcription with the patient, who gave verbal consent to proceed.  History of Present Illness Reginald Rios is a 68 year old male with asthma who presents for follow-up of his respiratory condition.  Asthma symptoms and control - Asthma is well-controlled with year-round use of Flonase  and Astelin . - Albuterol  is used occasionally, approximately once every few months, primarily with exposure to dry air or grass. - Avoids triggers such as perfumes, colognes, and cigarette smoke to prevent asthma exacerbations. - No recent asthma exacerbations reported. - prior history of thrush and he is very happy with current level of control as he has not needed his ICS   Respiratory infection precautions - Avoids crowds, especially during winter, to reduce risk of respiratory illness and asthma exacerbations.  Reflux is well controlled on protonix .    All medications reviewed by clinical staff and updated in chart. No new pertinent medical or surgical history except as noted in HPI.  ROS: All others negative except as noted per HPI.   Objective:  BP 108/62 (BP Location: Left Arm, Patient Position: Sitting, Cuff Size: Normal)   Pulse 76   Temp 98.6 F (37 C) (Oral)   Wt 180 lb 8 oz (81.9 kg)   SpO2 100%   BMI 25.90 kg/m  Body mass index is 25.9 kg/m. Physical Exam: General Appearance:  Alert, cooperative, no distress, appears stated age  Head:   Normocephalic, without obvious abnormality, atraumatic  Eyes:  Conjunctiva clear, EOM's intact  Ears EACs normal bilaterally  Nose: Nares normal, normal mucosa, no visible anterior polyps, and septum midline  Throat: Lips, tongue normal; teeth and gums normal, normal posterior oropharynx  Neck: Supple, symmetrical  Lungs:   clear to auscultation bilaterally, Respirations unlabored, no coughing  Heart:  regular rate and rhythm and no murmur, Appears well perfused  Extremities: No edema  Skin: Skin color, texture, turgor normal and no rashes or lesions on visualized portions of skin  Neurologic: No gross deficits   Labs:  Lab Orders  No laboratory test(s) ordered today    Spirometry:  Tracings reviewed. His effort: Good reproducible efforts. FVC:3.26 L FEV1: 2.93 L, 65% predicted FEV1/FVC ratio: 86% Interpretation: Nonobstructive ratio, low FEV1, possible restriction.  At baseline Please see scanned spirometry results for details.    Assessment/Plan      Patient Instructions  Chronic Cough: history of thrush  - much improved  Continue albuterol  HFA, 2 to 4 inhalations every 4-6 hours if needed.   May use albuterol  2 puffs 5 to 15 minutes before  physical activity  Asthma control goals:  Full participation in all desired activities (may need albuterol  before activity) Albuterol  use two time or less a week on average (not counting use with activity) Cough interfering with sleep two time or less a month Oral steroids no more than once a year No hospitalizations    Allergic rhinitis with a nonallergic (irritant) component At your previous visit, Perennial and seasonal epicutaneous tests were negative despite  a positive histamine control.  Intradermal tests were mildly reactive to grass pollen, major mold mix #4, and cockroach antigen. Continue allergen avoidance to the above as well as to irritants (perfumes, strong smells, chemical cleaners, etc) Continue azelastine  nasal  spray, 1 spray per nostril twice daily as needed for congestion.  Continue flonase  1 spray per nostril 1-2 times daily. Aim upward and outward    Acid reflux Appropriate reflux lifestyle modifications have been provided. Continue Pantoprazole  (protonix ) 40 mg nightly.  May take Tums as needed.  Follow  up: 6 months   Other:    Thank you so much for letting me partake in your care today.  Don't hesitate to reach out if you have any additional concerns!  Hargis Springer, MD  [facility] [place], [place]

## 2024-03-01 NOTE — Patient Instructions (Addendum)
 Chronic Cough: history of thrush  - much improved  Continue albuterol  HFA, 2 to 4 inhalations every 4-6 hours if needed.   May use albuterol  2 puffs 5 to 15 minutes before  physical activity Get RSV vaccine   Asthma control goals:  Full participation in all desired activities (may need albuterol  before activity) Albuterol  use two time or less a week on average (not counting use with activity) Cough interfering with sleep two time or less a month Oral steroids no more than once a year No hospitalizations    Allergic rhinitis with a nonallergic (irritant) component At your previous visit, Perennial and seasonal epicutaneous tests were negative despite a positive histamine control.  Intradermal tests were mildly reactive to grass pollen, major mold mix #4, and cockroach antigen. Continue allergen avoidance to the above as well as to irritants (perfumes, strong smells, chemical cleaners, etc) Continue azelastine  nasal spray, 1 spray per nostril twice daily as needed for congestion.  Continue flonase  1 spray per nostril 1-2 times daily. Aim upward and outward    Acid reflux Appropriate reflux lifestyle modifications have been provided. Continue Pantoprazole  (protonix ) 40 mg nightly.  May take Tums as needed.  Follow  up: 6 months

## 2024-03-03 DIAGNOSIS — H33322 Round hole, left eye: Secondary | ICD-10-CM | POA: Diagnosis not present

## 2024-03-06 ENCOUNTER — Telehealth: Payer: Self-pay

## 2024-03-06 ENCOUNTER — Ambulatory Visit (HOSPITAL_BASED_OUTPATIENT_CLINIC_OR_DEPARTMENT_OTHER): Attending: Neurological Surgery | Admitting: Physical Therapy

## 2024-03-06 ENCOUNTER — Other Ambulatory Visit (HOSPITAL_BASED_OUTPATIENT_CLINIC_OR_DEPARTMENT_OTHER): Payer: Self-pay

## 2024-03-06 ENCOUNTER — Encounter (HOSPITAL_BASED_OUTPATIENT_CLINIC_OR_DEPARTMENT_OTHER): Payer: Self-pay | Admitting: Physical Therapy

## 2024-03-06 ENCOUNTER — Other Ambulatory Visit: Payer: Self-pay

## 2024-03-06 DIAGNOSIS — G8929 Other chronic pain: Secondary | ICD-10-CM | POA: Diagnosis not present

## 2024-03-06 DIAGNOSIS — R29898 Other symptoms and signs involving the musculoskeletal system: Secondary | ICD-10-CM | POA: Diagnosis not present

## 2024-03-06 DIAGNOSIS — Z2911 Encounter for prophylactic immunotherapy for respiratory syncytial virus (RSV): Secondary | ICD-10-CM

## 2024-03-06 DIAGNOSIS — M6281 Muscle weakness (generalized): Secondary | ICD-10-CM | POA: Insufficient documentation

## 2024-03-06 DIAGNOSIS — M5441 Lumbago with sciatica, right side: Secondary | ICD-10-CM | POA: Diagnosis not present

## 2024-03-06 MED ORDER — AREXVY 120 MCG/0.5ML IM SUSR
0.5000 mL | Freq: Once | INTRAMUSCULAR | 0 refills | Status: DC
  Filled 2024-03-06 – 2024-03-13 (×5): qty 0.5, 1d supply, fill #0

## 2024-03-06 NOTE — Telephone Encounter (Signed)
 Copied from CRM [id number]. Topic: Clinical - Prescription Issue >> Mar 06, 2024  3:21 PM Alfonso ORN wrote: Reason for CRM: Pharmacy  [facility]) fax: [phone]  stated pt will need rx for rsv shot for insurance to cover . Please contact pt to advise. [phone]

## 2024-03-06 NOTE — Telephone Encounter (Signed)
 Order placed and sent pt message.

## 2024-03-06 NOTE — Therapy (Signed)
 OUTPATIENT PHYSICAL THERAPY THORACOLUMBAR TREATMENT   Patient Name: Reginald Rios MRN: [id number] DOB:[birth year], 68 y.o., male Today's Date: 03/06/2024  END OF SESSION:     PT End of Session - 03/06/24 1437     Visit Number 3    Number of Visits 8    Date for Recertification  04/10/24    Authorization Type [employer]    PT Start Time 1403    PT Stop Time 1442    PT Time Calculation (min) 39 min    Activity Tolerance Patient tolerated treatment well    Behavior During Therapy [facility] for tasks assessed/performed            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   BPPV (benign paroxysmal positional vertigo) 08/14/2014   CAD (coronary artery disease)    2/19 PCI/DESx1 to Lcx   Cervical disc disorder with radiculopathy of cervical region 10/07/2015   Cervical radicular pain 01/10/2019   Chronic cough 04/13/2019   Decreased cardiac ejection fraction 04/02/2015   Diabetes mellitus without complication (HCC)    Dyspnea on exertion 05/15/2015   Erectile dysfunction 07/11/2019   Exercise-induced asthma    Family history of early CAD 07/05/2017   Fatigue 05/15/2015   Hallux rigidus, right foot 09/15/2021   Heart murmur    In early childhood. No issues now   Hemarthrosis of right elbow 02/03/2016   Hoarseness 04/13/2019   Hyperlipidemia    family hx of high cholesterol   Hypertension    Hypogonadism in male    Incomplete rotator cuff tear 09/10/2015   Injected 09/10/2015   Lateral epicondylitis of right elbow 07/28/2019   Low back pain at multiple sites 02/05/2021    Low vitamin D  level 04/22/2017   Mild persistent asthma/cough variant asthma 04/13/2019   Myofascial pain syndrome, cervical 07/28/2019   Neck pain 06/01/2018   Nonischemic cardiomyopathy (HCC) 05/15/2015   Nontraumatic incomplete tear of right rotator cuff 07/25/2019   Occipital headache 06/01/2018   Onychomycosis 05/25/2018   Osteoarthritis    Pain in right ankle and joints of right foot 10/05/2016   Palpitations 07/06/2017   Pernicious anemia    PONV (postoperative nausea and vomiting)    Preop cardiovascular exam 10/18/2023   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   Shoulder bursitis 10/12/2014   Injected in 10/12/2014  Repeat 01/21/2015   Small thenar eminence 02/03/2016   Spinal stenosis, lumbar region, without neurogenic claudication 10/12/2014   Spondylolisthesis at L5-S1 level 05/09/2021   Strain of latissimus dorsi muscle 01/21/2015   Tendinopathy of right biceps tendon 07/25/2019   Type 2 diabetes mellitus with hyperglycemia, without long-term current use of insulin  (HCC) 12/22/2013   Ulnar neuropathy at elbow of right upper extremity 01/03/2019   Past Surgical History:  Procedure Laterality Date   CATARACT EXTRACTION W/ INTRAOCULAR LENS IMPLANT Bilateral 2023   COLONOSCOPY     CORONARY PRESSURE/FFR STUDY N/A 06/23/2017   Procedure: INTRAVASCULAR PRESSURE WIRE/FFR STUDY;  Surgeon: Anner Alm ORN, MD;  Location: [facility] INVASIVE CV LAB;  Service: Cardiovascular;  Laterality: N/A;   CORONARY STENT INTERVENTION N/A 06/23/2017   Procedure: CORONARY STENT  INTERVENTION;  Surgeon: Anner Alm ORN, MD;  Location: [facility] INVASIVE CV LAB;  Service: Cardiovascular;  Laterality: N/A;   KNEE ARTHROSCOPY Left    LEFT HEART CATH AND CORONARY ANGIOGRAPHY N/A 06/23/2017   Procedure: LEFT HEART CATH AND CORONARY ANGIOGRAPHY;  Surgeon: Anner Alm ORN, MD;  Location: [facility] INVASIVE CV LAB;  Service: Cardiovascular;  Laterality: N/A;   LEFT HEART CATH AND CORONARY ANGIOGRAPHY N/A 07/14/2023   Procedure:  LEFT HEART CATH AND CORONARY ANGIOGRAPHY;  Surgeon: Elmira Newman PARAS, MD;  Location: [facility] INVASIVE CV LAB;  Service: Cardiovascular;  Laterality: N/A;   SHOULDER ARTHROSCOPY WITH BICEPSTENOTOMY Right 09/06/2019   Procedure: SHOULDER ARTHROSCOPY WITH BICEPSTENOTOMY;  Surgeon: Jerri Kay HERO, MD;  Location: [facility];  Service: Orthopedics;  Laterality: Right;   SHOULDER ARTHROSCOPY WITH SUBACROMIAL DECOMPRESSION Right 09/06/2019   Procedure: RIGHT SHOULDER ARTHROSCOPY WITH EXTENSIVE DEBRIDEMENT, SUBACROMIAL DECOMPRESSION, BICEPS TENOTOMY;  Surgeon: Jerri Kay HERO, MD;  Location: [facility];  Service: Orthopedics;  Laterality: Right;   TENOTOMY ACHILLES TENDON  2007   In [place], [place]   ULNAR NERVE TRANSPOSITION  2006   In [place], [place]   Patient Active Problem List   Diagnosis Date Noted   Hypertension    PONV (postoperative nausea and vomiting)    Preop cardiovascular exam 10/18/2023   Pernicious anemia    Hallux rigidus, right foot 09/15/2021   Spondylolisthesis at L5-S1 level 05/09/2021   Low back pain at multiple sites 02/05/2021   Osteoarthritis    Heart murmur    Exercise-induced asthma    Diabetes mellitus without complication (HCC)    Arthritis    Anemia    Myofascial pain syndrome, cervical 07/28/2019   Lateral epicondylitis of right elbow 07/28/2019   Nontraumatic incomplete tear of right rotator cuff 07/25/2019   Tendinopathy of right biceps tendon 07/25/2019   Erectile dysfunction 07/11/2019   Mild persistent asthma/cough variant asthma 04/13/2019   Allergic rhinitis with a possible nonallergic component 04/13/2019   Acid reflux 04/13/2019   Chronic cough 04/13/2019   Hoarseness 04/13/2019   Cervical radicular pain 01/10/2019   Ulnar neuropathy at elbow of right upper extremity 01/03/2019   Acute bilateral low back pain without sciatica 01/03/2019   Neck pain 06/01/2018   Occipital headache 06/01/2018   Onychomycosis 05/25/2018    Palpitations 07/06/2017   CAD (coronary artery disease) 07/05/2017   Family history of early CAD 07/05/2017   Abnormal findings on diagnostic imaging of cardiovascular system 06/23/2017   Low vitamin D  level 04/22/2017   Pain in right ankle and joints of right foot 10/05/2016   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   Hemarthrosis of right elbow 02/03/2016   Small thenar eminence 02/03/2016   Cervical disc disorder with radiculopathy of cervical region 10/07/2015   Incomplete rotator cuff tear 09/10/2015   Nonischemic cardiomyopathy (HCC) 05/15/2015   Fatigue 05/15/2015   Dyspnea on exertion 05/15/2015   Decreased cardiac ejection fraction 04/02/2015   Strain of latissimus dorsi muscle 01/21/2015   Shoulder bursitis 10/12/2014   Spinal stenosis, lumbar region, without neurogenic claudication 10/12/2014   BPPV (benign paroxysmal positional vertigo) 08/14/2014   Hyperlipidemia 01/19/2014   Anemia, unspecified 01/19/2014   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   Type 2 diabetes mellitus with hyperglycemia, without long-term current use of insulin  (HCC) 12/22/2013   Hypogonadism in male 12/22/2013   Arthritis of hand, left 12/22/2013    PCP: Frann Mabel Mt, DO  REFERRING PROVIDER: Colon Shove, MD  REFERRING DIAG: Spondylolisthesis,  lumbosacral region [M43.17]   Rationale for Evaluation and Treatment: Rehabilitation  THERAPY DIAG:  Muscle weakness (generalized)  Chronic bilateral low back pain with right-sided sciatica  Other symptoms and signs involving the musculoskeletal system  ONSET DATE: 10/25/23 surgery date.  SUBJECTIVE:                                                                                                                                                                                           SUBJECTIVE STATEMENT:  I have been in a lot of pain this week. The only thing I could get to help was my TENS unit. I did my exercises  last night and I tolerated them fairly well.   Eval: Pt was playing pickleball at [place] in January 2025l. He reports having pain following game and was unable to walk. Started having legs give way. Pain has been consistent prior/ after surgery, but no longer shooting pain. He also noted numbness in bilat LE's into feet. He states that numbness is new. My doctor says  I cannot do anymore damage at this point, He also said not to twist, I dont twist anymore as it flares everything up. I have tried walking on the beach and on uneven surfaces and it continues to cause a lot of pain and difficulty.   PERTINENT HISTORY:  Cardiac, Acid reflux, Anemia, Arthritis, ADD, BPPV, CAD, DM, Dyspnea on exertion, Osteoarthritis.     PAIN:  Are you having pain? Yes: NPRS scale: 9/10 Pain location: L side lower back.  Pain description: Achy pain, sharp pain Aggravating factors: twisting, walking on uneven surfaces.  Relieving factors: Resting.   PRECAUTIONS: None  RED FLAGS: None   WEIGHT BEARING RESTRICTIONS: No  FALLS:  Has patient fallen in last 6 months? No  LIVING ENVIRONMENT: Lives with: lives with their family Lives in: House/apartment Stairs: 24 stairs internally, 3 externally.  Has following equipment at home: None  OCCUPATION: Retired   PLOF: Independent  PATIENT GOALS: Pt would like to get back to walking.   NEXT MD VISIT: None currently.   OBJECTIVE:  Note: Objective measures were completed at Evaluation unless otherwise noted.  DIAGNOSTIC FINDINGS:  1. Status post L4-S1 discectomy and fusion without evidence of complication. The central canal and foramina appear widely patent at the surgical levels. 2. Mild to moderate central canal narrowing at L2-3 and L3-4. 3. Aortic atherosclerosis.   Aortic Atherosclerosis (ICD10-I70.0).  PATIENT SURVEYS:  Oswestry: 21/50  COGNITION: Overall cognitive status: Within functional limits for tasks  assessed     SENSATION: WFL   POSTURE: rounded shoulders and forward head  PALPATION: Tenderness across lower  back.   LUMBAR ROM:   AROM eval  Flexion 15%* hip flexion mostly.   Extension 10%   Right lateral flexion Pt denies movement due to pain  Left lateral flexion Pt denies movement due to pain  Right rotation Pt denies movement due to pain  Left rotation Pt denies movement due to pain   (Blank rows = not tested)  LOWER EXTREMITY ROM:     Active  Right eval Left eval  Hip flexion WFL with reports of pain in lower back  [facility] with reports of pain in lower back   Hip extension    Hip abduction    Hip adduction    Hip internal rotation    Hip external rotation [facility]  Knee flexion [facility]  [facility]   Knee extension WFL with reports of pain in lower back [facility] with reports of pain in lower back   (Blank rows = not tested)  LOWER EXTREMITY MMT:    MMT Right eval Left eval  Hip flexion    Hip extension    Hip abduction    Hip adduction    Hip internal rotation    Hip external rotation 4 4  Knee flexion    Knee extension     (Blank rows = not tested)   FUNCTIONAL TESTS:  5 times sit to stand: 26.22 seconds.   GAIT: Distance walked: 76ft   Assistive device utilized: None Level of assistance: Complete Independence Comments: Pt walks with a slow antalgic gait. Minimal rotation.   TREATMENT DATE:  11/3  STM bilat lumbar paraspinals   LTR 10x PPT 2x10 Seated pelvic tilting A and P 20x Curl up 10x 2s  Anatomy of condition, tissue tolerance and loading tolerance, functional capacity and envelope of function    02/28/24 - Nustep lvl 5, 5 min - LTR to tolerance  - Bridges x15  - SLR 2 x10 - figure 4 stretch  - Supine ball squeezes, 5 sec holds.  - Sideling clams  - LAQ  - HS curl with GTB  - Seated HS stretch     02/21/24                                                                                                                              -  Education on self care management.  - Ball squeezes in supine - LTR to tolerance.    PATIENT EDUCATION:  Education details: Educated pt on anatomy and physiology of current symptoms, Oswestry, diagnosis, prognosis, HEP,  and POC. Person educated: Patient Education method: Explanation, Demonstration, and Handouts Education comprehension: verbalized understanding and returned demonstration  HOME EXERCISE PROGRAM: Access Code: [email or web address] URL: [email or web address] Date: 02/21/2024 Prepared by: Rojean Batten  Exercises - Supine Lower Trunk Rotation  - 1-2 x daily - 7 x weekly - 2-3 sets - 10 reps - Supine Hip Adduction Isometric with Ball  - 1-2 x daily - 7 x weekly - 2-3 sets -  10 reps   ASSESSMENT:  CLINICAL IMPRESSION:  Pt with limited tolerance to movement on land. Pt was able to perform exercise in neutral on table. However pt more likely to benefit from aquatic therapy given fear avoidance in land and intolerance to BW loaded exercise. Pt has limited gait and mobility on land due to increased pain and apprehension. Pt perseverates on his current movement avoidance patterns and will likely have better success in the aquatic environment with movement introduction and progressive lumbopelvic mobility. HEP was updated to lumbopelvic exercise in neutral positions as there was no increase in pain. Pt will continue to benefit from skilled PT to address continued deficits.    Eval: Patient referred to PT for chronic lower back pain. Pt has fear avoidance and does not perform lumbar flexion or rotation per subjective reports. He has been through extensive PT, but failed. Received surgery in June 2025 for posterior lumbar interbody fusion of L5 and S1. Patient will benefit from skilled PT to address below impairments, limitations and improve overall function.  OBJECTIVE IMPAIRMENTS: decreased activity tolerance, difficulty walking, decreased balance, decreased endurance, decreased  mobility, decreased ROM, decreased strength, impaired flexibility, impaired UE/LE use, postural dysfunction, and pain.  ACTIVITY LIMITATIONS: bending, lifting, carry, locomotion, cleaning, community activity, driving, and or occupation  PERSONAL FACTORS: Cardiac, Acid reflux, Anemia, Arthritis, ADD, BPPV, CAD, DM, Dyspnea on exertion, Osteoarthritis.  are also affecting patient's functional outcome.  REHAB POTENTIAL: Good  CLINICAL DECISION MAKING: Evolving/moderate complexity  EVALUATION COMPLEXITY: Moderate    GOALS: Short term PT Goals Target date: 03/06/24 Pt will be I and compliant with HEP. Baseline:  Goal status: New Pt will decrease pain by 25% overall Baseline: Goal status: New  Long term PT goals Target date: 04/24/2024 Pt will improve ROM to [facility] to improve functional mobility Baseline: Goal status: New Pt will improve hip/knee strength to at least 5-/5 MMT to improve functional strength Baseline: Goal status: New Pt will improve OSWESTRY to at least % functional to show improved function Baseline: Goal status: New Pt will reduce pain by overall 50% overall with usual activity Baseline: Goal status: New Pt will reduce pain to overall less than 3-4/10 with usual activity and work activity. Baseline: Goal status: New Pt will be able to ambulate on uneven surfaces at least 500 ft WNL gait pattern without complaints Baseline: Goal status: New   7. Pt will be able to perform SLS for 30 seconds on each leg without LOB.  Baseline:   Goal status: New  PLAN: PT FREQUENCY: 1-3 times per week   PT DURATION: 6-8 weeks  PLANNED INTERVENTIONS (unless contraindicated): aquatic PT, Canalith repositioning, cryotherapy, Electrical stimulation, Iontophoresis with 4 mg/ml dexamethasome, Moist heat, traction, Ultrasound, gait training, Therapeutic exercise, balance training, neuromuscular re-education, patient/family education, prosthetic training, manual techniques, passive  ROM, dry needling, taping, vasopnuematic device, vestibular, spinal manipulations, joint manipulations  PLAN FOR NEXT SESSION: Assess HEP/update PRN, continue to progress functional mobility, work on balance, work on designer, jewellery.  Core control.      Dale Call, PT 03/06/2024, 2:52 PM

## 2024-03-06 NOTE — Telephone Encounter (Signed)
 OK to provide. Thx.

## 2024-03-08 ENCOUNTER — Other Ambulatory Visit (HOSPITAL_BASED_OUTPATIENT_CLINIC_OR_DEPARTMENT_OTHER): Payer: Self-pay

## 2024-03-09 ENCOUNTER — Other Ambulatory Visit (HOSPITAL_BASED_OUTPATIENT_CLINIC_OR_DEPARTMENT_OTHER): Payer: Self-pay

## 2024-03-09 DIAGNOSIS — G548 Other nerve root and plexus disorders: Secondary | ICD-10-CM | POA: Diagnosis not present

## 2024-03-09 DIAGNOSIS — L905 Scar conditions and fibrosis of skin: Secondary | ICD-10-CM | POA: Diagnosis not present

## 2024-03-09 MED ORDER — FLUOCINONIDE 0.05 % EX CREA
1.0000 | TOPICAL_CREAM | CUTANEOUS | 1 refills | Status: AC
  Filled 2024-03-09: qty 60, 30d supply, fill #0
  Filled 2024-03-10: qty 30, 15d supply, fill #0
  Filled 2024-04-06: qty 30, 15d supply, fill #1
  Filled 2024-05-23: qty 30, 30d supply, fill #2

## 2024-03-09 MED ORDER — CLOBETASOL PROPIONATE 0.05 % EX SHAM
1.0000 | MEDICATED_SHAMPOO | CUTANEOUS | 1 refills | Status: AC
  Filled 2024-03-09 – 2024-03-10 (×2): qty 118, 30d supply, fill #0
  Filled 2024-05-23: qty 118, 30d supply, fill #1

## 2024-03-09 MED ORDER — RSVPREF3 VAC RECOMB ADJUVANTED 120 MCG/0.5ML IM SUSR
0.5000 mL | Freq: Once | INTRAMUSCULAR | 0 refills | Status: DC
Start: 2024-03-09 — End: 2024-03-09
  Filled 2024-03-09: qty 0.5, 1d supply, fill #0

## 2024-03-10 ENCOUNTER — Other Ambulatory Visit (HOSPITAL_COMMUNITY): Payer: Self-pay

## 2024-03-10 ENCOUNTER — Other Ambulatory Visit (HOSPITAL_BASED_OUTPATIENT_CLINIC_OR_DEPARTMENT_OTHER): Payer: Self-pay

## 2024-03-10 ENCOUNTER — Other Ambulatory Visit: Payer: Self-pay

## 2024-03-10 ENCOUNTER — Encounter (HOSPITAL_BASED_OUTPATIENT_CLINIC_OR_DEPARTMENT_OTHER): Payer: Self-pay | Admitting: Physical Therapy

## 2024-03-10 ENCOUNTER — Other Ambulatory Visit: Payer: Self-pay | Admitting: Family Medicine

## 2024-03-10 ENCOUNTER — Ambulatory Visit (HOSPITAL_BASED_OUTPATIENT_CLINIC_OR_DEPARTMENT_OTHER): Admitting: Physical Therapy

## 2024-03-10 DIAGNOSIS — M5441 Lumbago with sciatica, right side: Secondary | ICD-10-CM | POA: Diagnosis not present

## 2024-03-10 DIAGNOSIS — R29898 Other symptoms and signs involving the musculoskeletal system: Secondary | ICD-10-CM

## 2024-03-10 DIAGNOSIS — M6281 Muscle weakness (generalized): Secondary | ICD-10-CM

## 2024-03-10 DIAGNOSIS — G8929 Other chronic pain: Secondary | ICD-10-CM | POA: Diagnosis not present

## 2024-03-10 MED ORDER — METHYLPHENIDATE HCL 20 MG PO TABS: 20.0000 mg | ORAL_TABLET | Freq: Two times a day (BID) | ORAL | 0 refills | Status: AC

## 2024-03-10 MED ORDER — METHYLPHENIDATE HCL 20 MG PO TABS
20.0000 mg | ORAL_TABLET | Freq: Two times a day (BID) | ORAL | 0 refills | Status: AC
  Filled 2024-06-07: qty 60, 30d supply, fill #0

## 2024-03-10 MED ORDER — METHYLPHENIDATE HCL 20 MG PO TABS
20.0000 mg | ORAL_TABLET | Freq: Two times a day (BID) | ORAL | 0 refills | Status: AC
  Filled 2024-03-10: qty 60, 30d supply, fill #0

## 2024-03-10 NOTE — Therapy (Signed)
 OUTPATIENT PHYSICAL THERAPY THORACOLUMBAR TREATMENT   Patient Name: Reginald Rios MRN: [id number] DOB:01-10-1956, 68 y.o., male Today's Date: 03/10/2024  END OF SESSION:     PT End of Session - 03/10/24 1559     Visit Number 4    Number of Visits 8    Date for Recertification  04/10/24    Authorization Type [employer]    PT Start Time 1558    PT Stop Time 1636    PT Time Calculation (min) 38 min    Activity Tolerance Patient tolerated treatment well    Behavior During Therapy [facility] for tasks assessed/performed          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   BPPV (benign paroxysmal positional vertigo) 08/14/2014   CAD (coronary artery disease)    2/19 PCI/DESx1 to Lcx   Cervical disc disorder with radiculopathy of cervical region 10/07/2015   Cervical radicular pain 01/10/2019   Chronic cough 04/13/2019   Decreased cardiac ejection fraction 04/02/2015   Diabetes mellitus without complication (HCC)    Dyspnea on exertion 05/15/2015   Erectile dysfunction 07/11/2019   Exercise-induced asthma    Family history of early CAD 07/05/2017   Fatigue 05/15/2015   Hallux rigidus, right foot 09/15/2021   Heart murmur    In early childhood. No issues now   Hemarthrosis of right elbow 02/03/2016   Hoarseness 04/13/2019   Hyperlipidemia    family hx of high cholesterol   Hypertension    Hypogonadism in male    Incomplete rotator cuff tear 09/10/2015   Injected 09/10/2015   Lateral epicondylitis of right elbow 07/28/2019   Low back pain at multiple sites 02/05/2021   Low  vitamin D  level 04/22/2017   Mild persistent asthma/cough variant asthma 04/13/2019   Myofascial pain syndrome, cervical 07/28/2019   Neck pain 06/01/2018   Nonischemic cardiomyopathy (HCC) 05/15/2015   Nontraumatic incomplete tear of right rotator cuff 07/25/2019   Occipital headache 06/01/2018   Onychomycosis 05/25/2018   Osteoarthritis    Pain in right ankle and joints of right foot 10/05/2016   Palpitations 07/06/2017   Pernicious anemia    PONV (postoperative nausea and vomiting)    Preop cardiovascular exam 10/18/2023   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   Shoulder bursitis 10/12/2014   Injected in 10/12/2014  Repeat 01/21/2015   Small thenar eminence 02/03/2016   Spinal stenosis, lumbar region, without neurogenic claudication 10/12/2014   Spondylolisthesis at L5-S1 level 05/09/2021   Strain of latissimus dorsi muscle 01/21/2015   Tendinopathy of right biceps tendon 07/25/2019   Type 2 diabetes mellitus with hyperglycemia, without long-term current use of insulin  (HCC) 12/22/2013   Ulnar neuropathy at elbow of right upper extremity 01/03/2019   Past Surgical History:  Procedure Laterality Date   CATARACT EXTRACTION W/ INTRAOCULAR LENS IMPLANT Bilateral 2023   COLONOSCOPY     CORONARY PRESSURE/FFR STUDY N/A 06/23/2017   Procedure: INTRAVASCULAR PRESSURE WIRE/FFR STUDY;  Surgeon: Anner Alm ORN, MD;  Location: [facility] INVASIVE CV LAB;  Service: Cardiovascular;  Laterality: N/A;   CORONARY STENT INTERVENTION N/A 06/23/2017   Procedure: CORONARY STENT INTERVENTION;  Surgeon: Anner Alm ORN, MD;  Location: [facility] INVASIVE CV LAB;  Service: Cardiovascular;  Laterality: N/A;   KNEE ARTHROSCOPY Left    LEFT HEART CATH AND CORONARY ANGIOGRAPHY N/A 06/23/2017   Procedure: LEFT HEART CATH AND CORONARY ANGIOGRAPHY;  Surgeon: Anner Alm ORN, MD;  Location: [facility] INVASIVE CV LAB;  Service: Cardiovascular;  Laterality: N/A;   LEFT HEART CATH AND CORONARY ANGIOGRAPHY N/A 07/14/2023   Procedure: LEFT  HEART CATH AND CORONARY ANGIOGRAPHY;  Surgeon: Elmira Newman PARAS, MD;  Location: [facility] INVASIVE CV LAB;  Service: Cardiovascular;  Laterality: N/A;   SHOULDER ARTHROSCOPY WITH BICEPSTENOTOMY Right 09/06/2019   Procedure: SHOULDER ARTHROSCOPY WITH BICEPSTENOTOMY;  Surgeon: Jerri Kay HERO, MD;  Location: [facility];  Service: Orthopedics;  Laterality: Right;   SHOULDER ARTHROSCOPY WITH SUBACROMIAL DECOMPRESSION Right 09/06/2019   Procedure: RIGHT SHOULDER ARTHROSCOPY WITH EXTENSIVE DEBRIDEMENT, SUBACROMIAL DECOMPRESSION, BICEPS TENOTOMY;  Surgeon: Jerri Kay HERO, MD;  Location:  SURGERY CENTER;  Service: Orthopedics;  Laterality: Right;   TENOTOMY ACHILLES TENDON  2007   In [place], [place]   ULNAR NERVE TRANSPOSITION  2006   In [place], [place]   Patient Active Problem List   Diagnosis Date Noted   Hypertension    PONV (postoperative nausea and vomiting)    Preop cardiovascular exam 10/18/2023   Pernicious anemia    Hallux rigidus, right foot 09/15/2021   Spondylolisthesis at L5-S1 level 05/09/2021   Low back pain at multiple sites 02/05/2021   Osteoarthritis    Heart murmur    Exercise-induced asthma    Diabetes mellitus without complication (HCC)    Arthritis    Anemia    Myofascial pain syndrome, cervical 07/28/2019   Lateral epicondylitis of right elbow 07/28/2019   Nontraumatic incomplete tear of right rotator cuff 07/25/2019   Tendinopathy of right biceps tendon 07/25/2019   Erectile dysfunction 07/11/2019   Mild persistent asthma/cough variant asthma 04/13/2019   Allergic rhinitis with a possible nonallergic component 04/13/2019   Acid reflux 04/13/2019   Chronic cough 04/13/2019   Hoarseness 04/13/2019   Cervical radicular pain 01/10/2019   Ulnar neuropathy at elbow of right upper extremity 01/03/2019   Acute bilateral low back pain without sciatica 01/03/2019   Neck pain 06/01/2018   Occipital headache 06/01/2018   Onychomycosis 05/25/2018    Palpitations 07/06/2017   CAD (coronary artery disease) 07/05/2017   Family history of early CAD 07/05/2017   Abnormal findings on diagnostic imaging of cardiovascular system 06/23/2017   Low vitamin D  level 04/22/2017   Pain in right ankle and joints of right foot 10/05/2016   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   Hemarthrosis of right elbow 02/03/2016   Small thenar eminence 02/03/2016   Cervical disc disorder with radiculopathy of cervical region 10/07/2015   Incomplete rotator cuff tear 09/10/2015   Nonischemic cardiomyopathy (HCC) 05/15/2015   Fatigue 05/15/2015   Dyspnea on exertion 05/15/2015   Decreased cardiac ejection fraction 04/02/2015   Strain of latissimus dorsi muscle 01/21/2015   Shoulder bursitis 10/12/2014   Spinal stenosis, lumbar region, without neurogenic claudication 10/12/2014   BPPV (benign paroxysmal positional vertigo) 08/14/2014   Hyperlipidemia 01/19/2014   Anemia, unspecified 01/19/2014   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   Type 2 diabetes mellitus with hyperglycemia, without long-term current use of insulin  (HCC) 12/22/2013   Hypogonadism in male 12/22/2013   Arthritis of hand, left 12/22/2013    PCP: Frann Mabel Mt, DO  REFERRING PROVIDER: Colon Shove, MD  REFERRING DIAG: Spondylolisthesis, lumbosacral region [  M43.17]   Rationale for Evaluation and Treatment: Rehabilitation  THERAPY DIAG:  Muscle weakness (generalized)  Chronic bilateral low back pain with right-sided sciatica  Other symptoms and signs involving the musculoskeletal system  ONSET DATE: 10/25/23 surgery date.  SUBJECTIVE:                                                                                                                                                                                           SUBJECTIVE STATEMENT: Pt reports that the pain remains unchanged.  He reports he remains fearful of twisting and hurting himself. He  states that the [facility] performed last session was helpful, but didn't last.     Eval: Pt was playing pickleball at [place] in January 2025l. He reports having pain following game and was unable to walk. Started having legs give way. Pain has been consistent prior/ after surgery, but no longer shooting pain. He also noted numbness in bilat LE's into feet. He states that numbness is new. My doctor says  I cannot do anymore damage at this point, He also said not to twist, I dont twist anymore as it flares everything up. I have tried walking on the beach and on uneven surfaces and it continues to cause a lot of pain and difficulty.   PERTINENT HISTORY:  Cardiac, Acid reflux, Anemia, Arthritis, ADD, BPPV, CAD, DM, Dyspnea on exertion, Osteoarthritis.     PAIN:  Are you having pain? Yes: NPRS scale: 6-8/10 Pain location: bil lower back.  Pain description: Achy pain, sharp pain Aggravating factors: twisting, walking on uneven surfaces.  Relieving factors: Resting.   PRECAUTIONS: None  RED FLAGS: None   WEIGHT BEARING RESTRICTIONS: No  FALLS:  Has patient fallen in last 6 months? No  LIVING ENVIRONMENT: Lives with: lives with their family Lives in: House/apartment Stairs: 24 stairs internally, 3 externally.  Has following equipment at home: None  OCCUPATION: Retired   PLOF: Independent  PATIENT GOALS: Pt would like to get back to walking.   NEXT MD VISIT: None currently.   OBJECTIVE:  Note: Objective measures were completed at Evaluation unless otherwise noted.  DIAGNOSTIC FINDINGS:  1. Status post L4-S1 discectomy and fusion without evidence of complication. The central canal and foramina appear widely patent at the surgical levels. 2. Mild to moderate central canal narrowing at L2-3 and L3-4. 3. Aortic atherosclerosis.   Aortic Atherosclerosis (ICD10-I70.0).  PATIENT SURVEYS:  Oswestry: 21/50  COGNITION: Overall cognitive status: Within functional limits for tasks  assessed     SENSATION: WFL   POSTURE: rounded shoulders and forward head  PALPATION: Tenderness across lower back.   LUMBAR ROM:  AROM eval  Flexion 15%* hip flexion mostly.   Extension 10%   Right lateral flexion Pt denies movement due to pain  Left lateral flexion Pt denies movement due to pain  Right rotation Pt denies movement due to pain  Left rotation Pt denies movement due to pain   (Blank rows = not tested)  LOWER EXTREMITY ROM:     Active  Right eval Left eval  Hip flexion WFL with reports of pain in lower back  [facility] with reports of pain in lower back   Hip extension    Hip abduction    Hip adduction    Hip internal rotation    Hip external rotation [facility]  Knee flexion [facility]  [facility]   Knee extension WFL with reports of pain in lower back [facility] with reports of pain in lower back   (Blank rows = not tested)  LOWER EXTREMITY MMT:    MMT Right eval Left eval  Hip flexion    Hip extension    Hip abduction    Hip adduction    Hip internal rotation    Hip external rotation 4 4  Knee flexion    Knee extension     (Blank rows = not tested)   FUNCTIONAL TESTS:  5 times sit to stand: 26.22 seconds.   GAIT: Distance walked: 73ft   Assistive device utilized: None Level of assistance: Complete Independence Comments: Pt walks with a slow antalgic gait. Minimal rotation.   TREATMENT DATE:   [facility] Adult PT Treatment:                                             Date: 11/7 Pt seen for aquatic therapy today.  Treatment took place in water 3.5-4.75 ft in depth at the [facility] pool. Temp of water was 91.  Pt entered/exited the pool via stairs independently with bil rail.  - Intro to aquatic therapy principles - unsupported -> UE on walking forward/ backward -> marching forward / backward with row motion  - side stepping with arm horz add/abdct with yellow hand floats  - UE on wall:  toe/heel raises x10; hip add/abd 2x10 ; hip flexion /extension x8  (pain with hip extension); relaxed squats x10 - TrA set with hollow noodle pull down to thighs x 10  - straddling noodle with UE support on corner: cycling  - STS from 3rd step with forward arm reach x 4  Pt requires the buoyancy and hydrostatic pressure of water for support, and to offload joints by unweighting joint load by at least 50 % in navel deep water and by at least 75-80% in chest to neck deep water.  Viscosity of the water is needed for resistance of strengthening. Water current perturbations provides challenge to standing balance requiring increased core activation.     11/3 STM bilat lumbar paraspinals   LTR 10x PPT 2x10 Seated pelvic tilting A and P 20x Curl up 10x 2s  Anatomy of condition, tissue tolerance and loading tolerance, functional capacity and envelope of function    02/28/24 - Nustep lvl 5, 5 min - LTR to tolerance  - Bridges x15  - SLR 2 x10 - figure 4 stretch  - Supine ball squeezes, 5 sec holds.  - Sideling clams  - LAQ  - HS curl with GTB  - Seated HS stretch  02/21/24                                                                                                                              - Education on self care management.  - Ball squeezes in supine - LTR to tolerance.    PATIENT EDUCATION:  Education details: Educated pt on anatomy and physiology of current symptoms, intro to aquatic therapy, posture and body mechanics Person educated: Patient Education method: Explanation, Demonstration, and Handouts Education comprehension: verbalized understanding and returned demonstration  HOME EXERCISE PROGRAM: Access Code: [email or web address] URL: [email or web address] Date: 02/21/2024 Prepared by: Rojean Batten  Exercises - Supine Lower Trunk Rotation  - 1-2 x daily - 7 x weekly - 2-3 sets - 10 reps - Supine Hip Adduction Isometric with Ball  - 1-2 x daily - 7 x weekly - 2-3 sets - 10 reps   ASSESSMENT:  CLINICAL IMPRESSION:   Pt reported gradual reduction of tightness and pain, Not as acute.  Pt perseverates on his current movement avoidance patterns and requires cues to redirect to progressive lumbopelvic mobility. Pt would benefit from retraining in body mechanics for sit to/from stand and squatting to pick up objects. Issued body mechanics hand out today.  Pt will continue to benefit from skilled PT to address continued deficits. Will plan to create aquatic HEP and issue for use at [facility] pool during member hours.     Eval: Patient referred to PT for chronic lower back pain. Pt has fear avoidance and does not perform lumbar flexion or rotation per subjective reports. He has been through extensive PT, but failed. Received surgery in June 2025 for posterior lumbar interbody fusion of L5 and S1. Patient will benefit from skilled PT to address below impairments, limitations and improve overall function.  OBJECTIVE IMPAIRMENTS: decreased activity tolerance, difficulty walking, decreased balance, decreased endurance, decreased mobility, decreased ROM, decreased strength, impaired flexibility, impaired UE/LE use, postural dysfunction, and pain.  ACTIVITY LIMITATIONS: bending, lifting, carry, locomotion, cleaning, community activity, driving, and or occupation  PERSONAL FACTORS: Cardiac, Acid reflux, Anemia, Arthritis, ADD, BPPV, CAD, DM, Dyspnea on exertion, Osteoarthritis.  are also affecting patient's functional outcome.  REHAB POTENTIAL: Good  CLINICAL DECISION MAKING: Evolving/moderate complexity  EVALUATION COMPLEXITY: Moderate    GOALS: Short term PT Goals Target date: 03/06/24 Pt will be I and compliant with HEP. Baseline:  Goal status: New Pt will decrease pain by 25% overall Baseline: Goal status: New  Long term PT goals Target date: 04/24/2024 Pt will improve ROM to [facility] to improve functional mobility Baseline: Goal status: New Pt will improve hip/knee strength to at least 5-/5 MMT to improve  functional strength Baseline: Goal status: New Pt will improve OSWESTRY to at least % functional to show improved function Baseline: Goal status: New Pt will reduce pain by overall 50% overall with usual activity Baseline: Goal status: New Pt will reduce pain to overall less than 3-4/10 with usual activity and work activity. Baseline:  Goal status: New Pt will be able to ambulate on uneven surfaces at least 500 ft WNL gait pattern without complaints Baseline: Goal status: New   7. Pt will be able to perform SLS for 30 seconds on each leg without LOB.  Baseline:   Goal status: New  PLAN: PT FREQUENCY: 1-3 times per week   PT DURATION: 6-8 weeks  PLANNED INTERVENTIONS (unless contraindicated): aquatic PT, Canalith repositioning, cryotherapy, Electrical stimulation, Iontophoresis with 4 mg/ml dexamethasome, Moist heat, traction, Ultrasound, gait training, Therapeutic exercise, balance training, neuromuscular re-education, patient/family education, prosthetic training, manual techniques, passive ROM, dry needling, taping, vasopnuematic device, vestibular, spinal manipulations, joint manipulations  PLAN FOR NEXT SESSION: Assess HEP/update PRN, continue to progress functional mobility, work on balance, work on designer, jewellery.  Core control.   Delon Aquas, PTA 03/10/24 5:02 PM [facility] [address] [place], [place], [address] Phone: [phone]   Fax:  [phone]

## 2024-03-10 NOTE — Telephone Encounter (Signed)
 Requesting: Ritalin  20mg  Contract:08/09/23 UDS:08/09/23 Last Visit: 02/11/24 Next Visit:08/08/24 Last Refill: see med list  Please Advise

## 2024-03-13 ENCOUNTER — Other Ambulatory Visit: Payer: Self-pay | Admitting: Cardiology

## 2024-03-13 ENCOUNTER — Other Ambulatory Visit (HOSPITAL_BASED_OUTPATIENT_CLINIC_OR_DEPARTMENT_OTHER): Payer: Self-pay

## 2024-03-13 ENCOUNTER — Ambulatory Visit (HOSPITAL_BASED_OUTPATIENT_CLINIC_OR_DEPARTMENT_OTHER): Admitting: Physical Therapy

## 2024-03-13 ENCOUNTER — Encounter (HOSPITAL_BASED_OUTPATIENT_CLINIC_OR_DEPARTMENT_OTHER): Payer: Self-pay | Admitting: Physical Therapy

## 2024-03-13 ENCOUNTER — Other Ambulatory Visit: Payer: Self-pay | Admitting: Family Medicine

## 2024-03-13 DIAGNOSIS — G8929 Other chronic pain: Secondary | ICD-10-CM

## 2024-03-13 DIAGNOSIS — M6281 Muscle weakness (generalized): Secondary | ICD-10-CM

## 2024-03-13 DIAGNOSIS — M5441 Lumbago with sciatica, right side: Secondary | ICD-10-CM | POA: Diagnosis not present

## 2024-03-13 DIAGNOSIS — R29898 Other symptoms and signs involving the musculoskeletal system: Secondary | ICD-10-CM | POA: Diagnosis not present

## 2024-03-13 NOTE — Therapy (Signed)
 OUTPATIENT PHYSICAL THERAPY THORACOLUMBAR TREATMENT   Patient Name: Reginald Rios MRN: [id number] DOB:09/22/1955, 68 y.o., male Today's Date: 03/13/2024  END OF SESSION:     PT End of Session - 03/13/24 1442     Visit Number 5    Number of Visits 8    Date for Recertification  04/10/24    Authorization Type [employer]    PT Start Time 1445    PT Stop Time 1525    PT Time Calculation (min) 40 min    Activity Tolerance Patient tolerated treatment well    Behavior During Therapy [facility] for tasks assessed/performed          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   BPPV (benign paroxysmal positional vertigo) 08/14/2014   CAD (coronary artery disease)    2/19 PCI/DESx1 to Lcx   Cervical disc disorder with radiculopathy of cervical region 10/07/2015   Cervical radicular pain 01/10/2019   Chronic cough 04/13/2019   Decreased cardiac ejection fraction 04/02/2015   Diabetes mellitus without complication (HCC)    Dyspnea on exertion 05/15/2015   Erectile dysfunction 07/11/2019   Exercise-induced asthma    Family history of early CAD 07/05/2017   Fatigue 05/15/2015   Hallux rigidus, right foot 09/15/2021   Heart murmur    In early childhood. No issues now   Hemarthrosis of right elbow 02/03/2016   Hoarseness 04/13/2019   Hyperlipidemia    family hx of high cholesterol   Hypertension    Hypogonadism in male    Incomplete rotator cuff tear 09/10/2015   Injected 09/10/2015   Lateral epicondylitis of right elbow 07/28/2019   Low back pain at multiple sites 02/05/2021   Low  vitamin D  level 04/22/2017   Mild persistent asthma/cough variant asthma 04/13/2019   Myofascial pain syndrome, cervical 07/28/2019   Neck pain 06/01/2018   Nonischemic cardiomyopathy (HCC) 05/15/2015   Nontraumatic incomplete tear of right rotator cuff 07/25/2019   Occipital headache 06/01/2018   Onychomycosis 05/25/2018   Osteoarthritis    Pain in right ankle and joints of right foot 10/05/2016   Palpitations 07/06/2017   Pernicious anemia    PONV (postoperative nausea and vomiting)    Preop cardiovascular exam 10/18/2023   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   Shoulder bursitis 10/12/2014   Injected in 10/12/2014  Repeat 01/21/2015   Small thenar eminence 02/03/2016   Spinal stenosis, lumbar region, without neurogenic claudication 10/12/2014   Spondylolisthesis at L5-S1 level 05/09/2021   Strain of latissimus dorsi muscle 01/21/2015   Tendinopathy of right biceps tendon 07/25/2019   Type 2 diabetes mellitus with hyperglycemia, without long-term current use of insulin  (HCC) 12/22/2013   Ulnar neuropathy at elbow of right upper extremity 01/03/2019   Past Surgical History:  Procedure Laterality Date   CATARACT EXTRACTION W/ INTRAOCULAR LENS IMPLANT Bilateral 2023   COLONOSCOPY     CORONARY PRESSURE/FFR STUDY N/A 06/23/2017   Procedure: INTRAVASCULAR PRESSURE WIRE/FFR STUDY;  Surgeon: Anner Alm ORN, MD;  Location: [facility] INVASIVE CV LAB;  Service: Cardiovascular;  Laterality: N/A;   CORONARY STENT INTERVENTION N/A 06/23/2017   Procedure: CORONARY STENT INTERVENTION;  Surgeon: Anner Alm ORN, MD;  Location: [facility] INVASIVE CV LAB;  Service: Cardiovascular;  Laterality: N/A;   KNEE ARTHROSCOPY Left    LEFT HEART CATH AND CORONARY ANGIOGRAPHY N/A 06/23/2017   Procedure: LEFT HEART CATH AND CORONARY ANGIOGRAPHY;  Surgeon: Anner Alm ORN, MD;  Location: [facility] INVASIVE CV LAB;  Service: Cardiovascular;  Laterality: N/A;   LEFT HEART CATH AND CORONARY ANGIOGRAPHY N/A 07/14/2023   Procedure: LEFT  HEART CATH AND CORONARY ANGIOGRAPHY;  Surgeon: Elmira Newman PARAS, MD;  Location: [facility] INVASIVE CV LAB;  Service: Cardiovascular;  Laterality: N/A;   SHOULDER ARTHROSCOPY WITH BICEPSTENOTOMY Right 09/06/2019   Procedure: SHOULDER ARTHROSCOPY WITH BICEPSTENOTOMY;  Surgeon: Jerri Kay HERO, MD;  Location: [facility];  Service: Orthopedics;  Laterality: Right;   SHOULDER ARTHROSCOPY WITH SUBACROMIAL DECOMPRESSION Right 09/06/2019   Procedure: RIGHT SHOULDER ARTHROSCOPY WITH EXTENSIVE DEBRIDEMENT, SUBACROMIAL DECOMPRESSION, BICEPS TENOTOMY;  Surgeon: Jerri Kay HERO, MD;  Location: [facility];  Service: Orthopedics;  Laterality: Right;   TENOTOMY ACHILLES TENDON  2007   In [place], [place]   ULNAR NERVE TRANSPOSITION  2006   In [place], [place]   Patient Active Problem List   Diagnosis Date Noted   Hypertension    PONV (postoperative nausea and vomiting)    Preop cardiovascular exam 10/18/2023   Pernicious anemia    Hallux rigidus, right foot 09/15/2021   Spondylolisthesis at L5-S1 level 05/09/2021   Low back pain at multiple sites 02/05/2021   Osteoarthritis    Heart murmur    Exercise-induced asthma    Diabetes mellitus without complication (HCC)    Arthritis    Anemia    Myofascial pain syndrome, cervical 07/28/2019   Lateral epicondylitis of right elbow 07/28/2019   Nontraumatic incomplete tear of right rotator cuff 07/25/2019   Tendinopathy of right biceps tendon 07/25/2019   Erectile dysfunction 07/11/2019   Mild persistent asthma/cough variant asthma 04/13/2019   Allergic rhinitis with a possible nonallergic component 04/13/2019   Acid reflux 04/13/2019   Chronic cough 04/13/2019   Hoarseness 04/13/2019   Cervical radicular pain 01/10/2019   Ulnar neuropathy at elbow of right upper extremity 01/03/2019   Acute bilateral low back pain without sciatica 01/03/2019   Neck pain 06/01/2018   Occipital headache 06/01/2018   Onychomycosis 05/25/2018    Palpitations 07/06/2017   CAD (coronary artery disease) 07/05/2017   Family history of early CAD 07/05/2017   Abnormal findings on diagnostic imaging of cardiovascular system 06/23/2017   Low vitamin D  level 04/22/2017   Pain in right ankle and joints of right foot 10/05/2016   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   Hemarthrosis of right elbow 02/03/2016   Small thenar eminence 02/03/2016   Cervical disc disorder with radiculopathy of cervical region 10/07/2015   Incomplete rotator cuff tear 09/10/2015   Nonischemic cardiomyopathy (HCC) 05/15/2015   Fatigue 05/15/2015   Dyspnea on exertion 05/15/2015   Decreased cardiac ejection fraction 04/02/2015   Strain of latissimus dorsi muscle 01/21/2015   Shoulder bursitis 10/12/2014   Spinal stenosis, lumbar region, without neurogenic claudication 10/12/2014   BPPV (benign paroxysmal positional vertigo) 08/14/2014   Hyperlipidemia 01/19/2014   Anemia, unspecified 01/19/2014   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   Type 2 diabetes mellitus with hyperglycemia, without long-term current use of insulin  (HCC) 12/22/2013   Hypogonadism in male 12/22/2013   Arthritis of hand, left 12/22/2013    PCP: Frann Mabel Mt, DO  REFERRING PROVIDER: Colon Shove, MD  REFERRING DIAG: Spondylolisthesis, lumbosacral region [  M43.17]   Rationale for Evaluation and Treatment: Rehabilitation  THERAPY DIAG:  Muscle weakness (generalized)  Chronic bilateral low back pain with right-sided sciatica  Other symptoms and signs involving the musculoskeletal system  ONSET DATE: 10/25/23 surgery date.  SUBJECTIVE:                                                                                                                                                                                           SUBJECTIVE STATEMENT: Pt reports that the pool was very tough and he had diffuse pain that turned into sharp. He required muscle relaxers  and tylenol . Bending forward and twisting are painful.     Eval: Pt was playing pickleball at [place] in January 2025l. He reports having pain following game and was unable to walk. Started having legs give way. Pain has been consistent prior/ after surgery, but no longer shooting pain. He also noted numbness in bilat LE's into feet. He states that numbness is new. My doctor says  I cannot do anymore damage at this point, He also said not to twist, I dont twist anymore as it flares everything up. I have tried walking on the beach and on uneven surfaces and it continues to cause a lot of pain and difficulty.   PERTINENT HISTORY:  Cardiac, Acid reflux, Anemia, Arthritis, ADD, BPPV, CAD, DM, Dyspnea on exertion, Osteoarthritis.     PAIN:  Are you having pain? Yes: NPRS scale: 6-8/10 Pain location: bil lower back.  Pain description: Achy pain, sharp pain Aggravating factors: twisting, walking on uneven surfaces.  Relieving factors: Resting.   PRECAUTIONS: None  RED FLAGS: None   WEIGHT BEARING RESTRICTIONS: No  FALLS:  Has patient fallen in last 6 months? No  LIVING ENVIRONMENT: Lives with: lives with their family Lives in: House/apartment Stairs: 24 stairs internally, 3 externally.  Has following equipment at home: None  OCCUPATION: Retired   PLOF: Independent  PATIENT GOALS: Pt would like to get back to walking.   NEXT MD VISIT: None currently.   OBJECTIVE:  Note: Objective measures were completed at Evaluation unless otherwise noted.  DIAGNOSTIC FINDINGS:  1. Status post L4-S1 discectomy and fusion without evidence of complication. The central canal and foramina appear widely patent at the surgical levels. 2. Mild to moderate central canal narrowing at L2-3 and L3-4. 3. Aortic atherosclerosis.   Aortic Atherosclerosis (ICD10-I70.0).  PATIENT SURVEYS:  Oswestry: 21/50  COGNITION: Overall cognitive status: Within functional limits for tasks  assessed     SENSATION: WFL   POSTURE: rounded shoulders and forward head  PALPATION: Tenderness across lower back.   LUMBAR ROM:  AROM eval  Flexion 15%* hip flexion mostly.   Extension 10%   Right lateral flexion Pt denies movement due to pain  Left lateral flexion Pt denies movement due to pain  Right rotation Pt denies movement due to pain  Left rotation Pt denies movement due to pain   (Blank rows = not tested)  LOWER EXTREMITY ROM:     Active  Right eval Left eval  Hip flexion WFL with reports of pain in lower back  [facility] with reports of pain in lower back   Hip extension    Hip abduction    Hip adduction    Hip internal rotation    Hip external rotation [facility]  Knee flexion [facility]  [facility]   Knee extension WFL with reports of pain in lower back [facility] with reports of pain in lower back   (Blank rows = not tested)  LOWER EXTREMITY MMT:    MMT Right eval Left eval  Hip flexion    Hip extension    Hip abduction    Hip adduction    Hip internal rotation    Hip external rotation 4 4  Knee flexion    Knee extension     (Blank rows = not tested)   FUNCTIONAL TESTS:  5 times sit to stand: 26.22 seconds.   GAIT: Distance walked: 32ft   Assistive device utilized: None Level of assistance: Complete Independence Comments: Pt walks with a slow antalgic gait. Minimal rotation.   TREATMENT DATE:  11/3 STM bilat lumbar paraspinals   LTR 2x10 (half turns, pushing through legs to simulate rolling in bed)    Pain neuro-science, desensitization techniques, progressive pool exercise,   [facility] Adult PT Treatment:                                             Date: 11/7 Pt seen for aquatic therapy today.  Treatment took place in water 3.5-4.75 ft in depth at the [facility] pool. Temp of water was 91.  Pt entered/exited the pool via stairs independently with bil rail.  - Intro to aquatic therapy principles - unsupported -> UE on walking forward/ backward ->  marching forward / backward with row motion  - side stepping with arm horz add/abdct with yellow hand floats  - UE on wall:  toe/heel raises x10; hip add/abd 2x10 ; hip flexion /extension x8 (pain with hip extension); relaxed squats x10 - TrA set with hollow noodle pull down to thighs x 10  - straddling noodle with UE support on corner: cycling  - STS from 3rd step with forward arm reach x 4  Pt requires the buoyancy and hydrostatic pressure of water for support, and to offload joints by unweighting joint load by at least 50 % in navel deep water and by at least 75-80% in chest to neck deep water.  Viscosity of the water is needed for resistance of strengthening. Water current perturbations provides challenge to standing balance requiring increased core activation.     11/3 STM bilat lumbar paraspinals   LTR 10x PPT 2x10 Seated pelvic tilting A and P 20x Curl up 10x 2s  Anatomy of condition, tissue tolerance and loading tolerance, functional capacity and envelope of function    02/28/24 - Nustep lvl 5, 5 min - LTR to tolerance  - Bridges x15  - SLR 2 x10 - figure 4  stretch  - Supine ball squeezes, 5 sec holds.  - Sideling clams  - LAQ  - HS curl with GTB  - Seated HS stretch     02/21/24                                                                                                                              - Education on self care management.  - Ball squeezes in supine - LTR to tolerance.    PATIENT EDUCATION:  Education details: Educated pt on anatomy and physiology of current symptoms, intro to aquatic therapy, posture and body mechanics Person educated: Patient Education method: Explanation, Demonstration, and Handouts Education comprehension: verbalized understanding and returned demonstration  HOME EXERCISE PROGRAM: Access Code: [email or web address] URL: [email or web address] Date: 02/21/2024 Prepared by: Rojean Batten  Exercises - Supine Lower Trunk  Rotation  - 1-2 x daily - 7 x weekly - 2-3 sets - 10 reps - Supine Hip Adduction Isometric with Ball  - 1-2 x daily - 7 x weekly - 2-3 sets - 10 reps   ASSESSMENT:  CLINICAL IMPRESSION:   Pt able to start light rotational exercise and STS with proper cuing of ab bracing. Pt provided on edu about desensitization options with his current pain and heightened sensitivity with gentle ROM with ADL. Pt also advised on considering speaking to mental health counselor to address chronic pain from psycho-social standpoint.  Pt will continue to benefit from skilled PT to address continued deficits.    Eval: Patient referred to PT for chronic lower back pain. Pt has fear avoidance and does not perform lumbar flexion or rotation per subjective reports. He has been through extensive PT, but failed. Received surgery in June 2025 for posterior lumbar interbody fusion of L5 and S1. Patient will benefit from skilled PT to address below impairments, limitations and improve overall function.  OBJECTIVE IMPAIRMENTS: decreased activity tolerance, difficulty walking, decreased balance, decreased endurance, decreased mobility, decreased ROM, decreased strength, impaired flexibility, impaired UE/LE use, postural dysfunction, and pain.  ACTIVITY LIMITATIONS: bending, lifting, carry, locomotion, cleaning, community activity, driving, and or occupation  PERSONAL FACTORS: Cardiac, Acid reflux, Anemia, Arthritis, ADD, BPPV, CAD, DM, Dyspnea on exertion, Osteoarthritis.  are also affecting patient's functional outcome.  REHAB POTENTIAL: Good  CLINICAL DECISION MAKING: Evolving/moderate complexity  EVALUATION COMPLEXITY: Moderate    GOALS: Short term PT Goals Target date: 03/06/24 Pt will be I and compliant with HEP. Baseline:  Goal status: New Pt will decrease pain by 25% overall Baseline: Goal status: New  Long term PT goals Target date: 04/24/2024 Pt will improve ROM to [facility] to improve functional  mobility Baseline: Goal status: New Pt will improve hip/knee strength to at least 5-/5 MMT to improve functional strength Baseline: Goal status: New Pt will improve OSWESTRY to at least % functional to show improved function Baseline: Goal status: New Pt will reduce pain by overall 50% overall with usual activity Baseline: Goal status: New Pt will reduce  pain to overall less than 3-4/10 with usual activity and work activity. Baseline: Goal status: New Pt will be able to ambulate on uneven surfaces at least 500 ft WNL gait pattern without complaints Baseline: Goal status: New   7. Pt will be able to perform SLS for 30 seconds on each leg without LOB.  Baseline:   Goal status: New  PLAN: PT FREQUENCY: 1-3 times per week   PT DURATION: 6-8 weeks  PLANNED INTERVENTIONS (unless contraindicated): aquatic PT, Canalith repositioning, cryotherapy, Electrical stimulation, Iontophoresis with 4 mg/ml dexamethasome, Moist heat, traction, Ultrasound, gait training, Therapeutic exercise, balance training, neuromuscular re-education, patient/family education, prosthetic training, manual techniques, passive ROM, dry needling, taping, vasopnuematic device, vestibular, spinal manipulations, joint manipulations  PLAN FOR NEXT SESSION: Assess HEP/update PRN, continue to progress functional mobility, work on balance, work on designer, jewellery.  Core control.   Dale Call PT, DPT 03/13/24 3:32 PM

## 2024-03-14 ENCOUNTER — Other Ambulatory Visit: Payer: Self-pay

## 2024-03-14 ENCOUNTER — Ambulatory Visit: Payer: Self-pay

## 2024-03-14 ENCOUNTER — Other Ambulatory Visit: Payer: Self-pay | Admitting: Cardiology

## 2024-03-14 ENCOUNTER — Other Ambulatory Visit (HOSPITAL_BASED_OUTPATIENT_CLINIC_OR_DEPARTMENT_OTHER): Payer: Self-pay

## 2024-03-14 ENCOUNTER — Ambulatory Visit: Admitting: Family Medicine

## 2024-03-14 VITALS — BP 115/64 | HR 71 | Ht 70.0 in | Wt 181.1 lb

## 2024-03-14 DIAGNOSIS — T8090XA Unspecified complication following infusion and therapeutic injection, initial encounter: Secondary | ICD-10-CM | POA: Diagnosis not present

## 2024-03-14 MED ORDER — CLOPIDOGREL BISULFATE 75 MG PO TABS
75.0000 mg | ORAL_TABLET | Freq: Every day | ORAL | 2 refills | Status: AC
  Filled 2024-03-14: qty 90, 90d supply, fill #0

## 2024-03-14 NOTE — Progress Notes (Signed)
 Pt got the RSV vaccine done yesterday in [place] he said that he immediately felt pain at the injection site.

## 2024-03-14 NOTE — Progress Notes (Signed)
 Reginald Rios - 68 y.o. male MRN [id number]  Date of birth: 02/22/1956  Subjective Chief Complaint  Patient presents with   immunization reaction    HPI Reginald Rios is a 68 y.o. male here today with complaint of redness and swelling at site of RSV vaccine injection.  He had injection yesterday.  He had more pain than usual with injection.   Noticed redness and swelling yesterday evening.  Woke up with swelling in the L hand this morning.  Hot shower seemed to help this morning. Stiffness in hand has improved some as the day has gone on.  He denies fever, chills, or other symptoms at this time.   ROS:  A comprehensive ROS was completed and negative except as noted per HPI  Allergies  Allergen Reactions   Invokana  [Canagliflozin ] Rash   Grass Pollen(K-O-R-T-Swt Vern)    Lisinopril  Cough   Semaglutide  Nausea Only    Causes cramping. 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 No issues now   Hemarthrosis of right elbow 02/03/2016   Hoarseness 04/13/2019   Hyperlipidemia    family hx of high cholesterol   Hypertension    Hypogonadism in male    Incomplete rotator cuff tear 09/10/2015   Injected 09/10/2015   Lateral epicondylitis of right elbow 07/28/2019   Low back pain at multiple sites 02/05/2021   Low vitamin D  level 04/22/2017   Mild persistent asthma/cough variant asthma 04/13/2019   Myofascial pain syndrome, cervical 07/28/2019   Neck pain 06/01/2018   Nonischemic cardiomyopathy (HCC) 05/15/2015   Nontraumatic incomplete tear of right rotator cuff 07/25/2019   Occipital headache 06/01/2018   Onychomycosis 05/25/2018   Osteoarthritis    Pain in right ankle and joints of right foot 10/05/2016   Palpitations 07/06/2017   Pernicious anemia    PONV (postoperative nausea and vomiting)    Preop cardiovascular exam 10/18/2023   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   Shoulder bursitis 10/12/2014   Injected in 10/12/2014  Repeat 01/21/2015   Small thenar eminence 02/03/2016   Spinal stenosis, lumbar region, without neurogenic claudication 10/12/2014   Spondylolisthesis at L5-S1 level 05/09/2021   Strain of latissimus dorsi muscle 01/21/2015   Tendinopathy of right biceps tendon 07/25/2019   Type 2 diabetes mellitus with hyperglycemia, without long-term current use of insulin  (HCC) 12/22/2013   Ulnar neuropathy at elbow of right upper extremity 01/03/2019    Past Surgical History:  Procedure Laterality Date   CATARACT EXTRACTION W/ INTRAOCULAR LENS  IMPLANT Bilateral 2023   COLONOSCOPY     CORONARY PRESSURE/FFR STUDY N/A 06/23/2017   Procedure: INTRAVASCULAR PRESSURE WIRE/FFR STUDY;  Surgeon: Anner Alm ORN, MD;  Location: [facility] INVASIVE CV LAB;  Service: Cardiovascular;  Laterality: N/A;   CORONARY STENT INTERVENTION N/A 06/23/2017   Procedure: CORONARY STENT INTERVENTION;  Surgeon: Anner Alm ORN, MD;  Location: [facility] INVASIVE CV LAB;  Service:  Cardiovascular;  Laterality: N/A;   KNEE ARTHROSCOPY Left    LEFT HEART CATH AND CORONARY ANGIOGRAPHY N/A 06/23/2017   Procedure: LEFT HEART CATH AND CORONARY ANGIOGRAPHY;  Surgeon: Anner Alm ORN, MD;  Location: [facility] INVASIVE CV LAB;  Service: Cardiovascular;  Laterality: N/A;   LEFT HEART CATH AND CORONARY ANGIOGRAPHY N/A 07/14/2023   Procedure: LEFT HEART CATH AND CORONARY ANGIOGRAPHY;  Surgeon: Elmira Newman PARAS, MD;  Location: [facility] INVASIVE CV LAB;  Service: Cardiovascular;  Laterality: N/A;   SHOULDER ARTHROSCOPY WITH BICEPSTENOTOMY Right 09/06/2019   Procedure: SHOULDER ARTHROSCOPY WITH BICEPSTENOTOMY;  Surgeon: Jerri Kay HERO, MD;  Location: [facility];  Service: Orthopedics;  Laterality: Right;   SHOULDER ARTHROSCOPY WITH SUBACROMIAL DECOMPRESSION Right 09/06/2019   Procedure: RIGHT SHOULDER ARTHROSCOPY WITH EXTENSIVE DEBRIDEMENT, SUBACROMIAL DECOMPRESSION, BICEPS TENOTOMY;  Surgeon: Jerri Kay HERO, MD;  Location: [facility];  Service: Orthopedics;  Laterality: Right;   TENOTOMY ACHILLES TENDON  2007   In [place], [place]   ULNAR NERVE TRANSPOSITION  2006   In [place], [place]    Social History   Socioeconomic History   Marital status: Married    Spouse name: Not on file   Number of children: Not on file   Years of education: Not on file   Highest education level: Some college, no degree  Occupational History   Occupation: conservation officer, historic buildings   Tobacco Use   Smoking status: Never    Passive exposure: Never   Smokeless tobacco: Never  Vaping Use   Vaping status: Never Used  Substance and Sexual Activity   Alcohol use: Not Currently    Comment: 1-2 drinks/monthly   Drug use: No   Sexual activity: Yes  Other Topics Concern   Not on file  Social History Narrative   Previously worked as environmental manager   Currently working at [employer]   Married 30 years   2 children ages 90, 30   Wife is psychiatrist - Dr. Adrien Gull   Moved from [place]    Grew up in [place]   Social Drivers of Health   Financial Resource Strain: Low Risk  (03/14/2024)   Overall Financial Resource Strain (CARDIA)    Difficulty of Paying Living Expenses: Not hard at all  Food Insecurity: No Food Insecurity (03/14/2024)   Hunger Vital Sign    Worried About Running Out of Food in the Last Year: Never true    Ran Out of Food in the Last Year: Never true  Transportation Needs: No Transportation Needs (03/14/2024)   PRAPARE - Administrator, Civil Service (Medical): No    Lack of Transportation (Non-Medical): No  Physical Activity: Insufficiently Active (03/14/2024)   Exercise Vital Sign    Days of Exercise per Week: 7 days    Minutes of Exercise per Session: 20 min  Stress: No Stress Concern Present (03/14/2024)   [employer] of Occupational Health - Occupational Stress Questionnaire    Feeling of Stress: Not at all  Social Connections: Moderately Integrated (03/14/2024)   Social Connection and Isolation Panel    Frequency of Communication with Friends  and Family: More than three times a week    Frequency of Social Gatherings with Friends and Family: Three times a week    Attends Religious Services: Never    Active Member of Clubs or Organizations: Yes    Attends Banker Meetings: 1 to 4 times per year    Marital Status: Married  Recent Concern: Social Connections - Moderately Isolated (12/17/2023)   Social Connection and Isolation Panel    Frequency of Communication with Friends and Family: More than three times a week    Frequency of Social Gatherings with Friends and Family: Three times a week    Attends Religious Services: Never    Active Member of Clubs or Organizations: No    Attends Engineer, Structural: Not on file    Marital Status: Married    Family History  Problem Relation Age of Onset   Lung cancer Mother 12   Cancer Mother        lung cancer   Coronary artery disease Father 46   Heart  disease Father    Hypothyroidism Brother    Food Allergy Son    Asthma Paternal Aunt    Colon cancer Neg Hx    Prostate cancer Neg Hx    Allergic rhinitis Neg Hx    Angioedema Neg Hx    Eczema Neg Hx    Immunodeficiency Neg Hx    Urticaria Neg Hx     Health Maintenance  Topic Date Due   Colonoscopy  03/21/2024   Zoster Vaccines- Shingrix (1 of 2) 08/08/2024 (Originally 05/19/2003)   HEMOGLOBIN A1C  07/10/2024   Diabetic kidney evaluation - Urine ACR  08/08/2024   COVID-19 Vaccine (9 - 2025-26 season) 08/08/2024   OPHTHALMOLOGY EXAM  11/24/2024   Diabetic kidney evaluation - eGFR measurement  02/07/2025   FOOT EXAM  02/07/2025   DTaP/Tdap/Td (2 - Td or Tdap) 05/25/2028   Pneumococcal Vaccine: 50+ Years  Completed   Influenza Vaccine  Completed   Hepatitis C Screening  Completed   Meningococcal B Vaccine  Aged Out     ----------------------------------------------------------------------------------------------------------------------------------------------------------------------------------------------------------------- Physical Exam BP 115/64   Pulse 71   Ht 5' 10 (1.778 m)   Wt 181 lb 1.9 oz (82.2 kg)   SpO2 98%   BMI 25.99 kg/m   Physical Exam Constitutional:      Appearance: Normal appearance.  HENT:     Head: Normocephalic and atraumatic.  Cardiovascular:     Rate and Rhythm: Normal rate and regular rhythm.  Pulmonary:     Effort: Pulmonary effort is normal.     Breath sounds: Normal breath sounds.  Musculoskeletal:     Comments: TTP over L deltoid, mild redness and warmth around injection site.  Mild swelling over L hand.    Neurological:     Mental Status: He is alert.     ------------------------------------------------------------------------------------------------------------------------------------------------------------------------------------------------------------------- Assessment and Plan  Injection site reaction Recommend applying  to ice to sore areas.  May use topical voltaren  as needed.  Keep hand and arm elevated.  Red flags reviewed.  Contact clinic if continues to worsen.    No orders of the defined types were placed in this encounter.   No follow-ups on file.

## 2024-03-14 NOTE — Patient Instructions (Signed)
 Keep hand and arm elevated.  Try voltaren  gel.  Apply ice to hand and shoulder as needed.  Contact clinic if continuing to worsen, you develop fever or chills

## 2024-03-14 NOTE — Telephone Encounter (Signed)
 FYI Only or Action Required?: Action required by provider: request for appointment.  Patient was last seen in primary care on 02/11/2024 by Frann Mabel Mt, DO.  Called Nurse Triage reporting Immunization reaction.  Symptoms began yesterday.  Interventions attempted: Nothing.  Symptoms are: unchanged.RSV vaccine yesterday and has severe pain at site, awake last night.  Triage Disposition: See Physician Within 24 Hours  Patient/caregiver understands and will follow disposition?: Yes     Copied from CRM #[id number]. Topic: Clinical - Red Word Triage >> Mar 14, 2024  8:19 AM Burnard DEL wrote: Red Word that prompted transfer to Nurse Triage: extreme pain in sight of rvs vaccine Reason for Disposition  Fever present > 3 days (72 hours)  Answer Assessment - Initial Assessment Questions 1. SYMPTOMS: What is the main symptom? (e.g., pain, redness, or swelling at injection site; feeling tired, fever, muscle aches)      Hand is swollen 2. ONSET: When was the vaccine (shot) given? How much later did the yesterday begin? (e.g., hours, days ago)      RSV 3. SEVERITY: How bad is it?      severe 4. FEVER: Do you have a fever? If Yes, ask: What is your temperature, how was it measured, and when did it start?      no 5. IMMUNIZATIONS GIVEN: What shots have you recently received?     RSV 6. PAST REACTIONS: Have you reacted to immunizations before? If Yes, ask: What happened?     NO 7. OTHER SYMPTOMS: Do you have any other symptoms?     NO 8. PREGNANCY: Is there any chance you are pregnant? When was your last menstrual period?     N/A  Protocols used: Immunization Reactions-A-AH

## 2024-03-14 NOTE — Assessment & Plan Note (Signed)
 Recommend applying to ice to sore areas.  May use topical voltaren  as needed.  Keep hand and arm elevated.  Red flags reviewed.  Contact clinic if continues to worsen.

## 2024-03-15 ENCOUNTER — Other Ambulatory Visit: Payer: Self-pay

## 2024-03-15 ENCOUNTER — Other Ambulatory Visit (HOSPITAL_BASED_OUTPATIENT_CLINIC_OR_DEPARTMENT_OTHER): Payer: Self-pay

## 2024-03-15 DIAGNOSIS — Z6825 Body mass index (BMI) 25.0-25.9, adult: Secondary | ICD-10-CM | POA: Diagnosis not present

## 2024-03-15 DIAGNOSIS — M4317 Spondylolisthesis, lumbosacral region: Secondary | ICD-10-CM | POA: Diagnosis not present

## 2024-03-15 MED ORDER — BACLOFEN 10 MG PO TABS
10.0000 mg | ORAL_TABLET | Freq: Four times a day (QID) | ORAL | 4 refills | Status: AC
  Filled 2024-03-15: qty 60, 15d supply, fill #0
  Filled 2024-04-06: qty 60, 15d supply, fill #1

## 2024-03-15 MED ORDER — LIDOCAINE 5 % EX PTCH
MEDICATED_PATCH | CUTANEOUS | 6 refills | Status: AC
  Filled 2024-03-15: qty 30, 30d supply, fill #0

## 2024-03-15 MED ORDER — GABAPENTIN 100 MG PO CAPS
100.0000 mg | ORAL_CAPSULE | Freq: Three times a day (TID) | ORAL | 3 refills | Status: AC | PRN
  Filled 2024-03-15: qty 90, 30d supply, fill #0
  Filled 2024-04-28: qty 90, 30d supply, fill #1
  Filled 2024-06-07: qty 90, 30d supply, fill #2

## 2024-03-20 ENCOUNTER — Ambulatory Visit (HOSPITAL_BASED_OUTPATIENT_CLINIC_OR_DEPARTMENT_OTHER): Admitting: Physical Therapy

## 2024-03-20 ENCOUNTER — Encounter (HOSPITAL_BASED_OUTPATIENT_CLINIC_OR_DEPARTMENT_OTHER): Payer: Self-pay | Admitting: Physical Therapy

## 2024-03-20 ENCOUNTER — Other Ambulatory Visit (HOSPITAL_BASED_OUTPATIENT_CLINIC_OR_DEPARTMENT_OTHER): Payer: Self-pay

## 2024-03-20 DIAGNOSIS — R29898 Other symptoms and signs involving the musculoskeletal system: Secondary | ICD-10-CM

## 2024-03-20 DIAGNOSIS — G8929 Other chronic pain: Secondary | ICD-10-CM

## 2024-03-20 DIAGNOSIS — M5441 Lumbago with sciatica, right side: Secondary | ICD-10-CM | POA: Diagnosis not present

## 2024-03-20 DIAGNOSIS — M6281 Muscle weakness (generalized): Secondary | ICD-10-CM | POA: Diagnosis not present

## 2024-03-20 NOTE — Therapy (Signed)
 OUTPATIENT PHYSICAL THERAPY THORACOLUMBAR TREATMENT   Patient Name: Reginald Rios MRN: [id number] DOB:11-22-1955, 68 y.o., male Today's Date: 03/20/2024  END OF SESSION:     PT End of Session - 03/20/24 1309     Visit Number 6    Number of Visits --    Date for Recertification  04/10/24    Authorization Type [employer]    Progress Note Due on Visit 10    PT Start Time 1300    PT Stop Time 1339    PT Time Calculation (min) 39 min    Behavior During Therapy [facility] for tasks assessed/performed          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   BPPV (benign paroxysmal positional vertigo) 08/14/2014   CAD (coronary artery disease)    2/19 PCI/DESx1 to Lcx   Cervical disc disorder with radiculopathy of cervical region 10/07/2015   Cervical radicular pain 01/10/2019   Chronic cough 04/13/2019   Decreased cardiac ejection fraction 04/02/2015   Diabetes mellitus without complication (HCC)    Dyspnea on exertion 05/15/2015   Erectile dysfunction 07/11/2019   Exercise-induced asthma    Family history of early CAD 07/05/2017   Fatigue 05/15/2015   Hallux rigidus, right foot 09/15/2021   Heart murmur    In early childhood. No issues now   Hemarthrosis of right elbow 02/03/2016   Hoarseness 04/13/2019   Hyperlipidemia    family hx of high cholesterol   Hypertension    Hypogonadism in male    Incomplete rotator cuff tear 09/10/2015   Injected 09/10/2015   Lateral epicondylitis of right elbow 07/28/2019   Low back pain at multiple sites 02/05/2021   Low vitamin D  level  04/22/2017   Mild persistent asthma/cough variant asthma 04/13/2019   Myofascial pain syndrome, cervical 07/28/2019   Neck pain 06/01/2018   Nonischemic cardiomyopathy (HCC) 05/15/2015   Nontraumatic incomplete tear of right rotator cuff 07/25/2019   Occipital headache 06/01/2018   Onychomycosis 05/25/2018   Osteoarthritis    Pain in right ankle and joints of right foot 10/05/2016   Palpitations 07/06/2017   Pernicious anemia    PONV (postoperative nausea and vomiting)    Preop cardiovascular exam 10/18/2023   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   Shoulder bursitis 10/12/2014   Injected in 10/12/2014  Repeat 01/21/2015   Small thenar eminence 02/03/2016   Spinal stenosis, lumbar region, without neurogenic claudication 10/12/2014   Spondylolisthesis at L5-S1 level 05/09/2021   Strain of latissimus dorsi muscle 01/21/2015   Tendinopathy of right biceps tendon 07/25/2019   Type 2 diabetes mellitus with hyperglycemia, without long-term current use of insulin  (HCC) 12/22/2013   Ulnar neuropathy at elbow of right upper extremity 01/03/2019   Past Surgical History:  Procedure Laterality Date   CATARACT EXTRACTION W/ INTRAOCULAR LENS IMPLANT Bilateral 2023   COLONOSCOPY     CORONARY PRESSURE/FFR STUDY N/A 06/23/2017   Procedure: INTRAVASCULAR PRESSURE WIRE/FFR STUDY;  Surgeon: Anner Alm ORN, MD;  Location: [facility] INVASIVE CV LAB;  Service: Cardiovascular;  Laterality: N/A;   CORONARY STENT INTERVENTION N/A 06/23/2017   Procedure: CORONARY STENT INTERVENTION;  Surgeon: Anner Alm ORN, MD;  Location: [facility] INVASIVE CV LAB;  Service: Cardiovascular;  Laterality: N/A;   KNEE ARTHROSCOPY Left    LEFT HEART CATH AND CORONARY ANGIOGRAPHY N/A 06/23/2017   Procedure: LEFT HEART CATH AND CORONARY ANGIOGRAPHY;  Surgeon: Anner Alm ORN, MD;  Location: [facility] INVASIVE CV LAB;  Service: Cardiovascular;  Laterality: N/A;   LEFT HEART CATH AND CORONARY ANGIOGRAPHY N/A 07/14/2023   Procedure: LEFT HEART CATH AND  CORONARY ANGIOGRAPHY;  Surgeon: Elmira Newman PARAS, MD;  Location: [facility] INVASIVE CV LAB;  Service: Cardiovascular;  Laterality: N/A;   SHOULDER ARTHROSCOPY WITH BICEPSTENOTOMY Right 09/06/2019   Procedure: SHOULDER ARTHROSCOPY WITH BICEPSTENOTOMY;  Surgeon: Jerri Kay HERO, MD;  Location: [facility];  Service: Orthopedics;  Laterality: Right;   SHOULDER ARTHROSCOPY WITH SUBACROMIAL DECOMPRESSION Right 09/06/2019   Procedure: RIGHT SHOULDER ARTHROSCOPY WITH EXTENSIVE DEBRIDEMENT, SUBACROMIAL DECOMPRESSION, BICEPS TENOTOMY;  Surgeon: Jerri Kay HERO, MD;  Location: [facility];  Service: Orthopedics;  Laterality: Right;   TENOTOMY ACHILLES TENDON  2007   In [place], [place]   ULNAR NERVE TRANSPOSITION  2006   In [place], [place]   Patient Active Problem List   Diagnosis Date Noted   Injection site reaction 03/14/2024   Hypertension    PONV (postoperative nausea and vomiting)    Preop cardiovascular exam 10/18/2023   Pernicious anemia    Hallux rigidus, right foot 09/15/2021   Spondylolisthesis at L5-S1 level 05/09/2021   Low back pain at multiple sites 02/05/2021   Osteoarthritis    Heart murmur    Exercise-induced asthma    Diabetes mellitus without complication (HCC)    Arthritis    Anemia    Myofascial pain syndrome, cervical 07/28/2019   Lateral epicondylitis of right elbow 07/28/2019   Nontraumatic incomplete tear of right rotator cuff 07/25/2019   Tendinopathy of right biceps tendon 07/25/2019   Erectile dysfunction 07/11/2019   Mild persistent asthma/cough variant asthma 04/13/2019   Allergic rhinitis with a possible nonallergic component 04/13/2019   Acid reflux 04/13/2019   Chronic cough 04/13/2019   Hoarseness 04/13/2019   Cervical radicular pain 01/10/2019   Ulnar neuropathy at elbow of right upper extremity 01/03/2019   Acute bilateral low back pain without sciatica 01/03/2019   Neck pain 06/01/2018   Occipital headache 06/01/2018    Onychomycosis 05/25/2018   Palpitations 07/06/2017   CAD (coronary artery disease) 07/05/2017   Family history of early CAD 07/05/2017   Abnormal findings on diagnostic imaging of cardiovascular system 06/23/2017   Low vitamin D  level 04/22/2017   Pain in right ankle and joints of right foot 10/05/2016   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   Hemarthrosis of right elbow 02/03/2016   Small thenar eminence 02/03/2016   Cervical disc disorder with radiculopathy of cervical region 10/07/2015   Incomplete rotator cuff tear 09/10/2015   Nonischemic cardiomyopathy (HCC) 05/15/2015   Fatigue 05/15/2015   Dyspnea on exertion 05/15/2015   Decreased cardiac ejection fraction 04/02/2015   Strain of latissimus dorsi muscle 01/21/2015   Shoulder bursitis 10/12/2014   Spinal stenosis, lumbar region, without neurogenic claudication 10/12/2014   BPPV (benign paroxysmal positional vertigo) 08/14/2014   Hyperlipidemia 01/19/2014   Anemia, unspecified 01/19/2014   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   Type 2 diabetes mellitus with hyperglycemia, without long-term current use of insulin  (HCC) 12/22/2013   Hypogonadism in male 12/22/2013   Arthritis of hand, left 12/22/2013    PCP: Frann Mabel Mt, DO  REFERRING PROVIDER: Colon Shove, MD  REFERRING DIAG: Spondylolisthesis, lumbosacral region [M43.17]   Rationale for Evaluation and Treatment: Rehabilitation  THERAPY DIAG:  Muscle weakness (generalized)  Chronic bilateral low back pain with right-sided sciatica  Other symptoms and signs involving the musculoskeletal system  ONSET DATE: 10/25/23 surgery date.  SUBJECTIVE:                                                                                                                                                                                           SUBJECTIVE STATEMENT: Pt reports that he has been using TENS, and now the pain is better. I feel pretty  good.       Eval: Pt was playing pickleball at [employer] in January 2025l. He reports having pain following game and was unable to walk. Started having legs give way. Pain has been consistent prior/ after surgery, but no longer shooting pain. He also noted numbness in bilat LE's into feet. He states that numbness is new. My doctor says  I cannot do anymore damage at this point, He also said not to twist, I dont twist anymore as it flares everything up. I have tried walking on the beach and on uneven surfaces and it continues to cause a lot of pain and difficulty.   PERTINENT HISTORY:  Cardiac, Acid reflux, Anemia, Arthritis, ADD, BPPV, CAD, DM, Dyspnea on exertion, Osteoarthritis.     PAIN:  Are you having pain? Yes: NPRS scale: 3/10 Pain location: bil lower back.  Pain description: Achy pain, sharp pain Aggravating factors: twisting, walking on uneven surfaces.  Relieving factors: Resting.   PRECAUTIONS: None  RED FLAGS: None   WEIGHT BEARING RESTRICTIONS: No  FALLS:  Has patient fallen in last 6 months? No  LIVING ENVIRONMENT: Lives with: lives with their family Lives in: House/apartment Stairs: 24 stairs internally, 3 externally.  Has following equipment at home: None  OCCUPATION: Retired   PLOF: Independent  PATIENT GOALS: Pt would like to get back to walking.   NEXT MD VISIT: None currently.   OBJECTIVE:  Note: Objective measures were completed at Evaluation unless otherwise noted.  DIAGNOSTIC FINDINGS:  1. Status post L4-S1 discectomy and fusion without evidence of complication. The central canal and foramina appear widely patent at the surgical levels. 2. Mild to moderate central canal narrowing at L2-3 and L3-4. 3. Aortic atherosclerosis.   Aortic Atherosclerosis (ICD10-I70.0).  PATIENT SURVEYS:  Oswestry: 21/50  COGNITION: Overall cognitive status: Within functional limits for tasks assessed     SENSATION: WFL   POSTURE: rounded shoulders and  forward head  PALPATION: Tenderness across lower back.   LUMBAR ROM:   AROM eval  Flexion  15%* hip flexion mostly.   Extension 10%   Right lateral flexion Pt denies movement due to pain  Left lateral flexion Pt denies movement due to pain  Right rotation Pt denies movement due to pain  Left rotation Pt denies movement due to pain   (Blank rows = not tested)  LOWER EXTREMITY ROM:     Active  Right eval Left eval  Hip flexion WFL with reports of pain in lower back  [facility] with reports of pain in lower back   Hip extension    Hip abduction    Hip adduction    Hip internal rotation    Hip external rotation [facility]  Knee flexion [facility]   Knee extension WFL with reports of pain in lower back [facility] with reports of pain in lower back   (Blank rows = not tested)  LOWER EXTREMITY MMT:    MMT Right eval Left eval  Hip flexion    Hip extension    Hip abduction    Hip adduction    Hip internal rotation    Hip external rotation 4 4  Knee flexion    Knee extension     (Blank rows = not tested)   FUNCTIONAL TESTS:  5 times sit to stand: 26.22 seconds.   GAIT: Distance walked: 84ft   Assistive device utilized: None Level of assistance: Complete Independence Comments: Pt walks with a slow antalgic gait. Minimal rotation.   TREATMENT DATE:  [facility] Adult PT Treatment:                                             Date: 11/17 Pt seen for aquatic therapy today.  Treatment took place in water 3.5-4.75 ft in depth at the [facility] pool. Temp of water was 91.  Pt entered/exited the pool via stairs independently with bil rail.  - unsupported walking forward/ backward and side stepping-> marching forward / backward  - side stepping with arm add/abdct with yellow hand floats  - UE On wall: squats x 10 (vertical trunk, allowing heels to rise) -> pushing single rainbow hand float under water -> yellow hand floats  - UE on yellow hand floats:   toe/heel raises x10; hip add/abd  x10;   - UE on wall: leg swings into hip flexion/extension to toe touch x 10 each (cues to not swing too high);  forward lunge x 10 each side   - UE yellow hand floats:lunges forward on diagonal and return to neutral x 5 each way;  marching with reciprocal row -forward/ backward  - straddling noodle with UE support on corner: cycling  - R/L hamstring stretch at stairs 15sec x 2 each LE   11/10 STM bilat lumbar paraspinals  LTR 2x10 (half turns, pushing through legs to simulate rolling in bed)  Pain neuro-science, desensitization techniques, progressive pool exercise,   [facility] Adult PT Treatment:                                             Date: 11/7 Pt seen for aquatic therapy today.  Treatment took place in water 3.5-4.75 ft in depth at the [facility] pool. Temp of water was 91.  Pt entered/exited the pool via stairs independently with bil  rail.  - Intro to aquatic therapy principles - unsupported -> UE on walking forward/ backward -> marching forward / backward with row motion  - side stepping with arm horz add/abdct with yellow hand floats  - UE on wall:  toe/heel raises x10; hip add/abd 2x10 ; hip flexion /extension x8 (pain with hip extension); relaxed squats x10 - TrA set with hollow noodle pull down to thighs x 10  - straddling noodle with UE support on corner: cycling  - STS from 3rd step with forward arm reach x 4  Pt requires the buoyancy and hydrostatic pressure of water for support, and to offload joints by unweighting joint load by at least 50 % in navel deep water and by at least 75-80% in chest to neck deep water.  Viscosity of the water is needed for resistance of strengthening. Water current perturbations provides challenge to standing balance requiring increased core activation.     11/3 STM bilat lumbar paraspinals   LTR 10x PPT 2x10 Seated pelvic tilting A and P 20x Curl up 10x 2s  Anatomy of condition, tissue tolerance and loading tolerance, functional  capacity and envelope of function    02/28/24 - Nustep lvl 5, 5 min - LTR to tolerance  - Bridges x15  - SLR 2 x10 - figure 4 stretch  - Supine ball squeezes, 5 sec holds.  - Sideling clams  - LAQ  - HS curl with GTB  - Seated HS stretch     02/21/24                                                                                                                              - Education on self care management.  - Ball squeezes in supine - LTR to tolerance.    PATIENT EDUCATION:  Education details: Educated pt on anatomy and physiology of current symptoms, intro to aquatic therapy, posture and body mechanics Person educated: Patient Education method: Explanation, Demonstration, and Handouts Education comprehension: verbalized understanding and returned demonstration  HOME EXERCISE PROGRAM: Access Code: [email or web address] URL: [email or web address] Date: 02/21/2024 Prepared by: Rojean Batten  Exercises - Supine Lower Trunk Rotation  - 1-2 x daily - 7 x weekly - 2-3 sets - 10 reps - Supine Hip Adduction Isometric with Ball  - 1-2 x daily - 7 x weekly - 2-3 sets - 10 reps   ASSESSMENT:  CLINICAL IMPRESSION:  Pt with improved tolerance to aquatic therapy exercises. Initially reporting stiffness, eased with time submersion.  Pt required frequent cues to dial back speed and height of LEs as to not over-exert. Pain remained very low during session.  Encouraged self care afterwards.  Pt will continue to benefit from skilled PT to address continued deficits. Therapist to check STG next visit.    Eval: Patient referred to PT for chronic lower back pain. Pt has fear avoidance and does not perform lumbar flexion or rotation per subjective reports. He has been through extensive  PT, but failed. Received surgery in June 2025 for posterior lumbar interbody fusion of L5 and S1. Patient will benefit from skilled PT to address below impairments, limitations and improve overall  function.  OBJECTIVE IMPAIRMENTS: decreased activity tolerance, difficulty walking, decreased balance, decreased endurance, decreased mobility, decreased ROM, decreased strength, impaired flexibility, impaired UE/LE use, postural dysfunction, and pain.  ACTIVITY LIMITATIONS: bending, lifting, carry, locomotion, cleaning, community activity, driving, and or occupation  PERSONAL FACTORS: Cardiac, Acid reflux, Anemia, Arthritis, ADD, BPPV, CAD, DM, Dyspnea on exertion, Osteoarthritis.  are also affecting patient's functional outcome.  REHAB POTENTIAL: Good  CLINICAL DECISION MAKING: Evolving/moderate complexity  EVALUATION COMPLEXITY: Moderate    GOALS: Short term PT Goals Target date: 03/06/24 Pt will be I and compliant with HEP. Baseline:  Goal status: New Pt will decrease pain by 25% overall Baseline: Goal status: New  Long term PT goals Target date: 04/24/2024 Pt will improve ROM to [facility] to improve functional mobility Baseline: Goal status: New Pt will improve hip/knee strength to at least 5-/5 MMT to improve functional strength Baseline: Goal status: New Pt will improve OSWESTRY to at least % functional to show improved function Baseline: Goal status: New Pt will reduce pain by overall 50% overall with usual activity Baseline: Goal status: New Pt will reduce pain to overall less than 3-4/10 with usual activity and work activity. Baseline: Goal status: New Pt will be able to ambulate on uneven surfaces at least 500 ft WNL gait pattern without complaints Baseline: Goal status: New   7. Pt will be able to perform SLS for 30 seconds on each leg without LOB.  Baseline:   Goal status: New  PLAN: PT FREQUENCY: 1-3 times per week   PT DURATION: 6-8 weeks  PLANNED INTERVENTIONS (unless contraindicated): aquatic PT, Canalith repositioning, cryotherapy, Electrical stimulation, Iontophoresis with 4 mg/ml dexamethasome, Moist heat, traction, Ultrasound, gait training,  Therapeutic exercise, balance training, neuromuscular re-education, patient/family education, prosthetic training, manual techniques, passive ROM, dry needling, taping, vasopnuematic device, vestibular, spinal manipulations, joint manipulations  PLAN FOR NEXT SESSION: Assess HEP/update PRN, continue to progress functional mobility, work on balance, work on designer, jewellery.  Core control.   Delon Aquas, PTA 03/20/24 1:48 PM [facility] [address] [place], [place], [address] Phone: [phone]   Fax:  [phone]

## 2024-03-22 ENCOUNTER — Ambulatory Visit (HOSPITAL_BASED_OUTPATIENT_CLINIC_OR_DEPARTMENT_OTHER): Payer: Self-pay | Admitting: Physical Therapy

## 2024-03-24 ENCOUNTER — Encounter (HOSPITAL_BASED_OUTPATIENT_CLINIC_OR_DEPARTMENT_OTHER): Payer: Self-pay | Admitting: Physical Therapy

## 2024-03-24 ENCOUNTER — Ambulatory Visit (HOSPITAL_BASED_OUTPATIENT_CLINIC_OR_DEPARTMENT_OTHER): Admitting: Physical Therapy

## 2024-03-24 DIAGNOSIS — M6281 Muscle weakness (generalized): Secondary | ICD-10-CM | POA: Diagnosis not present

## 2024-03-24 DIAGNOSIS — G8929 Other chronic pain: Secondary | ICD-10-CM

## 2024-03-24 DIAGNOSIS — M5441 Lumbago with sciatica, right side: Secondary | ICD-10-CM | POA: Diagnosis not present

## 2024-03-24 DIAGNOSIS — R29898 Other symptoms and signs involving the musculoskeletal system: Secondary | ICD-10-CM

## 2024-03-24 NOTE — Therapy (Signed)
 OUTPATIENT PHYSICAL THERAPY THORACOLUMBAR TREATMENT   Patient Name: Reginald Rios MRN: [id number] DOB:01-21-1956, 68 y.o., male Today's Date: 03/24/2024  END OF SESSION:     PT End of Session - 03/24/24 1148     Visit Number 7    Date for Recertification  04/10/24    Authorization Type [employer]    Progress Note Due on Visit 10    PT Start Time 1101    PT Stop Time 1143    PT Time Calculation (min) 42 min    Activity Tolerance Patient tolerated treatment well    Behavior During Therapy [facility] for tasks assessed/performed           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   BPPV (benign paroxysmal positional vertigo) 08/14/2014   CAD (coronary artery disease)    2/19 PCI/DESx1 to Lcx   Cervical disc disorder with radiculopathy of cervical region 10/07/2015   Cervical radicular pain 01/10/2019   Chronic cough 04/13/2019   Decreased cardiac ejection fraction 04/02/2015   Diabetes mellitus without complication (HCC)    Dyspnea on exertion 05/15/2015   Erectile dysfunction 07/11/2019   Exercise-induced asthma    Family history of early CAD 07/05/2017   Fatigue 05/15/2015   Hallux rigidus, right foot 09/15/2021   Heart murmur    In early childhood. No issues now   Hemarthrosis of right elbow 02/03/2016   Hoarseness 04/13/2019   Hyperlipidemia    family hx of high cholesterol   Hypertension    Hypogonadism in male    Incomplete rotator cuff tear 09/10/2015   Injected 09/10/2015   Lateral epicondylitis of right elbow 07/28/2019   Low back pain at multiple sites  02/05/2021   Low vitamin D  level 04/22/2017   Mild persistent asthma/cough variant asthma 04/13/2019   Myofascial pain syndrome, cervical 07/28/2019   Neck pain 06/01/2018   Nonischemic cardiomyopathy (HCC) 05/15/2015   Nontraumatic incomplete tear of right rotator cuff 07/25/2019   Occipital headache 06/01/2018   Onychomycosis 05/25/2018   Osteoarthritis    Pain in right ankle and joints of right foot 10/05/2016   Palpitations 07/06/2017   Pernicious anemia    PONV (postoperative nausea and vomiting)    Preop cardiovascular exam 10/18/2023   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   Shoulder bursitis 10/12/2014   Injected in 10/12/2014  Repeat 01/21/2015   Small thenar eminence 02/03/2016   Spinal stenosis, lumbar region, without neurogenic claudication 10/12/2014   Spondylolisthesis at L5-S1 level 05/09/2021   Strain of latissimus dorsi muscle 01/21/2015   Tendinopathy of right biceps tendon 07/25/2019   Type 2 diabetes mellitus with hyperglycemia, without long-term current use of insulin  (HCC) 12/22/2013   Ulnar neuropathy at elbow of right upper extremity 01/03/2019   Past Surgical History:  Procedure Laterality Date   CATARACT EXTRACTION W/ INTRAOCULAR LENS IMPLANT Bilateral 2023   COLONOSCOPY     CORONARY PRESSURE/FFR STUDY N/A 06/23/2017   Procedure: INTRAVASCULAR PRESSURE WIRE/FFR STUDY;  Surgeon: Anner Alm ORN, MD;  Location: [facility] INVASIVE CV LAB;  Service: Cardiovascular;  Laterality: N/A;   CORONARY STENT INTERVENTION N/A 06/23/2017   Procedure: CORONARY  STENT INTERVENTION;  Surgeon: Anner Alm ORN, MD;  Location: [facility] INVASIVE CV LAB;  Service: Cardiovascular;  Laterality: N/A;   KNEE ARTHROSCOPY Left    LEFT HEART CATH AND CORONARY ANGIOGRAPHY N/A 06/23/2017   Procedure: LEFT HEART CATH AND CORONARY ANGIOGRAPHY;  Surgeon: Anner Alm ORN, MD;  Location: [facility] INVASIVE CV LAB;  Service: Cardiovascular;  Laterality: N/A;   LEFT HEART CATH AND CORONARY ANGIOGRAPHY N/A 07/14/2023    Procedure: LEFT HEART CATH AND CORONARY ANGIOGRAPHY;  Surgeon: Elmira Newman PARAS, MD;  Location: [facility] INVASIVE CV LAB;  Service: Cardiovascular;  Laterality: N/A;   SHOULDER ARTHROSCOPY WITH BICEPSTENOTOMY Right 09/06/2019   Procedure: SHOULDER ARTHROSCOPY WITH BICEPSTENOTOMY;  Surgeon: Jerri Kay HERO, MD;  Location: [facility];  Service: Orthopedics;  Laterality: Right;   SHOULDER ARTHROSCOPY WITH SUBACROMIAL DECOMPRESSION Right 09/06/2019   Procedure: RIGHT SHOULDER ARTHROSCOPY WITH EXTENSIVE DEBRIDEMENT, SUBACROMIAL DECOMPRESSION, BICEPS TENOTOMY;  Surgeon: Jerri Kay HERO, MD;  Location: [facility];  Service: Orthopedics;  Laterality: Right;   TENOTOMY ACHILLES TENDON  2007   In [place], [place]   ULNAR NERVE TRANSPOSITION  2006   In [place], [place]   Patient Active Problem List   Diagnosis Date Noted   Injection site reaction 03/14/2024   Hypertension    PONV (postoperative nausea and vomiting)    Preop cardiovascular exam 10/18/2023   Pernicious anemia    Hallux rigidus, right foot 09/15/2021   Spondylolisthesis at L5-S1 level 05/09/2021   Low back pain at multiple sites 02/05/2021   Osteoarthritis    Heart murmur    Exercise-induced asthma    Diabetes mellitus without complication (HCC)    Arthritis    Anemia    Myofascial pain syndrome, cervical 07/28/2019   Lateral epicondylitis of right elbow 07/28/2019   Nontraumatic incomplete tear of right rotator cuff 07/25/2019   Tendinopathy of right biceps tendon 07/25/2019   Erectile dysfunction 07/11/2019   Mild persistent asthma/cough variant asthma 04/13/2019   Allergic rhinitis with a possible nonallergic component 04/13/2019   Acid reflux 04/13/2019   Chronic cough 04/13/2019   Hoarseness 04/13/2019   Cervical radicular pain 01/10/2019   Ulnar neuropathy at elbow of right upper extremity 01/03/2019   Acute bilateral low back pain without sciatica 01/03/2019   Neck pain 06/01/2018   Occipital  headache 06/01/2018   Onychomycosis 05/25/2018   Palpitations 07/06/2017   CAD (coronary artery disease) 07/05/2017   Family history of early CAD 07/05/2017   Abnormal findings on diagnostic imaging of cardiovascular system 06/23/2017   Low vitamin D  level 04/22/2017   Pain in right ankle and joints of right foot 10/05/2016   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   Hemarthrosis of right elbow 02/03/2016   Small thenar eminence 02/03/2016   Cervical disc disorder with radiculopathy of cervical region 10/07/2015   Incomplete rotator cuff tear 09/10/2015   Nonischemic cardiomyopathy (HCC) 05/15/2015   Fatigue 05/15/2015   Dyspnea on exertion 05/15/2015   Decreased cardiac ejection fraction 04/02/2015   Strain of latissimus dorsi muscle 01/21/2015   Shoulder bursitis 10/12/2014   Spinal stenosis, lumbar region, without neurogenic claudication 10/12/2014   BPPV (benign paroxysmal positional vertigo) 08/14/2014   Hyperlipidemia 01/19/2014   Anemia, unspecified 01/19/2014   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   Type 2 diabetes mellitus with hyperglycemia, without long-term current use of insulin  (HCC) 12/22/2013   Hypogonadism in male 12/22/2013   Arthritis of hand, left 12/22/2013    PCP: Frann Mabel Mt, DO  REFERRING PROVIDER:  Colon Shove, MD  REFERRING DIAG: Spondylolisthesis, lumbosacral region [M43.17]   Rationale for Evaluation and Treatment: Rehabilitation  THERAPY DIAG:  Muscle weakness (generalized)  Chronic bilateral low back pain with right-sided sciatica  Other symptoms and signs involving the musculoskeletal system  ONSET DATE: 10/25/23 surgery date.  SUBJECTIVE:                                                                                                                                                                                           SUBJECTIVE STATEMENT: Felt pretty good after my last aquatic session.  I did not get  the increased pain I was expecting.  I have been getting some total pain relief at times I have noticed  7/10 pain    Eval: Pt was playing pickleball at [employer] in January 2025l. He reports having pain following game and was unable to walk. Started having legs give way. Pain has been consistent prior/ after surgery, but no longer shooting pain. He also noted numbness in bilat LE's into feet. He states that numbness is new. My doctor says  I cannot do anymore damage at this point, He also said not to twist, I dont twist anymore as it flares everything up. I have tried walking on the beach and on uneven surfaces and it continues to cause a lot of pain and difficulty.   PERTINENT HISTORY:  Cardiac, Acid reflux, Anemia, Arthritis, ADD, BPPV, CAD, DM, Dyspnea on exertion, Osteoarthritis.     PAIN:  Are you having pain? Yes: NPRS scale: 3/10 Pain location: bil lower back.  Pain description: Achy pain, sharp pain Aggravating factors: twisting, walking on uneven surfaces.  Relieving factors: Resting.   PRECAUTIONS: None  RED FLAGS: None   WEIGHT BEARING RESTRICTIONS: No  FALLS:  Has patient fallen in last 6 months? No  LIVING ENVIRONMENT: Lives with: lives with their family Lives in: House/apartment Stairs: 24 stairs internally, 3 externally.  Has following equipment at home: None  OCCUPATION: Retired   PLOF: Independent  PATIENT GOALS: Pt would like to get back to walking.   NEXT MD VISIT: None currently.   OBJECTIVE:  Note: Objective measures were completed at Evaluation unless otherwise noted.  DIAGNOSTIC FINDINGS:  1. Status post L4-S1 discectomy and fusion without evidence of complication. The central canal and foramina appear widely patent at the surgical levels. 2. Mild to moderate central canal narrowing at L2-3 and L3-4. 3. Aortic atherosclerosis.   Aortic Atherosclerosis (ICD10-I70.0).  PATIENT SURVEYS:  Oswestry: 21/50  COGNITION: Overall cognitive  status: Within functional limits for tasks assessed     SENSATION: WFL   POSTURE: rounded shoulders  and forward head  PALPATION: Tenderness across lower back.   LUMBAR ROM:   AROM eval  Flexion 15%* hip flexion mostly.   Extension 10%   Right lateral flexion Pt denies movement due to pain  Left lateral flexion Pt denies movement due to pain  Right rotation Pt denies movement due to pain  Left rotation Pt denies movement due to pain   (Blank rows = not tested)  LOWER EXTREMITY ROM:     Active  Right eval Left eval  Hip flexion WFL with reports of pain in lower back  [facility] with reports of pain in lower back   Hip extension    Hip abduction    Hip adduction    Hip internal rotation    Hip external rotation [facility]  Knee flexion [facility]  [facility]   Knee extension WFL with reports of pain in lower back [facility] with reports of pain in lower back   (Blank rows = not tested)  LOWER EXTREMITY MMT:    MMT Right eval Left eval  Hip flexion    Hip extension    Hip abduction    Hip adduction    Hip internal rotation    Hip external rotation 4 4  Knee flexion    Knee extension     (Blank rows = not tested)   FUNCTIONAL TESTS:  5 times sit to stand: 26.22 seconds.   GAIT: Distance walked: 13ft   Assistive device utilized: None Level of assistance: Complete Independence Comments: Pt walks with a slow antalgic gait. Minimal rotation.   TREATMENT DATE:  [facility] Adult PT Treatment:                                             Date: 03/24/24 Pt seen for aquatic therapy today.  Treatment took place in water 3.5-4.75 ft in depth at the [facility] pool. Temp of water was 91.  Pt entered/exited the pool via stairs independently with bil rail.  - unsupported walking forward/ backward and side stepping-> marching forward / backward  - side stepping with arm add/abdct with yellow hand floats  - side stepping ue add/abd with slight lunge.  Cuing for abd bracing -Ue support yellow HB:  traditional squats with abd bracing; pushing yellow HB relaxed squat - UE wall and HB forward alternating lunges - L stretch good stretch center and side. Added hip hiking with good stretch pt slightly limited due to neck discomfort - hip hinge: verbal and tactile cues for weight shift/execution x 8 - decompression with yellow noodle posteriorly across chest: cycling    11/10 STM bilat lumbar paraspinals  LTR 2x10 (half turns, pushing through legs to simulate rolling in bed)  Pain neuro-science, desensitization techniques, progressive pool exercise,   [facility] Adult PT Treatment:                                             Date: 11/7 Pt seen for aquatic therapy today.  Treatment took place in water 3.5-4.75 ft in depth at the [facility] pool. Temp of water was 91.  Pt entered/exited the pool via stairs independently with bil rail.  - Intro to aquatic therapy principles - unsupported -> UE on walking forward/ backward -> marching forward /  backward with row motion  - side stepping with arm horz add/abdct with yellow hand floats  - UE on wall:  toe/heel raises x10; hip add/abd 2x10 ; hip flexion /extension x8 (pain with hip extension); relaxed squats x10 - TrA set with hollow noodle pull down to thighs x 10  - straddling noodle with UE support on corner: cycling  - STS from 3rd step with forward arm reach x 4  Pt requires the buoyancy and hydrostatic pressure of water for support, and to offload joints by unweighting joint load by at least 50 % in navel deep water and by at least 75-80% in chest to neck deep water.  Viscosity of the water is needed for resistance of strengthening. Water current perturbations provides challenge to standing balance requiring increased core activation.     11/3 STM bilat lumbar paraspinals   LTR 10x PPT 2x10 Seated pelvic tilting A and P 20x Curl up 10x 2s  Anatomy of condition, tissue tolerance and loading tolerance, functional capacity and  envelope of function    02/28/24 - Nustep lvl 5, 5 min - LTR to tolerance  - Bridges x15  - SLR 2 x10 - figure 4 stretch  - Supine ball squeezes, 5 sec holds.  - Sideling clams  - LAQ  - HS curl with GTB  - Seated HS stretch     02/21/24                                                                                                                              - Education on self care management.  - Ball squeezes in supine - LTR to tolerance.    PATIENT EDUCATION:  Education details: Educated pt on anatomy and physiology of current symptoms, intro to aquatic therapy, posture and body mechanics Person educated: Patient Education method: Explanation, Demonstration, and Handouts Education comprehension: verbalized understanding and returned demonstration  HOME EXERCISE PROGRAM: Access Code: [email or web address] URL: [email or web address] Date: 02/21/2024 Prepared by: Rojean Batten  Exercises - Supine Lower Trunk Rotation  - 1-2 x daily - 7 x weekly - 2-3 sets - 10 reps - Supine Hip Adduction Isometric with Ball  - 1-2 x daily - 7 x weekly - 2-3 sets - 10 reps   ASSESSMENT:  CLINICAL IMPRESSION:  Pt reports his overall pain has come down about 25%.  He reports compliance with HEPs and feels he is making good progress.  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  Progressed core strengthening today.  He demonstrates improvement with lunges with add ue support on wall. Reduction of pain 5 NPRS post session.  Good toleration of posterior core engagement with hip hinging.  STG met.  Goals ongoing   Eval: Patient referred to PT for chronic lower back pain. Pt has fear avoidance and does not perform lumbar flexion or rotation per subjective reports. He has been through  extensive PT, but failed. Received surgery in June 2025 for posterior lumbar interbody fusion of L5 and S1. Patient will benefit from skilled PT to  address below impairments, limitations and improve overall function.  OBJECTIVE IMPAIRMENTS: decreased activity tolerance, difficulty walking, decreased balance, decreased endurance, decreased mobility, decreased ROM, decreased strength, impaired flexibility, impaired UE/LE use, postural dysfunction, and pain.  ACTIVITY LIMITATIONS: bending, lifting, carry, locomotion, cleaning, community activity, driving, and or occupation  PERSONAL FACTORS: Cardiac, Acid reflux, Anemia, Arthritis, ADD, BPPV, CAD, DM, Dyspnea on exertion, Osteoarthritis.  are also affecting patient's functional outcome.  REHAB POTENTIAL: Good  CLINICAL DECISION MAKING: Evolving/moderate complexity  EVALUATION COMPLEXITY: Moderate    GOALS: Short term PT Goals Target date: 03/06/24 Pt will be I and compliant with HEP. Baseline:  Goal status: Met 03/24/24 Pt will decrease pain by 25% overall Baseline: Goal status: Met 03/24/24  Long term PT goals Target date: 04/24/2024 Pt will improve ROM to [facility] to improve functional mobility Baseline: Goal status: New Pt will improve hip/knee strength to at least 5-/5 MMT to improve functional strength Baseline: Goal status: New Pt will improve OSWESTRY to at least % functional to show improved function Baseline: Goal status: New Pt will reduce pain by overall 50% overall with usual activity Baseline: Goal status: New Pt will reduce pain to overall less than 3-4/10 with usual activity and work activity. Baseline: Goal status: New Pt will be able to ambulate on uneven surfaces at least 500 ft WNL gait pattern without complaints Baseline: Goal status: New   7. Pt will be able to perform SLS for 30 seconds on each leg without LOB.  Baseline:   Goal status: New  PLAN: PT FREQUENCY: 1-3 times per week   PT DURATION: 6-8 weeks  PLANNED INTERVENTIONS (unless contraindicated): aquatic PT, Canalith repositioning, cryotherapy, Electrical stimulation, Iontophoresis with 4  mg/ml dexamethasome, Moist heat, traction, Ultrasound, gait training, Therapeutic exercise, balance training, neuromuscular re-education, patient/family education, prosthetic training, manual techniques, passive ROM, dry needling, taping, vasopnuematic device, vestibular, spinal manipulations, joint manipulations  PLAN FOR NEXT SESSION: Assess HEP/update PRN, continue to progress functional mobility, work on balance, work on designer, jewellery.  Core control.   Ronal Lakeland) Flemon Kelty MPT 03/24/24 11:49 AM [facility] [address] [place], [place], [address] Phone: [phone]   Fax:  [phone]

## 2024-03-27 ENCOUNTER — Other Ambulatory Visit (INDEPENDENT_AMBULATORY_CARE_PROVIDER_SITE_OTHER)

## 2024-03-27 ENCOUNTER — Ambulatory Visit: Payer: Self-pay | Admitting: Family Medicine

## 2024-03-27 DIAGNOSIS — D649 Anemia, unspecified: Secondary | ICD-10-CM

## 2024-03-27 LAB — CBC WITH DIFFERENTIAL/PLATELET
Basophils Absolute: 0 K/uL (ref 0.0–0.1)
Basophils Relative: 0.5 % (ref 0.0–3.0)
Eosinophils Absolute: 0.1 K/uL (ref 0.0–0.7)
Eosinophils Relative: 1.3 % (ref 0.0–5.0)
HCT: 28.5 % — ABNORMAL LOW (ref 39.0–52.0)
Hemoglobin: 9.2 g/dL — ABNORMAL LOW (ref 13.0–17.0)
Lymphocytes Relative: 28.3 % (ref 12.0–46.0)
Lymphs Abs: 1.4 K/uL (ref 0.7–4.0)
MCHC: 32.1 g/dL (ref 30.0–36.0)
MCV: 81.1 fl (ref 78.0–100.0)
Monocytes Absolute: 0.5 K/uL (ref 0.1–1.0)
Monocytes Relative: 9.3 % (ref 3.0–12.0)
Neutro Abs: 3.1 K/uL (ref 1.4–7.7)
Neutrophils Relative %: 60.6 % (ref 43.0–77.0)
Platelets: 293 K/uL (ref 150.0–400.0)
RBC: 3.52 Mil/uL — ABNORMAL LOW (ref 4.22–5.81)
RDW: 19.8 % — ABNORMAL HIGH (ref 11.5–15.5)
WBC: 5 K/uL (ref 4.0–10.5)

## 2024-03-28 ENCOUNTER — Ambulatory Visit (HOSPITAL_BASED_OUTPATIENT_CLINIC_OR_DEPARTMENT_OTHER): Payer: Self-pay | Admitting: Physical Therapy

## 2024-03-28 ENCOUNTER — Other Ambulatory Visit

## 2024-03-28 ENCOUNTER — Ambulatory Visit: Payer: Self-pay | Admitting: Family Medicine

## 2024-03-28 ENCOUNTER — Other Ambulatory Visit: Payer: Self-pay

## 2024-03-28 ENCOUNTER — Other Ambulatory Visit (HOSPITAL_COMMUNITY): Payer: Self-pay

## 2024-03-28 ENCOUNTER — Encounter: Payer: Self-pay | Admitting: Family Medicine

## 2024-03-28 ENCOUNTER — Encounter (HOSPITAL_BASED_OUTPATIENT_CLINIC_OR_DEPARTMENT_OTHER): Payer: Self-pay | Admitting: Physical Therapy

## 2024-03-28 DIAGNOSIS — D649 Anemia, unspecified: Secondary | ICD-10-CM | POA: Diagnosis not present

## 2024-03-28 DIAGNOSIS — R29898 Other symptoms and signs involving the musculoskeletal system: Secondary | ICD-10-CM

## 2024-03-28 DIAGNOSIS — M6281 Muscle weakness (generalized): Secondary | ICD-10-CM | POA: Diagnosis not present

## 2024-03-28 DIAGNOSIS — G8929 Other chronic pain: Secondary | ICD-10-CM

## 2024-03-28 DIAGNOSIS — M5441 Lumbago with sciatica, right side: Secondary | ICD-10-CM | POA: Diagnosis not present

## 2024-03-28 LAB — IBC + FERRITIN
Ferritin: 5.4 ng/mL — ABNORMAL LOW (ref 22.0–322.0)
Iron: 38 ug/dL — ABNORMAL LOW (ref 42–165)
Saturation Ratios: 7.4 % — ABNORMAL LOW (ref 20.0–50.0)
TIBC: 516.6 ug/dL — ABNORMAL HIGH (ref 250.0–450.0)
Transferrin: 369 mg/dL — ABNORMAL HIGH (ref 212.0–360.0)

## 2024-03-28 LAB — B12 AND FOLATE PANEL
Folate: >23.7 ng/mL (ref >5.9)
Vitamin B-12: 205 pg/mL — ABNORMAL LOW (ref 211–911)

## 2024-03-28 NOTE — Telephone Encounter (Signed)
 Called pt and lab appt, nurse visit schedule.

## 2024-03-28 NOTE — Therapy (Signed)
 OUTPATIENT PHYSICAL THERAPY THORACOLUMBAR TREATMENT   Patient Name: Reginald Rios MRN: [id number] DOB:1956-04-22, 68 y.o., male Today's Date: 03/28/2024  END OF SESSION:     PT End of Session - 03/28/24 1409     Visit Number 8    Date for Recertification  04/10/24    Authorization Type [employer]    Progress Note Due on Visit 10    PT Start Time 1400    PT Stop Time 1439    PT Time Calculation (min) 39 min    Activity Tolerance Patient tolerated treatment well    Behavior During Therapy [facility] for tasks assessed/performed           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   BPPV (benign paroxysmal positional vertigo) 08/14/2014   CAD (coronary artery disease)    2/19 PCI/DESx1 to Lcx   Cervical disc disorder with radiculopathy of cervical region 10/07/2015   Cervical radicular pain 01/10/2019   Chronic cough 04/13/2019   Decreased cardiac ejection fraction 04/02/2015   Diabetes mellitus without complication (HCC)    Dyspnea on exertion 05/15/2015   Erectile dysfunction 07/11/2019   Exercise-induced asthma    Family history of early CAD 07/05/2017   Fatigue 05/15/2015   Hallux rigidus, right foot 09/15/2021   Heart murmur    In early childhood. No issues now   Hemarthrosis of right elbow 02/03/2016   Hoarseness 04/13/2019   Hyperlipidemia    family hx of high cholesterol   Hypertension    Hypogonadism in male    Incomplete rotator cuff tear 09/10/2015   Injected 09/10/2015   Lateral epicondylitis of right elbow 07/28/2019   Low back pain at multiple sites  02/05/2021   Low vitamin D  level 04/22/2017   Mild persistent asthma/cough variant asthma 04/13/2019   Myofascial pain syndrome, cervical 07/28/2019   Neck pain 06/01/2018   Nonischemic cardiomyopathy (HCC) 05/15/2015   Nontraumatic incomplete tear of right rotator cuff 07/25/2019   Occipital headache 06/01/2018   Onychomycosis 05/25/2018   Osteoarthritis    Pain in right ankle and joints of right foot 10/05/2016   Palpitations 07/06/2017   Pernicious anemia    PONV (postoperative nausea and vomiting)    Preop cardiovascular exam 10/18/2023   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   Shoulder bursitis 10/12/2014   Injected in 10/12/2014  Repeat 01/21/2015   Small thenar eminence 02/03/2016   Spinal stenosis, lumbar region, without neurogenic claudication 10/12/2014   Spondylolisthesis at L5-S1 level 05/09/2021   Strain of latissimus dorsi muscle 01/21/2015   Tendinopathy of right biceps tendon 07/25/2019   Type 2 diabetes mellitus with hyperglycemia, without long-term current use of insulin  (HCC) 12/22/2013   Ulnar neuropathy at elbow of right upper extremity 01/03/2019   Past Surgical History:  Procedure Laterality Date   CATARACT EXTRACTION W/ INTRAOCULAR LENS IMPLANT Bilateral 2023   COLONOSCOPY     CORONARY PRESSURE/FFR STUDY N/A 06/23/2017   Procedure: INTRAVASCULAR PRESSURE WIRE/FFR STUDY;  Surgeon: Anner Alm ORN, MD;  Location: [facility] INVASIVE CV LAB;  Service: Cardiovascular;  Laterality: N/A;   CORONARY STENT INTERVENTION N/A 06/23/2017   Procedure: CORONARY  STENT INTERVENTION;  Surgeon: Anner Alm ORN, MD;  Location: [facility] INVASIVE CV LAB;  Service: Cardiovascular;  Laterality: N/A;   KNEE ARTHROSCOPY Left    LEFT HEART CATH AND CORONARY ANGIOGRAPHY N/A 06/23/2017   Procedure: LEFT HEART CATH AND CORONARY ANGIOGRAPHY;  Surgeon: Anner Alm ORN, MD;  Location: [facility] INVASIVE CV LAB;  Service: Cardiovascular;  Laterality: N/A;   LEFT HEART CATH AND CORONARY ANGIOGRAPHY N/A 07/14/2023    Procedure: LEFT HEART CATH AND CORONARY ANGIOGRAPHY;  Surgeon: Elmira Newman PARAS, MD;  Location: [facility] INVASIVE CV LAB;  Service: Cardiovascular;  Laterality: N/A;   SHOULDER ARTHROSCOPY WITH BICEPSTENOTOMY Right 09/06/2019   Procedure: SHOULDER ARTHROSCOPY WITH BICEPSTENOTOMY;  Surgeon: Jerri Kay HERO, MD;  Location: [facility];  Service: Orthopedics;  Laterality: Right;   SHOULDER ARTHROSCOPY WITH SUBACROMIAL DECOMPRESSION Right 09/06/2019   Procedure: RIGHT SHOULDER ARTHROSCOPY WITH EXTENSIVE DEBRIDEMENT, SUBACROMIAL DECOMPRESSION, BICEPS TENOTOMY;  Surgeon: Jerri Kay HERO, MD;  Location: [facility];  Service: Orthopedics;  Laterality: Right;   TENOTOMY ACHILLES TENDON  2007   In [place], [place]   ULNAR NERVE TRANSPOSITION  2006   In [place], [place]   Patient Active Problem List   Diagnosis Date Noted   Injection site reaction 03/14/2024   Hypertension    PONV (postoperative nausea and vomiting)    Preop cardiovascular exam 10/18/2023   Pernicious anemia    Hallux rigidus, right foot 09/15/2021   Spondylolisthesis at L5-S1 level 05/09/2021   Low back pain at multiple sites 02/05/2021   Osteoarthritis    Heart murmur    Exercise-induced asthma    Diabetes mellitus without complication (HCC)    Arthritis    Anemia    Myofascial pain syndrome, cervical 07/28/2019   Lateral epicondylitis of right elbow 07/28/2019   Nontraumatic incomplete tear of right rotator cuff 07/25/2019   Tendinopathy of right biceps tendon 07/25/2019   Erectile dysfunction 07/11/2019   Mild persistent asthma/cough variant asthma 04/13/2019   Allergic rhinitis with a possible nonallergic component 04/13/2019   Acid reflux 04/13/2019   Chronic cough 04/13/2019   Hoarseness 04/13/2019   Cervical radicular pain 01/10/2019   Ulnar neuropathy at elbow of right upper extremity 01/03/2019   Acute bilateral low back pain without sciatica 01/03/2019   Neck pain 06/01/2018   Occipital  headache 06/01/2018   Onychomycosis 05/25/2018   Palpitations 07/06/2017   CAD (coronary artery disease) 07/05/2017   Family history of early CAD 07/05/2017   Abnormal findings on diagnostic imaging of cardiovascular system 06/23/2017   Low vitamin D  level 04/22/2017   Pain in right ankle and joints of right foot 10/05/2016   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   Hemarthrosis of right elbow 02/03/2016   Small thenar eminence 02/03/2016   Cervical disc disorder with radiculopathy of cervical region 10/07/2015   Incomplete rotator cuff tear 09/10/2015   Nonischemic cardiomyopathy (HCC) 05/15/2015   Fatigue 05/15/2015   Dyspnea on exertion 05/15/2015   Decreased cardiac ejection fraction 04/02/2015   Strain of latissimus dorsi muscle 01/21/2015   Shoulder bursitis 10/12/2014   Spinal stenosis, lumbar region, without neurogenic claudication 10/12/2014   BPPV (benign paroxysmal positional vertigo) 08/14/2014   Hyperlipidemia 01/19/2014   Anemia, unspecified 01/19/2014   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   Type 2 diabetes mellitus with hyperglycemia, without long-term current use of insulin  (HCC) 12/22/2013   Hypogonadism in male 12/22/2013   Arthritis of hand, left 12/22/2013    PCP: Frann Mabel Mt, DO  REFERRING PROVIDER:  Colon Shove, MD  REFERRING DIAG: Spondylolisthesis, lumbosacral region [M43.17]   Rationale for Evaluation and Treatment: Rehabilitation  THERAPY DIAG:  Muscle weakness (generalized)  Chronic bilateral low back pain with right-sided sciatica  Other symptoms and signs involving the musculoskeletal system  ONSET DATE: 10/25/23 surgery date.  SUBJECTIVE:                                                                                                                                                                                           SUBJECTIVE STATEMENT: Felt pretty good after my last aquatic session. Pt reports  relief with last session, but it doesn't last.       Eval: Pt was playing pickleball at [employer] in January 2025l. He reports having pain following game and was unable to walk. Started having legs give way. Pain has been consistent prior/ after surgery, but no longer shooting pain. He also noted numbness in bilat LE's into feet. He states that numbness is new. My doctor says  I cannot do anymore damage at this point, He also said not to twist, I dont twist anymore as it flares everything up. I have tried walking on the beach and on uneven surfaces and it continues to cause a lot of pain and difficulty.   PERTINENT HISTORY:  Cardiac, Acid reflux, Anemia, Arthritis, ADD, BPPV, CAD, DM, Dyspnea on exertion, Osteoarthritis.     PAIN:  Are you having pain? Yes: NPRS scale: 7/10 Pain location: bil lower back.  Pain description: Achy pain, sharp pain Aggravating factors: twisting, walking on uneven surfaces.  Relieving factors: Resting.   PRECAUTIONS: None  RED FLAGS: None   WEIGHT BEARING RESTRICTIONS: No  FALLS:  Has patient fallen in last 6 months? No  LIVING ENVIRONMENT: Lives with: lives with their family Lives in: House/apartment Stairs: 24 stairs internally, 3 externally.  Has following equipment at home: None  OCCUPATION: Retired   PLOF: Independent  PATIENT GOALS: Pt would like to get back to walking.   NEXT MD VISIT: None currently.   OBJECTIVE:  Note: Objective measures were completed at Evaluation unless otherwise noted.  DIAGNOSTIC FINDINGS:  1. Status post L4-S1 discectomy and fusion without evidence of complication. The central canal and foramina appear widely patent at the surgical levels. 2. Mild to moderate central canal narrowing at L2-3 and L3-4. 3. Aortic atherosclerosis.   Aortic Atherosclerosis (ICD10-I70.0).  PATIENT SURVEYS:  Oswestry: 21/50  COGNITION: Overall cognitive status: Within functional limits for tasks  assessed     SENSATION: WFL   POSTURE: rounded shoulders and forward head  PALPATION: Tenderness across lower back.   LUMBAR ROM:  AROM eval  Flexion 15%* hip flexion mostly.   Extension 10%   Right lateral flexion Pt denies movement due to pain  Left lateral flexion Pt denies movement due to pain  Right rotation Pt denies movement due to pain  Left rotation Pt denies movement due to pain   (Blank rows = not tested)  LOWER EXTREMITY ROM:     Active  Right eval Left eval  Hip flexion WFL with reports of pain in lower back  [facility] with reports of pain in lower back   Hip extension    Hip abduction    Hip adduction    Hip internal rotation    Hip external rotation [facility]  Knee flexion [facility]  [facility]   Knee extension WFL with reports of pain in lower back [facility] with reports of pain in lower back   (Blank rows = not tested)  LOWER EXTREMITY MMT:    MMT Right eval Left eval  Hip flexion    Hip extension    Hip abduction    Hip adduction    Hip internal rotation    Hip external rotation 4 4  Knee flexion    Knee extension     (Blank rows = not tested)   FUNCTIONAL TESTS:  5 times sit to stand: 26.22 seconds.   GAIT: Distance walked: 43ft   Assistive device utilized: None Level of assistance: Complete Independence Comments: Pt walks with a slow antalgic gait. Minimal rotation.   TREATMENT DATE:  [facility] Adult PT Treatment:                                             Date: 03/28/24 Pt seen for aquatic therapy today.  Treatment took place in water 3.5-4.75 ft in depth at the [facility] pool. Temp of water was 91.  Pt entered/exited the pool via stairs independently with bil rail.  - unsupported walking forward/ backward and side stepping - side stepping with arm add/abdct with rainbow hand floats  - L stretch with gentle tail wag  - UE on wall:split squats x 5 each LE forward ->  forward walking split squats with yellow hand floats - side step into wide  squat with arm add/abdct with yellow hand floats - STS from 3rd step x 8, cues for neutral head and forward arm reach, control descent - UE On wall: leg swings into hip flex/extension x10; hip abdct/ add x 10  - straddling noodle with UE support on corner: cycling  - R/L hamstring stretch with heel on 2nd step and back straight 20 sec each -> swivel on standing foot for adductor stretch  [facility] Adult PT Treatment:                                             Date: 03/24/24 Pt seen for aquatic therapy today.  Treatment took place in water 3.5-4.75 ft in depth at the [facility] pool. Temp of water was 91.  Pt entered/exited the pool via stairs independently with bil rail.  - unsupported walking forward/ backward and side stepping-> marching forward / backward  - side stepping with arm add/abdct with yellow hand floats  - side stepping ue add/abd with slight lunge.  Cuing for abd bracing -Ue  support yellow HB: traditional squats with abd bracing; pushing yellow HB relaxed squat - UE wall and HB forward alternating lunges - L stretch good stretch center and side. Added hip hiking with good stretch pt slightly limited due to neck discomfort - hip hinge: verbal and tactile cues for weight shift/execution x 8 - decompression with yellow noodle posteriorly across chest: cycling    11/10 STM bilat lumbar paraspinals  LTR 2x10 (half turns, pushing through legs to simulate rolling in bed)  Pain neuro-science, desensitization techniques, progressive pool exercise,   [facility] Adult PT Treatment:                                             Date: 11/7 Pt seen for aquatic therapy today.  Treatment took place in water 3.5-4.75 ft in depth at the [facility] pool. Temp of water was 91.  Pt entered/exited the pool via stairs independently with bil rail.  - Intro to aquatic therapy principles - unsupported -> UE on walking forward/ backward -> marching forward / backward with row motion  -  side stepping with arm horz add/abdct with yellow hand floats  - UE on wall:  toe/heel raises x10; hip add/abd 2x10 ; hip flexion /extension x8 (pain with hip extension); relaxed squats x10 - TrA set with hollow noodle pull down to thighs x 10  - straddling noodle with UE support on corner: cycling  - STS from 3rd step with forward arm reach x 4  Pt requires the buoyancy and hydrostatic pressure of water for support, and to offload joints by unweighting joint load by at least 50 % in navel deep water and by at least 75-80% in chest to neck deep water.  Viscosity of the water is needed for resistance of strengthening. Water current perturbations provides challenge to standing balance requiring increased core activation.     11/3 STM bilat lumbar paraspinals   LTR 10x PPT 2x10 Seated pelvic tilting A and P 20x Curl up 10x 2s  Anatomy of condition, tissue tolerance and loading tolerance, functional capacity and envelope of function    02/28/24 - Nustep lvl 5, 5 min - LTR to tolerance  - Bridges x15  - SLR 2 x10 - figure 4 stretch  - Supine ball squeezes, 5 sec holds.  - Sideling clams  - LAQ  - HS curl with GTB  - Seated HS stretch     02/21/24                                                                                                                              - Education on self care management.  - Ball squeezes in supine - LTR to tolerance.    PATIENT EDUCATION:  Education details: Educated pt on anatomy and physiology of current symptoms, intro to aquatic therapy, posture  and body mechanics Person educated: Patient Education method: Explanation, Demonstration, and Handouts Education comprehension: verbalized understanding and returned demonstration  HOME EXERCISE PROGRAM: Access Code: [email or web address] URL: [email or web address] Date: 02/21/2024 Prepared by: Rojean Batten  Exercises - Supine Lower Trunk Rotation  - 1-2 x daily - 7 x weekly - 2-3 sets -  10 reps - Supine Hip Adduction Isometric with Ball  - 1-2 x daily - 7 x weekly - 2-3 sets - 10 reps   ASSESSMENT:  CLINICAL IMPRESSION:  Pt reported gradual reduction of pain and tightness in low back and LEs.  Will continue to progress as tolerated.  STG have been met.    Eval: Patient referred to PT for chronic lower back pain. Pt has fear avoidance and does not perform lumbar flexion or rotation per subjective reports. He has been through extensive PT, but failed. Received surgery in June 2025 for posterior lumbar interbody fusion of L5 and S1. Patient will benefit from skilled PT to address below impairments, limitations and improve overall function.  OBJECTIVE IMPAIRMENTS: decreased activity tolerance, difficulty walking, decreased balance, decreased endurance, decreased mobility, decreased ROM, decreased strength, impaired flexibility, impaired UE/LE use, postural dysfunction, and pain.  ACTIVITY LIMITATIONS: bending, lifting, carry, locomotion, cleaning, community activity, driving, and or occupation  PERSONAL FACTORS: Cardiac, Acid reflux, Anemia, Arthritis, ADD, BPPV, CAD, DM, Dyspnea on exertion, Osteoarthritis.  are also affecting patient's functional outcome.  REHAB POTENTIAL: Good  CLINICAL DECISION MAKING: Evolving/moderate complexity  EVALUATION COMPLEXITY: Moderate    GOALS: Short term PT Goals Target date: 03/06/24 Pt will be I and compliant with HEP. Baseline:  Goal status: Met 03/24/24 Pt will decrease pain by 25% overall Baseline: Goal status: Met 03/24/24  Long term PT goals Target date: 04/24/2024 Pt will improve ROM to [facility] to improve functional mobility Baseline: Goal status: New Pt will improve hip/knee strength to at least 5-/5 MMT to improve functional strength Baseline: Goal status: New Pt will improve OSWESTRY to at least % functional to show improved function Baseline: Goal status: New Pt will reduce pain by overall 50% overall with usual  activity Baseline: Goal status: New Pt will reduce pain to overall less than 3-4/10 with usual activity and work activity. Baseline: Goal status: New Pt will be able to ambulate on uneven surfaces at least 500 ft WNL gait pattern without complaints Baseline: Goal status: New   7. Pt will be able to perform SLS for 30 seconds on each leg without LOB.  Baseline:   Goal status: New  PLAN: PT FREQUENCY: 1-3 times per week   PT DURATION: 6-8 weeks  PLANNED INTERVENTIONS (unless contraindicated): aquatic PT, Canalith repositioning, cryotherapy, Electrical stimulation, Iontophoresis with 4 mg/ml dexamethasome, Moist heat, traction, Ultrasound, gait training, Therapeutic exercise, balance training, neuromuscular re-education, patient/family education, prosthetic training, manual techniques, passive ROM, dry needling, taping, vasopnuematic device, vestibular, spinal manipulations, joint manipulations  PLAN FOR NEXT SESSION: Assess HEP/update PRN, continue to progress functional mobility, work on balance, work on designer, jewellery.  Core control.   Delon Aquas, PTA 03/28/24 6:08 PM [facility] [address] [place], [place], [address] Phone: [phone]   Fax:  [phone]

## 2024-03-29 ENCOUNTER — Other Ambulatory Visit: Payer: Self-pay

## 2024-03-29 ENCOUNTER — Other Ambulatory Visit

## 2024-03-29 ENCOUNTER — Ambulatory Visit: Payer: Self-pay | Admitting: Family Medicine

## 2024-03-29 ENCOUNTER — Ambulatory Visit (INDEPENDENT_AMBULATORY_CARE_PROVIDER_SITE_OTHER)

## 2024-03-29 DIAGNOSIS — R718 Other abnormality of red blood cells: Secondary | ICD-10-CM

## 2024-03-29 DIAGNOSIS — D649 Anemia, unspecified: Secondary | ICD-10-CM

## 2024-03-29 DIAGNOSIS — E538 Deficiency of other specified B group vitamins: Secondary | ICD-10-CM

## 2024-03-29 LAB — MICROALBUMIN / CREATININE URINE RATIO
Creatinine,U: 80.5 mg/dL
Microalb Creat Ratio: 10.4 mg/g (ref 0.0–30.0)
Microalb, Ur: 0.8 mg/dL (ref 0.0–1.9)

## 2024-03-29 MED ORDER — CYANOCOBALAMIN 1000 MCG/ML IJ SOLN
1000.0000 ug | Freq: Once | INTRAMUSCULAR | Status: AC
  Administered 2024-03-29: 1000 ug via INTRAMUSCULAR

## 2024-03-29 NOTE — Progress Notes (Signed)
 Pt here today for monthly B12 injection per Dr. Frann.   Cyanocobalamin  1mL injected into L deltoid IM. Pt tolerated injection well.    Pt going back to q2 weeks B12 injections.    Frann Mabel Mt, DO to Jon Amy HERO, [place]     03/28/24  5:05 PM Have him come in for labs and order a urine microscopy and FOBT. Let's have him go back on shots every 2 weeks and recheck his B12 in 2 months.  Thx.

## 2024-03-29 NOTE — Telephone Encounter (Signed)
 They cant change it to an urinalysis for Alm.

## 2024-03-29 NOTE — Addendum Note (Signed)
 Addended by: Jermone Geister M on: 03/29/2024 03:55 PM   Modules accepted: Orders

## 2024-04-02 NOTE — Therapy (Signed)
 OUTPATIENT PHYSICAL THERAPY THORACOLUMBAR TREATMENT Progress Note Reporting Period 02/22/24 to 04/03/24  See note below for Objective Data and Assessment of Progress/Goals.      Patient Name: Reginald Rios MRN: [id number] DOB:Oct 14, 1955, 68 y.o., male Today's Date: 04/03/2024  END OF SESSION:     PT End of Session - 04/03/24 0928     Visit Number 9    Date for Recertification  04/10/24    Authorization Type [employer]    Progress Note Due on Visit 19    PT Start Time 0930    PT Stop Time 1010    PT Time Calculation (min) 40 min    Activity Tolerance Patient tolerated treatment well    Behavior During Therapy [facility] for tasks assessed/performed            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   BPPV (benign paroxysmal positional vertigo) 08/14/2014   CAD (coronary artery disease)    2/19 PCI/DESx1 to Lcx   Cervical disc disorder with radiculopathy of cervical region 10/07/2015   Cervical radicular pain 01/10/2019   Chronic cough 04/13/2019   Decreased cardiac ejection fraction 04/02/2015   Diabetes mellitus without complication (HCC)    Dyspnea on exertion 05/15/2015   Erectile dysfunction 07/11/2019   Exercise-induced asthma    Family history of early CAD 07/05/2017   Fatigue 05/15/2015   Hallux rigidus, right foot 09/15/2021   Heart murmur    In early childhood. No issues now   Hemarthrosis of right elbow 02/03/2016   Hoarseness 04/13/2019   Hyperlipidemia    family hx of high cholesterol   Hypertension    Hypogonadism in male    Incomplete rotator cuff  tear 09/10/2015   Injected 09/10/2015   Lateral epicondylitis of right elbow 07/28/2019   Low back pain at multiple sites 02/05/2021   Low vitamin D  level 04/22/2017   Mild persistent asthma/cough variant asthma 04/13/2019   Myofascial pain syndrome, cervical 07/28/2019   Neck pain 06/01/2018   Nonischemic cardiomyopathy (HCC) 05/15/2015   Nontraumatic incomplete tear of right rotator cuff 07/25/2019   Occipital headache 06/01/2018   Onychomycosis 05/25/2018   Osteoarthritis    Pain in right ankle and joints of right foot 10/05/2016   Palpitations 07/06/2017   Pernicious anemia    PONV (postoperative nausea and vomiting)    Preop cardiovascular exam 10/18/2023   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   Shoulder bursitis 10/12/2014   Injected in 10/12/2014  Repeat 01/21/2015   Small thenar eminence 02/03/2016   Spinal stenosis, lumbar region, without neurogenic claudication 10/12/2014   Spondylolisthesis at L5-S1 level 05/09/2021   Strain of latissimus dorsi muscle 01/21/2015   Tendinopathy of right biceps tendon 07/25/2019   Type 2 diabetes mellitus with hyperglycemia, without long-term current use of insulin  (HCC) 12/22/2013   Ulnar neuropathy at elbow of right upper extremity 01/03/2019   Past Surgical History:  Procedure Laterality Date   CATARACT EXTRACTION W/ INTRAOCULAR LENS IMPLANT Bilateral 2023   COLONOSCOPY     CORONARY PRESSURE/FFR STUDY N/A 06/23/2017   Procedure: INTRAVASCULAR PRESSURE WIRE/FFR STUDY;  Surgeon: Anner Alm ORN, MD;  Location: [facility] INVASIVE CV LAB;  Service: Cardiovascular;  Laterality: N/A;   CORONARY STENT INTERVENTION N/A 06/23/2017   Procedure: CORONARY STENT INTERVENTION;  Surgeon: Anner Alm ORN, MD;  Location: [facility] INVASIVE CV LAB;  Service: Cardiovascular;  Laterality: N/A;   KNEE ARTHROSCOPY Left    LEFT HEART CATH AND CORONARY ANGIOGRAPHY N/A 06/23/2017   Procedure: LEFT HEART CATH AND CORONARY ANGIOGRAPHY;  Surgeon: Anner Alm ORN, MD;  Location:  [facility] INVASIVE CV LAB;  Service: Cardiovascular;  Laterality: N/A;   LEFT HEART CATH AND CORONARY ANGIOGRAPHY N/A 07/14/2023   Procedure: LEFT HEART CATH AND CORONARY ANGIOGRAPHY;  Surgeon: Elmira Newman PARAS, MD;  Location: [facility] INVASIVE CV LAB;  Service: Cardiovascular;  Laterality: N/A;   SHOULDER ARTHROSCOPY WITH BICEPSTENOTOMY Right 09/06/2019   Procedure: SHOULDER ARTHROSCOPY WITH BICEPSTENOTOMY;  Surgeon: Jerri Kay HERO, MD;  Location: [facility];  Service: Orthopedics;  Laterality: Right;   SHOULDER ARTHROSCOPY WITH SUBACROMIAL DECOMPRESSION Right 09/06/2019   Procedure: RIGHT SHOULDER ARTHROSCOPY WITH EXTENSIVE DEBRIDEMENT, SUBACROMIAL DECOMPRESSION, BICEPS TENOTOMY;  Surgeon: Jerri Kay HERO, MD;  Location: [facility];  Service: Orthopedics;  Laterality: Right;   TENOTOMY ACHILLES TENDON  2007   In [place], [place]   ULNAR NERVE TRANSPOSITION  2006   In [place], [place]   Patient Active Problem List   Diagnosis Date Noted   Injection site reaction 03/14/2024   Hypertension    PONV (postoperative nausea and vomiting)    Preop cardiovascular exam 10/18/2023   Pernicious anemia    Hallux rigidus, right foot 09/15/2021   Spondylolisthesis at L5-S1 level 05/09/2021   Low back pain at multiple sites 02/05/2021   Osteoarthritis    Heart murmur    Exercise-induced asthma    Diabetes mellitus without complication (HCC)    Arthritis    Anemia    Myofascial pain syndrome, cervical 07/28/2019   Lateral epicondylitis of right elbow 07/28/2019   Nontraumatic incomplete tear of right rotator cuff 07/25/2019   Tendinopathy of right biceps tendon 07/25/2019   Erectile dysfunction 07/11/2019   Mild persistent asthma/cough variant asthma 04/13/2019   Allergic rhinitis with a possible nonallergic component 04/13/2019   Acid reflux 04/13/2019   Chronic cough 04/13/2019   Hoarseness 04/13/2019   Cervical radicular pain 01/10/2019   Ulnar neuropathy at elbow of right  upper extremity 01/03/2019   Acute bilateral low back pain without sciatica 01/03/2019   Neck pain 06/01/2018   Occipital headache 06/01/2018   Onychomycosis 05/25/2018   Palpitations 07/06/2017   CAD (coronary artery disease) 07/05/2017   Family history of early CAD 07/05/2017   Abnormal findings on diagnostic imaging of cardiovascular system 06/23/2017   Low vitamin D  level 04/22/2017   Pain in right ankle and joints of right foot 10/05/2016   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   Hemarthrosis of right elbow 02/03/2016   Small thenar eminence 02/03/2016   Cervical disc disorder with radiculopathy of cervical region 10/07/2015   Incomplete rotator cuff tear 09/10/2015   Nonischemic cardiomyopathy (HCC) 05/15/2015   Fatigue 05/15/2015   Dyspnea on exertion 05/15/2015   Decreased cardiac ejection fraction 04/02/2015   Strain of latissimus dorsi muscle 01/21/2015   Shoulder bursitis 10/12/2014   Spinal stenosis, lumbar region, without neurogenic claudication 10/12/2014   BPPV (benign paroxysmal positional vertigo) 08/14/2014   Hyperlipidemia 01/19/2014   Anemia, unspecified 01/19/2014   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   Type 2 diabetes mellitus with hyperglycemia, without long-term current use of insulin  (HCC) 12/22/2013  Hypogonadism in male 12/22/2013   Arthritis of hand, left 12/22/2013    PCP: Frann Mabel Mt, DO  REFERRING PROVIDER: Colon Shove, MD  REFERRING DIAG: Spondylolisthesis, lumbosacral region [M43.17]   Rationale for Evaluation and Treatment: Rehabilitation  THERAPY DIAG:  Muscle weakness (generalized)  Chronic bilateral low back pain with right-sided sciatica  Other symptoms and signs involving the musculoskeletal system  ONSET DATE: 10/25/23 surgery date.  SUBJECTIVE:                                                                                                                                                                                            SUBJECTIVE STATEMENT: Pt reports he has gotten better since beginning therapy. Pain has gotten less sharp and has centered into the middle of my back. I am able to do some movements that don't hurt anymore. Able to rake yard over weekend 15-20 mins.  Walked 20 mins this morning. Rt leg below knee is having some numbness in the shin and feels like the electricity (it shorted) has gone out.  I was walking may have pivoted I had a sharp pain in my rt ankle, lost my footing. Hasn't felt the same since.  Pain 6/10 center of back dull ache and into both hips    Eval: Pt was playing pickleball at [place] in January 2025l. He reports having pain following game and was unable to walk. Started having legs give way. Pain has been consistent prior/ after surgery, but no longer shooting pain. He also noted numbness in bilat LE's into feet. He states that numbness is new. My doctor says  I cannot do anymore damage at this point, He also said not to twist, I dont twist anymore as it flares everything up. I have tried walking on the beach and on uneven surfaces and it continues to cause a lot of pain and difficulty.   PERTINENT HISTORY:  Cardiac, Acid reflux, Anemia, Arthritis, ADD, BPPV, CAD, DM, Dyspnea on exertion, Osteoarthritis.     PAIN:  Are you having pain? Yes: NPRS scale: 7/10 Pain location: bil lower back.  Pain description: Achy pain, sharp pain Aggravating factors: twisting, walking on uneven surfaces.  Relieving factors: Resting.   PRECAUTIONS: None  RED FLAGS: None   WEIGHT BEARING RESTRICTIONS: No  FALLS:  Has patient fallen in last 6 months? No  LIVING ENVIRONMENT: Lives with: lives with their family Lives in: House/apartment Stairs: 24 stairs internally, 3 externally.  Has following equipment at home: None  OCCUPATION: Retired   PLOF: Independent  PATIENT GOALS: Pt would like to get back to walking.   NEXT MD VISIT:  None currently.    OBJECTIVE:  Note: Objective measures were completed at Evaluation unless otherwise noted.  DIAGNOSTIC FINDINGS:  1. Status post L4-S1 discectomy and fusion without evidence of complication. The central canal and foramina appear widely patent at the surgical levels. 2. Mild to moderate central canal narrowing at L2-3 and L3-4. 3. Aortic atherosclerosis.   Aortic Atherosclerosis (ICD10-I70.0).  PATIENT SURVEYS:  Oswestry: 21/50 04/03/24: 18/50  COGNITION: Overall cognitive status: Within functional limits for tasks assessed     SENSATION: WFL   POSTURE: rounded shoulders and forward head  PALPATION: Tenderness across lower back.   LUMBAR ROM:   AROM eval 04/03/24  Flexion 15%* hip flexion mostly.    Extension 10%  No change no pain  Right lateral flexion Pt denies movement due to pain 90% limited pain  Left lateral flexion Pt denies movement due to pain 75% limited no pain  Right rotation Pt denies movement due to pain Full no pain  Left rotation Pt denies movement due to pain Full no pain   (Blank rows = not tested)  LOWER EXTREMITY ROM:     Active  Right eval Left eval R / L 04/03/24  Hip flexion WFL with reports of pain in lower back  [facility] with reports of pain in lower back  Full no pain  Hip extension     Hip abduction     Hip adduction     Hip internal rotation     Hip external rotation [facility]   Knee flexion [facility]  [facility]    Knee extension WFL with reports of pain in lower back [facility] with reports of pain in lower back No pain   (Blank rows = not tested)  LOWER EXTREMITY MMT:    MMT Right eval Left eval  Hip flexion    Hip extension    Hip abduction    Hip adduction    Hip internal rotation    Hip external rotation 4 4  Knee flexion    Knee extension     (Blank rows = not tested)   FUNCTIONAL TESTS:  5 times sit to stand: 26.22 seconds.  04/03/24: 18.58  GAIT: Distance walked: 23ft   Assistive device utilized: None Level of assistance: Complete  Independence Comments: Pt walks with a slow antalgic gait. Minimal rotation.  04/03/24: improved cadence, offloading left/antalgic  TREATMENT DATE:   [facility] Adult PT Treatment:                                             Date: 04/03/24 Pt seen for aquatic therapy today.  Treatment took place in water 3.5-4.75 ft in depth at the [facility] pool. Temp of water was 91.  Pt entered/exited the pool via stairs independently with bil rail.  - unsupported walking forward/ backward and side stepping - side stepping with arm add/abdct with rainbow hand floats->- side step into wide squat with arm add/abdct with yellow hand floats - L stretch with gentle tail wag  - STS from 3rd step x 2x5 cues for neutral head and forward arm reach, control descent - figure 4 stretch at steps - UE On wall: leg swings into hip flex/extension x10; hip abdct/ add x 10  - straddling noodle with UE support on corner: cycling  - R/L hamstring stretch with heel on 2nd step and back straight 20 sec each ->  swivel on standing foot for adductor stretch  [facility] Adult PT Treatment:                                             Date: 03/28/24 Pt seen for aquatic therapy today.  Treatment took place in water 3.5-4.75 ft in depth at the [facility] pool. Temp of water was 91.  Pt entered/exited the pool via stairs independently with bil rail.  - unsupported walking forward/ backward and side stepping - side stepping with arm add/abdct with rainbow hand floats  - L stretch with gentle tail wag  - UE on wall:split squats x 5 each LE forward ->  forward walking split squats with yellow hand floats - side step into wide squat with arm add/abdct with yellow hand floats - STS from 3rd step x 8, cues for neutral head and forward arm reach, control descent - UE On wall: leg swings into hip flex/extension x10; hip abdct/ add x 10  - straddling noodle with UE support on corner: cycling  - R/L hamstring stretch with heel on  2nd step and back straight 20 sec each -> swivel on standing foot for adductor stretch  [facility] Adult PT Treatment:                                             Date: 03/24/24 Pt seen for aquatic therapy today.  Treatment took place in water 3.5-4.75 ft in depth at the [facility] pool. Temp of water was 91.  Pt entered/exited the pool via stairs independently with bil rail.  - unsupported walking forward/ backward and side stepping-> marching forward / backward  - side stepping with arm add/abdct with yellow hand floats  - side stepping ue add/abd with slight lunge.  Cuing for abd bracing -Ue support yellow HB: traditional squats with abd bracing; pushing yellow HB relaxed squat - UE wall and HB forward alternating lunges - L stretch good stretch center and side. Added hip hiking with good stretch pt slightly limited due to neck discomfort - hip hinge: verbal and tactile cues for weight shift/execution x 8 - decompression with yellow noodle posteriorly across chest: cycling    11/10 STM bilat lumbar paraspinals  LTR 2x10 (half turns, pushing through legs to simulate rolling in bed)  Pain neuro-science, desensitization techniques, progressive pool exercise,   [facility] Adult PT Treatment:                                             Date: 11/7 Pt seen for aquatic therapy today.  Treatment took place in water 3.5-4.75 ft in depth at the [facility] pool. Temp of water was 91.  Pt entered/exited the pool via stairs independently with bil rail.  - Intro to aquatic therapy principles - unsupported -> UE on walking forward/ backward -> marching forward / backward with row motion  - side stepping with arm horz add/abdct with yellow hand floats  - UE on wall:  toe/heel raises x10; hip add/abd 2x10 ; hip flexion /extension x8 (pain with hip extension); relaxed squats x10 - TrA set with hollow noodle pull down to thighs  x 10  - straddling noodle with UE support on corner: cycling  -  STS from 3rd step with forward arm reach x 4  Pt requires the buoyancy and hydrostatic pressure of water for support, and to offload joints by unweighting joint load by at least 50 % in navel deep water and by at least 75-80% in chest to neck deep water.  Viscosity of the water is needed for resistance of strengthening. Water current perturbations provides challenge to standing balance requiring increased core activation.     11/3 STM bilat lumbar paraspinals   LTR 10x PPT 2x10 Seated pelvic tilting A and P 20x Curl up 10x 2s  Anatomy of condition, tissue tolerance and loading tolerance, functional capacity and envelope of function    02/28/24 - Nustep lvl 5, 5 min - LTR to tolerance  - Bridges x15  - SLR 2 x10 - figure 4 stretch  - Supine ball squeezes, 5 sec holds.  - Sideling clams  - LAQ  - HS curl with GTB  - Seated HS stretch     02/21/24                                                                                                                              - Education on self care management.  - Ball squeezes in supine - LTR to tolerance.    PATIENT EDUCATION:  Education details: Educated pt on anatomy and physiology of current symptoms, intro to aquatic therapy, posture and body mechanics Person educated: Patient Education method: Explanation, Demonstration, and Handouts Education comprehension: verbalized understanding and returned demonstration  HOME EXERCISE PROGRAM: Access Code: [email or web address] URL: [email or web address] Date: 02/21/2024 Prepared by: Rojean Batten  Exercises - Supine Lower Trunk Rotation  - 1-2 x daily - 7 x weekly - 2-3 sets - 10 reps - Supine Hip Adduction Isometric with Ball  - 1-2 x daily - 7 x weekly - 2-3 sets - 10 reps   ASSESSMENT:  CLINICAL IMPRESSION:  PN: pt reports his pain with daily activities is down ~ 60% although he does continue to have pain as high as 6/10 during day.  Reports daily times without pain.  He is not limited due to pain with amb functional and community distance but does continue to demonstrate an antalgic gait. Lumbar ROM improved without pain in most directions.  Muscle tightness persists in lumbar spine and hips. He is not using an AD. Overall he has made good progress. All STGs met.  Most LTGs in progress.  He will continue to benefit from skilled PT intervention progressing toward meeting remaining set goals.   OBJECTIVE IMPAIRMENTS: decreased activity tolerance, difficulty walking, decreased balance, decreased endurance, decreased mobility, decreased ROM, decreased strength, impaired flexibility, impaired UE/LE use, postural dysfunction, and pain.  ACTIVITY LIMITATIONS: bending, lifting, carry, locomotion, cleaning, community activity, driving, and or occupation  PERSONAL FACTORS: Cardiac, Acid reflux, Anemia, Arthritis, ADD, BPPV, CAD, DM, Dyspnea on exertion, Osteoarthritis.  are also affecting patient's functional outcome.  REHAB POTENTIAL: Good  CLINICAL DECISION MAKING: Evolving/moderate complexity  EVALUATION COMPLEXITY: Moderate    GOALS: Short term PT Goals Target date: 03/06/24 Pt will be I and compliant with HEP. Baseline:  Goal status: Met 03/24/24 Pt will decrease pain by 25% overall Baseline: Goal status: Met 03/24/24  Long term PT goals Target date: 04/24/2024 Pt will improve ROM to [facility] to improve functional mobility Baseline: Goal status: Improving 04/03/24 Pt will improve hip/knee strength to at least 5-/5 MMT to improve functional strength Baseline: Goal status: New Pt will improve OSWESTRY to at least % functional to show improved function Baseline: Goal status: New Pt will reduce pain by overall 50% overall with usual activity Baseline: Goal status: MET 04/03/24 Pt will reduce pain to overall less than 3-4/10 with usual activity and work activity. Baseline: Goal status: In progress 04/03/24 Pt will be able to ambulate on uneven surfaces  at least 500 ft WNL gait pattern without complaints Baseline: Goal status: In progress 04/03/24   7. Pt will be able to perform SLS for 30 seconds on each leg without LOB.  Baseline:   Goal status: New  PLAN: PT FREQUENCY: 1-3 times per week   PT DURATION: 6-8 weeks  PLANNED INTERVENTIONS (unless contraindicated): aquatic PT, Canalith repositioning, cryotherapy, Electrical stimulation, Iontophoresis with 4 mg/ml dexamethasome, Moist heat, traction, Ultrasound, gait training, Therapeutic exercise, balance training, neuromuscular re-education, patient/family education, prosthetic training, manual techniques, passive ROM, dry needling, taping, vasopnuematic device, vestibular, spinal manipulations, joint manipulations  PLAN FOR NEXT SESSION: Assess HEP/update PRN, continue to progress functional mobility, work on balance, work on designer, jewellery.  Core control.   [address] [place]) Jenavi Beedle MPT 04/03/24 9:29 AM [facility] [address] [place], [place], [address] Phone: [phone]   Fax:  [phone]

## 2024-04-03 ENCOUNTER — Other Ambulatory Visit

## 2024-04-03 ENCOUNTER — Other Ambulatory Visit: Payer: Self-pay

## 2024-04-03 ENCOUNTER — Ambulatory Visit (HOSPITAL_BASED_OUTPATIENT_CLINIC_OR_DEPARTMENT_OTHER): Attending: Neurological Surgery | Admitting: Physical Therapy

## 2024-04-03 ENCOUNTER — Encounter (HOSPITAL_BASED_OUTPATIENT_CLINIC_OR_DEPARTMENT_OTHER): Payer: Self-pay | Admitting: Physical Therapy

## 2024-04-03 DIAGNOSIS — G8929 Other chronic pain: Secondary | ICD-10-CM | POA: Diagnosis present

## 2024-04-03 DIAGNOSIS — R718 Other abnormality of red blood cells: Secondary | ICD-10-CM | POA: Diagnosis not present

## 2024-04-03 DIAGNOSIS — R29898 Other symptoms and signs involving the musculoskeletal system: Secondary | ICD-10-CM | POA: Diagnosis present

## 2024-04-03 DIAGNOSIS — D649 Anemia, unspecified: Secondary | ICD-10-CM

## 2024-04-03 DIAGNOSIS — M5441 Lumbago with sciatica, right side: Secondary | ICD-10-CM | POA: Diagnosis present

## 2024-04-03 DIAGNOSIS — M6281 Muscle weakness (generalized): Secondary | ICD-10-CM | POA: Diagnosis present

## 2024-04-04 ENCOUNTER — Ambulatory Visit: Payer: Self-pay

## 2024-04-04 DIAGNOSIS — D509 Iron deficiency anemia, unspecified: Secondary | ICD-10-CM

## 2024-04-04 DIAGNOSIS — R195 Other fecal abnormalities: Secondary | ICD-10-CM

## 2024-04-04 LAB — URINALYSIS, ROUTINE W REFLEX MICROSCOPIC
Bilirubin Urine: NEGATIVE
Hgb urine dipstick: NEGATIVE
Leukocytes,Ua: NEGATIVE
Nitrite: NEGATIVE
Specific Gravity, Urine: 1.015 (ref 1.000–1.030)
Total Protein, Urine: NEGATIVE
Urine Glucose: >=1000 — AB
Urobilinogen, UA: 0.2 (ref 0.0–1.0)
pH: 5.5 (ref 5.0–8.0)

## 2024-04-05 LAB — FECAL OCCULT BLOOD, IMMUNOCHEMICAL: Fecal Occult Bld: POSITIVE — AB

## 2024-04-06 ENCOUNTER — Other Ambulatory Visit: Payer: Self-pay | Admitting: Family Medicine

## 2024-04-06 DIAGNOSIS — L308 Other specified dermatitis: Secondary | ICD-10-CM

## 2024-04-06 MED ORDER — LEVOCETIRIZINE DIHYDROCHLORIDE 5 MG PO TABS
5.0000 mg | ORAL_TABLET | Freq: Every evening | ORAL | 1 refills | Status: AC
  Filled 2024-04-06: qty 90, 90d supply, fill #0

## 2024-04-07 ENCOUNTER — Encounter (HOSPITAL_BASED_OUTPATIENT_CLINIC_OR_DEPARTMENT_OTHER): Payer: Self-pay

## 2024-04-07 ENCOUNTER — Other Ambulatory Visit (HOSPITAL_COMMUNITY): Payer: Self-pay

## 2024-04-07 ENCOUNTER — Other Ambulatory Visit: Payer: Self-pay

## 2024-04-07 ENCOUNTER — Ambulatory Visit (HOSPITAL_BASED_OUTPATIENT_CLINIC_OR_DEPARTMENT_OTHER): Admitting: Physical Therapy

## 2024-04-08 ENCOUNTER — Other Ambulatory Visit: Payer: Self-pay | Admitting: Family Medicine

## 2024-04-08 DIAGNOSIS — K219 Gastro-esophageal reflux disease without esophagitis: Secondary | ICD-10-CM

## 2024-04-10 ENCOUNTER — Other Ambulatory Visit (HOSPITAL_COMMUNITY): Payer: Self-pay

## 2024-04-10 ENCOUNTER — Other Ambulatory Visit: Payer: Self-pay

## 2024-04-10 MED ORDER — PANTOPRAZOLE SODIUM 40 MG PO TBEC
40.0000 mg | DELAYED_RELEASE_TABLET | Freq: Every day | ORAL | 0 refills | Status: AC
  Filled 2024-04-10: qty 90, 90d supply, fill #0

## 2024-04-11 ENCOUNTER — Ambulatory Visit (HOSPITAL_BASED_OUTPATIENT_CLINIC_OR_DEPARTMENT_OTHER): Admitting: Physical Therapy

## 2024-04-11 ENCOUNTER — Encounter (HOSPITAL_BASED_OUTPATIENT_CLINIC_OR_DEPARTMENT_OTHER): Payer: Self-pay | Admitting: Physical Therapy

## 2024-04-11 ENCOUNTER — Other Ambulatory Visit

## 2024-04-11 DIAGNOSIS — R29898 Other symptoms and signs involving the musculoskeletal system: Secondary | ICD-10-CM

## 2024-04-11 DIAGNOSIS — G8929 Other chronic pain: Secondary | ICD-10-CM

## 2024-04-11 DIAGNOSIS — M6281 Muscle weakness (generalized): Secondary | ICD-10-CM | POA: Diagnosis not present

## 2024-04-11 NOTE — Therapy (Signed)
 OUTPATIENT PHYSICAL THERAPY THORACOLUMBAR TREATMENT  Patient Name: Reginald Rios MRN: [id number] DOB:Aug 18, 1955, 68 y.o., male Today's Date: 04/11/2024  END OF SESSION:     PT End of Session - 04/11/24 1004     Visit Number 10    Authorization Type [employer]    Progress Note Due on Visit 19    PT Start Time 1000    PT Stop Time 1043    PT Time Calculation (min) 43 min    Behavior During Therapy [facility] for tasks assessed/performed            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   BPPV (benign paroxysmal positional vertigo) 08/14/2014   CAD (coronary artery disease)    2/19 PCI/DESx1 to Lcx   Cervical disc disorder with radiculopathy of cervical region 10/07/2015   Cervical radicular pain 01/10/2019   Chronic cough 04/13/2019   Decreased cardiac ejection fraction 04/02/2015   Diabetes mellitus without complication (HCC)    Dyspnea on exertion 05/15/2015   Erectile dysfunction 07/11/2019   Exercise-induced asthma    Family history of early CAD 07/05/2017   Fatigue 05/15/2015   Hallux rigidus, right foot 09/15/2021   Heart murmur    In early childhood. No issues now   Hemarthrosis of right elbow 02/03/2016   Hoarseness 04/13/2019   Hyperlipidemia    family hx of high cholesterol   Hypertension    Hypogonadism in male    Incomplete rotator cuff tear 09/10/2015   Injected 09/10/2015   Lateral epicondylitis of right elbow 07/28/2019   Low back pain at multiple sites 02/05/2021   Low vitamin D  level 04/22/2017   Mild persistent asthma/cough variant asthma  04/13/2019   Myofascial pain syndrome, cervical 07/28/2019   Neck pain 06/01/2018   Nonischemic cardiomyopathy (HCC) 05/15/2015   Nontraumatic incomplete tear of right rotator cuff 07/25/2019   Occipital headache 06/01/2018   Onychomycosis 05/25/2018   Osteoarthritis    Pain in right ankle and joints of right foot 10/05/2016   Palpitations 07/06/2017   Pernicious anemia    PONV (postoperative nausea and vomiting)    Preop cardiovascular exam 10/18/2023   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   Shoulder bursitis 10/12/2014   Injected in 10/12/2014  Repeat 01/21/2015   Small thenar eminence 02/03/2016   Spinal stenosis, lumbar region, without neurogenic claudication 10/12/2014   Spondylolisthesis at L5-S1 level 05/09/2021   Strain of latissimus dorsi muscle 01/21/2015   Tendinopathy of right biceps tendon 07/25/2019   Type 2 diabetes mellitus with hyperglycemia, without long-term current use of insulin  (HCC) 12/22/2013   Ulnar neuropathy at elbow of right upper extremity 01/03/2019   Past Surgical History:  Procedure Laterality Date   CATARACT EXTRACTION W/ INTRAOCULAR LENS IMPLANT Bilateral 2023   COLONOSCOPY     CORONARY PRESSURE/FFR STUDY N/A 06/23/2017   Procedure: INTRAVASCULAR PRESSURE WIRE/FFR STUDY;  Surgeon: Anner Alm ORN, MD;  Location: [facility] INVASIVE CV LAB;  Service: Cardiovascular;  Laterality: N/A;   CORONARY STENT INTERVENTION N/A 06/23/2017   Procedure: CORONARY STENT INTERVENTION;  Surgeon: Anner Alm ORN, MD;  Location: [facility] INVASIVE CV LAB;  Service: Cardiovascular;  Laterality: N/A;   KNEE ARTHROSCOPY Left    LEFT HEART CATH AND CORONARY ANGIOGRAPHY N/A 06/23/2017   Procedure: LEFT HEART CATH AND CORONARY ANGIOGRAPHY;  Surgeon: Anner Alm ORN, MD;  Location: [facility] INVASIVE CV LAB;  Service: Cardiovascular;  Laterality: N/A;   LEFT HEART CATH AND CORONARY ANGIOGRAPHY N/A 07/14/2023   Procedure: LEFT HEART CATH AND CORONARY ANGIOGRAPHY;  Surgeon: Elmira Newman PARAS, MD;   Location: [facility] INVASIVE CV LAB;  Service: Cardiovascular;  Laterality: N/A;   SHOULDER ARTHROSCOPY WITH BICEPSTENOTOMY Right 09/06/2019   Procedure: SHOULDER ARTHROSCOPY WITH BICEPSTENOTOMY;  Surgeon: Jerri Kay HERO, MD;  Location: [facility];  Service: Orthopedics;  Laterality: Right;   SHOULDER ARTHROSCOPY WITH SUBACROMIAL DECOMPRESSION Right 09/06/2019   Procedure: RIGHT SHOULDER ARTHROSCOPY WITH EXTENSIVE DEBRIDEMENT, SUBACROMIAL DECOMPRESSION, BICEPS TENOTOMY;  Surgeon: Jerri Kay HERO, MD;  Location: [facility];  Service: Orthopedics;  Laterality: Right;   TENOTOMY ACHILLES TENDON  2007   In [place], [place]   ULNAR NERVE TRANSPOSITION  2006   In [place], [place]   Patient Active Problem List   Diagnosis Date Noted   Injection site reaction 03/14/2024   Hypertension    PONV (postoperative nausea and vomiting)    Preop cardiovascular exam 10/18/2023   Pernicious anemia    Hallux rigidus, right foot 09/15/2021   Spondylolisthesis at L5-S1 level 05/09/2021   Low back pain at multiple sites 02/05/2021   Osteoarthritis    Heart murmur    Exercise-induced asthma    Diabetes mellitus without complication (HCC)    Arthritis    Anemia    Myofascial pain syndrome, cervical 07/28/2019   Lateral epicondylitis of right elbow 07/28/2019   Nontraumatic incomplete tear of right rotator cuff 07/25/2019   Tendinopathy of right biceps tendon 07/25/2019   Erectile dysfunction 07/11/2019   Mild persistent asthma/cough variant asthma 04/13/2019   Allergic rhinitis with a possible nonallergic component 04/13/2019   Acid reflux 04/13/2019   Chronic cough 04/13/2019   Hoarseness 04/13/2019   Cervical radicular pain 01/10/2019   Ulnar neuropathy at elbow of right upper extremity 01/03/2019   Acute bilateral low back pain without sciatica 01/03/2019   Neck pain 06/01/2018   Occipital headache 06/01/2018   Onychomycosis 05/25/2018   Palpitations 07/06/2017   CAD (coronary  artery disease) 07/05/2017   Family history of early CAD 07/05/2017   Abnormal findings on diagnostic imaging of cardiovascular system 06/23/2017   Low vitamin D  level 04/22/2017   Pain in right ankle and joints of right foot 10/05/2016   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   Hemarthrosis of right elbow 02/03/2016   Small thenar eminence 02/03/2016   Cervical disc disorder with radiculopathy of cervical region 10/07/2015   Incomplete rotator cuff tear 09/10/2015   Nonischemic cardiomyopathy (HCC) 05/15/2015   Fatigue 05/15/2015   Dyspnea on exertion 05/15/2015   Decreased cardiac ejection fraction 04/02/2015   Strain of latissimus dorsi muscle 01/21/2015   Shoulder bursitis 10/12/2014   Spinal stenosis, lumbar region, without neurogenic claudication 10/12/2014   BPPV (benign paroxysmal positional vertigo) 08/14/2014   Hyperlipidemia 01/19/2014   Anemia, unspecified 01/19/2014   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   Type 2 diabetes mellitus with hyperglycemia, without long-term current use of insulin  (HCC) 12/22/2013   Hypogonadism in male 12/22/2013   Arthritis of hand, left 12/22/2013    PCP: Frann Mabel Mt, DO  REFERRING PROVIDER: Colon Shove, MD  REFERRING DIAG: Spondylolisthesis, lumbosacral region [M43.17]   Rationale for Evaluation and Treatment: Rehabilitation  THERAPY DIAG:  Muscle weakness (generalized)  Chronic bilateral low back pain with right-sided sciatica  Other symptoms and signs involving the musculoskeletal system  ONSET DATE: 10/25/23 surgery date.  SUBJECTIVE:                                                                                                                                                                                           SUBJECTIVE STATEMENT: Pt reports he dreads bedtime as he gets sharp pains when he rolls over in bed.  Overall he reports improvement in his mobility.      Eval: Pt was playing  pickleball at [place] in January 2025l. He reports having pain following game and was unable to walk. Started having legs give way. Pain has been consistent prior/ after surgery, but no longer shooting pain. He also noted numbness in bilat LE's into feet. He states that numbness is new. My doctor says  I cannot do anymore damage at this point, He also said not to twist, I dont twist anymore as it flares everything up. I have tried walking on the beach and on uneven surfaces and it continues to cause a lot of pain and difficulty.   PERTINENT HISTORY:  Cardiac, Acid reflux, Anemia, Arthritis, ADD, BPPV, CAD, DM, Dyspnea on exertion, Osteoarthritis.     PAIN:  Are you having pain? Yes: NPRS scale: 6/10 Pain location: center of back dull ache and into both hips Pain description: Achy pain, sharp pain Aggravating factors: twisting, walking on uneven surfaces.  Relieving factors: Resting.   PRECAUTIONS: None  RED FLAGS: None   WEIGHT BEARING RESTRICTIONS: No  FALLS:  Has patient fallen in last 6 months? No  LIVING ENVIRONMENT: Lives with: lives with their family Lives in: House/apartment Stairs: 24 stairs internally, 3 externally.  Has following equipment at home: None  OCCUPATION: Retired   PLOF: Independent  PATIENT GOALS: Pt would like to get back to walking.   NEXT MD VISIT: None currently.   OBJECTIVE:  Note: Objective measures were completed at Evaluation unless otherwise noted.  DIAGNOSTIC FINDINGS:  1. Status post L4-S1 discectomy and fusion without evidence of complication. The central canal and foramina appear widely patent at the surgical levels. 2. Mild to moderate central canal narrowing at L2-3 and L3-4. 3. Aortic atherosclerosis.   Aortic Atherosclerosis (ICD10-I70.0).  PATIENT SURVEYS:  Oswestry: 21/50 04/03/24: 18/50  COGNITION: Overall cognitive status: Within functional limits for tasks assessed     SENSATION: WFL   POSTURE: rounded  shoulders and forward head  PALPATION: Tenderness across lower back.   LUMBAR ROM:   AROM eval 04/03/24  Flexion 15%* hip  flexion mostly.    Extension 10%  No change no pain  Right lateral flexion Pt denies movement due to pain 90% limited pain  Left lateral flexion Pt denies movement due to pain 75% limited no pain  Right rotation Pt denies movement due to pain Full no pain  Left rotation Pt denies movement due to pain Full no pain   (Blank rows = not tested)  LOWER EXTREMITY ROM:     Active  Right eval Left eval R / L 04/03/24  Hip flexion WFL with reports of pain in lower back  [facility] with reports of pain in lower back  Full no pain  Hip extension     Hip abduction     Hip adduction     Hip internal rotation     Hip external rotation [facility]   Knee flexion [facility]  [facility]    Knee extension WFL with reports of pain in lower back [facility] with reports of pain in lower back No pain   (Blank rows = not tested)  LOWER EXTREMITY MMT:    MMT Right eval Left eval  Hip flexion    Hip extension    Hip abduction    Hip adduction    Hip internal rotation    Hip external rotation 4 4  Knee flexion    Knee extension     (Blank rows = not tested)   FUNCTIONAL TESTS:  5 times sit to stand: 26.22 seconds.  04/03/24: 18.58  GAIT: Distance walked: 97ft   Assistive device utilized: None Level of assistance: Complete Independence Comments: Pt walks with a slow antalgic gait. Minimal rotation.  04/03/24: improved cadence, offloading left/antalgic  TREATMENT DATE:  [facility] Adult PT Treatment:                                             Date: 04/11/24 Pt seen for aquatic therapy today.  Treatment took place in water 3.5-4.75 ft in depth at the [facility] pool. Temp of water was 91.  Pt entered/exited the pool via stairs independently with bil rail.  - unsupported walking forward/ backward and side stepping - side stepping with arm add/abdct with yellow hand floats->- side step into  wide squat with arm add/abdct with yellow hand floats - UE on yellow hand floats: leg swings into hip flex/extension x10; hip abdct/ add crossing midline x 10 (good balance challenge) - return to walking forward/ backward -> marching forward/backward-> with suitcase carry and bil yellow under water - staggered stance with TrA set and long hollow noodle pull down to thighs x 10 each LE forward, (good balance challenge) - forward walking kicks - wide stance with gentle trunk rotation arms out to side -plank at bench in water with alternating hip extension x 5 each; fire hydrant hips x 5 each - L stretch with gentle tail wag  - figure 4 stretch at steps, 30sec x 2 each LE - STS from 3rd step x 2x5 cues for neutral head and forward arm reach, control descent - straddling noodle with UE support on corner: cycling; hip abdct/ add; hip flexion/ext -> extra noodle under arms and no UE support- repeated all 3 exercises  - R/L hamstring stretch with heel on 2nd step and back straight 20 sec each -> swivel on standing foot for adductor stretch   [facility] Adult PT Treatment:  Date: 04/03/24 Pt seen for aquatic therapy today.  Treatment took place in water 3.5-4.75 ft in depth at the [facility] pool. Temp of water was 91.  Pt entered/exited the pool via stairs independently with bil rail.  - unsupported walking forward/ backward and side stepping - side stepping with arm add/abdct with rainbow hand floats->- side step into wide squat with arm add/abdct with yellow hand floats - L stretch with gentle tail wag  - STS from 3rd step x 2x5 cues for neutral head and forward arm reach, control descent - figure 4 stretch at steps - UE On wall: leg swings into hip flex/extension x10; hip abdct/ add x 10  - straddling noodle with UE support on corner: cycling  - R/L hamstring stretch with heel on 2nd step and back straight 20 sec each -> swivel on standing foot for  adductor stretch  [facility] Adult PT Treatment:                                             Date: 03/28/24 Pt seen for aquatic therapy today.  Treatment took place in water 3.5-4.75 ft in depth at the [facility] pool. Temp of water was 91.  Pt entered/exited the pool via stairs independently with bil rail.  - unsupported walking forward/ backward and side stepping - side stepping with arm add/abdct with rainbow hand floats  - L stretch with gentle tail wag  - UE on wall:split squats x 5 each LE forward ->  forward walking split squats with yellow hand floats - side step into wide squat with arm add/abdct with yellow hand floats - STS from 3rd step x 8, cues for neutral head and forward arm reach, control descent - UE On wall: leg swings into hip flex/extension x10; hip abdct/ add x 10  - straddling noodle with UE support on corner: cycling - R/L hamstring stretch with heel on 2nd step and back straight 20 sec each -> swivel on standing foot for adductor stretch    PATIENT EDUCATION:  Education details: Educated pt on anatomy and physiology of current symptoms, intro to aquatic therapy Person educated: Patient Education method: Programmer, Multimedia, Demonstration Education comprehension: verbalized understanding and returned demonstration  HOME EXERCISE PROGRAM: Access Code: [email or web address] URL: [email or web address] Date: 02/21/2024 Prepared by: Rojean Batten  Exercises - Supine Lower Trunk Rotation  - 1-2 x daily - 7 x weekly - 2-3 sets - 10 reps - Supine Hip Adduction Isometric with Ball  - 1-2 x daily - 7 x weekly - 2-3 sets - 10 reps   ASSESSMENT:  CLINICAL IMPRESSION:  Pt reported best relief with marching and suspended cycling.  Gradual elimination of pain while exercising in pool submerged 75%.  Pt is making good progress towards remaining goals.    PN: pt reports his pain with daily activities is down ~ 60% although he does continue to have pain as high as 6/10  during day.  Reports daily times without pain. He is not limited due to pain with amb functional and community distance but does continue to demonstrate an antalgic gait. Lumbar ROM improved without pain in most directions.  Muscle tightness persists in lumbar spine and hips. He is not using an AD. Overall he has made good progress. All STGs met.  Most LTGs in progress.  He will continue to benefit from skilled PT intervention progressing toward  meeting remaining set goals.   OBJECTIVE IMPAIRMENTS: decreased activity tolerance, difficulty walking, decreased balance, decreased endurance, decreased mobility, decreased ROM, decreased strength, impaired flexibility, impaired UE/LE use, postural dysfunction, and pain.  ACTIVITY LIMITATIONS: bending, lifting, carry, locomotion, cleaning, community activity, driving, and or occupation  PERSONAL FACTORS: Cardiac, Acid reflux, Anemia, Arthritis, ADD, BPPV, CAD, DM, Dyspnea on exertion, Osteoarthritis.  are also affecting patient's functional outcome.  REHAB POTENTIAL: Good  CLINICAL DECISION MAKING: Evolving/moderate complexity  EVALUATION COMPLEXITY: Moderate    GOALS: Short term PT Goals Target date: 03/06/24 Pt will be I and compliant with HEP. Baseline:  Goal status: Met 03/24/24 Pt will decrease pain by 25% overall Baseline: Goal status: Met 03/24/24  Long term PT goals Target date: 04/24/2024 Pt will improve ROM to [facility] to improve functional mobility Baseline: Goal status: Improving 04/03/24 Pt will improve hip/knee strength to at least 5-/5 MMT to improve functional strength Baseline: Goal status: New Pt will improve OSWESTRY to at least % functional to show improved function Baseline: Goal status: New Pt will reduce pain by overall 50% overall with usual activity Baseline: Goal status: MET 04/03/24 Pt will reduce pain to overall less than 3-4/10 with usual activity and work activity. Baseline: Goal status: In progress  04/03/24 Pt will be able to ambulate on uneven surfaces at least 500 ft WNL gait pattern without complaints Baseline: Goal status: In progress 04/03/24   7. Pt will be able to perform SLS for 30 seconds on each leg without LOB.  Baseline:   Goal status: New  PLAN: PT FREQUENCY: 1-3 times per week   PT DURATION: 6-8 weeks  PLANNED INTERVENTIONS (unless contraindicated): aquatic PT, Canalith repositioning, cryotherapy, Electrical stimulation, Iontophoresis with 4 mg/ml dexamethasome, Moist heat, traction, Ultrasound, gait training, Therapeutic exercise, balance training, neuromuscular re-education, patient/family education, prosthetic training, manual techniques, passive ROM, dry needling, taping, vasopnuematic device, vestibular, spinal manipulations, joint manipulations  PLAN FOR NEXT SESSION: Assess HEP/update PRN, continue to progress functional mobility, work on balance, work on designer, jewellery.  Core control.   Delon Aquas, PTA 04/11/24 10:57 AM [facility] [address] [place], [place], [address] Phone: [phone]   Fax:  [phone]

## 2024-04-12 ENCOUNTER — Other Ambulatory Visit

## 2024-04-12 ENCOUNTER — Ambulatory Visit (INDEPENDENT_AMBULATORY_CARE_PROVIDER_SITE_OTHER)

## 2024-04-12 DIAGNOSIS — E538 Deficiency of other specified B group vitamins: Secondary | ICD-10-CM | POA: Diagnosis not present

## 2024-04-12 MED ORDER — CYANOCOBALAMIN 1000 MCG/ML IJ SOLN
1000.0000 ug | Freq: Once | INTRAMUSCULAR | Status: AC
  Administered 2024-04-12: 1000 ug via INTRAMUSCULAR

## 2024-04-12 NOTE — Progress Notes (Signed)
 Patient is here for biweekly B12 injection per original order dated: Patient is here for is 1st Biweekly injection per Dr. Frann, Erskin Lenis- Thanks for coming in for labs. Have you been receiving B12 injections lately? It is low again. 03/28/2024  Last B12 injection:03/29/2024  Last B12 level:  03/28/2024  B12 1000mcg given IM, right deltoid and patient tolerated injection well.  Next B12 injection scheduled for: Biweekly 04/25/2024 @ 1045am

## 2024-04-13 DIAGNOSIS — E1165 Type 2 diabetes mellitus with hyperglycemia: Secondary | ICD-10-CM | POA: Diagnosis not present

## 2024-04-13 LAB — COMPREHENSIVE METABOLIC PANEL WITH GFR
AG Ratio: 1.5 (calc) (ref 1.0–2.5)
ALT: 15 U/L (ref 9–46)
AST: 18 U/L (ref 10–35)
Albumin: 4.3 g/dL (ref 3.6–5.1)
Alkaline phosphatase (APISO): 86 U/L (ref 35–144)
BUN: 16 mg/dL (ref 7–25)
CO2: 26 mmol/L (ref 20–32)
Calcium: 9.3 mg/dL (ref 8.6–10.3)
Chloride: 105 mmol/L (ref 98–110)
Creat: 0.72 mg/dL (ref 0.70–1.28)
Globulin: 2.9 g/dL (ref 1.9–3.7)
Glucose, Bld: 136 mg/dL — ABNORMAL HIGH (ref 65–99)
Potassium: 4.3 mmol/L (ref 3.5–5.3)
Sodium: 141 mmol/L (ref 135–146)
Total Bilirubin: 0.3 mg/dL (ref 0.2–1.2)
Total Protein: 7.2 g/dL (ref 6.1–8.1)
eGFR: 98 mL/min/1.73m2 (ref >=60)

## 2024-04-13 LAB — LIPID PANEL
Cholesterol: 116 mg/dL (ref <200)
HDL: 63 mg/dL (ref >=40)
LDL Cholesterol (Calc): 38 mg/dL
Non-HDL Cholesterol (Calc): 53 mg/dL (ref <130)
Total CHOL/HDL Ratio: 1.8 (calc) (ref <5.0)
Triglycerides: 66 mg/dL (ref <150)

## 2024-04-13 LAB — HEMOGLOBIN A1C
Hgb A1c MFr Bld: 7.2 % — ABNORMAL HIGH (ref <5.7)
Mean Plasma Glucose: 160 mg/dL
eAG (mmol/L): 8.9 mmol/L

## 2024-04-14 ENCOUNTER — Ambulatory Visit (HOSPITAL_BASED_OUTPATIENT_CLINIC_OR_DEPARTMENT_OTHER): Admitting: Physical Therapy

## 2024-04-14 ENCOUNTER — Encounter (HOSPITAL_BASED_OUTPATIENT_CLINIC_OR_DEPARTMENT_OTHER): Payer: Self-pay | Admitting: Physical Therapy

## 2024-04-14 DIAGNOSIS — M6281 Muscle weakness (generalized): Secondary | ICD-10-CM

## 2024-04-14 DIAGNOSIS — G8929 Other chronic pain: Secondary | ICD-10-CM

## 2024-04-14 DIAGNOSIS — R29898 Other symptoms and signs involving the musculoskeletal system: Secondary | ICD-10-CM

## 2024-04-14 LAB — MICROALBUMIN / CREATININE URINE RATIO
Creatinine, Urine: 49 mg/dL (ref 20–320)
Microalb Creat Ratio: 14 mg/g{creat} (ref <30)
Microalb, Ur: 0.7 mg/dL

## 2024-04-14 NOTE — Therapy (Signed)
 OUTPATIENT PHYSICAL THERAPY THORACOLUMBAR TREATMENT  Patient Name: Reginald Rios MRN: [id number] DOB:1956-04-16, 68 y.o., male Today's Date: 04/14/2024  END OF SESSION:     PT End of Session - 04/14/24 1310     Visit Number 11    Date for Recertification  04/24/24    Authorization Type [employer]    Progress Note Due on Visit 19    PT Start Time 1300    PT Stop Time 1340    PT Time Calculation (min) 40 min    Behavior During Therapy [facility] for tasks assessed/performed           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   BPPV (benign paroxysmal positional vertigo) 08/14/2014   CAD (coronary artery disease)    2/19 PCI/DESx1 to Lcx   Cervical disc disorder with radiculopathy of cervical region 10/07/2015   Cervical radicular pain 01/10/2019   Chronic cough 04/13/2019   Decreased cardiac ejection fraction 04/02/2015   Diabetes mellitus without complication (HCC)    Dyspnea on exertion 05/15/2015   Erectile dysfunction 07/11/2019   Exercise-induced asthma    Family history of early CAD 07/05/2017   Fatigue 05/15/2015   Hallux rigidus, right foot 09/15/2021   Heart murmur    In early childhood. No issues now   Hemarthrosis of right elbow 02/03/2016   Hoarseness 04/13/2019   Hyperlipidemia    family hx of high cholesterol   Hypertension    Hypogonadism in male    Incomplete rotator cuff tear 09/10/2015   Injected 09/10/2015   Lateral epicondylitis of right elbow 07/28/2019   Low back pain at multiple sites 02/05/2021   Low vitamin D  level 04/22/2017   Mild persistent  asthma/cough variant asthma 04/13/2019   Myofascial pain syndrome, cervical 07/28/2019   Neck pain 06/01/2018   Nonischemic cardiomyopathy (HCC) 05/15/2015   Nontraumatic incomplete tear of right rotator cuff 07/25/2019   Occipital headache 06/01/2018   Onychomycosis 05/25/2018   Osteoarthritis    Pain in right ankle and joints of right foot 10/05/2016   Palpitations 07/06/2017   Pernicious anemia    PONV (postoperative nausea and vomiting)    Preop cardiovascular exam 10/18/2023   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   Shoulder bursitis 10/12/2014   Injected in 10/12/2014  Repeat 01/21/2015   Small thenar eminence 02/03/2016   Spinal stenosis, lumbar region, without neurogenic claudication 10/12/2014   Spondylolisthesis at L5-S1 level 05/09/2021   Strain of latissimus dorsi muscle 01/21/2015   Tendinopathy of right biceps tendon 07/25/2019   Type 2 diabetes mellitus with hyperglycemia, without long-term current use of insulin  (HCC) 12/22/2013   Ulnar neuropathy at elbow of right upper extremity 01/03/2019   Past Surgical History:  Procedure Laterality Date   CATARACT EXTRACTION W/ INTRAOCULAR LENS IMPLANT Bilateral 2023   COLONOSCOPY     CORONARY PRESSURE/FFR STUDY N/A 06/23/2017   Procedure: INTRAVASCULAR PRESSURE WIRE/FFR STUDY;  Surgeon: Anner Alm ORN, MD;  Location: [facility] INVASIVE CV [facility];  Service: Cardiovascular;  Laterality: N/A;   CORONARY STENT INTERVENTION N/A 06/23/2017   Procedure: CORONARY STENT INTERVENTION;  Surgeon: Anner Alm ORN, MD;  Location:  [facility];  Service: Cardiovascular;  Laterality: N/A;   KNEE ARTHROSCOPY Left    LEFT HEART CATH AND CORONARY ANGIOGRAPHY N/A 06/23/2017   Procedure: LEFT HEART CATH AND CORONARY ANGIOGRAPHY;  Surgeon: Anner Alm ORN, MD;  [facility]: [facility] INVASIVE CV LAB;  Service: Cardiovascular;  Laterality: N/A;   LEFT HEART CATH AND CORONARY ANGIOGRAPHY N/A 07/14/2023   Procedure: LEFT HEART CATH AND CORONARY ANGIOGRAPHY;  Surgeon:  Elmira Newman PARAS, MD;  Location: [facility] INVASIVE CV [facility];  Service: Cardiovascular;  Laterality: N/A;   SHOULDER ARTHROSCOPY WITH BICEPSTENOTOMY Right 09/06/2019   Procedure: SHOULDER ARTHROSCOPY WITH BICEPSTENOTOMY;  Surgeon: Jerri Kay HERO, MD;  Location: [facility];  Service: Orthopedics;  Laterality: Right;   SHOULDER ARTHROSCOPY WITH SUBACROMIAL DECOMPRESSION Right 09/06/2019   Procedure: RIGHT SHOULDER ARTHROSCOPY WITH EXTENSIVE DEBRIDEMENT, SUBACROMIAL DECOMPRESSION, BICEPS TENOTOMY;  Surgeon: Jerri Kay HERO, MD;  Location:  SURGERY CENTER;  Service: Orthopedics;  Laterality: Right;   TENOTOMY ACHILLES TENDON  2007   In [place], [place]   ULNAR NERVE TRANSPOSITION  2006   In [place], [place]   Patient Active Problem List   Diagnosis Date Noted   Injection site reaction 03/14/2024   Hypertension    PONV (postoperative nausea and vomiting)    Preop cardiovascular exam 10/18/2023   Pernicious anemia    Hallux rigidus, right foot 09/15/2021   Spondylolisthesis at L5-S1 level 05/09/2021   Low back pain at multiple sites 02/05/2021   Osteoarthritis    Heart murmur    Exercise-induced asthma    Diabetes mellitus without complication (HCC)    Arthritis    Anemia    Myofascial pain syndrome, cervical 07/28/2019   Lateral epicondylitis of right elbow 07/28/2019   Nontraumatic incomplete tear of right rotator cuff 07/25/2019   Tendinopathy of right biceps tendon 07/25/2019   Erectile dysfunction 07/11/2019   Mild persistent asthma/cough variant asthma 04/13/2019   Allergic rhinitis with a possible nonallergic component 04/13/2019   Acid reflux 04/13/2019   Chronic cough 04/13/2019   Hoarseness 04/13/2019   Cervical radicular pain 01/10/2019   Ulnar neuropathy at elbow of right upper extremity 01/03/2019   Acute bilateral low back pain without sciatica 01/03/2019   Neck pain 06/01/2018   Occipital headache 06/01/2018   Onychomycosis 05/25/2018   Palpitations  07/06/2017   CAD (coronary artery disease) 07/05/2017   Family history of early CAD 07/05/2017   Abnormal findings on diagnostic imaging of cardiovascular system 06/23/2017   Low vitamin D  level 04/22/2017   Pain in right ankle and joints of right foot 10/05/2016   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   Hemarthrosis of right elbow 02/03/2016   Small thenar eminence 02/03/2016   Cervical disc disorder with radiculopathy of cervical region 10/07/2015   Incomplete rotator cuff tear 09/10/2015   Nonischemic cardiomyopathy (HCC) 05/15/2015   Fatigue 05/15/2015   Dyspnea on exertion 05/15/2015   Decreased cardiac ejection fraction 04/02/2015   Strain of latissimus dorsi muscle 01/21/2015   Shoulder bursitis 10/12/2014   Spinal stenosis, lumbar region, without neurogenic claudication 10/12/2014   BPPV (benign paroxysmal positional vertigo) 08/14/2014   Hyperlipidemia 01/19/2014   Anemia, unspecified 01/19/2014   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   Type 2 diabetes mellitus with hyperglycemia, without long-term current use of insulin  (HCC) 12/22/2013   Hypogonadism in male 12/22/2013   Arthritis of hand, left 12/22/2013    PCP: Frann Mabel Mt, DO  REFERRING PROVIDER: Colon Shove, MD  REFERRING DIAG: Spondylolisthesis, lumbosacral region [M43.17]  Rationale for Evaluation and Treatment: Rehabilitation  THERAPY DIAG:  Muscle weakness (generalized)  Chronic bilateral low back pain with right-sided sciatica  Other symptoms and signs involving the musculoskeletal system  ONSET DATE: 10/25/23 surgery date.  SUBJECTIVE:                                                                                                                                                                                           SUBJECTIVE STATEMENT: Pt reports good relief of symptoms while exercising in water. Pt voices interest in returning to yoga class. He has not gotten  into pool outside of therapy sessions yet.      Eval: Pt was playing pickleball at [place] in January 2025l. He reports having pain following game and was unable to walk. Started having legs give way. Pain has been consistent prior/ after surgery, but no longer shooting pain. He also noted numbness in bilat LE's into feet. He states that numbness is new. My doctor says  I cannot do anymore damage at this point, He also said not to twist, I don't twist anymore as it flares everything up. I have tried walking on the beach and on uneven surfaces and it continues to cause a lot of pain and difficulty.   PERTINENT HISTORY:  Cardiac, Acid reflux, Anemia, Arthritis, ADD, BPPV, CAD, DM, Dyspnea on exertion, Osteoarthritis.     PAIN:  Are you having pain? Yes: NPRS scale: 6/10 Pain location: center of back dull ache and into both hips Pain description: Achy pain, sharp pain Aggravating factors: twisting, walking on uneven surfaces.  Relieving factors: Resting.   PRECAUTIONS: None  RED FLAGS: None   WEIGHT BEARING RESTRICTIONS: No  FALLS:  Has patient fallen in last 6 months? No  LIVING ENVIRONMENT: Lives with: lives with their family Lives in: House/apartment Stairs: 24 stairs internally, 3 externally.  Has following equipment at home: None  OCCUPATION: Retired   PLOF: Independent  PATIENT GOALS: Pt would like to get back to walking.   NEXT MD VISIT: None currently.   OBJECTIVE:  Note: Objective measures were completed at Evaluation unless otherwise noted.  DIAGNOSTIC FINDINGS:  1. Status post L4-S1 discectomy and fusion without evidence of complication. The central canal and foramina appear widely patent at the surgical levels. 2. Mild to moderate central canal narrowing at L2-3 and L3-4. 3. Aortic atherosclerosis.   Aortic Atherosclerosis (ICD10-I70.0).  PATIENT SURVEYS:  Oswestry: 21/50 04/03/24: 18/50  COGNITION: Overall cognitive status: Within functional  limits for tasks assessed     SENSATION: WFL   POSTURE: rounded shoulders and forward head  PALPATION: Tenderness across lower back.  LUMBAR ROM:   AROM eval 04/03/24  Flexion 15%* hip flexion mostly.    Extension 10%  No change no pain  Right lateral flexion Pt denies movement due to pain 90% limited pain  Left lateral flexion Pt denies movement due to pain 75% limited no pain  Right rotation Pt denies movement due to pain Full no pain  Left rotation Pt denies movement due to pain Full no pain   (Blank rows = not tested)  LOWER EXTREMITY ROM:     Active  Right eval Left eval R / L 04/03/24  Hip flexion WFL with reports of pain in lower back  [facility] with reports of pain in lower back  Full no pain  Hip extension     Hip abduction     Hip adduction     Hip internal rotation     Hip external rotation [facility]   Knee flexion [facility]    Knee extension WFL with reports of pain in lower back [facility] with reports of pain in lower back No pain   (Blank rows = not tested)  LOWER EXTREMITY MMT:    MMT Right eval Left eval  Hip flexion    Hip extension    Hip abduction    Hip adduction    Hip internal rotation    Hip external rotation 4 4  Knee flexion    Knee extension     (Blank rows = not tested)   FUNCTIONAL TESTS:  5 times sit to stand: 26.22 seconds.  04/03/24: 18.58  GAIT: Distance walked: 44ft   Assistive device utilized: None Level of assistance: Complete Independence Comments: Pt walks with a slow antalgic gait. Minimal rotation.  04/03/24: improved cadence, offloading left/antalgic  TREATMENT DATE:  [facility] Adult PT Treatment:                                             Date: 04/14/24 Pt seen for aquatic therapy today.  Treatment took place in water 3.5-4.75 ft in depth at the [facility] pool. Temp of water was 91.  Pt entered/exited the pool via stairs independently with bil rail.  - unsupported walking forward/ backward  - side stepping with arm  add/abdct with rainbow hand floats->- side step into wide squat  - UE on rainbow hand floats: leg swings into hip flex/extension 2x10; hip abdct/ add  x 10 (good balance challenge) - marching forward/backward with reciprocal row motion with rainbow hand floats - STS from 3rd step x 10 cues for neutral head and forward arm reach, control descent - suitcase carry with bil/single rainbow under water - marching forward/backward with reciprocal row motion with rainbow hand floats - wood chop motion with UE (no floats) x 6 each direction - straddling noodle extra noodle under arms and no UE support: cycling - R/L hamstring stretch with heel on 2nd step and back straight 20 sec each -> swivel on standing foot for adductor stretch;  figure 4 stretch at steps, 15 sec x 2 each LE  [facility] Adult PT Treatment:                                             Date: 04/11/24 Pt seen for aquatic therapy today.  Treatment  took place in water 3.5-4.75 ft in depth at the [facility] pool. Temp of water was 91.  Pt entered/exited the pool via stairs independently with bil rail.  - unsupported walking forward/ backward and side stepping - side stepping with arm add/abdct with yellow hand floats->- side step into wide squat with arm add/abdct with yellow hand floats - UE on yellow hand floats: leg swings into hip flex/extension x10; hip abdct/ add crossing midline x 10 (good balance challenge) - return to walking forward/ backward -> marching forward/backward-> with suitcase carry and bil yellow under water - staggered stance with TrA set and long hollow noodle pull down to thighs x 10 each LE forward, (good balance challenge) - forward walking kicks - wide stance with gentle trunk rotation arms out to side -plank at bench in water with alternating hip extension x 5 each; fire hydrant hips x 5 each - L stretch with gentle tail wag  - figure 4 stretch at steps, 30sec x 2 each LE - STS from 3rd step x 2x5 cues for  neutral head and forward arm reach, control descent - straddling noodle with UE support on corner: cycling; hip abdct/ add; hip flexion/ext -> extra noodle under arms and no UE support- repeated all 3 exercises  - R/L hamstring stretch with heel on 2nd step and back straight 20 sec each -> swivel on standing foot for adductor stretch   [facility] Adult PT Treatment:                                             Date: 04/03/24 Pt seen for aquatic therapy today.  Treatment took place in water 3.5-4.75 ft in depth at the [facility] pool. Temp of water was 91.  Pt entered/exited the pool via stairs independently with bil rail.  - unsupported walking forward/ backward and side stepping - side stepping with arm add/abdct with rainbow hand floats->- side step into wide squat with arm add/abdct with yellow hand floats - L stretch with gentle tail wag  - STS from 3rd step x 2x5 cues for neutral head and forward arm reach, control descent - figure 4 stretch at steps - UE On wall: leg swings into hip flex/extension x10; hip abdct/ add x 10  - straddling noodle with UE support on corner: cycling  - R/L hamstring stretch with heel on 2nd step and back straight 20 sec each -> swivel on standing foot for adductor stretch  [facility] Adult PT Treatment:                                             Date: 03/28/24 Pt seen for aquatic therapy today.  Treatment took place in water 3.5-4.75 ft in depth at the [facility] pool. Temp of water was 91.  Pt entered/exited the pool via stairs independently with bil rail.  - unsupported walking forward/ backward and side stepping - side stepping with arm add/abdct with rainbow hand floats  - L stretch with gentle tail wag  - UE on wall:split squats x 5 each LE forward ->  forward walking split squats with yellow hand floats - side step into wide squat with arm add/abdct with yellow hand floats - STS from 3rd step x 8, cues for  neutral head and forward arm reach,  control descent - UE On wall: leg swings into hip flex/extension x10; hip abdct/ add x 10  - straddling noodle with UE support on corner: cycling - R/L hamstring stretch with heel on 2nd step and back straight 20 sec each -> swivel on standing foot for adductor stretch    PATIENT EDUCATION:  Education details: Educated pt on anatomy and physiology of current symptoms, intro to aquatic therapy Person educated: Patient Education method: Programmer, Multimedia, Demonstration Education comprehension: verbalized understanding and returned demonstration  HOME EXERCISE PROGRAM: Access Code: [email or web address] URL: [email or web address] Date: 02/21/2024 Prepared by: Rojean Batten  Exercises - Supine Lower Trunk Rotation  - 1-2 x daily - 7 x weekly - 2-3 sets - 10 reps - Supine Hip Adduction Isometric with Ball  - 1-2 x daily - 7 x weekly - 2-3 sets - 10 reps   ASSESSMENT:  CLINICAL IMPRESSION:  Pt reported best relief with wide side squats, marching, and suspended cycling.  Gradual reduction of pain to 3/10 while exercising in pool submerged 75%. Will plan to create aquatic HEP next visit. Pt voiced interest in returning to land based exercise; anticipate transition with land/water at end of cert.  Pt is making good progress towards remaining goals.    Progress note: pt reports his pain with daily activities is down ~ 60% although he does continue to have pain as high as 6/10 during day.  Reports daily times without pain. He is not limited due to pain with amb functional and community distance but does continue to demonstrate an antalgic gait. Lumbar ROM improved without pain in most directions.  Muscle tightness persists in lumbar spine and hips. He is not using an AD. Overall he has made good progress. All STGs met.  Most LTGs in progress.  He will continue to benefit from skilled PT intervention progressing toward meeting remaining set goals.   OBJECTIVE IMPAIRMENTS: decreased activity  tolerance, difficulty walking, decreased balance, decreased endurance, decreased mobility, decreased ROM, decreased strength, impaired flexibility, impaired UE/LE use, postural dysfunction, and pain.  ACTIVITY LIMITATIONS: bending, lifting, carry, locomotion, cleaning, community activity, driving, and or occupation  PERSONAL FACTORS: Cardiac, Acid reflux, Anemia, Arthritis, ADD, BPPV, CAD, DM, Dyspnea on exertion, Osteoarthritis.  are also affecting patient's functional outcome.  REHAB POTENTIAL: Good  CLINICAL DECISION MAKING: Evolving/moderate complexity  EVALUATION COMPLEXITY: Moderate    GOALS: Short term PT Goals Target date: 03/06/24 Pt will be I and compliant with HEP. Baseline:  Goal status: Met 03/24/24 Pt will decrease pain by 25% overall Baseline: Goal status: Met 03/24/24  Long term PT goals Target date: 04/24/2024 Pt will improve ROM to [facility] to improve functional mobility Baseline: Goal status: Improving 04/03/24 Pt will improve hip/knee strength to at least 5-/5 MMT to improve functional strength Baseline: Goal status: New Pt will improve OSWESTRY to at least % functional to show improved function Baseline: Goal status: New Pt will reduce pain by overall 50% overall with usual activity Baseline: Goal status: MET 04/03/24 Pt will reduce pain to overall less than 3-4/10 with usual activity and work activity. Baseline: Goal status: In progress 04/03/24 Pt will be able to ambulate on uneven surfaces at least 500 ft WNL gait pattern without complaints Baseline: Goal status: In progress 04/03/24   7. Pt will be able to perform SLS for 30 seconds on each leg without LOB.  Baseline:   Goal status: New  PLAN: PT FREQUENCY: 1-3 times per week   PT DURATION: 6-8  weeks  PLANNED INTERVENTIONS (unless contraindicated): aquatic PT, Canalith repositioning, cryotherapy, Electrical stimulation, Iontophoresis with 4 mg/ml dexamethasome, Moist heat, traction, Ultrasound, gait  training, Therapeutic exercise, balance training, neuromuscular re-education, patient/family education, prosthetic training, manual techniques, passive ROM, dry needling, taping, vasopnuematic device, vestibular, spinal manipulations, joint manipulations  PLAN FOR NEXT SESSION: Assess HEP/update PRN, continue to progress functional mobility, work on balance, work on designer, jewellery.  Core control.   Delon Aquas, PTA 04/14/2024 1:57 PM [facility] [address] [place], [place], [address] Phone: [phone]   Fax:  [phone]

## 2024-04-17 ENCOUNTER — Encounter: Payer: Self-pay | Admitting: Dietician

## 2024-04-17 ENCOUNTER — Encounter: Admitting: Dietician

## 2024-04-17 DIAGNOSIS — E1165 Type 2 diabetes mellitus with hyperglycemia: Secondary | ICD-10-CM | POA: Diagnosis present

## 2024-04-17 NOTE — Progress Notes (Signed)
 Diabetes Self-Management Education  Visit Type: Follow-up  Appt. Start Time: 1400 Appt. End Time: 1445  04/17/2024  [name], identified by name and date of birth, is a 68 y.o. male with a diagnosis of Diabetes:  .   ASSESSMENT  Patient is here today alone.  He was last seen by this RD on 12/20/2023.    He states that he has made many changes.  Eats fruit when he is craving something sweet. He is trying to cut out carbohydrates. He continues PT and pain is much better controlled (4 rather than 8).  He plans to try pickle ball. He is not sleeping well as he hurts when he turns over. Trying to avoid Caffeine after dinner. Now walking his dog twice per week.  Working in the garage and yard frequently. Fasting blood glucose higher in the 150-160's frequently.  This has increased from 1 month ago.  Discussed eating earlier as well as lower fat meals.  He has anemia and found to have some blood in his stool.  He states that he will be seeing a gastroenterologist. Appetite still lower post op but notes that his Ritalin  does decrease this as well. 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 Focused on importance of balanced carb meals to avoid excessive carb intake overnight and provide sufficient energy for his active hours.  Referral:  Type 2 Diabetes   History includes:  Type 2 Diabetes (1993), back/neck issues, hoarse, HLD, neuropathy, GERD, ADD, pernicious anemia, iron deficient anemia Medications includes:  Jardiance , glyburide  (prn when glucose is >200), Metformin -XR, Pioglitizone, calcium  with D3, Ritalin , Iron, MVI, ritalin , gabapentin , Vitamin B-12 shots every 2 weeks. Labs noted to include:  A1C 7.2% 04/12/2024 and 10/11/2023 decreased from 9.3% 07/08/2023 increased 8% 01/26/2023, Vitamin B-12 was 110 on 07/05/23 and increased to 1537 on 08/09/2023 after a B-12 injection then decreased to 205 on 03/27/2024, Hemoglobin 9.2 on 03/27/2024,  Vitamin D  28 on 07/05/2023, positive intrinsic factor 08/09/2023. Lipid Panel          Component Value Date/Time    CHOL 114 04/13/2023 0900    CHOL 156 04/22/2022 1439    TRIG 67.0 04/13/2023 0900    HDL 45.00 04/13/2023 0900    HDL 53 04/22/2022 1439    CHOLHDL 3 04/13/2023 0900    VLDL 13.4 04/13/2023 0900    LDLCALC 55 04/13/2023 0900    LDLCALC 89 04/22/2022 1439    LABVLDL 14 04/22/2022 1439    Estimated protein needs 90-100 grams per day.   68 180 lbs 04/17/2024 175 lbs 12/20/2023 197 lbs 10/18/2023  200 lbs 08/16/2024 210 lbs 02/2023   Patient lives with his wife and 2 adult children.  His wife is a therapist, sports for [employer].  She does the shopping and they get [employer] and patient cooks these meals.  They are choose more plant based and less carbs.  Avoids added salt.  Avoids eating out. Was playing pickle ball 3 times per week, walking dog 2 times per day, and yoga twice per week prior to increased back surgery and has since decreased this to 1-2 times per week. 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 Pain is much more controlled.    Diabetes Self-Management Education - 04/17/24 1729       Visit Information   Visit Type Follow-up      Psychosocial Assessment   Patient Belief/Attitude about Diabetes Motivated to manage  diabetes    What is the hardest part about your diabetes right now, causing you the most concern, or is the most worrisome to you about your diabetes?   Making healty food and beverage choices    Self-care barriers None    Self-management support Doctor's office;CDE visits;Family    Other persons present Patient    Patient Concerns Nutrition/Meal planning    Special Needs None    Preferred Learning Style No preference indicated    Learning Readiness Ready    How often do you need to have someone help you when you read instructions, pamphlets, or other written materials from your doctor or pharmacy? 1 - Never       Pre-Education Assessment   Patient understands the diabetes disease and treatment process. Demonstrates understanding / competency    Patient understands incorporating nutritional management into lifestyle. Comprehends key points    Patient undertands incorporating physical activity into lifestyle. Demonstrates understanding / competency    Patient understands using medications safely. Demonstrates understanding / competency    Patient understands monitoring blood glucose, interpreting and using results Demonstrates understanding / competency    Patient understands prevention, detection, and treatment of acute complications. Demonstrates understanding / competency    Patient understands prevention, detection, and treatment of chronic complications. Demonstrates understanding / competency    Patient understands how to develop strategies to address psychosocial issues. Demonstrates understanding / competency    Patient understands how to develop strategies to promote health/change behavior. Needs Review      Complications   Last HgB A1C per patient/outside source 7.2 %   04/12/2024 stable from 10/11/2023   How often do you check your blood sugar? 1-2 times/day    Fasting Blood glucose range (mg/dL) 869-820    Postprandial Blood glucose range (mg/dL) 869-820    Number of hypoglycemic episodes per month 0      Dietary Intake   Breakfast 2 eggs    Lunch Progresso tomato soup and a bagel    Snack (afternoon) instant soup (miso)    Dinner fried chicken, potato wedges, and corn    Snack (evening) nuts, fruit, buttered popcorn, occasional low sugar cookie    Beverage(s) water, mio, diet coke (1 daily), hot tea with splenda      Activity / Exercise   Activity / Exercise Type Light (walking / raking leaves)    How many days per week do you exercise? 7    How many minutes per day do you exercise? 30    Total minutes per week of exercise 210      Patient Education   Previous Diabetes Education Yes     Healthy Eating Meal options for control of blood glucose level and chronic complications.;Reviewed blood glucose goals for pre and post meals and how to evaluate the patients' food intake on their blood glucose level.    Being Active Role of exercise on diabetes management, blood pressure control and cardiac health.;Helped patient identify appropriate exercises in relation to his/her diabetes, diabetes complications and other health issue.    Medications Reviewed patients medication for diabetes, action, purpose, timing of dose and side effects.    Diabetes Stress and Support Identified and addressed patients feelings and concerns about diabetes;Worked with patient to identify barriers to care and solutions      Individualized Goals (developed by patient)   Nutrition General guidelines for healthy choices and portions discussed;Follow meal plan discussed    Physical Activity Exercise 5-7 days per week;30 minutes per  day    Medications take my medication as prescribed    Monitoring  Test my blood glucose as discussed    Problem Solving Addressing barriers to behavior change    Reducing Risk examine blood glucose patterns;treat hypoglycemia with 15 grams of carbs if blood glucose less than 70mg /dL;do foot checks daily      Patient Self-Evaluation of Goals - Patient rates self as meeting previously set goals (% of time)   Nutrition 50 - 75 % (half of the time)    Physical Activity >75% (most of the time)    Medications >75% (most of the time)    Monitoring >75% (most of the time)    Problem Solving and behavior change strategies  >75% (most of the time)    Reducing Risk (treating acute and chronic complications) >75% (most of the time)    Health Coping >75% (most of the time)      Post-Education Assessment   Patient understands the diabetes disease and treatment process. Demonstrates understanding / competency    Patient understands incorporating nutritional management into lifestyle.  Comprehends key points    Patient undertands incorporating physical activity into lifestyle. Demonstrates understanding / competency    Patient understands using medications safely. Demonstrates understanding / competency    Patient understands monitoring blood glucose, interpreting and using results Demonstrates understanding / competency    Patient understands prevention, detection, and treatment of acute complications. Demonstrates understanding / competency    Patient understands prevention, detection, and treatment of chronic complications. Demonstrates understanding / competency    Patient understands how to develop strategies to address psychosocial issues. Demonstrates understanding / competency    Patient understands how to develop strategies to promote health/change behavior. Comprehends key points      Outcomes   Expected Outcomes Demonstrated interest in learning. Expect positive outcomes    Future DMSE 6 months    Program Status Not Completed      Subsequent Visit   Since your last visit have you continued or begun to take your medications as prescribed? Yes    Since your last visit have you experienced any weight changes? Gain    Weight Gain (lbs) 5          Individualized Plan for Diabetes Self-Management Training:   Learning Objective:  Patient will have a greater understanding of diabetes self-management. Patient education plan is to attend individual and/or group sessions per assessed needs and concerns.   Plan:   Patient Instructions  Consider Magnesium Glycinate in the evening to see if this helps you sleep. Consider getting your vitamin D  rechecked  Whole grains Lean protein Aim for about 60 grams of carbohydrates per meal Aim to have balanced carbohydrate intake from meal to meal to avoid overeating carbohydrates at and after dinner. Are you hungry at night or just a habit? Water or herbal tea are good choices.  Consider orange slices, lemon and or lime.   Avoid eating for 3 hours before bed.  Add a protein snack or lunch in the middle of the day             Greek yogurt             Protein bar [facility] Protein bar)             Protein shake             Peanut butter with 6 crackers             Cheese and  crackers             Chicken salad on celery, [employer] bread, crackers  [facility] - on [address] near [employer]  Expected Outcomes:  Demonstrated interest in learning. Expect positive outcomes  Education material provided:   If problems or questions, patient to contact team via:  Phone  Future DSME appointment: 6 months

## 2024-04-17 NOTE — Patient Instructions (Addendum)
 Consider Magnesium Glycinate in the evening to see if this helps you sleep. Consider getting your vitamin D  rechecked  Whole grains Lean protein Aim for about 60 grams of carbohydrates per meal Aim to have balanced carbohydrate intake from meal to meal to avoid overeating carbohydrates at and after dinner. Are you hungry at night or just a habit? Water or herbal tea are good choices.  Consider orange slices, lemon and or lime.  Avoid eating for 3 hours before bed.  Add a protein snack or lunch in the middle of the day             Greek yogurt             Protein bar [facility] Protein bar)             Protein shake             Peanut butter with 6 crackers             Cheese and crackers             Chicken salad on celery, [employer] bread, crackers  [facility] - on [address] near [employer]

## 2024-04-18 ENCOUNTER — Ambulatory Visit: Admitting: "Endocrinology

## 2024-04-18 ENCOUNTER — Other Ambulatory Visit (HOSPITAL_BASED_OUTPATIENT_CLINIC_OR_DEPARTMENT_OTHER): Payer: Self-pay

## 2024-04-18 ENCOUNTER — Encounter: Payer: Self-pay | Admitting: "Endocrinology

## 2024-04-18 VITALS — BP 118/70 | HR 68 | Ht 70.0 in | Wt 182.0 lb

## 2024-04-18 DIAGNOSIS — E119 Type 2 diabetes mellitus without complications: Secondary | ICD-10-CM

## 2024-04-18 DIAGNOSIS — E7800 Pure hypercholesterolemia, unspecified: Secondary | ICD-10-CM | POA: Diagnosis not present

## 2024-04-18 DIAGNOSIS — Z7984 Long term (current) use of oral hypoglycemic drugs: Secondary | ICD-10-CM

## 2024-04-18 MED ORDER — METFORMIN HCL ER 500 MG PO TB24
1000.0000 mg | ORAL_TABLET | Freq: Two times a day (BID) | ORAL | 5 refills | Status: AC
  Filled 2024-04-18 – 2024-04-28 (×2): qty 120, 30d supply, fill #0
  Filled 2024-05-29: qty 360, 90d supply, fill #1

## 2024-04-18 MED ORDER — GLIPIZIDE 2.5 MG PO TABS
2.5000 mg | ORAL_TABLET | ORAL | 1 refills | Status: AC | PRN
  Filled 2024-04-18: qty 90, 90d supply, fill #0

## 2024-04-18 MED ORDER — FREESTYLE LIBRE 3 PLUS SENSOR MISC
3 refills | Status: AC
  Filled 2024-04-18: qty 6, 90d supply, fill #0
  Filled 2024-04-20: qty 2, 30d supply, fill #0
  Filled 2024-05-09: qty 2, 30d supply, fill #1
  Filled 2024-06-07: qty 2, 30d supply, fill #2

## 2024-04-18 NOTE — Progress Notes (Signed)
 Outpatient Endocrinology Note Reginald Birmingham, MD  04/18/2024   Reginald Rios 09/17/1955 [id number]  Referring Provider: Frann Mabel Mt, DO Primary Care Provider: Frann Mabel Mt, DO Reason for consultation: Subjective   Assessment & Plan  Diagnoses and all orders for this visit:  Controlled type 2 diabetes mellitus without complication (HCC)  Long term (current) use of oral hypoglycemic drugs  Pure hypercholesterolemia  Other orders -     glipiZIDE  2.5 MG TABS; Take 2.5 mg by mouth as needed (as needed for blood sugar mroe than 220). -     metFORMIN  (GLUCOPHAGE -XR) 500 MG 24 hr tablet; Take 2 tablets (1,000 mg total) by mouth 2 (two) times daily with a meal. -     Continuous Glucose Sensor (FREESTYLE LIBRE 3 PLUS SENSOR) MISC; Change sensor every 15 days.   Diabetes Type II complicated by hyperglycemia,  Lab Results  Component Value Date   GFR 94.89 02/08/2024   Hba1c goal less than 7, current Hba1c is  Lab Results  Component Value Date   HGBA1C 7.2 (H) 04/12/2024   Will recommend the following: Jardiance  25 mg every day  Metformin  XR 500 mg 2 pills bid (abdominal cramps on regular metformin ) Actos  45 mg every day  Glyburide  2.5 mg every day PRN (BG >220)  On gabapentin  post back surgery for numbness   No urine infection No history of heart attack/heart failure/pedal edema/urinary bladder cancer  No known contraindications/side effects to any of above medications  -Last LD and Tg are as follows: Lab Results  Component Value Date   LDLCALC 38 04/12/2024    Lab Results  Component Value Date   TRIG 66 04/12/2024   -On rosuvastatin  5 mg every day, ezetimibe  10 mg qd (has cramps with statin before) - has a cardiologist  -has a cardiologist and cardiac stent  -Follow low fat diet and exercise   -Blood pressure goal <140/90 - Microalbumin/creatinine goal is < 30 -Last MA/Cr is as follows: Lab Results  Component Value Date    MICROALBUR 0.7 04/13/2024   -on ACE/ARB losartan  25 mg qd -diet changes including salt restriction -limit eating outside -counseled BP targets per standards of diabetes care -uncontrolled blood pressure can lead to retinopathy, nephropathy and cardiovascular and atherosclerotic heart disease  Reviewed and counseled on: -A1C target -Blood sugar targets -Complications of uncontrolled diabetes  -Checking blood sugar before meals and bedtime and bring log next visit -All medications with mechanism of action and side effects -Hypoglycemia management: rule of 15's, Glucagon  Emergency Kit and medical alert ID -low-carb low-fat plate-method diet -At least 20 minutes of physical activity per day -Annual dilated retinal eye exam and foot exam -compliance and follow up needs -follow up as scheduled or earlier if problem gets worse  Call if blood sugar is less than 70 or consistently above 250    Take a 15 gm snack of carbohydrate at bedtime before you go to sleep if your blood sugar is less than 100.    If you are going to fast after midnight for a test or procedure, ask your physician for instructions on how to reduce/decrease your insulin  dose.    Call if blood sugar is less than 70 or consistently above 250  -Treating a low sugar by rule of 15  (15 gms of sugar every 15 min until sugar is more than 70) If you feel your sugar is low, test your sugar to be sure If your sugar is low (less than 70), then  take 15 grams of a fast acting Carbohydrate (3-4 glucose tablets or glucose gel or 4 ounces of juice or regular soda) Recheck your sugar 15 min after treating low to make sure it is more than 70 If sugar is still less than 70, treat again with 15 grams of carbohydrate          Don't drive the hour of hypoglycemia  If unconscious/unable to eat or drink by mouth, use glucagon  injection or nasal spray baqsimi  and call 911. Can repeat again in 15 min if still unconscious.  Return in about 6  months (around 10/17/2024).   I have reviewed current medications, nurse's notes, allergies, vital signs, past medical and surgical history, family medical history, and social history for this encounter. Counseled patient on symptoms, examination findings, lab findings, imaging results, treatment decisions and monitoring and prognosis. The patient understood the recommendations and agrees with the treatment plan. All questions regarding treatment plan were fully answered.  Reginald Birmingham, MD  04/18/2024    History of Present Illness Reginald Rios is a 68 y.o. year old male who presents for follow up of Type II diabetes mellitus.  Reginald Rios was first diagnosed around 2015.  Diabetes education +  Will recommend the following: Jardiance  25 mg every day  Metformin  XR 500 mg 2 pills bid (abdominal cramps on regular metformin ) Actos  45 mg every day  Ran out of Glyburide  2.5 mg every day PRN (BG >220)  COMPLICATIONS -  MI/Stroke -  retinopathy + neuropathy (?related to nerve issues) -  nephropathy  BLOOD SUGAR DATA 117-228 checks 1-3 times a day   Physical Exam  BP 118/70   Pulse 68   Ht 5' 10 (1.778 m)   Wt 182 lb (82.6 kg)   SpO2 98%   BMI 26.11 kg/m    Constitutional: well developed, well nourished Head: normocephalic, atraumatic Eyes: sclera anicteric, no redness Neck: supple Lungs: normal respiratory effort Neurology: alert and oriented Skin: dry, no appreciable rashes Musculoskeletal: no appreciable defects Psychiatric: normal mood and affect Diabetic Foot Exam - Simple   No data filed      Current Medications Patient's Medications  New Prescriptions   CONTINUOUS GLUCOSE SENSOR (FREESTYLE LIBRE 3 PLUS SENSOR) MISC    Change sensor every 15 days.   GLIPIZIDE  2.5 MG TABS    Take 2.5 mg by mouth as needed (as needed for blood sugar mroe than 220).  Previous Medications   ACETAMINOPHEN  (TYLENOL ) 500 MG TABLET    Take 1,000 mg by mouth every 6 (six)  hours as needed for moderate pain (pain score 4-6).   ALBUTEROL  (VENTOLIN  HFA) 108 (90 BASE) MCG/ACT INHALER    Inhale 2 puffs into the lungs every 4 (four) to 6 (six) hours as needed for coughing or wheezing spells.   AMLODIPINE  (NORVASC ) 2.5 MG TABLET    Take 1 tablet (2.5 mg total) by mouth daily.   AZELASTINE  (ASTELIN ) 0.1 % NASAL SPRAY    Place 2 sprays into both nostrils 2 (two) times daily as needed for rhinitis (nasal congestion).   BACLOFEN  (LIORESAL ) 10 MG TABLET    Take 1 tablet (10 mg total) by mouth every 6 (six) hours as needed for spasm.   BLOOD GLUCOSE MONITORING SUPPL (FREESTYLE FREEDOM LITE) W/DEVICE KIT    Use to check blood sugar daily.   CLOBETASOL  PROPIONATE 0.05 % SHAMPOO    Apply 1(one) application(s) topical every 2(two) days, leave on 5 minutes before rinsing.   CLOPIDOGREL  (PLAVIX )  75 MG TABLET    Take 1 tablet (75 mg total) by mouth daily.   DICLOFENAC  SODIUM (VOLTAREN ) 1 % GEL    Apply 2 g topically 4 (four) times daily.   EMPAGLIFLOZIN  (JARDIANCE ) 25 MG TABS TABLET    Take 1 tablet (25 mg total) by mouth daily before breakfast.   EZETIMIBE  (ZETIA ) 10 MG TABLET    Take 1 tablet (10 mg total) by mouth daily.   FLUOCINONIDE  CREAM (LIDEX ) 0.05 %    Apply 1(one) application(s) topical 2(two) times a day to neck, back and ears as needed for itching   FLUTICASONE  (FLONASE ) 50 MCG/ACT NASAL SPRAY    Place 2 sprays into both nostrils daily.   GABAPENTIN  (NEURONTIN ) 100 MG CAPSULE    Take 1 capsule (100 mg total) by mouth 3 (three) times daily as needed.   GLUCAGON  (GVOKE HYPOPEN  1-PACK) 1 MG/0.2ML SOAJ    Inject into the skin in the event of low sugar.   GLUCOSE BLOOD (FREESTYLE LITE) TEST STRIP    Use daily to check blood sugar.   GLUCOSE BLOOD (FREESTYLE LITE) TEST STRIP    Use daily to check blood sugar.   GLYBURIDE  (DIABETA ) 2.5 MG TABLET    Take 1 tablet (2.5 mg total) by mouth daily with breakfast.   KETOCONAZOLE  (NIZORAL ) 2 % SHAMPOO    Apply 1 Application topically 2  (two) times a week. Leave on for 5 min before rinsing.   LEVOCETIRIZINE (XYZAL ) 5 MG TABLET    Take 1 tablet (5 mg total) by mouth every evening.   LIDOCAINE  (LIDODERM ) 5 %    Apply 1 patch by transdermal route once a day (May wear up to 12hours).   LOSARTAN  (COZAAR ) 25 MG TABLET    Take 1 tablet (25 mg total) by mouth daily.   MECLIZINE  (ANTIVERT ) 25 MG TABLET    Take 1 tablet (25 mg total) by mouth 3 (three) times daily as needed for dizziness.   METHOCARBAMOL  (ROBAXIN ) 500 MG TABLET    Take 1 tablet (500 mg total) by mouth every 6 (six) hours as needed for muscle spasms.   METHYLPHENIDATE  (RITALIN ) 20 MG TABLET    Take 1 tablet (20 mg total) by mouth 2 (two) times daily.   METHYLPHENIDATE  (RITALIN ) 20 MG TABLET    Take 1 tablet (20 mg total) by mouth 2 (two) times daily.   METHYLPHENIDATE  (RITALIN ) 20 MG TABLET    Take 1 tablet (20 mg total) by mouth 2 (two) times daily.   MONTELUKAST  (SINGULAIR ) 10 MG TABLET    Take 1 tablet (10 mg total) by mouth daily.   MULTIPLE VITAMIN (MULTIVITAMIN WITH MINERALS) TABS TABLET    Take 1 tablet by mouth in the morning. One A Day   NITROGLYCERIN  (NITROSTAT ) 0.4 MG SL TABLET    Place 0.4 mg under the tongue every 5 (five) minutes x 3 doses as needed for chest pain.   PANTOPRAZOLE  (PROTONIX ) 40 MG TABLET    Take 1 tablet (40 mg total) by mouth daily.   PIOGLITAZONE  (ACTOS ) 45 MG TABLET    Take 1 tablet (45 mg total) by mouth daily.   PROBIOTIC PRODUCT (PROBIOTIC PO)    Take 2 capsules by mouth in the morning.   ROSUVASTATIN  (CRESTOR ) 5 MG TABLET    Take 1 tablet (5 mg total) by mouth daily.   SILDENAFIL  (VIAGRA ) 100 MG TABLET    Take 1 tablet (100 mg total) by mouth daily as needed. Take 1 hour prior to  sexual activity   TESTOSTERONE  (ANDROGEL ) 50 MG/5GM (1%) GEL    Apply 2 applications onto the skin daily.   VITAMIN D , ERGOCALCIFEROL , (DRISDOL ) 1.25 MG (50000 UNIT) CAPS CAPSULE    Take 1 capsule (50,000 Units total) by mouth every 7 (seven) days.  Modified  Medications   Modified Medication Previous Medication   METFORMIN  (GLUCOPHAGE -XR) 500 MG 24 HR TABLET metFORMIN  (GLUCOPHAGE -XR) 500 MG 24 hr tablet      Take 2 tablets (1,000 mg total) by mouth 2 (two) times daily with a meal.    Take 2 tablets (1,000 mg total) by mouth 2 (two) times daily with a meal.  Discontinued Medications   No medications on file    Allergies Allergies  Allergen Reactions   Invokana  [Canagliflozin ] Rash   Grass Pollen(K-O-R-T-Swt Vern)    Lisinopril  Cough   Semaglutide  Nausea Only    Causes cramping. 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 No issues now   Hemarthrosis of right elbow 02/03/2016   Hoarseness 04/13/2019   Hyperlipidemia    family hx of high cholesterol   Hypertension    Hypogonadism in male    Incomplete rotator cuff tear 09/10/2015   Injected 09/10/2015   Lateral epicondylitis of right elbow 07/28/2019   Low back  pain at multiple sites 02/05/2021   Low vitamin D  level 04/22/2017   Mild persistent asthma/cough variant asthma 04/13/2019   Myofascial pain syndrome, cervical 07/28/2019   Neck pain 06/01/2018   Nonischemic cardiomyopathy (HCC) 05/15/2015   Nontraumatic incomplete tear of right rotator cuff 07/25/2019   Occipital headache 06/01/2018   Onychomycosis 05/25/2018   Osteoarthritis    Pain in right ankle and joints of right foot 10/05/2016   Palpitations 07/06/2017   Pernicious anemia    PONV (postoperative nausea and vomiting)    Preop cardiovascular exam 10/18/2023   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   Shoulder bursitis 10/12/2014   Injected in 10/12/2014  Repeat 01/21/2015   Small thenar eminence 02/03/2016   Spinal stenosis, lumbar region, without neurogenic claudication 10/12/2014   Spondylolisthesis at L5-S1 level 05/09/2021   Strain of latissimus dorsi muscle 01/21/2015   Tendinopathy of right biceps tendon 07/25/2019   Type 2 diabetes mellitus with hyperglycemia, without long-term current use of insulin  (HCC) 12/22/2013   Ulnar neuropathy at elbow of right upper extremity 01/03/2019    Past Surgical History Past Surgical History:  Procedure Laterality Date   CATARACT EXTRACTION W/ INTRAOCULAR LENS IMPLANT Bilateral 2023  COLONOSCOPY     CORONARY PRESSURE/FFR STUDY N/A 06/23/2017   Procedure: INTRAVASCULAR PRESSURE WIRE/FFR STUDY;  Surgeon: Anner Alm ORN, MD;  Location: [facility] INVASIVE CV LAB;  Service: Cardiovascular;  Laterality: N/A;   CORONARY STENT INTERVENTION N/A 06/23/2017   Procedure: CORONARY STENT INTERVENTION;  Surgeon: Anner Alm ORN, MD;  Location: [facility] INVASIVE CV LAB;  Service: Cardiovascular;  Laterality: N/A;   KNEE ARTHROSCOPY Left    LEFT HEART CATH AND CORONARY ANGIOGRAPHY N/A 06/23/2017   Procedure: LEFT HEART CATH AND CORONARY ANGIOGRAPHY;  Surgeon: Anner Alm ORN, MD;  Location: [facility] INVASIVE CV LAB;  Service: Cardiovascular;  Laterality: N/A;   LEFT HEART  CATH AND CORONARY ANGIOGRAPHY N/A 07/14/2023   Procedure: LEFT HEART CATH AND CORONARY ANGIOGRAPHY;  Surgeon: Elmira Newman PARAS, MD;  Location: [facility] INVASIVE CV LAB;  Service: Cardiovascular;  Laterality: N/A;   SHOULDER ARTHROSCOPY WITH BICEPSTENOTOMY Right 09/06/2019   Procedure: SHOULDER ARTHROSCOPY WITH BICEPSTENOTOMY;  Surgeon: Jerri Kay HERO, MD;  Location: [facility];  Service: Orthopedics;  Laterality: Right;   SHOULDER ARTHROSCOPY WITH SUBACROMIAL DECOMPRESSION Right 09/06/2019   Procedure: RIGHT SHOULDER ARTHROSCOPY WITH EXTENSIVE DEBRIDEMENT, SUBACROMIAL DECOMPRESSION, BICEPS TENOTOMY;  Surgeon: Jerri Kay HERO, MD;  Location: [facility];  Service: Orthopedics;  Laterality: Right;   TENOTOMY ACHILLES TENDON  2007   In [place], [place]   ULNAR NERVE TRANSPOSITION  2006   In [place], [place]    Family History family history includes Asthma in his paternal aunt; Cancer in his mother; Coronary artery disease (age of onset: 24) in his father; Food Allergy in his son; Heart disease in his father; Hypothyroidism in his brother; Lung cancer (age of onset: 20) in his mother.  Social History Social History   Socioeconomic History   Marital status: Married    Spouse name: Not on file   Number of children: Not on file   Years of education: Not on file   Highest education level: Some college, no degree  Occupational History   Occupation: conservation officer, historic buildings   Tobacco Use   Smoking status: Never    Passive exposure: Never   Smokeless tobacco: Never  Vaping Use   Vaping status: Never Used  Substance and Sexual Activity   Alcohol use: Not Currently    Comment: 1-2 drinks/monthly   Drug use: No   Sexual activity: Yes  Other Topics Concern   Not on file  Social History Narrative   Previously worked as environmental manager   Currently working at [employer]   Married 30 years   2 children ages 86, 70   Wife is psychiatrist - Dr. Adrien Gull   Moved from [place]    Grew up in [place]   Social Drivers of Health   Tobacco Use: Low Risk (04/18/2024)   Patient History    Smoking Tobacco Use: Never    Smokeless Tobacco Use: Never    Passive Exposure: Never  Financial Resource Strain: Low Risk (03/14/2024)   Overall Financial Resource Strain (CARDIA)    Difficulty of Paying Living Expenses: Not hard at all  Food Insecurity: No Food Insecurity (03/14/2024)   Epic    Worried About Programme Researcher, Broadcasting/film/video in the Last Year: Never true    Ran Out of Food in the Last Year: Never true  Transportation Needs: No Transportation Needs (03/14/2024)   Epic    Lack of Transportation (Medical): No    Lack of Transportation (Non-Medical): No  Physical Activity: Insufficiently Active (03/14/2024)   Exercise  Vital Sign    Days of Exercise per Week: 7 days    Minutes of Exercise per Session: 20 min  Stress: No Stress Concern Present (03/14/2024)   [employer] of Occupational Health - Occupational Stress Questionnaire    Feeling of Stress: Not at all  Social Connections: Moderately Integrated (03/14/2024)   Social Connection and Isolation Panel    Frequency of Communication with Friends and Family: More than three times a week    Frequency of Social Gatherings with Friends and Family: Three times a week    Attends Religious Services: Never    Active Member of Clubs or Organizations: Yes    Attends Banker Meetings: 1 to 4 times per year    Marital Status: Married  Recent Concern: Social Connections - Moderately Isolated (12/17/2023)   Social Connection and Isolation Panel    Frequency of Communication with Friends and Family: More than three times a week    Frequency of Social Gatherings with Friends and Family: Three times a week    Attends Religious Services: Never    Active Member of Clubs or Organizations: No    Attends Banker Meetings: Not on file    Marital Status: Married  Intimate Partner Violence: Not on file   Depression (PHQ2-9): Low Risk (02/08/2024)   Depression (PHQ2-9)    PHQ-2 Score: 4  Alcohol Screen: Low Risk (03/14/2024)   Alcohol Screen    Last Alcohol Screening Score (AUDIT): 1  Housing: Unknown (03/14/2024)   Epic    Unable to Pay for Housing in the Last Year: No    Number of Times Moved in the Last Year: Not on file    Homeless in the Last Year: No  Utilities: Not on file  Health Literacy: Not on file    Lab Results  Component Value Date   HGBA1C 7.2 (H) 04/12/2024   HGBA1C 7.1 (A) 01/11/2024   HGBA1C 7.2 (A) 10/11/2023   Lab Results  Component Value Date   CHOL 116 04/12/2024   Lab Results  Component Value Date   HDL 63 04/12/2024   Lab Results  Component Value Date   LDLCALC 38 04/12/2024   Lab Results  Component Value Date   TRIG 66 04/12/2024   Lab Results  Component Value Date   CHOLHDL 1.8 04/12/2024   Lab Results  Component Value Date   CREATININE 0.72 04/12/2024   Lab Results  Component Value Date   GFR 94.89 02/08/2024   Lab Results  Component Value Date   MICROALBUR 0.7 04/13/2024      Component Value Date/Time   NA 141 04/12/2024 0824   NA 139 04/22/2022 1439   K 4.3 04/12/2024 0824   CL 105 04/12/2024 0824   CO2 26 04/12/2024 0824   GLUCOSE 136 (H) 04/12/2024 0824   BUN 16 04/12/2024 0824   BUN 12 04/22/2022 1439   CREATININE 0.72 04/12/2024 0824   CALCIUM  9.3 04/12/2024 0824   PROT 7.2 04/12/2024 0824   PROT 6.8 04/22/2022 1439   ALBUMIN  4.7 02/08/2024 0959   ALBUMIN  4.6 04/22/2022 1439   AST 18 04/12/2024 0824   AST 19 03/06/2022 1331   ALT 15 04/12/2024 0824   ALT 20 03/06/2022 1331   ALKPHOS 84 02/08/2024 0959   BILITOT 0.3 04/12/2024 0824   BILITOT 0.3 04/22/2022 1439   BILITOT 0.4 03/06/2022 1331   GFRNONAA >60 10/26/2023 0748   GFRNONAA >60 03/06/2022 1331   GFRAA >60 08/30/2019 1300  Latest Ref Rng & Units 04/12/2024    8:24 AM 02/08/2024    9:59 AM 10/26/2023    7:48 AM  BMP  Glucose 65 - 99 mg/dL  863  884  747   BUN 7 - 25 mg/dL 16  11  14    Creatinine 0.70 - 1.28 mg/dL 9.27  9.33  9.01   BUN/Creat Ratio 6 - 22 (calc) SEE NOTE:     Sodium 135 - 146 mmol/L 141  142  138   Potassium 3.5 - 5.3 mmol/L 4.3  4.2  3.9   Chloride 98 - 110 mmol/L 105  104  106   CO2 20 - 32 mmol/L 26  27  21    Calcium  8.6 - 10.3 mg/dL 9.3  9.7  8.2        Component Value Date/Time   WBC 5.0 03/27/2024 0942   RBC 3.52 (L) 03/27/2024 0942   HGB 9.2 (L) 03/27/2024 0942   HGB 12.5 (L) 03/06/2022 1331   HGB 13.1 06/11/2017 1302   HCT 28.5 (L) 03/27/2024 0942   HCT 38.8 06/11/2017 1302   PLT 293.0 03/27/2024 0942   PLT 227 03/06/2022 1331   PLT 235 06/11/2017 1302   MCV 81.1 03/27/2024 0942   MCV 96 06/11/2017 1302   MCH 31.6 10/26/2023 0748   MCHC 32.1 03/27/2024 0942   RDW 19.8 (H) 03/27/2024 0942   RDW 14.7 06/11/2017 1302   LYMPHSABS 1.4 03/27/2024 0942   LYMPHSABS 1.9 04/21/2017 1019   MONOABS 0.5 03/27/2024 0942   EOSABS 0.1 03/27/2024 0942   EOSABS 0.1 04/21/2017 1019   BASOSABS 0.0 03/27/2024 0942   BASOSABS 0.0 04/21/2017 1019     Parts of this note may have been dictated using voice recognition software. There may be variances in spelling and vocabulary which are unintentional. Not all errors are proofread. Please notify the dino if any discrepancies are noted or if the meaning of any statement is not clear.

## 2024-04-19 ENCOUNTER — Ambulatory Visit (HOSPITAL_BASED_OUTPATIENT_CLINIC_OR_DEPARTMENT_OTHER): Admitting: Physical Therapy

## 2024-04-19 ENCOUNTER — Encounter (HOSPITAL_BASED_OUTPATIENT_CLINIC_OR_DEPARTMENT_OTHER): Payer: Self-pay | Admitting: Physical Therapy

## 2024-04-19 DIAGNOSIS — G8929 Other chronic pain: Secondary | ICD-10-CM

## 2024-04-19 DIAGNOSIS — M6281 Muscle weakness (generalized): Secondary | ICD-10-CM

## 2024-04-19 DIAGNOSIS — R29898 Other symptoms and signs involving the musculoskeletal system: Secondary | ICD-10-CM

## 2024-04-19 NOTE — Therapy (Signed)
 OUTPATIENT PHYSICAL THERAPY THORACOLUMBAR TREATMENT  Patient Name: Reginald Rios MRN: [id number] DOB:January 15, 1956, 68 y.o., male Today's Date: 04/19/2024  END OF SESSION:        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   BPPV (benign paroxysmal positional vertigo) 08/14/2014   CAD (coronary artery disease)    2/19 PCI/DESx1 to Lcx   Cervical disc disorder with radiculopathy of cervical region 10/07/2015   Cervical radicular pain 01/10/2019   Chronic cough 04/13/2019   Decreased cardiac ejection fraction 04/02/2015   Diabetes mellitus without complication (HCC)    Dyspnea on exertion 05/15/2015   Erectile dysfunction 07/11/2019   Exercise-induced asthma    Family history of early CAD 07/05/2017   Fatigue 05/15/2015   Hallux rigidus, right foot 09/15/2021   Heart murmur    In early childhood. No issues now   Hemarthrosis of right elbow 02/03/2016   Hoarseness 04/13/2019   Hyperlipidemia    family hx of high cholesterol   Hypertension    Hypogonadism in male    Incomplete rotator cuff tear 09/10/2015   Injected 09/10/2015   Lateral epicondylitis of right elbow 07/28/2019   Low back pain at multiple sites 02/05/2021   Low vitamin D  level 04/22/2017   Mild persistent asthma/cough variant asthma 04/13/2019   Myofascial pain syndrome, cervical 07/28/2019   Neck pain 06/01/2018   Nonischemic cardiomyopathy (HCC) 05/15/2015   Nontraumatic incomplete tear of right rotator cuff 07/25/2019   Occipital headache 06/01/2018   Onychomycosis 05/25/2018   Osteoarthritis    Pain in right  ankle and joints of right foot 10/05/2016   Palpitations 07/06/2017   Pernicious anemia    PONV (postoperative nausea and vomiting)    Preop cardiovascular exam 10/18/2023   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   Shoulder bursitis 10/12/2014   Injected in 10/12/2014  Repeat 01/21/2015   Small thenar eminence 02/03/2016   Spinal stenosis, lumbar region, without neurogenic claudication 10/12/2014   Spondylolisthesis at L5-S1 level 05/09/2021   Strain of latissimus dorsi muscle 01/21/2015   Tendinopathy of right biceps tendon 07/25/2019   Type 2 diabetes mellitus with hyperglycemia, without long-term current use of insulin  (HCC) 12/22/2013   Ulnar neuropathy at elbow of right upper extremity 01/03/2019   Past Surgical History:  Procedure Laterality Date   CATARACT EXTRACTION W/ INTRAOCULAR LENS IMPLANT Bilateral 2023   COLONOSCOPY     CORONARY PRESSURE/FFR STUDY N/A 06/23/2017   Procedure: INTRAVASCULAR PRESSURE WIRE/FFR STUDY;  Surgeon: Anner Alm ORN, MD;  Location: [facility] IN[facility]IVE [facility];  Service: Cardiovascular;  Laterality: N/A;   CORONARY STENT INTERVENTION N/A 06/23/2017   Procedure: CORONARY STENT INTERVENTION;  Surgeon: Anner Alm ORN, MD;  Location: [facility] INVASIVE CV LAB;  Service: Cardiovascular;  Laterality: N/A;   KNEE ARTHROSCOPY Left    LEFT HEART CATH AND CORONARY ANGIOGRAPHY N/A 06/23/2017   Procedure: LEFT HEART CATH AND CORONARY ANGIOGRAPHY;  Surgeon: Anner Alm ORN, MD;  Location: [facility] INVASIVE CV LAB;  Service: Cardiovascular;  Laterality: N/A;   LEFT HEART CATH AND CORONARY ANGIOGRAPHY N/A 07/14/2023   Procedure: LEFT HEART CATH AND CORONARY ANGIOGRAPHY;  Surgeon: Elmira Newman PARAS, MD;  Location: [facility] INVASIVE CV LAB;  Service: Cardiovascular;  Laterality: N/A;   SHOULDER ARTHROSCOPY WITH BICEPSTENOTOMY Right 09/06/2019   Procedure: SHOULDER ARTHROSCOPY WITH BICEPSTENOTOMY;  Surgeon: Jerri Kay HERO, MD;  Location: [facility];  Service: Orthopedics;  Laterality:  Right;   SHOULDER ARTHROSCOPY WITH SUBACROMIAL DECOMPRESSION Right 09/06/2019   Procedure: RIGHT SHOULDER ARTHROSCOPY WITH EXTENSIVE DEBRIDEMENT, SUBACROMIAL DECOMPRESSION, BICEPS TENOTOMY;  Surgeon: Jerri Kay HERO, MD;  Location: [facility];  Service: Orthopedics;  Laterality: Right;   TENOTOMY ACHILLES TENDON  2007   In [place], [place]   ULNAR NERVE TRANSPOSITION  2006   In [place], [place]   Patient Active Problem List   Diagnosis Date Noted   Injection site reaction 03/14/2024   Hypertension    PONV (postoperative nausea and vomiting)    Preop cardiovascular exam 10/18/2023   Pernicious anemia    Hallux rigidus, right foot 09/15/2021   Spondylolisthesis at L5-S1 level 05/09/2021   Low back pain at multiple sites 02/05/2021   Osteoarthritis    Heart murmur    Exercise-induced asthma    Diabetes mellitus without complication (HCC)    Arthritis    Anemia    Myofascial pain syndrome, cervical 07/28/2019   Lateral epicondylitis of right elbow 07/28/2019   Nontraumatic incomplete tear of right rotator cuff 07/25/2019   Tendinopathy of right biceps tendon 07/25/2019   Erectile dysfunction 07/11/2019   Mild persistent asthma/cough variant asthma 04/13/2019   Allergic rhinitis with a possible nonallergic component 04/13/2019   Acid reflux 04/13/2019   Chronic cough 04/13/2019   Hoarseness 04/13/2019   Cervical radicular pain 01/10/2019   Ulnar neuropathy at elbow of right upper extremity 01/03/2019   Acute bilateral low back pain without sciatica 01/03/2019   Neck pain 06/01/2018   Occipital headache 06/01/2018   Onychomycosis 05/25/2018   Palpitations 07/06/2017   CAD (coronary artery disease) 07/05/2017   Family history of early CAD 07/05/2017   Abnormal findings on diagnostic imaging of cardiovascular system 06/23/2017   Low vitamin D  level 04/22/2017   Pain in right ankle and joints of right foot 10/05/2016   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   Hemarthrosis of  right elbow 02/03/2016   Small thenar eminence 02/03/2016   Cervical disc disorder with radiculopathy of cervical region 10/07/2015   Incomplete rotator cuff tear 09/10/2015   Nonischemic cardiomyopathy (HCC) 05/15/2015   Fatigue 05/15/2015   Dyspnea on exertion 05/15/2015   Decreased cardiac ejection fraction 04/02/2015   Strain of latissimus dorsi muscle 01/21/2015   Shoulder bursitis 10/12/2014   Spinal stenosis, lumbar region, without neurogenic claudication 10/12/2014   BPPV (benign paroxysmal positional vertigo) 08/14/2014   Hyperlipidemia 01/19/2014   Anemia, unspecified 01/19/2014   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   Type 2 diabetes mellitus with hyperglycemia, without long-term current use of insulin  (HCC) 12/22/2013   Hypogonadism in male 12/22/2013   Arthritis of hand, left 12/22/2013    PCP: Frann Mabel Mt, DO  REFERRING PROVIDER: Colon Shove, MD  REFERRING DIAG: Spondylolisthesis, lumbosacral region [M43.17]   Rationale for Evaluation and Treatment: Rehabilitation  THERAPY DIAG:  Muscle weakness (generalized)  Chronic bilateral low back pain with right-sided sciatica  Other symptoms and signs involving the musculoskeletal system  ONSET DATE: 10/25/23 surgery date.  SUBJECTIVE:  SUBJECTIVE STATEMENT: Pt reports sharp pain in back at 4am this morning when rolling over.  Back feels stiff and sore this morning.      Eval: Pt was playing pickleball at [place] in January 2025l. He reports having pain following game and was unable to walk. Started having legs give way. Pain has been consistent prior/ after surgery, but no longer shooting pain. He also noted numbness in bilat LE's into feet. He states that numbness is new. My doctor says  I  cannot do anymore damage at this point, He also said not to twist, I don't twist anymore as it flares everything up. I have tried walking on the beach and on uneven surfaces and it continues to cause a lot of pain and difficulty.   PERTINENT HISTORY:  Cardiac, Acid reflux, Anemia, Arthritis, ADD, BPPV, CAD, DM, Dyspnea on exertion, Osteoarthritis.     PAIN:  Are you having pain? Yes: NPRS scale: 7/10 Pain location: center of back dull ache and into both hips Pain description: Achy pain, sharp pain Aggravating factors: twisting, walking on uneven surfaces.  Relieving factors: Resting.   PRECAUTIONS: None  RED FLAGS: None   WEIGHT BEARING RESTRICTIONS: No  FALLS:  Has patient fallen in last 6 months? No  LIVING ENVIRONMENT: Lives with: lives with their family Lives in: House/apartment Stairs: 24 stairs internally, 3 externally.  Has following equipment at home: None  OCCUPATION: Retired   PLOF: Independent  PATIENT GOALS: Pt would like to get back to walking.   NEXT MD VISIT: None currently.   OBJECTIVE:  Note: Objective measures were completed at Evaluation unless otherwise noted.  DIAGNOSTIC FINDINGS:  1. Status post L4-S1 discectomy and fusion without evidence of complication. The central canal and foramina appear widely patent at the surgical levels. 2. Mild to moderate central canal narrowing at L2-3 and L3-4. 3. Aortic atherosclerosis.   Aortic Atherosclerosis (ICD10-I70.0).  PATIENT SURVEYS:  Oswestry: 21/50 04/03/24: 18/50  COGNITION: Overall cognitive status: Within functional limits for tasks assessed     SENSATION: WFL   POSTURE: rounded shoulders and forward head  PALPATION: Tenderness across lower back.   LUMBAR ROM:   AROM eval 04/03/24  Flexion 15%* hip flexion mostly.    Extension 10%  No change no pain  Right lateral flexion Pt denies movement due to pain 90% limited pain  Left lateral flexion Pt denies movement due to pain 75%  limited no pain  Right rotation Pt denies movement due to pain Full no pain  Left rotation Pt denies movement due to pain Full no pain   (Blank rows = not tested)  LOWER EXTREMITY ROM:     Active  Right eval Left eval R / L 04/03/24  Hip flexion WFL with reports of pain in lower back  [facility] with reports of pain in lower back  Full no pain  Hip extension     Hip abduction     Hip adduction     Hip internal rotation     Hip external rotation [facility]   Knee flexion [facility]  [facility]    Knee extension WFL with reports of pain in lower back [facility] with reports of pain in lower back No pain   (Blank rows = not tested)  LOWER EXTREMITY MMT:    MMT Right eval Left eval  Hip flexion    Hip extension    Hip abduction    Hip adduction    Hip internal rotation    Hip external  rotation 4 4  Knee flexion    Knee extension     (Blank rows = not tested)   FUNCTIONAL TESTS:  5 times sit to stand: 26.22 seconds.  04/03/24: 5x STS -18.58  04/19/24:  R SLS 5 sec, L SLS 3 sec  GAIT: Distance walked: 52ft   Assistive device utilized: None Level of assistance: Complete Independence Comments: Pt walks with a slow antalgic gait. Minimal rotation.  04/03/24: improved cadence, offloading left/antalgic  TREATMENT DATE:  [facility] Adult PT Treatment:                                             Date: 04/19/24 Pt seen for aquatic therapy today.  Treatment took place in water 3.5-4.75 ft in depth at the [facility] pool. Temp of water was 91.  Pt entered/exited the pool via stairs independently with bil rail.  -test SLS on land prior to entry - unsupported walking forward/ backward with arm swing - suitcase carry with bil/single rainbow under water walking forward backward - side step into wide squat with arm add/abdct with rainbow hand floats - UE on rainbow hand floats: leg swings into hip flex/extension x10; hip abdct/ add  x 10  - squat pushing rainbow hand float under water x10-> yellow hand  float x 10 - marching forward/backward with reciprocal row motion with rainbow hand floats - wood chop motion with UE (no floats) x 10 each direction - mod cues for form - straddling noodle extra noodle under arms and no UE support: cycling  - SLS in 9ft for 25 sec (skulling to steady) x 2 each side - marching forward/backward with reciprocal row motion  - STS from 3rd step x 6 cues for neutral head and forward arm reach, control descent - R/L hamstring stretch with heel on 2nd step and back straight 20 sec each -> swivel on standing foot for adductor stretch;  figure 4 stretch at steps, 15 sec x 2 each LE; L stretch with wag the tail   [facility] Adult PT Treatment:                                             Date: 04/14/24 Pt seen for aquatic therapy today.  Treatment took place in water 3.5-4.75 ft in depth at the [facility] pool. Temp of water was 91.  Pt entered/exited the pool via stairs independently with bil rail.  - unsupported walking forward/ backward  - side stepping with arm add/abdct with rainbow hand floats->- side step into wide squat  - UE on rainbow hand floats: leg swings into hip flex/extension 2x10; hip abdct/ add  x 10 (good balance challenge) - marching forward/backward with reciprocal row motion with rainbow hand floats - STS from 3rd step x 10 cues for neutral head and forward arm reach, control descent - suitcase carry with bil/single rainbow under water - marching forward/backward with reciprocal row motion with rainbow hand floats - wood chop motion with UE (no floats) x 6 each direction - straddling noodle extra noodle under arms and no UE support: cycling - R/L hamstring stretch with heel on 2nd step and back straight 20 sec each -> swivel on standing foot for adductor stretch;  figure 4 stretch at steps, 15  sec x 2 each LE  [facility] Adult PT Treatment:                                             Date: 04/11/24 Pt seen for aquatic therapy today.  Treatment took  place in water 3.5-4.75 ft in depth at the [facility] pool. Temp of water was 91.  Pt entered/exited the pool via stairs independently with bil rail.  - unsupported walking forward/ backward and side stepping - side stepping with arm add/abdct with yellow hand floats->- side step into wide squat with arm add/abdct with yellow hand floats - UE on yellow hand floats: leg swings into hip flex/extension x10; hip abdct/ add crossing midline x 10 (good balance challenge) - return to walking forward/ backward -> marching forward/backward-> with suitcase carry and bil yellow under water - staggered stance with TrA set and long hollow noodle pull down to thighs x 10 each LE forward, (good balance challenge) - forward walking kicks - wide stance with gentle trunk rotation arms out to side -plank at bench in water with alternating hip extension x 5 each; fire hydrant hips x 5 each - L stretch with gentle tail wag  - figure 4 stretch at steps, 30sec x 2 each LE - STS from 3rd step x 2x5 cues for neutral head and forward arm reach, control descent - straddling noodle with UE support on corner: cycling; hip abdct/ add; hip flexion/ext -> extra noodle under arms and no UE support- repeated all 3 exercises  - R/L hamstring stretch with heel on 2nd step and back straight 20 sec each -> swivel on standing foot for adductor stretch   [facility] Adult PT Treatment:                                             Date: 04/03/24 Pt seen for aquatic therapy today.  Treatment took place in water 3.5-4.75 ft in depth at the [facility] pool. Temp of water was 91.  Pt entered/exited the pool via stairs independently with bil rail.  - unsupported walking forward/ backward and side stepping - side stepping with arm add/abdct with rainbow hand floats->- side step into wide squat with arm add/abdct with yellow hand floats - L stretch with gentle tail wag  - STS from 3rd step x 2x5 cues for neutral head and  forward arm reach, control descent - figure 4 stretch at steps - UE On wall: leg swings into hip flex/extension x10; hip abdct/ add x 10  - straddling noodle with UE support on corner: cycling  - R/L hamstring stretch with heel on 2nd step and back straight 20 sec each -> swivel on standing foot for adductor stretch  [facility] Adult PT Treatment:                                             Date: 03/28/24 Pt seen for aquatic therapy today.  Treatment took place in water 3.5-4.75 ft in depth at the [facility] pool. Temp of water was 91.  Pt entered/exited the pool via stairs independently with bil rail.  - unsupported walking forward/  backward and side stepping - side stepping with arm add/abdct with rainbow hand floats  - L stretch with gentle tail wag  - UE on wall:split squats x 5 each LE forward ->  forward walking split squats with yellow hand floats - side step into wide squat with arm add/abdct with yellow hand floats - STS from 3rd step x 8, cues for neutral head and forward arm reach, control descent - UE On wall: leg swings into hip flex/extension x10; hip abdct/ add x 10  - straddling noodle with UE support on corner: cycling - R/L hamstring stretch with heel on 2nd step and back straight 20 sec each -> swivel on standing foot for adductor stretch    PATIENT EDUCATION:  Education details: Educated pt on anatomy and physiology of current symptoms, intro to aquatic therapy Person educated: Patient Education method: Programmer, Multimedia, Demonstration Education comprehension: verbalized understanding and returned demonstration  HOME EXERCISE PROGRAM: Access Code: [email or web address] URL: [email or web address] Date: 02/21/2024 Prepared by: Rojean Batten  Exercises - Supine Lower Trunk Rotation  - 1-2 x daily - 7 x weekly - 2-3 sets - 10 reps - Supine Hip Adduction Isometric with Ball  - 1-2 x daily - 7 x weekly - 2-3 sets - 10 reps   ASSESSMENT:  CLINICAL IMPRESSION:  Good  toleration of exercises completed today with gradual reduction of pain to 2-3/10.  Improved form and and tolerance for wood chop motion in 4+ ft.  SLS is challenging on land (see above); able to complete 25-30sec in 4 ft with UE skulling. Will plan to create aquatic HEP next visit. Pt voiced interest in returning to land based exercise; anticipate transition with land/water at end of cert.  Pt is making good progress towards remaining goals.    Progress note: pt reports his pain with daily activities is down ~ 60% although he does continue to have pain as high as 6/10 during day.  Reports daily times without pain. He is not limited due to pain with amb functional and community distance but does continue to demonstrate an antalgic gait. Lumbar ROM improved without pain in most directions.  Muscle tightness persists in lumbar spine and hips. He is not using an AD. Overall he has made good progress. All STGs met.  Most LTGs in progress.  He will continue to benefit from skilled PT intervention progressing toward meeting remaining set goals.   OBJECTIVE IMPAIRMENTS: decreased activity tolerance, difficulty walking, decreased balance, decreased endurance, decreased mobility, decreased ROM, decreased strength, impaired flexibility, impaired UE/LE use, postural dysfunction, and pain.  ACTIVITY LIMITATIONS: bending, lifting, carry, locomotion, cleaning, community activity, driving, and or occupation  PERSONAL FACTORS: Cardiac, Acid reflux, Anemia, Arthritis, ADD, BPPV, CAD, DM, Dyspnea on exertion, Osteoarthritis.  are also affecting patient's functional outcome.  REHAB POTENTIAL: Good  CLINICAL DECISION MAKING: Evolving/moderate complexity  EVALUATION COMPLEXITY: Moderate    GOALS: Short term PT Goals Target date: 03/06/24 Pt will be I and compliant with HEP. Baseline:  Goal status: Met 03/24/24 Pt will decrease pain by 25% overall Baseline: Goal status: Met 03/24/24  Long term PT goals  Target date: 04/24/2024 Pt will improve ROM to [facility] to improve functional mobility Baseline: Goal status: Improving 04/03/24 Pt will improve hip/knee strength to at least 5-/5 MMT to improve functional strength Baseline: Goal status: New Pt will improve OSWESTRY to at least % functional to show improved function Baseline: Goal status: New Pt will reduce pain by overall 50% overall with usual activity Baseline: Goal status: MET  04/03/24 Pt will reduce pain to overall less than 3-4/10 with usual activity and work activity. Baseline: Goal status: In progress 04/03/24 Pt will be able to ambulate on uneven surfaces at least 500 ft WNL gait pattern without complaints Baseline: Goal status: In progress 04/03/24   7. Pt will be able to perform SLS for 30 seconds on each leg without LOB.  Baseline: 3 sec Lt, 5 sec Rt  Goal status: In progress - 04/19/24  PLAN: PT FREQUENCY: 1-3 times per week   PT DURATION: 6-8 weeks  PLANNED INTERVENTIONS (unless contraindicated): aquatic PT, Canalith repositioning, cryotherapy, Electrical stimulation, Iontophoresis with 4 mg/ml dexamethasome, Moist heat, traction, Ultrasound, gait training, Therapeutic exercise, balance training, neuromuscular re-education, patient/family education, prosthetic training, manual techniques, passive ROM, dry needling, taping, vasopnuematic device, vestibular, spinal manipulations, joint manipulations  PLAN FOR NEXT SESSION: Assess HEP/update PRN, continue to progress functional mobility, work on balance, work on designer, jewellery.  Core control.   Delon Aquas, PTA 04/19/2024 10:47 AM [facility] [address] [place], [place], [address] Phone: [phone]   Fax:  [phone]

## 2024-04-20 ENCOUNTER — Other Ambulatory Visit: Payer: Self-pay | Admitting: "Endocrinology

## 2024-04-20 ENCOUNTER — Other Ambulatory Visit: Payer: Self-pay | Admitting: Family Medicine

## 2024-04-20 ENCOUNTER — Encounter: Payer: Self-pay | Admitting: Gastroenterology

## 2024-04-20 ENCOUNTER — Other Ambulatory Visit: Payer: Self-pay

## 2024-04-20 DIAGNOSIS — E291 Testicular hypofunction: Secondary | ICD-10-CM

## 2024-04-20 DIAGNOSIS — E119 Type 2 diabetes mellitus without complications: Secondary | ICD-10-CM

## 2024-04-20 MED ORDER — TESTOSTERONE 50 MG/5GM (1%) TD GEL
2.0000 g | Freq: Every day | TRANSDERMAL | 5 refills | Status: AC
  Filled 2024-04-20: qty 300, 30d supply, fill #0

## 2024-04-21 ENCOUNTER — Encounter (HOSPITAL_BASED_OUTPATIENT_CLINIC_OR_DEPARTMENT_OTHER): Payer: Self-pay | Admitting: Physical Therapy

## 2024-04-21 ENCOUNTER — Ambulatory Visit (HOSPITAL_BASED_OUTPATIENT_CLINIC_OR_DEPARTMENT_OTHER): Admitting: Physical Therapy

## 2024-04-21 DIAGNOSIS — R29898 Other symptoms and signs involving the musculoskeletal system: Secondary | ICD-10-CM

## 2024-04-21 DIAGNOSIS — M6281 Muscle weakness (generalized): Secondary | ICD-10-CM | POA: Diagnosis not present

## 2024-04-21 DIAGNOSIS — G8929 Other chronic pain: Secondary | ICD-10-CM

## 2024-04-21 NOTE — Therapy (Signed)
 " OUTPATIENT PHYSICAL THERAPY THORACOLUMBAR TREATMENT  Patient Name: Reginald Rios MRN: [id number] DOB:[birth year], 68 y.o., male Today's Date: 04/21/2024  END OF SESSION:     PT End of Session - 04/21/24 1311     Visit Number 13    Date for Recertification  04/24/24    Authorization Type [employer]    Progress Note Due on Visit 19    PT Start Time 1300    PT Stop Time 1340    PT Time Calculation (min) 40 min    Behavior During Therapy [facility] for tasks assessed/performed            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   BPPV (benign paroxysmal positional vertigo) 08/14/2014   CAD (coronary artery disease)    2/19 PCI/DESx1 to Lcx   Cervical disc disorder with radiculopathy of cervical region 10/07/2015   Cervical radicular pain 01/10/2019   Chronic cough 04/13/2019   Decreased cardiac ejection fraction 04/02/2015   Diabetes mellitus without complication (HCC)    Dyspnea on exertion 05/15/2015   Erectile dysfunction 07/11/2019   Exercise-induced asthma    Family history of early CAD 07/05/2017   Fatigue 05/15/2015   Hallux rigidus, right foot 09/15/2021   Heart murmur    In early childhood. No issues now   Hemarthrosis of right elbow 02/03/2016   Hoarseness 04/13/2019   Hyperlipidemia    family hx of high cholesterol   Hypertension    Hypogonadism in male    Incomplete rotator cuff tear 09/10/2015   Injected 09/10/2015   Lateral epicondylitis of right elbow 07/28/2019   Low back pain at multiple sites 02/05/2021   Low vitamin D  level 04/22/2017   Mild  persistent asthma/cough variant asthma 04/13/2019   Myofascial pain syndrome, cervical 07/28/2019   Neck pain 06/01/2018   Nonischemic cardiomyopathy (HCC) 05/15/2015   Nontraumatic incomplete tear of right rotator cuff 07/25/2019   Occipital headache 06/01/2018   Onychomycosis 05/25/2018   Osteoarthritis    Pain in right ankle and joints of right foot 10/05/2016   Palpitations 07/06/2017   Pernicious anemia    PONV (postoperative nausea and vomiting)    Preop cardiovascular exam 10/18/2023   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   Shoulder bursitis 10/12/2014   Injected in 10/12/2014  Repeat 01/21/2015   Small thenar eminence 02/03/2016   Spinal stenosis, lumbar region, without neurogenic claudication 10/12/2014   Spondylolisthesis at L5-S1 level 05/09/2021   Strain of latissimus dorsi muscle 01/21/2015   Tendinopathy of right biceps tendon 07/25/2019   Type 2 diabetes mellitus with hyperglycemia, without long-term current use of insulin  (HCC) 12/22/2013   Ulnar neuropathy at elbow of right upper extremity 01/03/2019   Past Surgical History:  Procedure Laterality Date   CATARACT EXTRACTION W/ INTRAOCULAR LENS IMPLANT Bilateral 2023   COLONOSCOPY     CORONARY PRESSURE/FFR STUDY N/A 06/23/2017   Procedure: INTRAVASCULAR PRESSURE WIRE/FFR STUDY;  Surgeon: Anner Alm ORN, MD;  Location: [facility];  Service: Cardiovascular;  Laterality: N/A;   CORONARY STENT INTERVENTION N/A 06/23/2017   Procedure: CORONARY STENT INTERVENTION;  Surgeon: Anner Alm ORN, MD;  Location: [facility];  Service: Cardiovascular;  Laterality: N/A;   KNEE ARTHROSCOPY Left    LEFT HEART CATH AND CORONARY ANGIOGRAPHY N/A 06/23/2017   Procedure: LEFT HEART CATH AND CORONARY ANGIOGRAPHY;  Surgeon: Anner Alm ORN, MD;  Location: [facility] INVASIVE CV LAB;  Service: Cardiovascular;  Laterality: N/A;   LEFT HEART CATH AND CORONARY ANGIOGRAPHY N/A 07/14/2023   Procedure: LEFT HEART CATH AND CORONARY ANGIOGRAPHY;   Surgeon: Elmira Newman PARAS, MD;  Location: [facility];  Service: Cardiovascular;  Laterality: N/A;   SHOULDER ARTHROSCOPY WITH BICEPSTENOTOMY Right 09/06/2019   Procedure: SHOULDER ARTHROSCOPY WITH BICEPSTENOTOMY;  Surgeon: Jerri Kay HERO, MD;  Location: [facility];  Service: Orthopedics;  Laterality: Right;   SHOULDER ARTHROSCOPY WITH SUBACROMIAL DECOMPRESSION Right 09/06/2019   Procedure: RIGHT SHOULDER ARTHROSCOPY WITH EXTENSIVE DEBRIDEMENT, SUBACROMIAL DECOMPRESSION, BICEPS TENOTOMY;  Surgeon: Jerri Kay HERO, MD;  Location: [facility];  Service: Orthopedics;  Laterality: Right;   TENOTOMY ACHILLES TENDON  2007   In [place], [place]   ULNAR NERVE TRANSPOSITION  2006   In [place], [place]   Patient Active Problem List   Diagnosis Date Noted   Injection site reaction 03/14/2024   Hypertension    PONV (postoperative nausea and vomiting)    Preop cardiovascular exam 10/18/2023   Pernicious anemia    Hallux rigidus, right foot 09/15/2021   Spondylolisthesis at L5-S1 level 05/09/2021   Low back pain at multiple sites 02/05/2021   Osteoarthritis    Heart murmur    Exercise-induced asthma    Diabetes mellitus without complication (HCC)    Arthritis    Anemia    Myofascial pain syndrome, cervical 07/28/2019   Lateral epicondylitis of right elbow 07/28/2019   Nontraumatic incomplete tear of right rotator cuff 07/25/2019   Tendinopathy of right biceps tendon 07/25/2019   Erectile dysfunction 07/11/2019   Mild persistent asthma/cough variant asthma 04/13/2019   Allergic rhinitis with a possible nonallergic component 04/13/2019   Acid reflux 04/13/2019   Chronic cough 04/13/2019   Hoarseness 04/13/2019   Cervical radicular pain 01/10/2019   Ulnar neuropathy at elbow of right upper extremity 01/03/2019   Acute bilateral low back pain without sciatica 01/03/2019   Neck pain 06/01/2018   Occipital headache 06/01/2018   Onychomycosis 05/25/2018    Palpitations 07/06/2017   CAD (coronary artery disease) 07/05/2017   Family history of early CAD 07/05/2017   Abnormal findings on diagnostic imaging of cardiovascular system 06/23/2017   Low vitamin D  level 04/22/2017   Pain in right ankle and joints of right foot 10/05/2016   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   Hemarthrosis of right elbow 02/03/2016   Small thenar eminence 02/03/2016   Cervical disc disorder with radiculopathy of cervical region 10/07/2015   Incomplete rotator cuff tear 09/10/2015   Nonischemic cardiomyopathy (HCC) 05/15/2015   Fatigue 05/15/2015   Dyspnea on exertion 05/15/2015   Decreased cardiac ejection fraction 04/02/2015   Strain of latissimus dorsi muscle 01/21/2015   Shoulder bursitis 10/12/2014   Spinal stenosis, lumbar region, without neurogenic claudication 10/12/2014   BPPV (benign paroxysmal positional vertigo) 08/14/2014   Hyperlipidemia 01/19/2014   Anemia, unspecified 01/19/2014   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   Type 2 diabetes mellitus with hyperglycemia, without long-term current use of insulin  (HCC) 12/22/2013   Hypogonadism in male 12/22/2013   Arthritis of hand, left 12/22/2013    PCP: Frann Mabel Mt, DO  REFERRING PROVIDER: Colon Shove, MD  REFERRING DIAG: Spondylolisthesis, lumbosacral region [  M43.17]   Rationale for Evaluation and Treatment: Rehabilitation  THERAPY DIAG:  Muscle weakness (generalized)  Chronic bilateral low back pain with right-sided sciatica  Other symptoms and signs involving the musculoskeletal system  ONSET DATE: 10/25/23 surgery date.  SUBJECTIVE:                                                                                                                                                                                           SUBJECTIVE STATEMENT: Pt reports he felt good after last session; pain remained low the rest of day.  He reports he may have overdone it  in pool next day on own.      Eval: Pt was playing pickleball at [employer] in January 2025l. He reports having pain following game and was unable to walk. Started having legs give way. Pain has been consistent prior/ after surgery, but no longer shooting pain. He also noted numbness in bilat LE's into feet. He states that numbness is new. My doctor says  I cannot do anymore damage at this point, He also said not to twist, I don't twist anymore as it flares everything up. I have tried walking on the beach and on uneven surfaces and it continues to cause a lot of pain and difficulty.   PERTINENT HISTORY:  Cardiac, Acid reflux, Anemia, Arthritis, ADD, BPPV, CAD, DM, Dyspnea on exertion, Osteoarthritis.     PAIN:  Are you having pain? Yes: NPRS scale: 7/10 Pain location: center of back dull ache and into both hips Pain description: Achy pain, sharp pain Aggravating factors: twisting, walking on uneven surfaces.  Relieving factors: Resting.   PRECAUTIONS: None  RED FLAGS: None   WEIGHT BEARING RESTRICTIONS: No  FALLS:  Has patient fallen in last 6 months? No  LIVING ENVIRONMENT: Lives with: lives with their family Lives in: House/apartment Stairs: 24 stairs internally, 3 externally.  Has following equipment at home: None  OCCUPATION: Retired   PLOF: Independent  PATIENT GOALS: Pt would like to get back to walking.   NEXT MD VISIT: None currently.   OBJECTIVE:  Note: Objective measures were completed at Evaluation unless otherwise noted.  DIAGNOSTIC FINDINGS:  1. Status post L4-S1 discectomy and fusion without evidence of complication. The central canal and foramina appear widely patent at the surgical levels. 2. Mild to moderate central canal narrowing at L2-3 and L3-4. 3. Aortic atherosclerosis.   Aortic Atherosclerosis (ICD10-I70.0).  PATIENT SURVEYS:  Oswestry: 21/50 04/03/24: 18/50  COGNITION: Overall cognitive status: Within functional limits for tasks  assessed     SENSATION: WFL   POSTURE: rounded shoulders and forward head  PALPATION: Tenderness across  lower back.   LUMBAR ROM:   AROM eval 04/03/24  Flexion 15%* hip flexion mostly.    Extension 10%  No change no pain  Right lateral flexion Pt denies movement due to pain 90% limited pain  Left lateral flexion Pt denies movement due to pain 75% limited no pain  Right rotation Pt denies movement due to pain Full no pain  Left rotation Pt denies movement due to pain Full no pain   (Blank rows = not tested)  LOWER EXTREMITY ROM:     Active  Right eval Left eval R / L 04/03/24  Hip flexion WFL with reports of pain in lower back  [facility] with reports of pain in lower back  Full no pain  Hip extension     Hip abduction     Hip adduction     Hip internal rotation     Hip external rotation [facility]   Knee flexion [facility]  [facility]    Knee extension WFL with reports of pain in lower back [facility] with reports of pain in lower back No pain   (Blank rows = not tested)  LOWER EXTREMITY MMT:    MMT Right eval Left eval  Hip flexion    Hip extension    Hip abduction    Hip adduction    Hip internal rotation    Hip external rotation 4 4  Knee flexion    Knee extension     (Blank rows = not tested)   FUNCTIONAL TESTS:  5 times sit to stand: 26.22 seconds.  04/03/24: 5x STS -18.58  04/19/24:  R SLS 5 sec, L SLS 3 sec  GAIT: Distance walked: 69ft   Assistive device utilized: None Level of assistance: Complete Independence Comments: Pt walks with a slow antalgic gait. Minimal rotation.  04/03/24: improved cadence, offloading left/antalgic  TREATMENT DATE:  [facility] Adult PT Treatment:                                             Date: 04/21/24 Pt seen for aquatic therapy today.  Treatment took place in water 3.5-4.75 ft in depth at the [facility] pool. Temp of water was 91.  Pt entered/exited the pool via stairs independently with bil rail.  - unsupported walking forward/  backward with arm swing - side step into wide squat with arm add/abdct with yellow hand floats - <4 ft SLS with skulling to steady x 30 sec each; hands out of water -up to 10 seconds; tandem stance (challenge!) - return to marching with reciprocal row, forward/ backward - suitcase carry with bil yellow under water walking forward backward - wood chop motion with UE (no floats) x 10 each direction - mod cues for form - marching forward/backward with reciprocal row motion with rainbow hand floats - wall push up/off x 10 - gentle trunk rotation -  squat pushing yellow hand float under water 2x10 - STS from 3rd step x 10 cues for neutral head and forward arm reach, control descent - straddling noodle extra noodle under arms and no UE support: cycling  - decompression position with yellow noodle under arms behind back and noodle under knees  [facility] Adult PT Treatment:  Date: 04/19/24 Pt seen for aquatic therapy today.  Treatment took place in water 3.5-4.75 ft in depth at the [facility] pool. Temp of water was 91.  Pt entered/exited the pool via stairs independently with bil rail.  -test SLS on land prior to entry - unsupported walking forward/ backward with arm swing - suitcase carry with bil/single rainbow under water walking forward backward - side step into wide squat with arm add/abdct with rainbow hand floats - UE on rainbow hand floats: leg swings into hip flex/extension x10; hip abdct/ add  x 10  - squat pushing rainbow hand float under water x10-> yellow hand float x 10 - marching forward/backward with reciprocal row motion with rainbow hand floats - wood chop motion with UE (no floats) x 10 each direction - mod cues for form - straddling noodle extra noodle under arms and no UE support: cycling  - SLS in 26ft for 25 sec (skulling to steady) x 2 each side - marching forward/backward with reciprocal row motion  - STS from 3rd step x 6  cues for neutral head and forward arm reach, control descent - R/L hamstring stretch with heel on 2nd step and back straight 20 sec each -> swivel on standing foot for adductor stretch;  figure 4 stretch at steps, 15 sec x 2 each LE; L stretch with wag the tail   [facility] Adult PT Treatment:                                             Date: 04/14/24 Pt seen for aquatic therapy today.  Treatment took place in water 3.5-4.75 ft in depth at the [facility] pool. Temp of water was 91.  Pt entered/exited the pool via stairs independently with bil rail.  - unsupported walking forward/ backward  - side stepping with arm add/abdct with rainbow hand floats->- side step into wide squat  - UE on rainbow hand floats: leg swings into hip flex/extension 2x10; hip abdct/ add  x 10 (good balance challenge) - marching forward/backward with reciprocal row motion with rainbow hand floats - STS from 3rd step x 10 cues for neutral head and forward arm reach, control descent - suitcase carry with bil/single rainbow under water - marching forward/backward with reciprocal row motion with rainbow hand floats - wood chop motion with UE (no floats) x 6 each direction - straddling noodle extra noodle under arms and no UE support: cycling - R/L hamstring stretch with heel on 2nd step and back straight 20 sec each -> swivel on standing foot for adductor stretch;  figure 4 stretch at steps, 15 sec x 2 each LE  [facility] Adult PT Treatment:                                             Date: 04/11/24 Pt seen for aquatic therapy today.  Treatment took place in water 3.5-4.75 ft in depth at the [facility] pool. Temp of water was 91.  Pt entered/exited the pool via stairs independently with bil rail.  - unsupported walking forward/ backward and side stepping - side stepping with arm add/abdct with yellow hand floats->- side step into wide squat with arm add/abdct with yellow hand floats - UE on yellow hand floats: leg  swings into hip flex/extension x10; hip abdct/ add crossing midline x 10 (good balance challenge) - return to walking forward/ backward -> marching forward/backward-> with suitcase carry and bil yellow under water - staggered stance with TrA set and long hollow noodle pull down to thighs x 10 each LE forward, (good balance challenge) - forward walking kicks - wide stance with gentle trunk rotation arms out to side -plank at bench in water with alternating hip extension x 5 each; fire hydrant hips x 5 each - L stretch with gentle tail wag  - figure 4 stretch at steps, 30sec x 2 each LE - STS from 3rd step x 2x5 cues for neutral head and forward arm reach, control descent - straddling noodle with UE support on corner: cycling; hip abdct/ add; hip flexion/ext -> extra noodle under arms and no UE support- repeated all 3 exercises  - R/L hamstring stretch with heel on 2nd step and back straight 20 sec each -> swivel on standing foot for adductor stretch   [facility] Adult PT Treatment:                                             Date: 04/03/24 Pt seen for aquatic therapy today.  Treatment took place in water 3.5-4.75 ft in depth at the [facility] pool. Temp of water was 91.  Pt entered/exited the pool via stairs independently with bil rail.  - unsupported walking forward/ backward and side stepping - side stepping with arm add/abdct with rainbow hand floats->- side step into wide squat with arm add/abdct with yellow hand floats - L stretch with gentle tail wag  - STS from 3rd step x 2x5 cues for neutral head and forward arm reach, control descent - figure 4 stretch at steps - UE On wall: leg swings into hip flex/extension x10; hip abdct/ add x 10  - straddling noodle with UE support on corner: cycling  - R/L hamstring stretch with heel on 2nd step and back straight 20 sec each -> swivel on standing foot for adductor stretch  [facility] Adult PT Treatment:                                              Date: 03/28/24 Pt seen for aquatic therapy today.  Treatment took place in water 3.5-4.75 ft in depth at the [facility] pool. Temp of water was 91.  Pt entered/exited the pool via stairs independently with bil rail.  - unsupported walking forward/ backward and side stepping - side stepping with arm add/abdct with rainbow hand floats  - L stretch with gentle tail wag  - UE on wall:split squats x 5 each LE forward ->  forward walking split squats with yellow hand floats - side step into wide squat with arm add/abdct with yellow hand floats - STS from 3rd step x 8, cues for neutral head and forward arm reach, control descent - UE On wall: leg swings into hip flex/extension x10; hip abdct/ add x 10  - straddling noodle with UE support on corner: cycling - R/L hamstring stretch with heel on 2nd step and back straight 20 sec each -> swivel on standing foot for adductor stretch    PATIENT EDUCATION:  Education details: Educated pt on  anatomy and physiology of current symptoms, intro to aquatic therapy Person educated: Patient Education method: Explanation, Demonstration Education comprehension: verbalized understanding and returned demonstration  HOME EXERCISE PROGRAM: Access Code: [email or web address] URL: [email or web address] Date: 02/21/2024 Prepared by: Rojean Batten  Exercises - Supine Lower Trunk Rotation  - 1-2 x daily - 7 x weekly - 2-3 sets - 10 reps - Supine Hip Adduction Isometric with Ball  - 1-2 x daily - 7 x weekly - 2-3 sets - 10 reps   ASSESSMENT:  CLINICAL IMPRESSION:  Good toleration of progression of exercises completed today with gradual elimination of pain.  Improved form and and tolerance for wood chop motion in 4+ ft; requires only minor cues.  SLS continues to be challenging in water without UE skulling. Will plan to instruct on aquatic HEP next visit.   Pt voiced interest in returning to land based exercise; anticipate transition with land/water at  end of cert.  Pt is making good progress towards remaining goals. Therapist to assess goals next visit.    Progress note: pt reports his pain with daily activities is down ~ 60% although he does continue to have pain as high as 6/10 during day.  Reports daily times without pain. He is not limited due to pain with amb functional and community distance but does continue to demonstrate an antalgic gait. Lumbar ROM improved without pain in most directions.  Muscle tightness persists in lumbar spine and hips. He is not using an AD. Overall he has made good progress. All STGs met.  Most LTGs in progress.  He will continue to benefit from skilled PT intervention progressing toward meeting remaining set goals.   OBJECTIVE IMPAIRMENTS: decreased activity tolerance, difficulty walking, decreased balance, decreased endurance, decreased mobility, decreased ROM, decreased strength, impaired flexibility, impaired UE/LE use, postural dysfunction, and pain.  ACTIVITY LIMITATIONS: bending, lifting, carry, locomotion, cleaning, community activity, driving, and or occupation  PERSONAL FACTORS: Cardiac, Acid reflux, Anemia, Arthritis, ADD, BPPV, CAD, DM, Dyspnea on exertion, Osteoarthritis.  are also affecting patient's functional outcome.  REHAB POTENTIAL: Good  CLINICAL DECISION MAKING: Evolving/moderate complexity  EVALUATION COMPLEXITY: Moderate    GOALS: Short term PT Goals Target date: 03/06/24 Pt will be I and compliant with HEP. Baseline:  Goal status: Met 03/24/24 Pt will decrease pain by 25% overall Baseline: Goal status: Met 03/24/24  Long term PT goals Target date: 04/24/2024 Pt will improve ROM to [facility] to improve functional mobility Baseline: Goal status: Improving 04/03/24 Pt will improve hip/knee strength to at least 5-/5 MMT to improve functional strength Baseline: Goal status: New Pt will improve OSWESTRY to at least % functional to show improved function Baseline: Goal status:  New Pt will reduce pain by overall 50% overall with usual activity Baseline: Goal status: MET 04/03/24 Pt will reduce pain to overall less than 3-4/10 with usual activity and work activity. Baseline: Goal status: In progress 04/03/24 Pt will be able to ambulate on uneven surfaces at least 500 ft WNL gait pattern without complaints Baseline: Goal status: In progress 04/03/24   7. Pt will be able to perform SLS for 30 seconds on each leg without LOB.  Baseline: 3 sec Lt, 5 sec Rt  Goal status: In progress - 04/19/24  PLAN: PT FREQUENCY: 1-3 times per week   PT DURATION: 6-8 weeks  PLANNED INTERVENTIONS (unless contraindicated): aquatic PT, Canalith repositioning, cryotherapy, Electrical stimulation, Iontophoresis with 4 mg/ml dexamethasome, Moist heat, traction, Ultrasound, gait training, Therapeutic exercise, balance training, neuromuscular re-education, patient/family education, prosthetic training,  manual techniques, passive ROM, dry needling, taping, vasopnuematic device, vestibular, spinal manipulations, joint manipulations  PLAN FOR NEXT SESSION: Assess HEP/update PRN, continue to progress functional mobility, work on balance, work on designer, jewellery.  Core control.   Delon Aquas, PTA 04/21/2024 2:12 PM [facility] [address] [place], [place], [address] Phone: [phone]   Fax:  [phone]  "

## 2024-04-24 NOTE — Therapy (Signed)
 " OUTPATIENT PHYSICAL THERAPY THORACOLUMBAR TREATMENT/Re-Cert  Patient Name: Reginald Rios MRN: [id number] DOB:Oct 01, 1955, 68 y.o., male Today's Date: 04/25/2024  END OF SESSION:     PT End of Session - 04/25/24 1449     Visit Number 14    Date for Recertification  06/23/24    Authorization Type [employer]    Progress Note Due on Visit 24    PT Start Time 1447    PT Stop Time 1530    PT Time Calculation (min) 43 min    Activity Tolerance Patient tolerated treatment well    Behavior During Therapy [facility] for tasks assessed/performed             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   BPPV (benign paroxysmal positional vertigo) 08/14/2014   CAD (coronary artery disease)    2/19 PCI/DESx1 to Lcx   Cervical disc disorder with radiculopathy of cervical region 10/07/2015   Cervical radicular pain 01/10/2019   Chronic cough 04/13/2019   Decreased cardiac ejection fraction 04/02/2015   Diabetes mellitus without complication (HCC)    Dyspnea on exertion 05/15/2015   Erectile dysfunction 07/11/2019   Exercise-induced asthma    Family history of early CAD 07/05/2017   Fatigue 05/15/2015   Hallux rigidus, right foot 09/15/2021   Heart murmur    In early childhood. No issues now   Hemarthrosis of right elbow 02/03/2016   Hoarseness 04/13/2019   Hyperlipidemia    family hx of high cholesterol   Hypertension    Hypogonadism in male    Incomplete rotator cuff tear 09/10/2015   Injected 09/10/2015   Lateral epicondylitis of right elbow 07/28/2019   Low back pain at multiple  sites 02/05/2021   Low vitamin D  level 04/22/2017   Mild persistent asthma/cough variant asthma 04/13/2019   Myofascial pain syndrome, cervical 07/28/2019   Neck pain 06/01/2018   Nonischemic cardiomyopathy (HCC) 05/15/2015   Nontraumatic incomplete tear of right rotator cuff 07/25/2019   Occipital headache 06/01/2018   Onychomycosis 05/25/2018   Osteoarthritis    Pain in right ankle and joints of right foot 10/05/2016   Palpitations 07/06/2017   Pernicious anemia    PONV (postoperative nausea and vomiting)    Preop cardiovascular exam 10/18/2023   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   Shoulder bursitis 10/12/2014   Injected in 10/12/2014  Repeat 01/21/2015   Small thenar eminence 02/03/2016   Spinal stenosis, lumbar region, without neurogenic claudication 10/12/2014   Spondylolisthesis at L5-S1 level 05/09/2021   Strain of latissimus dorsi muscle 01/21/2015   Tendinopathy of right biceps tendon 07/25/2019   Type 2 diabetes mellitus with hyperglycemia, without long-term current use of insulin  (HCC) 12/22/2013   Ulnar neuropathy at elbow of right upper extremity 01/03/2019   Past Surgical History:  Procedure Laterality Date   CATARACT EXTRACTION W/ INTRAOCULAR LENS IMPLANT Bilateral 2023   COLONOSCOPY     CORONARY PRESSURE/FFR STUDY N/A 06/23/2017   Procedure: INTRAVASCULAR PRESSURE WIRE/FFR STUDY;  Surgeon: Anner Alm ORN, MD;  Location: [facility] INVASIVE CV LAB;  Service: Cardiovascular;  Laterality: N/A;   CORONARY STENT INTERVENTION N/A 06/23/2017  Procedure: CORONARY STENT INTERVENTION;  Surgeon: Anner Alm ORN, MD;  Location: [facility] INVASIVE CV LAB;  Service: Cardiovascular;  Laterality: N/A;   KNEE ARTHROSCOPY Left    LEFT HEART CATH AND CORONARY ANGIOGRAPHY N/A 06/23/2017   Procedure: LEFT HEART CATH AND CORONARY ANGIOGRAPHY;  Surgeon: Anner Alm ORN, MD;  Location: [facility] INVASIVE CV LAB;  Service: Cardiovascular;  Laterality: N/A;   LEFT HEART CATH AND CORONARY ANGIOGRAPHY N/A  07/14/2023   Procedure: LEFT HEART CATH AND CORONARY ANGIOGRAPHY;  Surgeon: Elmira Newman PARAS, MD;  Location: [facility] INVASIVE CV LAB;  Service: Cardiovascular;  Laterality: N/A;   SHOULDER ARTHROSCOPY WITH BICEPSTENOTOMY Right 09/06/2019   Procedure: SHOULDER ARTHROSCOPY WITH BICEPSTENOTOMY;  Surgeon: Jerri Kay HERO, MD;  Location: [facility];  Service: Orthopedics;  Laterality: Right;   SHOULDER ARTHROSCOPY WITH SUBACROMIAL DECOMPRESSION Right 09/06/2019   Procedure: RIGHT SHOULDER ARTHROSCOPY WITH EXTENSIVE DEBRIDEMENT, SUBACROMIAL DECOMPRESSION, BICEPS TENOTOMY;  Surgeon: Jerri Kay HERO, MD;  Location: [facility];  Service: Orthopedics;  Laterality: Right;   TENOTOMY ACHILLES TENDON  2007   In [place], [place]   ULNAR NERVE TRANSPOSITION  2006   In [place], [place]   Patient Active Problem List   Diagnosis Date Noted   Injection site reaction 03/14/2024   Hypertension    PONV (postoperative nausea and vomiting)    Preop cardiovascular exam 10/18/2023   Pernicious anemia    Hallux rigidus, right foot 09/15/2021   Spondylolisthesis at L5-S1 level 05/09/2021   Low back pain at multiple sites 02/05/2021   Osteoarthritis    Heart murmur    Exercise-induced asthma    Diabetes mellitus without complication (HCC)    Arthritis    Anemia    Myofascial pain syndrome, cervical 07/28/2019   Lateral epicondylitis of right elbow 07/28/2019   Nontraumatic incomplete tear of right rotator cuff 07/25/2019   Tendinopathy of right biceps tendon 07/25/2019   Erectile dysfunction 07/11/2019   Mild persistent asthma/cough variant asthma 04/13/2019   Allergic rhinitis with a possible nonallergic component 04/13/2019   Acid reflux 04/13/2019   Chronic cough 04/13/2019   Hoarseness 04/13/2019   Cervical radicular pain 01/10/2019   Ulnar neuropathy at elbow of right upper extremity 01/03/2019   Acute bilateral low back pain without sciatica 01/03/2019   Neck pain 06/01/2018    Occipital headache 06/01/2018   Onychomycosis 05/25/2018   Palpitations 07/06/2017   CAD (coronary artery disease) 07/05/2017   Family history of early CAD 07/05/2017   Abnormal findings on diagnostic imaging of cardiovascular system 06/23/2017   Low vitamin D  level 04/22/2017   Pain in right ankle and joints of right foot 10/05/2016   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   Hemarthrosis of right elbow 02/03/2016   Small thenar eminence 02/03/2016   Cervical disc disorder with radiculopathy of cervical region 10/07/2015   Incomplete rotator cuff tear 09/10/2015   Nonischemic cardiomyopathy (HCC) 05/15/2015   Fatigue 05/15/2015   Dyspnea on exertion 05/15/2015   Decreased cardiac ejection fraction 04/02/2015   Strain of latissimus dorsi muscle 01/21/2015   Shoulder bursitis 10/12/2014   Spinal stenosis, lumbar region, without neurogenic claudication 10/12/2014   BPPV (benign paroxysmal positional vertigo) 08/14/2014   Hyperlipidemia 01/19/2014   Anemia, unspecified 01/19/2014   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   Type 2 diabetes mellitus with hyperglycemia, without long-term current use of insulin  (HCC) 12/22/2013   Hypogonadism in male 12/22/2013   Arthritis of hand, left 12/22/2013    PCP: Frann Mabel Mt, DO  REFERRING PROVIDER: Colon Shove, MD  REFERRING DIAG: Spondylolisthesis, lumbosacral region [M43.17]   Rationale for Evaluation and Treatment: Rehabilitation  THERAPY DIAG:  Muscle weakness (generalized)  Chronic bilateral low back pain with right-sided sciatica  Other symptoms and signs involving the musculoskeletal system  ONSET DATE: 10/25/23 surgery date.  SUBJECTIVE:                                                                                                                                                                                           SUBJECTIVE STATEMENT: Pt reports his HS are much looser than since beginning  therapy.  Pain hangs around 4/10. Seems like it has spread out.  Its a good pain.  It does not stop me from doing anything just nagging. 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 Doing 2, 20 min walks a day with dog.     Eval: Pt was playing pickleball at [place] in January 2025l. He reports having pain following game and was unable to walk. Started having legs give way. Pain has been consistent prior/ after surgery, but no longer shooting pain. He also noted numbness in bilat LE's into feet. He states that numbness is new. My doctor says  I cannot do anymore damage at this point, He also said not to twist, I don't twist anymore as it flares everything up. I have tried walking on the beach and on uneven surfaces and it continues to cause a lot of pain and difficulty.   PERTINENT HISTORY:  Cardiac, Acid reflux, Anemia, Arthritis, ADD, BPPV, CAD, DM, Dyspnea on exertion, Osteoarthritis.     PAIN:  Are you having pain? Yes: NPRS scale: 7/10 Pain location: center of back dull ache and into both hips Pain description: Achy pain, sharp pain Aggravating factors: twisting, walking on uneven surfaces.  Relieving factors: Resting.   PRECAUTIONS: None  RED FLAGS: None   WEIGHT BEARING RESTRICTIONS: No  FALLS:  Has patient fallen in last 6 months? No  LIVING ENVIRONMENT: Lives with: lives with their family Lives in: House/apartment Stairs: 24 stairs internally, 3 externally.  Has following equipment at home: None  OCCUPATION: Retired   PLOF: Independent  PATIENT GOALS: Pt would like to get back to walking.   NEXT MD VISIT: None currently.   OBJECTIVE:  Note: Objective measures were completed at Evaluation unless otherwise noted.  DIAGNOSTIC FINDINGS:  1. Status post L4-S1 discectomy and fusion without evidence of complication. The central canal and foramina appear widely patent at the surgical levels. 2. Mild to moderate central canal narrowing at L2-3 and L3-4. 3. Aortic  atherosclerosis.   Aortic Atherosclerosis (  ICD10-I70.0).  PATIENT SURVEYS:  Oswestry: 21/50 04/03/24: 18/50  COGNITION: Overall cognitive status: Within functional limits for tasks assessed     SENSATION: WFL   POSTURE: rounded shoulders and forward head  PALPATION: Tenderness across lower back.   LUMBAR ROM:   AROM eval 04/03/24 04/25/24  Flexion 15%* hip flexion mostly.     Extension 10%  No change no pain   Right lateral flexion Pt denies movement due to pain 90% limited pain 75% limited pain  Left lateral flexion Pt denies movement due to pain 75% limited no pain 75% limited pain  Right rotation Pt denies movement due to pain Full no pain   Left rotation Pt denies movement due to pain Full no pain    (Blank rows = not tested)  LOWER EXTREMITY ROM:     Active  Right eval Left eval R / L 04/03/24  Hip flexion WFL with reports of pain in lower back  [facility] with reports of pain in lower back  Full no pain  Hip extension     Hip abduction     Hip adduction     Hip internal rotation     Hip external rotation [facility]   Knee flexion [facility]  [facility]    Knee extension WFL with reports of pain in lower back [facility] with reports of pain in lower back No pain   (Blank rows = not tested)  LOWER EXTREMITY MMT:    MMT Right eval Left eval R / L 12/23  Hip flexion     Hip extension     Hip abduction     Hip adduction     Hip internal rotation     Hip external rotation 4 4 5- cramp right hip flex  Knee flexion     Knee extension      (Blank rows = not tested)   FUNCTIONAL TESTS:  5 times sit to stand: 26.22 seconds.  04/03/24: 5x STS -18.58  04/19/24:  R SLS 5 sec, L SLS 3 sec 04/25/24: R SLS 6 sec, L SLS 4s  GAIT: Distance walked: 48ft   Assistive device utilized: None Level of assistance: Complete Independence Comments: Pt walks with a slow antalgic gait. Minimal rotation.  04/03/24: improved cadence, offloading left/antalgic  TREATMENT DATE:  [facility] Adult PT Treatment:                                              Date: 04/25/24  Testing for PN/re-cert: berg; 69d STS; MMT; ROM  Pt seen for aquatic therapy today.  Treatment took place in water 3.5-4.75 ft in depth at the [facility] pool. Temp of water was 91.  Pt entered/exited the pool via stairs independently with bil rail.  - unsupported walking forward/ backward with arm swing - side step into wide squat with arm add/abdct with yellow hand floats - suitcase carry with bil yellow under water walking forward backward - wood chop motion with UE (no floats) x 10 each direction - mod cues for form - wall push up/off 2x 10 - gentle trunk rotation - decompression position with yellow noodle under arms behind back and noodle under knees    PATIENT EDUCATION:  Education details: Educated pt on anatomy and physiology of current symptoms, intro to aquatic therapy Person educated: Patient Education method: Explanation, Demonstration Education comprehension: verbalized understanding and returned demonstration  HOME EXERCISE PROGRAM: Access Code: [email or web address] URL: [email or web address] Date: 02/21/2024 Prepared by: Rojean Batten  Exercises - Supine Lower Trunk Rotation  - 1-2 x daily - 7 x weekly - 2-3 sets - 10 reps - Supine Hip Adduction Isometric with Ball  - 1-2 x daily - 7 x weekly - 2-3 sets - 10 reps   ASSESSMENT:  CLINICAL IMPRESSION:  PN: Pt seen and all testing complete for PN and re-cert. As testing above demonstrates he has had an improvement in his strength and Rom of hip and lumbosacral area. He reports good response to aquatic intervention with no pain while submerged and ability to stretch well in setting but it does com back immediately upon exiting. He reports his pain is not limiting as he is able to do everything he wants at home and out. Pain is just nagging. He demonstrates decreased guarding with activity but continues with some level of fear avoidance.  He will  continue to benefit from skilled PT  with full transition onto land based intervention for optimal strengthening.  Final aquatic HEP to be instructed next session (not scheduled yet)    OBJECTIVE IMPAIRMENTS: decreased activity tolerance, difficulty walking, decreased balance, decreased endurance, decreased mobility, decreased ROM, decreased strength, impaired flexibility, impaired UE/LE use, postural dysfunction, and pain.  ACTIVITY LIMITATIONS: bending, lifting, carry, locomotion, cleaning, community activity, driving, and or occupation  PERSONAL FACTORS: Cardiac, Acid reflux, Anemia, Arthritis, ADD, BPPV, CAD, DM, Dyspnea on exertion, Osteoarthritis.  are also affecting patient's functional outcome.  REHAB POTENTIAL: Good  CLINICAL DECISION MAKING: Evolving/moderate complexity  EVALUATION COMPLEXITY: Moderate    GOALS: Short term PT Goals Target date: 03/06/24 Pt will be I and compliant with HEP. Baseline:  Goal status: Met 03/24/24 Pt will decrease pain by 25% overall Baseline: Goal status: Met 03/24/24  Long term PT goals Target date: 06/23/24  Pt will improve ROM to [facility] to improve functional mobility Baseline: Goal status: Improving 04/03/24; in progress 04/25/24 Pt will improve hip/knee strength to at least 5-/5 MMT to improve functional strength Baseline: Goal status: In Progress 04/25/24 Pt will improve OSWESTRY to at least 13% functional to show improved function Baseline:21/50=42%; 16/50=32% Goal status: In Progress 04/25/24 Pt will reduce pain by overall 50% overall with usual activity Baseline: Goal status: MET 04/03/24 Pt will reduce pain to overall less than 3-4/10 with usual activity and work activity. Baseline: Goal status: In progress 04/03/24/ 04/25/24 Pt will be able to ambulate on uneven surfaces at least 500 ft WNL gait pattern without complaints Baseline: Goal status: In progress 04/03/24; Met 04/25/24   7. Pt will be able to perform SLS for 30 seconds  on each leg without LOB.  Baseline: 3 sec Lt, 5 sec Rt  Goal status: In progress - 04/19/24; 04/25/24    8.pt will be indep with body mechanics for lifting  Baseline: apprehensive to try  Goal Status: NEW  PLAN: PT FREQUENCY: 1-3 times per week   PT DURATION: 6-8 weeks  PLANNED INTERVENTIONS (unless contraindicated): aquatic PT, Canalith repositioning, cryotherapy, Electrical stimulation, Iontophoresis with 4 mg/ml dexamethasome, Moist heat, traction, Ultrasound, gait training, Therapeutic exercise, balance training, neuromuscular re-education, patient/family education, prosthetic training, manual techniques, passive ROM, dry needling, taping, vasopnuematic device, vestibular, spinal manipulations, joint manipulations  PLAN FOR NEXT SESSION: Assess HEP/update PRN, continue to progress functional mobility, work on balance, work on designer, jewellery.  Core control.   Ronal Creighton) Kristi Hyer MPT 04/25/2024 5:14 PM [facility] [phone]  [place], [place], [address] Phone: [phone]   Fax:  [phone]   "

## 2024-04-25 ENCOUNTER — Ambulatory Visit (INDEPENDENT_AMBULATORY_CARE_PROVIDER_SITE_OTHER)

## 2024-04-25 ENCOUNTER — Ambulatory Visit (HOSPITAL_BASED_OUTPATIENT_CLINIC_OR_DEPARTMENT_OTHER): Admitting: Physical Therapy

## 2024-04-25 ENCOUNTER — Other Ambulatory Visit (HOSPITAL_BASED_OUTPATIENT_CLINIC_OR_DEPARTMENT_OTHER): Payer: Self-pay

## 2024-04-25 ENCOUNTER — Encounter (HOSPITAL_BASED_OUTPATIENT_CLINIC_OR_DEPARTMENT_OTHER): Payer: Self-pay | Admitting: Physical Therapy

## 2024-04-25 DIAGNOSIS — M6281 Muscle weakness (generalized): Secondary | ICD-10-CM | POA: Diagnosis not present

## 2024-04-25 DIAGNOSIS — E538 Deficiency of other specified B group vitamins: Secondary | ICD-10-CM

## 2024-04-25 DIAGNOSIS — R29898 Other symptoms and signs involving the musculoskeletal system: Secondary | ICD-10-CM

## 2024-04-25 DIAGNOSIS — G8929 Other chronic pain: Secondary | ICD-10-CM

## 2024-04-25 MED ORDER — CYANOCOBALAMIN 1000 MCG/ML IJ SOLN
1000.0000 ug | Freq: Once | INTRAMUSCULAR | Status: AC
  Administered 2024-04-25: 1000 ug via INTRAMUSCULAR

## 2024-04-25 NOTE — Progress Notes (Signed)
 Pt here for biweekly B12 injection per original order dated: 03/28/24, Dr. Frann Let's have him go back on shots every 2 weeks and recheck his B12 in 2 months.   Last B12 injection:04/12/24  Last B12 level: 03/28/2024   B12 1000mcg given IM, left deltoid and pt tolerated injection well.  Next B12 injection scheduled for: 05/09/2024

## 2024-04-28 ENCOUNTER — Other Ambulatory Visit: Payer: Self-pay

## 2024-04-28 ENCOUNTER — Other Ambulatory Visit: Payer: Self-pay | Admitting: Family Medicine

## 2024-04-28 ENCOUNTER — Other Ambulatory Visit (HOSPITAL_BASED_OUTPATIENT_CLINIC_OR_DEPARTMENT_OTHER): Payer: Self-pay

## 2024-04-28 NOTE — Telephone Encounter (Signed)
 Requesting: Ritalin  20 MG Contract: 08/09/2023 UDS: 07/09/2023 Last Visit: 03/14/2024 Next Visit: 08/08/2024 Last Refill: 04/09/2024  Please Advise

## 2024-04-28 NOTE — Telephone Encounter (Signed)
 Should already be 2 mo worth at pharm?

## 2024-04-30 NOTE — Therapy (Signed)
 " OUTPATIENT PHYSICAL THERAPY THORACOLUMBAR TREATMENT/Re-Cert  Patient Name: Reginald Rios MRN: [id number] DOB:[birth year], 68 y.o., male Today's Date: 04/30/2024  END OF SESSION:          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   BPPV (benign paroxysmal positional vertigo) 08/14/2014   CAD (coronary artery disease)    2/19 PCI/DESx1 to Lcx   Cervical disc disorder with radiculopathy of cervical region 10/07/2015   Cervical radicular pain 01/10/2019   Chronic cough 04/13/2019   Decreased cardiac ejection fraction 04/02/2015   Diabetes mellitus without complication (HCC)    Dyspnea on exertion 05/15/2015   Erectile dysfunction 07/11/2019   Exercise-induced asthma    Family history of early CAD 07/05/2017   Fatigue 05/15/2015   Hallux rigidus, right foot 09/15/2021   Heart murmur    In early childhood. No issues now   Hemarthrosis of right elbow 02/03/2016   Hoarseness 04/13/2019   Hyperlipidemia    family hx of high cholesterol   Hypertension    Hypogonadism in male    Incomplete rotator cuff tear 09/10/2015   Injected 09/10/2015   Lateral epicondylitis of right elbow 07/28/2019   Low back pain at multiple sites 02/05/2021   Low vitamin D  level 04/22/2017   Mild persistent asthma/cough variant asthma 04/13/2019   Myofascial pain syndrome, cervical 07/28/2019   Neck pain 06/01/2018   Nonischemic cardiomyopathy (HCC) 05/15/2015   Nontraumatic incomplete tear of right rotator cuff 07/25/2019   Occipital headache 06/01/2018   Onychomycosis 05/25/2018   Osteoarthritis    Pain  in right ankle and joints of right foot 10/05/2016   Palpitations 07/06/2017   Pernicious anemia    PONV (postoperative nausea and vomiting)    Preop cardiovascular exam 10/18/2023   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   Shoulder bursitis 10/12/2014   Injected in 10/12/2014  Repeat 01/21/2015   Small thenar eminence 02/03/2016   Spinal stenosis, lumbar region, without neurogenic claudication 10/12/2014   Spondylolisthesis at L5-S1 level 05/09/2021   Strain of latissimus dorsi muscle 01/21/2015   Tendinopathy of right biceps tendon 07/25/2019   Type 2 diabetes mellitus with hyperglycemia, without long-term current use of insulin  (HCC) 12/22/2013   Ulnar neuropathy at elbow of right upper extremity 01/03/2019   Past Surgical History:  Procedure Laterality Date   CATARACT EXTRACTION W/ INTRAOCULAR LENS IMPLANT Bilateral 2023   COLONOSCOPY     CORONARY PRESSURE/FFR STUDY N/A 06/23/2017   Procedure: INTRAVASCULAR PRESSURE WIRE/FFR STUDY;  Surgeon: Anner Alm ORN, MD;  Location: [facility] INVASIVE CV LAB;  Service: Cardiovascular;  Laterality: N/A;   CORONARY STENT INTERVENTION N/A 06/23/2017   Procedure: CORONARY STENT INTERVENTION;  Surgeon: Anner Alm ORN, MD;  Location: [facility] INVASIVE CV LAB;  Service: Cardiovascular;  Laterality: N/A;   KNEE ARTHROSCOPY Left    LEFT HEART CATH AND CORONARY ANGIOGRAPHY N/A 06/23/2017   Procedure: LEFT HEART CATH AND CORONARY ANGIOGRAPHY;  Surgeon: Anner Alm ORN, MD;  Location: [facility] INVASIVE CV LAB;  Service: Cardiovascular;  Laterality: N/A;   LEFT HEART CATH AND CORONARY ANGIOGRAPHY N/A 07/14/2023   Procedure: LEFT HEART CATH AND CORONARY ANGIOGRAPHY;  Surgeon: Elmira Penman  J, MD;  Location: [facility] INVASIVE CV LAB;  Service: Cardiovascular;  Laterality: N/A;   SHOULDER ARTHROSCOPY WITH BICEPSTENOTOMY Right 09/06/2019   Procedure: SHOULDER ARTHROSCOPY WITH BICEPSTENOTOMY;  Surgeon: Jerri Kay HERO, MD;  Location: [facility];  Service: Orthopedics;   Laterality: Right;   SHOULDER ARTHROSCOPY WITH SUBACROMIAL DECOMPRESSION Right 09/06/2019   Procedure: RIGHT SHOULDER ARTHROSCOPY WITH EXTENSIVE DEBRIDEMENT, SUBACROMIAL DECOMPRESSION, BICEPS TENOTOMY;  Surgeon: Jerri Kay HERO, MD;  Location: [facility];  Service: Orthopedics;  Laterality: Right;   TENOTOMY ACHILLES TENDON  2007   In [place], [place]   ULNAR NERVE TRANSPOSITION  2006   In [place], [place]   Patient Active Problem List   Diagnosis Date Noted   Injection site reaction 03/14/2024   Hypertension    PONV (postoperative nausea and vomiting)    Preop cardiovascular exam 10/18/2023   Pernicious anemia    Hallux rigidus, right foot 09/15/2021   Spondylolisthesis at L5-S1 level 05/09/2021   Low back pain at multiple sites 02/05/2021   Osteoarthritis    Heart murmur    Exercise-induced asthma    Diabetes mellitus without complication (HCC)    Arthritis    Anemia    Myofascial pain syndrome, cervical 07/28/2019   Lateral epicondylitis of right elbow 07/28/2019   Nontraumatic incomplete tear of right rotator cuff 07/25/2019   Tendinopathy of right biceps tendon 07/25/2019   Erectile dysfunction 07/11/2019   Mild persistent asthma/cough variant asthma 04/13/2019   Allergic rhinitis with a possible nonallergic component 04/13/2019   Acid reflux 04/13/2019   Chronic cough 04/13/2019   Hoarseness 04/13/2019   Cervical radicular pain 01/10/2019   Ulnar neuropathy at elbow of right upper extremity 01/03/2019   Acute bilateral low back pain without sciatica 01/03/2019   Neck pain 06/01/2018   Occipital headache 06/01/2018   Onychomycosis 05/25/2018   Palpitations 07/06/2017   CAD (coronary artery disease) 07/05/2017   Family history of early CAD 07/05/2017   Abnormal findings on diagnostic imaging of cardiovascular system 06/23/2017   Low vitamin D  level 04/22/2017   Pain in right ankle and joints of right foot 10/05/2016   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    Hemarthrosis of right elbow 02/03/2016   Small thenar eminence 02/03/2016   Cervical disc disorder with radiculopathy of cervical region 10/07/2015   Incomplete rotator cuff tear 09/10/2015   Nonischemic cardiomyopathy (HCC) 05/15/2015   Fatigue 05/15/2015   Dyspnea on exertion 05/15/2015   Decreased cardiac ejection fraction 04/02/2015   Strain of latissimus dorsi muscle 01/21/2015   Shoulder bursitis 10/12/2014   Spinal stenosis, lumbar region, without neurogenic claudication 10/12/2014   BPPV (benign paroxysmal positional vertigo) 08/14/2014   Hyperlipidemia 01/19/2014   Anemia, unspecified 01/19/2014   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   Type 2 diabetes mellitus with hyperglycemia, without long-term current use of insulin  (HCC) 12/22/2013   Hypogonadism in male 12/22/2013   Arthritis of hand, left 12/22/2013    PCP: Frann Mabel Mt, DO  REFERRING PROVIDER: Colon Shove, MD  REFERRING DIAG: Spondylolisthesis, lumbosacral region [M43.17]   Rationale for Evaluation and Treatment: Rehabilitation  THERAPY DIAG:  No diagnosis found.  ONSET DATE: 10/25/23 surgery date.  SUBJECTIVE:  SUBJECTIVE STATEMENT: Pt reports his HS are much looser than since beginning therapy.  Pain hangs around 4/10. Seems like it has spread out.  Its a good pain.  It does not stop me from doing anything just nagging. 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 Doing 2, 20 min walks a day with dog.  6 months s/p---    Eval: Pt was playing pickleball at [place] in January 2025l. He reports having pain following game and was unable to walk. Started having legs give way. Pain has been consistent prior/ after surgery, but no longer shooting pain. He also noted numbness in bilat LE's into  feet. He states that numbness is new. My doctor says  I cannot do anymore damage at this point, He also said not to twist, I don't twist anymore as it flares everything up. I have tried walking on the beach and on uneven surfaces and it continues to cause a lot of pain and difficulty.   PERTINENT HISTORY:  Cardiac, Acid reflux, Anemia, Arthritis, ADD, BPPV, CAD, DM, Dyspnea on exertion, Osteoarthritis.  R shoulder arthroscopy with subacromial decompression and biceps tenotomy 05/21---- L knee arthroscopy  ---    PAIN:  Are you having pain? Yes: NPRS scale: 7/10 Pain location: center of back dull ache and into both hips Pain description: Achy pain, sharp pain Aggravating factors: twisting, walking on uneven surfaces.  Relieving factors: Resting.   PRECAUTIONS: None  RED FLAGS: None   WEIGHT BEARING RESTRICTIONS: No  FALLS:  Has patient fallen in last 6 months? No  LIVING ENVIRONMENT: Lives with: lives with their family Lives in: House/apartment Stairs: 24 stairs internally, 3 externally.  Has following equipment at home: None  OCCUPATION: Retired   PLOF: Independent  PATIENT GOALS: Pt would like to get back to walking.   NEXT MD VISIT: None currently.   OBJECTIVE:  Note: Objective measures were completed at Evaluation unless otherwise noted.  DIAGNOSTIC FINDINGS:  1. Status post L4-S1 discectomy and fusion without evidence of complication. The central canal and foramina appear widely patent at the surgical levels. 2. Mild to moderate central canal narrowing at L2-3 and L3-4. 3. Aortic atherosclerosis.   Aortic Atherosclerosis (ICD10-I70.0).  PATIENT SURVEYS:  Oswestry: 21/50 04/03/24: 18/50  COGNITION: Overall cognitive status: Within functional limits for tasks assessed     SENSATION: WFL   POSTURE: rounded shoulders and forward head  PALPATION: Tenderness across lower back.   LUMBAR ROM:   AROM eval 04/03/24 04/25/24  Flexion 15%* hip flexion  mostly.     Extension 10%  No change no pain   Right lateral flexion Pt denies movement due to pain 90% limited pain 75% limited pain  Left lateral flexion Pt denies movement due to pain 75% limited no pain 75% limited pain  Right rotation Pt denies movement due to pain Full no pain   Left rotation Pt denies movement due to pain Full no pain    (Blank rows = not tested)  LOWER EXTREMITY ROM:     Active  Right eval Left eval R / L 04/03/24  Hip flexion WFL with reports of pain in lower back  [facility] with reports of pain in lower back  Full no pain  Hip extension     Hip abduction     Hip adduction     Hip internal rotation     Hip external rotation [facility]   Knee flexion [facility]  [facility]    Knee extension WFL with  reports of pain in lower back [facility] with reports of pain in lower back No pain   (Blank rows = not tested)  LOWER EXTREMITY MMT:    MMT Right eval Left eval R / L 12/23  Hip flexion     Hip extension     Hip abduction     Hip adduction     Hip internal rotation     Hip external rotation 4 4 5- cramp right hip flex  Knee flexion     Knee extension      (Blank rows = not tested)   FUNCTIONAL TESTS:  5 times sit to stand: 26.22 seconds.  04/03/24: 5x STS -18.58  04/19/24:  R SLS 5 sec, L SLS 3 sec 04/25/24: R SLS 6 sec, L SLS 4s  GAIT: Distance walked: 41ft   Assistive device utilized: None Level of assistance: Complete Independence Comments: Pt walks with a slow antalgic gait. Minimal rotation.  04/03/24: improved cadence, offloading left/antalgic  TREATMENT DATE:  12/29  Review HEP  Supine bridge with TrA S/L clams with TrA with band or supine with band Supine SLR with TrA Supine alt UE/LE with TrA Rows with TrA Shoulder extension with TrA LAQ Possible tandem stance     [facility] Adult PT Treatment:                                             Date: 04/25/24  Testing for PN/re-cert: berg; 69d STS; MMT; ROM  Pt seen for aquatic therapy today.  Treatment  took place in water 3.5-4.75 ft in depth at the [facility] pool. Temp of water was 91.  Pt entered/exited the pool via stairs independently with bil rail.  - unsupported walking forward/ backward with arm swing - side step into wide squat with arm add/abdct with yellow hand floats - suitcase carry with bil yellow under water walking forward backward - wood chop motion with UE (no floats) x 10 each direction - mod cues for form - wall push up/off 2x 10 - gentle trunk rotation - decompression position with yellow noodle under arms behind back and noodle under knees    PATIENT EDUCATION:  Education details: Educated pt on anatomy and physiology of current symptoms, intro to aquatic therapy Person educated: Patient Education method: Programmer, Multimedia, Demonstration Education comprehension: verbalized understanding and returned demonstration  HOME EXERCISE PROGRAM: Access Code: [email or web address] URL: [email or web address] Date: 02/21/2024 Prepared by: Rojean Batten  Exercises - Supine Lower Trunk Rotation  - 1-2 x daily - 7 x weekly - 2-3 sets - 10 reps - Supine Hip Adduction Isometric with Ball  - 1-2 x daily - 7 x weekly - 2-3 sets - 10 reps   ASSESSMENT:  CLINICAL IMPRESSION:  PN: Pt seen and all testing complete for PN and re-cert. As testing above demonstrates he has had an improvement in his strength and Rom of hip and lumbosacral area. He reports good response to aquatic intervention with no pain while submerged and ability to stretch well in setting but it does com back immediately upon exiting. He reports his pain is not limiting as he is able to do everything he wants at home and out. Pain is just nagging. He demonstrates decreased guarding with activity but continues with some level of fear avoidance.  He will continue to benefit from skilled PT  with full transition onto land based intervention  for optimal strengthening.  Final aquatic HEP to be instructed next session  (not scheduled yet)    OBJECTIVE IMPAIRMENTS: decreased activity tolerance, difficulty walking, decreased balance, decreased endurance, decreased mobility, decreased ROM, decreased strength, impaired flexibility, impaired UE/LE use, postural dysfunction, and pain.  ACTIVITY LIMITATIONS: bending, lifting, carry, locomotion, cleaning, community activity, driving, and or occupation  PERSONAL FACTORS: Cardiac, Acid reflux, Anemia, Arthritis, ADD, BPPV, CAD, DM, Dyspnea on exertion, Osteoarthritis.  are also affecting patient's functional outcome.  REHAB POTENTIAL: Good  CLINICAL DECISION MAKING: Evolving/moderate complexity  EVALUATION COMPLEXITY: Moderate    GOALS: Short term PT Goals Target date: 03/06/24 Pt will be I and compliant with HEP. Baseline:  Goal status: Met 03/24/24 Pt will decrease pain by 25% overall Baseline: Goal status: Met 03/24/24  Long term PT goals Target date: 06/23/24  Pt will improve ROM to [facility] to improve functional mobility Baseline: Goal status: Improving 04/03/24; in progress 04/25/24 Pt will improve hip/knee strength to at least 5-/5 MMT to improve functional strength Baseline: Goal status: In Progress 04/25/24 Pt will improve OSWESTRY to at least 13% functional to show improved function Baseline:21/50=42%; 16/50=32% Goal status: In Progress 04/25/24 Pt will reduce pain by overall 50% overall with usual activity Baseline: Goal status: MET 04/03/24 Pt will reduce pain to overall less than 3-4/10 with usual activity and work activity. Baseline: Goal status: In progress 04/03/24/ 04/25/24 Pt will be able to ambulate on uneven surfaces at least 500 ft WNL gait pattern without complaints Baseline: Goal status: In progress 04/03/24; Met 04/25/24   7. Pt will be able to perform SLS for 30 seconds on each leg without LOB.  Baseline: 3 sec Lt, 5 sec Rt  Goal status: In progress - 04/19/24; 04/25/24    8.pt will be indep with body mechanics for  lifting  Baseline: apprehensive to try  Goal Status: NEW  PLAN: PT FREQUENCY: 1-3 times per week   PT DURATION: 6-8 weeks  PLANNED INTERVENTIONS (unless contraindicated): aquatic PT, Canalith repositioning, cryotherapy, Electrical stimulation, Iontophoresis with 4 mg/ml dexamethasome, Moist heat, traction, Ultrasound, gait training, Therapeutic exercise, balance training, neuromuscular re-education, patient/family education, prosthetic training, manual techniques, passive ROM, dry needling, taping, vasopnuematic device, vestibular, spinal manipulations, joint manipulations  PLAN FOR NEXT SESSION: Assess HEP/update PRN, continue to progress functional mobility, work on balance, work on designer, jewellery.  Core control.   [address] [place]) Ziemba MPT 04/30/2024 6:24 PM [facility] [address] [place], [place], [address] Phone: [phone]   Fax:  [phone]   "

## 2024-05-01 ENCOUNTER — Ambulatory Visit (HOSPITAL_BASED_OUTPATIENT_CLINIC_OR_DEPARTMENT_OTHER): Payer: Self-pay | Admitting: Physical Therapy

## 2024-05-01 ENCOUNTER — Other Ambulatory Visit (HOSPITAL_BASED_OUTPATIENT_CLINIC_OR_DEPARTMENT_OTHER): Payer: Self-pay

## 2024-05-01 ENCOUNTER — Encounter (HOSPITAL_BASED_OUTPATIENT_CLINIC_OR_DEPARTMENT_OTHER): Payer: Self-pay | Admitting: Physical Therapy

## 2024-05-01 DIAGNOSIS — M6281 Muscle weakness (generalized): Secondary | ICD-10-CM | POA: Diagnosis not present

## 2024-05-01 DIAGNOSIS — R29898 Other symptoms and signs involving the musculoskeletal system: Secondary | ICD-10-CM

## 2024-05-01 DIAGNOSIS — G8929 Other chronic pain: Secondary | ICD-10-CM

## 2024-05-02 ENCOUNTER — Encounter (HOSPITAL_BASED_OUTPATIENT_CLINIC_OR_DEPARTMENT_OTHER): Payer: Self-pay | Admitting: Physical Therapy

## 2024-05-03 NOTE — Telephone Encounter (Signed)
 Sent pt message letting him know he should have refill at the pharmacy.

## 2024-05-09 ENCOUNTER — Ambulatory Visit (INDEPENDENT_AMBULATORY_CARE_PROVIDER_SITE_OTHER)

## 2024-05-09 ENCOUNTER — Telehealth: Payer: Self-pay | Admitting: Family Medicine

## 2024-05-09 ENCOUNTER — Other Ambulatory Visit: Payer: Self-pay

## 2024-05-09 ENCOUNTER — Other Ambulatory Visit (HOSPITAL_BASED_OUTPATIENT_CLINIC_OR_DEPARTMENT_OTHER): Payer: Self-pay

## 2024-05-09 ENCOUNTER — Ambulatory Visit (HOSPITAL_BASED_OUTPATIENT_CLINIC_OR_DEPARTMENT_OTHER): Payer: Self-pay | Attending: Neurological Surgery | Admitting: Physical Therapy

## 2024-05-09 ENCOUNTER — Encounter (HOSPITAL_BASED_OUTPATIENT_CLINIC_OR_DEPARTMENT_OTHER): Payer: Self-pay | Admitting: Physical Therapy

## 2024-05-09 DIAGNOSIS — M6281 Muscle weakness (generalized): Secondary | ICD-10-CM | POA: Diagnosis present

## 2024-05-09 DIAGNOSIS — G8929 Other chronic pain: Secondary | ICD-10-CM | POA: Insufficient documentation

## 2024-05-09 DIAGNOSIS — E538 Deficiency of other specified B group vitamins: Secondary | ICD-10-CM

## 2024-05-09 DIAGNOSIS — R29898 Other symptoms and signs involving the musculoskeletal system: Secondary | ICD-10-CM | POA: Diagnosis present

## 2024-05-09 DIAGNOSIS — M5441 Lumbago with sciatica, right side: Secondary | ICD-10-CM | POA: Diagnosis present

## 2024-05-09 MED ORDER — CYANOCOBALAMIN 1000 MCG/ML IJ SOLN
1000.0000 ug | Freq: Once | INTRAMUSCULAR | Status: AC
  Administered 2024-05-09: 1000 ug via INTRAMUSCULAR

## 2024-05-09 NOTE — Progress Notes (Signed)
 Pt here for Bi-weekly B12 injection per original order dated:  03/28/24, Dr. Frann Let's have him go back on shots every 2 weeks and recheck his B12 in 2 months.   Last B12 injection: 04/25/2024  Last B12 level: 03/28/2024   B12 1000mcg given IM, right deltoid and pt tolerated injection well.  Next B12 injection scheduled for: 05/23/2024

## 2024-05-09 NOTE — Therapy (Signed)
 " OUTPATIENT PHYSICAL THERAPY THORACOLUMBAR TREATMENT  Patient Name: Reginald Rios MRN: [id number] DOB:Jun 24, 1955, 69 y.o., male Today's Date: 05/10/2024  END OF SESSION:     PT End of Session - 05/09/24 1148     Visit Number 16    Date for Recertification  06/23/24    Authorization Type [employer]    PT Start Time 1146    PT Stop Time 1234    PT Time Calculation (min) 48 min    Activity Tolerance Patient tolerated treatment well    Behavior During Therapy [facility] for tasks assessed/performed               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   BPPV (benign paroxysmal positional vertigo) 08/14/2014   CAD (coronary artery disease)    2/19 PCI/DESx1 to Lcx   Cervical disc disorder with radiculopathy of cervical region 10/07/2015   Cervical radicular pain 01/10/2019   Chronic cough 04/13/2019   Decreased cardiac ejection fraction 04/02/2015   Diabetes mellitus without complication (HCC)    Dyspnea on exertion 05/15/2015   Erectile dysfunction 07/11/2019   Exercise-induced asthma    Family history of early CAD 07/05/2017   Fatigue 05/15/2015   Hallux rigidus, right foot 09/15/2021   Heart murmur    In early childhood. No issues now   Hemarthrosis of right elbow 02/03/2016   Hoarseness 04/13/2019   Hyperlipidemia    family hx of high cholesterol   Hypertension    Hypogonadism in male    Incomplete rotator cuff tear 09/10/2015   Injected 09/10/2015   Lateral epicondylitis of right elbow 07/28/2019   Low back pain at multiple sites 02/05/2021   Low vitamin D  level  04/22/2017   Mild persistent asthma/cough variant asthma 04/13/2019   Myofascial pain syndrome, cervical 07/28/2019   Neck pain 06/01/2018   Nonischemic cardiomyopathy (HCC) 05/15/2015   Nontraumatic incomplete tear of right rotator cuff 07/25/2019   Occipital headache 06/01/2018   Onychomycosis 05/25/2018   Osteoarthritis    Pain in right ankle and joints of right foot 10/05/2016   Palpitations 07/06/2017   Pernicious anemia    PONV (postoperative nausea and vomiting)    Preop cardiovascular exam 10/18/2023   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   Shoulder bursitis 10/12/2014   Injected in 10/12/2014  Repeat 01/21/2015   Small thenar eminence 02/03/2016   Spinal stenosis, lumbar region, without neurogenic claudication 10/12/2014   Spondylolisthesis at L5-S1 level 05/09/2021   Strain of latissimus dorsi muscle 01/21/2015   Tendinopathy of right biceps tendon 07/25/2019   Type 2 diabetes mellitus with hyperglycemia, without long-term current use of insulin  (HCC) 12/22/2013   Ulnar neuropathy at elbow of right upper extremity 01/03/2019   Past Surgical History:  Procedure Laterality Date   CATARACT EXTRACTION W/ INTRAOCULAR LENS IMPLANT Bilateral 2023   COLONOSCOPY     CORONARY PRESSURE/FFR STUDY N/A 06/23/2017   Procedure: INTRAVASCULAR PRESSURE WIRE/FFR STUDY;  Surgeon: Anner Alm ORN, MD;  Location: [facility];  Service: Cardiovascular;  Laterality: N/A;   CORONARY STENT INTERVENTION N/A 06/23/2017   Procedure: CORONARY STENT INTERVENTION;  Surgeon: Anner,  Alm ORN, MD;  Location: [facility];  Service: Cardiovascular;  Laterality: N/A;   KNEE ARTHROSCOPY Left    LEFT HEART CATH AND CORONARY ANGIOGRAPHY N/A 06/23/2017   Procedure: LEFT HEART CATH AND CORONARY ANGIOGRAPHY;  Surgeon: Anner Alm ORN, MD;  Location: [facility] INV[facility]IVE CV [facility];  Service: Cardiovascular;  Laterality: N/A;   LEFT HEART CATH AND CORONARY ANGIOGRAPHY N/A 07/14/2023   Procedure: LEFT HEART CATH AND  CORONARY ANGIOGRAPHY;  Surgeon: Elmira Newman PARAS, MD;  Location: [facility];  Service: Cardiovascular;  Laterality: N/A;   SHOULDER ARTHROSCOPY WITH BICEPSTENOTOMY Right 09/06/2019   Procedure: SHOULDER ARTHROSCOPY WITH BICEPSTENOTOMY;  Surgeon: Jerri Kay HERO, MD;  Location: [facility];  Service: Orthopedics;  Laterality: Right;   SHOULDER ARTHROSCOPY WITH SUBACROMIAL DECOMPRESSION Right 09/06/2019   Procedure: RIGHT SHOULDER ARTHROSCOPY WITH EXTENSIVE DEBRIDEMENT, SUBACROMIAL DECOMPRESSION, BICEPS TENOTOMY;  Surgeon: Jerri Kay HERO, MD;  Location: [facility];  Service: Orthopedics;  Laterality: Right;   TENOTOMY ACHILLES TENDON  2007   In [place], [place]   ULNAR NERVE TRANSPOSITION  2006   In [place], [place]   Patient Active Problem List   Diagnosis Date Noted   Injection site reaction 03/14/2024   Hypertension    PONV (postoperative nausea and vomiting)    Preop cardiovascular exam 10/18/2023   Pernicious anemia    Hallux rigidus, right foot 09/15/2021   Spondylolisthesis at L5-S1 level 05/09/2021   Low back pain at multiple sites 02/05/2021   Osteoarthritis    Heart murmur    Exercise-induced asthma    Diabetes mellitus without complication (HCC)    Arthritis    Anemia    Myofascial pain syndrome, cervical 07/28/2019   Lateral epicondylitis of right elbow 07/28/2019   Nontraumatic incomplete tear of right rotator cuff 07/25/2019   Tendinopathy of right biceps tendon 07/25/2019   Erectile dysfunction 07/11/2019   Mild persistent asthma/cough variant asthma 04/13/2019   Allergic rhinitis with a possible nonallergic component 04/13/2019   Acid reflux 04/13/2019   Chronic cough 04/13/2019   Hoarseness 04/13/2019   Cervical radicular pain 01/10/2019   Ulnar neuropathy at elbow of right upper extremity 01/03/2019   Acute bilateral low back pain without sciatica 01/03/2019   Neck pain 06/01/2018   Occipital headache 06/01/2018    Onychomycosis 05/25/2018   Palpitations 07/06/2017   CAD (coronary artery disease) 07/05/2017   Family history of early CAD 07/05/2017   Abnormal findings on diagnostic imaging of cardiovascular system 06/23/2017   Low vitamin D  level 04/22/2017   Pain in right ankle and joints of right foot 10/05/2016   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   Hemarthrosis of right elbow 02/03/2016   Small thenar eminence 02/03/2016   Cervical disc disorder with radiculopathy of cervical region 10/07/2015   Incomplete rotator cuff tear 09/10/2015   Nonischemic cardiomyopathy (HCC) 05/15/2015   Fatigue 05/15/2015   Dyspnea on exertion 05/15/2015   Decreased cardiac ejection fraction 04/02/2015   Strain of latissimus dorsi muscle 01/21/2015   Shoulder bursitis 10/12/2014   Spinal stenosis, lumbar region, without neurogenic claudication 10/12/2014   BPPV (benign paroxysmal positional vertigo) 08/14/2014   Hyperlipidemia 01/19/2014   Anemia, unspecified 01/19/2014   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   Type 2 diabetes mellitus with hyperglycemia, without long-term current use of insulin  (HCC) 12/22/2013   Hypogonadism in male 12/22/2013   Arthritis of hand, left 12/22/2013    PCP: Frann Mabel Mt, DO  REFERRING PROVIDER: Colon Shove, MD  REFERRING  DIAG: Spondylolisthesis, lumbosacral region [M43.17]   Rationale for Evaluation and Treatment: Rehabilitation  THERAPY DIAG:  Chronic bilateral low back pain with right-sided sciatica  Muscle weakness (generalized)  Other symptoms and signs involving the musculoskeletal system  ONSET DATE: 10/25/23 surgery date.  SUBJECTIVE:                                                                                                                                                                                           SUBJECTIVE STATEMENT: Pt denies any adverse effects after prior treatment and states he felt good when he  left here.  Pt reports having much increased after performing the supine clamshell and bridge exercise at home.  He thinks the bridge caused the pain.  He also had bilat LE pain which he was not having before.  He used the TENS unit which improved his pain.  He didn't perform his other home exercises.  Pt mowed the lawn and felt pretty good.  Pt states motion including walking helps.  He has much difficulty with stairs.  Pt has been painting.           PERTINENT HISTORY:  Cardiac, Acid reflux, Anemia, Arthritis, ADD, BPPV, CAD, DM, Dyspnea on exertion, Osteoarthritis.  R shoulder arthroscopy with subacromial decompression and biceps tenotomy 05/21 L knee arthroscopy for meniscus tear R achilles repair in 2007   PAIN:  Are you having pain? Yes: NPRS scale: 7/10 currently Pain location: central lumbar to bilat sides of lower back down bilat LE's to post knees Pain description: Achy pain, sharp pain Aggravating factors: twisting, walking on uneven surfaces.  Relieving factors: Resting.   PRECAUTIONS: None  RED FLAGS: None   WEIGHT BEARING RESTRICTIONS: No  FALLS:  Has patient fallen in last 6 months? No  LIVING ENVIRONMENT: Lives with: lives with their family Lives in: House/apartment Stairs: 24 stairs internally, 3 externally.  Has following equipment at home: None  OCCUPATION: Retired   PLOF: Independent  PATIENT GOALS: Pt would like to get back to walking.   NEXT MD VISIT: None currently.   OBJECTIVE:  Note: Objective measures were completed at Evaluation unless otherwise noted.  DIAGNOSTIC FINDINGS:  1. Status post L4-S1 discectomy and fusion without evidence of complication. The central canal and foramina appear widely patent at the surgical levels. 2. Mild to moderate central canal narrowing at L2-3 and L3-4. 3. Aortic atherosclerosis.   Aortic Atherosclerosis (ICD10-I70.0).  PATIENT SURVEYS:  Oswestry: 21/50 04/03/24: 18/50  COGNITION: Overall cognitive  status: Within functional limits for tasks assessed     SENSATION: WFL   POSTURE: rounded shoulders and forward head  PALPATION:  Tenderness across lower back.   LUMBAR ROM:   AROM eval 04/03/24 04/25/24  Flexion 15%* hip flexion mostly.     Extension 10%  No change no pain   Right lateral flexion Pt denies movement due to pain 90% limited pain 75% limited pain  Left lateral flexion Pt denies movement due to pain 75% limited no pain 75% limited pain  Right rotation Pt denies movement due to pain Full no pain   Left rotation Pt denies movement due to pain Full no pain    (Blank rows = not tested)  LOWER EXTREMITY ROM:     Active  Right eval Left eval R / L 04/03/24  Hip flexion WFL with reports of pain in lower back  [facility] with reports of pain in lower back  Full no pain  Hip extension     Hip abduction     Hip adduction     Hip internal rotation     Hip external rotation [facility]   Knee flexion [facility]  [facility]    Knee extension WFL with reports of pain in lower back [facility] with reports of pain in lower back No pain   (Blank rows = not tested)  LOWER EXTREMITY MMT:    MMT Right eval Left eval R / L 12/23  Hip flexion     Hip extension     Hip abduction     Hip adduction     Hip internal rotation     Hip external rotation 4 4 5- cramp right hip flex  Knee flexion     Knee extension      (Blank rows = not tested)   FUNCTIONAL TESTS:  5 times sit to stand: 26.22 seconds.  04/03/24: 5x STS -18.58  04/19/24:  R SLS 5 sec, L SLS 3 sec 04/25/24: R SLS 6 sec, L SLS 4s  GAIT: Distance walked: 26ft   Assistive device utilized: None Level of assistance: Complete Independence Comments: Pt walks with a slow antalgic gait. Minimal rotation.  04/03/24: improved cadence, offloading left/antalgic  TREATMENT DATE:  1/6 Nustep lvl 3 x 5 mins  PT educated pt in correct performance of TrA and pt performed with cuing Supine marching with TrA x15 reps Supine clams with RTB with TrA  2x10 Supine piriformis stretch 2x20-30 sec  Manual Therapy:  Pt received STM and TPR to R sided lumbar paraspinals and R glute in L S/L'ing with pillow b/w knees  PT answered pt's questions.    12/29  Reviewed pt presentation, pain levels, response to prior treatment, and PMHx. PT educated pt in correct performance of TrA Supine bridge with TrA x 10 reps Supine marching with TrA Supine clams with RTB with TrA x 10 S/L clams   Updated HEP and gave pt a HEP handout.  PT educated pt in correct form and appropriate frequency.  PT instructed pt he should not have pain with HEP.   STM to bilat ITB and vastus lateralis in supine with knees over foam roll     [facility] Adult PT Treatment:                                             Date: 04/25/24  Testing for PN/re-cert: berg; 69d STS; MMT; ROM  Pt seen for aquatic therapy today.  Treatment took place in water 3.5-4.75 ft in depth at the  [facility] pool. Temp of water was 91.  Pt entered/exited the pool via stairs independently with bil rail.  - unsupported walking forward/ backward with arm swing - side step into wide squat with arm add/abdct with yellow hand floats - suitcase carry with bil yellow under water walking forward backward - wood chop motion with UE (no floats) x 10 each direction - mod cues for form - wall push up/off 2x 10 - gentle trunk rotation - decompression position with yellow noodle under arms behind back and noodle under knees    PATIENT EDUCATION:  Education details:  PT answered pt's questions.  Dx, HEP, relevant anatomy, exercise form, POC, and rationale of interventions. Person educated: Patient Education method: Explanation, Demonstration, verbal and tactile cues, handout Education comprehension: verbalized understanding and returned demonstration, verbal and tactile cues required  HOME EXERCISE PROGRAM: Access Code: [email or web address] URL: [email or web address] Date: 02/21/2024 Prepared  by: Rojean Batten  Exercises - Supine Lower Trunk Rotation  - 1-2 x daily - 7 x weekly - 2-3 sets - 10 reps - Supine Hip Adduction Isometric with Ball  - 1-2 x daily - 7 x weekly - 2-3 sets - 10 reps  Updated HEP: - Supine Transversus Abdominis Bracing - Hands on Stomach  - 2 x daily - 7 x weekly - 2 sets - 10 reps - Supine March  - 1 x daily - 7 x weekly - 2 sets - 5 reps - Hooklying Clamshell with Resistance  - 1 x daily - 4-5 x weekly - 2 sets - 10 reps   ASSESSMENT:  CLINICAL IMPRESSION:  Pt reports having increased pain when he performed 2 of his home exercises though felt that the bridge is what caused him more pain.  He has increased lumbar pain and is now having pain in bilat LE's.  PT did not perform bridge today.  PT reviewed and pt performed land based HEP which he tolerated well.  Pt performed exercises well with instruction and cuing for correct form and positioning.  He tolerated exercises well without c/o's.  PT performed soft tissue work to R sided lumbar paraspinals and glute to improve soft tissue tightness and mobility and pain and to reduce myofascial restrictions.  Pt had soft tissue tightness in lumbar paraspinals and glute and tolerated STM and TPR well.  PT answered pt's questions.  He responded well to treatment reporting improved pain to 2/10 on R side and no pain in bilat LE's.  He continued to have 6-7/10 pain  on L side of lumbar.  He should benefit from cont skilled PT to address ongoing goals and impairments and to improve overall function.    OBJECTIVE IMPAIRMENTS: decreased activity tolerance, difficulty walking, decreased balance, decreased endurance, decreased mobility, decreased ROM, decreased strength, impaired flexibility, impaired UE/LE use, postural dysfunction, and pain.  ACTIVITY LIMITATIONS: bending, lifting, carry, locomotion, cleaning, community activity, driving, and or occupation  PERSONAL FACTORS: Cardiac, Acid reflux, Anemia, Arthritis, ADD, BPPV,  CAD, DM, Dyspnea on exertion, Osteoarthritis.  are also affecting patient's functional outcome.  REHAB POTENTIAL: Good  CLINICAL DECISION MAKING: Evolving/moderate complexity  EVALUATION COMPLEXITY: Moderate    GOALS: Short term PT Goals Target date: 03/06/24 Pt will be I and compliant with HEP. Baseline:  Goal status: Met 03/24/24 Pt will decrease pain by 25% overall Baseline: Goal status: Met 03/24/24  Long term PT goals Target date: 06/23/24  Pt will improve ROM to [facility] to improve functional mobility Baseline: Goal status: Improving 04/03/24; in progress 04/25/24 Pt  will improve hip/knee strength to at least 5-/5 MMT to improve functional strength Baseline: Goal status: In Progress 04/25/24 Pt will improve OSWESTRY to at least 13% functional to show improved function Baseline:21/50=42%; 16/50=32% Goal status: In Progress 04/25/24 Pt will reduce pain by overall 50% overall with usual activity Baseline: Goal status: MET 04/03/24 Pt will reduce pain to overall less than 3-4/10 with usual activity and work activity. Baseline: Goal status: In progress 04/03/24/ 04/25/24 Pt will be able to ambulate on uneven surfaces at least 500 ft WNL gait pattern without complaints Baseline: Goal status: In progress 04/03/24; Met 04/25/24   7. Pt will be able to perform SLS for 30 seconds on each leg without LOB.  Baseline: 3 sec Lt, 5 sec Rt  Goal status: In progress - 04/19/24; 04/25/24    8.pt will be indep with body mechanics for lifting  Baseline: apprehensive to try  Goal Status: NEW  PLAN: PT FREQUENCY: 1-3 times per week   PT DURATION: 6-8 weeks  PLANNED INTERVENTIONS (unless contraindicated): aquatic PT, Canalith repositioning, cryotherapy, Electrical stimulation, Iontophoresis with 4 mg/ml dexamethasome, Moist heat, traction, Ultrasound, gait training, Therapeutic exercise, balance training, neuromuscular re-education, patient/family education, prosthetic training, manual  techniques, passive ROM, dry needling, taping, vasopnuematic device, vestibular, spinal manipulations, joint manipulations  PLAN FOR NEXT SESSION: Assess HEP/update PRN, continue to progress functional mobility, work on balance, work on designer, jewellery.  Core control.   Leigh Minerva III PT, DPT 05/10/2024 1:47 PM   [facility] [address] [place], [place], [address] Phone: [phone]   Fax:  [phone]   "

## 2024-05-09 NOTE — Telephone Encounter (Signed)
 Patient lab visit note says B12 check but only has an order for an UA.

## 2024-05-10 ENCOUNTER — Other Ambulatory Visit (HOSPITAL_BASED_OUTPATIENT_CLINIC_OR_DEPARTMENT_OTHER): Payer: Self-pay

## 2024-05-16 ENCOUNTER — Encounter (HOSPITAL_BASED_OUTPATIENT_CLINIC_OR_DEPARTMENT_OTHER): Payer: Self-pay | Admitting: Physical Therapy

## 2024-05-16 ENCOUNTER — Ambulatory Visit (HOSPITAL_BASED_OUTPATIENT_CLINIC_OR_DEPARTMENT_OTHER): Payer: Self-pay | Admitting: Physical Therapy

## 2024-05-16 DIAGNOSIS — G8929 Other chronic pain: Secondary | ICD-10-CM

## 2024-05-16 DIAGNOSIS — R29898 Other symptoms and signs involving the musculoskeletal system: Secondary | ICD-10-CM

## 2024-05-16 DIAGNOSIS — M6281 Muscle weakness (generalized): Secondary | ICD-10-CM

## 2024-05-16 NOTE — Therapy (Signed)
 " OUTPATIENT PHYSICAL THERAPY THORACOLUMBAR TREATMENT  Patient Name: Reginald Rios MRN: [id number] DOB:05-02-1956, 69 y.o., male Today's Date: 05/16/2024  END OF SESSION:     PT End of Session - 05/16/24 1328     Visit Number 17    Date for Recertification  06/23/24    Authorization Type  EMPLOYEE    Progress Note Due on Visit 24    PT Start Time 1316    PT Stop Time 1356    PT Time Calculation (min) 40 min    Behavior During Therapy [facility] for tasks assessed/performed           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   BPPV (benign paroxysmal positional vertigo) 08/14/2014   CAD (coronary artery disease)    2/19 PCI/DESx1 to Lcx   Cervical disc disorder with radiculopathy of cervical region 10/07/2015   Cervical radicular pain 01/10/2019   Chronic cough 04/13/2019   Decreased cardiac ejection fraction 04/02/2015   Diabetes mellitus without complication (HCC)    Dyspnea on exertion 05/15/2015   Erectile dysfunction 07/11/2019   Exercise-induced asthma    Family history of early CAD 07/05/2017   Fatigue 05/15/2015   Hallux rigidus, right foot 09/15/2021   Heart murmur    In early childhood. No issues now   Hemarthrosis of right elbow 02/03/2016   Hoarseness 04/13/2019   Hyperlipidemia    family hx of high cholesterol   Hypertension    Hypogonadism in male    Incomplete rotator cuff tear 09/10/2015   Injected 09/10/2015   Lateral epicondylitis of right elbow 07/28/2019   Low back pain at multiple sites 02/05/2021   Low vitamin D  level 04/22/2017   Mild persistent  asthma/cough variant asthma 04/13/2019   Myofascial pain syndrome, cervical 07/28/2019   Neck pain 06/01/2018   Nonischemic cardiomyopathy (HCC) 05/15/2015   Nontraumatic incomplete tear of right rotator cuff 07/25/2019   Occipital headache 06/01/2018   Onychomycosis 05/25/2018   Osteoarthritis    Pain in right ankle and joints of right foot 10/05/2016   Palpitations 07/06/2017   Pernicious anemia    PONV (postoperative nausea and vomiting)    Preop cardiovascular exam 10/18/2023   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   Shoulder bursitis 10/12/2014   Injected in 10/12/2014  Repeat 01/21/2015   Small thenar eminence 02/03/2016   Spinal stenosis, lumbar region, without neurogenic claudication 10/12/2014   Spondylolisthesis at L5-S1 level 05/09/2021   Strain of latissimus dorsi muscle 01/21/2015   Tendinopathy of right biceps tendon 07/25/2019   Type 2 diabetes mellitus with hyperglycemia, without long-term current use of insulin  (HCC) 12/22/2013   Ulnar neuropathy at elbow of right upper extremity 01/03/2019   Past Surgical History:  Procedure Laterality Date   CATARACT EXTRACTION W/ INTRAOCULAR LENS IMPLANT Bilateral 2023   COLONOSCOPY     CORONARY PRESSURE/FFR STUDY N/A 06/23/2017   Procedure: INTRAVASCULAR PRESSURE WIRE/FFR STUDY;  Surgeon: Anner Alm ORN, MD;  Location: [facility] INVASIVE CV LAB;  Service: Cardiovascular;  Laterality: N/A;   CORONARY STENT INTERVENTION N/A 06/23/2017   Procedure: CORONARY STENT INTERVENTION;  Surgeon: Anner Alm ORN, MD;  Location: [facility] INVASIVE CV LAB;  Service: Cardiovascular;  Laterality: N/A;   KNEE ARTHROSCOPY Left    LEFT HEART CATH AND CORONARY ANGIOGRAPHY N/A 06/23/2017   Procedure: LEFT HEART CATH AND CORONARY ANGIOGRAPHY;  Surgeon: Anner Alm ORN, MD;  Location[facility] INVASIVE CV LAB;  Service: Cardiovascular;  Laterality: N/A;   LEFT HEART CATH AND CORONARY ANGIOGRAPHY N/A 07/14/2023   Procedure: LEFT HEART CATH AND CORONARY ANGIOGRAPHY;  Surgeon:  Elmira Newman PARAS, MD;  Location: [facility] INVASIVE CV LAB;  Service: Cardiovascular;  Laterality: N/A;   SHOULDER ARTHROSCOPY WITH BICEPSTENOTOMY Right 09/06/2019   Procedure: SHOULDER ARTHROSCOPY WITH BICEPSTENOTOMY;  Surgeon: Jerri Kay HERO, MD;  Location: [facility];  Service: Orthopedics;  Laterality: Right;   SHOULDER ARTHROSCOPY WITH SUBACROMIAL DECOMPRESSION Right 09/06/2019   Procedure: RIGHT SHOULDER ARTHROSCOPY WITH EXTENSIVE DEBRIDEMENT, SUBACROMIAL DECOMPRESSION, BICEPS TENOTOMY;  Surgeon: Jerri Kay HERO, MD;  Location: [facility];  Service: Orthopedics;  Laterality: Right;   TENOTOMY ACHILLES TENDON  2007   In [place], [place]   ULNAR NERVE TRANSPOSITION  2006   In [place], [place]   Patient Active Problem List   Diagnosis Date Noted   Injection site reaction 03/14/2024   Hypertension    PONV (postoperative nausea and vomiting)    Preop cardiovascular exam 10/18/2023   Pernicious anemia    Hallux rigidus, right foot 09/15/2021   Spondylolisthesis at L5-S1 level 05/09/2021   Low back pain at multiple sites 02/05/2021   Osteoarthritis    Heart murmur    Exercise-induced asthma    Diabetes mellitus without complication (HCC)    Arthritis    Anemia    Myofascial pain syndrome, cervical 07/28/2019   Lateral epicondylitis of right elbow 07/28/2019   Nontraumatic incomplete tear of right rotator cuff 07/25/2019   Tendinopathy of right biceps tendon 07/25/2019   Erectile dysfunction 07/11/2019   Mild persistent asthma/cough variant asthma 04/13/2019   Allergic rhinitis with a possible nonallergic component 04/13/2019   Acid reflux 04/13/2019   Chronic cough 04/13/2019   Hoarseness 04/13/2019   Cervical radicular pain 01/10/2019   Ulnar neuropathy at elbow of right upper extremity 01/03/2019   Acute bilateral low back pain without sciatica 01/03/2019   Neck pain 06/01/2018   Occipital headache 06/01/2018   Onychomycosis 05/25/2018   Palpitations  07/06/2017   CAD (coronary artery disease) 07/05/2017   Family history of early CAD 07/05/2017   Abnormal findings on diagnostic imaging of cardiovascular system 06/23/2017   Low vitamin D  level 04/22/2017   Pain in right ankle and joints of right foot 10/05/2016   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   Hemarthrosis of right elbow 02/03/2016   Small thenar eminence 02/03/2016   Cervical disc disorder with radiculopathy of cervical region 10/07/2015   Incomplete rotator cuff tear 09/10/2015   Nonischemic cardiomyopathy (HCC) 05/15/2015   Fatigue 05/15/2015   Dyspnea on exertion 05/15/2015   Decreased cardiac ejection fraction 04/02/2015   Strain of latissimus dorsi muscle 01/21/2015   Shoulder bursitis 10/12/2014   Spinal stenosis, lumbar region, without neurogenic claudication 10/12/2014   BPPV (benign paroxysmal positional vertigo) 08/14/2014   Hyperlipidemia 01/19/2014   Anemia, unspecified 01/19/2014   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   Type 2 diabetes mellitus with hyperglycemia, without long-term current use of insulin  (HCC) 12/22/2013   Hypogonadism in male 12/22/2013   Arthritis of hand, left 12/22/2013    PCP: Frann Mabel Mt, DO  REFERRING PROVIDER: Colon Shove, MD  REFERRING DIAG: Spondylolisthesis, lumbosacral region [  M43.17]   Rationale for Evaluation and Treatment: Rehabilitation  THERAPY DIAG:  Chronic bilateral low back pain with right-sided sciatica  Muscle weakness (generalized)  Other symptoms and signs involving the musculoskeletal system  ONSET DATE: 10/25/23 surgery date.  SUBJECTIVE:                                                                                                                                                                                           SUBJECTIVE STATEMENT: I'm achey.   Pt reports compliance with HEP, however some increase in symptoms in back and he feels it's difficult to keep  core engaged.        PERTINENT HISTORY:  Cardiac, Acid reflux, Anemia, Arthritis, ADD, BPPV, CAD, DM, Dyspnea on exertion, Osteoarthritis.  R shoulder arthroscopy with subacromial decompression and biceps tenotomy 05/21 L knee arthroscopy for meniscus tear R achilles repair in 2007   PAIN:  Are you having pain? Yes: NPRS scale: 6-7/10  Pain location: central lumbar to bilat sides of lower back down bilat LE's to post knees Pain description: Achy pain, sharp pain Aggravating factors: twisting, walking on uneven surfaces.  Relieving factors: Resting.   PRECAUTIONS: None  RED FLAGS: None   WEIGHT BEARING RESTRICTIONS: No  FALLS:  Has patient fallen in last 6 months? No  LIVING ENVIRONMENT: Lives with: lives with their family Lives in: House/apartment Stairs: 24 stairs internally, 3 externally.  Has following equipment at home: None  OCCUPATION: Retired   PLOF: Independent  PATIENT GOALS: Pt would like to get back to walking.   NEXT MD VISIT: None currently.   OBJECTIVE:  Note: Objective measures were completed at Evaluation unless otherwise noted.  DIAGNOSTIC FINDINGS:  1. Status post L4-S1 discectomy and fusion without evidence of complication. The central canal and foramina appear widely patent at the surgical levels. 2. Mild to moderate central canal narrowing at L2-3 and L3-4. 3. Aortic atherosclerosis.   Aortic Atherosclerosis (ICD10-I70.0).  PATIENT SURVEYS:  Oswestry: 21/50 04/03/24: 18/50  COGNITION: Overall cognitive status: Within functional limits for tasks assessed     SENSATION: WFL   POSTURE: rounded shoulders and forward head  PALPATION: Tenderness across lower back.   LUMBAR ROM:   AROM eval 04/03/24 04/25/24  Flexion 15%* hip flexion mostly.     Extension 10%  No change no pain   Right lateral flexion Pt denies movement due to pain 90% limited pain 75% limited pain  Left lateral flexion Pt denies movement due to pain 75% limited  no pain 75% limited pain  Right rotation Pt denies movement due to pain Full no pain   Left rotation Pt  denies movement due to pain Full no pain    (Blank rows = not tested)  LOWER EXTREMITY ROM:     Active  Right eval Left eval R / L 04/03/24  Hip flexion WFL with reports of pain in lower back  [facility] with reports of pain in lower back  Full no pain  Hip extension     Hip abduction     Hip adduction     Hip internal rotation     Hip external rotation [facility]   Knee flexion [facility]  [facility]    Knee extension WFL with reports of pain in lower back [facility] with reports of pain in lower back No pain   (Blank rows = not tested)  LOWER EXTREMITY MMT:    MMT Right eval Left eval R / L 12/23  Hip flexion     Hip extension     Hip abduction     Hip adduction     Hip internal rotation     Hip external rotation 4 4 5- cramp right hip flex  Knee flexion     Knee extension      (Blank rows = not tested)   FUNCTIONAL TESTS:  5 times sit to stand: 26.22 seconds.  04/03/24: 5x STS -18.58  04/19/24:  R SLS 5 sec, L SLS 3 sec 04/25/24: R SLS 6 sec, L SLS 4s  GAIT: Distance walked: 13ft   Assistive device utilized: None Level of assistance: Complete Independence Comments: Pt walks with a slow antalgic gait. Minimal rotation.  04/03/24: improved cadence, offloading left/antalgic  TREATMENT DATE:  05/17/23 -NuStep, UE/LE Level 5, x 8 minute, therapist present to discuss progress - STS from NuSTep seat x 10 with 4 sec lowering, cues for TrA set, forward arm reach, neutral head - half kneeling with wood chop holding ball x 8 each side (SBA for safety) - Supine marching with TrA set 2x10 (cues for form, to retain neutral lumbar curve) - Supine LTR to tolerance - Hook lying piriformis stretch pulling knee to opposite shoulder  - L stretch at sink x 15sec x 2  1/6 Nustep lvl 3 x 5 mins  PT educated pt in correct performance of TrA and pt performed with cuing Supine marching with TrA x15  reps Supine clams with RTB with TrA 2x10 Supine piriformis stretch 2x20-30 sec  Manual Therapy:  Pt received STM and TPR to R sided lumbar paraspinals and R glute in L S/L'ing with pillow b/w knees  PT answered pt's questions.    12/29  Reviewed pt presentation, pain levels, response to prior treatment, and PMHx. PT educated pt in correct performance of TrA Supine bridge with TrA x 10 reps Supine marching with TrA Supine clams with RTB with TrA x 10 S/L clams   Updated HEP and gave pt a HEP handout.  PT educated pt in correct form and appropriate frequency.  PT instructed pt he should not have pain with HEP.   STM to bilat ITB and vastus lateralis in supine with knees over foam roll     [facility] Adult PT Treatment:                                             Date: 04/25/24  Testing for PN/re-cert: berg; 69d STS; MMT; ROM  Pt seen for aquatic therapy today.  Treatment took place in  water 3.5-4.75 ft in depth at the [facility] pool. Temp of water was 91.  Pt entered/exited the pool via stairs independently with bil rail.  - unsupported walking forward/ backward with arm swing - side step into wide squat with arm add/abdct with yellow hand floats - suitcase carry with bil yellow under water walking forward backward - wood chop motion with UE (no floats) x 10 each direction - mod cues for form - wall push up/off 2x 10 - gentle trunk rotation - decompression position with yellow noodle under arms behind back and noodle under knees    PATIENT EDUCATION:  Education details:  PT answered pt's questions.  Dx, HEP, relevant anatomy, exercise form, POC, and rationale of interventions. Person educated: Patient Education method: Explanation, Demonstration, verbal and tactile cues, handout Education comprehension: verbalized understanding and returned demonstration, verbal and tactile cues required  HOME EXERCISE PROGRAM: Access Code: [email or web address] URL:  [email or web address]     ASSESSMENT:  CLINICAL IMPRESSION:  Pt reports increased pain in lower back when he performed HEP.  He did report increase in LBP on NuStep after 4 min; reduced with rolled towel in lumbar and scooting seat back.  Pt required minor instruction and cuing for correct form and positioning of exercises performed today.  He tolerated exercises well with report of reduction of pain to 2/10 by end of session.  Pt will benefit from cont skilled PT to address ongoing goals and impairments and to improve overall function.  HEP updated today and reissued.  Encouraged pt to complete aquatic HEP at pool and updated land HEP until next scheduled visit; pt verbalized understanding.     OBJECTIVE IMPAIRMENTS: decreased activity tolerance, difficulty walking, decreased balance, decreased endurance, decreased mobility, decreased ROM, decreased strength, impaired flexibility, impaired UE/LE use, postural dysfunction, and pain.  ACTIVITY LIMITATIONS: bending, lifting, carry, locomotion, cleaning, community activity, driving, and or occupation  PERSONAL FACTORS: Cardiac, Acid reflux, Anemia, Arthritis, ADD, BPPV, CAD, DM, Dyspnea on exertion, Osteoarthritis.  are also affecting patient's functional outcome.  REHAB POTENTIAL: Good  CLINICAL DECISION MAKING: Evolving/moderate complexity  EVALUATION COMPLEXITY: Moderate    GOALS: Short term PT Goals Target date: 03/06/24 Pt will be I and compliant with HEP. Baseline:  Goal status: Met 03/24/24 Pt will decrease pain by 25% overall Baseline: Goal status: Met 03/24/24  Long term PT goals Target date: 06/23/24  Pt will improve ROM to [facility] to improve functional mobility Baseline: Goal status: Improving 04/03/24; in progress 04/25/24 Pt will improve hip/knee strength to at least 5-/5 MMT to improve functional strength Baseline: Goal status: In Progress 04/25/24 Pt will improve OSWESTRY to at least 13% functional to  show improved function Baseline:21/50=42%; 16/50=32% Goal status: In Progress 04/25/24 Pt will reduce pain by overall 50% overall with usual activity Baseline: Goal status: MET 04/03/24 Pt will reduce pain to overall less than 3-4/10 with usual activity and work activity. Baseline: Goal status: In progress  04/25/24 Pt will be able to ambulate on uneven surfaces at least 500 ft WNL gait pattern without complaints Baseline: Goal status: In progress 04/03/24; Met 04/25/24   7. Pt will be able to perform SLS for 30 seconds on each leg without LOB.  Baseline: 3 sec Lt, 5 sec Rt  Goal status: In progress - 04/25/24    8.pt will be indep with body mechanics for lifting  Baseline: apprehensive to try  Goal Status: NEW  PLAN: PT FREQUENCY: 1-3 times per week   PT DURATION: 6-8 weeks  PLANNED INTERVENTIONS (  unless contraindicated): aquatic PT, Canalith repositioning, cryotherapy, Electrical stimulation, Iontophoresis with 4 mg/ml dexamethasome, Moist heat, traction, Ultrasound, gait training, Therapeutic exercise, balance training, neuromuscular re-education, patient/family education, prosthetic training, manual techniques, passive ROM, dry needling, taping, vasopnuematic device, vestibular, spinal manipulations, joint manipulations  PLAN FOR NEXT SESSION: Assess HEP/update PRN, continue to progress functional mobility, work on balance, work on designer, jewellery.  Core control.   Delon Aquas, PTA 05/16/2024 2:04 PM [facility] [address] [place], [place], [address] Phone: [phone]   Fax:  [phone]  "

## 2024-05-23 ENCOUNTER — Other Ambulatory Visit: Payer: Self-pay | Admitting: Family Medicine

## 2024-05-23 ENCOUNTER — Ambulatory Visit (INDEPENDENT_AMBULATORY_CARE_PROVIDER_SITE_OTHER)

## 2024-05-23 ENCOUNTER — Ambulatory Visit: Payer: Self-pay | Admitting: Family Medicine

## 2024-05-23 ENCOUNTER — Other Ambulatory Visit (INDEPENDENT_AMBULATORY_CARE_PROVIDER_SITE_OTHER)

## 2024-05-23 ENCOUNTER — Other Ambulatory Visit (HOSPITAL_BASED_OUTPATIENT_CLINIC_OR_DEPARTMENT_OTHER): Payer: Self-pay

## 2024-05-23 DIAGNOSIS — E538 Deficiency of other specified B group vitamins: Secondary | ICD-10-CM

## 2024-05-23 LAB — VITAMIN B12: Vitamin B-12: 273 pg/mL (ref 211–911)

## 2024-05-23 MED ORDER — CYANOCOBALAMIN 1000 MCG/ML IJ SOLN
1000.0000 ug | Freq: Once | INTRAMUSCULAR | Status: AC
  Administered 2024-05-23: 1000 ug via INTRAMUSCULAR

## 2024-05-23 MED ORDER — FLUTICASONE PROPIONATE 50 MCG/ACT NA SUSP
2.0000 | Freq: Every day | NASAL | 6 refills | Status: AC
  Filled 2024-05-23: qty 16, 30d supply, fill #0

## 2024-05-23 NOTE — Progress Notes (Signed)
 Pt here for bi-weekly B12 injection per original order dated:  03/28/24, Dr. Frann Let's have him go back on shots every 2 weeks and recheck his B12 in 2 months.  Last B12 injection: 05/09/2024  Last B12 level:  05/23/2024  B12 1000mcg given IM, left deltoid and pt tolerated injection well.  Next B12 injection scheduled for: 06/07/2024, waiting on pending B12 results

## 2024-05-24 ENCOUNTER — Ambulatory Visit (HOSPITAL_BASED_OUTPATIENT_CLINIC_OR_DEPARTMENT_OTHER): Payer: Self-pay | Admitting: Physical Therapy

## 2024-05-24 ENCOUNTER — Encounter (HOSPITAL_BASED_OUTPATIENT_CLINIC_OR_DEPARTMENT_OTHER): Payer: Self-pay | Admitting: Physical Therapy

## 2024-05-24 DIAGNOSIS — G8929 Other chronic pain: Secondary | ICD-10-CM | POA: Diagnosis not present

## 2024-05-24 DIAGNOSIS — M6281 Muscle weakness (generalized): Secondary | ICD-10-CM

## 2024-05-24 DIAGNOSIS — R29898 Other symptoms and signs involving the musculoskeletal system: Secondary | ICD-10-CM

## 2024-05-24 NOTE — Therapy (Signed)
 " OUTPATIENT PHYSICAL THERAPY THORACOLUMBAR TREATMENT  Patient Name: Reginald Rios MRN: [id number] DOB:04/25/1956, 69 y.o., male Today's Date: 05/24/2024  END OF SESSION:     PT End of Session - 05/24/24 1652     Visit Number 18    Date for Recertification  06/23/24    Authorization Type [employer]    Progress Note Due on Visit 24    PT Start Time 1648    PT Stop Time 1730    PT Time Calculation (min) 42 min    Behavior During Therapy [facility] for tasks assessed/performed           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   BPPV (benign paroxysmal positional vertigo) 08/14/2014   CAD (coronary artery disease)    2/19 PCI/DESx1 to Lcx   Cervical disc disorder with radiculopathy of cervical region 10/07/2015   Cervical radicular pain 01/10/2019   Chronic cough 04/13/2019   Decreased cardiac ejection fraction 04/02/2015   Diabetes mellitus without complication (HCC)    Dyspnea on exertion 05/15/2015   Erectile dysfunction 07/11/2019   Exercise-induced asthma    Family history of early CAD 07/05/2017   Fatigue 05/15/2015   Hallux rigidus, right foot 09/15/2021   Heart murmur    In early childhood. No issues now   Hemarthrosis of right elbow 02/03/2016   Hoarseness 04/13/2019   Hyperlipidemia    family hx of high cholesterol   Hypertension    Hypogonadism in male    Incomplete rotator cuff tear 09/10/2015   Injected 09/10/2015   Lateral epicondylitis of right elbow 07/28/2019   Low back pain at multiple sites 02/05/2021   Low vitamin D  level 04/22/2017   Mild persistent  asthma/cough variant asthma 04/13/2019   Myofascial pain syndrome, cervical 07/28/2019   Neck pain 06/01/2018   Nonischemic cardiomyopathy (HCC) 05/15/2015   Nontraumatic incomplete tear of right rotator cuff 07/25/2019   Occipital headache 06/01/2018   Onychomycosis 05/25/2018   Osteoarthritis    Pain in right ankle and joints of right foot 10/05/2016   Palpitations 07/06/2017   Pernicious anemia    PONV (postoperative nausea and vomiting)    Preop cardiovascular exam 10/18/2023   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   Shoulder bursitis 10/12/2014   Injected in 10/12/2014  Repeat 01/21/2015   Small thenar eminence 02/03/2016   Spinal stenosis, lumbar region, without neurogenic claudication 10/12/2014   Spondylolisthesis at L5-S1 level 05/09/2021   Strain of latissimus dorsi muscle 01/21/2015   Tendinopathy of right biceps tendon 07/25/2019   Type 2 diabetes mellitus with hyperglycemia, without long-term current use of insulin  (HCC) 12/22/2013   Ulnar neuropathy at elbow of right upper extremity 01/03/2019   Past Surgical History:  Procedure Laterality Date   CATARACT EXTRACTION W/ INTRAOCULAR LENS IMPLANT Bilateral 2023   COLONOSCOPY     CORONARY PRESSURE/FFR STUDY N/A 06/23/2017   Procedure: INTRAVASCULAR PRESSURE WIRE/FFR STUDY;  Surgeon: Anner Alm ORN, MD;  Location: [facility];  Service: Cardiovascular;  Laterality: N/A;   CORONARY STENT INTERVENTION N/A 06/23/2017   Procedure: CORONARY STENT INTERVENTION;  Surgeon: Anner Alm ORN, MD;  Location: [facility];  Service: Cardiovascular;  Laterality: N/A;   KNEE ARTHROSCOPY Left    LEFT HEART CATH AND CORONARY ANGIOGRAPHY N/A 06/23/2017   Procedure: LEFT HEART CATH AND CORONARY ANGIOGRAPHY;  Surgeon: Anner Alm ORN, MD;  Location: [facility] INVASIVE CV LAB;  Service: Cardiovascular;  Laterality: N/A;   LEFT HEART CATH AND CORONARY ANGIOGRAPHY N/A 07/14/2023   Procedure: LEFT HEART CATH AND CORONARY ANGIOGRAPHY;  Surgeon:  Elmira Newman PARAS, MD;  Location: [facility];  Service: Cardiovascular;  Laterality: N/A;   SHOULDER ARTHROSCOPY WITH BICEPSTENOTOMY Right 09/06/2019   Procedure: SHOULDER ARTHROSCOPY WITH BICEPSTENOTOMY;  Surgeon: Jerri Kay HERO, MD;  Location: [facility];  Service: Orthopedics;  Laterality: Right;   SHOULDER ARTHROSCOPY WITH SUBACROMIAL DECOMPRESSION Right 09/06/2019   Procedure: RIGHT SHOULDER ARTHROSCOPY WITH EXTENSIVE DEBRIDEMENT, SUBACROMIAL DECOMPRESSION, BICEPS TENOTOMY;  Surgeon: Jerri Kay HERO, MD;  Location: [facility];  Service: Orthopedics;  Laterality: Right;   TENOTOMY ACHILLES TENDON  2007   In [place], [place]   ULNAR NERVE TRANSPOSITION  2006   In [place], [place]   Patient Active Problem List   Diagnosis Date Noted   Injection site reaction 03/14/2024   Hypertension    PONV (postoperative nausea and vomiting)    Preop cardiovascular exam 10/18/2023   Pernicious anemia    Hallux rigidus, right foot 09/15/2021   Spondylolisthesis at L5-S1 level 05/09/2021   Low back pain at multiple sites 02/05/2021   Osteoarthritis    Heart murmur    Exercise-induced asthma    Diabetes mellitus without complication (HCC)    Arthritis    Anemia    Myofascial pain syndrome, cervical 07/28/2019   Lateral epicondylitis of right elbow 07/28/2019   Nontraumatic incomplete tear of right rotator cuff 07/25/2019   Tendinopathy of right biceps tendon 07/25/2019   Erectile dysfunction 07/11/2019   Mild persistent asthma/cough variant asthma 04/13/2019   Allergic rhinitis with a possible nonallergic component 04/13/2019   Acid reflux 04/13/2019   Chronic cough 04/13/2019   Hoarseness 04/13/2019   Cervical radicular pain 01/10/2019   Ulnar neuropathy at elbow of right upper extremity 01/03/2019   Acute bilateral low back pain without sciatica 01/03/2019   Neck pain 06/01/2018   Occipital headache 06/01/2018   Onychomycosis 05/25/2018   Palpitations  07/06/2017   CAD (coronary artery disease) 07/05/2017   Family history of early CAD 07/05/2017   Abnormal findings on diagnostic imaging of cardiovascular system 06/23/2017   Low vitamin D  level 04/22/2017   Pain in right ankle and joints of right foot 10/05/2016   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   Hemarthrosis of right elbow 02/03/2016   Small thenar eminence 02/03/2016   Cervical disc disorder with radiculopathy of cervical region 10/07/2015   Incomplete rotator cuff tear 09/10/2015   Nonischemic cardiomyopathy (HCC) 05/15/2015   Fatigue 05/15/2015   Dyspnea on exertion 05/15/2015   Decreased cardiac ejection fraction 04/02/2015   Strain of latissimus dorsi muscle 01/21/2015   Shoulder bursitis 10/12/2014   Spinal stenosis, lumbar region, without neurogenic claudication 10/12/2014   BPPV (benign paroxysmal positional vertigo) 08/14/2014   Hyperlipidemia 01/19/2014   Anemia, unspecified 01/19/2014   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   Type 2 diabetes mellitus with hyperglycemia, without long-term current use of insulin  (HCC) 12/22/2013   Hypogonadism in male 12/22/2013   Arthritis of hand, left 12/22/2013    PCP: Frann Mabel Mt, DO  REFERRING PROVIDER: Colon Shove, MD  REFERRING DIAG: Spondylolisthesis, lumbosacral region [  M43.17]   Rationale for Evaluation and Treatment: Rehabilitation  THERAPY DIAG:  Chronic bilateral low back pain with right-sided sciatica  Muscle weakness (generalized)  Other symptoms and signs involving the musculoskeletal system  ONSET DATE: 10/25/23 surgery date.  SUBJECTIVE:                                                                                                                                                                                           SUBJECTIVE STATEMENT: .   Pt reports that the trunk rotation exercise continues to cause some pain.  He likes the half kneeling wood chop and reports  some relief of tightness with this exercise.      PERTINENT HISTORY:  Cardiac, Acid reflux, Anemia, Arthritis, ADD, BPPV, CAD, DM, Dyspnea on exertion, Osteoarthritis.  R shoulder arthroscopy with subacromial decompression and biceps tenotomy 05/21 L knee arthroscopy for meniscus tear R achilles repair in 2007   PAIN:  Are you having pain? Yes: NPRS scale: 8/10  Pain location: central lumbar to bilat sides of lower back down bilat LE's to post knees Pain description: Achy pain, sharp pain Aggravating factors: twisting, walking on uneven surfaces.  Relieving factors: Resting.   PRECAUTIONS: None  RED FLAGS: None   WEIGHT BEARING RESTRICTIONS: No  FALLS:  Has patient fallen in last 6 months? No  LIVING ENVIRONMENT: Lives with: lives with their family Lives in: House/apartment Stairs: 24 stairs internally, 3 externally.  Has following equipment at home: None  OCCUPATION: Retired   PLOF: Independent  PATIENT GOALS: Pt would like to get back to walking.   NEXT MD VISIT: None currently.   OBJECTIVE:  Note: Objective measures were completed at Evaluation unless otherwise noted.  DIAGNOSTIC FINDINGS:  1. Status post L4-S1 discectomy and fusion without evidence of complication. The central canal and foramina appear widely patent at the surgical levels. 2. Mild to moderate central canal narrowing at L2-3 and L3-4. 3. Aortic atherosclerosis.   Aortic Atherosclerosis (ICD10-I70.0).  PATIENT SURVEYS:  Oswestry: 21/50 04/03/24: 18/50  COGNITION: Overall cognitive status: Within functional limits for tasks assessed     SENSATION: WFL   POSTURE: rounded shoulders and forward head  PALPATION: Tenderness across lower back.   LUMBAR ROM:   AROM eval 04/03/24 04/25/24  Flexion 15%* hip flexion mostly.     Extension 10%  No change no pain   Right lateral flexion Pt denies movement due to pain 90% limited pain 75% limited pain  Left lateral flexion Pt denies  movement due to pain 75% limited no pain 75% limited pain  Right rotation Pt denies movement due to pain Full no pain  Left rotation Pt denies movement due to pain Full no pain    (Blank rows = not tested)  LOWER EXTREMITY ROM:     Active  Right eval Left eval R / L 04/03/24  Hip flexion WFL with reports of pain in lower back  [facility] with reports of pain in lower back  Full no pain  Hip extension     Hip abduction     Hip adduction     Hip internal rotation     Hip external rotation [facility]   Knee flexion [facility]  [facility]    Knee extension WFL with reports of pain in lower back [facility] with reports of pain in lower back No pain   (Blank rows = not tested)  LOWER EXTREMITY MMT:    MMT Right eval Left eval R / L 12/23  Hip flexion     Hip extension     Hip abduction     Hip adduction     Hip internal rotation     Hip external rotation 4 4 5- cramp right hip flex  Knee flexion     Knee extension      (Blank rows = not tested)   FUNCTIONAL TESTS:  5 times sit to stand: 26.22 seconds.  04/03/24: 5x STS -18.58  04/19/24:  R SLS 5 sec, L SLS 3 sec 04/25/24: R SLS 6 sec, L SLS 4s  GAIT: Distance walked: 76ft   Assistive device utilized: None Level of assistance: Complete Independence Comments: Pt walks with a slow antalgic gait. Minimal rotation.  04/03/24: improved cadence, offloading left/antalgic  TREATMENT DATE:  05/25/23 -NuStep, UE/LE Level 5, x 5 minute, therapist present to discuss progress - STS from NuSTep seat x 10 with 4 sec lowering, cues for TrA set, forward arm reach, neutral head - half kneeling with wood chop holding ball x 8 each side (SBA for safety) - quadruped gentle cat/cow and wag tail  - Supine LTR to tolerance (rocking), arms in T - Arm/LE lengthener with core engaged during transitions, x 5 each side - supine clams with green band x 10 - single shoulder flexion slide up wall for stretch  - L stretch at sink - open book at wall x 3 reps each side -  seated piriformis stretch pulling knee to opposite shoulder    05/17/23 -NuStep, UE/LE Level 5, x 8 minute, therapist present to discuss progress - STS from NuSTep seat x 10 with 4 sec lowering, cues for TrA set, forward arm reach, neutral head - half kneeling with wood chop holding ball x 8 each side (SBA for safety) - Supine marching with TrA set 2x10 (cues for form, to retain neutral lumbar curve) - Supine LTR to tolerance - Hook lying piriformis stretch pulling knee to opposite shoulder  - L stretch at sink x 15sec x 2  1/6 Nustep lvl 3 x 5 mins  PT educated pt in correct performance of TrA and pt performed with cuing Supine marching with TrA x15 reps Supine clams with RTB with TrA 2x10 Supine piriformis stretch 2x20-30 sec  Manual Therapy:  Pt received STM and TPR to R sided lumbar paraspinals and R glute in L S/L'ing with pillow b/w knees  PT answered pt's questions.    12/29  Reviewed pt presentation, pain levels, response to prior treatment, and PMHx. PT educated pt in correct performance of TrA Supine bridge with TrA x 10 reps Supine marching with TrA Supine clams with RTB with TrA x  10 S/L clams   Updated HEP and gave pt a HEP handout.  PT educated pt in correct form and appropriate frequency.  PT instructed pt he should not have pain with HEP.   STM to bilat ITB and vastus lateralis in supine with knees over foam roll     [facility] Adult PT Treatment:                                             Date: 04/25/24  Testing for PN/re-cert: berg; 69d STS; MMT; ROM  Pt seen for aquatic therapy today.  Treatment took place in water 3.5-4.75 ft in depth at the [facility] pool. Temp of water was 91.  Pt entered/exited the pool via stairs independently with bil rail.  - unsupported walking forward/ backward with arm swing - side step into wide squat with arm add/abdct with yellow hand floats - suitcase carry with bil yellow under water walking forward backward -  wood chop motion with UE (no floats) x 10 each direction - mod cues for form - wall push up/off 2x 10 - gentle trunk rotation - decompression position with yellow noodle under arms behind back and noodle under knees    PATIENT EDUCATION:  Education details:  exercise progression/ modifications Person educated: Patient Education method: Explanation, Demonstration, verbal and tactile cues Education comprehension: verbalized understanding and returned demonstration, verbal and tactile cues required  HOME EXERCISE PROGRAM: Access Code: [email or web address] URL: [email or web address]     ASSESSMENT:  CLINICAL IMPRESSION:  No report of increase in lower back pain with NuStep; only reports of brief increase in pain when attempting too much trunk extension or flexion.  Pt required minor instruction and cuing for correct form and positioning of exercises performed today.  He tolerated exercises well with report of reduction of pain to 3/10 by end of session.  Pt will benefit from cont skilled PT to address ongoing goals and impairments and to improve overall function. Issued green band for supine clams.  Encouraged pt to complete aquatic HEP at pool ; pt verbalized understanding. Will plan to work on SLS next visit.      OBJECTIVE IMPAIRMENTS: decreased activity tolerance, difficulty walking, decreased balance, decreased endurance, decreased mobility, decreased ROM, decreased strength, impaired flexibility, impaired UE/LE use, postural dysfunction, and pain.  ACTIVITY LIMITATIONS: bending, lifting, carry, locomotion, cleaning, community activity, driving, and or occupation  PERSONAL FACTORS: Cardiac, Acid reflux, Anemia, Arthritis, ADD, BPPV, CAD, DM, Dyspnea on exertion, Osteoarthritis.  are also affecting patient's functional outcome.  REHAB POTENTIAL: Good  CLINICAL DECISION MAKING: Evolving/moderate complexity  EVALUATION COMPLEXITY: Moderate    GOALS: Short term PT Goals  Target date: 03/06/24 Pt will be I and compliant with HEP. Baseline:  Goal status: Met 03/24/24 Pt will decrease pain by 25% overall Baseline: Goal status: Met 03/24/24  Long term PT goals Target date: 06/23/24  Pt will improve ROM to [facility] to improve functional mobility Baseline: Goal status: Improving 04/03/24; in progress 04/25/24 Pt will improve hip/knee strength to at least 5-/5 MMT to improve functional strength Baseline: Goal status: In Progress 04/25/24 Pt will improve OSWESTRY to at least 13% functional to show improved function Baseline:21/50=42%; 16/50=32% Goal status: In Progress 04/25/24 Pt will reduce pain by overall 50% overall with usual activity Baseline: Goal status: MET 04/03/24 Pt will reduce pain to overall less than 3-4/10 with usual activity and work  activity. Baseline: Goal status: In progress  04/25/24 Pt will be able to ambulate on uneven surfaces at least 500 ft WNL gait pattern without complaints Baseline: Goal status: In progress 04/03/24; Met 04/25/24   7. Pt will be able to perform SLS for 30 seconds on each leg without LOB.  Baseline: 3 sec Lt, 5 sec Rt  Goal status: In progress - 04/25/24    8.pt will be indep with body mechanics for lifting  Baseline: apprehensive to try  Goal Status: NEW  PLAN: PT FREQUENCY: 1-3 times per week   PT DURATION: 6-8 weeks  PLANNED INTERVENTIONS (unless contraindicated): aquatic PT, Canalith repositioning, cryotherapy, Electrical stimulation, Iontophoresis with 4 mg/ml dexamethasome, Moist heat, traction, Ultrasound, gait training, Therapeutic exercise, balance training, neuromuscular re-education, patient/family education, prosthetic training, manual techniques, passive ROM, dry needling, taping, vasopnuematic device, vestibular, spinal manipulations, joint manipulations  PLAN FOR NEXT SESSION: Assess HEP/update PRN, continue to progress functional mobility, work on balance, work on designer, jewellery.   Core control.   Delon Aquas, PTA 05/24/24 5:40 PM [facility] [address] [place], [place], [address] Phone: [phone]   Fax:  [phone]  "

## 2024-05-29 ENCOUNTER — Other Ambulatory Visit (HOSPITAL_COMMUNITY): Payer: Self-pay

## 2024-05-31 ENCOUNTER — Telehealth (HOSPITAL_BASED_OUTPATIENT_CLINIC_OR_DEPARTMENT_OTHER): Payer: Self-pay | Admitting: *Deleted

## 2024-05-31 ENCOUNTER — Encounter: Payer: Self-pay | Admitting: Gastroenterology

## 2024-05-31 ENCOUNTER — Other Ambulatory Visit

## 2024-05-31 ENCOUNTER — Other Ambulatory Visit (HOSPITAL_BASED_OUTPATIENT_CLINIC_OR_DEPARTMENT_OTHER): Payer: Self-pay

## 2024-05-31 ENCOUNTER — Ambulatory Visit: Admitting: Gastroenterology

## 2024-05-31 ENCOUNTER — Telehealth: Payer: Self-pay

## 2024-05-31 VITALS — BP 130/70 | HR 68 | Ht 70.0 in | Wt 187.0 lb

## 2024-05-31 DIAGNOSIS — D500 Iron deficiency anemia secondary to blood loss (chronic): Secondary | ICD-10-CM | POA: Diagnosis not present

## 2024-05-31 DIAGNOSIS — K5909 Other constipation: Secondary | ICD-10-CM | POA: Diagnosis not present

## 2024-05-31 DIAGNOSIS — R195 Other fecal abnormalities: Secondary | ICD-10-CM | POA: Diagnosis not present

## 2024-05-31 DIAGNOSIS — E559 Vitamin D deficiency, unspecified: Secondary | ICD-10-CM

## 2024-05-31 DIAGNOSIS — K219 Gastro-esophageal reflux disease without esophagitis: Secondary | ICD-10-CM | POA: Diagnosis not present

## 2024-05-31 LAB — CBC WITH DIFFERENTIAL/PLATELET
Basophils Absolute: 0 10*3/uL (ref 0.0–0.1)
Basophils Relative: 0.6 % (ref 0.0–3.0)
Eosinophils Absolute: 0.1 10*3/uL (ref 0.0–0.7)
Eosinophils Relative: 1.5 % (ref 0.0–5.0)
HCT: 32.4 % — ABNORMAL LOW (ref 39.0–52.0)
Hemoglobin: 10.4 g/dL — ABNORMAL LOW (ref 13.0–17.0)
Lymphocytes Relative: 35.4 % (ref 12.0–46.0)
Lymphs Abs: 1.8 10*3/uL (ref 0.7–4.0)
MCHC: 32.2 g/dL (ref 30.0–36.0)
MCV: 84.1 fl (ref 78.0–100.0)
Monocytes Absolute: 0.5 10*3/uL (ref 0.1–1.0)
Monocytes Relative: 10.4 % (ref 3.0–12.0)
Neutro Abs: 2.6 10*3/uL (ref 1.4–7.7)
Neutrophils Relative %: 52.1 % (ref 43.0–77.0)
Platelets: 273 10*3/uL (ref 150.0–400.0)
RBC: 3.85 Mil/uL — ABNORMAL LOW (ref 4.22–5.81)
RDW: 20.9 % — ABNORMAL HIGH (ref 11.5–15.5)
WBC: 5 10*3/uL (ref 4.0–10.5)

## 2024-05-31 LAB — IBC + FERRITIN
Ferritin: 5.7 ng/mL — ABNORMAL LOW (ref 22.0–322.0)
Iron: 130 ug/dL (ref 42–165)
Saturation Ratios: 23.5 % (ref 20.0–50.0)
TIBC: 553 ug/dL — ABNORMAL HIGH (ref 250.0–450.0)
Transferrin: 395 mg/dL — ABNORMAL HIGH (ref 212.0–360.0)

## 2024-05-31 MED ORDER — PEG 3350-KCL-NABCB-NACL-NASULF 236 G PO SOLR
4000.0000 mL | Freq: Once | ORAL | 0 refills | Status: AC
  Filled 2024-05-31: qty 4000, 1d supply, fill #0

## 2024-05-31 NOTE — Telephone Encounter (Signed)
" ° °  Name: Reginald Rios  DOB: 1955/08/15  MRN: [id number]  Primary Cardiologist: Redell Leiter, MD   Preoperative team, please contact this patient and set up a phone call appointment for further preoperative risk assessment. Please obtain consent and complete medication review. Thank you for your help.  I confirm that guidance regarding antiplatelet and oral anticoagulation therapy has been completed and, if necessary, noted below. - Okay to hold Plavix  per office protocol.   I also confirmed the patient resides in the state of [place] . As per [facility] Medical Board telemedicine laws, the patient must reside in the state in which the provider is licensed.   Reginald Lepp E Kashmere Staffa, PA-C 05/31/2024, 1:12 PM [facility]    "

## 2024-05-31 NOTE — Progress Notes (Signed)
 "     [facility] Consult Note:  History: Reginald Rios 05/31/2024  Referring provider: Frann Mabel Mt, DO  Reason for consult/chief complaint: Anemia (Pt. Is here today to discuss a postive stool test. Pt. States he does stay constipated. ) and Colonoscopy (Last colonoscopy 2015. Here to discuss getting one.)   Subjective  Prior history:  Normal screening colonoscopy with Dr. Teressa November 2015   Discussed the use of AI scribe software for clinical note transcription with the patient, who gave verbal consent to proceed.  History of Present Illness Reginald Rios is a 69 year old male with iron deficiency anemia and vitamin B12 deficiency who presents for evaluation of anemia and microscopic blood in the stool.  Iron Deficiency Anemia and Vitamin B12 Deficiency: - Anemia first identified last spring - Laboratory studies revealed low vitamin B12 levels - Initiated monthly vitamin B12 injections; levels continued to decline - Switched to injections every two weeks, resulting in improved levels - Currently receiving monthly vitamin B12 injections - Iron studies a couple of months ago showed low iron levels - Started daily over-the-counter iron supplementation within the last couple of months  Microscopic Blood in Stool: - Microscopic blood detected in stool - No visible blood observed with bowel movements - Colonoscopy in November 2015 was unremarkable  Altered Bowel Habits and Chronic Constipation: - Difficulty and straining with bowel movements present since before back surgery in June of last year - Bowel movements typically occur every other day (has been pattern for many years) - Frequency and consistency dependent on diet - No use of stool softeners or laxatives - Intermittent lower abdominal cramping and pain, attributed to dietary factors - Bowel habits described as inconsistent and slow  Gastroesophageal Reflux Disease: - Present for  approximately a year and a half - Managed with daily medication - Reflux symptoms and episodes of near-vomiting after eating occur - Symptoms generally controlled with medication - No dysphagia, food impaction, nausea, vomiting, or loss of appetite    ROS:  Review of Systems  Constitutional:  Negative for appetite change and unexpected weight change.  HENT:  Negative for mouth sores and voice change.   Eyes:  Negative for pain and redness.  Respiratory:  Negative for cough and shortness of breath.   Cardiovascular:  Negative for chest pain and palpitations.  Genitourinary:  Negative for dysuria and hematuria.  Musculoskeletal:  Positive for arthralgias and back pain. Negative for myalgias.  Skin:  Negative for pallor and rash.  Neurological:  Negative for weakness and headaches.  Hematological:  Negative for adenopathy.     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 No issues now    Hemarthrosis of right elbow 02/03/2016   Hoarseness 04/13/2019   Hyperlipidemia    family hx of high cholesterol   Hypertension    Hypogonadism in male    Incomplete rotator cuff tear 09/10/2015   Injected 09/10/2015   Lateral epicondylitis of right elbow 07/28/2019   Low back pain at multiple sites 02/05/2021   Low vitamin D  level 04/22/2017   Mild persistent asthma/cough variant asthma 04/13/2019   Myofascial pain syndrome, cervical 07/28/2019   Neck pain 06/01/2018   Nonischemic cardiomyopathy (HCC) 05/15/2015   Nontraumatic incomplete tear of right rotator cuff 07/25/2019   Occipital headache 06/01/2018   Onychomycosis 05/25/2018   Osteoarthritis    Pain in right ankle and joints of right foot 10/05/2016   Palpitations 07/06/2017   Pernicious anemia    PONV (postoperative nausea and vomiting)    Preop cardiovascular exam 10/18/2023   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   Shoulder bursitis 10/12/2014   Injected in 10/12/2014  Repeat 01/21/2015   Small thenar eminence 02/03/2016   Spinal stenosis, lumbar region, without neurogenic claudication 10/12/2014   Spondylolisthesis at L5-S1 level 05/09/2021   Strain of latissimus dorsi muscle 01/21/2015   Tendinopathy of right biceps tendon 07/25/2019   Type 2 diabetes mellitus with hyperglycemia, without long-term current use of insulin  (HCC) 12/22/2013   Ulnar neuropathy at elbow of right upper extremity 01/03/2019   From cardiology office visit 01/19/2024: Reginald Rios 69 year old male with history of  CAD with prior PCI of LCx February 2019, prior stress echocardiogram June 2023 with good exercise capacity and no ischemia, with progressive symptoms of shortness of breath concerning for unstable angina underwent cardiac catheterization 07-14-2023 that noted mid LAD 40 to 50% disease unchanged from prior cath in 2019, patent LCx stents, 40% proximal OM 2 disease unchanged from prior cath, very distal L PDA 90% stenosis in a small caliber  vessel which was once again unchanged and overall findings were felt to be stable in comparison to prior.  Continued medical therapy recommended.  Echocardiogram July 13, 2023 noted normal biventricular function with EF 60 to 65% and normal diastolic function, mild tricuspid regurgitation. He also has history of diabetes mellitus type 2, hypertension, hyperlipidemia, sinus bradycardia, vertigo, chronic back pain, asthma, s/p lumbosacral fusion surgery 10/25/2023.[name]   No testing or changes in treatment at that visit.  1 year cardiology follow-up planned  Past Surgical History: Past Surgical History:  Procedure Laterality Date   CATARACT EXTRACTION W/ INTRAOCULAR LENS IMPLANT Bilateral 2023   COLONOSCOPY     CORONARY PRESSURE/FFR STUDY N/A 06/23/2017   Procedure: INTRAVASCULAR PRESSURE WIRE/FFR STUDY;  Surgeon: Anner Alm ORN, MD;  Location: [facility] INVASIVE CV LAB;  Service: Cardiovascular;  Laterality: N/A;   CORONARY STENT INTERVENTION N/A 06/23/2017   Procedure: CORONARY STENT INTERVENTION;  Surgeon: Anner Alm ORN, MD;  Location: [facility] INVASIVE CV LAB;  Service: Cardiovascular;  Laterality: N/A;   KNEE ARTHROSCOPY Left    LEFT HEART CATH AND CORONARY ANGIOGRAPHY N/A 06/23/2017   Procedure: LEFT HEART CATH AND CORONARY ANGIOGRAPHY;  Surgeon: Anner Alm ORN, MD;  Location: [facility] INVASIVE CV LAB;  Service: Cardiovascular;  Laterality: N/A;   LEFT HEART CATH AND CORONARY ANGIOGRAPHY N/A 07/14/2023   Procedure: LEFT HEART CATH AND CORONARY ANGIOGRAPHY;  Surgeon: Elmira Newman PARAS, MD;  Location: [facility] INVASIVE CV LAB;  Service:  Cardiovascular;  Laterality: N/A;   SHOULDER ARTHROSCOPY WITH BICEPSTENOTOMY Right 09/06/2019   Procedure: SHOULDER ARTHROSCOPY WITH BICEPSTENOTOMY;  Surgeon: Jerri Kay HERO, MD;  Location: [facility];  Service: Orthopedics;  Laterality: Right;   SHOULDER ARTHROSCOPY WITH SUBACROMIAL DECOMPRESSION Right 09/06/2019   Procedure: RIGHT SHOULDER ARTHROSCOPY WITH  EXTENSIVE DEBRIDEMENT, SUBACROMIAL DECOMPRESSION, BICEPS TENOTOMY;  Surgeon: Jerri Kay HERO, MD;  Location: [facility];  Service: Orthopedics;  Laterality: Right;   TENOTOMY ACHILLES TENDON  2007   In [place], [place]   ULNAR NERVE TRANSPOSITION  2006   In [place], [place]     Family History: Family History  Problem Relation Age of Onset   Lung cancer Mother 35   Cancer Mother        lung cancer   Coronary artery disease Father 71   Heart disease Father    Hypothyroidism Brother    Food Allergy Son    Asthma Paternal Aunt    Colon cancer Neg Hx    Prostate cancer Neg Hx    Allergic rhinitis Neg Hx    Angioedema Neg Hx    Eczema Neg Hx    Immunodeficiency Neg Hx    Urticaria Neg Hx     Social History: Social History   Socioeconomic History   Marital status: Married    Spouse name: Not on file   Number of children: Not on file   Years of education: Not on file   Highest education level: Some college, no degree  Occupational History   Occupation: conservation officer, historic buildings   Tobacco Use   Smoking status: Never    Passive exposure: Never   Smokeless tobacco: Never  Vaping Use   Vaping status: Never Used  Substance and Sexual Activity   Alcohol use: Not Currently    Comment: 1-2 drinks/monthly   Drug use: No   Sexual activity: Yes  Other Topics Concern   Not on file  Social History Narrative   Previously worked as environmental manager   Currently working at [employer]   Married 30 years   2 children ages 32, 30   Wife is psychiatrist - Dr. Adrien Gull   Moved from [place]   Grew up in [place]   Social Drivers of Health   Tobacco Use: Low Risk (05/31/2024)   Patient History    Smoking Tobacco Use: Never    Smokeless Tobacco Use: Never    Passive Exposure: Never  Financial Resource Strain: Low Risk (03/14/2024)   Overall Financial Resource Strain (CARDIA)    Difficulty of Paying Living Expenses: Not hard at all  Food Insecurity: No Food Insecurity  (03/14/2024)   Epic    Worried About Programme Researcher, Broadcasting/film/video in the Last Year: Never true    Ran Out of Food in the Last Year: Never true  Transportation Needs: No Transportation Needs (03/14/2024)   Epic    Lack of Transportation (Medical): No    Lack of Transportation (Non-Medical): No  Physical Activity: Insufficiently Active (03/14/2024)   Exercise Vital Sign    Days of Exercise per Week: 7 days    Minutes of Exercise per Session: 20 min  Stress: No Stress Concern Present (03/14/2024)   [employer] of Occupational Health - Occupational Stress Questionnaire    Feeling of Stress: Not at all  Social Connections: Moderately Integrated (03/14/2024)   Social Connection and Isolation Panel    Frequency of Communication with Friends and Family: More than three times a week    Frequency  of Social Gatherings with Friends and Family: Three times a week    Attends Religious Services: Never    Active Member of Clubs or Organizations: Yes    Attends Banker Meetings: 1 to 4 times per year    Marital Status: Married  Recent Concern: Social Connections - Moderately Isolated (12/17/2023)   Social Connection and Isolation Panel    Frequency of Communication with Friends and Family: More than three times a week    Frequency of Social Gatherings with Friends and Family: Three times a week    Attends Religious Services: Never    Active Member of Clubs or Organizations: No    Attends Banker Meetings: Not on file    Marital Status: Married  Depression (PHQ2-9): Low Risk (02/08/2024)   Depression (PHQ2-9)    PHQ-2 Score: 4  Alcohol Screen: Low Risk (03/14/2024)   Alcohol Screen    Last Alcohol Screening Score (AUDIT): 1  Housing: Unknown (03/14/2024)   Epic    Unable to Pay for Housing in the Last Year: No    Number of Times Moved in the Last Year: Not on file    Homeless in the Last Year: No  Utilities: Not on file  Health Literacy: Not on file     Allergies: Allergies[1]  Outpatient Meds: Current Outpatient Medications  Medication Sig Dispense Refill   acetaminophen  (TYLENOL ) 500 MG tablet Take 1,000 mg by mouth every 6 (six) hours as needed for moderate pain (pain score 4-6).     albuterol  (VENTOLIN  HFA) 108 (90 Base) MCG/ACT inhaler Inhale 2 puffs into the lungs every 4 (four) to 6 (six) hours as needed for coughing or wheezing spells. 6.7 g 1   amLODipine  (NORVASC ) 2.5 MG tablet Take 1 tablet (2.5 mg total) by mouth daily. 90 tablet 3   azelastine  (ASTELIN ) 0.1 % nasal spray Place 2 sprays into both nostrils 2 (two) times daily as needed for rhinitis (nasal congestion). (Patient taking differently: Place 2 sprays into both nostrils at bedtime.) 30 mL 3   baclofen  (LIORESAL ) 10 MG tablet Take 1 tablet (10 mg total) by mouth every 6 (six) hours as needed for spasm. 60 tablet 4   Blood Glucose Monitoring Suppl (FREESTYLE FREEDOM LITE) w/Device KIT Use to check blood sugar daily. 1 kit 0   Clobetasol  Propionate 0.05 % shampoo Apply 1 Application topically every 2 days, leave on 5 minutes before rinsing. 118 mL 1   clopidogrel  (PLAVIX ) 75 MG tablet Take 1 tablet (75 mg total) by mouth daily. 90 tablet 2   Continuous Glucose Sensor (FREESTYLE LIBRE 3 PLUS SENSOR) MISC Change sensor every 15 days. 6 each 3   diclofenac  Sodium (VOLTAREN ) 1 % GEL Apply 2 g topically 4 (four) times daily. (Patient taking differently: Apply 2 g topically 4 (four) times daily as needed (pain).) 100 g 0   empagliflozin  (JARDIANCE ) 25 MG TABS tablet Take 1 tablet (25 mg total) by mouth daily before breakfast. 90 tablet 1   ezetimibe  (ZETIA ) 10 MG tablet Take 1 tablet (10 mg total) by mouth daily. 90 tablet 3   fluocinonide  cream (LIDEX ) 0.05 % Apply 1 Application topically 2 times daily to neck, back and ears as needed for itching. 60 g 1   fluticasone  (FLONASE ) 50 MCG/ACT nasal spray Place 2 sprays into both nostrils daily. 16 g 6   gabapentin  (NEURONTIN ) 100  MG capsule Take 1 capsule (100 mg total) by mouth 3 (three) times daily as needed. 90 capsule  3   glipiZIDE  2.5 MG TABS Take 1 tablet (2.5 mg dose) by mouth as needed (as needed for blood sugar more than 220). 90 tablet 1   Glucagon  (GVOKE HYPOPEN  1-PACK) 1 MG/0.2ML SOAJ Inject into the skin in the event of low sugar. 0.2 mL 2   glucose blood (FREESTYLE LITE) test strip Use daily to check blood sugar. 100 each 3   glucose blood (FREESTYLE LITE) test strip Use daily to check blood sugar. 100 each 3   glyBURIDE  (DIABETA ) 2.5 MG tablet Take 1 tablet (2.5 mg total) by mouth daily with breakfast. 30 tablet 2   ketoconazole  (NIZORAL ) 2 % shampoo Apply 1 Application topically 2 (two) times a week. Leave on for 5 min before rinsing. 120 mL 0   levocetirizine (XYZAL ) 5 MG tablet Take 1 tablet (5 mg total) by mouth every evening. 90 tablet 1   lidocaine  (LIDODERM ) 5 % Apply 1 patch by transdermal route once a day (May wear up to 12hours). 30 patch 6   losartan  (COZAAR ) 25 MG tablet Take 1 tablet (25 mg total) by mouth daily. 90 tablet 2   meclizine  (ANTIVERT ) 25 MG tablet Take 1 tablet (25 mg total) by mouth 3 (three) times daily as needed for dizziness. 30 tablet 0   metFORMIN  (GLUCOPHAGE -XR) 500 MG 24 hr tablet Take 2 tablets (1,000 mg total) by mouth 2 (two) times daily with a meal. 120 tablet 5   methocarbamol  (ROBAXIN ) 500 MG tablet Take 1 tablet (500 mg total) by mouth every 6 (six) hours as needed for muscle spasms. 30 tablet 3   methylphenidate  (RITALIN ) 20 MG tablet Take 1 tablet (20 mg total) by mouth 2 (two) times daily. 60 tablet 0   methylphenidate  (RITALIN ) 20 MG tablet Take 1 tablet (20 mg total) by mouth 2 (two) times daily. 60 tablet 0   methylphenidate  (RITALIN ) 20 MG tablet Take 1 tablet (20 mg total) by mouth 2 (two) times daily. 60 tablet 0   montelukast  (SINGULAIR ) 10 MG tablet Take 1 tablet (10 mg total) by mouth daily. 90 tablet 3   Multiple Vitamin (MULTIVITAMIN WITH MINERALS) TABS  tablet Take 1 tablet by mouth in the morning. One A Day     nitroGLYCERIN  (NITROSTAT ) 0.4 MG SL tablet Place 0.4 mg under the tongue every 5 (five) minutes x 3 doses as needed for chest pain.     pantoprazole  (PROTONIX ) 40 MG tablet Take 1 tablet (40 mg total) by mouth daily. 90 tablet 0   pioglitazone  (ACTOS ) 45 MG tablet Take 1 tablet (45 mg total) by mouth daily. 90 tablet 1   polyethylene glycol (GOLYTELY ) 236 g solution Take 4,000 mLs by mouth once for 1 dose. 4000 mL 0   Probiotic Product (PROBIOTIC PO) Take 2 capsules by mouth in the morning.     rosuvastatin  (CRESTOR ) 5 MG tablet Take 1 tablet (5 mg total) by mouth daily. 90 tablet 1   sildenafil  (VIAGRA ) 100 MG tablet Take 1 tablet (100 mg total) by mouth daily as needed. Take 1 hour prior to sexual activity 20 tablet 3   testosterone  (ANDROGEL ) 50 MG/5GM (1%) GEL Apply 2 applications onto the skin daily. 300 g 5   Vitamin D , Ergocalciferol , (DRISDOL ) 1.25 MG (50000 UNIT) CAPS capsule Take 1 capsule (50,000 Units total) by mouth every 7 (seven) days. 12 capsule 1   No current facility-administered medications for this visit.      ___________________________________________________________________ Objective   Exam:  BP 130/70   Pulse  68   Ht 5' 10 (1.778 m)   Wt 187 lb (84.8 kg)   SpO2 98%   BMI 26.83 kg/m  Wt Readings from Last 3 Encounters:  05/31/24 187 lb (84.8 kg)  04/18/24 182 lb (82.6 kg)  03/14/24 181 lb 1.9 oz (82.2 kg)    General: well-appearing  Eyes: sclera anicteric, no redness ENT: oral mucosa moist without lesions, no cervical or supraclavicular lymphadenopathy CV: regular without appreciable murmur, no JVD, mild b/l pretibial edema Resp: clear to auscultation bilaterally, normal RR and effort noted GI: soft, no tenderness, with active bowel sounds. No guarding or palpable organomegaly noted. Skin; warm and dry, no rash or jaundice noted Neuro: awake, alert and oriented x 3. Normal gross motor function  and fluent speech   Labs:     Latest Ref Rng & Units 03/27/2024    9:42 AM 02/08/2024    9:59 AM 10/26/2023    7:48 AM  CBC  WBC 4.0 - 10.5 K/uL 5.0  4.6  7.6   Hemoglobin 13.0 - 17.0 g/dL 9.2  9.8  8.4   Hematocrit 39.0 - 52.0 % 28.5  30.7  25.8   Platelets 150.0 - 400.0 K/uL 293.0  373.0  144       Latest Ref Rng & Units 04/12/2024    8:24 AM 02/08/2024    9:59 AM 10/26/2023    7:48 AM  CMP  Glucose 65 - 99 mg/dL 863  884  747   BUN 7 - 25 mg/dL 16  11  14    Creatinine 0.70 - 1.28 mg/dL 9.27  9.33  9.01   Sodium 135 - 146 mmol/L 141  142  138   Potassium 3.5 - 5.3 mmol/L 4.3  4.2  3.9   Chloride 98 - 110 mmol/L 105  104  106   CO2 20 - 32 mmol/L 26  27  21    Calcium  8.6 - 10.3 mg/dL 9.3  9.7  8.2   Total Protein 6.1 - 8.1 g/dL 7.2  7.3    Total Bilirubin 0.2 - 1.2 mg/dL 0.3  0.4    Alkaline Phos 39 - 117 U/L  84    AST 10 - 35 U/L 18  18    ALT 9 - 46 U/L 15  16     Iron/TIBC/Ferritin/ %Sat    Component Value Date/Time   IRON 38 (L) 03/28/2024 0857   TIBC 516.6 (H) 03/28/2024 0857   FERRITIN 5.4 (L) 03/28/2024 0857   IRONPCTSAT 7.4 (L) 03/28/2024 0857   IRONPCTSAT 27 01/27/2022 1235   FOBT + December 2025  B12 level low at 110 in March 2025 Normal at over 1500 in April 2025 Low at 205 in November 2025 Normal at 273 January 2026  Folate normal November 2025    Encounter Diagnoses  Name Primary?   Iron deficiency anemia due to chronic blood loss Yes   Heme positive stool    Chronic constipation     Assessment & Plan Iron deficiency anemia Chronic anemia with positive fecal occult blood, likely due to gastrointestinal blood loss or malabsorption. Etiology unclear, requires further evaluation. - Repeat CBC and iron studies. - Schedule colonoscopy and upper endoscopy. - Consider video capsule endoscopy if initial evaluations are unrevealing.  Vitamin B12 deficiency Chronic deficiency requiring ongoing parenteral supplementation, previously responsive to  increased injection frequency.  Chronic constipation Longstanding moderate constipation with intermittent cramping, not managed with pharmacologic therapy.  Gastroesophageal reflux disease Chronic, with occasional postprandial symptoms despite current  medication.  Scenario and Ddx explained along with diagram of GI anatomy.  He was agreeable to procedure after discussion of them and risks   The benefits and risks of the planned procedure(s) were described in detail with the patient or (when appropriate) their health care proxy.  Risks were outlined as including, but not limited to, bleeding, infection, perforation, adverse medication reaction leading to cardiac or pulmonary decompensation, pancreatitis (if ERCP).  The limitation of incomplete mucosal visualization was also discussed.  No guarantees or warranties were given.  Check CBC and iron studies today    Thank you for the courtesy of this consult.  Please call me with any questions or concerns.  Reginald Rios Brand III  CC: Referring provider noted above     [1]  Allergies Allergen Reactions   Invokana  [Canagliflozin ] Rash   Grass Pollen([id number] Vern)    Lisinopril  Cough   Semaglutide  Nausea Only    Causes cramping. Patient is intolerant to Rybelsus    "

## 2024-05-31 NOTE — Telephone Encounter (Signed)
 Pt has been scheduled tele preop appt 06/15/24. Med rec and consent are done.     Patient Consent for Virtual Visit        Reginald Rios has provided verbal consent on 05/31/2024 for a virtual visit (video or telephone).   CONSENT FOR VIRTUAL VISIT FOR:  Reginald Rios  By participating in this virtual visit I agree to the following:  I hereby voluntarily request, consent and authorize [facility] and its employed or contracted physicians, physician assistants, nurse practitioners or other licensed health care professionals (the Practitioner), to provide me with telemedicine health care services (the Services) as deemed necessary by the treating Practitioner. I acknowledge and consent to receive the Services by the Practitioner via telemedicine. I understand that the telemedicine visit will involve communicating with the Practitioner through live audiovisual communication technology and the disclosure of certain medical information by electronic transmission. I acknowledge that I have been given the opportunity to request an in-person assessment or other available alternative prior to the telemedicine visit and am voluntarily participating in the telemedicine visit.  I understand that I have the right to withhold or withdraw my consent to the use of telemedicine in the course of my care at any time, without affecting my right to future care or treatment, and that the Practitioner or I may terminate the telemedicine visit at any time. I understand that I have the right to inspect all information obtained and/or recorded in the course of the telemedicine visit and may receive copies of available information for a reasonable fee.  I understand that some of the potential risks of receiving the Services via telemedicine include:  Delay or interruption in medical evaluation due to technological equipment failure or disruption; Information transmitted may not be sufficient (e.g. poor  resolution of images) to allow for appropriate medical decision making by the Practitioner; and/or  In rare instances, security protocols could fail, causing a breach of personal health information.  Furthermore, I acknowledge that it is my responsibility to provide information about my medical history, conditions and care that is complete and accurate to the best of my ability. I acknowledge that Practitioner's advice, recommendations, and/or decision may be based on factors not within their control, such as incomplete or inaccurate data provided by me or distortions of diagnostic images or specimens that may result from electronic transmissions. I understand that the practice of medicine is not an exact science and that Practitioner makes no warranties or guarantees regarding treatment outcomes. I acknowledge that a copy of this consent can be made available to me via my patient portal [facility] MyChart), or I can request a printed copy by calling the office of [facility].    I understand that my insurance will be billed for this visit.   I have read or had this consent read to me. I understand the contents of this consent, which adequately explains the benefits and risks of the Services being provided via telemedicine.  I have been provided ample opportunity to ask questions regarding this consent and the Services and have had my questions answered to my satisfaction. I give my informed consent for the services to be provided through the use of telemedicine in my medical care

## 2024-05-31 NOTE — Patient Instructions (Signed)
 You will be contacted by our office prior to your procedure for directions on holding your Plavix .  If you do not hear from our office 1 week prior to your scheduled procedure, please call [phone] to discuss.    Your provider has requested that you go to the basement level for lab work before leaving today. Press B on the elevator. The lab is located at the first door on the left as you exit the elevator.   You have been scheduled for an endoscopy and colonoscopy. Please follow the written instructions given to you at your visit today.  If you use inhalers (even only as needed), please bring them with you on the day of your procedure.  DO NOT TAKE 7 DAYS PRIOR TO TEST- Trulicity (dulaglutide) Ozempic , Wegovy  (semaglutide ) Mounjaro, Zepbound (tirzepatide) Bydureon Bcise (exanatide extended release)  DO NOT TAKE 1 DAY PRIOR TO YOUR TEST Rybelsus  (semaglutide ) Adlyxin (lixisenatide) Victoza (liraglutide) Byetta (exanatide) ___________________________________________________________________________  Thank you for trusting me with your gastrointestinal care!    Dr. Victory Legrand [facility]

## 2024-05-31 NOTE — Telephone Encounter (Signed)
 [facility] Pre-operative Risk Assessment     Request for surgical clearance:     Endoscopy Procedure  What type of surgery is being performed?     ECL  When is this surgery scheduled?     07/03/24  What type of clearance is required ?   Pharmacy  Are there any medications that need to be held prior to surgery and how long? Plavix  5 days  Practice name and name of physician performing surgery?      [facility]  What is your office phone and fax number?      Phone- [phone]  Fax- [phone]  Anesthesia type (None, local, MAC, general) ?       MAC   Please route your response to Corean [place], [facility]

## 2024-05-31 NOTE — Telephone Encounter (Signed)
 Pt has been scheduled tele preop appt 06/15/24. Med rec and consent are done.

## 2024-06-01 ENCOUNTER — Ambulatory Visit: Payer: Self-pay | Admitting: Gastroenterology

## 2024-06-01 DIAGNOSIS — D500 Iron deficiency anemia secondary to blood loss (chronic): Secondary | ICD-10-CM

## 2024-06-01 NOTE — Progress Notes (Signed)
 Dr. Legrand, Preferred medication will be Venofer.  Reginald Rios

## 2024-06-02 ENCOUNTER — Encounter: Payer: Self-pay | Admitting: Gastroenterology

## 2024-06-02 ENCOUNTER — Telehealth: Payer: Self-pay | Admitting: Pharmacy Technician

## 2024-06-02 ENCOUNTER — Ambulatory Visit (HOSPITAL_BASED_OUTPATIENT_CLINIC_OR_DEPARTMENT_OTHER): Payer: Self-pay | Admitting: Physical Therapy

## 2024-06-02 DIAGNOSIS — G8929 Other chronic pain: Secondary | ICD-10-CM | POA: Diagnosis not present

## 2024-06-02 DIAGNOSIS — R29898 Other symptoms and signs involving the musculoskeletal system: Secondary | ICD-10-CM

## 2024-06-02 DIAGNOSIS — M6281 Muscle weakness (generalized): Secondary | ICD-10-CM

## 2024-06-02 DIAGNOSIS — D500 Iron deficiency anemia secondary to blood loss (chronic): Secondary | ICD-10-CM | POA: Insufficient documentation

## 2024-06-02 NOTE — Telephone Encounter (Signed)
 Auth Submission: NO AUTH NEEDED Site of care: Site of care: [facility] Payer: [employer] Medication & CPT/J Code(s) submitted: Venofer (Iron Sucrose) J1756 Diagnosis Code: D50.0 Route of submission (phone, fax, portal):  Phone # Fax # Auth type: Buy/Bill PB Units/visits requested: 5 DOSES Reference number:  Approval from: 06/02/24 to 08/31/24

## 2024-06-06 ENCOUNTER — Encounter (HOSPITAL_BASED_OUTPATIENT_CLINIC_OR_DEPARTMENT_OTHER): Payer: Self-pay | Admitting: Physical Therapy

## 2024-06-06 ENCOUNTER — Ambulatory Visit (HOSPITAL_BASED_OUTPATIENT_CLINIC_OR_DEPARTMENT_OTHER): Admitting: Physical Therapy

## 2024-06-06 DIAGNOSIS — M6281 Muscle weakness (generalized): Secondary | ICD-10-CM

## 2024-06-06 DIAGNOSIS — R29898 Other symptoms and signs involving the musculoskeletal system: Secondary | ICD-10-CM

## 2024-06-06 DIAGNOSIS — G8929 Other chronic pain: Secondary | ICD-10-CM

## 2024-06-07 ENCOUNTER — Ambulatory Visit

## 2024-06-07 ENCOUNTER — Other Ambulatory Visit (HOSPITAL_BASED_OUTPATIENT_CLINIC_OR_DEPARTMENT_OTHER): Payer: Self-pay

## 2024-06-07 DIAGNOSIS — E538 Deficiency of other specified B group vitamins: Secondary | ICD-10-CM

## 2024-06-07 MED ORDER — CYANOCOBALAMIN 1000 MCG/ML IJ SOLN
1000.0000 ug | Freq: Once | INTRAMUSCULAR | Status: AC
  Administered 2024-06-07: 1000 ug via INTRAMUSCULAR

## 2024-06-07 NOTE — Progress Notes (Signed)
 Pt here for monthly B12 injection per Dr. Frann  Last B12 injection: 05/23/2024  Last B12 level:  05/23/2024  B12 1000mcg given IM, and pt tolerated injection well.  Next B12 injection scheduled for:  07/05/2024

## 2024-06-08 ENCOUNTER — Other Ambulatory Visit (HOSPITAL_BASED_OUTPATIENT_CLINIC_OR_DEPARTMENT_OTHER): Payer: Self-pay

## 2024-06-12 ENCOUNTER — Ambulatory Visit

## 2024-06-13 ENCOUNTER — Ambulatory Visit (HOSPITAL_BASED_OUTPATIENT_CLINIC_OR_DEPARTMENT_OTHER): Payer: Self-pay | Admitting: Physical Therapy

## 2024-06-14 ENCOUNTER — Encounter (HOSPITAL_BASED_OUTPATIENT_CLINIC_OR_DEPARTMENT_OTHER): Admitting: Physical Therapy

## 2024-06-15 ENCOUNTER — Ambulatory Visit

## 2024-06-16 ENCOUNTER — Ambulatory Visit

## 2024-06-19 ENCOUNTER — Ambulatory Visit

## 2024-06-20 ENCOUNTER — Encounter (HOSPITAL_BASED_OUTPATIENT_CLINIC_OR_DEPARTMENT_OTHER): Admitting: Physical Therapy

## 2024-06-21 ENCOUNTER — Ambulatory Visit

## 2024-06-23 ENCOUNTER — Ambulatory Visit

## 2024-07-03 ENCOUNTER — Encounter: Admitting: Gastroenterology

## 2024-07-05 ENCOUNTER — Ambulatory Visit

## 2024-07-21 ENCOUNTER — Encounter: Admitting: Gastroenterology

## 2024-08-08 ENCOUNTER — Ambulatory Visit: Admitting: Family Medicine

## 2024-08-30 ENCOUNTER — Ambulatory Visit: Admitting: Internal Medicine

## 2024-09-18 ENCOUNTER — Encounter: Admitting: Dietician

## 2024-10-12 ENCOUNTER — Other Ambulatory Visit

## 2024-10-17 ENCOUNTER — Ambulatory Visit: Admitting: "Endocrinology
